# Patient Record
Sex: Male | Born: 1940
Health system: Southern US, Community
[De-identification: ages and names within clinical notes are randomized; demographics above are authoritative.]

## PROBLEM LIST (undated history)

## (undated) DIAGNOSIS — T7840XA Allergy, unspecified, initial encounter: Secondary | ICD-10-CM

## (undated) DIAGNOSIS — M1990 Unspecified osteoarthritis, unspecified site: Secondary | ICD-10-CM

## (undated) DIAGNOSIS — N400 Enlarged prostate without lower urinary tract symptoms: Secondary | ICD-10-CM

## (undated) DIAGNOSIS — K635 Polyp of colon: Secondary | ICD-10-CM

## (undated) DIAGNOSIS — K219 Gastro-esophageal reflux disease without esophagitis: Secondary | ICD-10-CM

## (undated) DIAGNOSIS — I10 Essential (primary) hypertension: Secondary | ICD-10-CM

## (undated) HISTORY — DX: Polyp of colon: K63.5

## (undated) HISTORY — DX: Unspecified osteoarthritis, unspecified site: M19.90

## (undated) HISTORY — PX: CATARACT EXTRACTION: SUR2

## (undated) HISTORY — DX: Gastro-esophageal reflux disease without esophagitis: K21.9

## (undated) HISTORY — PX: OTHER SURGICAL HISTORY: SHX169

## (undated) HISTORY — DX: Benign prostatic hyperplasia without lower urinary tract symptoms: N40.0

## (undated) HISTORY — DX: Allergy, unspecified, initial encounter: T78.40XA

---

## 2004-04-19 ENCOUNTER — Ambulatory Visit: Payer: Self-pay | Admitting: Internal Medicine

## 2004-05-01 ENCOUNTER — Ambulatory Visit: Payer: Self-pay | Admitting: Internal Medicine

## 2004-05-10 ENCOUNTER — Ambulatory Visit: Payer: Self-pay | Admitting: Internal Medicine

## 2004-06-16 ENCOUNTER — Ambulatory Visit: Payer: Self-pay | Admitting: Internal Medicine

## 2005-04-24 ENCOUNTER — Ambulatory Visit: Payer: Self-pay | Admitting: Internal Medicine

## 2006-04-09 ENCOUNTER — Ambulatory Visit: Payer: Self-pay | Admitting: Internal Medicine

## 2006-04-11 ENCOUNTER — Ambulatory Visit: Payer: Self-pay | Admitting: Internal Medicine

## 2007-04-17 ENCOUNTER — Ambulatory Visit: Payer: Self-pay | Admitting: Internal Medicine

## 2007-04-17 DIAGNOSIS — Z860100 Personal history of colon polyps, unspecified: Secondary | ICD-10-CM | POA: Insufficient documentation

## 2007-04-17 DIAGNOSIS — J301 Allergic rhinitis due to pollen: Secondary | ICD-10-CM

## 2008-04-20 ENCOUNTER — Ambulatory Visit: Payer: Self-pay | Admitting: Internal Medicine

## 2008-04-20 DIAGNOSIS — M1990 Unspecified osteoarthritis, unspecified site: Secondary | ICD-10-CM | POA: Insufficient documentation

## 2008-04-20 DIAGNOSIS — M48061 Spinal stenosis, lumbar region without neurogenic claudication: Secondary | ICD-10-CM

## 2009-03-24 ENCOUNTER — Encounter (INDEPENDENT_AMBULATORY_CARE_PROVIDER_SITE_OTHER): Payer: Self-pay | Admitting: *Deleted

## 2009-04-29 ENCOUNTER — Ambulatory Visit: Payer: Self-pay | Admitting: Internal Medicine

## 2009-05-28 HISTORY — PX: COLONOSCOPY: SHX174

## 2009-06-20 ENCOUNTER — Encounter (INDEPENDENT_AMBULATORY_CARE_PROVIDER_SITE_OTHER): Payer: Self-pay | Admitting: *Deleted

## 2009-07-06 ENCOUNTER — Encounter (INDEPENDENT_AMBULATORY_CARE_PROVIDER_SITE_OTHER): Payer: Self-pay | Admitting: *Deleted

## 2009-07-07 ENCOUNTER — Ambulatory Visit: Payer: Self-pay | Admitting: Internal Medicine

## 2009-07-18 ENCOUNTER — Telehealth: Payer: Self-pay | Admitting: Internal Medicine

## 2009-07-21 ENCOUNTER — Ambulatory Visit: Payer: Self-pay | Admitting: Internal Medicine

## 2009-07-21 LAB — HM COLONOSCOPY

## 2009-07-24 ENCOUNTER — Encounter: Payer: Self-pay | Admitting: Internal Medicine

## 2009-07-27 ENCOUNTER — Ambulatory Visit: Payer: Self-pay | Admitting: Ophthalmology

## 2010-03-16 ENCOUNTER — Encounter: Payer: Self-pay | Admitting: Internal Medicine

## 2010-05-05 ENCOUNTER — Ambulatory Visit: Payer: Self-pay | Admitting: Internal Medicine

## 2010-05-05 ENCOUNTER — Encounter: Payer: Self-pay | Admitting: Internal Medicine

## 2010-05-05 DIAGNOSIS — R079 Chest pain, unspecified: Secondary | ICD-10-CM

## 2010-05-06 LAB — CONVERTED CEMR LAB
ALT: 17 units/L (ref 0–53)
Albumin: 4 g/dL (ref 3.5–5.2)
Basophils Relative: 0.5 % (ref 0.0–3.0)
CO2: 31 meq/L (ref 19–32)
Calcium: 8.8 mg/dL (ref 8.4–10.5)
Creatinine, Ser: 0.9 mg/dL (ref 0.4–1.5)
Eosinophils Absolute: 0.1 10*3/uL (ref 0.0–0.7)
Eosinophils Relative: 1.5 % (ref 0.0–5.0)
Glucose, Bld: 95 mg/dL (ref 70–99)
HCT: 39.8 % (ref 39.0–52.0)
Lymphs Abs: 1.5 10*3/uL (ref 0.7–4.0)
MCHC: 34.3 g/dL (ref 30.0–36.0)
MCV: 100 fL (ref 78.0–100.0)
Monocytes Absolute: 0.8 10*3/uL (ref 0.1–1.0)
Neutrophils Relative %: 56.8 % (ref 43.0–77.0)
Platelets: 218 10*3/uL (ref 150.0–400.0)
Sodium: 138 meq/L (ref 135–145)
Total Protein: 7 g/dL (ref 6.0–8.3)
WBC: 5.6 10*3/uL (ref 4.5–10.5)

## 2010-06-25 LAB — CONVERTED CEMR LAB
AST: 27 units/L (ref 0–37)
Albumin: 4 g/dL (ref 3.5–5.2)
Alkaline Phosphatase: 83 units/L (ref 39–117)
BUN: 12 mg/dL (ref 6–23)
Basophils Relative: 0.7 % (ref 0.0–1.0)
Bilirubin, Direct: 0.1 mg/dL (ref 0.0–0.3)
CO2: 31 meq/L (ref 19–32)
Calcium: 9.2 mg/dL (ref 8.4–10.5)
Eosinophils Relative: 1.5 % (ref 0.0–5.0)
GFR calc Af Amer: 108 mL/min
GFR calc non Af Amer: 89 mL/min
HCT: 41.1 % (ref 39.0–52.0)
HDL: 38.5 mg/dL — ABNORMAL LOW (ref >39.0)
Hemoglobin: 14.3 g/dL (ref 13.0–17.0)
LDL Cholesterol: 127 mg/dL — ABNORMAL HIGH (ref 0–99)
Lymphocytes Relative: 29 % (ref 12.0–46.0)
Monocytes Absolute: 0.8 10*3/uL — ABNORMAL HIGH (ref 0.2–0.7)
Monocytes Relative: 15.9 % — ABNORMAL HIGH (ref 3.0–11.0)
Neutro Abs: 2.8 10*3/uL (ref 1.4–7.7)
Neutrophils Relative %: 52.9 % (ref 43.0–77.0)
PSA: 0.35 ng/mL (ref 0.10–4.00)
Phosphorus: 2.9 mg/dL (ref 2.3–4.6)
Potassium: 4.1 meq/L (ref 3.5–5.1)
RDW: 12 % (ref 11.5–14.6)
TSH: 1.13 microintl units/mL (ref 0.35–5.50)
Total Bilirubin: 0.8 mg/dL (ref 0.3–1.2)
Total Protein: 7.4 g/dL (ref 6.0–8.3)
VLDL: 20 mg/dL (ref 0–40)

## 2010-06-28 NOTE — Miscellaneous (Signed)
Summary: FLU VACCINE  Clinical Lists Changes  Observations: Added new observation of FLU VAX: Historical (02/23/2010 14:49)      Influenza Immunization History:    Influenza # 1:  Historical (02/23/2010)

## 2010-06-28 NOTE — Miscellaneous (Signed)
Summary: [facility] Previsit/prep  Clinical Lists Changes  Medications: Added new medication of MOVIPREP 100 GM  SOLR (PEG-KCL-NACL-NASULF-NA ASC-C) As per prep instructions. - Signed Rx of MOVIPREP 100 GM  SOLR (PEG-KCL-NACL-NASULF-NA ASC-C) As per prep instructions.;  #1 x 0;  Signed;  Entered by: Wyona Almas RN;  Authorized by: Iva Boop MD, [facility];  Method used: Electronically to [id number] [address], [place], [place]  [address], Ph: [phone], Fax: [phone] Observations: Added new observation of ALLERGY REV: Done (07/07/2009 13:22)    Prescriptions: MOVIPREP 100 GM  SOLR (PEG-KCL-NACL-NASULF-NA ASC-C) As per prep instructions.  #1 x 0   Entered by:   Wyona Almas RN   Authorized by:   Iva Boop MD, [facility]   Signed by:   Wyona Almas RN on 07/07/2009   Method used:   Electronically to        [id number] ([facility]       [address]       [place], [place]  [address]       Ph: [phone]       Fax: [phone]   RxID:   [id number]

## 2010-06-28 NOTE — Assessment & Plan Note (Signed)
Summary: F/U/CLE   Vital Signs:  Patient profile:   70 year old male Weight:      209 pounds BMI:     31.89 Temp:     98.7 degrees F oral Pulse rate:   72 / minute Pulse rhythm:   regular BP sitting:   150 / 80  (left arm) Cuff size:   large  Vitals Entered By: Mervin Hack CMA Duncan Dull) (May 05, 2010 9:22 AM) CC: follow-up visit   History of Present Illness: Doing fairly well  Will have intermittent back pain He relates this to standing on concrete for 8 or more hours while at work he does walk a lot while there No pain when he is walking picking vegetables Now wears good support shoes from sports store Doesn't take any meds for this--not that severe  Occ pain in arch of feet as well Occ knee pain also but no weakness  Stress dealing with 63 year old dad still lives alone He has to spend time helping him Has mild dementia  No recent allergy problems uses the loratadine in season when needed  has occ chest pain Mild and at rest has happened several times in the past year No change in chronic SOB when he gets the pain Lasts a few seconds No diaphoresis or nausea  Allergies: 1)  ! Sulfa  Past History:  Past medical, surgical, family and social histories (including risk factors) reviewed for relevance to current acute and chronic problems.  Past Medical History: Reviewed history from 04/20/2008 and no changes required. Allergic rhinitis--hay fever Colonic polyps--adenomatous Osteoarthritis  Family History: Reviewed history from 04/20/2008 and no changes required. Dad healthy in 70's Mom died @72  brain aneurysm--died 3 years after resection Mat uncle died of leukemia, another uncle had some other cancer No CAD, DM Mom had HTN No colon or prostate cancer  Social History: Reviewed history from 04/17/2007 and no changes required. 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  The patient denies syncope and abdominal pain.         weight down a little sleeps okay occ mild dizziness  Physical Exam  General:  alert and normal appearance.   Neck:  supple, no masses, no thyromegaly, no carotid bruits, and no cervical lymphadenopathy.   Lungs:  normal respiratory effort, no intercostal retractions, no accessory muscle use, and normal breath sounds.   Heart:  normal rate, regular rhythm, no murmur, and no gallop.   Abdomen:  soft, non-tender, and no masses.   Msk:  no joint tenderness and no joint swelling.   Extremities:  no edema Psych:  normally interactive, good eye contact, not anxious appearing, and not depressed appearing.     Impression & Recommendations:  Problem # 1:  CHEST PAIN (ICD-786.50) Assessment New  has had atypical symptoms Not worrisome discussed worrisome symptoms  Orders: TLB-Renal Function Panel (80069-RENAL) TLB-CBC Platelet - w/Differential (85025-CBCD) TLB-Hepatic/Liver Function Pnl (80076-HEPATIC) TLB-TSH (Thyroid Stimulating Hormone) (84443-TSH) Venipuncture (65784) EKG w/ Interpretation (93000)  Problem # 2:  OSTEOARTHRITIS (ICD-715.90) Assessment: Unchanged episodic back and joint pains he doesn't take meds in general  Problem # 3:  ALLERGIC RHINITIS (ICD-477.9) Assessment: Unchanged stable uses meds as needed   His updated medication list for this problem includes:    Loratadine 10 Mg Tabs (Loratadine) .Marland Kitchen... Take one by  mouth daily as needed  Complete Medication List: 1)  Adult Aspirin Low Strength 81 Mg Tbdp (Aspirin) .... Take 1 tablet by mouth once a day 2)  Loratadine 10 Mg Tabs (Loratadine) .... Take one by mouth daily as needed 3)  Vitamin C 500 Mg Tabs (Ascorbic acid) .... Once daily 4)  Vitamin B-12 1000 Mcg Tabs (Cyanocobalamin) .... Once daily 5)  Vitamin D 1000 Unit Tabs (Cholecalciferol) .... Once  daily  Other Orders: TD Toxoids IM 7 YR + (04540) Admin 1st Vaccine (98119)  Patient Instructions: 1)  Please schedule initial Medicare wellness exam in October   Orders Added: 1)  TD Toxoids IM 7 YR + [90714] 2)  Admin 1st Vaccine [90471] 3)  TLB-Renal Function Panel [80069-RENAL] 4)  TLB-CBC Platelet - w/Differential [85025-CBCD] 5)  TLB-Hepatic/Liver Function Pnl [80076-HEPATIC] 6)  TLB-TSH (Thyroid Stimulating Hormone) [84443-TSH] 7)  Venipuncture [14782] 8)  EKG w/ Interpretation [93000] 9)  Est. Patient Level IV [95621]   Immunizations Administered:  Tetanus Vaccine:    Vaccine Type: Td    Site: right deltoid    Mfr: Sanofi Pasteur    Dose: 0.5 ml    Route: IM    Given by: Mervin Hack CMA (AAMA)    Exp. Date: 06/29/2011    Lot #: [id number]    VIS given: 04/14/08 version given May 05, 2010.   Immunizations Administered:  Tetanus Vaccine:    Vaccine Type: Td    Site: right deltoid    Mfr: Sanofi Pasteur    Dose: 0.5 ml    Route: IM    Given by: Mervin Hack CMA (AAMA)    Exp. Date: 06/29/2011    Lot #: [id number]    VIS given: 04/14/08 version given May 05, 2010.  Current Allergies (reviewed today): ! SULFA    Appended Document: F/U/CLE discussed PSA and will not do anymore

## 2010-06-28 NOTE — Letter (Signed)
Summary: Previsit letter  [facility]  [address] [place], [place] [address]   Phone: [phone]  Fax: [phone]       06/20/2009 MRN: [id number]  [facility] [address] [place], [place]  [address]  Dear Robert Vega,  Welcome to the Gastroenterology Division at [facility].    You are scheduled to see a nurse for your pre-procedure visit on 07-07-09 at 1:30p.m. on the 3rd floor at [facility], [address]  We ask that you try to arrive at our office 15 minutes prior to your appointment time to allow for check-in.  Your nurse visit will consist of discussing your medical and surgical history, your immediate family medical history, and your medications.    Please bring a complete list of all your medications or, if you prefer, bring the medication bottles and we will list them.  We will need to be aware of both prescribed and over the counter drugs.  We will need to know exact dosage information as well.  If you are on blood thinners (Coumadin, Plavix, Aggrenox, Ticlid, etc.) please call our office today/prior to your appointment, as we need to consult with your physician about holding your medication.   Please be prepared to read and sign documents such as consent forms, a financial agreement, and acknowledgement forms.  If necessary, and with your consent, a friend or relative is welcome to sit-in on the nurse visit with you.  Please bring your insurance card so that we may make a copy of it.  If your insurance requires a referral to see a specialist, please bring your referral form from your primary care physician.  No co-pay is required for this nurse visit.     If you cannot keep your appointment, please call [phone] to cancel or reschedule prior to your appointment date.  This allows [place] the opportunity to schedule an appointment for another patient in need of care.    Thank you for choosing [facility] for your medical  needs.  We appreciate the opportunity to care for you.  Please visit [place] at our website  to learn more about our practice.                     Sincerely.                                                                                                                   The Gastroenterology Division

## 2010-06-28 NOTE — Progress Notes (Signed)
Summary: Prep ?'s  Phone Note Call from Patient Call back at Home Phone [phone]   Caller: Patient Call For: Dr. Leone Payor Reason for Call: Talk to Nurse Summary of Call: prep ?'s Initial call taken by: Vallarie Mare,  July 18, 2009 9:55 AM  Follow-up for Phone Call        pt"s questions were answered Follow-up by: Ezra Sites RN,  July 18, 2009 10:01 AM

## 2010-06-28 NOTE — Letter (Signed)
Summary: [facility] Instructions  [facility]  [address] [place], [place] [address]   Phone: [phone]  Fax: [phone]       KRISTOFER SCHAFFERT    [birth year]    MRN: [id number]        Procedure Day /Date:  Thursday  07/21/09     Arrival Time:  10:30am      Procedure Time:  11:30am     Location of Procedure:                    _X _  [facility] (4th Floor)                        PREPARATION FOR COLONOSCOPY WITH MOVIPREP   Starting 5 days prior to your procedure   Saturday 02/19  do not eat nuts, seeds, popcorn, corn, beans, peas,  salads, or any raw vegetables.  Do not take any fiber supplements (e.g. Metamucil, Citrucel, and Benefiber).  THE DAY BEFORE YOUR PROCEDURE         DATE:  02/23  DAY:  Wednesday  1.  Drink clear liquids the entire day-NO SOLID FOOD  2.  Do not drink anything colored red or purple.  Avoid juices with pulp.  No orange juice.  3.  Drink at least 64 oz. (8 glasses) of fluid/clear liquids during the day to prevent dehydration and help the prep work efficiently.  CLEAR LIQUIDS INCLUDE: Water Jello Ice Popsicles Tea (sugar ok, no milk/cream) Powdered fruit flavored drinks Coffee (sugar ok, no milk/cream) Gatorade Juice: apple, white grape, white cranberry  Lemonade Clear bullion, consomm, broth Carbonated beverages (any kind) Strained chicken noodle soup Hard Candy                             4.  In the morning, mix first dose of MoviPrep solution:    Empty 1 Pouch A and 1 Pouch B into the disposable container    Add lukewarm drinking water to the top line of the container. Mix to dissolve    Refrigerate (mixed solution should be used within 24 hrs)  5.  Begin drinking the prep at 5:00 p.m. The MoviPrep container is divided by 4 marks.   Every 15 minutes drink the solution down to the next mark (approximately 8 oz) until the full liter is complete.   6.  Follow completed prep with 16 oz of clear liquid of  your choice (Nothing red or purple).  Continue to drink clear liquids until bedtime.  7.  Before going to bed, mix second dose of MoviPrep solution:    Empty 1 Pouch A and 1 Pouch B into the disposable container    Add lukewarm drinking water to the top line of the container. Mix to dissolve    Refrigerate  THE DAY OF YOUR PROCEDURE      DATE:  02/24  DAY: Thursday  Beginning at  6:30 a.m. (5 hours before procedure):         1. Every 15 minutes, drink the solution down to the next mark (approx 8 oz) until the full liter is complete.  2. Follow completed prep with 16 oz. of clear liquid of your choice.    3. You may drink clear liquids until  9:30am (2 HOURS BEFORE PROCEDURE).   MEDICATION INSTRUCTIONS  Unless otherwise instructed, you should take regular prescription medications with a small  sip of water   as early as possible the morning of your procedure.         OTHER INSTRUCTIONS  You will need a responsible adult at least 72 years of age to accompany you and drive you home.   This person must remain in the waiting room during your procedure.  Wear loose fitting clothing that is easily removed.  Leave jewelry and other valuables at home.  However, you may wish to bring a book to read or  an iPod/MP3 player to listen to music as you wait for your procedure to start.  Remove all body piercing jewelry and leave at home.  Total time from sign-in until discharge is approximately 2-3 hours.  You should go home directly after your procedure and rest.  You can resume normal activities the  day after your procedure.  The day of your procedure you should not:   Drive   Make legal decisions   Operate machinery   Drink alcohol   Return to work  You will receive specific instructions about eating, activities and medications before you leave.    The above instructions have been reviewed and explained to me by   Wyona Almas RN  July 07, 2009 2:13  PM     I fully understand and can verbalize these instructions _____________________________ Date _________

## 2010-06-28 NOTE — Procedures (Signed)
Summary: Colonoscopy  Patient: [name] Note: All result statuses are Final unless otherwise noted.  Tests: (1) Colonoscopy (COL)   COL Colonoscopy           DONE     [facility]     [address]     [place], [place]  [address]           COLONOSCOPY PROCEDURE REPORT           PATIENT:  Robert Vega, Robert Vega  MR#:  [id number]     BIRTHDATE:  [birth year], 68 yrs. old  GENDER:  male           ENDOSCOPIST:  Iva Boop, MD, [facility]           PROCEDURE DATE:  07/21/2009     PROCEDURE:  Colonoscopy with snare polypectomy     ASA CLASS:  Class I     INDICATIONS:  history of pre-cancerous (adenomatous) colon polyps     diminutive adenoma removed 12/05           MEDICATIONS:   Fentanyl 50 mcg IV, Versed 7 mg IV           DESCRIPTION OF PROCEDURE:   After the risks benefits and     alternatives of the procedure were thoroughly explained, informed     consent was obtained.  Digital rectal exam was performed and     revealed no abnormalities and normal prostate.   The LB [id number]     [id number] endoscope was introduced through the anus and advanced to     the cecum, which was identified by both the appendix and ileocecal     valve, without limitations.  The quality of the prep was     excellent, using MoviPrep.  The instrument was then slowly     withdrawn as the colon was fully examined.     Insertion: 3:26 minutes Withdrawal: 11:22 minutes     <<PROCEDUREIMAGES>>           FINDINGS:  A diminutive polyp was found in the sigmoid colon. It     was 4 mm in size. Polyp was snared without cautery. Retrieval was     successful. snare polyp  This was otherwise a normal examination     of the colon.   Retroflexed views in the rectum revealed internal     hemorrhoids.    The scope was then withdrawn from the patient and     the procedure completed.           COMPLICATIONS:  None           ENDOSCOPIC IMPRESSION:     1) 4 mm diminutive polyp in the sigmoid colon - removed     2)  Internal hemorrhoids     3) Otherwise normal examination - excellent prep           4) prior diminutive adenoma removed 12/05           REPEAT EXAM:  In for Colonoscopy, pending biopsy results. if this     is hyperplastic could be 10 yr interval as last adenomatous polyp     (only 1) was diminutive           Iva Boop, MD, [facility]           CC:  The Patient           n.     eSIGNED:   Iva Boop at 07/21/2009  12:30 PM           Caz, Weaver, [id number]  Note: An exclamation mark (!) indicates a result that was not dispersed into the flowsheet. Document Creation Date: 07/21/2009 12:31 PM _______________________________________________________________________  (1) Order result status: Final Collection or observation date-time: 07/21/2009 12:20 Requested date-time:  Receipt date-time:  Reported date-time:  Referring Physician:   Ordering Physician: Stan Head [id number]) Specimen Source:  Source: Launa Grill Order Number: [id number] Lab site:   Appended Document: Colonoscopy CORRECTION: COLONOSCOPY RECALL 7 YEARS  Appended Document: Colonoscopy     Procedures Next Due Date:    Colonoscopy: 07/2014  Appended Document: Colonoscopy     Procedures Next Due Date:    Colonoscopy: 06/2016

## 2010-06-28 NOTE — Letter (Signed)
Summary: Patient Notice-Hyperplastic Polyps + hx dimin adenoma  [facility]  [address] [place], [place] [address]   Phone: [phone]  Fax: [phone]        July 24, 2009 MRN: [id number]    Robert Vega [address] [place], [place]  [address]    Dear Robert Vega,  I am pleased to inform you that the colon polyp removed during your recent colonoscopy was  found to be hyperplastic.  These types of polyps are NOT pre-cancerous.  You did have a tiny adenomatous (pre-cancerous) polyp at your prior colonoscopy about 5 years ago. Current guidelines indicate that your next routine colonoscopy should be in the renge of 5-10 years, like we discussed prior to your recent colonoscopy.  It is therefore my recommendation that you have a routine repeat colonoscopy examination in approximately 7 years for colorectal cancer screening. I am splitting the difference some, here.  In addition to repeating colonoscopy, changing health habits may reduce your risk of having more colon polyps and possibly, colon cancer. You may lower your risk of future polyps and colon cancer by adopting healthy habits such as not smoking or using tobacco (if you do), being physically active, losing weight (if overweight), and eating a diet which includes fruits and vegetables and limits red meat.   Please call us if you are having persistent problems or have questions about your condition that have not been fully answered at this time.  Sincerely,  Iva Boop MD, [facility] This letter has been electronically signed by your physician.

## 2011-01-18 ENCOUNTER — Ambulatory Visit (INDEPENDENT_AMBULATORY_CARE_PROVIDER_SITE_OTHER): Payer: Medicare Other | Admitting: Internal Medicine

## 2011-01-18 ENCOUNTER — Encounter: Payer: Self-pay | Admitting: Internal Medicine

## 2011-01-18 DIAGNOSIS — R509 Fever, unspecified: Secondary | ICD-10-CM

## 2011-01-18 NOTE — Progress Notes (Signed)
  Subjective:    Patient ID: Robert Vega, male    DOB: [birth year], 70 y.o.   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    Allergies  Allergen Reactions  . Sulfonamide Derivatives     Past Medical History  Diagnosis Date  . Allergy   . Arthritis   . Colonic polyp     No past surgical history on file.  Family History  Problem Relation Age of Onset  . Hypertension Mother   . Healthy Father   . Heart disease Neg Hx   . Diabetes Neg Hx   . Cancer Neg Hx     History   Social History  . Marital Status: Single    Spouse Name: N/A    Number of Children: 2  . Years of Education: N/A   Occupational History  . retired--Sales Quarry manager    Social History Main Topics  . Smoking status: Current Everyday Smoker    Types: Pipe, Cigars  . Smokeless tobacco: Never Used  . Alcohol Use: Yes  . Drug Use: No  . Sexually Active: Not on file   Other Topics Concern  . Not on file   Social History Narrative  . No narrative on file    Review of Systems No stomach pain No diarrhea No vomiting No urinary problems like dysuria. Occ frequency though (not new)    Objective:   Physical Exam  Constitutional: He appears well-developed and well-nourished. No distress.  HENT:  Head: Normocephalic and atraumatic.    Right Ear: External ear normal.  Left Ear: External ear normal.  Mouth/Throat: Oropharynx is clear and moist. No oropharyngeal exudate.       Cerumen blocking left TM Right is normal  Neck: Normal range of motion. Neck supple.  Cardiovascular: Normal rate, regular rhythm and normal heart sounds.   No murmur heard. Pulmonary/Chest: Effort normal and breath sounds normal. No respiratory distress. He has no wheezes. He has no rales.  Abdominal: Soft. Bowel sounds are normal. He exhibits no distension and no mass. There is no tenderness.  Musculoskeletal: Normal range of motion. He exhibits no edema and no tenderness.  Lymphadenopathy:    He has no cervical adenopathy.    He has no axillary adenopathy.       Right: No inguinal adenopathy present.       Left: No inguinal adenopathy present.  Skin: No rash noted.  Psychiatric: He has a normal mood and affect. His behavior is normal. Judgment and thought content normal.          Assessment & Plan:

## 2011-01-18 NOTE — Assessment & Plan Note (Signed)
Sounds like viral infection Discussed possibility of West Nile virus---assured that it is mild if that is what this is No evidence of bacterial infection that requires antibiotics  Will continue supportive care Recheck if worsens

## 2011-02-27 ENCOUNTER — Other Ambulatory Visit: Payer: Self-pay

## 2011-03-06 ENCOUNTER — Encounter: Payer: Self-pay | Admitting: Internal Medicine

## 2011-03-06 ENCOUNTER — Ambulatory Visit (INDEPENDENT_AMBULATORY_CARE_PROVIDER_SITE_OTHER): Payer: Medicare Other | Admitting: Internal Medicine

## 2011-03-06 VITALS — BP 141/70 | HR 70 | Temp 97.9°F | Ht 68.0 in | Wt 205.0 lb

## 2011-03-06 DIAGNOSIS — Z0000 Encounter for general adult medical examination without abnormal findings: Secondary | ICD-10-CM

## 2011-03-06 DIAGNOSIS — Z4901 Encounter for fitting and adjustment of extracorporeal dialysis catheter: Secondary | ICD-10-CM | POA: Insufficient documentation

## 2011-03-06 NOTE — Assessment & Plan Note (Signed)
I have personally reviewed the Medicare Annual Wellness questionnaire and have noted 1. The patient's medical and social history 2. Their use of alcohol, tobacco or illicit drugs 3. Their current medications and supplements 4. The patient's functional ability including ADL's, fall risks, home safety risks and hearing or visual             impairment. 5. Diet and physical activities 6. Evidence for depression or mood disorders  The patients weight, height, BMI and visual acuity have been recorded in the chart I have made referrals, counseling and provided education to the patient based review of the above and I have provided the pt with a written personalized care plan for preventive services.  I have provided you with a copy of your personalized plan for preventive services. Please take the time to review along with your updated medication list.  Overall healthy Minor concerns with stress, left arm numbness No testing indicated counselling done

## 2011-03-06 NOTE — Progress Notes (Signed)
Subjective:    Patient ID: Robert Vega, male    DOB: [birth year], 70 y.o.   MRN: [id number]  HPI Here for wellness visit Still smokes pipe twice a day, and cigar while on tractor  Has retired  Had to leave to care for dad more Discussed advanced directives  UTD on colon No PSA appropriate  Does have some hearing problems--mostly if distracting sounds Mild tinnitus as well Vision is fine No falls and low risk  Occ depressed mood---lives alone for years. Cares for dad, etc Believes it is more stress Not anhedonic  No sig memory or thinking problems Does take notes and make lists to keep chores straight, etc  Current Outpatient Prescriptions on File Prior to Visit  Medication Sig Dispense Refill  . aspirin 81 MG tablet Take 81 mg by mouth daily.        . cholecalciferol (VITAMIN D) 1000 UNITS tablet Take 1,000 Units by mouth daily.        Marland Kitchen loratadine (CLARITIN) 10 MG tablet Take 10 mg by mouth daily.        . vitamin B-12 (CYANOCOBALAMIN) 1000 MCG tablet Take 1,000 mcg by mouth daily.        . vitamin C (ASCORBIC ACID) 500 MG tablet Take 500 mg by mouth daily.          Allergies  Allergen Reactions  . Sulfonamide Derivatives     Past Medical History  Diagnosis Date  . Allergy   . Arthritis   . Colonic polyp     No past surgical history on file.  Family History  Problem Relation Age of Onset  . Hypertension Mother   . Healthy Father   . Heart disease Neg Hx   . Diabetes Neg Hx   . Cancer Neg Hx     History   Social History  . Marital Status: Single    Spouse Name: N/A    Number of Children: 2  . Years of Education: N/A   Occupational History  . retired--Sales Quarry manager    Social History Main Topics  . Smoking status: Current Everyday Smoker    Types: Pipe, Cigars  . Smokeless tobacco: Never Used  . Alcohol Use: Yes  . Drug Use: No  . Sexually Active: Not on file   Other Topics Concern  . Not on file   Social History Narrative   Cares for invalid father nowNo living willHas no health care POA--would favor one of his sonsWould accept resuscitation and tube feedings if needed   Review of Systems Sleeps well  Appetite is good Weight stable Only once in a while nocturia--more in the past month Occ brief left arm numbness---not really exertional Stool occ looks more black then brown. No visible blood    Objective:   Physical Exam  Constitutional: He is oriented to person, place, and time. He appears well-developed and well-nourished.  Neck: Normal range of motion. Neck supple. No thyromegaly present.  Cardiovascular: Normal rate, regular rhythm, normal heart sounds and intact distal pulses.  Exam reveals no gallop.   No murmur heard. Pulmonary/Chest: Effort normal and breath sounds normal. No respiratory distress. He has no wheezes. He has no rales.  Abdominal: Soft. There is no tenderness.  Musculoskeletal: He exhibits no edema and no tenderness.  Lymphadenopathy:    He has no cervical adenopathy.  Neurological: He is alert and oriented to person, place, and time. He exhibits normal muscle tone.       President--"Robert Vega,  Robert Vega, Robert Vega [facility] Robert Vega" [phone] D-l-r-o-w Recall 2/3          Assessment & Plan:

## 2011-12-11 DIAGNOSIS — H6120 Impacted cerumen, unspecified ear: Secondary | ICD-10-CM | POA: Diagnosis not present

## 2011-12-11 DIAGNOSIS — K1370 Unspecified lesions of oral mucosa: Secondary | ICD-10-CM | POA: Diagnosis not present

## 2011-12-11 DIAGNOSIS — K219 Gastro-esophageal reflux disease without esophagitis: Secondary | ICD-10-CM | POA: Diagnosis not present

## 2011-12-11 DIAGNOSIS — J342 Deviated nasal septum: Secondary | ICD-10-CM | POA: Diagnosis not present

## 2011-12-31 DIAGNOSIS — K1370 Unspecified lesions of oral mucosa: Secondary | ICD-10-CM | POA: Diagnosis not present

## 2012-02-12 ENCOUNTER — Encounter: Payer: Self-pay | Admitting: Internal Medicine

## 2012-02-12 ENCOUNTER — Ambulatory Visit (INDEPENDENT_AMBULATORY_CARE_PROVIDER_SITE_OTHER): Payer: Medicare Other | Admitting: Internal Medicine

## 2012-02-12 VITALS — BP 140/80 | HR 60 | Temp 98.4°F | Ht 68.0 in | Wt 211.0 lb

## 2012-02-12 DIAGNOSIS — IMO0001 Reserved for inherently not codable concepts without codable children: Secondary | ICD-10-CM | POA: Diagnosis not present

## 2012-02-12 DIAGNOSIS — M791 Myalgia, unspecified site: Secondary | ICD-10-CM | POA: Insufficient documentation

## 2012-02-12 LAB — HEPATIC FUNCTION PANEL
AST: 30 U/L (ref 0–37)
Alkaline Phosphatase: 66 U/L (ref 39–117)
Total Bilirubin: 0.7 mg/dL (ref 0.3–1.2)

## 2012-02-12 LAB — CBC WITH DIFFERENTIAL/PLATELET
Eosinophils Relative: 0.3 % (ref 0.0–5.0)
HCT: 39.2 % (ref 39.0–52.0)
Lymphs Abs: 1.3 10*3/uL (ref 0.7–4.0)
Monocytes Relative: 12.4 % — ABNORMAL HIGH (ref 3.0–12.0)
Neutrophils Relative %: 67.1 % (ref 43.0–77.0)
Platelets: 245 10*3/uL (ref 150.0–400.0)
WBC: 6.7 10*3/uL (ref 4.5–10.5)

## 2012-02-12 LAB — SEDIMENTATION RATE: Sed Rate: 21 mm/hr (ref 0–22)

## 2012-02-12 LAB — CK: Total CK: 122 U/L (ref 7–232)

## 2012-02-12 LAB — BASIC METABOLIC PANEL
BUN: 12 mg/dL (ref 6–23)
GFR: 78.77 mL/min (ref >60.00)
Potassium: 5.2 mEq/L — ABNORMAL HIGH (ref 3.5–5.1)

## 2012-02-12 LAB — TSH: TSH: 0.68 u[IU]/mL (ref 0.35–5.50)

## 2012-02-12 NOTE — Assessment & Plan Note (Signed)
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

## 2012-02-12 NOTE — Progress Notes (Signed)
  Subjective:    Patient ID: Robert Vega, male    DOB: [birth year], 71 y.o.   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 aspirin 81 MG tablet Take 81 mg by mouth daily.        . cholecalciferol (VITAMIN D) 1000 UNITS tablet Take 1,000 Units by mouth daily.        Marland Kitchen omeprazole (PRILOSEC) 40 MG capsule Take 40 mg by mouth daily.      . vitamin B-12 (CYANOCOBALAMIN) 1000 MCG tablet Take 1,000 mcg by mouth daily.        . vitamin C (ASCORBIC ACID) 500 MG tablet Take 500 mg by mouth daily.          Allergies  Allergen Reactions  . Sulfonamide Derivatives     Past Medical History  Diagnosis Date  . Allergy   . Arthritis   . Colonic polyp     No past surgical history on file.  Family History  Problem Relation Age of Onset  . Hypertension Mother   . Healthy Father   . Heart disease Neg Hx   . Diabetes Neg Hx   . Cancer Neg Hx     History   Social History  . Marital Status: Single    Spouse Name: N/A    Number of Children: 2  . Years of Education: N/A   Occupational History  . retired--Sales Quarry manager    Social History Main Topics  . Smoking status: Current Every Day Smoker    Types: Pipe, Cigars  . Smokeless tobacco: Never Used  . Alcohol Use: Yes  . Drug Use: No  . Sexually Active: Not on file   Other Topics Concern  . Not on file   Social History Narrative   Cares for invalid father nowNo living willHas no health care POA--would favor one of his sonsWould accept resuscitation and tube feedings if needed     Review of Systems Slight headaches with this Some blurry vision in left eye---knows there is cataract. No diplopia or unilateral vision loss No distinct weakness     Objective:   Physical Exam  Constitutional: He appears well-developed and well-nourished. No distress.  Neck: Normal range of motion. Neck supple. No thyromegaly present.  Cardiovascular: Normal rate, regular rhythm and normal heart sounds.  Exam reveals no gallop.   No murmur heard. Pulmonary/Chest: Effort normal and breath sounds normal. No respiratory distress. He has no wheezes. He has no rales.  Abdominal: Soft. There is no tenderness.  Musculoskeletal: Normal range of motion. He exhibits no edema and no tenderness.       No muscle tenderness  Lymphadenopathy:    He has no cervical adenopathy.  Neurological:       No weakness Normal gait  Psychiatric: He has a normal mood and affect. His behavior is normal. Thought content normal.          Assessment & Plan:

## 2012-03-11 ENCOUNTER — Encounter: Payer: Medicare Other | Admitting: Internal Medicine

## 2012-03-17 DIAGNOSIS — H2510 Age-related nuclear cataract, unspecified eye: Secondary | ICD-10-CM | POA: Diagnosis not present

## 2012-03-18 ENCOUNTER — Ambulatory Visit (INDEPENDENT_AMBULATORY_CARE_PROVIDER_SITE_OTHER): Payer: Medicare Other | Admitting: Internal Medicine

## 2012-03-18 ENCOUNTER — Encounter: Payer: Self-pay | Admitting: Internal Medicine

## 2012-03-18 VITALS — BP 128/78 | HR 62 | Temp 98.3°F | Ht 70.0 in | Wt 207.0 lb

## 2012-03-18 DIAGNOSIS — Z0000 Encounter for general adult medical examination without abnormal findings: Secondary | ICD-10-CM

## 2012-03-18 DIAGNOSIS — Z23 Encounter for immunization: Secondary | ICD-10-CM | POA: Diagnosis not present

## 2012-03-18 DIAGNOSIS — G479 Sleep disorder, unspecified: Secondary | ICD-10-CM | POA: Insufficient documentation

## 2012-03-18 DIAGNOSIS — K219 Gastro-esophageal reflux disease without esophagitis: Secondary | ICD-10-CM

## 2012-03-18 DIAGNOSIS — N400 Enlarged prostate without lower urinary tract symptoms: Secondary | ICD-10-CM

## 2012-03-18 NOTE — Assessment & Plan Note (Signed)
I have personally reviewed the Medicare Annual Wellness questionnaire and have noted 1. The patient's medical and social history 2. Their use of alcohol, tobacco or illicit drugs 3. Their current medications and supplements 4. The patient's functional ability including ADL's, fall risks, home safety risks and hearing or visual             impairment. 5. Diet and physical activities 6. Evidence for depression or mood disorders  The patients weight, height, BMI and visual acuity have been recorded in the chart I have made referrals, counseling and provided education to the patient based review of the above and I have provided the pt with a written personalized care plan for preventive services.  I have provided you with a copy of your personalized plan for preventive services. Please take the time to review along with your updated medication list.  Got flu shot No zostavax since he didn't have chicken pox Counseled on his pipe/cigar No other action needed

## 2012-03-18 NOTE — Assessment & Plan Note (Signed)
Mild symptoms Not at point of needing meds

## 2012-03-18 NOTE — Progress Notes (Signed)
Subjective:    Patient ID: Robert Vega, male    DOB: [birth year], 71 y.o.   MRN: [id number]  HPI Here for wellness exam and follow up Has cut back on his pipe smoking No alcohol Continues to care for dad who lives 100 yards away. Goes over there 6 times a day. Some mood issues---self pity occ/regret--no sig depression or anhedonia Vision fine Mild hearing problems-- test is stable and within normal range No falls and is stable Stays active with farming Doesn't remember having chicken pox--will hold off on zostavax  Saw Dr Chestine Spore, ENT, for biopsy in mouth (was benign). Diagnosed with LPR and started on omeprazole.  Doesn't note much change and is not sure he will continue.  No sig heartburn or reflux No swallowing problems  No arthritis problems of note Still some muscle aches but better than last visit  Mild pollen sensitivity Hasn't needed any meds  Current Outpatient Prescriptions on File Prior to Visit  Medication Sig Dispense Refill  . aspirin 81 MG tablet Take 81 mg by mouth daily.        Marland Kitchen omeprazole (PRILOSEC) 40 MG capsule Take 40 mg by mouth daily.      . vitamin C (ASCORBIC ACID) 500 MG tablet Take 500 mg by mouth daily.          Allergies  Allergen Reactions  . Sulfonamide Derivatives     Past Medical History  Diagnosis Date  . Allergy   . Arthritis   . Colonic polyp   . GERD (gastroesophageal reflux disease)     Has LPR    Past Surgical History  Procedure Date  . Cataract extraction     2011, other in 2013    Family History  Problem Relation Age of Onset  . Hypertension Mother   . Healthy Father   . Heart disease Neg Hx   . Diabetes Neg Hx   . Cancer Neg Hx     History   Social History  . Marital Status: Single    Spouse Name: N/A    Number of Children: 2  . Years of Education: N/A   Occupational History  . retired--Sales Quarry manager    Social History Main Topics  . Smoking status: Current Every Day Smoker    Types: Pipe,  Cigars  . Smokeless tobacco: Never Used   Comment: pipe and occasional cigar  . Alcohol Use: Yes  . Drug Use: No  . Sexually Active: Not on file   Other Topics Concern  . Not on file   Social History Narrative   Cares for invalid father nowNo living willHas no health care POA--would favor both of his sons equallyWould accept resuscitation and tube feedings if needed   Review of Systems Sleep is not good---worse lately. Initiates fine. Will awaken in 4 hours to void, then can't get back to sleep Discussed trying melatonin Weight is down 4# Bowels are fine Occ daytime urgency and frequency of urine--feels like he is not completely empty Notes that his foreskin will crack at times---still able to retract     Objective:   Physical Exam  Constitutional: He is oriented to person, place, and time. He appears well-developed and well-nourished. No distress.  Neck: Normal range of motion. Neck supple. No thyromegaly present.  Cardiovascular: Normal rate, regular rhythm, normal heart sounds and intact distal pulses.  Exam reveals no gallop.   No murmur heard. Pulmonary/Chest: Effort normal and breath sounds normal. No respiratory distress. He has no  wheezes. He has no rales.  Abdominal: Soft. There is no tenderness.  Musculoskeletal: He exhibits no edema and no tenderness.  Lymphadenopathy:    He has no cervical adenopathy.  Neurological: He is alert and oriented to person, place, and time.       President-- "Rudi Heap, [place]" [phone] D-l-r-o-w Recall 3/3  Skin: No rash noted. No erythema.  Psychiatric: He has a normal mood and affect. His behavior is normal. Thought content normal.          Assessment & Plan:

## 2012-03-18 NOTE — Assessment & Plan Note (Signed)
Short lived Discussed trying melatonin

## 2012-03-18 NOTE — Patient Instructions (Signed)
Please try melatonin 3-5 mg at bedtime if you continue to have sleep problems.

## 2012-03-18 NOTE — Assessment & Plan Note (Signed)
Diagnosed by ENT with LPR---unclear situation Probably will stop the PPI soon since no clear response (and questionable problem)

## 2012-04-07 ENCOUNTER — Ambulatory Visit: Payer: Self-pay | Admitting: Ophthalmology

## 2012-04-07 DIAGNOSIS — Z01810 Encounter for preprocedural cardiovascular examination: Secondary | ICD-10-CM | POA: Diagnosis not present

## 2012-04-07 DIAGNOSIS — H2510 Age-related nuclear cataract, unspecified eye: Secondary | ICD-10-CM | POA: Diagnosis not present

## 2012-04-07 DIAGNOSIS — I499 Cardiac arrhythmia, unspecified: Secondary | ICD-10-CM | POA: Diagnosis not present

## 2012-04-15 ENCOUNTER — Ambulatory Visit: Payer: Self-pay | Admitting: Ophthalmology

## 2012-04-15 DIAGNOSIS — H269 Unspecified cataract: Secondary | ICD-10-CM | POA: Diagnosis not present

## 2012-04-15 DIAGNOSIS — H2510 Age-related nuclear cataract, unspecified eye: Secondary | ICD-10-CM | POA: Diagnosis not present

## 2012-04-15 DIAGNOSIS — N400 Enlarged prostate without lower urinary tract symptoms: Secondary | ICD-10-CM | POA: Diagnosis not present

## 2012-04-15 DIAGNOSIS — K219 Gastro-esophageal reflux disease without esophagitis: Secondary | ICD-10-CM | POA: Diagnosis not present

## 2012-04-15 DIAGNOSIS — Z882 Allergy status to sulfonamides status: Secondary | ICD-10-CM | POA: Diagnosis not present

## 2012-04-15 DIAGNOSIS — F17200 Nicotine dependence, unspecified, uncomplicated: Secondary | ICD-10-CM | POA: Diagnosis not present

## 2012-04-15 DIAGNOSIS — Z79899 Other long term (current) drug therapy: Secondary | ICD-10-CM | POA: Diagnosis not present

## 2012-04-15 DIAGNOSIS — R0609 Other forms of dyspnea: Secondary | ICD-10-CM | POA: Diagnosis not present

## 2012-04-15 DIAGNOSIS — N419 Inflammatory disease of prostate, unspecified: Secondary | ICD-10-CM | POA: Diagnosis not present

## 2012-04-15 DIAGNOSIS — Z7982 Long term (current) use of aspirin: Secondary | ICD-10-CM | POA: Diagnosis not present

## 2012-04-16 DIAGNOSIS — H2510 Age-related nuclear cataract, unspecified eye: Secondary | ICD-10-CM | POA: Diagnosis not present

## 2012-09-03 DIAGNOSIS — C4360 Malignant melanoma of unspecified upper limb, including shoulder: Secondary | ICD-10-CM | POA: Diagnosis not present

## 2012-09-03 DIAGNOSIS — L570 Actinic keratosis: Secondary | ICD-10-CM | POA: Diagnosis not present

## 2012-09-03 DIAGNOSIS — C4359 Malignant melanoma of other part of trunk: Secondary | ICD-10-CM | POA: Diagnosis not present

## 2012-09-03 DIAGNOSIS — L821 Other seborrheic keratosis: Secondary | ICD-10-CM | POA: Diagnosis not present

## 2012-09-09 DIAGNOSIS — L570 Actinic keratosis: Secondary | ICD-10-CM | POA: Diagnosis not present

## 2012-09-09 DIAGNOSIS — C4359 Malignant melanoma of other part of trunk: Secondary | ICD-10-CM | POA: Diagnosis not present

## 2012-09-09 DIAGNOSIS — L821 Other seborrheic keratosis: Secondary | ICD-10-CM | POA: Diagnosis not present

## 2012-09-17 ENCOUNTER — Ambulatory Visit (INDEPENDENT_AMBULATORY_CARE_PROVIDER_SITE_OTHER): Payer: Medicare Other | Admitting: General Surgery

## 2012-09-17 ENCOUNTER — Encounter: Payer: Self-pay | Admitting: General Surgery

## 2012-09-17 VITALS — BP 140/78 | Ht 70.0 in | Wt 219.0 lb

## 2012-09-17 DIAGNOSIS — C439 Malignant melanoma of skin, unspecified: Secondary | ICD-10-CM | POA: Diagnosis not present

## 2012-09-17 DIAGNOSIS — D039 Melanoma in situ, unspecified: Secondary | ICD-10-CM

## 2012-09-17 NOTE — Progress Notes (Signed)
Patient ID: Robert Vega, male   DOB: 01-17-1941, 72 y.o.   MRN: [id number]  Chief Complaint  Patient presents with  . Other    melanoma on left shoulder    HPI Robert Vega is a 72 y.o. male here today for melanoma on left shoulder has been their for about four months.Patient had a biopsy on 09/03/12.  HPI  Past Medical History  Diagnosis Date  . Allergy   . Arthritis   . Colonic polyp   . GERD (gastroesophageal reflux disease)     Has LPR  . BPH (benign prostatic hypertrophy)     Past Surgical History  Procedure Laterality Date  . Cataract extraction      2011, other in 2013  . Colonoscopy  2011    Family History  Problem Relation Age of Onset  . Hypertension Mother   . Healthy Father   . Heart disease Neg Hx   . Diabetes Neg Hx   . Cancer Neg Hx     Social History History  Substance Use Topics  . Smoking status: Current Every Day Smoker -- 2.00 packs/day for 50 years    Types: Pipe  . Smokeless tobacco: Never Used     Comment: pipe and occasional cigar  . Alcohol Use: No    Allergies  Allergen Reactions  . Sulfonamide Derivatives     Current Outpatient Prescriptions  Medication Sig Dispense Refill  . aspirin 81 MG tablet Take 81 mg by mouth daily.        . cholecalciferol (VITAMIN D) 400 UNITS TABS Take 400 Units by mouth daily.      . Multiple Vitamins-Minerals (MULTIVITAMIN WITH MINERALS) tablet Take 1 tablet by mouth daily.      Marland Kitchen omeprazole (PRILOSEC) 40 MG capsule Take 40 mg by mouth daily.      . cyanocobalamin 500 MCG tablet Take 500 mcg by mouth daily.      . vitamin C (ASCORBIC ACID) 500 MG tablet Take 500 mg by mouth daily.         No current facility-administered medications for this visit.    Review of Systems Review of Systems  Constitutional: Negative.   Respiratory: Positive for shortness of breath. Negative for apnea, cough, choking, chest tightness, wheezing and stridor.   Cardiovascular: Negative.     Blood pressure  140/78, height 5\' 10"  (1.778 m), weight 219 lb (99.338 kg).  Physical Exam Physical Exam  Constitutional: He appears well-developed and well-nourished.  Neck: Normal range of motion. Neck supple.  Cardiovascular: Normal rate, regular rhythm and normal heart sounds.   Pulmonary/Chest: Effort normal and breath sounds normal.  Abdominal: Soft. Bowel sounds are normal.  Skin:  Left superior shoulder 15 mm shave biopsy site from the resected melanoma.      Data Reviewed The report dated 09/03/2012 showed melanoma in situ with fibrosis consistent with progressive changes, margins involved. The size of the specimen received by the pathology Department was 2 x 8 x 11 mm.  Assessment    In situ melanoma of the left shoulder.      Plan    Local excision in the near future.         Earline Mayotte 09/18/2012, 7:49 PM  CC: Tillman Abide, M.D.; Lonia Skinner, M.D.

## 2012-09-17 NOTE — Patient Instructions (Addendum)
Return in one month for excision.

## 2012-09-18 ENCOUNTER — Encounter: Payer: Self-pay | Admitting: General Surgery

## 2012-09-18 DIAGNOSIS — D039 Melanoma in situ, unspecified: Secondary | ICD-10-CM | POA: Insufficient documentation

## 2012-10-23 ENCOUNTER — Ambulatory Visit: Payer: Medicare Other | Admitting: General Surgery

## 2012-11-06 ENCOUNTER — Encounter: Payer: Self-pay | Admitting: General Surgery

## 2012-11-06 ENCOUNTER — Ambulatory Visit (INDEPENDENT_AMBULATORY_CARE_PROVIDER_SITE_OTHER): Payer: Medicare Other | Admitting: General Surgery

## 2012-11-06 VITALS — BP 136/88 | HR 64 | Resp 16 | Ht 70.0 in | Wt 213.0 lb

## 2012-11-06 DIAGNOSIS — L905 Scar conditions and fibrosis of skin: Secondary | ICD-10-CM | POA: Diagnosis not present

## 2012-11-06 DIAGNOSIS — C439 Malignant melanoma of skin, unspecified: Secondary | ICD-10-CM | POA: Diagnosis not present

## 2012-11-06 NOTE — Progress Notes (Deleted)
Patient ID: Robert Vega, male   DOB: 1941/04/16, 72 y.o.   MRN: [id number]  Chief Complaint  Patient presents with  . Procedure    excision melanoma    HPI Robert Vega is a 72 y.o. male here today for excision melaonma HPI  Past Medical History  Diagnosis Date  . Allergy   . Arthritis   . Colonic polyp   . GERD (gastroesophageal reflux disease)     Has LPR  . BPH (benign prostatic hypertrophy)     Past Surgical History  Procedure Laterality Date  . Cataract extraction      2011, other in 2013  . Colonoscopy  2011    Family History  Problem Relation Age of Onset  . Hypertension Mother   . Healthy Father   . Heart disease Neg Hx   . Diabetes Neg Hx   . Cancer Neg Hx     Social History History  Substance Use Topics  . Smoking status: Current Every Day Smoker -- 2.00 packs/day for 50 years    Types: Pipe  . Smokeless tobacco: Never Used     Comment: pipe and occasional cigar  . Alcohol Use: No    Allergies  Allergen Reactions  . Sulfonamide Derivatives     Current Outpatient Prescriptions  Medication Sig Dispense Refill  . aspirin 81 MG tablet Take 81 mg by mouth daily.        . cholecalciferol (VITAMIN D) 400 UNITS TABS Take 400 Units by mouth daily.      . cyanocobalamin 500 MCG tablet Take 500 mcg by mouth daily.      . Multiple Vitamins-Minerals (MULTIVITAMIN WITH MINERALS) tablet Take 1 tablet by mouth daily.      Marland Kitchen omeprazole (PRILOSEC) 40 MG capsule Take 40 mg by mouth daily.      . vitamin C (ASCORBIC ACID) 500 MG tablet Take 500 mg by mouth daily.         No current facility-administered medications for this visit.    Review of Systems Review of Systems  Constitutional: Negative.   Respiratory: Negative.   Cardiovascular: Negative.     There were no vitals taken for this visit.  Physical Exam Physical Exam  Data Reviewed ***  Assessment    ***    Plan    ***       Ples Specter 11/06/2012, 10:42 AM

## 2012-11-06 NOTE — Patient Instructions (Addendum)
The patient is aware to call back for any questions or concerns.  Return in 10-11 days for suture removal May shower, Sealed dressing in place If dressing comes off use band aid for comfort

## 2012-11-06 NOTE — Progress Notes (Signed)
Patient ID: Robert Vega, male   DOB: [birth year], 72 y.o.   MRN: [id number]  Chief Complaint  Patient presents with  . Procedure    excision melanoma    HPI Robert Vega is a 72 y.o. male.  Patient here today for formal excision of a previously identified  melanoma in situ on the outer surface of the left shoulder. HPI  Past Medical History  Diagnosis Date  . Allergy   . Arthritis   . Colonic polyp   . GERD (gastroesophageal reflux disease)     Has LPR  . BPH (benign prostatic hypertrophy)     Past Surgical History  Procedure Laterality Date  . Cataract extraction      2011, other in 2013  . Colonoscopy  2011    Family History  Problem Relation Age of Onset  . Hypertension Mother   . Healthy Father   . Heart disease Neg Hx   . Diabetes Neg Hx   . Cancer Neg Hx     Social History History  Substance Use Topics  . Smoking status: Current Every Day Smoker -- 2.00 packs/day for 50 years    Types: Pipe  . Smokeless tobacco: Never Used     Comment: pipe and occasional cigar  . Alcohol Use: No    Allergies  Allergen Reactions  . Sulfonamide Derivatives     Current Outpatient Prescriptions  Medication Sig Dispense Refill  . aspirin 81 MG tablet Take 81 mg by mouth daily.        . fish oil-omega-3 fatty acids 1000 MG capsule Take 1 g by mouth daily.      . Multiple Vitamins-Minerals (MULTIVITAMIN WITH MINERALS) tablet Take 1 tablet by mouth daily.      Marland Kitchen omeprazole (PRILOSEC) 40 MG capsule Take 40 mg by mouth daily.       No current facility-administered medications for this visit.    Review of Systems Review of Systems  Constitutional: Negative.   Respiratory: Negative.   Cardiovascular: Negative.     Blood pressure 136/88, pulse 64, resp. rate 16, height 5\' 10"  (1.778 m), weight 213 lb (96.616 kg).  Physical Exam Physical Exam A 1 cm healing area from his previous shave biopsy is evident on the external surface of the left shoulder.   Data  Reviewed Previous biopsy showed evidence of melanoma in situ.  Assessment    Local excision for melanoma in situ.    Plan    The area was cleansed with alcohol and a total of 18 cc of 0.5% Xylocaine with 0.25% Marcaine with 1-200,000 of epinephrine was utilized for local anesthesia well tolerated. ChloraPrep was applied to the skin. Through a radially based elliptical incision the area of previous shave biopsy was excised down into the subcutaneous tissue. A suture was placed at the 12:00 area for orientation. The deep tissue was approximated with interrupted 3-0 Vicryl sutures. The skin was closed with running 4-0 nylon suture. Telfa and Tegaderm dressing applied.  Wound care was reviewed with the patient. He'll return 11 days for suture removal with the nursing continue careful followup with Dr. Orson Aloe.       Earline Mayotte 11/06/2012, 12:19 PM

## 2012-11-10 LAB — PATHOLOGY

## 2012-11-11 ENCOUNTER — Telehealth: Payer: Self-pay | Admitting: *Deleted

## 2012-11-11 NOTE — Telephone Encounter (Signed)
Notified patient as instructed, patient pleased. Discussed follow-up appointments, for suture removal 11-18-12, patient agrees

## 2012-11-11 NOTE — Telephone Encounter (Signed)
Message copied by Currie Paris on Tue Nov 11, 2012 10:49 AM ------      Message from: Robert Vega      Created: Tue Nov 11, 2012  9:26 AM       Please notify the patient no residual melanoma identified. F/U as scheduled for suture removal. Thanks. ------

## 2012-11-18 ENCOUNTER — Ambulatory Visit (INDEPENDENT_AMBULATORY_CARE_PROVIDER_SITE_OTHER): Payer: Medicare Other | Admitting: *Deleted

## 2012-11-18 DIAGNOSIS — C439 Malignant melanoma of skin, unspecified: Secondary | ICD-10-CM

## 2012-11-18 NOTE — Patient Instructions (Addendum)
Aware of pathology. Patient aware to call back for questions or concerns.

## 2012-11-18 NOTE — Progress Notes (Signed)
The sutures were removed and steri strips applied. No signs of infection noted.

## 2012-12-16 DIAGNOSIS — H353190 Nonexudative age-related macular degeneration, unspecified eye, stage unspecified: Secondary | ICD-10-CM | POA: Diagnosis not present

## 2013-03-04 DIAGNOSIS — Z23 Encounter for immunization: Secondary | ICD-10-CM | POA: Diagnosis not present

## 2013-03-17 DIAGNOSIS — L819 Disorder of pigmentation, unspecified: Secondary | ICD-10-CM | POA: Diagnosis not present

## 2013-03-17 DIAGNOSIS — L821 Other seborrheic keratosis: Secondary | ICD-10-CM | POA: Diagnosis not present

## 2013-03-17 DIAGNOSIS — Z0189 Encounter for other specified special examinations: Secondary | ICD-10-CM | POA: Diagnosis not present

## 2013-03-17 DIAGNOSIS — Z85820 Personal history of malignant melanoma of skin: Secondary | ICD-10-CM | POA: Diagnosis not present

## 2013-03-24 ENCOUNTER — Ambulatory Visit (INDEPENDENT_AMBULATORY_CARE_PROVIDER_SITE_OTHER): Payer: Medicare Other | Admitting: Internal Medicine

## 2013-03-24 ENCOUNTER — Encounter: Payer: Self-pay | Admitting: Internal Medicine

## 2013-03-24 VITALS — BP 110/60 | HR 65 | Temp 97.8°F | Ht 70.0 in | Wt 215.0 lb

## 2013-03-24 DIAGNOSIS — G479 Sleep disorder, unspecified: Secondary | ICD-10-CM

## 2013-03-24 DIAGNOSIS — Z0000 Encounter for general adult medical examination without abnormal findings: Secondary | ICD-10-CM | POA: Diagnosis not present

## 2013-03-24 DIAGNOSIS — M1990 Unspecified osteoarthritis, unspecified site: Secondary | ICD-10-CM | POA: Diagnosis not present

## 2013-03-24 DIAGNOSIS — K219 Gastro-esophageal reflux disease without esophagitis: Secondary | ICD-10-CM

## 2013-03-24 DIAGNOSIS — N400 Enlarged prostate without lower urinary tract symptoms: Secondary | ICD-10-CM | POA: Diagnosis not present

## 2013-03-24 LAB — BASIC METABOLIC PANEL
BUN: 16 mg/dL (ref 6–23)
CO2: 29 mEq/L (ref 19–32)
Calcium: 9.2 mg/dL (ref 8.4–10.5)
Creatinine, Ser: 0.9 mg/dL (ref 0.4–1.5)
GFR: 84.4 mL/min (ref >60.00)
Glucose, Bld: 76 mg/dL (ref 70–99)
Sodium: 137 mEq/L (ref 135–145)

## 2013-03-24 LAB — CBC WITH DIFFERENTIAL/PLATELET
Basophils Absolute: 0 10*3/uL (ref 0.0–0.1)
Eosinophils Absolute: 0 10*3/uL (ref 0.0–0.7)
Lymphocytes Relative: 23.2 % (ref 12.0–46.0)
MCHC: 34 g/dL (ref 30.0–36.0)
Monocytes Relative: 13.1 % — ABNORMAL HIGH (ref 3.0–12.0)
Neutro Abs: 4.7 10*3/uL (ref 1.4–7.7)
Neutrophils Relative %: 62.8 % (ref 43.0–77.0)
Platelets: 263 10*3/uL (ref 150.0–400.0)
RDW: 13.6 % (ref 11.5–14.6)

## 2013-03-24 LAB — HEPATIC FUNCTION PANEL
AST: 27 U/L (ref 0–37)
Albumin: 4.1 g/dL (ref 3.5–5.2)
Alkaline Phosphatase: 83 U/L (ref 39–117)
Total Bilirubin: 0.6 mg/dL (ref 0.3–1.2)

## 2013-03-24 NOTE — Assessment & Plan Note (Signed)
Trouble reintiating after awakening Doesn't favor trying meds

## 2013-03-24 NOTE — Assessment & Plan Note (Signed)
Mild symptoms  No meds needed 

## 2013-03-24 NOTE — Patient Instructions (Addendum)
If your sleep gets worse, you can try over the counter melatonin, 3-6mg  at bedtime.  DASH Diet The DASH diet stands for "Dietary Approaches to Stop Hypertension." It is a healthy eating plan that has been shown to reduce high blood pressure (hypertension) in as little as 14 days, while also possibly providing other significant health benefits. These other health benefits include reducing the risk of breast cancer after menopause and reducing the risk of type 2 diabetes, heart disease, colon cancer, and stroke. Health benefits also include weight loss and slowing kidney failure in patients with chronic kidney disease.  DIET GUIDELINES  Limit salt (sodium). Your diet should contain less than 1500 mg of sodium daily.  Limit refined or processed carbohydrates. Your diet should include mostly whole grains. Desserts and added sugars should be used sparingly.  Include small amounts of heart-healthy fats. These types of fats include nuts, oils, and tub margarine. Limit saturated and trans fats. These fats have been shown to be harmful in the body. CHOOSING FOODS  The following food groups are based on a 2000 calorie diet. See your Registered Dietitian for individual calorie needs. Grains and Grain Products (6 to 8 servings daily)  Eat More Often: Whole-wheat bread, brown rice, whole-grain or wheat pasta, quinoa, popcorn without added fat or salt (air popped).  Eat Less Often: White bread, white pasta, white rice, cornbread. Vegetables (4 to 5 servings daily)  Eat More Often: Fresh, frozen, and canned vegetables. Vegetables may be raw, steamed, roasted, or grilled with a minimal amount of fat.  Eat Less Often/Avoid: Creamed or fried vegetables. Vegetables in a cheese sauce. Fruit (4 to 5 servings daily)  Eat More Often: All fresh, canned (in natural juice), or frozen fruits. Dried fruits without added sugar. One hundred percent fruit juice ( cup [237 mL] daily).  Eat Less Often: Dried fruits with  added sugar. Canned fruit in light or heavy syrup. [employer], Fish, and Poultry (2 servings or less daily. One serving is 3 to 4 oz [85-114 g]).  Eat More Often: Ninety percent or leaner ground beef, tenderloin, sirloin. Round cuts of beef, chicken breast, [place] breast. All fish. Grill, bake, or broil your meat. Nothing should be fried.  Eat Less Often/Avoid: Fatty cuts of meat, [place], or chicken leg, thigh, or wing. Fried cuts of meat or fish. Dairy (2 to 3 servings)  Eat More Often: Low-fat or fat-free milk, low-fat plain or light yogurt, reduced-fat or part-skim cheese.  Eat Less Often/Avoid: Milk (whole, 2%).Whole milk yogurt. Full-fat cheeses. Nuts, Seeds, and Legumes (4 to 5 servings per week)  Eat More Often: All without added salt.  Eat Less Often/Avoid: Salted nuts and seeds, canned beans with added salt. Fats and Sweets (limited)  Eat More Often: Vegetable oils, tub margarines without trans fats, sugar-free gelatin. Mayonnaise and salad dressings.  Eat Less Often/Avoid: Coconut oils, palm oils, butter, stick margarine, cream, half and half, cookies, candy, pie. FOR MORE INFORMATION The Dash Diet Eating Plan: www.dashdiet.org Document Released: 05/03/2011 Document Revised: 08/06/2011 Document Reviewed: 05/03/2011 [facility] Patient Information 2014 [place], [place].

## 2013-03-24 NOTE — Assessment & Plan Note (Signed)
Okay with the PPI Needs to continue given the LPR

## 2013-03-24 NOTE — Assessment & Plan Note (Signed)
Discussed tylenol if shoulders/back are bad enough to need meds

## 2013-03-24 NOTE — Progress Notes (Signed)
Subjective:    Patient ID: Robert Vega, male    DOB: 22-Apr-1941, 72 y.o.   MRN: [id number]  HPI Here for Medicare wellness and follow up Only medical issue was the melanoma in situ Ongoing stress with dad's caregiving--- will affect mood but no anhedonia  Vision okay---mild hearing problems Regular physical activity No alcohol Still occasional pipe and cigar---counseled Occasional mild memory issues. No apraxia Independent with all instrumental ADLs  Did have left arm go through [place] a couple of weeks ago Some scraping up No other falls per se Has ongoing back and shoulder pain Can be a problem mostly in bed---doesn't notice when working Hasn't used any meds  Still has sleep problems Worries about dad Troubles initiating mostly---up after voiding or dad calling and can't go back to sleep Doesn't nap  No stomach problems Uses omeprazole regularly--but forgets it at times  Voids okay Nocturia x 1-2  No sig daytime problems  Current Outpatient Prescriptions on File Prior to Visit  Medication Sig Dispense Refill  . aspirin 81 MG tablet Take 81 mg by mouth daily.        . fish oil-omega-3 fatty acids 1000 MG capsule Take 1 g by mouth daily.      . Multiple Vitamins-Minerals (MULTIVITAMIN WITH MINERALS) tablet Take 1 tablet by mouth daily.      Marland Kitchen omeprazole (PRILOSEC) 40 MG capsule Take 40 mg by mouth daily.       No current facility-administered medications on file prior to visit.    Allergies  Allergen Reactions  . Sulfonamide Derivatives     Past Medical History  Diagnosis Date  . Allergy   . Arthritis   . Colonic polyp   . GERD (gastroesophageal reflux disease)     Has LPR  . BPH (benign prostatic hypertrophy)     Past Surgical History  Procedure Laterality Date  . Cataract extraction      2011, other in 2013  . Colonoscopy  2011    Family History  Problem Relation Age of Onset  . Hypertension Mother   . Healthy Father   . Heart disease  Neg Hx   . Diabetes Neg Hx   . Cancer Neg Hx     History   Social History  . Marital Status: Single    Spouse Name: N/A    Number of Children: 2  . Years of Education: N/A   Occupational History  . retired--Sales Quarry manager    Social History Main Topics  . Smoking status: Current Every Day Smoker -- 2.00 packs/day for 50 years    Types: Pipe  . Smokeless tobacco: Never Used     Comment: pipe and occasional cigar  . Alcohol Use: No  . Drug Use: No  . Sexual Activity: Not on file   Other Topics Concern  . Not on file   Social History Narrative   Cares for invalid father now   No living will   Has no health care POA--would favor both of his sons equally   Would accept resuscitation and tube feedings if needed   Review of Systems Appetite is okay Weight is stable Bowels are fine    Objective:   Physical Exam  Constitutional: He is oriented to person, place, and time. He appears well-developed and well-nourished. No distress.  HENT:  Mouth/Throat: Oropharynx is clear and moist. No oropharyngeal exudate.  Neck: Normal range of motion. Neck supple. No thyromegaly present.  Cardiovascular: Normal rate, regular rhythm, normal  heart sounds and intact distal pulses.  Exam reveals no gallop.   No murmur heard. Pulmonary/Chest: Effort normal and breath sounds normal. No respiratory distress. He has no wheezes. He has no rales.  Abdominal: Soft. There is no tenderness.  Musculoskeletal: He exhibits no edema and no tenderness.  Lymphadenopathy:    He has no cervical adenopathy.  Neurological: He is alert and oriented to person, place, and time.  President "Silvio Clayman [facility]" [phone] D-l-r-o-w Recall 2/3  Skin: No rash noted.  Psychiatric: He has a normal mood and affect. His behavior is normal.          Assessment & Plan:

## 2013-03-24 NOTE — Assessment & Plan Note (Signed)
I have personally reviewed the Medicare Annual Wellness questionnaire and have noted 1. The patient's medical and social history 2. Their use of alcohol, tobacco or illicit drugs 3. Their current medications and supplements 4. The patient's functional ability including ADL's, fall risks, home safety risks and hearing or visual             impairment. 5. Diet and physical activities 6. Evidence for depression or mood disorders  The patients weight, height, BMI and visual acuity have been recorded in the chart I have made referrals, counseling and provided education to the patient based review of the above and I have provided the pt with a written personalized care plan for preventive services.  I have provided you with a copy of your personalized plan for preventive services. Please take the time to review along with your updated medication list.  No cancer screening Info on proper diet

## 2013-05-12 ENCOUNTER — Encounter: Payer: Self-pay | Admitting: Internal Medicine

## 2013-08-31 DIAGNOSIS — Z85820 Personal history of malignant melanoma of skin: Secondary | ICD-10-CM | POA: Diagnosis not present

## 2013-08-31 DIAGNOSIS — L821 Other seborrheic keratosis: Secondary | ICD-10-CM | POA: Diagnosis not present

## 2013-08-31 DIAGNOSIS — L723 Sebaceous cyst: Secondary | ICD-10-CM | POA: Diagnosis not present

## 2013-08-31 DIAGNOSIS — L570 Actinic keratosis: Secondary | ICD-10-CM | POA: Diagnosis not present

## 2013-08-31 DIAGNOSIS — L578 Other skin changes due to chronic exposure to nonionizing radiation: Secondary | ICD-10-CM | POA: Diagnosis not present

## 2014-03-01 DIAGNOSIS — H3531 Nonexudative age-related macular degeneration: Secondary | ICD-10-CM | POA: Diagnosis not present

## 2014-03-09 DIAGNOSIS — Z23 Encounter for immunization: Secondary | ICD-10-CM | POA: Diagnosis not present

## 2014-03-10 DIAGNOSIS — K136 Irritative hyperplasia of oral mucosa: Secondary | ICD-10-CM | POA: Diagnosis not present

## 2014-03-10 DIAGNOSIS — K1321 Leukoplakia of oral mucosa, including tongue: Secondary | ICD-10-CM | POA: Diagnosis not present

## 2014-03-18 DIAGNOSIS — K136 Irritative hyperplasia of oral mucosa: Secondary | ICD-10-CM | POA: Diagnosis not present

## 2014-03-18 DIAGNOSIS — K1321 Leukoplakia of oral mucosa, including tongue: Secondary | ICD-10-CM | POA: Diagnosis not present

## 2014-03-26 ENCOUNTER — Encounter: Payer: Medicare Other | Admitting: Internal Medicine

## 2014-04-05 ENCOUNTER — Encounter: Payer: Self-pay | Admitting: Internal Medicine

## 2014-04-05 ENCOUNTER — Ambulatory Visit (INDEPENDENT_AMBULATORY_CARE_PROVIDER_SITE_OTHER): Payer: Medicare Other | Admitting: Internal Medicine

## 2014-04-05 VITALS — BP 128/80 | HR 60 | Temp 98.2°F | Ht 70.0 in | Wt 211.0 lb

## 2014-04-05 DIAGNOSIS — Z7189 Other specified counseling: Secondary | ICD-10-CM | POA: Insufficient documentation

## 2014-04-05 DIAGNOSIS — Z0000 Encounter for general adult medical examination without abnormal findings: Secondary | ICD-10-CM | POA: Diagnosis not present

## 2014-04-05 DIAGNOSIS — K219 Gastro-esophageal reflux disease without esophagitis: Secondary | ICD-10-CM

## 2014-04-05 DIAGNOSIS — N400 Enlarged prostate without lower urinary tract symptoms: Secondary | ICD-10-CM | POA: Diagnosis not present

## 2014-04-05 DIAGNOSIS — D039 Melanoma in situ, unspecified: Secondary | ICD-10-CM | POA: Diagnosis not present

## 2014-04-05 DIAGNOSIS — M47819 Spondylosis without myelopathy or radiculopathy, site unspecified: Secondary | ICD-10-CM | POA: Diagnosis not present

## 2014-04-05 LAB — CBC WITH DIFFERENTIAL/PLATELET
BASOS ABS: 0 10*3/uL (ref 0.0–0.1)
Basophils Relative: 0.4 % (ref 0.0–3.0)
Eosinophils Absolute: 0.1 10*3/uL (ref 0.0–0.7)
Eosinophils Relative: 0.8 % (ref 0.0–5.0)
HEMATOCRIT: 40.5 % (ref 39.0–52.0)
Hemoglobin: 13.6 g/dL (ref 13.0–17.0)
LYMPHS ABS: 1.9 10*3/uL (ref 0.7–4.0)
Lymphocytes Relative: 26 % (ref 12.0–46.0)
MCHC: 33.6 g/dL (ref 30.0–36.0)
MCV: 97 fl (ref 78.0–100.0)
MONO ABS: 0.8 10*3/uL (ref 0.1–1.0)
Monocytes Relative: 11.1 % (ref 3.0–12.0)
Neutro Abs: 4.4 10*3/uL (ref 1.4–7.7)
Neutrophils Relative %: 61.7 % (ref 43.0–77.0)
PLATELETS: 242 10*3/uL (ref 150.0–400.0)
RBC: 4.18 Mil/uL — ABNORMAL LOW (ref 4.22–5.81)
RDW: 13.3 % (ref 11.5–15.5)
WBC: 7.2 10*3/uL (ref 4.0–10.5)

## 2014-04-05 LAB — COMPREHENSIVE METABOLIC PANEL WITH GFR
ALT: 22 U/L (ref 0–53)
AST: 28 U/L (ref 0–37)
Albumin: 3.7 g/dL (ref 3.5–5.2)
Alkaline Phosphatase: 80 U/L (ref 39–117)
BUN: 14 mg/dL (ref 6–23)
CO2: 31 meq/L (ref 19–32)
Calcium: 9.1 mg/dL (ref 8.4–10.5)
Chloride: 101 meq/L (ref 96–112)
Creatinine, Ser: 1 mg/dL (ref 0.4–1.5)
GFR: 77.41 mL/min
Glucose, Bld: 96 mg/dL (ref 70–99)
Potassium: 4.2 meq/L (ref 3.5–5.1)
Sodium: 136 meq/L (ref 135–145)
Total Bilirubin: 0.6 mg/dL (ref 0.2–1.2)
Total Protein: 7.7 g/dL (ref 6.0–8.3)

## 2014-04-05 NOTE — Assessment & Plan Note (Signed)
LPR in past but doing better now without Rx

## 2014-04-05 NOTE — Assessment & Plan Note (Signed)
Will continue care with Dr Henderson's successor

## 2014-04-05 NOTE — Assessment & Plan Note (Signed)
Mild symptoms Rx not needed as yet

## 2014-04-05 NOTE — Assessment & Plan Note (Signed)
I have personally reviewed the Medicare Annual Wellness questionnaire and have noted 1. The patient's medical and social history 2. Their use of alcohol, tobacco or illicit drugs 3. Their current medications and supplements 4. The patient's functional ability including ADL's, fall risks, home safety risks and hearing or visual             impairment. 5. Diet and physical activities 6. Evidence for depression or mood disorders  The patients weight, height, BMI and visual acuity have been recorded in the chart I have made referrals, counseling and provided education to the patient based review of the above and I have provided the pt with a written personalized care plan for preventive services.  I have provided you with a copy of your personalized plan for preventive services. Please take the time to review along with your updated medication list.  Done with colonoscopies No PSA due to age Will need prevnar but recently got flu --so will have to wait Doesn't think he had chicken pox--so will hold off on zostavax

## 2014-04-05 NOTE — Assessment & Plan Note (Signed)
Stiff in am then eases up Discussed options for Rx He doesn't want anything now

## 2014-04-05 NOTE — Progress Notes (Signed)
Pre visit review using our clinic review tool, if applicable. No additional management support is needed unless otherwise documented below in the visit note. 

## 2014-04-05 NOTE — Assessment & Plan Note (Signed)
See social history 

## 2014-04-05 NOTE — Progress Notes (Signed)
Subjective:    Patient ID: Robert Vega, male    DOB: Aug 05, 1940, 73 y.o.   MRN: [id number]  HPI Here for Medicare wellness and follow up Reviewed form and advanced directives No alcohol. Still smokes pipe--has given up the cigars Still with lots of stress caring for elderly dad Reviewed other doctors Hearing loss persists and tinnitus. No sig change. 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 Walks a lot with going to and from [employer] Independent in instrumental ADLs No depression or anhedonia. Has "weight" with care of his dad No falls  Having problems with bad back pain Bilateral low back pain into backs of thighs and tailbone at times Mostly he feels it in AM---will ease up as he moves around Hasn't had to stop any tasks Doesn't usually take any meds Discussed heat balms, etc---he hasn't used this as yet No leg weakness  Had spoke on inside of jaw Biopsied by dentist--- turned out to be benign  Dermatologist retired Geophysicist/field seismologist) Will continue with ongoing care due to past melanoma  No reflux problems No heartburn No dysphagia  Still with some urinary urgency Will have increased frequency at times Usually no nocturia  Initiates sleep fine Gets awakening after 5-5.5 hours Mind is racing with things to do then and can't get back to sleep Does get tired during the day--but no naps  Current Outpatient Prescriptions on File Prior to Visit  Medication Sig Dispense Refill  . aspirin 81 MG tablet Take 81 mg by mouth daily.      . fish oil-omega-3 fatty acids 1000 MG capsule Take 1 g by mouth daily.    . Multiple Vitamins-Minerals (MULTIVITAMIN WITH MINERALS) tablet Take 1 tablet by mouth daily.     No current facility-administered medications on file prior to visit.    Allergies  Allergen Reactions  . Sulfonamide Derivatives     Past Medical History  Diagnosis Date  . Allergy   . Arthritis   . Colonic polyp     . GERD (gastroesophageal reflux disease)     Has LPR  . BPH (benign prostatic hypertrophy)     Past Surgical History  Procedure Laterality Date  . Cataract extraction      2011, other in 2013  . Colonoscopy  2011    Family History  Problem Relation Age of Onset  . Hypertension Mother   . Healthy Father   . Heart disease Neg Hx   . Diabetes Neg Hx   . Cancer Neg Hx     History   Social History  . Marital Status: Single    Spouse Name: N/A    Number of Children: 2  . Years of Education: N/A   Occupational History  . retired--Sales Teacher, early years/pre    Social History Main Topics  . Smoking status: Current Every Day Smoker -- 2.00 packs/day for 50 years    Types: Pipe  . Smokeless tobacco: Never Used     Comment: pipe and occasional cigar  . Alcohol Use: No  . Drug Use: No  . Sexual Activity: Not on file   Other Topics Concern  . Not on file   Social History Narrative   Cares for invalid father now   No living will   Has no health care POA--would favor both of his sons equally   Would accept resuscitation and tube feedings if appropriate   Review of Systems Appetite is good Weight up  3# Bowels are okay    Objective:   Physical Exam  Constitutional: He is oriented to person, place, and time. He appears well-developed and well-nourished. No distress.  HENT:  Mouth/Throat: Oropharynx is clear and moist. No oropharyngeal exudate.  Neck: Normal range of motion. Neck supple. No thyromegaly present.  Cardiovascular: Normal rate, regular rhythm, normal heart sounds and intact distal pulses.  Exam reveals no gallop.   No murmur heard. Pulmonary/Chest: Effort normal and breath sounds normal. No respiratory distress. He has no wheezes. He has no rales.  Abdominal: Soft. There is no tenderness.  Musculoskeletal: He exhibits no edema or tenderness.  Lymphadenopathy:    He has no cervical adenopathy.  Neurological: He is alert and oriented to person, place, and time.   President-- "[address] [place], [place]" [phone] D-l-r-o-w Recall 2/3  Skin: No rash noted.  Psychiatric: He has a normal mood and affect. His behavior is normal.          Assessment & Plan:

## 2014-04-06 ENCOUNTER — Telehealth: Payer: Self-pay | Admitting: Internal Medicine

## 2014-04-06 NOTE — Telephone Encounter (Signed)
emmi emailed °

## 2014-09-14 NOTE — Op Note (Signed)
PATIENT NAME:  Robert Vega, Robert Vega MR#:  [id number] DATE OF BIRTH:  [birth year]  DATE OF PROCEDURE:  04/15/2012  PREOPERATIVE DIAGNOSIS:  Senile cataract left eye.  POSTOPERATIVE DIAGNOSIS:  Senile cataract left eye.  PROCEDURE:  Phacoemulsification with posterior chamber intraocular lens implantation of the left eye.  LENS:  SN60WF 19.0 diopter posterior chamber intraocular lens.  ULTRASOUND TIME:  11% of 1 minute, 10 seconds for CDE 7.5.  SURGEON:  Mali Liya Strollo, MD  ANESTHESIA:  Topical with tetracaine drops and 2% Xylocaine jelly.  COMPLICATIONS:  None.  DESCRIPTION OF PROCEDURE:  The patient was identified in the holding room and transported to the operating room and placed in the supine position under the operating microscope.  The left eye was identified as the operative eye and it was prepped and draped in the usual sterile ophthalmic fashion.  A 1 millimeter clear-corneal paracentesis was made at the 1:30 position.  The anterior chamber was filled with Viscoat viscoelastic.  A 2.4 millimeter keratome was used to make a near-clear corneal incision at the 10:30 position.  A curvilinear capsulorrhexis was made with a cystotome and capsulorrhexis forceps.  Balanced salt solution was used to hydrodissect and hydrodelineate the nucleus.  Phacoemulsification was then used in horizontal chopping fashion to remove the lens nucleus and epinucleus.  The remaining cortex was then removed using the irrigation and aspiration handpiece. Provisc was then placed into the capsular bag to distend it for lens placement.  A SN60WF 19.0 diopter lens was then injected into the capsular bag.  The remaining viscoelastic was aspirated.  Wounds were hydrated with balanced salt solution.  The anterior chamber was inflated to a physiologic pressure with balanced salt solution.  Miostat was placed into the anterior chamber to constrict the pupil. No wound leaks were noted.  Topical Vigamox drops and Maxitrol  ointment were applied to the eye. The patient was taken to the recovery room in stable condition without complications of anesthesia or surgery. ____________________________ Wyonia Hough, MD crb:slb D: 04/15/2012 12:16:54 ET T: 04/15/2012 12:23:52 ET JOB#: [id number]  cc: Wyonia Hough, MD, <Dictator> Leandrew Koyanagi MD ELECTRONICALLY SIGNED 04/16/2012 11:59

## 2014-10-18 DIAGNOSIS — L570 Actinic keratosis: Secondary | ICD-10-CM | POA: Diagnosis not present

## 2014-10-18 DIAGNOSIS — Z87898 Personal history of other specified conditions: Secondary | ICD-10-CM | POA: Diagnosis not present

## 2014-10-18 DIAGNOSIS — Z86006 Personal history of melanoma in-situ: Secondary | ICD-10-CM | POA: Insufficient documentation

## 2014-11-22 ENCOUNTER — Other Ambulatory Visit: Payer: Self-pay

## 2015-02-24 DIAGNOSIS — Z23 Encounter for immunization: Secondary | ICD-10-CM | POA: Diagnosis not present

## 2015-03-14 DIAGNOSIS — D499 Neoplasm of unspecified behavior of unspecified site: Secondary | ICD-10-CM | POA: Diagnosis not present

## 2015-03-14 DIAGNOSIS — L570 Actinic keratosis: Secondary | ICD-10-CM | POA: Diagnosis not present

## 2015-03-14 DIAGNOSIS — L821 Other seborrheic keratosis: Secondary | ICD-10-CM | POA: Diagnosis not present

## 2015-03-14 DIAGNOSIS — Z87898 Personal history of other specified conditions: Secondary | ICD-10-CM | POA: Diagnosis not present

## 2015-03-15 DIAGNOSIS — H353131 Nonexudative age-related macular degeneration, bilateral, early dry stage: Secondary | ICD-10-CM | POA: Diagnosis not present

## 2015-04-08 ENCOUNTER — Encounter: Payer: Self-pay | Admitting: Internal Medicine

## 2015-04-08 ENCOUNTER — Ambulatory Visit (INDEPENDENT_AMBULATORY_CARE_PROVIDER_SITE_OTHER): Payer: Medicare Other | Admitting: Internal Medicine

## 2015-04-08 VITALS — BP 130/80 | HR 64 | Temp 97.8°F | Ht 70.0 in | Wt 221.0 lb

## 2015-04-08 DIAGNOSIS — M4806 Spinal stenosis, lumbar region: Secondary | ICD-10-CM | POA: Diagnosis not present

## 2015-04-08 DIAGNOSIS — Z7189 Other specified counseling: Secondary | ICD-10-CM

## 2015-04-08 DIAGNOSIS — Z23 Encounter for immunization: Secondary | ICD-10-CM | POA: Diagnosis not present

## 2015-04-08 DIAGNOSIS — Z0000 Encounter for general adult medical examination without abnormal findings: Secondary | ICD-10-CM | POA: Diagnosis not present

## 2015-04-08 DIAGNOSIS — M48061 Spinal stenosis, lumbar region without neurogenic claudication: Secondary | ICD-10-CM

## 2015-04-08 DIAGNOSIS — N400 Enlarged prostate without lower urinary tract symptoms: Secondary | ICD-10-CM

## 2015-04-08 NOTE — Assessment & Plan Note (Signed)
Not bad enough for meds

## 2015-04-08 NOTE — Patient Instructions (Addendum)
Consider trying lactaid pills before meals with cheese--this may reduce your gas.  DASH Eating Plan DASH stands for "Dietary Approaches to Stop Hypertension." The DASH eating plan is a healthy eating plan that has been shown to reduce high blood pressure (hypertension). Additional health benefits may include reducing the risk of type 2 diabetes mellitus, heart disease, and stroke. The DASH eating plan may also help with weight loss. WHAT DO I NEED TO KNOW ABOUT THE DASH EATING PLAN? For the DASH eating plan, you will follow these general guidelines:  Choose foods with a percent daily value for sodium of less than 5% (as listed on the food label).  Use salt-free seasonings or herbs instead of table salt or sea salt.  Check with your health care provider or pharmacist before using salt substitutes.  Eat lower-sodium products, often labeled as "lower sodium" or "no salt added."  Eat fresh foods.  Eat more vegetables, fruits, and low-fat dairy products.  Choose whole grains. Look for the word "whole" as the first word in the ingredient list.  Choose fish and skinless chicken or [place] more often than red meat. Limit fish, poultry, and meat to 6 oz (170 g) each day.  Limit sweets, desserts, sugars, and sugary drinks.  Choose heart-healthy fats.  Limit cheese to 1 oz (28 g) per day.  Eat more home-cooked food and less restaurant, buffet, and fast food.  Limit fried foods.  Cook foods using methods other than frying.  Limit canned vegetables. If you do use them, rinse them well to decrease the sodium.  When eating at a restaurant, ask that your food be prepared with less salt, or no salt if possible. WHAT FOODS CAN I EAT? Seek help from a dietitian for individual calorie needs. Grains Whole grain or whole wheat bread. Brown rice. Whole grain or whole wheat pasta. Quinoa, bulgur, and whole grain cereals. Low-sodium cereals. Corn or whole wheat flour tortillas. Whole grain cornbread.  Whole grain crackers. Low-sodium crackers. Vegetables Fresh or frozen vegetables (raw, steamed, roasted, or grilled). Low-sodium or reduced-sodium tomato and vegetable juices. Low-sodium or reduced-sodium tomato sauce and paste. Low-sodium or reduced-sodium canned vegetables.  Fruits All fresh, canned (in natural juice), or frozen fruits. Meat and Other Protein Products Ground beef (85% or leaner), grass-fed beef, or beef trimmed of fat. Skinless chicken or [place]. Ground chicken or [place]. Pork trimmed of fat. All fish and seafood. Eggs. Dried beans, peas, or lentils. Unsalted nuts and seeds. Unsalted canned beans. Dairy Low-fat dairy products, such as skim or 1% milk, 2% or reduced-fat cheeses, low-fat ricotta or cottage cheese, or plain low-fat yogurt. Low-sodium or reduced-sodium cheeses. Fats and Oils Tub margarines without trans fats. Light or reduced-fat mayonnaise and salad dressings (reduced sodium). Avocado. Safflower, olive, or canola oils. Natural peanut or almond butter. Other Unsalted popcorn and pretzels. The items listed above may not be a complete list of recommended foods or beverages. Contact your dietitian for more options. WHAT FOODS ARE NOT RECOMMENDED? Grains White bread. White pasta. White rice. Refined cornbread. Bagels and croissants. Crackers that contain trans fat. Vegetables Creamed or fried vegetables. Vegetables in a cheese sauce. Regular canned vegetables. Regular canned tomato sauce and paste. Regular tomato and vegetable juices. Fruits Dried fruits. Canned fruit in light or heavy syrup. Fruit juice. Meat and Other Protein Products Fatty cuts of meat. Ribs, chicken wings, bacon, sausage, bologna, salami, chitterlings, fatback, hot dogs, bratwurst, and packaged luncheon meats. Salted nuts and seeds. Canned beans with salt. Dairy Whole  or 2% milk, cream, half-and-half, and cream cheese. Whole-fat or sweetened yogurt. Full-fat cheeses or blue cheese. Nondairy  creamers and whipped toppings. Processed cheese, cheese spreads, or cheese curds. Condiments Onion and garlic salt, seasoned salt, table salt, and sea salt. Canned and packaged gravies. Worcestershire sauce. Tartar sauce. Barbecue sauce. Teriyaki sauce. Soy sauce, including reduced sodium. Steak sauce. Fish sauce. Oyster sauce. Cocktail sauce. Horseradish. Ketchup and mustard. Meat flavorings and tenderizers. Bouillon cubes. Hot sauce. Tabasco sauce. Marinades. Taco seasonings. Relishes. Fats and Oils Butter, stick margarine, lard, shortening, ghee, and bacon fat. Coconut, palm kernel, or palm oils. Regular salad dressings. Other Pickles and olives. Salted popcorn and pretzels. The items listed above may not be a complete list of foods and beverages to avoid. Contact your dietitian for more information. WHERE CAN I FIND MORE INFORMATION? National Heart, Lung, and Blood Institute: travelstabloid.com   This information is not intended to replace advice given to you by your health care provider. Make sure you discuss any questions you have with your health care provider.   Document Released: 05/03/2011 Document Revised: 06/04/2014 Document Reviewed: 03/18/2013 Elsevier Interactive Patient Education [employer].

## 2015-04-08 NOTE — Progress Notes (Signed)
Pre visit review using our clinic review tool, if applicable. No additional management support is needed unless otherwise documented below in the visit note. 

## 2015-04-08 NOTE — Assessment & Plan Note (Signed)
I have personally reviewed the Medicare Annual Wellness questionnaire and have noted 1. The patient's medical and social history 2. Their use of alcohol, tobacco or illicit drugs 3. Their current medications and supplements 4. The patient's functional ability including ADL's, fall risks, home safety risks and hearing or visual             impairment. 5. Diet and physical activities 6. Evidence for depression or mood disorders  The patients weight, height, BMI and visual acuity have been recorded in the chart I have made referrals, counseling and provided education to the patient based review of the above and I have provided the pt with a written personalized care plan for preventive services.  I have provided you with a copy of your personalized plan for preventive services. Please take the time to review along with your updated medication list.  prevnar today No zostavax--not sure he had chicken pox Discussed healthier eating No cancer screening due to age

## 2015-04-08 NOTE — Assessment & Plan Note (Signed)
Plans to do formal health care POA

## 2015-04-08 NOTE — Progress Notes (Signed)
Subjective:    Patient ID: Robert Vega, male    DOB: 1940/08/25, 74 y.o.   MRN: [id number]  HPI Here for Medicare wellness and follow up of chronic medical conditions Reviewed form and advanced directives Reviewed other doctors Had 1 fall a couple of weeks ago--while working on porch (through spot where plank missing) Mild injury Will have some depressed days---nothing persistent. Not anhedonic Vision is okay-- regular eye exams. Mild hearing loss-- has some buzzing Quit pipe--no longer smokes! No alcohol No significant cognitive problems---trouble trying to juggle 3-4 things at the same time though Independent with instrumental ADLs  Having ongoing problems with back Starts day very stiff--takes 15 or more minutes to loosen up Low back and down both legs (posteriorly) No limitations--- fully active in garden, etc Has tried tylenol occasionally at night---does help some  Also now with pain in left 4th MTP Popping and noticeable in AM  No heartburn problems--but gets postprandial gas Eats a lot of cheese--- discussed lactaid No swallowing problems  Current Outpatient Prescriptions on File Prior to Visit  Medication Sig Dispense Refill  . aspirin 81 MG tablet Take 81 mg by mouth daily.      . fish oil-omega-3 fatty acids 1000 MG capsule Take 1 g by mouth daily.    . Multiple Vitamins-Minerals (MULTIVITAMIN WITH MINERALS) tablet Take 1 tablet by mouth daily.     No current facility-administered medications on file prior to visit.    Allergies  Allergen Reactions  . Sulfonamide Derivatives     Past Medical History  Diagnosis Date  . Allergy   . Arthritis   . Colonic polyp   . GERD (gastroesophageal reflux disease)     Has LPR  . BPH (benign prostatic hypertrophy)     Past Surgical History  Procedure Laterality Date  . Cataract extraction      2011, other in 2013  . Colonoscopy  2011    Family History  Problem Relation Age of Onset  . Hypertension  Mother   . Heart disease Neg Hx   . Diabetes Neg Hx   . Cancer Neg Hx     Social History   Social History  . Marital Status: Divorced    Spouse Name: N/A  . Number of Children: 2  . Years of Education: N/A   Occupational History  . retired--car dealer/sales    Social History Main Topics  . Smoking status: Former Smoker -- 2.00 packs/day for 50 years    Types: Pipe    Quit date: 10/20/2014  . Smokeless tobacco: Never Used     Comment: pipe   . Alcohol Use: No  . Drug Use: No  . Sexual Activity: Not on file   Other Topics Concern  . Not on file   Social History Narrative   No living will   Has no health care POA--would favor both of his sons equally   Would accept resuscitation and tube feedings if appropriate   Review of Systems Lost dad in the past year---doing okay with this but adjusting to the lack of structure. Appetite is good Weight is up a few pounds---didn't have the summer loss as usual. Being more careful with eating and doing more walking Still only sleeps 5 hours a night---started with caring for dad. Can't improve even though he is gone Stays involved in his church Regular dermatology appts Teeth are not good--resisting a lot of work No chest pain Chronic dyspnea--relates to abnormal septum. Doing okay with exercise  regimen    Objective:   Physical Exam  Constitutional: He is oriented to person, place, and time. He appears well-developed and well-nourished. No distress.  HENT:  Mouth/Throat: Oropharynx is clear and moist. No oropharyngeal exudate.  Neck: Normal range of motion. Neck supple. No thyromegaly present.  Cardiovascular: Normal rate, regular rhythm, normal heart sounds and intact distal pulses.  Exam reveals no gallop.   No murmur heard. Pulmonary/Chest: Effort normal and breath sounds normal. No respiratory distress. He has no wheezes. He has no rales.  Abdominal: Soft. There is no tenderness.  Musculoskeletal: He exhibits no edema or  tenderness.  Lymphadenopathy:    He has no cervical adenopathy.  Neurological: He is alert and oriented to person, place, and time.  President-- "Elyn Peers, [place], [place]" [phone] D-l-r-o-w Recall 2/3  Skin: No rash noted. No erythema.  Psychiatric: He has a normal mood and affect. His behavior is normal.          Assessment & Plan:

## 2015-04-08 NOTE — Addendum Note (Signed)
Addended by: Despina Hidden on: 04/08/2015 10:49 AM   Modules accepted: Orders

## 2015-04-08 NOTE — Assessment & Plan Note (Signed)
Mild Discussed nightly acetaminophen

## 2015-04-12 ENCOUNTER — Encounter: Payer: Self-pay | Admitting: Internal Medicine

## 2015-09-05 DIAGNOSIS — C44612 Basal cell carcinoma of skin of right upper limb, including shoulder: Secondary | ICD-10-CM | POA: Diagnosis not present

## 2015-09-05 DIAGNOSIS — L570 Actinic keratosis: Secondary | ICD-10-CM | POA: Diagnosis not present

## 2015-09-05 DIAGNOSIS — Z86008 Personal history of in-situ neoplasm of other site: Secondary | ICD-10-CM | POA: Diagnosis not present

## 2015-09-05 DIAGNOSIS — D485 Neoplasm of uncertain behavior of skin: Secondary | ICD-10-CM | POA: Diagnosis not present

## 2015-09-07 DIAGNOSIS — C44612 Basal cell carcinoma of skin of right upper limb, including shoulder: Secondary | ICD-10-CM | POA: Diagnosis not present

## 2015-10-04 DIAGNOSIS — C4491 Basal cell carcinoma of skin, unspecified: Secondary | ICD-10-CM | POA: Diagnosis not present

## 2016-02-17 DIAGNOSIS — D2272 Melanocytic nevi of left lower limb, including hip: Secondary | ICD-10-CM | POA: Diagnosis not present

## 2016-02-17 DIAGNOSIS — D2261 Melanocytic nevi of right upper limb, including shoulder: Secondary | ICD-10-CM | POA: Diagnosis not present

## 2016-02-17 DIAGNOSIS — L570 Actinic keratosis: Secondary | ICD-10-CM | POA: Diagnosis not present

## 2016-02-17 DIAGNOSIS — X32XXXA Exposure to sunlight, initial encounter: Secondary | ICD-10-CM | POA: Diagnosis not present

## 2016-02-17 DIAGNOSIS — Z85828 Personal history of other malignant neoplasm of skin: Secondary | ICD-10-CM | POA: Diagnosis not present

## 2016-02-17 DIAGNOSIS — Z85820 Personal history of malignant melanoma of skin: Secondary | ICD-10-CM | POA: Diagnosis not present

## 2016-02-28 DIAGNOSIS — Z23 Encounter for immunization: Secondary | ICD-10-CM | POA: Diagnosis not present

## 2016-03-16 DIAGNOSIS — H353131 Nonexudative age-related macular degeneration, bilateral, early dry stage: Secondary | ICD-10-CM | POA: Diagnosis not present

## 2016-04-10 ENCOUNTER — Encounter: Payer: Self-pay | Admitting: Internal Medicine

## 2016-04-10 ENCOUNTER — Ambulatory Visit (INDEPENDENT_AMBULATORY_CARE_PROVIDER_SITE_OTHER): Payer: Medicare Other | Admitting: Internal Medicine

## 2016-04-10 VITALS — BP 132/88 | HR 68 | Temp 98.6°F | Ht 69.25 in | Wt 225.0 lb

## 2016-04-10 DIAGNOSIS — M48061 Spinal stenosis, lumbar region without neurogenic claudication: Secondary | ICD-10-CM | POA: Diagnosis not present

## 2016-04-10 DIAGNOSIS — K219 Gastro-esophageal reflux disease without esophagitis: Secondary | ICD-10-CM

## 2016-04-10 DIAGNOSIS — Z7189 Other specified counseling: Secondary | ICD-10-CM | POA: Diagnosis not present

## 2016-04-10 DIAGNOSIS — F39 Unspecified mood [affective] disorder: Secondary | ICD-10-CM | POA: Insufficient documentation

## 2016-04-10 DIAGNOSIS — Z0000 Encounter for general adult medical examination without abnormal findings: Secondary | ICD-10-CM

## 2016-04-10 LAB — CBC WITH DIFFERENTIAL/PLATELET
Basophils Absolute: 0 10*3/uL (ref 0.0–0.1)
Basophils Relative: 0.6 % (ref 0.0–3.0)
EOS ABS: 0 10*3/uL (ref 0.0–0.7)
EOS PCT: 0.7 % (ref 0.0–5.0)
HEMATOCRIT: 38.7 % — AB (ref 39.0–52.0)
Hemoglobin: 13.2 g/dL (ref 13.0–17.0)
LYMPHS PCT: 31.7 % (ref 12.0–46.0)
Lymphs Abs: 1.8 10*3/uL (ref 0.7–4.0)
MCHC: 34.2 g/dL (ref 30.0–36.0)
MCV: 95.4 fl (ref 78.0–100.0)
Monocytes Absolute: 0.8 10*3/uL (ref 0.1–1.0)
Monocytes Relative: 14 % — ABNORMAL HIGH (ref 3.0–12.0)
Neutro Abs: 3.1 10*3/uL (ref 1.4–7.7)
Neutrophils Relative %: 53 % (ref 43.0–77.0)
Platelets: 281 10*3/uL (ref 150.0–400.0)
RBC: 4.06 Mil/uL — ABNORMAL LOW (ref 4.22–5.81)
RDW: 13.6 % (ref 11.5–15.5)
WBC: 5.8 10*3/uL (ref 4.0–10.5)

## 2016-04-10 LAB — COMPREHENSIVE METABOLIC PANEL
ALBUMIN: 4.4 g/dL (ref 3.5–5.2)
ALT: 17 U/L (ref 0–53)
AST: 25 U/L (ref 0–37)
Alkaline Phosphatase: 73 U/L (ref 39–117)
BUN: 14 mg/dL (ref 6–23)
CALCIUM: 9.3 mg/dL (ref 8.4–10.5)
CO2: 31 mEq/L (ref 19–32)
Chloride: 100 mEq/L (ref 96–112)
Creatinine, Ser: 1.01 mg/dL (ref 0.40–1.50)
GFR: 76.11 mL/min (ref >60.00)
Glucose, Bld: 89 mg/dL (ref 70–99)
POTASSIUM: 4.5 meq/L (ref 3.5–5.1)
Sodium: 136 mEq/L (ref 135–145)
Total Bilirubin: 0.4 mg/dL (ref 0.2–1.2)
Total Protein: 7.9 g/dL (ref 6.0–8.3)

## 2016-04-10 NOTE — Assessment & Plan Note (Signed)
I have personally reviewed the Medicare Annual Wellness questionnaire and have noted 1. The patient's medical and social history 2. Their use of alcohol, tobacco or illicit drugs 3. Their current medications and supplements 4. The patient's functional ability including ADL's, fall risks, home safety risks and hearing or visual             impairment. 5. Diet and physical activities 6. Evidence for depression or mood disorders  The patients weight, height, BMI and visual acuity have been recorded in the chart I have made referrals, counseling and provided education to the patient based review of the above and I have provided the pt with a written personalized care plan for preventive services.  I have provided you with a copy of your personalized plan for preventive services. Please take the time to review along with your updated medication list.  No zostavax due to no varicella as child Has had flu vaccine No cancer screening due to age Discussed fitness and healthier eating (has big cookie and Klondike bar every day)

## 2016-04-10 NOTE — Progress Notes (Signed)
Pre visit review using our clinic review tool, if applicable. No additional management support is needed unless otherwise documented below in the visit note. 

## 2016-04-10 NOTE — Patient Instructions (Addendum)
You might want to consider trying gingko biloba to see if it helps your memory.  DASH Eating Plan DASH stands for "Dietary Approaches to Stop Hypertension." The DASH eating plan is a healthy eating plan that has been shown to reduce high blood pressure (hypertension). Additional health benefits may include reducing the risk of type 2 diabetes mellitus, heart disease, and stroke. The DASH eating plan may also help with weight loss. What do I need to know about the DASH eating plan? For the DASH eating plan, you will follow these general guidelines:  Choose foods with less than 150 milligrams of sodium per serving (as listed on the food label).  Use salt-free seasonings or herbs instead of table salt or sea salt.  Check with your health care provider or pharmacist before using salt substitutes.  Eat lower-sodium products. These are often labeled as "low-sodium" or "no salt added."  Eat fresh foods. Avoid eating a lot of canned foods.  Eat more vegetables, fruits, and low-fat dairy products.  Choose whole grains. Look for the word "whole" as the first word in the ingredient list.  Choose fish and skinless chicken or [place] more often than red meat. Limit fish, poultry, and meat to 6 oz (170 g) each day.  Limit sweets, desserts, sugars, and sugary drinks.  Choose heart-healthy fats.  Eat more home-cooked food and less restaurant, buffet, and fast food.  Limit fried foods.  Do not fry foods. Cook foods using methods such as baking, boiling, grilling, and broiling instead.  When eating at a restaurant, ask that your food be prepared with less salt, or no salt if possible. What foods can I eat? Seek help from a dietitian for individual calorie needs. Grains  Whole grain or whole wheat bread. Brown rice. Whole grain or whole wheat pasta. Quinoa, bulgur, and whole grain cereals. Low-sodium cereals. Corn or whole wheat flour tortillas. Whole grain cornbread. Whole grain crackers.  Low-sodium crackers. Vegetables  Fresh or frozen vegetables (raw, steamed, roasted, or grilled). Low-sodium or reduced-sodium tomato and vegetable juices. Low-sodium or reduced-sodium tomato sauce and paste. Low-sodium or reduced-sodium canned vegetables. Fruits  All fresh, canned (in natural juice), or frozen fruits. Meat and Other Protein Products  Ground beef (85% or leaner), grass-fed beef, or beef trimmed of fat. Skinless chicken or [place]. Ground chicken or [place]. Pork trimmed of fat. All fish and seafood. Eggs. Dried beans, peas, or lentils. Unsalted nuts and seeds. Unsalted canned beans. Dairy  Low-fat dairy products, such as skim or 1% milk, 2% or reduced-fat cheeses, low-fat ricotta or cottage cheese, or plain low-fat yogurt. Low-sodium or reduced-sodium cheeses. Fats and Oils  Tub margarines without trans fats. Light or reduced-fat mayonnaise and salad dressings (reduced sodium). Avocado. Safflower, olive, or canola oils. Natural peanut or almond butter. Other  Unsalted popcorn and pretzels. The items listed above may not be a complete list of recommended foods or beverages. Contact your dietitian for more options.  What foods are not recommended? Grains  White bread. White pasta. White rice. Refined cornbread. Bagels and croissants. Crackers that contain trans fat. Vegetables  Creamed or fried vegetables. Vegetables in a cheese sauce. Regular canned vegetables. Regular canned tomato sauce and paste. Regular tomato and vegetable juices. Fruits  Canned fruit in light or heavy syrup. Fruit juice. Meat and Other Protein Products  Fatty cuts of meat. Ribs, chicken wings, bacon, sausage, bologna, salami, chitterlings, fatback, hot dogs, bratwurst, and packaged luncheon meats. Salted nuts and seeds. Canned beans with salt. Dairy  Whole or 2% milk, cream, half-and-half, and cream cheese. Whole-fat or sweetened yogurt. Full-fat cheeses or blue cheese. Nondairy creamers and whipped  toppings. Processed cheese, cheese spreads, or cheese curds. Condiments  Onion and garlic salt, seasoned salt, table salt, and sea salt. Canned and packaged gravies. Worcestershire sauce. Tartar sauce. Barbecue sauce. Teriyaki sauce. Soy sauce, including reduced sodium. Steak sauce. Fish sauce. Oyster sauce. Cocktail sauce. Horseradish. Ketchup and mustard. Meat flavorings and tenderizers. Bouillon cubes. Hot sauce. Tabasco sauce. Marinades. Taco seasonings. Relishes. Fats and Oils  Butter, stick margarine, lard, shortening, ghee, and bacon fat. Coconut, palm kernel, or palm oils. Regular salad dressings. Other  Pickles and olives. Salted popcorn and pretzels. The items listed above may not be a complete list of foods and beverages to avoid. Contact your dietitian for more information.  Where can I find more information? National Heart, Lung, and Blood Institute: travelstabloid.com This information is not intended to replace advice given to you by your health care provider. Make sure you discuss any questions you have with your health care provider. Document Released: 05/03/2011 Document Revised: 10/20/2015 Document Reviewed: 03/18/2013 Elsevier Interactive Patient Education  2017 [employer].

## 2016-04-10 NOTE — Progress Notes (Signed)
Subjective:    Patient ID: Robert Vega, male    DOB: 10/09/1940, 75 y.o.   MRN: [id number]  HPI Here for Medicare wellness and follow up on chronic health conditions Reviewed form and advanced directives Reviewed other doctors No tobacco now--quit. Rare alcohol 1 fall this year---tripped on porch steps. 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) Still uses tractor--bought a new one that is safer  Still concerned about his memory -- "constantly concerned" Lots of dementia in his family Can't "multitask" like he could in the past  Has some mood problems Was considering leaving the country due to political issues Gets regular stress and depressed mood at times Not anhedonic--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 acetaminophen (TYLENOL) 650 MG CR tablet Take 650 mg by mouth at bedtime.    Marland Kitchen aspirin 81 MG tablet Take 81 mg by mouth daily.      . fish oil-omega-3 fatty acids 1000 MG capsule Take 1 g by mouth daily.    . Multiple Vitamins-Minerals (MULTIVITAMIN WITH MINERALS) tablet Take 1 tablet by mouth daily.     No current facility-administered medications on file  prior to visit.     Allergies  Allergen Reactions  . Sulfonamide Derivatives     Past Medical History:  Diagnosis Date  . Allergy   . Arthritis   . BPH (benign prostatic hypertrophy)   . Colonic polyp   . GERD (gastroesophageal reflux disease)    Has LPR    Past Surgical History:  Procedure Laterality Date  . CATARACT EXTRACTION     2011, other in 2013  . COLONOSCOPY  2011    Family History  Problem Relation Age of Onset  . Hypertension Mother   . Heart disease Neg Hx   . Diabetes Neg Hx   . Cancer Neg Hx     Social History   Social History  . Marital status: Divorced    Spouse name: N/A  . Number of children: 2  . Years of education: N/A   Occupational History  . retired--car dealer/sales    Social History Main Topics  . Smoking status: Former Smoker    Packs/day: 2.00    Years: 50.00    Types: Pipe    Quit date: 10/20/2014  . Smokeless tobacco: Never Used     Comment: pipe   . Alcohol use No  . Drug use: No  . Sexual activity: Not on file   Other Topics Concern  . Not on file   Social History Narrative   No living will   Has no health care POA--would favor both of his sons equally   Would accept resuscitation and tube feedings if appropriate  Review of Systems Appetite is okay Weight is up slightly Sleeps okay--but not long. Some daytime tiredness. Rests after lunch but no napping No dysphagia-- does have rare acid reflux (caused chest pain) Does get a lot of gas--bought gas-x but didn't try it Chronic SOB--relates to his nose Occasional dizziness--no syncope No edema    Objective:   Physical Exam  Constitutional: He is oriented to person, place, and time. He appears well-developed and well-nourished. No distress.  HENT:  Mouth/Throat: Oropharynx is clear and moist. No oropharyngeal exudate.  Neck: Normal range of motion. Neck supple. No thyromegaly present.  Cardiovascular: Normal rate, regular rhythm, normal heart sounds and intact  distal pulses.  Exam reveals no gallop.   No murmur heard. Pulmonary/Chest: Effort normal and breath sounds normal. No respiratory distress. He has no wheezes. He has no rales.  Abdominal: Soft. There is no tenderness.  Musculoskeletal: He exhibits no edema or tenderness.  Lymphadenopathy:    He has no cervical adenopathy.  Neurological: He is alert and oriented to person, place, and time.  President-- "Dwaine Deter, Republican from [place] whose dad was also---George Remer Macho, Bush" [id number] D-l-r-o-w Recall 2/3          Assessment & Plan:

## 2016-04-10 NOTE — Assessment & Plan Note (Signed)
Mild dysthymia without anhedonia No meds needed Worries about his memory but no functional changes

## 2016-04-10 NOTE — Assessment & Plan Note (Signed)
Forms given Urged him to do formal paperwork

## 2016-04-10 NOTE — Assessment & Plan Note (Signed)
Rare symptoms Urged him to try antacid if any chest symptoms

## 2016-04-10 NOTE — Assessment & Plan Note (Signed)
Ongoing mild back pain Not really restricted

## 2016-05-04 ENCOUNTER — Other Ambulatory Visit: Payer: Self-pay

## 2016-07-12 ENCOUNTER — Encounter: Payer: Self-pay | Admitting: Internal Medicine

## 2016-08-16 DIAGNOSIS — Z85820 Personal history of malignant melanoma of skin: Secondary | ICD-10-CM | POA: Diagnosis not present

## 2016-08-16 DIAGNOSIS — D225 Melanocytic nevi of trunk: Secondary | ICD-10-CM | POA: Diagnosis not present

## 2016-08-16 DIAGNOSIS — Z85828 Personal history of other malignant neoplasm of skin: Secondary | ICD-10-CM | POA: Diagnosis not present

## 2016-08-16 DIAGNOSIS — L570 Actinic keratosis: Secondary | ICD-10-CM | POA: Diagnosis not present

## 2016-08-16 DIAGNOSIS — D2272 Melanocytic nevi of left lower limb, including hip: Secondary | ICD-10-CM | POA: Diagnosis not present

## 2016-08-16 DIAGNOSIS — X32XXXA Exposure to sunlight, initial encounter: Secondary | ICD-10-CM | POA: Diagnosis not present

## 2016-10-13 DIAGNOSIS — Z85820 Personal history of malignant melanoma of skin: Secondary | ICD-10-CM | POA: Diagnosis not present

## 2016-10-13 DIAGNOSIS — Z87891 Personal history of nicotine dependence: Secondary | ICD-10-CM | POA: Diagnosis not present

## 2016-10-13 DIAGNOSIS — E669 Obesity, unspecified: Secondary | ICD-10-CM | POA: Diagnosis not present

## 2016-10-13 DIAGNOSIS — M479 Spondylosis, unspecified: Secondary | ICD-10-CM | POA: Diagnosis not present

## 2016-10-13 DIAGNOSIS — Z6833 Body mass index (BMI) 33.0-33.9, adult: Secondary | ICD-10-CM | POA: Diagnosis not present

## 2016-10-15 ENCOUNTER — Telehealth: Payer: Self-pay | Admitting: Internal Medicine

## 2016-10-15 NOTE — Telephone Encounter (Signed)
Ensure no pain/constriction of penis. Could try OTC lotrimin antifungal for possible fungal infection.  If no better, needs OV.

## 2016-10-15 NOTE — Telephone Encounter (Signed)
Spoke to pt. Advised him to let us know by Thursday if it was not any better so he could make an OV before the holiday weekend.

## 2016-10-15 NOTE — Telephone Encounter (Signed)
Patient Name: Robert Vega  DOB: 05/09/1941    Initial Comment Please let the phone ring several times -- Caller says, has problem with penis foreskin shrinking. He wants to know if there is anything topical he can use to help with this ?    Nurse Assessment  Nurse: Raphael Gibney, RN, Vanita Ingles Date/Time (Eastern Time): 10/15/2016 8:30:37 AM  Confirm and document reason for call. If symptomatic, describe symptoms. ---Caller states the foreskin on his penis is shrinking. Wants to know if there is anything topical he can use. Foreskin is tight on his penis. Urine goes everywhere when he urinates.  Does the patient have any new or worsening symptoms? ---Yes  Will a triage be completed? ---Yes  Related visit to physician within the last 2 weeks? ---No  Does the PT have any chronic conditions? (i.e. diabetes, asthma, etc.) ---No  Is this a behavioral health or substance abuse call? ---No     Guidelines    Guideline Title Affirmed Question Affirmed Notes  Penis and Scrotum Symptoms All other penis - scrotum symptoms (Exception: painless rash < 24 hours duration)    Final Disposition User   See PCP When Office is Open (within 3 days) Raphael Gibney, RN, Vanita Ingles    Comments  pt only wants to see Dr. Silvio Pate. Does not want to go to urgent care or see another doctor. He would like recommendation of something topical he can use until he can see Dr. Silvio Pate. Please let phone ring several times.   Disagree/Comply: Disagree  Disagree/Comply Reason: Disagree with instructions

## 2016-10-23 ENCOUNTER — Ambulatory Visit (INDEPENDENT_AMBULATORY_CARE_PROVIDER_SITE_OTHER): Payer: Medicare PPO | Admitting: Family Medicine

## 2016-10-23 ENCOUNTER — Encounter: Payer: Self-pay | Admitting: Family Medicine

## 2016-10-23 VITALS — BP 150/100 | HR 62 | Temp 98.2°F | Wt 229.5 lb

## 2016-10-23 DIAGNOSIS — M25511 Pain in right shoulder: Secondary | ICD-10-CM | POA: Diagnosis not present

## 2016-10-23 DIAGNOSIS — N471 Phimosis: Secondary | ICD-10-CM | POA: Insufficient documentation

## 2016-10-23 DIAGNOSIS — R030 Elevated blood-pressure reading, without diagnosis of hypertension: Secondary | ICD-10-CM

## 2016-10-23 DIAGNOSIS — N476 Balanoposthitis: Secondary | ICD-10-CM

## 2016-10-23 MED ORDER — FLUCONAZOLE 150 MG PO TABS: 150.0000 mg | ORAL_TABLET | Freq: Once | ORAL | 0 refills | Status: AC

## 2016-10-23 MED ORDER — MUPIROCIN 2 % EX OINT: 1.0000 "application " | TOPICAL_OINTMENT | Freq: Two times a day (BID) | CUTANEOUS | 0 refills | Status: DC

## 2016-10-23 NOTE — Assessment & Plan Note (Signed)
Infectious - anticipate candidal. Will treat with diflucan 150mg  x1, then continue lotrimin cream BID. Add mupirocin to cover possible bacterial component.  Discussed sitz bath and other supportive care.  RTC 2 wk f/u visit.

## 2016-10-23 NOTE — Patient Instructions (Addendum)
You do have foreskin infection - treat with diflucan oral pill x1, continue clotrimazole and add mupirocin, both twice daily. Use warm sitz baths once daily as well. Follow up in 2 weeks.  For shoulder - possible rotator cuff, possible shoulder arthritis. Ok to continue glucosamine for joint health.  Start checking blood pressures at home and let us know if consistently >150/90.

## 2016-10-23 NOTE — Assessment & Plan Note (Signed)
Not consistent with RTC. ?shoulder arthritis vs labral irritation. Declines exercises today. Will try glucosamine and update if not improved.

## 2016-10-23 NOTE — Progress Notes (Signed)
BP (!) 150/100 (BP Location: Right Arm, Cuff Size: Large)   Pulse 62   Temp 98.2 F (36.8 C) (Oral)   Wt 229 lb 8 oz (104.1 kg)   SpO2 97%   BMI 33.65 kg/m    CC: penile issue Subjective:    Patient ID: Robert Vega, male    DOB: [birth year], 76 y.o.   MRN: [id number]  HPI: Robert Vega is a 76 y.o. male presenting on 10/23/2016 for Follow-up and See phone note   Intermittent issue with foreskin tightening over the last few years, acutely worse over the last 1.5 wks. More noticeable when voiding - trouble controlling urination.  Denies rash, drainage, fevers. Some burning when urine contacts skin.  Treated empirically with OTC lotrimin antifungal cream without improvement. Has also been using OTC aquaphor lotion.  Doesn't want to be circumcised.   Denies new sexual contacts - divorced x2, lives alone.   BP elevated today - no h/o HTN.  BP Readings from Last 3 Encounters:  10/23/16 (!) 150/100  04/10/16 132/88  04/08/15 130/80     Also notes R shoulder ache/popping over last few months. Saw [facility] nurse x2, told f/u with ortho, but pt not interested in surgery "not painful enough". Started using OTC glucosamine. Denies inciting trauma/injury to shoulder.   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   Relevant past medical, surgical, family and social history reviewed and updated as indicated. Interim medical history since our last visit reviewed. Allergies and medications reviewed and updated. Outpatient Medications Prior to Visit  Medication Sig Dispense Refill  . acetaminophen (TYLENOL) 650 MG CR tablet Take 650 mg by mouth at bedtime.    Marland Kitchen aspirin 81 MG tablet Take 81 mg by mouth daily.      . fish oil-omega-3 fatty acids 1000 MG capsule Take 1 g by mouth daily.    . Multiple Vitamins-Minerals (MULTIVITAMIN WITH MINERALS) tablet Take 1 tablet by mouth daily.     No facility-administered medications prior to visit.      Per HPI unless specifically  indicated in ROS section below Review of Systems     Objective:    BP (!) 150/100 (BP Location: Right Arm, Cuff Size: Large)   Pulse 62   Temp 98.2 F (36.8 C) (Oral)   Wt 229 lb 8 oz (104.1 kg)   SpO2 97%   BMI 33.65 kg/m   Wt Readings from Last 3 Encounters:  10/23/16 229 lb 8 oz (104.1 kg)  04/10/16 225 lb (102.1 kg)  04/08/15 221 lb (100.2 kg)    Physical Exam  Constitutional: He appears well-developed and well-nourished. No distress.  Abdominal: Hernia confirmed negative in the right inguinal area and confirmed negative in the left inguinal area.  Genitourinary: Testes normal. Uncircumcised. Phimosis and penile tenderness present.  Genitourinary Comments: Swelling of foreskin with fissures and ulceration of skin around foreskin. Unable to evaluate glans.   Musculoskeletal: He exhibits no edema.  L shoulder WNL R shoulder exam: No deformity of shoulders on inspection. No pain with palpation of shoulder landmarks. FROM in abduction and forward flexion. No pain or weakness with testing SITS in ext/int rotation. No pain with empty can sign. Neg Speed test. No impingement. No pain with crossover test. Mild discomfort with rotation of humeral head in [place] joint.   Lymphadenopathy:       Right: No inguinal adenopathy present.       Left: No inguinal adenopathy present.  Nursing note and vitals  reviewed.     Assessment & Plan:   Problem List Items Addressed This Visit    Balanoposthitis - Primary    Infectious - anticipate candidal. Will treat with diflucan 150mg  x1, then continue lotrimin cream BID. Add mupirocin to cover possible bacterial component.  Discussed sitz bath and other supportive care.  RTC 2 wk f/u visit.       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 He will start monitoring blood pressures at home and notify us if persistently elevated.       Right shoulder pain    Not  consistent with RTC. ?shoulder arthritis vs labral irritation. Declines exercises today. Will try glucosamine and update if not improved.           Follow up plan: Return in about 2 weeks (around 11/06/2016) for follow up visit.  Ria Bush, MD

## 2016-10-23 NOTE — Assessment & Plan Note (Signed)
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 He will start monitoring blood pressures at home and notify us if persistently elevated.

## 2016-11-08 ENCOUNTER — Telehealth: Payer: Self-pay

## 2016-11-08 NOTE — Telephone Encounter (Signed)
Pt left v/m; pt was seen 10/23/16; pt has followed Dr Synthia Innocent instructions and he feels he has improved tremendously; BP over last 10 days are considerably lower than when seen 10/23/16. The highest BP since seen was 143/67  on 10/29/16 and the lowest reading since seen was 115/59 on 11/02/16. Pt has not take BP in last few days but said systolic average has been 132.

## 2016-11-08 NOTE — Telephone Encounter (Signed)
Pt notified of Dr. Synthia Innocent comments/ instructions and verbalized understanding

## 2016-11-08 NOTE — Telephone Encounter (Signed)
Wonderful to hear. Thanks for the update.  Would just rec f/u if bp trending up or recurrent symptoms.

## 2017-02-21 DIAGNOSIS — Z23 Encounter for immunization: Secondary | ICD-10-CM | POA: Diagnosis not present

## 2017-03-22 DIAGNOSIS — H353131 Nonexudative age-related macular degeneration, bilateral, early dry stage: Secondary | ICD-10-CM | POA: Diagnosis not present

## 2017-04-11 ENCOUNTER — Ambulatory Visit (INDEPENDENT_AMBULATORY_CARE_PROVIDER_SITE_OTHER): Payer: Medicare PPO | Admitting: Internal Medicine

## 2017-04-11 ENCOUNTER — Encounter: Payer: Self-pay | Admitting: Internal Medicine

## 2017-04-11 VITALS — BP 138/80 | HR 68 | Temp 98.2°F | Ht 69.0 in | Wt 226.0 lb

## 2017-04-11 DIAGNOSIS — J301 Allergic rhinitis due to pollen: Secondary | ICD-10-CM

## 2017-04-11 DIAGNOSIS — N400 Enlarged prostate without lower urinary tract symptoms: Secondary | ICD-10-CM

## 2017-04-11 DIAGNOSIS — M48061 Spinal stenosis, lumbar region without neurogenic claudication: Secondary | ICD-10-CM | POA: Diagnosis not present

## 2017-04-11 DIAGNOSIS — Z0000 Encounter for general adult medical examination without abnormal findings: Secondary | ICD-10-CM | POA: Diagnosis not present

## 2017-04-11 DIAGNOSIS — Z7189 Other specified counseling: Secondary | ICD-10-CM

## 2017-04-11 DIAGNOSIS — F39 Unspecified mood [affective] disorder: Secondary | ICD-10-CM

## 2017-04-11 DIAGNOSIS — Z23 Encounter for immunization: Secondary | ICD-10-CM

## 2017-04-11 NOTE — Assessment & Plan Note (Signed)
See social history 

## 2017-04-11 NOTE — Assessment & Plan Note (Signed)
Chronic low level anxiety and dysthymia meds not indicated

## 2017-04-11 NOTE — Progress Notes (Signed)
Subjective:    Patient ID: Robert Vega, male    DOB: 1940/10/11, 76 y.o.   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 No injury Independent with instrumental ADLs  Some ongoing problems with his foreskin Gets especially bad in winter--all his skin dries out Unable to retract at times and irregular stream Doesn't want circumcision Does get more pliable with soaking in tub--but he hates that   Having right shoulder pain Some popping ---has tried glucosamine/chondroitin at low dose May be helping some Some back pain but not bad  Ongoing mild mood issues Trouble dealing with aging and the ageism in people Cons[place] in [place]--but didn't Not anhedonic Highly anxious  Current Outpatient Medications on File Prior to Visit  Medication Sig Dispense Refill  . acetaminophen (TYLENOL) 650 MG CR tablet Take 650 mg by mouth at bedtime.    Marland Kitchen aspirin 81 MG tablet Take 81 mg by mouth daily.      . fish oil-omega-3 fatty acids 1000 MG capsule Take 1 g by mouth daily.    Marland Kitchen GLUCOSAMINE-CHONDROITIN-MSM PO Take by mouth.    . Multiple Vitamins-Minerals (MULTIVITAMIN WITH MINERALS) tablet Take 1 tablet by mouth daily.    . mupirocin ointment (BACTROBAN) 2 % Apply 1 application topically 2 (two) times daily. 30 g 0   No current facility-administered medications on file prior to visit.     Allergies  Allergen Reactions  . Sulfonamide Derivatives     Past Medical History:  Diagnosis Date  . Allergy   . Arthritis   . BPH (benign prostatic hypertrophy)   . Colonic polyp   . GERD (gastroesophageal reflux disease)    Has LPR     Past Surgical History:  Procedure Laterality Date  . CATARACT EXTRACTION     2011, other in 2013  . COLONOSCOPY  2011    Family History  Problem Relation Age of Onset  . Hypertension Mother   . Heart disease Neg Hx   . Diabetes Neg Hx   . Cancer Neg Hx     Social History   Socioeconomic History  . Marital status: Divorced    Spouse name: Not on file  . Number of children: 2  . Years of education: Not on file  . Highest education level: Not on file  Social Needs  . Financial resource strain: Not on file  . Food insecurity - worry: Not on file  . Food insecurity - inability: Not on file  . Transportation needs - medical: Not on file  . Transportation needs - non-medical: Not on file  Occupational History  . Occupation: retired--car dealer/sales  Tobacco Use  . Smoking status: Former Smoker    Packs/day: 2.00    Years: 50.00    Pack years: 100.00    Types: Pipe    Last attempt to quit: 10/20/2014    Years since quitting: 2.4  . Smokeless tobacco: Never Used  . Tobacco comment: pipe   Substance and Sexual Activity  . Alcohol use: No    Alcohol/week: 0.0 oz  . Drug use: No  . Sexual activity: Not on file  Other Topics Concern  . Not  on file  Social History Narrative   Has living will   Has no health care POA--would favor both of his sons equally (Primary is Nicole Kindred)   Would accept resuscitation and tube feedings if appropriate   Review of Systems Did have some dizziness--checked BP and it was fine Some chest pain-- slight substernal awareness. Rare and not exertional Chronic breathing issues from nose--but no SOB with activity Sleeps okay but not long. Some daytime tiredness but doesn't nap Appetite is good---did cut out the ice cream but not the cookie Weight is stable Some trouble with teeth--goes every 4 months to dentist Wears seat belt Keeps up with dermatologist Some trouble with bowels-- "I don't get a complete push with my daily movement". No  blood Urine flow okay Some ongoing allergy symptoms--runny nose especially in the m[place]ing    Objective:   Physical Exam  Constitutional: He is oriented to person, place, and time. No distress.  HENT:  Mouth/Throat: Oropharynx is clear and moist. No oropharyngeal exudate.  Neck: No thyromegaly present.  Cardiovascular: Normal rate, regular rhythm, normal heart sounds and intact distal pulses. Exam reveals no gallop.  No murmur heard. Pulmonary/Chest: Effort normal and breath sounds normal. No respiratory distress. He has no wheezes. He has no rales.  Abdominal: Soft. There is no tenderness.  Genitourinary:  Genitourinary Comments: Still with fissuring of foreskin--not inflamed  Musculoskeletal: He exhibits no edema or tenderness.  Fair ROM in right shoulder. No crepitus  Lymphadenopathy:    He has no cervical adenopathy.  Neurological: He is alert and oriented to person, place, and time.  President--- "Daisy Floro, Obama, Bush" [phone] D-l-r-o-w Recall 2/3  Skin: No rash noted. No erythema.  Psychiatric: He has a normal mood and affect. His behavior is normal.          Assessment & Plan:

## 2017-04-11 NOTE — Progress Notes (Signed)
Hearing Screening   Method: Audiometry   125Hz  250Hz  500Hz  1000Hz  2000Hz  3000Hz  4000Hz  6000Hz  8000Hz   Right ear:   20 20 20  25     Left ear:   20 20 20  20     Vision Screening Comments: October 2018

## 2017-04-11 NOTE — Assessment & Plan Note (Signed)
Discussed using OTC meds as needed

## 2017-04-11 NOTE — Assessment & Plan Note (Signed)
Not having bad problems now Not restricted

## 2017-04-11 NOTE — Assessment & Plan Note (Signed)
Voids okay but some split stream due to foreskin

## 2017-04-11 NOTE — Assessment & Plan Note (Signed)
I have personally reviewed the Medicare Annual Wellness questionnaire and have noted 1. The patient's medical and social history 2. Their use of alcohol, tobacco or illicit drugs 3. Their current medications and supplements 4. The patient's functional ability including ADL's, fall risks, home safety risks and hearing or visual             impairment. 5. Diet and physical activities 6. Evidence for depression or mood disorders  The patients weight, height, BMI and visual acuity have been recorded in the chart I have made referrals, counseling and provided education to the patient based review of the above and I have provided the pt with a written personalized care plan for preventive services.  I have provided you with a copy of your personalized plan for preventive services. Please take the time to review along with your updated medication list.  Fairly healthy Recommended shingrix--he isn't excited Will need pneumovax booster Had flu vaccine Benign colon in 2011 No PSA due to age

## 2017-04-11 NOTE — Addendum Note (Signed)
Addended by: Pilar Grammes on: 04/11/2017 04:04 PM   Modules accepted: Orders

## 2017-05-09 DIAGNOSIS — L570 Actinic keratosis: Secondary | ICD-10-CM | POA: Diagnosis not present

## 2017-05-09 DIAGNOSIS — Z85828 Personal history of other malignant neoplasm of skin: Secondary | ICD-10-CM | POA: Diagnosis not present

## 2017-05-09 DIAGNOSIS — D2261 Melanocytic nevi of right upper limb, including shoulder: Secondary | ICD-10-CM | POA: Diagnosis not present

## 2017-05-09 DIAGNOSIS — D2272 Melanocytic nevi of left lower limb, including hip: Secondary | ICD-10-CM | POA: Diagnosis not present

## 2017-05-09 DIAGNOSIS — X32XXXA Exposure to sunlight, initial encounter: Secondary | ICD-10-CM | POA: Diagnosis not present

## 2017-05-09 DIAGNOSIS — Z85820 Personal history of malignant melanoma of skin: Secondary | ICD-10-CM | POA: Diagnosis not present

## 2017-05-09 DIAGNOSIS — D485 Neoplasm of uncertain behavior of skin: Secondary | ICD-10-CM | POA: Diagnosis not present

## 2017-07-01 DIAGNOSIS — L570 Actinic keratosis: Secondary | ICD-10-CM | POA: Diagnosis not present

## 2017-07-01 DIAGNOSIS — X32XXXA Exposure to sunlight, initial encounter: Secondary | ICD-10-CM | POA: Diagnosis not present

## 2017-11-29 DIAGNOSIS — Z6834 Body mass index (BMI) 34.0-34.9, adult: Secondary | ICD-10-CM | POA: Diagnosis not present

## 2017-11-29 DIAGNOSIS — H547 Unspecified visual loss: Secondary | ICD-10-CM | POA: Diagnosis not present

## 2017-11-29 DIAGNOSIS — M545 Low back pain: Secondary | ICD-10-CM | POA: Diagnosis not present

## 2017-11-29 DIAGNOSIS — Z87891 Personal history of nicotine dependence: Secondary | ICD-10-CM | POA: Diagnosis not present

## 2017-11-29 DIAGNOSIS — M1710 Unilateral primary osteoarthritis, unspecified knee: Secondary | ICD-10-CM | POA: Diagnosis not present

## 2017-11-29 DIAGNOSIS — E669 Obesity, unspecified: Secondary | ICD-10-CM | POA: Diagnosis not present

## 2018-02-27 DIAGNOSIS — Z23 Encounter for immunization: Secondary | ICD-10-CM | POA: Diagnosis not present

## 2018-04-14 ENCOUNTER — Encounter: Payer: Self-pay | Admitting: Internal Medicine

## 2018-04-14 ENCOUNTER — Ambulatory Visit (INDEPENDENT_AMBULATORY_CARE_PROVIDER_SITE_OTHER): Payer: Medicare PPO | Admitting: Internal Medicine

## 2018-04-14 VITALS — BP 138/90 | HR 59 | Temp 98.3°F | Ht 69.5 in | Wt 228.0 lb

## 2018-04-14 DIAGNOSIS — Z0000 Encounter for general adult medical examination without abnormal findings: Secondary | ICD-10-CM | POA: Diagnosis not present

## 2018-04-14 DIAGNOSIS — F39 Unspecified mood [affective] disorder: Secondary | ICD-10-CM

## 2018-04-14 DIAGNOSIS — N400 Enlarged prostate without lower urinary tract symptoms: Secondary | ICD-10-CM

## 2018-04-14 DIAGNOSIS — J301 Allergic rhinitis due to pollen: Secondary | ICD-10-CM | POA: Diagnosis not present

## 2018-04-14 DIAGNOSIS — Z7189 Other specified counseling: Secondary | ICD-10-CM | POA: Diagnosis not present

## 2018-04-14 DIAGNOSIS — E669 Obesity, unspecified: Secondary | ICD-10-CM | POA: Diagnosis not present

## 2018-04-14 DIAGNOSIS — K219 Gastro-esophageal reflux disease without esophagitis: Secondary | ICD-10-CM | POA: Diagnosis not present

## 2018-04-14 DIAGNOSIS — E66811 Obesity, class 1: Secondary | ICD-10-CM

## 2018-04-14 LAB — CBC
HEMATOCRIT: 40 % (ref 39.0–52.0)
HEMOGLOBIN: 13.7 g/dL (ref 13.0–17.0)
MCHC: 34.3 g/dL (ref 30.0–36.0)
MCV: 97.6 fl (ref 78.0–100.0)
PLATELETS: 272 10*3/uL (ref 150.0–400.0)
RBC: 4.1 Mil/uL — ABNORMAL LOW (ref 4.22–5.81)
RDW: 12.9 % (ref 11.5–15.5)
WBC: 7.5 10*3/uL (ref 4.0–10.5)

## 2018-04-14 LAB — COMPREHENSIVE METABOLIC PANEL
ALBUMIN: 4.6 g/dL (ref 3.5–5.2)
ALK PHOS: 71 U/L (ref 39–117)
ALT: 17 U/L (ref 0–53)
AST: 24 U/L (ref 0–37)
BUN: 15 mg/dL (ref 6–23)
CALCIUM: 9.3 mg/dL (ref 8.4–10.5)
CO2: 30 mEq/L (ref 19–32)
Chloride: 99 mEq/L (ref 96–112)
Creatinine, Ser: 0.99 mg/dL (ref 0.40–1.50)
GFR: 77.48 mL/min (ref >60.00)
GLUCOSE: 96 mg/dL (ref 70–99)
Potassium: 4.3 mEq/L (ref 3.5–5.1)
Sodium: 136 mEq/L (ref 135–145)
TOTAL PROTEIN: 7.9 g/dL (ref 6.0–8.3)
Total Bilirubin: 0.5 mg/dL (ref 0.2–1.2)

## 2018-04-14 LAB — LIPID PANEL
CHOL/HDL RATIO: 4
Cholesterol: 192 mg/dL (ref 0–200)
HDL: 46.3 mg/dL (ref >39.00)
LDL Cholesterol: 117 mg/dL — ABNORMAL HIGH (ref 0–99)
NONHDL: 146.14
Triglycerides: 147 mg/dL (ref 0.0–149.0)
VLDL: 29.4 mg/dL (ref 0.0–40.0)

## 2018-04-14 NOTE — Assessment & Plan Note (Signed)
Discussed OTC antihistamines

## 2018-04-14 NOTE — Progress Notes (Signed)
Subjective:    Patient ID: Robert Vega, male    DOB: 1941-05-25, 77 y.o.   MRN: [id number]  HPI Here for Medicare wellness visit and follow up of chronic health conditions Reviewed form and advanced directives Reviewed other doctors No alcohol Plants 1.5 acres garden---this keeps him active. Has iWatch----keeping him busier by reminders No longer smoking cigar,etc---- quit about 5 years ago Vision and hearing are okay No falls Independent with instrumental ADLs  Ongoing problems with his foreskin Worried about cancer there---since his ex--BIL did get cancer Foreskin "will shrink"---then cracks when retracting Does try lotion and soaks  Is worried about his memory--but no major problems that he has noticed Can't multitask anymore  Has "self made" depression  Ongoing anxiety Still does okay without medications Stress with 79 year old church---threatening to shut down (this causes him stress)  Continually blows his nose Has tried benedryl with decongestant ----didn't take due to warnings Discussed options  Wonders about his cholesterol levels and blood work  Some heartburn Gives him some gas and pain into his chest--better after he burps No meds--but has tums to use No dysphagia  Current Outpatient Medications on File Prior to Visit  Medication Sig Dispense Refill  . acetaminophen (TYLENOL) 650 MG CR tablet Take 650 mg by mouth at bedtime.    Marland Kitchen aspirin 81 MG tablet Take 81 mg by mouth daily.      . fish oil-omega-3 fatty acids 1000 MG capsule Take 1 g by mouth daily.    Marland Kitchen GLUCOSAMINE-CHONDROITIN-MSM PO Take by mouth.    . Multiple Vitamins-Minerals (MULTIVITAMIN WITH MINERALS) tablet Take 1 tablet by mouth daily.     No current facility-administered medications on file prior to visit.     Allergies  Allergen Reactions  . Sulfonamide Derivatives     Past Medical History:  Diagnosis Date  . Allergy   . Arthritis   . BPH (benign prostatic hypertrophy)   .  Colonic polyp   . GERD (gastroesophageal reflux disease)    Has LPR    Past Surgical History:  Procedure Laterality Date  . CATARACT EXTRACTION     2011, other in 2013  . COLONOSCOPY  2011    Family History  Problem Relation Age of Onset  . Hypertension Mother   . Heart disease Neg Hx   . Diabetes Neg Hx   . Cancer Neg Hx     Social History   Socioeconomic History  . Marital status: Divorced    Spouse name: Not on file  . Number of children: 2  . Years of education: Not on file  . Highest education level: Not on file  Occupational History  . Occupation: retired--car dealer/sales  Social Needs  . Financial resource strain: Not on file  . Food insecurity:    Worry: Not on file    Inability: Not on file  . Transportation needs:    Medical: Not on file    Non-medical: Not on file  Tobacco Use  . Smoking status: Former Smoker    Packs/day: 2.00    Years: 50.00    Pack years: 100.00    Types: Pipe    Last attempt to quit: 10/20/2014    Years since quitting: 3.4  . Smokeless tobacco: Never Used  . Tobacco comment: pipe   Substance and Sexual Activity  . Alcohol use: No    Alcohol/week: 0.0 standard drinks  . Drug use: No  . Sexual activity: Not on file  Lifestyle  .  Physical activity:    Days per week: Not on file    Minutes per session: Not on file  . Stress: Not on file  Relationships  . Social connections:    Talks on phone: Not on file    Gets together: Not on file    Attends religious service: Not on file    Active member of club or organization: Not on file    Attends meetings of clubs or organizations: Not on file    Relationship status: Not on file  . Intimate partner violence:    Fear of current or ex partner: Not on file    Emotionally abused: Not on file    Physically abused: Not on file    Forced sexual activity: Not on file  Other Topics Concern  . Not on file  Social History Narrative   Has living will   Has no health care POA--would  favor both of his sons equally (Primary is Nicole Kindred)   Would accept resuscitation and tube feedings if appropriate   Review of Systems Keeps up with dermatologist--no recurrence of melanoma No breathing problems----chronic DOE without change in years (known deviated septum) Appetite is good Weight stable Teeth okay ---keeps up with dentist Sleeps okay but "not long". Awakens before his alarm. Will take occasional nap Wears seat belt Bowels are fine--no blood Voids okay. Flow is fine---but feels like it doesn't empty well    Objective:   Physical Exam  Constitutional: He is oriented to person, place, and time. He appears well-developed. No distress.  HENT:  Mouth/Throat: Oropharynx is clear and moist. No oropharyngeal exudate.  Neck: No thyromegaly present.  Cardiovascular: Normal rate, regular rhythm, normal heart sounds and intact distal pulses. Exam reveals no gallop.  No murmur heard. Respiratory: Effort normal and breath sounds normal. No respiratory distress. He has no wheezes. He has no rales.  GI: Soft. There is no tenderness.  Genitourinary:  Genitourinary Comments: Phimosis No foreskin or penile lesions noted  Musculoskeletal: He exhibits no edema or tenderness.  Lymphadenopathy:    He has no cervical adenopathy.  Neurological: He is alert and oriented to person, place, and time.  President--- "Dwaine Deter, guy from [place]--dad also president" [id number] D-l-r-o-w Recall 3/3  Skin: No rash noted. No erythema.  Psychiatric: He has a normal mood and affect. His behavior is normal.           Assessment & Plan:

## 2018-04-14 NOTE — Progress Notes (Signed)
Hearing Screening   Method: Audiometry   125Hz  250Hz  500Hz  1000Hz  2000Hz  3000Hz  4000Hz  6000Hz  8000Hz   Right ear:   20 40 20  40    Left ear:   20 25 20  20     Vision Screening Comments: Appt 04/29/18

## 2018-04-14 NOTE — Assessment & Plan Note (Signed)
Mild symptoms Not ready for Rx 

## 2018-04-14 NOTE — Assessment & Plan Note (Signed)
I have personally reviewed the Medicare Annual Wellness questionnaire and have noted 1. The patient's medical and social history 2. Their use of alcohol, tobacco or illicit drugs 3. Their current medications and supplements 4. The patient's functional ability including ADL's, fall risks, home safety risks and hearing or visual             impairment. 5. Diet and physical activities 6. Evidence for depression or mood disorders  The patients weight, height, BMI and visual acuity have been recorded in the chart I have made referrals, counseling and provided education to the patient based review of the above and I have provided the pt with a written personalized care plan for preventive services.  I have provided you with a copy of your personalized plan for preventive services. Please take the time to review along with your updated medication list.  Yearly flu vaccine Not excited about shingrix Discussed exercise---he is resistant to doing anything other than his large garden No cancer screening now--- colon fine in 2011

## 2018-04-14 NOTE — Assessment & Plan Note (Signed)
Chronic mild depression (but not MDD) and anxiety  Has been okay without medication

## 2018-04-14 NOTE — Assessment & Plan Note (Signed)
Discussed healthy eating

## 2018-04-14 NOTE — Patient Instructions (Addendum)
I would recommend loratadine 10mg  1-2 daily or cetirizine 10mg  daily for the runny nose (both over the counter)   DASH Eating Plan DASH stands for "Dietary Approaches to Stop Hypertension." The DASH eating plan is a healthy eating plan that has been shown to reduce high blood pressure (hypertension). It may also reduce your risk for type 2 diabetes, heart disease, and stroke. The DASH eating plan may also help with weight loss. What are tips for following this plan? General guidelines  Avoid eating more than 2,300 mg (milligrams) of salt (sodium) a day. If you have hypertension, you may need to reduce your sodium intake to 1,500 mg a day.  Limit alcohol intake to no more than 1 drink a day for nonpregnant women and 2 drinks a day for men. One drink equals 12 oz of beer, 5 oz of wine, or 1 oz of hard liquor.  Work with your health care provider to maintain a healthy body weight or to lose weight. Ask what an ideal weight is for you.  Get at least 30 minutes of exercise that causes your heart to beat faster (aerobic exercise) most days of the week. Activities may include walking, swimming, or biking.  Work with your health care provider or diet and nutrition specialist (dietitian) to adjust your eating plan to your individual calorie needs. Reading food labels  Check food labels for the amount of sodium per serving. Choose foods with less than 5 percent of the Daily Value of sodium. Generally, foods with less than 300 mg of sodium per serving fit into this eating plan.  To find whole grains, look for the word "whole" as the first word in the ingredient list. Shopping  Buy products labeled as "low-sodium" or "no salt added."  Buy fresh foods. Avoid canned foods and premade or frozen meals. Cooking  Avoid adding salt when cooking. Use salt-free seasonings or herbs instead of table salt or sea salt. Check with your health care provider or pharmacist before using salt substitutes.  Do not  fry foods. Cook foods using healthy methods such as baking, boiling, grilling, and broiling instead.  Cook with heart-healthy oils, such as olive, canola, soybean, or sunflower oil. Meal planning   Eat a balanced diet that includes: ? 5 or more servings of fruits and vegetables each day. At each meal, try to fill half of your plate with fruits and vegetables. ? Up to 6-8 servings of whole grains each day. ? Less than 6 oz of lean meat, poultry, or fish each day. A 3-oz serving of meat is about the same size as a deck of cards. One egg equals 1 oz. ? 2 servings of low-fat dairy each day. ? A serving of nuts, seeds, or beans 5 times each week. ? Heart-healthy fats. Healthy fats called Omega-3 fatty acids are found in foods such as flaxseeds and coldwater fish, like sardines, salmon, and mackerel.  Limit how much you eat of the following: ? Canned or prepackaged foods. ? Food that is high in trans fat, such as fried foods. ? Food that is high in saturated fat, such as fatty meat. ? Sweets, desserts, sugary drinks, and other foods with added sugar. ? Full-fat dairy products.  Do not salt foods before eating.  Try to eat at least 2 vegetarian meals each week.  Eat more home-cooked food and less restaurant, buffet, and fast food.  When eating at a restaurant, ask that your food be prepared with less salt or no salt,  if possible. What foods are recommended? The items listed may not be a complete list. Talk with your dietitian about what dietary choices are best for you. Grains Whole-grain or whole-wheat bread. Whole-grain or whole-wheat pasta. Brown rice. [place]row. Bulgur. Whole-grain and low-sodium cereals. Pita bread. Low-fat, low-sodium crackers. Whole-wheat flour tortillas. Vegetables Fresh or frozen vegetables (raw, steamed, roasted, or grilled). Low-sodium or reduced-sodium tomato and vegetable juice. Low-sodium or reduced-sodium tomato sauce and tomato paste. Low-sodium or  reduced-sodium canned vegetables. Fruits All fresh, dried, or frozen fruit. Canned fruit in natural juice (without added sugar). Meat and other protein foods Skinless chicken or [place]. Ground chicken or [place]. Pork with fat trimmed off. Fish and seafood. Egg whites. Dried beans, peas, or lentils. Unsalted nuts, nut butters, and seeds. Unsalted canned beans. Lean cuts of beef with fat trimmed off. Low-sodium, lean deli meat. Dairy Low-fat (1%) or fat-free (skim) milk. Fat-free, low-fat, or reduced-fat cheeses. Nonfat, low-sodium ricotta or cottage cheese. Low-fat or nonfat yogurt. Low-fat, low-sodium cheese. Fats and oils Soft margarine without trans fats. Vegetable oil. Low-fat, reduced-fat, or light mayonnaise and salad dressings (reduced-sodium). Canola, safflower, olive, soybean, and sunflower oils. Avocado. Seasoning and other foods Herbs. Spices. Seasoning mixes without salt. Unsalted popcorn and pretzels. Fat-free sweets. What foods are not recommended? The items listed may not be a complete list. Talk with your dietitian about what dietary choices are best for you. Grains Baked goods made with fat, such as croissants, muffins, or some breads. Dry pasta or rice meal packs. Vegetables Creamed or fried vegetables. Vegetables in a cheese sauce. Regular canned vegetables (not low-sodium or reduced-sodium). Regular canned tomato sauce and paste (not low-sodium or reduced-sodium). Regular tomato and vegetable juice (not low-sodium or reduced-sodium). Angie Fava. Olives. Fruits Canned fruit in a light or heavy syrup. Fried fruit. Fruit in cream or butter sauce. Meat and other protein foods Fatty cuts of meat. Ribs. Fried meat. Berniece Salines. Sausage. Bologna and other processed lunch meats. Salami. Fatback. Hotdogs. Bratwurst. Salted nuts and seeds. Canned beans with added salt. Canned or smoked fish. Whole eggs or egg yolks. Chicken or [place] with skin. Dairy Whole or 2% milk, cream, and half-and-half.  Whole or full-fat cream cheese. Whole-fat or sweetened yogurt. Full-fat cheese. Nondairy creamers. Whipped toppings. Processed cheese and cheese spreads. Fats and oils Butter. Stick margarine. Lard. Shortening. Ghee. Bacon fat. Tropical oils, such as coconut, palm kernel, or palm oil. Seasoning and other foods Salted popcorn and pretzels. Onion salt, garlic salt, seasoned salt, table salt, and sea salt. Worcestershire sauce. Tartar sauce. Barbecue sauce. Teriyaki sauce. Soy sauce, including reduced-sodium. Steak sauce. Canned and packaged gravies. Fish sauce. Oyster sauce. Cocktail sauce. Horseradish that you find on the shelf. Ketchup. Mustard. Meat flavorings and tenderizers. Bouillon cubes. Hot sauce and Tabasco sauce. Premade or packaged marinades. Premade or packaged taco seasonings. Relishes. Regular salad dressings. Where to find more information:  National Heart, Lung, and [place]: https://wilson-eaton.com/  American Heart Association: www.heart.org Summary  The DASH eating plan is a healthy eating plan that has been shown to reduce high blood pressure (hypertension). It may also reduce your risk for type 2 diabetes, heart disease, and stroke.  With the DASH eating plan, you should limit salt (sodium) intake to 2,300 mg a day. If you have hypertension, you may need to reduce your sodium intake to 1,500 mg a day.  When on the DASH eating plan, aim to eat more fresh fruits and vegetables, whole grains, lean proteins, low-fat dairy, and heart-healthy fats.  Work with your health care provider or diet and nutrition specialist (dietitian) to adjust your eating plan to your individual calorie needs. This information is not intended to replace advice given to you by your health care provider. Make sure you discuss any questions you have with your health care provider. Document Released: 05/03/2011 Document Revised: 05/07/2016 Document Reviewed: 05/07/2016 Elsevier Interactive Patient Education   [employer].

## 2018-04-14 NOTE — Assessment & Plan Note (Signed)
Does okay with prn med Will check labs

## 2018-04-14 NOTE — Assessment & Plan Note (Signed)
See social history 

## 2018-04-29 DIAGNOSIS — H353131 Nonexudative age-related macular degeneration, bilateral, early dry stage: Secondary | ICD-10-CM | POA: Diagnosis not present

## 2018-05-01 DIAGNOSIS — L821 Other seborrheic keratosis: Secondary | ICD-10-CM | POA: Diagnosis not present

## 2018-05-01 DIAGNOSIS — Z85820 Personal history of malignant melanoma of skin: Secondary | ICD-10-CM | POA: Diagnosis not present

## 2018-05-01 DIAGNOSIS — L853 Xerosis cutis: Secondary | ICD-10-CM | POA: Diagnosis not present

## 2018-05-01 DIAGNOSIS — D2271 Melanocytic nevi of right lower limb, including hip: Secondary | ICD-10-CM | POA: Diagnosis not present

## 2018-05-01 DIAGNOSIS — Z85828 Personal history of other malignant neoplasm of skin: Secondary | ICD-10-CM | POA: Diagnosis not present

## 2018-05-01 DIAGNOSIS — Z08 Encounter for follow-up examination after completed treatment for malignant neoplasm: Secondary | ICD-10-CM | POA: Diagnosis not present

## 2018-05-01 DIAGNOSIS — D2272 Melanocytic nevi of left lower limb, including hip: Secondary | ICD-10-CM | POA: Diagnosis not present

## 2018-05-01 DIAGNOSIS — D2261 Melanocytic nevi of right upper limb, including shoulder: Secondary | ICD-10-CM | POA: Diagnosis not present

## 2018-05-01 DIAGNOSIS — D2262 Melanocytic nevi of left upper limb, including shoulder: Secondary | ICD-10-CM | POA: Diagnosis not present

## 2019-03-23 DIAGNOSIS — H353131 Nonexudative age-related macular degeneration, bilateral, early dry stage: Secondary | ICD-10-CM | POA: Diagnosis not present

## 2019-04-17 ENCOUNTER — Encounter: Payer: Self-pay | Admitting: Internal Medicine

## 2019-04-17 ENCOUNTER — Other Ambulatory Visit: Payer: Self-pay

## 2019-04-17 ENCOUNTER — Ambulatory Visit (INDEPENDENT_AMBULATORY_CARE_PROVIDER_SITE_OTHER): Payer: Medicare Other | Admitting: Internal Medicine

## 2019-04-17 VITALS — BP 122/88 | HR 66 | Temp 98.6°F | Ht 69.0 in | Wt 237.0 lb

## 2019-04-17 DIAGNOSIS — F39 Unspecified mood [affective] disorder: Secondary | ICD-10-CM | POA: Diagnosis not present

## 2019-04-17 DIAGNOSIS — N471 Phimosis: Secondary | ICD-10-CM | POA: Diagnosis not present

## 2019-04-17 DIAGNOSIS — M48061 Spinal stenosis, lumbar region without neurogenic claudication: Secondary | ICD-10-CM

## 2019-04-17 DIAGNOSIS — K219 Gastro-esophageal reflux disease without esophagitis: Secondary | ICD-10-CM

## 2019-04-17 DIAGNOSIS — E669 Obesity, unspecified: Secondary | ICD-10-CM | POA: Diagnosis not present

## 2019-04-17 DIAGNOSIS — Z0000 Encounter for general adult medical examination without abnormal findings: Secondary | ICD-10-CM | POA: Diagnosis not present

## 2019-04-17 DIAGNOSIS — Z7189 Other specified counseling: Secondary | ICD-10-CM

## 2019-04-17 LAB — CBC
HCT: 38.9 % — ABNORMAL LOW (ref 39.0–52.0)
Hemoglobin: 13.2 g/dL (ref 13.0–17.0)
MCHC: 34 g/dL (ref 30.0–36.0)
MCV: 97.5 fl (ref 78.0–100.0)
Platelets: 246 10*3/uL (ref 150.0–400.0)
RBC: 3.99 Mil/uL — ABNORMAL LOW (ref 4.22–5.81)
RDW: 12.7 % (ref 11.5–15.5)
WBC: 6.3 10*3/uL (ref 4.0–10.5)

## 2019-04-17 LAB — COMPREHENSIVE METABOLIC PANEL
ALT: 15 U/L (ref 0–53)
AST: 24 U/L (ref 0–37)
Albumin: 4.4 g/dL (ref 3.5–5.2)
Alkaline Phosphatase: 68 U/L (ref 39–117)
BUN: 20 mg/dL (ref 6–23)
CO2: 30 mEq/L (ref 19–32)
Calcium: 9.3 mg/dL (ref 8.4–10.5)
Chloride: 100 mEq/L (ref 96–112)
Creatinine, Ser: 1 mg/dL (ref 0.40–1.50)
GFR: 71.87 mL/min (ref >60.00)
Glucose, Bld: 93 mg/dL (ref 70–99)
Potassium: 4.1 mEq/L (ref 3.5–5.1)
Sodium: 136 mEq/L (ref 135–145)
Total Bilirubin: 0.4 mg/dL (ref 0.2–1.2)
Total Protein: 7.7 g/dL (ref 6.0–8.3)

## 2019-04-17 NOTE — Assessment & Plan Note (Signed)
See social history 

## 2019-04-17 NOTE — Assessment & Plan Note (Signed)
No irritation No action needed

## 2019-04-17 NOTE — Progress Notes (Signed)
Hearing Screening   Method: Audiometry   125Hz 250Hz 500Hz 1000Hz 2000Hz 3000Hz 4000Hz 6000Hz 8000Hz  Right ear:   20 20 20  20    Left ear:   20 20 20  20    Vision Screening Comments: October 2020   

## 2019-04-17 NOTE — Assessment & Plan Note (Signed)
Occasional symptoms No dysphagia though

## 2019-04-17 NOTE — Progress Notes (Signed)
Subjective:    Patient ID: Robert Vega, male    DOB: 27-Sep-1940, 78 y.o.   MRN: [id number]  HPI Here for Medicare wellness visit and follow up of chronic health conditions This visit occurred during the SARS-CoV-2 public health emergency.  Safety protocols were in place, including screening questions prior to the visit, additional usage of staff PPE, and extensive cleaning of exam room while observing appropriate contact time as indicated for disinfecting solutions.  Reviewed form and advanced directives Reviewed other doctors No alcohol or tobacco Still stays active with his large garden--no other set exercise Vision and hearing are okay No sig falls--now has watch with emergency call feature (iWatch) Mild mood issues Independent with instrumental ADLs No memory problems that are concerning  Not really having a tough time with COVID Basically stays to himself anyway He does shopping once a week----early in day and careful. Out once a week to eat (also early)  His church was shut down and bought These are "happy people" Finally got the cemetary incorporated (his responsibility) Still with stress ---needs to make improvements Trying to sell his own property also--delays with surveys, etc No major depressive symptoms--just frustrating Sleeps well--but not that long  Nocturia x 1 mostly--but occasionally more Flow is reasonable Is concerned about emptying---will get some post void urgency  Some heartburn--some substernal discomfort Will belch and it gets better Occasionally will take gas pills No dysphagia  Back seems better with tylenol Takes MSM for shoulder--really helps this  Still has restriction in foreskin retraction No irritation now   Current Outpatient Medications on File Prior to Visit  Medication Sig Dispense Refill  . acetaminophen (TYLENOL) 650 MG CR tablet Take 650 mg by mouth at bedtime.    Marland Kitchen aspirin 81 MG tablet Take 81 mg by mouth daily.      .  fish oil-omega-3 fatty acids 1000 MG capsule Take 1 g by mouth daily.    Marland Kitchen GLUCOSAMINE-CHONDROITIN-MSM PO Take by mouth.    . Multiple Vitamins-Minerals (MULTIVITAMIN WITH MINERALS) tablet Take 1 tablet by mouth daily.     No current facility-administered medications on file prior to visit.     Allergies  Allergen Reactions  . Sulfonamide Derivatives     Past Medical History:  Diagnosis Date  . Allergy   . Arthritis   . BPH (benign prostatic hypertrophy)   . Colonic polyp   . GERD (gastroesophageal reflux disease)    Has LPR    Past Surgical History:  Procedure Laterality Date  . CATARACT EXTRACTION     2011, other in 2013  . COLONOSCOPY  2011    Family History  Problem Relation Age of Onset  . Hypertension Mother   . Heart disease Neg Hx   . Diabetes Neg Hx   . Cancer Neg Hx     Social History   Socioeconomic History  . Marital status: Divorced    Spouse name: Not on file  . Number of children: 2  . Years of education: Not on file  . Highest education level: Not on file  Occupational History  . Occupation: retired--car dealer/sales  Social Needs  . Financial resource strain: Not on file  . Food insecurity    Worry: Not on file    Inability: Not on file  . Transportation needs    Medical: Not on file    Non-medical: Not on file  Tobacco Use  . Smoking status: Former Smoker    Packs/day: 2.00  Years: 50.00    Pack years: 100.00    Types: Pipe    Quit date: 10/20/2014    Years since quitting: 4.4  . Smokeless tobacco: Never Used  . Tobacco comment: pipe   Substance and Sexual Activity  . Alcohol use: No    Alcohol/week: 0.0 standard drinks  . Drug use: No  . Sexual activity: Not on file  Lifestyle  . Physical activity    Days per week: Not on file    Minutes per session: Not on file  . Stress: Not on file  Relationships  . Social Herbalist on phone: Not on file    Gets together: Not on file    Attends religious service: Not on  file    Active member of club or organization: Not on file    Attends meetings of clubs or organizations: Not on file    Relationship status: Not on file  . Intimate partner violence    Fear of current or ex partner: Not on file    Emotionally abused: Not on file    Physically abused: Not on file    Forced sexual activity: Not on file  Other Topics Concern  . Not on file  Social History Narrative   Has living will   Has no health care POA--would favor both of his sons equally (Primary is Nicole Kindred)   Would accept resuscitation and tube feedings if appropriate   Review of Systems Appetite is okay Has gained weight---discussed this Wears seat belt Some issues with teeth--does see dentist (but wants new one due to insurance) Working with dermatologist---mostly dry No chest pain No SOB--other than due to "the messed up bone in my nose" No dizziness or syncope No edema     Objective:   Physical Exam  Constitutional: He is oriented to person, place, and time. He appears well-developed. No distress.  HENT:  Mouth/Throat: Oropharynx is clear and moist. No oropharyngeal exudate.  Neck: No thyromegaly present.  Cardiovascular: Normal rate, regular rhythm, normal heart sounds and intact distal pulses. Exam reveals no gallop.  No murmur heard. Respiratory: Effort normal and breath sounds normal. No respiratory distress. He has no wheezes. He has no rales.  GI: Soft. There is no abdominal tenderness.  Musculoskeletal:        General: No tenderness or edema.  Lymphadenopathy:    He has no cervical adenopathy.  Neurological: He is alert and oriented to person, place, and time.  President--- "Daisy Floro, Obama, guy from [place]" [phone] D-l-r-o-w Recall 3/3  Skin: No rash noted. No erythema.  Psychiatric: He has a normal mood and affect. His behavior is normal.           Assessment & Plan:

## 2019-04-17 NOTE — Assessment & Plan Note (Signed)
And shoulder, etc Tylenol does help

## 2019-04-17 NOTE — Assessment & Plan Note (Signed)
Has let himself go some Discussed reining it back in

## 2019-04-17 NOTE — Assessment & Plan Note (Signed)
Mild dysthymia Mostly situational No meds indicated

## 2019-04-17 NOTE — Assessment & Plan Note (Signed)
I have personally reviewed the Medicare Annual Wellness questionnaire and have noted 1. The patient's medical and social history 2. Their use of alcohol, tobacco or illicit drugs 3. Their current medications and supplements 4. The patient's functional ability including ADL's, fall risks, home safety risks and hearing or visual             impairment. 5. Diet and physical activities 6. Evidence for depression or mood disorders  The patients weight, height, BMI and visual acuity have been recorded in the chart I have made referrals, counseling and provided education to the patient based review of the above and I have provided the pt with a written personalized care plan for preventive services.  I have provided you with a copy of your personalized plan for preventive services. Please take the time to review along with your updated medication list.  Discussed fitness Had flu vaccine No cancer screening Not interested in shingrix

## 2019-12-16 ENCOUNTER — Telehealth: Payer: Self-pay | Admitting: *Deleted

## 2019-12-16 NOTE — Telephone Encounter (Signed)
Noted. PCP will discuss at upcoming appt.

## 2019-12-16 NOTE — Telephone Encounter (Signed)
Patient left a voicemail requesting that Dr. Silvio Pate call him back. Called patient back and advised him that Dr. Silvio Pate is out of the office this week. Patient stated that he has several concerns that he would like to talk with Dr. Silvio Pate about. Patient stated that he has felt for about a month that when he eats his food is not going down like it should. Patient stated that when he drinks something that seems to clear the problem up. Patient stated that he does eat food late at night before going to bed. Patient stated that he wakes up gasping for air and that has happened twice in the past month.  Patient stated after coughing a few times he is better. Patient stated that he has problems sleeping and wants to know if there is a pill to help him sleep. Patient stated that now he is sexually active and feels that he may have ED and wants to know if there is a pill for that. Advised patient with all of his concerns he should schedule an appointment to come in to discuss his concerns with Dr. Silvio Pate. Patient scheduled for an appointment with Dr. Silvio Pate 12/23/19 at 10:45 am. Patient was advised if he feels that he needs to see someone else sooner to call the office back and let us know. Patient stated that none of this is an emergency and feels that he is okay to wait to see his doctor. Patient was advised not to eat anything several hours before going to bed and he verbalized understanding.

## 2019-12-19 NOTE — Telephone Encounter (Signed)
Please ask him to start omeprazole 20mg  daily on an empty stomach (he could even try it twice a day at first)----that is likely to help the swallowing issues within a couple of weeks. We can review this and all the other concerns at his visit

## 2019-12-21 NOTE — Telephone Encounter (Signed)
Spoke to pt. He will be here on Wednesday for his appt. Will not be near a store until then to start the omeprazole.

## 2019-12-23 ENCOUNTER — Ambulatory Visit (INDEPENDENT_AMBULATORY_CARE_PROVIDER_SITE_OTHER): Payer: Medicare Other | Admitting: Internal Medicine

## 2019-12-23 ENCOUNTER — Other Ambulatory Visit: Payer: Self-pay

## 2019-12-23 ENCOUNTER — Encounter: Payer: Self-pay | Admitting: Internal Medicine

## 2019-12-23 DIAGNOSIS — F39 Unspecified mood [affective] disorder: Secondary | ICD-10-CM

## 2019-12-23 DIAGNOSIS — G479 Sleep disorder, unspecified: Secondary | ICD-10-CM

## 2019-12-23 DIAGNOSIS — K2100 Gastro-esophageal reflux disease with esophagitis, without bleeding: Secondary | ICD-10-CM

## 2019-12-23 MED ORDER — OMEPRAZOLE 20 MG PO CPDR: 20.0000 mg | DELAYED_RELEASE_CAPSULE | Freq: Every day | ORAL | 3 refills | Status: DC

## 2019-12-23 MED ORDER — TADALAFIL 20 MG PO TABS: 10.0000 mg | ORAL_TABLET | ORAL | 11 refills | Status: DC | PRN

## 2019-12-23 NOTE — Assessment & Plan Note (Signed)
Ongoing issues with this Some of this may be worsening with nocturnal reflux Will treat this Consider trying melatonin

## 2019-12-23 NOTE — Progress Notes (Signed)
Subjective:    Patient ID: Robert Vega, male    DOB: 04-17-1941, 79 y.o.   MRN: [id number]  HPI Here due to swallowing problems and issues with sleep This visit occurred during the SARS-CoV-2 public health emergency.  Safety protocols were in place, including screening questions prior to the visit, additional usage of staff PPE, and extensive cleaning of exam room while observing appropriate contact time as indicated for disinfecting solutions.   Not feeling right for a bit Wonders if he is outside in the heat too much Still dealing with the sale of the church--and also sold part of his property Dealt with incorporating cemetary, etc Keeping up his large garden "Not sure if it all came together and blew me away"  Initiates sleep well--but then trouble getting back to sleep after up to voi Throat problems--will feel heartburn up high and has to make him burp Has dysphagia--food seems to get stuck and some regurgitation  2 days ago--got "too hot in the field" Hard to even get up out the chair after that  Current Outpatient Medications on File Prior to Visit  Medication Sig Dispense Refill  . acetaminophen (TYLENOL) 650 MG CR tablet Take 650 mg by mouth at bedtime.    Marland Kitchen aspirin 81 MG tablet Take 81 mg by mouth daily.      . fish oil-omega-3 fatty acids 1000 MG capsule Take 1 g by mouth daily.    Marland Kitchen GLUCOSAMINE-CHONDROITIN-MSM PO Take by mouth.    . Multiple Vitamins-Minerals (MULTIVITAMIN WITH MINERALS) tablet Take 1 tablet by mouth daily.     No current facility-administered medications on file prior to visit.    Allergies  Allergen Reactions  . Sulfonamide Derivatives     Past Medical History:  Diagnosis Date  . Allergy   . Arthritis   . BPH (benign prostatic hypertrophy)   . Colonic polyp   . GERD (gastroesophageal reflux disease)    Has LPR    Past Surgical History:  Procedure Laterality Date  . CATARACT EXTRACTION     2011, other in 2013  . COLONOSCOPY   2011    Family History  Problem Relation Age of Onset  . Hypertension Mother   . Heart disease Neg Hx   . Diabetes Neg Hx   . Cancer Neg Hx     Social History   Socioeconomic History  . Marital status: Divorced    Spouse name: Not on file  . Number of children: 2  . Years of education: Not on file  . Highest education level: Not on file  Occupational History  . Occupation: retired--car dealer/sales  Tobacco Use  . Smoking status: Former Smoker    Packs/day: 2.00    Years: 50.00    Pack years: 100.00    Types: Pipe    Quit date: 10/20/2014    Years since quitting: 5.1  . Smokeless tobacco: Never Used  . Tobacco comment: pipe   Substance and Sexual Activity  . Alcohol use: No    Alcohol/week: 0.0 standard drinks  . Drug use: No  . Sexual activity: Not on file  Other Topics Concern  . Not on file  Social History Narrative   Has living will   Has no health care POA--would favor both of his sons equally (Primary is Robert Vega)   Would accept resuscitation and tube feedings if appropriate   Social Determinants of Health   Financial Resource Strain:   . Difficulty of Paying Living Expenses:   Food  Insecurity:   . Worried About Charity fundraiser in the Last Year:   . Arboriculturist in the Last Year:   Transportation Needs:   . Film/video editor (Medical):   Marland Kitchen Lack of Transportation (Non-Medical):   Physical Activity:   . Days of Exercise per Week:   . Minutes of Exercise per Session:   Stress:   . Feeling of Stress :   Social Connections:   . Frequency of Communication with Friends and Family:   . Frequency of Social Gatherings with Friends and Family:   . Attends Religious Services:   . Active Member of Clubs or Organizations:   . Attends Archivist Meetings:   Marland Kitchen Marital Status:   Intimate Partner Violence:   . Fear of Current or Ex-Partner:   . Emotionally Abused:   Marland Kitchen Physically Abused:   . Sexually Abused:    Review of Systems  Now having  problems with ED--now seeing someone (who he knew back from 4th grade) Appetite is fine--eats late at times (snack) No sig voice changes    Objective:   Physical Exam Constitutional:      Appearance: Normal appearance.  Cardiovascular:     Rate and Rhythm: Normal rate and regular rhythm.     Heart sounds: No murmur heard.  No gallop.   Pulmonary:     Effort: Pulmonary effort is normal.     Breath sounds: Normal breath sounds. No wheezing or rales.  Abdominal:     Palpations: Abdomen is soft. There is no mass.     Tenderness: There is no abdominal tenderness.  Musculoskeletal:     Cervical back: Neck supple.     Right lower leg: No edema.     Left lower leg: No edema.  Lymphadenopathy:     Cervical: No cervical adenopathy.  Neurological:     Mental Status: He is alert.  Psychiatric:        Mood and Affect: Mood normal.        Behavior: Behavior normal.            Assessment & Plan:

## 2019-12-23 NOTE — Patient Instructions (Signed)
Please take the omeprazole every day on an empty stomach. Continue until we meet again (you may need it on a regular basis forever). You can try melatonin-- 3-5mg  close to bedtime to see if that might help my sleep.

## 2019-12-23 NOTE — Assessment & Plan Note (Addendum)
Clearly increased symptoms now LPR and night symptoms are prominent Now with some dysphagia Discussed needing PPI regularly Discussed not eating close to bedtime and consider elevating HOB

## 2019-12-23 NOTE — Assessment & Plan Note (Signed)
Mood is better In new relationship--will Rx cialis to try for sex

## 2020-03-22 DIAGNOSIS — H353131 Nonexudative age-related macular degeneration, bilateral, early dry stage: Secondary | ICD-10-CM | POA: Diagnosis not present

## 2020-04-26 ENCOUNTER — Ambulatory Visit (INDEPENDENT_AMBULATORY_CARE_PROVIDER_SITE_OTHER): Payer: Medicare Other | Admitting: Internal Medicine

## 2020-04-26 ENCOUNTER — Other Ambulatory Visit: Payer: Self-pay

## 2020-04-26 ENCOUNTER — Encounter: Payer: Self-pay | Admitting: Internal Medicine

## 2020-04-26 VITALS — BP 138/84 | HR 65 | Temp 97.9°F | Ht 69.75 in | Wt 238.0 lb

## 2020-04-26 DIAGNOSIS — Z0000 Encounter for general adult medical examination without abnormal findings: Secondary | ICD-10-CM | POA: Diagnosis not present

## 2020-04-26 DIAGNOSIS — Z7189 Other specified counseling: Secondary | ICD-10-CM

## 2020-04-26 DIAGNOSIS — N401 Enlarged prostate with lower urinary tract symptoms: Secondary | ICD-10-CM | POA: Diagnosis not present

## 2020-04-26 DIAGNOSIS — K2100 Gastro-esophageal reflux disease with esophagitis, without bleeding: Secondary | ICD-10-CM | POA: Diagnosis not present

## 2020-04-26 DIAGNOSIS — R200 Anesthesia of skin: Secondary | ICD-10-CM | POA: Diagnosis not present

## 2020-04-26 DIAGNOSIS — F39 Unspecified mood [affective] disorder: Secondary | ICD-10-CM | POA: Diagnosis not present

## 2020-04-26 LAB — COMPREHENSIVE METABOLIC PANEL
ALT: 13 U/L (ref 0–53)
AST: 22 U/L (ref 0–37)
Albumin: 4.3 g/dL (ref 3.5–5.2)
Alkaline Phosphatase: 63 U/L (ref 39–117)
BUN: 19 mg/dL (ref 6–23)
CO2: 32 mEq/L (ref 19–32)
Calcium: 9.1 mg/dL (ref 8.4–10.5)
Chloride: 99 mEq/L (ref 96–112)
Creatinine, Ser: 0.96 mg/dL (ref 0.40–1.50)
GFR: 74.32 mL/min (ref >60.00)
Glucose, Bld: 94 mg/dL (ref 70–99)
Potassium: 4.4 mEq/L (ref 3.5–5.1)
Sodium: 136 mEq/L (ref 135–145)
Total Bilirubin: 0.4 mg/dL (ref 0.2–1.2)
Total Protein: 7.7 g/dL (ref 6.0–8.3)

## 2020-04-26 LAB — CBC
HCT: 38 % — ABNORMAL LOW (ref 39.0–52.0)
Hemoglobin: 13.1 g/dL (ref 13.0–17.0)
MCHC: 34.4 g/dL (ref 30.0–36.0)
MCV: 93 fl (ref 78.0–100.0)
Platelets: 235 10*3/uL (ref 150.0–400.0)
RBC: 4.08 Mil/uL — ABNORMAL LOW (ref 4.22–5.81)
RDW: 14.9 % (ref 11.5–15.5)
WBC: 7.2 10*3/uL (ref 4.0–10.5)

## 2020-04-26 LAB — T4, FREE: Free T4: 0.71 ng/dL (ref 0.60–1.60)

## 2020-04-26 LAB — VITAMIN B12: Vitamin B-12: 346 pg/mL (ref 211–911)

## 2020-04-26 MED ORDER — OMEPRAZOLE 20 MG PO CPDR: 20.0000 mg | DELAYED_RELEASE_CAPSULE | Freq: Two times a day (BID) | ORAL | 3 refills | Status: DC

## 2020-04-26 NOTE — Assessment & Plan Note (Signed)
Better now with his ongoing positive relationship

## 2020-04-26 NOTE — Assessment & Plan Note (Signed)
If ongoing symptoms worsen, will try tamsulosin

## 2020-04-26 NOTE — Assessment & Plan Note (Signed)
See social history 

## 2020-04-26 NOTE — Progress Notes (Signed)
Hearing Screening   Method: Audiometry   125Hz 250Hz 500Hz 1000Hz 2000Hz 3000Hz 4000Hz 6000Hz 8000Hz  Right ear:   20 20 20  20    Left ear:   20 20 20  20    Vision Screening Comments: October 2021   

## 2020-04-26 NOTE — Progress Notes (Signed)
Subjective:    Patient ID: Robert Vega, male    DOB: Jun 07, 1940, 79 y.o.   MRN: [id number]  HPI Here for Medicare wellness visit and follow up of chronic health conditions This visit occurred during the SARS-CoV-2 public health emergency.  Safety protocols were in place, including screening questions prior to the visit, additional usage of staff PPE, and extensive cleaning of exam room while observing appropriate contact time as indicated for disinfecting solutions.   Reviewed form and advanced directives Reviewed other doctors Stays active with large garden (farms 1.25 acres of tomatoes, cucumbers. Okra, etc). Does other yard work No tobacco now--quit 2015 No alcohol or rare wine Vision is okay with glasses Hearing seems fine Independent with instrumental ADLs No change in mild memory issues---long standing  Still has some trouble with the reflux Still will have sense of dysphagia while eating--though better Gets sense he has to make himself burp (and this helps symptoms) Only 50% better on the PPI  Awakens with sense of trouble breathing--another time (like an hour after going to sleep) Couldn't use fluticasone---does try saline spray at night. This has helped  No chest pain No SOB in day No edema No palpitations Some dizziness with cialis----but it did help some Some dizziness if he gets up too quick also. No syncope  Has noticed occasional tingling/sensory change in plantar feet More sensitive now  Voids okay Only sleeps about 5-6 hours at night---will awaken to void and trouble getting back to sleep Not having serious daytime tiredness Flow is okay during the day  Mood is better No depression or anhedonia Happy in new relationship  Not living together---that will not happen  Current Outpatient Medications on File Prior to Visit  Medication Sig Dispense Refill  . acetaminophen (TYLENOL) 650 MG CR tablet Take 650 mg by mouth at bedtime.    Marland Kitchen aspirin 81 MG  tablet Take 81 mg by mouth daily.      . fish oil-omega-3 fatty acids 1000 MG capsule Take 1 g by mouth daily.    Marland Kitchen GLUCOSAMINE-CHONDROITIN-MSM PO Take by mouth.    . Multiple Vitamins-Minerals (MULTIVITAMIN WITH MINERALS) tablet Take 1 tablet by mouth daily.    Marland Kitchen omeprazole (PRILOSEC) 20 MG capsule Take 1 capsule (20 mg total) by mouth daily. 90 capsule 3  . tadalafil (CIALIS) 20 MG tablet Take 0.5-1 tablets (10-20 mg total) by mouth every other day as needed for erectile dysfunction. 5 tablet 11   No current facility-administered medications on file prior to visit.    Allergies  Allergen Reactions  . Sulfonamide Derivatives     Past Medical History:  Diagnosis Date  . Allergy   . Arthritis   . BPH (benign prostatic hypertrophy)   . Colonic polyp   . GERD (gastroesophageal reflux disease)    Has LPR    Past Surgical History:  Procedure Laterality Date  . CATARACT EXTRACTION     2011, other in 2013  . COLONOSCOPY  2011    Family History  Problem Relation Age of Onset  . Hypertension Mother   . Heart disease Neg Hx   . Diabetes Neg Hx   . Cancer Neg Hx     Social History   Socioeconomic History  . Marital status: Divorced    Spouse name: Not on file  . Number of children: 2  . Years of education: Not on file  . Highest education level: Not on file  Occupational History  . Occupation: retired--car dealer/sales  Tobacco Use  . Smoking status: Former Smoker    Packs/day: 2.00    Years: 50.00    Pack years: 100.00    Types: Pipe    Quit date: 10/20/2014    Years since quitting: 5.5  . Smokeless tobacco: Never Used  . Tobacco comment: pipe   Substance and Sexual Activity  . Alcohol use: No    Alcohol/week: 0.0 standard drinks  . Drug use: No  . Sexual activity: Not on file  Other Topics Concern  . Not on file  Social History Narrative   Has living will   Two sons and granddaughter are health care POAs (GD is first)   Would accept resuscitation and tube  feedings if appropriate   Social Determinants of Health   Financial Resource Strain:   . Difficulty of Paying Living Expenses: Not on file  Food Insecurity:   . Worried About Charity fundraiser in the Last Year: Not on file  . Ran Out of Food in the Last Year: Not on file  Transportation Needs:   . Lack of Transportation (Medical): Not on file  . Lack of Transportation (Non-Medical): Not on file  Physical Activity:   . Days of Exercise per Week: Not on file  . Minutes of Exercise per Session: Not on file  Stress:   . Feeling of Stress : Not on file  Social Connections:   . Frequency of Communication with Friends and Family: Not on file  . Frequency of Social Gatherings with Friends and Family: Not on file  . Attends Religious Services: Not on file  . Active Member of Clubs or Organizations: Not on file  . Attends Archivist Meetings: Not on file  . Marital Status: Not on file  Intimate Partner Violence:   . Fear of Current or Ex-Partner: Not on file  . Emotionally Abused: Not on file  . Physically Abused: Not on file  . Sexually Abused: Not on file   Review of Systems Appetite is fine Weight is stable--did get it down some in the summer Some sleep issues Wears seat belt Some teeth problems---"serviceable" ---works with the dentist No suspicious skin lesions Bowels are fine--no blood No sig back or joint pains---does feel his back more with a lot of bending (but still only uses the tylenol at night)    Objective:   Physical Exam Constitutional:      Appearance: Normal appearance.  HENT:     Mouth/Throat:     Comments: No lesions  Eyes:     Conjunctiva/sclera: Conjunctivae normal.     Pupils: Pupils are equal, round, and reactive to light.  Cardiovascular:     Rate and Rhythm: Normal rate and regular rhythm.     Pulses: Normal pulses.     Heart sounds: No murmur heard.  No gallop.   Pulmonary:     Effort: Pulmonary effort is normal.     Breath sounds:  Normal breath sounds. No wheezing or rales.  Abdominal:     Palpations: Abdomen is soft.     Tenderness: There is no abdominal tenderness.  Musculoskeletal:     Cervical back: Neck supple.     Right lower leg: No edema.     Left lower leg: No edema.  Lymphadenopathy:     Cervical: No cervical adenopathy.  Skin:    General: Skin is warm.     Findings: No rash.  Neurological:     Mental Status: He is alert and oriented to  person, place, and time.     Comments: President-- "Biden, Trump, the African American, Bush" [id number] Recall 2/3  Fairly normal sensation in plantar feet  Psychiatric:        Mood and Affect: Mood normal.        Behavior: Behavior normal.            Assessment & Plan:

## 2020-04-26 NOTE — Assessment & Plan Note (Signed)
I have personally reviewed the Medicare Annual Wellness questionnaire and have noted 1. The patient's medical and social history 2. Their use of alcohol, tobacco or illicit drugs 3. Their current medications and supplements 4. The patient's functional ability including ADL's, fall risks, home safety risks and hearing or visual             impairment. 5. Diet and physical activities 6. Evidence for depression or mood disorders  The patients weight, height, BMI and visual acuity have been recorded in the chart I have made referrals, counseling and provided education to the patient based review of the above and I have provided the pt with a written personalized care plan for preventive services.  I have provided you with a copy of your personalized plan for preventive services. Please take the time to review along with your updated medication list.  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

## 2020-04-26 NOTE — Assessment & Plan Note (Signed)
Mild °Will check labs °

## 2020-04-26 NOTE — Assessment & Plan Note (Signed)
Better on the PPI but still symptomatic Will double the omeprazole and set up with GI

## 2020-04-28 LAB — PROTEIN ELECTROPHORESIS, SERUM, WITH REFLEX
Albumin ELP: 4.2 g/dL (ref 3.8–4.8)
Alpha 1: 0.3 g/dL (ref 0.2–0.3)
Alpha 2: 0.9 g/dL (ref 0.5–0.9)
Beta 2: 0.4 g/dL (ref 0.2–0.5)
Beta Globulin: 0.5 g/dL (ref 0.4–0.6)
Gamma Globulin: 1.4 g/dL (ref 0.8–1.7)
Total Protein: 7.6 g/dL (ref 6.1–8.1)

## 2020-05-23 ENCOUNTER — Telehealth: Payer: Self-pay

## 2020-05-23 NOTE — Telephone Encounter (Signed)
Pt called the office to report elevated BP readings...   Fri morning he had some dizziness 177/95 after standing lasting for about 30 minutes... he had to sit and recheck 2 hrs later 150s/?... Using a wrist BP cuff  05/21/2020 171/85 05/22/2020 165/75 05/23/2020  152/67--- rechecked 147/53

## 2020-05-23 NOTE — Telephone Encounter (Signed)
Spoke to pt. He is not feeling dizzy. He says he will continue to monitor his BP and if it stays up he will make an OV. He is watching his diet better.

## 2020-05-23 NOTE — Telephone Encounter (Signed)
I generally don't trust wrist cuffs. No action if he feels okay----his BP always fine here Needs to be seen if the dizziness persists

## 2020-06-22 ENCOUNTER — Other Ambulatory Visit: Payer: Self-pay

## 2020-06-22 ENCOUNTER — Encounter: Payer: Self-pay | Admitting: Gastroenterology

## 2020-06-22 ENCOUNTER — Ambulatory Visit: Payer: Medicare Other | Admitting: Gastroenterology

## 2020-06-22 VITALS — BP 157/90 | HR 80 | Temp 98.7°F

## 2020-06-22 DIAGNOSIS — K219 Gastro-esophageal reflux disease without esophagitis: Secondary | ICD-10-CM | POA: Diagnosis not present

## 2020-06-22 DIAGNOSIS — R1310 Dysphagia, unspecified: Secondary | ICD-10-CM | POA: Diagnosis not present

## 2020-06-22 NOTE — Progress Notes (Signed)
Jonathon Bellows MD, MRCP(U.K) [address]  [place]  [place], [place] [address]  Main: [phone]  Fax: [phone]   Gastroenterology Consultation  Referring Provider:     [name], MD Primary Care Physician:  [name], MD Primary Gastroenterologist:  Dr. Jonathon Bellows  Reason for Consultation:     GERD        HPI:   Robert Vega is a 80 y.o. y/o male referred for consultation & management  by Dr. [name], MD.     Seen by Dr. Silvio Pate in November 2021 with reflux.  Some history of concerning for dysphagia.  Hence referred to see me.  He states that for the past few months has been having difficulty swallowing solids and liquids.  Gets stuck in the center of his chest.  Associated regurgitation.  Ex-smoker.  Quit in 2016.  Denies any weight loss.  A lot of belching and burping.  Consumes 3 sachets of Splenda daily no other artificial sugars.  History of heartburn.  He has been started on a PPI which has helped to a decent degree.   Past Medical History:  Diagnosis Date  . Allergy   . Arthritis   . BPH (benign prostatic hypertrophy)   . Colonic polyp   . GERD (gastroesophageal reflux disease)    Has LPR    Past Surgical History:  Procedure Laterality Date  . CATARACT EXTRACTION     2011, other in 2013  . COLONOSCOPY  2011    Prior to Admission medications   Medication Sig Start Date End Date Taking? Authorizing Provider  acetaminophen (TYLENOL) 650 MG CR tablet Take 650 mg by mouth at bedtime.    [provider]  aspirin 81 MG tablet Take 81 mg by mouth daily.      [provider]  fish oil-omega-3 fatty acids 1000 MG capsule Take 1 g by mouth daily.    [provider]  GLUCOSAMINE-CHONDROITIN-MSM PO Take by mouth.    [provider]  Multiple Vitamins-Minerals (MULTIVITAMIN WITH MINERALS) tablet Take 1 tablet by mouth daily.    [provider]  omeprazole (PRILOSEC) 20 MG capsule Take 1  capsule (20 mg total) by mouth 2 (two) times daily before a meal. 04/26/20   [name], MD  tadalafil (CIALIS) 20 MG tablet Take 0.5-1 tablets (10-20 mg total) by mouth every other day as needed for erectile dysfunction. 12/23/19   [name], MD  triamcinolone ointment (KENALOG) 0.1 % SMARTSIG:1 Application Topical 2-3 Times Daily 05/04/20   [provider]    Family History  Problem Relation Age of Onset  . Hypertension Mother   . Heart disease Neg Hx   . Diabetes Neg Hx   . Cancer Neg Hx      Social History   Tobacco Use  . Smoking status: Former Smoker    Packs/day: 2.00    Years: 50.00    Pack years: 100.00    Types: Pipe    Quit date: 10/20/2014    Years since quitting: 5.6  . Smokeless tobacco: Never Used  . Tobacco comment: pipe   Substance Use Topics  . Alcohol use: No    Alcohol/week: 0.0 standard drinks  . Drug use: No    Allergies as of 06/22/2020 - Review Complete 06/22/2020  Allergen Reaction Noted  . Sulfonamide derivatives  04/17/2007    Review of Systems:    All systems reviewed and negative except where noted  in HPI.   Physical Exam:  BP (!) 157/90 (BP Location: Left Arm, Patient Position: Sitting)   Pulse 80   Temp 98.7 F (37.1 C) (Oral)  No LMP for male patient. Psych:  Alert and cooperative. Normal mood and affect. General:   Alert,  Well-developed, well-nourished, pleasant and cooperative in NAD Head:  Normocephalic and atraumatic. Eyes:  Sclera clear, no icterus.   Conjunctiva pink. Ears:  Normal auditory acuity. Lungs:  Respirations even and unlabored.  Clear throughout to auscultation.   No wheezes, crackles, or rhonchi. No acute distress. Heart:  Regular rate and rhythm; no murmurs, clicks, rubs, or gallops. Abdomen:  Normal bowel sounds.  No bruits.  Soft, non-tender and non-distended without masses, hepatosplenomegaly or hernias noted.  No guarding or rebound tenderness.    Neurologic:  Alert and oriented x3;   grossly normal neurologically. Psych:  Alert and cooperative. Normal mood and affect.  Imaging Studies: No results found.  Assessment and Plan:   Robert Vega is a 80 y.o. y/o male has been referred for GERD.  Recent history of dysphagia.  Burping and belching likely secondary to use of artificial sugars.  Plan 1.  Continue PPI 2.  Schedule EGD to rule out any obstructing lesion 3.  Avoid all artificial sugars to see if it helps with the burping and belching  I have discussed alternative options, risks & benefits,  which include, but are not limited to, bleeding, infection, perforation,respiratory complication & drug reaction.  The patient agrees with this plan & written consent will be obtained.      Follow up in 6 weeks  Dr Jonathon Bellows MD,MRCP(U.K)

## 2020-06-29 ENCOUNTER — Telehealth: Payer: Self-pay

## 2020-06-29 NOTE — Telephone Encounter (Signed)
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  He said he will see you in March for his follow up appt.  Thanks,  [place], [place]

## 2020-08-24 ENCOUNTER — Other Ambulatory Visit: Payer: Self-pay

## 2020-08-24 ENCOUNTER — Ambulatory Visit: Payer: Medicare Other | Admitting: Gastroenterology

## 2020-08-24 ENCOUNTER — Encounter: Payer: Self-pay | Admitting: Gastroenterology

## 2020-08-24 VITALS — BP 163/95 | HR 73 | Ht 69.75 in | Wt 248.0 lb

## 2020-08-24 DIAGNOSIS — K219 Gastro-esophageal reflux disease without esophagitis: Secondary | ICD-10-CM | POA: Diagnosis not present

## 2020-08-24 DIAGNOSIS — R1310 Dysphagia, unspecified: Secondary | ICD-10-CM | POA: Diagnosis not present

## 2020-08-24 DIAGNOSIS — R142 Eructation: Secondary | ICD-10-CM | POA: Diagnosis not present

## 2020-08-24 NOTE — Progress Notes (Signed)
Jonathon Bellows MD, MRCP(U.K) [address]  [place], [place] [address]  Main: [phone]  Fax: [phone]   Primary Care Physician: [name], MD  Primary Gastroenterologist:  Dr. Jonathon Bellows    GERD follow-up   HPI: Robert Vega is a 80 y.o. male    Summary of history :  Initially seen and referred in January 2022 for acid reflux.  Some history of dysphagia.  He stated that for the past few months he has been having difficulty swallowing solids and liquids.  It is getting stuck in the center of his chest.  Ex-smoker.  Quit in 2016.  Denies any weight loss.  A lot of belching and burping.  Consumes 3 sachets of Splenda daily no other artificial sugars.  History of heartburn.  He has been started on a PPI which has helped to a decent degree.  Interval history 06/22/2020-08/24/2020   Since the last visit he states the belching and burping has been significantly improved after he stopped the Splenda.  Still has some occasional dysphagia but much better after starting the PPI.  He does complain some drainage back of his throat and he states he has had issues with sinus  Current Outpatient Medications  Medication Sig Dispense Refill  . acetaminophen (TYLENOL) 650 MG CR tablet Take 650 mg by mouth at bedtime.    Marland Kitchen aspirin 81 MG tablet Take 81 mg by mouth daily.    . fish oil-omega-3 fatty acids 1000 MG capsule Take 1 g by mouth daily.    Marland Kitchen GLUCOSAMINE-CHONDROITIN-MSM PO Take by mouth.    . Multiple Vitamins-Minerals (MULTIVITAMIN WITH MINERALS) tablet Take 1 tablet by mouth daily.    Marland Kitchen omeprazole (PRILOSEC) 20 MG capsule Take 1 capsule (20 mg total) by mouth 2 (two) times daily before a meal. 180 capsule 3  . tadalafil (CIALIS) 20 MG tablet Take 0.5-1 tablets (10-20 mg total) by mouth every other day as needed for erectile dysfunction. 5 tablet 11  . triamcinolone ointment (KENALOG) 0.1 % SMARTSIG:1 Application Topical 2-3 Times Daily (Patient not taking: No  sig reported)     No current facility-administered medications for this visit.    Allergies as of 08/24/2020 - Review Complete 08/24/2020  Allergen Reaction Noted  . Sulfonamide derivatives  04/17/2007    ROS:  General: Negative for anorexia, weight loss, fever, chills, fatigue, weakness. ENT: Negative for hoarseness, difficulty swallowing , nasal congestion. CV: Negative for chest pain, angina, palpitations, dyspnea on exertion, peripheral edema.  Respiratory: Negative for dyspnea at rest, dyspnea on exertion, cough, sputum, wheezing.  GI: See history of present illness. GU:  Negative for dysuria, hematuria, urinary incontinence, urinary frequency, nocturnal urination.  Endo: Negative for unusual weight change.    Physical Examination:   BP (!) 163/95 (BP Location: Left Arm, Patient Position: Sitting, Cuff Size: Large)   Pulse 73   Ht 5' 9.75" (1.772 m)   Wt 248 lb (112.5 kg)   BMI 35.84 kg/m   General: Well-nourished, well-developed in no acute distress.  Eyes: No icterus. Conjunctivae pink. Mouth: Oropharyngeal mucosa moist and pink , no lesions erythema or exudate. Lungs: Clear to auscultation bilaterally. Non-labored. Heart: Regular rate and rhythm, no murmurs rubs or gallops.  Abdomen: Bowel sounds are normal, nontender, nondistended, no hepatosplenomegaly or masses, no abdominal bruits or hernia , no rebound or guarding.   Extremities: No lower extremity edema. No clubbing or deformities. Neuro: Alert and oriented x 3.  Grossly intact. Skin:  Warm and dry, no jaundice.   Psych: Alert and cooperative, normal mood and affect.   Imaging Studies: No results found.  Assessment and Plan:   Robert Vega is a 80 y.o. y/o male here to follow-up for GERD.  Recent history of dysphagia.  Burping and belching likely secondary to use of artificial sugars.  Once he stopped the artificial sugars all his belching and burping significantly improved.  Dysphagia much improved with  the PPI but still persists he does complain of drainage to the back of his throat from his sinuses.  Plan 1.  Continue PPI 2.  Schedule EGD to rule out any obstructing lesion such as a stricture or Schatzki's ring 3.  I offered him referral to ENT for drainage of the back of his throat which could be from chronic sinusitis.  He said he will let me know when he is ready for that. 4.  Continue staying off artificial sugars  I have discussed alternative options, risks & benefits,  which include, but are not limited to, bleeding, infection, perforation,respiratory complication & drug reaction.  The patient agrees with this plan & written consent will be obtained.      Dr Jonathon Bellows  MD,MRCP [facility]) Follow up in 3 months

## 2020-08-25 ENCOUNTER — Telehealth: Payer: Self-pay

## 2020-08-25 NOTE — Telephone Encounter (Signed)
Patient called with questions regarding the procedure that is scheduled. Answered all questions and pt verbalized understanding.

## 2020-08-26 ENCOUNTER — Other Ambulatory Visit
Admission: RE | Admit: 2020-08-26 | Discharge: 2020-08-26 | Disposition: A | Discharge status: Home / Self Care | Payer: Medicare Other | Source: Ambulatory Visit | Attending: Gastroenterology | Admitting: Gastroenterology

## 2020-08-26 ENCOUNTER — Other Ambulatory Visit: Payer: Self-pay

## 2020-08-26 DIAGNOSIS — Z01812 Encounter for preprocedural laboratory examination: Secondary | ICD-10-CM | POA: Diagnosis not present

## 2020-08-26 DIAGNOSIS — Z20822 Contact with and (suspected) exposure to covid-19: Secondary | ICD-10-CM | POA: Insufficient documentation

## 2020-08-26 LAB — SARS CORONAVIRUS 2 (TAT 6-24 HRS): SARS Coronavirus 2: NEGATIVE

## 2020-08-29 ENCOUNTER — Encounter: Payer: Self-pay | Admitting: Gastroenterology

## 2020-08-30 ENCOUNTER — Encounter
Admission: RE | Disposition: A | Discharge status: Home / Self Care | Payer: Self-pay | Source: Ambulatory Visit | Attending: Gastroenterology

## 2020-08-30 ENCOUNTER — Other Ambulatory Visit: Payer: Self-pay

## 2020-08-30 ENCOUNTER — Ambulatory Visit: Payer: Medicare Other | Admitting: Certified Registered Nurse Anesthetist

## 2020-08-30 ENCOUNTER — Encounter: Payer: Self-pay | Admitting: Gastroenterology

## 2020-08-30 ENCOUNTER — Ambulatory Visit
Admission: RE | Admit: 2020-08-30 | Discharge: 2020-08-30 | Disposition: A | Discharge status: Home / Self Care | Payer: Medicare Other | Source: Ambulatory Visit | Attending: Gastroenterology | Admitting: Gastroenterology

## 2020-08-30 DIAGNOSIS — Z87891 Personal history of nicotine dependence: Secondary | ICD-10-CM | POA: Insufficient documentation

## 2020-08-30 DIAGNOSIS — R1310 Dysphagia, unspecified: Secondary | ICD-10-CM | POA: Diagnosis not present

## 2020-08-30 DIAGNOSIS — Z882 Allergy status to sulfonamides status: Secondary | ICD-10-CM | POA: Diagnosis not present

## 2020-08-30 DIAGNOSIS — Z7982 Long term (current) use of aspirin: Secondary | ICD-10-CM | POA: Insufficient documentation

## 2020-08-30 DIAGNOSIS — K2100 Gastro-esophageal reflux disease with esophagitis, without bleeding: Secondary | ICD-10-CM | POA: Diagnosis not present

## 2020-08-30 DIAGNOSIS — Z79899 Other long term (current) drug therapy: Secondary | ICD-10-CM | POA: Diagnosis not present

## 2020-08-30 DIAGNOSIS — K224 Dyskinesia of esophagus: Secondary | ICD-10-CM | POA: Diagnosis not present

## 2020-08-30 DIAGNOSIS — Z86010 Personal history of colonic polyps: Secondary | ICD-10-CM | POA: Insufficient documentation

## 2020-08-30 DIAGNOSIS — N400 Enlarged prostate without lower urinary tract symptoms: Secondary | ICD-10-CM | POA: Insufficient documentation

## 2020-08-30 DIAGNOSIS — Z8249 Family history of ischemic heart disease and other diseases of the circulatory system: Secondary | ICD-10-CM | POA: Insufficient documentation

## 2020-08-30 HISTORY — PX: ESOPHAGOGASTRODUODENOSCOPY (EGD) WITH PROPOFOL: SHX5813

## 2020-08-30 SURGERY — ESOPHAGOGASTRODUODENOSCOPY (EGD) WITH PROPOFOL
Anesthesia: General

## 2020-08-30 MED ORDER — PROPOFOL 10 MG/ML IV BOLUS
INTRAVENOUS | Status: DC | PRN
  Administered 2020-08-30 (×2): 50 mg via INTRAVENOUS

## 2020-08-30 MED ORDER — SODIUM CHLORIDE 0.9 % IV SOLN: INTRAVENOUS | Status: DC

## 2020-08-30 MED ORDER — LIDOCAINE HCL (CARDIAC) PF 100 MG/5ML IV SOSY
PREFILLED_SYRINGE | INTRAVENOUS | Status: DC | PRN
  Administered 2020-08-30: 100 mg via INTRAVENOUS

## 2020-08-30 MED ORDER — PROPOFOL 10 MG/ML IV BOLUS
INTRAVENOUS | Status: AC
  Filled 2020-08-30: qty 80

## 2020-08-30 NOTE — Op Note (Signed)
[facility] Gastroenterology Patient Name: Robert Vega Procedure Date: 08/30/2020 8:34 AM MRN: [id number] Account #: [id number] Date of Birth: Jan 16, 1941 Admit Type: Outpatient Age: 80 Room: [facility] ENDO ROOM 2 Gender: Male Note Status: Finalized Procedure:             Upper GI endoscopy Indications:           Dysphagia Providers:             Jonathon Bellows MD, MD Referring MD:          [name] (Referring MD) Medicines:             Monitored Anesthesia Care Complications:         No immediate complications. Procedure:             Pre-Anesthesia Assessment:                        - Prior to the procedure, a History and Physical was                         performed, and patient medications, allergies and                         sensitivities were reviewed. The patient's tolerance                         of previous anesthesia was reviewed.                        - The risks and benefits of the procedure and the                         sedation options and risks were discussed with the                         patient. All questions were answered and informed                         consent was obtained.                        - ASA Grade Assessment: II - A patient with mild                         systemic disease.                        After obtaining informed consent, the endoscope was                         passed under direct vision. Throughout the procedure,                         the patient's blood pressure, pulse, and oxygen                         saturations were monitored continuously. The Endoscope                         was introduced through the mouth, and advanced to  the                         third part of duodenum. The upper GI endoscopy was                         accomplished with ease. The patient tolerated the                         procedure well. Findings:      Abnormal motility was noted in the mid esophagus. The cricopharyngeus        was abnormal. There is a decrease in motility of the esophageal body.       The distal esophagus/lower esophageal sphincter is spastic, but gives up       passage to the endoscope. Biopsies were taken with a cold forceps for       histology. A TTS dilator was passed through the scope. Dilation with a       15-16.5-18 mm balloon dilator was performed to 18 mm. The dilation site       was examined and showed no change.      The stomach was normal.      The examined duodenum was normal.      The cardia and gastric fundus were normal on retroflexion. Impression:            - Abnormal esophageal motility. Biopsied. Dilated.                        - Normal stomach.                        - Normal examined duodenum. Recommendation:        - Discharge patient to home (with escort).                        - Resume previous diet.                        - Continue present medications.                        - Await pathology results.                        - Perform ambulatory esophageal manometry in 6 weeks. Procedure Code(s):     --- Professional ---                        [phone], Esophagogastroduodenoscopy, flexible,                         transoral; with transendoscopic balloon dilation of                         esophagus (less than 30 mm diameter)                        43239, 59, Esophagogastroduodenoscopy, flexible,                         transoral; with biopsy, single or multiple Diagnosis Code(s):     --- Professional ---  K22.4, Dyskinesia of esophagus                        R13.10, Dysphagia, unspecified CPT copyright 2019 American Medical Association. All rights reserved. The codes documented in this report are preliminary and upon coder review may  be revised to meet current compliance requirements. Jonathon Bellows, MD Jonathon Bellows MD, MD 08/30/2020 8:47:02 AM This report has been signed electronically. Number of Addenda: 0 Note Initiated On: 08/30/2020 8:34  AM Estimated Blood Loss:  Estimated blood loss: none.      [facility]

## 2020-08-30 NOTE — Anesthesia Preprocedure Evaluation (Signed)
Anesthesia Evaluation  Patient identified by MRN, date of birth, ID band Patient awake    Reviewed: Allergy & Precautions, NPO status , Patient's Chart, lab work & pertinent test results  History of Anesthesia Complications Negative for: history of anesthetic complications  Airway Mallampati: II  TM Distance: >3 FB Neck ROM: Full    Dental  (+) Poor Dentition   Pulmonary neg sleep apnea, neg COPD, former smoker,    breath sounds clear to auscultation- rhonchi (-) wheezing      Cardiovascular (-) hypertension(-) CAD, (-) Past MI, (-) Cardiac Stents and (-) CABG  Rhythm:Regular Rate:Normal - Systolic murmurs and - Diastolic murmurs    Neuro/Psych neg Seizures PSYCHIATRIC DISORDERS negative neurological ROS     GI/Hepatic Neg liver ROS, GERD  ,  Endo/Other  negative endocrine ROS  Renal/GU negative Renal ROS     Musculoskeletal  (+) Arthritis ,   Abdominal (+) + obese,   Peds  Hematology negative hematology ROS (+)   Anesthesia Other Findings Past Medical History: No date: Allergy No date: Arthritis No date: BPH (benign prostatic hypertrophy) No date: Colonic polyp No date: GERD (gastroesophageal reflux disease)     Comment:  Has LPR   Reproductive/Obstetrics                             Anesthesia Physical Anesthesia Plan  ASA: II  Anesthesia Plan: General   Post-op Pain Management:    Induction: Intravenous  PONV Risk Score and Plan: 1 and Propofol infusion  Airway Management Planned: Natural Airway  Additional Equipment:   Intra-op Plan:   Post-operative Plan:   Informed Consent: I have reviewed the patients History and Physical, chart, labs and discussed the procedure including the risks, benefits and alternatives for the proposed anesthesia with the patient or authorized representative who has indicated his/her understanding and acceptance.     Dental advisory  given  Plan Discussed with: CRNA and Anesthesiologist  Anesthesia Plan Comments:         Anesthesia Quick Evaluation

## 2020-08-30 NOTE — Transfer of Care (Signed)
Immediate Anesthesia Transfer of Care Note  Patient: Robert Vega  Procedure(s) Performed: ESOPHAGOGASTRODUODENOSCOPY (EGD) WITH PROPOFOL (N/A )  Patient Location: PACU and Endoscopy Unit  Anesthesia Type:General  Level of Consciousness: awake, drowsy and patient cooperative  Airway & Oxygen Therapy: Patient Spontanous Breathing  Post-op Assessment: Report given to RN and Post -op Vital signs reviewed and stable  Post vital signs: Reviewed and stable  Last Vitals:  Vitals Value Taken Time  BP 111/72 08/30/20 0850  Temp    Pulse 68 08/30/20 0851  Resp 15 08/30/20 0851  SpO2 95 % 08/30/20 0851  Vitals shown include unvalidated device data.  Last Pain:  Vitals:   08/30/20 0850  TempSrc:   PainSc: Asleep         Complications: No complications documented.

## 2020-08-30 NOTE — H&P (Signed)
Jonathon Bellows, MD [address], [place], [place], [place], [address] [address], [place], [place], [place], [address] Phone: [phone]  Fax: [phone]  Primary Care Physician:  [name], MD   Pre-Procedure History & Physical: HPI:  PRINSTON KYNARD is a 80 y.o. male is here for an endoscopy    Past Medical History:  Diagnosis Date  . Allergy   . Arthritis   . BPH (benign prostatic hypertrophy)   . Colonic polyp   . GERD (gastroesophageal reflux disease)    Has LPR    Past Surgical History:  Procedure Laterality Date  . CATARACT EXTRACTION     2011, other in 2013  . COLONOSCOPY  2011  . skin cancer removal      Prior to Admission medications   Medication Sig Start Date End Date Taking? Authorizing Provider  acetaminophen (TYLENOL) 650 MG CR tablet Take 650 mg by mouth at bedtime.    [provider]  aspirin 81 MG tablet Take 81 mg by mouth daily.    [provider]  fish oil-omega-3 fatty acids 1000 MG capsule Take 1 g by mouth daily.    [provider]  GLUCOSAMINE-CHONDROITIN-MSM PO Take by mouth.    [provider]  Multiple Vitamins-Minerals (MULTIVITAMIN WITH MINERALS) tablet Take 1 tablet by mouth daily.    [provider]  omeprazole (PRILOSEC) 20 MG capsule Take 1 capsule (20 mg total) by mouth 2 (two) times daily before a meal. 04/26/20   [name], MD  tadalafil (CIALIS) 20 MG tablet Take 0.5-1 tablets (10-20 mg total) by mouth every other day as needed for erectile dysfunction. 12/23/19   [name], MD  triamcinolone ointment (KENALOG) 0.1 % SMARTSIG:1 Application Topical 2-3 Times Daily Patient not taking: No sig reported 05/04/20   [provider]    Allergies as of 08/24/2020 - Review Complete 08/24/2020  Allergen Reaction Noted  . Sulfonamide derivatives  04/17/2007    Family History  Problem Relation Age of Onset  . Hypertension Mother   . Heart disease Neg Hx    . Diabetes Neg Hx   . Cancer Neg Hx     Social History   Socioeconomic History  . Marital status: Divorced    Spouse name: Not on file  . Number of children: 2  . Years of education: Not on file  . Highest education level: Not on file  Occupational History  . Occupation: retired--car dealer/sales  Tobacco Use  . Smoking status: Former Smoker    Packs/day: 2.00    Years: 50.00    Pack years: 100.00    Types: Pipe    Quit date: 10/20/2014    Years since quitting: 5.8  . Smokeless tobacco: Never Used  . Tobacco comment: pipe   Substance and Sexual Activity  . Alcohol use: No    Alcohol/week: 0.0 standard drinks  . Drug use: No  . Sexual activity: Not on file  Other Topics Concern  . Not on file  Social History Narrative   Has living will   Two sons and granddaughter are health care POAs (GD is first)   Would accept resuscitation and tube feedings if appropriate   Social Determinants of Health   Financial Resource Strain: Not on file  Food Insecurity: Not on file  Transportation Needs: Not on file  Physical Activity: Not on file  Stress: Not on file  Social Connections: Not on file  Intimate Partner Violence:  Not on file    Review of Systems: See HPI, otherwise negative ROS  Physical Exam: BP (!) 171/85   Pulse 68   Temp (!) 96.6 F (35.9 C) (Temporal)   Resp 17   Ht 5' 9.75" (1.772 m)   Wt 108.9 kg   SpO2 100%   BMI 34.68 kg/m  General:   Alert,  pleasant and cooperative in NAD Head:  Normocephalic and atraumatic. Neck:  Supple; no masses or thyromegaly. Lungs:  Clear throughout to auscultation, normal respiratory effort.    Heart:  +S1, +S2, Regular rate and rhythm, No edema. Abdomen:  Soft, nontender and nondistended. Normal bowel sounds, without guarding, and without rebound.   Neurologic:  Alert and  oriented x4;  grossly normal neurologically.  Impression/Plan: AKILI CORSETTI is here for an endoscopy  to be performed for  evaluation of  dysphagia    Risks, benefits, limitations, and alternatives regarding endoscopy have been reviewed with the patient.  Questions have been answered.  All parties agreeable.   Jonathon Bellows, MD  08/30/2020, 8:31 AM

## 2020-08-30 NOTE — Anesthesia Postprocedure Evaluation (Signed)
Anesthesia Post Note  Patient: Robert Vega  Procedure(s) Performed: ESOPHAGOGASTRODUODENOSCOPY (EGD) WITH PROPOFOL (N/A )  Patient location during evaluation: Endoscopy Anesthesia Type: General Level of consciousness: awake and alert and oriented Pain management: pain level controlled Vital Signs Assessment: post-procedure vital signs reviewed and stable Respiratory status: spontaneous breathing, nonlabored ventilation and respiratory function stable Cardiovascular status: blood pressure returned to baseline and stable Postop Assessment: no signs of nausea or vomiting Anesthetic complications: no   No complications documented.   Last Vitals:  Vitals:   08/30/20 0900 08/30/20 0910  BP: 134/81 (!) 150/89  Pulse: 64 63  Resp: 12 12  Temp:    SpO2: 97% 96%    Last Pain:  Vitals:   08/30/20 0910  TempSrc:   PainSc: 0-No pain                 Clairessa Boulet

## 2020-08-31 LAB — SURGICAL PATHOLOGY

## 2020-09-01 ENCOUNTER — Encounter: Payer: Self-pay | Admitting: Gastroenterology

## 2020-09-23 ENCOUNTER — Other Ambulatory Visit: Payer: Self-pay

## 2020-09-23 DIAGNOSIS — R1310 Dysphagia, unspecified: Secondary | ICD-10-CM

## 2020-09-23 NOTE — Progress Notes (Signed)
Per Dr. Vicente Males he would like for patient to have a esophageal manometry done. Documentation is located on GI report.

## 2020-09-29 ENCOUNTER — Other Ambulatory Visit: Payer: Self-pay

## 2020-09-29 DIAGNOSIS — R1310 Dysphagia, unspecified: Secondary | ICD-10-CM

## 2020-11-30 ENCOUNTER — Ambulatory Visit: Payer: Medicare Other | Admitting: Gastroenterology

## 2020-11-30 ENCOUNTER — Other Ambulatory Visit: Payer: Self-pay

## 2020-11-30 ENCOUNTER — Encounter: Payer: Self-pay | Admitting: Gastroenterology

## 2020-11-30 VITALS — BP 154/84 | HR 65 | Temp 98.5°F | Ht 69.75 in | Wt 242.1 lb

## 2020-11-30 DIAGNOSIS — K219 Gastro-esophageal reflux disease without esophagitis: Secondary | ICD-10-CM

## 2020-11-30 DIAGNOSIS — R1310 Dysphagia, unspecified: Secondary | ICD-10-CM

## 2020-11-30 NOTE — Progress Notes (Signed)
Jonathon Bellows MD, MRCP(U.K) [address]  [place]  [place], [place] [address]  Main: [phone]  Fax: [phone]   Primary Care Physician: [name], MD  Primary Gastroenterologist:  Dr. Jonathon Bellows   Chief Complaint  Patient presents with   Dysphagia    HPI: Robert Vega is a 80 y.o. male is here today to follow-up for GERD and dysphagia.  At his last visit he had some complaints of dysphagia and I performed an EGD subsequently and empirically dilated esophagus to 18 mm.  Biopsies esophagus showed reflux esophagitis.  There was decreased motility of the esophageal body noted.  He had been on PPI with good resolution of his symptoms but he states that he stopped it because the insert said that he could have black stools and he was concerned about the same.  He had been consuming a lot of artificial sugars and sweeteners leading to lot of bloating and belching which we stopped previously with good resolution of symptoms.  Since his last visit he states that the dysphagia has not significantly improved.  He does have a history suggestive of postnasal discharge.  Current Outpatient Medications  Medication Sig Dispense Refill   acetaminophen (TYLENOL) 650 MG CR tablet Take 650 mg by mouth at bedtime.     aspirin 81 MG tablet Take 81 mg by mouth daily.     fish oil-omega-3 fatty acids 1000 MG capsule Take 1 g by mouth daily.     GLUCOSAMINE-CHONDROITIN-MSM PO Take by mouth.     Multiple Vitamins-Minerals (MULTIVITAMIN WITH MINERALS) tablet Take 1 tablet by mouth daily.     tadalafil (CIALIS) 20 MG tablet Take 0.5-1 tablets (10-20 mg total) by mouth every other day as needed for erectile dysfunction. 5 tablet 11   omeprazole (PRILOSEC) 20 MG capsule Take 1 capsule (20 mg total) by mouth 2 (two) times daily before a meal. (Patient not taking: Reported on 11/30/2020) 180 capsule 3   triamcinolone ointment (KENALOG) 0.1 % SMARTSIG:1 Application Topical 2-3 Times Daily (Patient  not taking: No sig reported)     No current facility-administered medications for this visit.    Allergies as of 11/30/2020 - Review Complete 11/30/2020  Allergen Reaction Noted   Sulfonamide derivatives  04/17/2007    ROS:  General: Negative for anorexia, weight loss, fever, chills, fatigue, weakness. ENT: Negative for hoarseness, difficulty swallowing , nasal congestion. CV: Negative for chest pain, angina, palpitations, dyspnea on exertion, peripheral edema.  Respiratory: Negative for dyspnea at rest, dyspnea on exertion, cough, sputum, wheezing.  GI: See history of present illness. GU:  Negative for dysuria, hematuria, urinary incontinence, urinary frequency, nocturnal urination.  Endo: Negative for unusual weight change.    Physical Examination:   BP (!) 154/84 (BP Location: Left Arm, Patient Position: Sitting, Cuff Size: Normal)   Pulse 65   Temp 98.5 F (36.9 C) (Oral)   Ht 5' 9.75" (1.772 m)   Wt 242 lb 2 oz (109.8 kg)   BMI 34.99 kg/m   General: Well-nourished, well-developed in no acute distress.  Eyes: No icterus. Conjunctivae pink. Neuro: Alert and oriented x 3.  Grossly intact. Skin: Warm and dry, no jaundice.   Psych: Alert and cooperative, normal mood and affect.   Imaging Studies: No results found.  Assessment and Plan:   Robert Vega is a 80 y.o. y/o male here to follow-up for dysphagia.  Status post EGD and empiric dilation of the esophagus to 18 mm and significant  esophageal dysmotility noted.  I explained to him that we could consider esophageal manometry to determine the etiology for his esophageal dysmotility.  He is not keen at this point of time he states that the dysphagia is very occasional.  He does have a history of postnasal discharge which I suggested may be related to his sinuses and explained I could refer him to ENT for the same he was not keen on it presently.  He had been on a PPI previously but has stopped it he definitely has acid  reflux as he was found to have reflux esophagitis on his biopsies.  He was concerned about the concerns of a GI bleed from the PPI which I explained to him is very unlikely as we use PPIs to treat GI bleeds.  He still seems reluctant to resume use of an omeprazole but was open to trying Pepcid twice a day.  I explained to him that if the symptoms got worse or he would desire further evaluation to call my office to set up a follow-up appointment.    Dr Jonathon Bellows  MD,MRCP [facility]) Follow up in as needed

## 2021-03-10 DIAGNOSIS — Z23 Encounter for immunization: Secondary | ICD-10-CM | POA: Diagnosis not present

## 2021-03-23 DIAGNOSIS — H353131 Nonexudative age-related macular degeneration, bilateral, early dry stage: Secondary | ICD-10-CM | POA: Diagnosis not present

## 2021-05-02 ENCOUNTER — Encounter: Payer: Self-pay | Admitting: Internal Medicine

## 2021-05-02 ENCOUNTER — Other Ambulatory Visit: Payer: Self-pay

## 2021-05-02 ENCOUNTER — Ambulatory Visit (INDEPENDENT_AMBULATORY_CARE_PROVIDER_SITE_OTHER): Payer: Medicare Other | Admitting: Internal Medicine

## 2021-05-02 VITALS — BP 140/88 | HR 70 | Temp 98.3°F | Ht 69.5 in | Wt 247.0 lb

## 2021-05-02 DIAGNOSIS — Z0000 Encounter for general adult medical examination without abnormal findings: Secondary | ICD-10-CM | POA: Diagnosis not present

## 2021-05-02 DIAGNOSIS — F39 Unspecified mood [affective] disorder: Secondary | ICD-10-CM

## 2021-05-02 DIAGNOSIS — M159 Polyosteoarthritis, unspecified: Secondary | ICD-10-CM

## 2021-05-02 DIAGNOSIS — K2100 Gastro-esophageal reflux disease with esophagitis, without bleeding: Secondary | ICD-10-CM

## 2021-05-02 DIAGNOSIS — N401 Enlarged prostate with lower urinary tract symptoms: Secondary | ICD-10-CM

## 2021-05-02 LAB — COMPREHENSIVE METABOLIC PANEL
ALT: 16 U/L (ref 0–53)
AST: 23 U/L (ref 0–37)
Albumin: 4.2 g/dL (ref 3.5–5.2)
Alkaline Phosphatase: 70 U/L (ref 39–117)
BUN: 18 mg/dL (ref 6–23)
CO2: 29 mEq/L (ref 19–32)
Calcium: 8.8 mg/dL (ref 8.4–10.5)
Chloride: 101 mEq/L (ref 96–112)
Creatinine, Ser: 1 mg/dL (ref 0.40–1.50)
GFR: 70.26 mL/min (ref >60.00)
Glucose, Bld: 99 mg/dL (ref 70–99)
Potassium: 4 mEq/L (ref 3.5–5.1)
Sodium: 136 mEq/L (ref 135–145)
Total Bilirubin: 0.4 mg/dL (ref 0.2–1.2)
Total Protein: 7.3 g/dL (ref 6.0–8.3)

## 2021-05-02 LAB — CBC
HCT: 38.3 % — ABNORMAL LOW (ref 39.0–52.0)
Hemoglobin: 12.6 g/dL — ABNORMAL LOW (ref 13.0–17.0)
MCHC: 33 g/dL (ref 30.0–36.0)
MCV: 98.5 fl (ref 78.0–100.0)
Platelets: 247 10*3/uL (ref 150.0–400.0)
RBC: 3.89 Mil/uL — ABNORMAL LOW (ref 4.22–5.81)
RDW: 13.3 % (ref 11.5–15.5)
WBC: 6.7 10*3/uL (ref 4.0–10.5)

## 2021-05-02 NOTE — Assessment & Plan Note (Signed)
Mild symptoms Would consider tamsulosin if worsens 

## 2021-05-02 NOTE — Assessment & Plan Note (Signed)
BMI now almost 36---with arthritis, GERD and flexibility issues Will give DASH info Needs more exercise

## 2021-05-02 NOTE — Progress Notes (Signed)
Hearing Screening - Comments:: Passed whisper test Vision Screening - Comments:: October 2022  

## 2021-05-02 NOTE — Patient Instructions (Signed)

## 2021-05-02 NOTE — Assessment & Plan Note (Signed)
I have personally reviewed the Medicare Annual Wellness questionnaire and have noted 1. The patient's medical and social history 2. Their use of alcohol, tobacco or illicit drugs 3. Their current medications and supplements 4. The patient's functional ability including ADL's, fall risks, home safety risks and hearing or visual             impairment. 5. Diet and physical activities 6. Evidence for depression or mood disorders  The patients weight, height, BMI and visual acuity have been recorded in the chart I have made referrals, counseling and provided education to the patient based review of the above and I have provided the pt with a written personalized care plan for preventive services.  I have provided you with a copy of your personalized plan for preventive services. Please take the time to review along with your updated medication list.  Done with cancer screening Really has to work on fitness and muscle strengthening Td and shingrix when covered Had bivalent COVID and flu vaccines

## 2021-05-02 NOTE — Assessment & Plan Note (Signed)
Doing better since in relationship--no Rx indicated (some dysthymia in past)

## 2021-05-02 NOTE — Assessment & Plan Note (Signed)
EGD benign Continues with pepcid and gaviscon

## 2021-05-02 NOTE — Assessment & Plan Note (Signed)
Seems to be worse Discussed muscle strength Uses tylenol and MSM

## 2021-05-02 NOTE — Progress Notes (Signed)
Subjective:    Patient ID: Robert Vega, male    DOB: 04-29-1941, 80 y.o.   MRN: [id number]  HPI Here for Medicare wellness visit and follow up of chronic health conditions This visit occurred during the SARS-CoV-2 public health emergency.  Safety protocols were in place, including screening questions prior to the visit, additional usage of staff PPE, and extensive cleaning of exam room while observing appropriate contact time as indicated for disinfecting solutions.   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 Also on tylenol  Didn't tolerate the omeprazole Now on pepcid per Dr Vicente Males Had EGD---did dilate distal esophagus Still has some dysphagia and heartburn (uses gaviscon prn) Trying not to eat late, etc  Still in relationship with "lady friend" They maintain separate residences ongoing Depression is still better with this Ongoing recall issues---no functional problems  Ongoing voiding issues Has to sit Nocturia x 1 usually  Current Outpatient Medications on File Prior to Visit  Medication Sig Dispense Refill   acetaminophen (TYLENOL) 650 MG CR tablet Take 650 mg by mouth at bedtime.     aspirin 81 MG tablet Take 81 mg by mouth daily.      Dextromethorphan-guaiFENesin 10-100 MG/5ML liquid Take 5 mLs by mouth every 12 (twelve) hours.     famotidine (PEPCID) 20 MG tablet Take 20 mg by mouth 2 (two) times daily.     fish oil-omega-3 fatty acids 1000 MG capsule Take 1 g by mouth daily.     GLUCOSAMINE-CHONDROITIN-MSM PO Take by mouth.     Multiple Vitamins-Minerals (MULTIVITAMIN WITH MINERALS) tablet Take 1 tablet by mouth daily.     tadalafil (CIALIS) 20 MG tablet Take 0.5-1 tablets (10-20 mg total) by mouth every other day as needed for erectile dysfunction. 5 tablet 11   triamcinolone ointment (KENALOG) 0.1 % SMARTSIG:1 Application Topical 2-3 Times Daily     No current facility-administered medications on file prior to visit.    Allergies  Allergen Reactions   Sulfonamide Derivatives     Past Medical History:  Diagnosis Date   Allergy    Arthritis    BPH (benign prostatic hypertrophy)    Colonic polyp    GERD (gastroesophageal reflux disease)    Has LPR    Past Surgical History:  Procedure Laterality Date   CATARACT EXTRACTION     2011, other in 2013   COLONOSCOPY  2011   ESOPHAGOGASTRODUODENOSCOPY (EGD) WITH PROPOFOL N/A 08/30/2020   Procedure: ESOPHAGOGASTRODUODENOSCOPY (EGD) WITH PROPOFOL;  Surgeon: Jonathon Bellows, MD;  Location: [facility] ENDOSCOPY;  Service: Gastroenterology;  Laterality: N/A;   skin cancer removal      Family History  Problem Relation Age of Onset   Hypertension Mother    Heart disease Neg Hx    Diabetes Neg Hx    Cancer Neg Hx     Social History   Socioeconomic History  Marital status: Divorced    Spouse name: Not on file   Number of children: 2   Years of education: Not on file   Highest education level: Not on file  Occupational History   Occupation: retired--car dealer/sales  Tobacco Use   Smoking status: Former    Packs/day: 2.00    Years: 50.00    Pack years: 100.00    Types: Pipe, Cigarettes    Quit date: 10/20/2014    Years since quitting: 6.5   Smokeless tobacco: Never    Tobacco comments:    pipe   Substance and Sexual Activity   Alcohol use: No    Alcohol/week: 0.0 standard drinks   Drug use: No   Sexual activity: Not on file  Other Topics Concern   Not on file  Social History Narrative   Has living will   Two sons and granddaughter are health care POAs (GD is first)   Would accept resuscitation and tube feedings if appropriate   Social Determinants of Health   Financial Resource Strain: Not on file  Food Insecurity: Not on file  Transportation Needs: Not on file  Physical Activity: Not on file  Stress: Not on file  Social Connections: Not on file  Intimate Partner Violence: Not on file   Review of Systems Sleeps okay--some cough at night though (uses cough med)--discussed raising HOB Appetite is good Weight is stable over 1 year--but up 20# over several years Wears seat belt Has 1 bad tooth---even after root canal Has derm appt in a few days Bowels move fine--no blood (but occasionally black) No chest pain  Chronic "hard breathing" ---relates to deviated septum No dizziness or syncope    Objective:   Physical Exam Constitutional:      Appearance: Normal appearance.  HENT:     Mouth/Throat:     Comments: No lesions Eyes:     Conjunctiva/sclera: Conjunctivae normal.     Pupils: Pupils are equal, round, and reactive to light.  Cardiovascular:     Rate and Rhythm: Normal rate and regular rhythm.     Pulses: Normal pulses.     Heart sounds: No murmur heard.   No gallop.  Pulmonary:     Effort: Pulmonary effort is normal.     Breath sounds: Normal breath sounds. No wheezing or rales.  Abdominal:     Palpations: Abdomen is soft.     Tenderness: There is no abdominal tenderness.  Musculoskeletal:     Right lower leg: No edema.     Left lower leg: No edema.  Skin:    Findings: No lesion or rash.  Neurological:     Mental Status: He is alert and oriented to person, place, and time.     Comments: President---"Joe ?, Daisy Floro, black guy" [id number] Recall 3/3  Psychiatric:        Mood and Affect: Mood normal.        Behavior: Behavior normal.           Assessment & Plan:

## 2021-06-19 ENCOUNTER — Encounter: Payer: Self-pay | Admitting: Nurse Practitioner

## 2021-06-19 ENCOUNTER — Telehealth (INDEPENDENT_AMBULATORY_CARE_PROVIDER_SITE_OTHER): Payer: Medicare Other | Admitting: Nurse Practitioner

## 2021-06-19 ENCOUNTER — Other Ambulatory Visit: Payer: Self-pay

## 2021-06-19 DIAGNOSIS — Z20822 Contact with and (suspected) exposure to covid-19: Secondary | ICD-10-CM | POA: Diagnosis not present

## 2021-06-19 LAB — POC COVID19 BINAXNOW: SARS Coronavirus 2 Ag: NEGATIVE

## 2021-06-19 NOTE — Progress Notes (Signed)
Patient ID: Robert Vega, male    DOB: 1940-07-29, 81 y.o.   MRN: [id number]  Virtual visit completed through [place], a video enabled telemedicine application. Due to national recommendations of social distancing due to COVID-19, a virtual visit is felt to be most appropriate for this patient at this time. Reviewed limitations, risks, security and privacy concerns of performing a virtual visit and the availability of in person appointments. I also reviewed that there may be a patient responsible charge related to this service. The patient agreed to proceed.   Patient location: home Provider location: [place] [facility], office Persons participating in this virtual visit: patient, provider   If any vitals were documented, they were collected by patient at home unless specified below.    There were no vitals taken for this visit.   CC: Covid 19 exposure Subjective:   HPI: Robert Vega is a 81 y.o. male presenting on 06/19/2021 for Covid Exposure (On 06/17/21, found out yesterday that person has Covid-was very very close in distance to that person. No symptoms. )   Was exposed on Saturday (06/17/2021) this weekend The person who he was exposed to tested positive yesterday (06/18/2021) Pfizer x2 and one booster No symptoms No at home Covid test. States that with his age he is not the best with doing the different steps   Relevant past medical, surgical, family and social history reviewed and updated as indicated. Interim medical history since our last visit reviewed. Allergies and medications reviewed and updated. Outpatient Medications Prior to Visit  Medication Sig Dispense Refill   acetaminophen (TYLENOL) 650 MG CR tablet Take 650 mg by mouth at bedtime.     aspirin 81 MG tablet Take 81 mg by mouth daily.     famotidine (PEPCID) 20 MG tablet Take 20 mg by mouth 2 (two) times daily.     GLUCOSAMINE-CHONDROITIN-MSM PO Take by mouth.     Multiple Vitamins-Minerals  (MULTIVITAMIN WITH MINERALS) tablet Take 1 tablet by mouth daily.     tadalafil (CIALIS) 20 MG tablet Take 0.5-1 tablets (10-20 mg total) by mouth every other day as needed for erectile dysfunction. 5 tablet 11   triamcinolone ointment (KENALOG) 0.1 % SMARTSIG:1 Application Topical 2-3 Times Daily     Dextromethorphan-guaiFENesin 10-100 MG/5ML liquid Take 5 mLs by mouth every 12 (twelve) hours.     fish oil-omega-3 fatty acids 1000 MG capsule Take 1 g by mouth daily.     No facility-administered medications prior to visit.     Per HPI unless specifically indicated in ROS section below Review of Systems  Constitutional:  Positive for fatigue. Negative for chills and fever.  HENT:  Negative for congestion, ear pain, sinus pressure and sore throat.   Respiratory:  Positive for shortness of breath (baseline). Negative for cough.   Cardiovascular:  Negative for chest pain.  Gastrointestinal:  Negative for abdominal pain, diarrhea, nausea and vomiting.  Neurological:  Negative for headaches.  Objective:  There were no vitals taken for this visit.  Wt Readings from Last 3 Encounters:  05/02/21 247 lb (112 kg)  11/30/20 242 lb 2 oz (109.8 kg)  08/30/20 240 lb (108.9 kg)       Physical exam: Gen: alert, NAD, not ill appearing Pulm: speaks in complete sentences without increased work of breathing Psych: normal mood, normal thought content      Results for orders placed or performed in visit on 05/02/21  Comprehensive metabolic panel  Result Value Ref Range  Sodium 136 135 - 145 mEq/L   Potassium 4.0 3.5 - 5.1 mEq/L   Chloride 101 96 - 112 mEq/L   CO2 29 19 - 32 mEq/L   Glucose, Bld 99 70 - 99 mg/dL   BUN 18 6 - 23 mg/dL   Creatinine, Ser 1.00 0.40 - 1.50 mg/dL   Total Bilirubin 0.4 0.2 - 1.2 mg/dL   Alkaline Phosphatase 70 39 - 117 U/L   AST 23 0 - 37 U/L   ALT 16 0 - 53 U/L   Total Protein 7.3 6.0 - 8.3 g/dL   Albumin 4.2 3.5 - 5.2 g/dL   GFR 70.26 >60.00 mL/min   Calcium 8.8  8.4 - 10.5 mg/dL  CBC  Result Value Ref Range   WBC 6.7 4.0 - 10.5 K/uL   RBC 3.89 (L) 4.22 - 5.81 Mil/uL   Platelets 247.0 150.0 - 400.0 K/uL   Hemoglobin 12.6 (L) 13.0 - 17.0 g/dL   HCT 38.3 (L) 39.0 - 52.0 %   MCV 98.5 78.0 - 100.0 fl   MCHC 33.0 30.0 - 36.0 g/dL   RDW 13.3 11.5 - 15.5 %   Assessment & Plan:   Problem List Items Addressed This Visit       Other   Exposure to COVID-19 virus - Primary    Patient is concerned because he was exposed to COVID-19.  Patient would like to be tested and will oblige.  Pending rapid COVID test in office.      Relevant Orders   POC COVID-19 (Completed)     No orders of the defined types were placed in this encounter.  No orders of the defined types were placed in this encounter.   I discussed the assessment and treatment plan with the patient. The patient was provided an opportunity to ask questions and all were answered. The patient agreed with the plan and demonstrated an understanding of the instructions. The patient was advised to call back or seek an in-person evaluation if the symptoms worsen or if the condition fails to improve as anticipated.  Follow up plan: No follow-ups on file.  Robert Garret, NP

## 2021-06-19 NOTE — Assessment & Plan Note (Signed)
Patient is concerned because he was exposed to COVID-19.  Patient would like to be tested and will oblige.  Pending rapid COVID test in office.

## 2021-08-16 ENCOUNTER — Other Ambulatory Visit: Payer: Self-pay

## 2021-08-16 ENCOUNTER — Emergency Department: Payer: Medicare Other

## 2021-08-16 ENCOUNTER — Encounter: Payer: Self-pay | Admitting: Emergency Medicine

## 2021-08-16 ENCOUNTER — Telehealth: Payer: Self-pay

## 2021-08-16 ENCOUNTER — Emergency Department
Admission: EM | Admit: 2021-08-16 | Discharge: 2021-08-16 | Disposition: A | Discharge status: Home / Self Care | Payer: Medicare Other | Attending: Student in an Organized Health Care Education/Training Program | Admitting: Student in an Organized Health Care Education/Training Program

## 2021-08-16 DIAGNOSIS — R42 Dizziness and giddiness: Secondary | ICD-10-CM | POA: Insufficient documentation

## 2021-08-16 DIAGNOSIS — I10 Essential (primary) hypertension: Secondary | ICD-10-CM | POA: Diagnosis not present

## 2021-08-16 DIAGNOSIS — I619 Nontraumatic intracerebral hemorrhage, unspecified: Secondary | ICD-10-CM | POA: Diagnosis not present

## 2021-08-16 DIAGNOSIS — I517 Cardiomegaly: Secondary | ICD-10-CM | POA: Diagnosis not present

## 2021-08-16 DIAGNOSIS — R079 Chest pain, unspecified: Secondary | ICD-10-CM | POA: Diagnosis not present

## 2021-08-16 DIAGNOSIS — Z8673 Personal history of transient ischemic attack (TIA), and cerebral infarction without residual deficits: Secondary | ICD-10-CM | POA: Diagnosis not present

## 2021-08-16 DIAGNOSIS — R030 Elevated blood-pressure reading, without diagnosis of hypertension: Secondary | ICD-10-CM | POA: Diagnosis present

## 2021-08-16 LAB — BASIC METABOLIC PANEL
Anion gap: 10 (ref 5–15)
BUN: 24 mg/dL — ABNORMAL HIGH (ref 8–23)
CO2: 24 mmol/L (ref 22–32)
Calcium: 9 mg/dL (ref 8.9–10.3)
Chloride: 100 mmol/L (ref 98–111)
Creatinine, Ser: 1.05 mg/dL (ref 0.61–1.24)
GFR, Estimated: >60 mL/min (ref >60)
Glucose, Bld: 104 mg/dL — ABNORMAL HIGH (ref 70–99)
Potassium: 3.8 mmol/L (ref 3.5–5.1)
Sodium: 134 mmol/L — ABNORMAL LOW (ref 135–145)

## 2021-08-16 LAB — CBC
HCT: 39 % (ref 39.0–52.0)
Hemoglobin: 13.2 g/dL (ref 13.0–17.0)
MCH: 32.4 pg (ref 26.0–34.0)
MCHC: 33.8 g/dL (ref 30.0–36.0)
MCV: 95.6 fL (ref 80.0–100.0)
Platelets: 276 10*3/uL (ref 150–400)
RBC: 4.08 MIL/uL — ABNORMAL LOW (ref 4.22–5.81)
RDW: 12.8 % (ref 11.5–15.5)
WBC: 6.8 10*3/uL (ref 4.0–10.5)
nRBC: 0 % (ref 0.0–0.2)

## 2021-08-16 LAB — TROPONIN I (HIGH SENSITIVITY): Troponin I (High Sensitivity): 9 ng/L (ref <18)

## 2021-08-16 MED ORDER — AMLODIPINE BESYLATE 2.5 MG PO TABS: 2.5000 mg | ORAL_TABLET | Freq: Every day | ORAL | 1 refills | Status: DC

## 2021-08-16 MED ORDER — AMLODIPINE BESYLATE 5 MG PO TABS
2.5000 mg | ORAL_TABLET | Freq: Once | ORAL | Status: AC
  Administered 2021-08-16: 2.5 mg via ORAL
  Filled 2021-08-16: qty 1

## 2021-08-16 NOTE — ED Provider Notes (Signed)
? ?[facility] ?Provider Note ? ? ? Event Date/Time  ? First MD Initiated Contact with Patient 08/16/21 1812   ?  (approximate) ? ? ?History  ? ?Hypertension ? ? ?HPI ? ?Robert Vega is a 81 y.o. male no history of high blood pressure antihypertensive medications presents to the ER for evaluation of concern for elevated blood pressure as well as episode of dizziness that occurred today.  Patient felt the room was spinning sitting down.  Happened abruptly.  It was not worsened with movement.  Last for quite some time.  Did not get better with rest.  Also felt like his face was flushed.  So he checked his blood pressure and it was elevated with systolics in the 295A.  Denies any chest pain or shortness of breath.  Does not feel dizzy right now.  Denies any numbness or tingling no nausea or vomiting. ?  ? ? ?Physical Exam  ? ?Triage Vital Signs: ?ED Triage Vitals  ?Enc Vitals Group  ?   BP 08/16/21 1713 (!) 142/105  ?   Pulse Rate 08/16/21 1713 83  ?   Resp 08/16/21 1713 20  ?   Temp 08/16/21 1713 98.7 ?F (37.1 ?C)  ?   Temp Source 08/16/21 1713 Oral  ?   SpO2 08/16/21 1713 96 %  ?   Weight 08/16/21 1714 240 lb (108.9 kg)  ?   Height 08/16/21 1714 '5\' 9"'$  (1.753 m)  ?   Head Circumference --   ?   Peak Flow --   ?   Pain Score 08/16/21 1713 0  ?   Pain Loc --   ?   Pain Edu? --   ?   Excl. in [place]? --   ? ? ?Most recent vital signs: ?Vitals:  ? 08/16/21 1713  ?BP: (!) 142/105  ?Pulse: 83  ?Resp: 20  ?Temp: 98.7 ?F (37.1 ?C)  ?SpO2: 96%  ? ? ? ?Constitutional: Alert  ?Eyes: Conjunctivae are normal.  ?Head: Atraumatic. ?Nose: No congestion/rhinnorhea. ?Mouth/Throat: Mucous membranes are moist.   ?Neck: Painless ROM.  ?Cardiovascular:   Good peripheral circulation. ?Respiratory: Normal respiratory effort.  No retractions.  ?Gastrointestinal: Soft and nontender.  ?Musculoskeletal:  no deformity ?Neurologic: CN- intact.  No facial droop, Normal FNF.  Normal heel to shin.  Sensation intact bilaterally.  Normal speech and language. No gross focal neurologic deficits are appreciated. No gait instability. ? ?Skin:  Skin is warm, dry and intact. No rash noted. ?Psychiatric: Mood and affect are normal. Speech and behavior are normal. ? ? ? ?ED Results / Procedures / Treatments  ? ?Labs ?(all labs ordered are listed, but only abnormal results are displayed) ?Labs Reviewed  ?BASIC METABOLIC PANEL - Abnormal; Notable for the following components:  ?    Result Value  ? Sodium 134 (*)   ? Glucose, Bld 104 (*)   ? BUN 24 (*)   ? All other components within normal limits  ?CBC - Abnormal; Notable for the following components:  ? RBC 4.08 (*)   ? All other components within normal limits  ?TROPONIN I (HIGH SENSITIVITY)  ?TROPONIN I (HIGH SENSITIVITY)  ? ? ? ?EKG ? ?ED ECG REPORT ?I, Merlyn Lot, the attending physician, personally viewed and interpreted this ECG. ? ? Date: 08/16/2021 ? EKG Time: 17:20 ? Rate: 80 ? Rhythm: sinus ? Axis: normal ? Intervals: normal ? ST&T Change: no stemi, no depression ? ? ? ?RADIOLOGY ?Please see ED Course for my  review and interpretation. ? ?I personally reviewed all radiographic images ordered to evaluate for the above acute complaints and reviewed radiology reports and findings.  These findings were personally discussed with the patient.  Please see medical record for radiology report. ? ? ? ?PROCEDURES: ? ?Critical Care performed:  ? ?Procedures ? ? ?MEDICATIONS ORDERED IN ED: ?Medications  ?amLODipine (NORVASC) tablet 2.5 mg (has no administration in time range)  ? ? ? ?IMPRESSION / MDM / ASSESSMENT AND PLAN / ED COURSE  ?I reviewed the triage vital signs and the nursing notes. ?             ?               ? ?Differential diagnosis includes, but is not limited to, essential hypertension, hypertensive urgency, CVA, vertigo, dysrhythmia, ACS, CHF ? ?Patient presented to ER with concern for elevated blood pressure systolic in the 591M at home associated with dizziness.  No focal  neurodeficits at this time.  Possible TIA no headache.  Doubt bleed.  EKG nonischemic troponin negative blood pressure still mildly elevated no findings to suggest acute CHF.  Based on age and risk factors have recommended MRI to further evaluate. ? ? ?Clinical Course as of 08/16/21 1954  ?Wed Aug 16, 2021  ?1927 MRI my review and interpretation does not show evidence of mass or hemorrhage will await formal radiology report. [PR]  ?1951 MRI without evidence of acute abnormality.  Patient has follow-up with PCP.  Will be put on low-dose Norvasc further antihypertensive medication regimen can be determined by discussion with his primary physician.  Patient feels well denies any discomfort does appear stable and appropriate for outpatient follow-up. [PR]  ?  ?Clinical Course User Index ?[PR] Merlyn Lot, MD  ? ? ? ?FINAL CLINICAL IMPRESSION(S) / ED DIAGNOSES  ? ?Final diagnoses:  ?Hypertension, unspecified type  ? ? ? ?Rx / DC Orders  ? ?ED Discharge Orders   ? ?      Ordered  ?  amLODipine (NORVASC) 2.5 MG tablet  Daily,   Status:  Discontinued       ? 08/16/21 1953  ?  amLODipine (NORVASC) 2.5 MG tablet  Daily       ? 08/16/21 1954  ? ?  ?  ? ?  ? ? ? ?Note:  This document was prepared using Dragon voice recognition software and may include unintentional dictation errors. ? ?  ?Merlyn Lot, MD ?08/16/21 1954 ? ?

## 2021-08-16 NOTE — ED Notes (Signed)
Pt to MRI

## 2021-08-16 NOTE — Telephone Encounter (Signed)
I spoke with pt; pt said he takes no BP med and no prescription medications; pt said once before his BP went up very high about 1 1/2 months ago pt got upset and BP greater than 540 for systolic. Pt said usually his BP 145/70. Pt has a wrist cuff at home; pt could not find the paper with BP readings on it. Pt took BP at 3:30 213/123 P 130;  advised pt to relax and sit quietly for few mins; pt did and BP reck 199/101 P 122. Pt said no CP,H/A, or SOB but pt is dizzy, has had blurred vision earlier today and pts face feels and looks flushed. Robert Garret NP said pt needs to be seen for eval today.Adv ised pt he needs to be seen for eval. Pt said he cannot drive and has no one to take him. I offered EMS but pt said no. Pt said I could call his girlfriend Robert Vega at [phone]. I spoke with Mrs Robert Vega and she said she agrees pt needs to go to hospital and asked if I could get pt on phone so she could talk to pt; I conferenced call and they talked and pt finally agreed to go to ED for eval. Pt said he will go to [facility] ED and Mrs Robert Vega is going to pick pt up now to go to ED. Pt still wants appt left with Robert Garret NP for 08/17/21 at 10 AM incase ED wants pt seen that quickly and [facility] said OK and if pt does not need appt that soon as pt can call early AM and he will not charge the pt for cancelled appt. Pt appreciative and going to send note to Dr Silvio Pate, Robert Garret NP and [facility] CMA. I offered appt with Dr Silvio Pate tomorrow but pt had another appt at that time he could not cancel.  ?

## 2021-08-16 NOTE — Telephone Encounter (Signed)
Robert Vega - Client ?TELEPHONE ADVICE RECORD ?Access[facility]urse? ?Patient ?Name: ?Robert GAR ?Vega ?Gender: Male ?DOB: 12-Apr-1941 ?Age: 81 Y 5 M 15 D ?Return ?Phone ?Number: ?[phone] ?(Primary) ?Address: ?City/ ?State/ ?Zip: ?[place] ? [address] ?Client [place] [facility] - Client ?Client Site [facility] ?Provider Viviana Simpler- MD ?Contact Type Call ?Who Is Calling Patient / Member / Family / Caregiver ?Call Type Triage / Clinical ?Relationship To Patient Self ?Return Phone Number [phone] (Primary) ?Chief Complaint Dizziness ?Reason for Call Symptomatic / Request for Health Information ?Initial Comment Caller states his blood pressure high and he is ?feeling dizzy. ?Translation No ?Nurse Assessment ?Nurse: Zorita Pang, RN, Deborah Date/Time (Eastern Time): 08/16/2021 2:35:42 PM ?Confirm and document reason for call. If ?symptomatic, describe symptoms. ?---The caller states that his BP has been up - highest ?was 187/99 at 10:30 am. He is not taking BP ?medications. Other BP 130/96, 161/93. He states that ?when he got up out of his chair he was dizzy. Did get ?up suddenly. No pain. ?Does the patient have any new or worsening ?symptoms? ---Yes ?Will a triage be completed? ---Yes ?Related visit to physician within the last 2 weeks? ---No ?Does the PT have any chronic conditions? (i.e. ?diabetes, asthma, this includes High risk factors for ?pregnancy, etc.) ?---Yes ?List chronic conditions. ?---ASA 81 mg, men's supplement, glycosamine, ?Tylenol 650 mg for arthritis in his back. Pepcid for ?esophageal ?Is this a behavioral health or substance abuse call? ---No ?Guidelines ?Guideline Title Affirmed Question Affirmed Notes Nurse Date/Time (Eastern ?Time) ?Blood Pressure - ?High ?Systolic BP >= 031 ?OR Diastolic >= 594 ?Womble, RN, ?Neoma Laming ?08/16/2021 2:40:23 ?PM ?Disp. Time (Eastern ?Time) Disposition Final User ?08/16/2021 2:50:32 PM See PCP within  24 Hours Yes Womble, RN, Neoma Laming ?PLEASE NOTE: All timestamps contained within this report are represented as [place] Standard Time. ?CONFIDENTIALTY NOTICE: This fax transmission is intended only for the addressee. It contains information that is legally privileged, confidential or ?otherwise protected from use or disclosure. If you are not the intended recipient, you are strictly prohibited from reviewing, disclosing, copying using ?or disseminating any of this information or taking any action in reliance on or regarding this information. If you have received this fax in error, please ?notify us immediately by telephone so that we can arrange for its return to [place]. Phone: [phone], Toll-Free: [phone], Fax: [phone] ?Page: 2 of 2 ?Call Id: [id number] ?Caller Disagree/Comply Comply ?Caller Understands Yes ?PreDisposition Call Doctor ?Care Advice Given Per Guideline ?* The goal of blood pressure treatment for most people with hypertension is to keep the blood pressure under 140/90. For people that ?are 15 years or older, your doctor may instead want to keep the blood pressure under 150/90. * You become worse ?Comments ?User: Marquis Buggy, RN Date/Time Eilene Ghazi Time): 08/16/2021 2:54:06 PM ?This nurse discussed dizziness with the caller and he states that he mainly gets dizzy if he gets up too fast or if he ?is lying back in this recliner he can see the ceiling turning. This nurse did explain that he should not be making ?any quick movements as aging our body doesn't acclimate to position changes like it did when we were younger. ?He states that he forgets about that. This nurse called the backline and got the caller an appointment tomorrow ?morning at 10 with Robert Vega and was instructed to call back if he worsens and he verbalized understanding. ?Referrals ?REFERRED TO PCP OFFIC ?

## 2021-08-16 NOTE — Telephone Encounter (Signed)
Agree with disposition. 

## 2021-08-16 NOTE — ED Notes (Signed)
Patient declined discharge vital signs. 

## 2021-08-16 NOTE — ED Triage Notes (Signed)
Pt to ED via POV with c/o Hypertension. He got dizzy about 1030 and took his B/P . He noticed that his B/P is elevated when his cheeks turn red. Pt does not take any B/P medication.  ?

## 2021-08-17 ENCOUNTER — Ambulatory Visit: Payer: Medicare Other | Admitting: Nurse Practitioner

## 2021-08-17 NOTE — Telephone Encounter (Signed)
Called and lvm for patient to call us back. 

## 2021-08-17 NOTE — Telephone Encounter (Signed)
Pt called to cancel appt today. Said ER gave him a amlodipine for his BP. Wanted him to f/u with Dr Silvio Pate. I made him an appt with Dr Silvio Pate. ?

## 2021-08-17 NOTE — Telephone Encounter (Signed)
Pt states that he was told at discharge that he needed an appointment 30 days after ? ?Picked up his medicine and states his BP was 128/65 today and his MRI was fine. ? ?Patient understands he is scheduled for 4.19.23 ?

## 2021-08-18 NOTE — Telephone Encounter (Signed)
Spoke to pt to advise him if his BP stays over 140/90, to call the office and move up that appt.  ?

## 2021-09-13 ENCOUNTER — Ambulatory Visit (INDEPENDENT_AMBULATORY_CARE_PROVIDER_SITE_OTHER): Payer: Medicare Other | Admitting: Internal Medicine

## 2021-09-13 ENCOUNTER — Encounter: Payer: Self-pay | Admitting: Internal Medicine

## 2021-09-13 DIAGNOSIS — K2100 Gastro-esophageal reflux disease with esophagitis, without bleeding: Secondary | ICD-10-CM

## 2021-09-13 DIAGNOSIS — I10 Essential (primary) hypertension: Secondary | ICD-10-CM | POA: Diagnosis not present

## 2021-09-13 MED ORDER — TADALAFIL 20 MG PO TABS: 10.0000 mg | ORAL_TABLET | ORAL | 11 refills | Status: DC | PRN

## 2021-09-13 MED ORDER — AMLODIPINE BESYLATE 2.5 MG PO TABS: 2.5000 mg | ORAL_TABLET | Freq: Every day | ORAL | 3 refills | Status: DC

## 2021-09-13 NOTE — Assessment & Plan Note (Addendum)
Ongoing dysphagia ?Knows he has to stop eating late, eating snacks,etc ?Discussed fitness ?Continues on pepcid ?

## 2021-09-13 NOTE — Progress Notes (Signed)
? ?Subjective:  ? ? Patient ID: Robert Vega, male    DOB: Aug 02, 1940, 81 y.o.   MRN: [id number] ? ?HPI ?Here for ER follow up due to HTN ? ?He could tell his BP was high---redness in face ?Wrist cuff as high as 195 systolic ?No headaches or focal neurologic symptoms ?Went to ER---evaluation reassuring and brain MRI fine ? ?Started on amlodipine 2.'5mg'$  ?Has been checking at home---not higher than 140/70's ?Feels fine ? ?Current Outpatient Medications on File Prior to Visit  ?Medication Sig Dispense Refill  ? acetaminophen (TYLENOL) 650 MG CR tablet Take 650 mg by mouth at bedtime.    ? amLODipine (NORVASC) 2.5 MG tablet Take 1 tablet (2.5 mg total) by mouth daily. 30 tablet 1  ? aspirin 81 MG tablet Take 81 mg by mouth daily.    ? famotidine (PEPCID) 20 MG tablet Take 20 mg by mouth 2 (two) times daily.    ? GLUCOSAMINE-CHONDROITIN-MSM PO Take by mouth.    ? Multiple Vitamins-Minerals (MULTIVITAMIN WITH MINERALS) tablet Take 1 tablet by mouth daily.    ? tadalafil (CIALIS) 20 MG tablet Take 0.5-1 tablets (10-20 mg total) by mouth every other day as needed for erectile dysfunction. 5 tablet 11  ? ?No current facility-administered medications on file prior to visit.  ? ? ?Allergies  ?Allergen Reactions  ? Sulfonamide Derivatives   ? ? ?Past Medical History:  ?Diagnosis Date  ? Allergy   ? Arthritis   ? BPH (benign prostatic hypertrophy)   ? Colonic polyp   ? GERD (gastroesophageal reflux disease)   ? Has LPR  ? ? ?Past Surgical History:  ?Procedure Laterality Date  ? CATARACT EXTRACTION    ? 2011, other in 2013  ? COLONOSCOPY  2011  ? ESOPHAGOGASTRODUODENOSCOPY (EGD) WITH PROPOFOL N/A 08/30/2020  ? Procedure: ESOPHAGOGASTRODUODENOSCOPY (EGD) WITH PROPOFOL;  Surgeon: Jonathon Bellows, MD;  Location: [facility] ENDOSCOPY;  Service: Gastroenterology;  Laterality: N/A;  ? skin cancer removal    ? ? ?Family History  ?Problem Relation Age of Onset  ? Hypertension Mother   ? Heart disease Neg Hx   ? Diabetes Neg Hx   ? Cancer Neg Hx    ? ? ?Social History  ? ?Socioeconomic History  ? Marital status: Divorced  ?  Spouse name: Not on file  ? Number of children: 2  ? Years of education: Not on file  ? Highest education level: Not on file  ?Occupational History  ? Occupation: retired--car dealer/sales  ?Tobacco Use  ? Smoking status: Former  ?  Packs/day: 2.00  ?  Years: 50.00  ?  Pack years: 100.00  ?  Types: Pipe, Cigarettes  ?  Quit date: 10/20/2014  ?  Years since quitting: 6.9  ? Smokeless tobacco: Never  ? Tobacco comments:  ?  pipe   ?Substance and Sexual Activity  ? Alcohol use: No  ?  Alcohol/week: 0.0 standard drinks  ? Drug use: No  ? Sexual activity: Not on file  ?Other Topics Concern  ? Not on file  ?Social History Narrative  ? Has living will  ? Two sons and granddaughter are health care POAs (GD is first)  ? Would accept resuscitation and tube feedings if appropriate  ? ?Social Determinants of Health  ? ?Financial Resource Strain: Not on file  ?Food Insecurity: Not on file  ?Transportation Needs: Not on file  ?Physical Activity: Not on file  ?Stress: Not on file  ?Social Connections: Not on file  ?Intimate Partner Violence:  Not on file  ? ?Review of Systems ?No chest pain---other than from esophageal issues ?Breathing is okay ?No edema recently ?   ?Objective:  ? Physical Exam ?Constitutional:   ?   Appearance: Normal appearance.  ?Cardiovascular:  ?   Rate and Rhythm: Normal rate and regular rhythm.  ?   Heart sounds: No murmur heard. ?  No gallop.  ?Pulmonary:  ?   Effort: Pulmonary effort is normal.  ?   Breath sounds: Normal breath sounds. No wheezing or rales.  ?Musculoskeletal:  ?   Cervical back: Neck supple.  ?   Right lower leg: No edema.  ?   Left lower leg: No edema.  ?Lymphadenopathy:  ?   Cervical: No cervical adenopathy.  ?Neurological:  ?   Mental Status: He is alert.  ?  ? ? ? ? ?   ?Assessment & Plan:  ? ?

## 2021-09-13 NOTE — Assessment & Plan Note (Signed)
BP Readings from Last 3 Encounters:  ?09/13/21 140/82  ?08/16/21 (!) 142/105  ?05/02/21 140/88  ? ?Much better now with the amlodipine 2.'5mg'$  daily ?Will continue ?

## 2022-01-08 ENCOUNTER — Encounter: Payer: Self-pay | Admitting: Internal Medicine

## 2022-01-08 ENCOUNTER — Ambulatory Visit (INDEPENDENT_AMBULATORY_CARE_PROVIDER_SITE_OTHER): Payer: Medicare Other | Admitting: Internal Medicine

## 2022-01-08 DIAGNOSIS — T7840XA Allergy, unspecified, initial encounter: Secondary | ICD-10-CM | POA: Diagnosis not present

## 2022-01-08 DIAGNOSIS — K2100 Gastro-esophageal reflux disease with esophagitis, without bleeding: Secondary | ICD-10-CM | POA: Diagnosis not present

## 2022-01-08 MED ORDER — CEPHALEXIN 500 MG PO CAPS: 500.0000 mg | ORAL_CAPSULE | Freq: Three times a day (TID) | ORAL | 1 refills | Status: DC

## 2022-01-08 MED ORDER — PREDNISONE 20 MG PO TABS: 40.0000 mg | ORAL_TABLET | Freq: Every day | ORAL | 0 refills | Status: DC

## 2022-01-08 NOTE — Progress Notes (Signed)
Subjective:    Patient ID: Robert Vega, male    DOB: 01/24/1941, 81 y.o.   MRN: [id number]  HPI Here due to leg problems With wife  Having some swelling --left leg Did happen last year for a bit---thought it was related to working on the tractor then (but not doing that now) Some rash --using antibiotic cream  Noted about a week ago No salt No med changes No chest pain No SOB--chronic DOE but no change  May have had bite---?tick---about 3 weeks  Current Outpatient Medications on File Prior to Visit  Medication Sig Dispense Refill   acetaminophen (TYLENOL) 650 MG CR tablet Take 650 mg by mouth at bedtime.     amLODipine (NORVASC) 2.5 MG tablet Take 1 tablet (2.5 mg total) by mouth daily. 90 tablet 3   aspirin 81 MG tablet Take 81 mg by mouth daily.     famotidine (PEPCID) 20 MG tablet Take 20 mg by mouth 2 (two) times daily.     GLUCOSAMINE-CHONDROITIN-MSM PO Take by mouth.     Multiple Vitamins-Minerals (MULTIVITAMIN WITH MINERALS) tablet Take 1 tablet by mouth daily.     tadalafil (CIALIS) 20 MG tablet Take 0.5-1 tablets (10-20 mg total) by mouth every other day as needed for erectile dysfunction. 5 tablet 11   No current facility-administered medications on file prior to visit.    Allergies  Allergen Reactions   Sulfonamide Derivatives     Past Medical History:  Diagnosis Date   Allergy    Arthritis    BPH (benign prostatic hypertrophy)    Colonic polyp    GERD (gastroesophageal reflux disease)    Has LPR    Past Surgical History:  Procedure Laterality Date   CATARACT EXTRACTION     2011, other in 2013   COLONOSCOPY  2011   ESOPHAGOGASTRODUODENOSCOPY (EGD) WITH PROPOFOL N/A 08/30/2020   Procedure: ESOPHAGOGASTRODUODENOSCOPY (EGD) WITH PROPOFOL;  Surgeon: Jonathon Bellows, MD;  Location: [facility] ENDOSCOPY;  Service: Gastroenterology;  Laterality: N/A;   skin cancer removal      Family History  Problem Relation Age of Onset   Hypertension Mother    Heart  disease Neg Hx    Diabetes Neg Hx    Cancer Neg Hx     Social History   Socioeconomic History   Marital status: Divorced    Spouse name: Not on file   Number of children: 2   Years of education: Not on file   Highest education level: Not on file  Occupational History   Occupation: retired--car dealer/sales  Tobacco Use   Smoking status: Former    Packs/day: 2.00    Years: 50.00    Total pack years: 100.00    Types: Pipe, Cigarettes    Quit date: 10/20/2014    Years since quitting: 7.2   Smokeless tobacco: Never   Tobacco comments:    pipe   Substance and Sexual Activity   Alcohol use: No    Alcohol/week: 0.0 standard drinks of alcohol   Drug use: No   Sexual activity: Not on file  Other Topics Concern   Not on file  Social History Narrative   Has living will   Two sons and granddaughter are health care POAs (GD is first)   Would accept resuscitation and tube feedings if appropriate   Social Determinants of Health   Financial Resource Strain: Not on file  Food Insecurity: Not on file  Transportation Needs: Not on file  Physical Activity: Not on file  Stress: Not on file  Social Connections: Not on file  Intimate Partner Violence: Not on file   Review of Systems No leg injury Slight itching but no pain    Objective:   Physical Exam Constitutional:      Appearance: Normal appearance.  Musculoskeletal:     Comments: Mild edema in left foot/ankle/calf  Skin:    Comments: Extensive mostly macular red rash (with pinpoint red area and some confluence)---in left popliteal fossa and extending up and down about 8 cm Mild heat/tenderness Small area of red macules on medial lower left calf as well  Neurological:     Mental Status: He is alert.            Assessment & Plan:

## 2022-01-08 NOTE — Assessment & Plan Note (Signed)
Rash seems to be prominent where the bug bite occurred Has some warmth and tenderness as well--though not classic for cellulitis Will try prednisone burst Cephalexin just in case

## 2022-01-08 NOTE — Assessment & Plan Note (Signed)
Still having night cough Didn't like omeprazole Discussed increasing pepcid to 20/40

## 2022-01-12 ENCOUNTER — Telehealth: Payer: Self-pay | Admitting: Internal Medicine

## 2022-01-12 NOTE — Telephone Encounter (Signed)
Patient called in and stated that the rash is almost gone and the swelling has gone down. He stated if anything else is needed to give him a call. Thank you!

## 2022-01-12 NOTE — Telephone Encounter (Signed)
I am glad to hear he is better. I suspect more of an allergic reaction, than infection

## 2022-01-17 DIAGNOSIS — H524 Presbyopia: Secondary | ICD-10-CM | POA: Diagnosis not present

## 2022-03-26 DIAGNOSIS — H353131 Nonexudative age-related macular degeneration, bilateral, early dry stage: Secondary | ICD-10-CM | POA: Diagnosis not present

## 2022-03-29 ENCOUNTER — Ambulatory Visit (INDEPENDENT_AMBULATORY_CARE_PROVIDER_SITE_OTHER): Payer: Medicare Other | Admitting: Internal Medicine

## 2022-03-29 ENCOUNTER — Encounter: Payer: Self-pay | Admitting: Internal Medicine

## 2022-03-29 VITALS — BP 122/82 | HR 80 | Temp 97.9°F | Ht 69.0 in | Wt 240.0 lb

## 2022-03-29 DIAGNOSIS — R1319 Other dysphagia: Secondary | ICD-10-CM | POA: Diagnosis not present

## 2022-03-29 MED ORDER — LANSOPRAZOLE 30 MG PO CPDR: 30.0000 mg | DELAYED_RELEASE_CAPSULE | Freq: Every day | ORAL | 11 refills | Status: DC

## 2022-03-29 NOTE — Patient Instructions (Signed)
Stop the pepcid (famotidine) and start lansoprazole at bedtime. Let me know next week if Dr Vicente Males has not contacted you.

## 2022-03-29 NOTE — Assessment & Plan Note (Signed)
Worse than before Has known dysmotility and dilation done 4/22 Intolerant of omeprazole--but hasn't tried other PPI's Will try prevacid 30 instead of the pepcid Needs GI reevaluation and likely dilation again

## 2022-03-29 NOTE — Progress Notes (Signed)
Subjective:    Patient ID: Robert Vega, male    DOB: [birth year], 81 y.o.   MRN: [id number]  HPI Here due to esophagus problems again--with lady friend  Has been throwing up---really getting bad Even having trouble drinking anything cold Progressively worsening---has thrown up as much as 25 times Will get into coughing spells---feels like there is something caught down there  Still taking pepcid twice a day Avoids eating close to bedtime  Uses gaviscon regularly--helps a little  Current Outpatient Medications on File Prior to Visit  Medication Sig Dispense Refill   acetaminophen (TYLENOL) 650 MG CR tablet Take 650 mg by mouth at bedtime.     amLODipine (NORVASC) 2.5 MG tablet Take 1 tablet (2.5 mg total) by mouth daily. 90 tablet 3   aspirin 81 MG tablet Take 81 mg by mouth daily.     famotidine (PEPCID) 20 MG tablet Take 20 mg by mouth 2 (two) times daily.     GLUCOSAMINE-CHONDROITIN-MSM PO Take by mouth.     Multiple Vitamins-Minerals (MULTIVITAMIN WITH MINERALS) tablet Take 1 tablet by mouth daily.     tadalafil (CIALIS) 20 MG tablet Take 0.5-1 tablets (10-20 mg total) by mouth every other day as needed for erectile dysfunction. 5 tablet 11   No current facility-administered medications on file prior to visit.    Allergies  Allergen Reactions   Sulfonamide Derivatives     Past Medical History:  Diagnosis Date   Allergy    Arthritis    BPH (benign prostatic hypertrophy)    Colonic polyp    GERD (gastroesophageal reflux disease)    Has LPR    Past Surgical History:  Procedure Laterality Date   CATARACT EXTRACTION     2011, other in 2013   COLONOSCOPY  2011   ESOPHAGOGASTRODUODENOSCOPY (EGD) WITH PROPOFOL N/A 08/30/2020   Procedure: ESOPHAGOGASTRODUODENOSCOPY (EGD) WITH PROPOFOL;  Surgeon: Jonathon Bellows, MD;  Location: [facility] ENDOSCOPY;  Service: Gastroenterology;  Laterality: N/A;   skin cancer removal      Family History  Problem Relation Age of Onset    Hypertension Mother    Heart disease Neg Hx    Diabetes Neg Hx    Cancer Neg Hx     Social History   Socioeconomic History   Marital status: Divorced    Spouse name: Not on file   Number of children: 2   Years of education: Not on file   Highest education level: Not on file  Occupational History   Occupation: retired--car dealer/sales  Tobacco Use   Smoking status: Former    Packs/day: 2.00    Years: 50.00    Total pack years: 100.00    Types: Pipe, Cigarettes    Quit date: 10/20/2014    Years since quitting: 7.4    Passive exposure: Past   Smokeless tobacco: Never   Tobacco comments:    pipe   Substance and Sexual Activity   Alcohol use: No    Alcohol/week: 0.0 standard drinks of alcohol   Drug use: No   Sexual activity: Not on file  Other Topics Concern   Not on file  Social History Narrative   Has living will   Two sons and granddaughter are health care POAs (GD is first)   Would accept resuscitation and tube feedings if appropriate   Social Determinants of Health   Financial Resource Strain: Not on file  Food Insecurity: Not on file  Transportation Needs: Not on file  Physical Activity: Not on file  Stress: Not on file  Social Connections: Not on file  Intimate Partner Violence: Not on file   Review of Systems Weight has been fairly stable Sleeps on 2 pillows      Objective:   Physical Exam Constitutional:      Appearance: Normal appearance.  Abdominal:     Palpations: Abdomen is soft.     Tenderness: There is no abdominal tenderness.  Neurological:     Mental Status: He is alert.            Assessment & Plan:

## 2022-03-30 ENCOUNTER — Other Ambulatory Visit: Payer: Self-pay

## 2022-03-30 ENCOUNTER — Telehealth: Payer: Self-pay

## 2022-03-30 DIAGNOSIS — R1319 Other dysphagia: Secondary | ICD-10-CM

## 2022-03-30 NOTE — Telephone Encounter (Signed)
Called patient to ask how he was feeling and he stated that he continued to have esophageal dysphagia. Therefore, I asked him if he was interested in getting his esophagus stretched and he stated that we was. Therefore, I scheduled him to have an EGD on 04/02/2022.Patient was informed to not eat or drink after midnight and that today he would be getting a call from the endoscopy unit letting him know the time of his arrival. Patient understood and had no further questions.

## 2022-03-30 NOTE — Telephone Encounter (Signed)
-----   Message from Jonathon Bellows, MD sent at 03/30/2022  7:47 AM EDT ----- Herb Grays  Can you check with him if having dysphagia we can try stretcching him again schedule for EGD   ----- Message ----- From: [name], MD Sent: 03/29/2022   4:25 PM EDT To: Jonathon Bellows, MD  Kiran, He has bad dysphagia again and probably needs dilation again. I am trying him on prevacid. Can you get him in soon? Rich

## 2022-04-02 ENCOUNTER — Encounter: Payer: Self-pay | Admitting: Gastroenterology

## 2022-04-02 ENCOUNTER — Ambulatory Visit: Payer: Medicare Other | Admitting: Certified Registered Nurse Anesthetist

## 2022-04-02 ENCOUNTER — Observation Stay: Payer: Medicare Other

## 2022-04-02 ENCOUNTER — Encounter
Admission: AD | Disposition: A | Discharge status: Home / Self Care | Payer: Self-pay | Source: Home / Self Care | Attending: Internal Medicine

## 2022-04-02 ENCOUNTER — Inpatient Hospital Stay
Admission: AD | Admit: 2022-04-02 | Discharge: 2022-04-03 | DRG: 393 | Disposition: A | Discharge status: Home / Self Care | Payer: Medicare Other | Attending: Internal Medicine | Admitting: Internal Medicine

## 2022-04-02 ENCOUNTER — Other Ambulatory Visit: Payer: Self-pay

## 2022-04-02 DIAGNOSIS — N400 Enlarged prostate without lower urinary tract symptoms: Secondary | ICD-10-CM | POA: Diagnosis present

## 2022-04-02 DIAGNOSIS — I10 Essential (primary) hypertension: Secondary | ICD-10-CM | POA: Diagnosis present

## 2022-04-02 DIAGNOSIS — Z6834 Body mass index (BMI) 34.0-34.9, adult: Secondary | ICD-10-CM

## 2022-04-02 DIAGNOSIS — Z87891 Personal history of nicotine dependence: Secondary | ICD-10-CM | POA: Diagnosis not present

## 2022-04-02 DIAGNOSIS — R1319 Other dysphagia: Secondary | ICD-10-CM | POA: Diagnosis not present

## 2022-04-02 DIAGNOSIS — I4581 Long QT syndrome: Secondary | ICD-10-CM | POA: Diagnosis not present

## 2022-04-02 DIAGNOSIS — Z79899 Other long term (current) drug therapy: Secondary | ICD-10-CM

## 2022-04-02 DIAGNOSIS — Z7982 Long term (current) use of aspirin: Secondary | ICD-10-CM | POA: Diagnosis not present

## 2022-04-02 DIAGNOSIS — T18128A Food in esophagus causing other injury, initial encounter: Secondary | ICD-10-CM | POA: Diagnosis not present

## 2022-04-02 DIAGNOSIS — I5A Non-ischemic myocardial injury (non-traumatic): Secondary | ICD-10-CM | POA: Diagnosis not present

## 2022-04-02 DIAGNOSIS — T68XXXA Hypothermia, initial encounter: Secondary | ICD-10-CM | POA: Diagnosis present

## 2022-04-02 DIAGNOSIS — T17908A Unspecified foreign body in respiratory tract, part unspecified causing other injury, initial encounter: Secondary | ICD-10-CM | POA: Diagnosis not present

## 2022-04-02 DIAGNOSIS — Z882 Allergy status to sulfonamides status: Secondary | ICD-10-CM | POA: Diagnosis not present

## 2022-04-02 DIAGNOSIS — Z8249 Family history of ischemic heart disease and other diseases of the circulatory system: Secondary | ICD-10-CM | POA: Diagnosis not present

## 2022-04-02 DIAGNOSIS — I469 Cardiac arrest, cause unspecified: Secondary | ICD-10-CM | POA: Diagnosis present

## 2022-04-02 DIAGNOSIS — K219 Gastro-esophageal reflux disease without esophagitis: Secondary | ICD-10-CM | POA: Diagnosis present

## 2022-04-02 DIAGNOSIS — T17908D Unspecified foreign body in respiratory tract, part unspecified causing other injury, subsequent encounter: Secondary | ICD-10-CM | POA: Diagnosis not present

## 2022-04-02 DIAGNOSIS — J690 Pneumonitis due to inhalation of food and vomit: Secondary | ICD-10-CM | POA: Diagnosis not present

## 2022-04-02 DIAGNOSIS — R1310 Dysphagia, unspecified: Principal | ICD-10-CM

## 2022-04-02 DIAGNOSIS — Y929 Unspecified place or not applicable: Secondary | ICD-10-CM

## 2022-04-02 DIAGNOSIS — W44F3XA Food entering into or through a natural orifice, initial encounter: Secondary | ICD-10-CM | POA: Diagnosis present

## 2022-04-02 HISTORY — DX: Essential (primary) hypertension: I10

## 2022-04-02 HISTORY — PX: ESOPHAGOGASTRODUODENOSCOPY (EGD) WITH PROPOFOL: SHX5813

## 2022-04-02 LAB — CBC
HCT: 34.4 % — ABNORMAL LOW (ref 39.0–52.0)
Hemoglobin: 11.7 g/dL — ABNORMAL LOW (ref 13.0–17.0)
MCH: 32.4 pg (ref 26.0–34.0)
MCHC: 34 g/dL (ref 30.0–36.0)
MCV: 95.3 fL (ref 80.0–100.0)
Platelets: 260 10*3/uL (ref 150–400)
RBC: 3.61 MIL/uL — ABNORMAL LOW (ref 4.22–5.81)
RDW: 13 % (ref 11.5–15.5)
WBC: 12.7 10*3/uL — ABNORMAL HIGH (ref 4.0–10.5)
nRBC: 0 % (ref 0.0–0.2)

## 2022-04-02 LAB — BASIC METABOLIC PANEL
Anion gap: 6 (ref 5–15)
BUN: 17 mg/dL (ref 8–23)
CO2: 27 mmol/L (ref 22–32)
Calcium: 8.2 mg/dL — ABNORMAL LOW (ref 8.9–10.3)
Chloride: 106 mmol/L (ref 98–111)
Creatinine, Ser: 1 mg/dL (ref 0.61–1.24)
GFR, Estimated: >60 mL/min (ref >60)
Glucose, Bld: 108 mg/dL — ABNORMAL HIGH (ref 70–99)
Potassium: 3.8 mmol/L (ref 3.5–5.1)
Sodium: 139 mmol/L (ref 135–145)

## 2022-04-02 LAB — TROPONIN I (HIGH SENSITIVITY)
Troponin I (High Sensitivity): 18 ng/L — ABNORMAL HIGH (ref <18)
Troponin I (High Sensitivity): 15 ng/L (ref <18)

## 2022-04-02 LAB — GLUCOSE, CAPILLARY: Glucose-Capillary: 93 mg/dL (ref 70–99)

## 2022-04-02 LAB — MRSA NEXT GEN BY PCR, NASAL: MRSA by PCR Next Gen: NOT DETECTED

## 2022-04-02 SURGERY — ESOPHAGOGASTRODUODENOSCOPY (EGD) WITH PROPOFOL
Anesthesia: General

## 2022-04-02 MED ORDER — ASPIRIN 81 MG PO TBEC
81.0000 mg | DELAYED_RELEASE_TABLET | Freq: Every day | ORAL | Status: DC
  Administered 2022-04-02 – 2022-04-03 (×2): 81 mg via ORAL
  Filled 2022-04-02 (×2): qty 1

## 2022-04-02 MED ORDER — SUCCINYLCHOLINE CHLORIDE 200 MG/10ML IV SOSY
PREFILLED_SYRINGE | INTRAVENOUS | Status: DC | PRN
  Administered 2022-04-02: 200 mg via INTRAVENOUS

## 2022-04-02 MED ORDER — SODIUM CHLORIDE 0.9 % IV SOLN: INTRAVENOUS | Status: DC

## 2022-04-02 MED ORDER — ACETAMINOPHEN 650 MG RE SUPP: 650.0000 mg | Freq: Four times a day (QID) | RECTAL | Status: DC | PRN

## 2022-04-02 MED ORDER — AMOXICILLIN-POT CLAVULANATE 875-125 MG PO TABS
1.0000 | ORAL_TABLET | Freq: Two times a day (BID) | ORAL | Status: DC
  Administered 2022-04-02 – 2022-04-03 (×3): 1 via ORAL
  Filled 2022-04-02 (×4): qty 1

## 2022-04-02 MED ORDER — PANTOPRAZOLE SODIUM 20 MG PO TBEC
20.0000 mg | DELAYED_RELEASE_TABLET | Freq: Every day | ORAL | Status: DC
  Administered 2022-04-02 – 2022-04-03 (×2): 20 mg via ORAL
  Filled 2022-04-02 (×2): qty 1

## 2022-04-02 MED ORDER — PROPOFOL 10 MG/ML IV BOLUS
INTRAVENOUS | Status: DC | PRN
  Administered 2022-04-02: 50 mg via INTRAVENOUS

## 2022-04-02 MED ORDER — ACETAMINOPHEN 325 MG PO TABS: 650.0000 mg | ORAL_TABLET | Freq: Four times a day (QID) | ORAL | Status: DC | PRN

## 2022-04-02 MED ORDER — PROPOFOL 500 MG/50ML IV EMUL
INTRAVENOUS | Status: DC | PRN
Start: 2022-04-02 — End: 2022-04-02
  Administered 2022-04-02: 160 ug/kg/min via INTRAVENOUS

## 2022-04-02 MED ORDER — ALBUTEROL SULFATE (2.5 MG/3ML) 0.083% IN NEBU: 3.0000 mL | INHALATION_SOLUTION | RESPIRATORY_TRACT | Status: DC | PRN

## 2022-04-02 MED ORDER — HYDRALAZINE HCL 20 MG/ML IJ SOLN: 5.0000 mg | INTRAMUSCULAR | Status: DC | PRN

## 2022-04-02 MED ORDER — DM-GUAIFENESIN ER 30-600 MG PO TB12: 1.0000 | ORAL_TABLET | Freq: Two times a day (BID) | ORAL | Status: DC | PRN

## 2022-04-02 MED ORDER — ROCURONIUM BROMIDE 100 MG/10ML IV SOLN
INTRAVENOUS | Status: DC | PRN
  Administered 2022-04-02: 20 mg via INTRAVENOUS

## 2022-04-02 MED ORDER — AMLODIPINE BESYLATE 5 MG PO TABS
2.5000 mg | ORAL_TABLET | Freq: Every day | ORAL | Status: DC
  Administered 2022-04-03: 2.5 mg via ORAL
  Filled 2022-04-02: qty 1

## 2022-04-02 MED ORDER — ONDANSETRON HCL 4 MG/2ML IJ SOLN: 4.0000 mg | Freq: Three times a day (TID) | INTRAMUSCULAR | Status: DC | PRN

## 2022-04-02 MED ORDER — LIDOCAINE HCL (CARDIAC) PF 100 MG/5ML IV SOSY
PREFILLED_SYRINGE | INTRAVENOUS | Status: DC | PRN
Start: 2022-04-02 — End: 2022-04-02
  Administered 2022-04-02: 100 mg via INTRAVENOUS

## 2022-04-02 MED ORDER — CHLORHEXIDINE GLUCONATE CLOTH 2 % EX PADS
6.0000 | MEDICATED_PAD | Freq: Every day | CUTANEOUS | Status: DC
  Administered 2022-04-03: 6 via TOPICAL

## 2022-04-02 MED ORDER — PHENYLEPHRINE HCL (PRESSORS) 10 MG/ML IV SOLN
INTRAVENOUS | Status: DC | PRN
  Administered 2022-04-02 (×2): 80 ug via INTRAVENOUS

## 2022-04-02 MED ORDER — SUGAMMADEX SODIUM 200 MG/2ML IV SOLN
INTRAVENOUS | Status: DC | PRN
  Administered 2022-04-02: 213.2 mg via INTRAVENOUS

## 2022-04-02 MED ORDER — ADULT MULTIVITAMIN W/MINERALS CH
1.0000 | ORAL_TABLET | Freq: Every day | ORAL | Status: DC
  Administered 2022-04-02 – 2022-04-03 (×2): 1 via ORAL
  Filled 2022-04-02 (×2): qty 1

## 2022-04-02 MED ORDER — ENOXAPARIN SODIUM 60 MG/0.6ML IJ SOSY
0.5000 mg/kg | PREFILLED_SYRINGE | INTRAMUSCULAR | Status: DC
  Administered 2022-04-02: 52.5 mg via SUBCUTANEOUS
  Filled 2022-04-02 (×2): qty 0.6

## 2022-04-02 NOTE — H&P (Signed)
Jonathon Bellows, MD [address] [place], [place], [place], [address] [address], [place], [place], [place], [address] Phone: [phone]  Fax: [phone]  Primary Care Physician:  [name], MD   Pre-Procedure History & Physical: HPI:  Robert Vega is a 81 y.o. male is here for an endoscopy    Past Medical History:  Diagnosis Date   Allergy    Arthritis    BPH (benign prostatic hypertrophy)    Colonic polyp    GERD (gastroesophageal reflux disease)    Has LPR   Hypertension     Past Surgical History:  Procedure Laterality Date   CATARACT EXTRACTION     2011, other in 2013   COLONOSCOPY  2011   ESOPHAGOGASTRODUODENOSCOPY (EGD) WITH PROPOFOL N/A 08/30/2020   Procedure: ESOPHAGOGASTRODUODENOSCOPY (EGD) WITH PROPOFOL;  Surgeon: Jonathon Bellows, MD;  Location: [facility] ENDOSCOPY;  Service: Gastroenterology;  Laterality: N/A;   skin cancer removal      Prior to Admission medications   Medication Sig Start Date End Date Taking? Authorizing Provider  amLODipine (NORVASC) 2.5 MG tablet Take 1 tablet (2.5 mg total) by mouth daily. 09/13/21 09/13/22 Yes [name], MD  acetaminophen (TYLENOL) 650 MG CR tablet Take 650 mg by mouth at bedtime.    [provider]  aspirin 81 MG tablet Take 81 mg by mouth daily.    [provider]  GLUCOSAMINE-CHONDROITIN-MSM PO Take by mouth.    [provider]  lansoprazole (PREVACID) 30 MG capsule Take 1 capsule (30 mg total) by mouth daily. At bedtime 03/29/22   [name], MD  Multiple Vitamins-Minerals (MULTIVITAMIN WITH MINERALS) tablet Take 1 tablet by mouth daily.    [provider]  tadalafil (CIALIS) 20 MG tablet Take 0.5-1 tablets (10-20 mg total) by mouth every other day as needed for erectile dysfunction. 09/13/21   [name], MD    Allergies as of 03/30/2022 - Review Complete 03/29/2022  Allergen Reaction Noted   Sulfonamide derivatives  04/17/2007    Family History   Problem Relation Age of Onset   Hypertension Mother    Heart disease Neg Hx    Diabetes Neg Hx    Cancer Neg Hx     Social History   Socioeconomic History   Marital status: Divorced    Spouse name: Not on file   Number of children: 2   Years of education: Not on file   Highest education level: Not on file  Occupational History   Occupation: retired--car dealer/sales  Tobacco Use   Smoking status: Former    Packs/day: 2.00    Years: 50.00    Total pack years: 100.00    Types: Pipe, Cigarettes    Quit date: 10/20/2014    Years since quitting: 7.4    Passive exposure: Past   Smokeless tobacco: Never   Tobacco comments:    pipe   Vaping Use   Vaping Use: Never used  Substance and Sexual Activity   Alcohol use: No    Alcohol/week: 0.0 standard drinks of alcohol   Drug use: No   Sexual activity: Not on file  Other Topics Concern   Not on file  Social History Narrative   Has living will   Two sons and granddaughter are health care POAs (GD is first)   Would accept resuscitation and tube feedings if appropriate   Social Determinants of Health   Financial Resource Strain: Not on file  Food Insecurity: Not on  file  Transportation Needs: Not on file  Physical Activity: Not on file  Stress: Not on file  Social Connections: Not on file  Intimate Partner Violence: Not on file    Review of Systems: See HPI, otherwise negative ROS  Physical Exam: BP (!) 145/95   Pulse 67   Temp (!) 97.3 F (36.3 C) (Temporal)   Resp 18   Ht '5\' 9"'$  (1.753 m)   Wt 106.6 kg   SpO2 97%   BMI 34.70 kg/m  General:   Alert,  pleasant and cooperative in NAD Head:  Normocephalic and atraumatic. Neck:  Supple; no masses or thyromegaly. Lungs:  Clear throughout to auscultation, normal respiratory effort.    Heart:  +S1, +S2, Regular rate and rhythm, No edema. Abdomen:  Soft, nontender and nondistended. Normal bowel sounds, without guarding, and without rebound.   Neurologic:  Alert and   oriented x4;  grossly normal neurologically.  Impression/Plan: Robert Vega is here for an endoscopy  to be performed for  evaluation of dysphagia    Risks, benefits, limitations, and alternatives regarding endoscopy have been reviewed with the patient.  Questions have been answered.  All parties agreeable.   Jonathon Bellows, MD  04/02/2022, 8:34 AM

## 2022-04-02 NOTE — Anesthesia Procedure Notes (Signed)
Procedure Name: Intubation Date/Time: 04/02/2022 9:01 AM  Performed by: Demetrius Charity, CRNAPre-anesthesia Checklist: Patient identified, Patient being monitored, Timeout performed, Emergency Drugs available and Suction available Patient Re-evaluated:Patient Re-evaluated prior to induction Oxygen Delivery Method: Circle system utilized Preoxygenation: Pre-oxygenation with 100% oxygen Induction Type: IV induction Laryngoscope Size: McGraph and 4 Grade View: Grade II Tube type: Oral Tube size: 7.0 mm Number of attempts: 1 Airway Equipment and Method: Stylet and Video-laryngoscopy Placement Confirmation: ETT inserted through vocal cords under direct vision, positive ETCO2 and breath sounds checked- equal and bilateral Secured at: 24 cm Tube secured with: Tape Dental Injury: Teeth and Oropharynx as per pre-operative assessment  Comments: Pt with large chunks of food in oropharynx making it difficult to obtain view of cords.  Unsuccessful attempt x1 byb Crna.  Dr. Amie Critchley with placement of ETT.

## 2022-04-02 NOTE — Op Note (Signed)
[facility] Gastroenterology Patient Name: Robert Vega Procedure Date: 04/02/2022 8:35 AM MRN: [id number] Account #: [id number] Date of Birth: 07/14/1940 Admit Type: Outpatient Age: 81 Room: [facility] ENDO ROOM 1 Gender: Male Note Status: Finalized Instrument Name: Upper Endoscope [id number] Procedure:             Upper GI endoscopy Indications:           Dysphagia Providers:             Jonathon Bellows MD, MD Referring MD:          Cletis Athens, MD (Referring MD) Medicines:             Monitored Anesthesia Care, General Anesthesia Complications:         Cardiopulmonary arrest Procedure:             Pre-Anesthesia Assessment:                        - Prior to the procedure, a History and Physical was                         performed, and patient medications, allergies and                         sensitivities were reviewed. The patient's tolerance                         of previous anesthesia was reviewed.                        - The risks and benefits of the procedure and the                         sedation options and risks were discussed with the                         patient. All questions were answered and informed                         consent was obtained.                        - ASA Grade Assessment: II - A patient with mild                         systemic disease.                        After obtaining informed consent, the endoscope was                         passed under direct vision. Throughout the procedure,                         the patient's blood pressure, pulse, and oxygen                         saturations were monitored continuously. The Endoscope                         was introduced  through the mouth, and advanced to the                         third part of duodenum. The upper GI endoscopy was                         extremely difficult due to presence of food, the                         patient's cardiovascular instability and the  patient's                         oxygen desaturation.As soon as i noted food in the                         esophagus, the endoscope was pulled out and                         subsequently the food started pouring out of his mouth                         and additional staff came rushing into the room to                         assist, there was a flat line on the cardiac monitor                         and CPR was started right away , at end of 1st cycle                         pulse retuned on checking , was intubated prior to                         that . EGd completed to remove food debris in                         esophagus and mouth Successful completion of the                         procedure was aided by {skip}. The patient tolerated                         the procedure poorly due to the patient's                         cardiovascular instability. Findings:      Food was found in the upper third of the esophagus. Initially large qty       of food was seen in the upper esophagus following which the endoscope       was withdrawn , after intubation scope was re introduced and moderate       qty of food seen throughout the esophagus , mushy in nature with chunks       of meat and fruit like material seen , large qty taken out with roth net       , no peristalisis seen in the esophagus. no obstruction seen Removal  was       accomplished with a Jabier Mutton net.      The stomach was normal.      The cardia and gastric fundus were normal on retroflexion.      The examined duodenum was normal. Impression:            - Food in the upper third of the esophagus. Removal                         was successful.                        - Normal stomach.                        - Normal examined duodenum. Recommendation:        - Admit the patient to ICU until patient is stable.                        - NPO. Procedure Code(s):     --- Professional ---                        [phone],  Esophagogastroduodenoscopy, flexible,                         transoral; with removal of foreign body(s) Diagnosis Code(s):     --- Professional ---                        S49.675F, Food in esophagus causing other injury,                         initial encounter                        R13.10, Dysphagia, unspecified CPT copyright 2022 American Medical Association. All rights reserved. The codes documented in this report are preliminary and upon coder review may  be revised to meet current compliance requirements. Jonathon Bellows, MD Jonathon Bellows MD, MD 04/02/2022 9:37:37 AM This report has been signed electronically. Number of Addenda: 0 Note Initiated On: 04/02/2022 8:35 AM Estimated Blood Loss:  Estimated blood loss: none.      [facility]

## 2022-04-02 NOTE — Progress Notes (Signed)
Upper Endoscpy completed by Dr Vicente Males.  Once patient stabilized, the patient transferred to ICU 9 by myself and CRNA.  Bedside report provided by Dr Amie Critchley and myself.  Patient's family friend updated on procedural event and she was escorted by an ENDO staff member to the ICU waiting room.

## 2022-04-02 NOTE — Transfer of Care (Signed)
Immediate Anesthesia Transfer of Care Note  Patient: Robert Vega  Procedure(s) Performed: ESOPHAGOGASTRODUODENOSCOPY (EGD) WITH PROPOFOL  Patient Location: ICU  Anesthesia Type:General  Level of Consciousness: awake, alert , and oriented  Airway & Oxygen Therapy: Patient Spontanous Breathing and Patient connected to face mask oxygen  Post-op Assessment: Report given to RN and Post -op Vital signs reviewed and stable  Post vital signs: Reviewed and stable  Last Vitals:  Vitals Value Taken Time  BP    Temp    Pulse    Resp    SpO2      Last Pain:  Vitals:   04/02/22 0805  TempSrc: Temporal  PainSc: 0-No pain         Complications: No notable events documented.

## 2022-04-02 NOTE — H&P (Signed)
History and Physical    DONTREAL MIERA MPN:[id number] DOB: [birth year] DOA: 04/02/2022  Referring MD/NP/PA:   PCP: [name], MD   Patient coming from:  The patient is coming from home.  At baseline, pt is independent for most of ADL.        Chief Complaint: Dysphagia  HPI: Robert Vega is a 81 y.o. male with medical history significant of hypertension, GERD, BPH, obesity obesity BMI 34.70, dysphagia, who is presents with dysphagia.  Patient has dysphagia for several months.  Patient is scheduled for EGD today by Dr. Vicente Males of GI today. Per Dr. Vicente Males, the upper GI endoscopy was extremely difficult due to presence of food. Food in the upper third of the esophagus was removed successful, but pt developed cardiovascular instability and oxygen desaturation, requiring 1 cycle of CPR.  Per Dr. Vicente Males, patient aspirated.  Pt was intubated, and was already extubated when I saw pt. Currently patient complains of frontal chest wall pain, which is moderate, sharp, nonradiating.  Patient has mild dry cough, denies shortness breath.  No fever or chills.  Patient does not have nausea vomiting, diarrhea or abdominal pain.  No symptoms of UTI.  Data reviewed independently and ED Course: pt was found to have trop 18, WBC 12.7, GFR> 60, temperature 97.3, blood pressure 145/95, heart rate 67, RR 18.  Patient is admitted to stepdown as inpatient  EKG: I have personally reviewed.  Sinus rhythm, QTc 454, early R wave progression, occasional PAC.   Review of Systems:   General: no fevers, chills, no body weight gain, has fatigue HEENT: no blurry vision, hearing changes or sore throat Respiratory: no dyspnea, has coughing, no wheezing CV: no chest pain, no palpitations GI: no nausea, vomiting, abdominal pain, diarrhea, constipation GU: no dysuria, burning on urination, increased urinary frequency, hematuria  Ext: has trace leg edema Neuro: no unilateral weakness, numbness, or tingling, no vision change  or hearing loss Skin: no rash, no skin tear. MSK: No muscle spasm, no deformity, no limitation of range of movement in spin. Has front chest wall pain Heme: No easy bruising.  Travel history: No recent long distant travel.   Allergy:  Allergies  Allergen Reactions   Sulfonamide Derivatives     Past Medical History:  Diagnosis Date   Allergy    Arthritis    BPH (benign prostatic hypertrophy)    Colonic polyp    GERD (gastroesophageal reflux disease)    Has LPR   Hypertension     Past Surgical History:  Procedure Laterality Date   CATARACT EXTRACTION     2011, other in 2013   COLONOSCOPY  2011   ESOPHAGOGASTRODUODENOSCOPY (EGD) WITH PROPOFOL N/A 08/30/2020   Procedure: ESOPHAGOGASTRODUODENOSCOPY (EGD) WITH PROPOFOL;  Surgeon: Jonathon Bellows, MD;  Location: [facility] ENDOSCOPY;  Service: Gastroenterology;  Laterality: N/A;   skin cancer removal      Social History:  reports that he quit smoking about 7 years ago. His smoking use included pipe and cigarettes. He has a 100.00 pack-year smoking history. He has been exposed to tobacco smoke. He has never used smokeless tobacco. He reports that he does not drink alcohol and does not use drugs.  Family History:  Family History  Problem Relation Age of Onset   Hypertension Mother    Heart disease Neg Hx    Diabetes Neg Hx    Cancer Neg Hx      Prior to Admission medications   Medication Sig Start Date End Date Taking?  Authorizing Provider  amLODipine (NORVASC) 2.5 MG tablet Take 1 tablet (2.5 mg total) by mouth daily. 09/13/21 09/13/22 Yes [name], MD  acetaminophen (TYLENOL) 650 MG CR tablet Take 650 mg by mouth at bedtime.    [provider]  aspirin 81 MG tablet Take 81 mg by mouth daily.    [provider]  GLUCOSAMINE-CHONDROITIN-MSM PO Take by mouth.    [provider]  lansoprazole (PREVACID) 30 MG capsule Take 1 capsule (30 mg total) by mouth daily. At bedtime 03/29/22   [name], MD   Multiple Vitamins-Minerals (MULTIVITAMIN WITH MINERALS) tablet Take 1 tablet by mouth daily.    [provider]  tadalafil (CIALIS) 20 MG tablet Take 0.5-1 tablets (10-20 mg total) by mouth every other day as needed for erectile dysfunction. 09/13/21   [name], MD    Physical Exam: Vitals:   04/02/22 1645 04/02/22 1700 04/02/22 1707 04/02/22 1800  BP: 139/64  (!) 126/54 (!) 105/49  Pulse: 82  81 74  Resp: 20  (!) 23 (!) 22  Temp:  99.1 F (37.3 C)    TempSrc:  Oral    SpO2: 94%  95% 95%  Weight:      Height:       General: Not in acute distress HEENT:       Eyes: PERRL, EOMI, no scleral icterus.       ENT: No discharge from the ears and nose, no pharynx injection, no tonsillar enlargement.        Neck: No JVD, no bruit, no mass felt. Heme: No neck lymph node enlargement. Cardiac: S1/S2, RRR, No murmurs, No gallops or rubs. Respiratory: No rales, wheezing, rhonchi or rubs. GI: Soft, nondistended, nontender, no rebound pain, no organomegaly, BS present. GU: No hematuria Ext: has trace leg edema bilaterally. 1+DP/PT pulse bilaterally. Musculoskeletal: Has front chest wall tenderness Skin: No rashes.  Neuro: Alert, oriented X3, cranial nerves II-XII grossly intact, moves all extremities normally.  Psych: Patient is not psychotic, no suicidal or hemocidal ideation.  Labs on Admission: I have personally reviewed following labs and imaging studies  CBC: Recent Labs  Lab 04/02/22 1205  WBC 12.7*  HGB 11.7*  HCT 34.4*  MCV 95.3  PLT 474   Basic Metabolic Panel: Recent Labs  Lab 04/02/22 1205  NA 139  K 3.8  CL 106  CO2 27  GLUCOSE 108*  BUN 17  CREATININE 1.00  CALCIUM 8.2*   GFR: Estimated Creatinine Clearance: 67.4 mL/min (by C-G formula based on SCr of 1 mg/dL). Liver Function Tests: No results for input(s): "AST", "ALT", "ALKPHOS", "BILITOT", "PROT", "ALBUMIN" in the last 168 hours. No results for input(s): "LIPASE", "AMYLASE" in the last  168 hours. No results for input(s): "AMMONIA" in the last 168 hours. Coagulation Profile: No results for input(s): "INR", "PROTIME" in the last 168 hours. Cardiac Enzymes: No results for input(s): "CKTOTAL", "CKMB", "CKMBINDEX", "TROPONINI" in the last 168 hours. BNP (last 3 results) No results for input(s): "PROBNP" in the last 8760 hours. HbA1C: No results for input(s): "HGBA1C" in the last 72 hours. CBG: Recent Labs  Lab 04/02/22 0957  GLUCAP 93   Lipid Profile: No results for input(s): "CHOL", "HDL", "LDLCALC", "TRIG", "CHOLHDL", "LDLDIRECT" in the last 72 hours. Thyroid Function Tests: No results for input(s): "TSH", "T4TOTAL", "FREET4", "T3FREE", "THYROIDAB" in the last 72 hours. Anemia Panel: No results for input(s): "VITAMINB12", "FOLATE", "FERRITIN", "TIBC", "IRON", "RETICCTPCT" in the last 72 hours. Urine analysis: No results found  for: "COLORURINE", "APPEARANCEUR", "LABSPEC", "PHURINE", "GLUCOSEU", "HGBUR", "BILIRUBINUR", "KETONESUR", "PROTEINUR", "UROBILINOGEN", "NITRITE", "LEUKOCYTESUR" Sepsis Labs: '@LABRCNTIP'$ (procalcitonin:4,lacticidven:4) ) Recent Results (from the past 240 hour(s))  MRSA Next Gen by PCR, Nasal     Status: None   Collection Time: 04/02/22 10:59 AM   Specimen: Nasal Mucosa; Nasal Swab  Result Value Ref Range Status   MRSA by PCR Next Gen NOT DETECTED NOT DETECTED Final    Comment: (NOTE) The GeneXpert MRSA Assay (FDA approved for NASAL specimens only), is one component of a comprehensive MRSA colonization surveillance program. It is not intended to diagnose MRSA infection nor to guide or monitor treatment for MRSA infections. Test performance is not FDA approved in patients less than 87 years old. Performed at [facility], [address], [place], [place] [address]      Radiological Exams on Admission: No results found.    Assessment/Plan Active Problems:   Dysphagia   Aspiration into airway   Cardiac arrest (HCC)    Myocardial injury   GERD (gastroesophageal reflux disease)   Essential hypertension, benign   Hypothermia   Morbid obesity ([place])   Assessment and Plan:  Dysphagia: Patient underwent EGD by Dr. Vicente Males of GI.  Food in the upper third of the esophagus was removed successfuly.  -place in SUD for obs -Liquid food is okay per Dr. Vicente Males -f/u Dr. Georgeann Oppenheim further recommendation  Aspiration into airway: Patient developed leukocytosis with WBC 12.7. -started Augmentin -Follow-up sputum culture -Follow-up chest x-ray  Cardiac arrest and myocardial injury: Troponin level 18, 15. -Telemetry monitoring -Aspirin  GERD (gastroesophageal reflux disease) -Protonix  Essential hypertension, benign -IV hydralazine as needed -Amlodipine  Hypothermia: resolved.  Body temperature 97.3 --> 99.1  Morbid obesity ([place]): BMI= 34.7   and BW= 106.6 -Diet and exercise.   -Encourage to lose weight.       DVT ppx: SQ Lovenox  Code Status: Full code  Family Communication: I offered to call his family, but patient states that I do not need to call his family.  He will call them by himself.  Disposition Plan:  Anticipate discharge back to previous environment  Consults called:  Dr. Vicente Males of GI  Admission status and Level of care: Stepdown:   for obs     Dispo: The patient is from: Home              Anticipated d/c is to: Home              Anticipated d/c date is: 1 day              Patient currently is not medically stable to d/c.    Severity of Illness:  The appropriate patient status for this patient is OBSERVATION. Observation status is judged to be reasonable and necessary in order to provide the required intensity of service to ensure the patient's safety. The patient's presenting symptoms, physical exam findings, and initial radiographic and laboratory data in the context of their medical condition is felt to place them at decreased risk for further clinical deterioration. Furthermore, it  is anticipated that the patient will be medically stable for discharge from the hospital within 2 midnights of admission.        Date of Service 04/02/2022    [facility]   If 7PM-7AM, please contact night-coverage [email or web address] 04/02/2022, 6:28 PM

## 2022-04-02 NOTE — Progress Notes (Signed)
Food removed by Dr Vicente Males during Upper Endoscopy

## 2022-04-02 NOTE — Anesthesia Procedure Notes (Signed)
Date/Time: 04/02/2022 8:57 AM  Performed by: Demetrius Charity, CRNAPre-anesthesia Checklist: Patient identified, Emergency Drugs available, Suction available, Patient being monitored and Timeout performed Patient Re-evaluated:Patient Re-evaluated prior to induction Oxygen Delivery Method: Nasal cannula Induction Type: IV induction Airway Equipment and Method: Bite block Placement Confirmation: CO2 detector and positive ETCO2

## 2022-04-02 NOTE — Anesthesia Postprocedure Evaluation (Signed)
Anesthesia Post Note  Patient: Robert Vega  Procedure(s) Performed: ESOPHAGOGASTRODUODENOSCOPY (EGD) WITH PROPOFOL  Patient location during evaluation: SICU Anesthesia Type: General Level of consciousness: awake Pain management: pain level controlled Vital Signs Assessment: post-procedure vital signs reviewed and stable Respiratory status: spontaneous breathing and patient connected to nasal cannula oxygen Cardiovascular status: stable Postop Assessment: no apparent nausea or vomiting Comments: Concern for aspiration during his endoscopy.   No notable events documented.   Last Vitals:  Vitals:   04/02/22 1031 04/02/22 1045  BP: (!) 115/56 121/66  Pulse: 67 70  Resp: 19 (!) 23  Temp:    SpO2: 91% 93%    Last Pain:  Vitals:   04/02/22 1000  TempSrc: Oral  PainSc:                  Precious Haws Vera Furniss

## 2022-04-02 NOTE — Anesthesia Preprocedure Evaluation (Addendum)
Anesthesia Evaluation  Patient identified by MRN, date of birth, ID band Patient awake    Reviewed: Allergy & Precautions, NPO status , Patient's Chart, lab work & pertinent test results  History of Anesthesia Complications Negative for: history of anesthetic complications  Airway Mallampati: III  TM Distance: <3 FB Neck ROM: full    Dental  (+) Chipped, Poor Dentition, Missing   Pulmonary neg shortness of breath, former smoker   Pulmonary exam normal        Cardiovascular Exercise Tolerance: Good hypertension, (-) angina Normal cardiovascular exam     Neuro/Psych  PSYCHIATRIC DISORDERS      negative neurological ROS     GI/Hepatic Neg liver ROS,GERD  Controlled,,  Endo/Other  negative endocrine ROS    Renal/GU negative Renal ROS  negative genitourinary   Musculoskeletal   Abdominal   Peds  Hematology negative hematology ROS (+)   Anesthesia Other Findings Patient reports that they do not think that any food or pills are stuck in their throat at this time.  Past Medical History: No date: Allergy No date: Arthritis No date: BPH (benign prostatic hypertrophy) No date: Colonic polyp No date: GERD (gastroesophageal reflux disease)     Comment:  Has LPR No date: Hypertension  Past Surgical History: No date: CATARACT EXTRACTION     Comment:  2011, other in 2013 2011: COLONOSCOPY 08/30/2020: ESOPHAGOGASTRODUODENOSCOPY (EGD) WITH PROPOFOL; N/A     Comment:  Procedure: ESOPHAGOGASTRODUODENOSCOPY (EGD) WITH               PROPOFOL;  Surgeon: Jonathon Bellows, MD;  Location: [facility]               ENDOSCOPY;  Service: Gastroenterology;  Laterality: N/A; No date: skin cancer removal     Reproductive/Obstetrics negative OB ROS                             Anesthesia Physical Anesthesia Plan  ASA: 3  Anesthesia Plan: General   Post-op Pain Management:    Induction: Intravenous  PONV Risk  Score and Plan: Propofol infusion and TIVA  Airway Management Planned: Natural Airway and Nasal Cannula  Additional Equipment:   Intra-op Plan:   Post-operative Plan:   Informed Consent: I have reviewed the patients History and Physical, chart, labs and discussed the procedure including the risks, benefits and alternatives for the proposed anesthesia with the patient or authorized representative who has indicated his/her understanding and acceptance.     Dental Advisory Given  Plan Discussed with: Anesthesiologist, CRNA and Surgeon  Anesthesia Plan Comments: (Patient consented for risks of anesthesia including but not limited to:  - adverse reactions to medications - risk of airway placement if required - damage to eyes, teeth, lips or other oral mucosa - nerve damage due to positioning  - sore throat or hoarseness - Damage to heart, brain, nerves, lungs, other parts of body or loss of life  Patient voiced understanding.)       Anesthesia Quick Evaluation

## 2022-04-02 NOTE — Progress Notes (Signed)
   04/02/22 0900  Clinical Encounter Type  Visited With Patient;Health care provider  Visit Type Initial  Referral From Nurse  Consult/Referral To Chaplain   Chaplain responded to code blue. Medical team at bedside. No family located in waiting area. Chaplain services are available for follow up as needed.

## 2022-04-03 ENCOUNTER — Other Ambulatory Visit: Payer: Self-pay

## 2022-04-03 ENCOUNTER — Encounter: Payer: Self-pay | Admitting: Gastroenterology

## 2022-04-03 ENCOUNTER — Telehealth: Payer: Self-pay

## 2022-04-03 ENCOUNTER — Telehealth: Payer: Self-pay | Admitting: Gastroenterology

## 2022-04-03 DIAGNOSIS — N400 Enlarged prostate without lower urinary tract symptoms: Secondary | ICD-10-CM | POA: Diagnosis present

## 2022-04-03 DIAGNOSIS — R1319 Other dysphagia: Secondary | ICD-10-CM

## 2022-04-03 DIAGNOSIS — Z7982 Long term (current) use of aspirin: Secondary | ICD-10-CM | POA: Diagnosis not present

## 2022-04-03 DIAGNOSIS — Z8249 Family history of ischemic heart disease and other diseases of the circulatory system: Secondary | ICD-10-CM | POA: Diagnosis not present

## 2022-04-03 DIAGNOSIS — Z87891 Personal history of nicotine dependence: Secondary | ICD-10-CM | POA: Diagnosis not present

## 2022-04-03 DIAGNOSIS — T18128A Food in esophagus causing other injury, initial encounter: Secondary | ICD-10-CM | POA: Diagnosis present

## 2022-04-03 DIAGNOSIS — Z79899 Other long term (current) drug therapy: Secondary | ICD-10-CM | POA: Diagnosis not present

## 2022-04-03 DIAGNOSIS — R1310 Dysphagia, unspecified: Secondary | ICD-10-CM | POA: Diagnosis not present

## 2022-04-03 DIAGNOSIS — Z6834 Body mass index (BMI) 34.0-34.9, adult: Secondary | ICD-10-CM | POA: Diagnosis not present

## 2022-04-03 DIAGNOSIS — K219 Gastro-esophageal reflux disease without esophagitis: Secondary | ICD-10-CM | POA: Diagnosis present

## 2022-04-03 DIAGNOSIS — T17908D Unspecified foreign body in respiratory tract, part unspecified causing other injury, subsequent encounter: Secondary | ICD-10-CM | POA: Diagnosis not present

## 2022-04-03 DIAGNOSIS — I10 Essential (primary) hypertension: Secondary | ICD-10-CM | POA: Diagnosis present

## 2022-04-03 DIAGNOSIS — I5A Non-ischemic myocardial injury (non-traumatic): Secondary | ICD-10-CM | POA: Diagnosis not present

## 2022-04-03 DIAGNOSIS — Y929 Unspecified place or not applicable: Secondary | ICD-10-CM | POA: Diagnosis not present

## 2022-04-03 DIAGNOSIS — W44F3XA Food entering into or through a natural orifice, initial encounter: Secondary | ICD-10-CM | POA: Diagnosis present

## 2022-04-03 DIAGNOSIS — Z882 Allergy status to sulfonamides status: Secondary | ICD-10-CM | POA: Diagnosis not present

## 2022-04-03 DIAGNOSIS — I469 Cardiac arrest, cause unspecified: Secondary | ICD-10-CM | POA: Diagnosis not present

## 2022-04-03 LAB — CBC
HCT: 31.8 % — ABNORMAL LOW (ref 39.0–52.0)
Hemoglobin: 10.7 g/dL — ABNORMAL LOW (ref 13.0–17.0)
MCH: 32.1 pg (ref 26.0–34.0)
MCHC: 33.6 g/dL (ref 30.0–36.0)
MCV: 95.5 fL (ref 80.0–100.0)
Platelets: 222 10*3/uL (ref 150–400)
RBC: 3.33 MIL/uL — ABNORMAL LOW (ref 4.22–5.81)
RDW: 13.2 % (ref 11.5–15.5)
WBC: 14.8 10*3/uL — ABNORMAL HIGH (ref 4.0–10.5)
nRBC: 0 % (ref 0.0–0.2)

## 2022-04-03 LAB — BASIC METABOLIC PANEL
Anion gap: 4 — ABNORMAL LOW (ref 5–15)
BUN: 16 mg/dL (ref 8–23)
CO2: 27 mmol/L (ref 22–32)
Calcium: 8.1 mg/dL — ABNORMAL LOW (ref 8.9–10.3)
Chloride: 106 mmol/L (ref 98–111)
Creatinine, Ser: 0.99 mg/dL (ref 0.61–1.24)
GFR, Estimated: >60 mL/min (ref >60)
Glucose, Bld: 115 mg/dL — ABNORMAL HIGH (ref 70–99)
Potassium: 4 mmol/L (ref 3.5–5.1)
Sodium: 137 mmol/L (ref 135–145)

## 2022-04-03 LAB — GLUCOSE, CAPILLARY: Glucose-Capillary: 101 mg/dL — ABNORMAL HIGH (ref 70–99)

## 2022-04-03 MED ORDER — OXYCODONE HCL 5 MG PO TABS: 5.0000 mg | ORAL_TABLET | Freq: Three times a day (TID) | ORAL | 0 refills | Status: DC | PRN

## 2022-04-03 MED ORDER — AMOXICILLIN-POT CLAVULANATE 875-125 MG PO TABS: 1.0000 | ORAL_TABLET | Freq: Two times a day (BID) | ORAL | 0 refills | Status: AC

## 2022-04-03 NOTE — Progress Notes (Signed)
Patient A+Ox4, VSS reviewed, DC instructions w/pt, + pt verbalizes understanding. IVS removed,volunteers arrived for patient to be wheeled to medical mall. No devices sent with patient.

## 2022-04-03 NOTE — Progress Notes (Addendum)
Patient walked around Unit, during walk patient desat to 83% and SOB, Dr.Sreenath made aware. Patient returned to his room in the chair, recovered to 93%.

## 2022-04-03 NOTE — Discharge Summary (Signed)
Physician Discharge Summary  Robert Vega WNU:[id number] DOB: May 28, 1941 DOA: 04/02/2022  PCP: Robert Carbon, MD  Admit date: 04/02/2022 Discharge date: 04/03/2022  Admitted From: Home Disposition:  Home  Recommendations for Outpatient Follow-up:  Follow up with PCP in 1-2 weeks Follow up with GI 1 week  Home Health:No  Equipment/Devices:None   Discharge Condition:Stable  CODE STATUS:FULL  Diet recommendation: Mechanical soft  Brief/Interim Summary: 81 y.o. male with medical history significant of hypertension, GERD, BPH, obesity obesity BMI 34.70, dysphagia, who is presents with dysphagia.   Patient has dysphagia for several months.  Patient is scheduled for EGD today by Dr. Vicente Males of GI today. Per Dr. Vicente Males, the upper GI endoscopy was extremely difficult due to presence of food. Food in the upper third of the esophagus was removed successful, but pt developed cardiovascular instability and oxygen desaturation, requiring 1 cycle of CPR.  Per Dr. Vicente Males, patient aspirated.  Pt was intubated, and was already extubated when I saw pt. Currently patient complains of frontal chest wall pain, which is moderate, sharp, nonradiating.  Patient has mild dry cough, denies shortness breath.  No fever or chills.  Patient does not have nausea vomiting, diarrhea or abdominal pain.  No symptoms of UTI.  Patient monitored in ICU overnight.  Remained stable.  Leukocytosis present the following morning.  CXR reviewed independently shows possible left side infiltrate.  Suspect aspiration.  Will treat with PO augmentin x 7 days. Ambulated around nursing unit, mild desaturation noted with quick recovery. Stable for dc. Will follow up with GI in 1 week.  Recommend mechanical soft diet until seen by GI.    Discharge Diagnoses:  Principal Problem:   Dysphagia Active Problems:   Aspiration into airway   Cardiac arrest (HCC)   Myocardial injury   GERD (gastroesophageal reflux disease)   Essential  hypertension, benign   Hypothermia   Morbid obesity ([place])  Dysphagia: Patient underwent EGD by Dr. Vicente Males of GI.  Food in the upper third of the esophagus was removed successfuly.  -DC home.  Mechanical soft diet. FU OP 1 week with GI   Aspiration into airway: Patient developed leukocytosis with WBC 12.7. -started Augmentin -CXR read pending at time of dc -Will treat empirically with 7 day course of augmentin  Discharge Instructions  Discharge Instructions     Diet - low sodium heart healthy   Complete by: As directed    Increase activity slowly   Complete by: As directed       Allergies as of 04/03/2022       Reactions   Sulfonamide Derivatives         Medication List     TAKE these medications    acetaminophen 650 MG CR tablet Commonly known as: TYLENOL Take 650 mg by mouth every morning.   amLODipine 2.5 MG tablet Commonly known as: NORVASC Take 1 tablet (2.5 mg total) by mouth daily.   amoxicillin-clavulanate 875-125 MG tablet Commonly known as: AUGMENTIN Take 1 tablet by mouth every 12 (twelve) hours for 7 days.   aspirin 81 MG tablet Take 81 mg by mouth daily.   GLUCOSAMINE-CHONDROITIN-MSM PO Take by mouth.   lansoprazole 30 MG capsule Commonly known as: PREVACID Take 1 capsule (30 mg total) by mouth daily. At bedtime   multivitamin with minerals tablet Take 1 tablet by mouth daily.   oxyCODONE 5 MG immediate release tablet Commonly known as: Roxicodone Take 1 tablet (5 mg total) by mouth every 8 (eight) hours as needed.  tadalafil 20 MG tablet Commonly known as: CIALIS Take 0.5-1 tablets (10-20 mg total) by mouth every other day as needed for erectile dysfunction.        Follow-up Information     Robert Bellows, MD. Schedule an appointment as soon as possible for a visit in 1 week(s).   Specialty: Gastroenterology Why: LEFT MESSAGE TO CALL us BACK . Contact information: [address] [place]  [address] [phone]         Robert Carbon, MD. Schedule an appointment as soon as possible for a visit in 1 week(s).   Specialties: Internal Medicine, Pediatrics Why: appt DR LETVAK ON MONDAY NOV.13 AT 1045AM Contact information: [place] [address] [phone]                Allergies  Allergen Reactions   Sulfonamide Derivatives     Consultations: GI   Procedures/Studies: No results found.    Subjective:   Discharge Exam: Vitals:   04/03/22 1000 04/03/22 1100  BP: (!) 120/92   Pulse: 79 91  Resp: 20 (!) 22  Temp:    SpO2: 95% 90%   Vitals:   04/03/22 0800 04/03/22 0900 04/03/22 1000 04/03/22 1100  BP: (!) 150/66 (!) 148/94 (!) 120/92   Pulse: 76 70 79 91  Resp: 16 (!) 22 20 (!) 22  Temp:      TempSrc:   Oral   SpO2: 95% 94% 95% 90%  Weight:      Height:        General: Pt is alert, awake, not in acute distress Cardiovascular: RRR, S1/S2 +, no rubs, no gallops Respiratory: CTA bilaterally, no wheezing, no rhonchi Abdominal: Soft, NT, ND, bowel sounds + Extremities: no edema, no cyanosis    The results of significant diagnostics from this hospitalization (including imaging, microbiology, ancillary and laboratory) are listed below for reference.     Microbiology: Recent Results (from the past 240 hour(s))  MRSA Next Gen by PCR, Nasal     Status: None   Collection Time: 04/02/22 10:59 AM   Specimen: Nasal Mucosa; Nasal Swab  Result Value Ref Range Status   MRSA by PCR Next Gen NOT DETECTED NOT DETECTED Final    Comment: (NOTE) The GeneXpert MRSA Assay (FDA approved for NASAL specimens only), is one component of a comprehensive MRSA colonization surveillance program. It is not intended to diagnose MRSA infection nor to guide or monitor treatment for MRSA infections. Test performance is not FDA approved in patients less than 28 years old. Performed at [facility], [address]  [place], [place] [address]      Labs: BNP (last 3 results) No results for input(s): "BNP" in the last 8760 hours. Basic Metabolic Panel: Recent Labs  Lab 04/02/22 1205 04/03/22 0313  NA 139 137  K 3.8 4.0  CL 106 106  CO2 27 27  GLUCOSE 108* 115*  BUN 17 16  CREATININE 1.00 0.99  CALCIUM 8.2* 8.1*   Liver Function Tests: No results for input(s): "AST", "ALT", "ALKPHOS", "BILITOT", "PROT", "ALBUMIN" in the last 168 hours. No results for input(s): "LIPASE", "AMYLASE" in the last 168 hours. No results for input(s): "AMMONIA" in the last 168 hours. CBC: Recent Labs  Lab 04/02/22 1205 04/03/22 0313  WBC 12.7* 14.8*  HGB 11.7* 10.7*  HCT 34.4* 31.8*  MCV 95.3 95.5  PLT 260 222   Cardiac Enzymes: No results for input(s): "CKTOTAL", "CKMB", "CKMBINDEX", "TROPONINI" in the  last 168 hours. BNP: Invalid input(s): "POCBNP" CBG: Recent Labs  Lab 04/02/22 0957 04/03/22 0738  GLUCAP 93 101*   D-Dimer No results for input(s): "DDIMER" in the last 72 hours. Hgb A1c No results for input(s): "HGBA1C" in the last 72 hours. Lipid Profile No results for input(s): "CHOL", "HDL", "LDLCALC", "TRIG", "CHOLHDL", "LDLDIRECT" in the last 72 hours. Thyroid function studies No results for input(s): "TSH", "T4TOTAL", "T3FREE", "THYROIDAB" in the last 72 hours.  Invalid input(s): "FREET3" Anemia work up No results for input(s): "VITAMINB12", "FOLATE", "FERRITIN", "TIBC", "IRON", "RETICCTPCT" in the last 72 hours. Urinalysis No results found for: "COLORURINE", "APPEARANCEUR", "LABSPEC", "PHURINE", "GLUCOSEU", "HGBUR", "BILIRUBINUR", "KETONESUR", "PROTEINUR", "UROBILINOGEN", "NITRITE", "LEUKOCYTESUR" Sepsis Labs Recent Labs  Lab 04/02/22 1205 04/03/22 0313  WBC 12.7* 14.8*   Microbiology Recent Results (from the past 240 hour(s))  MRSA Next Gen by PCR, Nasal     Status: None   Collection Time: 04/02/22 10:59 AM   Specimen: Nasal Mucosa; Nasal Swab  Result Value Ref Range Status    MRSA by PCR Next Gen NOT DETECTED NOT DETECTED Final    Comment: (NOTE) The GeneXpert MRSA Assay (FDA approved for NASAL specimens only), is one component of a comprehensive MRSA colonization surveillance program. It is not intended to diagnose MRSA infection nor to guide or monitor treatment for MRSA infections. Test performance is not FDA approved in patients less than 21 years old. Performed at [facility], [address] [place], [place] [address]      Time coordinating discharge: Over 30 minutes  SIGNED:   Sidney Ace, MD  [facility] 04/03/2022, 12:01 PM Pager   If 7PM-7AM, please contact night-coverage

## 2022-04-03 NOTE — Telephone Encounter (Signed)
Referral was faxed to [facility] for esophageal dysphagia and for patient to have an esophageal manometry done.

## 2022-04-03 NOTE — Telephone Encounter (Signed)
ICU nurse from [facility] is calling to schedule this patient a hospital follow up for 1 week with DR Vicente Males and I need to know where I can schedule him for or if you could call the ICU nurse back. Thanks

## 2022-04-04 ENCOUNTER — Telehealth: Payer: Self-pay

## 2022-04-04 NOTE — Telephone Encounter (Signed)
Transition Care Management Follow-up Telephone Call Date of discharge and from where:TCM DC [facility] 04-03-22 Dx: Dysphagia  How have you been since you were released from the hospital? Doing ok - eating solid foods  Any questions or concerns? No  Items Reviewed: Did the pt receive and understand the discharge instructions provided? Yes  Medications obtained and verified? Yes  Other? No  Any new allergies since your discharge? No  Dietary orders reviewed? Yes Do you have support at home? Yes   Home Care and Equipment/Supplies: Were home health services ordered? no If so, what is the name of the agency? na  Has the agency set up a time to come to the patient's home? not applicable Were any new equipment or medical supplies ordered?  No What is the name of the medical supply agency? na Were you able to get the supplies/equipment? not applicable Do you have any questions related to the use of the equipment or supplies? No  Functional Questionnaire: (I = Independent and D = Dependent) ADLs: I  Bathing/Dressing- I  Meal Prep- I  Eating- I  Maintaining continence- I  Transferring/Ambulation- I- cane   Managing Meds- I  Follow up appointments reviewed:  PCP Hospital f/u appt confirmed? Yes  Scheduled to [facility] Dr [facility] Yes  Scheduled to see Dr Vicente Males on 05-01-22 @ 330pm. Are transportation arrangements needed? No  If their condition worsens, is the pt aware to call PCP or go to the Emergency Dept.? Yes Was the patient provided with contact information for the PCP's office or ED? Yes Was to pt encouraged to call back with questions or concerns? Yes   Juanda Crumble LPN [facility] Direct Dial [phone]

## 2022-04-09 ENCOUNTER — Ambulatory Visit (INDEPENDENT_AMBULATORY_CARE_PROVIDER_SITE_OTHER): Payer: Medicare Other | Admitting: Internal Medicine

## 2022-04-09 ENCOUNTER — Encounter: Payer: Self-pay | Admitting: Internal Medicine

## 2022-04-09 VITALS — BP 110/78 | HR 66 | Temp 97.7°F | Ht 69.0 in | Wt 237.0 lb

## 2022-04-09 DIAGNOSIS — I5A Non-ischemic myocardial injury (non-traumatic): Secondary | ICD-10-CM | POA: Diagnosis not present

## 2022-04-09 DIAGNOSIS — K2100 Gastro-esophageal reflux disease with esophagitis, without bleeding: Secondary | ICD-10-CM | POA: Diagnosis not present

## 2022-04-09 DIAGNOSIS — J690 Pneumonitis due to inhalation of food and vomit: Secondary | ICD-10-CM

## 2022-04-09 NOTE — Assessment & Plan Note (Signed)
Clinically improved Will finish out the augmentin

## 2022-04-09 NOTE — Assessment & Plan Note (Signed)
Ongoing symptoms Had retained food in esophagus--which was removed, but then he aspirated and had cardiac arrest Dr Vicente Males has made referral to [facility] to consider manometry No evidence of achalasia in the past--but need to consider that now Will continue the prevacid 30 daily

## 2022-04-09 NOTE — Assessment & Plan Note (Signed)
Discussed that I don't think he had any worrisome cardiac damage

## 2022-04-09 NOTE — Progress Notes (Signed)
Subjective:    Patient ID: Robert Vega, male    DOB: 09-06-1940, 81 y.o.   MRN: [id number]  HPI Here for hospital follow up With significant other  Went in to have EGD---food in upper esophagus Then aspirated--needed brief CPR and then watched overnight in hospital Chest soreness from CPR is finally improved Finishing up the augmentin Cough persists but improved No SOB---but breathing a little harder  Doesn't feel his swallowing is normal---but not normal Still being careful with eating ----small food and quantities  Current Outpatient Medications on File Prior to Visit  Medication Sig Dispense Refill   acetaminophen (TYLENOL) 650 MG CR tablet Take 650 mg by mouth every morning.     amLODipine (NORVASC) 2.5 MG tablet Take 1 tablet (2.5 mg total) by mouth daily. 90 tablet 3   amoxicillin-clavulanate (AUGMENTIN) 875-125 MG tablet Take 1 tablet by mouth every 12 (twelve) hours for 7 days. 14 tablet 0   aspirin 81 MG tablet Take 81 mg by mouth daily.     GLUCOSAMINE-CHONDROITIN-MSM PO Take by mouth.     lansoprazole (PREVACID) 30 MG capsule Take 1 capsule (30 mg total) by mouth daily. At bedtime 30 capsule 11   Multiple Vitamins-Minerals (MULTIVITAMIN WITH MINERALS) tablet Take 1 tablet by mouth daily.     oxyCODONE (ROXICODONE) 5 MG immediate release tablet Take 1 tablet (5 mg total) by mouth every 8 (eight) hours as needed. 20 tablet 0   tadalafil (CIALIS) 20 MG tablet Take 0.5-1 tablets (10-20 mg total) by mouth every other day as needed for erectile dysfunction. 5 tablet 11   No current facility-administered medications on file prior to visit.    Allergies  Allergen Reactions   Sulfonamide Derivatives     Past Medical History:  Diagnosis Date   Allergy    Arthritis    BPH (benign prostatic hypertrophy)    Colonic polyp    GERD (gastroesophageal reflux disease)    Has LPR   Hypertension     Past Surgical History:  Procedure Laterality Date   CATARACT  EXTRACTION     2011, other in 2013   COLONOSCOPY  2011   ESOPHAGOGASTRODUODENOSCOPY (EGD) WITH PROPOFOL N/A 08/30/2020   Procedure: ESOPHAGOGASTRODUODENOSCOPY (EGD) WITH PROPOFOL;  Surgeon: Jonathon Bellows, MD;  Location: [facility] ENDOSCOPY;  Service: Gastroenterology;  Laterality: N/A;   ESOPHAGOGASTRODUODENOSCOPY (EGD) WITH PROPOFOL N/A 04/02/2022   Procedure: ESOPHAGOGASTRODUODENOSCOPY (EGD) WITH PROPOFOL;  Surgeon: Jonathon Bellows, MD;  Location: [facility] ENDOSCOPY;  Service: Gastroenterology;  Laterality: N/A;   skin cancer removal      Family History  Problem Relation Age of Onset   Hypertension Mother    Heart disease Neg Hx    Diabetes Neg Hx    Cancer Neg Hx     Social History   Socioeconomic History   Marital status: Divorced    Spouse name: Not on file   Number of children: 2   Years of education: Not on file   Highest education level: Not on file  Occupational History   Occupation: retired--car dealer/sales  Tobacco Use   Smoking status: Former    Packs/day: 2.00    Years: 50.00    Total pack years: 100.00    Types: Pipe, Cigarettes    Quit date: 10/20/2014    Years since quitting: 7.4    Passive exposure: Past   Smokeless tobacco: Never   Tobacco comments:    pipe   Vaping Use   Vaping Use: Never used  Substance and Sexual  Activity   Alcohol use: No    Alcohol/week: 0.0 standard drinks of alcohol   Drug use: No   Sexual activity: Not on file  Other Topics Concern   Not on file  Social History Narrative   Has living will   Two sons and granddaughter are health care POAs (GD is first)   Would accept resuscitation and tube feedings if appropriate   Social Determinants of Health   Financial Resource Strain: Not on file  Food Insecurity: No Food Insecurity (04/02/2022)   Hunger Vital Sign    Worried About Running Out of Food in the Last Year: Never true    [place] in the Last Year: Never true  Transportation Needs: No Transportation Needs (04/02/2022)   PRAPARE  - Hydrologist (Medical): No    Lack of Transportation (Non-Medical): No  Physical Activity: Not on file  Stress: Not on file  Social Connections: Not on file  Intimate Partner Violence: Not At Risk (04/02/2022)   Humiliation, Afraid, Rape, and Kick questionnaire    Fear of Current or Ex-Partner: No    Emotionally Abused: No    Physically Abused: No    Sexually Abused: No   Review of Systems No sig weight loss Sleeping okay---mostly in chair (especially if he feels stuff in his throat)     Objective:   Physical Exam Constitutional:      Appearance: Normal appearance.  Cardiovascular:     Rate and Rhythm: Normal rate and regular rhythm.     Heart sounds: No murmur heard.    No gallop.  Pulmonary:     Effort: Pulmonary effort is normal.     Breath sounds: No wheezing or rales.     Comments: Slightly reduced breath sounds on left Musculoskeletal:     Cervical back: Neck supple.  Lymphadenopathy:     Cervical: No cervical adenopathy.  Neurological:     Mental Status: He is alert.            Assessment & Plan:

## 2022-04-11 ENCOUNTER — Telehealth: Payer: Self-pay | Admitting: Internal Medicine

## 2022-04-11 DIAGNOSIS — R1319 Other dysphagia: Secondary | ICD-10-CM

## 2022-04-11 NOTE — Telephone Encounter (Signed)
Patient called and stated that he wants a referral to [facility] for his throat. Call back number [phone].

## 2022-04-12 ENCOUNTER — Telehealth: Payer: Self-pay | Admitting: Internal Medicine

## 2022-04-12 NOTE — Telephone Encounter (Signed)
Paper placed in dr Alla German inbox on his desk.

## 2022-04-12 NOTE — Telephone Encounter (Signed)
   Hilliard, Kelly M4 minutes ago (2:14 PM)   [facility] Patient came in and left a paper with information as to which Doctor he would like a referral to be sent out to for him regarding his heart condition. Paper was left up front in Dr Everardo Beals folder. He stated that Dr Silvio Pate would know what its regarding.

## 2022-04-12 NOTE — Telephone Encounter (Signed)
This information added to message already open about the referrals.

## 2022-04-12 NOTE — Telephone Encounter (Signed)
Patient came in and left a paper with information as to which Doctor he would like a referral to be sent out to for him regarding his heart condition. Paper was left up front in Dr Everardo Beals folder. He stated that Dr Silvio Pate would know what its regarding.

## 2022-04-12 NOTE — Telephone Encounter (Signed)
Spoke to pt

## 2022-04-16 ENCOUNTER — Telehealth: Payer: Self-pay

## 2022-04-16 NOTE — Progress Notes (Addendum)
Chronic Care Management Pharmacy Assistant   Name: Robert Vega  MRN: [id number] DOB: 02/22/1941  Reason for Encounter: Non-CCM [facility] Follow Up)  Medications: Outpatient Encounter Medications as of 04/16/2022  Medication Sig Note   acetaminophen (TYLENOL) 650 MG CR tablet Take 650 mg by mouth every morning.    amLODipine (NORVASC) 2.5 MG tablet Take 1 tablet (2.5 mg total) by mouth daily.    aspirin 81 MG tablet Take 81 mg by mouth daily.    GLUCOSAMINE-CHONDROITIN-MSM PO Take by mouth.    lansoprazole (PREVACID) 30 MG capsule Take 1 capsule (30 mg total) by mouth daily. At bedtime 04/02/2022: New prescription - not yet started   Multiple Vitamins-Minerals (MULTIVITAMIN WITH MINERALS) tablet Take 1 tablet by mouth daily.    oxyCODONE (ROXICODONE) 5 MG immediate release tablet Take 1 tablet (5 mg total) by mouth every 8 (eight) hours as needed.    tadalafil (CIALIS) 20 MG tablet Take 0.5-1 tablets (10-20 mg total) by mouth every other day as needed for erectile dysfunction.    No facility-administered encounter medications on file as of 04/16/2022.    Reviewed hospital notes for details of recent visit. Has patient been contacted by Transitions of Care team? Yes Has patient seen PCP/specialist for hospital follow up (summarize OV if yes): Yes  "Had retained food in esophagus--which was removed, but then he aspirated and had cardiac arrest. Dr Vicente Males has made referral to [facility] to consider manometry. No evidence of achalasia in the past--but need to consider that now. Will continue the prevacid 30 daily"  Admitted to the hospital on 04/02/2022. Discharge date was 04/03/2022.  Discharged from [facility].   Discharge diagnosis (Principal Problem): Dysphagia Patient was discharged to Home  Brief summary of hospital course: 81 y.o. male with medical history significant of hypertension, GERD, BPH, obesity obesity BMI 34.70, dysphagia, who is presents with dysphagia.    Patient has dysphagia for several months.  Patient is scheduled for EGD today by Dr. Vicente Males of GI today. Per Dr. Vicente Males, the upper GI endoscopy was extremely difficult due to presence of food. Food in the upper third of the esophagus was removed successful, but pt developed cardiovascular instability and oxygen desaturation, requiring 1 cycle of CPR.  Per Dr. Vicente Males, patient aspirated.  Pt was intubated, and was already extubated when I saw pt. Currently patient complains of frontal chest wall pain, which is moderate, sharp, nonradiating.  Patient has mild dry cough, denies shortness breath.  No fever or chills.  Patient does not have nausea vomiting, diarrhea or abdominal pain.  No symptoms of UTI.   Patient monitored in ICU overnight.  Remained stable.  Leukocytosis present the following morning.  CXR reviewed independently shows possible left side infiltrate.  Suspect aspiration.  Will treat with PO augmentin x 7 days. Ambulated around nursing unit, mild desaturation noted with quick recovery. Stable for dc. Will follow up with GI in 1 week.  Recommend mechanical soft diet until seen by GI.  New?Medications Started at [facility] Discharge:?? -Started amoxicillin-clavulanate (AUGMENTIN) 875-125 MG tablet -Started oxyCODONE (ROXICODONE) 5 MG immediate release tablet   Medications that remain the same after Hospital Discharge:??  -All other medications will remain the same.    Next CCM appt: Non-CCM  Other upcoming appts: Gastro appointment on 05/01/2022 PCP appointment on 05/04/2022 for Physical  [name], PharmD notified and will determine if action is needed.  [name], CPP notified  [name], [place] Clinical Pharmacy Assistant [phone]  Pharmacist addendum: Patient has seen PCP and has appt scheduled for GI. No further action needed.  [name], PharmD, BCACP 04/24/22 2:02 PM

## 2022-05-01 ENCOUNTER — Telehealth (INDEPENDENT_AMBULATORY_CARE_PROVIDER_SITE_OTHER): Payer: Medicare Other | Admitting: Gastroenterology

## 2022-05-01 DIAGNOSIS — K224 Dyskinesia of esophagus: Secondary | ICD-10-CM | POA: Diagnosis not present

## 2022-05-01 DIAGNOSIS — R1310 Dysphagia, unspecified: Secondary | ICD-10-CM

## 2022-05-01 NOTE — Progress Notes (Signed)
Robert Vega , MD [address]  [place]  [place], [place] [address]  Main: [phone]  Fax: [phone]   Primary Care Physician: Robert Carbon, MD  Virtual Visit via Video Note  I connected with patient on 05/01/22 at  3:30 PM EST by video and verified that I am speaking with the correct person using two identifiers.   I discussed the limitations, risks, security and privacy concerns of performing an evaluation and management service by video  and the availability of in person appointments. I also discussed with the patient that there may be a patient responsible charge related to this service. The patient expressed understanding and agreed to proceed.  Location of Patient: Home Location of Provider: Home Persons involved: Patient and provider only   History of Present Illness: No chief complaint on file.   HPI: Robert Vega is a 81 y.o. male   Summary of history :  Initially seen in July 22 for dysphagia, has been dilated in the past 18 mm, treated with high-dose PPI and significant esophageal dysmotility was noted.  He did not want to proceed with continuing to use PPIs due to the side effects which she was concerned was of bleeding which I have reassured him in the past that it would not cause, he was placed on Pepcid.  He called and #[id number] for dysphagia and we proceeded with an upper endoscopy on 04/02/2022 and food was found in the upper third of the esophagus we withdrew the scope and following which he threw up a lot of food from the esophagus aspirated had a cardiac arrest had to have CPR performed and subsequently when I performed an upper endoscopy to note some food but not showed no obstruction but there was lack of peristalsis in the esophagus  Interval history    Taking his PPI twice a day keeping the head end of the bed elevated still having issues with swallowing has scheduled an appointment with an esophageal specialist at [facility] and has an  appointment in April 2024.     Current Outpatient Medications  Medication Sig Dispense Refill   acetaminophen (TYLENOL) 650 MG CR tablet Take 650 mg by mouth every morning.     amLODipine (NORVASC) 2.5 MG tablet Take 1 tablet (2.5 mg total) by mouth daily. 90 tablet 3   aspirin 81 MG tablet Take 81 mg by mouth daily.     GLUCOSAMINE-CHONDROITIN-MSM PO Take by mouth.     lansoprazole (PREVACID) 30 MG capsule Take 1 capsule (30 mg total) by mouth daily. At bedtime 30 capsule 11   Multiple Vitamins-Minerals (MULTIVITAMIN WITH MINERALS) tablet Take 1 tablet by mouth daily.     oxyCODONE (ROXICODONE) 5 MG immediate release tablet Take 1 tablet (5 mg total) by mouth every 8 (eight) hours as needed. 20 tablet 0   tadalafil (CIALIS) 20 MG tablet Take 0.5-1 tablets (10-20 mg total) by mouth every other day as needed for erectile dysfunction. 5 tablet 11   No current facility-administered medications for this visit.    Allergies as of 05/01/2022 - Review Complete 04/09/2022  Allergen Reaction Noted   Sulfonamide derivatives  04/17/2007    Review of Systems:    All systems reviewed and negative except where noted in HPI.  General Appearance:    Alert, cooperative, no distress, appears stated age  Head:    Normocephalic, without obvious abnormality, atraumatic  Eyes:    PERRL, conjunctiva/corneas clear,  Ears:    Grossly normal  hearing    Neurologic:  Grossly normal    Observations/Objective:  Labs: CMP     Component Value Date/Time   NA 137 04/03/2022 0313   K 4.0 04/03/2022 0313   CL 106 04/03/2022 0313   CO2 27 04/03/2022 0313   GLUCOSE 115 (H) 04/03/2022 0313   BUN 16 04/03/2022 0313   CREATININE 0.99 04/03/2022 0313   CALCIUM 8.1 (L) 04/03/2022 0313   PROT 7.3 05/02/2021 1029   ALBUMIN 4.2 05/02/2021 1029   AST 23 05/02/2021 1029   ALT 16 05/02/2021 1029   ALKPHOS 70 05/02/2021 1029   BILITOT 0.4 05/02/2021 1029   GFRNONAA >60 04/03/2022 0313   GFRAA 108 04/17/2007 0000    Lab Results  Component Value Date   WBC 14.8 (H) 04/03/2022   HGB 10.7 (L) 04/03/2022   HCT 31.8 (L) 04/03/2022   MCV 95.5 04/03/2022   PLT 222 04/03/2022    Imaging Studies: DG Chest Port 1 View  Result Date: 04/04/2022 CLINICAL DATA:  Aspiration pneumonia EXAM: PORTABLE CHEST 1 VIEW COMPARISON:  08/16/2021 FINDINGS: Mild cardiomegaly. Streaky left perihilar and left basilar airspace opacity. Slightly increased interstitial markings bilaterally. No pleural effusion or pneumothorax. IMPRESSION: Streaky left perihilar and left basilar airspace opacity, which may reflect atelectasis versus pneumonia, including aspiration pneumonia. Electronically Signed   By: Robert Vega D.O.   On: 04/04/2022 15:47    Assessment and Plan:   Robert Vega is a 81 y.o. y/o male with a history of dysphagia longstanding no clear obstruction noted lack of peristalsis seen in the esophagus recent upper endoscopy showed a lot of food debris in the esophagus but none seen in the stomach, he had a respiratory arrest due to aspiration had to have CPR performed and was brought back.  When I reintroduced the scope no blockage was seen.  Differential diagnosis include achalasia versus esophageal dysmotility.   Plan 1.  Keep head end of the bed elevated at all times to 45 degrees have small meals more often rather than large meals at 1 time.  Particularly avoid large meals before bedtime.  2   Would recommend twice daily omeprazole 40 mg to treat any ongoing reflux, already has appointment with [facility] in 08/2022 to see an esophageal specialist   3.  Would recommend esophageal manometry to rule out achalasia.  Any future upper endoscopies or colonoscopies would require intubation.   F/u as needed    I discussed the assessment and treatment plan with the patient. The patient was provided an opportunity to ask questions and all were answered. The patient agreed with the plan and demonstrated an understanding of  the instructions.   The patient was advised to call back or seek an in-person evaluation if the symptoms worsen or if the condition fails to improve as anticipated.  I provided 20 minutes of face-to-face time during this encounter.  Dr Robert Bellows MD,MRCP [facility]) Gastroenterology/Hepatology Pager: [phone]   Speech recognition software was used to dictate this note.

## 2022-05-04 ENCOUNTER — Encounter: Payer: Self-pay | Admitting: Internal Medicine

## 2022-05-04 ENCOUNTER — Ambulatory Visit (INDEPENDENT_AMBULATORY_CARE_PROVIDER_SITE_OTHER): Payer: Medicare Other | Admitting: Internal Medicine

## 2022-05-04 VITALS — BP 136/84 | HR 63 | Temp 97.8°F | Ht 68.5 in | Wt 232.0 lb

## 2022-05-04 DIAGNOSIS — Z0000 Encounter for general adult medical examination without abnormal findings: Secondary | ICD-10-CM | POA: Diagnosis not present

## 2022-05-04 DIAGNOSIS — R1319 Other dysphagia: Secondary | ICD-10-CM | POA: Diagnosis not present

## 2022-05-04 DIAGNOSIS — I10 Essential (primary) hypertension: Secondary | ICD-10-CM

## 2022-05-04 DIAGNOSIS — M159 Polyosteoarthritis, unspecified: Secondary | ICD-10-CM | POA: Diagnosis not present

## 2022-05-04 DIAGNOSIS — F39 Unspecified mood [affective] disorder: Secondary | ICD-10-CM | POA: Diagnosis not present

## 2022-05-04 NOTE — Progress Notes (Signed)
Subjective:    Patient ID: Robert Vega, male    DOB: 01-02-1941, 81 y.o.   MRN: [id number]  HPI Here for Medicare wellness visit and follow up of chronic health conditions Reviewed advanced directives Reviewed other doctors---Dr Anna--GI, Dr Myrlene Broker, Dr Archie Balboa, Dr Lenis Noon, Dr Stevenson Clinch Was briefly hospitalized after aspirating with EGD and needed resuscitation (last month) Still has garden --but not much exercise otherwise Vision okay Hearing is fine No alcohol (or rare wine). No tobacco Fell up steps at church if not careful. No injuries Chronic mild mood issues Independent with instrumental ADLs Mild memory issues-mostly recall  Still having trouble with swallowing Got appointment with [facility] GI doctor whose specializes in esophageal problems Taking the prevacid daily Eating all food--but being very careful Still coughing a lot---especially if sitting bent over Vomits after eating regularly  No chest pain or SOB Non dizziness or syncope No palpitations now  Some frustration and sadness with current issues Some degree of anhedonia--not as interested in golf Still in positive relationship  Various joint aches and pains Uses tylenol every morning---then as needed  Current Outpatient Medications on File Prior to Visit  Medication Sig Dispense Refill   acetaminophen (TYLENOL) 650 MG CR tablet Take 650 mg by mouth every morning.     amLODipine (NORVASC) 2.5 MG tablet Take 1 tablet (2.5 mg total) by mouth daily. 90 tablet 3   aspirin 81 MG tablet Take 81 mg by mouth daily.     GLUCOSAMINE-CHONDROITIN-MSM PO Take by mouth.     lansoprazole (PREVACID) 30 MG capsule Take 1 capsule (30 mg total) by mouth daily. At bedtime 30 capsule 11   Multiple Vitamins-Minerals (MULTIVITAMIN WITH MINERALS) tablet Take 1 tablet by mouth daily.     tadalafil (CIALIS) 20 MG tablet Take 0.5-1 tablets (10-20 mg total) by mouth every other day as needed for erectile  dysfunction. 5 tablet 11   No current facility-administered medications on file prior to visit.    Allergies  Allergen Reactions   Sulfonamide Derivatives     Past Medical History:  Diagnosis Date   Allergy    Arthritis    BPH (benign prostatic hypertrophy)    Colonic polyp    GERD (gastroesophageal reflux disease)    Has LPR   Hypertension     Past Surgical History:  Procedure Laterality Date   CATARACT EXTRACTION     2011, other in 2013   COLONOSCOPY  2011   ESOPHAGOGASTRODUODENOSCOPY (EGD) WITH PROPOFOL N/A 08/30/2020   Procedure: ESOPHAGOGASTRODUODENOSCOPY (EGD) WITH PROPOFOL;  Surgeon: Jonathon Bellows, MD;  Location: [facility] ENDOSCOPY;  Service: Gastroenterology;  Laterality: N/A;   ESOPHAGOGASTRODUODENOSCOPY (EGD) WITH PROPOFOL N/A 04/02/2022   Procedure: ESOPHAGOGASTRODUODENOSCOPY (EGD) WITH PROPOFOL;  Surgeon: Jonathon Bellows, MD;  Location: [facility] ENDOSCOPY;  Service: Gastroenterology;  Laterality: N/A;   skin cancer removal      Family History  Problem Relation Age of Onset   Hypertension Mother    Heart disease Neg Hx    Diabetes Neg Hx    Cancer Neg Hx     Social History   Socioeconomic History   Marital status: Divorced    Spouse name: Not on file   Number of children: 2   Years of education: Not on file   Highest education level: Not on file  Occupational History   Occupation: retired--car dealer/sales  Tobacco Use   Smoking status: Former    Packs/day: 2.00    Years: 50.00    Total pack years: 100.00  Types: Pipe, Cigarettes    Quit date: 10/20/2014    Years since quitting: 7.5    Passive exposure: Past   Smokeless tobacco: Never   Tobacco comments:    pipe   Vaping Use   Vaping Use: Never used  Substance and Sexual Activity   Alcohol use: No    Alcohol/week: 0.0 standard drinks of alcohol   Drug use: No   Sexual activity: Not on file  Other Topics Concern   Not on file  Social History Narrative   Has living will   Two sons and granddaughter  are health care POAs (GD is first ---is DPT)   Would accept resuscitation and tube feedings if appropriate   Social Determinants of Health   Financial Resource Strain: Not on file  Food Insecurity: No Food Insecurity (04/02/2022)   Hunger Vital Sign    Worried About Running Out of Food in the Last Year: Never true    Ran Out of Food in the Last Year: Never true  Transportation Needs: No Transportation Needs (04/02/2022)   PRAPARE - Hydrologist (Medical): No    Lack of Transportation (Non-Medical): No  Physical Activity: Not on file  Stress: Not on file  Social Connections: Not on file  Intimate Partner Violence: Not At Risk (04/02/2022)   Humiliation, Afraid, Rape, and Kick questionnaire    Fear of Current or Ex-Partner: No    Emotionally Abused: No    Physically Abused: No    Sexually Abused: No   Review of Systems Eating okay Has lost a few pounds this year Sleeps okay--affected by cough and will often use recliner (and cough drops) Wears seat belt Only a few teeth yet--sees the dentist Going to derm next week----nothing suspicious Bowels are okay---some blood with large stools Voids okay---has to sit. Does empty okay eventually. Nocturia is not every night    Objective:   Physical Exam Constitutional:      Appearance: Normal appearance.  Eyes:     Conjunctiva/sclera: Conjunctivae normal.     Pupils: Pupils are equal, round, and reactive to light.  Cardiovascular:     Rate and Rhythm: Normal rate and regular rhythm.     Pulses: Normal pulses.     Heart sounds: No murmur heard.    No gallop.  Pulmonary:     Effort: Pulmonary effort is normal.     Breath sounds: Normal breath sounds. No wheezing or rales.  Abdominal:     Palpations: Abdomen is soft.     Tenderness: There is no abdominal tenderness.  Musculoskeletal:     Cervical back: Neck supple.     Right lower leg: No edema.     Left lower leg: No edema.  Lymphadenopathy:      Cervical: No cervical adenopathy.  Skin:    Findings: No lesion or rash.  Neurological:     General: No focal deficit present.     Mental Status: He is alert and oriented to person, place, and time.     Comments: Mini-cog okay----clock good, recall 2/3  Psychiatric:        Mood and Affect: Mood normal.        Behavior: Behavior normal.            Assessment & Plan:

## 2022-05-04 NOTE — Assessment & Plan Note (Signed)
Does okay with tylenol 650 once or twice

## 2022-05-04 NOTE — Progress Notes (Signed)
Hearing Screening - Comments:: Passed whisper test Vision Screening - Comments:: October 2023

## 2022-05-04 NOTE — Assessment & Plan Note (Signed)
I have personally reviewed the Medicare Annual Wellness questionnaire and have noted 1. The patient's medical and social history 2. Their use of alcohol, tobacco or illicit drugs 3. Their current medications and supplements 4. The patient's functional ability including ADL's, fall risks, home safety risks and hearing or visual             impairment. 5. Diet and physical activities 6. Evidence for depression or mood disorders  The patients weight, height, BMI and visual acuity have been recorded in the chart I have made referrals, counseling and provided education to the patient based review of the above and I have provided the pt with a written personalized care plan for preventive services.  I have provided you with a copy of your personalized plan for preventive services. Please take the time to review along with your updated medication list.  Done with cancer screening Discussed maintaining physical activity year round Had flu vaccine Recommended updated COVID Td and shingrix at pharmacy

## 2022-05-04 NOTE — Assessment & Plan Note (Signed)
Mild dysthymia and situational stress with medical issues Medications not appropriate

## 2022-05-04 NOTE — Assessment & Plan Note (Signed)
Ongoing esophageal problems Waiting to see specialist at [facility] Continues on prevacid '30mg'$  daily

## 2022-05-04 NOTE — Assessment & Plan Note (Signed)
BP Readings from Last 3 Encounters:  05/04/22 136/84  04/09/22 110/78  04/03/22 (!) 120/92   Does fine with the amlodipine 2.'5mg'$  daily

## 2022-05-07 DIAGNOSIS — C44719 Basal cell carcinoma of skin of left lower limb, including hip: Secondary | ICD-10-CM | POA: Diagnosis not present

## 2022-05-07 DIAGNOSIS — Z08 Encounter for follow-up examination after completed treatment for malignant neoplasm: Secondary | ICD-10-CM | POA: Diagnosis not present

## 2022-05-07 DIAGNOSIS — L570 Actinic keratosis: Secondary | ICD-10-CM | POA: Diagnosis not present

## 2022-05-07 DIAGNOSIS — X32XXXA Exposure to sunlight, initial encounter: Secondary | ICD-10-CM | POA: Diagnosis not present

## 2022-05-07 DIAGNOSIS — L233 Allergic contact dermatitis due to drugs in contact with skin: Secondary | ICD-10-CM | POA: Diagnosis not present

## 2022-05-07 DIAGNOSIS — Z85820 Personal history of malignant melanoma of skin: Secondary | ICD-10-CM | POA: Diagnosis not present

## 2022-05-07 DIAGNOSIS — D485 Neoplasm of uncertain behavior of skin: Secondary | ICD-10-CM | POA: Diagnosis not present

## 2022-05-07 DIAGNOSIS — Z85828 Personal history of other malignant neoplasm of skin: Secondary | ICD-10-CM | POA: Diagnosis not present

## 2022-05-07 DIAGNOSIS — L820 Inflamed seborrheic keratosis: Secondary | ICD-10-CM | POA: Diagnosis not present

## 2022-06-04 ENCOUNTER — Telehealth: Payer: Self-pay | Admitting: Internal Medicine

## 2022-06-04 NOTE — Telephone Encounter (Signed)
Patient is requesting a call form [place],regarding an appointment that he was scheduled or at [facility].

## 2022-06-04 NOTE — Telephone Encounter (Signed)
Pt said he received a text for a visit at [facility]. He does not know for what. I am not showing an appt at [facility].

## 2022-07-03 DIAGNOSIS — C44719 Basal cell carcinoma of skin of left lower limb, including hip: Secondary | ICD-10-CM | POA: Diagnosis not present

## 2022-07-25 ENCOUNTER — Telehealth: Payer: Self-pay | Admitting: Internal Medicine

## 2022-07-25 NOTE — Telephone Encounter (Signed)
Spoke to pt. They put him on the cancellation list.

## 2022-07-25 NOTE — Telephone Encounter (Signed)
Pt called stating he's having some throat issues & the provider he's suppose to see at [facility], cannot see the pt until Apr 2024. Pt is asking is there anything can do to get him seen somewhere sooner? Pt is requesting a call back from [place] to discuss the issue. Call back # [phone]

## 2022-09-05 ENCOUNTER — Other Ambulatory Visit: Payer: Self-pay | Admitting: Internal Medicine

## 2022-09-17 DIAGNOSIS — R1319 Other dysphagia: Secondary | ICD-10-CM | POA: Diagnosis not present

## 2022-09-18 ENCOUNTER — Other Ambulatory Visit: Payer: Self-pay | Admitting: Student

## 2022-09-18 DIAGNOSIS — R1310 Dysphagia, unspecified: Secondary | ICD-10-CM

## 2022-09-20 ENCOUNTER — Other Ambulatory Visit: Payer: Self-pay | Admitting: Internal Medicine

## 2022-09-26 ENCOUNTER — Ambulatory Visit
Admission: RE | Admit: 2022-09-26 | Discharge: 2022-09-26 | Disposition: A | Discharge status: Home / Self Care | Payer: Medicare PPO | Source: Ambulatory Visit | Attending: Student | Admitting: Student

## 2022-09-26 ENCOUNTER — Other Ambulatory Visit: Payer: Self-pay | Admitting: Student

## 2022-09-26 DIAGNOSIS — R1310 Dysphagia, unspecified: Secondary | ICD-10-CM | POA: Diagnosis not present

## 2022-09-26 DIAGNOSIS — K220 Achalasia of cardia: Secondary | ICD-10-CM | POA: Diagnosis not present

## 2022-10-19 DIAGNOSIS — R079 Chest pain, unspecified: Secondary | ICD-10-CM | POA: Diagnosis not present

## 2022-10-19 DIAGNOSIS — R1310 Dysphagia, unspecified: Secondary | ICD-10-CM | POA: Diagnosis not present

## 2022-10-19 DIAGNOSIS — K222 Esophageal obstruction: Secondary | ICD-10-CM | POA: Diagnosis not present

## 2022-10-19 DIAGNOSIS — K2289 Other specified disease of esophagus: Secondary | ICD-10-CM | POA: Diagnosis not present

## 2023-01-14 DIAGNOSIS — R1319 Other dysphagia: Secondary | ICD-10-CM | POA: Diagnosis not present

## 2023-02-25 ENCOUNTER — Other Ambulatory Visit: Payer: Self-pay | Admitting: Internal Medicine

## 2023-03-19 DIAGNOSIS — D2262 Melanocytic nevi of left upper limb, including shoulder: Secondary | ICD-10-CM | POA: Diagnosis not present

## 2023-03-19 DIAGNOSIS — L821 Other seborrheic keratosis: Secondary | ICD-10-CM | POA: Diagnosis not present

## 2023-03-19 DIAGNOSIS — D2271 Melanocytic nevi of right lower limb, including hip: Secondary | ICD-10-CM | POA: Diagnosis not present

## 2023-03-19 DIAGNOSIS — Z08 Encounter for follow-up examination after completed treatment for malignant neoplasm: Secondary | ICD-10-CM | POA: Diagnosis not present

## 2023-03-19 DIAGNOSIS — L300 Nummular dermatitis: Secondary | ICD-10-CM | POA: Diagnosis not present

## 2023-03-19 DIAGNOSIS — Z85820 Personal history of malignant melanoma of skin: Secondary | ICD-10-CM | POA: Diagnosis not present

## 2023-03-19 DIAGNOSIS — D2261 Melanocytic nevi of right upper limb, including shoulder: Secondary | ICD-10-CM | POA: Diagnosis not present

## 2023-03-19 DIAGNOSIS — D225 Melanocytic nevi of trunk: Secondary | ICD-10-CM | POA: Diagnosis not present

## 2023-03-19 DIAGNOSIS — D2272 Melanocytic nevi of left lower limb, including hip: Secondary | ICD-10-CM | POA: Diagnosis not present

## 2023-03-28 DIAGNOSIS — H353131 Nonexudative age-related macular degeneration, bilateral, early dry stage: Secondary | ICD-10-CM | POA: Diagnosis not present

## 2023-03-28 DIAGNOSIS — Z0100 Encounter for examination of eyes and vision without abnormal findings: Secondary | ICD-10-CM | POA: Diagnosis not present

## 2023-03-28 DIAGNOSIS — H43813 Vitreous degeneration, bilateral: Secondary | ICD-10-CM | POA: Diagnosis not present

## 2023-03-28 DIAGNOSIS — H35372 Puckering of macula, left eye: Secondary | ICD-10-CM | POA: Diagnosis not present

## 2023-03-28 DIAGNOSIS — Z961 Presence of intraocular lens: Secondary | ICD-10-CM | POA: Diagnosis not present

## 2023-05-07 ENCOUNTER — Encounter: Payer: Self-pay | Admitting: Internal Medicine

## 2023-05-07 ENCOUNTER — Ambulatory Visit: Payer: Medicare PPO | Admitting: Internal Medicine

## 2023-05-07 VITALS — BP 122/82 | HR 67 | Temp 98.4°F | Ht 69.0 in | Wt 239.0 lb

## 2023-05-07 DIAGNOSIS — Z0000 Encounter for general adult medical examination without abnormal findings: Secondary | ICD-10-CM

## 2023-05-07 DIAGNOSIS — F39 Unspecified mood [affective] disorder: Secondary | ICD-10-CM | POA: Diagnosis not present

## 2023-05-07 DIAGNOSIS — G479 Sleep disorder, unspecified: Secondary | ICD-10-CM

## 2023-05-07 DIAGNOSIS — I10 Essential (primary) hypertension: Secondary | ICD-10-CM | POA: Diagnosis not present

## 2023-05-07 DIAGNOSIS — R1319 Other dysphagia: Secondary | ICD-10-CM | POA: Diagnosis not present

## 2023-05-07 LAB — COMPREHENSIVE METABOLIC PANEL
ALT: 13 U/L (ref 0–53)
AST: 20 U/L (ref 0–37)
Albumin: 4.1 g/dL (ref 3.5–5.2)
Alkaline Phosphatase: 90 U/L (ref 39–117)
BUN: 20 mg/dL (ref 6–23)
CO2: 33 meq/L — ABNORMAL HIGH (ref 19–32)
Calcium: 8.7 mg/dL (ref 8.4–10.5)
Chloride: 99 meq/L (ref 96–112)
Creatinine, Ser: 0.92 mg/dL (ref 0.40–1.50)
GFR: 76.57 mL/min (ref >60.00)
Glucose, Bld: 91 mg/dL (ref 70–99)
Potassium: 4.6 meq/L (ref 3.5–5.1)
Sodium: 137 meq/L (ref 135–145)
Total Bilirubin: 0.3 mg/dL (ref 0.2–1.2)
Total Protein: 7.4 g/dL (ref 6.0–8.3)

## 2023-05-07 LAB — CBC
HCT: 37.1 % — ABNORMAL LOW (ref 39.0–52.0)
Hemoglobin: 12.4 g/dL — ABNORMAL LOW (ref 13.0–17.0)
MCHC: 33.4 g/dL (ref 30.0–36.0)
MCV: 98.3 fL (ref 78.0–100.0)
Platelets: 295 10*3/uL (ref 150.0–400.0)
RBC: 3.77 Mil/uL — ABNORMAL LOW (ref 4.22–5.81)
RDW: 13.2 % (ref 11.5–15.5)
WBC: 6.8 10*3/uL (ref 4.0–10.5)

## 2023-05-07 NOTE — Assessment & Plan Note (Signed)
I have personally reviewed the Medicare Annual Wellness questionnaire and have noted 1. The patient's medical and social history 2. Their use of alcohol, tobacco or illicit drugs 3. Their current medications and supplements 4. The patient's functional ability including ADL's, fall risks, home safety risks and hearing or visual             impairment. 5. Diet and physical activities 6. Evidence for depression or mood disorders  The patients weight, height, BMI and visual acuity have been recorded in the chart I have made referrals, counseling and provided education to the patient based review of the above and I have provided the pt with a written personalized care plan for preventive services.  I have provided you with a copy of your personalized plan for preventive services. Please take the time to review along with your updated medication list.  Done with cancer screening Discussed exercise Had flu and COVID vaccines Needs RSV and tetanus update Consider shingrix

## 2023-05-07 NOTE — Progress Notes (Signed)
Subjective:    Patient ID: Robert Vega, male    DOB: [birth year], 82 y.o.   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 it up) Vision is okay Hearing is fine No falls Independent with instrumental ADLs Notes some memory issues--mostly just name recall  Having a lot of trouble sleeping Started with esophageal problems No set wake up time--discussed sleep hygiene Doesn't feel depressed Enjoys things--still in positive relationship  Did go to [facility] about the dysphagia Continues with them--but no new procedures Gets cough and occasional vomiting from this Has to be very careful with chewing, etc  Current Outpatient Medications on File Prior to Visit  Medication Sig Dispense Refill   acetaminophen (TYLENOL) 650 MG CR tablet Take 650 mg by mouth every morning.     aluminum hydroxide-magnesium carbonate (GAVISCON) 95-358 MG/15ML SUSP Take by mouth.     amLODipine (NORVASC) 2.5 MG tablet TAKE ONE TABLET BY MOUTH DAILY 90 tablet 3   aspirin 81 MG tablet Take 81 mg by mouth daily.     GLUCOSAMINE-CHONDROITIN-MSM PO Take by mouth.     lansoprazole (PREVACID) 30 MG capsule TAKE ONE CAPSULE BY MOUTH EVERY NIGHT AT BEDTIME 90 capsule 2   Multiple Vitamins-Minerals (MULTIVITAMIN WITH MINERALS) tablet Take 1 tablet by mouth daily.     tadalafil (CIALIS) 20 MG tablet TAKE 1/2 TO 1 TABLET BY MOUTH EVERY OTHER DAY AS NEEDED FOR ERECTILE DYSFUNCTION 5 tablet 11   No current facility-administered medications on file prior to visit.    Allergies  Allergen Reactions   Sulfonamide Derivatives     Past Medical History:  Diagnosis Date    Allergy    Arthritis    BPH (benign prostatic hypertrophy)    Colonic polyp    GERD (gastroesophageal reflux disease)    Has LPR   Hypertension     Past Surgical History:  Procedure Laterality Date   CATARACT EXTRACTION     2011, other in 2013   COLONOSCOPY  2011   ESOPHAGOGASTRODUODENOSCOPY (EGD) WITH PROPOFOL N/A 08/30/2020   Procedure: ESOPHAGOGASTRODUODENOSCOPY (EGD) WITH PROPOFOL;  Surgeon: Wyline Mood, MD;  Location: [facility] ENDOSCOPY;  Service: Gastroenterology;  Laterality: N/A;   ESOPHAGOGASTRODUODENOSCOPY (EGD) WITH PROPOFOL N/A 04/02/2022   Procedure: ESOPHAGOGASTRODUODENOSCOPY (EGD) WITH PROPOFOL;  Surgeon: Wyline Mood, MD;  Location: [facility] ENDOSCOPY;  Service: Gastroenterology;  Laterality: N/A;   skin cancer removal      Family History  Problem Relation Age of Onset   Hypertension Mother    Heart disease Neg Hx    Diabetes Neg Hx    Cancer Neg Hx     Social History   Socioeconomic History   Marital status: Divorced    Spouse name: Not on file   Number of children: 2   Years of education: Not on file   Highest education level: Not on file  Occupational History   Occupation: retired--car dealer/sales  Tobacco Use   Smoking status: Former    Current packs/day: 0.00    Average packs/day: 2.0 packs/day for 50.0 years (100.0 ttl pk-yrs)    Types: Pipe, Cigarettes    Start date: 10/19/1964    Quit date: 10/20/2014    Years since quitting: 8.5  Passive exposure: Past   Smokeless tobacco: Never   Tobacco comments:    pipe   Vaping Use   Vaping status: Never Used  Substance and Sexual Activity   Alcohol use: No    Alcohol/week: 0.0 standard drinks of alcohol   Drug use: No   Sexual activity: Not on file  Other Topics Concern   Not on file  Social History Narrative   Has living will   Two sons and granddaughter are health care POAs (GD is first ---is DPT)   Would accept resuscitation and tube feedings if appropriate   Social Determinants of Health    Financial Resource Strain: Not on file  Food Insecurity: No Food Insecurity (04/02/2022)   Hunger Vital Sign    Worried About Running Out of Food in the Last Year: Never true    Ran Out of Food in the Last Year: Never true  Transportation Needs: No Transportation Needs (04/02/2022)   PRAPARE - Administrator, Civil Service (Medical): No    Lack of Transportation (Non-Medical): No  Physical Activity: Not on file  Stress: Not on file  Social Connections: Not on file  Intimate Partner Violence: Not At Risk (04/02/2022)   Humiliation, Afraid, Rape, and Kick questionnaire    Fear of Current or Ex-Partner: No    Emotionally Abused: No    Physically Abused: No    Sexually Abused: No   Review of Systems Appetite is okay Weight is stable Wears seat belt Teeth are pretty good--only a few left Bowels move slow---using OTC med to help--no blood (except rare on toilet paper) Voids okay. Empties fairly well in general  No chest pain or SOB No dizziness or syncope Some ankle edema---gone in AM No sig back or joint pains No suspicious skin lesions--but does have itching    Objective:   Physical Exam Constitutional:      Appearance: Normal appearance.  HENT:     Mouth/Throat:     Pharynx: No oropharyngeal exudate or posterior oropharyngeal erythema.  Eyes:     Conjunctiva/sclera: Conjunctivae normal.     Pupils: Pupils are equal, round, and reactive to light.  Cardiovascular:     Rate and Rhythm: Normal rate and regular rhythm.     Pulses: Normal pulses.     Heart sounds: No murmur heard.    No gallop.  Pulmonary:     Effort: Pulmonary effort is normal.     Breath sounds: Normal breath sounds. No wheezing or rales.  Abdominal:     Palpations: Abdomen is soft.     Tenderness: There is no abdominal tenderness.  Musculoskeletal:     Cervical back: Neck supple.     Right lower leg: No edema.     Left lower leg: No edema.  Lymphadenopathy:     Cervical: No cervical  adenopathy.  Skin:    Findings: No lesion or rash.  Neurological:     General: No focal deficit present.     Mental Status: He is alert and oriented to person, place, and time.     Comments: Word naming 6/1 minute Recall --- 1/3  Psychiatric:        Mood and Affect: Mood normal.        Behavior: Behavior normal.            Assessment & Plan:

## 2023-05-07 NOTE — Assessment & Plan Note (Signed)
Ongoing symptoms Now going to [facility] on the prevacid 30mg  dialy

## 2023-05-07 NOTE — Progress Notes (Signed)
Hearing Screening - Comments:: Passed whisper test Vision Screening - Comments:: October 2024

## 2023-05-07 NOTE — Assessment & Plan Note (Signed)
BP Readings from Last 3 Encounters:  05/07/23 122/82  05/04/22 136/84  04/09/22 110/78   Controlled with amlodipine 2.5mg  daily Will check labs

## 2023-05-07 NOTE — Assessment & Plan Note (Signed)
Has had some improvement in anhedonia Still frustrated by chronic swallowing issues No MDD--no meds indicated

## 2023-05-07 NOTE — Patient Instructions (Addendum)
Please start a regular wake up time--and go to bed 6-8 hours before that. You can try melatonin--start with 3mg  but you can go as high as 10mg  (1-2 hours before your bedtime) Please get a tetanus booster and RSV vaccine at pharmacy

## 2023-05-07 NOTE — Assessment & Plan Note (Signed)
Discussed sleep hygiene Try melatonin Consider trazodone if persists

## 2023-05-13 DIAGNOSIS — R059 Cough, unspecified: Secondary | ICD-10-CM | POA: Diagnosis not present

## 2023-05-13 DIAGNOSIS — Z79899 Other long term (current) drug therapy: Secondary | ICD-10-CM | POA: Diagnosis not present

## 2023-05-13 DIAGNOSIS — I10 Essential (primary) hypertension: Secondary | ICD-10-CM | POA: Diagnosis not present

## 2023-05-13 DIAGNOSIS — R1310 Dysphagia, unspecified: Secondary | ICD-10-CM | POA: Diagnosis not present

## 2023-05-13 DIAGNOSIS — R053 Chronic cough: Secondary | ICD-10-CM | POA: Diagnosis not present

## 2023-05-13 DIAGNOSIS — K219 Gastro-esophageal reflux disease without esophagitis: Secondary | ICD-10-CM | POA: Diagnosis not present

## 2023-05-13 DIAGNOSIS — R1319 Other dysphagia: Secondary | ICD-10-CM | POA: Diagnosis not present

## 2023-05-14 ENCOUNTER — Other Ambulatory Visit: Payer: Self-pay | Admitting: Student

## 2023-05-14 DIAGNOSIS — R1319 Other dysphagia: Secondary | ICD-10-CM

## 2023-06-05 ENCOUNTER — Ambulatory Visit
Admission: RE | Admit: 2023-06-05 | Discharge: 2023-06-05 | Disposition: A | Discharge status: Home / Self Care | Payer: Medicare PPO | Source: Ambulatory Visit | Attending: Student | Admitting: Student

## 2023-06-05 DIAGNOSIS — R1319 Other dysphagia: Secondary | ICD-10-CM | POA: Diagnosis not present

## 2023-06-05 DIAGNOSIS — K219 Gastro-esophageal reflux disease without esophagitis: Secondary | ICD-10-CM | POA: Diagnosis not present

## 2023-06-05 DIAGNOSIS — R1310 Dysphagia, unspecified: Secondary | ICD-10-CM | POA: Diagnosis not present

## 2023-06-05 DIAGNOSIS — R059 Cough, unspecified: Secondary | ICD-10-CM | POA: Diagnosis not present

## 2023-06-05 NOTE — Progress Notes (Addendum)
 Modified Barium Swallow Study  Patient Details  Name: Robert Vega MRN: [id number] Date of Birth: [birth year]  Today's Date: 06/05/2023  Modified Barium Swallow completed.  Full report located under Chart Review in the Imaging Section.  History of Present Illness Pt is an 83 y.o. year old male with PMH significant for Extensive Esophageal phase Dysmotility per GI chart notes and f/u (12/2022-04/2023); also for GERD, BPH, Obesity, h/o melanoma, htn.  Per further chart notes: Last seen in 4/24. EGD 03/2022 with food in upper esophagus complicated by aspiration event, cardiac arrest with round of CPR, and post resuscitation EGD with concern for lack of peristalsis. Prior EGD 08/2020 with note of abnormal cricopharyngeus, decrease in motility in esophagus, and spastic LES. Dilated to 18mm without change. Concern for ineffective esophageal motility. Plan at that time was MBBS ( locally), barium swallow, mano, dental eval. Mano notable for IEM with EGJOO and Barium showed slowed barium transit.  EGD 03/2022 with food in upper esophagus complicated by aspiration event, cardiac arrest with round of CPR, and post resuscitation EGD with concern for lack of peristalsis. Prior EGD 08/2020 with note of abnormal cricopharyngeus, decrease in motility in esophagus, and spastic LES.SABRA  Also reported: patient notes that when he lays down, he will in most cases start coughing and this can progress to emesis. Coughing can happen during the day as well but most pronounced when he lays down. He notes no issues with coughing when he sleeps in a recliner. We discussed transitioning to sleeping in his recliner as that is likely allowing gravity to clear any esophageal contents..    Pt has NOT had any dx'd pneumonia per his report/chart in recent 6-12 months. No Weight loss; BMI 35.96, 240 lbs.   Pt stated he eats a Regular diet including meats, breads which he has been cautioned to avoid if trigger foods for him, by GI and  Dietician who he follows through [facility] (much education and f/u has been provided by both services in recent 1-2 years per chart notes).    Clinical Impression Patient presents with functional oropharyngeal swallowing for age. No laryngeal penetration nor aspiration noted during this study. Patient had No c/o Esophageal phase Dysmotility during this study w/ trials given.   Oral stage is characterized by adequate lip closure, bolus preparation, mastication, and containment, and anterior to posterior transit. Oral clearing is complete. Swallow initiation occurs at the level of the valleculae which is adequate for age.  Pharyngeal stage is noted for adequate tongue base retraction, adequate hyolaryngeal excursion, and adequate pharyngeal constriction. Epiglottic deflection is complete; there was No penetration nor aspiration noted. No pharyngeal residue remained post swallow. Pharyngeal stripping wave is complete.  Amplitude/duration of cricopharyngeus opening is WFL. There was adequate/complete clearance of all trial consistencies through the UES/cervical esophagus viewable to the shoulders. The barium tablet was not given d/t pt's Denial of any issue swallowing pills. An esophageal sweep was not completed as pt is being actively followed by GI for Esophageal phase Dysmotility management/tx.   Consistencies tested were thin liquids x2 tsps, 1 cup sip, 3 sequential sips, nectar x1 tsp, 1 cup sip, 2 sequential cup sips, honey x1 tsp, pudding x1 tsp, regular solid (1/2 graham cracker with pudding).  Recommend patient continue w/ his diet/consistency as recommended by GI/Dieticain avoiding problematic foods and/or modifying problematic foods such as tough meats, dense breads. Educated pt verbally on the above including moistening/chopping meats, alternating solids and liquids, Small bites/sips Slowly allowing time to clear(esophagus). No  further ST services indicated. Factors that may increase risk of adverse  event in presence of aspiration Noe & Lianne 2021): GI disease (Esophageal phase Dysmotility)  Swallow Evaluation Recommendations Recommendations: PO diet PO Diet Recommendation: Regular (chopped/moistened meats, foods - avoid problematic foods as recommended by his GI) Liquid Administration via: Cup - less straw use d/t air swallowed Medication Administration: Whole meds with liquid (vs whole in puree if needed - pt declined need) Supervision: Patient able to self-feed Swallowing strategies  : Minimize environmental distractions;Slow rate;Small bites/sips;Follow solids with liquids Postural changes: Position pt fully upright for meals;Stay upright 30-60 min after meals;Out of bed for meals (GERD precautions) Oral care recommendations: Oral care BID (2x/day);Pt independent with oral care Recommended consults: Consider GI consultation;Consider esophageal assessment;Consider dietitian consultation ((ongoing))        Comer Portugal, MS, CCC-SLP Speech Language Pathologist Rehab Services; [facility] [phone] (ascom) Aeden Matranga 06/05/2023,3:30 PM

## 2023-07-29 DIAGNOSIS — I2510 Atherosclerotic heart disease of native coronary artery without angina pectoris: Secondary | ICD-10-CM | POA: Diagnosis not present

## 2023-07-29 DIAGNOSIS — I10 Essential (primary) hypertension: Secondary | ICD-10-CM | POA: Diagnosis not present

## 2023-07-29 DIAGNOSIS — R600 Localized edema: Secondary | ICD-10-CM | POA: Diagnosis not present

## 2023-07-29 DIAGNOSIS — R079 Chest pain, unspecified: Secondary | ICD-10-CM | POA: Diagnosis not present

## 2023-07-29 DIAGNOSIS — K219 Gastro-esophageal reflux disease without esophagitis: Secondary | ICD-10-CM | POA: Diagnosis not present

## 2023-07-29 DIAGNOSIS — Z6835 Body mass index (BMI) 35.0-35.9, adult: Secondary | ICD-10-CM | POA: Diagnosis not present

## 2023-07-29 DIAGNOSIS — R0602 Shortness of breath: Secondary | ICD-10-CM | POA: Diagnosis not present

## 2023-07-29 DIAGNOSIS — E66812 Obesity, class 2: Secondary | ICD-10-CM | POA: Diagnosis not present

## 2023-07-29 DIAGNOSIS — G4730 Sleep apnea, unspecified: Secondary | ICD-10-CM | POA: Diagnosis not present

## 2023-07-31 ENCOUNTER — Other Ambulatory Visit: Payer: Self-pay | Admitting: Internal Medicine

## 2023-07-31 DIAGNOSIS — I10 Essential (primary) hypertension: Secondary | ICD-10-CM

## 2023-07-31 DIAGNOSIS — R079 Chest pain, unspecified: Secondary | ICD-10-CM

## 2023-08-06 ENCOUNTER — Telehealth (HOSPITAL_COMMUNITY): Payer: Self-pay | Admitting: *Deleted

## 2023-08-06 NOTE — Telephone Encounter (Signed)
 Reaching out to patient to offer assistance regarding upcoming cardiac imaging study; pt verbalizes understanding of appt date/time, parking situation and where to check in, pre-test NPO status and medications ordered, and verified current allergies; name and call back number provided for further questions should they arise  Larey Brick RN Navigator Cardiac Imaging [facility] [phone] office [phone] cell  Patient to take his daily cardizem and is aware to avoid his cialis.

## 2023-08-08 ENCOUNTER — Ambulatory Visit
Admission: RE | Admit: 2023-08-08 | Discharge: 2023-08-08 | Disposition: A | Discharge status: Home / Self Care | Source: Ambulatory Visit | Attending: Internal Medicine | Admitting: Internal Medicine

## 2023-08-08 ENCOUNTER — Other Ambulatory Visit: Payer: Self-pay | Admitting: Internal Medicine

## 2023-08-08 DIAGNOSIS — R931 Abnormal findings on diagnostic imaging of heart and coronary circulation: Secondary | ICD-10-CM | POA: Insufficient documentation

## 2023-08-08 DIAGNOSIS — I10 Essential (primary) hypertension: Secondary | ICD-10-CM | POA: Diagnosis not present

## 2023-08-08 DIAGNOSIS — R079 Chest pain, unspecified: Secondary | ICD-10-CM | POA: Diagnosis not present

## 2023-08-08 DIAGNOSIS — I2510 Atherosclerotic heart disease of native coronary artery without angina pectoris: Secondary | ICD-10-CM | POA: Insufficient documentation

## 2023-08-08 MED ORDER — DILTIAZEM HCL 25 MG/5ML IV SOLN: 10.0000 mg | INTRAVENOUS | Status: DC | PRN

## 2023-08-08 MED ORDER — SODIUM CHLORIDE 0.9 % IV BOLUS
150.0000 mL | Freq: Once | INTRAVENOUS | Status: AC
  Administered 2023-08-08: 150 mL via INTRAVENOUS

## 2023-08-08 MED ORDER — NITROGLYCERIN 0.4 MG SL SUBL
0.8000 mg | SUBLINGUAL_TABLET | Freq: Once | SUBLINGUAL | Status: AC
  Administered 2023-08-08: 0.8 mg via SUBLINGUAL

## 2023-08-08 MED ORDER — METOPROLOL TARTRATE 5 MG/5ML IV SOLN
10.0000 mg | INTRAVENOUS | Status: DC | PRN
  Administered 2023-08-08: 10 mg via INTRAVENOUS

## 2023-08-08 MED ORDER — IOHEXOL 350 MG/ML SOLN
100.0000 mL | Freq: Once | INTRAVENOUS | Status: AC | PRN
  Administered 2023-08-08: 100 mL via INTRAVENOUS

## 2023-08-08 NOTE — Progress Notes (Signed)
 Patient tolerated CT well. Drank water after. Vital signs stable encourage to drink water throughout day.Reasons explained and verbalized understanding. Ambulated steady gait.

## 2023-08-12 ENCOUNTER — Ambulatory Visit: Admission: RE | Admit: 2023-08-12 | Source: Ambulatory Visit

## 2023-08-20 ENCOUNTER — Ambulatory Visit: Payer: Self-pay | Admitting: *Deleted

## 2023-08-20 NOTE — Telephone Encounter (Signed)
 Copied from CRM [id number]. Topic: Clinical - Red Word Triage >> Aug 20, 2023  9:42 AM Denese Killings wrote: Red Word that prompted transfer to Nurse Triage: Patient fell yesterday coming up stairs and he hit his right leg. Reason for Disposition  [1] High-risk adult (e.g., age > 60 years, osteoporosis, chronic steroid use) AND [2] limping  Answer Assessment - Initial Assessment Questions 1. MECHANISM: "How did the injury happen?" (e.g., twisting injury, direct blow)      I fell while going up some steps yesterday.   I hit it in the middle of my leg in my thigh area.  I was going up steps.  No bruises or cuts.    My right and left foot building up fluid in both legs from the knee down.    2. ONSET: "When did the injury happen?" (Minutes or hours ago)      Yesterday 3. LOCATION: "Where is the injury located?"      I hit my thigh area when I fell going up the steps. 4. APPEARANCE of INJURY: "What does the injury look like?"  (e.g., deformity of leg)     There is not a bruise or cuts there. 5. SEVERITY: "Can you put weight on that leg?" "Can you walk?"      I can walk 6. SIZE: For cuts, bruises, or swelling, ask: "How large is it?" (e.g., inches or centimeters)      No cuts or bruises 7. PAIN: "Is there pain?" If Yes, ask: "How bad is the pain?"   "What does it keep you from doing?" (e.g., Scale 1-10; or mild, moderate, severe)   -  NONE: (0): no pain   -  MILD (1-3): doesn't interfere with normal activities    -  MODERATE (4-7): interferes with normal activities (e.g., work or school) or awakens from sleep, limping    -  SEVERE (8-10): excruciating pain, unable to do any normal activities, unable to walk     Yes 8. TETANUS: For any breaks in the skin, ask: "When was the last tetanus booster?"     Not asked 9. OTHER SYMPTOMS: "Do you have any other symptoms?"      Both legs are swollen with fluid from knee down. 10. PREGNANCY: "Is there any chance you are pregnant?" "When was your last menstrual  period?"       N/A  Protocols used: Leg Injury-A-AH  Chief Complaint: Bilateral leg swelling from knees down.   Also fell going up some steps Monday and hit his thigh area. Symptoms: No bruises or cuts when he fell.   Frequency: Happened yesterday Pertinent Negatives: Patient denies shortness of breath or other injuries from the fall.   Disposition: [] ED /[] Urgent Care (no appt availability in office) / [x] Appointment(In office/virtual)/ []  [facility]/ [] Home Care/ [] Refused Recommended Disposition /[]  Mobile Bus/ []  Follow-up with PCP Additional Notes: Appt made for 08/21/2023 with Dr. Alphonsus Sias.

## 2023-08-20 NOTE — Telephone Encounter (Signed)
Okay--I will check him at the visit tomorrow

## 2023-08-21 ENCOUNTER — Ambulatory Visit: Admitting: Internal Medicine

## 2023-08-21 ENCOUNTER — Encounter: Payer: Self-pay | Admitting: Internal Medicine

## 2023-08-21 VITALS — BP 126/78 | HR 71 | Temp 97.8°F | Ht 69.0 in | Wt 238.0 lb

## 2023-08-21 DIAGNOSIS — R29898 Other symptoms and signs involving the musculoskeletal system: Secondary | ICD-10-CM | POA: Diagnosis not present

## 2023-08-21 NOTE — Assessment & Plan Note (Signed)
 Nothing worrisome Some hip arthritis but not a limiting factor Discussed support hose for the mild edema Could consider PT referral--but discussed leg strengthening

## 2023-08-21 NOTE — Progress Notes (Signed)
 Subjective:    Patient ID: Robert Vega, male    DOB: 26-Jul-1940, 83 y.o.   MRN: [id number]  HPI Here with significant other after fall  Going up 3 steps up to house--and right leg gave out--this was yesterday Reached out to grab railing and twisted around Later in the day-he stumbled going to bridge on property to get the mailbox. Went to the ground. Had to roll over to reach Gator to pull himself up  Some pain in mid right thigh Notices generalized weakness  Some foot/leg swelling  Current Outpatient Medications on File Prior to Visit  Medication Sig Dispense Refill   acetaminophen (TYLENOL) 650 MG CR tablet Take 650 mg by mouth every morning.     aluminum hydroxide-magnesium carbonate (GAVISCON) 95-358 MG/15ML SUSP Take by mouth.     aspirin 81 MG tablet Take 81 mg by mouth daily.     diltiazem (CARDIZEM CD) 120 MG 24 hr capsule Take 120 mg by mouth daily.     GLUCOSAMINE-CHONDROITIN-MSM PO Take by mouth.     lansoprazole (PREVACID) 30 MG capsule TAKE ONE CAPSULE BY MOUTH EVERY NIGHT AT BEDTIME 90 capsule 2   Multiple Vitamins-Minerals (MULTIVITAMIN WITH MINERALS) tablet Take 1 tablet by mouth daily.     tadalafil (CIALIS) 20 MG tablet TAKE 1/2 TO 1 TABLET BY MOUTH EVERY OTHER DAY AS NEEDED FOR ERECTILE DYSFUNCTION 5 tablet 11   No current facility-administered medications on file prior to visit.    Allergies  Allergen Reactions   Sulfonamide Derivatives     Past Medical History:  Diagnosis Date   Allergy    Arthritis    BPH (benign prostatic hypertrophy)    Colonic polyp    GERD (gastroesophageal reflux disease)    Has LPR   Hypertension     Past Surgical History:  Procedure Laterality Date   CATARACT EXTRACTION     2011, other in 2013   COLONOSCOPY  2011   ESOPHAGOGASTRODUODENOSCOPY (EGD) WITH PROPOFOL N/A 08/30/2020   Procedure: ESOPHAGOGASTRODUODENOSCOPY (EGD) WITH PROPOFOL;  Surgeon: Wyline Mood, MD;  Location: [facility] ENDOSCOPY;  Service: Gastroenterology;   Laterality: N/A;   ESOPHAGOGASTRODUODENOSCOPY (EGD) WITH PROPOFOL N/A 04/02/2022   Procedure: ESOPHAGOGASTRODUODENOSCOPY (EGD) WITH PROPOFOL;  Surgeon: Wyline Mood, MD;  Location: [facility] ENDOSCOPY;  Service: Gastroenterology;  Laterality: N/A;   skin cancer removal      Family History  Problem Relation Age of Onset   Hypertension Mother    Heart disease Neg Hx    Diabetes Neg Hx    Cancer Neg Hx     Social History   Socioeconomic History   Marital status: Divorced    Spouse name: Not on file   Number of children: 2   Years of education: Not on file   Highest education level: Not on file  Occupational History   Occupation: retired--car dealer/sales  Tobacco Use   Smoking status: Former    Current packs/day: 0.00    Average packs/day: 2.0 packs/day for 50.0 years (100.0 ttl pk-yrs)    Types: Pipe, Cigarettes    Start date: 10/19/1964    Quit date: 10/20/2014    Years since quitting: 8.8    Passive exposure: Past   Smokeless tobacco: Never   Tobacco comments:    pipe   Vaping Use   Vaping status: Never Used  Substance and Sexual Activity   Alcohol use: No    Alcohol/week: 0.0 standard drinks of alcohol   Drug use: No   Sexual activity: Not  on file  Other Topics Concern   Not on file  Social History Narrative   Has living will   Two sons and granddaughter are health care POAs (GD is first ---is DPT)   Would accept resuscitation and tube feedings if appropriate   Social Drivers of Health   Financial Resource Strain: Not on file  Food Insecurity: No Food Insecurity (04/02/2022)   Hunger Vital Sign    Worried About Running Out of Food in the Last Year: Never true    Ran Out of Food in the Last Year: Never true  Transportation Needs: No Transportation Needs (04/02/2022)   PRAPARE - Administrator, Civil Service (Medical): No    Lack of Transportation (Non-Medical): No  Physical Activity: Not on file  Stress: Not on file  Social Connections: Not on file   Intimate Partner Violence: Not At Risk (04/02/2022)   Humiliation, Afraid, Rape, and Kick questionnaire    Fear of Current or Ex-Partner: No    Emotionally Abused: No    Physically Abused: No    Sexually Abused: No   Review of Systems     Objective:   Physical Exam Musculoskeletal:     Comments: Trace right ankle edema Hip ROM ---okay external rotation but limited internal (not painful)  Neurological:     Comments: Fairly normal gait Slight weakness in right hip flexors --pain related            Assessment & Plan:

## 2023-08-29 DIAGNOSIS — R0602 Shortness of breath: Secondary | ICD-10-CM | POA: Diagnosis not present

## 2023-09-11 DIAGNOSIS — R0602 Shortness of breath: Secondary | ICD-10-CM | POA: Diagnosis not present

## 2023-09-11 DIAGNOSIS — R058 Other specified cough: Secondary | ICD-10-CM | POA: Diagnosis not present

## 2023-09-11 DIAGNOSIS — J301 Allergic rhinitis due to pollen: Secondary | ICD-10-CM | POA: Diagnosis not present

## 2023-09-11 DIAGNOSIS — G4730 Sleep apnea, unspecified: Secondary | ICD-10-CM | POA: Diagnosis not present

## 2023-09-16 DIAGNOSIS — R1319 Other dysphagia: Secondary | ICD-10-CM | POA: Diagnosis not present

## 2023-09-16 DIAGNOSIS — R053 Chronic cough: Secondary | ICD-10-CM | POA: Diagnosis not present

## 2023-09-28 ENCOUNTER — Other Ambulatory Visit: Payer: Self-pay | Admitting: Internal Medicine

## 2023-10-05 ENCOUNTER — Inpatient Hospital Stay
Admission: EM | Admit: 2023-10-05 | Discharge: 2023-12-16 | DRG: 853 | Discharge status: Against Medical Advice | Attending: Internal Medicine | Admitting: Internal Medicine

## 2023-10-05 ENCOUNTER — Inpatient Hospital Stay

## 2023-10-05 ENCOUNTER — Emergency Department

## 2023-10-05 DIAGNOSIS — R6521 Severe sepsis with septic shock: Secondary | ICD-10-CM | POA: Diagnosis present

## 2023-10-05 DIAGNOSIS — I959 Hypotension, unspecified: Secondary | ICD-10-CM | POA: Diagnosis not present

## 2023-10-05 DIAGNOSIS — J0130 Acute sphenoidal sinusitis, unspecified: Secondary | ICD-10-CM | POA: Diagnosis not present

## 2023-10-05 DIAGNOSIS — N1832 Chronic kidney disease, stage 3b: Secondary | ICD-10-CM | POA: Diagnosis not present

## 2023-10-05 DIAGNOSIS — I120 Hypertensive chronic kidney disease with stage 5 chronic kidney disease or end stage renal disease: Secondary | ICD-10-CM | POA: Diagnosis not present

## 2023-10-05 DIAGNOSIS — Z882 Allergy status to sulfonamides status: Secondary | ICD-10-CM

## 2023-10-05 DIAGNOSIS — E669 Obesity, unspecified: Secondary | ICD-10-CM | POA: Diagnosis not present

## 2023-10-05 DIAGNOSIS — I5033 Acute on chronic diastolic (congestive) heart failure: Secondary | ICD-10-CM | POA: Diagnosis present

## 2023-10-05 DIAGNOSIS — R400 Somnolence: Secondary | ICD-10-CM | POA: Diagnosis not present

## 2023-10-05 DIAGNOSIS — K220 Achalasia of cardia: Secondary | ICD-10-CM | POA: Diagnosis not present

## 2023-10-05 DIAGNOSIS — E7220 Disorder of urea cycle metabolism, unspecified: Secondary | ICD-10-CM | POA: Diagnosis not present

## 2023-10-05 DIAGNOSIS — G9341 Metabolic encephalopathy: Secondary | ICD-10-CM | POA: Diagnosis not present

## 2023-10-05 DIAGNOSIS — Z515 Encounter for palliative care: Secondary | ICD-10-CM | POA: Diagnosis not present

## 2023-10-05 DIAGNOSIS — E871 Hypo-osmolality and hyponatremia: Secondary | ICD-10-CM | POA: Diagnosis not present

## 2023-10-05 DIAGNOSIS — N3289 Other specified disorders of bladder: Secondary | ICD-10-CM | POA: Diagnosis not present

## 2023-10-05 DIAGNOSIS — K2289 Other specified disease of esophagus: Secondary | ICD-10-CM | POA: Diagnosis not present

## 2023-10-05 DIAGNOSIS — Z87891 Personal history of nicotine dependence: Secondary | ICD-10-CM

## 2023-10-05 DIAGNOSIS — K449 Diaphragmatic hernia without obstruction or gangrene: Secondary | ICD-10-CM | POA: Diagnosis present

## 2023-10-05 DIAGNOSIS — Z9889 Other specified postprocedural states: Secondary | ICD-10-CM | POA: Diagnosis not present

## 2023-10-05 DIAGNOSIS — K651 Peritoneal abscess: Secondary | ICD-10-CM | POA: Diagnosis present

## 2023-10-05 DIAGNOSIS — Z85828 Personal history of other malignant neoplasm of skin: Secondary | ICD-10-CM

## 2023-10-05 DIAGNOSIS — N2581 Secondary hyperparathyroidism of renal origin: Secondary | ICD-10-CM | POA: Diagnosis not present

## 2023-10-05 DIAGNOSIS — E870 Hyperosmolality and hypernatremia: Secondary | ICD-10-CM | POA: Diagnosis not present

## 2023-10-05 DIAGNOSIS — K921 Melena: Secondary | ICD-10-CM

## 2023-10-05 DIAGNOSIS — K9423 Gastrostomy malfunction: Secondary | ICD-10-CM | POA: Diagnosis not present

## 2023-10-05 DIAGNOSIS — J189 Pneumonia, unspecified organism: Secondary | ICD-10-CM | POA: Diagnosis present

## 2023-10-05 DIAGNOSIS — Z9181 History of falling: Secondary | ICD-10-CM

## 2023-10-05 DIAGNOSIS — A419 Sepsis, unspecified organism: Secondary | ICD-10-CM | POA: Diagnosis not present

## 2023-10-05 DIAGNOSIS — J9602 Acute respiratory failure with hypercapnia: Secondary | ICD-10-CM | POA: Diagnosis present

## 2023-10-05 DIAGNOSIS — R109 Unspecified abdominal pain: Secondary | ICD-10-CM | POA: Diagnosis not present

## 2023-10-05 DIAGNOSIS — W19XXXA Unspecified fall, initial encounter: Secondary | ICD-10-CM | POA: Diagnosis not present

## 2023-10-05 DIAGNOSIS — E440 Moderate protein-calorie malnutrition: Secondary | ICD-10-CM | POA: Diagnosis present

## 2023-10-05 DIAGNOSIS — R0682 Tachypnea, not elsewhere classified: Secondary | ICD-10-CM | POA: Diagnosis not present

## 2023-10-05 DIAGNOSIS — D509 Iron deficiency anemia, unspecified: Secondary | ICD-10-CM | POA: Diagnosis present

## 2023-10-05 DIAGNOSIS — D631 Anemia in chronic kidney disease: Secondary | ICD-10-CM | POA: Diagnosis present

## 2023-10-05 DIAGNOSIS — I10 Essential (primary) hypertension: Secondary | ICD-10-CM | POA: Diagnosis not present

## 2023-10-05 DIAGNOSIS — S37019A Minor contusion of unspecified kidney, initial encounter: Secondary | ICD-10-CM | POA: Diagnosis not present

## 2023-10-05 DIAGNOSIS — R5381 Other malaise: Secondary | ICD-10-CM | POA: Diagnosis not present

## 2023-10-05 DIAGNOSIS — R392 Extrarenal uremia: Secondary | ICD-10-CM | POA: Diagnosis not present

## 2023-10-05 DIAGNOSIS — N1330 Unspecified hydronephrosis: Secondary | ICD-10-CM | POA: Diagnosis not present

## 2023-10-05 DIAGNOSIS — I517 Cardiomegaly: Secondary | ICD-10-CM | POA: Diagnosis not present

## 2023-10-05 DIAGNOSIS — G9340 Encephalopathy, unspecified: Secondary | ICD-10-CM

## 2023-10-05 DIAGNOSIS — N139 Obstructive and reflux uropathy, unspecified: Secondary | ICD-10-CM | POA: Diagnosis not present

## 2023-10-05 DIAGNOSIS — R1310 Dysphagia, unspecified: Secondary | ICD-10-CM | POA: Diagnosis not present

## 2023-10-05 DIAGNOSIS — E875 Hyperkalemia: Secondary | ICD-10-CM | POA: Diagnosis not present

## 2023-10-05 DIAGNOSIS — N170 Acute kidney failure with tubular necrosis: Secondary | ICD-10-CM | POA: Diagnosis not present

## 2023-10-05 DIAGNOSIS — R195 Other fecal abnormalities: Secondary | ICD-10-CM | POA: Diagnosis present

## 2023-10-05 DIAGNOSIS — Z794 Long term (current) use of insulin: Secondary | ICD-10-CM

## 2023-10-05 DIAGNOSIS — N179 Acute kidney failure, unspecified: Secondary | ICD-10-CM

## 2023-10-05 DIAGNOSIS — I771 Stricture of artery: Secondary | ICD-10-CM | POA: Diagnosis not present

## 2023-10-05 DIAGNOSIS — N186 End stage renal disease: Secondary | ICD-10-CM | POA: Diagnosis not present

## 2023-10-05 DIAGNOSIS — E876 Hypokalemia: Secondary | ICD-10-CM | POA: Diagnosis not present

## 2023-10-05 DIAGNOSIS — F05 Delirium due to known physiological condition: Secondary | ICD-10-CM | POA: Diagnosis present

## 2023-10-05 DIAGNOSIS — T17908A Unspecified foreign body in respiratory tract, part unspecified causing other injury, initial encounter: Secondary | ICD-10-CM | POA: Diagnosis not present

## 2023-10-05 DIAGNOSIS — K2100 Gastro-esophageal reflux disease with esophagitis, without bleeding: Secondary | ICD-10-CM | POA: Diagnosis not present

## 2023-10-05 DIAGNOSIS — R918 Other nonspecific abnormal finding of lung field: Secondary | ICD-10-CM | POA: Diagnosis not present

## 2023-10-05 DIAGNOSIS — Z992 Dependence on renal dialysis: Secondary | ICD-10-CM | POA: Diagnosis not present

## 2023-10-05 DIAGNOSIS — Z6835 Body mass index (BMI) 35.0-35.9, adult: Secondary | ICD-10-CM

## 2023-10-05 DIAGNOSIS — N329 Bladder disorder, unspecified: Secondary | ICD-10-CM | POA: Diagnosis not present

## 2023-10-05 DIAGNOSIS — R140 Abdominal distension (gaseous): Secondary | ICD-10-CM | POA: Diagnosis not present

## 2023-10-05 DIAGNOSIS — J690 Pneumonitis due to inhalation of food and vomit: Secondary | ICD-10-CM | POA: Diagnosis present

## 2023-10-05 DIAGNOSIS — R6520 Severe sepsis without septic shock: Principal | ICD-10-CM | POA: Insufficient documentation

## 2023-10-05 DIAGNOSIS — R001 Bradycardia, unspecified: Secondary | ICD-10-CM | POA: Diagnosis not present

## 2023-10-05 DIAGNOSIS — R34 Anuria and oliguria: Secondary | ICD-10-CM | POA: Diagnosis present

## 2023-10-05 DIAGNOSIS — K9429 Other complications of gastrostomy: Secondary | ICD-10-CM | POA: Diagnosis not present

## 2023-10-05 DIAGNOSIS — K659 Peritonitis, unspecified: Secondary | ICD-10-CM | POA: Diagnosis not present

## 2023-10-05 DIAGNOSIS — R069 Unspecified abnormalities of breathing: Secondary | ICD-10-CM | POA: Diagnosis not present

## 2023-10-05 DIAGNOSIS — R0989 Other specified symptoms and signs involving the circulatory and respiratory systems: Secondary | ICD-10-CM | POA: Diagnosis not present

## 2023-10-05 DIAGNOSIS — I878 Other specified disorders of veins: Secondary | ICD-10-CM | POA: Diagnosis not present

## 2023-10-05 DIAGNOSIS — K567 Ileus, unspecified: Secondary | ICD-10-CM | POA: Diagnosis present

## 2023-10-05 DIAGNOSIS — I2510 Atherosclerotic heart disease of native coronary artery without angina pectoris: Secondary | ICD-10-CM | POA: Diagnosis present

## 2023-10-05 DIAGNOSIS — R4182 Altered mental status, unspecified: Secondary | ICD-10-CM | POA: Diagnosis not present

## 2023-10-05 DIAGNOSIS — E1122 Type 2 diabetes mellitus with diabetic chronic kidney disease: Secondary | ICD-10-CM | POA: Diagnosis present

## 2023-10-05 DIAGNOSIS — Z95828 Presence of other vascular implants and grafts: Secondary | ICD-10-CM | POA: Diagnosis not present

## 2023-10-05 DIAGNOSIS — K2101 Gastro-esophageal reflux disease with esophagitis, with bleeding: Secondary | ICD-10-CM | POA: Diagnosis present

## 2023-10-05 DIAGNOSIS — R1110 Vomiting, unspecified: Secondary | ICD-10-CM | POA: Diagnosis not present

## 2023-10-05 DIAGNOSIS — Z8249 Family history of ischemic heart disease and other diseases of the circulatory system: Secondary | ICD-10-CM

## 2023-10-05 DIAGNOSIS — N320 Bladder-neck obstruction: Secondary | ICD-10-CM | POA: Diagnosis not present

## 2023-10-05 DIAGNOSIS — Z4901 Encounter for fitting and adjustment of extracorporeal dialysis catheter: Secondary | ICD-10-CM

## 2023-10-05 DIAGNOSIS — R627 Adult failure to thrive: Secondary | ICD-10-CM | POA: Diagnosis not present

## 2023-10-05 DIAGNOSIS — Z860100 Personal history of colon polyps, unspecified: Secondary | ICD-10-CM

## 2023-10-05 DIAGNOSIS — J9601 Acute respiratory failure with hypoxia: Secondary | ICD-10-CM

## 2023-10-05 DIAGNOSIS — R0600 Dyspnea, unspecified: Secondary | ICD-10-CM | POA: Diagnosis not present

## 2023-10-05 DIAGNOSIS — J811 Chronic pulmonary edema: Secondary | ICD-10-CM | POA: Diagnosis not present

## 2023-10-05 DIAGNOSIS — K9420 Gastrostomy complication, unspecified: Secondary | ICD-10-CM | POA: Diagnosis not present

## 2023-10-05 DIAGNOSIS — R0602 Shortness of breath: Secondary | ICD-10-CM | POA: Diagnosis not present

## 2023-10-05 DIAGNOSIS — E872 Acidosis, unspecified: Secondary | ICD-10-CM | POA: Diagnosis not present

## 2023-10-05 DIAGNOSIS — R1319 Other dysphagia: Secondary | ICD-10-CM | POA: Diagnosis not present

## 2023-10-05 DIAGNOSIS — K219 Gastro-esophageal reflux disease without esophagitis: Secondary | ICD-10-CM | POA: Diagnosis not present

## 2023-10-05 DIAGNOSIS — R0902 Hypoxemia: Secondary | ICD-10-CM | POA: Diagnosis not present

## 2023-10-05 DIAGNOSIS — T17918A Gastric contents in respiratory tract, part unspecified causing other injury, initial encounter: Secondary | ICD-10-CM | POA: Diagnosis present

## 2023-10-05 DIAGNOSIS — N400 Enlarged prostate without lower urinary tract symptoms: Secondary | ICD-10-CM | POA: Diagnosis present

## 2023-10-05 DIAGNOSIS — J90 Pleural effusion, not elsewhere classified: Secondary | ICD-10-CM | POA: Diagnosis not present

## 2023-10-05 DIAGNOSIS — E66812 Obesity, class 2: Secondary | ICD-10-CM | POA: Diagnosis present

## 2023-10-05 DIAGNOSIS — N471 Phimosis: Secondary | ICD-10-CM | POA: Diagnosis not present

## 2023-10-05 DIAGNOSIS — Z1152 Encounter for screening for COVID-19: Secondary | ICD-10-CM | POA: Diagnosis not present

## 2023-10-05 DIAGNOSIS — R410 Disorientation, unspecified: Secondary | ICD-10-CM | POA: Diagnosis not present

## 2023-10-05 DIAGNOSIS — K5900 Constipation, unspecified: Secondary | ICD-10-CM | POA: Diagnosis not present

## 2023-10-05 DIAGNOSIS — D72829 Elevated white blood cell count, unspecified: Secondary | ICD-10-CM

## 2023-10-05 DIAGNOSIS — Z7982 Long term (current) use of aspirin: Secondary | ICD-10-CM

## 2023-10-05 DIAGNOSIS — G928 Other toxic encephalopathy: Secondary | ICD-10-CM | POA: Diagnosis not present

## 2023-10-05 DIAGNOSIS — K8020 Calculus of gallbladder without cholecystitis without obstruction: Secondary | ICD-10-CM | POA: Diagnosis not present

## 2023-10-05 DIAGNOSIS — I129 Hypertensive chronic kidney disease with stage 1 through stage 4 chronic kidney disease, or unspecified chronic kidney disease: Secondary | ICD-10-CM | POA: Diagnosis not present

## 2023-10-05 DIAGNOSIS — I9589 Other hypotension: Secondary | ICD-10-CM | POA: Diagnosis present

## 2023-10-05 DIAGNOSIS — K209 Esophagitis, unspecified without bleeding: Secondary | ICD-10-CM | POA: Diagnosis not present

## 2023-10-05 DIAGNOSIS — Z5329 Procedure and treatment not carried out because of patient's decision for other reasons: Secondary | ICD-10-CM | POA: Diagnosis present

## 2023-10-05 DIAGNOSIS — Z59811 Housing instability, housed, with risk of homelessness: Secondary | ICD-10-CM | POA: Diagnosis not present

## 2023-10-05 DIAGNOSIS — K316 Fistula of stomach and duodenum: Secondary | ICD-10-CM | POA: Diagnosis not present

## 2023-10-05 DIAGNOSIS — I6782 Cerebral ischemia: Secondary | ICD-10-CM | POA: Diagnosis not present

## 2023-10-05 DIAGNOSIS — G4733 Obstructive sleep apnea (adult) (pediatric): Secondary | ICD-10-CM | POA: Diagnosis present

## 2023-10-05 DIAGNOSIS — R10814 Left lower quadrant abdominal tenderness: Secondary | ICD-10-CM | POA: Diagnosis not present

## 2023-10-05 DIAGNOSIS — R944 Abnormal results of kidney function studies: Secondary | ICD-10-CM | POA: Diagnosis not present

## 2023-10-05 DIAGNOSIS — Z931 Gastrostomy status: Secondary | ICD-10-CM | POA: Diagnosis not present

## 2023-10-05 DIAGNOSIS — K224 Dyskinesia of esophagus: Secondary | ICD-10-CM | POA: Diagnosis not present

## 2023-10-05 DIAGNOSIS — S37002A Unspecified injury of left kidney, initial encounter: Secondary | ICD-10-CM | POA: Diagnosis not present

## 2023-10-05 DIAGNOSIS — S37012A Minor contusion of left kidney, initial encounter: Secondary | ICD-10-CM | POA: Diagnosis present

## 2023-10-05 DIAGNOSIS — J810 Acute pulmonary edema: Secondary | ICD-10-CM | POA: Diagnosis not present

## 2023-10-05 DIAGNOSIS — Z87898 Personal history of other specified conditions: Secondary | ICD-10-CM | POA: Diagnosis not present

## 2023-10-05 DIAGNOSIS — R0689 Other abnormalities of breathing: Secondary | ICD-10-CM | POA: Diagnosis not present

## 2023-10-05 DIAGNOSIS — K317 Polyp of stomach and duodenum: Secondary | ICD-10-CM | POA: Diagnosis present

## 2023-10-05 DIAGNOSIS — R531 Weakness: Secondary | ICD-10-CM

## 2023-10-05 DIAGNOSIS — N134 Hydroureter: Secondary | ICD-10-CM | POA: Diagnosis not present

## 2023-10-05 DIAGNOSIS — Z4682 Encounter for fitting and adjustment of non-vascular catheter: Secondary | ICD-10-CM | POA: Diagnosis not present

## 2023-10-05 DIAGNOSIS — R4701 Aphasia: Secondary | ICD-10-CM | POA: Diagnosis present

## 2023-10-05 DIAGNOSIS — N368 Other specified disorders of urethra: Secondary | ICD-10-CM | POA: Diagnosis not present

## 2023-10-05 DIAGNOSIS — Z79899 Other long term (current) drug therapy: Secondary | ICD-10-CM

## 2023-10-05 DIAGNOSIS — R10813 Right lower quadrant abdominal tenderness: Secondary | ICD-10-CM | POA: Diagnosis not present

## 2023-10-05 DIAGNOSIS — Z452 Encounter for adjustment and management of vascular access device: Secondary | ICD-10-CM | POA: Diagnosis not present

## 2023-10-05 DIAGNOSIS — R1084 Generalized abdominal pain: Secondary | ICD-10-CM

## 2023-10-05 DIAGNOSIS — J9811 Atelectasis: Secondary | ICD-10-CM | POA: Diagnosis not present

## 2023-10-05 DIAGNOSIS — K222 Esophageal obstruction: Secondary | ICD-10-CM | POA: Diagnosis present

## 2023-10-05 DIAGNOSIS — R933 Abnormal findings on diagnostic imaging of other parts of digestive tract: Secondary | ICD-10-CM | POA: Diagnosis not present

## 2023-10-05 DIAGNOSIS — D649 Anemia, unspecified: Secondary | ICD-10-CM | POA: Diagnosis not present

## 2023-10-05 DIAGNOSIS — I132 Hypertensive heart and chronic kidney disease with heart failure and with stage 5 chronic kidney disease, or end stage renal disease: Secondary | ICD-10-CM | POA: Diagnosis present

## 2023-10-05 DIAGNOSIS — Z431 Encounter for attention to gastrostomy: Secondary | ICD-10-CM | POA: Diagnosis not present

## 2023-10-05 DIAGNOSIS — R509 Fever, unspecified: Secondary | ICD-10-CM | POA: Diagnosis not present

## 2023-10-05 DIAGNOSIS — F0390 Unspecified dementia without behavioral disturbance: Secondary | ICD-10-CM | POA: Diagnosis not present

## 2023-10-05 DIAGNOSIS — N19 Unspecified kidney failure: Secondary | ICD-10-CM | POA: Diagnosis not present

## 2023-10-05 DIAGNOSIS — R569 Unspecified convulsions: Secondary | ICD-10-CM | POA: Diagnosis not present

## 2023-10-05 DIAGNOSIS — I509 Heart failure, unspecified: Secondary | ICD-10-CM | POA: Diagnosis not present

## 2023-10-05 DIAGNOSIS — R54 Age-related physical debility: Secondary | ICD-10-CM | POA: Diagnosis present

## 2023-10-05 DIAGNOSIS — T17900A Unspecified foreign body in respiratory tract, part unspecified causing asphyxiation, initial encounter: Secondary | ICD-10-CM | POA: Diagnosis not present

## 2023-10-05 DIAGNOSIS — G319 Degenerative disease of nervous system, unspecified: Secondary | ICD-10-CM | POA: Diagnosis not present

## 2023-10-05 LAB — BLOOD GAS, VENOUS
Acid-Base Excess: 3.4 mmol/L — ABNORMAL HIGH (ref 0.0–2.0)
Bicarbonate: 29.6 mmol/L — ABNORMAL HIGH (ref 20.0–28.0)
O2 Saturation: 52.3 %
Patient temperature: 37
pCO2, Ven: 50 mmHg (ref 44–60)
pH, Ven: 7.38 (ref 7.25–7.43)
pO2, Ven: 33 mmHg (ref 32–45)

## 2023-10-05 LAB — CBC WITH DIFFERENTIAL/PLATELET
Abs Immature Granulocytes: 0.01 10*3/uL (ref 0.00–0.07)
Basophils Absolute: 0 10*3/uL (ref 0.0–0.1)
Basophils Relative: 0 %
Eosinophils Absolute: 0 10*3/uL (ref 0.0–0.5)
Eosinophils Relative: 0 %
HCT: 35.5 % — ABNORMAL LOW (ref 39.0–52.0)
Hemoglobin: 12 g/dL — ABNORMAL LOW (ref 13.0–17.0)
Immature Granulocytes: 0 %
Lymphocytes Relative: 4 %
Lymphs Abs: 0.3 10*3/uL — ABNORMAL LOW (ref 0.7–4.0)
MCH: 33.1 pg (ref 26.0–34.0)
MCHC: 33.8 g/dL (ref 30.0–36.0)
MCV: 98.1 fL (ref 80.0–100.0)
Monocytes Absolute: 0.3 10*3/uL (ref 0.1–1.0)
Monocytes Relative: 4 %
Neutro Abs: 7.1 10*3/uL (ref 1.7–7.7)
Neutrophils Relative %: 92 %
Platelets: 216 10*3/uL (ref 150–400)
RBC: 3.62 MIL/uL — ABNORMAL LOW (ref 4.22–5.81)
RDW: 13.2 % (ref 11.5–15.5)
WBC: 7.7 10*3/uL (ref 4.0–10.5)
nRBC: 0 % (ref 0.0–0.2)

## 2023-10-05 LAB — LIPASE, BLOOD: Lipase: 23 U/L (ref 11–51)

## 2023-10-05 LAB — RESPIRATORY PANEL BY PCR

## 2023-10-05 LAB — BRAIN NATRIURETIC PEPTIDE: B Natriuretic Peptide: 70.6 pg/mL (ref 0.0–100.0)

## 2023-10-05 LAB — EXPECTORATED SPUTUM ASSESSMENT W GRAM STAIN, RFLX TO RESP C

## 2023-10-05 LAB — COMPREHENSIVE METABOLIC PANEL WITH GFR
ALT: 16 U/L (ref 0–44)
AST: 28 U/L (ref 15–41)
Albumin: 3.8 g/dL (ref 3.5–5.0)
Alkaline Phosphatase: 64 U/L (ref 38–126)
Anion gap: 12 (ref 5–15)
BUN: 21 mg/dL (ref 8–23)
CO2: 26 mmol/L (ref 22–32)
Calcium: 8.5 mg/dL — ABNORMAL LOW (ref 8.9–10.3)
Chloride: 99 mmol/L (ref 98–111)
Creatinine, Ser: 1 mg/dL (ref 0.61–1.24)
GFR, Estimated: >60 mL/min (ref >60)
Glucose, Bld: 152 mg/dL — ABNORMAL HIGH (ref 70–99)
Potassium: 3.5 mmol/L (ref 3.5–5.1)
Sodium: 137 mmol/L (ref 135–145)
Total Bilirubin: 1.3 mg/dL — ABNORMAL HIGH (ref 0.0–1.2)
Total Protein: 7.4 g/dL (ref 6.5–8.1)

## 2023-10-05 LAB — URINALYSIS, W/ REFLEX TO CULTURE (INFECTION SUSPECTED)
Bacteria, UA: NONE SEEN
Bilirubin Urine: NEGATIVE
Glucose, UA: NEGATIVE mg/dL
Ketones, ur: 20 mg/dL — AB
Leukocytes,Ua: NEGATIVE
Nitrite: NEGATIVE
Protein, ur: 30 mg/dL — AB
Specific Gravity, Urine: 1.028 (ref 1.005–1.030)
pH: 5 (ref 5.0–8.0)

## 2023-10-05 LAB — BLOOD GAS, ARTERIAL
Acid-Base Excess: 2.5 mmol/L — ABNORMAL HIGH (ref 0.0–2.0)
Bicarbonate: 28.4 mmol/L — ABNORMAL HIGH (ref 20.0–28.0)
O2 Content: 6 L/min
O2 Saturation: 99.4 %
Patient temperature: 37
pCO2 arterial: 48 mmHg (ref 32–48)
pH, Arterial: 7.38 (ref 7.35–7.45)
pO2, Arterial: 97 mmHg (ref 83–108)

## 2023-10-05 LAB — RESP PANEL BY RT-PCR (RSV, FLU A&B, COVID)  RVPGX2
Influenza A by PCR: NEGATIVE
Influenza B by PCR: NEGATIVE
Resp Syncytial Virus by PCR: NEGATIVE
SARS Coronavirus 2 by RT PCR: NEGATIVE

## 2023-10-05 LAB — PROTIME-INR
INR: 1.1 (ref 0.8–1.2)
Prothrombin Time: 14.7 s (ref 11.4–15.2)

## 2023-10-05 LAB — LACTIC ACID, PLASMA
Lactic Acid, Venous: 2.2 mmol/L (ref 0.5–1.9)
Lactic Acid, Venous: 1.7 mmol/L (ref 0.5–1.9)

## 2023-10-05 LAB — TROPONIN I (HIGH SENSITIVITY): Troponin I (High Sensitivity): 14 ng/L (ref <18)

## 2023-10-05 LAB — STREP PNEUMONIAE URINARY ANTIGEN: Strep Pneumo Urinary Antigen: NEGATIVE

## 2023-10-05 LAB — GLUCOSE, CAPILLARY: Glucose-Capillary: 133 mg/dL — ABNORMAL HIGH (ref 70–99)

## 2023-10-05 MED ORDER — DIAZEPAM 5 MG/ML IJ SOLN: 10.0000 mg | INTRAMUSCULAR | Status: DC

## 2023-10-05 MED ORDER — SODIUM CHLORIDE 0.9 % IV SOLN
500.0000 mg | Freq: Once | INTRAVENOUS | Status: AC
  Administered 2023-10-05: 500 mg via INTRAVENOUS
  Filled 2023-10-05: qty 5

## 2023-10-05 MED ORDER — ONDANSETRON HCL 4 MG/2ML IJ SOLN
4.0000 mg | Freq: Four times a day (QID) | INTRAMUSCULAR | Status: DC | PRN
Start: 2023-10-05 — End: 2023-12-16
  Administered 2023-10-05 (×2): 4 mg via INTRAVENOUS
  Filled 2023-10-05 (×3): qty 2

## 2023-10-05 MED ORDER — LACTATED RINGERS IV BOLUS (SEPSIS)
1000.0000 mL | Freq: Once | INTRAVENOUS | Status: AC
  Administered 2023-10-05: 1000 mL via INTRAVENOUS

## 2023-10-05 MED ORDER — FUROSEMIDE 10 MG/ML IJ SOLN
20.0000 mg | Freq: Once | INTRAMUSCULAR | Status: AC
  Administered 2023-10-05: 20 mg via INTRAVENOUS
  Filled 2023-10-05: qty 4

## 2023-10-05 MED ORDER — SODIUM CHLORIDE 0.9 % IV SOLN: INTRAVENOUS | Status: AC

## 2023-10-05 MED ORDER — HALOPERIDOL LACTATE 5 MG/ML IJ SOLN
2.5000 mg | Freq: Once | INTRAMUSCULAR | Status: AC
  Administered 2023-10-05: 2.5 mg via INTRAVENOUS
  Filled 2023-10-05: qty 1

## 2023-10-05 MED ORDER — SODIUM CHLORIDE 0.9 % IV SOLN
500.0000 mg | INTRAVENOUS | Status: DC
  Administered 2023-10-06: 500 mg via INTRAVENOUS
  Filled 2023-10-05 (×2): qty 5

## 2023-10-05 MED ORDER — SODIUM CHLORIDE 0.9 % IV BOLUS
1500.0000 mL | Freq: Once | INTRAVENOUS | Status: AC
  Administered 2023-10-05: 1500 mL via INTRAVENOUS

## 2023-10-05 MED ORDER — SODIUM CHLORIDE 0.9 % IV SOLN
2.0000 g | Freq: Once | INTRAVENOUS | Status: AC
  Administered 2023-10-05: 2 g via INTRAVENOUS
  Filled 2023-10-05: qty 20

## 2023-10-05 MED ORDER — IPRATROPIUM-ALBUTEROL 0.5-2.5 (3) MG/3ML IN SOLN
3.0000 mL | RESPIRATORY_TRACT | Status: DC | PRN
Start: 2023-10-05 — End: 2023-10-12
  Administered 2023-10-05: 3 mL via RESPIRATORY_TRACT
  Filled 2023-10-05: qty 3

## 2023-10-05 MED ORDER — PROPOFOL 1000 MG/100ML IV EMUL
0.0000 ug/kg/min | INTRAVENOUS | Status: DC
  Administered 2023-10-05: 5 ug/kg/min via INTRAVENOUS
  Administered 2023-10-06 (×4): 25 ug/kg/min via INTRAVENOUS
  Administered 2023-10-07: 20 ug/kg/min via INTRAVENOUS
  Administered 2023-10-07: 30 ug/kg/min via INTRAVENOUS
  Administered 2023-10-07: 25 ug/kg/min via INTRAVENOUS
  Administered 2023-10-08: 20 ug/kg/min via INTRAVENOUS
  Administered 2023-10-08: 10 ug/kg/min via INTRAVENOUS
  Administered 2023-10-08: 15 ug/kg/min via INTRAVENOUS
  Administered 2023-10-09: 20 ug/kg/min via INTRAVENOUS
  Administered 2023-10-09: 15 ug/kg/min via INTRAVENOUS
  Filled 2023-10-05 (×14): qty 100

## 2023-10-05 MED ORDER — FENTANYL CITRATE PF 50 MCG/ML IJ SOSY
PREFILLED_SYRINGE | INTRAMUSCULAR | Status: AC
Start: 2023-10-05 — End: 2023-10-05
  Administered 2023-10-05: 100 ug via INTRAVENOUS
  Filled 2023-10-05: qty 1

## 2023-10-05 MED ORDER — FENTANYL CITRATE PF 50 MCG/ML IJ SOSY: 25.0000 ug | PREFILLED_SYRINGE | Freq: Once | INTRAMUSCULAR | Status: DC

## 2023-10-05 MED ORDER — FENTANYL BOLUS VIA INFUSION
25.0000 ug | INTRAVENOUS | Status: DC | PRN
  Administered 2023-10-05: 100 ug via INTRAVENOUS
  Administered 2023-10-07: 50 ug via INTRAVENOUS
  Administered 2023-10-07: 100 ug via INTRAVENOUS
  Administered 2023-10-07: 50 ug via INTRAVENOUS
  Administered 2023-10-07 (×2): 100 ug via INTRAVENOUS
  Administered 2023-10-07: 50 ug via INTRAVENOUS
  Administered 2023-10-08: 100 ug via INTRAVENOUS
  Administered 2023-10-08: 50 ug via INTRAVENOUS
  Administered 2023-10-08 (×2): 100 ug via INTRAVENOUS
  Administered 2023-10-09 (×2): 50 ug via INTRAVENOUS

## 2023-10-05 MED ORDER — ETOMIDATE 2 MG/ML IV SOLN
10.0000 mg | Freq: Once | INTRAVENOUS | Status: AC
  Administered 2023-10-05: 10 mg via INTRAVENOUS
  Filled 2023-10-05: qty 10

## 2023-10-05 MED ORDER — METRONIDAZOLE 500 MG/100ML IV SOLN
500.0000 mg | Freq: Once | INTRAVENOUS | Status: AC
  Administered 2023-10-05: 500 mg via INTRAVENOUS
  Filled 2023-10-05: qty 100

## 2023-10-05 MED ORDER — POLYETHYLENE GLYCOL 3350 17 G PO PACK
17.0000 g | PACK | Freq: Every day | ORAL | Status: DC
  Administered 2023-10-09: 17 g
  Filled 2023-10-05: qty 1

## 2023-10-05 MED ORDER — FENTANYL 2500MCG IN NS 250ML (10MCG/ML) PREMIX INFUSION
25.0000 ug/h | INTRAVENOUS | Status: DC
Start: 2023-10-05 — End: 2023-10-09
  Administered 2023-10-05: 25 ug/h via INTRAVENOUS
  Administered 2023-10-07: 125 ug/h via INTRAVENOUS
  Administered 2023-10-08: 150 ug/h via INTRAVENOUS
  Administered 2023-10-09: 175 ug/h via INTRAVENOUS
  Filled 2023-10-05 (×5): qty 250

## 2023-10-05 MED ORDER — ONDANSETRON HCL 4 MG PO TABS: 4.0000 mg | ORAL_TABLET | Freq: Four times a day (QID) | ORAL | Status: DC | PRN

## 2023-10-05 MED ORDER — FENTANYL CITRATE PF 50 MCG/ML IJ SOSY
100.0000 ug | PREFILLED_SYRINGE | Freq: Once | INTRAMUSCULAR | Status: AC
  Filled 2023-10-05: qty 2

## 2023-10-05 MED ORDER — ENOXAPARIN SODIUM 80 MG/0.8ML IJ SOSY
0.5000 mg/kg | PREFILLED_SYRINGE | INTRAMUSCULAR | Status: DC
  Administered 2023-10-05 – 2023-10-11 (×6): 70 mg via SUBCUTANEOUS
  Filled 2023-10-05 (×2): qty 0.8
  Filled 2023-10-05: qty 0.7
  Filled 2023-10-05 (×5): qty 0.8

## 2023-10-05 MED ORDER — ACETAMINOPHEN 500 MG PO TABS
1000.0000 mg | ORAL_TABLET | Freq: Once | ORAL | Status: AC
  Administered 2023-10-05: 1000 mg via ORAL
  Filled 2023-10-05: qty 2

## 2023-10-05 MED ORDER — ACETAMINOPHEN 325 MG PO TABS: 650.0000 mg | ORAL_TABLET | Freq: Four times a day (QID) | ORAL | Status: DC | PRN

## 2023-10-05 MED ORDER — FAMOTIDINE IN NACL 20-0.9 MG/50ML-% IV SOLN
20.0000 mg | INTRAVENOUS | Status: DC
  Administered 2023-10-05 – 2023-10-09 (×5): 20 mg via INTRAVENOUS
  Filled 2023-10-05 (×5): qty 50

## 2023-10-05 MED ORDER — SODIUM CHLORIDE 0.9 % IV SOLN
2.0000 g | INTRAVENOUS | Status: DC
  Filled 2023-10-05: qty 20

## 2023-10-05 MED ORDER — ROCURONIUM BROMIDE 10 MG/ML (PF) SYRINGE
100.0000 mg | PREFILLED_SYRINGE | Freq: Once | INTRAVENOUS | Status: AC
  Administered 2023-10-05: 100 mg via INTRAVENOUS
  Filled 2023-10-05: qty 10

## 2023-10-05 MED ORDER — DOCUSATE SODIUM 50 MG/5ML PO LIQD
100.0000 mg | Freq: Two times a day (BID) | ORAL | Status: DC
  Administered 2023-10-05 – 2023-10-09 (×2): 100 mg
  Filled 2023-10-05 (×4): qty 10

## 2023-10-05 MED ORDER — HALOPERIDOL LACTATE 5 MG/ML IJ SOLN
INTRAMUSCULAR | Status: AC
  Administered 2023-10-05: 2.5 mg
  Filled 2023-10-05: qty 1

## 2023-10-05 MED ORDER — SODIUM CHLORIDE 0.9 % IV SOLN: 12.5000 mg | Freq: Four times a day (QID) | INTRAVENOUS | Status: DC | PRN

## 2023-10-05 NOTE — Progress Notes (Signed)
 CODE SEPSIS - PHARMACY COMMUNICATION  **Broad Spectrum Antibiotics should be administered within 1 hour of Sepsis diagnosis**  Time Code Sepsis Called/Page Received: 5/10 @ 0534   Antibiotics Ordered: Ceftriaxone, Azithromycin  Time of 1st antibiotic administration: Ceftriaxone 2 gm IV X 1 on 5/10 @ 0605  Additional action taken by pharmacy:   If necessary, Name of Provider/Nurse Contacted:     Tabytha Gradillas D ,PharmD Clinical Pharmacist  10/05/2023  6:25 AM

## 2023-10-05 NOTE — ED Notes (Signed)
 Placed patient back on non re-breather to maintain oxygenation in the 90s. Currently on 10L Non re breather.

## 2023-10-05 NOTE — H&P (Addendum)
 History and Physical    Patient: Robert Vega ZOX:[id number] DOB: 09/04/1940 DOA: 10/05/2023 DOS: the patient was seen and examined on 10/05/2023 PCP: Robert Llanos, MD  Patient coming from: Home  Chief Complaint:  Chief Complaint  Patient presents with   Weakness   HPI: Robert Vega is a 83 y.o. male with medical history significant of hypertension, sleep apnea, obesity, GERD presenting with acute febrile hypoxia, sepsis  and pneumonia.  Patient noted to be overall poor historian.  Per report, patient with increased work of breathing generalized weakness over the past 12 to 24 hours.  Was initially evaluated urgent care however EMS had to be called.  No severe weakness.  Mild cough per report.  No chest pain or abdominal pain.  No reported nausea or vomiting.  Seen by EMS with noted fever 100.7 and route.  Hypoxic to the mid 80s on room air. Presented to the ER Tmax 22.1, heart rate 90s, respirations mid 20s, BP 80s to 130s.  Requiring 4 L nasal cannula to keep O2 sats greater than 92%.  White count 7.7, hemoglobin 12, platelets 216, lactate 2.2.  COVID flu RSV negative.  Creatinine 1.  Glucose 152.  Chest x-ray with bilateral lower lobe pneumonia.  Positive pulmonary vascular congestion. Review of Systems: As mentioned in the history of present illness. All other systems reviewed and are negative. Past Medical History:  Diagnosis Date   Allergy    Arthritis    BPH (benign prostatic hypertrophy)    Colonic polyp    GERD (gastroesophageal reflux disease)    Has LPR   Hypertension    Past Surgical History:  Procedure Laterality Date   CATARACT EXTRACTION     2011, other in 2013   COLONOSCOPY  2011   ESOPHAGOGASTRODUODENOSCOPY (EGD) WITH PROPOFOL  N/A 08/30/2020   Procedure: ESOPHAGOGASTRODUODENOSCOPY (EGD) WITH PROPOFOL ;  Surgeon: Robert Salaam, MD;  Location: [facility] ENDOSCOPY;  Service: Gastroenterology;  Laterality: N/A;   ESOPHAGOGASTRODUODENOSCOPY (EGD) WITH PROPOFOL  N/A  04/02/2022   Procedure: ESOPHAGOGASTRODUODENOSCOPY (EGD) WITH PROPOFOL ;  Surgeon: Robert Salaam, MD;  Location: [facility] ENDOSCOPY;  Service: Gastroenterology;  Laterality: N/A;   skin cancer removal     Social History:  reports that he quit smoking about 8 years ago. His smoking use included pipe and cigarettes. He started smoking about 59 years ago. He has a 100 pack-year smoking history. He has been exposed to tobacco smoke. He has never used smokeless tobacco. He reports that he does not drink alcohol and does not use drugs.  Allergies  Allergen Reactions   Sulfonamide Derivatives     Family History  Problem Relation Age of Onset   Hypertension Mother    Heart disease Neg Hx    Diabetes Neg Hx    Cancer Neg Hx     Prior to Admission medications   Medication Sig Start Date End Date Taking? Authorizing Provider  acetaminophen  (TYLENOL ) 650 MG CR tablet Take 650 mg by mouth every morning.    [provider]  aluminum hydroxide-magnesium carbonate (GAVISCON) 95-358 MG/15ML SUSP Take by mouth.    [provider]  aspirin  81 MG tablet Take 81 mg by mouth daily.    [provider]  diltiazem  (CARDIZEM  CD) 120 MG 24 hr capsule Take 120 mg by mouth daily. 07/29/23 07/28/24  [provider]  GLUCOSAMINE-CHONDROITIN-MSM PO Take by mouth.    [provider]  lansoprazole  (PREVACID ) 30 MG capsule TAKE ONE CAPSULE BY MOUTH EVERY NIGHT AT BEDTIME 02/25/23  Vega, Robert I, MD  Multiple Vitamins-Minerals (MULTIVITAMIN WITH MINERALS) tablet Take 1 tablet by mouth daily.    [provider]  tadalafil  (CIALIS ) 20 MG tablet TAKE 1/2 TO 1 TABLET BY MOUTH EVERY OTHER DAY AS NEEDED FOR ERECTILE DYSFUNCTION 09/20/22   Robert Llanos, MD    Physical Exam: Vitals:   10/05/23 0530 10/05/23 0600 10/05/23 0630 10/05/23 0703  BP: (!) 85/63 126/63 135/67   Pulse: 86 85 78   Resp: (!) 25 (!) 33 (!) 28   Temp:    100 F (37.8 C)  TempSrc:    Oral  SpO2: 95%  94% 95%   Weight:      Height:       Physical Exam Constitutional:      Appearance: He is obese.  HENT:     Head: Normocephalic and atraumatic.     Nose: Nose normal.     Mouth/Throat:     Mouth: Mucous membranes are moist.  Eyes:     Pupils: Pupils are equal, round, and reactive to light.  Cardiovascular:     Rate and Rhythm: Normal rate and regular rhythm.  Pulmonary:     Effort: Pulmonary effort is normal.  Abdominal:     General: Bowel sounds are normal.  Musculoskeletal:        General: Normal range of motion.  Skin:    General: Skin is warm.  Neurological:     General: No focal deficit present.  Psychiatric:        Mood and Affect: Mood normal.     Data Reviewed:  There are no new results to review at this time.  DG Chest Port 1 View CLINICAL DATA:  Questionable sepsis.  EXAM: PORTABLE CHEST 1 VIEW  COMPARISON:  08/08/2023  FINDINGS: The heart size and mediastinal contours are unremarkable. There is bilateral lower lobe airspace opacities, right greater than left. Pulmonary vascular congestion. No pleural effusions. Visualized osseous structures are unremarkable.  IMPRESSION: 1. Bilateral lower lobe airspace opacities, right greater than left. Findings are concerning for pneumonia. 2. Pulmonary vascular congestion.  Electronically Signed   By: Robert Vega M.D.   On: 10/05/2023 07:29  Lab Results  Component Value Date   WBC 7.7 10/05/2023   HGB 12.0 (L) 10/05/2023   HCT 35.5 (L) 10/05/2023   MCV 98.1 10/05/2023   PLT 216 10/05/2023   Last metabolic panel Lab Results  Component Value Date   GLUCOSE 152 (H) 10/05/2023   NA 137 10/05/2023   K 3.5 10/05/2023   CL 99 10/05/2023   CO2 26 10/05/2023   BUN 21 10/05/2023   CREATININE 1.00 10/05/2023   GFRNONAA >60 10/05/2023   CALCIUM 8.5 (L) 10/05/2023   PHOS 2.8 05/05/2010   PROT 7.4 10/05/2023   ALBUMIN 3.8 10/05/2023   BILITOT 1.3 (H) 10/05/2023   ALKPHOS 64 10/05/2023   AST 28  10/05/2023   ALT 16 10/05/2023   ANIONGAP 12 10/05/2023    Assessment and Plan: Acute respiratory failure with hypoxia (HCC) Pneumonia Decompensated respiratory status now requiring 2 L with noted pneumonia on imaging Placed on IV Rocephin azithromycin for infectious coverage Some question of aspiration component-Flagyl added in the ER Blood and respiratory cultures Some question of vascular congestion  BNP WNL  Check 2D ECHO to coorelate  Prn IV lasix  Continue supplemental oxygen Monitor  Dysphagia ?  Aspiration event associated with pneumonia today Noted longstanding issue w/ evaluation by GI including Dr. Antony Baumgartner 04/2022 (required resuscitation with  upper endoscopy- rec for intubation for any further endoscopic procedures)  Will add on SLP evaluation in the interim Bedside swallow eval Advance diet as tolerated Consult GI as appropriate    GERD (gastroesophageal reflux disease) PPI   Essential hypertension, benign BP stable Titrate home regimen  Sepsis (HCC) Meeting sepsis criteria with temperature 102, heart rate 90s Noted multifocal pneumonia on chest x-ray Lactate 2.2 Panculture IV Rocephin and azithromycin  for respiratory coverage Minimal IV fluids with concern for vascular congestion on chest x-ray Otherwise monitor      Advance Care Planning:   Code Status: Full Code   Consults: None   Family Communication: No family at the bedside   Severity of Illness: The appropriate patient status for this patient is OBSERVATION. Observation status is judged to be reasonable and necessary in order to provide the required intensity of service to ensure the patient's safety. The patient's presenting symptoms, physical exam findings, and initial radiographic and laboratory data in the context of their medical condition is felt to place them at decreased risk for further clinical deterioration. Furthermore, it is anticipated that the patient will be medically stable for  discharge from the hospital within 2 midnights of admission.   Author: Corrinne Din, MD 10/05/2023 7:56 AM  For on call review [email or web address].

## 2023-10-05 NOTE — Progress Notes (Signed)
 Pt escorted to CT via bed with RN, AC, RT, Engineer, materials and NP.  Haldol 2.5mg  IV given in CT scan for a total of 5mg .  Pt vomited while en route back to room.  NG placed in right nare, positive auscultation and stomach contents noted.  Xray verification pending.  Pt remains on NRB and sats >95%.  Verbally responsive to name call.  Bedside report given to ICU RN.   Sheela Denmark BSN RN CMSRN 10/05/2023, 9:45 PM

## 2023-10-05 NOTE — Progress Notes (Signed)
 eLink Physician-Brief Progress Note Patient Name: RIEL KORSON DOB: 1940-06-28 MRN: [id number]   Date of Service  10/05/2023  HPI/Events of Note  83 year old male with a history of morbid obesity, essential hypertension, sleep apnea, obesity, and gastroesophageal reflux disease presenting with acute febrile hypoxia, sepsis, and pneumonia.  He was transferred to the ICU after persistent respiratory failure despite support with high flow nasal cannula and BiPAP and intubated.  Vital signs are normal with 96% saturation on 70% FiO2.  Currently on fentanyl and propofol  infusions.  Laboratory studies show adequate ventilation and oxygenation, mild electrolyte disturbances, normocytic anemia.  Negative pathogen panel, marked bilateral infiltrates on CT chest.  Airway collapse concerning for Edac.  eICU Interventions  Maintain CAP coverage  Mechanical ventilation, daily spontaneous awakening and breathing trials  DVT prophylaxis with enoxaparin -dose adjusted for weight GI prophylaxis with famotidine     Intervention Category Evaluation Type: New Patient Evaluation  Shundra Wirsing 10/05/2023, 11:27 PM

## 2023-10-05 NOTE — ED Notes (Signed)
 Patient was able to eat 25% of his meal. With intake. Patient is now having belching episodes. Patient has NOT vomited. Patient states this has been increasing the last 3 days since eating.

## 2023-10-05 NOTE — Assessment & Plan Note (Addendum)
 Likely secondary to achalasia.  Patient has a PEG tube placement.  Patient had Botox injection of the lower esophageal sphincter due to lower esophageal sphincter hypertension causing the patient not being able to swallow and empty the esophagus of his ingested food.  Food was in the esophagus on the previous EGD.  With barium swallow study results, I do not want to give him any food.  Patient wants to continue liquids and is still at a higher risk of aspiration

## 2023-10-05 NOTE — Consult Note (Signed)
 PHARMACIST - PHYSICIAN COMMUNICATION  CONCERNING:  Enoxaparin  (Lovenox ) for DVT Prophylaxis   RECOMMENDATION: Patient was prescribed enoxaparin  40mg  q24 hours for VTE prophylaxis.   Filed Weights   10/05/23 0457  Weight: (!) 138.6 kg (305 lb 8.9 oz)   Body mass index is 45.12 kg/m.  Based on [facility] policy patient is candidate for enoxaparin  0.5mg /kg TBW SQ every 24 hours based on BMI being >30.  DESCRIPTION: Pharmacy has adjusted enoxaparin  dose per [facility] policy.  Patient is now receiving enoxaparin  70 mg every 24 hours   Pansy Bogus, PharmD Pharmacy Resident  10/05/2023 7:51 AM

## 2023-10-05 NOTE — ED Notes (Signed)
 Made MD and ST aware of patient's episodes with eating lunch.

## 2023-10-05 NOTE — ED Triage Notes (Signed)
 Patient brought by [facility] from an Urgent Care parking lot. Patients wife was driving patient to the ED when she had to pull over and call EMS due to the patient becoming severely weak. EMS states that patient had a fever of 100.7 in route. Patient noted to be have an O2 of mid-80's on RA, SOB, and then was placed on 6 liters in route. Patient has had s/s since yesterday per EMS.

## 2023-10-05 NOTE — ED Notes (Signed)
RT in room.

## 2023-10-05 NOTE — ED Notes (Signed)
 Patient had 2 episodes of small vomiting - Zofran  given for nausea and vomiting.

## 2023-10-05 NOTE — Significant Event (Signed)
       CROSS COVER NOTE  NAME: Robert Vega MRN: [id number] DOB : 02-Jun-1940 ATTENDING PHYSICIAN: Corrinne Din, MD    Date of Service   10/05/2023   HPI/Events of Note   Notified via chat patient ambulating in C hallway outside of 2C unit agitated confused and refusing to get on bed to go to CT. Security had been called ON 2 C patient seitting on bench in hall, acutely confused to place and situation, paranoid.  According to eent patient was ofund on first floor ambulating by the RT and he ambulated with RT back to 2C on room air (refused oxygen) Upon getting to 2 C vomitus and likely repeat aspiration event Haldol 2.5 mg given, patint willing to get onto bed and go to /ct Accompanied patient to CT , additional 2.5 mg haldol needed tog keep patient on CT table On way back to room patient vomited again Food stuff suctioned from laryngea area and patient with course cough Repeated aspiration evident even with NGT placed upon arrival back to unit on 2C  Repeated attempts to find working number for emergency contact or family but was unsuccessful   Interventions   Assessment/Plan:  Latest Reference Range & Units 10/05/23 21:11  Delivery systems  HI FLOW NASAL CANNULA  O2 Content L/min 6.0  pH, Arterial 7.35 - 7.45  7.38  pCO2 arterial 32 - 48 mmHg 48  pO2, Arterial 83 - 108 mmHg 97  Acid-Base Excess 0.0 - 2.0 mmol/L 2.5 (H)  Bicarbonate 20.0 - 28.0 mmol/L 28.4 (H)  O2 Saturation % 99.4  Patient temperature  37.0  Collection site  LEFT RADIAL  Allens test (pass/fail) PASS  PASS  (H): Data is abnormally high   Transfer to step down - discussed with E Ouma NP with PCCM great possibility need for intubation due to aspiration Patient transferred - care assumed by PCCM with intubation      Kip Peon NP [facility] Cross Cover 7pm-7am - check amion for availability Pager [phone]

## 2023-10-05 NOTE — Assessment & Plan Note (Signed)
 PPI ?

## 2023-10-05 NOTE — Progress Notes (Signed)
 Elink monitoring for the code sepsis protocol.

## 2023-10-05 NOTE — Consult Note (Incomplete)
 NAME:  Robert Vega, MRN:  [id number], DOB:  1940-07-10, LOS: 0 ADMISSION DATE:  10/05/2023, CONSULTATION DATE:  5/10 REFERRING MD:  Elisabeth Guild NP  REASON FOR CONSULT:  Acute respiratory distress   HPI  83 y.o male with significant PMH of  OSA, GERD, Obesity, HTN, who presented to the ED with chief complaints of    ED Course: Initial vital signs showed HR of beats/minute, BP mm Hg, the RR 30 breaths/minute, and the oxygen saturation % on and a temperature of 98.68F (36.9C). Patient was lethargic, moaned intermittently, and responded with one-word answers; symmetric movement in the arms and legs was observed. Pertinent Labs/Diagnostics Findings: Na+/ K+:  Glucose: BUN/Cr.:Calcium:  AST/ALT: WBC: K/L without bands or neutrophil predominance   Hgb/Hct: Plts:  PCT: negative <0.10  Lactic acid:  COVID PCR: Negative,  troponin:   BNP:   ABG: pO2 ***; pCO2 ***; pH ***;  HCO3 ***, %O2 Sat ***.  CXR> CTH> CTA Chest> CT Abd/pelvis> Medication administered in the ED: Disposition:  Past Medical History  ***  Significant Hospital Events   ***  Consults:  ***  Procedures:  ***  Significant Diagnostic Tests:  : Chest Xray>  : Noncontrast CT head>  : CTA Chest, abdomen and pelvis>  Interim History / Subjective:      Micro Data:  : SARS-CoV-2 PCR> negative : Influenza PCR> negative : Blood culture x2> : Urine Culture> : MRSA PCR>>  : Strep pneumo urinary antigen> : Legionella urinary antigen>  Antimicrobials:  Vancomycin Cefepime Azithromycin Ceftriaxone Metronidazole  OBJECTIVE   Blood pressure (!) 107/59, pulse 98, temperature 98.5 F (36.9 C), resp. rate (!) 28, height 5\' 9"  (1.753 m), weight (!) 138.6 kg, SpO2 99%.    Vent Mode: PRVC FiO2 (%):  [50 %-100 %] 70 % Set Rate:  [18 bmp] 18 bmp Vt Set:  [500 mL] 500 mL PEEP:  [5 cmH20-8 cmH20] 8 cmH20   Intake/Output Summary (Last 24 hours) at 10/05/2023 2319 Last data filed at 10/05/2023 1832 Gross  per 24 hour  Intake 1066.13 ml  Output 410 ml  Net 656.13 ml   Filed Weights   10/05/23 0457  Weight: (!) 138.6 kg     Physical Examination  GENERAL:  year-old critically ill patient lying in the bed  EYES: PEERLA. No scleral icterus. Extraocular muscles intact.  HEENT: Head atraumatic, normocephalic. Oropharynx and nasopharynx clear.  NECK:  No JVD, supple  LUNGS: Normal breath sounds bilaterally.  No use of accessory muscles of respiration.  CARDIOVASCULAR: S1, S2 normal. No murmurs, rubs, or gallops.  ABDOMEN: Soft, NTND EXTREMITIES: No swelling or erythema.  Capillary refill < 3 seconds in all extremities. Pulses palpable distally. NEUROLOGIC: The patient is . No focal neurological deficit appreciated. Cranial nerves are intact.  SKIN: No obvious rash, lesion, or ulcer. Warm to touch Labs/imaging that I {ACTIONS; HAVE/HAVE NOT:19434}personally reviewed  (right click and "Reselect all SmartList Selections" daily)     Labs   CBC: Recent Labs  Lab 10/05/23 0506  WBC 7.7  NEUTROABS 7.1  HGB 12.0*  HCT 35.5*  MCV 98.1  PLT 216    Basic Metabolic Panel: Recent Labs  Lab 10/05/23 0506  NA 137  K 3.5  CL 99  CO2 26  GLUCOSE 152*  BUN 21  CREATININE 1.00  CALCIUM 8.5*   GFR: Estimated Creatinine Clearance: 76.1 mL/min (by C-G formula based on SCr of 1 mg/dL). Recent Labs  Lab 10/05/23 [phone] 10/05/23 0654 10/05/23 1191  WBC 7.7  --   --   LATICACIDVEN  --  2.2* 1.7    Liver Function Tests: Recent Labs  Lab 10/05/23 0506  AST 28  ALT 16  ALKPHOS 64  BILITOT 1.3*  PROT 7.4  ALBUMIN 3.8   Recent Labs  Lab 10/05/23 0506  LIPASE 23   No results for input(s): "AMMONIA" in the last 168 hours.  ABG    Component Value Date/Time   PHART 7.38 10/05/2023 2111   PCO2ART 48 10/05/2023 2111   PO2ART 97 10/05/2023 2111   HCO3 28.4 (H) 10/05/2023 2111   ACIDBASEDEF 0.3 10/05/2023 1830   O2SAT 99.4 10/05/2023 2111     Coagulation Profile: Recent  Labs  Lab 10/05/23 0506  INR 1.1    Cardiac Enzymes: No results for input(s): "CKTOTAL", "CKMB", "CKMBINDEX", "TROPONINI" in the last 168 hours.  HbA1C: No results found for: "HGBA1C"  CBG: Recent Labs  Lab 10/05/23 2126  GLUCAP 133*    Review of Systems:   ***  Past Medical History  He,  has a past medical history of Allergy, Arthritis, BPH (benign prostatic hypertrophy), Colonic polyp, GERD (gastroesophageal reflux disease), and Hypertension.   Surgical History    Past Surgical History:  Procedure Laterality Date   CATARACT EXTRACTION     2011, other in 2013   COLONOSCOPY  2011   ESOPHAGOGASTRODUODENOSCOPY (EGD) WITH PROPOFOL  N/A 08/30/2020   Procedure: ESOPHAGOGASTRODUODENOSCOPY (EGD) WITH PROPOFOL ;  Surgeon: Luke Salaam, MD;  Location: [facility] ENDOSCOPY;  Service: Gastroenterology;  Laterality: N/A;   ESOPHAGOGASTRODUODENOSCOPY (EGD) WITH PROPOFOL  N/A 04/02/2022   Procedure: ESOPHAGOGASTRODUODENOSCOPY (EGD) WITH PROPOFOL ;  Surgeon: Luke Salaam, MD;  Location: [facility] ENDOSCOPY;  Service: Gastroenterology;  Laterality: N/A;   skin cancer removal       Social History   reports that he quit smoking about 8 years ago. His smoking use included pipe and cigarettes. He started smoking about 59 years ago. He has a 100 pack-year smoking history. He has been exposed to tobacco smoke. He has never used smokeless tobacco. He reports that he does not drink alcohol and does not use drugs.   Family History   His family history includes Hypertension in his mother. There is no history of Heart disease, Diabetes, or Cancer.   Allergies Allergies  Allergen Reactions   Sulfonamide Derivatives      Home Medications  Prior to Admission medications   Medication Sig Start Date End Date Taking? Authorizing Provider  fluticasone (FLONASE) 50 MCG/ACT nasal spray Place 2 sprays into both nostrils daily. 09/11/23  Yes [provider]  acetaminophen  (TYLENOL ) 650 MG CR tablet Take 650 mg  by mouth every morning.    [provider]  aluminum hydroxide-magnesium carbonate (GAVISCON) 95-358 MG/15ML SUSP Take by mouth.    [provider]  amLODipine  (NORVASC ) 2.5 MG tablet Take 1 tablet by mouth daily. 09/05/22   [provider]  aspirin  81 MG tablet Take 81 mg by mouth daily.    [provider]  diltiazem  (CARDIZEM  CD) 120 MG 24 hr capsule Take 120 mg by mouth daily. 07/29/23 07/28/24  [provider]  docusate sodium (COLACE) 50 MG capsule Take 50 mg by mouth daily as needed for moderate constipation or mild constipation.    [provider]  GLUCOSAMINE-CHONDROITIN-MSM PO Take by mouth.    [provider]  lansoprazole  (PREVACID ) 30 MG capsule TAKE ONE CAPSULE BY MOUTH EVERY NIGHT AT BEDTIME 02/25/23   Helaine Llanos, MD  Multiple Vitamins-Minerals (MULTIVITAMIN  WITH MINERALS) tablet Take 1 tablet by mouth daily.    [provider]  tadalafil  (CIALIS ) 20 MG tablet TAKE 1/2 TO 1 TABLET BY MOUTH EVERY OTHER DAY AS NEEDED FOR ERECTILE DYSFUNCTION 09/20/22   Helaine Llanos, MD      Active Hospital Problem list   See systems below  Assessment & Plan:      Best practice:  Diet:  {FAOZ:30865} Pain/Anxiety/Delirium protocol (if indicated): {Pain/Anxiety/Delirium:26941} VAP protocol (if indicated): {VAP:29640} DVT prophylaxis: {DVT Prophylaxis:26933} GI prophylaxis: {HQ:46962} Glucose control:  {Glucose Control:26935} Central venous access:  {Central Venous Access:26936} Arterial line:  {Central Venous Access:26936} Foley:  {Central Venous Access:26936} Mobility:  {Mobility:26937}  PT consulted: {PT Consult:26938} Last date of multidisciplinary goals of care discussion [***] Code Status:  {Code Status:26939} Disposition: ***   = Goals of Care = Code Status Order: @CODE @   Primary Emergency Contact: chrismon,ernistine Wishes to pursue full aggressive treatment and intervention options, including CPR and  intubation, but goals of care will be addressed on going with family if that should become necessary. Patient wishes to pursue ongoing treatment with relatively conservative measures (e.g., IV fluids, antibiotics), but would not wish to escalate to ICU level of care or other invasive measures. Patient wishes to pursue ongoing treatment, but concurred that if deteriorated to pulselessness, patient would prefer a natural death as opposed to invasive measures such as CPR and intubation.  Family should be immediately contacted in such situations.  Critical care time: 45 minutes        Alonza Arthurs DNP, CCRN, FNP-C, AGACNP-BC Acute Care & Family Nurse Practitioner [facility] PCCM on call pager [phone]

## 2023-10-05 NOTE — Assessment & Plan Note (Addendum)
 Pneumonia Patient now on room air

## 2023-10-05 NOTE — Progress Notes (Signed)
 Patient update: I found patient in hallway shortly after assessing him on floor. He was walking around with security with him and CT transporter. Patient apparently became disoriented and got out of bed. Patient noted to be off oxygen and would not allow me to put him back on cannula. Staff was able to redirect him to go back to his room. He refused to get back in the bed. Security and Database administrator assisted patient throughout his walk to second floor. Patient was able to get to hallway bench. O2 sat at that time was 72%. He initially would not let me reapply his o2 but after redirection of events I was able to place him back on 6 liters and o2 sat increased to 92. Patient was then transported down to CT where apparently he became distressed. When I arrived with nonrebreather his saturation had increased to 92 on 6L. He was taken back to his room, placed on nonrebreather due to aspiration and abg was drawn.

## 2023-10-05 NOTE — ED Notes (Signed)
 Patient denies having to pee at this time. Urinal at bedside.

## 2023-10-05 NOTE — Evaluation (Signed)
 Clinical/Bedside Swallow Evaluation Patient Details  Name: Robert Vega MRN: [id number] Date of Birth: 09/30/1940  Today's Date: 10/05/2023 Time: SLP Start Time (ACUTE ONLY): 1100 SLP Stop Time (ACUTE ONLY): 1130 SLP Time Calculation (min) (ACUTE ONLY): 30 min  Past Medical History:  Past Medical History:  Diagnosis Date   Allergy    Arthritis    BPH (benign prostatic hypertrophy)    Colonic polyp    GERD (gastroesophageal reflux disease)    Has LPR   Hypertension    Past Surgical History:  Past Surgical History:  Procedure Laterality Date   CATARACT EXTRACTION     2011, other in 2013   COLONOSCOPY  2011   ESOPHAGOGASTRODUODENOSCOPY (EGD) WITH PROPOFOL  N/A 08/30/2020   Procedure: ESOPHAGOGASTRODUODENOSCOPY (EGD) WITH PROPOFOL ;  Surgeon: Luke Salaam, MD;  Location: [facility] ENDOSCOPY;  Service: Gastroenterology;  Laterality: N/A;   ESOPHAGOGASTRODUODENOSCOPY (EGD) WITH PROPOFOL  N/A 04/02/2022   Procedure: ESOPHAGOGASTRODUODENOSCOPY (EGD) WITH PROPOFOL ;  Surgeon: Luke Salaam, MD;  Location[facility] ENDOSCOPY;  Service: Gastroenterology;  Laterality: N/A;   skin cancer removal     HPI:  Robert Vega is a 83 y.o. male with medical history significant of hypertension, sleep apnea, obesity, GERD presenting with acute febrile hypoxia, sepsis  and pneumonia.  Patient noted to be overall poor historian.  Per report, patient with increased work of breathing generalized weakness over the past 12 to 24 hours.  Was initially evaluated urgent care however EMS had to be called.  No severe weakness.  Mild cough per report.  No chest pain or abdominal pain.  No reported nausea or vomiting.  Seen by EMS with noted fever 100.7 and route.  Hypoxic to the mid 80s on room air. CXR 10/05/23: Bilateral lower lobe airspace opacities, right greater than left. Findings are concerning for pneumonia. Pulmonary vascular congestion.    Assessment / Plan / Recommendation  Clinical Impression  Pt seen for bedside  swallow assessment in the setting of concern for aspiration PNA. Pt with recent OP MBSS on 06/05/23, revealing, "functional oropharyngeal swallowing for age." Pt with known GI/esophageal dysphagia, c/b EGD 03/2022 with food in upper esophagus complicated by aspiration event, cardiac arrest with round of CPR, and post resuscitation EGD with concern for lack of peristalsis. Prior EGD 08/2020 with note of abnormal cricopharyngeus, decrease in motility in esophagus, and spastic LES." Pt reporting increased coughing when laying down during office visit on 05/13/23, with GI reporting concern for pt's ability to clear esophageal contents.       Pt seen today with trials of thin liquids (via straw), puree, and regular solids. No overt or subtle s/sx pharyngeal dysphagia noted. No change to vocal quality across trials. Vitals stable for duration of trials (on 6L nasal canula with O2 saturations maintained at 94-97). Oral phase grossly intact- with complete manipulation and clearance of regular solid from oral cavity. Pt with persistent belching during assessment. RN reporting similar belching with additional emesis following meal.       Based on age, known GI concerns,  and current debility, pt is at increased risk of aspiration, specifically postprandrial aspiration, therefore recommend aspiration/esophageal precautions: slow rate of intake, smaller more frequent meals, follow bites of food with liquids, remain upright for 30-60 minutes after eating, do not eat/drink 2-3 hours before bedtime except water, elevate head of bed at least 30 degrees Continue with current unrestricted diet- with pt's reported plan to avoid triggering/challenging food (tough meats/bread). SLP will monitor for continued safety with current diet. Recommend esophageal  assessment/GI consult. MD and RN aware of recommendations. SLP Visit Diagnosis: Dysphagia, unspecified (R13.10) (suspect primarily esophageal)    Aspiration Risk  Moderate  aspiration risk    Diet Recommendation   Thin;Age appropriate regular (cut meats and extra sauces)  Medication Administration: Whole meds with liquid (vs with puree to aid clearace)    Other  Recommendations Recommended Consults: Consider GI evaluation;Consider esophageal assessment Oral Care Recommendations: Oral care BID    Recommendations for follow up therapy are one component of a multi-disciplinary discharge planning process, led by the attending physician.  Recommendations may be updated based on patient status, additional functional criteria and insurance authorization.  Follow up Recommendations Follow physician's recommendations for discharge plan and follow up therapies      Assistance Recommended at Discharge  Supervision with PO intake  Functional Status Assessment Patient has had a recent decline in their functional status and demonstrates the ability to make significant improvements in function in a reasonable and predictable amount of time.  Frequency and Duration min 1 x/week  1 week       Prognosis Prognosis for improved oropharyngeal function: Good      Swallow Study   General Date of Onset: 10/05/23 HPI: Robert Vega is a 83 y.o. male with medical history significant of hypertension, sleep apnea, obesity, GERD presenting with acute febrile hypoxia, sepsis  and pneumonia.  Patient noted to be overall poor historian.  Per report, patient with increased work of breathing generalized weakness over the past 12 to 24 hours.  Was initially evaluated urgent care however EMS had to be called.  No severe weakness.  Mild cough per report.  No chest pain or abdominal pain.  No reported nausea or vomiting.  Seen by EMS with noted fever 100.7 and route.  Hypoxic to the mid 80s on room air. CXR 10/05/23: Bilateral lower lobe airspace opacities, right greater than left. Findings are concerning for pneumonia. Pulmonary vascular congestion. Type of Study: Bedside Swallow  Evaluation Previous Swallow Assessment: MBSS 06/05/23 Diet Prior to this Study: Regular;Thin liquids (Level 0) Temperature Spikes Noted: Yes (WBC 7.7) Respiratory Status: Nasal cannula (6L) History of Recent Intubation: No Behavior/Cognition: Alert;Cooperative;Confused Oral Cavity Assessment: Within Functional Limits Oral Care Completed by SLP: Yes Oral Cavity - Dentition: Poor condition;Missing dentition Vision: Functional for self-feeding Self-Feeding Abilities: Able to feed self Patient Positioning: Upright in bed Baseline Vocal Quality: Normal Volitional Cough: Strong Volitional Swallow: Unable to elicit    Oral/Motor/Sensory Function Overall Oral Motor/Sensory Function: Within functional limits   Ice Chips Ice chips: Not tested   Thin Liquid Thin Liquid: Within functional limits Presentation: Straw;Self Fed    Nectar Thick Nectar Thick Liquid: Not tested   Honey Thick Honey Thick Liquid: Not tested   Puree Puree: Within functional limits Presentation: Spoon;Self Fed   Solid     Solid: Within functional limits Presentation: Self Fed     [place] Abrahm Mancia Clapp, MS, CCC-SLP Speech Language Pathologist Rehab Services; [facility] [place] [facility] [phone] (ascom)   [place] J Clapp 10/05/2023,2:17 PM

## 2023-10-05 NOTE — ED Notes (Signed)
 Attempted to call family and no response. Called patietns phone number that is with the family member and no response.

## 2023-10-05 NOTE — Progress Notes (Addendum)
 Patient with noted worsening Rales on exam after eating Some concern for possible aspiration event Will make n.p.o. Start DuoNebs Currently on a nonrebreather Will obtain CT of the chest to better assess GI also consulted to further evaluate VBG x 1 IV Lasix x 1 given vascular congestion on prior imaging RT to assess  Low threshold for BiPAP Attempted to contact patient's friend Jeanenne Mile at [phone] over the phone-however, phone call was unable to be put through despite multiple attempts Will otherwise continue to monitor closely

## 2023-10-05 NOTE — Assessment & Plan Note (Deleted)
 Meeting sepsis criteria with temperature 102, heart rate 90s Noted multifocal pneumonia on chest x-ray Lactate 2.2 Panculture IV Rocephin and azithromycin  for respiratory coverage Minimal IV fluids with concern for vascular congestion on chest x-ray Otherwise monitor

## 2023-10-05 NOTE — ED Provider Notes (Signed)
 [facility] Provider Note    Event Date/Time   First MD Initiated Contact with Patient 10/05/23 [phone]     (approximate)   History   Weakness   HPI Robert Vega is a 83 y.o. male whose medical history is notable for cardiac arrest during EGD, history of dysphagia, but no known other heart or lung issues.  He presents by EMS from an urgent care parking lot.  Reportedly the patient's wife was driving him to the emergency department but she had to pull over because the patient was extremely weak all of the sudden.  History is limited right now, but EMS reports that the patient has been ill for a couple of days.  He was found to be febrile by EMS and his oxygen saturation was reportedly in the mid 80s so he was started on 6 L of oxygen by nasal cannula.  The patient is awake and alert but somewhat somnolent.  He says "I have been better" when asked how he is doing.  He said he feels "a little bit" short of breath.  He denies any pain.  He said that it has been stinging when he urinates recently.  He denies knowing that he had a fever and he denies having chills.  He denies chest pain and abdominal pain.     Physical Exam   Triage Vital Signs: ED Triage Vitals  Encounter Vitals Group     BP 10/05/23 0503 110/82     Systolic BP Percentile --      Diastolic BP Percentile --      Pulse Rate 10/05/23 0453 92     Resp 10/05/23 0453 16     Temp 10/05/23 0453 (!) 102.1 F (38.9 C)     Temp Source 10/05/23 0453 Rectal     SpO2 10/05/23 0452 (!) 84 %     Weight 10/05/23 0457 (!) 138.6 kg (305 lb 8.9 oz)     Height 10/05/23 0457 1.753 m (5\' 9" )     Head Circumference --      Peak Flow --      Pain Score 10/05/23 0457 0     Pain Loc --      Pain Education --      Exclude from Growth Chart --     Most recent vital signs: Vitals:   10/05/23 0630 10/05/23 0703  BP: 135/67   Pulse: 78   Resp: (!) 28   Temp:  100 F (37.8 C)  SpO2: 95%      General: Ill-appearing but nontoxic.  Awake and alert and answering questions. CV:  Good peripheral perfusion.  Heart rate greater than 90, regular rhythm. Resp:  Normal effort. Speaking easily and comfortably, no accessory muscle usage nor intercostal retractions.  Has some tachypnea, lung sounds are clear, no wheezing.  Room air oxygen saturation was 84%, up to low 90s on 4 L. Abd:  No distention.  No tenderness to palpation of the abdomen.   ED Results / Procedures / Treatments   Labs (all labs ordered are listed, but only abnormal results are displayed) Labs Reviewed  LACTIC ACID, PLASMA - Abnormal; Notable for the following components:      Result Value   Lactic Acid, Venous 2.2 (*)    All other components within normal limits  COMPREHENSIVE METABOLIC PANEL WITH GFR - Abnormal; Notable for the following components:   Glucose, Bld 152 (*)    Calcium 8.5 (*)    Total  Bilirubin 1.3 (*)    All other components within normal limits  CBC WITH DIFFERENTIAL/PLATELET - Abnormal; Notable for the following components:   RBC 3.62 (*)    Hemoglobin 12.0 (*)    HCT 35.5 (*)    Lymphs Abs 0.3 (*)    All other components within normal limits  BLOOD GAS, VENOUS - Abnormal; Notable for the following components:   Bicarbonate 29.6 (*)    Acid-Base Excess 3.4 (*)    All other components within normal limits  URINALYSIS, W/ REFLEX TO CULTURE (INFECTION SUSPECTED) - Abnormal; Notable for the following components:   Color, Urine YELLOW (*)    APPearance HAZY (*)    Hgb urine dipstick SMALL (*)    Ketones, ur 20 (*)    Protein, ur 30 (*)    All other components within normal limits  RESP PANEL BY RT-PCR (RSV, FLU A&B, COVID)  RVPGX2  CULTURE, BLOOD (ROUTINE X 2)  CULTURE, BLOOD (ROUTINE X 2)  PROTIME-INR  BRAIN NATRIURETIC PEPTIDE  LIPASE, BLOOD  LACTIC ACID, PLASMA  TROPONIN I (HIGH SENSITIVITY)     EKG  ED ECG REPORT I, Lynnda Sas, the attending physician, personally viewed  and interpreted this ECG.  Date: 10/05/2023 EKG Time: 4:54 AM Rate: 88 Rhythm: normal sinus rhythm QRS Axis: normal Intervals: normal ST/T Wave abnormalities: Non-specific ST segment / T-wave changes, but no clear evidence of acute ischemia. Narrative Interpretation: no definitive evidence of acute ischemia; does not meet STEMI criteria.    RADIOLOGY See ED course for details regarding imaging   PROCEDURES:  Critical Care performed: Yes, see critical care procedure note(s)  .Critical Care  Performed by: Lynnda Sas, MD Authorized by: Lynnda Sas, MD   Critical care provider statement:    Critical care time (minutes):  45   Critical care time was exclusive of:  Separately billable procedures and treating other patients   Critical care was necessary to treat or prevent imminent or life-threatening deterioration of the following conditions:  Sepsis   Critical care was time spent personally by me on the following activities:  Development of treatment plan with patient or surrogate, evaluation of patient's response to treatment, examination of patient, obtaining history from patient or surrogate, ordering and performing treatments and interventions, ordering and review of laboratory studies, ordering and review of radiographic studies, pulse oximetry, re-evaluation of patient's condition and review of old charts .1-3 Lead EKG Interpretation  Performed by: Lynnda Sas, MD Authorized by: Lynnda Sas, MD     Interpretation: normal     ECG rate:  92   ECG rate assessment: normal     Rhythm: sinus rhythm     Ectopy: none     Conduction: normal       IMPRESSION / MDM / ASSESSMENT AND PLAN / ED COURSE  I reviewed the triage vital signs and the nursing notes.                              Differential diagnosis includes, but is not limited to, sepsis, pneumonia, urinary tract infection, bacteremia, acute intra-abdominal infection, PE, ACS.  Patient's presentation is most  consistent with acute presentation with potential threat to life or bodily function.  Labs/studies ordered: I ordered standard sepsis labs/studies including the following: respiratory viral panel PCR swab, blood cultures x2, pro time-INR, CMP, urinalysis, urine culture, lactic acid, CBC with differential, high-sensitivity troponin, lipase, 1-view chest x-ray, EKG. also ordered VBG  Interventions/Medications given:  Medications  azithromycin (ZITHROMAX) 500 mg in sodium chloride  0.9 % 250 mL IVPB (500 mg Intravenous New Bag/Given 10/05/23 0655)  metroNIDAZOLE (FLAGYL) IVPB 500 mg (has no administration in time range)  sodium chloride  0.9 % bolus 1,500 mL (has no administration in time range)  acetaminophen  (TYLENOL ) tablet 1,000 mg (1,000 mg Oral Given 10/05/23 0558)  lactated ringers bolus 1,000 mL (1,000 mLs Intravenous New Bag/Given 10/05/23 0557)  cefTRIAXone (ROCEPHIN) 2 g in sodium chloride  0.9 % 100 mL IVPB (0 g Intravenous Stopped 10/05/23 0650)    (Note:  hospital course my include additional interventions and/or labs/studies not listed above.)   Patient is likely febrile, heart rate greater than 90.  Will order "probable sepsis" labs.  Patient is hypoxic but protecting his airway and doing better with supplemental oxygen.  Anticipate admission but will reassess as workup continues.  No chest pain and no abdominal pain at this time which is reassuring.  The patient is on the cardiac monitor to evaluate for evidence of arrhythmia and/or significant heart rate changes.   Clinical Course as of 10/05/23 0714  Sat Oct 05, 2023  0530 Blood gas, venous(!) Reassuring VBG [CF]  0530 Temp(!): 102.1 F (38.9 C) Ordered 1 g Tylenol  by mouth [CF]  0530 DG Chest [facility] I viewed and interpreted the patient's chest x-ray and he has extensive opacities in the lower and middle lobes, mostly lower lobes.  Given the presence of his hypoxia and fever, I am making him a code sepsis and initiating  empiric treatment for community acquired pneumonia.  However, he has a history of dysphagia as well and I will consider covering for possible aspiration. [CF]  0533 Treating with ceftriaxone 2 g IV and azithromycin 500 mg IV.   [CF]  0543 CBC with Differential(!) No leukocytosis [CF]  0608 Comprehensive metabolic panel(!) Normal CMP [CF]  0609 His wife is at bedside.  She provided the additional details that he has had a worsening cough over the last 3 days.  She confirmed his history of dysphagia and said that he throws up a lot and that aspiration could be fairly likely.  Though in general treatment for community-acquired pneumonia should be sufficient, I am going to add a dose of metronidazole 500 mg IV to treat for enteric organisms given his strong history of dysphagia, vomiting, and probable aspiration. [CF]  0610 Of note, I reviewed his medical record and saw a note from Dr. Antony Baumgartner from 2023 when the patient had an endoscopy and had so much impacted food and probable aspiration that he had a cardiopulmonary arrest while getting the endoscopy. [CF]  [phone] Urinalysis, w/ Reflex to Culture (Infection Suspected) -Urine, Catheterized(!) Possible UTI but unlikely based on the results, only a bit of hemoglobinuria or hematuria but no obvious infection. [CF]  0614 B Natriuretic Peptide: 70.6 No suggestion of CHF [CF]  0623 Lactic Acid, Venous(!!): 2.2 Given that the patient's lactic acid is greater than 2, will continue with fluid resuscitation with a target of 30 mL/kg of IV fluids based on ideal body weight.  Based on that criterion, he should receive a total of 2.2 L.  He has received 1 L already, so I ordered an additional 1.5 L of normal saline, which will exceed the ideal body weight goal. [CF]  9604 Sepsis reassessment completed, patient is more awake and alert and oriented.  I updated him and his wife about the plan for admission, the results, the pneumonia, etc.  They understand and agree with the  plan. [CF]  1610 Consulted Dr. Vallarie Gauze with the hospitalist service.  She will place admission orders and pass along the admission to her daytime colleague. [CF]  [phone] Of note, official radiology report of chest x-ray was not available by the time I signed my note.  Patient has been admitted. [CF]    Clinical Course User Index [CF] Lynnda Sas, MD     FINAL CLINICAL IMPRESSION(S) / ED DIAGNOSES   Final diagnoses:  Severe sepsis (HCC)  Multifocal pneumonia  Aspiration into respiratory tract, initial encounter  Fever, unspecified fever cause  History of dysphagia     Rx / DC Orders   ED Discharge Orders     None        Note:  This document was prepared using Dragon voice recognition software and may include unintentional dictation errors.   Lynnda Sas, MD 10/05/23 [phone]

## 2023-10-05 NOTE — Assessment & Plan Note (Addendum)
 Continue Norvasc

## 2023-10-05 NOTE — ED Notes (Signed)
 RN placed patient on Non-re breather while sleeping. Patients oxygen saturation dropped into 80s when he was sleeping. RN placed patient on non-re breather and while patient is sleeping his oxygen saturation is maintaining in 90s.

## 2023-10-05 NOTE — Progress Notes (Signed)
 Patient on nrb mask from ER. O2 sat noted at 100% HR 82 RR 22. Patient awake and able to verbalize. Placed patient on 6L

## 2023-10-05 NOTE — Procedures (Cosign Needed Addendum)
 INTUBATION PROCEDURE NOTE  Robert Vega  [id number]  24-Apr-1941  Date:10/05/23  Time:11:32 PM   Provider Performing:Floria Brandau A Jacqualynn Parco   Procedure: Intubation (31500)  Indication(s) Acute Respiratory Failure due to Aspiration Event  Consent Unable to obtain consent due to emergent nature of procedure.  Anesthesia Etomidate, Fentanyl, and Rocuronium   Time Out Verified patient identification, verified procedure, site/side was marked, verified correct patient position, special equipment/implants available, medications/allergies/relevant history reviewed, required imaging and test results available.  Sterile Technique Usual hand hygeine, masks, and gloves were used  Procedure Description Patient positioned in bed supine.  Sedation given as noted above.  Patient was intubated with endotracheal tube using Glidescope.  View was Grade 1 full glottis .  Number of attempts was 1.  ETT size 7.5 taped 25c m @ the lip Colorimetric CO2 detector was consistent with tracheal placement.  Complications/Tolerance Difficulty airway anticipated due to history of failed airway in 2023 during EGD complicated by PEA cardiac arrest. Discussed with on call Anesthesia Dr. Paz Bott who was available for assistance in the event of a difficult airway None; patient tolerated the procedure well. Chest X-ray is ordered to verify placement.  EBL Minimal  Specimen(s) None   Alonza Arthurs, DNP, CCRN, FNP-C, AGACNP-BC Acute Care & Family Nurse Practitioner  [facility]  See Amion for personal pager PCCM on call pager [phone] until 7 am

## 2023-10-05 NOTE — Significant Event (Signed)
       CROSS COVER NOTE  NAME: Robert Vega MRN: [id number] DOB : 1941-02-09 ATTENDING PHYSICIAN: Corrinne Din, MD    Date of Service   10/05/2023   HPI/Events of Note   Message received from day MD patient with desat event post vomiting episode  Interventions   Assessment/Plan: Sats 100% with NRB in place.  Respirations non labored. Patietn endorses feeling anxious secondary to prio experience in hospital and able to relate events to me He is following commands and oriented to self and place,  HHF oxygen requested from respiratory        Kip Peon NP [facility] Cross Cover 7pm-7am - check amion for availability Pager [phone]

## 2023-10-05 NOTE — ED Notes (Signed)
 Patient has had another episode of vomiting after attempting to eat dinner.

## 2023-10-06 ENCOUNTER — Inpatient Hospital Stay
Admit: 2023-10-06 | Discharge: 2023-10-06 | Disposition: A | Discharge status: Home / Self Care | Attending: Family Medicine | Admitting: Family Medicine

## 2023-10-06 DIAGNOSIS — Z515 Encounter for palliative care: Secondary | ICD-10-CM | POA: Diagnosis not present

## 2023-10-06 DIAGNOSIS — I509 Heart failure, unspecified: Secondary | ICD-10-CM | POA: Diagnosis not present

## 2023-10-06 DIAGNOSIS — G9341 Metabolic encephalopathy: Secondary | ICD-10-CM | POA: Diagnosis not present

## 2023-10-06 DIAGNOSIS — R6520 Severe sepsis without septic shock: Secondary | ICD-10-CM

## 2023-10-06 DIAGNOSIS — J9601 Acute respiratory failure with hypoxia: Secondary | ICD-10-CM | POA: Diagnosis not present

## 2023-10-06 DIAGNOSIS — N471 Phimosis: Secondary | ICD-10-CM

## 2023-10-06 DIAGNOSIS — J690 Pneumonitis due to inhalation of food and vomit: Secondary | ICD-10-CM | POA: Diagnosis not present

## 2023-10-06 DIAGNOSIS — K219 Gastro-esophageal reflux disease without esophagitis: Secondary | ICD-10-CM | POA: Diagnosis not present

## 2023-10-06 DIAGNOSIS — A419 Sepsis, unspecified organism: Secondary | ICD-10-CM | POA: Diagnosis not present

## 2023-10-06 DIAGNOSIS — Z87898 Personal history of other specified conditions: Secondary | ICD-10-CM

## 2023-10-06 DIAGNOSIS — J9602 Acute respiratory failure with hypercapnia: Secondary | ICD-10-CM | POA: Diagnosis not present

## 2023-10-06 DIAGNOSIS — I10 Essential (primary) hypertension: Secondary | ICD-10-CM | POA: Diagnosis not present

## 2023-10-06 DIAGNOSIS — R933 Abnormal findings on diagnostic imaging of other parts of digestive tract: Secondary | ICD-10-CM | POA: Diagnosis not present

## 2023-10-06 DIAGNOSIS — R6521 Severe sepsis with septic shock: Secondary | ICD-10-CM | POA: Diagnosis not present

## 2023-10-06 DIAGNOSIS — T17908A Unspecified foreign body in respiratory tract, part unspecified causing other injury, initial encounter: Secondary | ICD-10-CM

## 2023-10-06 DIAGNOSIS — K220 Achalasia of cardia: Secondary | ICD-10-CM | POA: Diagnosis not present

## 2023-10-06 DIAGNOSIS — J189 Pneumonia, unspecified organism: Secondary | ICD-10-CM | POA: Diagnosis not present

## 2023-10-06 LAB — COMPREHENSIVE METABOLIC PANEL WITH GFR
ALT: 17 U/L (ref 0–44)
ALT: 16 U/L (ref 0–44)
AST: 33 U/L (ref 15–41)
AST: 30 U/L (ref 15–41)
Albumin: 3.1 g/dL — ABNORMAL LOW (ref 3.5–5.0)
Albumin: 3.1 g/dL — ABNORMAL LOW (ref 3.5–5.0)
Alkaline Phosphatase: 50 U/L (ref 38–126)
Alkaline Phosphatase: 53 U/L (ref 38–126)
Anion gap: 10 (ref 5–15)
Anion gap: 10 (ref 5–15)
BUN: 18 mg/dL (ref 8–23)
BUN: 18 mg/dL (ref 8–23)
CO2: 26 mmol/L (ref 22–32)
CO2: 25 mmol/L (ref 22–32)
Calcium: 7.8 mg/dL — ABNORMAL LOW (ref 8.9–10.3)
Calcium: 7.7 mg/dL — ABNORMAL LOW (ref 8.9–10.3)
Chloride: 101 mmol/L (ref 98–111)
Chloride: 101 mmol/L (ref 98–111)
Creatinine, Ser: 0.95 mg/dL (ref 0.61–1.24)
Creatinine, Ser: 0.84 mg/dL (ref 0.61–1.24)
GFR, Estimated: >60 mL/min (ref >60)
GFR, Estimated: >60 mL/min (ref >60)
Glucose, Bld: 126 mg/dL — ABNORMAL HIGH (ref 70–99)
Glucose, Bld: 119 mg/dL — ABNORMAL HIGH (ref 70–99)
Potassium: 3.9 mmol/L (ref 3.5–5.1)
Potassium: 3.5 mmol/L (ref 3.5–5.1)
Sodium: 137 mmol/L (ref 135–145)
Sodium: 136 mmol/L (ref 135–145)
Total Bilirubin: 1.1 mg/dL (ref 0.0–1.2)
Total Bilirubin: 0.8 mg/dL (ref 0.0–1.2)
Total Protein: 6.6 g/dL (ref 6.5–8.1)
Total Protein: 6.7 g/dL (ref 6.5–8.1)

## 2023-10-06 LAB — GLUCOSE, CAPILLARY
Glucose-Capillary: 112 mg/dL — ABNORMAL HIGH (ref 70–99)
Glucose-Capillary: 111 mg/dL — ABNORMAL HIGH (ref 70–99)
Glucose-Capillary: 119 mg/dL — ABNORMAL HIGH (ref 70–99)
Glucose-Capillary: 128 mg/dL — ABNORMAL HIGH (ref 70–99)
Glucose-Capillary: 124 mg/dL — ABNORMAL HIGH (ref 70–99)
Glucose-Capillary: 120 mg/dL — ABNORMAL HIGH (ref 70–99)

## 2023-10-06 LAB — ECHOCARDIOGRAM COMPLETE
Area-P 1/2: 2.35 cm2
Height: 69 in
S' Lateral: 3.2 cm
Weight: 3798.97 [oz_av]

## 2023-10-06 LAB — CBC
HCT: 32 % — ABNORMAL LOW (ref 39.0–52.0)
HCT: 32.6 % — ABNORMAL LOW (ref 39.0–52.0)
Hemoglobin: 10.7 g/dL — ABNORMAL LOW (ref 13.0–17.0)
Hemoglobin: 10.7 g/dL — ABNORMAL LOW (ref 13.0–17.0)
MCH: 32.4 pg (ref 26.0–34.0)
MCH: 32.9 pg (ref 26.0–34.0)
MCHC: 32.8 g/dL (ref 30.0–36.0)
MCHC: 33.4 g/dL (ref 30.0–36.0)
MCV: 98.5 fL (ref 80.0–100.0)
MCV: 98.8 fL (ref 80.0–100.0)
Platelets: 201 10*3/uL (ref 150–400)
Platelets: 202 10*3/uL (ref 150–400)
RBC: 3.25 MIL/uL — ABNORMAL LOW (ref 4.22–5.81)
RBC: 3.3 MIL/uL — ABNORMAL LOW (ref 4.22–5.81)
RDW: 13.2 % (ref 11.5–15.5)
RDW: 13.4 % (ref 11.5–15.5)
WBC: 12.6 10*3/uL — ABNORMAL HIGH (ref 4.0–10.5)
WBC: 14 10*3/uL — ABNORMAL HIGH (ref 4.0–10.5)
nRBC: 0 % (ref 0.0–0.2)
nRBC: 0 % (ref 0.0–0.2)

## 2023-10-06 LAB — BLOOD GAS, ARTERIAL
Acid-Base Excess: 2.5 mmol/L — ABNORMAL HIGH (ref 0.0–2.0)
Bicarbonate: 29.3 mmol/L — ABNORMAL HIGH (ref 20.0–28.0)
FIO2: 70 %
MECHVT: 500 mL
O2 Saturation: 99.5 %
PEEP: 8 cmH2O
Patient temperature: 37
RATE: 18 {breaths}/min
pCO2 arterial: 53 mmHg — ABNORMAL HIGH (ref 32–48)
pH, Arterial: 7.35 (ref 7.35–7.45)
pO2, Arterial: 94 mmHg (ref 83–108)

## 2023-10-06 LAB — TROPONIN I (HIGH SENSITIVITY)
Troponin I (High Sensitivity): 28 ng/L — ABNORMAL HIGH (ref <18)
Troponin I (High Sensitivity): 21 ng/L — ABNORMAL HIGH (ref <18)

## 2023-10-06 LAB — MRSA NEXT GEN BY PCR, NASAL: MRSA by PCR Next Gen: NOT DETECTED

## 2023-10-06 LAB — TRIGLYCERIDES: Triglycerides: 50 mg/dL (ref <150)

## 2023-10-06 LAB — LACTIC ACID, PLASMA
Lactic Acid, Venous: 1.7 mmol/L (ref 0.5–1.9)
Lactic Acid, Venous: 1.5 mmol/L (ref 0.5–1.9)

## 2023-10-06 LAB — PROCALCITONIN: Procalcitonin: 3.15 ng/mL

## 2023-10-06 MED ORDER — METHYLPREDNISOLONE SODIUM SUCC 40 MG IJ SOLR: 40.0000 mg | INTRAMUSCULAR | Status: DC

## 2023-10-06 MED ORDER — CHLORHEXIDINE GLUCONATE CLOTH 2 % EX PADS
6.0000 | MEDICATED_PAD | Freq: Every day | CUTANEOUS | Status: DC
  Administered 2023-10-06 – 2023-10-20 (×12): 6 via TOPICAL

## 2023-10-06 MED ORDER — METHYLPREDNISOLONE SODIUM SUCC 40 MG IJ SOLR
40.0000 mg | INTRAMUSCULAR | Status: DC
  Administered 2023-10-07 – 2023-10-10 (×4): 40 mg via INTRAVENOUS
  Filled 2023-10-06 (×4): qty 1

## 2023-10-06 MED ORDER — PANTOPRAZOLE SODIUM 40 MG IV SOLR
40.0000 mg | Freq: Two times a day (BID) | INTRAVENOUS | Status: DC
  Administered 2023-10-06: 40 mg via INTRAVENOUS
  Filled 2023-10-06: qty 10

## 2023-10-06 MED ORDER — PANTOPRAZOLE SODIUM 40 MG IV SOLR
40.0000 mg | INTRAVENOUS | Status: DC
  Administered 2023-10-07 – 2023-11-04 (×29): 40 mg via INTRAVENOUS
  Filled 2023-10-06 (×30): qty 10

## 2023-10-06 MED ORDER — PERFLUTREN LIPID MICROSPHERE
1.0000 mL | INTRAVENOUS | Status: AC | PRN
  Administered 2023-10-06: 4 mL via INTRAVENOUS

## 2023-10-06 MED ORDER — PIPERACILLIN-TAZOBACTAM 3.375 G IVPB
3.3750 g | Freq: Three times a day (TID) | INTRAVENOUS | Status: DC
  Administered 2023-10-06: 3.375 g via INTRAVENOUS
  Filled 2023-10-06: qty 50

## 2023-10-06 MED ORDER — LIDOCAINE HCL 1 % IJ SOLN
INTRAMUSCULAR | Status: AC
  Filled 2023-10-06: qty 10

## 2023-10-06 MED ORDER — INSULIN ASPART 100 UNIT/ML IJ SOLN
0.0000 [IU] | INTRAMUSCULAR | Status: DC
  Administered 2023-10-06 – 2023-10-09 (×7): 1 [IU] via SUBCUTANEOUS
  Administered 2023-10-10: 2 [IU] via SUBCUTANEOUS
  Administered 2023-10-10 – 2023-10-15 (×16): 1 [IU] via SUBCUTANEOUS
  Administered 2023-10-16: 2 [IU] via SUBCUTANEOUS
  Administered 2023-10-16 – 2023-10-21 (×10): 1 [IU] via SUBCUTANEOUS
  Administered 2023-10-22: 2 [IU] via SUBCUTANEOUS
  Administered 2023-10-22: 1 [IU] via SUBCUTANEOUS
  Administered 2023-10-22: 2 [IU] via SUBCUTANEOUS
  Administered 2023-10-23 – 2023-10-30 (×21): 1 [IU] via SUBCUTANEOUS
  Administered 2023-10-30: 2 [IU] via SUBCUTANEOUS
  Administered 2023-10-30 – 2023-11-06 (×20): 1 [IU] via SUBCUTANEOUS
  Administered 2023-11-07: 2 [IU] via SUBCUTANEOUS
  Administered 2023-11-07 – 2023-11-10 (×14): 1 [IU] via SUBCUTANEOUS
  Filled 2023-10-06 (×85): qty 1

## 2023-10-06 MED ORDER — SODIUM CHLORIDE 0.9 % IV SOLN
2.0000 g | INTRAVENOUS | Status: AC
  Administered 2023-10-06 – 2023-10-09 (×4): 2 g via INTRAVENOUS
  Filled 2023-10-06 (×4): qty 20

## 2023-10-06 MED ORDER — LIDOCAINE HCL 1 % IJ SOLN
10.0000 mL | Freq: Once | INTRAMUSCULAR | Status: AC
  Administered 2023-10-06: 10 mL

## 2023-10-06 MED ORDER — METHYLPREDNISOLONE SODIUM SUCC 40 MG IJ SOLR
40.0000 mg | Freq: Two times a day (BID) | INTRAMUSCULAR | Status: DC
  Administered 2023-10-06: 40 mg via INTRAVENOUS
  Filled 2023-10-06: qty 1

## 2023-10-06 MED ORDER — NOREPINEPHRINE 4 MG/250ML-% IV SOLN
0.0000 ug/min | INTRAVENOUS | Status: DC
  Administered 2023-10-06: 2 ug/min via INTRAVENOUS
  Filled 2023-10-06: qty 250

## 2023-10-06 NOTE — Consult Note (Signed)
 Consultation Note Date: 10/06/2023   Patient Name: Robert Vega  DOB: [birth year]  MRN: [id number]  Age / Sex: 83 y.o., male  PCP: Helaine Llanos, MD Referring Physician: Annitta Kindler, MD  Reason for Consultation: Establishing goals of care   HPI/Brief Hospital Course: 83 y.o. male  with past medical history of dysphagia (s/p EGD 03/2022 with food impaction in upper esophagus-suffered aspiration and cardiac arrest--concern for lack or peristalsis, OSA and HTN admitted from home on 10/05/2023 with hypoxia, fever and generalized weakness.  Admitted initially to [facility] service, unfortunately overnight suffered likely aspiration event of vomitus--transferred to ICU and required mechanical ventilation  Bronch 5/11  Palliative medicine was consulted for assisting with goals of care conversations.  Subjective:  Extensive chart review has been completed prior to meeting patient including labs, vital signs, imaging, progress notes, orders, and available advanced directive documents from current and previous encounters.  Visited with Robert Vega at his bedside. He remains intubated and sedated. Nursing staff at bedside-unclear on family background. Visitor at [employer]."  Introduced myself as a Publishing rights manager as a member of the palliative care team. Explained palliative medicine is specialized medical care for people living with serious illness. It focuses on providing relief from the symptoms and stress of a serious illness. The goal is to improve quality of life for both the patient and the family.   Robert Vega shares she and Robert Vega have been close friends since 4th grade but went their separate ways as adults. Rekindled their friendship about 5 years ago. Robert Vega shares Robert Vega has two sons-Robert Vega and unable to recall second sons name that live in [place] . Assisted Robert Vega in locating Robert Vega's contact information from Robert Vega emergency  contacts within his personal phone.  Provided Robert Vega with medical updates, he was unaware his father was in the hospital. We discussed Mr. Huard remains intubated and sedated likely due to an aspiration event. Robert Vega shares he will be traveling to [place] from [place]  and will likely arrive this evening. Shared with Robert Vega at this time, Robert Vega remains critically ill but stable.  Robert Vega shares he will contact his brother. He does not believe Robert Vega has completed advanced directives in the past.  Robert Vega shares she has been concerned for Mr. Seman for a while. She is aware he has had difficulty swallowing in the past-has a modified diet of recommendations he has been following. She shares his "chocking" episodes seem to be happening more frequently and that he has been vomiting almost after every meal.  All questions/concerns addressed. Emotional support provided to patient/family/support persons. PMT will continue to follow and support patient as needed.  Objective: Primary Diagnoses: Present on Admission:  Multifocal pneumonia  Essential hypertension, benign  GERD (gastroesophageal reflux disease)   Physical Exam Constitutional:      Appearance: He is ill-appearing.  Cardiovascular:     Rate and Rhythm: Normal rate.  Pulmonary:     Effort: Pulmonary effort is normal. No respiratory distress.  Skin:    General: Skin is warm and dry.     Vital Signs: BP (!) 102/57 (BP Location: Left Arm)   Pulse (!) 47   Temp 98.1 F (36.7 C) (Axillary)   Resp 18   Ht 5\' 9"  (1.753 m)   Wt 107.7 kg   SpO2 97%   BMI 35.06 kg/m  Pain Scale: CPOT   Pain Score: 0-No pain  IO: Intake/output summary:  Intake/Output Summary (Last 24 hours) at 10/06/2023 1742 Last data filed at 10/06/2023  1706 Gross per 24 hour  Intake 901.45 ml  Output 920 ml  Net -18.55 ml    LBM: Last BM Date : 10/04/23 Baseline Weight: Weight: (!) 138.6 kg Most recent weight: Weight: 107.7 kg      Assessment and  Plan  SUMMARY OF RECOMMENDATIONS   GOC with Robert Vega once he arrives from [place]  PMT to continue to follow for ongoing needs and support  Palliative Prophylaxis:   Bowel Regimen, Delirium Protocol and Frequent Pain Assessment  Discussed With: CCM team and nursing staff   Thank you for this consult and allowing Palliative Medicine to participate in the care of Lyanne Sample. Palliative medicine will continue to follow and assist as needed.   Time Total: 75 minutes  Time spent includes: Detailed review of medical records (labs, imaging, vital signs), medically appropriate exam (mental status, respiratory, cardiac, skin), discussed with treatment team, counseling and educating patient, family and staff, documenting clinical information, medication management and coordination of care.   Signed by: Isadore Marble, DNP, AGNP-C Palliative Medicine    Please contact Palliative Medicine Team phone at [phone] for questions and concerns.  For individual provider: See Tilford Foley

## 2023-10-06 NOTE — Progress Notes (Signed)
 Per Pts son Harlon Light to let himself, as well as Brian Campanile and Mr/ Mrs. Julaine Nutting up to visit pt at this time.

## 2023-10-06 NOTE — Progress Notes (Signed)
 SLP Cancellation Note  Patient Details Name: Robert Vega MRN: [id number] DOB: Nov 21, 1940   Cancelled treatment:       Reason Eval/Treat Not Completed: Medical issues which prohibited therapy;Patient not medically ready (chart reviewed)  Per chart review, pt is now in CCU 16 s/p vomiting event w/ suspected aspiration event of vomitus > respiratory decline and intubation. Per Imaging this admit: Large Hiatal Hernia with a markedly dilated fluid-filled Esophagus. Pt has a known Esophageal phase Dysphagia: "EGD 03/2022 with food in upper esophagus complicated by aspiration event, cardiac arrest with round of CPR, and post resuscitation EGD with concern for lack of peristalsis. Prior EGD 08/2020 with note of abnormal cricopharyngeus, decrease in motility in esophagus, and spastic LES.".   Pt was recommended to have GI f/u. Per bedside swallow eval and MBSS 06/05/2023, pt exhibits a functional oropharyngeal phase swallow for age. ST services will sign off at this time.     Darla Edward, MS, CCC-SLP Speech Language Pathologist Rehab Services; [facility] [phone] (ascom) Winton Offord 10/06/2023, 8:26 AM

## 2023-10-06 NOTE — Progress Notes (Addendum)
 NAME:  Robert Vega, MRN:  [id number], DOB:  1941/02/21, LOS: 1 ADMISSION DATE:  10/05/2023  History of Present Illness:  83 y.o male with significant PMH of  OSA, GERD, Obesity, HTN, Dysphagia: EGD 03/2022 with food in upper esophagus complicated by aspiration event, cardiac arrest with round of CPR, and post resuscitation EGD with concern for lack of peristalsis. Prior EGD 08/2020 with note of abnormal cricopharyngeus, decrease in motility in esophagus, and spastic LES who presented to the ED from with hypoxia, fever and generalized weakness.   Per ED report, patient's significant other was driving him to urgent care when she had to pull over to call EMS due to worsening symptoms. On EMS arrival, patient was found in the urgent care parking lot lethargic and hypoxic with initial sats in the 80's on RA and fever of 100.7. He was placed on 6L en route to the ED with improvement.   ED Course: Initial vital signs showed HR of beats/minute, BP 110/22mm Hg, the RR 16-mid 20's breaths/minute, and the oxygen saturation 84% on and a temperature of 102.39F (38.9C).  Pertinent Labs/Diagnostics Findings: Na+/ K+: 137/3.5 Glucose:152 BUN/Cr.:21/1.00  WBC: 7.7 K/L without bands or neutrophil predominance  COVID PCR: Negative,  Lactic: 2.2 VBG: pO2 33; pCO2 50; pH 7.38;  HCO3 29.6, %O2 Sat 52.3.  CXR> CTA Chest> see results below Medication administered in the WU:[id number] given 30 cc/kg of fluids and started on broad-spectrum antibiotics Ceftriaxone, Azithromycin and Flagyl for sepsis  secondary to suspected Aspiration Pneumonia.  Disposition:Admitted to medsurg under [facility] service  Pertinent  Medical History  OSA, GERD, Obesity, HTN, Dysphagia: EGD 03/2022 with food in upper esophagus complicated by aspiration event, cardiac arrest with round of CPR, and post resuscitation EGD with concern for lack of peristalsis. Prior EGD 08/2020 with note of abnormal cricopharyngeus, decrease in motility in esophagus,  and spastic LES   Significant Hospital Events: Including procedures, antibiotic start and stop dates in addition to other pertinent events   5/10: Admit to [facility] service with sepsis due to Aspiration Pneumonia.  Course complicated by Acute Respiratory Failure due to Aspiration of vomitus requiring  transfer to ICU and intubation.   Interim History / Subjective:  Intubated and sedated.   Objective    Blood pressure (!) 105/58, pulse (!) 58, temperature 98.9 F (37.2 C), temperature source Axillary, resp. rate 18, height 5\' 9"  (1.753 m), weight 107.7 kg, SpO2 98%.    Vent Mode: PRVC FiO2 (%):  [50 %-100 %] 50 % Set Rate:  [18 bmp] 18 bmp Vt Set:  [500 mL] 500 mL PEEP:  [5 cmH20-8 cmH20] 8 cmH20 Plateau Pressure:  [21 cmH20] 21 cmH20   Intake/Output Summary (Last 24 hours) at 10/06/2023 0952 Last data filed at 10/06/2023 0741 Gross per 24 hour  Intake 1423.62 ml  Output 780 ml  Net 643.62 ml   Filed Weights   10/05/23 0457 10/06/23 0000  Weight: (!) 138.6 kg 107.7 kg    Examination: General:  Chronically ill, frail.  HENT: Supple neck, reactive pupils  Lungs: coarse bilateral air entry  Cardiovascular: Normal S1, Normal S2, RRR  Abdomen: Soft, non tender. Non distended, +BS  Extremities: Warm and well perfused   Labs and imaging were reviewed.   Resolved Hospital Problem list    Assessment & Plan:  83 y.o male with significant PMH of  OSA, GERD, Obesity, HTN, Dysphagia: EGD 03/2022 with food in upper esophagus complicated by aspiration event, cardiac arrest with round of CPR,  and post resuscitation EGD with concern for lack of peristalsis. Prior EGD 08/2020 with note of abnormal cricopharyngeus, decrease in motility in esophagus, and spastic LES who presented to the ED from with hypoxia, fever and generalized weakness.  #Acute hypoxic respiratory failure requiring intubation and mechanincal ventilation 05/10 secondary to... #Septic shock secondary to.... #Aspiration  pneumonia  #Achalasia (CT chest with large hiatal hernia with a markedly dilated esophagus) #HTN  #GERD  #Penile Phimosis appears non emergent. To consult urology in the am.   Neuro: Propofol  and fentanyl for analgosedation RASS 0 to -1 and CPOT < 2.  CVS:  NE for MAP > 65. Hold home antihypertensives.  Lungs:  On minimal vent setting. LPV (6cc/kg). Titrate to SpO2 > 90% and PH > 7.2. Ceftriaxone for Abx coverage. Bronchoscopy this am/  GI: NG tube to suction. PPI for prophylaxis.  Renal: At baseline stable. Monitor UOP.  Heme: DVT px per protocol.  Endo: POC 140-180.   SBT/SAT in the AM.   Best Practice (right click and "Reselect all SmartList Selections" daily)   Diet/type: NPO DVT prophylaxis LMWH Pressure ulcer(s): N/A GI prophylaxis: PPI Lines: N/A Foley:  N/A Code Status:  full code Last date of multidisciplinary goals of care discussion [10/06/2023]   Critical care time: 60 minutes     Annitta Kindler, MD [facility] 10/06/2023 2:23 PM

## 2023-10-06 NOTE — Procedures (Signed)
 Bronchoscopy Procedure Note  Robert Vega  [id number]  Dec 10, 1940  Date:10/06/23  Time:11:32 AM   Provider Performing:Jean-Pierre Sebastian Lurz   Procedure(s):  Flexible Bronchoscopy [id number])  Indication(s) Airway clearance  Consent Risks of the procedure as well as the alternatives and risks of each were explained to the patient and/or caregiver.  Consent for the procedure was obtained and is signed in the bedside chart  Anesthesia GA  Time Out Verified patient identification, verified procedure, site/side was marked, verified correct patient position, special equipment/implants available, medications/allergies/relevant history reviewed, required imaging and test results available.  Sterile Technique Usual hand hygiene, masks, gowns, and gloves were used  Procedure Description Bronchoscope advanced through endotracheal tube and into airway.  Airways were examined down to subsegmental level with findings noted below.   Following diagnostic evaluation, BAL(s) performed in RLL with normal saline and return of 15cc fluid  Findings: Carina was sharp without any lesions noted. Thick secretions noted in the RM, BI and RLL that were suctioned and did not recur. Left main was normal, LLL was cleared.   Complications/Tolerance None; patient tolerated the procedure well. Chest X-ray is not needed post procedure.  EBL Minimal  Specimen(s) RLL wash was  done and 15 cc were sent for culture.   Robert Kindler, MD [facility] 10/06/2023 11:37 AM

## 2023-10-06 NOTE — Consult Note (Signed)
 Consultation  Referring Provider:  Hospitalist    Admit date: 10/05/2023 Consult date      10/06/2023   Reason for Consultation: Abnormal imaging             HPI:   Robert Vega is a 83 y.o. gentleman with history of hypertension, OSA, obesity, and long history of dysphagia who presented with concerns for aspiration pneumonia and imaging findings of dilated esophagus. Patient required intubation so no history able to be obtained from patient. It appears he had EGD in 2023 with Dr. Antony Baumgartner for food in the esophagus that led to a cardiopulmonary arrest. 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 Unclear why he didn't meet criteria for achalasia type III but regardless he functionally has achalasia/dysmotility disorder.  Past Medical History:  Diagnosis Date   Allergy    Arthritis    BPH (benign prostatic hypertrophy)    Colonic polyp    GERD (gastroesophageal reflux disease)    Has LPR   Hypertension     Past Surgical History:  Procedure Laterality Date   CATARACT EXTRACTION     2011, other in 2013   COLONOSCOPY  2011   ESOPHAGOGASTRODUODENOSCOPY (EGD) WITH PROPOFOL  N/A 08/30/2020   Procedure: ESOPHAGOGASTRODUODENOSCOPY (EGD) WITH PROPOFOL ;  Surgeon: Luke Salaam, MD;  Location: [facility] ENDOSCOPY;  Service: Gastroenterology;  Laterality: N/A;   ESOPHAGOGASTRODUODENOSCOPY (EGD) WITH PROPOFOL  N/A 04/02/2022   Procedure: ESOPHAGOGASTRODUODENOSCOPY (EGD) WITH PROPOFOL ;  Surgeon: Luke Salaam, MD;  Location: [facility] ENDOSCOPY;  Service: Gastroenterology;  Laterality: N/A;   skin cancer removal      Family History  Problem Relation Age of Onset   Hypertension Mother    Heart disease Neg Hx    Diabetes Neg Hx    Cancer Neg Hx     Social History   Tobacco Use   Smoking status: Former    Current packs/day: 0.00    Average packs/day: 2.0 packs/day  for 50.0 years (100.0 ttl pk-yrs)    Types: Pipe, Cigarettes    Start date: 10/19/1964    Quit date: 10/20/2014    Years since quitting: 8.9    Passive exposure: Past   Smokeless tobacco: Never   Tobacco comments:    pipe   Vaping Use   Vaping status: Never Used  Substance Use Topics   Alcohol use: No    Alcohol/week: 0.0 standard drinks of alcohol   Drug use: No    Prior to Admission medications   Medication Sig Start Date End Date Taking? Authorizing Provider  fluticasone (FLONASE) 50 MCG/ACT nasal spray Place 2 sprays into both nostrils daily. 09/11/23  Yes [provider]  acetaminophen  (TYLENOL ) 650 MG CR tablet Take 650 mg by mouth every morning.    [provider]  aluminum hydroxide-magnesium carbonate (GAVISCON) 95-358 MG/15ML SUSP Take by mouth.    [provider]  amLODipine  (NORVASC ) 2.5 MG tablet Take 1 tablet by mouth daily. 09/05/22   [provider]  aspirin  81 MG tablet Take 81 mg by mouth daily.    [provider]  diltiazem  (CARDIZEM  CD) 120 MG 24 hr capsule Take 120 mg by mouth daily. 07/29/23 07/28/24  [provider]  docusate sodium (COLACE) 50 MG capsule Take 50 mg by mouth daily as needed for moderate constipation or mild constipation.    [provider]  GLUCOSAMINE-CHONDROITIN-MSM PO  Take by mouth.    [provider]  lansoprazole  (PREVACID ) 30 MG capsule TAKE ONE CAPSULE BY MOUTH EVERY NIGHT AT BEDTIME 02/25/23   Helaine Llanos, MD  Multiple Vitamins-Minerals (MULTIVITAMIN WITH MINERALS) tablet Take 1 tablet by mouth daily.    [provider]  tadalafil  (CIALIS ) 20 MG tablet TAKE 1/2 TO 1 TABLET BY MOUTH EVERY OTHER DAY AS NEEDED FOR ERECTILE DYSFUNCTION 09/20/22   Helaine Llanos, MD    Current Facility-Administered Medications  Medication Dose Route Frequency Provider Last Rate Last Admin   acetaminophen  (TYLENOL ) tablet 650 mg  650 mg Oral Q6H PRN Newton, Steven J, MD        azithromycin (ZITHROMAX) 500 mg in sodium chloride  0.9 % 250 mL IVPB  500 mg Intravenous Q24H Corrinne Din, MD   Stopped at 10/06/23 0650   Chlorhexidine  Gluconate Cloth 2 % PADS 6 each  6 each Topical Daily Gideon Kussmaul, NP       docusate (COLACE) 50 MG/5ML liquid 100 mg  100 mg Per Tube BID Ouma, Elizabeth Achieng, NP   100 mg at 10/05/23 2335   enoxaparin  (LOVENOX ) injection 70 mg  0.5 mg/kg Subcutaneous Q24H Corrinne Din, MD   70 mg at 10/05/23 2334   famotidine (PEPCID) IVPB 20 mg premix  20 mg Intravenous Q24H Paliwal, Zenda Highman, MD   Stopped at 10/06/23 0013   fentaNYL (SUBLIMAZE) bolus via infusion 25-100 mcg  25-100 mcg Intravenous Q15 min PRN Gideon Kussmaul, NP   100 mcg at 10/05/23 2354   fentaNYL (SUBLIMAZE) injection 25 mcg  25 mcg Intravenous Once Ouma, Elizabeth Achieng, NP       fentaNYL 2500mcg in NS (60mcg/ml) infusion-PREMIX  25-200 mcg/hr Intravenous Continuous Gideon Kussmaul, NP 5 mL/hr at 10/06/23 0741 50 mcg/hr at 10/06/23 0741   insulin aspart (novoLOG) injection 0-9 Units  0-9 Units Subcutaneous Q4H Gideon Kussmaul, NP       ipratropium-albuterol  (DUONEB) 0.5-2.5 (3) MG/3ML nebulizer solution 3 mL  3 mL Nebulization Q4H PRN Corrinne Din, MD   3 mL at 10/05/23 1818   [START ON 10/07/2023] methylPREDNISolone sodium succinate (SOLU-MEDROL) 40 mg/mL injection 40 mg  40 mg Intravenous Q24H Meegan, Eryn, RPH       norepinephrine (LEVOPHED) 4mg  in (0.016 mg/mL) premix infusion  0-10 mcg/min Intravenous Titrated Ouma, Elizabeth Achieng, NP   Stopped at 10/06/23 0739   ondansetron  (ZOFRAN ) tablet 4 mg  4 mg Oral Q6H PRN Corrinne Din, MD       Or   ondansetron  (ZOFRAN ) injection 4 mg  4 mg Intravenous Q6H PRN Newton, Steven J, MD   4 mg at 10/05/23 1713   pantoprazole  (PROTONIX ) injection 40 mg  40 mg Intravenous Q12H Gideon Kussmaul, NP       piperacillin-tazobactam (ZOSYN) IVPB 3.375 g  3.375 g Intravenous Q8H  Belue, Nathan S, RPH       polyethylene glycol (MIRALAX / GLYCOLAX) packet 17 g  17 g Per Tube Daily Ouma, Phoebe Breed, NP       promethazine (PHENERGAN) 12.5 mg in sodium chloride  0.9 % 50 mL IVPB  12.5 mg Intravenous Q6H PRN Corrinne Din, MD       propofol  (DIPRIVAN ) 1000 MG/100ML infusion  0-50 mcg/kg/min Intravenous Continuous Gideon Kussmaul, NP 20.8 mL/hr at 10/06/23 0832 25 mcg/kg/min at 10/06/23 4098    Allergies as of 10/05/2023 - Review Complete 10/05/2023  Allergen Reaction Noted   Sulfonamide  derivatives  04/17/2007     Review of Systems:    All systems reviewed and negative except where noted in HPI.  Unable to obtain due to patient intubated/sedated    Physical Exam:  Vital signs in last 24 hours: Temp:  [98.5 F (36.9 C)-99.2 F (37.3 C)] 98.9 F (37.2 C) (05/11 0800) Pulse Rate:  [58-98] 58 (05/11 0900) Resp:  [8-40] 18 (05/11 0900) BP: (70-145)/(33-67) 105/58 (05/11 0900) SpO2:  [86 %-100 %] 98 % (05/11 0900) FiO2 (%):  [50 %-100 %] 50 % (05/11 0736) Weight:  [107.7 kg] 107.7 kg (05/11 0000) Last BM Date : 10/04/23 General:   Intubated Head:  Normocephalic and atraumatic. Eyes:   No icterus.   Conjunctiva pink. Mouth: Mucosa pink moist, no lesions. Neck:  Supple; no masses felt Lungs:  ventilated Heart:  RRR Abdomen:   Soft, non-distended Msk:  No clubbing or cyanosis Neurologic:  Sedated Skin:  Warm, dry, pink without significant lesions or rashes.  LAB RESULTS: Recent Labs    10/05/23 0506 10/06/23 0042 10/06/23 0253  WBC 7.7 12.6* 14.0*  HGB 12.0* 10.7* 10.7*  HCT 35.5* 32.6* 32.0*  PLT 216 202 201   BMET Recent Labs    10/05/23 0506 10/06/23 0042 10/06/23 0253  NA 137 137 136  K 3.5 3.9 3.5  CL 99 101 101  CO2 26 26 25   GLUCOSE 152* 126* 119*  BUN 21 18 18   CREATININE 1.00 0.95 0.84  CALCIUM 8.5* 7.8* 7.7*   LFT Recent Labs    10/06/23 0253  PROT 6.7  ALBUMIN 3.1*  AST 30  ALT 16  ALKPHOS 53  BILITOT  0.8   PT/INR Recent Labs    10/05/23 0506  LABPROT 14.7  INR 1.1    STUDIES: DG Chest Port 1 View Result Date: 10/05/2023 CLINICAL DATA:  Intubation. EXAM: PORTABLE CHEST 1 VIEW COMPARISON:  Radiographs and CT earlier today FINDINGS: The endotracheal tube tip is 4.8 cm from the carina. Enteric tube tip is in the distal esophagus. Worsening right lung base opacity. Diffuse opacities were otherwise better characterized on CT, and grossly stable. Stable heart size and mediastinal contours allowing for rotation. No pneumothorax or large pleural effusion. IMPRESSION: 1. Endotracheal tube tip 4.8 cm from the carina. 2. Enteric tube tip in the distal esophagus. Recommend advancement, 9 cm to place the side-port below the diaphragm. 3. Worsening right lung base opacity. Diffuse opacities were otherwise better characterized on CT, and grossly stable. Electronically Signed   By: Chadwick Colonel M.D.   On: 10/05/2023 23:40   CT CHEST WO CONTRAST Result Date: 10/05/2023 CLINICAL DATA:  Respiratory illness. EXAM: CT CHEST WITHOUT CONTRAST TECHNIQUE: Multidetector CT imaging of the chest was performed following the standard protocol without IV contrast. RADIATION DOSE REDUCTION: This exam was performed according to the departmental dose-optimization program which includes automated exposure control, adjustment of the mA and/or kV according to patient size and/or use of iterative reconstruction technique. COMPARISON:  August 08, 2023 FINDINGS: Cardiovascular: No significant vascular findings. Normal heart size with marked severity coronary artery calcification. No pericardial effusion. Mediastinum/Nodes: No enlarged mediastinal or axillary lymph nodes. The thyroid  gland and trachea demonstrate no significant findings. A markedly dilated fluid-filled esophagus is seen. Lungs/Pleura: Marked severity bilateral multifocal infiltrates are seen. This is most prominent within the right middle lobe and bilateral lower  lobes. There are small bilateral pleural effusions. No pneumothorax is identified. Upper Abdomen: There is a large hiatal hernia. Musculoskeletal: Multilevel degenerative changes seen  throughout the thoracic spine. IMPRESSION: 1. Marked severity bilateral multifocal infiltrates, most prominent within the right middle lobe and bilateral lower lobes. 2. Small bilateral pleural effusions. 3. Large hiatal hernia with a markedly dilated fluid-filled esophagus. 4. Marked severity coronary artery calcification. Electronically Signed   By: Virgle Grime M.D.   On: 10/05/2023 22:06   DG Chest Port 1 View Result Date: 10/05/2023 CLINICAL DATA:  Questionable sepsis. EXAM: PORTABLE CHEST 1 VIEW COMPARISON:  08/08/2023 FINDINGS: The heart size and mediastinal contours are unremarkable. There is bilateral lower lobe airspace opacities, right greater than left. Pulmonary vascular congestion. No pleural effusions. Visualized osseous structures are unremarkable. IMPRESSION: 1. Bilateral lower lobe airspace opacities, right greater than left. Findings are concerning for pneumonia. 2. Pulmonary vascular congestion. Electronically Signed   By: Kimberley Penman M.D.   On: 10/05/2023 07:29       Impression / Plan:   83 y/o gentleman with esophageal motility disorder who presented with aspiration requiring intubation. No further work-up for dilated esophagus needed. In the note last month, patient did not want any further EGD's. He will need to follow back up with the specialist at [facility] and discuss potentially a POEM to help decrease the risk of a recurrent event.  - please re-consult if any further questions or issues  Olivia Bevel MD, MPH [facility]

## 2023-10-07 ENCOUNTER — Inpatient Hospital Stay

## 2023-10-07 DIAGNOSIS — J690 Pneumonitis due to inhalation of food and vomit: Secondary | ICD-10-CM | POA: Diagnosis not present

## 2023-10-07 DIAGNOSIS — I10 Essential (primary) hypertension: Secondary | ICD-10-CM | POA: Diagnosis not present

## 2023-10-07 DIAGNOSIS — J9602 Acute respiratory failure with hypercapnia: Secondary | ICD-10-CM | POA: Diagnosis not present

## 2023-10-07 DIAGNOSIS — R918 Other nonspecific abnormal finding of lung field: Secondary | ICD-10-CM | POA: Diagnosis not present

## 2023-10-07 DIAGNOSIS — K220 Achalasia of cardia: Secondary | ICD-10-CM | POA: Diagnosis not present

## 2023-10-07 DIAGNOSIS — G9341 Metabolic encephalopathy: Secondary | ICD-10-CM

## 2023-10-07 DIAGNOSIS — J9601 Acute respiratory failure with hypoxia: Secondary | ICD-10-CM | POA: Diagnosis not present

## 2023-10-07 DIAGNOSIS — Z4682 Encounter for fitting and adjustment of non-vascular catheter: Secondary | ICD-10-CM | POA: Diagnosis not present

## 2023-10-07 DIAGNOSIS — J189 Pneumonia, unspecified organism: Secondary | ICD-10-CM | POA: Diagnosis not present

## 2023-10-07 DIAGNOSIS — T17908A Unspecified foreign body in respiratory tract, part unspecified causing other injury, initial encounter: Secondary | ICD-10-CM | POA: Diagnosis not present

## 2023-10-07 DIAGNOSIS — I517 Cardiomegaly: Secondary | ICD-10-CM | POA: Diagnosis not present

## 2023-10-07 DIAGNOSIS — R140 Abdominal distension (gaseous): Secondary | ICD-10-CM | POA: Diagnosis not present

## 2023-10-07 DIAGNOSIS — Z87898 Personal history of other specified conditions: Secondary | ICD-10-CM | POA: Diagnosis not present

## 2023-10-07 DIAGNOSIS — K9423 Gastrostomy malfunction: Secondary | ICD-10-CM | POA: Diagnosis not present

## 2023-10-07 DIAGNOSIS — A419 Sepsis, unspecified organism: Secondary | ICD-10-CM | POA: Diagnosis not present

## 2023-10-07 DIAGNOSIS — K219 Gastro-esophageal reflux disease without esophagitis: Secondary | ICD-10-CM | POA: Diagnosis not present

## 2023-10-07 DIAGNOSIS — R6521 Severe sepsis with septic shock: Secondary | ICD-10-CM | POA: Diagnosis not present

## 2023-10-07 LAB — BASIC METABOLIC PANEL WITH GFR
Anion gap: 9 (ref 5–15)
BUN: 21 mg/dL (ref 8–23)
CO2: 25 mmol/L (ref 22–32)
Calcium: 7.9 mg/dL — ABNORMAL LOW (ref 8.9–10.3)
Chloride: 106 mmol/L (ref 98–111)
Creatinine, Ser: 0.84 mg/dL (ref 0.61–1.24)
GFR, Estimated: >60 mL/min (ref >60)
Glucose, Bld: 137 mg/dL — ABNORMAL HIGH (ref 70–99)
Potassium: 3.6 mmol/L (ref 3.5–5.1)
Sodium: 140 mmol/L (ref 135–145)

## 2023-10-07 LAB — BLOOD GAS, VENOUS
Bicarbonate: 27.4 mmol/L (ref 20.0–28.0)
O2 Saturation: 50.6 mmol/L (ref 0.0–2.0)
Patient temperature: 37
Patient temperature: 50.6 %
pCO2, Ven: 57 mmHg (ref 44–60)
pH, Ven: 7.29 (ref 7.25–7.43)
pO2, Ven: 27.4 mmol/L (ref 32–45)

## 2023-10-07 LAB — CBC
HCT: 30.8 % — ABNORMAL LOW (ref 39.0–52.0)
Hemoglobin: 10.4 g/dL — ABNORMAL LOW (ref 13.0–17.0)
MCH: 32.6 pg (ref 26.0–34.0)
MCHC: 33.8 g/dL (ref 30.0–36.0)
MCV: 96.6 fL (ref 80.0–100.0)
Platelets: 243 10*3/uL (ref 150–400)
RBC: 3.19 MIL/uL — ABNORMAL LOW (ref 4.22–5.81)
RDW: 13.2 % (ref 11.5–15.5)
WBC: 10.8 10*3/uL — ABNORMAL HIGH (ref 4.0–10.5)
nRBC: 0 % (ref 0.0–0.2)

## 2023-10-07 LAB — LEGIONELLA PNEUMOPHILA SEROGP 1 UR AG: L. pneumophila Serogp 1 Ur Ag: NEGATIVE

## 2023-10-07 LAB — GLUCOSE, CAPILLARY
Glucose-Capillary: 105 mg/dL — ABNORMAL HIGH (ref 70–99)
Glucose-Capillary: 115 mg/dL — ABNORMAL HIGH (ref 70–99)
Glucose-Capillary: 123 mg/dL — ABNORMAL HIGH (ref 70–99)
Glucose-Capillary: 128 mg/dL — ABNORMAL HIGH (ref 70–99)
Glucose-Capillary: 129 mg/dL — ABNORMAL HIGH (ref 70–99)
Glucose-Capillary: 131 mg/dL — ABNORMAL HIGH (ref 70–99)
Glucose-Capillary: 136 mg/dL — ABNORMAL HIGH (ref 70–99)

## 2023-10-07 LAB — PHOSPHORUS: Phosphorus: 2.4 mg/dL — ABNORMAL LOW (ref 2.5–4.6)

## 2023-10-07 LAB — CULTURE, RESPIRATORY W GRAM STAIN

## 2023-10-07 LAB — MAGNESIUM: Magnesium: 2.2 mg/dL (ref 1.7–2.4)

## 2023-10-07 MED ORDER — ORAL CARE MOUTH RINSE: 15.0000 mL | OROMUCOSAL | Status: DC | PRN

## 2023-10-07 MED ORDER — HYDRALAZINE HCL 20 MG/ML IJ SOLN
10.0000 mg | Freq: Four times a day (QID) | INTRAMUSCULAR | Status: DC | PRN
  Administered 2023-10-07 – 2023-10-11 (×6): 10 mg via INTRAVENOUS
  Filled 2023-10-07 (×6): qty 1

## 2023-10-07 MED ORDER — MIDAZOLAM HCL 2 MG/2ML IJ SOLN
INTRAMUSCULAR | Status: AC
  Filled 2023-10-07: qty 2

## 2023-10-07 MED ORDER — MIDAZOLAM HCL 2 MG/2ML IJ SOLN
2.0000 mg | INTRAMUSCULAR | Status: DC | PRN
  Administered 2023-10-07 – 2023-10-08 (×4): 2 mg via INTRAVENOUS
  Filled 2023-10-07 (×3): qty 2

## 2023-10-07 MED ORDER — MIDAZOLAM HCL 2 MG/2ML IJ SOLN: 2.0000 mg | Freq: Once | INTRAMUSCULAR | Status: AC

## 2023-10-07 MED ORDER — ORAL CARE MOUTH RINSE
15.0000 mL | OROMUCOSAL | Status: DC
  Administered 2023-10-07 – 2023-10-10 (×34): 15 mL via OROMUCOSAL

## 2023-10-07 MED ORDER — ATROPINE SULFATE 1 MG/10ML IJ SOSY
1.0000 mg | PREFILLED_SYRINGE | INTRAMUSCULAR | Status: AC | PRN
  Administered 2023-10-07 – 2023-10-11 (×3): 1 mg via INTRAVENOUS
  Filled 2023-10-07 (×3): qty 10

## 2023-10-07 NOTE — Progress Notes (Signed)
 I spoke to the patient's healthcare power of attorney about a PEG tube including the risk benefits and alternatives.  She was informed that infection bleeding hitting other organs and death are risk factors for the procedure.  She has also been told that this does not eliminate the risk of aspiration.  She has been given ample time to ask questions and agrees to having the PEG tube placed.  The patient will have the PEG tube placed tomorrow.

## 2023-10-07 NOTE — Progress Notes (Signed)
 Palliative Care Progress Note, Assessment & Plan   Patient Name: Robert Vega       Date: 10/07/2023 DOB: [birth year]  Age: 83 y.o. MRN#: [id number] Attending Physician: Cleve Dale, MD Primary Care Physician: Helaine Llanos, MD Admit Date: 10/05/2023  Subjective: Patient is lying in bed, in no apparent distress, on mechanical ventilatory support.  He is not awake or able to engage in conversation.  His son [name] is at bedside during my visit.  HPI: 83 y.o. male  with past medical history of dysphagia (s/p EGD 03/2022 with food impaction in upper esophagus-suffered aspiration and cardiac arrest--concern for lack or peristalsis, OSA and HTN admitted from home on 10/05/2023 with hypoxia, fever and generalized weakness.   Admitted initially to [facility] service, unfortunately overnight suffered likely aspiration event of vomitus--transferred to ICU and required mechanical ventilation   Bronch 5/11   Palliative medicine was consulted for assisting with goals of care conversations.  Summary of counseling/coordination of care: Extensive chart review completed prior to meeting patient including labs, vital signs, imaging, progress notes, orders, and available advanced directive documents from current and previous encounters.   After reviewing the patient's chart and assessing the patient at bedside, I spoke with patient in regards to symptom management and goals of care.   I attempted to gauge [name]'s understanding of patient's current medical situation.  He shares he is hopeful to hear from GI to be able to "fix" the patient's esophageal problem.  Education provided on "fixing" versus avoiding future aspirations and minimizing risks.  I attempted to elicit values and goals important to the patient as [name] is  aware of them.  [name] shares the patient has not made his wishes known to him but has had several discussions with the patient's granddaughter [name].  [name] shares that [name] is [employer] and is familiar with the patient's wishes.  After visiting with patient and [name], I spoke with [name] over the phone.  Brief medical update given to [name].  Space and opportunity provided for her to ask questions and voiced concerns regarding patient's current medical situation.  [name] shares that patient has made his wishes known to her and several conversations over the years.  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  She shares patient is in agreement with full code.  Discussed concern for nutrition and continued risk for aspiration if patient is successfully extubated.  She shares she is in agreement for placement of PEG tube.  Risk and benefits of PEG tube briefly discussed.  She shares she would like to have the PEG tube placed and then hopefully have further discussions "down the road" in regards to what patient would like to do.  Full code and full scope remain.  Patient's HCPOA granddaughter [name] in agreement with PEG tube placement.  Medical team made aware of her wishes.  PMT will continue to follow and support.  Physical Exam Vitals reviewed.  Constitutional:      General: He is not in acute distress.    Appearance: He is not ill-appearing.  HENT:     Head: Normocephalic.  Nose: Nose normal.  Cardiovascular:     Pulses: Normal pulses.  Pulmonary:     Comments: Ventilatory support Abdominal:     Palpations: Abdomen is soft.  Skin:    General: Skin is warm and dry.             Total Time 50 minutes   Time spent includes: Detailed review of medical records (labs, imaging, vital signs), medically appropriate exam (mental status, respiratory, cardiac, skin),  discussed with treatment team, counseling and educating patient, family and staff, documenting clinical information, medication management and coordination of care.  Judeen Nose L. Rebbeca Campi, DNP, FNP-BC Palliative Medicine Team

## 2023-10-07 NOTE — Progress Notes (Signed)
 NAME:  DINESH GASSETT, MRN:  [id number], DOB:  May 14, 1941, LOS: 2 ADMISSION DATE:  10/05/2023  History of Present Illness:  83 y.o male with significant PMH of  OSA, GERD, Obesity, HTN, Dysphagia: EGD 03/2022 with food in upper esophagus complicated by aspiration event, cardiac arrest with round of CPR, and post resuscitation EGD with concern for lack of peristalsis. Prior EGD 08/2020 with note of abnormal cricopharyngeus, decrease in motility in esophagus, and spastic LES who presented to the ED from with hypoxia, fever and generalized weakness.   Per ED report, patient's significant other was driving him to urgent care when she had to pull over to call EMS due to worsening symptoms. On EMS arrival, patient was found in the urgent care parking lot lethargic and hypoxic with initial sats in the 80's on RA and fever of 100.7. He was placed on 6L en route to the ED with improvement.   ED Course: Initial vital signs showed HR of beats/minute, BP 110/66mm Hg, the RR 16-mid 20's breaths/minute, and the oxygen saturation 84% on and a temperature of 102.58F (38.9C).  Pertinent Labs/Diagnostics Findings: Na+/ K+: 137/3.5 Glucose:152 BUN/Cr.:21/1.00  WBC: 7.7 K/L without bands or neutrophil predominance  COVID PCR: Negative,  Lactic: 2.2 VBG: pO2 33; pCO2 50; pH 7.38;  HCO3 29.6, %O2 Sat 52.3.  CXR> CTA Chest> see results below Medication administered in the WU:[id number] given 30 cc/kg of fluids and started on broad-spectrum antibiotics Ceftriaxone, Azithromycin and Flagyl for sepsis  secondary to suspected Aspiration Pneumonia.  Disposition:Admitted to medsurg under [facility] service  Pertinent  Medical History  OSA, GERD, Obesity, HTN, Dysphagia: EGD 03/2022 with food in upper esophagus complicated by aspiration event, cardiac arrest with round of CPR, and post resuscitation EGD with concern for lack of peristalsis. Prior EGD 08/2020 with note of abnormal cricopharyngeus, decrease in motility in esophagus,  and spastic LES   Significant Hospital Events: Including procedures, antibiotic start and stop dates in addition to other pertinent events   5/10: Admit to [facility] service with sepsis due to Aspiration Pneumonia.  Course complicated by Acute Respiratory Failure due to Aspiration of vomitus requiring s/p cardiac arrest  transfer to ICU and intubation.  5/12 remains intubated  Interim History / Subjective:  Remains critically ill Remains intubated Requires VENT support for survival Vent Mode: PRVC FiO2 (%):  [40 %-50 %] 40 % Set Rate:  [18 bmp] 18 bmp Vt Set:  [500 mL] 500 mL PEEP:  [8 cmH20] 8 cmH20 Plateau Pressure:  [21 cmH20-22 cmH20] 21 cmH20   Objective    Blood pressure 130/62, pulse (!) 47, temperature 98 F (36.7 C), temperature source Oral, resp. rate 18, height 5' 9.02" (1.753 m), weight 107.7 kg, SpO2 96%.    Vent Mode: PRVC FiO2 (%):  [40 %-50 %] 40 % Set Rate:  [18 bmp] 18 bmp Vt Set:  [500 mL] 500 mL PEEP:  [8 cmH20] 8 cmH20 Plateau Pressure:  [21 cmH20-22 cmH20] 21 cmH20   Intake/Output Summary (Last 24 hours) at 10/07/2023 0823 Last data filed at 10/07/2023 0745 Gross per 24 hour  Intake 676.74 ml  Output 850 ml  Net -173.26 ml   Filed Weights   10/05/23 0457 10/06/23 0000  Weight: (!) 138.6 kg 107.7 kg    Examination: General:  Chronically ill, frail.  HENT: Supple neck, reactive pupils  Lungs: coarse bilateral air entry  Cardiovascular: Normal S1, Normal S2, RRR  Abdomen: Soft, non tender. Non distended, +BS  Extremities: Warm and well  perfused   Labs and imaging were reviewed.   Resolved Hospital Problem list    Assessment & Plan:  83 y.o male with significant PMH of  OSA, GERD, Obesity, HTN, Dysphagia: EGD 03/2022 with food in upper esophagus complicated by aspiration event, cardiac arrest with round of CPR, and post resuscitation EGD with concern for lack of peristalsis. Prior EGD 08/2020 with note of abnormal cricopharyngeus, decrease in motility  in esophagus, and spastic LES who presented to the ED from with hypoxia, fever and generalized weakness.   Severe ACUTE Hypoxic and Hypercapnic Respiratory Failure -continue Mechanical Ventilator support -Wean Fio2 and PEEP as tolerated -VAP/VENT bundle implementation - Wean PEEP & FiO2 as tolerated, maintain SpO2 > 88% - Head of bed elevated 30 degrees, VAP protocol in place - Plateau pressures less than 30 cm H20  - Intermittent chest x-ray & ABG PRN - Ensure adequate pulmonary hygiene   INFECTIOUS DISEASE-ASPIRATION PNEUMONIA -continue antibiotics as prescribed -follow up cultures  SEPTIC shock SOURCE-pneumonia -use vasopressors to keep MAP>65 as needed -follow ABG and LA as needed -follow up cultures -emperic ABX   GI Achalasia (CT chest with large hiatal hernia with a markedly dilated esophagus) I recommend PEG tube Follow up GI recs #HTN    NEUROLOGY ACUTE METABOLIC ENCEPHALOPATHY -need for sedation -Goal RASS -2 to -3  RENAL -continue Foley Catheter-assess need -Avoid nephrotoxic agents -Follow urine output, BMP -Ensure adequate renal perfusion, optimize oxygenation -Renal dose medications   Intake/Output Summary (Last 24 hours) at 10/07/2023 0826 Last data filed at 10/07/2023 0745 Gross per 24 hour  Intake 676.74 ml  Output 850 ml  Net -173.26 ml      Latest Ref Rng & Units 10/07/2023    3:11 AM 10/06/2023    2:53 AM 10/06/2023   12:42 AM  BMP  Glucose 70 - 99 mg/dL 161  096  045   BUN 8 - 23 mg/dL 21  18  18    Creatinine 0.61 - 1.24 mg/dL 4.09  8.11  9.14   Sodium 135 - 145 mmol/L 140  136  137   Potassium 3.5 - 5.1 mmol/L 3.6  3.5  3.9   Chloride 98 - 111 mmol/L 106  101  101   CO2 22 - 32 mmol/L 25  25  26    Calcium 8.9 - 10.3 mg/dL 7.9  7.7  7.8       ENDO - ICU hypoglycemic\Hyperglycemia protocol -check FSBS per protocol   GI GI PROPHYLAXIS as indicated NUTRITIONAL STATUS DIET-->NPO Constipation protocol as  indicated   ELECTROLYTES -follow labs as needed -replace as needed -pharmacy consultation and following  RESTRICTIVE TRANSFUSION PROTOCOL TRANSFUSION  IF HGB<7  or ACTIVE BLEEDING OR DX of ACUTE CORONARY SYNDROMES     Best Practice (right click and "Reselect all SmartList Selections" daily)   Diet/type: NPO DVT prophylaxis LMWH Pressure ulcer(s): N/A GI prophylaxis: PPI Lines: N/A Foley:  N/A Code Status:  full code Last date of multidisciplinary goals of care discussion [10/06/2023]     DVT/GI PRX  assessed I Assessed the need for Labs I Assessed the need for Foley I Assessed the need for Central Venous Line Family Discussion when available I Assessed the need for Mobilization I made an Assessment of medications to be adjusted accordingly Safety Risk assessment completed  CASE DISCUSSED IN MULTIDISCIPLINARY ROUNDS WITH ICU TEAM     Critical Care Time devoted to patient care services described in this note is 62 minutes.  Critical care was necessary to  treat /prevent imminent and life-threatening deterioration. Overall, patient is critically ill, prognosis is guarded.  Patient with Multiorgan failure and at high risk for cardiac arrest and death.    Lady Pier, M.D.  [facility]  Medical Director [facility] Medical Director [facility] Cardio-Pulmonary Department

## 2023-10-07 NOTE — Progress Notes (Signed)
 Initial Nutrition Assessment  DOCUMENTATION CODES:   Obesity unspecified  INTERVENTION:   Once G-tube in place and ready for use:  Vital HP @60ml /hr- Initiate at 74ml/hr and increase by 10ml/hr q 8 hours until goal rate is reached.   ProSource TF 20- Give 60ml daily via tube, each supplement provides 80kcal and 20g of protein.   Free water flushes 30ml q4 hours to maintain tube patency   Regimen provides 1520kcal/day, 146g/day protein and 1344ml/day of free water.   Pt at high refeed risk; recommend monitor potassium, magnesium and phosphorus labs daily until stable  Daily weights   NUTRITION DIAGNOSIS:   Inadequate oral intake related to inability to eat (pt sedated and ventilated) as evidenced by NPO status.  GOAL:   Provide needs based on ASPEN/SCCM guidelines  MONITOR:   Vent status, Labs, Weight trends, TF tolerance, I & O's, Skin  REASON FOR ASSESSMENT:   Ventilator    ASSESSMENT:   83 y/o male with h/o GERD, esophageal dysphagia, HTN, BPH, mood disorder and hiatal hernia who is admitted with aspiration event, PNA, sepsis, cardiac arrest and dysphagia.  -Pt s/p EGD 08/30/2020; noted to have abnormal cricopharyngeus, decrease in motility in esophagus and spastic LES s/p dilation.  -Pt s/p EGD 04/02/2022; noted to have food impaction  Pt sedated and ventilated. OGT and dobhoff tube placement attempted by RN but unable to advance past the GE junction. Per chart, it appears pt has been struggling with food getting stuck in his mid-chest for the past 3 years. Son at bedside reports that patient is able to tolerate liquids but solids have been coming back up, especially at night, when pt lies down. Per son, pt had a previous admission where he aspirated and cardiac arrested a couple of years ago. Spoke with son regarding the recommendation for G tube placement if plan is for full aggressive care. RD discussed with son, what to expect as far as tube placement and nutrition  support. Family has decided to pursue PEG tube placement with GI; this is tentatively scheduled for tomorrow. Will plan to initiate tube feeds once G-tube ok to use. Pt is at high refeed risk. Per chart, pt appears weight stable at baseline. Palliative care is following.   Medications reviewed and include: colace, lovenox , insulin, solu-medrol, protonix , miralax, ceftriaxone, pepcid, propofol    Labs reviewed: K 3.6 wnl, P 2.4(L), Mg 2.2 wnl Wbc- 10.8(H), Hgb 10.4(L), Hct 30.8(L) Cbgs- 136, 115, 123, 131 x 24 hrs   Patient is currently intubated on ventilator support MV: 9.0 L/min Temp (24hrs), Avg:97.9 F (36.6 C), Min:97.1 F (36.2 C), Max:98.2 F (36.8 C)  Propofol : 16.63 ml/hr- provides 439kcal/day   MAP >80mmHg   UOP-   NUTRITION - FOCUSED PHYSICAL EXAM:  Flowsheet Row Most Recent Value  Orbital Region No depletion  Upper Arm Region No depletion  Thoracic and Lumbar Region No depletion  Buccal Region No depletion  Temple Region No depletion  Clavicle Bone Region Moderate depletion  Clavicle and Acromion Bone Region Moderate depletion  Scapular Bone Region No depletion  Dorsal Hand No depletion  Patellar Region Moderate depletion  Anterior Thigh Region Moderate depletion  Posterior Calf Region Moderate depletion  Edema (RD Assessment) Mild  Hair Reviewed  Eyes Reviewed  Mouth Reviewed  Skin Reviewed  Nails Reviewed   Diet Order:   Diet Order             Diet NPO time specified  Diet effective now  EDUCATION NEEDS:   No education needs have been identified at this time  Skin:  Skin Assessment: Reviewed RN Assessment (skin tear arm)  Last BM:  5/9  Height:   Ht Readings from Last 1 Encounters:  10/07/23 5' 9.02" (1.753 m)    Weight:   Wt Readings from Last 1 Encounters:  10/06/23 107.7 kg    Ideal Body Weight:  72.7 kg  BMI:  Body mass index is 35.05 kg/m.  Estimated Nutritional Needs:   Kcal:   1185-1508kcal/day  Protein:  >145g/day  Fluid:  1.9-2.2L/day  Torrance Freestone MS, RD, LDN If unable to be reached, please send secure chat to "RD inpatient" available from 8:00a-4:00p daily

## 2023-10-07 NOTE — Plan of Care (Signed)
  Problem: Clinical Measurements: Goal: Ability to maintain clinical measurements within normal limits will improve Outcome: Progressing Goal: Will remain free from infection Outcome: Progressing Goal: Respiratory complications will improve Outcome: Progressing   Problem: Coping: Goal: Level of anxiety will decrease Outcome: Progressing   Problem: Elimination: Goal: Will not experience complications related to urinary retention Outcome: Progressing   Problem: Pain Managment: Goal: General experience of comfort will improve and/or be controlled Outcome: Progressing   Problem: Safety: Goal: Ability to remain free from injury will improve Outcome: Progressing   Problem: Skin Integrity: Goal: Risk for impaired skin integrity will decrease Outcome: Progressing   Problem: Clinical Measurements: Goal: Ability to maintain a body temperature in the normal range will improve Outcome: Progressing   Problem: Fluid Volume: Goal: Ability to maintain a balanced intake and output will improve Outcome: Progressing   Problem: Metabolic: Goal: Ability to maintain appropriate glucose levels will improve Outcome: Progressing

## 2023-10-07 NOTE — Progress Notes (Signed)
   10/07/23 1130  Spiritual Encounters  Type of Visit Initial  Care provided to: Pt and family (Son and Girlfriend at bedside)  Referral source Other (comment) (Girlfriend requested the visit)  Reason for visit Routine spiritual support  OnCall Visit No  Spiritual Framework  Presenting Themes Meaning/purpose/sources of inspiration;Goals in life/care;Values and beliefs  Family Stress Factors Exhausted;Other (Comment) (Son travelled from [place] )  Interventions  Spiritual Care Interventions Made Established relationship of care and support;Compassionate presence;Reflective listening  Intervention Outcomes  Outcomes Connection to spiritual care;Awareness of support;Other (comment) (for Family)

## 2023-10-07 NOTE — Plan of Care (Signed)
  Problem: Elimination: Goal: Will not experience complications related to urinary retention Outcome: Progressing   Problem: Skin Integrity: Goal: Risk for impaired skin integrity will decrease Outcome: Progressing   Problem: Clinical Measurements: Goal: Ability to maintain a body temperature in the normal range will improve Outcome: Progressing   Problem: Fluid Volume: Goal: Ability to maintain a balanced intake and output will improve Outcome: Progressing   Problem: Tissue Perfusion: Goal: Adequacy of tissue perfusion will improve Outcome: Progressing   Problem: Education: Goal: Knowledge of General Education information will improve Description: Including pain rating scale, medication(s)/side effects and non-pharmacologic comfort measures Outcome: Not Progressing   Problem: Health Behavior/Discharge Planning: Goal: Ability to manage health-related needs will improve Outcome: Not Progressing   Problem: Clinical Measurements: Goal: Respiratory complications will improve Outcome: Not Progressing   Problem: Activity: Goal: Risk for activity intolerance will decrease Outcome: Not Progressing   Problem: Nutrition: Goal: Adequate nutrition will be maintained Outcome: Not Progressing   Problem: Coping: Goal: Level of anxiety will decrease Outcome: Not Progressing   Problem: Respiratory: Goal: Ability to maintain adequate ventilation will improve Outcome: Not Progressing Goal: Ability to maintain a clear airway will improve Outcome: Not Progressing   Problem: Coping: Goal: Ability to adjust to condition or change in health will improve Outcome: Not Progressing   Problem: Nutritional: Goal: Maintenance of adequate nutrition will improve Outcome: Not Progressing   Problem: Skin Integrity: Goal: Risk for impaired skin integrity will decrease Outcome: Not Progressing

## 2023-10-08 ENCOUNTER — Ambulatory Visit: Admitting: Family Medicine

## 2023-10-08 ENCOUNTER — Encounter
Admission: EM | Discharge status: Against Medical Advice | Payer: Self-pay | Source: Home / Self Care | Attending: Internal Medicine

## 2023-10-08 ENCOUNTER — Encounter: Payer: Self-pay | Admitting: Gastroenterology

## 2023-10-08 DIAGNOSIS — J9602 Acute respiratory failure with hypercapnia: Secondary | ICD-10-CM | POA: Diagnosis not present

## 2023-10-08 DIAGNOSIS — K220 Achalasia of cardia: Secondary | ICD-10-CM | POA: Diagnosis not present

## 2023-10-08 DIAGNOSIS — T17908A Unspecified foreign body in respiratory tract, part unspecified causing other injury, initial encounter: Secondary | ICD-10-CM | POA: Diagnosis not present

## 2023-10-08 DIAGNOSIS — G9341 Metabolic encephalopathy: Secondary | ICD-10-CM | POA: Diagnosis not present

## 2023-10-08 DIAGNOSIS — Z431 Encounter for attention to gastrostomy: Secondary | ICD-10-CM | POA: Diagnosis not present

## 2023-10-08 DIAGNOSIS — A419 Sepsis, unspecified organism: Secondary | ICD-10-CM | POA: Diagnosis not present

## 2023-10-08 DIAGNOSIS — K219 Gastro-esophageal reflux disease without esophagitis: Secondary | ICD-10-CM | POA: Diagnosis not present

## 2023-10-08 DIAGNOSIS — R509 Fever, unspecified: Secondary | ICD-10-CM

## 2023-10-08 DIAGNOSIS — J189 Pneumonia, unspecified organism: Secondary | ICD-10-CM | POA: Diagnosis not present

## 2023-10-08 DIAGNOSIS — J690 Pneumonitis due to inhalation of food and vomit: Secondary | ICD-10-CM | POA: Diagnosis not present

## 2023-10-08 DIAGNOSIS — Z87898 Personal history of other specified conditions: Secondary | ICD-10-CM | POA: Diagnosis not present

## 2023-10-08 DIAGNOSIS — R6521 Severe sepsis with septic shock: Secondary | ICD-10-CM | POA: Diagnosis not present

## 2023-10-08 DIAGNOSIS — K317 Polyp of stomach and duodenum: Secondary | ICD-10-CM | POA: Diagnosis not present

## 2023-10-08 DIAGNOSIS — I10 Essential (primary) hypertension: Secondary | ICD-10-CM | POA: Diagnosis not present

## 2023-10-08 DIAGNOSIS — J9601 Acute respiratory failure with hypoxia: Secondary | ICD-10-CM | POA: Diagnosis not present

## 2023-10-08 HISTORY — PX: PEG PLACEMENT: SHX5437

## 2023-10-08 LAB — CBC
HCT: 34.1 % — ABNORMAL LOW (ref 39.0–52.0)
Hemoglobin: 11.5 g/dL — ABNORMAL LOW (ref 13.0–17.0)
MCH: 32.7 pg (ref 26.0–34.0)
MCHC: 33.7 g/dL (ref 30.0–36.0)
MCV: 96.9 fL (ref 80.0–100.0)
Platelets: 286 10*3/uL (ref 150–400)
RBC: 3.52 MIL/uL — ABNORMAL LOW (ref 4.22–5.81)
RDW: 13.2 % (ref 11.5–15.5)
WBC: 13.2 10*3/uL — ABNORMAL HIGH (ref 4.0–10.5)
nRBC: 0 % (ref 0.0–0.2)

## 2023-10-08 LAB — BASIC METABOLIC PANEL WITH GFR
Anion gap: 10 (ref 5–15)
BUN: 23 mg/dL (ref 8–23)
CO2: 27 mmol/L (ref 22–32)
Calcium: 8.3 mg/dL — ABNORMAL LOW (ref 8.9–10.3)
Chloride: 105 mmol/L (ref 98–111)
Creatinine, Ser: 0.83 mg/dL (ref 0.61–1.24)
GFR, Estimated: >60 mL/min (ref >60)
Glucose, Bld: 107 mg/dL — ABNORMAL HIGH (ref 70–99)
Potassium: 4 mmol/L (ref 3.5–5.1)
Sodium: 142 mmol/L (ref 135–145)

## 2023-10-08 LAB — GLUCOSE, CAPILLARY
Glucose-Capillary: 102 mg/dL — ABNORMAL HIGH (ref 70–99)
Glucose-Capillary: 108 mg/dL — ABNORMAL HIGH (ref 70–99)
Glucose-Capillary: 112 mg/dL — ABNORMAL HIGH (ref 70–99)
Glucose-Capillary: 118 mg/dL — ABNORMAL HIGH (ref 70–99)
Glucose-Capillary: 94 mg/dL (ref 70–99)
Glucose-Capillary: 99 mg/dL (ref 70–99)

## 2023-10-08 LAB — PHOSPHORUS: Phosphorus: 2.8 mg/dL (ref 2.5–4.6)

## 2023-10-08 LAB — MAGNESIUM: Magnesium: 2.4 mg/dL (ref 1.7–2.4)

## 2023-10-08 MED ORDER — VITAL HIGH PROTEIN PO LIQD
1000.0000 mL | ORAL | Status: DC
  Administered 2023-10-08: 1000 mL

## 2023-10-08 MED ORDER — LACTATED RINGERS IV BOLUS
500.0000 mL | Freq: Once | INTRAVENOUS | Status: AC
Start: 2023-10-08 — End: 2023-10-08
  Administered 2023-10-08: 500 mL via INTRAVENOUS

## 2023-10-08 MED ORDER — LACTATED RINGERS IV BOLUS
500.0000 mL | Freq: Once | INTRAVENOUS | Status: AC
  Administered 2023-10-08: 500 mL via INTRAVENOUS

## 2023-10-08 MED ORDER — PROSOURCE TF20 ENFIT COMPATIBL EN LIQD
60.0000 mL | Freq: Every day | ENTERAL | Status: DC
  Administered 2023-10-09 – 2023-10-12 (×3): 60 mL

## 2023-10-08 MED ORDER — FREE WATER
30.0000 mL | Status: DC
  Administered 2023-10-08 – 2023-10-21 (×55): 30 mL

## 2023-10-08 NOTE — Op Note (Signed)
 [facility]astroenterology Patient Name: Robert Vega Procedure Date: 10/08/2023 12:01 PM MRN: [id number] Account #: [id number] Date of Birth: [birth year] Admit Type: Inpatient Age: 83 Room: [facility] ENDO ROOM 4 Gender: Male Note Status: Finalized Instrument Name: Upper Endoscope [id number] Procedure:             Upper GI endoscopy Indications:           Dysphagia Providers:             Marnee Sink MD, MD Medicines:             Monitored Anesthesia Care Complications:         No immediate complications. Procedure:             Pre-Anesthesia Assessment:                        - Prior to the procedure, a History and Physical was                         performed, and patient medications and allergies were                         reviewed. The patient's tolerance of previous                         anesthesia was also reviewed. The risks and benefits                         of the procedure and the sedation options and risks                         were discussed with the patient. All questions were                         answered, and informed consent was obtained. Prior                         Anticoagulants: The patient has taken no anticoagulant                         or antiplatelet agents. ASA Grade Assessment: III - A                         patient with severe systemic disease. After reviewing                         the risks and benefits, the patient was deemed in                         satisfactory condition to undergo the procedure.                        After obtaining informed consent, the endoscope was                         passed under direct vision. Throughout the procedure,                         the patient's blood pressure,  pulse, and oxygen                         saturations were monitored continuously. The Endoscope                         was introduced through the mouth, and advanced to the                         second part of duodenum.  The upper GI endoscopy was                         accomplished with ease. The patient tolerated the                         procedure well. Findings:      Food was found in the entire esophagus.      Multiple small sessile polyps with no stigmata of recent bleeding were       found in the entire examined stomach.      Placement of an externally removable PEG with no T-fasteners was       successfully completed. The external bumper was at the 3.0 cm marking on       the tube.      The examined duodenum was normal. Impression:            - Food in the esophagus.                        - Multiple gastric polyps.                        - Normal examined duodenum.                        - An externally removable PEG placement was                         successfully completed.                        - No specimens collected. Recommendation:        - Please follow the post-PEG recommendations                         including: Nutrition consult for formula and volume,                         change dressing once per day and NPO x4 hrs then water                         today. Procedure Code(s):     --- Professional ---                        [phone], Esophagogastroduodenoscopy, flexible,                         transoral; with directed placement of percutaneous                         gastrostomy tube Diagnosis  Code(s):     --- Professional ---                        R13.10, Dysphagia, unspecified CPT copyright 2022 American Medical Association. All rights reserved. The codes documented in this report are preliminary and upon coder review may  be revised to meet current compliance requirements. Marnee Sink MD, MD 10/08/2023 12:39:59 PM This report has been signed electronically. Number of Addenda: 0 Note Initiated On: 10/08/2023 12:01 PM Estimated Blood Loss:  Estimated blood loss: none.      [facility]

## 2023-10-08 NOTE — Progress Notes (Signed)
 Palliative Care Progress Note, Assessment & Plan   Patient Name: Robert Vega       Date: 10/08/2023 DOB: 23-Jul-1940  Age: 83 y.o. MRN#: [id number] Attending Physician: Cleve Dale, MD Primary Care Physician: Helaine Llanos, MD Admit Date: 10/05/2023  Subjective: Patient is lying in bed, ventilated and sedated.  His son Robert Vega and significant other are at bedside during my visit.  HPI: 83 y.o. male  with past medical history of dysphagia (s/p EGD 03/2022 with food impaction in upper esophagus-suffered aspiration and cardiac arrest--concern for lack or peristalsis, OSA and HTN admitted from home on 10/05/2023 with hypoxia, fever and generalized weakness.   Admitted initially to [facility] service, unfortunately overnight suffered likely aspiration event of vomitus--transferred to ICU and required mechanical ventilation   5/11: bronch 5/13: PEG placed, patient failed wean trial  Palliative medicine was consulted for assisting with goals of care conversations.  Summary of counseling/coordination of care: Extensive chart review completed prior to meeting patient including labs, vital signs, imaging, progress notes, orders, and available advanced directive documents from current and previous encounters.   After reviewing the patient's chart and assessing the patient at bedside, I spoke with patient's son Robert Vega at bedside in regards to symptom management and goals of care.   Robert Vega is hopeful that patient can be removed from the ventilator today.  Discussed that PEG tube will be placed first and then weaning trial will begin.  Space and opportunity provided for turning to share his thoughts and emotions regarding his father's current medical situation.  Robert Vega shares that he and his family are all on the same page and  communication constantly together.  He is just hopeful that his father can come off of the ventilator soon.  After visiting with the patient and Robert Vega, I spoke with patient's HCPOA/granddaughter Robert Vega over the phone.  Medical update given.  Robert Vega is eager for patient to be extubated.  Discussed that PEG tube placement will be given first and then trial of weaning will begin.  She asked if there was going to be further evaluation from GI.  I conveyed that GIs recommendation is for patient to continue follow-up outpatient with [facility].  No new tests or diagnoses have been made during this hospitalization to determine if change in patient's outpatient GI follow-up.  She was appreciative of this information.  She also requested that home health be able to follow her grandfather at discharge.  Discussed that there are several boundaries between where patient is right now-currently intubated and sedated in ICU-and when patient will be able to be evaluated for PT/OT and have TOC evaluate for discharge planning.  We discussed taking it one day at a time.  Robert Vega was appreciative of the updates.  PMT will continue to follow and support patient and family throughout his hospitalization.  Full code and full scope remain.  Physical Exam Vitals reviewed.  Constitutional:      General: He is not in acute distress.    Appearance: He is not ill-appearing.  HENT:     Mouth/Throat:     Mouth: Mucous membranes are moist.  Cardiovascular:     Rate and Rhythm: Bradycardia present.  Pulmonary:  Comments: Ventilatory support Abdominal:     Palpations: Abdomen is soft.  Skin:    General: Skin is warm and dry.             Total Time 50 minutes   Time spent includes: Detailed review of medical records (labs, imaging, vital signs), medically appropriate exam (mental status, respiratory, cardiac, skin), discussed with treatment team, counseling and educating patient, family and staff, documenting clinical  information, medication management and coordination of care.  Judeen Nose L. Rebbeca Campi, DNP, FNP-BC Palliative Medicine Team

## 2023-10-08 NOTE — Progress Notes (Signed)
 Patient with multiple episodes of food emesis this shift consisting of chunks of undigested food.  Food suctioned from mouth and in line suction multiple times.  NP Lina Render notified.

## 2023-10-08 NOTE — Plan of Care (Signed)
  Problem: Education: Goal: Knowledge of General Education information will improve Description: Including pain rating scale, medication(s)/side effects and non-pharmacologic comfort measures Outcome: Not Progressing   Problem: Health Behavior/Discharge Planning: Goal: Ability to manage health-related needs will improve Outcome: Not Progressing   Problem: Clinical Measurements: Goal: Ability to maintain clinical measurements within normal limits will improve Outcome: Not Progressing Goal: Will remain free from infection Outcome: Not Progressing Goal: Diagnostic test results will improve Outcome: Not Progressing Goal: Respiratory complications will improve Outcome: Not Progressing Goal: Cardiovascular complication will be avoided Outcome: Not Progressing   Problem: Activity: Goal: Risk for activity intolerance will decrease Outcome: Not Progressing   Problem: Nutrition: Goal: Adequate nutrition will be maintained Outcome: Not Progressing   Problem: Coping: Goal: Level of anxiety will decrease Outcome: Not Progressing   Problem: Elimination: Goal: Will not experience complications related to bowel motility Outcome: Not Progressing Goal: Will not experience complications related to urinary retention Outcome: Not Progressing   Problem: Pain Managment: Goal: General experience of comfort will improve and/or be controlled Outcome: Not Progressing   Problem: Safety: Goal: Ability to remain free from injury will improve Outcome: Not Progressing   Problem: Skin Integrity: Goal: Risk for impaired skin integrity will decrease Outcome: Not Progressing   Problem: Activity: Goal: Ability to tolerate increased activity will improve Outcome: Not Progressing   Problem: Clinical Measurements: Goal: Ability to maintain a body temperature in the normal range will improve Outcome: Not Progressing   Problem: Respiratory: Goal: Ability to maintain adequate ventilation will  improve Outcome: Not Progressing Goal: Ability to maintain a clear airway will improve Outcome: Not Progressing   Problem: Education: Goal: Ability to describe self-care measures that may prevent or decrease complications (Diabetes Survival Skills Education) will improve Outcome: Not Progressing Goal: Individualized Educational Video(s) Outcome: Not Progressing   Problem: Coping: Goal: Ability to adjust to condition or change in health will improve Outcome: Not Progressing   Problem: Fluid Volume: Goal: Ability to maintain a balanced intake and output will improve Outcome: Not Progressing   Problem: Health Behavior/Discharge Planning: Goal: Ability to identify and utilize available resources and services will improve Outcome: Not Progressing Goal: Ability to manage health-related needs will improve Outcome: Not Progressing   Problem: Metabolic: Goal: Ability to maintain appropriate glucose levels will improve Outcome: Not Progressing   Problem: Nutritional: Goal: Maintenance of adequate nutrition will improve Outcome: Not Progressing Goal: Progress toward achieving an optimal weight will improve Outcome: Not Progressing   Problem: Skin Integrity: Goal: Risk for impaired skin integrity will decrease Outcome: Not Progressing   Problem: Tissue Perfusion: Goal: Adequacy of tissue perfusion will improve Outcome: Not Progressing   Problem: Activity: Goal: Ability to tolerate increased activity will improve Outcome: Not Progressing   Problem: Respiratory: Goal: Ability to maintain a clear airway and adequate ventilation will improve Outcome: Not Progressing   Problem: Role Relationship: Goal: Method of communication will improve Outcome: Not Progressing

## 2023-10-08 NOTE — Progress Notes (Signed)
 NAME:  Robert Vega, MRN:  [id number], DOB:  November 15, 1940, LOS: 3 ADMISSION DATE:  10/05/2023  History of Present Illness:  83 y.o male with significant PMH of  OSA, GERD, Obesity, HTN, Dysphagia: EGD 03/2022 with food in upper esophagus complicated by aspiration event, cardiac arrest with round of CPR, and post resuscitation EGD with concern for lack of peristalsis. Prior EGD 08/2020 with note of abnormal cricopharyngeus, decrease in motility in esophagus, and spastic LES who presented to the ED from with hypoxia, fever and generalized weakness.   Per ED report, patient's significant other was driving him to urgent care when she had to pull over to call EMS due to worsening symptoms. On EMS arrival, patient was found in the urgent care parking lot lethargic and hypoxic with initial sats in the 80's on RA and fever of 100.7. He was placed on 6L en route to the ED with improvement.   ED Course: Initial vital signs showed HR of beats/minute, BP 110/23mm Hg, the RR 16-mid 20's breaths/minute, and the oxygen saturation 84% on and a temperature of 102.74F (38.9C).  Pertinent Labs/Diagnostics Findings: Na+/ K+: 137/3.5 Glucose:152 BUN/Cr.:21/1.00  WBC: 7.7 K/L without bands or neutrophil predominance  COVID PCR: Negative,  Lactic: 2.2 VBG: pO2 33; pCO2 50; pH 7.38;  HCO3 29.6, %O2 Sat 52.3.  CXR> CTA Chest> see results below Medication administered in the WU:[id number] given 30 cc/kg of fluids and started on broad-spectrum antibiotics Ceftriaxone, Azithromycin and Flagyl for sepsis  secondary to suspected Aspiration Pneumonia.  Disposition:Admitted to medsurg under [facility] service  Pertinent  Medical History  OSA, GERD, Obesity, HTN, Dysphagia: EGD 03/2022 with food in upper esophagus complicated by aspiration event, cardiac arrest with round of CPR, and post resuscitation EGD with concern for lack of peristalsis. Prior EGD 08/2020 with note of abnormal cricopharyngeus, decrease in motility in esophagus,  and spastic LES   Significant Hospital Events: Including procedures, antibiotic start and stop dates in addition to other pertinent events   5/10: Admit to [facility] service with sepsis due to Aspiration Pneumonia.  Course complicated by Acute Respiratory Failure due to Aspiration of vomitus requiring s/p cardiac arrest  transfer to ICU and intubation.  5/12 remains intubated 5/13 remains on vent  Interim History / Subjective:  Remains critically ill Remains intubated Requires VENT support for survival Vent Mode: PRVC FiO2 (%):  [40 %] 40 % Set Rate:  [18 bmp] 18 bmp Vt Set:  [500 mL] 500 mL PEEP:  [8 cmH20] 8 cmH20 Plateau Pressure:  [22 cmH20] 22 cmH20   Objective    Blood pressure (!) 113/58, pulse (!) 55, temperature 97.7 F (36.5 C), temperature source Axillary, resp. rate 19, height 5' 9.02" (1.753 m), weight 107.2 kg, SpO2 95%.    Vent Mode: PRVC FiO2 (%):  [40 %] 40 % Set Rate:  [18 bmp] 18 bmp Vt Set:  [500 mL] 500 mL PEEP:  [8 cmH20] 8 cmH20 Plateau Pressure:  [22 cmH20] 22 cmH20   Intake/Output Summary (Last 24 hours) at 10/08/2023 0721 Last data filed at 10/08/2023 0430 Gross per 24 hour  Intake 952.91 ml  Output 860 ml  Net 92.91 ml   Filed Weights   10/05/23 0457 10/06/23 0000 10/08/23 0500  Weight: (!) 138.6 kg 107.7 kg 107.2 kg    Examination: General:  Chronically ill, frail.  HENT: Supple neck, reactive pupils  Lungs: coarse bilateral air entry  Cardiovascular: Normal S1, Normal S2, RRR  Abdomen: Soft, non tender. Non distended, +BS  Extremities: Warm and well perfused   Labs and imaging were reviewed.   Resolved Hospital Problem list    Assessment & Plan:  83 y.o male with significant PMH of  OSA, GERD, Obesity, HTN, Dysphagia: EGD 03/2022 with food in upper esophagus complicated by aspiration event, cardiac arrest with round of CPR, and post resuscitation EGD with concern for lack of peristalsis. Prior EGD 08/2020 with note of abnormal  cricopharyngeus, decrease in motility in esophagus, and spastic LES who presented to the ED from with hypoxia, fever and generalized weakness.  Severe ACUTE Hypoxic and Hypercapnic Respiratory Failure -continue Mechanical Ventilator support -Wean Fio2 and PEEP as tolerated -VAP/VENT bundle implementation - Wean PEEP & FiO2 as tolerated, maintain SpO2 > 88% - Head of bed elevated 30 degrees, VAP protocol in place - Plateau pressures less than 30 cm H20  - Intermittent chest x-ray & ABG PRN - Ensure adequate pulmonary hygiene  -will perform SAT/SBT after PEG TUBE TODAY  INFECTIOUS DISEASE-ASPIRATION PNEUMONIA -continue antibiotics as prescribed -follow up cultures  SEPTIC shock SOURCE-pneumonia -use vasopressors to keep MAP>65 as needed -follow ABG and LA as needed -follow up cultures -emperic ABX -consider stress dose steroids -aggressive IV fluid Resuscitation   GI Achalasia (CT chest with large hiatal hernia with a markedly dilated esophagus) I recommend PEG tube Follow up GI recs-plan for PEG tube today    NEUROLOGY ACUTE METABOLIC ENCEPHALOPATHY -need for sedation -Goal RASS -2 to -3  RENAL -continue Foley Catheter-assess need -Avoid nephrotoxic agents -Follow urine output, BMP -Ensure adequate renal perfusion, optimize oxygenation -Renal dose medications   Intake/Output Summary (Last 24 hours) at 10/08/2023 0723 Last data filed at 10/08/2023 0430 Gross per 24 hour  Intake 952.91 ml  Output 860 ml  Net 92.91 ml      Latest Ref Rng & Units 10/08/2023    5:58 AM 10/07/2023    3:11 AM 10/06/2023    2:53 AM  BMP  Glucose 70 - 99 mg/dL 161  096  045   BUN 8 - 23 mg/dL 23  21  18    Creatinine 0.61 - 1.24 mg/dL 4.09  8.11  9.14   Sodium 135 - 145 mmol/L 142  140  136   Potassium 3.5 - 5.1 mmol/L 4.0  3.6  3.5   Chloride 98 - 111 mmol/L 105  106  101   CO2 22 - 32 mmol/L 27  25  25    Calcium 8.9 - 10.3 mg/dL 8.3  7.9  7.7     ENDO - ICU  hypoglycemic\Hyperglycemia protocol -check FSBS per protocol   GI GI PROPHYLAXIS as indicated NUTRITIONAL STATUS DIET-->NPO Constipation protocol as indicated   ELECTROLYTES -follow labs as needed -replace as needed -pharmacy consultation and following  RESTRICTIVE TRANSFUSION PROTOCOL TRANSFUSION  IF HGB<7  or ACTIVE BLEEDING OR DX of ACUTE CORONARY SYNDROMES   Best Practice (right click and "Reselect all SmartList Selections" daily)   Diet/type: NPO DVT prophylaxis LMWH Pressure ulcer(s): N/A GI prophylaxis: PPI Lines: N/A Foley:  N/A Code Status:  full code Last date of multidisciplinary goals of care discussion [10/06/2023]     DVT/GI PRX  assessed I Assessed the need for Labs I Assessed the need for Foley I Assessed the need for Central Venous Line Family Discussion when available I Assessed the need for Mobilization I made an Assessment of medications to be adjusted accordingly Safety Risk assessment completed  CASE DISCUSSED IN MULTIDISCIPLINARY ROUNDS WITH ICU TEAM     Critical Care Time  devoted to patient care services described in this note is 55 minutes.  Critical care was necessary to treat /prevent imminent and life-threatening deterioration. Overall, patient is critically ill, prognosis is guarded.  Patient with Multiorgan failure and at high risk for cardiac arrest and death.    Lady Pier, M.D.  [employer] [facility]  Medical Director [facility] [place] [facility] Medical Director [facility] Cardio-Pulmonary Department

## 2023-10-08 NOTE — IPAL (Signed)
  Interdisciplinary Goals of Care Family Meeting   Date carried out: 10/08/2023  Location of the meeting: Bedside  Member's involved: Physician, Bedside Registered Nurse, and Family Member or next of kin    GOALS OF CARE DISCUSSION  The Clinical status was relayed to family in detail- Son and Friend at Bedside  Updated and notified of patients medical condition- Patient remains unresponsive and will not open eyes to command.   Patient with increased WOB and using accessory muscles to breathe Explained to family course of therapy and the modalities  Patient with Progressive multiorgan failure with a very high probablity of a very minimal chance of meaningful recovery despite all aggressive and optimal medical therapy.    PATIENT REMAINS FULL CODE Failed weaning trials Will re-attempt in next 24 hrs   Family understands the situation.   Family are satisfied with Plan of action and management. All questions answered  Additional CC time 35 mins   Jamariya Davidoff Nestora Baptise, M.D.  [employer] [facility]  Medical Director [facility] Medical Director [facility] Cardio-Pulmonary Department

## 2023-10-09 ENCOUNTER — Inpatient Hospital Stay

## 2023-10-09 DIAGNOSIS — I517 Cardiomegaly: Secondary | ICD-10-CM | POA: Diagnosis not present

## 2023-10-09 DIAGNOSIS — T17908A Unspecified foreign body in respiratory tract, part unspecified causing other injury, initial encounter: Secondary | ICD-10-CM | POA: Diagnosis not present

## 2023-10-09 DIAGNOSIS — K219 Gastro-esophageal reflux disease without esophagitis: Secondary | ICD-10-CM | POA: Diagnosis not present

## 2023-10-09 DIAGNOSIS — J9602 Acute respiratory failure with hypercapnia: Secondary | ICD-10-CM | POA: Diagnosis not present

## 2023-10-09 DIAGNOSIS — A419 Sepsis, unspecified organism: Secondary | ICD-10-CM | POA: Diagnosis not present

## 2023-10-09 DIAGNOSIS — K220 Achalasia of cardia: Secondary | ICD-10-CM | POA: Diagnosis not present

## 2023-10-09 DIAGNOSIS — K224 Dyskinesia of esophagus: Secondary | ICD-10-CM

## 2023-10-09 DIAGNOSIS — J189 Pneumonia, unspecified organism: Secondary | ICD-10-CM | POA: Diagnosis not present

## 2023-10-09 DIAGNOSIS — G9341 Metabolic encephalopathy: Secondary | ICD-10-CM | POA: Diagnosis not present

## 2023-10-09 DIAGNOSIS — J690 Pneumonitis due to inhalation of food and vomit: Secondary | ICD-10-CM | POA: Diagnosis not present

## 2023-10-09 DIAGNOSIS — J9601 Acute respiratory failure with hypoxia: Secondary | ICD-10-CM | POA: Diagnosis not present

## 2023-10-09 DIAGNOSIS — Z87898 Personal history of other specified conditions: Secondary | ICD-10-CM

## 2023-10-09 DIAGNOSIS — I10 Essential (primary) hypertension: Secondary | ICD-10-CM | POA: Diagnosis not present

## 2023-10-09 DIAGNOSIS — R918 Other nonspecific abnormal finding of lung field: Secondary | ICD-10-CM | POA: Diagnosis not present

## 2023-10-09 DIAGNOSIS — Z931 Gastrostomy status: Secondary | ICD-10-CM | POA: Diagnosis not present

## 2023-10-09 DIAGNOSIS — R1110 Vomiting, unspecified: Secondary | ICD-10-CM | POA: Diagnosis not present

## 2023-10-09 DIAGNOSIS — R6521 Severe sepsis with septic shock: Secondary | ICD-10-CM | POA: Diagnosis not present

## 2023-10-09 DIAGNOSIS — Z4682 Encounter for fitting and adjustment of non-vascular catheter: Secondary | ICD-10-CM | POA: Diagnosis not present

## 2023-10-09 LAB — CBC
HCT: 34.7 % — ABNORMAL LOW (ref 39.0–52.0)
Hemoglobin: 11.8 g/dL — ABNORMAL LOW (ref 13.0–17.0)
MCH: 32.8 pg (ref 26.0–34.0)
MCHC: 34 g/dL (ref 30.0–36.0)
MCV: 96.4 fL (ref 80.0–100.0)
Platelets: 278 10*3/uL (ref 150–400)
RBC: 3.6 MIL/uL — ABNORMAL LOW (ref 4.22–5.81)
RDW: 13.2 % (ref 11.5–15.5)
WBC: 11.6 10*3/uL — ABNORMAL HIGH (ref 4.0–10.5)
nRBC: 0 % (ref 0.0–0.2)

## 2023-10-09 LAB — CULTURE, RESPIRATORY W GRAM STAIN

## 2023-10-09 LAB — GLUCOSE, CAPILLARY
Glucose-Capillary: 103 mg/dL — ABNORMAL HIGH (ref 70–99)
Glucose-Capillary: 110 mg/dL — ABNORMAL HIGH (ref 70–99)
Glucose-Capillary: 118 mg/dL — ABNORMAL HIGH (ref 70–99)
Glucose-Capillary: 131 mg/dL — ABNORMAL HIGH (ref 70–99)
Glucose-Capillary: 96 mg/dL (ref 70–99)
Glucose-Capillary: 97 mg/dL (ref 70–99)

## 2023-10-09 LAB — BASIC METABOLIC PANEL WITH GFR
Anion gap: 9 (ref 5–15)
BUN: 27 mg/dL — ABNORMAL HIGH (ref 8–23)
CO2: 25 mmol/L (ref 22–32)
Calcium: 8 mg/dL — ABNORMAL LOW (ref 8.9–10.3)
Chloride: 106 mmol/L (ref 98–111)
Creatinine, Ser: 0.84 mg/dL (ref 0.61–1.24)
GFR, Estimated: >60 mL/min (ref >60)
Glucose, Bld: 102 mg/dL — ABNORMAL HIGH (ref 70–99)
Potassium: 3.7 mmol/L (ref 3.5–5.1)
Sodium: 140 mmol/L (ref 135–145)

## 2023-10-09 LAB — THYROID PANEL WITH TSH
Free Thyroxine Index: 1.7 (ref 1.2–4.9)
T3 Uptake Ratio: 30 % (ref 24–39)
T4, Total: 5.5 ug/dL (ref 4.5–12.0)
TSH: 0.863 u[IU]/mL (ref 0.450–4.500)

## 2023-10-09 LAB — MAGNESIUM: Magnesium: 2.4 mg/dL (ref 1.7–2.4)

## 2023-10-09 LAB — TRIGLYCERIDES: Triglycerides: 180 mg/dL — ABNORMAL HIGH (ref <150)

## 2023-10-09 LAB — PHOSPHORUS: Phosphorus: 3.2 mg/dL (ref 2.5–4.6)

## 2023-10-09 MED ORDER — HYDROCODONE-ACETAMINOPHEN 7.5-325 MG PO TABS
1.0000 | ORAL_TABLET | Freq: Four times a day (QID) | ORAL | Status: DC | PRN
  Administered 2023-10-09 – 2023-10-11 (×2): 1
  Filled 2023-10-09 (×2): qty 1

## 2023-10-09 MED ORDER — TRAZODONE HCL 50 MG PO TABS
50.0000 mg | ORAL_TABLET | Freq: Every day | ORAL | Status: DC
  Administered 2023-10-09: 50 mg
  Filled 2023-10-09: qty 1

## 2023-10-09 MED ORDER — ACETAMINOPHEN 325 MG PO TABS
650.0000 mg | ORAL_TABLET | Freq: Four times a day (QID) | ORAL | Status: DC | PRN
  Administered 2023-10-13: 650 mg
  Filled 2023-10-09: qty 2

## 2023-10-09 MED ORDER — DOCUSATE SODIUM 50 MG/5ML PO LIQD
100.0000 mg | Freq: Two times a day (BID) | ORAL | Status: DC
  Administered 2023-10-09 – 2023-10-21 (×21): 100 mg
  Filled 2023-10-09 (×24): qty 10

## 2023-10-09 MED ORDER — FENTANYL CITRATE PF 50 MCG/ML IJ SOSY
12.5000 ug | PREFILLED_SYRINGE | Freq: Once | INTRAMUSCULAR | Status: AC
  Administered 2023-10-09: 12.5 ug via INTRAVENOUS
  Filled 2023-10-09: qty 1

## 2023-10-09 MED ORDER — TRAZODONE HCL 50 MG PO TABS: 50.0000 mg | ORAL_TABLET | Freq: Every day | ORAL | Status: DC

## 2023-10-09 NOTE — Plan of Care (Signed)
 Problem: Education: Goal: Knowledge of General Education information will improve Description: Including pain rating scale, medication(s)/side effects and non-pharmacologic comfort measures 10/09/2023 1725 by Cyndia Drape, RN Outcome: Not Progressing 10/09/2023 1725 by Cyndia Drape, RN Outcome: Not Progressing   Problem: Health Behavior/Discharge Planning: Goal: Ability to manage health-related needs will improve 10/09/2023 1725 by Cyndia Drape, RN Outcome: Not Progressing 10/09/2023 1725 by Cyndia Drape, RN Outcome: Not Progressing   Problem: Clinical Measurements: Goal: Ability to maintain clinical measurements within normal limits will improve 10/09/2023 1725 by Cyndia Drape, RN Outcome: Not Progressing 10/09/2023 1725 by Cyndia Drape, RN Outcome: Not Progressing Goal: Will remain free from infection 10/09/2023 1725 by Cyndia Drape, RN Outcome: Not Progressing 10/09/2023 1725 by Cyndia Drape, RN Outcome: Not Progressing Goal: Diagnostic test results will improve 10/09/2023 1725 by Cyndia Drape, RN Outcome: Not Progressing 10/09/2023 1725 by Cyndia Drape, RN Outcome: Not Progressing Goal: Respiratory complications will improve 10/09/2023 1725 by Cyndia Drape, RN Outcome: Not Progressing 10/09/2023 1725 by Cyndia Drape, RN Outcome: Not Progressing Goal: Cardiovascular complication will be avoided 10/09/2023 1725 by Cyndia Drape, RN Outcome: Not Progressing 10/09/2023 1725 by Cyndia Drape, RN Outcome: Not Progressing   Problem: Activity: Goal: Risk for activity intolerance will decrease 10/09/2023 1725 by Cyndia Drape, RN Outcome: Not Progressing 10/09/2023 1725 by Cyndia Drape, RN Outcome: Not Progressing   Problem: Nutrition: Goal: Adequate nutrition will be maintained 10/09/2023 1725 by Cyndia Drape, RN Outcome: Not Progressing 10/09/2023 1725 by Cyndia Drape, RN Outcome: Not Progressing   Problem: Coping: Goal: Level of anxiety will  decrease 10/09/2023 1725 by Cyndia Drape, RN Outcome: Not Progressing 10/09/2023 1725 by Cyndia Drape, RN Outcome: Not Progressing   Problem: Elimination: Goal: Will not experience complications related to bowel motility 10/09/2023 1725 by Cyndia Drape, RN Outcome: Not Progressing 10/09/2023 1725 by Cyndia Drape, RN Outcome: Not Progressing Goal: Will not experience complications related to urinary retention 10/09/2023 1725 by Cyndia Drape, RN Outcome: Not Progressing 10/09/2023 1725 by Cyndia Drape, RN Outcome: Not Progressing   Problem: Pain Managment: Goal: General experience of comfort will improve and/or be controlled 10/09/2023 1725 by Cyndia Drape, RN Outcome: Not Progressing 10/09/2023 1725 by Cyndia Drape, RN Outcome: Not Progressing   Problem: Safety: Goal: Ability to remain free from injury will improve 10/09/2023 1725 by Cyndia Drape, RN Outcome: Not Progressing 10/09/2023 1725 by Cyndia Drape, RN Outcome: Not Progressing   Problem: Skin Integrity: Goal: Risk for impaired skin integrity will decrease Outcome: Not Progressing   Problem: Activity: Goal: Ability to tolerate increased activity will improve Outcome: Not Progressing   Problem: Clinical Measurements: Goal: Ability to maintain a body temperature in the normal range will improve Outcome: Not Progressing   Problem: Respiratory: Goal: Ability to maintain adequate ventilation will improve Outcome: Not Progressing Goal: Ability to maintain a clear airway will improve Outcome: Not Progressing   Problem: Education: Goal: Ability to describe self-care measures that may prevent or decrease complications (Diabetes Survival Skills Education) will improve Outcome: Not Progressing Goal: Individualized Educational Video(s) Outcome: Not Progressing   Problem: Coping: Goal: Ability to adjust to condition or change in health will improve Outcome: Not Progressing   Problem: Fluid Volume: Goal:  Ability to maintain a balanced intake and output will improve Outcome: Not Progressing   Problem: Health Behavior/Discharge Planning: Goal: Ability to identify and utilize available resources and services will improve Outcome: Not Progressing Goal: Ability to manage health-related needs will improve Outcome: Not Progressing   Problem: Metabolic: Goal: Ability to maintain appropriate glucose  levels will improve Outcome: Not Progressing   Problem: Nutritional: Goal: Maintenance of adequate nutrition will improve Outcome: Not Progressing Goal: Progress toward achieving an optimal weight will improve Outcome: Not Progressing   Problem: Skin Integrity: Goal: Risk for impaired skin integrity will decrease Outcome: Not Progressing   Problem: Tissue Perfusion: Goal: Adequacy of tissue perfusion will improve Outcome: Not Progressing   Problem: Activity: Goal: Ability to tolerate increased activity will improve Outcome: Not Progressing   Problem: Respiratory: Goal: Ability to maintain a clear airway and adequate ventilation will improve Outcome: Not Progressing   Problem: Role Relationship: Goal: Method of communication will improve Outcome: Not Progressing

## 2023-10-09 NOTE — Progress Notes (Signed)
 NAME:  Robert Vega, MRN:  [id number], DOB:  [birth year], LOS: 4 ADMISSION DATE:  10/05/2023  History of Present Illness:  83 y.o male with significant PMH of  OSA, GERD, Obesity, HTN, Dysphagia: EGD 03/2022 with food in upper esophagus complicated by aspiration event, cardiac arrest with round of CPR, and post resuscitation EGD with concern for lack of peristalsis. Prior EGD 08/2020 with note of abnormal cricopharyngeus, decrease in motility in esophagus, and spastic LES who presented to the ED from with hypoxia, fever and generalized weakness.   Per ED report, patient's significant other was driving him to urgent care when she had to pull over to call EMS due to worsening symptoms. On EMS arrival, patient was found in the urgent care parking lot lethargic and hypoxic with initial sats in the 80's on RA and fever of 100.7. He was placed on 6L en route to the ED with improvement.   ED Course: Initial vital signs showed HR of beats/minute, BP 110/34mm Hg, the RR 16-mid 20's breaths/minute, and the oxygen saturation 84% on and a temperature of 102.3F (38.9C).  Pertinent Labs/Diagnostics Findings: Na+/ K+: 137/3.5 Glucose:152 BUN/Cr.:21/1.00  WBC: 7.7 K/L without bands or neutrophil predominance  COVID PCR: Negative,  Lactic: 2.2 VBG: pO2 33; pCO2 50; pH 7.38;  HCO3 29.6, %O2 Sat 52.3.  CXR> CTA Chest> see results below Medication administered in the QQ:VZDGLOV given 30 cc/kg of fluids and started on broad-spectrum antibiotics Ceftriaxone, Azithromycin and Flagyl for sepsis  secondary to suspected Aspiration Pneumonia.  Disposition:Admitted to medsurg under [facility] service  Pertinent  Medical History  OSA, GERD, Obesity, HTN, Dysphagia: EGD 03/2022 with food in upper esophagus complicated by aspiration event, cardiac arrest with round of CPR, and post resuscitation EGD with concern for lack of peristalsis. Prior EGD 08/2020 with note of abnormal cricopharyngeus, decrease in motility in esophagus,  and spastic LES   Significant Hospital Events: Including procedures, antibiotic start and stop dates in addition to other pertinent events   5/10: Admit to [facility] service with sepsis due to Aspiration Pneumonia.  Course complicated by Acute Respiratory Failure due to Aspiration of vomitus requiring s/p cardiac arrest  transfer to ICU and intubation.  5/12 remains intubated 5/13 remains on vent, s/p PEG tube, +vomiting last night, failed SAT/SBT  Interim History / Subjective:  Remains critically ill Remains intubated Requires VENT support for survival Vent Mode: PRVC FiO2 (%):  [35 %-40 %] 35 % Set Rate:  [18 bmp] 18 bmp Vt Set:  [500 mL] 500 mL PEEP:  [8 cmH20] 8 cmH20 Pressure Support:  [5 cmH20] 5 cmH20 Plateau Pressure:  [22 cmH20-24 cmH20] 24 cmH20   Objective    Blood pressure (!) 155/67, pulse (!) 41, temperature (!) 97.3 F (36.3 C), temperature source Axillary, resp. rate 18, height 5' 9.02" (1.753 m), weight 102 kg, SpO2 95%.    Vent Mode: PRVC FiO2 (%):  [35 %-40 %] 35 % Set Rate:  [18 bmp] 18 bmp Vt Set:  [500 mL] 500 mL PEEP:  [8 cmH20] 8 cmH20 Pressure Support:  [5 cmH20] 5 cmH20 Plateau Pressure:  [22 cmH20-24 cmH20] 24 cmH20   Intake/Output Summary (Last 24 hours) at 10/09/2023 0729 Last data filed at 10/09/2023 0645 Gross per 24 hour  Intake 1305.32 ml  Output 1100 ml  Net 205.32 ml   Filed Weights   10/06/23 0000 10/08/23 0500 10/09/23 5643  Weight: 107.7 kg 107.2 kg 102 kg     REVIEW OF SYSTEMS  PATIENT IS  UNABLE TO PROVIDE COMPLETE REVIEW OF SYSTEMS DUE TO SEVERE CRITICAL ILLNESS   PHYSICAL EXAMINATION:  GENERAL:critically ill appearing, +resp distress EYES: Pupils equal, round, reactive to light.  No scleral icterus.  MOUTH: Moist mucosal membrane. INTUBATED NECK: Supple.  PULMONARY: Lungs clear to auscultation, +rhonchi, +wheezing CARDIOVASCULAR: S1 and S2.  Regular rate and rhythm GASTROINTESTINAL: Soft, nontender, -distended. Positive bowel  sounds.  MUSCULOSKELETAL: No swelling, clubbing, or edema.  NEUROLOGIC: obtunded,sedated SKIN:normal, warm to touch, Capillary refill delayed  Pulses present bilaterally    Assessment & Plan:  83 y.o male with significant PMH of  OSA, GERD, Obesity, HTN, Dysphagia: EGD 03/2022 with food in upper esophagus complicated by aspiration event, cardiac arrest with round of CPR, and post resuscitation EGD with concern for lack of peristalsis. Prior EGD 08/2020 with note of abnormal cricopharyngeus, decrease in motility in esophagus, and spastic LES who presented to the ED from with hypoxia, fever and generalized weakness. Leading to acute hypoxic resp failure and failure to wean from ventilator  Severe ACUTE Hypoxic and Hypercapnic Respiratory Failure -continue Mechanical Ventilator support -Wean Fio2 and PEEP as tolerated -VAP/VENT bundle implementation - Wean PEEP & FiO2 as tolerated, maintain SpO2 > 88% - Head of bed elevated 30 degrees, VAP protocol in place - Plateau pressures less than 30 cm H20  - Intermittent chest x-ray & ABG PRN - Ensure adequate pulmonary hygiene  -will perform SAT/SBT when respiratory parameters are met, failed SAT 5/13 will try again today   INFECTIOUS DISEASE-ASPIRATION PNEUMONIA -continue antibiotics as prescribed -follow up cultures  SEPTIC shock SOURCE-pneumonia -use vasopressors to keep MAP>65 as needed -follow ABG and LA as needed -follow up cultures -emperic ABX   GI Achalasia (CT chest with large hiatal hernia with a markedly dilated esophagus) S/p PEG tube Follow up GI recs-   NEUROLOGY ACUTE METABOLIC ENCEPHALOPATHY -need for sedation -Goal RASS -2 to -3  RENAL -continue Foley Catheter-assess need -Avoid nephrotoxic agents -Follow urine output, BMP -Ensure adequate renal perfusion, optimize oxygenation -Renal dose medications   Intake/Output Summary (Last 24 hours) at 10/09/2023 0732 Last data filed at 10/09/2023 0645 Gross per 24  hour  Intake 1305.32 ml  Output 1100 ml  Net 205.32 ml      Latest Ref Rng & Units 10/09/2023    4:42 AM 10/08/2023    5:58 AM 10/07/2023    3:11 AM  BMP  Glucose 70 - 99 mg/dL 578  469  629   BUN 8 - 23 mg/dL 27  23  21    Creatinine 0.61 - 1.24 mg/dL 5.28  4.13  2.44   Sodium 135 - 145 mmol/L 140  142  140   Potassium 3.5 - 5.1 mmol/L 3.7  4.0  3.6   Chloride 98 - 111 mmol/L 106  105  106   CO2 22 - 32 mmol/L 25  27  25    Calcium 8.9 - 10.3 mg/dL 8.0  8.3  7.9     ENDO - ICU hypoglycemic\Hyperglycemia protocol -check FSBS per protocol   GI GI PROPHYLAXIS as indicated NUTRITIONAL STATUS DIET-->TF's as tolerated Constipation protocol as indicated   ELECTROLYTES -follow labs as needed -replace as needed -pharmacy consultation and following  RESTRICTIVE TRANSFUSION PROTOCOL TRANSFUSION  IF HGB<7  or ACTIVE BLEEDING OR DX of ACUTE CORONARY SYNDROMES   Best Practice (right click and "Reselect all SmartList Selections" daily)   Diet/type: NPO DVT prophylaxis LMWH Pressure ulcer(s): N/A GI prophylaxis: PPI Lines: N/A Foley:  N/A Code Status:  full code  Last date of multidisciplinary goals of care discussion [10/06/2023]     DVT/GI PRX  assessed I Assessed the need for Labs I Assessed the need for Foley I Assessed the need for Central Venous Line Family Discussion when available I Assessed the need for Mobilization I made an Assessment of medications to be adjusted accordingly Safety Risk assessment completed  CASE DISCUSSED IN MULTIDISCIPLINARY ROUNDS WITH ICU TEAM     Critical Care Time devoted to patient care services described in this note is 55 minutes.  Critical care was necessary to treat /prevent imminent and life-threatening deterioration. Overall, patient is critically ill, prognosis is guarded.  Patient with Multiorgan failure and at high risk for cardiac arrest and death.    Lady Pier, M.D.  [facility]  Medicine  Medical Director [facility] Medical Director [facility] Cardio-Pulmonary Department

## 2023-10-09 NOTE — Progress Notes (Signed)
 Palliative Care Progress Note, Assessment & Plan   Patient Name: Robert Vega       Date: 10/09/2023 DOB: 1941/02/05  Age: 83 y.o. MRN#: [id number] Attending Physician: Cleve Dale, MD Primary Care Physician: Helaine Llanos, MD Admit Date: 10/05/2023  Subjective: Patient is lying in bed intubated with sedation off.  His son Robert Vega is at bedside during my visit.  HPI: 83 y.o. male  with past medical history of dysphagia (s/p EGD 03/2022 with food impaction in upper esophagus-suffered aspiration and cardiac arrest--concern for lack or peristalsis, OSA and HTN admitted from home on 10/05/2023 with hypoxia, fever and generalized weakness.   Admitted initially to [facility] service, unfortunately overnight suffered likely aspiration event of vomitus--transferred to ICU and required mechanical ventilation   5/11: bronch 5/13: PEG placed, patient failed wean trial 5/14: vomiting X 2 overnight,    Palliative medicine was consulted for assisting with goals of care conversations.  Summary of counseling/coordination of care: Extensive chart review completed prior to meeting patient including labs, vital signs, imaging, progress notes, orders, and available advanced directive documents from current and previous encounters.   After reviewing the patient's chart and assessing the patient at bedside, I met with patient's son Robert Vega and Dr. Auston Left to discuss next steps and plan of care.  Medical update given, including 2 vomiting episodes.  Discussed potential for neurological issues, such as stroke or anoxic injury.  CCM plans to get an MRI.   Patiently, discussed patient has failed weaning trial.  Wake up assessment will be performed again today.  However, discussed great potential that patient will necessitate a  tracheostomy.  Discussed risk and benefits of tracheostomy.  Reviewed it would be protecting patient's airway but could not completely rule out aspiration.  Discussed patient would be n.p.o. and have feeding tube provide nutrition.  However, tube feeds can still be aspirated into the lungs.  Long-term plan reviewed with Robert Vega.  Robert Vega shares patient is stubborn and independent.  He believes patient would not want to [place].  However, Robert Vega recognizes that patient's level of care needed would require either rehab for short-term or long-term.  Discussed there are several barriers to needing to plan discharge at this point.  Patient needs to be successfully extubated or have a trach placed.  Neurological function also needs to be evaluated. Space and opportunity provided for Robert Vega to ask questions and voiced his concerns.  Robert Vega shares concern that he did not know how serious patient's esophageal issues were.  Reviewed [facility]'s visits with Robert Vega.  Discussed that patient's next of kin and Robert Vega is his granddaughter Robert Vega.  Robert Vega's plans to speak with Robert Vega today.  CCM and PMT will continue to follow and support patient and family.  Physical Exam Vitals reviewed.  Constitutional:      Appearance: He is obese. He is not toxic-appearing.  HENT:     Head: Normocephalic.     Mouth/Throat:     Mouth: Mucous membranes are moist.  Pulmonary:     Comments: Ventilatory support Abdominal:     Palpations: Abdomen is soft.  Musculoskeletal:     Comments: Generalized weakness  Skin:    General:  Skin is warm and dry.             Total Time 50 minutes   Time spent includes: Detailed review of medical records (labs, imaging, vital signs), medically appropriate exam (mental status, respiratory, cardiac, skin), discussed with treatment team, counseling and educating patient, family and staff, documenting clinical information, medication management and coordination of care.  Robert Nose  L. Rebbeca Campi, DNP, FNP-BC Palliative Medicine Team

## 2023-10-09 NOTE — Progress Notes (Signed)
 Nutrition Follow Up Note   DOCUMENTATION CODES:   Obesity unspecified  INTERVENTION:   Once tube feeds resumed:  Vital HP @60ml /hr- Initiate at 100ml/hr and increase by 10ml/hr q 8 hours until goal rate is reached.   ProSource TF 20- Give 60ml daily via tube, each supplement provides 80kcal and 20g of protein.   Free water flushes 30ml q4 hours to maintain tube patency   Regimen provides 1520kcal/day, 146g/day protein and 138ml/day of free water.   Pt at high refeed risk; recommend monitor potassium, magnesium and phosphorus labs daily until stable  Daily weights   NUTRITION DIAGNOSIS:   Inadequate oral intake related to inability to eat (pt sedated and ventilated) as evidenced by NPO status.  GOAL:   Provide needs based on ASPEN/SCCM guidelines -not met   MONITOR:   Vent status, Labs, Weight trends, TF tolerance, I & O's, Skin  ASSESSMENT:   83 y/o male with h/o GERD, esophageal dysphagia, HTN, BPH, mood disorder and hiatal hernia who is admitted with aspiration event, PNA, sepsis, cardiac arrest and dysphagia.  -Pt s/p EGD 5/13; found to have food in the entire esophagus and gastric polyps. PEG tube placed   Pt initiated on trickle tube feeds yesterday. Pt tolerating tube feeds well overnight but was noted to have undigested food chunks coming up from his esophagus. No tube feeding formula noted to be coming back up. Hold tube feeds today per MD. Pt is now without adequate nutrition for > 5 days. Pt is at high refeed risk. Will plan to resume tube feeds once ok per MD. No BM noted since 5/9; RN and NP aware. Per chart, pt is down ~12lbs since admission. Pt +1.2L on his I & Os. Palliative care is following.   Medications reviewed and include: colace, lovenox , insulin, solu-medrol, protonix , miralax, ceftriaxone, pepcid  Labs reviewed: K 3.7 wnl, BUN 27(H), P 3.2 wnl, Mg 2.4 wnl Wbc- 11.6(H), Hgb 11.8(L), Hct 34.7(L) Cbgs- 97, 96, 110 x 24 hrs   Patient is currently  intubated on ventilator support MV: 7.8 L/min Temp (24hrs), Avg:98.3 F (36.8 C), Min:97.3 F (36.3 C), Max:99.6 F (37.6 C)  Propofol : stopped   MAP >33mmHg   UOP-   Diet Order:   Diet Order             Diet NPO time specified  Diet effective now                  EDUCATION NEEDS:   No education needs have been identified at this time  Skin:  Skin Assessment: Reviewed RN Assessment (skin tear arm)  Last BM:  5/9  Height:   Ht Readings from Last 1 Encounters:  10/08/23 5' 9.02" (1.753 m)    Weight:   Wt Readings from Last 1 Encounters:  10/09/23 102 kg    Ideal Body Weight:  72.7 kg  BMI:  Body mass index is 33.19 kg/m.  Estimated Nutritional Needs:   Kcal:  1185-1508kcal/day  Protein:  >145g/day  Fluid:  1.9-2.2L/day  Torrance Freestone MS, RD, LDN If unable to be reached, please send secure chat to "RD inpatient" available from 8:00a-4:00p daily

## 2023-10-09 NOTE — Progress Notes (Signed)
 Marnee Sink, MD [facility]   [address], [address] [place], [place] [address] Phone: [phone] Fax : [phone]   Subjective: The patient had a PEG tube placed yesterday with the EGD showing the esophagus completely filled from top to bottom with food.  The stomach was devoid of any food.  The PEG tube was placed without difficulty.  The patient subsequently had a large amount of solid food emesis with chunks of undigested food suctioned from the mouth.   Objective: Vital signs in last 24 hours: Vitals:   10/09/23 0637 10/09/23 0700 10/09/23 0753 10/09/23 0800  BP:  (!) 167/70  (!) 171/76  Pulse:  (!) 42  (!) 49  Resp:  18  18  Temp:    98.2 F (36.8 C)  TempSrc:    Axillary  SpO2:  95% 94% 94%  Weight: 102 kg     Height:       Weight change: -5.2 kg  Intake/Output Summary (Last 24 hours) at 10/09/2023 1308 Last data filed at 10/09/2023 0801 Gross per 24 hour  Intake 1245.43 ml  Output 1075 ml  Net 170.43 ml     Exam: Heart:: Regular rate and rhythm or without murmur or extra heart sounds Lungs: normal and clear to auscultation and percussion Abdomen: Soft nontender with the PEG site looking good.   Lab Results: @LABTEST2 @ Micro Results: Recent Results (from the past 240 hours)  Blood Culture (routine x 2)     Status: None (Preliminary result)   Collection Time: 10/05/23  5:06 AM   Specimen: BLOOD  Result Value Ref Range Status   Specimen Description BLOOD LA  Final   Special Requests   Final    BOTTLES DRAWN AEROBIC AND ANAEROBIC Blood Culture results may not be optimal due to an inadequate volume of blood received in culture bottles   Culture   Final    NO GROWTH 4 DAYS Performed at [facility], [address], [place], [place] [address]    Report Status PENDING  Incomplete  Blood Culture (routine x 2)     Status: None (Preliminary result)   Collection Time: 10/05/23  5:07 AM   Specimen: BLOOD  Result Value Ref Range Status   Specimen  Description BLOOD RA  Final   Special Requests   Final    BOTTLES DRAWN AEROBIC AND ANAEROBIC Blood Culture results may not be optimal due to an inadequate volume of blood received in culture bottles   Culture   Final    NO GROWTH 4 DAYS Performed at [facility], [address], [place], [place] [address]    Report Status PENDING  Incomplete  Resp panel by RT-PCR (RSV, Flu A&B, Covid) Anterior Nasal Swab     Status: None   Collection Time: 10/05/23  5:56 AM   Specimen: Anterior Nasal Swab  Result Value Ref Range Status   SARS Coronavirus 2 by RT PCR NEGATIVE NEGATIVE Final    Comment: (NOTE) SARS-CoV-2 target nucleic acids are NOT DETECTED.  The SARS-CoV-2 RNA is generally detectable in upper respiratory specimens during the acute phase of infection. The lowest concentration of SARS-CoV-2 viral copies this assay can detect is 138 copies/mL. A negative result does not preclude SARS-Cov-2 infection and should not be used as the sole basis for treatment or other patient management decisions. A negative result may occur with  improper specimen collection/handling, submission of specimen other than nasopharyngeal swab, presence of viral mutation(s) within the areas targeted by this assay, and  inadequate number of viral copies(<138 copies/mL). A negative result must be combined with clinical observations, patient history, and epidemiological information. The expected result is Negative.  Fact Sheet for Patients:  BloggerCourse.com  Fact Sheet for Healthcare Providers:  SeriousBroker.it  This test is no t yet approved or cleared by the [place]  FDA and  has been authorized for detection and/or diagnosis of SARS-CoV-2 by FDA under an Emergency Use Authorization (EUA). This EUA will remain  in effect (meaning this test can be used) for the duration of the COVID-19 declaration under Section 564(b)(1) of the Act,  21 U.S.C.section 360bbb-3(b)(1), unless the authorization is terminated  or revoked sooner.       Influenza A by PCR NEGATIVE NEGATIVE Final   Influenza B by PCR NEGATIVE NEGATIVE Final    Comment: (NOTE) The Xpert Xpress SARS-CoV-2/FLU/RSV plus assay is intended as an aid in the diagnosis of influenza from Nasopharyngeal swab specimens and should not be used as a sole basis for treatment. Nasal washings and aspirates are unacceptable for Xpert Xpress SARS-CoV-2/FLU/RSV testing.  Fact Sheet for Patients: BloggerCourse.com  Fact Sheet for Healthcare Providers: SeriousBroker.it  This test is not yet approved or cleared by the [place]  FDA and has been authorized for detection and/or diagnosis of SARS-CoV-2 by FDA under an Emergency Use Authorization (EUA). This EUA will remain in effect (meaning this test can be used) for the duration of the COVID-19 declaration under Section 564(b)(1) of the Act, 21 U.S.C. section 360bbb-3(b)(1), unless the authorization is terminated or revoked.     Resp Syncytial Virus by PCR NEGATIVE NEGATIVE Final    Comment: (NOTE) Fact Sheet for Patients: BloggerCourse.com  Fact Sheet for Healthcare Providers: SeriousBroker.it  This test is not yet approved or cleared by the [place]  FDA and has been authorized for detection and/or diagnosis of SARS-CoV-2 by FDA under an Emergency Use Authorization (EUA). This EUA will remain in effect (meaning this test can be used) for the duration of the COVID-19 declaration under Section 564(b)(1) of the Act, 21 U.S.C. section 360bbb-3(b)(1), unless the authorization is terminated or revoked.  Performed at [facility], [address] [place], [place] [address]   Respiratory (~20 pathogens) panel by PCR     Status: None   Collection Time: 10/05/23  8:11 AM   Specimen: Nasopharyngeal Swab;  Respiratory  Result Value Ref Range Status   Adenovirus NOT DETECTED NOT DETECTED Final   Coronavirus 229E NOT DETECTED NOT DETECTED Final    Comment: (NOTE) The Coronavirus on the Respiratory Panel, DOES NOT test for the novel  Coronavirus (2019 nCoV)    Coronavirus HKU1 NOT DETECTED NOT DETECTED Final   Coronavirus NL63 NOT DETECTED NOT DETECTED Final   Coronavirus OC43 NOT DETECTED NOT DETECTED Final   Metapneumovirus NOT DETECTED NOT DETECTED Final   Rhinovirus / Enterovirus NOT DETECTED NOT DETECTED Final   Influenza A NOT DETECTED NOT DETECTED Final   Influenza B NOT DETECTED NOT DETECTED Final   Parainfluenza Virus 1 NOT DETECTED NOT DETECTED Final   Parainfluenza Virus 2 NOT DETECTED NOT DETECTED Final   Parainfluenza Virus 3 NOT DETECTED NOT DETECTED Final   Parainfluenza Virus 4 NOT DETECTED NOT DETECTED Final   Respiratory Syncytial Virus NOT DETECTED NOT DETECTED Final   Bordetella pertussis NOT DETECTED NOT DETECTED Final   Bordetella Parapertussis NOT DETECTED NOT DETECTED Final   Chlamydophila pneumoniae NOT DETECTED NOT DETECTED Final   Mycoplasma pneumoniae NOT DETECTED NOT DETECTED Final    Comment:  Performed at [facility], [address], [place], [place] [address]  Expectorated Sputum Assessment w Gram Stain, Rflx to Resp Cult     Status: None   Collection Time: 10/05/23  9:07 AM   Specimen: Sputum  Result Value Ref Range Status   Specimen Description SPUTUM  Final   Special Requests NONE  Final   Sputum evaluation   Final    Sputum specimen not acceptable for testing.  Please recollect.   C/KERRY NELSON AT 1005 10/05/23.PMF Performed at [facility], [address] [place], [place] [address]    Report Status 10/05/2023 FINAL  Final  Expectorated Sputum Assessment w Gram Stain, Rflx to Resp Cult     Status: None   Collection Time: 10/05/23 10:50 AM  Result Value Ref Range Status   Specimen Description EXPECTORATED SPUTUM  Final   Special  Requests NONE  Final   Sputum evaluation   Final    THIS SPECIMEN IS ACCEPTABLE FOR SPUTUM CULTURE Performed at [facility], [address] [place], [place] [address]    Report Status 10/05/2023 FINAL  Final  Culture, Respiratory w Gram Stain     Status: None   Collection Time: 10/05/23 10:50 AM  Result Value Ref Range Status   Specimen Description   Final    EXPECTORATED SPUTUM Performed at [facility], [address], [place], [place] [address]    Special Requests   Final    NONE Reflexed from [id number] Performed at [facility], [address] [place], [place] [address]    Gram Stain   Final    RARE WBC SEEN RARE Hillis Lu POSITIVE RODS RARE Hillis Lu POSITIVE COCCI RARE GRAM NEGATIVE RODS    Culture   Final    FEW Normal respiratory flora-no Staph aureus or Pseudomonas seen Performed at [facility], [address] [place], [place] [address]    Report Status 10/07/2023 FINAL  Final  MRSA Next Gen by PCR, Nasal     Status: None   Collection Time: 10/06/23 12:57 AM   Specimen: Nasal Mucosa; Nasal Swab  Result Value Ref Range Status   MRSA by PCR Next Gen NOT DETECTED NOT DETECTED Final    Comment: (NOTE) The GeneXpert MRSA Assay (FDA approved for NASAL specimens only), is one component of a comprehensive MRSA colonization surveillance program. It is not intended to diagnose MRSA infection nor to guide or monitor treatment for MRSA infections. Test performance is not FDA approved in patients less than 62 years old. Performed at [facility], [address] [place], [place] [address]   Culture, Respiratory w Gram Stain     Status: None (Preliminary result)   Collection Time: 10/06/23 11:37 AM   Specimen: INDUCED SPUTUM  Result Value Ref Range Status   Specimen Description   Final    INDUCED SPUTUM Performed at [facility], [address], [place], [place] [address]    Special Requests   Final    NONE Performed at  [facility], [address] [place], [place] [address]    Gram Stain   Final    FEW WBC PRESENT,BOTH PMN AND MONONUCLEAR FEW GRAM POSITIVE RODS    Culture   Final    CULTURE REINCUBATED FOR BETTER GROWTH Performed at [facility], [address], [place], [place] [address]    Report Status PENDING  Incomplete   Studies/Results: DG Chest Port 1 View Result Date: 10/09/2023 CLINICAL DATA:  Vomiting. EXAM: PORTABLE CHEST 1 VIEW COMPARISON:  Oct 05, 2023 FINDINGS: Stable cardiomegaly. Endotracheal tube is in grossly good position. Increased bilateral lung opacities are noted concerning for worsening edema or pneumonia. Small right pleural effusion is noted. Bony thorax is unremarkable. IMPRESSION: Endotracheal tube in grossly good position. Increased bilateral lung opacities as noted above. Electronically Signed   By: Rosalene Colon M.D.   On: 10/09/2023 08:28   DG Abd 1 View Result Date: 10/09/2023 CLINICAL DATA:  Vomiting. EXAM: ABDOMEN - 1 VIEW COMPARISON:  Oct 07, 2023. FINDINGS: The bowel gas pattern is normal. Gastrostomy tube is noted in epigastric region. IMPRESSION: No abnormal bowel dilatation. Electronically Signed   By: Rosalene Colon M.D.   On: 10/09/2023 08:27   DG Abd 1 View Result Date: 10/07/2023 CLINICAL DATA:  Nasogastric placement EXAM: ABDOMEN - 1 VIEW COMPARISON:  Earlier today FINDINGS: Tube extending the level of the upper thorax back up into neck and down to the level of the mid to upper chest in the midline. Based on the course and position of the tube, this could be in the left mainstem bronchus or esophagus. The endotracheal tube tip is 4.6 cm above the carina. Enlarged heart. Ill-defined opacity at both lung bases suggesting a combination of pleural fluid and atelectasis or pneumonia. Stable diffuse prominence of the interstitial markings. Thoracic spine degenerative changes. Mild-to-moderate right glenohumeral joint degenerative changes. The included  bowel-gas pattern is normal. IMPRESSION: 1. Malpositioned feeding tube looped in the upper chest with its tip in the left mainstem bronchus or esophagus. Recommend removal and replacement. 2. Stable cardiomegaly, bilateral pleural effusions, bibasilar atelectasis or pneumonia and diffuse interstitial lung disease/edema. These results will be called to the ordering clinician or representative by the Radiologist Assistant, and communication documented in the PACS or [employer]. Electronically Signed   By: Catherin Closs M.D.   On: 10/07/2023 17:10   DG Abd 1 View Result Date: 10/07/2023 CLINICAL DATA:  Orogastric tube placement EXAM: ABDOMEN - 1 VIEW COMPARISON:  None Available. FINDINGS: Tip of the weighted enteric tube is in the mid lower chest, likely in the esophagus. Repositioning/advancement is recommended. Generalized paucity of upper abdominal bowel gas. IMPRESSION: Tip of the weighted enteric tube is in the mid lower chest, likely in the esophagus. Repositioning/advancement is recommended. Electronically Signed   By: Chadwick Colonel M.D.   On: 10/07/2023 14:50   Medications: I have reviewed the patient's current medications. Scheduled Meds:  Chlorhexidine  Gluconate Cloth  6 each Topical Daily   docusate  100 mg Per Tube BID   enoxaparin  (LOVENOX ) injection  0.5 mg/kg Subcutaneous Q24H   feeding supplement (PROSource TF20)  60 mL Per Tube Daily   fentaNYL (SUBLIMAZE) injection  25 mcg Intravenous Once   free water  30 mL Per Tube Q4H   insulin aspart  0-9 Units Subcutaneous Q4H   methylPREDNISolone (SOLU-MEDROL) injection  40 mg Intravenous Q24H   mouth rinse  15 mL Mouth Rinse Q2H   pantoprazole  (PROTONIX ) IV  40 mg Intravenous Q24H   polyethylene glycol  17 g Per Tube Daily   Continuous Infusions:  cefTRIAXone (ROCEPHIN)  IV Stopped (10/08/23 1816)   famotidine (PEPCID) IV Stopped (10/09/23 0052)   feeding supplement (VITAL HIGH PROTEIN) Stopped (10/08/23 1953)   fentaNYL infusion  INTRAVENOUS 175 mcg/hr (10/09/23 0858)   norepinephrine (LEVOPHED) Adult infusion Stopped (10/06/23 0739)   propofol  (DIPRIVAN ) infusion 15 mcg/kg/min (10/09/23 0858)   PRN Meds:.acetaminophen , atropine, fentaNYL, hydrALAZINE , ipratropium-albuterol , midazolam,  ondansetron  **OR** ondansetron  (ZOFRAN ) IV, mouth rinse   Assessment: Principal Problem:   Multifocal pneumonia Active Problems:   GERD (gastroesophageal reflux disease)   Dysphagia   Essential hypertension, benign   Acute respiratory failure with hypoxia (HCC)   Sepsis (HCC)    Plan: The patient is status post PEG tube placement yesterday with a large amount of food in his esophagus which appears to be what came up into his mouth today.  There was no food in the stomach.  This should not impede his PEG tube feedings since he has not bringing up the PEG tube feedings but the solid food in his esophagus.  The patient will need some definitive treatment for his esophageal motility disorder that is being followed at [facility]. Nothing further to do from a GI point of view at this time.  I will sign off.  Please call if any further GI concerns or questions.  We would like to thank you for the opportunity to participate in the care of RUSTON RAPOZO.    LOS: 4 days   Marnee Sink, MD.FACG 10/09/2023, 9:22 AM Pager [phone] 7am-5pm  Check AMION for 5pm -7am coverage and on weekends

## 2023-10-09 NOTE — Progress Notes (Signed)
 Pt extubated to HFNC @ 6L, no complications with extubation.Sats on HFNC 94%

## 2023-10-09 NOTE — Progress Notes (Signed)
   10/09/23 1000  Spiritual Encounters  Type of Visit Follow up  Care provided to: Pt and family (Son and girlfriend at bedside)  Referral source Family  Reason for visit Routine spiritual support  OnCall Visit Yes  Spiritual Framework  Presenting Themes Impactful experiences and emotions;Courage hope and growth;Other (comment) (for Family)  Interventions  Spiritual Care Interventions Made Compassionate presence;Reflective listening;Narrative/life review;Meaning making;Encouragement;Other (comment) (For Family: Pt's family is encouraged my Pt's progress today)  Intervention Outcomes  Outcomes Connection to spiritual care;Awareness of support;Other (comment) (for Family)

## 2023-10-10 DIAGNOSIS — A419 Sepsis, unspecified organism: Secondary | ICD-10-CM | POA: Diagnosis not present

## 2023-10-10 DIAGNOSIS — J9602 Acute respiratory failure with hypercapnia: Secondary | ICD-10-CM | POA: Diagnosis not present

## 2023-10-10 DIAGNOSIS — R6521 Severe sepsis with septic shock: Secondary | ICD-10-CM | POA: Diagnosis not present

## 2023-10-10 DIAGNOSIS — Z515 Encounter for palliative care: Secondary | ICD-10-CM | POA: Diagnosis not present

## 2023-10-10 DIAGNOSIS — I10 Essential (primary) hypertension: Secondary | ICD-10-CM | POA: Diagnosis not present

## 2023-10-10 DIAGNOSIS — Z87898 Personal history of other specified conditions: Secondary | ICD-10-CM | POA: Diagnosis not present

## 2023-10-10 DIAGNOSIS — K219 Gastro-esophageal reflux disease without esophagitis: Secondary | ICD-10-CM | POA: Diagnosis not present

## 2023-10-10 DIAGNOSIS — G9341 Metabolic encephalopathy: Secondary | ICD-10-CM | POA: Diagnosis not present

## 2023-10-10 DIAGNOSIS — J690 Pneumonitis due to inhalation of food and vomit: Secondary | ICD-10-CM | POA: Diagnosis not present

## 2023-10-10 DIAGNOSIS — J9601 Acute respiratory failure with hypoxia: Secondary | ICD-10-CM | POA: Diagnosis not present

## 2023-10-10 DIAGNOSIS — K220 Achalasia of cardia: Secondary | ICD-10-CM | POA: Diagnosis not present

## 2023-10-10 DIAGNOSIS — T17908A Unspecified foreign body in respiratory tract, part unspecified causing other injury, initial encounter: Secondary | ICD-10-CM | POA: Diagnosis not present

## 2023-10-10 DIAGNOSIS — J189 Pneumonia, unspecified organism: Secondary | ICD-10-CM | POA: Diagnosis not present

## 2023-10-10 LAB — CBC WITH DIFFERENTIAL/PLATELET
Abs Immature Granulocytes: 2.28 10*3/uL — ABNORMAL HIGH (ref 0.00–0.07)
Basophils Absolute: 0 10*3/uL (ref 0.0–0.1)
Basophils Relative: 0 %
Eosinophils Absolute: 0.1 10*3/uL (ref 0.0–0.5)
Eosinophils Relative: 1 %
HCT: 40.4 % (ref 39.0–52.0)
Hemoglobin: 13.6 g/dL (ref 13.0–17.0)
Immature Granulocytes: 11 %
Lymphocytes Relative: 7 %
Lymphs Abs: 1.4 10*3/uL (ref 0.7–4.0)
MCH: 32.3 pg (ref 26.0–34.0)
MCHC: 33.7 g/dL (ref 30.0–36.0)
MCV: 96 fL (ref 80.0–100.0)
Monocytes Absolute: 2.3 10*3/uL — ABNORMAL HIGH (ref 0.1–1.0)
Monocytes Relative: 12 %
Neutro Abs: 13.7 10*3/uL — ABNORMAL HIGH (ref 1.7–7.7)
Neutrophils Relative %: 69 %
Platelets: 327 10*3/uL (ref 150–400)
RBC: 4.21 MIL/uL — ABNORMAL LOW (ref 4.22–5.81)
RDW: 13.2 % (ref 11.5–15.5)
Smear Review: NORMAL
WBC: 19.9 10*3/uL — ABNORMAL HIGH (ref 4.0–10.5)
nRBC: 0.1 % (ref 0.0–0.2)

## 2023-10-10 LAB — GLUCOSE, CAPILLARY
Glucose-Capillary: 103 mg/dL — ABNORMAL HIGH (ref 70–99)
Glucose-Capillary: 111 mg/dL — ABNORMAL HIGH (ref 70–99)
Glucose-Capillary: 137 mg/dL — ABNORMAL HIGH (ref 70–99)
Glucose-Capillary: 143 mg/dL — ABNORMAL HIGH (ref 70–99)
Glucose-Capillary: 166 mg/dL — ABNORMAL HIGH (ref 70–99)
Glucose-Capillary: 92 mg/dL (ref 70–99)

## 2023-10-10 LAB — RENAL FUNCTION PANEL
Albumin: 3.1 g/dL — ABNORMAL LOW (ref 3.5–5.0)
Anion gap: 13 (ref 5–15)
BUN: 25 mg/dL — ABNORMAL HIGH (ref 8–23)
CO2: 25 mmol/L (ref 22–32)
Calcium: 8.4 mg/dL — ABNORMAL LOW (ref 8.9–10.3)
Chloride: 105 mmol/L (ref 98–111)
Creatinine, Ser: 0.86 mg/dL (ref 0.61–1.24)
GFR, Estimated: >60 mL/min (ref >60)
Glucose, Bld: 105 mg/dL — ABNORMAL HIGH (ref 70–99)
Phosphorus: 3.2 mg/dL (ref 2.5–4.6)
Potassium: 3.5 mmol/L (ref 3.5–5.1)
Sodium: 143 mmol/L (ref 135–145)

## 2023-10-10 LAB — HEMOGLOBIN A1C
Hgb A1c MFr Bld: 5.5 % (ref 4.8–5.6)
Mean Plasma Glucose: 111.15 mg/dL

## 2023-10-10 LAB — CULTURE, BLOOD (ROUTINE X 2)
Culture: NO GROWTH
Culture: NO GROWTH

## 2023-10-10 LAB — MAGNESIUM: Magnesium: 2.2 mg/dL (ref 1.7–2.4)

## 2023-10-10 MED ORDER — QUETIAPINE FUMARATE 25 MG PO TABS
50.0000 mg | ORAL_TABLET | Freq: Every day | ORAL | Status: DC
  Administered 2023-10-10 – 2023-10-19 (×10): 50 mg
  Filled 2023-10-10 (×10): qty 2

## 2023-10-10 MED ORDER — DEXMEDETOMIDINE HCL IN NACL 400 MCG/100ML IV SOLN
0.0000 ug/kg/h | INTRAVENOUS | Status: DC
  Administered 2023-10-10: 1.2 ug/kg/h via INTRAVENOUS
  Administered 2023-10-10: 0.4 ug/kg/h via INTRAVENOUS
  Filled 2023-10-10 (×3): qty 100

## 2023-10-10 MED ORDER — DIAZEPAM 5 MG/ML IJ SOLN
2.5000 mg | INTRAMUSCULAR | Status: DC
  Administered 2023-10-10: 2.5 mg via INTRAVENOUS
  Filled 2023-10-10: qty 2

## 2023-10-10 MED ORDER — AMLODIPINE BESYLATE 5 MG PO TABS
2.5000 mg | ORAL_TABLET | Freq: Every day | ORAL | Status: DC
  Administered 2023-10-12 – 2023-10-21 (×9): 2.5 mg
  Filled 2023-10-10 (×9): qty 1

## 2023-10-10 MED ORDER — HALOPERIDOL LACTATE 5 MG/ML IJ SOLN
2.5000 mg | Freq: Once | INTRAMUSCULAR | Status: AC
  Administered 2023-10-10: 2.5 mg via INTRAVENOUS

## 2023-10-10 MED ORDER — DIAZEPAM 5 MG/ML IJ SOLN
2.5000 mg | INTRAMUSCULAR | Status: DC | PRN
  Administered 2023-10-10 – 2023-10-12 (×4): 2.5 mg via INTRAVENOUS
  Filled 2023-10-10 (×5): qty 2

## 2023-10-10 MED ORDER — ORAL CARE MOUTH RINSE
15.0000 mL | OROMUCOSAL | Status: DC | PRN
  Administered 2023-10-11 – 2023-10-12 (×7): 15 mL via OROMUCOSAL

## 2023-10-10 MED ORDER — HALOPERIDOL LACTATE 5 MG/ML IJ SOLN
INTRAMUSCULAR | Status: AC
  Filled 2023-10-10: qty 1

## 2023-10-10 MED ORDER — OSMOLITE 1.5 CAL PO LIQD
1000.0000 mL | ORAL | Status: DC
  Administered 2023-10-10 – 2023-10-12 (×5): 1000 mL

## 2023-10-10 NOTE — Progress Notes (Addendum)
 Palliative Care Progress Note, Assessment & Plan   Patient Name: Robert Vega       Date: 10/10/2023 DOB: 1941-03-10  Age: 83 y.o. MRN#: [id number] Attending Physician: Cleve Dale, MD Primary Care Physician: Helaine Llanos, MD Admit Date: 10/05/2023  Subjective: Patient is out of bed and sitting in recliner.  He is awake, alert, and oriented to self.  He acknowledges my presence and is able to make his wishes known.  His son Robert Vega is at bedside during my visit.  HPI: 83 y.o. male  with past medical history of dysphagia (s/p EGD 03/2022 with food impaction in upper esophagus-suffered aspiration and cardiac arrest--concern for lack or peristalsis, OSA and HTN admitted from home on 10/05/2023 with hypoxia, fever and generalized weakness.   Admitted initially to [facility] service, unfortunately overnight suffered likely aspiration event of vomitus--transferred to ICU and required mechanical ventilation   5/11: bronch 5/13: PEG placed, patient failed wean trial 5/14: vomiting X 2 overnight, successfully extubated to RA 5:15: AM agitation, remains confused but pleasant   Palliative medicine was consulted for assisting with goals of care conversations.  Summary of counseling/coordination of care: Extensive chart review completed prior to meeting patient including labs, vital signs, imaging, progress notes, orders, and available advanced directive documents from current and previous encounters.   After reviewing the patient's chart and assessing the patient at bedside, I spoke with patient in regards to symptom management and goals of care.   During the course of my conversation, patient introduced me to his son twice.  He has short-term memory issues.  He continues to refer back to the same story of him  drinking Pepsi as a child.  Patient is unable to participate in symptom assessment or goals of care/medical decision making independently at this time.  I attempted to outline the patient that he is n.p.o. given his high risk for aspiration.  Plan is to utilize his feeding tube.  However, patient continues to refer to the story where he is just can have a little sip of Pepsi and it will cure his problems.  After visiting with the patient, I stepped outside patient's room and spoke with his son Robert Vega.  Robert Vega shares understanding that patient will likely need a higher level of care at discharge.  Encouraged Robert Vega to discuss finances send follow-up with TOC for discharge planning.  Plan remains to start trickle feeds via feeding tube to avoid significant aspiration event.  Patient should remain strictly n.p.o. at this time.  After visiting with patient and Robert Vega, I spoke with patient's granddaughter Robert Vega/HCPOA over the phone.  Robert Vega shares understanding that patient needs n.p.o. status and use of feeding tube for nutritional support.  She shares she has received several updates from the medical team.  Full code and full scope remain.  PMT will continue to follow and support patient and family throughout his hospitalization.  PMT will step back from daily visits, visit intermittently and reengage when appropriate.  Robert Vega has PMT contact info and was encouraged to reach out to PMT for any acute palliative needs.    Please reach out to PMT and utilize Amion for available PMT providers should palliative needs arise during this  hospitalization.    Physical Exam Vitals reviewed.  Constitutional:      General: He is not in acute distress. HENT:     Head: Normocephalic.     Mouth/Throat:     Mouth: Mucous membranes are moist.  Eyes:     Pupils: Pupils are equal, round, and reactive to light.  Pulmonary:     Effort: Pulmonary effort is normal.  Abdominal:     Palpations: Abdomen is soft.   Skin:    General: Skin is warm and dry.  Neurological:     Mental Status: He is alert.     Comments: Oriented to self             Total Time 50 minutes   Time spent includes: Detailed review of medical records (labs, imaging, vital signs), medically appropriate exam (mental status, respiratory, cardiac, skin), discussed with treatment team, counseling and educating patient, family and staff, documenting clinical information, medication management and coordination of care.  Judeen Nose L. Rebbeca Campi, DNP, FNP-BC Palliative Medicine Team

## 2023-10-10 NOTE — Progress Notes (Addendum)
 Sitter called this RN into room for patient vomiting. Patient had vomited a medium amount of thick, tan emesis onto gown. Suctioned patient's mouth and cleaned patient up. Oxygen saturation remains stable at 96% on 2 liters nasal cannula.Dr. Auston Left made aware and verbal order given to stop tube feeds.

## 2023-10-10 NOTE — Progress Notes (Signed)
 NAME:  Robert Vega, MRN:  [id number], DOB:  1941-01-22, LOS: 5 ADMISSION DATE:  10/05/2023  History of Present Illness:  83 y.o male with significant PMH of  OSA, GERD, Obesity, HTN, Dysphagia: EGD 03/2022 with food in upper esophagus complicated by aspiration event, cardiac arrest with round of CPR, and post resuscitation EGD with concern for lack of peristalsis. Prior EGD 08/2020 with note of abnormal cricopharyngeus, decrease in motility in esophagus, and spastic LES who presented to the ED from with hypoxia, fever and generalized weakness.   Per ED report, patient's significant other was driving him to urgent care when she had to pull over to call EMS due to worsening symptoms. On EMS arrival, patient was found in the urgent care parking lot lethargic and hypoxic with initial sats in the 80's on RA and fever of 100.7. He was placed on 6L en route to the ED with improvement.   ED Course: Initial vital signs showed HR of beats/minute, BP 110/35mm Hg, the RR 16-mid 20's breaths/minute, and the oxygen saturation 84% on and a temperature of 102.82F (38.9C).  Pertinent Labs/Diagnostics Findings: Na+/ K+: 137/3.5 Glucose:152 BUN/Cr.:21/1.00  WBC: 7.7 K/L without bands or neutrophil predominance  COVID PCR: Negative,  Lactic: 2.2 VBG: pO2 33; pCO2 50; pH 7.38;  HCO3 29.6, %O2 Sat 52.3.  CXR> CTA Chest> see results below Medication administered in the WU:[id number] given 30 cc/kg of fluids and started on broad-spectrum antibiotics Ceftriaxone, Azithromycin and Flagyl for sepsis  secondary to suspected Aspiration Pneumonia.  Disposition:Admitted to medsurg under [facility] service  Pertinent  Medical History  OSA, GERD, Obesity, HTN, Dysphagia: EGD 03/2022 with food in upper esophagus complicated by aspiration event, cardiac arrest with round of CPR, and post resuscitation EGD with concern for lack of peristalsis. Prior EGD 08/2020 with note of abnormal cricopharyngeus, decrease in motility in esophagus,  and spastic LES   Significant Hospital Events: Including procedures, antibiotic start and stop dates in addition to other pertinent events   5/10: Admit to [facility] service with sepsis due to Aspiration Pneumonia.  Course complicated by Acute Respiratory Failure due to Aspiration of vomitus requiring s/p cardiac arrest  transfer to ICU and intubation.  5/12 remains intubated 5/13 remains on vent, s/p PEG tube, +vomiting last night, failed SAT/SBT 5/14 extubated successfully but with severe delirium  Interim History / Subjective:  Remains critically ill Severe delirium Plan for valium therapy   Objective    Blood pressure (!) 152/97, pulse 92, temperature 97.8 F (36.6 C), temperature source Axillary, resp. rate 20, height 5' 9.02" (1.753 m), weight 106.6 kg, SpO2 97%.    Vent Mode: PRVC FiO2 (%):  [35 %] 35 % Set Rate:  [18 bmp-19 bmp] 18 bmp Vt Set:  [500 mL] 500 mL PEEP:  [8 cmH20] 8 cmH20 Plateau Pressure:  [19 cmH20] 19 cmH20   Intake/Output Summary (Last 24 hours) at 10/10/2023 0714 Last data filed at 10/10/2023 0345 Gross per 24 hour  Intake 372.62 ml  Output 1225 ml  Net -852.38 ml   Filed Weights   10/08/23 0500 10/09/23 7829 10/10/23 0600  Weight: 107.2 kg 102 kg 106.6 kg     REVIEW OF SYSTEMS  PATIENT IS UNABLE TO PROVIDE COMPLETE REVIEW OF SYSTEMS DUE TO SEVERE CRITICAL ILLNESS   PHYSICAL EXAMINATION:  GENERAL:critically ill appearing EYES: Pupils equal, round, reactive to light.  No scleral icterus.  MOUTH: Moist mucosal membrane NECK: Supple.  PULMONARY: Lungs clear to auscultation, +rhonchi, +wheezing CARDIOVASCULAR: S1 and S2.  Regular rate and rhythm GASTROINTESTINAL: Soft, nontender, -distended. Positive bowel sounds.  MUSCULOSKELETAL: No swelling, clubbing, or edema.  NEUROLOGIC: confused, disoriented SKIN:normal, warm to touch, Capillary refill delayed  Pulses present bilaterally    Assessment & Plan:  83 y.o male with significant PMH of  OSA,  GERD, Obesity, HTN, Dysphagia: EGD 03/2022 with food in upper esophagus complicated by aspiration event, cardiac arrest with round of CPR, and post resuscitation EGD with concern for lack of peristalsis. Prior EGD 08/2020 with note of abnormal cricopharyngeus, decrease in motility in esophagus, and spastic LES who presented to the ED from with hypoxia, fever and generalized weakness. Leading to acute hypoxic resp failure and failure to wean from ventilator  Severe ACUTE Hypoxic and Hypercapnic Respiratory Failure High risk for aspiration and high risk for re-intubation Oxygen as needed  INFECTIOUS DISEASE-ASPIRATION PNEUMONIA -continue antibiotics as prescribed -follow up cultures  SEPTIC shock-resolved SOURCE-pneumonia -use vasopressors to keep MAP>65 as needed  GI Achalasia (CT chest with large hiatal hernia with a markedly dilated esophagus) S/p PEG tube NPO indefinitely   NEUROLOGY ACUTE METABOLIC ENCEPHALOPATHY Severe delirium Try valium   RENAL -continue Foley Catheter-assess need -Avoid nephrotoxic agents -Follow urine output, BMP -Ensure adequate renal perfusion, optimize oxygenation -Renal dose medications   Intake/Output Summary (Last 24 hours) at 10/10/2023 0717 Last data filed at 10/10/2023 0345 Gross per 24 hour  Intake 372.62 ml  Output 1225 ml  Net -852.38 ml      ENDO - ICU hypoglycemic\Hyperglycemia protocol -check FSBS per protocol   GI GI PROPHYLAXIS as indicated NUTRITIONAL STATUS DIET-->NPO, TF's as tolerated Constipation protocol as indicated  ELECTROLYTES -follow labs as needed -replace as needed -pharmacy consultation and following  ACUTE ANEMIA- TRANSFUSE AS NEEDED CONSIDER TRANSFUSION  IF HGB<7     Best Practice (right click and "Reselect all SmartList Selections" daily)   Diet/type: NPO DVT prophylaxis LMWH Pressure ulcer(s): N/A GI prophylaxis: PPI Lines: N/A Foley:  N/A Code Status:  full code Last date of  multidisciplinary goals of care discussion [10/06/2023]     DVT/GI PRX  assessed I Assessed the need for Labs I Assessed the need for Foley I Assessed the need for Central Venous Line Family Discussion when available I Assessed the need for Mobilization I made an Assessment of medications to be adjusted accordingly Safety Risk assessment completed  CASE DISCUSSED IN MULTIDISCIPLINARY ROUNDS WITH ICU TEAM   PROGNOSIS IS POOR QUALITY OF LIFE IS COMPROMISED  Critical Care Time devoted to patient care services described in this note is 55 minutes.    Lady Pier, M.D.  [facility]  Medical Director [facility] Medical Director [facility] Cardio-Pulmonary Department

## 2023-10-10 NOTE — Progress Notes (Signed)
 Nutrition Follow-up  DOCUMENTATION CODES:   Obesity unspecified  INTERVENTION:   -TF via PEG:   Initiate Osmolite 1.5 @ 20 ml/hr (trickle rate only today per MD)   Once able, increase by 10 ml every 4 hours to goal rate of 50 ml/hr.   60 ml Prosource TF20 daily  30 ml free water flush every 4 hours  Tube feeding regimen provides 1880 kcal (100% of needs), 95 grams of protein, and 914 ml of H2O. Total free water: 1094 ml daily    NUTRITION DIAGNOSIS:   Inadequate oral intake related to inability to eat (pt sedated and ventilated) as evidenced by NPO status.  Ongoing  GOAL:   Patient will meet greater than or equal to 90% of their needs  Progressing   MONITOR:   Diet advancement, TF tolerance  REASON FOR ASSESSMENT:   Ventilator    ASSESSMENT:   83 y/o male with h/o GERD, esophageal dysphagia, HTN, BPH, mood disorder and hiatal hernia who is admitted with aspiration event, PNA, sepsis, cardiac arrest and dysphagia.  5/13- s/p PEG 5/14- extubated  Reviewed I/O's: -845 ml x 24 hours and +323 ml since admission  UOP: 1.2 L x 24 hours  Noted pt were held yesterday due to concern of undigested food chunks in esophagus.   Spoke with pt and son at bedside. Pt sitting up in recliner chair at time of visit. Pt conversant and eager to engage with RD. Pt happy to be off of the ventilator. 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   Case discussed with RN and MD. Plan for trickle feeds today.  Palliative care following for goals of care discussions.   Wt has been stable since last visit.   Medications reviewed and include colace, lovenox  and protonix .   Labs reviewed: CBGS: 92-131 (inpatient orders for glycemic control are 0-9 units insulin aspart every 4 hours).    Diet Order:   Diet Order             Diet NPO time specified  Diet effective now                   EDUCATION NEEDS:    No education needs have been identified at this time  Skin:  Skin Assessment: Skin Integrity Issues: Skin Integrity Issues:: Other (Comment) Other: skin tear to lt lower arm  Last BM:  10/04/23  Height:   Ht Readings from Last 1 Encounters:  10/08/23 5' 9.02" (1.753 m)    Weight:   Wt Readings from Last 1 Encounters:  10/10/23 106.6 kg    Ideal Body Weight:  72.7 kg  BMI:  Body mass index is 34.69 kg/m.  Estimated Nutritional Needs:   Kcal:  1800-2000  Protein:  90-105 grams  Fluid:  1.8-2.0 L    Herschel Lords, RD, LDN, CDCES Registered Dietitian III Certified Diabetes Care and Education Specialist If unable to reach this RD, please use "RD Inpatient" group chat on secure chat between hours of 8am-4 pm daily

## 2023-10-10 NOTE — Progress Notes (Signed)
 Patient still confused but calm and cooperative while family at bedside. When family leaves patient is harder to redirect and has increasing aggression. Sitter order placed.

## 2023-10-10 NOTE — Plan of Care (Signed)
 Shortly after Valium administration patient is calmer and holding conversation with this RN. He is alert and oriented X 3. Safety mittens removed as these seem to be causing more agitation. Discussed with patient safety precautions. Patient requesting to sit on side of bed to be able to urinate. Patient repositioned, bath and linen change provided, and patient transferred from bed to recliner with 2 assist. Patient calm and cooperative, intermittently confused but redirectable at this time.   Plan for transfer of care to [facility] in next 24 hrs Patient now SD status

## 2023-10-10 NOTE — Progress Notes (Signed)
 Into patient room for bedside report. Patient is confused,agitated and combative, swinging at staff and attempting to get out of bed and pull safety mittens off. Dr. Auston Left at bedside, orders received for Valium. Valium administered and patient repositioned safely into bed.   Shortly after Valium administration patient is calmer and holding conversation with this RN. He is alert and oriented X 3. Safety mittens removed as these seem to be causing more agitation. Discussed with patient safety precautions. Patient requesting to sit on side of bed to be able to urinate. Patient repositioned, bath and linen change provided, and patient transferred from bed to recliner with 2 assist. Patient calm and cooperative, intermittently confused but redirectable at this time.

## 2023-10-10 NOTE — Progress Notes (Signed)
 PHARMACY CONSULT NOTE - FOLLOW UP  Pharmacy Consult for Electrolyte Monitoring and Replacement   Recent Labs: Potassium (mmol/L)  Date Value  10/10/2023 3.5   Magnesium (mg/dL)  Date Value  40/98/1191 2.2   Calcium (mg/dL)  Date Value  47/82/9562 8.4 (L)   Albumin (g/dL)  Date Value  13/12/6576 3.1 (L)   Phosphorus (mg/dL)  Date Value  46/96/2952 3.2   Sodium (mmol/L)  Date Value  10/10/2023 143     Assessment: 83 y/o male with h/o GERD, esophageal dysphagia, HTN, BPH, mood disorder and hiatal hernia who is admitted with aspiration event, PNA, sepsis, cardiac arrest and dysphagia. Pharmacy is asked to follow and replace electrolytes while in CCU  Goal of Therapy:  Electrolytes WNL  Plan:  ---no electrolyte replacement warranted for today ---recheck electrolytes in am  Robert Vega ,PharmD Clinical Pharmacist 10/10/2023 7:42 AM

## 2023-10-10 NOTE — Plan of Care (Signed)
  Problem: Clinical Measurements: Goal: Respiratory complications will improve Outcome: Progressing   Problem: Activity: Goal: Risk for activity intolerance will decrease Outcome: Progressing   Problem: Nutrition: Goal: Adequate nutrition will be maintained Outcome: Progressing   Problem: Elimination: Goal: Will not experience complications related to urinary retention Outcome: Progressing   Problem: Respiratory: Goal: Ability to maintain adequate ventilation will improve Outcome: Progressing Goal: Ability to maintain a clear airway will improve Outcome: Progressing   Problem: Education: Goal: Knowledge of General Education information will improve Description: Including pain rating scale, medication(s)/side effects and non-pharmacologic comfort measures Outcome: Not Progressing   Problem: Coping: Goal: Level of anxiety will decrease Outcome: Not Progressing   Problem: Pain Managment: Goal: General experience of comfort will improve and/or be controlled Outcome: Not Progressing

## 2023-10-10 NOTE — Progress Notes (Signed)
 Patient with increasing agitation and aggression. PRN valium given. After valium administration patient's agitation increasing and is verbally and physically abusive towards staff, kicking and swinging arm and attempting to pull out IV, PEG tube, and external catheter Attempted to redirect and calm patient without success. Order received for one time dose of IV Haldol. Administered with no change in symptoms. Order received for precedex infusion.

## 2023-10-11 ENCOUNTER — Inpatient Hospital Stay

## 2023-10-11 DIAGNOSIS — J9601 Acute respiratory failure with hypoxia: Secondary | ICD-10-CM | POA: Diagnosis not present

## 2023-10-11 DIAGNOSIS — J9602 Acute respiratory failure with hypercapnia: Secondary | ICD-10-CM | POA: Diagnosis not present

## 2023-10-11 DIAGNOSIS — K220 Achalasia of cardia: Secondary | ICD-10-CM

## 2023-10-11 DIAGNOSIS — I6782 Cerebral ischemia: Secondary | ICD-10-CM | POA: Diagnosis not present

## 2023-10-11 DIAGNOSIS — J690 Pneumonitis due to inhalation of food and vomit: Secondary | ICD-10-CM | POA: Insufficient documentation

## 2023-10-11 DIAGNOSIS — Z87898 Personal history of other specified conditions: Secondary | ICD-10-CM | POA: Diagnosis not present

## 2023-10-11 DIAGNOSIS — R410 Disorientation, unspecified: Secondary | ICD-10-CM | POA: Diagnosis not present

## 2023-10-11 DIAGNOSIS — J189 Pneumonia, unspecified organism: Secondary | ICD-10-CM | POA: Diagnosis not present

## 2023-10-11 LAB — CBC WITH DIFFERENTIAL/PLATELET
Abs Immature Granulocytes: 0.98 10*3/uL — ABNORMAL HIGH (ref 0.00–0.07)
Basophils Absolute: 0 10*3/uL (ref 0.0–0.1)
Basophils Relative: 0 %
Eosinophils Absolute: 0 10*3/uL (ref 0.0–0.5)
Eosinophils Relative: 0 %
HCT: 36.1 % — ABNORMAL LOW (ref 39.0–52.0)
Hemoglobin: 12.4 g/dL — ABNORMAL LOW (ref 13.0–17.0)
Immature Granulocytes: 8 %
Lymphocytes Relative: 11 %
Lymphs Abs: 1.4 10*3/uL (ref 0.7–4.0)
MCH: 32.5 pg (ref 26.0–34.0)
MCHC: 34.3 g/dL (ref 30.0–36.0)
MCV: 94.5 fL (ref 80.0–100.0)
Monocytes Absolute: 1.2 10*3/uL — ABNORMAL HIGH (ref 0.1–1.0)
Monocytes Relative: 9 %
Neutro Abs: 9.2 10*3/uL — ABNORMAL HIGH (ref 1.7–7.7)
Neutrophils Relative %: 72 %
Platelets: 244 10*3/uL (ref 150–400)
RBC: 3.82 MIL/uL — ABNORMAL LOW (ref 4.22–5.81)
RDW: 12.9 % (ref 11.5–15.5)
Smear Review: NORMAL
WBC: 12.7 10*3/uL — ABNORMAL HIGH (ref 4.0–10.5)
nRBC: 0 % (ref 0.0–0.2)

## 2023-10-11 LAB — GLUCOSE, CAPILLARY
Glucose-Capillary: 108 mg/dL — ABNORMAL HIGH (ref 70–99)
Glucose-Capillary: 119 mg/dL — ABNORMAL HIGH (ref 70–99)
Glucose-Capillary: 121 mg/dL — ABNORMAL HIGH (ref 70–99)
Glucose-Capillary: 94 mg/dL (ref 70–99)
Glucose-Capillary: 97 mg/dL (ref 70–99)
Glucose-Capillary: 98 mg/dL (ref 70–99)

## 2023-10-11 LAB — RENAL FUNCTION PANEL
Albumin: 3.2 g/dL — ABNORMAL LOW (ref 3.5–5.0)
Anion gap: 9 (ref 5–15)
BUN: 24 mg/dL — ABNORMAL HIGH (ref 8–23)
CO2: 28 mmol/L (ref 22–32)
Calcium: 8.3 mg/dL — ABNORMAL LOW (ref 8.9–10.3)
Chloride: 103 mmol/L (ref 98–111)
Creatinine, Ser: 0.64 mg/dL (ref 0.61–1.24)
GFR, Estimated: >60 mL/min (ref >60)
Glucose, Bld: 101 mg/dL — ABNORMAL HIGH (ref 70–99)
Phosphorus: 2.5 mg/dL (ref 2.5–4.6)
Potassium: 3.6 mmol/L (ref 3.5–5.1)
Sodium: 140 mmol/L (ref 135–145)

## 2023-10-11 LAB — MAGNESIUM: Magnesium: 1.9 mg/dL (ref 1.7–2.4)

## 2023-10-11 MED ORDER — LACTULOSE 10 GM/15ML PO SOLN
20.0000 g | Freq: Two times a day (BID) | ORAL | Status: AC
  Administered 2023-10-11 (×2): 20 g
  Filled 2023-10-11 (×2): qty 30

## 2023-10-11 MED ORDER — METOCLOPRAMIDE HCL 5 MG/ML IJ SOLN
10.0000 mg | Freq: Four times a day (QID) | INTRAMUSCULAR | Status: DC
  Administered 2023-10-11 – 2023-10-20 (×32): 10 mg via INTRAVENOUS
  Filled 2023-10-11 (×33): qty 2

## 2023-10-11 MED ORDER — THIAMINE HCL 100 MG PO TABS
100.0000 mg | ORAL_TABLET | Freq: Every day | ORAL | Status: AC
  Administered 2023-10-12 – 2023-10-18 (×7): 100 mg
  Filled 2023-10-11 (×14): qty 1

## 2023-10-11 MED ORDER — HALOPERIDOL LACTATE 5 MG/ML IJ SOLN
2.0000 mg | INTRAMUSCULAR | Status: DC | PRN
Start: 2023-10-11 — End: 2023-10-21
  Administered 2023-10-11 – 2023-10-20 (×9): 2 mg via INTRAVENOUS
  Filled 2023-10-11 (×10): qty 1

## 2023-10-11 NOTE — Evaluation (Signed)
 Physical Therapy Evaluation Patient Details Name: Robert Vega MRN: [id number] DOB: [birth year] Today's Date: 10/11/2023  History of Present Illness  Robert Vega is a 83 y.o. male with medical history significant of hypertension, sleep apnea, obesity, GERD presenting with acute febrile hypoxia, sepsis  and pneumonia.  Patient noted to be overall poor historian.  Per report, patient with increased work of breathing generalized weakness over the past 12 to 24 hours.  Was initially evaluated urgent care however EMS had to be called.   Clinical Impression  Patient received in bed, resting quietly. Requires cues to open eyes, which he does briefly. Patient answers most questions asked, however unsure of accuracy of information as patient states he is in his garage. He demonstrates general weakness, especially in LEs. Patient was assisted in attempting to roll to side, however became agitated with this. At this time, patient will require total +2 assist for safety. He will benefit from trial of PT if he is able to cooperate.         If plan is discharge home, recommend the following: Two people to help with walking and/or transfers;Two people to help with bathing/dressing/bathroom   Can travel by private vehicle   No    Equipment Recommendations Other (comment) (TBD)  Recommendations for Other Services       Functional Status Assessment Patient has had a recent decline in their functional status and/or demonstrates limited ability to make significant improvements in function in a reasonable and predictable amount of time     Precautions / Restrictions Precautions Precautions: Fall Recall of Precautions/Restrictions: Impaired Restrictions Weight Bearing Restrictions Per Provider Order: No      Mobility  Bed Mobility Overal bed mobility: Needs Assistance Bed Mobility: Rolling Rolling: +2 for physical assistance, Total assist         General bed mobility comments: patient  agitated with attempts at mobility.    Transfers                   General transfer comment: did not attempt    Ambulation/Gait               General Gait Details: unable  Stairs            Wheelchair Mobility     Tilt Bed    Modified Rankin (Stroke Patients Only)       Balance                                             Pertinent Vitals/Pain Pain Assessment Pain Assessment: No/denies pain    Home Living Family/patient expects to be discharged to:: Private residence Living Arrangements: Alone   Type of Home: Mobile home Home Access: Stairs to enter       Home Layout: One level Home Equipment: Agricultural consultant (2 wheels) Additional Comments: patient is poor historian, no family present to verify information.    Prior Function Prior Level of Function : Independent/Modified Independent;Driving                     Extremity/Trunk Assessment   Upper Extremity Assessment Upper Extremity Assessment: Defer to OT evaluation    Lower Extremity Assessment Lower Extremity Assessment: Generalized weakness;Difficult to assess due to impaired cognition       Communication   Communication Communication: Impaired Factors Affecting Communication: Difficulty expressing self;Reduced  clarity of speech    Cognition Arousal: Lethargic Behavior During Therapy: Agitated   PT - Cognitive impairments: No family/caregiver present to determine baseline                         Following commands: Impaired Following commands impaired: Follows one step commands inconsistently     Cueing Cueing Techniques: Verbal cues, Tactile cues     General Comments      Exercises     Assessment/Plan    PT Assessment Patient needs continued PT services  PT Problem List Decreased activity tolerance;Decreased mobility;Decreased cognition       PT Treatment Interventions Functional mobility training;Therapeutic  activities;Therapeutic exercise;Balance training;Cognitive remediation;Patient/family education    PT Goals (Current goals can be found in the Care Plan section)  Acute Rehab PT Goals PT Goal Formulation: Patient unable to participate in goal setting Time For Goal Achievement: 10/25/23    Frequency Min 1X/week     Co-evaluation PT/OT/SLP Co-Evaluation/Treatment: Yes Reason for Co-Treatment: To address functional/ADL transfers;Necessary to address cognition/behavior during functional activity;For patient/therapist safety PT goals addressed during session: Mobility/safety with mobility         AM-PAC PT "6 Clicks" Mobility  Outcome Measure Help needed turning from your back to your side while in a flat bed without using bedrails?: Total Help needed moving from lying on your back to sitting on the side of a flat bed without using bedrails?: Total Help needed moving to and from a bed to a chair (including a wheelchair)?: Total Help needed standing up from a chair using your arms (e.g., wheelchair or bedside chair)?: Total Help needed to walk in hospital room?: Total Help needed climbing 3-5 steps with a railing? : Total 6 Click Score: 6    End of Session Equipment Utilized During Treatment: Oxygen Activity Tolerance: Patient limited by lethargy;Treatment limited secondary to agitation Patient left: in bed;with nursing/sitter in room;with bed alarm set Nurse Communication: Mobility status PT Visit Diagnosis: Other abnormalities of gait and mobility (R26.89)    Time: 1119-1130 PT Time Calculation (min) (ACUTE ONLY): 11 min   Charges:   PT Evaluation $PT Eval Low Complexity: 1 Low   PT General Charges $$ ACUTE PT VISIT: 1 Visit         Triniti Gruetzmacher, PT, GCS 10/11/23,11:44 AM

## 2023-10-11 NOTE — Hospital Course (Addendum)
 83 yo with h/o OSA, GERD, HTN, class 1 obesity, and dysphagia who presented on 5/10 with fever and hypoxia.  He was diagnosed with sepsis due to PNA with possible aspiration and effectively treated with antibiotics.  He had an aspiration event that led to respiratory arrest on 5/10 (no CPR provided during this hospitalization on extensive chart review) and was transferred to the ICU intubated.  (He also had prior EGD in 03/2022 with food in upper esophagus complicated by aspiration event, cardiac arrest with round of CPR, and post resuscitation EGD with concern for lack of peristalsis. Hx prior EGD 08/2020 with note of abnormal cricopharyngeus, decrease in motility in esophagus, and spastic LES.)  He was extubated on 5/14 but had severe delirium.  He underwent botox  injection into the LES on 5/20.  5/21 esophagram with diffusely distended esophagus with distal obstruction and severe dysmotility suggestive of achalasia. He underwent PEG tube placement on 5/13, pulled out the tube on 5/25 and eventually required ex lap placement.  After that situation, he developed progressive AKI (baseline creatinine 1.6), culminating in initiation of HD.  He has had waxing and waning mental status during this prolonged hospitalization; neurology has consulted and attributes this to metabolic encephalopathy related to his multitude of medical problems.  ID consulted; he has completed antibiotics and is not recommended for ongoing therapy unless new issue arises.  Urology consulted for consideration of B nephrostomy tubes but this was deferred.  He has a rectal tube with diarrhea that is thought to be related to tube feeds; dietician is still consulting.  He is now medically stable and awaiting placement in LTC SNF, as he does not appear to be physically capable of progressing appropriately in STR.

## 2023-10-11 NOTE — Progress Notes (Signed)
 This RN heard patient Coughing, upon getting to bedside patient was vomiting, thick, tan/orange ,chunky emesis.Patient was suctioned  O2 sats at 94% on 2 liters nasal cannula. Nothing running via PEG tube. Tube feeds been off since yesterday around 6:15 pm. Zofran  was given at this time. Upon cleaning patient there were food crumbs in bed. Jenni Mody Lee,NP aware.This RN will update POA granddaughter [name].   Kenyon Pears 10/11/2023

## 2023-10-11 NOTE — Plan of Care (Signed)
 Rounded at bedside throughout shift. Sitter present at bedside. Mr. Ridenhour remains significantly confused. Per chart review, not tolerating tube feeds, rate being adjusted and addition of Reglan. No family at bedside during times of rounding. PMT provider to follow through weekend and touch base with family as able.  No Charge.  Isadore Marble, DNP, AGNP-C Palliative Medicine  Please call Palliative Medicine team phone with any questions [phone]. For individual providers please see AMION.

## 2023-10-11 NOTE — Evaluation (Signed)
 Occupational Therapy Evaluation Patient Details Name: Robert Vega MRN: [id number] DOB: 1941/02/08 Today's Date: 10/11/2023   History of Present Illness   Robert Vega is a 83 y.o. male with medical history significant of hypertension, sleep apnea, obesity, GERD presenting with acute febrile hypoxia, sepsis  and pneumonia.  Patient noted to be overall poor historian.  Per report, patient with increased work of breathing generalized weakness over the past 12 to 24 hours.  Was initially evaluated urgent care however EMS had to be called.     Clinical Impressions Patient presenting with decreased Ind in self care,balance, functional mobility/transfers, endurance, and safety awareness. Patient is oriented to self only. Pt verbalized living at home alone and independently. No family present to confirm. Pt also reports we are in his garage and it [birth year] Pt with reduced clarity of speech and refusing to open eyes during session. During bed mobility, pt becomes agitated and throws hands towards therapist. Total A of 2 for mobility but no further attempts made for EOB or transfer secondary to agitation.  Sitter present at end of session.  Patient will benefit from acute OT to increase overall independence in the areas of ADLs, functional mobility, and safety awareness in order to safely discharge.     If plan is discharge home, recommend the following:   Two people to help with walking and/or transfers;Two people to help with bathing/dressing/bathroom;Assistance with cooking/housework;Assist for transportation;Help with stairs or ramp for entrance;Direct supervision/assist for financial management;Direct supervision/assist for medications management;Supervision due to cognitive status     Functional Status Assessment   Patient has had a recent decline in their functional status and demonstrates the ability to make significant improvements in function in a reasonable and  predictable amount of time.     Equipment Recommendations   Other (comment) (defer to next venue of care)      Precautions/Restrictions   Precautions Precautions: Fall Recall of Precautions/Restrictions: Impaired     Mobility Bed Mobility Overal bed mobility: Needs Assistance Bed Mobility: Rolling Rolling: +2 for physical assistance, Total assist         General bed mobility comments: patient agitated with attempts at mobility.    Transfers                   General transfer comment: did not attempt          ADL either performed or assessed with clinical judgement   ADL Overall ADL's : Needs assistance/impaired                                       General ADL Comments: total A     Vision Baseline Vision/History: 1 Wears glasses Patient Visual Report: No change from baseline              Pertinent Vitals/Pain Pain Assessment Pain Assessment: No/denies pain     Extremity/Trunk Assessment Upper Extremity Assessment Upper Extremity Assessment: Generalized weakness;Difficult to assess due to impaired cognition   Lower Extremity Assessment Lower Extremity Assessment: Generalized weakness;Difficult to assess due to impaired cognition       Communication Communication Communication: Impaired Factors Affecting Communication: Difficulty expressing self;Reduced clarity of speech   Cognition Arousal: Lethargic Behavior During Therapy: Agitated Cognition: Cognition impaired   Orientation impairments: Place, Time, Situation Awareness: Intellectual awareness impaired, Online awareness impaired   Attention impairment (select first level of impairment): Focused  attention Executive functioning impairment (select all impairments): Initiation, Organization, Sequencing, Reasoning, Problem solving OT - Cognition Comments: Pt believes we are in his garage and that it is his birthday                 Following commands:  Impaired Following commands impaired: Follows one step commands inconsistently     Cueing  General Comments   Cueing Techniques: Verbal cues;Tactile cues              Home Living Family/patient expects to be discharged to:: Private residence Living Arrangements: Alone   Type of Home: Mobile home Home Access: Stairs to enter     Home Layout: One level     Bathroom Shower/Tub: Tub/shower unit;Walk-in shower         Home Equipment: Agricultural consultant (2 wheels)   Additional Comments: patient is poor historian, no family present to verify information.      Prior Functioning/Environment Prior Level of Function : Independent/Modified Independent;Driving                    OT Problem List: Decreased strength;Decreased activity tolerance;Decreased safety awareness;Impaired balance (sitting and/or standing);Decreased knowledge of use of DME or AE   OT Treatment/Interventions: Self-care/ADL training;Therapeutic exercise;Therapeutic activities;Energy conservation;DME and/or AE instruction;Patient/family education;Balance training      OT Goals(Current goals can be found in the care plan section)   Acute Rehab OT Goals Patient Stated Goal: unable to verbalize OT Goal Formulation: Patient unable to participate in goal setting Time For Goal Achievement: 10/25/23 Potential to Achieve Goals: Fair ADL Goals Pt Will Perform Grooming: with supervision;sitting Pt Will Perform Lower Body Dressing: with min assist;sit to/from stand Pt Will Transfer to Toilet: with min assist;ambulating Pt Will Perform Toileting - Clothing Manipulation and hygiene: with min assist;sit to/from stand   OT Frequency:  Min 2X/week    Co-evaluation PT/OT/SLP Co-Evaluation/Treatment: Yes Reason for Co-Treatment: To address functional/ADL transfers;Necessary to address cognition/behavior during functional activity;For patient/therapist safety PT goals addressed during session: Mobility/safety  with mobility OT goals addressed during session: ADL's and self-care      AM-PAC OT "6 Clicks" Daily Activity     Outcome Measure Help from another person eating meals?: Total Help from another person taking care of personal grooming?: Total Help from another person toileting, which includes using toliet, bedpan, or urinal?: Total Help from another person bathing (including washing, rinsing, drying)?: Total Help from another person to put on and taking off regular upper body clothing?: Total Help from another person to put on and taking off regular lower body clothing?: Total 6 Click Score: 6   End of Session Nurse Communication: Mobility status  Activity Tolerance: Treatment limited secondary to agitation Patient left: in bed;with call bell/phone within reach;with bed alarm set;with nursing/sitter in room  OT Visit Diagnosis: Unsteadiness on feet (R26.81);Repeated falls (R29.6);Muscle weakness (generalized) (M62.81)                Time: 6045-4098 OT Time Calculation (min): 12 min Charges:  OT General Charges $OT Visit: 1 Visit OT Evaluation $OT Eval Moderate Complexity: 1 [address] [place], OTR/L , CBIS ascom [phone]  10/11/23, 12:40 PM

## 2023-10-11 NOTE — Progress Notes (Signed)
 PHARMACY CONSULT NOTE - FOLLOW UP  Pharmacy Consult for Electrolyte Monitoring and Replacement   Recent Labs: Potassium (mmol/L)  Date Value  10/11/2023 3.6   Magnesium (mg/dL)  Date Value  16/02/9603 1.9   Calcium (mg/dL)  Date Value  54/01/8118 8.3 (L)   Albumin (g/dL)  Date Value  14/78/2956 3.2 (L)   Phosphorus (mg/dL)  Date Value  21/30/8657 2.5   Sodium (mmol/L)  Date Value  10/11/2023 140     Assessment: 83 y/o male with h/o GERD, esophageal dysphagia, HTN, BPH, mood disorder and hiatal hernia who is admitted with aspiration event, PNA, sepsis, cardiac arrest and dysphagia. Pharmacy is asked to follow and replace electrolytes while in CCU  Goal of Therapy:  Electrolytes WNL  Plan:  ---no electrolyte replacement warranted for today ---recheck electrolytes in am  Robert Vega ,PharmD Clinical Pharmacist 10/11/2023 7:13 AM

## 2023-10-11 NOTE — Progress Notes (Signed)
 Nutrition Follow Up Note   DOCUMENTATION CODES:   Obesity unspecified  INTERVENTION:   Recommend TPN initiation if patient is unable to tolerate tube feeds  Osmolite 1.5 @60ml /hr- Initiate at 71ml/hr, once tolerating, increase by 10ml/hr q 8 hours until goal rate is reached.   ProSource TF 20- Give 60ml daily via tube, each supplement provides 80kcal and 20g of protein.   Free water flushes 100ml q4 hours   Regimen provides 2240kcal/day, 110g/day protein and 1697ml/day of free water.   Pt at high refeed risk; recommend monitor potassium, magnesium and phosphorus labs daily until stable  Thiamine 100mg  daily via tube x 7 days   Daily weights   NUTRITION DIAGNOSIS:   Inadequate oral intake related to inability to eat (pt sedated and ventilated) as evidenced by NPO status. -ongoing   GOAL:   Patient will meet greater than or equal to 90% of their needs -not met   MONITOR:   Diet advancement, Labs, Weight trends, TF tolerance, I & O's, Skin  ASSESSMENT:   83 y/o male with h/o GERD, esophageal dysphagia, HTN, BPH, mood disorder and hiatal hernia who is admitted with aspiration event, PNA, sepsis, cardiac arrest and dysphagia.  -Pt s/p EGD 5/13; found to have food in the entire esophagus and gastric polyps. PEG tube placed   Visited pt's room today; pt having confusion and agitation. Pt initiated on trickle tube feeds again yesterday but tube feeds were stopped yesterday evening as pt continued to regurgitate up food particles from his esophagus. Pt has now been without adequate nutrition for > 7 days. No BM since 5/9. Plan today is for tickle feeds today along with reglan, tapwater enema and lactulose. Pt remains at high refeed risk. Recommend TPN initiation if tube feeds are stopped again; this was discussed with MD. Per chart, pt appears to be at his UBW.   Medications reviewed and include: colace, lovenox , insulin, lactulose, reglan, protonix   Labs reviewed: K 3.6 wnl,  BUN 24(H), P 2.5 wnl, Mg 1.9 wnl Wbc- 12.7(H) Cbgs- 97, 98, 119 x 24 hrs   UOP-   Diet Order:   Diet Order             Diet NPO time specified  Diet effective now                  EDUCATION NEEDS:   No education needs have been identified at this time  Skin:  Skin Assessment: Skin Integrity Issues: Skin Integrity Issues:: Other (Comment) Other: skin tear to lt lower arm  Last BM:  10/04/23  Height:   Ht Readings from Last 1 Encounters:  10/08/23 5' 9.02" (1.753 m)    Weight:   Wt Readings from Last 1 Encounters:  10/10/23 106.6 kg    Ideal Body Weight:  72.7 kg  BMI:  Body mass index is 34.69 kg/m.  Estimated Nutritional Needs:   Kcal:  2000-2300kcal/day  Protein:  100-115g/day  Fluid:  1.8-2.1L/day  Torrance Freestone MS, RD, LDN If unable to be reached, please send secure chat to "RD inpatient" available from 8:00a-4:00p daily

## 2023-10-11 NOTE — Progress Notes (Signed)
 Progress Note   Patient: Robert Vega NWG:[id number] DOB: 1941-02-22 DOA: 10/05/2023     6 DOS: the patient was seen and examined on 10/11/2023   Brief hospital course: 83 y.o male with significant PMH of OSA, GERD, Obesity, HTN, Dysphagia: EGD 03/2022 with food in upper esophagus complicated by aspiration event, cardiac arrest with round of CPR, and post resuscitation EGD with concern for lack of peristalsis. Prior EGD 08/2020 with note of abnormal cricopharyngeus, decrease in motility in esophagus, and spastic LES who presented to the ED from with hypoxia, fever and generalized weakness.  Patient developed acute respiratory failure requiring intubation due to aspiration pneumonia, he was treated with broad-spectrum antibiotics and managed in ICU. Condition had improved, extubated 5/15, but has significant agitation required brief course of Precedex. 5/10: Admit to [facility] service with sepsis due to Aspiration Pneumonia.  Course complicated by Acute Respiratory Failure due to Aspiration of vomitus requiring s/p cardiac arrest  transfer to ICU and intubation.  5/12 remains intubated 5/13 remains on vent, s/p PEG tube, +vomiting last night, failed SAT/SBT 5/14 extubated successfully but with severe delirium   Principal Problem:   Multifocal pneumonia Active Problems:   Dysphagia   Acute respiratory failure with hypoxia (HCC)   GERD (gastroesophageal reflux disease)   Essential hypertension, benign   Sepsis (HCC)   History of dysphagia   Assessment and Plan: Acute respiratory failure with hypoxemia and hypercapnia secondary to aspiration pneumonia. Bilateral lower lobe aspiration pneumonia. Septic shock secondary to aspiration pneumonia. Initially required intubation, per documentation, patient also required a pressor. Condition seem to be improved, due to severe dysphagia, patient is unable to take any p.o., a G-tube was placed by GI, but so far has not been able to tolerate the tube  feeding. Antibiotics are completed.  Severe dysphagia with severe esophageal dysmotility and achalasia. Abdominal distention, unable to tolerate the tube feeding. Per record, patient has not been able to tolerate tube feeding for quite a few days, abdominal seem to be distended, but no significant tenderness.  KUB performed on 5/14 did not show any acute changes, bowel sounds are decreased.  Per record, last bowel movement was 5/9. Patient tube feeding has been on hold for the last few days, discussed with nutrition, will start tube feeding at 20 mL/h, start Reglan IV every 6 hours.  Give tapwater enema, also give lactulose 20 g twice a day x 2.  Delirium. Acute metabolic cephalopathy. Discussed with granddaughter, patient does not have baseline dementia.  He was requiring Precedex last night, but seemed to be better after slept last night.  Was given Seroquel. I will obtain a CT scan of the head to make sure is no acute changes.  Class I obesity with BMI 34.69. Obstruct sleep apnea. Continue to monitor.      Subjective:  Patient is very confused, without agitation today.  He has not been able to tolerate tube feeding.  He had intermittent nausea and vomiting.  Physical Exam: Vitals:   10/11/23 0745 10/11/23 0800 10/11/23 0900 10/11/23 1000  BP:  120/68 109/61 97/85  Pulse: (!) 47 (!) 49 (!) 49 (!) 47  Resp: 14 14 14 13   Temp:    98.7 F (37.1 C)  TempSrc:    Axillary  SpO2: 95% 95% 95% 96%  Weight:      Height:       General exam: Appears calm and comfortable  Respiratory system: Clear to auscultation. Respiratory effort normal. Cardiovascular system: S1 & S2 heard,  RRR. No JVD, murmurs, rubs, gallops or clicks. No pedal edema. Gastrointestinal system: Abdomen is distended, soft and nontender. No organomegaly or masses felt.  Bowel sounds are decreased. Central nervous system: Drowsy and disorientated.  Neurologic examination nonfocal. Extremities: Symmetric 5 x 5  power. Skin: No rashes, lesions or ulcers Psychiatry: Flat affect.   Data Reviewed:  Reviewed x-rays, lab results.  Family Communication: Granddaughter updated over the phone.  Disposition: Status is: Inpatient Remains inpatient appropriate because: Severity of disease, altered mental status.     Time spent: 60 minutes  Author: Donaciano Frizzle, MD 10/11/2023 10:18 AM  For on call review [email or web address].

## 2023-10-12 ENCOUNTER — Inpatient Hospital Stay

## 2023-10-12 DIAGNOSIS — J9602 Acute respiratory failure with hypercapnia: Secondary | ICD-10-CM

## 2023-10-12 DIAGNOSIS — Z87898 Personal history of other specified conditions: Secondary | ICD-10-CM | POA: Diagnosis not present

## 2023-10-12 DIAGNOSIS — K220 Achalasia of cardia: Secondary | ICD-10-CM

## 2023-10-12 DIAGNOSIS — J690 Pneumonitis due to inhalation of food and vomit: Secondary | ICD-10-CM | POA: Diagnosis not present

## 2023-10-12 DIAGNOSIS — J189 Pneumonia, unspecified organism: Secondary | ICD-10-CM | POA: Diagnosis not present

## 2023-10-12 DIAGNOSIS — I517 Cardiomegaly: Secondary | ICD-10-CM | POA: Diagnosis not present

## 2023-10-12 DIAGNOSIS — J90 Pleural effusion, not elsewhere classified: Secondary | ICD-10-CM | POA: Diagnosis not present

## 2023-10-12 DIAGNOSIS — J9601 Acute respiratory failure with hypoxia: Secondary | ICD-10-CM | POA: Diagnosis not present

## 2023-10-12 DIAGNOSIS — J811 Chronic pulmonary edema: Secondary | ICD-10-CM | POA: Diagnosis not present

## 2023-10-12 LAB — RENAL FUNCTION PANEL
Albumin: 2.9 g/dL — ABNORMAL LOW (ref 3.5–5.0)
Anion gap: 10 (ref 5–15)
BUN: 30 mg/dL — ABNORMAL HIGH (ref 8–23)
CO2: 29 mmol/L (ref 22–32)
Calcium: 8.1 mg/dL — ABNORMAL LOW (ref 8.9–10.3)
Chloride: 103 mmol/L (ref 98–111)
Creatinine, Ser: 0.94 mg/dL (ref 0.61–1.24)
GFR, Estimated: >60 mL/min (ref >60)
Glucose, Bld: 124 mg/dL — ABNORMAL HIGH (ref 70–99)
Phosphorus: 3.2 mg/dL (ref 2.5–4.6)
Potassium: 3.1 mmol/L — ABNORMAL LOW (ref 3.5–5.1)
Sodium: 142 mmol/L (ref 135–145)

## 2023-10-12 LAB — GLUCOSE, CAPILLARY
Glucose-Capillary: 106 mg/dL — ABNORMAL HIGH (ref 70–99)
Glucose-Capillary: 117 mg/dL — ABNORMAL HIGH (ref 70–99)
Glucose-Capillary: 123 mg/dL — ABNORMAL HIGH (ref 70–99)
Glucose-Capillary: 133 mg/dL — ABNORMAL HIGH (ref 70–99)
Glucose-Capillary: 144 mg/dL — ABNORMAL HIGH (ref 70–99)
Glucose-Capillary: 146 mg/dL — ABNORMAL HIGH (ref 70–99)

## 2023-10-12 LAB — BRAIN NATRIURETIC PEPTIDE: B Natriuretic Peptide: 103.5 pg/mL — ABNORMAL HIGH (ref 0.0–100.0)

## 2023-10-12 LAB — MAGNESIUM: Magnesium: 2 mg/dL (ref 1.7–2.4)

## 2023-10-12 MED ORDER — OSMOLITE 1.5 CAL PO LIQD
1000.0000 mL | ORAL | Status: DC
  Administered 2023-10-12 – 2023-10-13 (×2): 1000 mL
  Administered 2023-10-13: 50 mL/h
  Administered 2023-10-14 – 2023-10-21 (×7): 1000 mL

## 2023-10-12 MED ORDER — BISACODYL 10 MG RE SUPP
10.0000 mg | Freq: Every day | RECTAL | Status: DC | PRN
  Administered 2023-10-12 – 2023-11-26 (×4): 10 mg via RECTAL
  Filled 2023-10-12 (×6): qty 1

## 2023-10-12 MED ORDER — POTASSIUM CHLORIDE 10 MEQ/100ML IV SOLN
10.0000 meq | INTRAVENOUS | Status: AC
Start: 2023-10-12 — End: 2023-10-12
  Administered 2023-10-12 (×2): 10 meq via INTRAVENOUS
  Filled 2023-10-12 (×2): qty 100

## 2023-10-12 MED ORDER — POLYETHYLENE GLYCOL 3350 17 G PO PACK: 17.0000 g | PACK | Freq: Every day | ORAL | Status: DC | PRN

## 2023-10-12 MED ORDER — SODIUM CHLORIDE 0.9 % IV SOLN: INTRAVENOUS | Status: AC | PRN

## 2023-10-12 MED ORDER — OSMOLITE 1.5 CAL PO LIQD: 1000.0000 mL | ORAL | Status: DC

## 2023-10-12 MED ORDER — PROSOURCE TF20 ENFIT COMPATIBL EN LIQD
30.0000 mL | Freq: Every day | ENTERAL | Status: DC
  Administered 2023-10-13 – 2023-10-21 (×7): 30 mL
  Filled 2023-10-12 (×9): qty 60

## 2023-10-12 MED ORDER — OXYCODONE-ACETAMINOPHEN 5-325 MG PO TABS
1.0000 | ORAL_TABLET | Freq: Four times a day (QID) | ORAL | Status: DC | PRN
  Administered 2023-10-25 – 2023-10-27 (×3): 1
  Filled 2023-10-12 (×3): qty 1

## 2023-10-12 MED ORDER — MELATONIN 5 MG PO TABS
5.0000 mg | ORAL_TABLET | Freq: Every day | ORAL | Status: DC
  Administered 2023-10-12 – 2023-10-19 (×8): 5 mg
  Filled 2023-10-12 (×8): qty 1

## 2023-10-12 MED ORDER — HALOPERIDOL LACTATE 5 MG/ML IJ SOLN: 2.5000 mg | Freq: Once | INTRAMUSCULAR | Status: DC

## 2023-10-12 MED ORDER — IPRATROPIUM-ALBUTEROL 0.5-2.5 (3) MG/3ML IN SOLN
3.0000 mL | RESPIRATORY_TRACT | Status: DC
  Administered 2023-10-12 (×2): 3 mL via RESPIRATORY_TRACT
  Filled 2023-10-12: qty 3

## 2023-10-12 MED ORDER — LACTULOSE 10 GM/15ML PO SOLN
20.0000 g | Freq: Two times a day (BID) | ORAL | Status: AC
  Administered 2023-10-12 (×2): 20 g
  Filled 2023-10-12 (×2): qty 30

## 2023-10-12 MED ORDER — IPRATROPIUM-ALBUTEROL 0.5-2.5 (3) MG/3ML IN SOLN
3.0000 mL | Freq: Four times a day (QID) | RESPIRATORY_TRACT | Status: DC
  Administered 2023-10-13 – 2023-10-15 (×6): 3 mL via RESPIRATORY_TRACT
  Filled 2023-10-12 (×7): qty 3

## 2023-10-12 MED ORDER — POTASSIUM CHLORIDE 20 MEQ PO PACK
40.0000 meq | PACK | Freq: Once | ORAL | Status: AC
  Administered 2023-10-12: 40 meq
  Filled 2023-10-12: qty 2

## 2023-10-12 MED ORDER — ENOXAPARIN SODIUM 60 MG/0.6ML IJ SOSY
50.0000 mg | PREFILLED_SYRINGE | INTRAMUSCULAR | Status: DC
  Administered 2023-10-12 – 2023-10-20 (×9): 50 mg via SUBCUTANEOUS
  Filled 2023-10-12 (×10): qty 0.6

## 2023-10-12 NOTE — Progress Notes (Addendum)
 Progress Note   Patient: Robert Vega ZOX:[id number] DOB: [birth year] DOA: 10/05/2023     7 DOS: the patient was seen and examined on 10/12/2023   Brief hospital course: 83 y.o male with significant PMH of OSA, GERD, Obesity, HTN, Dysphagia: EGD 03/2022 with food in upper esophagus complicated by aspiration event, cardiac arrest with round of CPR, and post resuscitation EGD with concern for lack of peristalsis. Prior EGD 08/2020 with note of abnormal cricopharyngeus, decrease in motility in esophagus, and spastic LES who presented to the ED from with hypoxia, fever and generalized weakness.  Patient developed acute respiratory failure requiring intubation due to aspiration pneumonia, he was treated with broad-spectrum antibiotics and managed in ICU. Condition had improved, extubated 5/15, but has significant agitation required brief course of Precedex . 5/10: Admit to [facility] service with sepsis due to Aspiration Pneumonia.  Course complicated by Acute Respiratory Failure due to Aspiration of vomitus requiring s/p cardiac arrest  transfer to ICU and intubation.  5/12 remains intubated 5/13 remains on vent, s/p PEG tube, +vomiting last night, failed SAT/SBT 5/14 extubated successfully but with severe delirium   Principal Problem:   Multifocal pneumonia Active Problems:   Dysphagia   Acute respiratory failure with hypoxia and hypercapnia (HCC)   GERD (gastroesophageal reflux disease)   Essential hypertension, benign   Sepsis (HCC)   History of dysphagia   Aspiration pneumonia (HCC)   Achalasia of esophagus   Assessment and Plan: Acute respiratory failure with hypoxemia and hypercapnia secondary to aspiration pneumonia. Bilateral lower lobe aspiration pneumonia. Septic shock secondary to aspiration pneumonia. Initially required intubation, per documentation, patient also required a pressor. Condition seem to be improved, due to severe dysphagia, patient would not be able to take p.o.,  working on tube feeding. Antibiotics are completed. Patient has increased oxygen requirement with worsening hypoxemia today, recheck chest x-ray, likely due to aspiration.  But patient no longer has any nausea vomiting.  Will continue to monitor in the ICU stepdown unit.   Severe dysphagia with severe esophageal dysmotility and achalasia. Abdominal distention, unable to tolerate the tube feeding. Per record, patient has not been able to tolerate tube feeding for quite a few days, abdominal seem to be distended, but no significant tenderness.  KUB performed on 5/14 did not show any acute changes, bowel sounds are decreased.  Per record, last bowel movement was 5/9. Patient was given Reglan  IV, restarted tube feeding on 5/16 at a lower dose, tolerating well now, will increase the rate.  Patient no longer has any abdominal distention. Patient was also giving lactulose , but refused tapwater enema.  Still has no bowel movement. Discussed with the patient, he will allow enema, repeat lactulose , also give MiraLAX , Dulcolax suppository.  Hypokalemia. Give 20 mEq of KCl IV for potassium 3.1.  Recheck level tomorrow.   Delirium. Acute metabolic encephalopathy. Discussed with granddaughter, patient does not have baseline dementia.  He was requiring Precedex  temporarily, but seemed to be better after given Seroquel . CT head did not show any acute changes. Currently, confusion seems to be better, still has intermittent agitation.  Continue Seroquel  at night, continue as needed Haldol , avoid benzodiazepine.   Class I obesity with BMI 34.69. Obstruct sleep apnea. Continue to monitor.      Subjective:  Patient still confused, with occasional agitation requiring a sitter.  Tolerating tube feeding without nausea or vomiting.  Still has no bowel movement.  Physical Exam: Vitals:   10/12/23 0715 10/12/23 0800 10/12/23 0859 10/12/23 0900  BP:  123/67 132/65   Pulse: 64 66 (!) 57 64  Resp: 15 16 15 15    Temp:   98.9 F (37.2 C)   TempSrc:   Axillary   SpO2: 91% 94% 94% 95%  Weight:      Height:       General exam: Appears calm and comfortable  Respiratory system: Clear to auscultation. Respiratory effort normal. Cardiovascular system: S1 & S2 heard, RRR. No JVD, murmurs, rubs, gallops or clicks. No pedal edema. Gastrointestinal system: Abdomen is nondistended, soft and nontender. No organomegaly or masses felt. Normal bowel sounds heard. Central nervous system: Alert and oriented x2. No focal neurological deficits. Extremities: Symmetric 5 x 5 power. Skin: No rashes, lesions or ulcers Psychiatry: Flat affect   Data Reviewed:  CT scan lab results reviewed.  Family Communication: None  Disposition: Status is: Inpatient Remains inpatient appropriate because: Severity of disease, altered mental status.     Time spent: 50 minutes  Author: Donaciano Frizzle, MD 10/12/2023 10:57 AM  For on call review [email or web address].

## 2023-10-12 NOTE — Plan of Care (Signed)
  Problem: Education: Goal: Knowledge of General Education information will improve Description: Including pain rating scale, medication(s)/side effects and non-pharmacologic comfort measures Outcome: Not Progressing   Problem: Clinical Measurements: Goal: Ability to maintain clinical measurements within normal limits will improve Outcome: Progressing Goal: Will remain free from infection Outcome: Progressing Goal: Diagnostic test results will improve Outcome: Progressing Goal: Respiratory complications will improve Outcome: Progressing Goal: Cardiovascular complication will be avoided Outcome: Progressing   Problem: Nutrition: Goal: Adequate nutrition will be maintained Outcome: Progressing   Problem: Elimination: Goal: Will not experience complications related to bowel motility Outcome: Not Progressing Goal: Will not experience complications related to urinary retention Outcome: Progressing

## 2023-10-12 NOTE — Progress Notes (Signed)
 Pt oriented to person and situation most of today, but intermittently confused and attempts to get OOB; states is going home. Did strike this RN this am upon awakening but no violent actions since that time. However, this evening pt has become increasingly confused and combative, attempting to get OOB, cursing, and attempting to strike staff. Haldol  was given as ordered. Safety sitter at bedside. Report given to oncoming shift. Safety sitter order renewed

## 2023-10-13 DIAGNOSIS — K220 Achalasia of cardia: Secondary | ICD-10-CM | POA: Diagnosis not present

## 2023-10-13 DIAGNOSIS — J690 Pneumonitis due to inhalation of food and vomit: Secondary | ICD-10-CM

## 2023-10-13 DIAGNOSIS — J9601 Acute respiratory failure with hypoxia: Secondary | ICD-10-CM | POA: Diagnosis not present

## 2023-10-13 DIAGNOSIS — J189 Pneumonia, unspecified organism: Secondary | ICD-10-CM | POA: Diagnosis not present

## 2023-10-13 DIAGNOSIS — J9602 Acute respiratory failure with hypercapnia: Secondary | ICD-10-CM | POA: Diagnosis not present

## 2023-10-13 DIAGNOSIS — Z87898 Personal history of other specified conditions: Secondary | ICD-10-CM | POA: Diagnosis not present

## 2023-10-13 LAB — RENAL FUNCTION PANEL
Albumin: 2.7 g/dL — ABNORMAL LOW (ref 3.5–5.0)
Anion gap: 7 (ref 5–15)
BUN: 22 mg/dL (ref 8–23)
CO2: 32 mmol/L (ref 22–32)
Calcium: 7.9 mg/dL — ABNORMAL LOW (ref 8.9–10.3)
Chloride: 104 mmol/L (ref 98–111)
Creatinine, Ser: 0.84 mg/dL (ref 0.61–1.24)
GFR, Estimated: >60 mL/min (ref >60)
Glucose, Bld: 139 mg/dL — ABNORMAL HIGH (ref 70–99)
Phosphorus: 3.6 mg/dL (ref 2.5–4.6)
Potassium: 3.4 mmol/L — ABNORMAL LOW (ref 3.5–5.1)
Sodium: 143 mmol/L (ref 135–145)

## 2023-10-13 LAB — GLUCOSE, CAPILLARY
Glucose-Capillary: 111 mg/dL — ABNORMAL HIGH (ref 70–99)
Glucose-Capillary: 122 mg/dL — ABNORMAL HIGH (ref 70–99)
Glucose-Capillary: 124 mg/dL — ABNORMAL HIGH (ref 70–99)
Glucose-Capillary: 125 mg/dL — ABNORMAL HIGH (ref 70–99)
Glucose-Capillary: 136 mg/dL — ABNORMAL HIGH (ref 70–99)
Glucose-Capillary: 140 mg/dL — ABNORMAL HIGH (ref 70–99)

## 2023-10-13 LAB — MAGNESIUM: Magnesium: 2.1 mg/dL (ref 1.7–2.4)

## 2023-10-13 MED ORDER — POTASSIUM CHLORIDE 20 MEQ PO PACK
40.0000 meq | PACK | Freq: Once | ORAL | Status: AC
  Administered 2023-10-13: 40 meq
  Filled 2023-10-13: qty 2

## 2023-10-13 NOTE — Plan of Care (Signed)

## 2023-10-13 NOTE — Progress Notes (Signed)
 The family wish to speak to me about his GI issues.  I spoke to the granddaughter in the ICU.  She works as a PT at the hospital.  She was concerned that despite the PEG tube the patient may inadvertently eat orally and may return back with the same issue and was wondering if there were any opportunities to treat his underlying condition which may be related to achalasia.   I explained to her that the main issue in the past has been that he had a cardiac arrest during the procedure i.e. endoscopy due to aspiration and hence further evaluation at tertiary centers were canceled.  I did however explain that we may have a few options   Options to consider 1.  Have his case reviewed by anesthesia if they could intubate him he could perform an upper endoscopy and inject Botox at the GE junction which could help if not completely resolve his swallowing issues.  This may reduce the risk of accumulation of food in the esophagus, we could also at a later date perform a swallowing study to determine where things are and if doing reasonably okay then refer him to a tertiary center for a dilation for achalasia versus myotomy following evaluation with esophageal manometry.  2.  If anesthesia feels that he is not yet ready to undergo intubation and an endoscopy then we could reconsider it in a few weeks as an outpatient.  I did say that the patient will be seen by Dr. Ole Berkeley tomorrow and he can have anesthesia help determine which option would be best.   If outpatient endoscopy with intubation and Botox is recommended I would be happy to see him at [facility] clinic gastroenterology after June 1   Dr Luke Salaam MD,MRCP [facility]) Gastroenterology/Hepatology Pager: [phone]

## 2023-10-13 NOTE — Progress Notes (Signed)
 Progress Note   Patient: Robert Vega WUJ:[id number] DOB: 04-Sep-1940 DOA: 10/05/2023     8 DOS: the patient was seen and examined on 10/13/2023   Brief hospital course: 83 y.o male with significant PMH of OSA, GERD, Obesity, HTN, Dysphagia: EGD 03/2022 with food in upper esophagus complicated by aspiration event, cardiac arrest with round of CPR, and post resuscitation EGD with concern for lack of peristalsis. Prior EGD 08/2020 with note of abnormal cricopharyngeus, decrease in motility in esophagus, and spastic LES who presented to the ED from with hypoxia, fever and generalized weakness.  Patient developed acute respiratory failure requiring intubation due to aspiration pneumonia, he was treated with broad-spectrum antibiotics and managed in ICU. Condition had improved, extubated 5/15, but has significant agitation required brief course of Precedex . 5/10: Admit to [facility] service with sepsis due to Aspiration Pneumonia.  Course complicated by Acute Respiratory Failure due to Aspiration of vomitus requiring s/p cardiac arrest  transfer to ICU and intubation.  5/12 remains intubated 5/13 remains on vent, s/p PEG tube, +vomiting last night, failed SAT/SBT 5/14 extubated successfully but with severe delirium   Principal Problem:   Multifocal pneumonia Active Problems:   Dysphagia   Acute respiratory failure with hypoxia and hypercapnia (HCC)   GERD (gastroesophageal reflux disease)   Essential hypertension, benign   Sepsis (HCC)   History of dysphagia   Aspiration pneumonia (HCC)   Achalasia of esophagus   Assessment and Plan:  Acute respiratory failure with hypoxemia and hypercapnia secondary to aspiration pneumonia. Bilateral lower lobe aspiration pneumonia. Septic shock secondary to aspiration pneumonia. Initially required intubation, per documentation, patient also required a pressor. Condition seem to be improved, due to severe dysphagia, patient would not be able to take p.o.,  working on tube feeding. Antibiotics are completed. Had increased oxygen requirements yesterday, repeat chest x-ray did not have worsening congestion or pneumonia.  BNP not elevated. Patient was lying down in bed when I was there, he was aspirating.  Has order for elevate head of bed at 45 degree, I have discussed with RN, need to enforce the elevation of bed. Oxygen seem to be better today.  No additional antibiotics needed. Transfer patient to medical floor.   Severe dysphagia with severe esophageal dysmotility and achalasia. Abdominal distention, unable to tolerate the tube feeding. Per record, patient has not been able to tolerate tube feeding for quite a few days, abdominal seem to be distended, but no significant tenderness.  KUB performed on 5/14 did not show any acute changes, bowel sounds are decreased.  Per record, last bowel movement was 5/9. Patient was given Reglan  IV, restarted tube feeding on 5/16 at a lower dose, tolerating well now, will increase the rate.  Patient no longer has any abdominal distention. Patient had a large bowel movement after several doses of stool softeners.  Abdominal distention better.  Rated tube feeding has increased to 50 mL with a goal of 60.  Hypokalemia. Continue replete potassium.  Magnesium normal.   Delirium. Acute metabolic encephalopathy. Discussed with granddaughter, patient does not have baseline dementia.  He was requiring Precedex  temporarily, but seemed to be better after given Seroquel . CT head did not show any acute changes. Condition seems to be improving today.   Class I obesity with BMI 34.69. Obstruct sleep apnea. Continue to monitor.       Subjective:  Patient slept well last night, no additional agitation.  Physical Exam: Vitals:   10/13/23 0600 10/13/23 0800 10/13/23 0900 10/13/23 1000  BP:  119/68 (!) 111/59 120/63 134/71  Pulse: 65 68 72 85  Resp: 17 19 19 16   Temp:  98.7 F (37.1 C)    TempSrc:  Oral    SpO2:  95% 94% 94% 98%  Weight:      Height:       General exam: Appears calm and comfortable  Respiratory system: Clear to auscultation. Respiratory effort normal. Cardiovascular system: S1 & S2 heard, RRR. No JVD, murmurs, rubs, gallops or clicks. No pedal edema. Gastrointestinal system: Abdomen is nondistended, soft and nontender. No organomegaly or masses felt. Normal bowel sounds heard. Central nervous system: Alert and oriented x2. No focal neurological deficits. Extremities: Symmetric 5 x 5 power. Skin: No rashes, lesions or ulcers Psychiatry: Mood & affect appropriate.    Data Reviewed:  Lab results reviewed.  Family Communication: son updated over the phone  Disposition: Status is: Inpatient Remains inpatient appropriate because: Severity of disease,     Time spent: 50 minutes  Author: Donaciano Frizzle, MD 10/13/2023 10:42 AM  For on call review [email or web address].

## 2023-10-13 NOTE — Plan of Care (Signed)
  Problem: Clinical Measurements: Goal: Ability to maintain clinical measurements within normal limits will improve Outcome: Progressing Goal: Will remain free from infection Outcome: Progressing Goal: Diagnostic test results will improve Outcome: Progressing Goal: Respiratory complications will improve Outcome: Progressing Goal: Cardiovascular complication will be avoided Outcome: Progressing   Problem: Activity: Goal: Risk for activity intolerance will decrease Outcome: Progressing   Problem: Nutrition: Goal: Adequate nutrition will be maintained Outcome: Progressing   Problem: Coping: Goal: Level of anxiety will decrease Outcome: Progressing   Problem: Elimination: Goal: Will not experience complications related to bowel motility Outcome: Progressing Goal: Will not experience complications related to urinary retention Outcome: Progressing   Problem: Pain Managment: Goal: General experience of comfort will improve and/or be controlled Outcome: Progressing   Problem: Safety: Goal: Ability to remain free from injury will improve Outcome: Progressing   Problem: Skin Integrity: Goal: Risk for impaired skin integrity will decrease Outcome: Progressing   Problem: Activity: Goal: Ability to tolerate increased activity will improve Outcome: Progressing   Problem: Clinical Measurements: Goal: Ability to maintain a body temperature in the normal range will improve Outcome: Progressing   Problem: Respiratory: Goal: Ability to maintain adequate ventilation will improve Outcome: Progressing   Problem: Education: Goal: Knowledge of General Education information will improve Description: Including pain rating scale, medication(s)/side effects and non-pharmacologic comfort measures Outcome: Not Progressing   Problem: Health Behavior/Discharge Planning: Goal: Ability to manage health-related needs will improve Outcome: Not Progressing

## 2023-10-13 NOTE — TOC Progression Note (Signed)
 Transition of Care [facility]) - Progression Note    Patient Details  Name: Robert Vega MRN: [id number] Date of Birth: 1940/11/12  [facility]) CM/SW Contact  Elidia Bonenfant A Endora Teresi, RN Phone Number: 10/13/2023, 3:12 PM  Clinical Narrative:    Chart reviewed.  I have spoken to patient's POA [name].  I have spoken to her about discharge planning.  I have informed her that PT has recommended SNF on discharge.  [name] informs me that she would like for patient to be evaluated for inpatient rehab.  I have made PT/OT team aware.  I have also spoken to [name] about  SNF.  [name] would like to try CIR first and then consider rehab if CIR is declined.  [name] will let me know what facilities she would like for me to fax out too.    TOC will continue to follow for discharge planning.           Expected Discharge Plan and Services                                               Social Determinants of Health (SDOH) Interventions SDOH Screenings   Food Insecurity: Patient Unable To Answer (10/06/2023)  Recent Concern: Food Insecurity - Food Insecurity Present (09/11/2023)   Received from [facility]  Housing: Patient Unable To Answer (10/06/2023)  Recent Concern: Housing - High Risk (09/11/2023)   Received from [facility]  Transportation Needs: Patient Unable To Answer (10/06/2023)  Utilities: Patient Unable To Answer (10/06/2023)  Depression (PHQ2-9): Low Risk  (05/07/2023)  Financial Resource Strain: Medium Risk (09/11/2023)   Received from [facility]  Social Connections: Unknown (10/06/2023)  Tobacco Use: Medium Risk (10/05/2023)    Readmission Risk Interventions     No data to display

## 2023-10-14 DIAGNOSIS — R509 Fever, unspecified: Secondary | ICD-10-CM

## 2023-10-14 DIAGNOSIS — J9601 Acute respiratory failure with hypoxia: Secondary | ICD-10-CM | POA: Diagnosis not present

## 2023-10-14 DIAGNOSIS — Z87898 Personal history of other specified conditions: Secondary | ICD-10-CM | POA: Diagnosis not present

## 2023-10-14 DIAGNOSIS — J189 Pneumonia, unspecified organism: Secondary | ICD-10-CM | POA: Diagnosis not present

## 2023-10-14 DIAGNOSIS — J690 Pneumonitis due to inhalation of food and vomit: Secondary | ICD-10-CM | POA: Diagnosis not present

## 2023-10-14 DIAGNOSIS — T17908A Unspecified foreign body in respiratory tract, part unspecified causing other injury, initial encounter: Secondary | ICD-10-CM | POA: Diagnosis not present

## 2023-10-14 DIAGNOSIS — J9602 Acute respiratory failure with hypercapnia: Secondary | ICD-10-CM | POA: Diagnosis not present

## 2023-10-14 DIAGNOSIS — K220 Achalasia of cardia: Secondary | ICD-10-CM | POA: Diagnosis not present

## 2023-10-14 LAB — CBC
HCT: 35 % — ABNORMAL LOW (ref 39.0–52.0)
Hemoglobin: 11.4 g/dL — ABNORMAL LOW (ref 13.0–17.0)
MCH: 32.5 pg (ref 26.0–34.0)
MCHC: 32.6 g/dL (ref 30.0–36.0)
MCV: 99.7 fL (ref 80.0–100.0)
Platelets: 210 10*3/uL (ref 150–400)
RBC: 3.51 MIL/uL — ABNORMAL LOW (ref 4.22–5.81)
RDW: 13.3 % (ref 11.5–15.5)
WBC: 10.7 10*3/uL — ABNORMAL HIGH (ref 4.0–10.5)
nRBC: 0 % (ref 0.0–0.2)

## 2023-10-14 LAB — RENAL FUNCTION PANEL
Albumin: 2.9 g/dL — ABNORMAL LOW (ref 3.5–5.0)
Anion gap: 5 (ref 5–15)
BUN: 23 mg/dL (ref 8–23)
CO2: 31 mmol/L (ref 22–32)
Calcium: 8.1 mg/dL — ABNORMAL LOW (ref 8.9–10.3)
Chloride: 105 mmol/L (ref 98–111)
Creatinine, Ser: 0.81 mg/dL (ref 0.61–1.24)
GFR, Estimated: >60 mL/min (ref >60)
Glucose, Bld: 121 mg/dL — ABNORMAL HIGH (ref 70–99)
Phosphorus: 2.5 mg/dL (ref 2.5–4.6)
Potassium: 4 mmol/L (ref 3.5–5.1)
Sodium: 141 mmol/L (ref 135–145)

## 2023-10-14 LAB — GLUCOSE, CAPILLARY
Glucose-Capillary: 100 mg/dL — ABNORMAL HIGH (ref 70–99)
Glucose-Capillary: 100 mg/dL — ABNORMAL HIGH (ref 70–99)
Glucose-Capillary: 105 mg/dL — ABNORMAL HIGH (ref 70–99)
Glucose-Capillary: 122 mg/dL — ABNORMAL HIGH (ref 70–99)
Glucose-Capillary: 126 mg/dL — ABNORMAL HIGH (ref 70–99)
Glucose-Capillary: 147 mg/dL — ABNORMAL HIGH (ref 70–99)

## 2023-10-14 LAB — MAGNESIUM: Magnesium: 2.2 mg/dL (ref 1.7–2.4)

## 2023-10-14 MED ORDER — LORAZEPAM 0.5 MG PO TABS: 0.5000 mg | ORAL_TABLET | ORAL | Status: DC | PRN

## 2023-10-14 MED ORDER — LORAZEPAM 2 MG/ML IJ SOLN
1.0000 mg | INTRAMUSCULAR | Status: DC | PRN
  Administered 2023-10-14 (×3): 1 mg via INTRAVENOUS
  Filled 2023-10-14 (×3): qty 1

## 2023-10-14 MED ORDER — QUETIAPINE FUMARATE 25 MG PO TABS
25.0000 mg | ORAL_TABLET | Freq: Every morning | ORAL | Status: DC
  Administered 2023-10-15 – 2023-10-18 (×4): 25 mg
  Filled 2023-10-14 (×4): qty 1

## 2023-10-14 NOTE — Care Management Important Message (Signed)
 Important Message  Patient Details  Name: Robert Vega MRN: [id number] Date of Birth: Jun 29, 1940   Important Message Given:  Yes - Medicare IM     Evangelia Whitaker W, CMA 10/14/2023, 11:59 AM

## 2023-10-14 NOTE — Progress Notes (Signed)
 Palliative Care Progress Note, Assessment & Plan   Patient Name: Robert Vega       Date: 10/14/2023 DOB: [birth year]  Age: 83 y.o. MRN#: [id number] Attending Physician: Donaciano Frizzle, MD Primary Care Physician: Helaine Llanos, MD Admit Date: 10/05/2023  Subjective: Patient is out of bed and sitting in recliner.  His granddaughter [name] and NTR at bedside during my visit.  Patient acknowledges my presence and is able to make his wishes known.  He is sleepy throughout our visit.  HPI: 82 y.o. male  with past medical history of dysphagia (s/p EGD 03/2022 with food impaction in upper esophagus-suffered aspiration and cardiac arrest--concern for lack or peristalsis, OSA and HTN admitted from home on 10/05/2023 with hypoxia, fever and generalized weakness.   Admitted initially to [facility] service, unfortunately overnight suffered likely aspiration event of vomitus--transferred to ICU and required mechanical ventilation   5/11: bronch 5/13: PEG placed, patient failed wean trial 5/14: vomiting X 2 overnight, successfully extubated to RA 5:15: AM agitation, remains confused but pleasant   Palliative medicine was consulted for assisting with goals of care conversations.  Summary of counseling/coordination of care: Extensive chart review completed prior to meeting patient including labs, vital signs, imaging, progress notes, orders, and available advanced directive documents from current and previous encounters.   After reviewing the patient's chart and assessing the patient at bedside, I spoke with patient in regards to symptom management and goals of care.   Patient denies pain or discomfort at this time.  He can recall that his granddaughter has been bedside for a few days.  However, when pressed to further  discuss his current medical situation, he is unable to engage independently in these discussions.  Discussed with granddaughter and NT at bedside that patient has had episodes of intermittent confusion and agitation.  Discussed that Haldol  and Ativan  have been used but that this is counter effective in allowing patient to be awake, alert, and able to safely participate in therapies in the morning.  After extensive review of patient's medications, I recommended to [name] and attending that patient have Seroquel  3 times a day with an increased dose in the evening to assist with sundowning.  Education on Seroquel  provided to [name].  Attending in agreement.  Discussed next steps and plan of care with his granddaughter/HCPOA [name].  Reviewed GI recommendations for Botox into the LES as well as option of POEMS.  Discussed that Botox procedure can be performed here but that patient would need transfer to a tertiary care center for POEMS.  [name] endorses she wants to take the steps since patient continues to asked to want to eat.  Quality versus quantity of life discussed.  Safely keeping patient n.p.o. while attempting to improve his esophageal dysmotility is the goal.  She shares concern that it patient immediately returns home after hospitalization that he would not abide by n.p.o. status.  Therefore, she is hopeful GI procedures can improve his esophageal dysmotility issues.  [name] is in agreement for Botox injection.  Awaiting scheduling by GI for this procedure.  Full code and full scope remain.  PMT remains available to patient and family throughout his hospitalization.  PMT will monitor patient peripherally  and engage where appropriate.  Physical Exam Vitals reviewed.  Constitutional:      General: He is not in acute distress. HENT:     Head: Normocephalic.     Nose: Nose normal.     Mouth/Throat:     Mouth: Mucous membranes are moist.  Eyes:     Pupils: Pupils are equal, round,  and reactive to light.  Pulmonary:     Effort: Pulmonary effort is normal.  Abdominal:     Palpations: Abdomen is soft.  Neurological:     Mental Status: He is alert.     Comments: Oriented to person, place  Psychiatric:        Mood and Affect: Mood normal.        Behavior: Behavior normal.        Thought Content: Thought content normal.        Judgment: Judgment normal.             Total Time 50 minutes   Time spent includes: Detailed review of medical records (labs, imaging, vital signs), medically appropriate exam (mental status, respiratory, cardiac, skin), discussed with treatment team, counseling and educating patient, family and staff, documenting clinical information, medication management and coordination of care.  Judeen Nose L. Rebbeca Campi, DNP, FNP-BC Palliative Medicine Team

## 2023-10-14 NOTE — Progress Notes (Signed)
 Pt given Haldol  2mg  IV at 2352 for agitation.  At 0045, Pt became combative and hitting the sitter.  Trying to get out of bed.  Required 4 staff members to get him back in the bed.  Dr. Achilles Holes made aware and will give Ativan  IV.  Sitter remains at bedside at this time. Sheela Denmark BSN RN CMSRN 10/14/2023, 1:14 AM

## 2023-10-14 NOTE — Progress Notes (Signed)
 Marnee Sink, MD [facility]   [address], [address] [place], [place] [address] Phone: [phone] Fax : [phone]   Subjective: The patient's granddaughter had a discussion with Dr. Antony Baumgartner this weekend about his esophagus full of food and she was sent in with the options of a POEM's procedure at a tertiary care center versus injection of Botox in the LES to help facilitate esophageal emptying the option of pneumatic dilatation of the esophagus which would also be done at a tertiary care center.   Objective: Vital signs in last 24 hours: Vitals:   10/13/23 2007 10/14/23 0505 10/14/23 0724 10/14/23 0754  BP: (!) 161/80 (!) 149/63  (!) 170/77  Pulse: 72 67  71  Resp: 18 20  20   Temp: 98.4 F (36.9 C) 97.9 F (36.6 C)  98.4 F (36.9 C)  TempSrc: Oral Oral  Oral  SpO2: 90% 93%  95%  Weight:   100.1 kg   Height:       Weight change:   Intake/Output Summary (Last 24 hours) at 10/14/2023 1219 Last data filed at 10/14/2023 0500 Gross per 24 hour  Intake 1367.33 ml  Output 400 ml  Net 967.33 ml     Exam: General: The patient is sitting in bed in no apparent distress   Lab Results: @LABTEST2 @ Micro Results: Recent Results (from the past 240 hours)  Blood Culture (routine x 2)     Status: None   Collection Time: 10/05/23  5:06 AM   Specimen: BLOOD  Result Value Ref Range Status   Specimen Description BLOOD LA  Final   Special Requests   Final    BOTTLES DRAWN AEROBIC AND ANAEROBIC Blood Culture results may not be optimal due to an inadequate volume of blood received in culture bottles   Culture   Final    NO GROWTH 5 DAYS Performed at [facility], [address] [place], [place] [address]    Report Status 10/10/2023 FINAL  Final  Blood Culture (routine x 2)     Status: None   Collection Time: 10/05/23  5:07 AM   Specimen: BLOOD  Result Value Ref Range Status   Specimen Description BLOOD RA  Final   Special Requests   Final    BOTTLES DRAWN AEROBIC AND  ANAEROBIC Blood Culture results may not be optimal due to an inadequate volume of blood received in culture bottles   Culture   Final    NO GROWTH 5 DAYS Performed at [facility], [address] [place], [place] [address]    Report Status 10/10/2023 FINAL  Final  Resp panel by RT-PCR (RSV, Flu A&B, Covid) Anterior Nasal Swab     Status: None   Collection Time: 10/05/23  5:56 AM   Specimen: Anterior Nasal Swab  Result Value Ref Range Status   SARS Coronavirus 2 by RT PCR NEGATIVE NEGATIVE Final    Comment: (NOTE) SARS-CoV-2 target nucleic acids are NOT DETECTED.  The SARS-CoV-2 RNA is generally detectable in upper respiratory specimens during the acute phase of infection. The lowest concentration of SARS-CoV-2 viral copies this assay can detect is 138 copies/mL. A negative result does not preclude SARS-Cov-2 infection and should not be used as the sole basis for treatment or other patient management decisions. A negative result may occur with  improper specimen collection/handling, submission of specimen other than nasopharyngeal swab, presence of viral mutation(s) within the areas targeted by this assay, and inadequate number of viral copies(<138 copies/mL). A negative result  must be combined with clinical observations, patient history, and epidemiological information. The expected result is Negative.  Fact Sheet for Patients:  BloggerCourse.com  Fact Sheet for Healthcare Providers:  SeriousBroker.it  This test is no t yet approved or cleared by the [place]  FDA and  has been authorized for detection and/or diagnosis of SARS-CoV-2 by FDA under an Emergency Use Authorization (EUA). This EUA will remain  in effect (meaning this test can be used) for the duration of the COVID-19 declaration under Section 564(b)(1) of the Act, 21 U.S.C.section 360bbb-3(b)(1), unless the authorization is terminated  or revoked sooner.        Influenza A by PCR NEGATIVE NEGATIVE Final   Influenza B by PCR NEGATIVE NEGATIVE Final    Comment: (NOTE) The Xpert Xpress SARS-CoV-2/FLU/RSV plus assay is intended as an aid in the diagnosis of influenza from Nasopharyngeal swab specimens and should not be used as a sole basis for treatment. Nasal washings and aspirates are unacceptable for Xpert Xpress SARS-CoV-2/FLU/RSV testing.  Fact Sheet for Patients: BloggerCourse.com  Fact Sheet for Healthcare Providers: SeriousBroker.it  This test is not yet approved or cleared by the [place]  FDA and has been authorized for detection and/or diagnosis of SARS-CoV-2 by FDA under an Emergency Use Authorization (EUA). This EUA will remain in effect (meaning this test can be used) for the duration of the COVID-19 declaration under Section 564(b)(1) of the Act, 21 U.S.C. section 360bbb-3(b)(1), unless the authorization is terminated or revoked.     Resp Syncytial Virus by PCR NEGATIVE NEGATIVE Final    Comment: (NOTE) Fact Sheet for Patients: BloggerCourse.com  Fact Sheet for Healthcare Providers: SeriousBroker.it  This test is not yet approved or cleared by the [place]  FDA and has been authorized for detection and/or diagnosis of SARS-CoV-2 by FDA under an Emergency Use Authorization (EUA). This EUA will remain in effect (meaning this test can be used) for the duration of the COVID-19 declaration under Section 564(b)(1) of the Act, 21 U.S.C. section 360bbb-3(b)(1), unless the authorization is terminated or revoked.  Performed at [facility], [address] [place], [place] [address]   Respiratory (~20 pathogens) panel by PCR     Status: None   Collection Time: 10/05/23  8:11 AM   Specimen: Nasopharyngeal Swab; Respiratory  Result Value Ref Range Status   Adenovirus NOT DETECTED NOT DETECTED Final    Coronavirus 229E NOT DETECTED NOT DETECTED Final    Comment: (NOTE) The Coronavirus on the Respiratory Panel, DOES NOT test for the novel  Coronavirus (2019 nCoV)    Coronavirus HKU1 NOT DETECTED NOT DETECTED Final   Coronavirus NL63 NOT DETECTED NOT DETECTED Final   Coronavirus OC43 NOT DETECTED NOT DETECTED Final   Metapneumovirus NOT DETECTED NOT DETECTED Final   Rhinovirus / Enterovirus NOT DETECTED NOT DETECTED Final   Influenza A NOT DETECTED NOT DETECTED Final   Influenza B NOT DETECTED NOT DETECTED Final   Parainfluenza Virus 1 NOT DETECTED NOT DETECTED Final   Parainfluenza Virus 2 NOT DETECTED NOT DETECTED Final   Parainfluenza Virus 3 NOT DETECTED NOT DETECTED Final   Parainfluenza Virus 4 NOT DETECTED NOT DETECTED Final   Respiratory Syncytial Virus NOT DETECTED NOT DETECTED Final   Bordetella pertussis NOT DETECTED NOT DETECTED Final   Bordetella Parapertussis NOT DETECTED NOT DETECTED Final   Chlamydophila pneumoniae NOT DETECTED NOT DETECTED Final   Mycoplasma pneumoniae NOT DETECTED NOT DETECTED Final    Comment: Performed at [facility], [address] [place], [place] [address]  Expectorated Sputum Assessment w Gram Stain, Rflx to Resp Cult     Status: None   Collection Time: 10/05/23  9:07 AM   Specimen: Sputum  Result Value Ref Range Status   Specimen Description SPUTUM  Final   Special Requests NONE  Final   Sputum evaluation   Final    Sputum specimen not acceptable for testing.  Please recollect.   C/KERRY NELSON AT 1005 10/05/23.PMF Performed at [facility], [address] [place], [place] [address]    Report Status 10/05/2023 FINAL  Final  Expectorated Sputum Assessment w Gram Stain, Rflx to Resp Cult     Status: None   Collection Time: 10/05/23 10:50 AM  Result Value Ref Range Status   Specimen Description EXPECTORATED SPUTUM  Final   Special Requests NONE  Final   Sputum evaluation   Final    THIS SPECIMEN IS ACCEPTABLE FOR SPUTUM  CULTURE Performed at [facility], [address], [place], [place] [address]    Report Status 10/05/2023 FINAL  Final  Culture, Respiratory w Gram Stain     Status: None   Collection Time: 10/05/23 10:50 AM  Result Value Ref Range Status   Specimen Description   Final    EXPECTORATED SPUTUM Performed at [facility], [address], [place], [place] [address]    Special Requests   Final    NONE Reflexed from [id number] Performed at [facility], [address] [place], [place] [address]    Gram Stain   Final    RARE WBC SEEN RARE Hillis Lu POSITIVE RODS RARE Hillis Lu POSITIVE COCCI RARE GRAM NEGATIVE RODS    Culture   Final    FEW Normal respiratory flora-no Staph aureus or Pseudomonas seen Performed at [facility], [address], [place], [place] [address]    Report Status 10/07/2023 FINAL  Final  MRSA Next Gen by PCR, Nasal     Status: None   Collection Time: 10/06/23 12:57 AM   Specimen: Nasal Mucosa; Nasal Swab  Result Value Ref Range Status   MRSA by PCR Next Gen NOT DETECTED NOT DETECTED Final    Comment: (NOTE) The GeneXpert MRSA Assay (FDA approved for NASAL specimens only), is one component of a comprehensive MRSA colonization surveillance program. It is not intended to diagnose MRSA infection nor to guide or monitor treatment for MRSA infections. Test performance is not FDA approved in patients less than 2 years old. Performed at [facility], [address] [place], [place] [address]   Culture, Respiratory w Gram Stain     Status: None   Collection Time: 10/06/23 11:37 AM   Specimen: INDUCED SPUTUM  Result Value Ref Range Status   Specimen Description   Final    INDUCED SPUTUM Performed at [facility], [address] [place], [place] [address]    Special Requests   Final    NONE Performed at [facility], [address] [place], [place] [address]    Gram Stain   Final    FEW WBC PRESENT,BOTH  PMN AND MONONUCLEAR FEW GRAM POSITIVE RODS    Culture   Final    MODERATE LACTOBACILLUS FERMENTUM Standardized susceptibility testing for this organism is not available. Performed at [facility], [address] [place], [place] [address]    Report Status 10/09/2023 FINAL  Final   Studies/Results: No results found. Medications: I have reviewed the patient's current medications. Scheduled Meds:  amLODipine   2.5 mg Per Tube Daily   Chlorhexidine  Gluconate Cloth  6 each Topical Daily   docusate  100 mg Per Tube BID   enoxaparin  (LOVENOX ) injection  50 mg Subcutaneous Q24H   feeding supplement (OSMOLITE 1.5 CAL)  1,000 mL Per Tube Q24H   feeding supplement (PROSource TF20)  30 mL Per Tube Daily   free water   30 mL Per Tube Q4H   insulin  aspart  0-9 Units Subcutaneous Q4H   ipratropium-albuterol   3 mL Nebulization Q6H   melatonin  5 mg Per Tube QHS   metoCLOPramide  (REGLAN ) injection  10 mg Intravenous Q6H   pantoprazole  (PROTONIX ) IV  40 mg Intravenous Q24H   [START ON 10/15/2023] QUEtiapine   25 mg Per Tube q AM   QUEtiapine   50 mg Per Tube QHS   thiamine   100 mg Per Tube Daily   Continuous Infusions: PRN Meds:.acetaminophen , bisacodyl , haloperidol  lactate, hydrALAZINE , LORazepam , LORazepam , ondansetron  **OR** ondansetron  (ZOFRAN ) IV, mouth rinse, oxyCODONE -acetaminophen , polyethylene glycol   Assessment: Principal Problem:   Multifocal pneumonia Active Problems:   GERD (gastroesophageal reflux disease)   Dysphagia   Essential hypertension, benign   Acute respiratory failure with hypoxia and hypercapnia (HCC)   Sepsis (HCC)   History of dysphagia   Aspiration pneumonia (HCC)   Achalasia of esophagus    Plan: The patient's POA/granddaughter has suggested that we do the Botox injection of the LES with postprocedure testing to see if there is any improvement in the patient's ability to take p.o.'s.  If it does work then it was thought to be temporizing while getting  the patient to a tertiary care center for possible POEM's procedure. The patient will be set up for an EGD for tomorrow with possible injection of Botox.  It is recommended that the patient be intubated for the procedure due to his history of having aspiration and had a previous arrest during a endoscopy in the past.   LOS: 9 days   Marnee Sink, MD.FACG 10/14/2023, 12:19 PM Pager [phone] 7am-5pm  Check AMION for 5pm -7am coverage and on weekends

## 2023-10-14 NOTE — TOC Progression Note (Signed)
 Transition of Care [facility]) - Progression Note    Patient Details  Name: Robert Vega MRN: [id number] Date of Birth: Jan 24, 1941  [facility] CM/SW Contact  Alexandra Ice, RN Phone Number: 10/14/2023, 12:15 PM  Clinical Narrative:      Met with patient and [name], grand-daughter (POA), at bedside. She stated she wanted to make sure TOC was informed that patient is not able to discharge home at this time, and that plan is for him CIR and if not a candidate, he will go to skilled nursing facility. Patient will not be ready until he has completed treatment for his esophagus.   TOC verbalized understanding, she asked for SNF list, TOC list stated she can go to Medicare.gov for SNF list and review the star rating as well.     Expected Discharge Plan and Services                                               Social Determinants of Health (SDOH) Interventions SDOH Screenings   Food Insecurity: Patient Unable To Answer (10/06/2023)  Recent Concern: Food Insecurity - Food Insecurity Present (09/11/2023)   Received from [facility]  Housing: Patient Unable To Answer (10/06/2023)  Recent Concern: Housing - High Risk (09/11/2023)   Received from [facility]  Transportation Needs: Patient Unable To Answer (10/06/2023)  Utilities: Patient Unable To Answer (10/06/2023)  Depression (PHQ2-9): Low Risk  (05/07/2023)  Financial Resource Strain: Medium Risk (09/11/2023)   Received from [facility]  Social Connections: Unknown (10/06/2023)  Tobacco Use: Medium Risk (10/05/2023)    Readmission Risk Interventions     No data to display

## 2023-10-14 NOTE — Progress Notes (Signed)
 Occupational Therapy Treatment Patient Details Name: Robert Vega MRN: [id number] DOB: [birth year] Today's Date: 10/14/2023   History of present illness 83 y.o male with significant PMH of OSA, GERD, Obesity, HTN, Dysphagia: EGD 03/2022 with food in upper esophagus complicated by aspiration event, cardiac arrest with round of CPR, and post resuscitation EGD with concern for lack of peristalsis. Pt presented to ED on 10/05/2023 with hypoxia, fever and generalized weakness; developed acute respiratory failure requiring intubation 5/10 due to aspiration pneumonia, extubated 5/15, but had significant agitation required brief course of Precedex .   OT comments  Robert Vega was seen for OT/PT co-treatment on this date. Upon arrival to room pt in bed with granddaughter at bedside, pt pleasant and agreeable to tx. A&O x3 - self, year, and location (with cues). Significantly improved command following and participation from prior session, however continues to require cues to sequence tasks.   Pt requires MOD A x2 sup>sit, fair sitting balance with BUE support. MIN A don gown, MOD A don B socks in sitting. MOD A x2 + RW sit<>stand and ADL t/f ~25 ft with assist to manage RW and step by step cues to sequence. Improves to MIN A x2 with HHA for ADL t/f ~50 ft. Pt making excellent progress toward goals, will continue to follow POC. Discharge recommendation updated to reflect progress and improved participation/tolerance with activity as pt would benefit from follow up OT >3 hours/day.       If plan is discharge home, recommend the following:  Assistance with cooking/housework;Help with stairs or ramp for entrance;Supervision due to cognitive status;A lot of help with walking and/or transfers;A lot of help with bathing/dressing/bathroom   Equipment Recommendations  Other (comment) (defer to next venue of care)    Recommendations for Other Services      Precautions / Restrictions Precautions Precautions:  Fall Recall of Precautions/Restrictions: Impaired Restrictions Weight Bearing Restrictions Per Provider Order: No       Mobility Bed Mobility Overal bed mobility: Needs Assistance Bed Mobility: Supine to Sit Rolling: Mod assist, +2 for physical assistance              Transfers Overall transfer level: Needs assistance Equipment used: Rolling walker (2 wheels) Transfers: Sit to/from Stand Sit to Stand: Mod assist, +2 physical assistance                 Balance Overall balance assessment: Needs assistance Sitting-balance support: Bilateral upper extremity supported, Feet supported Sitting balance-Leahy Scale: Fair     Standing balance support: Bilateral upper extremity supported Standing balance-Leahy Scale: Poor                             ADL either performed or assessed with clinical judgement   ADL Overall ADL's : Needs assistance/impaired                                       General ADL Comments: MIN A don gown, MOD A don B socks in sitting. MOD A x2 + RW for ADL t/f step by step cues to sequence RW, imporves to MIN A x2 with HHA, decreased cueing required.    Extremity/Trunk Assessment Upper Extremity Assessment Upper Extremity Assessment: Generalized weakness   Lower Extremity Assessment Lower Extremity Assessment: Generalized weakness        Vision       Perception  Praxis     Communication Communication Communication: Impaired Factors Affecting Communication: Difficulty expressing self;Reduced clarity of speech (baseline stutter)   Cognition Arousal: Alert Behavior During Therapy: WFL for tasks assessed/performed Cognition: Cognition impaired   Orientation impairments: Situation     Attention impairment (select first level of impairment): Sustained attention Executive functioning impairment (select all impairments): Sequencing OT - Cognition Comments: states year as 2025, location as hospital with  cues. Improved command following and participation from prior session however continues to require cues to sequence tasks                 Following commands: Impaired Following commands impaired: Follows multi-step commands with increased time      Cueing   Cueing Techniques: Verbal cues, Tactile cues, Visual cues  Exercises      Shoulder Instructions       General Comments max HR 90 bpm with activity    Pertinent Vitals/ Pain       Pain Assessment Pain Assessment: PAINAD Breathing: normal Negative Vocalization: none Facial Expression: smiling or inexpressive Body Language: relaxed Consolability: no need to console PAINAD Score: 0  Home Living Family/patient expects to be discharged to:: Private residence Living Arrangements: Alone Available Help at Discharge: Available 24 hours/day;Family Type of Home: House Home Access: Stairs to enter [employer] of Steps: 2   Home Layout: One level     Bathroom Shower/Tub: Tub/shower unit         Home Equipment: None   Additional Comments: home setup per granddaughter in room. reports pt has girlfriend who could stay 24/7 or pt could go to her house.      Prior Functioning/Environment              Frequency  Min 3X/week        Progress Toward Goals  OT Goals(current goals can now be found in the care plan section)  Progress towards OT goals: Progressing toward goals  Acute Rehab OT Goals Patient Stated Goal: to go home OT Goal Formulation: With patient/family Time For Goal Achievement: 10/25/23 Potential to Achieve Goals: Good ADL Goals Pt Will Perform Grooming: with supervision;sitting Pt Will Perform Lower Body Dressing: with min assist;sit to/from stand Pt Will Transfer to Toilet: with min assist;ambulating Pt Will Perform Toileting - Clothing Manipulation and hygiene: with min assist;sit to/from stand  Plan      Co-evaluation    PT/OT/SLP Co-Evaluation/Treatment: Yes Reason  for Co-Treatment: To address functional/ADL transfers;Necessary to address cognition/behavior during functional activity;For patient/therapist safety PT goals addressed during session: Mobility/safety with mobility OT goals addressed during session: ADL's and self-care      AM-PAC OT "6 Clicks" Daily Activity     Outcome Measure   Help from another person eating meals?: Total (NPO) Help from another person taking care of personal grooming?: A Little Help from another person toileting, which includes using toliet, bedpan, or urinal?: A Lot Help from another person bathing (including washing, rinsing, drying)?: A Lot Help from another person to put on and taking off regular upper body clothing?: A Little Help from another person to put on and taking off regular lower body clothing?: A Lot 6 Click Score: 13    End of Session Equipment Utilized During Treatment: Gait belt;Rolling walker (2 wheels)  OT Visit Diagnosis: Unsteadiness on feet (R26.81);Repeated falls (R29.6);Muscle weakness (generalized) (M62.81)   Activity Tolerance Patient tolerated treatment well   Patient Left in chair;with call bell/phone within reach;with nursing/sitter in room;with family/visitor present  Nurse Communication Mobility status        Time: 8469-6295 OT Time Calculation (min): 37 min  Charges: OT General Charges $OT Visit: 1 Visit OT Treatments $Self Care/Home Management : 8-22 mins  Gordan Latina, M.S. OTR/L  10/14/23, 11:17 AM  ascom [phone]

## 2023-10-14 NOTE — Progress Notes (Signed)
 Progress Note   Patient: Robert Vega HYQ:[id number] DOB: [birth year] DOA: 10/05/2023     9 DOS: the patient was seen and examined on 10/14/2023   Brief hospital course: 83 y.o male with significant PMH of OSA, GERD, Obesity, HTN, Dysphagia: EGD 03/2022 with food in upper esophagus complicated by aspiration event, cardiac arrest with round of CPR, and post resuscitation EGD with concern for lack of peristalsis. Prior EGD 08/2020 with note of abnormal cricopharyngeus, decrease in motility in esophagus, and spastic LES who presented to the ED from with hypoxia, fever and generalized weakness.  Patient developed acute respiratory failure requiring intubation due to aspiration pneumonia, he was treated with broad-spectrum antibiotics and managed in ICU. Condition had improved, extubated 5/15, but has significant agitation required brief course of Precedex . 5/10: Admit to [facility] service with sepsis due to Aspiration Pneumonia.  Course complicated by Acute Respiratory Failure due to Aspiration of vomitus requiring s/p cardiac arrest  transfer to ICU and intubation.  5/12 remains intubated 5/13 remains on vent, s/p PEG tube, +vomiting last night, failed SAT/SBT 5/14 extubated successfully but with severe delirium   Principal Problem:   Multifocal pneumonia Active Problems:   Dysphagia   Acute respiratory failure with hypoxia and hypercapnia (HCC)   GERD (gastroesophageal reflux disease)   Essential hypertension, benign   Sepsis (HCC)   History of dysphagia   Aspiration pneumonia (HCC)   Achalasia of esophagus   Assessment and Plan:  Acute respiratory failure with hypoxemia and hypercapnia secondary to aspiration pneumonia. Bilateral lower lobe aspiration pneumonia. Septic shock secondary to aspiration pneumonia. Initially required intubation, per documentation, patient also required a pressor. Condition seem to be improved, due to severe dysphagia, patient would not be able to take p.o.,  working on tube feeding. Antibiotics are completed. Had increased oxygen requirements yesterday, repeat chest x-ray did not have worsening congestion or pneumonia.  BNP not elevated. Patient was lying down in bed when I was there, he was aspirating.  Has order for elevate head of bed at 45 degree, I have discussed with RN, need to enforce the elevation of bed. Oxygen seem to be better today.  No additional antibiotics needed. Transfer patient to medical floor.   Severe dysphagia with severe esophageal dysmotility and achalasia. Abdominal distention, unable to tolerate the tube feeding. Per record, patient has not been able to tolerate tube feeding for quite a few days, abdominal seem to be distended, but no significant tenderness.  KUB performed on 5/14 did not show any acute changes, bowel sounds are decreased.  Per record, last bowel movement was 5/9. Patient was given Reglan  IV, restarted tube feeding on 5/16 at a lower dose, tolerating well now, will increase the rate.  Patient no longer has any abdominal distention. He had a bowel movements, tube feeding at goal. Patient is seen by GI, considering possible Botox injection into the esophagus tomorrow.   Hypokalemia. Potassium normalized.   Delirium. Acute metabolic encephalopathy. Discussed with granddaughter, patient does not have baseline dementia.  He was requiring Precedex  temporarily, but seemed to be better after given Seroquel . CT head did not show any acute changes. Patient mental status much improved, however, patient still has some daily agitation, sundowning.  Continue Seroquel  of evening, also added Seroquel  25 mg daytime.  Continue as needed Haldol .   Class I obesity with BMI 34.69. Obstruct sleep apnea. Continue to monitor.       Subjective:  Mental status better, off oxygen.  No short of breath.  Physical  Exam: Vitals:   10/13/23 2007 10/14/23 0505 10/14/23 0724 10/14/23 0754  BP: (!) 161/80 (!) 149/63  (!)  170/77  Pulse: 72 67  71  Resp: 18 20  20   Temp: 98.4 F (36.9 C) 97.9 F (36.6 C)  98.4 F (36.9 C)  TempSrc: Oral Oral  Oral  SpO2: 90% 93%  95%  Weight:   100.1 kg   Height:       General exam: Appears calm and comfortable  Respiratory system: Clear to auscultation. Respiratory effort normal. Cardiovascular system: S1 & S2 heard, RRR. No JVD, murmurs, rubs, gallops or clicks. No pedal edema. Gastrointestinal system: Abdomen is nondistended, soft and nontender. No organomegaly or masses felt. Normal bowel sounds heard. Central nervous system: Alert and oriented x2. No focal neurological deficits. Extremities: Symmetric 5 x 5 power. Skin: No rashes, lesions or ulcers Psychiatry:  Mood & affect appropriate.    Data Reviewed:  Results reviewed.  Family Communication: Granddaughter updated at bedside.  Disposition: Status is: Inpatient Remains inpatient appropriate because: Severity of disease, inpatient procedure.     Time spent: 35 minutes  Author: Donaciano Frizzle, MD 10/14/2023 1:21 PM  For on call review [email or web address].

## 2023-10-14 NOTE — Progress Notes (Signed)

## 2023-10-14 NOTE — Progress Notes (Signed)
 Physical Therapy Treatment Patient Details Name: Robert Vega MRN: [id number] DOB: 08-29-1940 Today's Date: 10/14/2023   History of Present Illness 84 y.o male with significant PMH of OSA, GERD, Obesity, HTN, Dysphagia: EGD 03/2022 with food in upper esophagus complicated by aspiration event, cardiac arrest with round of CPR, and post resuscitation EGD with concern for lack of peristalsis. Pt presented to ED on 10/05/2023 with hypoxia, fever and generalized weakness; developed acute respiratory failure requiring intubation 5/10 due to aspiration pneumonia, extubated 5/15, but had significant agitation required brief course of Precedex .    PT Comments  PT/OT co-treatment performed with grand daughter at bedside- very involved and able to assist with subjective history. Pt awake and alert and agreeable to session. Pt reports he has had multiple falls although is very independent at home. Supportive family available at discharge. Re-assessment performed this date with pt able to perform mobility with +2 assist including short distance ambulation and sitting in recliner. Still presents with cognitive impairment/balance/strength/mobility deficits. Pt very eager to get OOB and responds well to short commands. Highly motivated to return to PLOF. Updated dispo to care team. Will continue to progress.   If plan is discharge home, recommend the following: Two people to help with walking and/or transfers;Two people to help with bathing/dressing/bathroom   Can travel by private vehicle     Yes  Equipment Recommendations   (TBD)    Recommendations for Other Services       Precautions / Restrictions Precautions Precautions: Fall Recall of Precautions/Restrictions: Impaired Restrictions Weight Bearing Restrictions Per Provider Order: No     Mobility  Bed Mobility Overal bed mobility: Needs Assistance Bed Mobility: Supine to Sit Rolling: Mod assist, +2 for physical assistance   Supine to sit:  Mod assist, +2 for physical assistance     General bed mobility comments: needs short simple commands to follow directions. Slight dizziness noted upon sitting at EOB    Transfers Overall transfer level: Needs assistance Equipment used: Rolling walker (2 wheels) Transfers: Sit to/from Stand Sit to Stand: Mod assist, +2 physical assistance           General transfer comment: needs several attempts to stand including assistance with placement of B UE. Once standing, post leaning noted needing heavy assist to correct in addition to pre gait activities    Ambulation/Gait Ambulation/Gait assistance: Min assist, +2 physical assistance Gait Distance (Feet): 75 Feet Assistive device: Rolling walker (2 wheels), 2 person hand held assist Gait Pattern/deviations: Step-to pattern       General Gait Details: chair follow required. Initial ambulation with RW needing max assist for sequencing of device and attention to task. RW removed and pt improved with +2 HHA. Attempted with single HHA, however pt reaching for IV pole. Decreased step length noted on R LE and needs reminders for upright posture. HR at 92bpm with exertion   Stairs             Wheelchair Mobility     Tilt Bed    Modified Rankin (Stroke Patients Only)       Balance Overall balance assessment: Needs assistance Sitting-balance support: Bilateral upper extremity supported, Feet supported Sitting balance-Leahy Scale: Fair     Standing balance support: Bilateral upper extremity supported Standing balance-Leahy Scale: Poor                              Communication Communication Communication: Impaired Factors Affecting Communication: Difficulty expressing self;Reduced  clarity of speech (baseline stutter)  Cognition Arousal: Alert Behavior During Therapy: WFL for tasks assessed/performed   PT - Cognitive impairments: Orientation, Awareness, Memory                       PT -  Cognition Comments: alert to self/place/year. Intermittent confusion to situation/history Following commands: Impaired Following commands impaired: Follows multi-step commands with increased time    Cueing    Exercises Other Exercises Other Exercises: seated/standing acitivites including reaching with B UE in forward direction, LAQ, standing weight shifts and heel raises. min assist required and appears weaker on R LE.    General Comments General comments (skin integrity, edema, etc.): max HR 90 bpm with activity      Pertinent Vitals/Pain Pain Assessment Pain Assessment: No/denies pain    Home Living Family/patient expects to be discharged to:: Private residence Living Arrangements: Alone Available Help at Discharge: Available 24 hours/day;Family Type of Home: House Home Access: Stairs to enter   [employer] of Steps: 2   Home Layout: One level Home Equipment: None Additional Comments: home setup per granddaughter in room. reports pt has girlfriend who could stay 24/7 or pt could go to her house.    Prior Function            PT Goals (current goals can now be found in the care plan section) Acute Rehab PT Goals Patient Stated Goal: to get out of the hospital PT Goal Formulation: With patient Time For Goal Achievement: 10/25/23 Progress towards PT goals: Progressing toward goals    Frequency    Min 3X/week      PT Plan      Co-evaluation PT/OT/SLP Co-Evaluation/Treatment: Yes Reason for Co-Treatment: To address functional/ADL transfers;Necessary to address cognition/behavior during functional activity;For patient/therapist safety PT goals addressed during session: Mobility/safety with mobility OT goals addressed during session: ADL's and self-care      AM-PAC PT "6 Clicks" Mobility   Outcome Measure  Help needed turning from your back to your side while in a flat bed without using bedrails?: A Lot Help needed moving from lying on your back to  sitting on the side of a flat bed without using bedrails?: A Lot Help needed moving to and from a bed to a chair (including a wheelchair)?: A Lot Help needed standing up from a chair using your arms (e.g., wheelchair or bedside chair)?: A Lot Help needed to walk in hospital room?: A Lot Help needed climbing 3-5 steps with a railing? : Total 6 Click Score: 11    End of Session Equipment Utilized During Treatment: Gait belt Activity Tolerance: Patient tolerated treatment well Patient left: in chair;with chair alarm set;with nursing/sitter in room Nurse Communication: Mobility status PT Visit Diagnosis: Unsteadiness on feet (R26.81);Repeated falls (R29.6);Muscle weakness (generalized) (M62.81);History of falling (Z91.81);Difficulty in walking, not elsewhere classified (R26.2)     Time: 7425-9563 PT Time Calculation (min) (ACUTE ONLY): 37 min  Charges:    $Gait Training: 8-22 mins PT General Charges $$ ACUTE PT VISIT: 1 Visit                     Amparo Balk, PT, DPT, GCS [phone]    Keionte Swicegood 10/14/2023, 11:50 AM

## 2023-10-15 ENCOUNTER — Encounter
Admission: EM | Discharge status: Against Medical Advice | Payer: Self-pay | Source: Home / Self Care | Attending: Internal Medicine

## 2023-10-15 ENCOUNTER — Inpatient Hospital Stay

## 2023-10-15 ENCOUNTER — Encounter: Payer: Self-pay | Admitting: Internal Medicine

## 2023-10-15 DIAGNOSIS — K2289 Other specified disease of esophagus: Secondary | ICD-10-CM

## 2023-10-15 DIAGNOSIS — J189 Pneumonia, unspecified organism: Secondary | ICD-10-CM | POA: Diagnosis not present

## 2023-10-15 DIAGNOSIS — K220 Achalasia of cardia: Secondary | ICD-10-CM | POA: Diagnosis not present

## 2023-10-15 DIAGNOSIS — Z931 Gastrostomy status: Secondary | ICD-10-CM

## 2023-10-15 DIAGNOSIS — I10 Essential (primary) hypertension: Secondary | ICD-10-CM | POA: Diagnosis not present

## 2023-10-15 DIAGNOSIS — Z87891 Personal history of nicotine dependence: Secondary | ICD-10-CM | POA: Diagnosis not present

## 2023-10-15 DIAGNOSIS — J9601 Acute respiratory failure with hypoxia: Secondary | ICD-10-CM | POA: Diagnosis not present

## 2023-10-15 DIAGNOSIS — R509 Fever, unspecified: Secondary | ICD-10-CM | POA: Diagnosis not present

## 2023-10-15 DIAGNOSIS — T17908A Unspecified foreign body in respiratory tract, part unspecified causing other injury, initial encounter: Secondary | ICD-10-CM | POA: Diagnosis not present

## 2023-10-15 DIAGNOSIS — J9602 Acute respiratory failure with hypercapnia: Secondary | ICD-10-CM | POA: Diagnosis not present

## 2023-10-15 DIAGNOSIS — A419 Sepsis, unspecified organism: Secondary | ICD-10-CM | POA: Diagnosis not present

## 2023-10-15 HISTORY — PX: ESOPHAGOGASTRODUODENOSCOPY: SHX5428

## 2023-10-15 LAB — GLUCOSE, CAPILLARY
Glucose-Capillary: 100 mg/dL — ABNORMAL HIGH (ref 70–99)
Glucose-Capillary: 104 mg/dL — ABNORMAL HIGH (ref 70–99)
Glucose-Capillary: 107 mg/dL — ABNORMAL HIGH (ref 70–99)
Glucose-Capillary: 112 mg/dL — ABNORMAL HIGH (ref 70–99)
Glucose-Capillary: 133 mg/dL — ABNORMAL HIGH (ref 70–99)
Glucose-Capillary: 83 mg/dL (ref 70–99)

## 2023-10-15 MED ORDER — LIDOCAINE HCL (CARDIAC) PF 100 MG/5ML IV SOSY
PREFILLED_SYRINGE | INTRAVENOUS | Status: DC | PRN
  Administered 2023-10-15: 100 mg via INTRAVENOUS

## 2023-10-15 MED ORDER — IPRATROPIUM-ALBUTEROL 0.5-2.5 (3) MG/3ML IN SOLN: 3.0000 mL | RESPIRATORY_TRACT | Status: DC | PRN

## 2023-10-15 MED ORDER — SODIUM CHLORIDE 0.9 % IV SOLN: INTRAVENOUS | Status: DC

## 2023-10-15 MED ORDER — PROPOFOL 10 MG/ML IV BOLUS
INTRAVENOUS | Status: DC | PRN
  Administered 2023-10-15: 125 ug/kg/min via INTRAVENOUS

## 2023-10-15 MED ORDER — SODIUM CHLORIDE (PF) 0.9 % IJ SOLN
100.0000 [IU] | Freq: Once | INTRAMUSCULAR | Status: AC
  Administered 2023-10-15: 4 mL via SUBMUCOSAL
  Filled 2023-10-15: qty 100

## 2023-10-15 MED ORDER — EPHEDRINE SULFATE-NACL 50-0.9 MG/10ML-% IV SOSY
PREFILLED_SYRINGE | INTRAVENOUS | Status: DC | PRN
  Administered 2023-10-15 (×2): 10 mg via INTRAVENOUS

## 2023-10-15 MED ORDER — SUCCINYLCHOLINE CHLORIDE 200 MG/10ML IV SOSY
PREFILLED_SYRINGE | INTRAVENOUS | Status: DC | PRN
  Administered 2023-10-15: 80 mg via INTRAVENOUS

## 2023-10-15 MED ORDER — FENTANYL CITRATE (PF) 100 MCG/2ML IJ SOLN
INTRAMUSCULAR | Status: DC | PRN
  Administered 2023-10-15: 50 ug via INTRAVENOUS

## 2023-10-15 MED ORDER — PHENYLEPHRINE 80 MCG/ML (10ML) SYRINGE FOR IV PUSH (FOR BLOOD PRESSURE SUPPORT)
PREFILLED_SYRINGE | INTRAVENOUS | Status: AC
  Filled 2023-10-15: qty 10

## 2023-10-15 MED ORDER — FENTANYL CITRATE (PF) 100 MCG/2ML IJ SOLN
INTRAMUSCULAR | Status: AC
  Filled 2023-10-15: qty 2

## 2023-10-15 MED ORDER — EPHEDRINE 5 MG/ML INJ
INTRAVENOUS | Status: AC
  Filled 2023-10-15: qty 5

## 2023-10-15 MED ORDER — PHENYLEPHRINE 80 MCG/ML (10ML) SYRINGE FOR IV PUSH (FOR BLOOD PRESSURE SUPPORT)
PREFILLED_SYRINGE | INTRAVENOUS | Status: DC | PRN
  Administered 2023-10-15 (×2): 160 ug via INTRAVENOUS

## 2023-10-15 NOTE — Plan of Care (Signed)
  Problem: Clinical Measurements: Goal: Diagnostic test results will improve Outcome: Progressing   Problem: Nutrition: Goal: Adequate nutrition will be maintained Outcome: Progressing   Problem: Safety: Goal: Ability to remain free from injury will improve Outcome: Progressing   Problem: Respiratory: Goal: Ability to maintain adequate ventilation will improve Outcome: Progressing   Problem: Respiratory: Goal: Ability to maintain a clear airway and adequate ventilation will improve Outcome: Progressing

## 2023-10-15 NOTE — Progress Notes (Signed)
 OT Cancellation Note  Patient Details Name: Robert Vega MRN: [id number] DOB: [birth year]   Cancelled Treatment:    Reason Eval/Treat Not Completed: Patient at procedure or test/ unavailable. Chart reviewed, pt off floor for EGD. Will hold this date, continue to follow POC and reattempt as pt available.  Jeselle Hiser 10/15/2023, 1:39 PM

## 2023-10-15 NOTE — Progress Notes (Signed)
 Progress Note   Patient: Robert Vega:[id number] DOB: [birth year] DOA: 10/05/2023     10 DOS: the patient was seen and examined on 10/15/2023   Brief hospital course: 83 y.o male with significant PMH of OSA, GERD, Obesity, HTN, Dysphagia: EGD 03/2022 with food in upper esophagus complicated by aspiration event, cardiac arrest with round of CPR, and post resuscitation EGD with concern for lack of peristalsis. Prior EGD 08/2020 with note of abnormal cricopharyngeus, decrease in motility in esophagus, and spastic LES who presented to the ED from with hypoxia, fever and generalized weakness.  Patient developed acute respiratory failure requiring intubation due to aspiration pneumonia, he was treated with broad-spectrum antibiotics and managed in ICU. Condition had improved, extubated 5/15, but has significant agitation required brief course of Precedex . 5/10: Admit to [facility] service with sepsis due to Aspiration Pneumonia.  Course complicated by Acute Respiratory Failure due to Aspiration of vomitus requiring s/p cardiac arrest  transfer to ICU and intubation.  5/12 remains intubated 5/13 remains on vent, s/p PEG tube, +vomiting last night, failed SAT/SBT 5/14 extubated successfully but with severe delirium   Principal Problem:   Multifocal pneumonia Active Problems:   Dysphagia   Acute respiratory failure with hypoxia and hypercapnia (HCC)   GERD (gastroesophageal reflux disease)   Essential hypertension, benign   Sepsis (HCC)   History of dysphagia   Aspiration pneumonia (HCC)   Achalasia of esophagus   Assessment and Plan: Acute respiratory failure with hypoxemia and hypercapnia secondary to aspiration pneumonia. Bilateral lower lobe aspiration pneumonia. Septic shock secondary to aspiration pneumonia. Initially required intubation, per documentation, patient also required a pressor. Condition seem to be improved, due to severe dysphagia, patient would not be able to take p.o.,  working on tube feeding. Antibiotics are completed. Had increased oxygen requirements yesterday, repeat chest x-ray did not have worsening congestion or pneumonia.  BNP not elevated. Patient was lying down in bed when I was there, he was aspirating.  Has order for elevate head of bed at 45 degree, I have discussed with RN, need to enforce the elevation of bed. Oxygen seem to be better today.  No additional antibiotics needed. Transfer patient to medical floor 5/19.    Severe dysphagia with severe esophageal dysmotility and achalasia. Abdominal distention, unable to tolerate the tube feeding. Per record, patient has not been able to tolerate tube feeding for quite a few days, abdominal seem to be distended, but no significant tenderness.  KUB performed on 5/14 did not show any acute changes, bowel sounds are decreased.  Per record, last bowel movement was 5/9. Patient was given Reglan  IV, restarted tube feeding on 5/16 at a lower dose, tolerating well now, will increase the rate.  Patient no longer has any abdominal distention. He had a bowel movements, tube feeding at goal. Patient is seen by GI, EGD was performed today with injection of Botox.  Repeat barium swallow per recommendation from GI.   Hypokalemia. Potassium normalized.   Delirium. Acute metabolic encephalopathy. Discussed with granddaughter, patient does not have baseline dementia.  He was requiring Precedex  temporarily, but seemed to be better after given Seroquel . CT head did not show any acute changes. Patient mental status much improved, however, patient still has some daily agitation, sundowning.  Continue Seroquel  of evening, also added Seroquel  25 mg daytime.  Continue as needed Haldol . Continue to monitor.   Class I obesity with BMI 34.69. Obstruct sleep apnea. Continue to monitor.        Subjective:  Patient still has some confusion, occasional agitation.  Physical Exam: Vitals:   10/15/23 1450 10/15/23  1500 10/15/23 1504 10/15/23 1514  BP: 126/76 122/74 110/69 107/69  Pulse: 81 85 84   Resp: 18 14 (!) 27   Temp:      TempSrc:      SpO2: 99% 97% 96% 97%  Weight:      Height:       General exam: Appears calm and comfortable  Respiratory system: Clear to auscultation. Respiratory effort normal. Cardiovascular system: S1 & S2 heard, RRR. No JVD, murmurs, rubs, gallops or clicks. No pedal edema. Gastrointestinal system: Abdomen is nondistended, soft and nontender. No organomegaly or masses felt. Normal bowel sounds heard. Central nervous system: Alert and oriented x2. No focal neurological deficits. Extremities: Symmetric 5 x 5 power. Skin: No rashes, lesions or ulcers Psychiatry: Flat affect    Data Reviewed:  Lab results reviewed.  Family Communication: None  Disposition: Status is: Inpatient Remains inpatient appropriate because: Severity of disease, inpatient procedure.     Time spent: 35 minutes  Author: Donaciano Frizzle, MD 10/15/2023 3:47 PM  For on call review [email or web address].

## 2023-10-15 NOTE — Op Note (Signed)
 [facility] Gastroenterology Patient Name: Robert Vega Procedure Date: 10/15/2023 2:03 PM MRN: [id number] Account #: [id number] Date of Birth: 28-Oct-1940 Admit Type: Outpatient Age: 83 Room: [facility] ENDO ROOM 4 Gender: Male Note Status: Finalized Instrument Name: Upper Endoscope [id number] Procedure:             Upper GI endoscopy Indications:           Achalasia Providers:             Marnee Sink MD, MD Medicines:             General Anesthesia Complications:         No immediate complications. Procedure:             Pre-Anesthesia Assessment:                        - Prior to the procedure, a History and Physical was                         performed, and patient medications and allergies were                         reviewed. The patient's tolerance of previous                         anesthesia was also reviewed. The risks and benefits                         of the procedure and the sedation options and risks                         were discussed with the patient. All questions were                         answered, and informed consent was obtained. Prior                         Anticoagulants: The patient has taken no anticoagulant                         or antiplatelet agents. ASA Grade Assessment: III - A                         patient with severe systemic disease. After reviewing                         the risks and benefits, the patient was deemed in                         satisfactory condition to undergo the procedure.                        After obtaining informed consent, the endoscope was                         passed under direct vision. Throughout the procedure,                         the patient's blood pressure, pulse,  and oxygen                         saturations were monitored continuously. The                         Endosonoscope was introduced through the mouth, and                         advanced to the second part of duodenum. The  upper GI                         endoscopy was accomplished without difficulty. The                         patient tolerated the procedure well. Findings:      The lumen of the esophagus was moderately dilated.      There was evidence of a gastrostomy present in the gastric body.      The examined duodenum was normal.      An area at the gastroesophageal junction was successfully injected with       100 units botulinum toxin. Impression:            - Dilation in the entire esophagus.                        - Gastrostomy present.                        - Normal examined duodenum.                        - An area in the gastroesophageal junction                         successfully injected with botulinum toxin.                        - No specimens collected. Recommendation:        - Return patient to hospital ward for ongoing care.                        - NPO.                        - Continue present medications. Procedure Code(s):     --- Professional ---                        [id number], Esophagogastroduodenoscopy, flexible,                         transoral; with directed submucosal injection(s), any                         substance Diagnosis Code(s):     --- Professional ---                        K22.0, Achalasia of cardia CPT copyright 2022 American [employer] Association. All rights reserved. The codes documented in this report are preliminary and upon coder review may  be revised to meet  current compliance requirements. Marnee Sink MD, MD 10/15/2023 2:34:32 PM This report has been signed electronically. Number of Addenda: 0 Note Initiated On: 10/15/2023 2:03 PM Estimated Blood Loss:  Estimated blood loss: none.      [facility]

## 2023-10-15 NOTE — Anesthesia Procedure Notes (Signed)
 Procedure Name: Intubation Date/Time: 10/15/2023 2:17 PM  Performed by: Simona Dublin, CRNAPre-anesthesia Checklist: Patient identified, Emergency Drugs available, Suction available and Patient being monitored Patient Re-evaluated:Patient Re-evaluated prior to induction Oxygen Delivery Method: Circle system utilized Preoxygenation: Pre-oxygenation with 100% oxygen Induction Type: IV induction Ventilation: Mask ventilation without difficulty Laryngoscope Size: McGrath and 4 Grade View: Grade II Tube type: Oral Tube size: 7.0 mm Number of attempts: 1 Airway Equipment and Method: Stylet and Oral airway Placement Confirmation: ETT inserted through vocal cords under direct vision, positive ETCO2 and breath sounds checked- equal and bilateral Secured at: 22 cm Tube secured with: Tape Dental Injury: Teeth and Oropharynx as per pre-operative assessment

## 2023-10-15 NOTE — Transfer of Care (Signed)
 Immediate Anesthesia Transfer of Care Note  Patient: Robert Vega  Procedure(s) Performed: EGD (ESOPHAGOGASTRODUODENOSCOPY)  Patient Location: Endoscopy Unit  Anesthesia Type:General  Level of Consciousness: drowsy  Airway & Oxygen Therapy: Patient Spontanous Breathing and Patient connected to face mask oxygen  Post-op Assessment: Report given to RN and Post -op Vital signs reviewed and stable  Post vital signs: Reviewed and stable  Last Vitals:  Vitals Value Taken Time  BP 110/69 10/15/23 1504  Temp 35.7 C 10/15/23 1444  Pulse 82 10/15/23 1509  Resp 25 10/15/23 1509  SpO2 93 % 10/15/23 1509  Vitals shown include unfiled device data.  Last Pain:  Vitals:   10/15/23 1504  TempSrc:   PainSc: 0-No pain         Complications: There were no known notable events for this encounter.

## 2023-10-15 NOTE — Anesthesia Preprocedure Evaluation (Signed)
 Anesthesia Evaluation  Patient identified by MRN, date of birth, ID band Patient confused    Reviewed: Allergy & Precautions, NPO status , Patient's Chart, lab work & pertinent test results  History of Anesthesia Complications (+) history of anesthetic complications  Airway Mallampati: III  TM Distance: <3 FB Neck ROM: full    Dental  (+) Chipped, Poor Dentition, Missing   Pulmonary neg shortness of breath, former smoker   Pulmonary exam normal        Cardiovascular Exercise Tolerance: Good hypertension, On Medications (-) angina Normal cardiovascular exam     Neuro/Psych  PSYCHIATRIC DISORDERS      negative neurological ROS     GI/Hepatic Neg liver ROS,GERD  Controlled,,  Endo/Other  negative endocrine ROS    Renal/GU Renal disease     Musculoskeletal   Abdominal   Peds  Hematology negative hematology ROS (+)   Anesthesia Other Findings Patient is confused and is A&Ox0. Consent was obtained through patient's medical POA, his granddaughter [name]. Explained that we would be using a ETT for this case and he was high risk for damage to his brain, heart or lungs. She stated she understood and agreed to proceed.    Past Medical History: No date: Allergy No date: Arthritis No date: BPH (benign prostatic hypertrophy) No date: Colonic polyp No date: GERD (gastroesophageal reflux disease)     Comment:  Has LPR No date: Hypertension  Past Surgical History: No date: CATARACT EXTRACTION     Comment:  2011, other in 2013 2011: COLONOSCOPY 08/30/2020: ESOPHAGOGASTRODUODENOSCOPY (EGD) WITH PROPOFOL ; N/A     Comment:  Procedure: ESOPHAGOGASTRODUODENOSCOPY (EGD) WITH               PROPOFOL ;  Surgeon: Luke Salaam, MD;  Location: [facility]               ENDOSCOPY;  Service: Gastroenterology;  Laterality: N/A; No date: skin cancer removal     Reproductive/Obstetrics negative OB ROS                              Anesthesia Physical Anesthesia Plan  ASA: 3  Anesthesia Plan: General ETT   Post-op Pain Management: Minimal or no pain anticipated   Induction: Intravenous and Rapid sequence  PONV Risk Score and Plan: 2 and Ondansetron  and Dexamethasone  Airway Management Planned: Oral ETT  Additional Equipment:   Intra-op Plan:   Post-operative Plan: Extubation in OR  Informed Consent: I have reviewed the patients History and Physical, chart, labs and discussed the procedure including the risks, benefits and alternatives for the proposed anesthesia with the patient or authorized representative who has indicated his/her understanding and acceptance.     Dental Advisory Given and Consent reviewed with POA  Plan Discussed with: Anesthesiologist, CRNA and Surgeon  Anesthesia Plan Comments: (Patient's granddaughter [name], the HCPOA, consented for risks of anesthesia including but not limited to:  - adverse reactions to medications - damage to eyes, teeth, lips or other oral mucosa - nerve damage due to positioning  - sore throat or hoarseness - Damage to heart, brain, nerves, lungs, other parts of body or loss of life  Patient's POA voiced understanding and assent.)       Anesthesia Quick Evaluation

## 2023-10-15 NOTE — Progress Notes (Signed)
 Physical Therapy Treatment Patient Details Name: Robert Vega MRN: [id number] DOB: 1941-05-04 Today's Date: 10/15/2023   History of Present Illness 83 y.o male with significant PMH of OSA, GERD, Obesity, HTN, Dysphagia: EGD 03/2022 with food in upper esophagus complicated by aspiration event, cardiac arrest with round of CPR, and post resuscitation EGD with concern for lack of peristalsis. Pt presented to ED on 10/05/2023 with hypoxia, fever and generalized weakness; developed acute respiratory failure requiring intubation 5/10 due to aspiration pneumonia, extubated 5/15, but had significant agitation required brief course of Precedex .    PT Comments  Pt is making limited progress towards goals. Very lethargic, able to participate once seated at EOB, however remains very confused. Noted to be incontinent and needed full bed/linen change. Returned back to bed with upcoming procedure. Will continue to progress.   If plan is discharge home, recommend the following: Two people to help with walking and/or transfers;Two people to help with bathing/dressing/bathroom   Can travel by private vehicle     Yes  Equipment Recommendations   (TBD)    Recommendations for Other Services       Precautions / Restrictions Precautions Precautions: Fall Recall of Precautions/Restrictions: Impaired Restrictions Weight Bearing Restrictions Per Provider Order: No     Mobility  Bed Mobility Overal bed mobility: Needs Assistance Bed Mobility: Supine to Sit Rolling: Mod assist   Supine to sit: Max assist     General bed mobility comments: needs heavy assist for sequencing with poor initiation. Once seated, constant min/mod assist fading to CGA with time and balance activities    Transfers Overall transfer level: Needs assistance Equipment used: 2 person hand held assist Transfers: Sit to/from Stand Sit to Stand: Mod assist, +2 physical assistance           General transfer comment: needs  cues for sequencing. once standing, able to stand for extended time with 1 assist while 2nd person assisted with hygiene.    Ambulation/Gait Ambulation/Gait assistance: Mod assist, +2 physical assistance Gait Distance (Feet): 2 Feet Assistive device: 2 person hand held assist Gait Pattern/deviations: Step-to pattern       General Gait Details: step to gait over to recliner and then back to bed due to upcoming procedure. Pt tries to sit prior to safe seated surface with cues for safety. Pt has difficulty with sequencing and following commands this session. not safe to progress further ambulation without additional support   Stairs             Wheelchair Mobility     Tilt Bed    Modified Rankin (Stroke Patients Only)       Balance Overall balance assessment: Needs assistance Sitting-balance support: Bilateral upper extremity supported, Feet supported Sitting balance-Leahy Scale: Fair     Standing balance support: Bilateral upper extremity supported Standing balance-Leahy Scale: Poor                              Communication Communication Communication: Impaired Factors Affecting Communication: Difficulty expressing self;Reduced clarity of speech  Cognition Arousal: Lethargic Behavior During Therapy: WFL for tasks assessed/performed   PT - Cognitive impairments: Orientation, Awareness, Memory                       PT - Cognition Comments: alert to self only. More confused than previous date and lethargic. Does awaken when position changed and pt able to particiapte Following commands: Impaired Following commands  impaired: Follows one step commands inconsistently    Cueing Cueing Techniques: Verbal cues, Tactile cues, Visual cues  Exercises Other Exercises Other Exercises: seated balance activities including reaching with B UE and weight shifts. Other Exercises: pt soiled in bed. Assisted with standing/hygiene/bed change with sitter  present. Other Exercises: once seated at EOB, pt able to attempt donning B socks. Able to reach figure 4 position and don sock once therapist initiated    General Comments        Pertinent Vitals/Pain Pain Assessment Pain Assessment: No/denies pain    Home Living                          Prior Function            PT Goals (current goals can now be found in the care plan section) Acute Rehab PT Goals Patient Stated Goal: to get out of the hospital PT Goal Formulation: With patient Time For Goal Achievement: 10/25/23 Progress towards PT goals: Progressing toward goals    Frequency    Min 3X/week      PT Plan      Co-evaluation              AM-PAC PT "6 Clicks" Mobility   Outcome Measure  Help needed turning from your back to your side while in a flat bed without using bedrails?: A Lot Help needed moving from lying on your back to sitting on the side of a flat bed without using bedrails?: A Lot Help needed moving to and from a bed to a chair (including a wheelchair)?: A Lot Help needed standing up from a chair using your arms (e.g., wheelchair or bedside chair)?: A Lot Help needed to walk in hospital room?: A Lot Help needed climbing 3-5 steps with a railing? : Total 6 Click Score: 11    End of Session Equipment Utilized During Treatment: Gait belt Activity Tolerance: Patient limited by lethargy Patient left: in bed (with sitter present) Nurse Communication: Mobility status PT Visit Diagnosis: Unsteadiness on feet (R26.81);Repeated falls (R29.6);Muscle weakness (generalized) (M62.81);History of falling (Z91.81);Difficulty in walking, not elsewhere classified (R26.2)     Time: 1610-9604 PT Time Calculation (min) (ACUTE ONLY): 20 min  Charges:    $Therapeutic Activity: 8-22 mins PT General Charges $$ ACUTE PT VISIT: 1 Visit                     Amparo Balk, PT, DPT, GCS [phone]    Alcus Bradly 10/15/2023, 1:37 PM

## 2023-10-15 NOTE — Progress Notes (Addendum)
 Inpatient Rehab Admissions Coordinator:   Consult received and chart reviewed.  Plan for GI intervention today and requiring vent for that procedure.  Will follow for extubation and participation with therapies.  Also will need plan for 24/7 support at discharge if to be considered for CIR.    Loye Rumble, PT, DPT Admissions Coordinator [phone] 10/15/23  8:43 AM

## 2023-10-15 NOTE — Progress Notes (Signed)
                                                     Palliative Care Progress Note, Assessment & Plan   Patient Name: Robert Vega       Date: 10/15/2023 DOB: [birth year]  Age: 83 y.o. MRN#: [id number] Attending Physician: Donaciano Frizzle, MD Primary Care Physician: Helaine Llanos, MD Admit Date: 10/05/2023  Subjective: Patient is lying in bed with the lights turned off.  He is asleep in no apparent distress.  He does not awaken to my presence.  Nurse tech is at bedside during my visit.  No family or friends present during my visit.  HPI: 83 y.o. male  with past medical history of dysphagia (s/p EGD 03/2022 with food impaction in upper esophagus-suffered aspiration and cardiac arrest--concern for lack or peristalsis, OSA and HTN admitted from home on 10/05/2023 with hypoxia, fever and generalized weakness.   Admitted initially to TRH service, unfortunately overnight suffered likely aspiration event of vomitus--transferred to ICU and required mechanical ventilation   5/11: bronch 5/13: PEG placed, patient failed wean trial 5/14: vomiting X 2 overnight, successfully extubated to RA 5/15: AM agitation, remains confused but pleasant 5/19: Gdtr at bedside, in agreement with GI recs for botox injection and possible POEM at tertiary center   Palliative medicine was consulted for assisting with goals of care conversations.   Summary of counseling/coordination of care: Extensive chart review completed prior to meeting patient including labs, vital signs, imaging, progress notes, orders, and available advanced directive documents from current and previous encounters.   After reviewing the patient's chart and assessing the patient at bedside, I spoke with patient in regards to symptom management and goals of care.   Patient is asleep and unable to participate in  medical decision-making or symptom assessment at this time.  Nurse tech endorses that patient has slept through the morning without complication.  No agitation noted.  No adjustment to [facility] needed at this time.  Feeding tube is turned off and disconnected and patient is NPO.  Awaiting procedure by GI for today.  No acute palliative needs at this time.  PMT will continue to follow and support patient throughout his hospitalization.  Patient's granddaughter and son have PMT contact info and have been advised to reach out to PMT with any palliative concerns.  Physical Exam Constitutional:      General: He is not in acute distress.    Appearance: Normal appearance. He is obese.  Cardiovascular:     Pulses: Normal pulses.  Pulmonary:     Effort: Pulmonary effort is normal.  Abdominal:     Palpations: Abdomen is soft.  Musculoskeletal:     Comments: Generalized weakness  Skin:    General: Skin is warm and dry.  Psychiatric:        Behavior: Behavior normal.             Total Time 25 minutes   Time spent includes: Detailed review of medical records (labs, imaging, vital signs), medically appropriate exam (mental status, respiratory, cardiac, skin), discussed with treatment team, counseling and educating patient, family and staff, documenting clinical information, medication management and coordination of care.  Judeen Nose L. Rebbeca Campi, DNP, FNP-BC Palliative Medicine Team

## 2023-10-16 ENCOUNTER — Encounter: Payer: Self-pay | Admitting: Gastroenterology

## 2023-10-16 ENCOUNTER — Inpatient Hospital Stay

## 2023-10-16 DIAGNOSIS — R1310 Dysphagia, unspecified: Secondary | ICD-10-CM | POA: Diagnosis not present

## 2023-10-16 DIAGNOSIS — R1319 Other dysphagia: Secondary | ICD-10-CM | POA: Diagnosis not present

## 2023-10-16 DIAGNOSIS — K220 Achalasia of cardia: Secondary | ICD-10-CM | POA: Diagnosis not present

## 2023-10-16 DIAGNOSIS — A419 Sepsis, unspecified organism: Secondary | ICD-10-CM | POA: Diagnosis not present

## 2023-10-16 DIAGNOSIS — I10 Essential (primary) hypertension: Secondary | ICD-10-CM | POA: Diagnosis not present

## 2023-10-16 DIAGNOSIS — E669 Obesity, unspecified: Secondary | ICD-10-CM | POA: Insufficient documentation

## 2023-10-16 DIAGNOSIS — T17908A Unspecified foreign body in respiratory tract, part unspecified causing other injury, initial encounter: Secondary | ICD-10-CM | POA: Diagnosis not present

## 2023-10-16 DIAGNOSIS — R410 Disorientation, unspecified: Secondary | ICD-10-CM

## 2023-10-16 DIAGNOSIS — R5381 Other malaise: Secondary | ICD-10-CM

## 2023-10-16 DIAGNOSIS — K219 Gastro-esophageal reflux disease without esophagitis: Secondary | ICD-10-CM | POA: Diagnosis not present

## 2023-10-16 DIAGNOSIS — J189 Pneumonia, unspecified organism: Secondary | ICD-10-CM | POA: Diagnosis not present

## 2023-10-16 DIAGNOSIS — R6520 Severe sepsis without septic shock: Principal | ICD-10-CM | POA: Insufficient documentation

## 2023-10-16 DIAGNOSIS — R509 Fever, unspecified: Secondary | ICD-10-CM | POA: Diagnosis not present

## 2023-10-16 DIAGNOSIS — R140 Abdominal distension (gaseous): Secondary | ICD-10-CM | POA: Diagnosis not present

## 2023-10-16 DIAGNOSIS — J9601 Acute respiratory failure with hypoxia: Secondary | ICD-10-CM | POA: Diagnosis not present

## 2023-10-16 DIAGNOSIS — R6521 Severe sepsis with septic shock: Secondary | ICD-10-CM

## 2023-10-16 LAB — GLUCOSE, CAPILLARY
Glucose-Capillary: 153 mg/dL — ABNORMAL HIGH (ref 70–99)
Glucose-Capillary: 115 mg/dL — ABNORMAL HIGH (ref 70–99)
Glucose-Capillary: 114 mg/dL — ABNORMAL HIGH (ref 70–99)
Glucose-Capillary: 96 mg/dL (ref 70–99)
Glucose-Capillary: 121 mg/dL — ABNORMAL HIGH (ref 70–99)

## 2023-10-16 NOTE — Progress Notes (Signed)
 Nutrition Follow-up  DOCUMENTATION CODES:   Obesity unspecified  INTERVENTION:   -TF via PEG:   Osmolite 1.5 @ 60 ml/hr   60 ml Prosource TF20 daily  100 mg free water  flush every 4 hours    Tube feeding regimen provides 2240 kcal (100% of needs), 110 grams of protein, and 1097 ml of H2O. Total free water : 1697 ml daily  NUTRITION DIAGNOSIS:   Inadequate oral intake related to inability to eat (pt sedated and ventilated) as evidenced by NPO status.  Ongoing  GOAL:   Patient will meet greater than or equal to 90% of their needs  Met with TF  MONITOR:   Diet advancement, Labs, Weight trends, TF tolerance, I & O's, Skin  REASON FOR ASSESSMENT:   Ventilator    ASSESSMENT:   83 y/o male with h/o GERD, esophageal dysphagia, HTN, BPH, mood disorder and hiatal hernia who is admitted with aspiration event, PNA, sepsis, cardiac arrest and dysphagia.  5/13- s/p EGD- found to have food in the entire esophagus and gastric polyps. PEG tube placed  5/14- extubated 5/15- TF re-started 5/20- s/p EGD- esophageal dilation with botox injection  Reviewed I/O's: -175 ml x 24 hours and -175 ml since admission  UOP: 575 ml x 24 hours  Pt sitting up in bed at time of visit, sleeping. No family at bedside. Per sitter, pt was been sleepy today.   Noted TF held yesterday for GI procedure. TF have resumed and pt receiving TF via PEG- Osmolite 1.5 at goal rate of 60 ml/hr. Pt tolerating well.   Wt has been stable over the past week.   Medications reviewed and include colace, lovenox , melatonin, reglan , protonix , and thiamine .  CIR following for possible admission.   Labs reviewed: CBGS: 83-153 (inpatient orders for glycemic control are 0-9 units insulin  aspart every 4 hours).    Diet Order:   Diet Order             Diet NPO time specified  Diet effective midnight                   EDUCATION NEEDS:   No education needs have been identified at this time  Skin:  Skin  Assessment: Skin Integrity Issues: Skin Integrity Issues:: Other (Comment) Other: skin tear to lt lower arm  Last BM:  10/15/23 (type 5)  Height:   Ht Readings from Last 1 Encounters:  10/08/23 5' 9.02" (1.753 m)    Weight:   Wt Readings from Last 1 Encounters:  10/16/23 103.4 kg    Ideal Body Weight:  72.7 kg  BMI:  Body mass index is 33.65 kg/m.  Estimated Nutritional Needs:   Kcal:  2000-2300kcal/day  Protein:  100-115g/day  Fluid:  1.8-2.1L/day    Robert Vega, RD, LDN, CDCES Registered Dietitian III Certified Diabetes Care and Education Specialist If unable to reach this RD, please use "RD Inpatient" group chat on secure chat between hours of 8am-4 pm daily

## 2023-10-16 NOTE — Progress Notes (Addendum)
 Occupational Therapy Treatment Patient Details Name: Robert Vega MRN: [id number] DOB: May 25, 1941 Today's Date: 10/16/2023   History of present illness 83 y.o male with significant PMH of OSA, GERD, Obesity, HTN, Dysphagia: EGD 03/2022 with food in upper esophagus complicated by aspiration event, cardiac arrest with round of CPR, and post resuscitation EGD with concern for lack of peristalsis. Pt presented to ED on 10/05/2023 with hypoxia, fever and generalized weakness; developed acute respiratory failure requiring intubation 5/10 due to aspiration pneumonia, extubated 5/15, but had significant agitation required brief course of Precedex .   OT comments  Robert Vega was seen for OT treatment on this date. Upon arrival to room pt in bed asleep, awakes and pleasantly agreeable to tx session focused on ADLs and balance exercises. Pt oriented to self and location as hospital, and month/year correctly. Unable to state city, date/DOW, or situation.Appears to have more confusion than AM session per PT and sitter report.  Pt requires MIN A + HHA sit<>stand x12 trials from regular bed height, assist for lift off and cues for eccentric control. MIN A x2 + RW for ADL t/f ~20 ft, +2 required for lines mgmt and redirecting pt as pt easily distracted by lines. CGA face washing in standing. Tolerated standing marching in place with 5 sec hold, BUE supported on counter. Tolerated functional reaching task with single UE supported - pt alternated L+ R reaching above head x10 reps and across midline x10 reps. Pt making good progress toward goals, will continue to follow POC. Discharge recommendation remains appropriate.       If plan is discharge home, recommend the following:  Assistance with cooking/housework;Help with stairs or ramp for entrance;Supervision due to cognitive status;A lot of help with walking and/or transfers;A lot of help with bathing/dressing/bathroom   Equipment Recommendations  Other (comment)  (defer)    Recommendations for Other Services      Precautions / Restrictions Precautions Precautions: Fall Recall of Precautions/Restrictions: Impaired Restrictions Weight Bearing Restrictions Per Provider Order: No       Mobility Bed Mobility Overal bed mobility: Needs Assistance Bed Mobility: Supine to Sit, Sit to Supine Rolling: Mod assist   Supine to sit: Min assist          Transfers Overall transfer level: Needs assistance Equipment used: Rolling walker (2 wheels) Transfers: Sit to/from Stand Sit to Stand: Min assist                 Balance Overall balance assessment: Needs assistance Sitting-balance support: No upper extremity supported, Feet supported Sitting balance-Leahy Scale: Fair     Standing balance support: Single extremity supported, During functional activity Standing balance-Leahy Scale: Fair                             ADL either performed or assessed with clinical judgement   ADL Overall ADL's : Needs assistance/impaired                                       General ADL Comments: MIN A + HHA sit<>stand x12 trials, cues for eccentric control. MIN A x2 + RW for ADL t/f ~20 ft, +2 required for lines mgmt and redirecting pt as pt easily distracted by lines. CGA face washing in standing.      Communication Communication Communication: Impaired Factors Affecting Communication: Reduced clarity of speech   Cognition  Arousal: Alert Behavior During Therapy: Restless Cognition: Cognition impaired             OT - Cognition Comments: oriented to self and location as hospital, and month/year correctly. Unable to state city, date/DOW, or situation                 Following commands: Impaired Following commands impaired: Follows one step commands with increased time, Follows multi-step commands inconsistently      Cueing   Cueing Techniques: Verbal cues, Tactile cues, Visual cues  Exercises Other  Exercises Other Exercises: standing functional reaching task and standing marching    Shoulder Instructions       General Comments      Pertinent Vitals/ Pain       Pain Assessment Pain Assessment: No/denies pain  Home Living                                          Prior Functioning/Environment              Frequency  Min 3X/week        Progress Toward Goals  OT Goals(current goals can now be found in the care plan section)  Progress towards OT goals: Progressing toward goals  Acute Rehab OT Goals Patient Stated Goal: to hug his girlfriend OT Goal Formulation: With patient/family Time For Goal Achievement: 10/25/23 Potential to Achieve Goals: Good ADL Goals Pt Will Perform Grooming: with supervision;sitting Pt Will Perform Lower Body Dressing: with min assist;sit to/from stand Pt Will Transfer to Toilet: with min assist;ambulating Pt Will Perform Toileting - Clothing Manipulation and hygiene: with min assist;sit to/from stand  Plan      Co-evaluation                 AM-PAC OT "6 Clicks" Daily Activity     Outcome Measure   Help from another person eating meals?: Total (NPO) Help from another person taking care of personal grooming?: A Little Help from another person toileting, which includes using toliet, bedpan, or urinal?: A Lot Help from another person bathing (including washing, rinsing, drying)?: A Lot Help from another person to put on and taking off regular upper body clothing?: A Little Help from another person to put on and taking off regular lower body clothing?: A Lot 6 Click Score: 13    End of Session Equipment Utilized During Treatment: Gait belt;Rolling walker (2 wheels)  OT Visit Diagnosis: Unsteadiness on feet (R26.81);Repeated falls (R29.6);Muscle weakness (generalized) (M62.81)   Activity Tolerance Patient tolerated treatment well   Patient Left in bed;with call bell/phone within reach;with bed alarm  set;with nursing/sitter in room   Nurse Communication Mobility status        Time: 1411-1445 OT Time Calculation (min): 34 min  Charges: OT General Charges $OT Visit: 1 Visit OT Treatments $Self Care/Home Management : 8-22 mins $Therapeutic Exercise: 8-22 mins  Robert Vega, M.S. OTR/L  10/16/23, 3:50 PM  ascom [phone]

## 2023-10-16 NOTE — Assessment & Plan Note (Addendum)
 Holding Seroquel  until we can use the tube.  Haldol  as needed.  Likely sundown's in the late afternoon early evening.  Discontinued Reglan .

## 2023-10-16 NOTE — Assessment & Plan Note (Addendum)
 Status post botulinum toxin injection on 5/20.  Esophagram showing dilated esophagus distal obstruction.

## 2023-10-16 NOTE — Anesthesia Postprocedure Evaluation (Signed)
 Anesthesia Post Note  Patient: Robert Vega  Procedure(s) Performed: EGD (ESOPHAGOGASTRODUODENOSCOPY)  Patient location during evaluation: Endoscopy Anesthesia Type: General Level of consciousness: awake and alert Pain management: pain level controlled Vital Signs Assessment: post-procedure vital signs reviewed and stable Respiratory status: spontaneous breathing, nonlabored ventilation, respiratory function stable and patient connected to nasal cannula oxygen Cardiovascular status: blood pressure returned to baseline and stable Postop Assessment: no apparent nausea or vomiting Anesthetic complications: no  There were no known notable events for this encounter.   Last Vitals:  Vitals:   10/15/23 1910 10/16/23 0456  BP: 107/72 (!) 157/96  Pulse: 77 77  Resp: 18 20  Temp: 36.8 C 36.4 C  SpO2: 94% 94%    Last Pain:  Vitals:   10/15/23 2143  TempSrc:   PainSc: Asleep                 Enrique Harvest

## 2023-10-16 NOTE — Progress Notes (Signed)
 Physical Therapy Treatment Patient Details Name: Robert Vega MRN: [id number] DOB: 05/23/1941 Today's Date: 10/16/2023   History of Present Illness 83 y.o male with significant PMH of OSA, GERD, Obesity, HTN, Dysphagia: EGD 03/2022 with food in upper esophagus complicated by aspiration event, cardiac arrest with round of CPR, and post resuscitation EGD with concern for lack of peristalsis. Pt presented to ED on 10/05/2023 with hypoxia, fever and generalized weakness; developed acute respiratory failure requiring intubation 5/10 due to aspiration pneumonia, extubated 5/15, but had significant agitation required brief course of Precedex .    PT Comments  Pt is s/p upper endo and botox injection yesterday, new orders to resume therapy services received. Quick assessment performed with improved cognition noted and ability to participate in session. Able to ambulate short distance and follow commands for RW use. Still requires short commands and is unable to dual task. Eager to participate and supportive family at bedside. Session limited due to transport arriving for barium swallow. Will continue to progress. Plan for OT session later today.    If plan is discharge home, recommend the following: A lot of help with walking and/or transfers;A lot of help with bathing/dressing/bathroom;Assist for transportation;Supervision due to cognitive status   Can travel by private vehicle     Yes  Equipment Recommendations   (TBD)    Recommendations for Other Services       Precautions / Restrictions Precautions Precautions: Fall Recall of Precautions/Restrictions: Impaired Restrictions Weight Bearing Restrictions Per Provider Order: No     Mobility  Bed Mobility Overal bed mobility: Needs Assistance Bed Mobility: Supine to Sit     Supine to sit: Mod assist     General bed mobility comments: needs assist for B LE sequencing and scooting out towards EOB. Upright posture noted, however with  pertabations, easily loses balance with mod assist required to regain balance    Transfers Overall transfer level: Needs assistance Equipment used: Rolling walker (2 wheels) Transfers: Sit to/from Stand Sit to Stand: Mod assist           General transfer comment: needs cues for sequencing including hand placement. Takes several attempts for successful standing.    Ambulation/Gait Ambulation/Gait assistance: Min assist Gait Distance (Feet): 40 Feet Assistive device: Rolling walker (2 wheels) Gait Pattern/deviations: Step-to pattern       General Gait Details: ambulated in room. Has difficulty with turns and staying too far away from AD. Needs cues for direction. Unable to dual task during gait and impulsive with stepping. Further distance limited due to transport arriving to take to procedure   Stairs             Wheelchair Mobility     Tilt Bed    Modified Rankin (Stroke Patients Only)       Balance Overall balance assessment: Needs assistance Sitting-balance support: Bilateral upper extremity supported, Feet supported Sitting balance-Leahy Scale: Fair     Standing balance support: Bilateral upper extremity supported Standing balance-Leahy Scale: Fair                              Musician Communication: Impaired Factors Affecting Communication: Difficulty expressing self;Reduced clarity of speech  Cognition Arousal: Alert Behavior During Therapy: WFL for tasks assessed/performed                           PT - Cognition Comments: alert to self and place. Is  able to recognize family member at bedside. Still remains confused to situation Following commands: Impaired Following commands impaired: Follows multi-step commands inconsistently    Cueing Cueing Techniques: Verbal cues, Tactile cues, Visual cues  Exercises Other Exercises Other Exercises: seated balance including pertabations and weight shifts. Easily  loses balance. Improved static sitting balance    General Comments        Pertinent Vitals/Pain Pain Assessment Pain Assessment: No/denies pain    Home Living                          Prior Function            PT Goals (current goals can now be found in the care plan section) Acute Rehab PT Goals Patient Stated Goal: to get out of the hospital PT Goal Formulation: With patient Time For Goal Achievement: 10/25/23 Progress towards PT goals: Progressing toward goals    Frequency    Min 3X/week      PT Plan      Co-evaluation              AM-PAC PT "6 Clicks" Mobility   Outcome Measure  Help needed turning from your back to your side while in a flat bed without using bedrails?: A Lot Help needed moving from lying on your back to sitting on the side of a flat bed without using bedrails?: A Lot Help needed moving to and from a bed to a chair (including a wheelchair)?: A Lot Help needed standing up from a chair using your arms (e.g., wheelchair or bedside chair)?: A Lot Help needed to walk in hospital room?: A Lot Help needed climbing 3-5 steps with a railing? : Total 6 Click Score: 11    End of Session Equipment Utilized During Treatment: Gait belt Activity Tolerance: Patient tolerated treatment well Patient left: in bed (with sitter present and transport to take to swallow study) Nurse Communication: Mobility status PT Visit Diagnosis: Unsteadiness on feet (R26.81);Repeated falls (R29.6);Muscle weakness (generalized) (M62.81);History of falling (Z91.81);Difficulty in walking, not elsewhere classified (R26.2)     Time: 6962-9528 PT Time Calculation (min) (ACUTE ONLY): 15 min  Charges:    $Gait Training: 8-22 mins PT General Charges $$ ACUTE PT VISIT: 1 Visit                     Amparo Balk, PT, DPT, GCS [phone]    Robert Vega 10/16/2023, 1:27 PM

## 2023-10-16 NOTE — Assessment & Plan Note (Addendum)
 BMI 33.16

## 2023-10-16 NOTE — Assessment & Plan Note (Signed)
 Recurrence with different problem.  Intra-abdominal infection secondary to PEG tube not being in the right place.  Elevated white count and fever and acute kidney injury.  Patient brought emergently to the operating room on 5/27 by Dr. Cornel Diesel.  Currently NPO.  Empirically on Zosyn .  (Initial septic shock was present on admission with acute hypoxic respiratory failure, pneumonia bilateral lower lobes.  Patient completed antibiotics).

## 2023-10-16 NOTE — Progress Notes (Signed)
 Palliative Care Progress Note, Assessment & Plan   Patient Name: Robert Vega       Date: 10/16/2023 DOB: 1941-03-11  Age: 83 y.o. MRN#: [id number] Attending Physician: Verla Glaze, MD Primary Care Physician: Helaine Llanos, MD Admit Date: 10/05/2023  Subjective: Patient is lying in bed in no apparent distress.  He acknowledges my presence and is able to make his wishes known.  No family or friends present during my visit.  Nurse tech is at bedside providing pericare during my visit.  HPI: 83 y.o. male  with past medical history of dysphagia (s/p EGD 03/2022 with food impaction in upper esophagus-suffered aspiration and cardiac arrest--concern for lack or peristalsis, OSA and HTN admitted from home on 10/05/2023 with hypoxia, fever and generalized weakness.   Admitted initially to TRH service, unfortunately overnight suffered likely aspiration event of vomitus--transferred to ICU and required mechanical ventilation   5/11: bronch 5/13: PEG placed, patient failed wean trial 5/14: vomiting X 2 overnight, successfully extubated to RA 5/15: AM agitation, remains confused but pleasant 5/19: Gdtr at bedside, in agreement with GI recs for botox injection and possible POEM at tertiary center   Palliative medicine was consulted for assisting with goals of care conversations.  Summary of counseling/coordination of care: Extensive chart review completed prior to meeting patient including labs, vital signs, imaging, progress notes, orders, and available advanced directive documents from current and previous encounters.   After reviewing the patient's chart and assessing the patient at bedside, I spoke with patient in regards to symptom management and goals of care.  Denies pain or discomfort, headache,  nausea/vomiting, or constipation at this time.  He shares he overall feels "fine".  No adjustment to [facility] needed at this time.  Discussed that patient had Botox injections yesterday.  Ongoing evaluation of patient's swallowing will continue with a barium swallow.  Patient shares he did not think that he had had his procedure yet.  He was under the impression the procedure was happening today.  He is forgetful.  He is unable to participate in goals of care or medical decision making independently at this time.  However, he is pleasant and seems unbothered by his mild confusion.  Counseled with nurse tech.  Highlight importance that patient have nothing by mouth.  No adjustment to plan of care at this time.  PMT will continue to follow and support patient throughout his hospitalization and will intervene when any acute palliative needs arise.  Physical Exam Vitals reviewed.  Constitutional:      General: He is not in acute distress.    Appearance: He is obese.  HENT:     Head: Normocephalic.     Nose: Nose normal.     Mouth/Throat:     Mouth: Mucous membranes are moist.  Eyes:     Pupils: Pupils are equal, round, and reactive to light.  Pulmonary:     Effort: Pulmonary effort is normal.  Abdominal:     Palpations: Abdomen is soft.  Skin:    General: Skin is warm and dry.  Neurological:     Mental Status: He is alert.     Comments: Oriented to self and place  Psychiatric:  Mood and Affect: Mood normal.        Behavior: Behavior normal.             Total Time 25 minutes   Time spent includes: Detailed review of medical records (labs, imaging, vital signs), medically appropriate exam (mental status, respiratory, cardiac, skin), discussed with treatment team, counseling and educating patient, family and staff, documenting clinical information, medication management and coordination of care.  Judeen Nose L. Rebbeca Campi, DNP, FNP-BC Palliative Medicine Team

## 2023-10-16 NOTE — Plan of Care (Signed)
  Problem: Education: Goal: Knowledge of General Education information will improve Description: Including pain rating scale, medication(s)/side effects and non-pharmacologic comfort measures Outcome: Progressing   Problem: Health Behavior/Discharge Planning: Goal: Ability to manage health-related needs will improve Outcome: Progressing   Problem: Clinical Measurements: Goal: Ability to maintain clinical measurements within normal limits will improve Outcome: Progressing Goal: Cardiovascular complication will be avoided Outcome: Progressing   Problem: Activity: Goal: Risk for activity intolerance will decrease Outcome: Progressing   Problem: Nutrition: Goal: Adequate nutrition will be maintained Outcome: Progressing   Problem: Coping: Goal: Level of anxiety will decrease Outcome: Progressing   Problem: Elimination: Goal: Will not experience complications related to bowel motility Outcome: Progressing Goal: Will not experience complications related to urinary retention Outcome: Progressing   Problem: Pain Managment: Goal: General experience of comfort will improve and/or be controlled Outcome: Progressing   Problem: Safety: Goal: Ability to remain free from injury will improve Outcome: Progressing   Problem: Skin Integrity: Goal: Risk for impaired skin integrity will decrease Outcome: Progressing

## 2023-10-16 NOTE — Progress Notes (Signed)
 The patient underwent an EGD with Botox injection of the lower esophageal sphincter due to lower esophageal sphincter hypertension causing the patient not being able to empty the esophagus of his ingested food.  The patient had food in the esophagus on yesterday's EGD that had been eaten prior to his admission 11 days ago.  The patient is being evaluated at present for any improvement with a barium swallow.  There is nothing further to offer from a GI point of view at this time.  The patient should follow-up with [facility] where he had his workup done for his esophageal motility disorders in the past.  I will sign off.  Please call if any further GI concerns or questions.  We would like to thank you for the opportunity to participate in the care of Robert Vega.

## 2023-10-16 NOTE — TOC Progression Note (Signed)
 Transition of Care [facility]) - Progression Note    Patient Details  Name: Robert Vega MRN: [id number] Date of Birth: [birth year]  Transition of Care [facility] CM/SW Contact  Alexandra Ice, RN Phone Number: 10/16/2023, 2:31 PM  Clinical Narrative:     Patient planned for esophagram today.  CIR is monitoring patient. TOC will continue to follow patient for potential discharge needs.       Expected Discharge Plan and Services                                               Social Determinants of Health (SDOH) Interventions SDOH Screenings   Food Insecurity: Patient Unable To Answer (10/06/2023)  Recent Concern: Food Insecurity - Food Insecurity Present (09/11/2023)   Received from [facility]  Housing: Patient Unable To Answer (10/06/2023)  Recent Concern: Housing - High Risk (09/11/2023)   Received from [facility]  Transportation Needs: Patient Unable To Answer (10/06/2023)  Utilities: Patient Unable To Answer (10/06/2023)  Depression (PHQ2-9): Low Risk  (05/07/2023)  Financial Resource Strain: Medium Risk (09/11/2023)   Received from [facility]  Social Connections: Unknown (10/06/2023)  Tobacco Use: Medium Risk (10/15/2023)    Readmission Risk Interventions     No data to display

## 2023-10-16 NOTE — Consult Note (Signed)
 Physical Medicine and Rehabilitation Consult Reason for Consult: Multifocal pneumonia Referring Physician: Verla Glaze, MD   HPI: Robert Vega is a 83 y.o. male with PMH of OSA, GERD, class 1 obesity, HTN, dysphagia, EGD on 11/23 with food in upp esophagus complicated by aspiration event, cardiac arrest with round of CPR, and post resuscitation EGD with concern for lack of peristalsis. He presented to the ED on 09/1023 with hypoxia, fever, and generalized weakness and developed acute respiratory failure requiring intubation on 5/10 due to aspiration pneumonia. He was extubated 5/15 but CCB significant agitation requiring a brief course of Precedex . Physical Medicine & Rehabilitation was consulted to assess candidacy for CIR.      Home: Home Living Family/patient expects to be discharged to:: Private residence Living Arrangements: Alone Available Help at Discharge: Available 24 hours/day, Family Type of Home: House Home Access: Stairs to enter Secretary/administrator of Steps: 2 Home Layout: One level Bathroom Shower/Tub: Tub/shower unit Home Equipment: None Additional Comments: home setup per granddaughter in room. reports pt has girlfriend who could stay 24/7 or pt could go to her house.  Functional History: Prior Function Prior Level of Function : Independent/Modified Independent, History of Falls (last six months) Mobility Comments: endorses falls hx, has fallen in garden and on front steps Functional Status:  Mobility: Bed Mobility Overal bed mobility: Needs Assistance Bed Mobility: Supine to Sit Rolling: Mod assist Supine to sit: Max assist General bed mobility comments: needs heavy assist for sequencing with poor initiation. Once seated, constant min/mod assist fading to CGA with time and balance activities Transfers Overall transfer level: Needs assistance Equipment used: 2 person hand held assist Transfers: Sit to/from Stand Sit to Stand: Mod assist, +2  physical assistance General transfer comment: needs cues for sequencing. once standing, able to stand for extended time with 1 assist while 2nd person assisted with hygiene. Ambulation/Gait Ambulation/Gait assistance: Mod assist, +2 physical assistance Gait Distance (Feet): 2 Feet Assistive device: 2 person hand held assist Gait Pattern/deviations: Step-to pattern General Gait Details: step to gait over to recliner and then back to bed due to upcoming procedure. Pt tries to sit prior to safe seated surface with cues for safety. Pt has difficulty with sequencing and following commands this session. not safe to progress further ambulation without additional support    ADL: ADL Overall ADL's : Needs assistance/impaired General ADL Comments: MIN A don gwon, MOD A don B socks in sitting. MOD A x2 + RW for ADL t/f step by step cues to sequence RW, imporves to MIN A x2 with HHA, decreased cueing required.  Cognition: Cognition Orientation Level: Oriented to person, Disoriented to time, Disoriented to situation, Oriented to place Cognition Arousal: Lethargic Behavior During Therapy: [facility] for tasks assessed/performed   ROS +incontinence as per therapy Past Medical History:  Diagnosis Date   Allergy    Arthritis    BPH (benign prostatic hypertrophy)    Colonic polyp    GERD (gastroesophageal reflux disease)    Has LPR   Hypertension    Past Surgical History:  Procedure Laterality Date   CATARACT EXTRACTION     2011, other in 2013   COLONOSCOPY  2011   ESOPHAGOGASTRODUODENOSCOPY N/A 10/15/2023   Procedure: EGD (ESOPHAGOGASTRODUODENOSCOPY);  Surgeon: Marnee Sink, MD;  Location: [facility] ENDOSCOPY;  Service: Endoscopy;  Laterality: N/A;  WILL NEED BOTOX   ESOPHAGOGASTRODUODENOSCOPY (EGD) WITH PROPOFOL  N/A 08/30/2020   Procedure: ESOPHAGOGASTRODUODENOSCOPY (EGD) WITH PROPOFOL ;  Surgeon: Luke Salaam, MD;  Location: [facility]  ENDOSCOPY;  Service: Gastroenterology;  Laterality: N/A;    ESOPHAGOGASTRODUODENOSCOPY (EGD) WITH PROPOFOL  N/A 04/02/2022   Procedure: ESOPHAGOGASTRODUODENOSCOPY (EGD) WITH PROPOFOL ;  Surgeon: Luke Salaam, MD;  Location: [facility] ENDOSCOPY;  Service: Gastroenterology;  Laterality: N/A;   PEG PLACEMENT N/A 10/08/2023   Procedure: INSERTION, PEG TUBE;  Surgeon: Marnee Sink, MD;  Location: [facility] ENDOSCOPY;  Service: Endoscopy;  Laterality: N/A;   skin cancer removal     Family History  Problem Relation Age of Onset   Hypertension Mother    Heart disease Neg Hx    Diabetes Neg Hx    Cancer Neg Hx    Social History:  reports that he quit smoking about 8 years ago. His smoking use included pipe and cigarettes. He started smoking about 59 years ago. He has a 100 pack-year smoking history. He has been exposed to tobacco smoke. He has never used smokeless tobacco. He reports that he does not drink alcohol and does not use drugs. Allergies:  Allergies  Allergen Reactions   Sulfonamide Derivatives    Medications Prior to Admission  Medication Sig Dispense Refill   amLODipine  (NORVASC ) 2.5 MG tablet Take 1 tablet by mouth daily.     fluticasone (FLONASE) 50 MCG/ACT nasal spray Place 2 sprays into both nostrils daily.     acetaminophen  (TYLENOL ) 650 MG CR tablet Take 650 mg by mouth every morning.     aluminum hydroxide-magnesium carbonate (GAVISCON) 95-358 MG/15ML SUSP Take by mouth.     aspirin  81 MG tablet Take 81 mg by mouth daily.     diltiazem  (CARDIZEM  CD) 120 MG 24 hr capsule Take 120 mg by mouth daily.     docusate sodium  (COLACE) 50 MG capsule Take 50 mg by mouth daily as needed for moderate constipation or mild constipation.     GLUCOSAMINE-CHONDROITIN-MSM PO Take by mouth.     lansoprazole  (PREVACID ) 30 MG capsule TAKE ONE CAPSULE BY MOUTH EVERY NIGHT AT BEDTIME 90 capsule 2   Multiple Vitamins-Minerals (MULTIVITAMIN WITH MINERALS) tablet Take 1 tablet by mouth daily.     tadalafil  (CIALIS ) 20 MG tablet TAKE 1/2 TO 1 TABLET BY MOUTH EVERY OTHER DAY AS  NEEDED FOR ERECTILE DYSFUNCTION 5 tablet 11     Blood pressure 130/70, pulse 75, temperature 98.4 F (36.9 C), resp. rate 20, height 5' 9.02" (1.753 m), weight 103.4 kg, SpO2 95%. Physical Exam Gen: no distress, normal appearing, BMI 33.65 HEENT: oral mucosa pink and moist, NCAT Cardio: Reg rate Chest: normal effort, normal rate of breathing Abd: soft, non-distended Ext: no edema Psych: pleasant, normal affect Skin: intact Neuro: lethargic, but easily aroused, moving all extremities antigravity, about to work with therapy  Results for orders placed or performed during the hospital encounter of 10/05/23 (from the past 24 hours)  Glucose, capillary     Status: Abnormal   Collection Time: 10/15/23  5:36 PM  Result Value Ref Range   Glucose-Capillary 107 (H) 70 - 99 mg/dL   Comment 1 Notify RN   Glucose, capillary     Status: Abnormal   Collection Time: 10/15/23  8:16 PM  Result Value Ref Range   Glucose-Capillary 133 (H) 70 - 99 mg/dL  Glucose, capillary     Status: Abnormal   Collection Time: 10/15/23 11:37 PM  Result Value Ref Range   Glucose-Capillary 112 (H) 70 - 99 mg/dL  Glucose, capillary     Status: Abnormal   Collection Time: 10/16/23  4:50 AM  Result Value Ref Range   Glucose-Capillary 153 (  H) 70 - 99 mg/dL  Glucose, capillary     Status: Abnormal   Collection Time: 10/16/23  7:27 AM  Result Value Ref Range   Glucose-Capillary 115 (H) 70 - 99 mg/dL  Glucose, capillary     Status: Abnormal   Collection Time: 10/16/23 11:59 AM  Result Value Ref Range   Glucose-Capillary 114 (H) 70 - 99 mg/dL   No results found.  Assessment/Plan: Diagnosis: Debility 2/2 multifocal pneumonia Does the need for close, 24 hr/day medical supervision in concert with the patient's rehab needs make it unreasonable for this patient to be served in a less intensive setting? Yes Co-Morbidities requiring supervision/potential complications:  1) class 1 obesity: provide dietary education 2)  GERD: continue IV protonix  3) Dysphagia: continue TF 4) HTN: continue amlodipine  5) Constipation: continue colace Due to bladder management, bowel management, safety, skin/wound care, disease management, medication administration, pain management, and patient education, does the patient require 24 hr/day rehab nursing? Yes Does the patient require coordinated care of a physician, rehab nurse, therapy disciplines of PT, OT, SLP to address physical and functional deficits in the context of the above medical diagnosis(es)? Yes Addressing deficits in the following areas: balance, endurance, locomotion, strength, transferring, bowel/bladder control, bathing, dressing, feeding, grooming, toileting, cognition, and psychosocial support Can the patient actively participate in an intensive therapy program of at least 3 hrs of therapy per day at least 5 days per week? Yes The potential for patient to make measurable gains while on inpatient rehab is excellent Anticipated functional outcomes upon discharge from inpatient rehab are supervision  with PT, supervision with OT, supervision with SLP. Estimated rehab length of stay to reach the above functional goals is: 10-14 days  Anticipated discharge destination: Home Overall Rehab/Functional Prognosis: good  POST ACUTE RECOMMENDATIONS: This patient's condition is appropriate for continued rehabilitative care in the following setting: CIR Patient has agreed to participate in recommended program. Yes Note that insurance prior authorization may be required for reimbursement for recommended care.    I have personally performed a face to face diagnostic evaluation of this patient. Additionally, I have examined the patient's medical record including any pertinent labs and radiographic images.    Thanks,  Liam Redhead, MD 10/16/2023

## 2023-10-16 NOTE — Progress Notes (Signed)
 Progress Note   Patient: Robert Vega ZOX:[id number] DOB: 13-Feb-1941 DOA: 10/05/2023     11 DOS: the patient was seen and examined on 10/16/2023   Brief hospital course: 83 y.o male with significant PMH of OSA, GERD, Obesity, HTN, Dysphagia: EGD 03/2022 with food in upper esophagus complicated by aspiration event, cardiac arrest with round of CPR, and post resuscitation EGD with concern for lack of peristalsis. Prior EGD 08/2020 with note of abnormal cricopharyngeus, decrease in motility in esophagus, and spastic LES who presented to the ED from with hypoxia, fever and generalized weakness.  Patient developed acute respiratory failure requiring intubation due to aspiration pneumonia, he was treated with broad-spectrum antibiotics and managed in ICU. Condition had improved, extubated 5/15, but has significant agitation required brief course of Precedex . 5/10: Admit to [facility] service with sepsis due to Aspiration Pneumonia.  Course complicated by Acute Respiratory Failure due to Aspiration of vomitus requiring s/p cardiac arrest  transfer to ICU and intubation.  5/11 flexible bronchoscopy done by critical care team 5/12 remains intubated 5/13 remains on vent, s/p PEG tube, +vomiting last night, failed SAT/SBT 5/14 extubated successfully but with severe delirium 5/20.  Botulinum toxin injection into the lower esophageal sphincter by Dr. Ole Berkeley  5/21.  Esophagram today.  Assessment and Plan: * Dysphagia Likely secondary to achalasia.  Patient has a PEG tube placement.  Patient had Botox injection of the lower esophageal sphincter due to lower esophageal sphincter hypertension causing the patient not being able to swallow and empty the esophagus of his ingested food.  Food was in the esophagus on the EGD yesterday.  Esophagram today.   Achalasia of esophagus Status post botulinum toxin injection on 5/20.  Esophagram today.  Acute respiratory failure with hypoxia and hypercapnia  (HCC) Pneumonia Patient now on room air  Septic shock (HCC) Present on admission with acute hypoxic respiratory failure, pneumonia bilateral lower lobes.  Patient completed antibiotics.  GERD (gastroesophageal reflux disease) PPI   Essential hypertension, benign Continue Norvasc   Obesity (BMI 30-39.9) BMI 33.65  Acute delirium Patient on Seroquel  twice a day.  Get rid of IV Ativan .        Subjective: Patient feels okay.  Offers no complaints.  Wanting to go back on a diet.  Patient had botulinum toxin injection yesterday.  Barium swallow ordered for today.  Physical Exam: Vitals:   10/15/23 1910 10/16/23 0456 10/16/23 0756 10/16/23 1106  BP: 107/72 (!) 157/96 131/76 130/70  Pulse: 77 77 75 75  Resp: 18 20 18 20   Temp: 98.2 F (36.8 C) 97.6 F (36.4 C) 97.9 F (36.6 C) 98.4 F (36.9 C)  TempSrc: Oral     SpO2: 94% 94% 95% 95%  Weight:  103.4 kg    Height:       Physical Exam HENT:     Head: Normocephalic.     Mouth/Throat:     Pharynx: No oropharyngeal exudate.  Eyes:     General: Lids are normal.     Conjunctiva/sclera: Conjunctivae normal.  Cardiovascular:     Rate and Rhythm: Normal rate and regular rhythm.     Heart sounds: Normal heart sounds, S1 normal and S2 normal.  Pulmonary:     Breath sounds: Examination of the right-lower field reveals decreased breath sounds. Examination of the left-lower field reveals decreased breath sounds. Decreased breath sounds present. No wheezing, rhonchi or rales.  Abdominal:     Palpations: Abdomen is soft.     Tenderness: There is no abdominal tenderness.  Musculoskeletal:     Right lower leg: No swelling.     Left lower leg: No swelling.  Skin:    General: Skin is warm.     Findings: No rash.  Neurological:     Mental Status: He is alert and oriented to person, place, and time.     Data Reviewed:  creatinine 0.81, white blood count 10.7, hemoglobin 11.4, platelet count 210  Family Communication:  spoke  with granddaughter on the phone  Disposition: Status is: Inpatient Remains inpatient appropriate because:  barium swallow study today  Planned Discharge Destination: Acute inpatient rehab    Time spent: 28 minutes  Author: Verla Glaze, MD 10/16/2023 2:14 PM  For on call review [email or web address].

## 2023-10-17 DIAGNOSIS — R410 Disorientation, unspecified: Secondary | ICD-10-CM | POA: Diagnosis not present

## 2023-10-17 DIAGNOSIS — R509 Fever, unspecified: Secondary | ICD-10-CM | POA: Diagnosis not present

## 2023-10-17 DIAGNOSIS — K220 Achalasia of cardia: Secondary | ICD-10-CM | POA: Diagnosis not present

## 2023-10-17 DIAGNOSIS — K2100 Gastro-esophageal reflux disease with esophagitis, without bleeding: Secondary | ICD-10-CM

## 2023-10-17 DIAGNOSIS — R1319 Other dysphagia: Secondary | ICD-10-CM | POA: Diagnosis not present

## 2023-10-17 DIAGNOSIS — T17908A Unspecified foreign body in respiratory tract, part unspecified causing other injury, initial encounter: Secondary | ICD-10-CM | POA: Diagnosis not present

## 2023-10-17 DIAGNOSIS — I10 Essential (primary) hypertension: Secondary | ICD-10-CM | POA: Diagnosis not present

## 2023-10-17 DIAGNOSIS — J9601 Acute respiratory failure with hypoxia: Secondary | ICD-10-CM | POA: Diagnosis not present

## 2023-10-17 DIAGNOSIS — Z87898 Personal history of other specified conditions: Secondary | ICD-10-CM | POA: Diagnosis not present

## 2023-10-17 DIAGNOSIS — J189 Pneumonia, unspecified organism: Secondary | ICD-10-CM | POA: Diagnosis not present

## 2023-10-17 DIAGNOSIS — E669 Obesity, unspecified: Secondary | ICD-10-CM | POA: Diagnosis not present

## 2023-10-17 DIAGNOSIS — A419 Sepsis, unspecified organism: Secondary | ICD-10-CM | POA: Diagnosis not present

## 2023-10-17 LAB — GLUCOSE, CAPILLARY
Glucose-Capillary: 109 mg/dL — ABNORMAL HIGH (ref 70–99)
Glucose-Capillary: 119 mg/dL — ABNORMAL HIGH (ref 70–99)
Glucose-Capillary: 121 mg/dL — ABNORMAL HIGH (ref 70–99)
Glucose-Capillary: 122 mg/dL — ABNORMAL HIGH (ref 70–99)
Glucose-Capillary: 129 mg/dL — ABNORMAL HIGH (ref 70–99)
Glucose-Capillary: 136 mg/dL — ABNORMAL HIGH (ref 70–99)
Glucose-Capillary: 93 mg/dL (ref 70–99)

## 2023-10-17 NOTE — Progress Notes (Signed)
 Occupational Therapy Treatment Patient Details Name: Robert Vega MRN: [id number] DOB: March 11, 1941 Today's Date: 10/17/2023   History of present illness 83 y.o male with significant PMH of OSA, GERD, Obesity, HTN, Dysphagia: EGD 03/2022 with food in upper esophagus complicated by aspiration event, cardiac arrest with round of CPR, and post resuscitation EGD with concern for lack of peristalsis. Pt presented to ED on 10/05/2023 with hypoxia, fever and generalized weakness; developed acute respiratory failure requiring intubation 5/10 due to aspiration pneumonia, extubated 5/15, but had significant agitation required brief course of Precedex .   OT comments  Robert Vega was seen for OT treatment on this date. Upon arrival to room pt seated in chair with family at bed side, agreeable to tx. Pt completed the Orientation Log (O-Log) - responses are scored based on spontaneous recall, logical cueing, multiple choice, and incorrect responses. Pt scored 11/30, not oriented to situation/location questions. Poor insight into deficits, pt remains high fall risk.   Pt requires MIN A + RW for chair>toilet t/f ~20 ft, assist for RW mgmt and cues for safety. MIN A + HHA toilet > chair t/f with 1 large LOB requiring MOD A to correct. Completed standing functional balance task opening blinds, requires multiple attempts but no LOBs reaching above head height with CGA. Functional mobility ~30 ft navigating obstacles (gloves on floor) requires MIN A + RW with assist to navigate obstacles. MIN A + grab bar for shower t/f. Pt making good progress toward goals, will continue to follow POC. Discharge recommendation remains appropriate.        If plan is discharge home, recommend the following:  Assistance with cooking/housework;Help with stairs or ramp for entrance;Supervision due to cognitive status;A lot of help with walking and/or transfers;A lot of help with bathing/dressing/bathroom   Equipment Recommendations   Other (comment) (defer to next venue of care)    Recommendations for Other Services      Precautions / Restrictions Precautions Precautions: Fall Recall of Precautions/Restrictions: Impaired Restrictions Weight Bearing Restrictions Per Provider Order: No       Mobility Bed Mobility               General bed mobility comments: not tested    Transfers Overall transfer level: Needs assistance Equipment used: Rolling walker (2 wheels) Transfers: Sit to/from Stand Sit to Stand: Min assist                 Balance Overall balance assessment: Needs assistance Sitting-balance support: No upper extremity supported, Feet supported Sitting balance-Leahy Scale: Good     Standing balance support: Single extremity supported, During functional activity Standing balance-Leahy Scale: Fair                             ADL either performed or assessed with clinical judgement   ADL Overall ADL's : Needs assistance/impaired                                       General ADL Comments: MIN A + RW for toielt t/f, assist for RW mgmt and cues for safety. MIN A + grab bar for shower t/f.    Extremity/Trunk Assessment              Diplomatic Services operational officer Communication Communication: Impaired Factors  Affecting Communication: Reduced clarity of speech (tangential)   Cognition Arousal: Alert Behavior During Therapy: Restless Cognition: Cognition impaired   Orientation impairments: Place, Situation         OT - Cognition Comments: Orientation Log 11/30. Named 9 animals in 1 minute.                 Following commands: Impaired Following commands impaired: Follows one step commands inconsistently      Cueing   Cueing Techniques: Verbal cues, Tactile cues, Visual cues  Exercises      Shoulder Instructions       General Comments      Pertinent Vitals/ Pain       Pain Assessment Pain  Assessment: No/denies pain  Home Living                                          Prior Functioning/Environment              Frequency  Min 3X/week        Progress Toward Goals  OT Goals(current goals can now be found in the care plan section)  Progress towards OT goals: Progressing toward goals  Acute Rehab OT Goals OT Goal Formulation: With patient/family Time For Goal Achievement: 10/25/23 Potential to Achieve Goals: Good ADL Goals Pt Will Perform Grooming: with supervision;sitting Pt Will Perform Lower Body Dressing: with min assist;sit to/from stand Pt Will Transfer to Toilet: with min assist;ambulating Pt Will Perform Toileting - Clothing Manipulation and hygiene: with min assist;sit to/from stand  Plan      Co-evaluation                 AM-PAC OT "6 Clicks" Daily Activity     Outcome Measure   Help from another person eating meals?: None Help from another person taking care of personal grooming?: A Little Help from another person toileting, which includes using toliet, bedpan, or urinal?: A Lot Help from another person bathing (including washing, rinsing, drying)?: A Lot Help from another person to put on and taking off regular upper body clothing?: A Little Help from another person to put on and taking off regular lower body clothing?: A Lot 6 Click Score: 16    End of Session Equipment Utilized During Treatment: Gait belt;Rolling walker (2 wheels)  OT Visit Diagnosis: Unsteadiness on feet (R26.81);Repeated falls (R29.6);Muscle weakness (generalized) (M62.81)   Activity Tolerance Patient tolerated treatment well   Patient Left in chair;with call bell/phone within reach;with family/visitor present;with nursing/sitter in room   Nurse Communication Mobility status        Time: 1413-1440 OT Time Calculation (min): 27 min  Charges: OT General Charges $OT Visit: 1 Visit OT Treatments $Self Care/Home Management : 8-22  mins $Therapeutic Activity: 8-22 mins  Gordan Latina, M.S. OTR/L  10/17/23, 2:59 PM  ascom [phone]

## 2023-10-17 NOTE — Progress Notes (Signed)
 SLP Cancellation Note  Patient Details Name: Robert Vega MRN: [id number] DOB: 09/10/1940   Cancelled treatment:       Reason Eval/Treat Not Completed:  (chart reviewed; consulted MD and NSG via secure chat)   MD reached out to ST services re: pt's swallowing and initiation of diet today post GI tx in recent 2 days. Per chart notes, pt was seen for Bedside eval on 10/05/2023 w/ oropharyngeal ohase swallowing WFL: "Pt seen today with trials of thin liquids (via straw), puree, and regular solids. No overt or subtle s/sx pharyngeal dysphagia noted. No change to vocal quality across trials. Vitals stable for duration of trials (on 6L nasal canula with O2 saturations maintained at 94-97). Oral phase grossly intact.". Also noted, pt's MBSS on 05/2023 revealed similar -- NO laryngeal penetration nor aspiration on the MBSS.  Per chart/GI notes: "EGD 03/2022 with food in upper esophagus complicated by aspiration event, cardiac arrest with round of CPR, and post resuscitation EGD with concern for lack of peristalsis. Prior EGD 08/2020 with note of abnormal cricopharyngeus, decrease in motility in esophagus, and spastic LES. Dilated to 18mm without change. Concern for ineffective esophageal motility.". Pt has reported: "patient notes that when he lays down, he will in most cases start coughing and this can progress to emesis.".   During this admit, pt's course was complicated by ARF d/t aspiration of Vomitus s/p cardiac arrest and transfer to CCU. Pt was successfully extubated ~3 days later; s/p PEG placement.  On 5/202/2025: "Botulinum toxin injection into the lower esophageal sphincter by Dr. Ole Berkeley, GI".  Per MD notes:  "10/16/2023 Esophagram showing diffusely distended esophagus with distal obstruction and severe dysmotility suggestive of Achalasia. 10/17/2023  Advised I would Not give him any food continue PEG feeding he can try liquids to see if that goes down but still high risk for aspiration.".  Per MD  notes:  "Patient wants to try liquids and granddaughter okay with this. Still high risk for aspiration.".    Post consult w/ MD re: pt's oropharyngeal phase swallowing, MD ordered a clear liquid diet. ST posted in chart, room precautions for general aspiration precautions. GI is following for Esophageal phase Dysmotility and precautions w/ oral intake. No skilled ST services indicated at this time. NSG updated.       Darla Edward, MS, CCC-SLP Speech Language Pathologist Rehab Services; [facility] [phone] (ascom) Adelaida Reindel 10/17/2023, 2:37 PM

## 2023-10-17 NOTE — Progress Notes (Signed)
 Inpatient Rehab Coordinator Note:  I spoke with patient's granddaughter, Danelle Dunning [facility]), over the phone to discuss CIR recommendations and goals/expectations of CIR stay.  We reviewed 3 hrs/day of therapy, physician follow up, and average length of stay 2 weeks (dependent upon progress) with goals of supervision 24/7.  She states pt typically lives alone but they have plans for his s/o Ernistine to stay with him 24/7 at discharge for supervision.  Outpatient followup for GI/POEM procedure.  We reviewed need for prior auth with [facility] and I will submit that request today.  We will continue to follow.    Loye Rumble, PT, DPT Admissions Coordinator [phone] 10/17/23  10:30 AM

## 2023-10-17 NOTE — Progress Notes (Signed)
   10/17/23 1545  Spiritual Encounters  Type of Visit Follow up  Care provided to: Patient  Conversation partners present during encounter Nurse  Referral source Family;Other (comment) (Pt's Significant Other)  Reason for visit Routine spiritual support  OnCall Visit Yes  Spiritual Framework  Presenting Themes Goals in life/care;Values and beliefs;Impactful experiences and emotions  Interventions  Spiritual Care Interventions Made Established relationship of care and support;Compassionate presence;Reflective listening;Narrative/life review;Explored values/beliefs/practices/strengths;Prayer;Other (comment) (Pt requested prayer for his Significant other)  Intervention Outcomes  Outcomes Connection to spiritual care;Awareness around self/spiritual resourses;Connection to values and goals of care;Awareness of support

## 2023-10-17 NOTE — Progress Notes (Signed)
 Physical Therapy Treatment Patient Details Name: Robert Vega MRN: [id number] DOB: [birth year] Today's Date: 10/17/2023   History of Present Illness 83 y.o male with significant PMH of OSA, GERD, Obesity, HTN, Dysphagia: EGD 03/2022 with food in upper esophagus complicated by aspiration event, cardiac arrest with round of CPR, and post resuscitation EGD with concern for lack of peristalsis. Pt presented to ED on 10/05/2023 with hypoxia, fever and generalized weakness; developed acute respiratory failure requiring intubation 5/10 due to aspiration pneumonia, extubated 5/15, but had significant agitation required brief course of Precedex .    PT Comments  Pt is making good progress and able to tolerate longer session this date. Pt able to ambulate in hallway, chair follow for safety. Pt follows commands well, and appears motivated to participate. Post session, left in recliner with sitter present. All questions answered by significant other.   If plan is discharge home, recommend the following: A lot of help with walking and/or transfers;A lot of help with bathing/dressing/bathroom;Assist for transportation;Supervision due to cognitive status   Can travel by private vehicle     Yes  Equipment Recommendations   (TBD)    Recommendations for Other Services       Precautions / Restrictions Precautions Precautions: Fall Recall of Precautions/Restrictions: Impaired Restrictions Weight Bearing Restrictions Per Provider Order: No     Mobility  Bed Mobility Overal bed mobility: Needs Assistance Bed Mobility: Supine to Sit, Sit to Supine     Supine to sit: Mod assist     General bed mobility comments: isn't able to initiate mobility, however does follow commands once therapist initiated. Once seated, able to sit with supervision. Improved technique for donning socks while at bedside, only requires min assist    Transfers Overall transfer level: Needs assistance Equipment used: Rolling  walker (2 wheels) Transfers: Sit to/from Stand Sit to Stand: Min assist           General transfer comment: improved technique with RW    Ambulation/Gait Ambulation/Gait assistance: Min assist Gait Distance (Feet): 80 Feet Assistive device: Rolling walker (2 wheels) Gait Pattern/deviations: Step-through pattern       General Gait Details: cues for safety including proximity of RW. Pt easily distracted in hallway. Follows commands well with instructions for sudden stops, turns, and ability to  follow multi step commands with tasking   Stairs             Wheelchair Mobility     Tilt Bed    Modified Rankin (Stroke Patients Only)       Balance Overall balance assessment: Needs assistance Sitting-balance support: No upper extremity supported, Feet supported Sitting balance-Leahy Scale: Good     Standing balance support: Single extremity supported, During functional activity Standing balance-Leahy Scale: Fair                              Hotel manager: Impaired Factors Affecting Communication: Reduced clarity of speech  Cognition Arousal: Alert Behavior During Therapy: Restless   PT - Cognitive impairments: Orientation, Awareness, Memory                       PT - Cognition Comments: remains confused to time and situation. Pleasant. Does not recall previous therapy visits Following commands: Impaired Following commands impaired: Follows one step commands inconsistently    Cueing Cueing Techniques: Verbal cues, Tactile cues, Visual cues  Exercises Other Exercises Other Exercises: standing B LE ther-ex  performed including mini squats, heel raises, and trunk rotation in standing to look over shoulders. All ther-ex performed x 10 reps with min assist. Other Exercises: Pt ambulated in hallway with cognitive task to remember current time. Pt unable to recall current time after approx 3 minutes. Other Exercises:  Pt soiled in bed, assisted sitter to change bedding    General Comments        Pertinent Vitals/Pain Pain Assessment Pain Assessment: No/denies pain    Home Living                          Prior Function            PT Goals (current goals can now be found in the care plan section) Acute Rehab PT Goals Patient Stated Goal: to get out of the hospital PT Goal Formulation: With patient Time For Goal Achievement: 10/25/23 Progress towards PT goals: Progressing toward goals    Frequency    Min 3X/week      PT Plan      Co-evaluation              AM-PAC PT "6 Clicks" Mobility   Outcome Measure  Help needed turning from your back to your side while in a flat bed without using bedrails?: A Little Help needed moving from lying on your back to sitting on the side of a flat bed without using bedrails?: A Little Help needed moving to and from a bed to a chair (including a wheelchair)?: A Little Help needed standing up from a chair using your arms (e.g., wheelchair or bedside chair)?: A Little Help needed to walk in hospital room?: A Little Help needed climbing 3-5 steps with a railing? : Total 6 Click Score: 16    End of Session Equipment Utilized During Treatment: Gait belt Activity Tolerance: Patient tolerated treatment well Patient left: in chair (with sitter present) Nurse Communication: Mobility status PT Visit Diagnosis: Unsteadiness on feet (R26.81);Repeated falls (R29.6);Muscle weakness (generalized) (M62.81);History of falling (Z91.81);Difficulty in walking, not elsewhere classified (R26.2)     Time: 1610-9604 PT Time Calculation (min) (ACUTE ONLY): 30 min  Charges:    $Gait Training: 8-22 mins $Neuromuscular Re-education: 8-22 mins PT General Charges $$ ACUTE PT VISIT: 1 Visit                     Amparo Balk, PT, DPT, GCS [phone]    [place] Sarin[place] 10/17/2023, 4:09 PM

## 2023-10-17 NOTE — PMR Pre-admission (Shared)
 PMR Admission Coordinator Pre-Admission Assessment  Patient: Robert Vega is an 83 y.o., male MRN: [id number] DOB: 05-15-1941 Height: 5' 9.02" (175.3 cm) Weight: 100.5 kg              Insurance Information HMO:     PPO: yes     PCP:      IPA:      80/20:      OTHER:  PRIMARY: [employer]      Policy#: [id number]       Subscriber: pt CM Name: ***      Phone#: [phone] ***     Fax#: [phone] Pre-Cert#: ***      Employer:  Benefits:  Phone #: [phone]     Name:  Venson Ginger. Date: ***     Deduct: ***      Out of Pocket Max: ***      Life Max:   CIR: ***      SNF: *** Outpatient: ***     Co-Pay: *** Home Health: ***      Co-Pay: *** DME: ***     Co-Pay: *** Providers:  SECONDARY:       Policy#:       Phone#:   Financial Counselor:       Phone#:   The "Data Collection Information Summary" for patients in Inpatient Rehabilitation Facilities with attached "Privacy Act Statement-Health Care Records" was provided and verbally reviewed with: Family  Emergency Contact Information Contact Information     Name Relation Home Work Mobile   Galantine,[name]-HCPOA Granddaughter   [phone]   Aahil, Fredin   [phone]   Elon, Eoff         Other Contacts     Name Relation Home Work Mobile   chrismon,ernistine Friend   [phone]      Current Medical History  Patient Admitting Diagnosis: debility 2/2 multifocal PNA, and aspiration even requiring ventilation, NPO  History of Present Illness: Pt is an 83 y/o male with PMH of dysphagia, achalasia/dysmotility disorder, cardiac arrest, OSA, HTN, who was admitted to [facility] on 10/05/23 with hypoxia, fever, and generalized weakness.  In ED febrile 102.1 rectally, O2 mid 80s on RA, mildly tachycardic, lactic acid 2.2.  CXR showed extensive opacities in the lower and middle lobes and he was admitted for CAP vs aspiration PNA.  He was started of ceftriaxone  and azithromycin .  He had vomiting episodes in the ED following  eating and, given history of dysphagia with complications, SLP was consulted.  Recommendations for regular diet with thin liquids and he vomited again with dinner.  Following this episode, pt with worsening rales on exam and increasing O2 requirements ultimately requiring transfer to ICU and intubation.  He did briefly require pressor support.  CTA C/A/P showed severe bilateral multifocal infiltrates, small bilateral pleural effusions, and a large hiatal hernia with markedly dilated fluid-filled esophagus.  GI was consulted and recommended PEG tube given worsening dysphagia and increasing risk for further aspiration events.  On 5/13 underwent PEG and EGD which showed esophagus completely filled with food, no food in the stomach.  He subsequently had another vomiting episode of large chunks of undigested food.  He was extubated on 5/14.  On 5/20 he underwent an EGD with botox injection and was noted to continue to have solid food remaining in esophagus.  Recommendations to continue further management as an outpatient following recovery from hospital course.  Hospital course complicated by multiple vomiting episodes affecting respiratory status, significant agitation and delirium requiring precedex  in  the ICU, and intolerance of tube feeds.  Therapy has been ongoing and pt has been recommended for CIR.    Glasgow Coma Scale Score: 14  Patient's medical record from [facility] has been reviewed by the rehabilitation admission coordinator and physician.  Past Medical History  Past Medical History:  Diagnosis Date   Allergy    Arthritis    BPH (benign prostatic hypertrophy)    Colonic polyp    GERD (gastroesophageal reflux disease)    Has LPR   Hypertension     Has the patient had major surgery during 100 days prior to admission? Yes  Family History  family history includes Hypertension in his mother.   Current Medications   Current Facility-Administered Medications:    acetaminophen  (TYLENOL ) tablet  650 mg, 650 mg, Per Tube, Q6H PRN, Rust-Chester, Jenni Mody L, NP, 650 mg at 10/13/23 2215   amLODipine  (NORVASC ) tablet 2.5 mg, 2.5 mg, Per Tube, Daily, Nelson, Dana G, NP, 2.5 mg at 10/17/23 1610   bisacodyl  (DULCOLAX) suppository 10 mg, 10 mg, Rectal, Daily PRN, Zhang, Dekui, MD, 10 mg at 10/12/23 0919   Chlorhexidine  Gluconate Cloth 2 % PADS 6 each, 6 each, Topical, Daily, Marnee Sink, MD, 6 each at 10/16/23 1000   docusate (COLACE) 50 MG/5ML liquid 100 mg, 100 mg, Per Tube, BID, Nelson, Dana G, NP, 100 mg at 10/17/23 0955   enoxaparin  (LOVENOX ) injection 50 mg, 50 mg, Subcutaneous, Q24H, Chappell, Alex B, RPH, 50 mg at 10/16/23 2052   feeding supplement (OSMOLITE 1.5 CAL) liquid 1,000 mL, 1,000 mL, Per Tube, Q24H, Elisabeth Guild, NP, 1,000 mL at 10/15/23 1745   feeding supplement (PROSource TF20) liquid 30 mL, 30 mL, Per Tube, Daily, Donaciano Frizzle, MD, 30 mL at 10/17/23 1001   free water  30 mL, 30 mL, Per Tube, Q4H, Kasa, Kurian, MD, 30 mL at 10/17/23 9604   haloperidol  lactate (HALDOL ) injection 2 mg, 2 mg, Intravenous, Q4H PRN, Zhang, Dekui, MD, 2 mg at 10/13/23 2352   hydrALAZINE  (APRESOLINE ) injection 10 mg, 10 mg, Intravenous, Q6H PRN, Marnee Sink, MD, 10 mg at 10/11/23 5409   insulin  aspart (novoLOG ) injection 0-9 Units, 0-9 Units, Subcutaneous, Q4H, Marnee Sink, MD, 1 Units at 10/17/23 8119   ipratropium-albuterol  (DUONEB) 0.5-2.5 (3) MG/3ML nebulizer solution 3 mL, 3 mL, Nebulization, Q4H PRN, Zhang, Dekui, MD   LORazepam  (ATIVAN ) tablet 0.5 mg, 0.5 mg, Per Tube, Q4H PRN, Mansy, Anastasio Kaska, MD   melatonin tablet 5 mg, 5 mg, Per Tube, QHS, Zhang, Dekui, MD, 5 mg at 10/16/23 2051   metoCLOPramide  (REGLAN ) injection 10 mg, 10 mg, Intravenous, Q6H, Zhang, Dekui, MD, 10 mg at 10/17/23 1478   ondansetron  (ZOFRAN ) tablet 4 mg, 4 mg, Oral, Q6H PRN **OR** ondansetron  (ZOFRAN ) injection 4 mg, 4 mg, Intravenous, Q6H PRN, Marnee Sink, MD, 4 mg at 10/05/23 1713   Oral care mouth rinse, 15 mL, Mouth  Rinse, PRN, Kasa, Kurian, MD, 15 mL at 10/12/23 1600   oxyCODONE -acetaminophen  (PERCOCET/ROXICET) 5-325 MG per tablet 1 tablet, 1 tablet, Per Tube, Q6H PRN, Donaciano Frizzle, MD   pantoprazole  (PROTONIX ) injection 40 mg, 40 mg, Intravenous, Q24H, Wohl, Darren, MD, 40 mg at 10/17/23 0955   polyethylene glycol (MIRALAX  / GLYCOLAX ) packet 17 g, 17 g, Per Tube, Daily PRN, Donaciano Frizzle, MD   QUEtiapine  (SEROQUEL ) tablet 25 mg, 25 mg, Per Tube, q AM, Zhang, Dekui, MD, 25 mg at 10/17/23 2956   QUEtiapine  (SEROQUEL ) tablet 50 mg, 50 mg, Per Tube, QHS, Kasa, Kurian, MD, 50 mg  at 10/16/23 2051   thiamine  (VITAMIN B1) tablet 100 mg, 100 mg, Per Tube, Daily, Zhang, Dekui, MD, 100 mg at 10/17/23 1610  Patients Current Diet:  Diet Order             Diet NPO time specified  Diet effective midnight                   Precautions / Restrictions Precautions Precautions: Fall Restrictions Weight Bearing Restrictions Per Provider Order: No   Has the patient had 2 or more falls or a fall with injury in the past year?Yes  Prior Activity Level Community (5-7x/wk): independent prior to admit, no DME, active in his church community, driving  Prior Functional Level Prior Function Prior Level of Function : Independent/Modified Independent, History of Falls (last six months) Mobility Comments: endorses falls hx, has fallen in garden and on front steps  Self Care: Did the patient need help bathing, dressing, using the toilet or eating?  Independent  Indoor Mobility: Did the patient need assistance with walking from room to room (with or without device)? Independent  Stairs: Did the patient need assistance with internal or external stairs (with or without device)? Independent  Functional Cognition: Did the patient need help planning regular tasks such as shopping or remembering to take medications? Independent  Patient Information Are you of Hispanic, Latino/a,or Spanish origin?: A. No, not of Hispanic,  Latino/a, or Spanish origin What is your race?: A. White Do you need or want an interpreter to communicate with a doctor or health care staff?: 0. No  Patient's Response To:  Health Literacy and Transportation Is the patient able to respond to health literacy and transportation needs?: Yes Health Literacy - How often do you need to have someone help you when you read instructions, pamphlets, or other written material from your doctor or pharmacy?: Never In the past 12 months, has lack of transportation kept you from medical appointments or from getting medications?: No In the past 12 months, has lack of transportation kept you from meetings, work, or from getting things needed for daily living?: No  Home Assistive Devices / Equipment Home Equipment: None  Prior Device Use: Indicate devices/aids used by the patient prior to current illness, exacerbation or injury? None of the above  Current Functional Level Cognition  Orientation Level: Oriented to person, Oriented to place, Disoriented to time, Disoriented to situation    Extremity Assessment (includes Sensation/Coordination)  Upper Extremity Assessment: Generalized weakness  Lower Extremity Assessment: Generalized weakness    ADLs  Overall ADL's : Needs assistance/impaired General ADL Comments: MIN A + HHA sit<>stand x12 trials, cues for eccentric control. MIN A x2 + RW for ADL t/f ~20 ft, +2 required for lines mgmt and redirecting pt as pt easily distracted by lines. CGA face washing in standing.    Mobility  Overal bed mobility: Needs Assistance Bed Mobility: Supine to Sit, Sit to Supine Rolling: Mod assist Supine to sit: Min assist General bed mobility comments: needs assist for B LE sequencing and scooting out towards EOB. Upright posture noted, however with pertabations, easily loses balance with mod assist required to regain balance    Transfers  Overall transfer level: Needs assistance Equipment used: Rolling walker (2  wheels) Transfers: Sit to/from Stand Sit to Stand: Min assist General transfer comment: needs cues for sequencing including hand placement. Takes several attempts for successful standing.    Ambulation / Gait / Stairs / Wheelchair Mobility  Ambulation/Gait Ambulation/Gait assistance: Holiday representative  Distance (Feet): 40 Feet Assistive device: Rolling walker (2 wheels) Gait Pattern/deviations: Step-to pattern General Gait Details: ambulated in room. Has difficulty with turns and staying too far away from AD. Needs cues for direction. Unable to dual task during gait and impulsive with stepping. Further distance limited due to transport arriving to take to procedure    Posture / Balance Balance Overall balance assessment: Needs assistance Sitting-balance support: No upper extremity supported, Feet supported Sitting balance-Leahy Scale: Fair Standing balance support: Single extremity supported, During functional activity Standing balance-Leahy Scale: Fair    Special needs/care consideration Skin tube feed, Diabetic management yes, and Behavioral consideration sundowning/delirium     Previous Home Environment (from acute therapy documentation) Living Arrangements: Alone Available Help at Discharge: Available 24 hours/day, Family Type of Home: House Home Layout: One level Home Access: Stairs to enter [employer] of Steps: 2 Bathroom Shower/Tub: Tub/shower unit Additional Comments: home setup per granddaughter in room. reports pt has girlfriend who could stay 24/7 or pt could go to her house.  Discharge Living Setting Plans for Discharge Living Setting: Patient's home, Lives with (comment) (s/o Ernistine) Type of Home at Discharge: Mobile home Discharge Home Layout: One level Discharge Home Access: Stairs to enter Entrance Stairs-Rails: None Entrance Stairs-Number of Steps: 2+1 Discharge Bathroom Shower/Tub: Tub/shower unit Discharge Bathroom Toilet: Standard Discharge  Bathroom Accessibility: Yes How Accessible: Accessible via walker (tight fit) Does the patient have any problems obtaining your medications?: No  Social/Family/Support Systems Anticipated Caregiver: Ernistine will be caregiver, granddaughter Danelle Dunning is Product manager and coordinating care Anticipated Caregiver's Contact Information: Danelle Dunning [phone] Ability/Limitations of Caregiver: Ernistine can provide supervision Caregiver Availability: 24/7 Discharge Plan Discussed with Primary Caregiver: Yes Is Caregiver In Agreement with Plan?: Yes Does Caregiver/Family have Issues with Lodging/Transportation while Pt is in Rehab?: No   Goals Patient/Family Goal for Rehab: PT/OT/SLP supervision Expected length of stay: 10-14 days Additional Information: Discharge plan: home either to pt's home or Ernistine's home.  He will have 24/7 from her at either location Pt/Family Agrees to Admission and willing to participate: Yes Program Orientation Provided & Reviewed with Pt/Caregiver Including Roles  & Responsibilities: Yes   Decrease burden of Care through IP rehab admission: n/a   Possible need for SNF placement upon discharge: not anticipated.  Plan for discharge with pt's s/o Ernistine providing 24/7 supervision.    Patient Condition: I have reviewed medical records from [facility], spoken with CM, and family member. I discussed via phone for inpatient rehabilitation assessment.  Patient will benefit from ongoing PT, OT, and SLP, can actively participate in 3 hours of therapy a day 5 days of the week, and can make measurable gains during the admission.  Patient will also benefit from the coordinated team approach during an Inpatient Acute Rehabilitation admission.  The patient will receive intensive therapy as well as Rehabilitation physician, nursing, social worker, and care management interventions.  Due to bladder management, bowel management, safety, skin/wound care, disease management, medication  administration, pain management, and patient education the patient requires 24 hour a day rehabilitation nursing.  The patient is currently min assist with mobility and basic ADLs.  Discharge setting and therapy post discharge at home with home health is anticipated.  Patient has agreed to participate in the Acute Inpatient Rehabilitation Program and will admit pending insurance approval ***.  Preadmission Screen Completed By:  Mickey Alar, PT, PT, DPT 10/17/2023 10:38 AM ______________________________________________________________________   Discussed status with Dr. Aaron Aason***at *** and received approval for admission today.  Admission Coordinator:  Robley Matassa E Ikeem Cleckler, time***/Date***

## 2023-10-17 NOTE — Progress Notes (Signed)
 Palliative Care Progress Note, Assessment & Plan   Patient Name: Robert Vega       Date: 10/17/2023 DOB: 1940/07/17  Age: 83 y.o. MRN#: [id number] Attending Physician: Verla Glaze, MD Primary Care Physician: Helaine Llanos, MD Admit Date: 10/05/2023  Subjective: Patient is sitting up in bed in no apparent distress.  He acknowledges my presence and is able to make his wishes known.  His cousin and cousin's wife are at bedside with NT during my visit.  HPI: 83 y.o. male  with past medical history of dysphagia (s/p EGD 03/2022 with food impaction in upper esophagus-suffered aspiration and cardiac arrest--concern for lack or peristalsis, OSA and HTN admitted from home on 10/05/2023 with hypoxia, fever and generalized weakness.   Admitted initially to [facility] service, unfortunately overnight suffered likely aspiration event of vomitus--transferred to ICU and required mechanical ventilation   5/11: bronch 5/13: PEG placed, patient failed wean trial 5/14: vomiting X 2 overnight, successfully extubated to RA 5/15: AM agitation, remains confused but pleasant 5/19: Gdtr at bedside, in agreement with GI recs for botox injection and possible POEM at tertiary center   Palliative medicine was consulted for assisting with goals of care conversations.  Summary of counseling/coordination of care: Extensive chart review completed prior to meeting patient including labs, vital signs, imaging, progress notes, orders, and available advanced directive documents from current and previous encounters.   After reviewing the patient's chart and assessing the patient at bedside, I spoke with patient in regards to symptom management and goals of care.   Symptoms assessed.  Patient denies headache, chest pain, abdominal  discomfort, nausea/vomiting, or other acute ailments at this time.  No adjustment to [facility] needed.  Attempted to gauge patient's understanding of his current medical situation.  He began discussing how he would drink a sip of Pepsi to clear his throat when he was younger.  He is requesting that just a sip of Pepsi would help him feel better.  I again highlighted significance of n.p.o. status, esophageal dysmotility, and risk for aspiration.  Patient again requested a sip of Pepsi.  Though he was polite, he remains confused about his current medical situation.  Patient's granddaughter is patient's HCPOA.  She continues to endorse full code and full scope.  Plan is for patient to go to CIR if approved.  Goals are clear.  TOC following closely for discharge planning.  Symptom burden is low.  No acute palliative needs at this time.  PMT will step back from daily visits.  Please reengage with PMT if goals change, at patient/family's request, or if patient's health deteriorates during hospitalization.  Physical Exam Vitals reviewed.  Constitutional:      General: He is not in acute distress.    Appearance: He is obese.  HENT:     Head: Normocephalic.     Nose: Nose normal.     Mouth/Throat:     Mouth: Mucous membranes are moist.  Eyes:     Pupils: Pupils are equal, round, and reactive to light.  Pulmonary:     Effort: Pulmonary effort is normal.  Abdominal:     Palpations: Abdomen is soft.  Skin:    General: Skin is warm  and dry.  Neurological:     Mental Status: He is alert.     Comments: Oriented to self and place  Psychiatric:        Behavior: Behavior normal.             Total Time 35 minutes   Time spent includes: Detailed review of medical records (labs, imaging, vital signs), medically appropriate exam (mental status, respiratory, cardiac, skin), discussed with treatment team, counseling and educating patient, family and staff, documenting clinical information, medication  management and coordination of care.  Judeen Nose L. Rebbeca Campi, DNP, FNP-BC Palliative Medicine Team

## 2023-10-17 NOTE — Progress Notes (Signed)
 Progress Note   Patient: Robert Vega ZOX:[id number] DOB: 05/26/1941 DOA: 10/05/2023     12 DOS: the patient was seen and examined on 10/17/2023   Brief hospital course: 83 y.o male with significant PMH of OSA, GERD, Obesity, HTN, Dysphagia: EGD 03/2022 with food in upper esophagus complicated by aspiration event, cardiac arrest with round of CPR, and post resuscitation EGD with concern for lack of peristalsis. Prior EGD 08/2020 with note of abnormal cricopharyngeus, decrease in motility in esophagus, and spastic LES who presented to the ED from with hypoxia, fever and generalized weakness.  Patient developed acute respiratory failure requiring intubation due to aspiration pneumonia, he was treated with broad-spectrum antibiotics and managed in ICU. Condition had improved, extubated 5/15, but has significant agitation required brief course of Precedex . 5/10: Admit to [facility] service with sepsis due to Aspiration Pneumonia.  Course complicated by Acute Respiratory Failure due to Aspiration of vomitus requiring s/p cardiac arrest  transfer to ICU and intubation.  5/11 flexible bronchoscopy done by critical care team 5/12 remains intubated 5/13 remains on vent, s/p PEG tube, +vomiting last night, failed SAT/SBT 5/14 extubated successfully but with severe delirium 5/20.  Botulinum toxin injection into the lower esophageal sphincter by Dr. Ole Berkeley  5/21 esophagram showing sepsis diffusely distended with distal obstruction and severe dysmotility suggestive of achalasia. 5/22.  Advised I would not give him any food continue PEG feeding he can try liquids to see if that goes down but still high risk for aspiration.  Assessment and Plan: * Dysphagia Likely secondary to achalasia.  Patient has a PEG tube placement.  Patient had Botox injection of the lower esophageal sphincter due to lower esophageal sphincter hypertension causing the patient not being able to swallow and empty the esophagus of his ingested  food.  Food was in the esophagus on the previous EEG.  With barium swallow study I do not want to give him any food.  Patient wants to try liquids and granddaughter okay with this.  Still high risk for aspiration.   Achalasia of esophagus Status post botulinum toxin injection on 5/20.  Esophagram yesterday showing dilated esophagus distal obstruction.  Acute respiratory failure with hypoxia and hypercapnia (HCC) Pneumonia Patient now on room air  Septic shock (HCC) Present on admission with acute hypoxic respiratory failure, pneumonia bilateral lower lobes.  Patient completed antibiotics.  GERD (gastroesophageal reflux disease) PPI   Essential hypertension, benign Continue Norvasc   Obesity (BMI 30-39.9) BMI 32.7  Acute delirium Patient on Seroquel  twice a day.  Get rid of IV Ativan .        Subjective: Patient feels okay wanting to eat.  Patient's daughter okay with trying liquids and seeing how that does.  Physical Exam: Vitals:   10/16/23 1547 10/16/23 2020 10/17/23 0429 10/17/23 0732  BP: 138/76 105/68 (!) 146/79 (!) 140/72  Pulse: 76 86 71 73  Resp: 18 20 20 18   Temp: 98.7 F (37.1 C) (!) 97.3 F (36.3 C) 97.6 F (36.4 C) 98.6 F (37 C)  TempSrc:      SpO2: 94% 97% 94% 92%  Weight:   100.5 kg   Height:       Physical Exam HENT:     Head: Normocephalic.     Mouth/Throat:     Pharynx: No oropharyngeal exudate.  Eyes:     General: Lids are normal.     Conjunctiva/sclera: Conjunctivae normal.  Cardiovascular:     Rate and Rhythm: Normal rate and regular rhythm.  Heart sounds: Normal heart sounds, S1 normal and S2 normal.  Pulmonary:     Breath sounds: Examination of the right-lower field reveals decreased breath sounds. Examination of the left-lower field reveals decreased breath sounds. Decreased breath sounds present. No wheezing, rhonchi or rales.  Abdominal:     Palpations: Abdomen is soft.     Tenderness: There is no abdominal tenderness.   Musculoskeletal:     Right lower leg: No swelling.     Left lower leg: No swelling.  Skin:    General: Skin is warm.     Findings: No rash.  Neurological:     Mental Status: He is alert and oriented to person, place, and time.     Data Reviewed: White blood cell count 10.7, hemoglobin 11.4, platelet count 210  Family Communication: Spoke with granddaughter on the phone  Disposition: Status is: Inpatient Remains inpatient appropriate because: Needs insurance authorization for acute inpatient rehab.  Planned Discharge Destination: Acute inpatient rehab    Time spent: 28 minutes  Author: Verla Glaze, MD 10/17/2023 2:12 PM  For on call review [email or web address].

## 2023-10-17 NOTE — Progress Notes (Addendum)
 Coming into the room to assess my patient, was told by sitter that pt had an assisted fall around 1915. Pt is a 1:1 sitter. Per sitter, pt got up on his own and walked to the bathroom, pt legs gave up and he was assisted to the floor. Pt then got up on his own. Charge nurse yasmin at bedside. Pt denied hurting himself. Vital signs WNL. Supervisor Trevor Fudge, provider Brion Cancel and power of attorney Galantine made aware. Pt and sitter educated and verbalized understanding. Per provider, will continue fall precautions and monitor for changes.

## 2023-10-17 NOTE — Progress Notes (Signed)
 Pt agitated, restless, combative, pulling out IV, not following commands. Haldol  given and effective. Will continue to  monitor.

## 2023-10-18 DIAGNOSIS — I10 Essential (primary) hypertension: Secondary | ICD-10-CM | POA: Diagnosis not present

## 2023-10-18 DIAGNOSIS — K2100 Gastro-esophageal reflux disease with esophagitis, without bleeding: Secondary | ICD-10-CM | POA: Diagnosis not present

## 2023-10-18 DIAGNOSIS — K220 Achalasia of cardia: Secondary | ICD-10-CM | POA: Diagnosis not present

## 2023-10-18 DIAGNOSIS — K219 Gastro-esophageal reflux disease without esophagitis: Secondary | ICD-10-CM | POA: Diagnosis not present

## 2023-10-18 DIAGNOSIS — A419 Sepsis, unspecified organism: Secondary | ICD-10-CM | POA: Diagnosis not present

## 2023-10-18 DIAGNOSIS — R410 Disorientation, unspecified: Secondary | ICD-10-CM | POA: Diagnosis not present

## 2023-10-18 DIAGNOSIS — R1319 Other dysphagia: Secondary | ICD-10-CM | POA: Diagnosis not present

## 2023-10-18 DIAGNOSIS — E669 Obesity, unspecified: Secondary | ICD-10-CM | POA: Diagnosis not present

## 2023-10-18 DIAGNOSIS — J9601 Acute respiratory failure with hypoxia: Secondary | ICD-10-CM | POA: Diagnosis not present

## 2023-10-18 LAB — CBC
HCT: 32.1 % — ABNORMAL LOW (ref 39.0–52.0)
Hemoglobin: 10.8 g/dL — ABNORMAL LOW (ref 13.0–17.0)
MCH: 32.9 pg (ref 26.0–34.0)
MCHC: 33.6 g/dL (ref 30.0–36.0)
MCV: 97.9 fL (ref 80.0–100.0)
Platelets: 212 10*3/uL (ref 150–400)
RBC: 3.28 MIL/uL — ABNORMAL LOW (ref 4.22–5.81)
RDW: 13 % (ref 11.5–15.5)
WBC: 10.5 10*3/uL (ref 4.0–10.5)
nRBC: 0 % (ref 0.0–0.2)

## 2023-10-18 LAB — GLUCOSE, CAPILLARY
Glucose-Capillary: 101 mg/dL — ABNORMAL HIGH (ref 70–99)
Glucose-Capillary: 110 mg/dL — ABNORMAL HIGH (ref 70–99)
Glucose-Capillary: 137 mg/dL — ABNORMAL HIGH (ref 70–99)
Glucose-Capillary: 140 mg/dL — ABNORMAL HIGH (ref 70–99)
Glucose-Capillary: 94 mg/dL (ref 70–99)
Glucose-Capillary: 98 mg/dL (ref 70–99)
Glucose-Capillary: 98 mg/dL (ref 70–99)

## 2023-10-18 LAB — BASIC METABOLIC PANEL WITH GFR
Anion gap: 10 (ref 5–15)
BUN: 32 mg/dL — ABNORMAL HIGH (ref 8–23)
CO2: 24 mmol/L (ref 22–32)
Calcium: 8.2 mg/dL — ABNORMAL LOW (ref 8.9–10.3)
Chloride: 103 mmol/L (ref 98–111)
Creatinine, Ser: 1.21 mg/dL (ref 0.61–1.24)
GFR, Estimated: 59 mL/min — ABNORMAL LOW (ref >60)
Glucose, Bld: 106 mg/dL — ABNORMAL HIGH (ref 70–99)
Potassium: 3.8 mmol/L (ref 3.5–5.1)
Sodium: 137 mmol/L (ref 135–145)

## 2023-10-18 MED ORDER — QUETIAPINE FUMARATE 25 MG PO TABS: 25.0000 mg | ORAL_TABLET | Freq: Once | ORAL | Status: DC

## 2023-10-18 MED ORDER — LORAZEPAM 0.5 MG PO TABS: 0.5000 mg | ORAL_TABLET | Freq: Three times a day (TID) | ORAL | Status: DC | PRN

## 2023-10-18 NOTE — Progress Notes (Signed)
 Physical Therapy Treatment Patient Details Name: Robert Vega MRN: [id number] DOB: [birth year] Today's Date: 10/18/2023   History of Present Illness 83 y.o male with significant PMH of OSA, GERD, Obesity, HTN, Dysphagia: EGD 03/2022 with food in upper esophagus complicated by aspiration event, cardiac arrest with round of CPR, and post resuscitation EGD with concern for lack of peristalsis. Pt presented to ED on 10/05/2023 with hypoxia, fever and generalized weakness; developed acute respiratory failure requiring intubation 5/10 due to aspiration pneumonia, extubated 5/15, but had significant agitation required brief course of Precedex .    PT Comments  Pt was sitting in recliner upon arrival. He is A and O x 2. Agreeable to session and remains pleasant and cooperative throughout. Feeding held during session and returned post session (HOB >30 degrees). Pt was able to stand and tolerate ambulation   ~ 200 ft with HHA +1. Unsteadiness/ staggering noted when author encouraged pt to lift head and erect/ correct posture. Overall he tolerated ambulation well. He did need extensive assistance after ambulation to return to bed and reposition towards HOB. Acute PT will continue to follow and progress per current POC.    If plan is discharge home, recommend the following: A little help with walking and/or transfers;A lot of help with bathing/dressing/bathroom;Assistance with cooking/housework;Direct supervision/assist for medications management;Direct supervision/assist for financial management;Assist for transportation;Help with stairs or ramp for entrance;Supervision due to cognitive status     Equipment Recommendations  Other (comment) (Defer to next level of care)       Precautions / Restrictions Precautions Precautions: Fall Recall of Precautions/Restrictions: Impaired Restrictions Weight Bearing Restrictions Per Provider Order: No     Mobility  Bed Mobility Overal bed mobility: Needs  Assistance Bed Mobility: Sit to Supine  Sit to supine: Min assist, Mod assist   Transfers Overall transfer level: Needs assistance Equipment used: Rolling walker (2 wheels) Transfers: Sit to/from Stand Sit to Stand: Min assist   Ambulation/Gait Ambulation/Gait assistance: Contact guard assist, Min assist Gait Distance (Feet): 160 Feet Assistive device: 1 person hand held assist Gait Pattern/deviations: Step-through pattern Gait velocity: decreased  General Gait Details: Constant vcs for posture correction. pt does correct but unable to maintain. Occasional staggering when lifting his head to neutral however no intervention required   Balance Overall balance assessment: Needs assistance Sitting-balance support: No upper extremity supported, Feet supported Sitting balance-Leahy Scale: Good     Standing balance support: Single extremity supported, During functional activity Standing balance-Leahy Scale: Fair     Hotel manager: Impaired Factors Affecting Communication: Reduced clarity of speech  Cognition Arousal: Alert Behavior During Therapy: WFL for tasks assessed/performed   PT - Cognitive impairments: No family/caregiver present to determine baseline   Orientation impairments: Situation, Time    Following commands: Intact Following commands impaired: Follows one step commands with increased time    Cueing Cueing Techniques: Verbal cues, Tactile cues         Pertinent Vitals/Pain Pain Assessment Pain Assessment: No/denies pain     PT Goals (current goals can now be found in the care plan section) Acute Rehab PT Goals Patient Stated Goal: Go home to my dog Progress towards PT goals: Progressing toward goals    Frequency    Min 3X/week           Co-evaluation     PT goals addressed during session: Mobility/safety with mobility;Balance;Strengthening/ROM;Proper use of DME        AM-PAC PT "6 Clicks" Mobility   Outcome  Measure  Help  needed turning from your back to your side while in a flat bed without using bedrails?: A Little Help needed moving from lying on your back to sitting on the side of a flat bed without using bedrails?: A Little Help needed moving to and from a bed to a chair (including a wheelchair)?: A Little Help needed standing up from a chair using your arms (e.g., wheelchair or bedside chair)?: A Little Help needed to walk in hospital room?: A Little Help needed climbing 3-5 steps with a railing? : A Lot 6 Click Score: 17    End of Session   Activity Tolerance: Patient tolerated treatment well Patient left: in bed;with call bell/phone within reach;with bed alarm set (sitter was discontinued) Nurse Communication: Mobility status PT Visit Diagnosis: Unsteadiness on feet (R26.81);Repeated falls (R29.6);Muscle weakness (generalized) (M62.81);History of falling (Z91.81);Difficulty in walking, not elsewhere classified (R26.2)     Time: 0272-5366 PT Time Calculation (min) (ACUTE ONLY): 17 min  Charges:    $Gait Training: 8-22 mins PT General Charges $$ ACUTE PT VISIT: 1 Visit                    Chester Costa PTA 10/18/23, 4:32 PM

## 2023-10-18 NOTE — TOC Progression Note (Signed)
 Transition of Care [facility]) - Progression Note    Patient Details  Name: Robert Vega MRN: [id number] Date of Birth: [birth year]  Transition of Care [facility]) CM/SW Contact  Alexandra Ice, RN Phone Number: 10/18/2023, 12:53 PM  Clinical Narrative:     Received message insurance denied CIR, family wanting to appeal. TOC to follow.        Expected Discharge Plan and Services                                               Social Determinants of Health (SDOH) Interventions SDOH Screenings   Food Insecurity: Patient Unable To Answer (10/06/2023)  Recent Concern: Food Insecurity - Food Insecurity Present (09/11/2023)   Received from [facility]  Housing: Patient Unable To Answer (10/06/2023)  Recent Concern: Housing - High Risk (09/11/2023)   Received from [facility]  Transportation Needs: Patient Unable To Answer (10/06/2023)  Utilities: Patient Unable To Answer (10/06/2023)  Depression (PHQ2-9): Low Risk  (05/07/2023)  Financial Resource Strain: Medium Risk (09/11/2023)   Received from [facility]  Social Connections: Unknown (10/06/2023)  Tobacco Use: Medium Risk (10/15/2023)    Readmission Risk Interventions     No data to display

## 2023-10-18 NOTE — Progress Notes (Signed)
 Progress Note   Patient: Robert Vega ZOX:[id number] DOB: 1940-11-01 DOA: 10/05/2023     13 DOS: the patient was seen and examined on 10/18/2023   Brief hospital course: 83 y.o male with significant PMH of OSA, GERD, Obesity, HTN, Dysphagia: EGD 03/2022 with food in upper esophagus complicated by aspiration event, cardiac arrest with round of CPR, and post resuscitation EGD with concern for lack of peristalsis. Prior EGD 08/2020 with note of abnormal cricopharyngeus, decrease in motility in esophagus, and spastic LES who presented to the ED from with hypoxia, fever and generalized weakness.  Patient developed acute respiratory failure requiring intubation due to aspiration pneumonia, he was treated with broad-spectrum antibiotics and managed in ICU. Condition had improved, extubated 5/15, but has significant agitation required brief course of Precedex . 5/10: Admit to [facility] service with sepsis due to Aspiration Pneumonia.  Course complicated by Acute Respiratory Failure due to Aspiration of vomitus requiring s/p cardiac arrest  transfer to ICU and intubation.  5/11 flexible bronchoscopy done by critical care team 5/12 remains intubated 5/13 remains on vent, s/p PEG tube, +vomiting last night, failed SAT/SBT 5/14 extubated successfully but with severe delirium 5/20.  Botulinum toxin injection into the lower esophageal sphincter by Dr. Ole Berkeley  5/21 esophagram showing diffusely distended esophagus with distal obstruction and severe dysmotility suggestive of achalasia. 5/22.  Advised I would not give him any food and will continue PEG feeding he can try liquids to see if that goes down but still high risk for aspiration. 5/23.  Patient feels okay.  Awaken from sleep.  Did not offer any complaints.  Awaiting insurance authorization for rehab.  Assessment and Plan: * Dysphagia Likely secondary to achalasia.  Patient has a PEG tube placement.  Patient had Botox injection of the lower esophageal sphincter  due to lower esophageal sphincter hypertension causing the patient not being able to swallow and empty the esophagus of his ingested food.  Food was in the esophagus on the previous EGD.  With barium swallow study results, I do not want to give him any food.  Patient wants to try liquids and is still at a higher risk of aspiration   Achalasia of esophagus Status post botulinum toxin injection on 5/20.  Esophagram showing dilated esophagus distal obstruction.  Acute respiratory failure with hypoxia and hypercapnia (HCC) Pneumonia Patient now on room air  Septic shock (HCC) Present on admission with acute hypoxic respiratory failure, pneumonia bilateral lower lobes.  Patient completed antibiotics.  GERD (gastroesophageal reflux disease) PPI   Essential hypertension, benign Continue Norvasc   Obesity (BMI 30-39.9) BMI 32.7  Acute delirium Patient on Seroquel  twice a day.  Get rid of IV Ativan .        Subjective: Patient awakened from sleep.  Felt okay.  Stated he felt a little sleepy.  Offers no complaints.  Initially admitted with acute respiratory failure and pneumonia.  Patient had an assisted fall last night while walking to the bathroom with the sitter.  Physical Exam: Vitals:   10/18/23 0106 10/18/23 0413 10/18/23 0701 10/18/23 0732  BP: 105/71 136/79  115/62  Pulse: 73 77  72  Resp: 18 19  16   Temp: 97.9 F (36.6 C)   98.5 F (36.9 C)  TempSrc:      SpO2: 96% 96%  93%  Weight:   99.2 kg   Height:       Physical Exam HENT:     Head: Normocephalic.     Mouth/Throat:     Pharynx: No  oropharyngeal exudate.  Eyes:     General: Lids are normal.     Conjunctiva/sclera: Conjunctivae normal.  Cardiovascular:     Rate and Rhythm: Normal rate and regular rhythm.     Heart sounds: Normal heart sounds, S1 normal and S2 normal.  Pulmonary:     Breath sounds: Examination of the right-lower field reveals decreased breath sounds. Examination of the left-lower field  reveals decreased breath sounds. Decreased breath sounds present. No wheezing, rhonchi or rales.  Abdominal:     Palpations: Abdomen is soft.     Tenderness: There is no abdominal tenderness.  Musculoskeletal:     Right lower leg: No swelling.     Left lower leg: No swelling.  Skin:    General: Skin is warm.     Findings: No rash.  Neurological:     Mental Status: He is alert and oriented to person, place, and time.     Data Reviewed: Creatinine 1.21, white blood cell count 10.5, hemoglobin 10.8, platelet count 212  Family Communication: Spoke with granddaughter on the phone  Disposition: Status is: Inpatient Remains inpatient appropriate because: Awaiting insurance authorization for acute inpatient rehab  Planned Discharge Destination: Acute inpatient rehab    Time spent: 28 minutes  Author: Verla Glaze, MD 10/18/2023 11:17 AM  For on call review [email or web address].

## 2023-10-18 NOTE — Progress Notes (Signed)
 Occupational Therapy Treatment Patient Details Name: Robert Vega MRN: [id number] DOB: 02-15-1941 Today's Date: 10/18/2023   History of present illness 83 y.o male with significant PMH of OSA, GERD, Obesity, HTN, Dysphagia: EGD 03/2022 with food in upper esophagus complicated by aspiration event, cardiac arrest with round of CPR, and post resuscitation EGD with concern for lack of peristalsis. Pt presented to ED on 10/05/2023 with hypoxia, fever and generalized weakness; developed acute respiratory failure requiring intubation 5/10 due to aspiration pneumonia, extubated 5/15, but had significant agitation required brief course of Precedex .   OT comments  Robert Vega was seen for OT treatment on this date. Upon arrival to room pt in bed with sitter at bedside, pt pleasant and agreeable to tx. Initially pt oriented to name/year only - unable to state birth date accurately; however upon completion of mobility pt demonstrated improved cognition - accurately stating city, name of hospital, month/year and day of the week. Noted tangential speech t/o session with increased difficulty attending to task compared to prior session - suspect r/t medications.   Pt requires MOD A exit bed, poor sitting balance requiring MAX A don B socks in sitting 2/2 heavy posterior lean with dynamic activity. Improves to CGA with static sitting and time. MIN A + RW for ADL t/f ~50 ft, assist to manage RW. MIN A functional reaching task to open blinds, poor balance without UE support. Pt making good progress toward goals, will continue to follow POC. Discharge recommendation remains appropriate.       If plan is discharge home, recommend the following:  Assistance with cooking/housework;Help with stairs or ramp for entrance;Supervision due to cognitive status;A lot of help with walking and/or transfers;A lot of help with bathing/dressing/bathroom   Equipment Recommendations  Other (comment) (defer)    Recommendations for  Other Services      Precautions / Restrictions Precautions Precautions: Fall Recall of Precautions/Restrictions: Impaired Restrictions Weight Bearing Restrictions Per Provider Order: No       Mobility Bed Mobility Overal bed mobility: Needs Assistance Bed Mobility: Supine to Sit     Supine to sit: Mod assist          Transfers Overall transfer level: Needs assistance Equipment used: Rolling walker (2 wheels) Transfers: Sit to/from Stand Sit to Stand: Min assist                 Balance Overall balance assessment: Needs assistance Sitting-balance support: No upper extremity supported, Feet supported Sitting balance-Leahy Scale: Good     Standing balance support: Single extremity supported, During functional activity Standing balance-Leahy Scale: Fair                             ADL either performed or assessed with clinical judgement   ADL Overall ADL's : Needs assistance/impaired                                       General ADL Comments: MAX A don B socks in sitting, heavy posterior lean with dynamic activity. MIN A + RW for ADL t/f ~50 ft, assist to manage RW. MIN A functional reaching task to open blinds, poor balance without UE support.    Extremity/Trunk Assessment              Vision       Perception     Praxis  Communication Communication Communication: Impaired Factors Affecting Communication: Reduced clarity of speech   Cognition Arousal: Alert Behavior During Therapy: Restless Cognition: Cognition impaired   Orientation impairments: Place, Situation                           Following commands: Impaired Following commands impaired: Follows one step commands inconsistently      Cueing   Cueing Techniques: Verbal cues, Tactile cues, Visual cues  Exercises      Shoulder Instructions       General Comments      Pertinent Vitals/ Pain       Pain Assessment Pain Assessment:  No/denies pain  Home Living                                          Prior Functioning/Environment              Frequency  Min 3X/week        Progress Toward Goals  OT Goals(current goals can now be found in the care plan section)  Progress towards OT goals: Progressing toward goals  Acute Rehab OT Goals OT Goal Formulation: With patient/family Time For Goal Achievement: 10/25/23 Potential to Achieve Goals: Good ADL Goals Pt Will Perform Grooming: with supervision;sitting Pt Will Perform Lower Body Dressing: with min assist;sit to/from stand Pt Will Transfer to Toilet: with min assist;ambulating Pt Will Perform Toileting - Clothing Manipulation and hygiene: with min assist;sit to/from stand  Plan      Co-evaluation                 AM-PAC OT "6 Clicks" Daily Activity     Outcome Measure   Help from another person eating meals?: None Help from another person taking care of personal grooming?: A Little Help from another person toileting, which includes using toliet, bedpan, or urinal?: A Lot Help from another person bathing (including washing, rinsing, drying)?: A Lot Help from another person to put on and taking off regular upper body clothing?: A Little Help from another person to put on and taking off regular lower body clothing?: A Lot 6 Click Score: 16    End of Session Equipment Utilized During Treatment: Gait belt;Rolling walker (2 wheels)  OT Visit Diagnosis: Unsteadiness on feet (R26.81);Repeated falls (R29.6);Muscle weakness (generalized) (M62.81)   Activity Tolerance Patient tolerated treatment well   Patient Left in chair;with call bell/phone within reach;with nursing/sitter in room   Nurse Communication Mobility status        Time: 7829-5621 OT Time Calculation (min): 19 min  Charges: OT General Charges $OT Visit: 1 Visit OT Treatments $Therapeutic Activity: 8-22 mins  Robert Vega, M.S. OTR/L  10/18/23, 2:11 PM   ascom [phone]

## 2023-10-18 NOTE — Progress Notes (Signed)
 [facility] IP rehab admissions - We have received a denial from insurance carrier for acute inpatient rehab admission.  I spoke with Brittny, patient's grand daughter, POA.  She wishes to appeal the decision.  We will file the appeal today with [facility] Medicare to request acute inpatient rehab admission.  [phone]

## 2023-10-19 DIAGNOSIS — K220 Achalasia of cardia: Secondary | ICD-10-CM | POA: Diagnosis not present

## 2023-10-19 DIAGNOSIS — R531 Weakness: Secondary | ICD-10-CM

## 2023-10-19 DIAGNOSIS — I10 Essential (primary) hypertension: Secondary | ICD-10-CM | POA: Diagnosis not present

## 2023-10-19 DIAGNOSIS — A419 Sepsis, unspecified organism: Secondary | ICD-10-CM | POA: Diagnosis not present

## 2023-10-19 DIAGNOSIS — R410 Disorientation, unspecified: Secondary | ICD-10-CM | POA: Diagnosis not present

## 2023-10-19 DIAGNOSIS — K2100 Gastro-esophageal reflux disease with esophagitis, without bleeding: Secondary | ICD-10-CM | POA: Diagnosis not present

## 2023-10-19 DIAGNOSIS — R1319 Other dysphagia: Secondary | ICD-10-CM | POA: Diagnosis not present

## 2023-10-19 DIAGNOSIS — J9601 Acute respiratory failure with hypoxia: Secondary | ICD-10-CM | POA: Diagnosis not present

## 2023-10-19 DIAGNOSIS — K219 Gastro-esophageal reflux disease without esophagitis: Secondary | ICD-10-CM | POA: Diagnosis not present

## 2023-10-19 DIAGNOSIS — E669 Obesity, unspecified: Secondary | ICD-10-CM | POA: Diagnosis not present

## 2023-10-19 LAB — GLUCOSE, CAPILLARY
Glucose-Capillary: 100 mg/dL — ABNORMAL HIGH (ref 70–99)
Glucose-Capillary: 100 mg/dL — ABNORMAL HIGH (ref 70–99)
Glucose-Capillary: 114 mg/dL — ABNORMAL HIGH (ref 70–99)
Glucose-Capillary: 122 mg/dL — ABNORMAL HIGH (ref 70–99)
Glucose-Capillary: 114 mg/dL — ABNORMAL HIGH (ref 70–99)

## 2023-10-19 NOTE — Progress Notes (Signed)
 Progress Note   Patient: Robert Vega DOB: Dec 24, 1940 DOA: 10/05/2023     14 DOS: the patient was seen and examined on 10/19/2023   Brief hospital course: 83 y.o male with significant PMH of OSA, GERD, Obesity, HTN, Dysphagia: EGD 03/2022 with food in upper esophagus complicated by aspiration event, cardiac arrest with round of CPR, and post resuscitation EGD with concern for lack of peristalsis. Prior EGD 08/2020 with note of abnormal cricopharyngeus, decrease in motility in esophagus, and spastic LES who presented to the ED from with hypoxia, fever and generalized weakness.  Patient developed acute respiratory failure requiring intubation due to aspiration pneumonia, he was treated with broad-spectrum antibiotics and managed in ICU. Condition had improved, extubated 5/15, but has significant agitation required brief course of Precedex . 5/10: Admit to [facility] service with sepsis due to Aspiration Pneumonia.  Course complicated by Acute Respiratory Failure due to Aspiration of vomitus requiring s/p cardiac arrest  transfer to ICU and intubation.  5/11 flexible bronchoscopy done by critical care team 5/12 remains intubated 5/13 remains on vent, s/p PEG tube, +vomiting last night, failed SAT/SBT 5/14 extubated successfully but with severe delirium 5/20.  Botulinum toxin injection into the lower esophageal sphincter by Dr. Ole Berkeley  5/21 esophagram showing diffusely distended esophagus with distal obstruction and severe dysmotility suggestive of achalasia. 5/22.  Advised I would not give him any food and will continue PEG feeding he can try liquids to see if that goes down but still high risk for aspiration.  Patient had a fall on the evening of 5/22. 5/23.  Patient feels okay.  Awaken from sleep.  Did not offer any complaints.  Awaiting insurance authorization for rehab. 5/24.  Patient feeling okay.  States he is doing okay with the liquid diet.  Still feels weak.  Assessment and Plan: *  Dysphagia Likely secondary to achalasia.  Patient has a PEG tube placement.  Patient had Botox injection of the lower esophageal sphincter due to lower esophageal sphincter hypertension causing the patient not being able to swallow and empty the esophagus of his ingested food.  Food was in the esophagus on the previous EGD.  With barium swallow study results, I do not want to give him any food.  Patient wants to continue liquids and is still at a higher risk of aspiration   Achalasia of esophagus Status post botulinum toxin injection on 5/20.  Esophagram showing dilated esophagus distal obstruction.  Acute respiratory failure with hypoxia and hypercapnia (HCC) Pneumonia Patient now on room air  Septic shock (HCC) Present on admission with acute hypoxic respiratory failure, pneumonia bilateral lower lobes.  Patient completed antibiotics.  GERD (gastroesophageal reflux disease) PPI   Essential hypertension, benign Continue Norvasc   Generalized weakness Patient had a fall the other night.  Patient still needs hand-held assistance with 1 person in order to ambulate.  Patient is unsteady and staggers with the physical therapy team.  Obesity (BMI 30-39.9) BMI 33.16  Acute delirium Continue Seroquel  at night.  Got rid of morning dose.  Got rid of Ativan .  Likely sundown's in the late afternoon early evening.        Subjective: Patient feeling okay.  Advised he has to be careful when sitting up when he does drink.  Still at high risk of aspiration.  Initially admitted with sepsis.  Found to have achalasia.  Still requiring assistance with walking.  Had a fall with walking with an aide.  Physical Exam: Vitals:   10/19/23 0101 10/19/23 0500  10/19/23 0506 10/19/23 0732  BP:   120/63 (!) 162/79  Pulse: 78  66 72  Resp:   16 16  Temp:   97.8 F (36.6 C) (!) 97.5 F (36.4 C)  TempSrc:   Oral Oral  SpO2: 100%  94% 96%  Weight:  101.9 kg    Height:       Physical Exam HENT:      Head: Normocephalic.     Mouth/Throat:     Pharynx: No oropharyngeal exudate.  Eyes:     General: Lids are normal.     Conjunctiva/sclera: Conjunctivae normal.  Cardiovascular:     Rate and Rhythm: Normal rate and regular rhythm.     Heart sounds: Normal heart sounds, S1 normal and S2 normal.  Pulmonary:     Breath sounds: Examination of the right-lower field reveals decreased breath sounds. Examination of the left-lower field reveals decreased breath sounds. Decreased breath sounds present. No wheezing, rhonchi or rales.  Abdominal:     Palpations: Abdomen is soft.     Tenderness: There is no abdominal tenderness.  Musculoskeletal:     Right lower leg: No swelling.     Left lower leg: No swelling.  Skin:    General: Skin is warm.     Findings: No rash.  Neurological:     Mental Status: He is alert.     Data Reviewed: Last creatinine 1.21, last hemoglobin 10.8  Family Communication: Spoke with granddaughter on the phone  Disposition: Status is: Inpatient Remains inpatient appropriate because: Insurance company rejected acute inpatient rehab.  Family is appealing.  Awaiting appeal process.  Planned Discharge Destination: Possible acute inpatient rehab.  If appeal is rejected we will have to look into subacute rehab.    Time spent: 28 minutes  Author: Verla Glaze, MD 10/19/2023 11:00 AM  For on call review [email or web address].

## 2023-10-19 NOTE — Assessment & Plan Note (Signed)
 Patient had a fall the other night.  Patient still needs hand-held assistance with 1 person in order to ambulate.  Patient is unsteady and staggers with the physical therapy team.

## 2023-10-19 NOTE — Plan of Care (Signed)
  Problem: Education: Goal: Knowledge of General Education information will improve Description: Including pain rating scale, medication(s)/side effects and non-pharmacologic comfort measures Outcome: Progressing   Problem: Nutrition: Goal: Adequate nutrition will be maintained Outcome: Progressing   Problem: Pain Managment: Goal: General experience of comfort will improve and/or be controlled Outcome: Progressing

## 2023-10-19 NOTE — Progress Notes (Signed)
 Physical Therapy Treatment Patient Details Name: Robert Vega MRN: [id number] DOB: [birth year] Today's Date: 10/19/2023   History of Present Illness 83 y.o male with significant PMH of OSA, GERD, Obesity, HTN, Dysphagia: EGD 03/2022 with food in upper esophagus complicated by aspiration event, cardiac arrest with round of CPR, and post resuscitation EGD with concern for lack of peristalsis. Pt presented to ED on 10/05/2023 with hypoxia, fever and generalized weakness; developed acute respiratory failure requiring intubation 5/10 due to aspiration pneumonia, extubated 5/15, but had significant agitation required brief course of Precedex .    PT Comments  Pt received in bed, sitter at bedside. Pt agreed to PT session.Improved ability to transfer to EOB with HOB elevated and heavy use of rail, however he would require significant assist from regular flat bed. Sit>stand from bed with MinA to power up and attain upright standing balance. Pt trialed SPC during gait training with CGA, no LOB, improved recall of "holding head up" during gait stating "This is how the coach wanted me to do it" - recalling previous PT session. Pt stated he uses a walking stick at home. Overall great tolerance for mobility. Very pleasant and cooperative during session, follows commands with increased time and is able to voice needs appropriately.   If plan is discharge home, recommend the following: A little help with walking and/or transfers;A lot of help with bathing/dressing/bathroom;Assistance with cooking/housework;Direct supervision/assist for medications management;Direct supervision/assist for financial management;Assist for transportation;Help with stairs or ramp for entrance;Supervision due to cognitive status   Can travel by private vehicle     Yes  Equipment Recommendations  Other (comment) (Defer to next level of care)    Recommendations for Other Services       Precautions / Restrictions  Precautions Precautions: Fall Recall of Precautions/Restrictions: Impaired Precaution/Restrictions Comments:  (+ Fall 5/22 when legs gave out walking to BR with nursing in the evening) Restrictions Weight Bearing Restrictions Per Provider Order: No     Mobility  Bed Mobility Overal bed mobility: Needs Assistance Bed Mobility: Sit to Supine     Supine to sit: Mod assist, HOB elevated (MinA with heavy reliance on railing and HOB raised)     General bed mobility comments: Improved supine to sit with use of railing and cues    Transfers Overall transfer level: Needs assistance Equipment used: None Transfers: Sit to/from Stand Sit to Stand: Min assist, From elevated surface           General transfer comment: Able to stand on 2nd attempt with cues for technique    Ambulation/Gait Ambulation/Gait assistance: Contact guard assist, Min assist Gait Distance (Feet): 160 Feet Assistive device: Straight cane Gait Pattern/deviations: Step-through pattern, Drifts right/left Gait velocity: decreased     General Gait Details: Improved recall for self correcting head/neck posture from previous session. Occasional staggering when lifting his head to neutral however no intervention required   Stairs             Wheelchair Mobility     Tilt Bed    Modified Rankin (Stroke Patients Only)       Balance Overall balance assessment: Needs assistance Sitting-balance support: No upper extremity supported, Feet supported Sitting balance-Leahy Scale: Good     Standing balance support: Single extremity supported, During functional activity Standing balance-Leahy Scale: Fair                              Musician Communication: Impaired Factors  Affecting Communication: Reduced clarity of speech  Cognition Arousal: Alert Behavior During Therapy: WFL for tasks assessed/performed   PT - Cognitive impairments: No family/caregiver present to  determine baseline   Orientation impairments: Situation, Time                   PT - Cognition Comments: remains confused to time and situation. Pleasant. Recalls previous PT session cuing him to keep his head up when ambulating Following commands: Intact Following commands impaired: Follows one step commands with increased time    Cueing Cueing Techniques: Verbal cues, Tactile cues  Exercises Other Exercises Other Exercises: Pt educated on role of PT and safety to reduce future falls. Other Exercises: pt soiled in bed. Assisted with standing/hygiene/bed change with sitter present.    General Comments General comments (skin integrity, edema, etc.): Pt required assist with urinal placement, tends to have minimal notice when he needs to void.      Pertinent Vitals/Pain Pain Assessment Pain Assessment: No/denies pain    Home Living                          Prior Function            PT Goals (current goals can now be found in the care plan section) Acute Rehab PT Goals Patient Stated Goal: Go home to my dog Progress towards PT goals: Progressing toward goals    Frequency    Min 3X/week      PT Plan      Co-evaluation              AM-PAC PT "6 Clicks" Mobility   Outcome Measure  Help needed turning from your back to your side while in a flat bed without using bedrails?: A Little Help needed moving from lying on your back to sitting on the side of a flat bed without using bedrails?: A Little Help needed moving to and from a bed to a chair (including a wheelchair)?: A Little Help needed standing up from a chair using your arms (e.g., wheelchair or bedside chair)?: A Little Help needed to walk in hospital room?: A Little Help needed climbing 3-5 steps with a railing? : A Lot 6 Click Score: 17    End of Session Equipment Utilized During Treatment: Gait belt Activity Tolerance: Patient tolerated treatment well Patient left: in chair;with call  bell/phone within reach;with chair alarm set;with nursing/sitter in room Nurse Communication: Mobility status PT Visit Diagnosis: Unsteadiness on feet (R26.81);Repeated falls (R29.6);Muscle weakness (generalized) (M62.81);History of falling (Z91.81);Difficulty in walking, not elsewhere classified (R26.2)     Time: 9629-5284 PT Time Calculation (min) (ACUTE ONLY): 25 min  Charges:    $Gait Training: 8-22 mins $Therapeutic Activity: 8-22 mins PT General Charges $$ ACUTE PT VISIT: 1 Visit                    Melvyn Stagers, PTA  Diona Franklin 10/19/2023, 1:19 PM

## 2023-10-20 DIAGNOSIS — I10 Essential (primary) hypertension: Secondary | ICD-10-CM | POA: Diagnosis not present

## 2023-10-20 DIAGNOSIS — K9420 Gastrostomy complication, unspecified: Secondary | ICD-10-CM | POA: Diagnosis not present

## 2023-10-20 DIAGNOSIS — R1319 Other dysphagia: Secondary | ICD-10-CM | POA: Diagnosis not present

## 2023-10-20 DIAGNOSIS — J9601 Acute respiratory failure with hypoxia: Secondary | ICD-10-CM | POA: Diagnosis not present

## 2023-10-20 DIAGNOSIS — K220 Achalasia of cardia: Secondary | ICD-10-CM | POA: Diagnosis not present

## 2023-10-20 DIAGNOSIS — R410 Disorientation, unspecified: Secondary | ICD-10-CM | POA: Diagnosis not present

## 2023-10-20 DIAGNOSIS — N179 Acute kidney failure, unspecified: Secondary | ICD-10-CM

## 2023-10-20 DIAGNOSIS — R531 Weakness: Secondary | ICD-10-CM | POA: Diagnosis not present

## 2023-10-20 DIAGNOSIS — A419 Sepsis, unspecified organism: Secondary | ICD-10-CM | POA: Diagnosis not present

## 2023-10-20 DIAGNOSIS — J189 Pneumonia, unspecified organism: Secondary | ICD-10-CM | POA: Diagnosis not present

## 2023-10-20 LAB — GLUCOSE, CAPILLARY
Glucose-Capillary: 109 mg/dL — ABNORMAL HIGH (ref 70–99)
Glucose-Capillary: 110 mg/dL — ABNORMAL HIGH (ref 70–99)
Glucose-Capillary: 111 mg/dL — ABNORMAL HIGH (ref 70–99)
Glucose-Capillary: 114 mg/dL — ABNORMAL HIGH (ref 70–99)
Glucose-Capillary: 119 mg/dL — ABNORMAL HIGH (ref 70–99)
Glucose-Capillary: 128 mg/dL — ABNORMAL HIGH (ref 70–99)

## 2023-10-20 LAB — BASIC METABOLIC PANEL WITH GFR
Anion gap: 8 (ref 5–15)
BUN: 37 mg/dL — ABNORMAL HIGH (ref 8–23)
CO2: 30 mmol/L (ref 22–32)
Calcium: 8.2 mg/dL — ABNORMAL LOW (ref 8.9–10.3)
Chloride: 101 mmol/L (ref 98–111)
Creatinine, Ser: 1.72 mg/dL — ABNORMAL HIGH (ref 0.61–1.24)
GFR, Estimated: 39 mL/min — ABNORMAL LOW (ref >60)
Glucose, Bld: 112 mg/dL — ABNORMAL HIGH (ref 70–99)
Potassium: 4.5 mmol/L (ref 3.5–5.1)
Sodium: 139 mmol/L (ref 135–145)

## 2023-10-20 LAB — CBC
HCT: 33 % — ABNORMAL LOW (ref 39.0–52.0)
Hemoglobin: 10.9 g/dL — ABNORMAL LOW (ref 13.0–17.0)
MCH: 31.8 pg (ref 26.0–34.0)
MCHC: 33 g/dL (ref 30.0–36.0)
MCV: 96.2 fL (ref 80.0–100.0)
Platelets: 270 10*3/uL (ref 150–400)
RBC: 3.43 MIL/uL — ABNORMAL LOW (ref 4.22–5.81)
RDW: 12.8 % (ref 11.5–15.5)
WBC: 8.4 10*3/uL (ref 4.0–10.5)
nRBC: 0 % (ref 0.0–0.2)

## 2023-10-20 MED ORDER — QUETIAPINE FUMARATE 25 MG PO TABS: 75.0000 mg | ORAL_TABLET | Freq: Every day | ORAL | Status: DC

## 2023-10-20 MED ORDER — LACTULOSE 10 GM/15ML PO SOLN: 30.0000 g | Freq: Once | ORAL | Status: DC

## 2023-10-20 MED ORDER — SODIUM CHLORIDE 0.9 % IV SOLN: INTRAVENOUS | Status: DC

## 2023-10-20 MED ORDER — DEXTROSE-SODIUM CHLORIDE 5-0.9 % IV SOLN: INTRAVENOUS | Status: AC

## 2023-10-20 NOTE — Assessment & Plan Note (Signed)
 Creatinine went up to 1.83.  Continue IV fluids.  Continue free water .  Will change Lovenox  injections over to heparin subcutaneous injection.  Nursing staff to do a bladder scan.

## 2023-10-20 NOTE — Assessment & Plan Note (Signed)
 Patient pulled out PEG tube.  PEG tube replaced by gastroenterology on 5/25.  Will change tube feedings over to bolus feeds.

## 2023-10-20 NOTE — Progress Notes (Addendum)
 Progress Note   Patient: Robert Vega ZOX:[id number] DOB: Nov 14, 1940 DOA: 10/05/2023     15 DOS: the patient was seen and examined on 10/20/2023   Brief hospital course: 83 y.o male with significant PMH of OSA, GERD, Obesity, HTN, Dysphagia: EGD 03/2022 with food in upper esophagus complicated by aspiration event, cardiac arrest with round of CPR, and post resuscitation EGD with concern for lack of peristalsis. Prior EGD 08/2020 with note of abnormal cricopharyngeus, decrease in motility in esophagus, and spastic LES who presented to the ED from with hypoxia, fever and generalized weakness.  Patient developed acute respiratory failure requiring intubation due to aspiration pneumonia, he was treated with broad-spectrum antibiotics and managed in ICU. Condition had improved, extubated 5/15, but has significant agitation required brief course of Precedex . 5/10: Admit to [facility] service with sepsis due to Aspiration Pneumonia.  Course complicated by Acute Respiratory Failure due to Aspiration of vomitus requiring s/p cardiac arrest  transfer to ICU and intubation.  5/11 flexible bronchoscopy done by critical care team 5/12 remains intubated 5/13 remains on vent, s/p PEG tube, +vomiting last night, failed SAT/SBT 5/14 extubated successfully but with severe delirium 5/20.  Botulinum toxin injection into the lower esophageal sphincter by Dr. Ole Berkeley  5/21 esophagram showing diffusely distended esophagus with distal obstruction and severe dysmotility suggestive of achalasia. 5/22.  Advised I would not give him any food and will continue PEG feeding he can try liquids to see if that goes down but still high risk for aspiration.  Patient had a fall on the evening of 5/22. 5/23.  Patient feels okay.  Awaken from sleep.  Did not offer any complaints.  Awaiting insurance authorization for rehab. 5/24.  Patient feeling okay.  States he is doing okay with the liquid diet.  Still feels weak. 5/25.  Patient pulled out  PEG tube this morning.  Foley catheter placed in the stoma.  GI to see if we can get another PEG tube to place.  Assessment and Plan: * Complication of feeding tube [facility]) Patient pulled out PEG tube.  Able to place Foley catheter in opening, balloon inflated and heard a gurgle when pushing air through.  GI to see if we can get a PEG tube for placement today.  Achalasia of esophagus Status post botulinum toxin injection on 5/20.  Esophagram showing dilated esophagus distal obstruction.  Dysphagia Likely secondary to achalasia.  Patient had Botox  injection of the lower esophageal sphincter due to lower esophageal sphincter hypertension causing the patient not being able to swallow and empty the esophagus of his ingested food.  Food was in the esophagus on the previous EGD.  With barium swallow study results, I do not want to give him any food.    Acute respiratory failure with hypoxia and hypercapnia (HCC) Pneumonia Patient now on room air  AKI (acute kidney injury) (HCC) Creatinine went up to 1.72.  IV fluid hydration started.  Septic shock (HCC) Present on admission with acute hypoxic respiratory failure, pneumonia bilateral lower lobes.  Patient completed antibiotics.  GERD (gastroesophageal reflux disease) PPI   Essential hypertension, benign Continue Norvasc   Generalized weakness Patient had a fall the other night.  Patient still needs hand-held assistance with 1 person in order to ambulate.  Patient is unsteady and staggers with the physical therapy team.  Obesity (BMI 30-39.9) BMI 33.16  Acute delirium Continue Seroquel  at night.  Haldol  as needed.  Likely sundown's in the late afternoon early evening.  Discontinue Reglan .  Subjective: Patient pulled out the PEG tube this morning.  Apparently did not sleep much last night.  Patient received Haldol  and went to sleep.  Foley catheter placed into PEG site.  GI to see if we can get a PEG tube for  placement  Physical Exam: Vitals:   10/19/23 1512 10/19/23 1930 10/20/23 0559 10/20/23 1025  BP: (!) 120/59 (!) 155/66 (!) 143/66 123/72  Pulse: 78 93 78 74  Resp: 17 20 20 16   Temp: (!) 97.4 F (36.3 C) 98.5 F (36.9 C) 98.1 F (36.7 C) 97.8 F (36.6 C)  TempSrc:  Oral  Oral  SpO2: 96% 100% 95% 95%  Weight:      Height:       Physical Exam HENT:     Head: Normocephalic.     Mouth/Throat:     Pharynx: No oropharyngeal exudate.  Eyes:     General: Lids are normal.     Conjunctiva/sclera: Conjunctivae normal.  Cardiovascular:     Rate and Rhythm: Normal rate and regular rhythm.     Heart sounds: Normal heart sounds, S1 normal and S2 normal.  Pulmonary:     Breath sounds: Examination of the right-lower field reveals decreased breath sounds. Examination of the left-lower field reveals decreased breath sounds. Decreased breath sounds present. No wheezing, rhonchi or rales.  Abdominal:     Palpations: Abdomen is soft.     Tenderness: There is no abdominal tenderness.  Musculoskeletal:     Right lower leg: No swelling.     Left lower leg: No swelling.  Skin:    General: Skin is warm.     Findings: No rash.  Neurological:     Mental Status: He is lethargic.     Comments: Patient was just given Haldol .     Data Reviewed: Creatinine 1.72, white blood count 8.4, hemoglobin 10.9, platelet count 270  Family Communication: Updated granddaughter on the phone  Disposition: Status is: Inpatient Remains inpatient appropriate because: Patient pulled out PEG tube.  Planned Discharge Destination: Awaiting appeal on acute inpatient rehab    Time spent: 35 minutes Case discussed with nursing staff and gastroenterology  Author: Verla Glaze, MD 10/20/2023 11:27 AM  For on call review [email or web address].

## 2023-10-20 NOTE — Progress Notes (Signed)
 I was notified by Dr. Clelia Current that this patient pulled out his PEG tube last night which was placed by Dr. Ole Berkeley on 10/08/2023 I have advised to place Foley catheter temporarily to maintain the track  Removed the Foley and replaced with 18 French replacement G-tube.  Position of the G-tube confirmed by auscultation/air bubble method.  External bumper at 3 cm marking  Okay to reuse G-tube Continue routine G-tube care  Ellis Guys, MD

## 2023-10-20 NOTE — Plan of Care (Incomplete)
   Problem: Education: Goal: Knowledge of General Education information will improve Description: Including pain rating scale, medication(s)/side effects and non-pharmacologic comfort measures Outcome: Progressing   Problem: Skin Integrity: Goal: Risk for impaired skin integrity will decrease Outcome: Progressing

## 2023-10-20 NOTE — Progress Notes (Signed)
 Using sterile gloves, a foley catheter was placed in the peg site, balloon inflated and we pushed some air through and heard a gurgle.  Dr Baldomero Bone (gastroneterology) will replace Peg today.  Dr Clelia Current

## 2023-10-20 NOTE — Plan of Care (Signed)
  Problem: Education: Goal: Knowledge of General Education information will improve Description: Including pain rating scale, medication(s)/side effects and non-pharmacologic comfort measures 10/20/2023 1612 by Abraham Abo D, LPN Outcome: Progressing 10/20/2023 1611 by Abraham Abo D, LPN Outcome: Progressing   Problem: Safety: Goal: Ability to remain free from injury will improve Outcome: Progressing   Problem: Skin Integrity: Goal: Risk for impaired skin integrity will decrease Outcome: Progressing

## 2023-10-21 ENCOUNTER — Inpatient Hospital Stay

## 2023-10-21 DIAGNOSIS — K9420 Gastrostomy complication, unspecified: Secondary | ICD-10-CM | POA: Diagnosis not present

## 2023-10-21 DIAGNOSIS — I878 Other specified disorders of veins: Secondary | ICD-10-CM | POA: Diagnosis not present

## 2023-10-21 DIAGNOSIS — N134 Hydroureter: Secondary | ICD-10-CM | POA: Diagnosis not present

## 2023-10-21 DIAGNOSIS — K449 Diaphragmatic hernia without obstruction or gangrene: Secondary | ICD-10-CM | POA: Diagnosis not present

## 2023-10-21 DIAGNOSIS — R1084 Generalized abdominal pain: Secondary | ICD-10-CM

## 2023-10-21 DIAGNOSIS — Z4682 Encounter for fitting and adjustment of non-vascular catheter: Secondary | ICD-10-CM | POA: Diagnosis not present

## 2023-10-21 DIAGNOSIS — N179 Acute kidney failure, unspecified: Secondary | ICD-10-CM

## 2023-10-21 DIAGNOSIS — K9423 Gastrostomy malfunction: Secondary | ICD-10-CM | POA: Diagnosis not present

## 2023-10-21 DIAGNOSIS — R109 Unspecified abdominal pain: Secondary | ICD-10-CM | POA: Diagnosis not present

## 2023-10-21 DIAGNOSIS — N1330 Unspecified hydronephrosis: Secondary | ICD-10-CM | POA: Diagnosis not present

## 2023-10-21 LAB — CBC WITH DIFFERENTIAL/PLATELET
Abs Immature Granulocytes: 0.05 10*3/uL (ref 0.00–0.07)
Basophils Absolute: 0 10*3/uL (ref 0.0–0.1)
Basophils Relative: 1 %
Eosinophils Absolute: 0.1 10*3/uL (ref 0.0–0.5)
Eosinophils Relative: 1 %
HCT: 33.6 % — ABNORMAL LOW (ref 39.0–52.0)
Hemoglobin: 11.1 g/dL — ABNORMAL LOW (ref 13.0–17.0)
Immature Granulocytes: 1 %
Lymphocytes Relative: 13 %
Lymphs Abs: 0.9 10*3/uL (ref 0.7–4.0)
MCH: 31.8 pg (ref 26.0–34.0)
MCHC: 33 g/dL (ref 30.0–36.0)
MCV: 96.3 fL (ref 80.0–100.0)
Monocytes Absolute: 1.2 10*3/uL — ABNORMAL HIGH (ref 0.1–1.0)
Monocytes Relative: 16 %
Neutro Abs: 5.1 10*3/uL (ref 1.7–7.7)
Neutrophils Relative %: 68 %
Platelets: 300 10*3/uL (ref 150–400)
RBC: 3.49 MIL/uL — ABNORMAL LOW (ref 4.22–5.81)
RDW: 12.9 % (ref 11.5–15.5)
WBC: 7.4 10*3/uL (ref 4.0–10.5)
nRBC: 0 % (ref 0.0–0.2)

## 2023-10-21 LAB — BASIC METABOLIC PANEL WITH GFR
Anion gap: 8 (ref 5–15)
BUN: 30 mg/dL — ABNORMAL HIGH (ref 8–23)
CO2: 27 mmol/L (ref 22–32)
Calcium: 8.1 mg/dL — ABNORMAL LOW (ref 8.9–10.3)
Chloride: 106 mmol/L (ref 98–111)
Creatinine, Ser: 1.83 mg/dL — ABNORMAL HIGH (ref 0.61–1.24)
GFR, Estimated: 36 mL/min — ABNORMAL LOW (ref >60)
Glucose, Bld: 119 mg/dL — ABNORMAL HIGH (ref 70–99)
Potassium: 4.3 mmol/L (ref 3.5–5.1)
Sodium: 141 mmol/L (ref 135–145)

## 2023-10-21 LAB — GLUCOSE, CAPILLARY
Glucose-Capillary: 109 mg/dL — ABNORMAL HIGH (ref 70–99)
Glucose-Capillary: 113 mg/dL — ABNORMAL HIGH (ref 70–99)
Glucose-Capillary: 117 mg/dL — ABNORMAL HIGH (ref 70–99)
Glucose-Capillary: 120 mg/dL — ABNORMAL HIGH (ref 70–99)
Glucose-Capillary: 125 mg/dL — ABNORMAL HIGH (ref 70–99)
Glucose-Capillary: 134 mg/dL — ABNORMAL HIGH (ref 70–99)

## 2023-10-21 MED ORDER — HEPARIN SODIUM (PORCINE) 5000 UNIT/ML IJ SOLN: 5000.0000 [IU] | Freq: Three times a day (TID) | INTRAMUSCULAR | Status: DC

## 2023-10-21 MED ORDER — OLANZAPINE 10 MG IM SOLR
10.0000 mg | Freq: Every evening | INTRAMUSCULAR | Status: DC | PRN
  Administered 2023-10-23 – 2023-10-25 (×2): 10 mg via INTRAMUSCULAR
  Filled 2023-10-21 (×3): qty 10

## 2023-10-21 MED ORDER — SODIUM CHLORIDE 0.9 % IV SOLN
3.0000 g | Freq: Four times a day (QID) | INTRAVENOUS | Status: DC
  Filled 2023-10-21 (×2): qty 8

## 2023-10-21 MED ORDER — IOHEXOL 300 MG/ML  SOLN
30.0000 mL | Freq: Once | INTRAMUSCULAR | Status: DC | PRN
  Administered 2023-10-21: 30 mL via ORAL

## 2023-10-21 MED ORDER — HALOPERIDOL LACTATE 5 MG/ML IJ SOLN
1.0000 mg | INTRAMUSCULAR | Status: DC | PRN
  Administered 2023-10-21 – 2023-10-28 (×10): 1 mg via INTRAVENOUS
  Filled 2023-10-21 (×10): qty 1

## 2023-10-21 MED ORDER — LACTATED RINGERS IV SOLN
150.0000 mL/h | INTRAVENOUS | Status: AC
  Administered 2023-10-22: 150 mL/h via INTRAVENOUS

## 2023-10-21 MED ORDER — GENTAMICIN SULFATE 0.1 % EX OINT
TOPICAL_OINTMENT | Freq: Three times a day (TID) | CUTANEOUS | Status: DC
  Administered 2023-10-24: 1 via TOPICAL
  Filled 2023-10-21 (×5): qty 15

## 2023-10-21 MED ORDER — PIPERACILLIN-TAZOBACTAM 3.375 G IVPB
3.3750 g | Freq: Three times a day (TID) | INTRAVENOUS | Status: AC
  Administered 2023-10-21 – 2023-10-26 (×16): 3.375 g via INTRAVENOUS
  Filled 2023-10-21 (×16): qty 50

## 2023-10-21 MED ORDER — OSMOLITE 1.5 CAL PO LIQD: 237.0000 mL | Freq: Every day | ORAL | Status: DC

## 2023-10-21 MED ORDER — FREE WATER: 100.0000 mL | Freq: Every day | Status: DC

## 2023-10-21 MED ORDER — SODIUM CHLORIDE 0.9 % IV BOLUS (SEPSIS)
1000.0000 mL | Freq: Once | INTRAVENOUS | Status: AC
  Administered 2023-10-22: 1000 mL via INTRAVENOUS

## 2023-10-21 MED ORDER — QUETIAPINE FUMARATE 25 MG PO TABS: 50.0000 mg | ORAL_TABLET | Freq: Every day | ORAL | Status: DC

## 2023-10-21 MED ORDER — DEXTROSE IN LACTATED RINGERS 5 % IV SOLN: INTRAVENOUS | Status: AC

## 2023-10-21 MED ORDER — SODIUM CHLORIDE 0.9 % IV SOLN: INTRAVENOUS | Status: DC

## 2023-10-21 MED ORDER — DEXTROSE-SODIUM CHLORIDE 5-0.9 % IV SOLN
INTRAVENOUS | Status: DC
Start: 2023-10-21 — End: 2023-10-21

## 2023-10-21 NOTE — Progress Notes (Signed)
 Patient had approximately 150 ml in through G Tube (osmolite, free water , medication) and then approximately 900 ml of the CT contrast

## 2023-10-21 NOTE — Progress Notes (Signed)
 Nutrition Follow-up  DOCUMENTATION CODES:   Obesity unspecified  INTERVENTION:   -TF via PEG (transition to bolus feedings on 10/22/23):  237 ml Osmolite 1.5 6 times daily  50 ml free water  flush before and after each feeding administration  Tube feeding regimen provides 2130 kcal (100% of needs), 89 grams of protein, and 1086 ml of H2O. Total free water : 1686 ml daily   NUTRITION DIAGNOSIS:   Inadequate oral intake related to inability to eat (pt sedated and ventilated) as evidenced by NPO status.  Ongoing  GOAL:   Patient will meet greater than or equal to 90% of their needs  Progressing   MONITOR:   Diet advancement, Labs, Weight trends, TF tolerance, I & O's, Skin  REASON FOR ASSESSMENT:   Consult Assessment of nutrition requirement/status  ASSESSMENT:   83 y/o male with h/o GERD, esophageal dysphagia, HTN, BPH, mood disorder and hiatal hernia who is admitted with aspiration event, PNA, sepsis, cardiac arrest and dysphagia.  5/13- s/p EGD- found to have food in the entire esophagus and gastric polyps. PEG tube placed  5/14- extubated 5/15- TF re-started 5/20- s/p EGD- esophageal dilation with botox injection 5/22- advanced to clear liquid diet 5/24- pt pulled out PEG 5/25- PEG replaced by GI  Reviewed I/O's: -145 ml x 24 hours and -3.1 L since 10/07/23   Per MD, pt pulled out PEG and replaced today. Requesting transition to bolus feeds.   Osmolite 1.5 currently infusing via PEG at 40 ml/hr.   Wt has been stable over the past week.   Medications reviewed and include colace, lovenox , melatonin, protonix , and dextrose  5%-0.9% sodium chloride  infusion @ 75 ml/hr.   Per TOC notes, plan for CIR vs SNF.   Palliative care following for goals of care.   Labs reviewed: CBGS: 109-125 (inpatient orders for glycemic control are 0-9 units insulin  aspart every 4 hours).    Diet Order:   Diet Order             Diet clear liquid Fluid consistency: Thin; Fluid  restriction: 1200 mL Fluid  Diet effective now                   EDUCATION NEEDS:   No education needs have been identified at this time  Skin:  Skin Assessment: Skin Integrity Issues: Skin Integrity Issues:: Other (Comment) Other: skin tear to lt lower arm  Last BM:  10/15/23 (type 5)  Height:   Ht Readings from Last 1 Encounters:  10/08/23 5' 9.02" (1.753 m)    Weight:   Wt Readings from Last 1 Encounters:  10/19/23 101.9 kg    Ideal Body Weight:  72.7 kg  BMI:  Body mass index is 33.16 kg/m.  Estimated Nutritional Needs:   Kcal:  2000-2300kcal/day  Protein:  100-115g/day  Fluid:  1.8-2.1L/day    Herschel Lords, RD, LDN, CDCES Registered Dietitian III Certified Diabetes Care and Education Specialist If unable to reach this RD, please use "RD Inpatient" group chat on secure chat between hours of 8am-4 pm daily

## 2023-10-21 NOTE — Progress Notes (Signed)
       CROSS COVER NOTE  NAME: Robert Vega MRN: [id number] DOB : 1941/05/04    Concern as stated by nurse / staff   Sign out from Dr Baldomero Bone :pulled out the PEG yesterday and now the replacement G-tube which is intraperitoneal and he developed moderate pneumoperitoneum and spillage of tube feeds. He is clinically stable. Ordered Zosyn  1 dose he is working on getting IVs. His abdomen is soft but distended as expected. I consulted Dr. Cornel Diesel, general surgery. He is aware of the case. Could you please check on him sometime tonight      Chart review Hospitalist progress note and GI note reviewed   Bedside assessment Patient assessed, sitter at bedside.  He appears to be resting comfortably, albeit slightly restless, has mittens on    10/21/2023    9:28 PM 10/21/2023    9:25 PM 10/21/2023    2:39 PM  Vitals with BMI  Systolic  102 141  Diastolic  58 82  Pulse 104 106 85  Physical Exam Vitals and nursing note reviewed.  Constitutional:      General: He is sleeping. He is not in acute distress.    Comments: Patient resting with eyes closed, mittens on, slightly restless, moving arms, occasionally mumbling imperceptively  Cardiovascular:     Rate and Rhythm: Normal rate and regular rhythm.     Heart sounds: Normal heart sounds.  Pulmonary:     Comments: Slight coarseness to respiratory sounds but otherwise breathing comfortably Abdominal:     General: There is no distension.     Palpations: Abdomen is soft.     Tenderness: There is no abdominal tenderness.     Comments: Soft, nontender on palpation, nondistended  Neurological:     Mental Status: He is easily aroused.      Patient Assessment   Assessment and  Interventions   Assessment:  Pneumoperitoneum secondary to feeding tube dislodgment, with spillage of tube feeds, clinically stable  Plan: Will continue close monitoring overnight for signs of clinical decompensation and manage as needed, updating surgery or GI if  needed Continue placement of large-bore IVs No further workup indicated at this time based on physical assessment Discussed plan with bedside nurse as well as with GI, Dr. Baldomero Bone      CRITICAL CARE Performed by: Lanetta Pion   Total critical care time: 30 minutes  Critical care time was exclusive of separately billable procedures and treating other patients.  Critical care was necessary to treat or prevent imminent or life-threatening deterioration.  Critical care was time spent personally by me on the following activities: development of treatment plan with patient and/or surrogate as well as nursing, discussions with consultants, evaluation of patient's response to treatment, examination of patient, obtaining history from patient or surrogate, ordering and performing treatments and interventions, ordering and review of laboratory studies, ordering and review of radiographic studies, pulse oximetry and re-evaluation of patient's condition.

## 2023-10-21 NOTE — Progress Notes (Signed)
 I was informed by Dr Tyler Gallant about CT findings, G-tube not in the stomach, has spillage of contrast into the peritoneal cavity and moderate amount of pneumoperitoneum. Tube feeds discontinued, zosyn  was ordered. I consulted general surgery on call, Dr Cornel Diesel, discussed case with him, manage conservatively for now. No emergent need for surgery.  Per his nurse, he is resting comfortably in bed, vitals stable, abdomen was soft but distended as expected. I have informed Dr. Vallarie Gauze to check on him overnight Asked nurse to monitor vitals every 2 hours and serial abdominal exams  Ordered KUB for AM Dr. Ole Berkeley will reassess him tomorrow We will hold off on repeating EGD tomorrow  Ellis Guys, MD

## 2023-10-21 NOTE — Progress Notes (Addendum)
 Patient having abdominal pain and distention after tube feedings and free water .  Tube feeds held.  Patient having less bloating now.  Patient has not had a bowel movement in a few days.  Abdominal x-ray ordered which showed some stool in the rectum and paucity of air in the colon.  Will get a CT scan of the abdomen with Gastrografin down the PEG tube.  Patient had PEG replacement yesterday.  Physical Exam Cardiovascular:     Rate and Rhythm: Normal rate and regular rhythm.     Heart sounds: Normal heart sounds, S1 normal and S2 normal.  Pulmonary:     Breath sounds: Examination of the right-lower field reveals decreased breath sounds. Examination of the left-lower field reveals decreased breath sounds. Decreased breath sounds present. No wheezing, rhonchi or rales.  Abdominal:     General: There is distension.     Palpations: Abdomen is soft.     Tenderness: There is generalized abdominal tenderness.     Plan will get a CT scan of the abdomen with Gastrografin down the PEG tube for further evaluation of abdominal pain and distention  Dr Verla Glaze 20 minutes  Was notified that patient had a large bowel movement.  So this may help out with his discomfort and bloating.

## 2023-10-21 NOTE — Progress Notes (Addendum)
       CROSS COVER NOTE  NAME: DEYVI BONANNO MRN: [id number] DOB : 24-Oct-1940       Patient reassessment following conversation with surgery  I returned to bedside along with bedside nurse with Dr. Cornel Diesel on speaker phone as we reassessed patient     Pertinent findings on chart review:   Patient Assessment    10/21/2023   11:27 PM 10/21/2023   10:55 PM 10/21/2023    9:28 PM  Vitals with BMI  Systolic 96    Diastolic 55    Pulse 102 98 104   O2 sat 92% on room air Heart rate 97-98 during assessment  Physical Exam Constitutional:      Comments: Patient is now more somnolent, will awaken with shaking(got Haldol  at 4 PM), but winces with palpation in left upper quadrant Sitter at bedside states patient was more awake and able prior during first assessment  Cardiovascular:     Rate and Rhythm: Tachycardia present.  Pulmonary:     Effort: No accessory muscle usage or prolonged expiration.     Comments: Mild tachypnea to 20 Abdominal:       Comments: Very mild distention Firmness to area highlighted on diagram, about 10 cm x 3 along sternal border.  Patient winces on deep palpation in this area, not on superficial palpation  G-tube still located in lower epigastric area, as highlighted  Neurological:     Comments: Somnolent, not opening eyes when name is called but on shaking, will mumble but not opening eyes      Assessment and  Interventions   Assessment:  -Pneumoperitoneum secondary to G-tube dislodgment, with progression and abdominal findings -Hypotension..?  Circulatory compromise  Plan: Dr. Cornel Diesel plans to come in Placed on telemetry per Dr. Cornel Diesel Continue close monitoring for signs of circulatory shock Maintain 2 large-bore IV Will get sepsis labs and give a fluid bolus Plan discussed with bedside nurse and Dr. Cornel Diesel on phone Advised bedside sitter to alert if any changes Pending sepsis workup we will transfer to higher level of care Continue Zosyn      CRITICAL CARE Performed by: Lanetta Pion   Total critical care time: 30 minutes  Critical care time was exclusive of separately billable procedures and treating other patients.  Critical care was necessary to treat or prevent imminent or life-threatening deterioration.  Critical care was time spent personally by me on the following activities: development of treatment plan with patient and/or surrogate as well as nursing, discussions with consultants, evaluation of patient's response to treatment, examination of patient, obtaining history from patient or surrogate, ordering and performing treatments and interventions, ordering and review of laboratory studies, ordering and review of radiographic studies, pulse oximetry and re-evaluation of patient's condition.

## 2023-10-21 NOTE — Progress Notes (Signed)
 Progress Note   Patient: Robert Vega [id number] DOB: 04/28/1941 DOA: 10/05/2023     16 DOS: the patient was seen and examined on 10/21/2023   Brief hospital course: 83 y.o male with significant PMH of OSA, GERD, Obesity, HTN, Dysphagia: EGD 03/2022 with food in upper esophagus complicated by aspiration event, cardiac arrest with round of CPR, and post resuscitation EGD with concern for lack of peristalsis. Prior EGD 08/2020 with note of abnormal cricopharyngeus, decrease in motility in esophagus, and spastic LES who presented to the ED from with hypoxia, fever and generalized weakness.  Patient developed acute respiratory failure requiring intubation due to aspiration pneumonia, he was treated with broad-spectrum antibiotics and managed in ICU. Condition had improved, extubated 5/15, but has significant agitation required brief course of Precedex . 5/10: Admit to [facility] service with sepsis due to Aspiration Pneumonia.  Course complicated by Acute Respiratory Failure due to Aspiration of vomitus requiring s/p cardiac arrest  transfer to ICU and intubation.  5/11 flexible bronchoscopy done by critical care team 5/12 remains intubated 5/13 remains on vent, s/p PEG tube, +vomiting last night, failed SAT/SBT 5/14 extubated successfully but with severe delirium 5/20.  Botulinum toxin injection into the lower esophageal sphincter by Dr. Ole Berkeley  5/21 esophagram showing diffusely distended esophagus with distal obstruction and severe dysmotility suggestive of achalasia. 5/22.  Advised I would not give him any food and will continue PEG feeding he can try liquids to see if that goes down but still high risk for aspiration.  Patient had a fall on the evening of 5/22. 5/23.  Patient feels okay.  Awaken from sleep.  Did not offer any complaints.  Awaiting insurance authorization for rehab. 5/24.  Patient feeling okay.  States he is doing okay with the liquid diet.  Still feels weak. 5/25.  Patient pulled out  PEG tube this morning.  Foley catheter placed in the stoma.  GI able to place a another PEG tube and remove the Foley. 5/26.  Creatinine up at 1.83.  Continue IV fluid hydration.  Changed continuous tube feedings over to bolus tube feedings in order to lessen the risk of him pulling on the tubing.  Assessment and Plan: * Complication of feeding tube [facility]) Patient pulled out PEG tube.  PEG tube replaced by gastroenterology on 5/25.  Will change tube feedings over to bolus feeds.  AKI (acute kidney injury) (HCC) Creatinine went up to 1.83.  Continue IV fluids.  Continue free water .  Will change Lovenox  injections over to heparin subcutaneous injection.  Nursing staff to do a bladder scan.  Achalasia of esophagus Status post botulinum toxin injection on 5/20.  Esophagram showing dilated esophagus distal obstruction.  Dysphagia Likely secondary to achalasia.  Patient had Botox injection of the lower esophageal sphincter due to lower esophageal sphincter hypertension causing the patient not being able to swallow and empty the esophagus of his ingested food.  Food was in the esophagus on the previous EGD.  With barium swallow study results, I do not want to give him any food.  Clear liquid and monitor closely for aspiration.   Acute delirium Continue Seroquel  at night.  Haldol  as needed.  Likely sundown's in the late afternoon early evening.  Discontinued Reglan .  Septic shock (HCC) Present on admission with acute hypoxic respiratory failure, pneumonia bilateral lower lobes.  Patient completed antibiotics.  Acute respiratory failure with hypoxia and hypercapnia (HCC) Pneumonia Patient now on room air  Essential hypertension, benign Continue Norvasc   GERD (gastroesophageal reflux disease)  PPI   Generalized weakness Patient had a fall the other night.  Patient still needs hand-held assistance with 1 person in order to ambulate.  Patient is unsteady and staggers with the physical therapy  team.  Obesity (BMI 30-39.9) BMI 33.16        Subjective: Patient seen this morning answered a couple questions but kept his eyes closed.  Restart tube feedings today.  Physical Exam: Vitals:   10/20/23 1234 10/20/23 1554 10/20/23 2100 10/21/23 0753  BP: 120/68 125/63 130/60 (!) 145/76  Pulse: 73 74 80 73  Resp: 15 17 17 17   Temp: 97.6 F (36.4 C) 98 F (36.7 C) 97.6 F (36.4 C) 98.1 F (36.7 C)  TempSrc: Oral Oral Oral Oral  SpO2: 94% 96% 94% 92%  Weight:      Height:       Physical Exam HENT:     Head: Normocephalic.     Mouth/Throat:     Pharynx: No oropharyngeal exudate.  Eyes:     General: Lids are normal.     Conjunctiva/sclera: Conjunctivae normal.  Cardiovascular:     Rate and Rhythm: Normal rate and regular rhythm.     Heart sounds: Normal heart sounds, S1 normal and S2 normal.  Pulmonary:     Breath sounds: Examination of the right-lower field reveals decreased breath sounds. Examination of the left-lower field reveals decreased breath sounds. Decreased breath sounds present. No wheezing, rhonchi or rales.  Abdominal:     Palpations: Abdomen is soft.     Tenderness: There is no abdominal tenderness.  Musculoskeletal:     Right lower leg: No swelling.     Left lower leg: No swelling.  Skin:    General: Skin is warm.     Findings: No rash.  Neurological:     Mental Status: He is lethargic.     Comments: Patient seen earlier this morning and still was sleepy with Seroquel  last night.     Data Reviewed: Creatinine 1.83  Family Communication: Updated granddaughter on the phone  Disposition: Status is: Inpatient Continue IV fluid hydration with elevated creatinine.  Awaiting appeal about acute inpatient rehab  Planned Discharge Destination: Awaiting appeal about acute inpatient rehab    Time spent: 28 minutes Spoke with nursing staff at the bedside  Author: Verla Glaze, MD 10/21/2023 12:24 PM  For on call review [email or web address].

## 2023-10-21 NOTE — Progress Notes (Signed)
 Reviewed ct scan results. PEG tube not in correct place.  Spoke with nursing staff not to use the PEG tube today, will keep npo, continue ivf, will give empiric abx.  Case discussed with GI and they will set up egd for tomorrow.  Dr Clelia Current

## 2023-10-22 ENCOUNTER — Inpatient Hospital Stay

## 2023-10-22 ENCOUNTER — Inpatient Hospital Stay: Admitting: Certified Registered"

## 2023-10-22 ENCOUNTER — Other Ambulatory Visit: Payer: Self-pay

## 2023-10-22 ENCOUNTER — Encounter
Admission: EM | Discharge status: Against Medical Advice | Payer: Self-pay | Source: Home / Self Care | Attending: Internal Medicine

## 2023-10-22 ENCOUNTER — Encounter: Payer: Self-pay | Admitting: Internal Medicine

## 2023-10-22 DIAGNOSIS — K659 Peritonitis, unspecified: Secondary | ICD-10-CM | POA: Diagnosis not present

## 2023-10-22 DIAGNOSIS — A419 Sepsis, unspecified organism: Secondary | ICD-10-CM | POA: Diagnosis not present

## 2023-10-22 DIAGNOSIS — K9423 Gastrostomy malfunction: Secondary | ICD-10-CM | POA: Diagnosis not present

## 2023-10-22 DIAGNOSIS — K9429 Other complications of gastrostomy: Secondary | ICD-10-CM | POA: Diagnosis not present

## 2023-10-22 DIAGNOSIS — E669 Obesity, unspecified: Secondary | ICD-10-CM | POA: Diagnosis not present

## 2023-10-22 DIAGNOSIS — K220 Achalasia of cardia: Secondary | ICD-10-CM | POA: Diagnosis not present

## 2023-10-22 DIAGNOSIS — J9601 Acute respiratory failure with hypoxia: Secondary | ICD-10-CM | POA: Diagnosis not present

## 2023-10-22 DIAGNOSIS — N3289 Other specified disorders of bladder: Secondary | ICD-10-CM | POA: Diagnosis not present

## 2023-10-22 DIAGNOSIS — R531 Weakness: Secondary | ICD-10-CM | POA: Diagnosis not present

## 2023-10-22 DIAGNOSIS — Z4682 Encounter for fitting and adjustment of non-vascular catheter: Secondary | ICD-10-CM | POA: Diagnosis not present

## 2023-10-22 DIAGNOSIS — I10 Essential (primary) hypertension: Secondary | ICD-10-CM | POA: Diagnosis not present

## 2023-10-22 DIAGNOSIS — Z87891 Personal history of nicotine dependence: Secondary | ICD-10-CM | POA: Diagnosis not present

## 2023-10-22 DIAGNOSIS — K9420 Gastrostomy complication, unspecified: Secondary | ICD-10-CM | POA: Diagnosis not present

## 2023-10-22 DIAGNOSIS — R1319 Other dysphagia: Secondary | ICD-10-CM | POA: Diagnosis not present

## 2023-10-22 DIAGNOSIS — R410 Disorientation, unspecified: Secondary | ICD-10-CM | POA: Diagnosis not present

## 2023-10-22 DIAGNOSIS — Z931 Gastrostomy status: Secondary | ICD-10-CM | POA: Diagnosis not present

## 2023-10-22 DIAGNOSIS — N179 Acute kidney failure, unspecified: Secondary | ICD-10-CM | POA: Diagnosis not present

## 2023-10-22 HISTORY — PX: LAPAROTOMY: SHX154

## 2023-10-22 HISTORY — PX: CREATION, GASTROSTOMY, OPEN: SHX7546

## 2023-10-22 LAB — CBC WITH DIFFERENTIAL/PLATELET
Abs Immature Granulocytes: 0.14 10*3/uL — ABNORMAL HIGH (ref 0.00–0.07)
Abs Immature Granulocytes: 0.14 10*3/uL — ABNORMAL HIGH (ref 0.00–0.07)
Basophils Absolute: 0 10*3/uL (ref 0.0–0.1)
Basophils Absolute: 0 10*3/uL (ref 0.0–0.1)
Basophils Relative: 0 %
Basophils Relative: 0 %
Eosinophils Absolute: 0 10*3/uL (ref 0.0–0.5)
Eosinophils Absolute: 0 10*3/uL (ref 0.0–0.5)
Eosinophils Relative: 0 %
Eosinophils Relative: 0 %
HCT: 32.9 % — ABNORMAL LOW (ref 39.0–52.0)
HCT: 32.1 % — ABNORMAL LOW (ref 39.0–52.0)
Hemoglobin: 11.3 g/dL — ABNORMAL LOW (ref 13.0–17.0)
Hemoglobin: 11 g/dL — ABNORMAL LOW (ref 13.0–17.0)
Immature Granulocytes: 1 %
Immature Granulocytes: 1 %
Lymphocytes Relative: 2 %
Lymphocytes Relative: 3 %
Lymphs Abs: 0.4 10*3/uL — ABNORMAL LOW (ref 0.7–4.0)
Lymphs Abs: 0.5 10*3/uL — ABNORMAL LOW (ref 0.7–4.0)
MCH: 32.5 pg (ref 26.0–34.0)
MCH: 32.3 pg (ref 26.0–34.0)
MCHC: 34.3 g/dL (ref 30.0–36.0)
MCHC: 34.3 g/dL (ref 30.0–36.0)
MCV: 94.5 fL (ref 80.0–100.0)
MCV: 94.1 fL (ref 80.0–100.0)
Monocytes Absolute: 1.7 10*3/uL — ABNORMAL HIGH (ref 0.1–1.0)
Monocytes Absolute: 1.8 10*3/uL — ABNORMAL HIGH (ref 0.1–1.0)
Monocytes Relative: 9 %
Monocytes Relative: 9 %
Neutro Abs: 16.4 10*3/uL — ABNORMAL HIGH (ref 1.7–7.7)
Neutro Abs: 17.2 10*3/uL — ABNORMAL HIGH (ref 1.7–7.7)
Neutrophils Relative %: 88 %
Neutrophils Relative %: 87 %
Platelets: 300 10*3/uL (ref 150–400)
Platelets: 286 10*3/uL (ref 150–400)
RBC: 3.48 MIL/uL — ABNORMAL LOW (ref 4.22–5.81)
RBC: 3.41 MIL/uL — ABNORMAL LOW (ref 4.22–5.81)
RDW: 13 % (ref 11.5–15.5)
RDW: 13 % (ref 11.5–15.5)
WBC: 18.7 10*3/uL — ABNORMAL HIGH (ref 4.0–10.5)
WBC: 19.7 10*3/uL — ABNORMAL HIGH (ref 4.0–10.5)
nRBC: 0 % (ref 0.0–0.2)
nRBC: 0 % (ref 0.0–0.2)

## 2023-10-22 LAB — COMPREHENSIVE METABOLIC PANEL WITH GFR
ALT: 21 U/L (ref 0–44)
AST: 21 U/L (ref 15–41)
Albumin: 2.8 g/dL — ABNORMAL LOW (ref 3.5–5.0)
Alkaline Phosphatase: 78 U/L (ref 38–126)
Anion gap: 8 (ref 5–15)
BUN: 26 mg/dL — ABNORMAL HIGH (ref 8–23)
CO2: 23 mmol/L (ref 22–32)
Calcium: 7.9 mg/dL — ABNORMAL LOW (ref 8.9–10.3)
Chloride: 104 mmol/L (ref 98–111)
Creatinine, Ser: 1.55 mg/dL — ABNORMAL HIGH (ref 0.61–1.24)
GFR, Estimated: 44 mL/min — ABNORMAL LOW (ref >60)
Glucose, Bld: 118 mg/dL — ABNORMAL HIGH (ref 70–99)
Potassium: 4.3 mmol/L (ref 3.5–5.1)
Sodium: 135 mmol/L (ref 135–145)
Total Bilirubin: 0.6 mg/dL (ref 0.0–1.2)
Total Protein: 6.6 g/dL (ref 6.5–8.1)

## 2023-10-22 LAB — APTT: aPTT: 32 s (ref 24–36)

## 2023-10-22 LAB — GLUCOSE, CAPILLARY
Glucose-Capillary: 113 mg/dL — ABNORMAL HIGH (ref 70–99)
Glucose-Capillary: 118 mg/dL — ABNORMAL HIGH (ref 70–99)
Glucose-Capillary: 130 mg/dL — ABNORMAL HIGH (ref 70–99)
Glucose-Capillary: 140 mg/dL — ABNORMAL HIGH (ref 70–99)
Glucose-Capillary: 163 mg/dL — ABNORMAL HIGH (ref 70–99)
Glucose-Capillary: 174 mg/dL — ABNORMAL HIGH (ref 70–99)

## 2023-10-22 LAB — BASIC METABOLIC PANEL WITH GFR
Anion gap: 10 (ref 5–15)
BUN: 24 mg/dL — ABNORMAL HIGH (ref 8–23)
CO2: 22 mmol/L (ref 22–32)
Calcium: 7.6 mg/dL — ABNORMAL LOW (ref 8.9–10.3)
Chloride: 104 mmol/L (ref 98–111)
Creatinine, Ser: 1.52 mg/dL — ABNORMAL HIGH (ref 0.61–1.24)
GFR, Estimated: 45 mL/min — ABNORMAL LOW (ref >60)
Glucose, Bld: 112 mg/dL — ABNORMAL HIGH (ref 70–99)
Potassium: 4.1 mmol/L (ref 3.5–5.1)
Sodium: 136 mmol/L (ref 135–145)

## 2023-10-22 LAB — LACTIC ACID, PLASMA
Lactic Acid, Venous: 1.3 mmol/L (ref 0.5–1.9)
Lactic Acid, Venous: 1.1 mmol/L (ref 0.5–1.9)

## 2023-10-22 LAB — PROTIME-INR
INR: 1.2 (ref 0.8–1.2)
Prothrombin Time: 15.3 s — ABNORMAL HIGH (ref 11.4–15.2)

## 2023-10-22 MED ORDER — PHENYLEPHRINE 80 MCG/ML (10ML) SYRINGE FOR IV PUSH (FOR BLOOD PRESSURE SUPPORT)
PREFILLED_SYRINGE | INTRAVENOUS | Status: DC | PRN
  Administered 2023-10-22: 160 ug via INTRAVENOUS
  Administered 2023-10-22 (×3): 80 ug via INTRAVENOUS
  Administered 2023-10-22: 160 ug via INTRAVENOUS
  Administered 2023-10-22: 80 ug via INTRAVENOUS
  Administered 2023-10-22: 160 ug via INTRAVENOUS

## 2023-10-22 MED ORDER — LIDOCAINE HCL (CARDIAC) PF 100 MG/5ML IV SOSY
PREFILLED_SYRINGE | INTRAVENOUS | Status: DC | PRN
  Administered 2023-10-22: 100 mg via INTRAVENOUS

## 2023-10-22 MED ORDER — ACETAMINOPHEN 650 MG RE SUPP
650.0000 mg | RECTAL | Status: DC | PRN
  Administered 2023-10-22 – 2023-11-03 (×2): 650 mg via RECTAL
  Filled 2023-10-22 (×2): qty 1

## 2023-10-22 MED ORDER — DROPERIDOL 2.5 MG/ML IJ SOLN: 0.6250 mg | Freq: Once | INTRAMUSCULAR | Status: DC | PRN

## 2023-10-22 MED ORDER — FENTANYL CITRATE (PF) 100 MCG/2ML IJ SOLN
INTRAMUSCULAR | Status: DC | PRN
  Administered 2023-10-22 (×2): 50 ug via INTRAVENOUS

## 2023-10-22 MED ORDER — CHLORHEXIDINE GLUCONATE CLOTH 2 % EX PADS
6.0000 | MEDICATED_PAD | Freq: Every day | CUTANEOUS | Status: DC
  Administered 2023-10-23 – 2023-11-03 (×11): 6 via TOPICAL

## 2023-10-22 MED ORDER — FENTANYL CITRATE (PF) 100 MCG/2ML IJ SOLN: 25.0000 ug | INTRAMUSCULAR | Status: DC | PRN

## 2023-10-22 MED ORDER — PHENYLEPHRINE HCL-NACL 20-0.9 MG/250ML-% IV SOLN
INTRAVENOUS | Status: AC
  Filled 2023-10-22: qty 250

## 2023-10-22 MED ORDER — ONDANSETRON HCL 4 MG/2ML IJ SOLN
INTRAMUSCULAR | Status: DC | PRN
  Administered 2023-10-22: 4 mg via INTRAVENOUS

## 2023-10-22 MED ORDER — FENTANYL CITRATE (PF) 100 MCG/2ML IJ SOLN
INTRAMUSCULAR | Status: AC
  Filled 2023-10-22: qty 2

## 2023-10-22 MED ORDER — ACETAMINOPHEN 10 MG/ML IV SOLN: 1000.0000 mg | Freq: Once | INTRAVENOUS | Status: DC | PRN

## 2023-10-22 MED ORDER — 0.9 % SODIUM CHLORIDE (POUR BTL) OPTIME
TOPICAL | Status: DC | PRN
Start: 2023-10-22 — End: 2023-10-22
  Administered 2023-10-22: 1000 mL
  Administered 2023-10-22: 500 mL

## 2023-10-22 MED ORDER — METHYLENE BLUE (ANTIDOTE) 1 % IV SOLN
INTRAVENOUS | Status: AC
  Filled 2023-10-22: qty 10

## 2023-10-22 MED ORDER — PHENYLEPHRINE HCL-NACL 20-0.9 MG/250ML-% IV SOLN
INTRAVENOUS | Status: DC | PRN
  Administered 2023-10-22: 50 ug/min via INTRAVENOUS

## 2023-10-22 MED ORDER — PROPOFOL 10 MG/ML IV BOLUS
INTRAVENOUS | Status: AC
  Filled 2023-10-22: qty 20

## 2023-10-22 MED ORDER — ROCURONIUM BROMIDE 100 MG/10ML IV SOLN
INTRAVENOUS | Status: DC | PRN
  Administered 2023-10-22 (×2): 20 mg via INTRAVENOUS
  Administered 2023-10-22: 40 mg via INTRAVENOUS
  Administered 2023-10-22: 30 mg via INTRAVENOUS

## 2023-10-22 MED ORDER — SUCCINYLCHOLINE CHLORIDE 200 MG/10ML IV SOSY
PREFILLED_SYRINGE | INTRAVENOUS | Status: DC | PRN
  Administered 2023-10-22: 100 mg via INTRAVENOUS

## 2023-10-22 MED ORDER — DEXAMETHASONE SODIUM PHOSPHATE 10 MG/ML IJ SOLN
INTRAMUSCULAR | Status: DC | PRN
  Administered 2023-10-22: 5 mg via INTRAVENOUS

## 2023-10-22 MED ORDER — PROPOFOL 10 MG/ML IV BOLUS
INTRAVENOUS | Status: DC | PRN
  Administered 2023-10-22: 110 mg via INTRAVENOUS

## 2023-10-22 MED ORDER — METHYLENE BLUE (ANTIDOTE) 1 % IV SOLN
INTRAVENOUS | Status: DC | PRN
  Administered 2023-10-22: 10 mL

## 2023-10-22 MED ORDER — SUGAMMADEX SODIUM 200 MG/2ML IV SOLN
INTRAVENOUS | Status: DC | PRN
  Administered 2023-10-22: 200 mg via INTRAVENOUS

## 2023-10-22 MED ORDER — EPHEDRINE SULFATE-NACL 50-0.9 MG/10ML-% IV SOSY
PREFILLED_SYRINGE | INTRAVENOUS | Status: DC | PRN
  Administered 2023-10-22: 5 mg via INTRAVENOUS

## 2023-10-22 SURGICAL SUPPLY — 47 items
BINDER ABDOMINAL 12 ML 46-62 (SOFTGOODS) IMPLANT
CHLORAPREP W/TINT 26 (MISCELLANEOUS) IMPLANT
DRAIN CHANNEL 19F RND (DRAIN) IMPLANT
DRAPE LAPAROTOMY 100X77 ABD (DRAPES) ×1 IMPLANT
DRSG OPSITE POSTOP 4X10 (GAUZE/BANDAGES/DRESSINGS) IMPLANT
DRSG OPSITE POSTOP 4X8 (GAUZE/BANDAGES/DRESSINGS) IMPLANT
DRSG TEGADERM 4X4.75 (GAUZE/BANDAGES/DRESSINGS) IMPLANT
ELECT BLADE 6.5 EXT (BLADE) ×1 IMPLANT
ELECT CAUTERY BLADE 6.4 (BLADE) IMPLANT
ELECTRODE REM PT RTRN 9FT ADLT (ELECTROSURGICAL) ×1 IMPLANT
EVACUATOR SILICONE 100CC (DRAIN) IMPLANT
G-TUBE MIC 18FR ENFIT ADLT (TUBING) IMPLANT
GLOVE BIOGEL PI IND STRL 7.5 (GLOVE) ×1 IMPLANT
GLOVE SURG SYN 7.0 (GLOVE) ×1 IMPLANT
GLOVE SURG SYN 7.0 PF PI (GLOVE) ×1 IMPLANT
GOWN STRL REUS W/ TWL LRG LVL3 (GOWN DISPOSABLE) ×2 IMPLANT
KIT TURNOVER KIT A (KITS) ×1 IMPLANT
LABEL OR SOLS (LABEL) ×1 IMPLANT
LIGASURE IMPACT 36 18CM CVD LR (INSTRUMENTS) IMPLANT
MANIFOLD NEPTUNE II (INSTRUMENTS) ×1 IMPLANT
NDL HYPO 22X1.5 SAFETY MO (MISCELLANEOUS) ×2 IMPLANT
NS IRRIG 1000ML POUR BTL (IV SOLUTION) ×1 IMPLANT
NS IRRIG 500ML POUR BTL (IV SOLUTION) IMPLANT
PACK BASIN MAJOR ARMC (MISCELLANEOUS) ×1 IMPLANT
PACK COLON CLEAN CLOSURE (MISCELLANEOUS) ×1 IMPLANT
PENCIL SMOKE EVACUATOR COATED (MISCELLANEOUS) IMPLANT
RELOAD PROXIMATE 75MM BLUE (ENDOMECHANICALS) IMPLANT
RELOAD STAPLE 75 3.8 BLU REG (ENDOMECHANICALS) IMPLANT
RETRACTOR WND ALEXIS-O 25 LRG (MISCELLANEOUS) IMPLANT
RETRACTOR WOUND ALXS 18CM MED (MISCELLANEOUS) IMPLANT
SPONGE DRAIN TRACH 4X4 STRL 2S (GAUZE/BANDAGES/DRESSINGS) IMPLANT
SPONGE T-LAP 18X18 ~~LOC~~+RFID (SPONGE) IMPLANT
STAPLER GUN LINEAR PROX 60 (STAPLE) IMPLANT
STAPLER PROXIMATE 75MM BLUE (STAPLE) IMPLANT
STAPLER SKIN PROX 35W (STAPLE) ×1 IMPLANT
SUT PDS AB 0 CT1 27 (SUTURE) IMPLANT
SUT SILK 2 0 SH (SUTURE) IMPLANT
SUT SILK 2-0 18XBRD TIE 12 (SUTURE) IMPLANT
SUT SILK 3 0 SH CR/8 (SUTURE) IMPLANT
SUT VIC AB 3-0 SH 27X BRD (SUTURE) ×1 IMPLANT
SUT VICRYL 3-0 CR8 SH (SUTURE) IMPLANT
SYR 20ML LL LF (SYRINGE) ×2 IMPLANT
SYR BULB IRRIG 60ML STRL (SYRINGE) IMPLANT
TRAP FLUID SMOKE EVACUATOR (MISCELLANEOUS) ×1 IMPLANT
TRAY FOLEY MTR SLVR 16FR STAT (SET/KITS/TRAYS/PACK) ×1 IMPLANT
TUBE GASTRO 18FR ENFIT (TUBING) ×1 IMPLANT
WATER STERILE IRR 500ML POUR (IV SOLUTION) ×1 IMPLANT

## 2023-10-22 NOTE — Progress Notes (Signed)
 OT Cancellation Note  Patient Details Name: Robert Vega MRN: [id number] DOB: [birth year]   Cancelled Treatment:    Reason Eval/Treat Not Completed: Patient at procedure or test/ unavailable. Pt noted to be off the floor for surgery, will hold and require new orders to continue therapy after procedure as appropriate.   Gordan Latina, M.S. OTR/L  10/22/23, 10:03 AM  ascom [phone]

## 2023-10-22 NOTE — Progress Notes (Signed)
 Inpatient Rehab Admissions Coordinator:   Received a denial for appeal with [employer].  They did not provide reasoning.  I updated pt's grand daughter on the phone and we discussed complication this morning with g-tube requiring urgent surgery.  [employer] does not typically consider multiple authorization requests for the same acute stay, however I have had one instance in the last 5 years where a patient had a significant change in status and I was able to get it approved on the second time.  I can continue to follow to see if Robert Vega course may warrant another attempt at prior auth, but [facility] should initiate SNF workup as well in order to not delay discharge once he is ready.   Robert Vega, PT, DPT Admissions Coordinator [phone] 10/22/23  2:21 PM

## 2023-10-22 NOTE — Progress Notes (Signed)
 Called by the bedside nursing staff because the patient has started to become febrile.  He had an axillary temperature of 100.8 and oral temperature of 101.  I came to see the patient on exam he remains hemodynamically within normal limits although his map is 70.  His abdomen continues to be distended and he has worsening pain.  He has some voluntary guarding now with worsening distention.  A.m. labs reviewed and leukocytosis again increased to 19.7 from 18.7 from previous of 7 yesterday.  Unfortunately I do not think that he can wait any longer before we intervene for source control.  My concern is that he has a hole in his stomach causing intra-abdominal sepsis.  With the peritonitis and fevers I think we should move towards the operating room.  I had a discussion with his granddaughter who is his healthcare power of attorney about the findings over the last night and the need for exploratory laparotomy, closure of gastrotomy and possible replacement of G-tube.  I discussed the risk, benefits alternatives of this including risk of infection, bleeding, damage to the other structures as well as inability to get adequate feeding access.  She understands these risks and wishes to proceed with surgery.

## 2023-10-22 NOTE — Brief Op Note (Signed)
 10/22/2023  3:44 PM  PATIENT:  Lyanne Sample  83 y.o. male  PRE-OPERATIVE DIAGNOSIS:  Peritonitis; Gastrostomy  POST-OPERATIVE DIAGNOSIS:  Peritonitis; Gastrostomy  PROCEDURE:  Procedure(s): LAPAROTOMY, EXPLORATORY (N/A) CREATION, GASTROSTOMY, OPEN; GASTROSTOMY CLOSURE (N/A)  SURGEON:  Surgeons and Role:    * Barrett Lick, MD - Primary  PHYSICIAN ASSISTANT:   ASSISTANTS: Gabriela Johns PA-C   ANESTHESIA:   general  EBL:  50 mL   BLOOD ADMINISTERED:none  DRAINS: Gastrostomy Tube   LOCAL MEDICATIONS USED:  NONE  SPECIMEN:  No Specimen  DISPOSITION OF SPECIMEN:  N/A  COUNTS:  YES  TOURNIQUET:  * No tourniquets in log *  DICTATION: .Dragon Dictation  PLAN OF CARE: return to floor  PATIENT DISPOSITION:  PACU - hemodynamically stable.   Delay start of Pharmacological VTE agent (>24hrs) due to surgical blood loss or risk of bleeding: no

## 2023-10-22 NOTE — Progress Notes (Signed)
 PT Cancellation Note  Patient Details Name: Robert Vega MRN: [id number] DOB: 19-May-1941   Cancelled Treatment:    Reason Eval/Treat Not Completed: Patient at procedure or test/unavailable. Pt currently out of room for Sx. Please re-order PT once medically stable.   [name] 10/22/2023, 8:48 AM Amparo Balk, PT, DPT, GCS [phone]

## 2023-10-22 NOTE — Progress Notes (Signed)
 Progress Note   Patient: Robert Vega [id number]:[id number] DOB: 12-30-1940 DOA: 10/05/2023     17 DOS: the patient was seen and examined on 10/22/2023   Brief hospital course: 83 y.o male with significant PMH of OSA, GERD, Obesity, HTN, Dysphagia: EGD 03/2022 with food in upper esophagus complicated by aspiration event, cardiac arrest with round of CPR, and post resuscitation EGD with concern for lack of peristalsis. Prior EGD 08/2020 with note of abnormal cricopharyngeus, decrease in motility in esophagus, and spastic LES who presented to the ED from with hypoxia, fever and generalized weakness.  Patient developed acute respiratory failure requiring intubation due to aspiration pneumonia, he was treated with broad-spectrum antibiotics and managed in ICU. Condition had improved, extubated 5/15, but has significant agitation required brief course of Precedex . 5/10: Admit to [facility] service with sepsis due to Aspiration Pneumonia.  Course complicated by Acute Respiratory Failure due to Aspiration of vomitus requiring s/p cardiac arrest  transfer to ICU and intubation.  5/11 flexible bronchoscopy done by critical care team 5/12 remains intubated 5/13 remains on vent, s/p PEG tube, +vomiting last night, failed SAT/SBT 5/14 extubated successfully but with severe delirium 5/20.  Botulinum toxin injection into the lower esophageal sphincter by Dr. Ole Berkeley  5/21 esophagram showing diffusely distended esophagus with distal obstruction and severe dysmotility suggestive of achalasia. 5/22.  Advised I would not give him any food and will continue PEG feeding he can try liquids to see if that goes down but still high risk for aspiration.  Patient had a fall on the evening of 5/22. 5/23.  Patient feels okay.  Awaken from sleep.  Did not offer any complaints.  Awaiting insurance authorization for rehab. 5/24.  Patient feeling okay.  States he is doing okay with the liquid diet.  Still feels weak. 5/25.  Patient pulled out  PEG tube this morning.  Foley catheter placed in the stoma.  GI able to place a another PEG tube and remove the Foley. 5/26.  Creatinine up at 1.83.  Continue IV fluid hydration.  Patient started having pain after tube feeding started.  CT scan showed that the PEG tube was not in the correct place.  Notified gastroenterology.  Started empiric antibiotics. 5/27.  Patient was taken urgently to the operating room by Dr. Cornel Diesel because the patient had fever and elevated white count with suspected intra-abdominal sepsis.   Assessment and Plan: * Severe sepsis (HCC) Recurrence with different problem.  Intra-abdominal infection secondary to PEG tube not being in the right place.  Elevated white count and fever and acute kidney injury.  Patient brought emergently to the operating room on 5/27 by Dr. Cornel Diesel.  Currently NPO.  Empirically on Zosyn .  (Initial septic shock was present on admission with acute hypoxic respiratory failure, pneumonia bilateral lower lobes.  Patient completed antibiotics).  Gastrostomy complication (HCC) Patient pulled out PEG tube.  I placed initial Foley catheter in PEG site and it did get drainage for what look like gastric contents on 5/25.  PEG tube replaced by gastroenterology on 5/25.  When starting tube feeds on 5/26 patient had severe pain.  Tube feeds were stopped.  CT scan showing contrast in the intra-abdominal space and showing PEG tube was not in correct place.  Patient was placed on empiric antibiotics.  AKI (acute kidney injury) (HCC) Creatinine went up to 1.83 on 5/26.  Continue IV fluids.  Creatinine down to 1.52.  Achalasia of esophagus Status post botulinum toxin injection on 5/20.  Esophagram showing dilated  esophagus distal obstruction.  Dysphagia Likely secondary to achalasia.  Patient had Botox injection of the lower esophageal sphincter due to lower esophageal sphincter hypertension causing the patient not being able to swallow and empty the esophagus of  his ingested food.  Food was in the esophagus on the previous EGD.  With barium swallow study results, I do not want to give him any food.  Do not advance diet to more than clear liquids.   Acute delirium Holding Seroquel  until we can use the tube.  Haldol  as needed.  Likely sundown's in the late afternoon early evening.  Discontinued Reglan .  Acute respiratory failure with hypoxia and hypercapnia (HCC) Pneumonia Patient now on room air  Essential hypertension, benign Hold Norvasc   GERD (gastroesophageal reflux disease) PPI   Generalized weakness Patient had a fall the other night.  Patient still needs hand-held assistance with 1 person in order to ambulate.  Patient is unsteady and staggers with the physical therapy team.  Will order physical therapy for tomorrow.  Obesity (BMI 30-39.9) BMI 33.16        Subjective: Patient seen in the PACU and was sleeping.  Patient was extubated after procedure.  Brought urgently to the operating room for sepsis secondary to PEG tube not being in the stomach and in the peritoneal cavity.  Physical Exam: Vitals:   10/22/23 1021 10/22/23 1030 10/22/23 1045 10/22/23 1120  BP:  138/75 (!) 141/83 129/61  Pulse: 85 83 84 83  Resp: (!) 27 (!) 26 (!) 25 16  Temp:  (!) 97 F (36.1 C) 98 F (36.7 C) 98.7 F (37.1 C)  TempSrc:      SpO2: 98% 92% 94% 93%  Weight:      Height:       Physical Exam HENT:     Head: Normocephalic.  Eyes:     General: Lids are normal.     Conjunctiva/sclera: Conjunctivae normal.  Cardiovascular:     Rate and Rhythm: Normal rate and regular rhythm.     Heart sounds: Normal heart sounds, S1 normal and S2 normal.  Pulmonary:     Breath sounds: Examination of the right-lower field reveals decreased breath sounds. Examination of the left-lower field reveals decreased breath sounds. Decreased breath sounds present. No wheezing, rhonchi or rales.  Abdominal:     General: Bowel sounds are decreased.     Palpations:  Abdomen is soft.     Tenderness: There is no abdominal tenderness.  Musculoskeletal:     Right lower leg: No swelling.     Left lower leg: No swelling.  Skin:    General: Skin is warm.     Findings: No rash.  Neurological:     Mental Status: He is lethargic.     Data Reviewed: Creatinine 1.52, lactic acid 1.1, white blood count 19.7, hemoglobin 11.0, platelet count 286  Family Communication: Spoke with granddaughter on the phone this morning and after I saw him late morning.  Disposition: Status is: Inpatient Remains inpatient appropriate because: Had a emergent operation this morning will likely need 5 days postoperatively here in the hospital.  Planned Discharge Destination: Awaiting appeal on acute inpatient rehab.    Time spent: 30 minutes  Author: Verla Glaze, MD 10/22/2023 12:17 PM  For on call review [email or web address].

## 2023-10-22 NOTE — Transfer of Care (Signed)
 Immediate Anesthesia Transfer of Care Note  Patient: Robert Vega  Procedure(s) Performed: LAPAROTOMY, EXPLORATORY (Abdomen) CREATION, GASTROSTOMY, OPEN; GASTROSTOMY CLOSURE (Abdomen)  Patient Location: PACU  Anesthesia Type:General  Level of Consciousness: drowsy  Airway & Oxygen Therapy: Patient Spontanous Breathing and Patient connected to face mask oxygen  Post-op Assessment: Report given to RN and Post -op Vital signs reviewed and stable  Post vital signs: Reviewed and stable  Last Vitals:  Vitals Value Taken Time  BP 139/69   Temp    Pulse 82 10/22/23 1010  Resp 28 10/22/23 1010  SpO2 98 % 10/22/23 1010  Vitals shown include unfiled device data.  Last Pain:  Vitals:   10/22/23 0635  TempSrc: Temporal  PainSc: 0-No pain         Complications: No notable events documented.

## 2023-10-22 NOTE — Anesthesia Procedure Notes (Signed)
 Procedure Name: Intubation Date/Time: 10/22/2023 7:00 AM  Performed by: Bobette Burrs, CRNAPre-anesthesia Checklist: Patient identified, Emergency Drugs available, Suction available and Patient being monitored Patient Re-evaluated:Patient Re-evaluated prior to induction Oxygen Delivery Method: Circle system utilized Preoxygenation: Pre-oxygenation with 100% oxygen Induction Type: IV induction, Rapid sequence and Cricoid Pressure applied Laryngoscope Size: McGrath and 3 Grade View: Grade II Tube type: Oral Tube size: 7.5 mm Number of attempts: 1 Airway Equipment and Method: Stylet, Oral airway and Bite block Placement Confirmation: ETT inserted through vocal cords under direct vision, positive ETCO2 and breath sounds checked- equal and bilateral Secured at: 21 cm Tube secured with: Tape Dental Injury: Teeth and Oropharynx as per pre-operative assessment

## 2023-10-22 NOTE — Anesthesia Preprocedure Evaluation (Addendum)
 Anesthesia Evaluation  Patient identified by MRN, date of birth, ID band Patient awake    Reviewed: Allergy & Precautions, H&P , NPO status , Patient's Chart, lab work & pertinent test results  Airway Mallampati: II  TM Distance: >3 FB Neck ROM: full    Dental no notable dental hx.    Pulmonary former smoker   Pulmonary exam normal        Cardiovascular hypertension, Normal cardiovascular exam  ECHO 10/06/2023:  1. Left ventricular ejection fraction, by estimation, is 55 to 60%. The  left ventricle has normal function. The left ventricle has no regional  wall motion abnormalities. Left ventricular diastolic parameters were  normal.   2. Right ventricular systolic function is normal. The right ventricular  size is normal.   3. The mitral valve is normal in structure. Mild mitral valve  regurgitation.   4. The aortic valve is normal in structure. There is mild thickening of  the aortic valve. Aortic valve regurgitation is not visualized.   5. The inferior vena cava is dilated in size with <50% respiratory  variability, suggesting right atrial pressure of 15 mmHg.     Neuro/Psych negative neurological ROS  negative psych ROS   GI/Hepatic negative GI ROS, Neg liver ROS,,,  Endo/Other  negative endocrine ROS    Renal/GU Renal InsufficiencyRenal disease     Musculoskeletal   Abdominal  (+) + obese  Peds  Hematology  (+) Blood dyscrasia, anemia   Anesthesia Other Findings Sepsis with peritonitis s/p gtube placement with complications. Pt is confused and mumbles. He knows his date of birth but does not answer other questions directly. He is tachypneic and tender to palpation in abdomen. He denies any other complaints currently. He has elevated creatinine and leukocytosis.    Septic shock (HCC) Present on admission with acute hypoxic respiratory failure, pneumonia bilateral lower lobes.  Patient completed antibiotics.    Acute respiratory failure with hypoxia and hypercapnia (HCC) Pneumonia Patient now on room air   Past Medical History: No date: Allergy No date: Arthritis No date: BPH (benign prostatic hypertrophy) No date: Colonic polyp No date: GERD (gastroesophageal reflux disease)     Comment:  Has LPR No date: Hypertension  Past Surgical History: No date: CATARACT EXTRACTION     Comment:  2011, other in 2013 2011: COLONOSCOPY 10/15/2023: ESOPHAGOGASTRODUODENOSCOPY; N/A     Comment:  Procedure: EGD (ESOPHAGOGASTRODUODENOSCOPY);  Surgeon:               Marnee Sink, MD;  Location: [facility] ENDOSCOPY;  Service:               Endoscopy;  Laterality: N/A;  WILL NEED BOTOX  08/30/2020: ESOPHAGOGASTRODUODENOSCOPY (EGD) WITH PROPOFOL ; N/A     Comment:  Procedure: ESOPHAGOGASTRODUODENOSCOPY (EGD) WITH               PROPOFOL ;  Surgeon: Luke Salaam, MD;  Location: [facility]               ENDOSCOPY;  Service: Gastroenterology;  Laterality: N/A; 04/02/2022: ESOPHAGOGASTRODUODENOSCOPY (EGD) WITH PROPOFOL ; N/A     Comment:  Procedure: ESOPHAGOGASTRODUODENOSCOPY (EGD) WITH               PROPOFOL ;  Surgeon: Luke Salaam, MD;  Location: [facility]               ENDOSCOPY;  Service: Gastroenterology;  Laterality: N/A; 10/08/2023: PEG PLACEMENT; N/A     Comment:  Procedure: INSERTION, PEG TUBE;  Surgeon: Marnee Sink,  MD;  Location: [facility] ENDOSCOPY;  Service: Endoscopy;                Laterality: N/A; No date: skin cancer removal  BMI    Body Mass Index: 33.16 kg/m      Reproductive/Obstetrics negative OB ROS                              Anesthesia Physical Anesthesia Plan  ASA: 4 and emergent  Anesthesia Plan: General ETT   Post-op Pain Management: Ofirmev  IV (intra-op)* and Regional block*   Induction: Intravenous  PONV Risk Score and Plan: 2 and Ondansetron  and Dexamethasone  Airway Management Planned: Oral ETT  Additional Equipment:   Intra-op Plan:    Post-operative Plan: Extubation in OR  Informed Consent: I have reviewed the patients History and Physical, chart, labs and discussed the procedure including the risks, benefits and alternatives for the proposed anesthesia with the patient or authorized representative who has indicated his/her understanding and acceptance.     Dental Advisory Given and Consent reviewed with POA  Plan Discussed with: CRNA and Surgeon  Anesthesia Plan Comments:          Anesthesia Quick Evaluation

## 2023-10-22 NOTE — Consult Note (Addendum)
 Update: Second CT done and there is still concern that the gastrostomy tube is not within the lumen of the stomach.  I went and rechecked on the patient.  His blood pressure is actually better with a MAP of 70.  His heart rate has come down into the mid 80s.  He is on room air and able to sleep.  He is still distended on exam but no worsening in pain and does not wake up until deep palpation of his abdomen.  He has gotten 1 Vega of fluid.  At this time I would like to continue resuscitation.  If he is hemodynamically within normal limits will discuss with GI in the morning if there is any endoscopic options such as clipping that may be able to be performed over a more invasive operative procedure.  I discussed this with the bedside nurse.  Continue on IV fluids for resuscitation.  If there is worsening in hemodynamic status will need to go to the operating room tonight.  Patient ID: Robert Vega, male   DOB: Sep 27, 1940, 83 y.o.   MRN: [id number] CC: Gastrostomy tube Complication History of Present Illness Robert Vega is a 83 y.o. male with past medical history as below who presents in consultation for gastrostomy tube complication.  The patient has had a prolonged hospital stay secondary to acute respiratory failure that he has now recovered from and then dysphagia.  On May 13 he underwent a percutaneous endoscopic gastrostomy tube placement.  He was also noted to have achalasia and had botulism toxin injected into his esophagus.  He was tolerating feeds and doing well until 2 days ago when it was noted that his gastrostomy tube had come out.  It was immediately replaced with a Foley catheter.  GI was reconsulted and the Foley was replaced with a standard G-tube.  Tube feeds were not started yesterday but were started this morning and they were on for about an hour when the patient started reporting increased abdominal pain.  Tube feeds were stopped.  CT abdomen and pelvis was obtained that showed that the  G-tube was not within the stomach and there was extravasation of contrast that was placed into the G-tube.  I was consulted at that time and the patient was doing okay so we elected to see how he did overnight.  However, he has become intermittently tachycardic with decreasing blood pressure so I came in and evaluated the patient.  He is not alert and so cannot provide any history.  The nurse at the bedside says that that his mental status is decreased from the previous nights.  There has been no drainage from around the tube.  He does look a little bit more distended per the bedside nurse.Robert Vega  Past Medical History Past Medical History:  Diagnosis Date   Allergy    Arthritis    BPH (benign prostatic hypertrophy)    Colonic polyp    GERD (gastroesophageal reflux disease)    Has LPR   Hypertension        Past Surgical History:  Procedure Laterality Date   CATARACT EXTRACTION     2011, other in 2013   COLONOSCOPY  2011   ESOPHAGOGASTRODUODENOSCOPY N/A 10/15/2023   Procedure: EGD (ESOPHAGOGASTRODUODENOSCOPY);  Surgeon: Robert Sink, MD;  Location: [facility] ENDOSCOPY;  Service: Endoscopy;  Laterality: N/A;  WILL NEED BOTOX   ESOPHAGOGASTRODUODENOSCOPY (EGD) WITH PROPOFOL  N/A 08/30/2020   Procedure: ESOPHAGOGASTRODUODENOSCOPY (EGD) WITH PROPOFOL ;  Surgeon: Robert Salaam, MD;  Location: [facility] ENDOSCOPY;  Service: Gastroenterology;  Laterality: N/A;   ESOPHAGOGASTRODUODENOSCOPY (EGD) WITH PROPOFOL  N/A 04/02/2022   Procedure: ESOPHAGOGASTRODUODENOSCOPY (EGD) WITH PROPOFOL ;  Surgeon: Robert Salaam, MD;  Location: [facility] ENDOSCOPY;  Service: Gastroenterology;  Laterality: N/A;   PEG PLACEMENT N/A 10/08/2023   Procedure: INSERTION, PEG TUBE;  Surgeon: Robert Sink, MD;  Location: [facility] ENDOSCOPY;  Service: Endoscopy;  Laterality: N/A;   skin cancer removal      Allergies  Allergen Reactions   Sulfonamide Derivatives     Current Facility-Administered Medications  Medication Dose Route Frequency Provider Last  Rate Last Admin   acetaminophen  (TYLENOL ) tablet 650 mg  650 mg Per Tube Q6H PRN Robert Vega, Robert L, NP   650 mg at 10/13/23 2215   bisacodyl  (DULCOLAX) suppository 10 mg  10 mg Rectal Daily PRN Robert Frizzle, MD   10 mg at 10/12/23 0919   dextrose  5 % in lactated ringers  infusion   Intravenous Continuous Robert Glaze, MD 100 mL/hr at 10/21/23 2316 New Bag at 10/21/23 2316   gentamicin ointment (GARAMYCIN) 0.1 %   Topical TID Robert Glaze, MD       haloperidol  lactate (HALDOL ) injection 1 mg  1 mg Intravenous Q4H PRN Robert Glaze, MD   1 mg at 10/21/23 1529   hydrALAZINE  (APRESOLINE ) injection 10 mg  10 mg Intravenous Q6H PRN Robert Sink, MD   10 mg at 10/11/23 4098   insulin  aspart (novoLOG ) injection 0-9 Units  0-9 Units Subcutaneous Q4H Robert Sink, MD   1 Units at 10/21/23 1648   ipratropium-albuterol  (DUONEB) 0.5-2.5 (3) MG/3ML nebulizer solution 3 mL  3 mL Nebulization Q4H PRN Robert Frizzle, MD       lactated ringers  infusion  150 mL/hr Intravenous Continuous Robert Pion, MD       OLANZapine (ZYPREXA) injection 10 mg  10 mg Intramuscular QHS PRN Robert Glaze, MD       ondansetron  (ZOFRAN ) injection 4 mg  4 mg Intravenous Q6H PRN Robert Sink, MD   4 mg at 10/05/23 1713   Oral care mouth rinse  15 mL Mouth Rinse PRN Robert Vega, Kurian, MD   15 mL at 10/12/23 1600   oxyCODONE -acetaminophen  (PERCOCET/ROXICET) 5-325 MG per tablet 1 tablet  1 tablet Per Tube Q6H PRN Robert Frizzle, MD       pantoprazole  (PROTONIX ) injection 40 mg  40 mg Intravenous Q24H Robert Sink, MD   40 mg at 10/21/23 1122   piperacillin -tazobactam (ZOSYN ) IVPB 3.375 g  3.375 g Intravenous Q8H Robert Vega, Robert Vega 12.5 mL/hr at 10/21/23 2325 3.375 g at 10/21/23 2325   sodium chloride  0.9 % bolus 1,000 mL  1,000 mL Intravenous Once Robert Pion, MD 999 mL/hr at 10/22/23 0040 1,000 mL at 10/22/23 0040    Family History Family History  Problem Relation Age of Onset   Hypertension Mother    Heart  disease Neg Hx    Diabetes Neg Hx    Cancer Neg Hx        Social History Social History   Tobacco Use   Smoking status: Former    Current packs/day: 0.00    Average packs/day: 2.0 packs/day for 50.0 years (100.0 ttl pk-yrs)    Types: Pipe, Cigarettes    Start date: 10/19/1964    Quit date: 10/20/2014    Years since quitting: 9.0    Passive exposure: Past   Smokeless tobacco: Never   Tobacco comments:    pipe   Vaping Use   Vaping status: Never Used  Substance Use  Topics   Alcohol use: No    Alcohol/week: 0.0 standard drinks of alcohol   Drug use: No        ROS Full ROS of systems performed and is otherwise negative there than what is stated in the HPI  Physical Exam Blood pressure (!) 101/57, pulse 84, temperature 99.4 F (37.4 C), temperature source Oral, resp. rate (!) 21, height 5' 9.02" (1.753 m), weight 101.9 kg, SpO2 93%.  Patient is not alert or a oriented, he is tachypneic but remains on room air with sats in the lower 90s to mid 90s.  Most of the exam he was nontachycardic but he did go up into the low 100s intermittently.  His abdomen is distended with some tenderness in the left upper quadrant around the G-tube.  There is tympany.  There is no rigidity, rebound tenderness are involuntary guarding.  He has a G-tube that is in place.  I deflated the balloon of the G-tube and completely removed it.  I then tried to insert it into through the tract.  This was quite easy and I inserted it quite deep into the tract.  The balloon was then insufflated and I pulled back into the bumper was approximately 3 cm at the skin which is where it was when it was placed.  Data Reviewed I independently reviewed his labs that are notable for a leukocytosis of 18,000 without any significant anemia.  I also independently reviewed his abdominal x-ray as well as his recent CT abdomen and pelvis.  The CT abdomen pelvis shows that there is a bumper that is not within the stomach lumen with  contrast extravasation.  I also independently reviewed his abdominal x-rays that I was present for when contrast was injected into the reposition tube.  It does look like contrast extends into the duodenal bulb but there is some concern for extravasation.  After independently reviewing the x-rays I also reviewed this with the radiologist.  She recommended repeating a CT abdomen and pelvis without contrast.  She is thinking that it is likely that the PEG tube is now within the stomach lumen but it is difficult to tell on a flat x-ray and we have no fluoroscopy at night.  I have personally reviewed the patient's imaging and medical records.    Assessment/Plan    83 year old male with achalasia and dysphagia who presents in consultation for gastrostomy tube complication.  It was originally removed and replaced with a Foley after it came out.  It was then placed with a standard G-tube but this was found to not be within the gastric lumen this evening.  I rereplaced the G-tube and shot a KUB with contrast.  This does look like it is in the stomach lumen but the radiologist was not 100% certain so we will plan for a CT abdomen pelvis again to evaluate the location of the G-tube.  If it is not within the stomach lumen then he will need operative intervention with an exploratory laparotomy and closure of the gastrotomy as well as for placement of a G-tube site.  For now keep patient n.p.o. and we will give him a bolus of fluids.  We have also started him on broad-spectrum antibiotics.  I will closely follow-up his CT abdomen and pelvis to assess the location of the balloon  A total of 80 minutes was spent providing patient care through chart review, performing history and physical, overseeing radiologic procedures and interpreting radiologic procedures and devising a treatment plan with  the patient and nursing staff   Barrett Lick 10/22/2023, 1:14 AM

## 2023-10-23 ENCOUNTER — Encounter: Payer: Self-pay | Admitting: General Surgery

## 2023-10-23 DIAGNOSIS — A419 Sepsis, unspecified organism: Secondary | ICD-10-CM | POA: Diagnosis not present

## 2023-10-23 DIAGNOSIS — R6520 Severe sepsis without septic shock: Secondary | ICD-10-CM | POA: Diagnosis not present

## 2023-10-23 LAB — GLUCOSE, CAPILLARY
Glucose-Capillary: 124 mg/dL — ABNORMAL HIGH (ref 70–99)
Glucose-Capillary: 109 mg/dL — ABNORMAL HIGH (ref 70–99)
Glucose-Capillary: 109 mg/dL — ABNORMAL HIGH (ref 70–99)
Glucose-Capillary: 121 mg/dL — ABNORMAL HIGH (ref 70–99)
Glucose-Capillary: 112 mg/dL — ABNORMAL HIGH (ref 70–99)

## 2023-10-23 LAB — CBC
HCT: 29.1 % — ABNORMAL LOW (ref 39.0–52.0)
Hemoglobin: 9.9 g/dL — ABNORMAL LOW (ref 13.0–17.0)
MCH: 32.4 pg (ref 26.0–34.0)
MCHC: 34 g/dL (ref 30.0–36.0)
MCV: 95.1 fL (ref 80.0–100.0)
Platelets: 273 10*3/uL (ref 150–400)
RBC: 3.06 MIL/uL — ABNORMAL LOW (ref 4.22–5.81)
RDW: 12.9 % (ref 11.5–15.5)
WBC: 12.6 10*3/uL — ABNORMAL HIGH (ref 4.0–10.5)
nRBC: 0 % (ref 0.0–0.2)

## 2023-10-23 LAB — COMPREHENSIVE METABOLIC PANEL WITH GFR
ALT: 17 U/L (ref 0–44)
AST: 17 U/L (ref 15–41)
Albumin: 2.4 g/dL — ABNORMAL LOW (ref 3.5–5.0)
Alkaline Phosphatase: 62 U/L (ref 38–126)
Anion gap: 10 (ref 5–15)
BUN: 24 mg/dL — ABNORMAL HIGH (ref 8–23)
CO2: 24 mmol/L (ref 22–32)
Calcium: 7.9 mg/dL — ABNORMAL LOW (ref 8.9–10.3)
Chloride: 103 mmol/L (ref 98–111)
Creatinine, Ser: 1.63 mg/dL — ABNORMAL HIGH (ref 0.61–1.24)
GFR, Estimated: 41 mL/min — ABNORMAL LOW (ref >60)
Glucose, Bld: 125 mg/dL — ABNORMAL HIGH (ref 70–99)
Potassium: 3.9 mmol/L (ref 3.5–5.1)
Sodium: 137 mmol/L (ref 135–145)
Total Bilirubin: 0.8 mg/dL (ref 0.0–1.2)
Total Protein: 6.3 g/dL — ABNORMAL LOW (ref 6.5–8.1)

## 2023-10-23 MED ORDER — OSMOLITE 1.5 CAL PO LIQD
1000.0000 mL | ORAL | Status: DC
  Administered 2023-10-23: 1000 mL

## 2023-10-23 MED ORDER — FREE WATER
30.0000 mL | Status: DC
  Administered 2023-10-23 – 2023-10-26 (×17): 30 mL

## 2023-10-23 NOTE — Op Note (Signed)
 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  On 2 days prior to operation and 10 days post PEG tube placement is PEG tube was pulled out.  This was replaced and he was started on tube feeds the next day.  However he had worsening pain with tube feeds and a CT scan showed that the PEG tube was in the abdomen.  An attempt was made to replace the PEG tube again but there was an intra-abdominal placement again.  He had worsening hypotension with fevers so the decision was made to take him to the operating room for exploratory laparotomy.  After informed consent was obtained the patient was brought to the operating room placed supine on the operating room table.  General intraocular anesthesia was then induced and his abdomen was then prepped and draped in the usual sterile fashion.  The midline was identified and an upper midline incision was made and taken through the subcutaneous tissue with Bovie cautery.  The fascia was identified and opened with cautery.  The peritoneum was grasped and opened with Metzenbaum scissors.  There was some flecks of murky fluid.  In the upper abdomen the stomach was loosely adhered to the abdominal wall where the prior G-tube had been placed.  This was easily dissected off of the anterior abdominal wall with blunt dissection.  There was inflammatory tissue on the anterior surface of the stomach  as well as to the colon that was all able to be dissected off bluntly.  The gastrocolic ligament had been partially obliterated with some inflammatory exudate throughout this.  It appeared that the omentum had tried to start to seal the hole.  The omentum was eviscerated and the transverse colon was identified.  The transverse colon appeared healthy without any evidence of through and through injury.  I did further open the lesser sac and there was some staining on the posterior part of the stomach.  The anterior stomach was evaluated and it was difficult to find where the gastrotomy was.  Due to this I asked our anesthesia colleagues to instill IV methylene blue within the stomach but there was no definitive leak.  After examining the stomach again I was able to find the pinpoint hole where the previous gastrotomy tube had gone through.  There was some partial epithelialization adjacent to this that was excised with Bovie cautery leaving a small pinpoint hole in the stomach.  I elected to repair this primarily.  It was closed with 3-0 Vicryl suture.  I also oversewed this with 3-0 Vicryl suture.  I again examined the anterior and posterior part of the stomach to make sure that there was no other evidence of injury.  I also eviscerated some of the small bowel.  The proximal small bowel did have some fibrinous exudate on it but there was no evidence of ischemia or necrosis of the small bowel.  This was run distally to about the mid jejunum when the small bowel looked completely decompressed and normal.  It was placed back into the abdominal cavity.  The transverse colon was also placed back into the abdominal cavity.  I then elected to perform a gastrostomy tube at a separate site from his previous gastrostomy.  A site was selected on the anterior body of the stomach.  I also selected a site in the left upper quadrant for it to be externalized.  2 silk suture pursestrings were placed at the site of her gastrostomy  tube insertion site.  A gastrotomy was made and an 2 French MIC type was inserted into this gastrotomy.  The balloon was inflated and the silk sutures pursestrings were tied down to secure the tube to the stomach.  The stomach was then sewn to the anterior abdominal wall and a series of silk sutures surrounding the G-tube.  The G-tube was then aspirated and we were able to get back blue liquid consistent with methylene blue infiltration of the stomach.  The G-tube was flushed and there was no evidence of spillage of fluid when it was flushed.  The abdomen was then copiously irrigated with warm saline solution.  The fascia was then closed with 0 PDS running from superior and inferior and it was tied together in the middle.  The skin was closed with staples and dressed with a sterile dressing.  A drain sponge was placed at the site of the G-tube and it was at approximately 4 cm at the bumper.  The patient was then dressed with an abdominal binder and extubated and taken to the PACU in stable condition.  Prior to termination of the procedure all sponge and instrument counts were correct x 2.

## 2023-10-23 NOTE — Evaluation (Signed)
 Physical Therapy Evaluation Patient Details Name: Robert Vega MRN: [id number] DOB: December 01, 1940 Today's Date: 10/23/2023  History of Present Illness  83 y.o male with significant PMH of OSA, GERD, Obesity, HTN, Dysphagia: EGD 03/2022 with food in upper esophagus complicated by aspiration event, cardiac arrest with round of CPR, and post resuscitation EGD with concern for lack of peristalsis. Pt presented to ED on 10/05/2023 with hypoxia, fever and generalized weakness; developed acute respiratory failure requiring intubation 5/10 due to aspiration pneumonia, extubated 5/15, but had significant agitation required brief course of Precedex ; pt now s/p exploratory laparotomy with closure of gastrotomy and insertion of gastrostomy tube on 10/22/23   Clinical Impression  Patient received in bed, sitter in room. Mitt donned on R UE. Patient is confused, HOH. He agrees to PT/OT assessment. Patient requires mod +2 assist with bed mobility, min +2 for sit to stand and min+2 for ambulation with RW 160 feet. Patient requires multimodal cues for safety during mobility due to confusion. He will continue to benefit from skilled PT to improve independence and safety.           If plan is discharge home, recommend the following: A lot of help with walking and/or transfers;A lot of help with bathing/dressing/bathroom;Help with stairs or ramp for entrance;Assist for transportation;Direct supervision/assist for financial management;Assistance with cooking/housework;Direct supervision/assist for medications management   Can travel by private vehicle   Yes    Equipment Recommendations Other (comment) (TBD)  Recommendations for Other Services       Functional Status Assessment Patient has had a recent decline in their functional status and/or demonstrates limited ability to make significant improvements in function in a reasonable and predictable amount of time     Precautions / Restrictions  Precautions Precautions: Fall Recall of Precautions/Restrictions: Impaired Precaution/Restrictions Comments: abdominal binder Restrictions Weight Bearing Restrictions Per Provider Order: No      Mobility  Bed Mobility Overal bed mobility: Needs Assistance Bed Mobility: Rolling, Sidelying to Sit Rolling: Mod assist, Used rails, +2 for physical assistance Sidelying to sit: Mod assist, HOB elevated, Used rails, +2 for physical assistance       General bed mobility comments: attempted log roll but ultimately pt performed supine>sit, step by step multi modal cues for technique    Transfers Overall transfer level: Needs assistance Equipment used: Rolling walker (2 wheels) Transfers: Sit to/from Stand Sit to Stand: Min assist, +2 safety/equipment                Ambulation/Gait Ambulation/Gait assistance: Min assist, +2 physical assistance, +2 safety/equipment Gait Distance (Feet): 160 Feet Assistive device: Rolling walker (2 wheels) Gait Pattern/deviations: Step-through pattern, Decreased step length - right, Decreased step length - left, Decreased stride length, Trunk flexed Gait velocity: decreased     General Gait Details: Cues needed for safe use of AD. Staying within RW, keeping walker close.  Stairs            Wheelchair Mobility     Tilt Bed    Modified Rankin (Stroke Patients Only)       Balance Overall balance assessment: Needs assistance Sitting-balance support: Feet supported Sitting balance-Leahy Scale: Good     Standing balance support: Bilateral upper extremity supported, During functional activity, Reliant on assistive device for balance Standing balance-Leahy Scale: Fair                               Pertinent Vitals/Pain Pain Assessment Pain Assessment:  Faces Faces Pain Scale: Hurts little more Pain Location: abdomen with mobility Pain Intervention(s): Monitored during session, Repositioned    Home Living  Family/patient expects to be discharged to:: Private residence Living Arrangements: Alone Available Help at Discharge: Family;Available 24 hours/day Type of Home: House Home Access: Stairs to enter   [employer] of Steps: 2   Home Layout: One level Home Equipment: None Additional Comments: home setup per granddaughter in room. reports pt has girlfriend who could stay 24/7 or pt could go to her house.    Prior Function Prior Level of Function : Independent/Modified Independent;History of Falls (last six months)             Mobility Comments: endorses falls hx, has fallen in garden and on front steps       Extremity/Trunk Assessment   Upper Extremity Assessment Upper Extremity Assessment: Generalized weakness    Lower Extremity Assessment Lower Extremity Assessment: Generalized weakness    Cervical / Trunk Assessment Cervical / Trunk Assessment: Normal  Communication   Communication Communication: Impaired Factors Affecting Communication: Reduced clarity of speech    Cognition Arousal: Alert Behavior During Therapy: Impulsive   PT - Cognitive impairments: No family/caregiver present to determine baseline   Orientation impairments: Time, Situation                   PT - Cognition Comments: patient confused, requires multimodal cues for safety, attending to task. Following commands: Impaired Following commands impaired: Follows one step commands inconsistently, Follows one step commands with increased time     Cueing Cueing Techniques: Verbal cues, Gestural cues, Tactile cues     General Comments General comments (skin integrity, edema, etc.): vss throughout, abdominal binder in place pre/post session, sitter in room at end of session; R mitt donned    Exercises     Assessment/Plan    PT Assessment Patient needs continued PT services  PT Problem List Decreased activity tolerance;Decreased balance;Decreased mobility;Decreased  coordination;Decreased cognition;Decreased safety awareness;Decreased knowledge of use of DME;Pain       PT Treatment Interventions Functional mobility training;Therapeutic activities;Therapeutic exercise;Balance training;Cognitive remediation;Patient/family education;Gait training;DME instruction    PT Goals (Current goals can be found in the Care Plan section)  Acute Rehab PT Goals Patient Stated Goal: none stated PT Goal Formulation: Patient unable to participate in goal setting Time For Goal Achievement: 11/06/23 Potential to Achieve Goals: Fair    Frequency Min 3X/week     Co-evaluation PT/OT/SLP Co-Evaluation/Treatment: Yes Reason for Co-Treatment: To address functional/ADL transfers;Necessary to address cognition/behavior during functional activity;For patient/therapist safety PT goals addressed during session: Mobility/safety with mobility;Balance;Proper use of DME OT goals addressed during session: ADL's and self-care       AM-PAC PT "6 Clicks" Mobility  Outcome Measure Help needed turning from your back to your side while in a flat bed without using bedrails?: A Lot Help needed moving from lying on your back to sitting on the side of a flat bed without using bedrails?: A Lot Help needed moving to and from a bed to a chair (including a wheelchair)?: A Lot Help needed standing up from a chair using your arms (e.g., wheelchair or bedside chair)?: A Little Help needed to walk in hospital room?: A Lot Help needed climbing 3-5 steps with a railing? : A Lot 6 Click Score: 13    End of Session Equipment Utilized During Treatment: Gait belt Activity Tolerance: Patient tolerated treatment well Patient left: in chair;with call bell/phone within reach;with chair alarm set;with nursing/sitter  in room Nurse Communication: Mobility status PT Visit Diagnosis: Unsteadiness on feet (R26.81);Repeated falls (R29.6);Muscle weakness (generalized) (M62.81);Difficulty in walking, not  elsewhere classified (R26.2);Other abnormalities of gait and mobility (R26.89);Pain Pain - part of body:  (abdomen)    Time: 0930-1000 PT Time Calculation (min) (ACUTE ONLY): 30 min   Charges:   PT Evaluation $PT Re-evaluation: 1 Re-eval   PT General Charges $$ ACUTE PT VISIT: 1 Visit         Christyan Reger, PT, GCS 10/23/23,10:36 AM

## 2023-10-23 NOTE — Progress Notes (Signed)
 Nutrition Follow-up  DOCUMENTATION CODES:   Obesity unspecified  INTERVENTION:   -Resume TF via PEG:    Osmolite 1.5 @ 20 ml/hr  Once able, advance to goal rate of 60 ml/hr    60 ml Prosource TF20 daily   30 ml free water  flush every 4 hours     Tube feeding regimen provides 2240 kcal (100% of needs), 110 grams of protein, and 1097 ml of H2O. Total free water : 1277 ml daily   NUTRITION DIAGNOSIS:   Inadequate oral intake related to inability to eat (pt sedated and ventilated) as evidenced by NPO status.  Ongoing  GOAL:   Patient will meet greater than or equal to 90% of their needs  Progressing   MONITOR:   Diet advancement, Labs, Weight trends, TF tolerance, I & O's, Skin  REASON FOR ASSESSMENT:   Consult Assessment of nutrition requirement/status  ASSESSMENT:   83 y/o male with h/o GERD, esophageal dysphagia, HTN, BPH, mood disorder and hiatal hernia who is admitted with aspiration event, PNA, sepsis, cardiac arrest and dysphagia.  5/13- s/p EGD- found to have food in the entire esophagus and gastric polyps. PEG tube placed  5/14- extubated 5/15- TF re-started 5/20- s/p EGD- esophageal dilation with botox injection 5/22- advanced to clear liquid diet 5/24- pt pulled out PEG 5/25- PEG replaced by GI 5/26- KUB revealed some stool in the rectum and paucity of air in the colon, CT revealed G-tube not in the stomach, has spillage of contrast into the peritoneal cavity and moderate amount of pneumoperitoneum, TF d/c  5/27- s/p Exploratory laparotomy, takedown of gastrocutaneous fistula and repair of gastrotomy, placement of gastrostomy tube   Reviewed I/O's: +1 L x 24 hours and -3.3 L since 10/09/23  UOP: 800 ml x 24 hours   Pt sitting up in bed, with sitter at bedside. Pt interactive and pleasant and in good spirits today. Pt reports feeling well and is motivated to "get better so I can go home and plant my tomato plants".   Per general surgery notes, pt's PEG  to remain to suction and re-evaluate for starting feeds this afternoon.   Case discussed with RN and MD. Plan to start trickle feeds today. Per MD, remains NPO and may increase feeds tomorrow per surgery.   Wt has been stable over the past year.   Medications reviewed and include protonix  and zosyn .   Per TOC notes, plan for SNF placement. CIR appeal also being investigated.   Labs reviewed: CBGS: 109-174 (inpatient orders for glycemic control are 0-9 units insulin  aspart every 4 hours).    Diet Order:   Diet Order             Diet NPO time specified  Diet effective midnight                   EDUCATION NEEDS:   No education needs have been identified at this time  Skin:  Skin Assessment: Skin Integrity Issues: Skin Integrity Issues:: Other (Comment) Other: skin tear to lt lower arm  Last BM:  10/23/23  Height:   Ht Readings from Last 1 Encounters:  10/08/23 5' 9.02" (1.753 m)    Weight:   Wt Readings from Last 1 Encounters:  10/23/23 103.7 kg    Ideal Body Weight:  72.7 kg  BMI:  Body mass index is 33.75 kg/m.  Estimated Nutritional Needs:   Kcal:  2000-2200  Protein:  90-105 grams  Fluid:  1.8-2.1L/day    Herschel Lords,  RD, LDN, CDCES Registered Dietitian III Certified Diabetes Care and Education Specialist If unable to reach this RD, please use "RD Inpatient" group chat on secure chat between hours of 8am-4 pm daily

## 2023-10-23 NOTE — Progress Notes (Signed)
 Mobility Specialist - Progress Note   10/23/23 1603  Mobility  Activity Ambulated with assistance in hallway  Level of Assistance Minimal assist, patient does 75% or more (+ 2 for safety/chair follow/ lines)  Assistive Device Front wheel walker  Distance Ambulated (ft) 160 ft  Activity Response Tolerated well  Mobility visit 1 Mobility  Mobility Specialist Start Time (ACUTE ONLY) 1515  Mobility Specialist Stop Time (ACUTE ONLY) 1529  Mobility Specialist Time Calculation (min) (ACUTE ONLY) 14 min   Pt sitting in the recliner upon entry, utilizing RA. Pt agreeable to amb this date. Pt STS to RW Mod-MinA +2, requiring multimodal cuing for proper hand placement prior to standing. Pt amb one lap around the NS MinA of one with chair follow for safety. During amb Pt required min cuing for upright posture and to remain within the RW. Pt returned to the room, left seated in the recliner with alarm set and needs within reach. Sitter present at bedside.  Versa Gore Mobility Specialist 10/23/23 4:11 PM

## 2023-10-23 NOTE — Progress Notes (Signed)
 Occupational Therapy Re-evaluation  Patient Details Name: Robert Vega MRN: [id number] DOB: 1941/01/27 Today's Date: 10/23/2023   History of present illness 83 y.o male with significant PMH of OSA, GERD, Obesity, HTN, Dysphagia: EGD 03/2022 with food in upper esophagus complicated by aspiration event, cardiac arrest with round of CPR, and post resuscitation EGD with concern for lack of peristalsis. Pt presented to ED on 10/05/2023 with hypoxia, fever and generalized weakness; developed acute respiratory failure requiring intubation 5/10 due to aspiration pneumonia, extubated 5/15, but had significant agitation required brief course of Precedex ; pt now s/p exploratory laparotomy with closure of gastrotomy and insertion of gastrostomy tube on 10/22/23   OT comments  Chart reviewed to date, pt greeted in room, oriented to self, grossly to place and date (but reading the board), not oriented to situation. Pt is impulsive throughout requiring frequent-step by step multi modal cues for technique/safety. Re-evaluation completed s/p surgery yesterday. Pt requires MOD A for bed mobility, STS with MIN A with RW, amb with RW with CGA-MIN A, step by step multi modal cues for technique. MAX A required for LB dressing. Pt continues to perform ADL/functional mobility below PLOF, highly recommend intensive therapy to facilitate return to indep ADL status/PLOF. Pt is left in bedside chair with sitter in room, safety maintained, all needs met. OT will continue to follow.       If plan is discharge home, recommend the following:  Assistance with cooking/housework;Help with stairs or ramp for entrance;Supervision due to cognitive status;A lot of help with walking and/or transfers;A lot of help with bathing/dressing/bathroom   Equipment Recommendations  Other (comment) (defer to next venue of care)    Recommendations for Other Services      Precautions / Restrictions Precautions Precautions: Fall Recall of  Precautions/Restrictions: Impaired Restrictions Weight Bearing Restrictions Per Provider Order: No       Mobility Bed Mobility Overal bed mobility: Needs Assistance Bed Mobility: Rolling, Supine to Sit Rolling: Mod assist   Supine to sit: Mod assist, HOB elevated     General bed mobility comments: attempted log roll but ultimately pt performed supine>sit, step by step multi modal cues for technique    Transfers Overall transfer level: Needs assistance Equipment used: Rolling walker (2 wheels) Transfers: Sit to/from Stand Sit to Stand: Min assist                 Balance Overall balance assessment: Needs assistance Sitting-balance support: No upper extremity supported, Feet supported Sitting balance-Leahy Scale: Good     Standing balance support: Single extremity supported, During functional activity Standing balance-Leahy Scale: Fair                             ADL either performed or assessed with clinical judgement   ADL Overall ADL's : Needs assistance/impaired Eating/Feeding: NPO                   Lower Body Dressing: Maximal assistance;Bed level Lower Body Dressing Details (indicate cue type and reason): donn socks Toilet Transfer: Contact guard assist;Minimal assistance;+2 for safety/equipment;+2 for physical assistance;Rolling walker (2 wheels) Toilet Transfer Details (indicate cue type and reason): simulated Toileting- Clothing Manipulation and Hygiene: Maximal assistance;Sit to/from stand       Functional mobility during ADLs: Contact guard assist;Minimal assistance;Rolling walker (2 wheels);+2 for physical assistance;+2 for safety/equipment (frequent multi modal cues for technique)      Extremity/Trunk Assessment Upper Extremity Assessment Upper Extremity Assessment:  Generalized weakness   Lower Extremity Assessment Lower Extremity Assessment: Generalized weakness        Vision Baseline Vision/History: 1 Wears  glasses Patient Visual Report: No change from baseline Additional Comments: poor attention to environmental cues but carry over noted with multi modal cueing from therapist, will continue to assess   Perception     Praxis     Communication Communication Communication: Impaired   Cognition Arousal: Alert Behavior During Therapy: WFL for tasks assessed/performed, Impulsive Cognition: Cognition impaired   Orientation impairments: Situation Awareness: Intellectual awareness impaired, Online awareness impaired Memory impairment (select all impairments): Short-term memory Attention impairment (select first level of impairment): Sustained attention Executive functioning impairment (select all impairments): Sequencing, Problem solving, Reasoning                   Following commands: Intact Following commands impaired: Follows one step commands with increased time      Cueing   Cueing Techniques: Verbal cues, Tactile cues  Exercises Other Exercises Other Exercises: edu re: role of OT, role of rehab, discharge recommendations    Shoulder Instructions       General Comments vss throughout, abdominal binder in place pre/post session, sitter in room at end of session; R mitt donned    Pertinent Vitals/ Pain       Pain Assessment Pain Assessment: PAINAD Breathing: normal Negative Vocalization: none Facial Expression: smiling or inexpressive Body Language: tense, distressed pacing, fidgeting Consolability: distracted or reassured by voice/touch PAINAD Score: 2 Pain Intervention(s): Monitored during session, Repositioned  Home Living                                          Prior Functioning/Environment              Frequency  Min 3X/week        Progress Toward Goals  OT Goals(current goals can now be found in the care plan section)  Progress towards OT goals: Progressing toward goals  Acute Rehab OT Goals Patient Stated Goal: drive  again OT Goal Formulation: With patient Time For Goal Achievement: 11/06/23 Potential to Achieve Goals: Fair ADL Goals Pt Will Transfer to Toilet: with contact guard assist;ambulating  Plan      Co-evaluation    PT/OT/SLP Co-Evaluation/Treatment: Yes Reason for Co-Treatment: To address functional/ADL transfers;Necessary to address cognition/behavior during functional activity;For patient/therapist safety   OT goals addressed during session: ADL's and self-care      AM-PAC OT "6 Clicks" Daily Activity     Outcome Measure   Help from another person eating meals?: Total Help from another person taking care of personal grooming?: A Little Help from another person toileting, which includes using toliet, bedpan, or urinal?: A Lot Help from another person bathing (including washing, rinsing, drying)?: A Lot Help from another person to put on and taking off regular upper body clothing?: A Little Help from another person to put on and taking off regular lower body clothing?: A Lot 6 Click Score: 13    End of Session Equipment Utilized During Treatment: Gait belt;Rolling walker (2 wheels)  OT Visit Diagnosis: Unsteadiness on feet (R26.81);Repeated falls (R29.6);Muscle weakness (generalized) (M62.81)   Activity Tolerance Patient tolerated treatment well   Patient Left in chair;with call bell/phone within reach;with nursing/sitter in room   Nurse Communication Mobility status        Time: 0940-1000 OT Time Calculation (min):  20 min  Charges: OT General Charges $OT Visit: 1 Visit OT Evaluation $OT Re-eval: 1 Re-eval  Gerre Kraft, OTD OTR/L  10/23/23, 10:28 AM

## 2023-10-23 NOTE — Progress Notes (Signed)
 CC: PEG tube complication Subjective: Patient postop day 1 from exploratory laparotomy with closure of gastrotomy and insertion of gastrostomy tube.  He seems to be much more with it this morning.  He is talkative on exam.  He does report some soreness in his abdomen.  Slight increase in creatinine.  White count has improved significantly, drop in hemoglobin below concern for active bleeding.  Objective: Vital signs in last 24 hours: Temp:  [97 F (36.1 C)-99.1 F (37.3 C)] 98.7 F (37.1 C) (05/28 0329) Pulse Rate:  [80-90] 84 (05/28 0806) Resp:  [16-27] 17 (05/28 0329) BP: (123-147)/(61-85) 137/69 (05/28 0806) SpO2:  [92 %-100 %] 92 % (05/28 0806) Weight:  [103.7 kg] 103.7 kg (05/28 0500) Last BM Date : 10/22/23  Intake/Output from previous day: 05/27 0701 - 05/28 0700 In: 1859 [I.V.:1700; IV Piggyback:159] Out: 850 [Urine:800; Blood:50] Intake/Output this shift: No intake/output data recorded.  Physical exam:  Abdomen is soft, distended, midline wound with honeycomb on it.  There is minimal strikethrough on the dressing.  Left upper quadrant there is a G-tube in place.  This is draining to a gravity bag.  Lab Results: CBC  Recent Labs    10/22/23 0315 10/23/23 0402  WBC 19.7* 12.6*  HGB 11.0* 9.9*  HCT 32.1* 29.1*  PLT 286 273   BMET Recent Labs    10/22/23 0315 10/23/23 0402  NA 136 137  K 4.1 3.9  CL 104 103  CO2 22 24  GLUCOSE 112* 125*  BUN 24* 24*  CREATININE 1.52* 1.63*  CALCIUM 7.6* 7.9*   PT/INR Recent Labs    10/22/23 0100  LABPROT 15.3*  INR 1.2   ABG No results for input(s): "PHART", "HCO3" in the last 72 hours.  Invalid input(s): "PCO2", "PO2"  Studies/Results: DG Abd 1 View Result Date: 10/22/2023 CLINICAL DATA:  Nasogastric tube present. EXAM: ABDOMEN - 1 VIEW COMPARISON:  CT earlier today FINDINGS: Tip and side port of the enteric tube below the diaphragm in the stomach. Midline skin staples and possible upper abdominal drain.  IMPRESSION: Tip and side port of the enteric tube below the diaphragm in the stomach. Electronically Signed   By: Chadwick Colonel M.D.   On: 10/22/2023 15:23   CT ABDOMEN PELVIS WO CONTRAST Result Date: 10/22/2023 CLINICAL DATA:  Peritonitis or perforation suspected, gastrostomy tube complication EXAM: CT ABDOMEN AND PELVIS WITHOUT CONTRAST TECHNIQUE: Multidetector CT imaging of the abdomen and pelvis was performed following the standard protocol without IV contrast. RADIATION DOSE REDUCTION: This exam was performed according to the departmental dose-optimization program which includes automated exposure control, adjustment of the mA and/or kV according to patient size and/or use of iterative reconstruction technique. COMPARISON:  Same day abdominal radiographs and CT abdomen pelvis 10/21/2023 FINDINGS: Lower chest: Bibasilar scarring.  No acute abnormality. Hepatobiliary: No acute abnormality. Pancreas: Unremarkable. Spleen: Unremarkable. Adrenals/Urinary Tract: Normal adrenal glands. Contrast from prior CT within the renal collecting systems, ureters, and bladder. Irregular bladder wall thickening along the posterior bladder. Stomach/Bowel: Percutaneous gastrostomy tube is in a unchanged position within the anterior peritoneum. Extraluminal contrast is redemonstrated along the greater curvature of the stomach and in the anterior peritoneum. Wall thickening about the jejunum and transverse colon. Vascular/Lymphatic: Aortic atherosclerosis. No enlarged abdominal or pelvic lymph nodes. Reproductive: Unremarkable. Other: Similar intraperitoneal gas. No organized fluid collection or abscess. Musculoskeletal: No acute fracture. IMPRESSION: 1. The percutaneous gastrostomy tube is malpositioned within the anterior peritoneum with extraluminal contrast in the anterior peritoneal. Similar intraperitoneal gas. 2.  Infectious/inflammatory wall thickening about the jejunum and transverse colon. 3. Irregular bladder wall  thickening along the posterior bladder may be due underdistention or cystitis. Infiltrative malignancy considered less likely but not excluded. 4. Aortic Atherosclerosis (ICD10-I70.0). Electronically Signed   By: Rozell Cornet M.D.   On: 10/22/2023 01:57   DG ABDOMEN PEG TUBE LOCATION Result Date: 10/22/2023 CLINICAL DATA:  [id number] Gastrostomy complication (HCC) 141737 50 mL of gastrografin administered during exam. EXAM: ABDOMEN - 1 VIEW COMPARISON:  CT abdomen pelvis 10/21/2023, x-ray abdomen 10/21/2023 FINDINGS: Gastrostomy tube with tip noted along the mid abdomen. PO contrast administered via the tube with PO contrast partially opacifying the gastric lumen and the duodenal bulb on the right. Slightly unexpected PO contrast contour on the left. Previously extravasated PO contrast noted diluted within the upper abdomen. The bowel gas pattern is normal. IMPRESSION: Gastrostomy tube is likely in appropriate position with indeterminate slightly unexpected PO contrast contour on the left. Consider repeat CT abdomen without intravenous contrast for further evaluation of the stomach. These results were called by telephone at the time of interpretation on 10/22/2023 at 12:56 am to provider [facility] , who verbally acknowledged these results. Electronically Signed   By: Morgane  Naveau M.D.   On: 10/22/2023 00:59   CT ABDOMEN PELVIS WO CONTRAST Result Date: 10/21/2023 CLINICAL DATA:  Abdominal pain, acute, nonlocalized abdominal pain, peg replaced yesterday EXAM: CT ABDOMEN AND PELVIS WITHOUT CONTRAST TECHNIQUE: Multidetector CT imaging of the abdomen and pelvis was performed following the standard protocol without IV contrast. RADIATION DOSE REDUCTION: This exam was performed according to the departmental dose-optimization program which includes automated exposure control, adjustment of the mA and/or kV according to patient size and/or use of iterative reconstruction technique. COMPARISON:  10/21/2023  FINDINGS: Lower chest: No acute pleural or parenchymal lung disease. Moderate hiatal hernia. Hepatobiliary: Unremarkable unenhanced appearance of the liver and gallbladder. No biliary duct dilation. Pancreas: Unremarkable unenhanced appearance. Spleen: Unremarkable unenhanced appearance. Adrenals/Urinary Tract: Excreted contrast within the kidneys, ureters, and bladder. Please correlate with procedural history. No recent contrasted imaging study. Mild bilateral hydronephrosis and hydroureter. No urinary tract calculi. The adrenals are unremarkable. Stomach/Bowel: There is a percutaneous gastrostomy tube within the anterior peritoneal cavity, out side the bowel lumen. There is free spill of administered oral contrast throughout the peritoneal cavity within the upper abdomen. Moderate pneumoperitoneum consistent with intraperitoneal location of the gastrostomy tube. There is no bowel obstruction or ileus. Normal appendix right lower quadrant. Segmental wall thickening of the proximal jejunum could reflect inflammatory or infectious enteritis. Vascular/Lymphatic: Aortic atherosclerosis. No enlarged abdominal or pelvic lymph nodes. Reproductive: Prostate is unremarkable. Other: Free fluid seen within the lower abdomen and pelvis. Intraperitoneal oral contrast related to gastrostomy tube malpositioning within the peritoneal cavity. Pneumoperitoneum as above, also related to gastrostomy tube malpositioning. No abdominal wall hernia. Musculoskeletal: No acute or destructive bony abnormalities. Reconstructed images demonstrate no additional findings. IMPRESSION: 1. Malpositioning of the percutaneous gastrostomy tube within the peritoneal cavity. Moderate pneumoperitoneum, with free intraperitoneal spill of administered oral contrast throughout the upper abdomen. 2. Segmental mural thickening of the proximal jejunum, compatible with inflammatory or infectious enteritis. No bowel obstruction or ileus. 3. Hiatal hernia. 4.  Excreted contrast within the kidneys, ureters, and bladder. Please correlate with any recent contrasted procedure, as there is no recent contrasted imaging study. Mild bilateral hydronephrosis and hydroureter without clear etiology. 5.  Aortic Atherosclerosis (ICD10-I70.0). The result related to malpositioning of the percutaneous gastrostomy tube were called by telephone at the  time of interpretation on 10/21/2023 at 7:24 pm to the patient's nurse, Abita, who verbally acknowledged these results. Electronically Signed   By: Bobbye Burrow M.D.   On: 10/21/2023 19:26   DG Abd 2 Views Result Date: 10/21/2023 CLINICAL DATA:  Abdominal pain. EXAM: ABDOMEN - 2 VIEW COMPARISON:  10/09/2023 FINDINGS: The bowel gas pattern is normal. There is no evidence of free air. Peg tube seen in the mid abdomen. Bilateral pelvic phleboliths again noted. No radio-opaque calculi or other significant radiographic abnormality is seen. IMPRESSION: No acute findings.  Unremarkable bowel gas pattern. Electronically Signed   By: Marlyce Sine M.D.   On: 10/21/2023 14:30    Anti-infectives: Anti-infectives (From admission, onward)    Start     Dose/Rate Route Frequency Ordered Stop   10/21/23 2200  Ampicillin-Sulbactam (UNASYN) 3 g in sodium chloride  0.9 % 100 mL IVPB  Status:  Discontinued        3 g 200 mL/hr over 30 Minutes Intravenous Every 6 hours 10/21/23 2011 10/21/23 2014   10/21/23 2200  piperacillin -tazobactam (ZOSYN ) IVPB 3.375 g        3.375 g 12.5 mL/hr over 240 Minutes Intravenous Every 8 hours 10/21/23 2019     10/06/23 1600  cefTRIAXone  (ROCEPHIN ) 2 g in sodium chloride  0.9 % 100 mL IVPB        2 g 200 mL/hr over 30 Minutes Intravenous Every 24 hours 10/06/23 1415 10/09/23 1749   10/06/23 0600  piperacillin -tazobactam (ZOSYN ) IVPB 3.375 g  Status:  Discontinued        3.375 g 12.5 mL/hr over 240 Minutes Intravenous Every 8 hours 10/06/23 0356 10/06/23 1415   10/06/23 0500  cefTRIAXone  (ROCEPHIN ) 2 g in sodium  chloride 0.9 % 100 mL IVPB  Status:  Discontinued        2 g 200 mL/hr over 30 Minutes Intravenous Every 24 hours 10/05/23 0750 10/06/23 0341   10/06/23 0500  azithromycin  (ZITHROMAX ) 500 mg in sodium chloride  0.9 % 250 mL IVPB  Status:  Discontinued        500 mg 250 mL/hr over 60 Minutes Intravenous Every 24 hours 10/05/23 0750 10/06/23 1409   10/05/23 0615  metroNIDAZOLE  (FLAGYL ) IVPB 500 mg        500 mg 100 mL/hr over 60 Minutes Intravenous  Once 10/05/23 0611 10/05/23 0910   10/05/23 0545  cefTRIAXone  (ROCEPHIN ) 2 g in sodium chloride  0.9 % 100 mL IVPB        2 g 200 mL/hr over 30 Minutes Intravenous Once 10/05/23 0533 10/05/23 0650   10/05/23 0545  azithromycin  (ZITHROMAX ) 500 mg in sodium chloride  0.9 % 250 mL IVPB        500 mg 250 mL/hr over 60 Minutes Intravenous  Once 10/05/23 0533 10/05/23 0805       Assessment/Plan:  Patient postop day 1 from a exploratory laparotomy with G-tube placement and closure of gastrotomy after his PEG tube was pulled out soon after placement.  He seems to be doing much better this morning.  He actually interacts on exam.  Unfortunately G-tube has not been draining to gravity yesterday.  We will have it drained to gravity today until noon.  If not a lot comes out, less than 300 cc, then can start meds through G-tube.  We can also start tube feeds.  However, I would like to go extremely slow with him and start trophic tube feeds today and not increase it until at least tomorrow.  Half please get  CBC tomorrow to assess anemia and white count.  Needs continued resuscitation for his AKI.  Recommend a total of 4 days of antibiotics for intra-abdominal infection.  Severa Daniels, M.D. [facility]

## 2023-10-23 NOTE — NC FL2 (Signed)
 [place]  MEDICAID FL2 LEVEL OF CARE FORM     IDENTIFICATION  Patient Name: Robert Vega Birthdate: 1940/08/21 Sex: male Admission Date (Current Location): 10/05/2023  [place] [facility] and [place] Number:  Chiropodist and Address:  [facility], [address], [place], [place] [address]      Provider Number: [id number]  Attending Physician Name and Address:  Tiajuana Fluke, MD  Relative Name and Phone Number:       Current Level of Care: Hospital Recommended Level of Care: Skilled Nursing Facility Prior Approval Number:    Date Approved/Denied:   PASRR Number: [id number]  Discharge Plan: SNF    Current Diagnoses: Patient Active Problem List   Diagnosis Date Noted   Generalized abdominal pain 10/21/2023   Gastrostomy complication (HCC) 10/20/2023   AKI (acute kidney injury) (HCC) 10/20/2023   Generalized weakness 10/19/2023   Severe sepsis (HCC) 10/16/2023   Acute delirium 10/16/2023   Obesity (BMI 30-39.9) 10/16/2023   Aspiration pneumonia (HCC) 10/11/2023   Achalasia of esophagus 10/11/2023   History of dysphagia 10/09/2023   Multifocal pneumonia 10/05/2023   Acute respiratory failure with hypoxia and hypercapnia (HCC) 10/05/2023   Leg weakness 08/21/2023   Cardiac arrest (HCC) 04/02/2022   Essential hypertension, benign 09/13/2021   Dysphagia 08/24/2020   Phimosis 10/23/2016   Mood disorder (HCC) 04/10/2016   History of melanoma in situ 10/18/2014   Advance directive discussed with patient 04/05/2014   Sleep disturbance 03/18/2012   GERD (gastroesophageal reflux disease)    BPH (benign prostatic hyperplasia)    Routine general medical examination at a health care facility 03/06/2011   Spinal stenosis of lumbar region 04/20/2008   Osteoarthritis 04/20/2008   Allergic rhinitis due to pollen 04/17/2007    Orientation RESPIRATION BLADDER Height & Weight     Self  Normal Incontinent, Indwelling catheter Weight: 228 lb  9.9 oz (103.7 kg) Height:  5' 9.02" (175.3 cm)  BEHAVIORAL SYMPTOMS/MOOD NEUROLOGICAL BOWEL NUTRITION STATUS  Other (Comment) (Restless)  (None) Incontinent Feeding tube  AMBULATORY STATUS COMMUNICATION OF NEEDS Skin   Limited Assist Verbally Skin abrasions, Bruising, Other (Comment), Surgical wounds (Erythema/redness. Incisions on abdomen (honeycomb) and left abdomen (gauze, drain sponge, 4x4 gauze, tegaderm). Skin tear on left arm (Foam prn).)                       Personal Care Assistance Level of Assistance  Bathing, Feeding, Dressing Bathing Assistance: Maximum assistance Feeding assistance: Limited assistance Dressing Assistance: Maximum assistance     Functional Limitations Info  Sight, Hearing, Speech Sight Info: Adequate Hearing Info: Adequate Speech Info: Adequate    SPECIAL CARE FACTORS FREQUENCY  PT (By licensed PT), OT (By licensed OT)     PT Frequency: 5 x week OT Frequency: 5 x week            Contractures Contractures Info: Not present    Additional Factors Info  Code Status, Allergies Code Status Info: Full code Allergies Info: Sulfonamide Derivatives           Current Medications (10/23/2023):  This is the current hospital active medication list Current Facility-Administered Medications  Medication Dose Route Frequency Provider Last Rate Last Admin   acetaminophen  (TYLENOL ) suppository 650 mg  650 mg Rectal Q4H PRN Duncan, Hazel V, MD   650 mg at 10/22/23 4403   acetaminophen  (TYLENOL ) tablet 650 mg  650 mg Per Tube Q6H PRN Rust-Chester, Gara July, NP   650 mg at  10/13/23 2215   bisacodyl  (DULCOLAX) suppository 10 mg  10 mg Rectal Daily PRN Donaciano Frizzle, MD   10 mg at 10/12/23 0919   Chlorhexidine  Gluconate Cloth 2 % PADS 6 each  6 each Topical Q0600 Barrett Lick, MD   6 each at 10/23/23 1610   gentamicin  ointment (GARAMYCIN ) 0.1 %   Topical TID Verla Glaze, MD   Given at 10/22/23 2107   haloperidol  lactate (HALDOL ) injection 1 mg  1  mg Intravenous Q4H PRN Verla Glaze, MD   1 mg at 10/21/23 1529   hydrALAZINE  (APRESOLINE ) injection 10 mg  10 mg Intravenous Q6H PRN Marnee Sink, MD   10 mg at 10/11/23 9604   insulin  aspart (novoLOG ) injection 0-9 Units  0-9 Units Subcutaneous Q4H Marnee Sink, MD   1 Units at 10/23/23 5409   ipratropium-albuterol  (DUONEB) 0.5-2.5 (3) MG/3ML nebulizer solution 3 mL  3 mL Nebulization Q4H PRN Donaciano Frizzle, MD       OLANZapine  (ZYPREXA ) injection 10 mg  10 mg Intramuscular QHS PRN Verla Glaze, MD       ondansetron  (ZOFRAN ) injection 4 mg  4 mg Intravenous Q6H PRN Marnee Sink, MD   4 mg at 10/05/23 1713   Oral care mouth rinse  15 mL Mouth Rinse PRN Kasa, Kurian, MD   15 mL at 10/12/23 1600   oxyCODONE -acetaminophen  (PERCOCET/ROXICET) 5-325 MG per tablet 1 tablet  1 tablet Per Tube Q6H PRN Donaciano Frizzle, MD       pantoprazole  (PROTONIX ) injection 40 mg  40 mg Intravenous Q24H Marnee Sink, MD   40 mg at 10/22/23 1142   piperacillin -tazobactam (ZOSYN ) IVPB 3.375 g  3.375 g Intravenous Q8H Madelynn Schilder, RPH 12.5 mL/hr at 10/23/23 0552 3.375 g at 10/23/23 8119     Discharge Medications: Please see discharge summary for a list of discharge medications.  Relevant Imaging Results:  Relevant Lab Results:   Additional Information SS#: [id number]  Odilia Bennett, LCSW

## 2023-10-23 NOTE — Progress Notes (Signed)
 PROGRESS NOTE    Robert BLUITT  ZOX:[id number] DOB: [birth year] DOA: 10/05/2023 PCP: Helaine Llanos, MD    Brief Narrative:  83 y.o male with significant PMH of OSA, GERD, Obesity, HTN, Dysphagia: EGD 03/2022 with food in upper esophagus complicated by aspiration event, cardiac arrest with round of CPR, and post resuscitation EGD with concern for lack of peristalsis. Prior EGD 08/2020 with note of abnormal cricopharyngeus, decrease in motility in esophagus, and spastic LES who presented to the ED from with hypoxia, fever and generalized weakness.  Patient developed acute respiratory failure requiring intubation due to aspiration pneumonia, he was treated with broad-spectrum antibiotics and managed in ICU. Condition had improved, extubated 5/15, but has significant agitation required brief course of Precedex . 5/10: Admit to [facility] service with sepsis due to Aspiration Pneumonia.  Course complicated by Acute Respiratory Failure due to Aspiration of vomitus requiring s/p cardiac arrest  transfer to ICU and intubation.  5/11 flexible bronchoscopy done by critical care team 5/12 remains intubated 5/13 remains on vent, s/p PEG tube, +vomiting last night, failed SAT/SBT 5/14 extubated successfully but with severe delirium 5/20.  Botulinum toxin injection into the lower esophageal sphincter by Dr. Ole Berkeley   5/21 esophagram showing diffusely distended esophagus with distal obstruction and severe dysmotility suggestive of achalasia. 5/22.  Advised I would not give him any food and will continue PEG feeding he can try liquids to see if that goes down but still high risk for aspiration.  Patient had a fall on the evening of 5/22. 5/23.  Patient feels okay.  Awaken from sleep.  Did not offer any complaints.  Awaiting insurance authorization for rehab. 5/24.  Patient feeling okay.  States he is doing okay with the liquid diet.  Still feels weak. 5/25.  Patient pulled out PEG tube this morning.  Foley catheter  placed in the stoma.  GI able to place a another PEG tube and remove the Foley. 5/26.  Creatinine up at 1.83.  Continue IV fluid hydration.  Patient started having pain after tube feeding started.  CT scan showed that the PEG tube was not in the correct place.  Notified gastroenterology.  Started empiric antibiotics. 5/27.  Patient was taken urgently to the operating room by Dr. Cornel Diesel because the patient had fever and elevated white count with suspected intra-abdominal sepsis.  Assessment & Plan:   Principal Problem:   Severe sepsis (HCC) Active Problems:   Gastrostomy complication (HCC)   Dysphagia   Achalasia of esophagus   AKI (acute kidney injury) (HCC)   Acute delirium   Acute respiratory failure with hypoxia and hypercapnia (HCC)   Essential hypertension, benign   GERD (gastroesophageal reflux disease)   Multifocal pneumonia   History of dysphagia   Aspiration pneumonia (HCC)   Obesity (BMI 30-39.9)   Generalized weakness   Generalized abdominal pain   Severe sepsis (HCC) Recurrence with different problem.  Intra-abdominal infection secondary to PEG tube not being in the right place.  Elevated white count and fever and acute kidney injury.  Patient brought emergently to the operating room on 5/27 by Dr. Cornel Diesel.   Plan: N.p.o. IV Zosyn  Monitor vitals and fever curve  Gastrostomy complication (HCC) Patient self discontinued PEG tube and malpositioning likely resulted in some intra-abdominal sepsis.  Tube feeds held.  Now restarted at trickle rate   AKI (acute kidney injury) (HCC) Off IV fluids for now.  Free water  flushes via NGT   Achalasia of esophagus Status post botulinum toxin injection on  5/20.   Esophagram showing dilated esophagus distal obstruction.   Dysphagia Likely secondary to achalasia.  Patient had Botox injection of the lower esophageal sphincter due to lower esophageal sphincter hypertension causing the patient not being able to swallow and empty the  esophagus of his ingested food.  Food was in the esophagus on the previous EGD.  Currently n.p.o. as directed by general surgery     Acute delirium Continue as needed Haldol .  Discontinue Reglan  Can start Seroquel  tonight   Acute respiratory failure with hypoxia and hypercapnia (HCC) Pneumonia Patient now on room air   Essential hypertension, benign Hold Norvasc    GERD (gastroesophageal reflux disease) PPI     Generalized weakness Therapy evaluations   Obesity (BMI 30-39.9) BMI 33.16    DVT prophylaxis: SCD Code Status: Full Family Communication: None Disposition Plan: Status is: Inpatient Remains inpatient appropriate because: Multiple acute issues as above   Level of care: Progressive  Consultants:  General surgery  Procedures:  Ex lap  Antimicrobials: Zosyn    Subjective: Seen and examined.  Resting in bed.  No visible distress  Objective: Vitals:   10/23/23 0500 10/23/23 0806 10/23/23 1019 10/23/23 1156  BP:  137/69  (!) 112/90  Pulse:  84  92  Resp:      Temp:      TempSrc:      SpO2:  92% 92% 93%  Weight: 103.7 kg     Height:        Intake/Output Summary (Last 24 hours) at 10/23/2023 1411 Last data filed at 10/23/2023 0300 Gross per 24 hour  Intake 158.98 ml  Output 800 ml  Net -641.02 ml   Filed Weights   10/18/23 0701 10/19/23 0500 10/23/23 0500  Weight: 99.2 kg 101.9 kg 103.7 kg    Examination:  General exam: Appears calm and comfortable  Respiratory system: Clear to auscultation. Respiratory effort normal. Cardiovascular system: S1-S2, RRR, no murmurs, no pedal edema Gastrointestinal system: Soft, NT ND, positive PEG Central nervous system: Alert and oriented. No focal neurological deficits. Extremities: Symmetric 5 x 5 power. Skin: No rashes, lesions or ulcers Psychiatry: Judgement and insight appear normal. Mood & affect appropriate.     Data Reviewed: I have personally reviewed following labs and imaging  studies  CBC: Recent Labs  Lab 10/20/23 0511 10/21/23 0714 10/22/23 0100 10/22/23 0315 10/23/23 0402  WBC 8.4 7.4 18.7* 19.7* 12.6*  NEUTROABS  --  5.1 16.4* 17.2*  --   HGB 10.9* 11.1* 11.3* 11.0* 9.9*  HCT 33.0* 33.6* 32.9* 32.1* 29.1*  MCV 96.2 96.3 94.5 94.1 95.1  PLT 270 300 300 286 273   Basic Metabolic Panel: Recent Labs  Lab 10/20/23 0511 10/21/23 0714 10/22/23 0100 10/22/23 0315 10/23/23 0402  NA 139 141 135 136 137  K 4.5 4.3 4.3 4.1 3.9  CL 101 106 104 104 103  CO2 30 27 23 22 24   GLUCOSE 112* 119* 118* 112* 125*  BUN 37* 30* 26* 24* 24*  CREATININE 1.72* 1.83* 1.55* 1.52* 1.63*  CALCIUM 8.2* 8.1* 7.9* 7.6* 7.9*   GFR: Estimated Creatinine Clearance: 40 mL/min (A) (by C-G formula based on SCr of 1.63 mg/dL (H)). Liver Function Tests: Recent Labs  Lab 10/22/23 0100 10/23/23 0402  AST 21 17  ALT 21 17  ALKPHOS 78 62  BILITOT 0.6 0.8  PROT 6.6 6.3*  ALBUMIN 2.8* 2.4*   No results for input(s): "LIPASE", "AMYLASE" in the last 168 hours. No results for input(s): "AMMONIA" in the last  168 hours. Coagulation Profile: Recent Labs  Lab 10/22/23 0100  INR 1.2   Cardiac Enzymes: No results for input(s): "CKTOTAL", "CKMB", "CKMBINDEX", "TROPONINI" in the last 168 hours. BNP (last 3 results) No results for input(s): "PROBNP" in the last 8760 hours. HbA1C: No results for input(s): "HGBA1C" in the last 72 hours. CBG: Recent Labs  Lab 10/22/23 1934 10/22/23 2337 10/23/23 0327 10/23/23 0803 10/23/23 1159  GLUCAP 163* 140* 124* 109* 109*   Lipid Profile: No results for input(s): "CHOL", "HDL", "LDLCALC", "TRIG", "CHOLHDL", "LDLDIRECT" in the last 72 hours. Thyroid  Function Tests: No results for input(s): "TSH", "T4TOTAL", "FREET4", "T3FREE", "THYROIDAB" in the last 72 hours. Anemia Panel: No results for input(s): "VITAMINB12", "FOLATE", "FERRITIN", "TIBC", "IRON", "RETICCTPCT" in the last 72 hours. Sepsis Labs: Recent Labs  Lab 10/22/23 0100  10/22/23 0315  LATICACIDVEN 1.3 1.1    Recent Results (from the past 240 hours)  Culture, blood (x 2)     Status: None (Preliminary result)   Collection Time: 10/22/23  1:00 AM   Specimen: BLOOD  Result Value Ref Range Status   Specimen Description BLOOD BLOOD RIGHT ARM  Final   Special Requests   Final    BOTTLES DRAWN AEROBIC AND ANAEROBIC Blood Culture adequate volume   Culture   Final    NO GROWTH 1 DAY Performed at [facility], [address] [place], [place] [address]    Report Status PENDING  Incomplete  Culture, blood (x 2)     Status: None (Preliminary result)   Collection Time: 10/22/23  1:00 AM   Specimen: BLOOD  Result Value Ref Range Status   Specimen Description BLOOD BLOOD RIGHT ARM  Final   Special Requests   Final    BOTTLES DRAWN AEROBIC AND ANAEROBIC Blood Culture adequate volume   Culture   Final    NO GROWTH 1 DAY Performed at [facility], [address] [place], [place] [address]    Report Status PENDING  Incomplete         Radiology Studies: DG Abd 1 View Result Date: 10/22/2023 CLINICAL DATA:  Nasogastric tube present. EXAM: ABDOMEN - 1 VIEW COMPARISON:  CT earlier today FINDINGS: Tip and side port of the enteric tube below the diaphragm in the stomach. Midline skin staples and possible upper abdominal drain. IMPRESSION: Tip and side port of the enteric tube below the diaphragm in the stomach. Electronically Signed   By: Chadwick Colonel M.D.   On: 10/22/2023 15:23   CT ABDOMEN PELVIS WO CONTRAST Result Date: 10/22/2023 CLINICAL DATA:  Peritonitis or perforation suspected, gastrostomy tube complication EXAM: CT ABDOMEN AND PELVIS WITHOUT CONTRAST TECHNIQUE: Multidetector CT imaging of the abdomen and pelvis was performed following the standard protocol without IV contrast. RADIATION DOSE REDUCTION: This exam was performed according to the departmental dose-optimization program which includes automated exposure control,  adjustment of the mA and/or kV according to patient size and/or use of iterative reconstruction technique. COMPARISON:  Same day abdominal radiographs and CT abdomen pelvis 10/21/2023 FINDINGS: Lower chest: Bibasilar scarring.  No acute abnormality. Hepatobiliary: No acute abnormality. Pancreas: Unremarkable. Spleen: Unremarkable. Adrenals/Urinary Tract: Normal adrenal glands. Contrast from prior CT within the renal collecting systems, ureters, and bladder. Irregular bladder wall thickening along the posterior bladder. Stomach/Bowel: Percutaneous gastrostomy tube is in a unchanged position within the anterior peritoneum. Extraluminal contrast is redemonstrated along the greater curvature of the stomach and in the anterior peritoneum. Wall thickening about the jejunum and transverse colon. Vascular/Lymphatic: Aortic atherosclerosis. No  enlarged abdominal or pelvic lymph nodes. Reproductive: Unremarkable. Other: Similar intraperitoneal gas. No organized fluid collection or abscess. Musculoskeletal: No acute fracture. IMPRESSION: 1. The percutaneous gastrostomy tube is malpositioned within the anterior peritoneum with extraluminal contrast in the anterior peritoneal. Similar intraperitoneal gas. 2. Infectious/inflammatory wall thickening about the jejunum and transverse colon. 3. Irregular bladder wall thickening along the posterior bladder may be due underdistention or cystitis. Infiltrative malignancy considered less likely but not excluded. 4. Aortic Atherosclerosis (ICD10-I70.0). Electronically Signed   By: Rozell Cornet M.D.   On: 10/22/2023 01:57   DG ABDOMEN PEG TUBE LOCATION Result Date: 10/22/2023 CLINICAL DATA:  [id number] Gastrostomy complication (HCC) 141737 50 mL of gastrografin administered during exam. EXAM: ABDOMEN - 1 VIEW COMPARISON:  CT abdomen pelvis 10/21/2023, x-ray abdomen 10/21/2023 FINDINGS: Gastrostomy tube with tip noted along the mid abdomen. PO contrast administered via the tube with PO  contrast partially opacifying the gastric lumen and the duodenal bulb on the right. Slightly unexpected PO contrast contour on the left. Previously extravasated PO contrast noted diluted within the upper abdomen. The bowel gas pattern is normal. IMPRESSION: Gastrostomy tube is likely in appropriate position with indeterminate slightly unexpected PO contrast contour on the left. Consider repeat CT abdomen without intravenous contrast for further evaluation of the stomach. These results were called by telephone at the time of interpretation on 10/22/2023 at 12:56 am to provider [facility] , who verbally acknowledged these results. Electronically Signed   By: Morgane  Naveau M.D.   On: 10/22/2023 00:59   CT ABDOMEN PELVIS WO CONTRAST Result Date: 10/21/2023 CLINICAL DATA:  Abdominal pain, acute, nonlocalized abdominal pain, peg replaced yesterday EXAM: CT ABDOMEN AND PELVIS WITHOUT CONTRAST TECHNIQUE: Multidetector CT imaging of the abdomen and pelvis was performed following the standard protocol without IV contrast. RADIATION DOSE REDUCTION: This exam was performed according to the departmental dose-optimization program which includes automated exposure control, adjustment of the mA and/or kV according to patient size and/or use of iterative reconstruction technique. COMPARISON:  10/21/2023 FINDINGS: Lower chest: No acute pleural or parenchymal lung disease. Moderate hiatal hernia. Hepatobiliary: Unremarkable unenhanced appearance of the liver and gallbladder. No biliary duct dilation. Pancreas: Unremarkable unenhanced appearance. Spleen: Unremarkable unenhanced appearance. Adrenals/Urinary Tract: Excreted contrast within the kidneys, ureters, and bladder. Please correlate with procedural history. No recent contrasted imaging study. Mild bilateral hydronephrosis and hydroureter. No urinary tract calculi. The adrenals are unremarkable. Stomach/Bowel: There is a percutaneous gastrostomy tube within the anterior  peritoneal cavity, out side the bowel lumen. There is free spill of administered oral contrast throughout the peritoneal cavity within the upper abdomen. Moderate pneumoperitoneum consistent with intraperitoneal location of the gastrostomy tube. There is no bowel obstruction or ileus. Normal appendix right lower quadrant. Segmental wall thickening of the proximal jejunum could reflect inflammatory or infectious enteritis. Vascular/Lymphatic: Aortic atherosclerosis. No enlarged abdominal or pelvic lymph nodes. Reproductive: Prostate is unremarkable. Other: Free fluid seen within the lower abdomen and pelvis. Intraperitoneal oral contrast related to gastrostomy tube malpositioning within the peritoneal cavity. Pneumoperitoneum as above, also related to gastrostomy tube malpositioning. No abdominal wall hernia. Musculoskeletal: No acute or destructive bony abnormalities. Reconstructed images demonstrate no additional findings. IMPRESSION: 1. Malpositioning of the percutaneous gastrostomy tube within the peritoneal cavity. Moderate pneumoperitoneum, with free intraperitoneal spill of administered oral contrast throughout the upper abdomen. 2. Segmental mural thickening of the proximal jejunum, compatible with inflammatory or infectious enteritis. No bowel obstruction or ileus. 3. Hiatal hernia. 4. Excreted contrast within the kidneys, ureters, and bladder.  Please correlate with any recent contrasted procedure, as there is no recent contrasted imaging study. Mild bilateral hydronephrosis and hydroureter without clear etiology. 5.  Aortic Atherosclerosis (ICD10-I70.0). The result related to malpositioning of the percutaneous gastrostomy tube were called by telephone at the time of interpretation on 10/21/2023 at 7:24 pm to the patient's nurse, Abita, who verbally acknowledged these results. Electronically Signed   By: Bobbye Burrow M.D.   On: 10/21/2023 19:26        Scheduled Meds:  Chlorhexidine  Gluconate Cloth   6 each Topical Q0600   free water   30 mL Per Tube Q4H   gentamicin ointment   Topical TID   insulin  aspart  0-9 Units Subcutaneous Q4H   pantoprazole  (PROTONIX ) IV  40 mg Intravenous Q24H   Continuous Infusions:  feeding supplement (OSMOLITE 1.5 CAL)     piperacillin -tazobactam (ZOSYN )  IV 3.375 g (10/23/23 0552)     LOS: 18 days      Tiajuana Fluke, MD [facility]   If 7PM-7AM, please contact night-coverage  10/23/2023, 2:11 PM

## 2023-10-23 NOTE — Plan of Care (Signed)
  Problem: Education: Goal: Knowledge of General Education information will improve Description: Including pain rating scale, medication(s)/side effects and non-pharmacologic comfort measures Outcome: Progressing   Problem: Health Behavior/Discharge Planning: Goal: Ability to manage health-related needs will improve Outcome: Progressing   Problem: Clinical Measurements: Goal: Ability to maintain clinical measurements within normal limits will improve Outcome: Progressing Goal: Will remain free from infection Outcome: Progressing Goal: Diagnostic test results will improve Outcome: Progressing Goal: Respiratory complications will improve Outcome: Progressing Goal: Cardiovascular complication will be avoided Outcome: Progressing   Problem: Activity: Goal: Risk for activity intolerance will decrease Outcome: Progressing   Problem: Nutrition: Goal: Adequate nutrition will be maintained Outcome: Progressing   Problem: Coping: Goal: Level of anxiety will decrease Outcome: Progressing   Problem: Elimination: Goal: Will not experience complications related to bowel motility Outcome: Progressing Goal: Will not experience complications related to urinary retention Outcome: Progressing   Problem: Pain Managment: Goal: General experience of comfort will improve and/or be controlled Outcome: Progressing   Problem: Safety: Goal: Ability to remain free from injury will improve Outcome: Progressing   Problem: Skin Integrity: Goal: Risk for impaired skin integrity will decrease Outcome: Progressing   Problem: Activity: Goal: Ability to tolerate increased activity will improve Outcome: Progressing   Problem: Clinical Measurements: Goal: Ability to maintain a body temperature in the normal range will improve Outcome: Progressing   Problem: Respiratory: Goal: Ability to maintain adequate ventilation will improve Outcome: Progressing Goal: Ability to maintain a clear airway  will improve Outcome: Progressing   Problem: Education: Goal: Ability to describe self-care measures that may prevent or decrease complications (Diabetes Survival Skills Education) will improve Outcome: Progressing Goal: Individualized Educational Video(s) Outcome: Progressing   Problem: Coping: Goal: Ability to adjust to condition or change in health will improve Outcome: Progressing   Problem: Fluid Volume: Goal: Ability to maintain a balanced intake and output will improve Outcome: Progressing   Problem: Health Behavior/Discharge Planning: Goal: Ability to identify and utilize available resources and services will improve Outcome: Progressing Goal: Ability to manage health-related needs will improve Outcome: Progressing   Problem: Metabolic: Goal: Ability to maintain appropriate glucose levels will improve Outcome: Progressing   Problem: Nutritional: Goal: Maintenance of adequate nutrition will improve Outcome: Progressing Goal: Progress toward achieving an optimal weight will improve Outcome: Progressing   Problem: Skin Integrity: Goal: Risk for impaired skin integrity will decrease Outcome: Progressing   Problem: Tissue Perfusion: Goal: Adequacy of tissue perfusion will improve Outcome: Progressing   Problem: Activity: Goal: Ability to tolerate increased activity will improve Outcome: Progressing   Problem: Respiratory: Goal: Ability to maintain a clear airway and adequate ventilation will improve Outcome: Progressing   Problem: Role Relationship: Goal: Method of communication will improve Outcome: Progressing   Problem: Fluid Volume: Goal: Hemodynamic stability will improve Outcome: Progressing   Problem: Clinical Measurements: Goal: Diagnostic test results will improve Outcome: Progressing Goal: Signs and symptoms of infection will decrease Outcome: Progressing   Problem: Respiratory: Goal: Ability to maintain adequate ventilation will  improve Outcome: Progressing

## 2023-10-24 DIAGNOSIS — R6520 Severe sepsis without septic shock: Secondary | ICD-10-CM | POA: Diagnosis not present

## 2023-10-24 DIAGNOSIS — A419 Sepsis, unspecified organism: Secondary | ICD-10-CM | POA: Diagnosis not present

## 2023-10-24 LAB — CBC
HCT: 25.6 % — ABNORMAL LOW (ref 39.0–52.0)
Hemoglobin: 8.8 g/dL — ABNORMAL LOW (ref 13.0–17.0)
MCH: 32.4 pg (ref 26.0–34.0)
MCHC: 34.4 g/dL (ref 30.0–36.0)
MCV: 94.1 fL (ref 80.0–100.0)
Platelets: 267 10*3/uL (ref 150–400)
RBC: 2.72 MIL/uL — ABNORMAL LOW (ref 4.22–5.81)
RDW: 12.9 % (ref 11.5–15.5)
WBC: 8.1 10*3/uL (ref 4.0–10.5)
nRBC: 0 % (ref 0.0–0.2)

## 2023-10-24 LAB — BASIC METABOLIC PANEL WITH GFR
Anion gap: 7 (ref 5–15)
BUN: 23 mg/dL (ref 8–23)
CO2: 25 mmol/L (ref 22–32)
Calcium: 7.7 mg/dL — ABNORMAL LOW (ref 8.9–10.3)
Chloride: 109 mmol/L (ref 98–111)
Creatinine, Ser: 1.4 mg/dL — ABNORMAL HIGH (ref 0.61–1.24)
GFR, Estimated: 50 mL/min — ABNORMAL LOW (ref >60)
Glucose, Bld: 125 mg/dL — ABNORMAL HIGH (ref 70–99)
Potassium: 3.3 mmol/L — ABNORMAL LOW (ref 3.5–5.1)
Sodium: 141 mmol/L (ref 135–145)

## 2023-10-24 LAB — GLUCOSE, CAPILLARY
Glucose-Capillary: 125 mg/dL — ABNORMAL HIGH (ref 70–99)
Glucose-Capillary: 107 mg/dL — ABNORMAL HIGH (ref 70–99)
Glucose-Capillary: 88 mg/dL (ref 70–99)
Glucose-Capillary: 106 mg/dL — ABNORMAL HIGH (ref 70–99)

## 2023-10-24 MED ORDER — POTASSIUM CHLORIDE 20 MEQ/15ML (10%) PO SOLN
40.0000 meq | Freq: Once | ORAL | Status: AC
  Administered 2023-10-24: 40 meq
  Filled 2023-10-24: qty 30

## 2023-10-24 MED ORDER — POTASSIUM CHLORIDE 20 MEQ PO PACK: 40.0000 meq | PACK | Freq: Once | ORAL | Status: DC

## 2023-10-24 MED ORDER — OSMOLITE 1.5 CAL PO LIQD
1000.0000 mL | ORAL | Status: AC
  Administered 2023-10-24 – 2023-11-04 (×12): 1000 mL

## 2023-10-24 MED ORDER — QUETIAPINE FUMARATE 25 MG PO TABS
50.0000 mg | ORAL_TABLET | Freq: Every day | ORAL | Status: DC
  Administered 2023-10-24 – 2023-10-25 (×2): 50 mg
  Filled 2023-10-24 (×2): qty 2

## 2023-10-24 MED ORDER — QUETIAPINE FUMARATE 25 MG PO TABS: 25.0000 mg | ORAL_TABLET | Freq: Every day | ORAL | Status: DC

## 2023-10-24 MED ORDER — PROSOURCE TF20 ENFIT COMPATIBL EN LIQD
60.0000 mL | Freq: Every day | ENTERAL | Status: DC
  Administered 2023-10-25 – 2023-10-30 (×6): 60 mL
  Filled 2023-10-24 (×6): qty 60

## 2023-10-24 NOTE — Progress Notes (Signed)
 This patient underwent surgery with a feeding tube placed and cleaning out of the peritoneum after the patient's PEG tube got dislodged with replacement in the peritoneal cavity.   The patient had an EGD with Botox injection for achalasia with a follow-up esophagram showing continued severe dysmotility with diffusely dilated esophagus.  This is consistent with end-stage esophageal disease.  The patient should only be fed using the PEG tube due to the risk of aspiration.The patient is now being followed by surgery and there is nothing further to do from a GI point of view at this time.  I will sign off.  Please call if any further GI concerns or questions.  We would like to thank you for the opportunity to participate in the care of Robert Vega.

## 2023-10-24 NOTE — Plan of Care (Signed)
  Problem: Education: Goal: Knowledge of General Education information will improve Description: Including pain rating scale, medication(s)/side effects and non-pharmacologic comfort measures Outcome: Progressing   Problem: Health Behavior/Discharge Planning: Goal: Ability to manage health-related needs will improve Outcome: Progressing   Problem: Clinical Measurements: Goal: Ability to maintain clinical measurements within normal limits will improve Outcome: Progressing Goal: Will remain free from infection Outcome: Progressing Goal: Diagnostic test results will improve Outcome: Progressing Goal: Respiratory complications will improve Outcome: Progressing Goal: Cardiovascular complication will be avoided Outcome: Progressing   Problem: Activity: Goal: Risk for activity intolerance will decrease Outcome: Progressing   Problem: Nutrition: Goal: Adequate nutrition will be maintained Outcome: Progressing   Problem: Coping: Goal: Level of anxiety will decrease Outcome: Progressing   Problem: Elimination: Goal: Will not experience complications related to bowel motility Outcome: Progressing Goal: Will not experience complications related to urinary retention Outcome: Progressing   Problem: Pain Managment: Goal: General experience of comfort will improve and/or be controlled Outcome: Progressing   Problem: Safety: Goal: Ability to remain free from injury will improve Outcome: Progressing   Problem: Skin Integrity: Goal: Risk for impaired skin integrity will decrease Outcome: Progressing   Problem: Activity: Goal: Ability to tolerate increased activity will improve Outcome: Progressing   Problem: Clinical Measurements: Goal: Ability to maintain a body temperature in the normal range will improve Outcome: Progressing   Problem: Respiratory: Goal: Ability to maintain adequate ventilation will improve Outcome: Progressing Goal: Ability to maintain a clear airway  will improve Outcome: Progressing   Problem: Education: Goal: Ability to describe self-care measures that may prevent or decrease complications (Diabetes Survival Skills Education) will improve Outcome: Progressing Goal: Individualized Educational Video(s) Outcome: Progressing   Problem: Coping: Goal: Ability to adjust to condition or change in health will improve Outcome: Progressing   Problem: Fluid Volume: Goal: Ability to maintain a balanced intake and output will improve Outcome: Progressing   Problem: Health Behavior/Discharge Planning: Goal: Ability to identify and utilize available resources and services will improve Outcome: Progressing Goal: Ability to manage health-related needs will improve Outcome: Progressing   Problem: Metabolic: Goal: Ability to maintain appropriate glucose levels will improve Outcome: Progressing   Problem: Nutritional: Goal: Maintenance of adequate nutrition will improve Outcome: Progressing Goal: Progress toward achieving an optimal weight will improve Outcome: Progressing   Problem: Skin Integrity: Goal: Risk for impaired skin integrity will decrease Outcome: Progressing   Problem: Tissue Perfusion: Goal: Adequacy of tissue perfusion will improve Outcome: Progressing   Problem: Activity: Goal: Ability to tolerate increased activity will improve Outcome: Progressing   Problem: Respiratory: Goal: Ability to maintain a clear airway and adequate ventilation will improve Outcome: Progressing   Problem: Role Relationship: Goal: Method of communication will improve Outcome: Progressing   Problem: Fluid Volume: Goal: Hemodynamic stability will improve Outcome: Progressing   Problem: Clinical Measurements: Goal: Diagnostic test results will improve Outcome: Progressing Goal: Signs and symptoms of infection will decrease Outcome: Progressing   Problem: Respiratory: Goal: Ability to maintain adequate ventilation will  improve Outcome: Progressing

## 2023-10-24 NOTE — Progress Notes (Addendum)
 [facility] SURGICAL PROGRESS NOTE  Hospital Day(s): 19.   Post op day(s): 2 Days Post-Op.   Interval History:  Patient seen and examined Had a good day yesterday. Last night became more agitate and combative requiring Haldol /Zyprexa Very somnolent this AM secondary to the above Discussed with RN, no issue with TF yesterday, no emesis  Leukocytosis resolved; WBC 8.1K Hgb to 8.8 Renal function stable/improved; sCr - 1.40; UO - unmeasured Mild hypokalemia to 3.3 Started on trickle feeds; 20 ml/hr He is on Zosyn  (day 2/4)  Vital signs in last 24 hours: [min-max] current  Temp:  [98.1 F (36.7 C)-98.6 F (37 C)] 98.6 F (37 C) (05/28 2132) Pulse Rate:  [52-92] 92 (05/28 2132) Resp:  [20] 20 (05/28 2132) BP: (112-137)/(55-90) 129/55 (05/28 2132) SpO2:  [92 %-95 %] 92 % (05/28 2132) Weight:  [102.5 kg] 102.5 kg (05/29 0500)     Height: 5' 9.02" (175.3 cm) Weight: 102.5 kg BMI (Calculated): 33.35   Intake/Output last 2 shifts:  05/28 0701 - 05/29 0700 In: 50 [IV Piggyback:50] Out: -    Physical Exam:  Constitutional: Somnolent, NAD Respiratory: breathing non-labored at rest  Gastrointestinal: soft, and non-distended. G-tube in left abdomen; site CDI. TF running at 20 ml/hr. Abdominal binder in place Integumentary: Laparotomy is CDI with staples, no drainage on honeycomb, no erythema   Labs:     Latest Ref Rng & Units 10/24/2023    5:19 AM 10/23/2023    4:02 AM 10/22/2023    3:15 AM  CBC  WBC 4.0 - 10.5 K/uL 8.1  12.6  19.7   Hemoglobin 13.0 - 17.0 g/dL 8.8  9.9  16.1   Hematocrit 39.0 - 52.0 % 25.6  29.1  32.1   Platelets 150 - 400 K/uL 267  273  286       Latest Ref Rng & Units 10/24/2023    5:19 AM 10/23/2023    4:02 AM 10/22/2023    3:15 AM  CMP  Glucose 70 - 99 mg/dL 096  045  409   BUN 8 - 23 mg/dL 23  24  24    Creatinine 0.61 - 1.24 mg/dL 8.11  9.14  7.82   Sodium 135 - 145 mmol/L 141  137  136   Potassium 3.5 - 5.1 mmol/L 3.3  3.9  4.1    Chloride 98 - 111 mmol/L 109  103  104   CO2 22 - 32 mmol/L 25  24  22    Calcium 8.9 - 10.3 mg/dL 7.7  7.9  7.6   Total Protein 6.5 - 8.1 g/dL  6.3    Total Bilirubin 0.0 - 1.2 mg/dL  0.8    Alkaline Phos 38 - 126 U/L  62    AST 15 - 41 U/L  17    ALT 0 - 44 U/L  17      Imaging studies: No new pertinent imaging studies   Assessment/Plan:  83 y.o. male 2 Days Post-Op s/p exploratory laparotomy, closure of gastrostomy, and replacement of gastrostomy tube, complicated by end stage achalasia    - Okay to begin to advance tube feedings to goal; would recommend doing so slowly over next 24-48 hours  - Okay to utilize gastrostomy tube for medications as well  - Continue IV Abx (Zosyn  - Day 2/4)  - Continue abdominal binder, safety mittens as needed to ensure does no pull tube  - Monitor abdominal examination; on-going bowel function  - Replete electrolytes; monitor   -  Further management per primary service; we will follow for now    All of the above findings and recommendations were discussed with the patient's medical team.   -- Apolonio Bay, PA-C [facility] 10/24/2023, 7:26 AM M-F: 7am - 4pm

## 2023-10-24 NOTE — Progress Notes (Signed)
 Inpatient Rehab Admissions Coordinator:   I spoke to pt's granddaughter on the phone.  At this point, pt is relatively stable following his abdominal surgery.  GI has signed off, as they are unable to provide any further interventions in this hospital (would need transfer to a tertiary center from what I understand).  Post surgically, pt is doing well, WBCs have returned to normal, hgb is stable, and his wound does not have a vac.  Functionally he is mobilizing okay.  I do not think that [facility] will review this for admission to CIR after already having denied.  She voiced understanding.  I will sign off.   Loye Rumble, PT, DPT Admissions Coordinator [phone] 10/24/23  4:57 PM

## 2023-10-24 NOTE — TOC Progression Note (Signed)
 Transition of Care [facility]) - Progression Note    Patient Details  Name: Robert Vega MRN: [id number] Date of Birth: January 19, 1941  Transition of [facility]) CM/SW Contact  Marino Sias, RN Phone Number: 10/24/2023, 10:55 AM  Clinical Narrative: Attempted to call granddaughter, Danelle Dunning to discuss SNF bed offer, no answer, left message to return call.         Expected Discharge Plan and Services                                               Social Determinants of Health (SDOH) Interventions SDOH Screenings   Food Insecurity: Patient Unable To Answer (10/06/2023)  Recent Concern: Food Insecurity - Food Insecurity Present (09/11/2023)   Received from [facility]  Housing: Patient Unable To Answer (10/06/2023)  Recent Concern: Housing - High Risk (09/11/2023)   Received from [facility]  Transportation Needs: Patient Unable To Answer (10/06/2023)  Utilities: Patient Unable To Answer (10/06/2023)  Depression (PHQ2-9): Low Risk  (05/07/2023)  Financial Resource Strain: Medium Risk (09/11/2023)   Received from [facility]  Social Connections: Unknown (10/06/2023)  Tobacco Use: Medium Risk (10/22/2023)    Readmission Risk Interventions     No data to display

## 2023-10-24 NOTE — Progress Notes (Signed)
 Pt with increasing agitation and confusion. Granddaughter [name] and sig other spoke with him on the phone but he continued to pull at equipment and swat at staff attempting to assist him. Zyprexa  administered. Safety sitter at the bedside.

## 2023-10-24 NOTE — Progress Notes (Signed)
 PROGRESS NOTE    Robert Vega  RUE:[id number] DOB: 1940-12-30 DOA: 10/05/2023 PCP: Helaine Llanos, MD    Brief Narrative:  83 y.o male with significant PMH of OSA, GERD, Obesity, HTN, Dysphagia: EGD 03/2022 with food in upper esophagus complicated by aspiration event, cardiac arrest with round of CPR, and post resuscitation EGD with concern for lack of peristalsis. Prior EGD 08/2020 with note of abnormal cricopharyngeus, decrease in motility in esophagus, and spastic LES who presented to the ED from with hypoxia, fever and generalized weakness.  Patient developed acute respiratory failure requiring intubation due to aspiration pneumonia, he was treated with broad-spectrum antibiotics and managed in ICU. Condition had improved, extubated 5/15, but has significant agitation required brief course of Precedex . 5/10: Admit to [facility] service with sepsis due to Aspiration Pneumonia.  Course complicated by Acute Respiratory Failure due to Aspiration of vomitus requiring s/p cardiac arrest  transfer to ICU and intubation.  5/11 flexible bronchoscopy done by critical care team 5/12 remains intubated 5/13 remains on vent, s/p PEG tube, +vomiting last night, failed SAT/SBT 5/14 extubated successfully but with severe delirium 5/20.  Botulinum toxin injection into the lower esophageal sphincter by Dr. Ole Berkeley   5/21 esophagram showing diffusely distended esophagus with distal obstruction and severe dysmotility suggestive of achalasia. 5/22.  Advised I would not give him any food and will continue PEG feeding he can try liquids to see if that goes down but still high risk for aspiration.  Patient had a fall on the evening of 5/22. 5/23.  Patient feels okay.  Awaken from sleep.  Did not offer any complaints.  Awaiting insurance authorization for rehab. 5/24.  Patient feeling okay.  States he is doing okay with the liquid diet.  Still feels weak. 5/25.  Patient pulled out PEG tube this morning.  Foley catheter  placed in the stoma.  GI able to place a another PEG tube and remove the Foley. 5/26.  Creatinine up at 1.83.  Continue IV fluid hydration.  Patient started having pain after tube feeding started.  CT scan showed that the PEG tube was not in the correct place.  Notified gastroenterology.  Started empiric antibiotics. 5/27.  Patient was taken urgently to the operating room by Dr. Cornel Diesel because the patient had fever and elevated white count with suspected intra-abdominal sepsis. 5/29: Agitation/sundowning noted around shift change 5/28.  Suspect hospital-acquired delirium.  Received doses of atypical antipsychotics.  Much more calm, awake, alert in AM.  Assessment & Plan:   Principal Problem:   Severe sepsis (HCC) Active Problems:   Gastrostomy complication (HCC)   Dysphagia   Achalasia of esophagus   AKI (acute kidney injury) (HCC)   Acute delirium   Acute respiratory failure with hypoxia and hypercapnia (HCC)   Essential hypertension, benign   GERD (gastroesophageal reflux disease)   Multifocal pneumonia   History of dysphagia   Aspiration pneumonia (HCC)   Obesity (BMI 30-39.9)   Generalized weakness   Generalized abdominal pain   Severe sepsis (HCC) Recurrence with different problem.  Intra-abdominal infection secondary to PEG tube not being in the right place.  Elevated white count and fever and acute kidney injury.  Patient brought emergently to the operating room on 5/27 by Dr. Cornel Diesel.   Plan: Okay for tube feeds N.p.o. indefinitely Continue IV Zosyn  for now  Gastrostomy complication [facility]) Patient self discontinued PEG tube and malpositioning likely resulted in some intra-abdominal sepsis.  Tube feeds were held.  Okay to restart on 5/28  AKI (acute kidney injury) (HCC) Free water  flushes via NGT   Achalasia of esophagus Status post botulinum toxin injection on 5/20.   Esophagram showing dilated esophagus distal obstruction. Per GI esophagram continues to demonstrate  severe dysmotility with dilated esophagus.  Consistent with end-stage esophageal disease.  Patient is n.p.o. indefinitely.  Should only be fed via PEG tube considering the risk of aspiration.   Dysphagia Likely secondary to achalasia.  Patient had Botox injection of the lower esophageal sphincter due to lower esophageal sphincter hypertension causing the patient not being able to swallow and empty the esophagus of his ingested food.  Food was in the esophagus on the previous EGD.  Currently n.p.o. as directed by general surgery     Acute delirium Agitation/sundowning Continue as needed Haldol .  Discontinue Reglan  Seroquel  25 mg per tube nightly.  Escalate as needed   Acute respiratory failure with hypoxia and hypercapnia (HCC) Pneumonia Patient now on room air   Essential hypertension, benign Continue holding Norvasc    GERD (gastroesophageal reflux disease) PPI     Generalized weakness Therapy evaluations   Obesity (BMI 30-39.9) BMI 33.16    DVT prophylaxis: SCD Code Status: Full Family Communication: Attempted to call granddaughter [name] [phone].  No answer.  Voicemail not left on general voicemail message Disposition Plan: Status is: Inpatient Remains inpatient appropriate because: Multiple acute issues as above   Level of care: Progressive  Consultants:  General surgery  Procedures:  Ex lap  Antimicrobials: Zosyn    Subjective: Seen and examined.  Resting in bed.  More calm this morning.  Objective: Vitals:   10/23/23 1613 10/23/23 2132 10/24/23 0500 10/24/23 0730  BP: 120/76 (!) 129/55  111/62  Pulse: (!) 52 92  76  Resp:  20  19  Temp: 98.1 F (36.7 C) 98.6 F (37 C)  99 F (37.2 C)  TempSrc:    Axillary  SpO2: 95% 92%  92%  Weight:   102.5 kg   Height:        Intake/Output Summary (Last 24 hours) at 10/24/2023 1124 Last data filed at 10/24/2023 1000 Gross per 24 hour  Intake 50 ml  Output 300 ml  Net -250 ml   Filed Weights    10/19/23 0500 10/23/23 0500 10/24/23 0500  Weight: 101.9 kg 103.7 kg 102.5 kg    Examination:  General exam: NAD Respiratory system: Clear to auscultation. Respiratory effort normal. Cardiovascular system: S1-S2, RRR, no murmurs, no pedal edema Gastrointestinal system: Soft, NT ND, + PEG Central nervous system: Alert and oriented x 2. No focal neurological deficits. Extremities: Symmetric 5 x 5 power. Skin: No rashes, lesions or ulcers Psychiatry: Judgement and insight appear normal. Mood & affect appropriate.     Data Reviewed: I have personally reviewed following labs and imaging studies  CBC: Recent Labs  Lab 10/21/23 0714 10/22/23 0100 10/22/23 0315 10/23/23 0402 10/24/23 0519  WBC 7.4 18.7* 19.7* 12.6* 8.1  NEUTROABS 5.1 16.4* 17.2*  --   --   HGB 11.1* 11.3* 11.0* 9.9* 8.8*  HCT 33.6* 32.9* 32.1* 29.1* 25.6*  MCV 96.3 94.5 94.1 95.1 94.1  PLT 300 300 286 273 267   Basic Metabolic Panel: Recent Labs  Lab 10/21/23 0714 10/22/23 0100 10/22/23 0315 10/23/23 0402 10/24/23 0519  NA 141 135 136 137 141  K 4.3 4.3 4.1 3.9 3.3*  CL 106 104 104 103 109  CO2 27 23 22 24 25   GLUCOSE 119* 118* 112* 125* 125*  BUN 30* 26* 24* 24* 23  CREATININE 1.83* 1.55* 1.52* 1.63* 1.40*  CALCIUM 8.1* 7.9* 7.6* 7.9* 7.7*   GFR: Estimated Creatinine Clearance: 46.3 mL/min (A) (by C-G formula based on SCr of 1.4 mg/dL (H)). Liver Function Tests: Recent Labs  Lab 10/22/23 0100 10/23/23 0402  AST 21 17  ALT 21 17  ALKPHOS 78 62  BILITOT 0.6 0.8  PROT 6.6 6.3*  ALBUMIN 2.8* 2.4*   No results for input(s): "LIPASE", "AMYLASE" in the last 168 hours. No results for input(s): "AMMONIA" in the last 168 hours. Coagulation Profile: Recent Labs  Lab 10/22/23 0100  INR 1.2   Cardiac Enzymes: No results for input(s): "CKTOTAL", "CKMB", "CKMBINDEX", "TROPONINI" in the last 168 hours. BNP (last 3 results) No results for input(s): "PROBNP" in the last 8760 hours. HbA1C: No  results for input(s): "HGBA1C" in the last 72 hours. CBG: Recent Labs  Lab 10/23/23 0803 10/23/23 1159 10/23/23 1610 10/23/23 2131 10/24/23 0746  GLUCAP 109* 109* 121* 112* 125*   Lipid Profile: No results for input(s): "CHOL", "HDL", "LDLCALC", "TRIG", "CHOLHDL", "LDLDIRECT" in the last 72 hours. Thyroid  Function Tests: No results for input(s): "TSH", "T4TOTAL", "FREET4", "T3FREE", "THYROIDAB" in the last 72 hours. Anemia Panel: No results for input(s): "VITAMINB12", "FOLATE", "FERRITIN", "TIBC", "IRON", "RETICCTPCT" in the last 72 hours. Sepsis Labs: Recent Labs  Lab 10/22/23 0100 10/22/23 0315  LATICACIDVEN 1.3 1.1    Recent Results (from the past 240 hours)  Culture, blood (x 2)     Status: None (Preliminary result)   Collection Time: 10/22/23  1:00 AM   Specimen: BLOOD  Result Value Ref Range Status   Specimen Description BLOOD BLOOD RIGHT ARM  Final   Special Requests   Final    BOTTLES DRAWN AEROBIC AND ANAEROBIC Blood Culture adequate volume   Culture   Final    NO GROWTH 2 DAYS Performed at [facility], [address] [place], [place] [address]    Report Status PENDING  Incomplete  Culture, blood (x 2)     Status: None (Preliminary result)   Collection Time: 10/22/23  1:00 AM   Specimen: BLOOD  Result Value Ref Range Status   Specimen Description BLOOD BLOOD RIGHT ARM  Final   Special Requests   Final    BOTTLES DRAWN AEROBIC AND ANAEROBIC Blood Culture adequate volume   Culture   Final    NO GROWTH 2 DAYS Performed at [facility], [address] [place], [place] [address]    Report Status PENDING  Incomplete         Radiology Studies: DG Abd 1 View Result Date: 10/22/2023 CLINICAL DATA:  Nasogastric tube present. EXAM: ABDOMEN - 1 VIEW COMPARISON:  CT earlier today FINDINGS: Tip and side port of the enteric tube below the diaphragm in the stomach. Midline skin staples and possible upper abdominal drain. IMPRESSION: Tip and  side port of the enteric tube below the diaphragm in the stomach. Electronically Signed   By: Chadwick Colonel M.D.   On: 10/22/2023 15:23        Scheduled Meds:  Chlorhexidine  Gluconate Cloth  6 each Topical Q0600   free water   30 mL Per Tube Q4H   gentamicin ointment   Topical TID   insulin  aspart  0-9 Units Subcutaneous Q4H   pantoprazole  (PROTONIX ) IV  40 mg Intravenous Q24H   QUEtiapine   25 mg Per Tube QHS   Continuous Infusions:  feeding supplement (OSMOLITE 1.5 CAL) 1,000 mL (10/23/23 1400)   piperacillin -tazobactam (ZOSYN )  IV 3.375  g (10/24/23 0603)     LOS: 19 days      Tiajuana Fluke, MD [facility]   If 7PM-7AM, please contact night-coverage  10/24/2023, 11:24 AM

## 2023-10-25 DIAGNOSIS — A419 Sepsis, unspecified organism: Secondary | ICD-10-CM | POA: Diagnosis not present

## 2023-10-25 DIAGNOSIS — R6520 Severe sepsis without septic shock: Secondary | ICD-10-CM | POA: Diagnosis not present

## 2023-10-25 LAB — BASIC METABOLIC PANEL WITH GFR
Anion gap: 8 (ref 5–15)
BUN: 20 mg/dL (ref 8–23)
CO2: 28 mmol/L (ref 22–32)
Calcium: 8 mg/dL — ABNORMAL LOW (ref 8.9–10.3)
Chloride: 107 mmol/L (ref 98–111)
Creatinine, Ser: 1.62 mg/dL — ABNORMAL HIGH (ref 0.61–1.24)
GFR, Estimated: 42 mL/min — ABNORMAL LOW (ref >60)
Glucose, Bld: 124 mg/dL — ABNORMAL HIGH (ref 70–99)
Potassium: 3.2 mmol/L — ABNORMAL LOW (ref 3.5–5.1)
Sodium: 143 mmol/L (ref 135–145)

## 2023-10-25 LAB — GLUCOSE, CAPILLARY
Glucose-Capillary: 123 mg/dL — ABNORMAL HIGH (ref 70–99)
Glucose-Capillary: 140 mg/dL — ABNORMAL HIGH (ref 70–99)
Glucose-Capillary: 101 mg/dL — ABNORMAL HIGH (ref 70–99)
Glucose-Capillary: 122 mg/dL — ABNORMAL HIGH (ref 70–99)
Glucose-Capillary: 100 mg/dL — ABNORMAL HIGH (ref 70–99)

## 2023-10-25 LAB — MAGNESIUM: Magnesium: 2.2 mg/dL (ref 1.7–2.4)

## 2023-10-25 LAB — PHOSPHORUS: Phosphorus: 3.3 mg/dL (ref 2.5–4.6)

## 2023-10-25 MED ORDER — QUETIAPINE FUMARATE 25 MG PO TABS
25.0000 mg | ORAL_TABLET | Freq: Every day | ORAL | Status: DC
  Administered 2023-10-25 – 2023-10-29 (×5): 25 mg
  Filled 2023-10-25 (×5): qty 1

## 2023-10-25 MED ORDER — POTASSIUM CHLORIDE 20 MEQ/15ML (10%) PO SOLN
40.0000 meq | Freq: Once | ORAL | Status: AC
  Administered 2023-10-25: 40 meq
  Filled 2023-10-25: qty 30

## 2023-10-25 NOTE — Progress Notes (Signed)
 Physical Therapy Treatment Patient Details Name: Robert Vega MRN: [id number] DOB: 1941-03-14 Today's Date: 10/25/2023   History of Present Illness 83 y.o male with significant PMH of OSA, GERD, Obesity, HTN, Dysphagia: EGD 03/2022 with food in upper esophagus complicated by aspiration event, cardiac arrest with round of CPR, and post resuscitation EGD with concern for lack of peristalsis. Pt presented to ED on 10/05/2023 with hypoxia, fever and generalized weakness; developed acute respiratory failure requiring intubation 5/10 due to aspiration pneumonia, extubated 5/15, but had significant agitation required brief course of Precedex ; pt now s/p exploratory laparotomy with closure of gastrotomy and insertion of gastrostomy tube on 10/22/23    PT Comments  Pt was supine in bed with HOB elevated 30 degrees. Feeding paused throughout session. Removed BUE mitts with safety sitter still present. Pt is impulsive and easily distracted. Constant vsc for redirecting and focusing on desired task. Pt was able to tolerate getting OOB nd ambulating ~ 75 with pushing IV pole for support. Pt is still at risk of falls and does require constant vcs for posture correction. Acute PT will continue to follow per current POC. Dc recs remain appropriate.    If plan is discharge home, recommend the following: A lot of help with walking and/or transfers;A lot of help with bathing/dressing/bathroom;Help with stairs or ramp for entrance;Assist for transportation;Direct supervision/assist for financial management;Assistance with cooking/housework;Direct supervision/assist for medications management     Equipment Recommendations  Other (comment) (Defer to next levle of care)       Precautions / Restrictions Precautions Precautions: Fall Recall of Precautions/Restrictions: Impaired Precaution/Restrictions Comments: abdominal binder Restrictions Weight Bearing Restrictions Per Provider Order: No     Mobility  Bed  Mobility Overal bed mobility: Needs Assistance Bed Mobility: Supine to Sit, Sit to Supine  Supine to sit: Mod assist, HOB elevated  General bed mobility comments: Mod assist to progress for supine to EOB sitting. Vcs for safety and sequencing. pt is impulsive    Transfers Overall transfer level: Needs assistance Equipment used: None (Used HHA or IV pole for support) Transfers: Sit to/from Stand Sit to Stand: Min assist  General transfer comment: stood from EOB surface several times throughout session. pt is easily distracted and needs vcs/tcs for safe technique    Ambulation/Gait Ambulation/Gait assistance: Min assist Gait Distance (Feet): 75 Feet Assistive device: 1 person hand held assist, IV Pole Gait Pattern/deviations: Step-through pattern, Decreased step length - right, Decreased step length - left, Decreased stride length, Trunk flexed Gait velocity: decreased  General Gait Details: Pt tends to have poor gait posture. Needs vcs for posture correction throughout ambulation        Communication Communication Communication: Impaired Factors Affecting Communication: Reduced clarity of speech (mumbling speech)  Cognition Arousal: Alert Behavior During Therapy: Impulsive   PT - Cognitive impairments: No apparent impairments   Orientation impairments: Situation, Time    PT - Cognition Comments: patient confused, requires multimodal cues for safety, attending to task. Following commands: Impaired Following commands impaired: Follows one step commands inconsistently, Follows one step commands with increased time    Cueing Cueing Techniques: Verbal cues, Gestural cues, Tactile cues     General Comments General comments (skin integrity, edema, etc.): vss throughout, abdominal binder in place pre/post session, sitter in room throughout, B mitts donned      Pertinent Vitals/Pain Pain Assessment Pain Assessment: No/denies pain     PT Goals (current goals can now be found  in the care plan section) Acute Rehab PT Goals  Patient Stated Goal: none stated Progress towards PT goals: Progressing toward goals    Frequency    Min 3X/week           Co-evaluation     PT goals addressed during session: Mobility/safety with mobility;Balance;Proper use of DME;Strengthening/ROM        AM-PAC PT "6 Clicks" Mobility   Outcome Measure  Help needed turning from your back to your side while in a flat bed without using bedrails?: A Lot Help needed moving from lying on your back to sitting on the side of a flat bed without using bedrails?: A Lot Help needed moving to and from a bed to a chair (including a wheelchair)?: A Lot Help needed standing up from a chair using your arms (e.g., wheelchair or bedside chair)?: A Little Help needed to walk in hospital room?: A Lot Help needed climbing 3-5 steps with a railing? : A Lot 6 Click Score: 13    End of Session   Activity Tolerance: Patient tolerated treatment well Patient left: Other (comment) (with OT at conclusion of PT session) Nurse Communication: Mobility status PT Visit Diagnosis: Unsteadiness on feet (R26.81);Repeated falls (R29.6);Muscle weakness (generalized) (M62.81);Difficulty in walking, not elsewhere classified (R26.2);Other abnormalities of gait and mobility (R26.89);Pain     Time: 1610-9604 PT Time Calculation (min) (ACUTE ONLY): 13 min  Charges:    $Gait Training: 8-22 mins PT General Charges $$ ACUTE PT VISIT: 1 Visit                    Chester Costa PTA 10/25/23, 4:10 PM

## 2023-10-25 NOTE — Progress Notes (Signed)
 Nutrition Follow-up  DOCUMENTATION CODES:   Obesity unspecified  INTERVENTION:   -Continue TF via PEG:    Osmolite 1.5 @ 60 ml/hr   60 ml Prosource TF20 daily   30 ml free water  flush every 4 hours     Tube feeding regimen provides 2240 kcal (100% of needs), 110 grams of protein, and 1097 ml of H2O. Total free water : 1277 ml daily   NUTRITION DIAGNOSIS:   Inadequate oral intake related to inability to eat (pt sedated and ventilated) as evidenced by NPO status.  Ongoing  GOAL:   Patient will meet greater than or equal to 90% of their needs  Met with TF  MONITOR:   Diet advancement, Labs, Weight trends, TF tolerance, I & O's, Skin  REASON FOR ASSESSMENT:   Consult Assessment of nutrition requirement/status  ASSESSMENT:   83 y/o male with h/o GERD, esophageal dysphagia, HTN, BPH, mood disorder and hiatal hernia who is admitted with aspiration event, PNA, sepsis, cardiac arrest and dysphagia.  5/13- s/p EGD- found to have food in the entire esophagus and gastric polyps. PEG tube placed  5/14- extubated 5/15- TF re-started 5/20- s/p EGD- esophageal dilation with botox injection 5/22- advanced to clear liquid diet 5/24- pt pulled out PEG 5/25- PEG replaced by GI 5/26- KUB revealed some stool in the rectum and paucity of air in the colon, CT revealed G-tube not in the stomach, has spillage of contrast into the peritoneal cavity and moderate amount of pneumoperitoneum, TF d/c  5/27- s/p Exploratory laparotomy, takedown of gastrocutaneous fistula and repair of gastrotomy, placement of gastrostomy tube  5/28- TF resumed  Reviewed I/O's: -1.7 L x 24 hours and -3.5 L since 10/11/23  UOP: 1.8 L x 24 hours  Per GI notes, no plans to PO intake due to high aspiration intake and end-stage esophageal disease. Pt remains NPO and receiving TF via PEG for sole source nutrition.   Pt lying in bed at time of visit. Safety sitter at bedside, who reports pt has been sleeping for  most of the shift. Osmolite 1.5 infusing at 60 ml/hr via PEG. Pt tolerating well.   Wt has been stable over the past week.   Medications reviewed and include protonix .   Per TOC notes, plan for SNF placement at discharge. CIR admission has been denied.   Labs reviewed: K: 3.2, CBGS: 88-140 (inpatient orders for glycemic control are 0-9 units insulin  aspart every 4 hours).    Diet Order:   Diet Order             Diet NPO time specified  Diet effective midnight                   EDUCATION NEEDS:   No education needs have been identified at this time  Skin:  Skin Assessment: Skin Integrity Issues: Skin Integrity Issues:: Other (Comment) Other: skin tear to lt lower arm  Last BM:  10/25/23 (type 6)  Height:   Ht Readings from Last 1 Encounters:  10/08/23 5' 9.02" (1.753 m)    Weight:   Wt Readings from Last 1 Encounters:  10/24/23 102.5 kg    Ideal Body Weight:  72.7 kg  BMI:  Body mass index is 33.35 kg/m.  Estimated Nutritional Needs:   Kcal:  2000-2200  Protein:  90-105 grams  Fluid:  1.8-2.1L/day    Herschel Lords, RD, LDN, CDCES Registered Dietitian III Certified Diabetes Care and Education Specialist If unable to reach this RD, please use "RD  Inpatient" group chat on secure chat between hours of 8am-4 pm daily

## 2023-10-25 NOTE — Progress Notes (Signed)
 [facility] SURGICAL PROGRESS NOTE  Hospital Day(s): 20.   Post op day(s): 3 Days Post-Op.   Interval History:  Patient seen and examined Nothing acute overnight  He is more alert this morning; still certainly very confused  Renal function stable; sCr - 1.62; UO - 1800 ccs Mild hypokalemia to 3.2 On TF; 60 ml/hr He is on Zosyn  (day 3/4)  Vital signs in last 24 hours: [min-max] current  Temp:  [98 F (36.7 C)-99 F (37.2 C)] 98 F (36.7 C) (05/30 0449) Pulse Rate:  [72-88] 87 (05/30 0449) Resp:  [17-19] 18 (05/30 0449) BP: (96-167)/(61-89) 96/61 (05/30 0449) SpO2:  [92 %-100 %] 94 % (05/30 0449)     Height: 5' 9.02" (175.3 cm) Weight: 102.5 kg BMI (Calculated): 33.35   Intake/Output last 2 shifts:  05/29 0701 - 05/30 0700 In: 150 [IV Piggyback:150] Out: 1800 [Urine:1800]   Physical Exam:  Constitutional: Alert, confused, NAD Respiratory: breathing non-labored at rest  Gastrointestinal: soft, and non-distended. G-tube in left abdomen; site CDI. TF running at 60 ml/hr. Abdominal binder in place Integumentary: Laparotomy is CDI with staples, no drainage on honeycomb, no erythema   Labs:     Latest Ref Rng & Units 10/24/2023    5:19 AM 10/23/2023    4:02 AM 10/22/2023    3:15 AM  CBC  WBC 4.0 - 10.5 K/uL 8.1  12.6  19.7   Hemoglobin 13.0 - 17.0 g/dL 8.8  9.9  16.1   Hematocrit 39.0 - 52.0 % 25.6  29.1  32.1   Platelets 150 - 400 K/uL 267  273  286       Latest Ref Rng & Units 10/25/2023    6:00 AM 10/24/2023    5:19 AM 10/23/2023    4:02 AM  CMP  Glucose 70 - 99 mg/dL 096  045  409   BUN 8 - 23 mg/dL 20  23  24    Creatinine 0.61 - 1.24 mg/dL 8.11  9.14  7.82   Sodium 135 - 145 mmol/L 143  141  137   Potassium 3.5 - 5.1 mmol/L 3.2  3.3  3.9   Chloride 98 - 111 mmol/L 107  109  103   CO2 22 - 32 mmol/L 28  25  24    Calcium 8.9 - 10.3 mg/dL 8.0  7.7  7.9   Total Protein 6.5 - 8.1 g/dL   6.3   Total Bilirubin 0.0 - 1.2 mg/dL   0.8   Alkaline Phos 38  - 126 U/L   62   AST 15 - 41 U/L   17   ALT 0 - 44 U/L   17     Imaging studies: No new pertinent imaging studies   Assessment/Plan:  83 y.o. male 3 Days Post-Op s/p exploratory laparotomy, closure of gastrostomy, and replacement of gastrostomy tube, complicated by end stage achalasia    - Okay to advance TF to goal  - Okay to utilize gastrostomy tube for medications as well  - Continue IV Abx (Zosyn  - Day 3/4)  - Continue abdominal binder, safety mittens as needed to ensure does no pull tube  - Monitor abdominal examination; on-going bowel function  - Replete electrolytes; monitor   - Further management per primary service  Nothing further from surgical perspective, we will follow peripherally for now. TF can be advanced to goal. Unable to tolerate PO given end stage achalasia. Honeycomb can be removed at any time. He will need staple removal in 10-14  days. Would recommend continuing abdominal binder +/- safety mittens to ensure he does not pull G-tube. Pease call with questions/concerns.   All of the above findings and recommendations were discussed with the patient's medical team.   -- Apolonio Bay, PA-C [facility] 10/25/2023, 7:10 AM M-F: 7am - 4pm

## 2023-10-25 NOTE — Plan of Care (Signed)
  Problem: Education: Goal: Knowledge of General Education information will improve Description: Including pain rating scale, medication(s)/side effects and non-pharmacologic comfort measures Outcome: Progressing   Problem: Health Behavior/Discharge Planning: Goal: Ability to manage health-related needs will improve Outcome: Progressing   Problem: Clinical Measurements: Goal: Ability to maintain clinical measurements within normal limits will improve Outcome: Progressing Goal: Will remain free from infection Outcome: Progressing Goal: Diagnostic test results will improve Outcome: Progressing Goal: Respiratory complications will improve Outcome: Progressing Goal: Cardiovascular complication will be avoided Outcome: Progressing   Problem: Activity: Goal: Risk for activity intolerance will decrease Outcome: Progressing   Problem: Nutrition: Goal: Adequate nutrition will be maintained Outcome: Progressing   Problem: Coping: Goal: Level of anxiety will decrease Outcome: Progressing   Problem: Elimination: Goal: Will not experience complications related to bowel motility Outcome: Progressing Goal: Will not experience complications related to urinary retention Outcome: Progressing   Problem: Pain Managment: Goal: General experience of comfort will improve and/or be controlled Outcome: Progressing   Problem: Safety: Goal: Ability to remain free from injury will improve Outcome: Progressing   Problem: Skin Integrity: Goal: Risk for impaired skin integrity will decrease Outcome: Progressing   Problem: Activity: Goal: Ability to tolerate increased activity will improve Outcome: Progressing   Problem: Clinical Measurements: Goal: Ability to maintain a body temperature in the normal range will improve Outcome: Progressing   Problem: Respiratory: Goal: Ability to maintain adequate ventilation will improve Outcome: Progressing Goal: Ability to maintain a clear airway  will improve Outcome: Progressing   Problem: Education: Goal: Ability to describe self-care measures that may prevent or decrease complications (Diabetes Survival Skills Education) will improve Outcome: Progressing Goal: Individualized Educational Video(s) Outcome: Progressing   Problem: Coping: Goal: Ability to adjust to condition or change in health will improve Outcome: Progressing   Problem: Fluid Volume: Goal: Ability to maintain a balanced intake and output will improve Outcome: Progressing   Problem: Health Behavior/Discharge Planning: Goal: Ability to identify and utilize available resources and services will improve Outcome: Progressing Goal: Ability to manage health-related needs will improve Outcome: Progressing   Problem: Metabolic: Goal: Ability to maintain appropriate glucose levels will improve Outcome: Progressing   Problem: Nutritional: Goal: Maintenance of adequate nutrition will improve Outcome: Progressing Goal: Progress toward achieving an optimal weight will improve Outcome: Progressing   Problem: Skin Integrity: Goal: Risk for impaired skin integrity will decrease Outcome: Progressing   Problem: Tissue Perfusion: Goal: Adequacy of tissue perfusion will improve Outcome: Progressing   Problem: Activity: Goal: Ability to tolerate increased activity will improve Outcome: Progressing   Problem: Respiratory: Goal: Ability to maintain a clear airway and adequate ventilation will improve Outcome: Progressing   Problem: Role Relationship: Goal: Method of communication will improve Outcome: Progressing   Problem: Fluid Volume: Goal: Hemodynamic stability will improve Outcome: Progressing   Problem: Clinical Measurements: Goal: Diagnostic test results will improve Outcome: Progressing Goal: Signs and symptoms of infection will decrease Outcome: Progressing   Problem: Respiratory: Goal: Ability to maintain adequate ventilation will  improve Outcome: Progressing

## 2023-10-25 NOTE — Progress Notes (Signed)
 PROGRESS NOTE    Robert Vega  YNW:[id number] DOB: 15-Aug-1940 DOA: 10/05/2023 PCP: Helaine Llanos, MD    Brief Narrative:  83 y.o male with significant PMH of OSA, GERD, Obesity, HTN, Dysphagia: EGD 03/2022 with food in upper esophagus complicated by aspiration event, cardiac arrest with round of CPR, and post resuscitation EGD with concern for lack of peristalsis. Prior EGD 08/2020 with note of abnormal cricopharyngeus, decrease in motility in esophagus, and spastic LES who presented to the ED from with hypoxia, fever and generalized weakness.  Patient developed acute respiratory failure requiring intubation due to aspiration pneumonia, he was treated with broad-spectrum antibiotics and managed in ICU. Condition had improved, extubated 5/15, but has significant agitation required brief course of Precedex . 5/10: Admit to [facility] service with sepsis due to Aspiration Pneumonia.  Course complicated by Acute Respiratory Failure due to Aspiration of vomitus requiring s/p cardiac arrest  transfer to ICU and intubation.  5/11 flexible bronchoscopy done by critical care team 5/12 remains intubated 5/13 remains on vent, s/p PEG tube, +vomiting last night, failed SAT/SBT 5/14 extubated successfully but with severe delirium 5/20.  Botulinum toxin injection into the lower esophageal sphincter by Dr. Ole Berkeley   5/21 esophagram showing diffusely distended esophagus with distal obstruction and severe dysmotility suggestive of achalasia. 5/22.  Advised I would not give him any food and will continue PEG feeding he can try liquids to see if that goes down but still high risk for aspiration.  Patient had a fall on the evening of 5/22. 5/23.  Patient feels okay.  Awaken from sleep.  Did not offer any complaints.  Awaiting insurance authorization for rehab. 5/24.  Patient feeling okay.  States he is doing okay with the liquid diet.  Still feels weak. 5/25.  Patient pulled out PEG tube this morning.  Foley catheter  placed in the stoma.  GI able to place a another PEG tube and remove the Foley. 5/26.  Creatinine up at 1.83.  Continue IV fluid hydration.  Patient started having pain after tube feeding started.  CT scan showed that the PEG tube was not in the correct place.  Notified gastroenterology.  Started empiric antibiotics. 5/27.  Patient was taken urgently to the operating room by Dr. Cornel Diesel because the patient had fever and elevated white count with suspected intra-abdominal sepsis. 5/29: Agitation/sundowning noted around shift change 5/28.  Suspect hospital-acquired delirium.  Received doses of atypical antipsychotics.  Much more calm, awake, alert in AM.  Assessment & Plan:   Principal Problem:   Severe sepsis (HCC) Active Problems:   Gastrostomy complication (HCC)   Dysphagia   Achalasia of esophagus   AKI (acute kidney injury) (HCC)   Acute delirium   Acute respiratory failure with hypoxia and hypercapnia (HCC)   Essential hypertension, benign   GERD (gastroesophageal reflux disease)   Multifocal pneumonia   History of dysphagia   Aspiration pneumonia (HCC)   Obesity (BMI 30-39.9)   Generalized weakness   Generalized abdominal pain   Severe sepsis (HCC) Recurrence with different problem.  Intra-abdominal infection secondary to PEG tube not being in the right place.  Elevated white count and fever and acute kidney injury.  Patient brought emergently to the operating room on 5/27 by Dr. Cornel Diesel.   Plan: Escalate tube feeds to goal N.p.o. indefinitely Continue IV Zosyn  for now  Gastrostomy complication [facility]) Patient self discontinued PEG tube and malpositioning likely resulted in some intra-abdominal sepsis.  Tube feeds were held.  Restarted 5/28.  Slowly  escalating to goal   AKI (acute kidney injury) (HCC) Continue Free water  flushes via NGT   Achalasia of esophagus Status post botulinum toxin injection on 5/20.   Esophagram showing dilated esophagus distal obstruction. Per GI  esophagram continues to demonstrate severe dysmotility with dilated esophagus.  Consistent with end-stage esophageal disease.  Patient is n.p.o. indefinitely.  Should only be fed via PEG tube considering the risk of aspiration. Will need outpatient referral to tertiary advanced GI for consideration of POEMS procedure.  Recommend this only after a few weeks recovery period   Dysphagia Likely secondary to achalasia.  Patient had Botox  injection of the lower esophageal sphincter due to lower esophageal sphincter hypertension causing the patient not being able to swallow and empty the esophagus of his ingested food.  Food was in the esophagus on the previous EGD. Remains NPO     Acute delirium Agitation/sundowning Continue as needed Haldol .  Discontinue Reglan  Seroquel  50mg  per tube nightly Seroquel  25mg  daily   Acute respiratory failure with hypoxia and hypercapnia (HCC) Pneumonia Resolved   Essential hypertension, benign Continue holding Norvasc    GERD (gastroesophageal reflux disease) Continue PPI     Generalized weakness Therapy evaluations as tolerated   Obesity (BMI 30-39.9) BMI 33.16 Complicating factor    DVT prophylaxis: SCD Code Status: Full Family Communication: granddaughter [name] [phone] on 5/30.   Disposition Plan: Status is: Inpatient Remains inpatient appropriate because: Multiple acute issues as above   Level of care: Progressive  Consultants:  General surgery  Procedures:  Ex lap  Antimicrobials: Zosyn    Subjective: Seen and examined.  Calm this AM  Objective: Vitals:   10/24/23 2343 10/25/23 0449 10/25/23 0815 10/25/23 1209  BP: (!) 167/89 96/61 134/64 (!) 137/97  Pulse: 88 87 79 77  Resp: 18 18    Temp: 98.1 F (36.7 C) 98 F (36.7 C) 97.8 F (36.6 C)   TempSrc:  Oral Oral   SpO2: 100% 94% 94% 94%  Weight:      Height:        Intake/Output Summary (Last 24 hours) at 10/25/2023 1411 Last data filed at 10/25/2023 0445 Gross  per 24 hour  Intake 150 ml  Output 1500 ml  Net -1350 ml   Filed Weights   10/19/23 0500 10/23/23 0500 10/24/23 0500  Weight: 101.9 kg 103.7 kg 102.5 kg    Examination:  General exam: No acute distress Respiratory system: Bibasilar crackles, normal WOB, RA Cardiovascular system: S1-S2, RRR, no murmurs, no pedal edema Gastrointestinal system: Soft, NT ND, + PEG Central nervous system: Alert and oriented x 2. No focal neurological deficits. Extremities: Symmetric 5 x 5 power. Skin: No rashes, lesions or ulcers Psychiatry: Judgement and insight appear normal. Mood & affect appropriate.     Data Reviewed: I have personally reviewed following labs and imaging studies  CBC: Recent Labs  Lab 10/21/23 0714 10/22/23 0100 10/22/23 0315 10/23/23 0402 10/24/23 0519  WBC 7.4 18.7* 19.7* 12.6* 8.1  NEUTROABS 5.1 16.4* 17.2*  --   --   HGB 11.1* 11.3* 11.0* 9.9* 8.8*  HCT 33.6* 32.9* 32.1* 29.1* 25.6*  MCV 96.3 94.5 94.1 95.1 94.1  PLT 300 300 286 273 267   Basic Metabolic Panel: Recent Labs  Lab 10/22/23 0100 10/22/23 0315 10/23/23 0402 10/24/23 0519 10/25/23 0600  NA 135 136 137 141 143  K 4.3 4.1 3.9 3.3* 3.2*  CL 104 104 103 109 107  CO2 23 22 24 25 28   GLUCOSE 118* 112* 125* 125* 124*  BUN 26* 24* 24* 23 20  CREATININE 1.55* 1.52* 1.63* 1.40* 1.62*  CALCIUM 7.9* 7.6* 7.9* 7.7* 8.0*  MG  --   --   --   --  2.2  PHOS  --   --   --   --  3.3   GFR: Estimated Creatinine Clearance: 40 mL/min (A) (by C-G formula based on SCr of 1.62 mg/dL (H)). Liver Function Tests: Recent Labs  Lab 10/22/23 0100 10/23/23 0402  AST 21 17  ALT 21 17  ALKPHOS 78 62  BILITOT 0.6 0.8  PROT 6.6 6.3*  ALBUMIN 2.8* 2.4*   No results for input(s): "LIPASE", "AMYLASE" in the last 168 hours. No results for input(s): "AMMONIA" in the last 168 hours. Coagulation Profile: Recent Labs  Lab 10/22/23 0100  INR 1.2   Cardiac Enzymes: No results for input(s): "CKTOTAL", "CKMB",  "CKMBINDEX", "TROPONINI" in the last 168 hours. BNP (last 3 results) No results for input(s): "PROBNP" in the last 8760 hours. HbA1C: No results for input(s): "HGBA1C" in the last 72 hours. CBG: Recent Labs  Lab 10/24/23 1624 10/24/23 1953 10/25/23 0501 10/25/23 0812 10/25/23 1207  GLUCAP 88 106* 123* 140* 101*   Lipid Profile: No results for input(s): "CHOL", "HDL", "LDLCALC", "TRIG", "CHOLHDL", "LDLDIRECT" in the last 72 hours. Thyroid  Function Tests: No results for input(s): "TSH", "T4TOTAL", "FREET4", "T3FREE", "THYROIDAB" in the last 72 hours. Anemia Panel: No results for input(s): "VITAMINB12", "FOLATE", "FERRITIN", "TIBC", "IRON", "RETICCTPCT" in the last 72 hours. Sepsis Labs: Recent Labs  Lab 10/22/23 0100 10/22/23 0315  LATICACIDVEN 1.3 1.1    Recent Results (from the past 240 hours)  Culture, blood (x 2)     Status: None (Preliminary result)   Collection Time: 10/22/23  1:00 AM   Specimen: BLOOD  Result Value Ref Range Status   Specimen Description BLOOD BLOOD RIGHT ARM  Final   Special Requests   Final    BOTTLES DRAWN AEROBIC AND ANAEROBIC Blood Culture adequate volume   Culture   Final    NO GROWTH 3 DAYS Performed at [facility], [address], [place], [place] [address]    Report Status PENDING  Incomplete  Culture, blood (x 2)     Status: None (Preliminary result)   Collection Time: 10/22/23  1:00 AM   Specimen: BLOOD  Result Value Ref Range Status   Specimen Description BLOOD BLOOD RIGHT ARM  Final   Special Requests   Final    BOTTLES DRAWN AEROBIC AND ANAEROBIC Blood Culture adequate volume   Culture   Final    NO GROWTH 3 DAYS Performed at [facility], [address], [place], [place] [address]    Report Status PENDING  Incomplete         Radiology Studies: No results found.       Scheduled Meds:  Chlorhexidine  Gluconate Cloth  6 each Topical Q0600   feeding supplement (PROSource TF20)  60 mL Per Tube  Daily   free water   30 mL Per Tube Q4H   gentamicin ointment   Topical TID   insulin  aspart  0-9 Units Subcutaneous Q4H   pantoprazole  (PROTONIX ) IV  40 mg Intravenous Q24H   QUEtiapine   25 mg Per Tube Q breakfast   QUEtiapine   50 mg Per Tube QHS   Continuous Infusions:  feeding supplement (OSMOLITE 1.5 CAL) 40 mL/hr at 10/24/23 2300   piperacillin -tazobactam (ZOSYN )  IV 3.375 g (10/25/23 0529)     LOS: 20 days  Tiajuana Fluke, MD [facility]   If 7PM-7AM, please contact night-coverage  10/25/2023, 2:11 PM

## 2023-10-25 NOTE — Progress Notes (Signed)
 Occupational Therapy Treatment Patient Details Name: Robert Vega MRN: [id number] DOB: Apr 09, 1941 Today's Date: 10/25/2023   History of present illness 83 y.o male with significant PMH of OSA, GERD, Obesity, HTN, Dysphagia: EGD 03/2022 with food in upper esophagus complicated by aspiration event, cardiac arrest with round of CPR, and post resuscitation EGD with concern for lack of peristalsis. Pt presented to ED on 10/05/2023 with hypoxia, fever and generalized weakness; developed acute respiratory failure requiring intubation 5/10 due to aspiration pneumonia, extubated 5/15, but had significant agitation required brief course of Precedex ; pt now s/p exploratory laparotomy with closure of gastrotomy and insertion of gastrostomy tube on 10/22/23   OT comments  Chart reviewed to date, pt greeted in room, hand off from PT after amb in hallway. Pt is oriented to self only. He is restless throughout, attempting to pick at IV line/foley but able to be redirected. Tx session targeted improving functional activity tolerance in prep for ADL tasks. STS completed with MIN A via HHA, short amb transfer to bsc for BM then amb approx 10' in room with pt pushing IV pole with MIN A.  MAX A required for peri care following BM, MIN A for grooming, MIN A for UB bathing, MAX A for LB bathing. Step by step multi modal cues required throughout. Posey activity board left in room for patient. Pt is left in modified chair position, all needs met, safety maintained and sitter present. OT will continue to follow.       If plan is discharge home, recommend the following:  Assistance with cooking/housework;Help with stairs or ramp for entrance;Supervision due to cognitive status;A lot of help with walking and/or transfers;A lot of help with bathing/dressing/bathroom   Equipment Recommendations  Other (comment) (defer to next venue of care)    Recommendations for Other Services      Precautions / Restrictions  Precautions Precautions: Fall Recall of Precautions/Restrictions: Impaired Precaution/Restrictions Comments: abdominal binder Restrictions Weight Bearing Restrictions Per Provider Order: No       Mobility Bed Mobility Overal bed mobility: Needs Assistance Bed Mobility: Sit to Supine       Sit to supine: Mod assist, HOB elevated, Max assist   General bed mobility comments: step by step multi modal cues for technique    Transfers Overall transfer level: Needs assistance Equipment used: None Transfers: Sit to/from Stand Sit to Stand: Min assist                 Balance Overall balance assessment: Needs assistance Sitting-balance support: Feet supported Sitting balance-Leahy Scale: Good     Standing balance support: Bilateral upper extremity supported, During functional activity, Reliant on assistive device for balance Standing balance-Leahy Scale: Fair                             ADL either performed or assessed with clinical judgement   ADL Overall ADL's : Needs assistance/impaired     Grooming: Wash/dry face;Sitting;Minimal assistance;Cueing for sequencing;Cueing for compensatory techniques   Upper Body Bathing: Moderate assistance;Sitting   Lower Body Bathing: Maximal assistance;Sit to/from stand   Upper Body Dressing : Minimal assistance;Sitting   Lower Body Dressing: Maximal assistance;Bed level   Toilet Transfer: Minimal assistance;BSC/3in1;Cueing for sequencing;Cueing for safety Toilet Transfer Details (indicate cue type and reason): step pivot transfer to bedside commode and back to bed Toileting- Clothing Manipulation and Hygiene: Maximal assistance;Sit to/from stand       Functional mobility during ADLs: Minimal  assistance;Cueing for safety;Cueing for sequencing (approx 10' in room wit pt pushing IV pole)      Extremity/Trunk Assessment              Vision       Perception     Praxis     Communication  Communication Communication: Impaired Factors Affecting Communication: Reduced clarity of speech (garbled speech)   Cognition Arousal: Alert Behavior During Therapy: Impulsive, restless Cognition: Cognition impaired   Orientation impairments: Situation, Time, Place Awareness: Intellectual awareness impaired, Online awareness impaired Memory impairment (select all impairments): Short-term memory Attention impairment (select first level of impairment): Focused attention Executive functioning impairment (select all impairments): Sequencing, Problem solving, Reasoning                   Following commands: Impaired Following commands impaired: Follows one step commands inconsistently, Follows one step commands with increased time      Cueing   Cueing Techniques: Verbal cues, Gestural cues, Tactile cues  Exercises      Shoulder Instructions       General Comments vss throughout, abdominal binder in place pre/post session, sitter in room throughout, B mitts donned    Pertinent Vitals/ Pain       Pain Assessment Pain Assessment: PAINAD Breathing: normal Negative Vocalization: none Facial Expression: smiling or inexpressive Body Language: tense, distressed pacing, fidgeting Consolability: distracted or reassured by voice/touch PAINAD Score: 2 Pain Location: generalized Pain Intervention(s): Monitored during session  Home Living                                          Prior Functioning/Environment              Frequency  Min 3X/week        Progress Toward Goals  OT Goals(current goals can now be found in the care plan section)  Progress towards OT goals: Progressing toward goals  Acute Rehab OT Goals Time For Goal Achievement: 11/06/23  Plan      Co-evaluation                 AM-PAC OT "6 Clicks" Daily Activity     Outcome Measure   Help from another person eating meals?: Total Help from another person taking care of  personal grooming?: A Little Help from another person toileting, which includes using toliet, bedpan, or urinal?: A Lot Help from another person bathing (including washing, rinsing, drying)?: A Lot Help from another person to put on and taking off regular upper body clothing?: A Little Help from another person to put on and taking off regular lower body clothing?: A Lot 6 Click Score: 13    End of Session    OT Visit Diagnosis: Unsteadiness on feet (R26.81);Repeated falls (R29.6);Muscle weakness (generalized) (M62.81)   Activity Tolerance Patient tolerated treatment well   Patient Left in bed;with call bell/phone within reach;with bed alarm set;with nursing/sitter in room;Other (comment) (B mitts)   Nurse Communication Mobility status        Time: 1424-1450 OT Time Calculation (min): 26 min  Charges: OT General Charges $OT Visit: 1 Visit OT Treatments $Self Care/Home Management : 8-22 mins $Therapeutic Activity: 8-22 mins  Gerre Kraft, OTD OTR/L  10/25/23, 3:51 PM

## 2023-10-26 DIAGNOSIS — R6520 Severe sepsis without septic shock: Secondary | ICD-10-CM | POA: Diagnosis not present

## 2023-10-26 DIAGNOSIS — A419 Sepsis, unspecified organism: Secondary | ICD-10-CM | POA: Diagnosis not present

## 2023-10-26 LAB — BASIC METABOLIC PANEL WITH GFR
Anion gap: 8 (ref 5–15)
BUN: 19 mg/dL (ref 8–23)
CO2: 26 mmol/L (ref 22–32)
Calcium: 7.9 mg/dL — ABNORMAL LOW (ref 8.9–10.3)
Chloride: 112 mmol/L — ABNORMAL HIGH (ref 98–111)
Creatinine, Ser: 1.8 mg/dL — ABNORMAL HIGH (ref 0.61–1.24)
GFR, Estimated: 37 mL/min — ABNORMAL LOW (ref >60)
Glucose, Bld: 140 mg/dL — ABNORMAL HIGH (ref 70–99)
Potassium: 3.7 mmol/L (ref 3.5–5.1)
Sodium: 146 mmol/L — ABNORMAL HIGH (ref 135–145)

## 2023-10-26 LAB — PHOSPHORUS: Phosphorus: 4.1 mg/dL (ref 2.5–4.6)

## 2023-10-26 LAB — MAGNESIUM: Magnesium: 2.2 mg/dL (ref 1.7–2.4)

## 2023-10-26 LAB — GLUCOSE, CAPILLARY
Glucose-Capillary: 107 mg/dL — ABNORMAL HIGH (ref 70–99)
Glucose-Capillary: 116 mg/dL — ABNORMAL HIGH (ref 70–99)
Glucose-Capillary: 126 mg/dL — ABNORMAL HIGH (ref 70–99)
Glucose-Capillary: 135 mg/dL — ABNORMAL HIGH (ref 70–99)
Glucose-Capillary: 138 mg/dL — ABNORMAL HIGH (ref 70–99)

## 2023-10-26 MED ORDER — FREE WATER
60.0000 mL | Status: DC
  Administered 2023-10-26 – 2023-10-28 (×11): 60 mL

## 2023-10-26 MED ORDER — QUETIAPINE FUMARATE 25 MG PO TABS
25.0000 mg | ORAL_TABLET | Freq: Every day | ORAL | Status: DC
  Administered 2023-10-26 – 2023-10-29 (×4): 25 mg
  Filled 2023-10-26 (×4): qty 1

## 2023-10-26 NOTE — Plan of Care (Signed)
  Problem: Education: Goal: Knowledge of General Education information will improve Description: Including pain rating scale, medication(s)/side effects and non-pharmacologic comfort measures Outcome: Progressing   Problem: Health Behavior/Discharge Planning: Goal: Ability to manage health-related needs will improve Outcome: Progressing   Problem: Clinical Measurements: Goal: Ability to maintain clinical measurements within normal limits will improve Outcome: Progressing Goal: Will remain free from infection Outcome: Progressing Goal: Diagnostic test results will improve Outcome: Progressing Goal: Respiratory complications will improve Outcome: Progressing Goal: Cardiovascular complication will be avoided Outcome: Progressing   Problem: Activity: Goal: Risk for activity intolerance will decrease Outcome: Progressing   Problem: Nutrition: Goal: Adequate nutrition will be maintained Outcome: Progressing   Problem: Coping: Goal: Level of anxiety will decrease Outcome: Progressing   Problem: Elimination: Goal: Will not experience complications related to bowel motility Outcome: Progressing Goal: Will not experience complications related to urinary retention Outcome: Progressing   Problem: Pain Managment: Goal: General experience of comfort will improve and/or be controlled Outcome: Progressing   Problem: Safety: Goal: Ability to remain free from injury will improve Outcome: Progressing   Problem: Skin Integrity: Goal: Risk for impaired skin integrity will decrease Outcome: Progressing   Problem: Activity: Goal: Ability to tolerate increased activity will improve Outcome: Progressing   Problem: Clinical Measurements: Goal: Ability to maintain a body temperature in the normal range will improve Outcome: Progressing   Problem: Respiratory: Goal: Ability to maintain adequate ventilation will improve Outcome: Progressing Goal: Ability to maintain a clear airway  will improve Outcome: Progressing   Problem: Education: Goal: Ability to describe self-care measures that may prevent or decrease complications (Diabetes Survival Skills Education) will improve Outcome: Progressing Goal: Individualized Educational Video(s) Outcome: Progressing   Problem: Coping: Goal: Ability to adjust to condition or change in health will improve Outcome: Progressing   Problem: Fluid Volume: Goal: Ability to maintain a balanced intake and output will improve Outcome: Progressing   Problem: Health Behavior/Discharge Planning: Goal: Ability to identify and utilize available resources and services will improve Outcome: Progressing Goal: Ability to manage health-related needs will improve Outcome: Progressing   Problem: Metabolic: Goal: Ability to maintain appropriate glucose levels will improve Outcome: Progressing   Problem: Nutritional: Goal: Maintenance of adequate nutrition will improve Outcome: Progressing Goal: Progress toward achieving an optimal weight will improve Outcome: Progressing   Problem: Skin Integrity: Goal: Risk for impaired skin integrity will decrease Outcome: Progressing   Problem: Tissue Perfusion: Goal: Adequacy of tissue perfusion will improve Outcome: Progressing   Problem: Activity: Goal: Ability to tolerate increased activity will improve Outcome: Progressing   Problem: Respiratory: Goal: Ability to maintain a clear airway and adequate ventilation will improve Outcome: Progressing   Problem: Role Relationship: Goal: Method of communication will improve Outcome: Progressing   Problem: Fluid Volume: Goal: Hemodynamic stability will improve Outcome: Progressing   Problem: Clinical Measurements: Goal: Diagnostic test results will improve Outcome: Progressing Goal: Signs and symptoms of infection will decrease Outcome: Progressing   Problem: Respiratory: Goal: Ability to maintain adequate ventilation will  improve Outcome: Progressing

## 2023-10-26 NOTE — Progress Notes (Signed)
 PROGRESS NOTE    Robert Vega  ZOX:[id number] DOB: 1940-09-19 DOA: 10/05/2023 PCP: Helaine Llanos, MD    Brief Narrative:  83 y.o male with significant PMH of OSA, GERD, Obesity, HTN, Dysphagia: EGD 03/2022 with food in upper esophagus complicated by aspiration event, cardiac arrest with round of CPR, and post resuscitation EGD with concern for lack of peristalsis. Prior EGD 08/2020 with note of abnormal cricopharyngeus, decrease in motility in esophagus, and spastic LES who presented to the ED from with hypoxia, fever and generalized weakness.  Patient developed acute respiratory failure requiring intubation due to aspiration pneumonia, he was treated with broad-spectrum antibiotics and managed in ICU. Condition had improved, extubated 5/15, but has significant agitation required brief course of Precedex . 5/10: Admit to [facility] service with sepsis due to Aspiration Pneumonia.  Course complicated by Acute Respiratory Failure due to Aspiration of vomitus requiring s/p cardiac arrest  transfer to ICU and intubation.  5/11 flexible bronchoscopy done by critical care team 5/12 remains intubated 5/13 remains on vent, s/p PEG tube, +vomiting last night, failed SAT/SBT 5/14 extubated successfully but with severe delirium 5/20.  Botulinum toxin injection into the lower esophageal sphincter by Dr. Ole Berkeley   5/21 esophagram showing diffusely distended esophagus with distal obstruction and severe dysmotility suggestive of achalasia. 5/22.  Advised I would not give him any food and will continue PEG feeding he can try liquids to see if that goes down but still high risk for aspiration.  Patient had a fall on the evening of 5/22. 5/23.  Patient feels okay.  Awaken from sleep.  Did not offer any complaints.  Awaiting insurance authorization for rehab. 5/24.  Patient feeling okay.  States he is doing okay with the liquid diet.  Still feels weak. 5/25.  Patient pulled out PEG tube this morning.  Foley catheter  placed in the stoma.  GI able to place a another PEG tube and remove the Foley. 5/26.  Creatinine up at 1.83.  Continue IV fluid hydration.  Patient started having pain after tube feeding started.  CT scan showed that the PEG tube was not in the correct place.  Notified gastroenterology.  Started empiric antibiotics. 5/27.  Patient was taken urgently to the operating room by Dr. Cornel Diesel because the patient had fever and elevated white count with suspected intra-abdominal sepsis. 5/29: Agitation/sundowning noted around shift change 5/28.  Suspect hospital-acquired delirium.  Received doses of atypical antipsychotics.  Much more calm, awake, alert in AM.  Assessment & Plan:   Principal Problem:   Severe sepsis (HCC) Active Problems:   Gastrostomy complication (HCC)   Dysphagia   Achalasia of esophagus   AKI (acute kidney injury) (HCC)   Acute delirium   Acute respiratory failure with hypoxia and hypercapnia (HCC)   Essential hypertension, benign   GERD (gastroesophageal reflux disease)   Multifocal pneumonia   History of dysphagia   Aspiration pneumonia (HCC)   Obesity (BMI 30-39.9)   Generalized weakness   Generalized abdominal pain   Severe sepsis (HCC) Recurrence with different problem.  Intra-abdominal infection secondary to PEG tube not being in the right place.  Elevated white count and fever and acute kidney injury.  Patient brought emergently to the operating room on 5/27 by Dr. Cornel Diesel.   Plan: Continue tube feeds, escalate to goal N.p.o. for the foreseeable future Continue IV Zosyn  for now, can de-escalate to oral regimen if patient leaves the hospital prior to completion of a total 2-week course of antibiotic  Gastrostomy complication (  HCC) Patient self discontinued PEG tube and malpositioning likely resulted in some intra-abdominal sepsis.  Tube feeds were held.  Restarted 5/28.  Now at goal rate of 60   AKI (acute kidney injury) (HCC) Free water  flushes via NGT.   Escalated to 60 mL every 6 hours   Achalasia of esophagus Status post botulinum toxin injection on 5/20.   Esophagram showing dilated esophagus distal obstruction. Per GI esophagram continues to demonstrate severe dysmotility with dilated esophagus.  Consistent with end-stage esophageal disease.  Patient is n.p.o. indefinitely.  Should only be fed via PEG tube considering the risk of aspiration. Will need outpatient referral to tertiary advanced GI for consideration of POEMS procedure.  Recommend this only after a few weeks recovery period.  Discussed recommendations with patient's granddaughter via phone   Dysphagia Likely secondary to achalasia.  Patient had Botox  injection of the lower esophageal sphincter due to lower esophageal sphincter hypertension causing the patient not being able to swallow and empty the esophagus of his ingested food.  Food was in the esophagus on the previous EGD.  N.p.o.     Acute delirium Agitation/sundowning Continue as needed Haldol .  Discontinue Reglan  Continue Seroquel  50mg  per tube nightly Continue Seroquel  25mg  daily   Acute respiratory failure with hypoxia and hypercapnia (HCC) Pneumonia Now resolved   Essential hypertension, benign Hold Norvasc    GERD (gastroesophageal reflux disease) Continue PPI     Generalized weakness Therapy evaluations as tolerated Will benefit from placement to skilled nursing facility   Obesity (BMI 30-39.9) BMI 33.16 Complicating factor in overall care and prognosis    DVT prophylaxis: SCD Code Status: Full Family Communication: granddaughter [name] [phone] on 5/30.   Disposition Plan: Status is: Inpatient Remains inpatient appropriate because: Multiple acute issues as above   Level of care: Progressive  Consultants:  General surgery  Procedures:  Ex lap  Antimicrobials: Zosyn    Subjective: Seen and examined.  Appears calm this morning.  Less agitated last night.  Objective: Vitals:    10/25/23 2016 10/26/23 0041 10/26/23 0421 10/26/23 0739  BP: (!) 154/102 (!) 141/76  115/65  Pulse: 84 83  67  Resp: 20 18  18   Temp:  98.3 F (36.8 C)  98 F (36.7 C)  TempSrc:    Oral  SpO2: 93% 90%    Weight:   100.7 kg   Height:        Intake/Output Summary (Last 24 hours) at 10/26/2023 1110 Last data filed at 10/26/2023 0745 Gross per 24 hour  Intake 590 ml  Output 2200 ml  Net -1610 ml   Filed Weights   10/23/23 0500 10/24/23 0500 10/26/23 0421  Weight: 103.7 kg 102.5 kg 100.7 kg    Examination:  General exam: NAD.  Appears fatigued Respiratory system: Bibasalar crackles.  Normal work of breathing.  Room air Cardiovascular system: S1-S2, RRR, no murmurs, no pedal edema Gastrointestinal system: Soft, NT ND, + PEG Central nervous system: Alert and oriented x 2. No focal neurological deficits. Extremities: Symmetrically decreased power bilaterally Skin: No rashes, lesions or ulcers Psychiatry: Judgement and insight appear impaired. Mood & affect confused.     Data Reviewed: I have personally reviewed following labs and imaging studies  CBC: Recent Labs  Lab 10/21/23 0714 10/22/23 0100 10/22/23 0315 10/23/23 0402 10/24/23 0519  WBC 7.4 18.7* 19.7* 12.6* 8.1  NEUTROABS 5.1 16.4* 17.2*  --   --   HGB 11.1* 11.3* 11.0* 9.9* 8.8*  HCT 33.6* 32.9* 32.1* 29.1* 25.6*  MCV 96.3  94.5 94.1 95.1 94.1  PLT 300 300 286 273 267   Basic Metabolic Panel: Recent Labs  Lab 10/22/23 0315 10/23/23 0402 10/24/23 0519 10/25/23 0600 10/26/23 0449  NA 136 137 141 143 146*  K 4.1 3.9 3.3* 3.2* 3.7  CL 104 103 109 107 112*  CO2 22 24 25 28 26   GLUCOSE 112* 125* 125* 124* 140*  BUN 24* 24* 23 20 19   CREATININE 1.52* 1.63* 1.40* 1.62* 1.80*  CALCIUM 7.6* 7.9* 7.7* 8.0* 7.9*  MG  --   --   --  2.2 2.2  PHOS  --   --   --  3.3 4.1   GFR: Estimated Creatinine Clearance: 35.7 mL/min (A) (by C-G formula based on SCr of 1.8 mg/dL (H)). Liver Function Tests: Recent Labs   Lab 10/22/23 0100 10/23/23 0402  AST 21 17  ALT 21 17  ALKPHOS 78 62  BILITOT 0.6 0.8  PROT 6.6 6.3*  ALBUMIN 2.8* 2.4*   No results for input(s): "LIPASE", "AMYLASE" in the last 168 hours. No results for input(s): "AMMONIA" in the last 168 hours. Coagulation Profile: Recent Labs  Lab 10/22/23 0100  INR 1.2   Cardiac Enzymes: No results for input(s): "CKTOTAL", "CKMB", "CKMBINDEX", "TROPONINI" in the last 168 hours. BNP (last 3 results) No results for input(s): "PROBNP" in the last 8760 hours. HbA1C: No results for input(s): "HGBA1C" in the last 72 hours. CBG: Recent Labs  Lab 10/25/23 1637 10/25/23 2131 10/26/23 0006 10/26/23 0413 10/26/23 0736  GLUCAP 122* 100* 126* 135* 116*   Lipid Profile: No results for input(s): "CHOL", "HDL", "LDLCALC", "TRIG", "CHOLHDL", "LDLDIRECT" in the last 72 hours. Thyroid  Function Tests: No results for input(s): "TSH", "T4TOTAL", "FREET4", "T3FREE", "THYROIDAB" in the last 72 hours. Anemia Panel: No results for input(s): "VITAMINB12", "FOLATE", "FERRITIN", "TIBC", "IRON", "RETICCTPCT" in the last 72 hours. Sepsis Labs: Recent Labs  Lab 10/22/23 0100 10/22/23 0315  LATICACIDVEN 1.3 1.1    Recent Results (from the past 240 hours)  Culture, blood (x 2)     Status: None (Preliminary result)   Collection Time: 10/22/23  1:00 AM   Specimen: BLOOD  Result Value Ref Range Status   Specimen Description BLOOD BLOOD RIGHT ARM  Final   Special Requests   Final    BOTTLES DRAWN AEROBIC AND ANAEROBIC Blood Culture adequate volume   Culture   Final    NO GROWTH 3 DAYS Performed at [facility], [address] [place], [place] [address]    Report Status PENDING  Incomplete  Culture, blood (x 2)     Status: None (Preliminary result)   Collection Time: 10/22/23  1:00 AM   Specimen: BLOOD  Result Value Ref Range Status   Specimen Description BLOOD BLOOD RIGHT ARM  Final   Special Requests   Final    BOTTLES DRAWN AEROBIC  AND ANAEROBIC Blood Culture adequate volume   Culture   Final    NO GROWTH 3 DAYS Performed at [facility], [address] [place], [place] [address]    Report Status PENDING  Incomplete         Radiology Studies: No results found.       Scheduled Meds:  Chlorhexidine  Gluconate Cloth  6 each Topical Q0600   feeding supplement (PROSource TF20)  60 mL Per Tube Daily   free water   60 mL Per Tube Q4H   gentamicin  ointment   Topical TID   insulin  aspart  0-9 Units Subcutaneous Q4H  pantoprazole  (PROTONIX ) IV  40 mg Intravenous Q24H   QUEtiapine   25 mg Per Tube Q breakfast   QUEtiapine   50 mg Per Tube QHS   Continuous Infusions:  feeding supplement (OSMOLITE 1.5 CAL) 1,000 mL (10/26/23 2536)   piperacillin -tazobactam (ZOSYN )  IV 3.375 g (10/26/23 0557)     LOS: 21 days      Tiajuana Fluke, MD [facility]   If 7PM-7AM, please contact night-coverage  10/26/2023, 11:10 AM

## 2023-10-26 NOTE — Progress Notes (Signed)
 Physical Therapy Treatment Patient Details Name: Robert Vega MRN: [id number] DOB: [birth year] Today's Date: 10/26/2023   History of Present Illness 83 y.o male with significant PMH of OSA, GERD, Obesity, HTN, Dysphagia: EGD 03/2022 with food in upper esophagus complicated by aspiration event, cardiac arrest with round of CPR, and post resuscitation EGD with concern for lack of peristalsis. Pt presented to ED on 10/05/2023 with hypoxia, fever and generalized weakness; developed acute respiratory failure requiring intubation 5/10 due to aspiration pneumonia, extubated 5/15, but had significant agitation required brief course of Precedex ; pt now s/p exploratory laparotomy with closure of gastrotomy and insertion of gastrostomy tube on 10/22/23    PT Comments  Pt was supine in bed with HOB elevated ~ 30 degrees. Safety sitter remains present. Pt overall much more lethargic today. Author contributes lethargy to medications. MD made aware of concerns and medication management adjusted. He was able to follow simple commands somewhat inconsistently. Still truly only oriented x 2. He had incontinence of urine episode upon standing. Author elected not to ambulate with pt away form EOB due to lethargy. Acute PT will continue to follow and progress per current POC.    If plan is discharge home, recommend the following: A lot of help with walking and/or transfers;A lot of help with bathing/dressing/bathroom;Help with stairs or ramp for entrance;Assist for transportation;Direct supervision/assist for financial management;Assistance with cooking/housework;Direct supervision/assist for medications management     Equipment Recommendations  Other (comment) (Defer to next level of care)       Precautions / Restrictions Precautions Precautions: Fall Recall of Precautions/Restrictions: Impaired Precaution/Restrictions Comments: abdominal binder/ BUE Mitts does attempt to pull at line at times. reapplied mitts post  session Restrictions Weight Bearing Restrictions Per Provider Order: No     Mobility  Bed Mobility Overal bed mobility: Needs Assistance Bed Mobility: Supine to Sit, Sit to Supine  Supine to sit: Min assist, Mod assist, +2 for physical assistance, HOB elevated  General bed mobility comments: Pt required more assistance to safely progress form supine (HOB elevated ~ 20degrees) to sitting EOB. pt much more lethargic and struggles to keep eyes open    Transfers Overall transfer level: Needs assistance Equipment used: Rolling walker (2 wheels) Transfers: Sit to/from Stand Sit to Stand: Min assist, Mod assist, +2 safety/equipment, From elevated surface  General transfer comment: pt required more assistance today which author contributes to increased lethargy. pt had incontinence of urine upon standing due to purewicj malfunction. Dues to increased lethargy, elected not to ambulate away from EOB however pt did stand EOB 3 x and perform marching in place and sidesteps along EOB. safety sitter assisted during session for +2 assist for additional safety    Ambulation/Gait  General Gait Details: Pt able to march in place and take steps along EOB however due to lethargy, author elected not to ambulate pt away from EOB today     Balance Overall balance assessment: Needs assistance Sitting-balance support: Feet supported Sitting balance-Leahy Scale: Fair Sitting balance - Comments: even sitting balance less stable today versus previous date   Standing balance support: Bilateral upper extremity supported, During functional activity, Reliant on assistive device for balance Standing balance-Leahy Scale: Poor Standing balance comment: much worse standing balance even with BUE support today    Communication Communication Communication: Impaired Factors Affecting Communication: Reduced clarity of speech  Cognition Arousal: Lethargic, Suspect due to medications Behavior During Therapy: Impulsive    PT - Cognitive impairments: History of cognitive impairments   Orientation impairments: Time,  Situation    PT - Cognition Comments: patient confused, requires multimodal cues for safety, attending to task. Much more lethargic yesterday and today versus 1 week prior. MD/RN made aware of concerns for over medicated concerns. MD adjusted medications appropriately Following commands: Impaired Following commands impaired: Follows one step commands inconsistently    Cueing Cueing Techniques: Verbal cues, Tactile cues         Pertinent Vitals/Pain Pain Assessment Pain Assessment: No/denies pain Faces Pain Scale: No hurt     PT Goals (current goals can now be found in the care plan section) Acute Rehab PT Goals Patient Stated Goal: none stated Progress towards PT goals: Not progressing toward goals - comment (increased lethargy today)    Frequency    Min 3X/week           Co-evaluation     PT goals addressed during session: Mobility/safety with mobility        AM-PAC PT "6 Clicks" Mobility   Outcome Measure  Help needed turning from your back to your side while in a flat bed without using bedrails?: A Lot Help needed moving from lying on your back to sitting on the side of a flat bed without using bedrails?: A Lot Help needed moving to and from a bed to a chair (including a wheelchair)?: A Lot Help needed standing up from a chair using your arms (e.g., wheelchair or bedside chair)?: A Little Help needed to walk in hospital room?: A Lot Help needed climbing 3-5 steps with a railing? : A Lot 6 Click Score: 13    End of Session Equipment Utilized During Treatment: Gait belt Activity Tolerance: Patient limited by lethargy Patient left: in bed;with call bell/phone within reach;with bed alarm set;with nursing/sitter in room Nurse Communication: Mobility status PT Visit Diagnosis: Unsteadiness on feet (R26.81);Repeated falls (R29.6);Muscle weakness (generalized)  (M62.81);Difficulty in walking, not elsewhere classified (R26.2);Other abnormalities of gait and mobility (R26.89);Pain     Time: 1040-1056 PT Time Calculation (min) (ACUTE ONLY): 16 min  Charges:    $Therapeutic Activity: 8-22 mins PT General Charges $$ ACUTE PT VISIT: 1 Visit                     Chester Costa PTA 10/26/23, 1:02 PM

## 2023-10-27 DIAGNOSIS — A419 Sepsis, unspecified organism: Secondary | ICD-10-CM | POA: Diagnosis not present

## 2023-10-27 DIAGNOSIS — R6520 Severe sepsis without septic shock: Secondary | ICD-10-CM | POA: Diagnosis not present

## 2023-10-27 LAB — BASIC METABOLIC PANEL WITH GFR
Anion gap: 8 (ref 5–15)
BUN: 22 mg/dL (ref 8–23)
CO2: 27 mmol/L (ref 22–32)
Calcium: 7.7 mg/dL — ABNORMAL LOW (ref 8.9–10.3)
Chloride: 112 mmol/L — ABNORMAL HIGH (ref 98–111)
Creatinine, Ser: 2.01 mg/dL — ABNORMAL HIGH (ref 0.61–1.24)
GFR, Estimated: 32 mL/min — ABNORMAL LOW (ref >60)
Glucose, Bld: 131 mg/dL — ABNORMAL HIGH (ref 70–99)
Potassium: 3.6 mmol/L (ref 3.5–5.1)
Sodium: 147 mmol/L — ABNORMAL HIGH (ref 135–145)

## 2023-10-27 LAB — CULTURE, BLOOD (ROUTINE X 2)
Culture: NO GROWTH
Culture: NO GROWTH
Special Requests: ADEQUATE
Special Requests: ADEQUATE

## 2023-10-27 LAB — GLUCOSE, CAPILLARY
Glucose-Capillary: 108 mg/dL — ABNORMAL HIGH (ref 70–99)
Glucose-Capillary: 117 mg/dL — ABNORMAL HIGH (ref 70–99)
Glucose-Capillary: 122 mg/dL — ABNORMAL HIGH (ref 70–99)
Glucose-Capillary: 123 mg/dL — ABNORMAL HIGH (ref 70–99)
Glucose-Capillary: 123 mg/dL — ABNORMAL HIGH (ref 70–99)
Glucose-Capillary: 137 mg/dL — ABNORMAL HIGH (ref 70–99)
Glucose-Capillary: 143 mg/dL — ABNORMAL HIGH (ref 70–99)

## 2023-10-27 LAB — MAGNESIUM: Magnesium: 2.3 mg/dL (ref 1.7–2.4)

## 2023-10-27 LAB — PHOSPHORUS: Phosphorus: 4 mg/dL (ref 2.5–4.6)

## 2023-10-27 MED ORDER — DEXTROSE 5 % IV SOLN: INTRAVENOUS | Status: DC

## 2023-10-27 NOTE — Progress Notes (Signed)
 PROGRESS NOTE    Robert Vega  WUJ:[id number] DOB: 05/05/1941 DOA: 10/05/2023 PCP: Helaine Llanos, MD    Brief Narrative:  83 y.o male with significant PMH of OSA, GERD, Obesity, HTN, Dysphagia: EGD 03/2022 with food in upper esophagus complicated by aspiration event, cardiac arrest with round of CPR, and post resuscitation EGD with concern for lack of peristalsis. Prior EGD 08/2020 with note of abnormal cricopharyngeus, decrease in motility in esophagus, and spastic LES who presented to the ED from with hypoxia, fever and generalized weakness.  Patient developed acute respiratory failure requiring intubation due to aspiration pneumonia, he was treated with broad-spectrum antibiotics and managed in ICU. Condition had improved, extubated 5/15, but has significant agitation required brief course of Precedex . 5/10: Admit to [facility] service with sepsis due to Aspiration Pneumonia.  Course complicated by Acute Respiratory Failure due to Aspiration of vomitus requiring s/p cardiac arrest  transfer to ICU and intubation.  5/11 flexible bronchoscopy done by critical care team 5/12 remains intubated 5/13 remains on vent, s/p PEG tube, +vomiting last night, failed SAT/SBT 5/14 extubated successfully but with severe delirium 5/20.  Botulinum toxin injection into the lower esophageal sphincter by Dr. Ole Berkeley   5/21 esophagram showing diffusely distended esophagus with distal obstruction and severe dysmotility suggestive of achalasia. 5/22.  Advised I would not give him any food and will continue PEG feeding he can try liquids to see if that goes down but still high risk for aspiration.  Patient had a fall on the evening of 5/22. 5/23.  Patient feels okay.  Awaken from sleep.  Did not offer any complaints.  Awaiting insurance authorization for rehab. 5/24.  Patient feeling okay.  States he is doing okay with the liquid diet.  Still feels weak. 5/25.  Patient pulled out PEG tube this morning.  Foley catheter  placed in the stoma.  GI able to place a another PEG tube and remove the Foley. 5/26.  Creatinine up at 1.83.  Continue IV fluid hydration.  Patient started having pain after tube feeding started.  CT scan showed that the PEG tube was not in the correct place.  Notified gastroenterology.  Started empiric antibiotics. 5/27.  Patient was taken urgently to the operating room by Dr. Cornel Diesel because the patient had fever and elevated white count with suspected intra-abdominal sepsis. 5/29: Agitation/sundowning noted around shift change 5/28.  Suspect hospital-acquired delirium.  Received doses of atypical antipsychotics.  Much more calm, awake, alert in AM. 6/1: Foley discontinued yesterday.  Has some AKI.  Has been urine.  Mental status improved.  Assessment & Plan:   Principal Problem:   Severe sepsis (HCC) Active Problems:   Gastrostomy complication (HCC)   Dysphagia   Achalasia of esophagus   AKI (acute kidney injury) (HCC)   Acute delirium   Acute respiratory failure with hypoxia and hypercapnia (HCC)   Essential hypertension, benign   GERD (gastroesophageal reflux disease)   Multifocal pneumonia   History of dysphagia   Aspiration pneumonia (HCC)   Obesity (BMI 30-39.9)   Generalized weakness   Generalized abdominal pain   Severe sepsis (HCC) Recurrence with different problem.  Intra-abdominal infection secondary to PEG tube not being in the right place.  Elevated white count and fever and acute kidney injury.  Patient brought emergently to the operating room on 5/27 by Dr. Cornel Diesel.   Plan: Continue tube feeds, escalate to goal N.p.o. for the foreseeable future Continue IV Zosyn  for now, can de-escalate to oral regimen if patient  leaves the hospital prior to completion of a total 2-week course of antibiotic  Gastrostomy complication (HCC) Patient self discontinued PEG tube and malpositioning likely resulted in some intra-abdominal sepsis.  Tube feeds were held.  Restarted 5/28.   Continue at goal rate   AKI (acute kidney injury) (HCC) D5 water  at 75 cc/h times next 24 hours.  Continue 60 mL every 4 hours free water  flushes via NGT.  Repeat renal parameters in AM.   Achalasia of esophagus Status post botulinum toxin injection on 5/20.   Esophagram showing dilated esophagus distal obstruction. Per GI esophagram continues to demonstrate severe dysmotility with dilated esophagus.  Consistent with end-stage esophageal disease.  Patient is n.p.o. indefinitely.  Should only be fed via PEG tube considering the risk of aspiration. Will need outpatient referral to tertiary advanced GI for consideration of POEMS procedure.  Recommend this only after a few weeks recovery period.  Discussed recommendations with patient's granddaughter via phone   Dysphagia Likely secondary to achalasia.  Patient had Botox  injection of the lower esophageal sphincter due to lower esophageal sphincter hypertension causing the patient not being able to swallow and empty the esophagus of his ingested food.  Food was in the esophagus on the previous EGD.  N.p.o.     Acute delirium Agitation/sundowning Continue as needed Haldol .  Discontinue Reglan  Continue Seroquel  25 mg per tube nightly Continue Seroquel  25mg  daily   Acute respiratory failure with hypoxia and hypercapnia (HCC) Pneumonia Now resolved   Essential hypertension, benign Hold Norvasc    GERD (gastroesophageal reflux disease) Continue PPI     Generalized weakness Therapy evaluations as tolerated Will benefit from placement to skilled nursing facility   Obesity (BMI 30-39.9) BMI 33.16 Complicating factor in overall care and prognosis    DVT prophylaxis: SCD Code Status: Full Family Communication: granddaughter [name] [phone] on 5/30.   Disposition Plan: Status is: Inpatient Remains inpatient appropriate because: Multiple acute issues as above   Level of care: Progressive  Consultants:  General  surgery  Procedures:  Ex lap  Antimicrobials: Zosyn    Subjective: Seen and examined.  More calm and alert this morning.  Objective: Vitals:   10/27/23 0102 10/27/23 0437 10/27/23 0500 10/27/23 0840  BP: (!) 118/95 114/85  (!) 135/57  Pulse: 81 73  86  Resp: 18 19  16   Temp:  98 F (36.7 C)  97.9 F (36.6 C)  TempSrc:  Oral    SpO2: 95% 100%  100%  Weight:   98.4 kg   Height:        Intake/Output Summary (Last 24 hours) at 10/27/2023 1219 Last data filed at 10/27/2023 1139 Gross per 24 hour  Intake --  Output 1550 ml  Net -1550 ml   Filed Weights   10/24/23 0500 10/26/23 0421 10/27/23 0500  Weight: 102.5 kg 100.7 kg 98.4 kg    Examination:  General exam: No acute distress.  Appears fatigued Respiratory system: Crackles at the bases.  Normal work of breathing.  Room air Cardiovascular system: S1-S2, RRR, no murmurs, no pedal edema Gastrointestinal system: Soft, NT ND, + PEG Central nervous system: Alert and oriented x 2. No focal neurological deficits. Extremities: Symmetrically decreased power bilaterally Skin: No rashes, lesions or ulcers Psychiatry: Judgement and insight appear impaired. Mood & affect confused.     Data Reviewed: I have personally reviewed following labs and imaging studies  CBC: Recent Labs  Lab 10/21/23 0714 10/22/23 0100 10/22/23 0315 10/23/23 0402 10/24/23 0519  WBC 7.4 18.7* 19.7* 12.6*  8.1  NEUTROABS 5.1 16.4* 17.2*  --   --   HGB 11.1* 11.3* 11.0* 9.9* 8.8*  HCT 33.6* 32.9* 32.1* 29.1* 25.6*  MCV 96.3 94.5 94.1 95.1 94.1  PLT 300 300 286 273 267   Basic Metabolic Panel: Recent Labs  Lab 10/23/23 0402 10/24/23 0519 10/25/23 0600 10/26/23 0449 10/27/23 0452  NA 137 141 143 146* 147*  K 3.9 3.3* 3.2* 3.7 3.6  CL 103 109 107 112* 112*  CO2 24 25 28 26 27   GLUCOSE 125* 125* 124* 140* 131*  BUN 24* 23 20 19 22   CREATININE 1.63* 1.40* 1.62* 1.80* 2.01*  CALCIUM 7.9* 7.7* 8.0* 7.9* 7.7*  MG  --   --  2.2 2.2 2.3  PHOS   --   --  3.3 4.1 4.0   GFR: Estimated Creatinine Clearance: 31.7 mL/min (A) (by C-G formula based on SCr of 2.01 mg/dL (H)). Liver Function Tests: Recent Labs  Lab 10/22/23 0100 10/23/23 0402  AST 21 17  ALT 21 17  ALKPHOS 78 62  BILITOT 0.6 0.8  PROT 6.6 6.3*  ALBUMIN 2.8* 2.4*   No results for input(s): "LIPASE", "AMYLASE" in the last 168 hours. No results for input(s): "AMMONIA" in the last 168 hours. Coagulation Profile: Recent Labs  Lab 10/22/23 0100  INR 1.2   Cardiac Enzymes: No results for input(s): "CKTOTAL", "CKMB", "CKMBINDEX", "TROPONINI" in the last 168 hours. BNP (last 3 results) No results for input(s): "PROBNP" in the last 8760 hours. HbA1C: No results for input(s): "HGBA1C" in the last 72 hours. CBG: Recent Labs  Lab 10/26/23 2010 10/27/23 0033 10/27/23 0513 10/27/23 0808 10/27/23 1131  GLUCAP 138* 137* 122* 143* 123*   Lipid Profile: No results for input(s): "CHOL", "HDL", "LDLCALC", "TRIG", "CHOLHDL", "LDLDIRECT" in the last 72 hours. Thyroid  Function Tests: No results for input(s): "TSH", "T4TOTAL", "FREET4", "T3FREE", "THYROIDAB" in the last 72 hours. Anemia Panel: No results for input(s): "VITAMINB12", "FOLATE", "FERRITIN", "TIBC", "IRON", "RETICCTPCT" in the last 72 hours. Sepsis Labs: Recent Labs  Lab 10/22/23 0100 10/22/23 0315  LATICACIDVEN 1.3 1.1    Recent Results (from the past 240 hours)  Culture, blood (x 2)     Status: None   Collection Time: 10/22/23  1:00 AM   Specimen: BLOOD  Result Value Ref Range Status   Specimen Description BLOOD BLOOD RIGHT ARM  Final   Special Requests   Final    BOTTLES DRAWN AEROBIC AND ANAEROBIC Blood Culture adequate volume   Culture   Final    NO GROWTH 5 DAYS Performed at [facility], [address], [place], [place] [address]    Report Status 10/27/2023 FINAL  Final  Culture, blood (x 2)     Status: None   Collection Time: 10/22/23  1:00 AM   Specimen: BLOOD  Result Value  Ref Range Status   Specimen Description BLOOD BLOOD RIGHT ARM  Final   Special Requests   Final    BOTTLES DRAWN AEROBIC AND ANAEROBIC Blood Culture adequate volume   Culture   Final    NO GROWTH 5 DAYS Performed at [facility], [address], [place], [place] [address]    Report Status 10/27/2023 FINAL  Final         Radiology Studies: No results found.       Scheduled Meds:  Chlorhexidine  Gluconate Cloth  6 each Topical Q0600   feeding supplement (PROSource TF20)  60 mL Per Tube Daily   free water   60  mL Per Tube Q4H   gentamicin  ointment   Topical TID   insulin  aspart  0-9 Units Subcutaneous Q4H   pantoprazole  (PROTONIX ) IV  40 mg Intravenous Q24H   QUEtiapine   25 mg Per Tube Q breakfast   QUEtiapine   25 mg Per Tube QHS   Continuous Infusions:  dextrose  40 mL/hr at 10/27/23 0811   feeding supplement (OSMOLITE 1.5 CAL) 1,000 mL (10/27/23 0639)     LOS: 22 days      Tiajuana Fluke, MD [facility]   If 7PM-7AM, please contact night-coverage  10/27/2023, 12:19 PM

## 2023-10-27 NOTE — Anesthesia Postprocedure Evaluation (Signed)
 Anesthesia Post Note  Patient: Robert Vega  Procedure(s) Performed: LAPAROTOMY, EXPLORATORY (Abdomen) CREATION, GASTROSTOMY, OPEN; GASTROSTOMY CLOSURE (Abdomen)  Patient location during evaluation: PACU Anesthesia Type: General Level of consciousness: awake and alert Pain management: pain level controlled Vital Signs Assessment: post-procedure vital signs reviewed and stable Respiratory status: spontaneous breathing, nonlabored ventilation, respiratory function stable and patient connected to nasal cannula oxygen Cardiovascular status: blood pressure returned to baseline and stable Postop Assessment: no apparent nausea or vomiting Anesthetic complications: no   No notable events documented.   Last Vitals:  Vitals:   10/27/23 0102 10/27/23 0437  BP: (!) 118/95 114/85  Pulse: 81 73  Resp: 18 19  Temp:  36.7 C  SpO2: 95% 100%    Last Pain:  Vitals:   10/27/23 0437  TempSrc: Oral  PainSc:                  Vanice Genre

## 2023-10-27 NOTE — TOC Progression Note (Signed)
 Transition of Care [facility]) - Progression Note    Patient Details  Name: Robert Vega MRN: [id number] Date of Birth: Feb 21, 1941  Transition of Care [facility]) CM/SW Contact  Loman Risk, RN Phone Number: 10/27/2023, 12:21 PM  Clinical Narrative:      Per MD patient not medically ready for dc - call placed to Granddaughter [name] to update  - Provided bed offer of [facility], which is currently the only bed offer - Bed search extended - [name] to also come up with a list of her top 3 choices of SNF for referral to be sent    Expected Discharge Plan and Services                                               Social Determinants of Health (SDOH) Interventions SDOH Screenings   Food Insecurity: Patient Unable To Answer (10/06/2023)  Recent Concern: Food Insecurity - Food Insecurity Present (09/11/2023)   Received from [facility]  Housing: Patient Unable To Answer (10/06/2023)  Recent Concern: Housing - High Risk (09/11/2023)   Received from [facility]  Transportation Needs: Patient Unable To Answer (10/06/2023)  Utilities: Patient Unable To Answer (10/06/2023)  Depression (PHQ2-9): Low Risk  (05/07/2023)  Financial Resource Strain: Medium Risk (09/11/2023)   Received from [facility]  Social Connections: Unknown (10/06/2023)  Tobacco Use: Medium Risk (10/22/2023)    Readmission Risk Interventions     No data to display

## 2023-10-28 ENCOUNTER — Inpatient Hospital Stay

## 2023-10-28 DIAGNOSIS — R918 Other nonspecific abnormal finding of lung field: Secondary | ICD-10-CM | POA: Diagnosis not present

## 2023-10-28 DIAGNOSIS — R6520 Severe sepsis without septic shock: Secondary | ICD-10-CM | POA: Diagnosis not present

## 2023-10-28 DIAGNOSIS — R0602 Shortness of breath: Secondary | ICD-10-CM | POA: Diagnosis not present

## 2023-10-28 DIAGNOSIS — A419 Sepsis, unspecified organism: Secondary | ICD-10-CM | POA: Diagnosis not present

## 2023-10-28 LAB — CBC WITH DIFFERENTIAL/PLATELET
Abs Immature Granulocytes: 0.16 10*3/uL — ABNORMAL HIGH (ref 0.00–0.07)
Basophils Absolute: 0 10*3/uL (ref 0.0–0.1)
Basophils Relative: 1 %
Eosinophils Absolute: 0.3 10*3/uL (ref 0.0–0.5)
Eosinophils Relative: 4 %
HCT: 30 % — ABNORMAL LOW (ref 39.0–52.0)
Hemoglobin: 9.9 g/dL — ABNORMAL LOW (ref 13.0–17.0)
Immature Granulocytes: 2 %
Lymphocytes Relative: 16 %
Lymphs Abs: 1.2 10*3/uL (ref 0.7–4.0)
MCH: 31.6 pg (ref 26.0–34.0)
MCHC: 33 g/dL (ref 30.0–36.0)
MCV: 95.8 fL (ref 80.0–100.0)
Monocytes Absolute: 0.8 10*3/uL (ref 0.1–1.0)
Monocytes Relative: 11 %
Neutro Abs: 5.1 10*3/uL (ref 1.7–7.7)
Neutrophils Relative %: 66 %
Platelets: 314 10*3/uL (ref 150–400)
RBC: 3.13 MIL/uL — ABNORMAL LOW (ref 4.22–5.81)
RDW: 13.2 % (ref 11.5–15.5)
WBC: 7.7 10*3/uL (ref 4.0–10.5)
nRBC: 0 % (ref 0.0–0.2)

## 2023-10-28 LAB — GLUCOSE, CAPILLARY
Glucose-Capillary: 124 mg/dL — ABNORMAL HIGH (ref 70–99)
Glucose-Capillary: 134 mg/dL — ABNORMAL HIGH (ref 70–99)
Glucose-Capillary: 127 mg/dL — ABNORMAL HIGH (ref 70–99)
Glucose-Capillary: 120 mg/dL — ABNORMAL HIGH (ref 70–99)
Glucose-Capillary: 107 mg/dL — ABNORMAL HIGH (ref 70–99)
Glucose-Capillary: 120 mg/dL — ABNORMAL HIGH (ref 70–99)

## 2023-10-28 LAB — BASIC METABOLIC PANEL WITH GFR
Anion gap: 10 (ref 5–15)
BUN: 26 mg/dL — ABNORMAL HIGH (ref 8–23)
CO2: 27 mmol/L (ref 22–32)
Calcium: 7.9 mg/dL — ABNORMAL LOW (ref 8.9–10.3)
Chloride: 109 mmol/L (ref 98–111)
Creatinine, Ser: 1.83 mg/dL — ABNORMAL HIGH (ref 0.61–1.24)
GFR, Estimated: 36 mL/min — ABNORMAL LOW (ref >60)
Glucose, Bld: 116 mg/dL — ABNORMAL HIGH (ref 70–99)
Potassium: 3.8 mmol/L (ref 3.5–5.1)
Sodium: 146 mmol/L — ABNORMAL HIGH (ref 135–145)

## 2023-10-28 MED ORDER — ORAL CARE MOUTH RINSE: 15.0000 mL | OROMUCOSAL | Status: DC | PRN

## 2023-10-28 MED ORDER — MORPHINE SULFATE (PF) 2 MG/ML IV SOLN
2.0000 mg | Freq: Once | INTRAVENOUS | Status: AC
  Administered 2023-10-28: 2 mg via INTRAVENOUS
  Filled 2023-10-28: qty 1

## 2023-10-28 MED ORDER — ORAL CARE MOUTH RINSE
15.0000 mL | OROMUCOSAL | Status: DC
  Administered 2023-10-28 – 2023-11-11 (×53): 15 mL via OROMUCOSAL

## 2023-10-28 MED ORDER — FREE WATER
100.0000 mL | Status: DC
  Administered 2023-10-28 – 2023-10-30 (×14): 100 mL

## 2023-10-28 MED ORDER — FUROSEMIDE 10 MG/ML IJ SOLN
40.0000 mg | Freq: Once | INTRAMUSCULAR | Status: AC
  Administered 2023-10-28: 40 mg via INTRAVENOUS
  Filled 2023-10-28: qty 4

## 2023-10-28 NOTE — Progress Notes (Signed)
 PROGRESS NOTE    Robert Vega  XBJ:[id number] DOB: 1940-09-26 DOA: 10/05/2023 PCP: Helaine Llanos, MD    Brief Narrative:  83 y.o male with significant PMH of OSA, GERD, Obesity, HTN, Dysphagia: EGD 03/2022 with food in upper esophagus complicated by aspiration event, cardiac arrest with round of CPR, and post resuscitation EGD with concern for lack of peristalsis. Prior EGD 08/2020 with note of abnormal cricopharyngeus, decrease in motility in esophagus, and spastic LES who presented to the ED from with hypoxia, fever and generalized weakness.  Patient developed acute respiratory failure requiring intubation due to aspiration pneumonia, he was treated with broad-spectrum antibiotics and managed in ICU. Condition had improved, extubated 5/15, but has significant agitation required brief course of Precedex . 5/10: Admit to [facility] service with sepsis due to Aspiration Pneumonia.  Course complicated by Acute Respiratory Failure due to Aspiration of vomitus requiring s/p cardiac arrest  transfer to ICU and intubation.  5/11 flexible bronchoscopy done by critical care team 5/12 remains intubated 5/13 remains on vent, s/p PEG tube, +vomiting last night, failed SAT/SBT 5/14 extubated successfully but with severe delirium 5/20.  Botulinum toxin injection into the lower esophageal sphincter by Dr. Ole Berkeley   5/21 esophagram showing diffusely distended esophagus with distal obstruction and severe dysmotility suggestive of achalasia. 5/22.  Advised I would not give him any food and will continue PEG feeding he can try liquids to see if that goes down but still high risk for aspiration.  Patient had a fall on the evening of 5/22. 5/23.  Patient feels okay.  Awaken from sleep.  Did not offer any complaints.  Awaiting insurance authorization for rehab. 5/24.  Patient feeling okay.  States he is doing okay with the liquid diet.  Still feels weak. 5/25.  Patient pulled out PEG tube this morning.  Foley catheter  placed in the stoma.  GI able to place a another PEG tube and remove the Foley. 5/26.  Creatinine up at 1.83.  Continue IV fluid hydration.  Patient started having pain after tube feeding started.  CT scan showed that the PEG tube was not in the correct place.  Notified gastroenterology.  Started empiric antibiotics. 5/27.  Patient was taken urgently to the operating room by Dr. Cornel Diesel because the patient had fever and elevated white count with suspected intra-abdominal sepsis. 5/29: Agitation/sundowning noted around shift change 5/28.  Suspect hospital-acquired delirium.  Received doses of atypical antipsychotics.  Much more calm, awake, alert in AM. 6/1: Foley discontinued yesterday.  Has some AKI.  Has been urine.  Mental status improved. 6/2: Kidney function improving, hypernatremia improving  Assessment & Plan:   Principal Problem:   Severe sepsis (HCC) Active Problems:   Gastrostomy complication (HCC)   Dysphagia   Achalasia of esophagus   AKI (acute kidney injury) (HCC)   Acute delirium   Acute respiratory failure with hypoxia and hypercapnia (HCC)   Essential hypertension, benign   GERD (gastroesophageal reflux disease)   Multifocal pneumonia   History of dysphagia   Aspiration pneumonia (HCC)   Obesity (BMI 30-39.9)   Generalized weakness   Generalized abdominal pain   Severe sepsis (HCC) Recurrence with different problem.  Intra-abdominal infection secondary to PEG tube not being in the right place.  Elevated white count and fever and acute kidney injury.  Patient brought emergently to the operating room on 5/27 by Dr. Cornel Diesel.   Plan: Continue tube feeds, currently at goal N.p.o. for the foreseeable future Continue IV Zosyn  for now, can  de-escalate to oral regimen if patient leaves the hospital prior to completion of a total 2-week course of antibiotic  Gastrostomy complication (HCC) Patient self discontinued PEG tube and malpositioning likely resulted in some  intra-abdominal sepsis.  Tube feeds were held.  Restarted 5/28.  Continue at goal rate   AKI (acute kidney injury) (HCC) Improving.  Increased free water  flushes to 100 q4h   Achalasia of esophagus Status post botulinum toxin injection on 5/20.   Esophagram showing dilated esophagus distal obstruction. Per GI esophagram continues to demonstrate severe dysmotility with dilated esophagus.  Consistent with end-stage esophageal disease.  Patient is n.p.o. indefinitely.  Should only be fed via PEG tube considering the risk of aspiration. Will need outpatient referral to tertiary advanced GI for consideration of POEMS procedure.  Recommend this only after a few weeks recovery period.  Discussed recommendations with patient's granddaughter via phone   Dysphagia Likely secondary to achalasia.  Patient had Botox  injection of the lower esophageal sphincter due to lower esophageal sphincter hypertension causing the patient not being able to swallow and empty the esophagus of his ingested food.  Food was in the esophagus on the previous EGD.  NPO for forseeable future     Acute delirium Agitation/sundowning Continue as needed Haldol . Use only for severe agitation Continue Seroquel  25 mg per tube nightly Continue Seroquel  25mg  daily   Acute respiratory failure with hypoxia and hypercapnia (HCC) Pneumonia Now resolved   Essential hypertension, benign Hold Norvasc    GERD (gastroesophageal reflux disease) Continue PPI     Generalized weakness Therapy evaluations as tolerated Will benefit from placement to skilled nursing facility   Obesity (BMI 30-39.9) BMI 33.16 Complicating factor in overall care and prognosis    DVT prophylaxis: SCD Code Status: Full Family Communication: granddaughter [name] [phone] on 5/30, 6/2 Disposition Plan: Status is: Inpatient Remains inpatient appropriate because: Multiple acute issues as above   Level of care: Progressive  Consultants:  General  surgery  Procedures:  Ex lap  Antimicrobials: Zosyn    Subjective: Seen and examined.  No acute events overnight.  Sleepy this AM  Objective: Vitals:   10/28/23 0412 10/28/23 0723 10/28/23 1110 10/28/23 1313  BP: 135/74 127/72  117/64  Pulse: 76 65  77  Resp: 18 17 (!) 35 18  Temp: 97.8 F (36.6 C) 98 F (36.7 C)  98.1 F (36.7 C)  TempSrc: Oral   Oral  SpO2: 94% (!) 88% 100% (!) 88%  Weight:      Height:        Intake/Output Summary (Last 24 hours) at 10/28/2023 1410 Last data filed at 10/28/2023 1328 Gross per 24 hour  Intake 2387.87 ml  Output 2150 ml  Net 237.87 ml   Filed Weights   10/24/23 0500 10/26/23 0421 10/27/23 0500  Weight: 102.5 kg 100.7 kg 98.4 kg    Examination:  General exam: NAD.  Appears fatigued and chronically ill Respiratory system: Coarse breath sounds, normal WOB, RA Cardiovascular system: S1-S2, RRR, no murmurs, no pedal edema Gastrointestinal system: Soft, NT ND, + PEG Central nervous system: Alert and oriented x 2. No focal neurological deficits. Extremities: Symmetrically decreased power bilaterally Skin: No rashes, lesions or ulcers Psychiatry: Judgement and insight appear impaired. Mood & affect confused.     Data Reviewed: I have personally reviewed following labs and imaging studies  CBC: Recent Labs  Lab 10/22/23 0100 10/22/23 0315 10/23/23 0402 10/24/23 0519 10/28/23 0408  WBC 18.7* 19.7* 12.6* 8.1 7.7  NEUTROABS 16.4* 17.2*  --   --  5.1  HGB 11.3* 11.0* 9.9* 8.8* 9.9*  HCT 32.9* 32.1* 29.1* 25.6* 30.0*  MCV 94.5 94.1 95.1 94.1 95.8  PLT 300 286 273 267 314   Basic Metabolic Panel: Recent Labs  Lab 10/24/23 0519 10/25/23 0600 10/26/23 0449 10/27/23 0452 10/28/23 0408  NA 141 143 146* 147* 146*  K 3.3* 3.2* 3.7 3.6 3.8  CL 109 107 112* 112* 109  CO2 25 28 26 27 27   GLUCOSE 125* 124* 140* 131* 116*  BUN 23 20 19 22  26*  CREATININE 1.40* 1.62* 1.80* 2.01* 1.83*  CALCIUM 7.7* 8.0* 7.9* 7.7* 7.9*  MG  --   2.2 2.2 2.3  --   PHOS  --  3.3 4.1 4.0  --    GFR: Estimated Creatinine Clearance: 34.8 mL/min (A) (by C-G formula based on SCr of 1.83 mg/dL (H)). Liver Function Tests: Recent Labs  Lab 10/22/23 0100 10/23/23 0402  AST 21 17  ALT 21 17  ALKPHOS 78 62  BILITOT 0.6 0.8  PROT 6.6 6.3*  ALBUMIN 2.8* 2.4*   No results for input(s): "LIPASE", "AMYLASE" in the last 168 hours. No results for input(s): "AMMONIA" in the last 168 hours. Coagulation Profile: Recent Labs  Lab 10/22/23 0100  INR 1.2   Cardiac Enzymes: No results for input(s): "CKTOTAL", "CKMB", "CKMBINDEX", "TROPONINI" in the last 168 hours. BNP (last 3 results) No results for input(s): "PROBNP" in the last 8760 hours. HbA1C: No results for input(s): "HGBA1C" in the last 72 hours. CBG: Recent Labs  Lab 10/27/23 2037 10/27/23 2332 10/28/23 0402 10/28/23 0719 10/28/23 1313  GLUCAP 108* 123* 124* 134* 127*   Lipid Profile: No results for input(s): "CHOL", "HDL", "LDLCALC", "TRIG", "CHOLHDL", "LDLDIRECT" in the last 72 hours. Thyroid  Function Tests: No results for input(s): "TSH", "T4TOTAL", "FREET4", "T3FREE", "THYROIDAB" in the last 72 hours. Anemia Panel: No results for input(s): "VITAMINB12", "FOLATE", "FERRITIN", "TIBC", "IRON", "RETICCTPCT" in the last 72 hours. Sepsis Labs: Recent Labs  Lab 10/22/23 0100 10/22/23 0315  LATICACIDVEN 1.3 1.1    Recent Results (from the past 240 hours)  Culture, blood (x 2)     Status: None   Collection Time: 10/22/23  1:00 AM   Specimen: BLOOD  Result Value Ref Range Status   Specimen Description BLOOD BLOOD RIGHT ARM  Final   Special Requests   Final    BOTTLES DRAWN AEROBIC AND ANAEROBIC Blood Culture adequate volume   Culture   Final    NO GROWTH 5 DAYS Performed at [facility], [address], [place], [place] [address]    Report Status 10/27/2023 FINAL  Final  Culture, blood (x 2)     Status: None   Collection Time: 10/22/23  1:00 AM    Specimen: BLOOD  Result Value Ref Range Status   Specimen Description BLOOD BLOOD RIGHT ARM  Final   Special Requests   Final    BOTTLES DRAWN AEROBIC AND ANAEROBIC Blood Culture adequate volume   Culture   Final    NO GROWTH 5 DAYS Performed at [facility], [address] [place], [place] [address]    Report Status 10/27/2023 FINAL  Final         Radiology Studies: [facility] Chest Port 1 View Result Date: 10/28/2023 CLINICAL DATA:  Shortness of breath EXAM: PORTABLE CHEST 1 VIEW COMPARISON:  Oct 12, 2023 FINDINGS: Mild prominence of the interstitial markings may correlate with subtle chronic interstitial lung disease, superimposed congestive changes not excluded., comparison with prior examination there  is some interval improvement in the bilateral previously described pulmonary infiltrates. IMPRESSION: Mild prominence of the interstitial markings may correlate with subtle chronic interstitial lung disease, superimposed congestive changes not excluded. Electronically Signed   By: Fredrich Jefferson M.D.   On: 10/28/2023 09:03         Scheduled Meds:  Chlorhexidine  Gluconate Cloth  6 each Topical Q0600   feeding supplement (PROSource TF20)  60 mL Per Tube Daily   free water   100 mL Per Tube Q4H   gentamicin  ointment   Topical TID   insulin  aspart  0-9 Units Subcutaneous Q4H   mouth rinse  15 mL Mouth Rinse 4 times per day   pantoprazole  (PROTONIX ) IV  40 mg Intravenous Q24H   QUEtiapine   25 mg Per Tube Q breakfast   QUEtiapine   25 mg Per Tube QHS   Continuous Infusions:  feeding supplement (OSMOLITE 1.5 CAL) 1,000 mL (10/27/23 2351)     LOS: 23 days      Tiajuana Fluke, MD [facility]   If 7PM-7AM, please contact night-coverage  10/28/2023, 2:10 PM

## 2023-10-28 NOTE — Progress Notes (Addendum)
 Occupational Therapy Treatment Patient Details Name: Robert Vega MRN: [id number] DOB: Dec 12, 1940 Today's Date: 10/28/2023   History of present illness 83 y.o male with significant PMH of OSA, GERD, Obesity, HTN, Dysphagia: EGD 03/2022 with food in upper esophagus complicated by aspiration event, cardiac arrest with round of CPR, and post resuscitation EGD with concern for lack of peristalsis. Pt presented to ED on 10/05/2023 with hypoxia, fever and generalized weakness; developed acute respiratory failure requiring intubation 5/10 due to aspiration pneumonia, extubated 5/15, but had significant agitation required brief course of Precedex ; pt now s/p exploratory laparotomy with closure of gastrotomy and insertion of gastrostomy tube on 10/22/23   OT comments  Pt is supine in bed on arrival. Lethargic, however answered all questions and able to follow one step commands fairly consistently. Agreeable to OT session, sitter present. Pt opens his eye intermittently throughout session. Able to perform figure four at bed level to don sock, unable to don without Max A, however able to doff socks. Did not c/o pain. Pt performed bed mobility to reach EOB with Max A x2, pt able to initiate and hold therapist's hands. Pt required Mod A progressing to SBA for seated balance at EOB. Able to wash his face with min a once placed in his hands and cueing provided. X2 STS trials from elevated bed height with Mod A to RW, pt pulling on RW, unable to understanding how to push up from EOB. Tolerated x1-2 mins standing tolerance for peri-care to be performed with Max A. Took a few lateral steps to [facility] with Mod A, verb cues and RW management. Pt able to lateral scoot with Mod A towards HOB, then needed Max A X2 to return to supine and scoot to [facility]. Mittens replaced d/t agitation/fidgeting.  Pt returned to bed with all needs in place and will cont to require skilled acute OT services to maximize his safety and IND to return to  [facility].       If plan is discharge home, recommend the following:  Assistance with cooking/housework;Help with stairs or ramp for entrance;Supervision due to cognitive status;A lot of help with walking and/or transfers;A lot of help with bathing/dressing/bathroom   Equipment Recommendations  Other (comment) (defer)    Recommendations for Other Services      Precautions / Restrictions Precautions Precautions: Fall Recall of Precautions/Restrictions: Impaired Precaution/Restrictions Comments: abdominal binder donned. Restrictions Weight Bearing Restrictions Per Provider Order: No       Mobility Bed Mobility Overal bed mobility: Needs Assistance Bed Mobility: Supine to Sit, Sit to Supine     Supine to sit: HOB elevated, +2 for physical assistance, Max assist Sit to supine: Max assist, +2 for physical assistance   General bed mobility comments: x2 max A for all bed mobility this date    Transfers Overall transfer level: Needs assistance Equipment used: Rolling walker (2 wheels) Transfers: Sit to/from Stand Sit to Stand: Mod assist, From elevated surface           General transfer comment: x2 STS trials performed from elevated bed height to RW with pt initating STS and Mod A required to reach upright standing and maintain for ~1-2 min each trial; took a few ~2-3 side steps to [facility]     Balance Overall balance assessment: Needs assistance Sitting-balance support: Feet supported, Bilateral upper extremity supported Sitting balance-Leahy Scale: Fair Sitting balance - Comments: initally Mod A progressing to CGA/SBA for safety   Standing balance support: During functional activity, Bilateral upper extremity supported,  Reliant on assistive device for balance Standing balance-Leahy Scale: Poor Standing balance comment: Mod A for STS from EOB to RW and good tolerance with Min A for safety                           ADL either performed or assessed with clinical  judgement   ADL Overall ADL's : Needs assistance/impaired     Grooming: Wash/dry face;Sitting;Cueing for sequencing;Cueing for compensatory techniques;Minimal assistance Grooming Details (indicate cue type and reason): placement onto his hand and pt able to take into both hands and wash his face without assist seated EOB                     Toileting- Clothing Manipulation and Hygiene: Maximal assistance;Sit to/from stand Toileting - Clothing Manipulation Details (indicate cue type and reason): peri-care in standing            Extremity/Trunk Assessment              Vision       Perception     Praxis     Communication Communication Communication: Impaired Factors Affecting Communication: Reduced clarity of speech   Cognition Arousal: Lethargic, Suspect due to medications Behavior During Therapy: Impulsive                                 Following commands: Impaired Following commands impaired: Follows one step commands with increased time      Cueing   Cueing Techniques: Verbal cues, Tactile cues  Exercises      Shoulder Instructions       General Comments lines/leads intact, mittens replaced and sitter at bedside    Pertinent Vitals/ Pain       Pain Assessment Pain Assessment: Faces Faces Pain Scale: No hurt Pain Intervention(s): Monitored during session  Home Living                                          Prior Functioning/Environment              Frequency  Min 3X/week        Progress Toward Goals  OT Goals(current goals can now be found in the care plan section)  Progress towards OT goals: Progressing toward goals  Acute Rehab OT Goals Patient Stated Goal: "go home" OT Goal Formulation: With patient Time For Goal Achievement: 11/06/23 Potential to Achieve Goals: Fair  Plan      Co-evaluation                 AM-PAC OT "6 Clicks" Daily Activity     Outcome Measure   Help  from another person eating meals?: Total (feeding tube) Help from another person taking care of personal grooming?: A Little Help from another person toileting, which includes using toliet, bedpan, or urinal?: A Lot Help from another person bathing (including washing, rinsing, drying)?: A Lot Help from another person to put on and taking off regular upper body clothing?: A Little Help from another person to put on and taking off regular lower body clothing?: A Lot 6 Click Score: 13    End of Session Equipment Utilized During Treatment: Gait belt;Rolling walker (2 wheels)  OT Visit Diagnosis: Unsteadiness on feet (R26.81);Repeated falls (R29.6);Muscle weakness (generalized) (M62.81)  Activity Tolerance Patient tolerated treatment well   Patient Left in bed;with call bell/phone within reach;with bed alarm set;with nursing/sitter in room   Nurse Communication Mobility status        Time: 6213-0865 OT Time Calculation (min): 28 min  Charges: OT General Charges $OT Visit: 1 Visit OT Treatments $Self Care/Home Management : 8-22 mins $Therapeutic Activity: 8-22 mins  Mariapaula Krist, OTR/L  10/28/23, 1:33 PM   Lillyana Majette E Noriah Osgood 10/28/2023, 1:28 PM

## 2023-10-28 NOTE — Progress Notes (Signed)
 Physical Therapy Treatment Patient Details Name: Robert Vega MRN: [id number] DOB: January 22, 1941 Today's Date: 10/28/2023   History of Present Illness 83 y.o male with significant PMH of OSA, GERD, Obesity, HTN, Dysphagia: EGD 03/2022 with food in upper esophagus complicated by aspiration event, cardiac arrest with round of CPR, and post resuscitation EGD with concern for lack of peristalsis. Pt presented to ED on 10/05/2023 with hypoxia, fever and generalized weakness; developed acute respiratory failure requiring intubation 5/10 due to aspiration pneumonia, extubated 5/15, but had significant agitation required brief course of Precedex ; pt now s/p exploratory laparotomy with closure of gastrotomy and insertion of gastrostomy tube on 10/22/23    PT Comments  Pt received supine in bed with sitter present. Pt sleeping with eyes closed with mittens donned. Elevated HOB and turned lights on to stimulate pt but remains with eyes closed. Responds to tactile stimulation and mumbles to author. Pt's mitts doffed and maxA+2 to sit EOB. Initially resistive with heavy posterior leaning needing maxA but as time progresses pt is able to sit unsupported with close SBA. Responding to single step commands with increased time once sitting (I.e. squeezing my hand, follows commands to stand). X2 STS performed at [facility] with bed elevated. Able to take lateral side steps to the L with modA and R lateral lean into PT. Deferring further distance due to lethargy and not opening eyes throughout session. MaxA+1 to return to supine and maxA+2 to scoot up in bed with all needs in reach and mitts donned with sitter at bedside. D/c recs remain appropriate.    If plan is discharge home, recommend the following: A lot of help with walking and/or transfers;A lot of help with bathing/dressing/bathroom;Help with stairs or ramp for entrance;Assist for transportation;Direct supervision/assist for financial management;Assistance with  cooking/housework;Direct supervision/assist for medications management   Can travel by private vehicle     No  Equipment Recommendations  Other (comment) (TBD)    Recommendations for Other Services       Precautions / Restrictions Precautions Precautions: Fall Recall of Precautions/Restrictions: Impaired Precaution/Restrictions Comments: abdominal binder donned. Restrictions Weight Bearing Restrictions Per Provider Order: No     Mobility  Bed Mobility Overal bed mobility: Needs Assistance Bed Mobility: Supine to Sit, Sit to Supine     Supine to sit: Mod assist, HOB elevated, +2 for physical assistance Sit to supine: Max assist     Patient Response: Cooperative  Transfers Overall transfer level: Needs assistance Equipment used: 1 person hand held assist Transfers: Sit to/from Stand Sit to Stand: Mod assist, From elevated surface           General transfer comment: pt initiates standing. Able to take lateral steps to the L towards HOB.    Ambulation/Gait               General Gait Details: deferred due to lethargy   Stairs             Wheelchair Mobility     Tilt Bed Tilt Bed Patient Response: Cooperative  Modified Rankin (Stroke Patients Only)       Balance Overall balance assessment: Needs assistance Sitting-balance support: Feet supported, Bilateral upper extremity supported Sitting balance-Leahy Scale: Fair Sitting balance - Comments: initially poor but with time, able to transition to SBA for static sitting.   Standing balance support: Single extremity supported, During functional activity Standing balance-Leahy Scale: Poor Standing balance comment: limited hip/knee extension in standing  Communication Communication Communication: Impaired Factors Affecting Communication: Reduced clarity of speech  Cognition Arousal: Lethargic, Suspect due to medications Behavior During Therapy:  Impulsive   PT - Cognitive impairments: History of cognitive impairments                       PT - Cognition Comments: difficulty participating keeping eyes closed. Follows single step commands with increased time Following commands: Impaired Following commands impaired: Follows one step commands with increased time    Cueing Cueing Techniques: Verbal cues, Tactile cues  Exercises      General Comments General comments (skin integrity, edema, etc.): all lines/leads in place and mitts donned post session. Sitter at bedside.      Pertinent Vitals/Pain Pain Assessment Pain Assessment: Faces Faces Pain Scale: No hurt    Home Living                          Prior Function            PT Goals (current goals can now be found in the care plan section) Acute Rehab PT Goals Patient Stated Goal: none stated PT Goal Formulation: Patient unable to participate in goal setting Time For Goal Achievement: 11/06/23 Potential to Achieve Goals: Fair Progress towards PT goals: Not progressing toward goals - comment (remains lethargic)    Frequency    Min 3X/week      PT Plan      Co-evaluation              AM-PAC PT "6 Clicks" Mobility   Outcome Measure  Help needed turning from your back to your side while in a flat bed without using bedrails?: A Lot Help needed moving from lying on your back to sitting on the side of a flat bed without using bedrails?: Total Help needed moving to and from a bed to a chair (including a wheelchair)?: Total Help needed standing up from a chair using your arms (e.g., wheelchair or bedside chair)?: A Lot Help needed to walk in hospital room?: A Lot Help needed climbing 3-5 steps with a railing? : Total 6 Click Score: 9    End of Session Equipment Utilized During Treatment: Gait belt Activity Tolerance: Patient limited by lethargy Patient left: in bed;with call bell/phone within reach;with bed alarm set;with  nursing/sitter in room Nurse Communication: Mobility status PT Visit Diagnosis: Unsteadiness on feet (R26.81);Repeated falls (R29.6);Muscle weakness (generalized) (M62.81);Difficulty in walking, not elsewhere classified (R26.2);Other abnormalities of gait and mobility (R26.89);Pain     Time: 4540-9811 PT Time Calculation (min) (ACUTE ONLY): 21 min  Charges:    $Therapeutic Activity: 8-22 mins PT General Charges $$ ACUTE PT VISIT: 1 Visit                     Marc Senior. Fairly IV, PT, DPT Physical Therapist- [place]  [facility] 10/28/2023, 12:20 PM

## 2023-10-28 NOTE — TOC Progression Note (Signed)
 Transition of Care [facility]) - Progression Note    Patient Details  Name: Robert Vega MRN: [id number] Date of Birth: June 23, 1940  Transition of Care [facility]) CM/SW Contact  Odilia Bennett, LCSW Phone Number: 10/28/2023, 3:47 PM  Clinical Narrative:   CSW spoke to granddaughter and emailed bed offer update to her. Included facilities that have offered, were considering, and had not responded yet. CSW also included the link to the Medicare website so she can review ratings.  Expected Discharge Plan and Services                                               Social Determinants of Health (SDOH) Interventions SDOH Screenings   Food Insecurity: Patient Unable To Answer (10/06/2023)  Recent Concern: Food Insecurity - Food Insecurity Present (09/11/2023)   Received from [facility]  Housing: Patient Unable To Answer (10/06/2023)  Recent Concern: Housing - High Risk (09/11/2023)   Received from [facility]  Transportation Needs: Patient Unable To Answer (10/06/2023)  Utilities: Patient Unable To Answer (10/06/2023)  Depression (PHQ2-9): Low Risk  (05/07/2023)  Financial Resource Strain: Medium Risk (09/11/2023)   Received from [facility]  Social Connections: Unknown (10/06/2023)  Tobacco Use: Medium Risk (10/22/2023)    Readmission Risk Interventions     No data to display

## 2023-10-28 NOTE — Progress Notes (Signed)
   10/28/23 1110  Assess: MEWS Score  Resp (!) 35  Level of Consciousness Alert  SpO2 100 %  O2 Device Room Air  Assess: MEWS Score  MEWS Temp 0  MEWS Systolic 0  MEWS Pulse 0  MEWS RR 2  MEWS LOC 0  MEWS Score 2  MEWS Score Color Yellow  Assess: if the MEWS score is Yellow or Red  Were vital signs accurate and taken at a resting state? Yes  Does the patient meet 2 or more of the SIRS criteria? No  MEWS guidelines implemented  Yes, yellow  Treat  MEWS Interventions Considered administering scheduled or prn medications/treatments as ordered  Take Vital Signs  Increase Vital Sign Frequency  Yellow: Q2hr x1, continue Q4hrs until patient remains green for 12hrs  Escalate  MEWS: Escalate Yellow: Discuss with charge nurse and consider notifying provider and/or RRT  Notify: Charge Nurse/RN  Name of Charge Nurse/RN Notified Amy  Provider Notification  Provider Name/Title t  Date Provider Notified 10/28/23  Time Provider Notified 1110  Method of Notification Call  Notification Reason Other (Comment) (yellow MEWS)  Provider response See new orders  Date of Provider Response 10/28/23  Time of Provider Response 1110  Assess: SIRS CRITERIA  SIRS Temperature  0  SIRS Respirations  1  SIRS Pulse 0  SIRS WBC 0  SIRS Score Sum  1

## 2023-10-29 ENCOUNTER — Inpatient Hospital Stay

## 2023-10-29 DIAGNOSIS — R0902 Hypoxemia: Secondary | ICD-10-CM | POA: Diagnosis not present

## 2023-10-29 DIAGNOSIS — A419 Sepsis, unspecified organism: Secondary | ICD-10-CM | POA: Diagnosis not present

## 2023-10-29 DIAGNOSIS — R6520 Severe sepsis without septic shock: Secondary | ICD-10-CM | POA: Diagnosis not present

## 2023-10-29 DIAGNOSIS — I771 Stricture of artery: Secondary | ICD-10-CM | POA: Diagnosis not present

## 2023-10-29 DIAGNOSIS — R0989 Other specified symptoms and signs involving the circulatory and respiratory systems: Secondary | ICD-10-CM | POA: Diagnosis not present

## 2023-10-29 DIAGNOSIS — I517 Cardiomegaly: Secondary | ICD-10-CM | POA: Diagnosis not present

## 2023-10-29 LAB — BASIC METABOLIC PANEL WITH GFR
Anion gap: 10 (ref 5–15)
BUN: 36 mg/dL — ABNORMAL HIGH (ref 8–23)
CO2: 26 mmol/L (ref 22–32)
Calcium: 7.8 mg/dL — ABNORMAL LOW (ref 8.9–10.3)
Chloride: 108 mmol/L (ref 98–111)
Creatinine, Ser: 2.15 mg/dL — ABNORMAL HIGH (ref 0.61–1.24)
GFR, Estimated: 30 mL/min — ABNORMAL LOW (ref >60)
Glucose, Bld: 122 mg/dL — ABNORMAL HIGH (ref 70–99)
Potassium: 4.4 mmol/L (ref 3.5–5.1)
Sodium: 144 mmol/L (ref 135–145)

## 2023-10-29 LAB — CBC WITH DIFFERENTIAL/PLATELET
Abs Immature Granulocytes: 0.23 10*3/uL — ABNORMAL HIGH (ref 0.00–0.07)
Basophils Absolute: 0 10*3/uL (ref 0.0–0.1)
Basophils Relative: 0 %
Eosinophils Absolute: 0.3 10*3/uL (ref 0.0–0.5)
Eosinophils Relative: 2 %
HCT: 32 % — ABNORMAL LOW (ref 39.0–52.0)
Hemoglobin: 10.6 g/dL — ABNORMAL LOW (ref 13.0–17.0)
Immature Granulocytes: 2 %
Lymphocytes Relative: 11 %
Lymphs Abs: 1.1 10*3/uL (ref 0.7–4.0)
MCH: 32.1 pg (ref 26.0–34.0)
MCHC: 33.1 g/dL (ref 30.0–36.0)
MCV: 97 fL (ref 80.0–100.0)
Monocytes Absolute: 1.3 10*3/uL — ABNORMAL HIGH (ref 0.1–1.0)
Monocytes Relative: 12 %
Neutro Abs: 7.6 10*3/uL (ref 1.7–7.7)
Neutrophils Relative %: 73 %
Platelets: 314 10*3/uL (ref 150–400)
RBC: 3.3 MIL/uL — ABNORMAL LOW (ref 4.22–5.81)
RDW: 13.1 % (ref 11.5–15.5)
WBC: 10.6 10*3/uL — ABNORMAL HIGH (ref 4.0–10.5)
nRBC: 0 % (ref 0.0–0.2)

## 2023-10-29 LAB — GLUCOSE, CAPILLARY
Glucose-Capillary: 117 mg/dL — ABNORMAL HIGH (ref 70–99)
Glucose-Capillary: 125 mg/dL — ABNORMAL HIGH (ref 70–99)
Glucose-Capillary: 127 mg/dL — ABNORMAL HIGH (ref 70–99)
Glucose-Capillary: 110 mg/dL — ABNORMAL HIGH (ref 70–99)
Glucose-Capillary: 130 mg/dL — ABNORMAL HIGH (ref 70–99)

## 2023-10-29 MED ORDER — LACTATED RINGERS IV SOLN: INTRAVENOUS | Status: AC

## 2023-10-29 NOTE — Progress Notes (Signed)
 PROGRESS NOTE    Robert Vega  ZOX:[id number] DOB: [birth year] DOA: 10/05/2023 PCP: Helaine Llanos, MD    Brief Narrative:  83 y.o male with significant PMH of OSA, GERD, Obesity, HTN, Dysphagia: EGD 03/2022 with food in upper esophagus complicated by aspiration event, cardiac arrest with round of CPR, and post resuscitation EGD with concern for lack of peristalsis. Prior EGD 08/2020 with note of abnormal cricopharyngeus, decrease in motility in esophagus, and spastic LES who presented to the ED from with hypoxia, fever and generalized weakness.  Patient developed acute respiratory failure requiring intubation due to aspiration pneumonia, he was treated with broad-spectrum antibiotics and managed in ICU. Condition had improved, extubated 5/15, but has significant agitation required brief course of Precedex . 5/10: Admit to [facility] service with sepsis due to Aspiration Pneumonia.  Course complicated by Acute Respiratory Failure due to Aspiration of vomitus requiring s/p cardiac arrest  transfer to ICU and intubation.  5/11 flexible bronchoscopy done by critical care team 5/12 remains intubated 5/13 remains on vent, s/p PEG tube, +vomiting last night, failed SAT/SBT 5/14 extubated successfully but with severe delirium 5/20.  Botulinum toxin injection into the lower esophageal sphincter by Dr. Ole Berkeley   5/21 esophagram showing diffusely distended esophagus with distal obstruction and severe dysmotility suggestive of achalasia. 5/22.  Advised I would not give him any food and will continue PEG feeding he can try liquids to see if that goes down but still high risk for aspiration.  Patient had a fall on the evening of 5/22. 5/23.  Patient feels okay.  Awaken from sleep.  Did not offer any complaints.  Awaiting insurance authorization for rehab. 5/24.  Patient feeling okay.  States he is doing okay with the liquid diet.  Still feels weak. 5/25.  Patient pulled out PEG tube this morning.  Foley catheter  placed in the stoma.  GI able to place a another PEG tube and remove the Foley. 5/26.  Creatinine up at 1.83.  Continue IV fluid hydration.  Patient started having pain after tube feeding started.  CT scan showed that the PEG tube was not in the correct place.  Notified gastroenterology.  Started empiric antibiotics. 5/27.  Patient was taken urgently to the operating room by Dr. Cornel Diesel because the patient had fever and elevated white count with suspected intra-abdominal sepsis. 5/29: Agitation/sundowning noted around shift change 5/28.  Suspect hospital-acquired delirium.  Received doses of atypical antipsychotics.  Much more calm, awake, alert in AM. 6/1: Foley discontinued yesterday.  Has some AKI.  Has been urine.  Mental status improved. 6/2: Kidney function improving, hypernatremia improving 6/3: Lethargy/confusion noted by PT today  Assessment & Plan:   Principal Problem:   Severe sepsis (HCC) Active Problems:   Gastrostomy complication (HCC)   Dysphagia   Achalasia of esophagus   AKI (acute kidney injury) (HCC)   Acute delirium   Acute respiratory failure with hypoxia and hypercapnia (HCC)   Essential hypertension, benign   GERD (gastroesophageal reflux disease)   Multifocal pneumonia   History of dysphagia   Aspiration pneumonia (HCC)   Obesity (BMI 30-39.9)   Generalized weakness   Generalized abdominal pain   Severe sepsis (HCC) Recurrence with different problem.  Intra-abdominal infection secondary to PEG tube not being in the right place.  Elevated white count and fever and acute kidney injury.  Patient brought emergently to the operating room on 5/27 by Dr. Cornel Diesel.   Plan: Continue tube feeds N.p.o. for the foreseeable future Continue IV Zosyn   for now, can de-escalate to oral regimen if patient leaves the hospital prior to completion of a total 2-week course of antibiotic  Gastrostomy complication (HCC) Patient self discontinued PEG tube and malpositioning likely  resulted in some intra-abdominal sepsis.  Tube feeds were held.  Restarted 5/28.  Continue at goal rate   AKI (acute kidney injury) (HCC) Restart LR 100mL q4h free  water  flushes via PEG   Achalasia of esophagus Status post botulinum toxin injection on 5/20.   Esophagram showing dilated esophagus distal obstruction. Per GI esophagram continues to demonstrate severe dysmotility with dilated esophagus.  Consistent with end-stage esophageal disease.  Patient is n.p.o. indefinitely.  Should only be fed via PEG tube considering the risk of aspiration. Will need outpatient referral to tertiary advanced GI for consideration of POEMS procedure.  Recommend this only after a few weeks recovery period.  Discussed recommendations with patient's granddaughter via phone   Dysphagia Likely secondary to achalasia.  Patient had Botox  injection of the lower esophageal sphincter due to lower esophageal sphincter hypertension causing the patient not being able to swallow and empty the esophagus of his ingested food.  Food was in the esophagus on the previous EGD.  NPO for forseeable future     Acute delirium Agitation/sundowning Lethargy preventing effective therapy Plan: Continue as needed Haldol . Use only for severe agitation Continue Seroquel  25 mg per tube nightly Discontinue daytime Seroquel    Acute respiratory failure with hypoxia and hypercapnia (HCC) Pneumonia Now resolved   Essential hypertension, benign Hold Norvasc    GERD (gastroesophageal reflux disease) Continue PPI     Generalized weakness Therapy evaluations as tolerated Will benefit from placement to skilled nursing facility   Obesity (BMI 30-39.9) BMI 33.16 Complicating factor in overall care and prognosis    DVT prophylaxis: SCD Code Status: Full Family Communication: granddaughter [name] [phone] on 5/30, 6/2, 6/3 Disposition Plan: Status is: Inpatient Remains inpatient appropriate because: Multiple acute issues  as above   Level of care: Progressive  Consultants:  General surgery  Procedures:  Ex lap  Antimicrobials: Zosyn    Subjective: Seen and examined. No acute events.  Sleepy/lethargic this AM.  Objective: Vitals:   10/29/23 0500 10/29/23 0746 10/29/23 1124 10/29/23 1733  BP:  (!) 112/58 (!) 106/58 139/76  Pulse:  76 88 97  Resp:  19 19 18   Temp:  98.5 F (36.9 C) 99.2 F (37.3 C)   TempSrc:      SpO2:  96% 96% 95%  Weight: 98.9 kg     Height:        Intake/Output Summary (Last 24 hours) at 10/29/2023 1737 Last data filed at 10/29/2023 0600 Gross per 24 hour  Intake 870 ml  Output 1200 ml  Net -330 ml   Filed Weights   10/26/23 0421 10/27/23 0500 10/29/23 0500  Weight: 100.7 kg 98.4 kg 98.9 kg    Examination:  General exam: Appears fatigued Respiratory system: Coarse breath sounds, normal WOB, RA Cardiovascular system: S1-S2, RRR, no murmurs, no pedal edema Gastrointestinal system: Soft, NT ND, + PEG Central nervous system: Alert and oriented x 2. No focal neurological deficits. Extremities: Decreased power BLE.  Gait not assessed Skin: No rashes, lesions or ulcers Psychiatry: Judgement and insight appear impaired. Mood & affect confused.     Data Reviewed: I have personally reviewed following labs and imaging studies  CBC: Recent Labs  Lab 10/23/23 0402 10/24/23 0519 10/28/23 0408 10/29/23 0644  WBC 12.6* 8.1 7.7 10.6*  NEUTROABS  --   --  5.1 7.6  HGB 9.9* 8.8* 9.9* 10.6*  HCT 29.1* 25.6* 30.0* 32.0*  MCV 95.1 94.1 95.8 97.0  PLT 273 267 314 314   Basic Metabolic Panel: Recent Labs  Lab 10/25/23 0600 10/26/23 0449 10/27/23 0452 10/28/23 0408 10/29/23 0644  NA 143 146* 147* 146* 144  K 3.2* 3.7 3.6 3.8 4.4  CL 107 112* 112* 109 108  CO2 28 26 27 27 26   GLUCOSE 124* 140* 131* 116* 122*  BUN 20 19 22  26* 36*  CREATININE 1.62* 1.80* 2.01* 1.83* 2.15*  CALCIUM 8.0* 7.9* 7.7* 7.9* 7.8*  MG 2.2 2.2 2.3  --   --   PHOS 3.3 4.1 4.0  --   --     GFR: Estimated Creatinine Clearance: 29.7 mL/min (A) (by C-G formula based on SCr of 2.15 mg/dL (H)). Liver Function Tests: Recent Labs  Lab 10/23/23 0402  AST 17  ALT 17  ALKPHOS 62  BILITOT 0.8  PROT 6.3*  ALBUMIN 2.4*   No results for input(s): "LIPASE", "AMYLASE" in the last 168 hours. No results for input(s): "AMMONIA" in the last 168 hours. Coagulation Profile: No results for input(s): "INR", "PROTIME" in the last 168 hours.  Cardiac Enzymes: No results for input(s): "CKTOTAL", "CKMB", "CKMBINDEX", "TROPONINI" in the last 168 hours. BNP (last 3 results) No results for input(s): "PROBNP" in the last 8760 hours. HbA1C: No results for input(s): "HGBA1C" in the last 72 hours. CBG: Recent Labs  Lab 10/28/23 2033 10/28/23 2056 10/29/23 0403 10/29/23 0744 10/29/23 1122  GLUCAP 120* 107* 117* 125* 127*   Lipid Profile: No results for input(s): "CHOL", "HDL", "LDLCALC", "TRIG", "CHOLHDL", "LDLDIRECT" in the last 72 hours. Thyroid  Function Tests: No results for input(s): "TSH", "T4TOTAL", "FREET4", "T3FREE", "THYROIDAB" in the last 72 hours. Anemia Panel: No results for input(s): "VITAMINB12", "FOLATE", "FERRITIN", "TIBC", "IRON", "RETICCTPCT" in the last 72 hours. Sepsis Labs: No results for input(s): "PROCALCITON", "LATICACIDVEN" in the last 168 hours.   Recent Results (from the past 240 hours)  Culture, blood (x 2)     Status: None   Collection Time: 10/22/23  1:00 AM   Specimen: BLOOD  Result Value Ref Range Status   Specimen Description BLOOD BLOOD RIGHT ARM  Final   Special Requests   Final    BOTTLES DRAWN AEROBIC AND ANAEROBIC Blood Culture adequate volume   Culture   Final    NO GROWTH 5 DAYS Performed at [facility], [address], [place], [place] [address]    Report Status 10/27/2023 FINAL  Final  Culture, blood (x 2)     Status: None   Collection Time: 10/22/23  1:00 AM   Specimen: BLOOD  Result Value Ref Range Status   Specimen  Description BLOOD BLOOD RIGHT ARM  Final   Special Requests   Final    BOTTLES DRAWN AEROBIC AND ANAEROBIC Blood Culture adequate volume   Culture   Final    NO GROWTH 5 DAYS Performed at [facility], [address], [place], [place] [address]    Report Status 10/27/2023 FINAL  Final         Radiology Studies: [facility] Chest Port 1 View Result Date: 10/28/2023 CLINICAL DATA:  Shortness of breath EXAM: PORTABLE CHEST 1 VIEW COMPARISON:  Oct 12, 2023 FINDINGS: Mild prominence of the interstitial markings may correlate with subtle chronic interstitial lung disease, superimposed congestive changes not excluded., comparison with prior examination there is some interval improvement in the bilateral previously described pulmonary infiltrates. IMPRESSION:  Mild prominence of the interstitial markings may correlate with subtle chronic interstitial lung disease, superimposed congestive changes not excluded. Electronically Signed   By: Fredrich Jefferson M.D.   On: 10/28/2023 09:03         Scheduled Meds:  Chlorhexidine  Gluconate Cloth  6 each Topical Q0600   feeding supplement (PROSource TF20)  60 mL Per Tube Daily   free water   100 mL Per Tube Q4H   gentamicin  ointment   Topical TID   insulin  aspart  0-9 Units Subcutaneous Q4H   mouth rinse  15 mL Mouth Rinse 4 times per day   pantoprazole  (PROTONIX ) IV  40 mg Intravenous Q24H   QUEtiapine   25 mg Per Tube QHS   Continuous Infusions:  feeding supplement (OSMOLITE 1.5 CAL) 1,000 mL (10/29/23 0836)   lactated ringers  40 mL/hr at 10/29/23 0918     LOS: 24 days      Tiajuana Fluke, MD [facility]   If 7PM-7AM, please contact night-coverage  10/29/2023, 5:37 PM

## 2023-10-29 NOTE — Plan of Care (Signed)
  Problem: Education: Goal: Knowledge of General Education information will improve Description: Including pain rating scale, medication(s)/side effects and non-pharmacologic comfort measures Outcome: Progressing   Problem: Health Behavior/Discharge Planning: Goal: Ability to manage health-related needs will improve Outcome: Progressing   Problem: Clinical Measurements: Goal: Ability to maintain clinical measurements within normal limits will improve Outcome: Progressing   Problem: Clinical Measurements: Goal: Will remain free from infection Outcome: Progressing   Problem: Clinical Measurements: Goal: Diagnostic test results will improve Outcome: Progressing   Problem: Clinical Measurements: Goal: Respiratory complications will improve Outcome: Progressing   Problem: Clinical Measurements: Goal: Cardiovascular complication will be avoided Outcome: Progressing   Problem: Activity: Goal: Risk for activity intolerance will decrease Outcome: Progressing   Problem: Nutrition: Goal: Adequate nutrition will be maintained Outcome: Progressing   Problem: Pain Managment: Goal: General experience of comfort will improve and/or be controlled Outcome: Progressing   Problem: Skin Integrity: Goal: Risk for impaired skin integrity will decrease Outcome: Progressing   Problem: Safety: Goal: Ability to remain free from injury will improve Outcome: Progressing   Problem: Activity: Goal: Ability to tolerate increased activity will improve Outcome: Progressing   Problem: Clinical Measurements: Goal: Ability to maintain a body temperature in the normal range will improve Outcome: Progressing   Problem: Respiratory: Goal: Ability to maintain adequate ventilation will improve Outcome: Progressing   Problem: Respiratory: Goal: Ability to maintain a clear airway will improve Outcome: Progressing   Problem: Metabolic: Goal: Ability to maintain appropriate glucose levels will  improve Outcome: Progressing   Plan of care ongoing, see MAR see flowsheet

## 2023-10-29 NOTE — Progress Notes (Signed)
 Physical Therapy Treatment Patient Details Name: Robert Vega MRN: [id number] DOB: 1940/11/03 Today's Date: 10/29/2023   History of Present Illness 83 y.o male with significant PMH of OSA, GERD, Obesity, HTN, Dysphagia: EGD 03/2022 with food in upper esophagus complicated by aspiration event, cardiac arrest with round of CPR, and post resuscitation EGD with concern for lack of peristalsis. Pt presented to ED on 10/05/2023 with hypoxia, fever and generalized weakness; developed acute respiratory failure requiring intubation 5/10 due to aspiration pneumonia, extubated 5/15, but had significant agitation required brief course of Precedex ; pt now s/p exploratory laparotomy with closure of gastrotomy and insertion of gastrostomy tube on 10/22/23    PT Comments  Pt is making limited progress towards goals. Pt remains confused, unable to recognize family members at bedside. Agreeable to session this date with prompting from PT. Upon removing covers, pt noted to be soiled, needing complete bed change and 2 assist for hygiene. Pt becomes resistant during rolling to B sides and needs constant cues for attention to task. Pt then able to sit at EOB in addition to limited OOB mobility. Sitter at bedside. Will continue to progress.  Updated tier level.   If plan is discharge home, recommend the following: Two people to help with walking and/or transfers;Two people to help with bathing/dressing/bathroom;Supervision due to cognitive status   Can travel by private vehicle     No  Equipment Recommendations   (TBD)    Recommendations for Other Services       Precautions / Restrictions Precautions Precautions: Fall Recall of Precautions/Restrictions: Impaired Precaution/Restrictions Comments: abdominal binder donned. Restrictions Weight Bearing Restrictions Per Provider Order: No     Mobility  Bed Mobility Overal bed mobility: Needs Assistance Bed Mobility: Supine to Sit, Sit to Supine Rolling: Max  assist, +2 for physical assistance   Supine to sit: Total assist Sit to supine: Total assist   General bed mobility comments: +2 assist for all mobility with poor initiation of tasks. Pt noted to be soiled upon arrival. Multiple bouts of rolling performed. Once seated at EOB, needs constant min assist to maintain upright posture in addition to cues for uncrossing feet to improve BOS.    Transfers Overall transfer level: Needs assistance Equipment used: 2 person hand held assist Transfers: Sit to/from Stand Sit to Stand: Max assist, +2 physical assistance, From elevated surface           General transfer comment: able to stand for around 1-2 minutes with +2 assist and slightly elevated surface. Cues for attempting side stepping up towards HOB to assist for repositioning. 1 side step prior to pt with uncontrolled decent back to sitting at EOB.    Ambulation/Gait               General Gait Details: not safe at this time   Stairs             Wheelchair Mobility     Tilt Bed    Modified Rankin (Stroke Patients Only)       Balance Overall balance assessment: Needs assistance Sitting-balance support: Feet supported, Bilateral upper extremity supported Sitting balance-Leahy Scale: Fair     Standing balance support: Bilateral upper extremity supported Standing balance-Leahy Scale: Poor                              Communication Communication Communication: Impaired Factors Affecting Communication: Reduced clarity of speech  Cognition Arousal: Lethargic, Suspect due to medications  Behavior During Therapy: Impulsive   PT - Cognitive impairments: History of cognitive impairments                       PT - Cognition Comments: when not stimulated, has trouble maintaining alertness. IMpulsive and at times restless. Mitts donned Following commands: Impaired Following commands impaired: Follows one step commands with increased time     Cueing Cueing Techniques: Verbal cues, Tactile cues  Exercises Other Exercises Other Exercises: pt noted to be soiled in bed upon arrival. Multiple bouts of rolling to assist with hygiene. Poor ability to follow commands. Whole bed change performed Other Exercises: attempted seated ther-ex including LAQ and hip abd/add. Pt unable to participate and follow commands for active strengthening    General Comments        Pertinent Vitals/Pain Pain Assessment Pain Assessment: No/denies pain    Home Living                          Prior Function            PT Goals (current goals can now be found in the care plan section) Acute Rehab PT Goals Patient Stated Goal: none stated PT Goal Formulation: Patient unable to participate in goal setting Time For Goal Achievement: 11/06/23 Potential to Achieve Goals: Fair Progress towards PT goals: Progressing toward goals    Frequency    Min 2X/week      PT Plan      Co-evaluation              AM-PAC PT "6 Clicks" Mobility   Outcome Measure  Help needed turning from your back to your side while in a flat bed without using bedrails?: A Lot Help needed moving from lying on your back to sitting on the side of a flat bed without using bedrails?: Total Help needed moving to and from a bed to a chair (including a wheelchair)?: Total Help needed standing up from a chair using your arms (e.g., wheelchair or bedside chair)?: A Lot Help needed to walk in hospital room?: A Lot Help needed climbing 3-5 steps with a railing? : Total 6 Click Score: 9    End of Session Equipment Utilized During Treatment: Oxygen Activity Tolerance: Patient limited by lethargy Patient left: in bed;with bed alarm set (sitter at bedside covering A/B bed) Nurse Communication: Mobility status PT Visit Diagnosis: Unsteadiness on feet (R26.81);Repeated falls (R29.6);Muscle weakness (generalized) (M62.81);Difficulty in walking, not elsewhere classified  (R26.2);Other abnormalities of gait and mobility (R26.89);Pain     Time: 1123-1201 PT Time Calculation (min) (ACUTE ONLY): 38 min  Charges:    $Therapeutic Exercise: 8-22 mins $Therapeutic Activity: 23-37 mins PT General Charges $$ ACUTE PT VISIT: 1 Visit                     Amparo Balk, PT, DPT, GCS [phone]    Robert Vega 10/29/2023, 2:42 PM

## 2023-10-29 NOTE — Progress Notes (Signed)
 Occupational Therapy Treatment Patient Details Name: Robert Vega MRN: [id number] DOB: 05/07/1941 Today's Date: 10/29/2023   History of present illness 83 y.o male with significant PMH of OSA, GERD, Obesity, HTN, Dysphagia: EGD 03/2022 with food in upper esophagus complicated by aspiration event, cardiac arrest with round of CPR, and post resuscitation EGD with concern for lack of peristalsis. Pt presented to ED on 10/05/2023 with hypoxia, fever and generalized weakness; developed acute respiratory failure requiring intubation 5/10 due to aspiration pneumonia, extubated 5/15, but had significant agitation required brief course of Precedex ; pt now s/p exploratory laparotomy with closure of gastrotomy and insertion of gastrostomy tube on 10/22/23   OT comments  Pt is supine in bed on arrival. Easily arousable and agreeable to OT session. He does not appear to have pain. Pt performed bed mobility with total/Max A x2 for BLE management using chux pad and full assist to bring trunk upright with pt resisting/pushing back initially needing Max A for seated balance progressing to CGA. NT in to assist as 2nd person for standing and peri-care/linen change. Pt continues to need max multimodal cueing for all tasks. Max A x2 for 2 STS trials during session with 1-2 min standing tolerance for peri-care and linen change to be performed after BM. Able to take 1 lateral step to [facility] on 2nd trial via HHA. Pt with more difficulty following 1 step commands this date, dancing when standing up at EOB and making car noises with return to supine. Max A x2 to scoot to [facility]. Sitter in room and pt left with all needs in place and will cont to require skilled acute OT services to maximize his safety and IND to return to PLOF.       If plan is discharge home, recommend the following:  Assistance with cooking/housework;Help with stairs or ramp for entrance;Supervision due to cognitive status;A lot of help with walking and/or  transfers;A lot of help with bathing/dressing/bathroom   Equipment Recommendations  Other (comment) (defer)    Recommendations for Other Services      Precautions / Restrictions Precautions Precautions: Fall Recall of Precautions/Restrictions: Impaired Precaution/Restrictions Comments: abdominal binder donned. Restrictions Weight Bearing Restrictions Per Provider Order: No       Mobility Bed Mobility Overal bed mobility: Needs Assistance Bed Mobility: Supine to Sit, Sit to Supine     Supine to sit: Total assist, Max assist, Used rails, HOB elevated Sit to supine: Total assist, Max assist   General bed mobility comments: d/t pt lethargy and confusion, needed total/max A to reach EOB and return to supine at end of sesssion; unable to initiate or reach for bed rails appropriately; initially with posterior lean with EOB sitting, but progressed to CGA, cues to uncross his legs/feet    Transfers Overall transfer level: Needs assistance Equipment used: 2 person hand held assist Transfers: Sit to/from Stand Sit to Stand: Max assist, +2 physical assistance, From elevated surface, Mod assist           General transfer comment: increased assist with bed height elevated to stand from EOB this date-NT present to assist d/t pt confusion and need for max multimodal cueing;x2 STS from EOB with 1-2 min tolerance for peri-care then able to take 1 lateral step to [facility] during 2nd standing trial     Balance Overall balance assessment: Needs assistance Sitting-balance support: Feet supported, Bilateral upper extremity supported Sitting balance-Leahy Scale: Fair Sitting balance - Comments: initally Max A d/t posterior lean progressing to CGA for safety  Standing balance support: Bilateral upper extremity supported Standing balance-Leahy Scale: Poor Standing balance comment: Mod/Max A x2 for STS via HHA                           ADL either performed or assessed with clinical  judgement   ADL Overall ADL's : Needs assistance/impaired                             Toileting- Clothing Manipulation and Hygiene: Maximal assistance;Sit to/from stand Toileting - Clothing Manipulation Details (indicate cue type and reason): peri-care in standing at [facility]            Extremity/Trunk Assessment              Vision       Perception     Praxis     Communication Communication Communication: Impaired Factors Affecting Communication: Reduced clarity of speech   Cognition Arousal: Lethargic, Suspect due to medications Behavior During Therapy: Impulsive                                 Following commands: Impaired Following commands impaired: Follows one step commands with increased time      Cueing   Cueing Techniques: Verbal cues, Tactile cues  Exercises      Shoulder Instructions       General Comments      Pertinent Vitals/ Pain       Pain Assessment Pain Assessment: No/denies pain Pain Intervention(s): Monitored during session  Home Living                                          Prior Functioning/Environment              Frequency  Min 2X/week        Progress Toward Goals  OT Goals(current goals can now be found in the care plan section)  Progress towards OT goals: Progressing toward goals  Acute Rehab OT Goals Patient Stated Goal: get better OT Goal Formulation: Patient unable to participate in goal setting Time For Goal Achievement: 11/06/23 Potential to Achieve Goals: Fair  Plan      Co-evaluation                 AM-PAC OT "6 Clicks" Daily Activity     Outcome Measure   Help from another person eating meals?: Total (feeding tube) Help from another person taking care of personal grooming?: A Little Help from another person toileting, which includes using toliet, bedpan, or urinal?: A Lot Help from another person bathing (including washing, rinsing, drying)?: A  Lot Help from another person to put on and taking off regular upper body clothing?: A Little Help from another person to put on and taking off regular lower body clothing?: A Lot 6 Click Score: 13    End of Session Equipment Utilized During Treatment: Gait belt  OT Visit Diagnosis: Unsteadiness on feet (R26.81);Repeated falls (R29.6);Muscle weakness (generalized) (M62.81)   Activity Tolerance Patient tolerated treatment well;Other (comment) (limited by cognition)   Patient Left in bed;with call bell/phone within reach;with bed alarm set;with nursing/sitter in room   Nurse Communication Mobility status        Time: 1427-1450 OT Time Calculation (min):  23 min  Charges: OT General Charges $OT Visit: 1 Visit OT Treatments $Therapeutic Activity: 23-37 mins  Robert Vega, OTR/L  10/29/23, 4:20 PM   Robert Vega 10/29/2023, 4:17 PM

## 2023-10-30 DIAGNOSIS — E440 Moderate protein-calorie malnutrition: Secondary | ICD-10-CM | POA: Insufficient documentation

## 2023-10-30 DIAGNOSIS — A419 Sepsis, unspecified organism: Secondary | ICD-10-CM | POA: Diagnosis not present

## 2023-10-30 DIAGNOSIS — R6520 Severe sepsis without septic shock: Secondary | ICD-10-CM | POA: Diagnosis not present

## 2023-10-30 LAB — BASIC METABOLIC PANEL WITH GFR
Anion gap: 7 (ref 5–15)
BUN: 42 mg/dL — ABNORMAL HIGH (ref 8–23)
CO2: 27 mmol/L (ref 22–32)
Calcium: 7.7 mg/dL — ABNORMAL LOW (ref 8.9–10.3)
Chloride: 111 mmol/L (ref 98–111)
Creatinine, Ser: 1.97 mg/dL — ABNORMAL HIGH (ref 0.61–1.24)
GFR, Estimated: 33 mL/min — ABNORMAL LOW (ref >60)
Glucose, Bld: 125 mg/dL — ABNORMAL HIGH (ref 70–99)
Potassium: 4.1 mmol/L (ref 3.5–5.1)
Sodium: 145 mmol/L (ref 135–145)

## 2023-10-30 LAB — CBC WITH DIFFERENTIAL/PLATELET
Abs Immature Granulocytes: 0.33 10*3/uL — ABNORMAL HIGH (ref 0.00–0.07)
Basophils Absolute: 0.1 10*3/uL (ref 0.0–0.1)
Basophils Relative: 0 %
Eosinophils Absolute: 0.1 10*3/uL (ref 0.0–0.5)
Eosinophils Relative: 1 %
HCT: 30.3 % — ABNORMAL LOW (ref 39.0–52.0)
Hemoglobin: 9.9 g/dL — ABNORMAL LOW (ref 13.0–17.0)
Immature Granulocytes: 2 %
Lymphocytes Relative: 9 %
Lymphs Abs: 1.2 10*3/uL (ref 0.7–4.0)
MCH: 31.9 pg (ref 26.0–34.0)
MCHC: 32.7 g/dL (ref 30.0–36.0)
MCV: 97.7 fL (ref 80.0–100.0)
Monocytes Absolute: 1.7 10*3/uL — ABNORMAL HIGH (ref 0.1–1.0)
Monocytes Relative: 13 %
Neutro Abs: 10.1 10*3/uL — ABNORMAL HIGH (ref 1.7–7.7)
Neutrophils Relative %: 75 %
Platelets: 256 10*3/uL (ref 150–400)
RBC: 3.1 MIL/uL — ABNORMAL LOW (ref 4.22–5.81)
RDW: 13.3 % (ref 11.5–15.5)
WBC: 13.5 10*3/uL — ABNORMAL HIGH (ref 4.0–10.5)
nRBC: 0 % (ref 0.0–0.2)

## 2023-10-30 LAB — GLUCOSE, CAPILLARY
Glucose-Capillary: 145 mg/dL — ABNORMAL HIGH (ref 70–99)
Glucose-Capillary: 160 mg/dL — ABNORMAL HIGH (ref 70–99)
Glucose-Capillary: 136 mg/dL — ABNORMAL HIGH (ref 70–99)
Glucose-Capillary: 128 mg/dL — ABNORMAL HIGH (ref 70–99)
Glucose-Capillary: 124 mg/dL — ABNORMAL HIGH (ref 70–99)
Glucose-Capillary: 117 mg/dL — ABNORMAL HIGH (ref 70–99)

## 2023-10-30 MED ORDER — ENOXAPARIN SODIUM 40 MG/0.4ML IJ SOSY
40.0000 mg | PREFILLED_SYRINGE | INTRAMUSCULAR | Status: DC
  Administered 2023-10-30 – 2023-11-03 (×5): 40 mg via SUBCUTANEOUS
  Filled 2023-10-30 (×5): qty 0.4

## 2023-10-30 MED ORDER — FREE WATER
150.0000 mL | Status: DC
  Administered 2023-10-30 – 2023-10-31 (×5): 150 mL

## 2023-10-30 MED ORDER — PROSOURCE TF20 ENFIT COMPATIBL EN LIQD
60.0000 mL | Freq: Two times a day (BID) | ENTERAL | Status: DC
  Administered 2023-10-30 – 2023-11-08 (×17): 60 mL
  Filled 2023-10-30 (×15): qty 60

## 2023-10-30 NOTE — Progress Notes (Signed)
 Nutrition Follow-up  DOCUMENTATION CODES:   Non-severe (moderate) malnutrition in context of acute illness/injury  INTERVENTION:   -Continue TF via PEG:    Osmolite 1.5 @ 60 ml/hr   60 ml Prosource TF20 BID   100 ml free water  flush every 4 hours     Tube feeding regimen provides 2320 kcal (100% of needs), 130 grams of protein, and 1097 ml of H2O. Total free water : 1697 ml daily  NUTRITION DIAGNOSIS:   Moderate Malnutrition related to acute illness as evidenced by mild fat depletion, mild muscle depletion, moderate muscle depletion, percent weight loss.  Ongoing  GOAL:   Patient will meet greater than or equal to 90% of their needs  Met with TF  MONITOR:   Diet advancement, Labs, Weight trends, TF tolerance, I & O's, Skin  REASON FOR ASSESSMENT:   Consult Assessment of nutrition requirement/status  ASSESSMENT:   83 y/o male with h/o GERD, esophageal dysphagia, HTN, BPH, mood disorder and hiatal hernia who is admitted with aspiration event, PNA, sepsis, cardiac arrest and dysphagia.  5/13- s/p EGD- found to have food in the entire esophagus and gastric polyps. PEG tube placed  5/14- extubated 5/15- TF re-started 5/20- s/p EGD- esophageal dilation with botox  injection 5/22- advanced to clear liquid diet 5/24- pt pulled out PEG 5/25- PEG replaced by GI 5/26- KUB revealed some stool in the rectum and paucity of air in the colon, CT revealed G-tube not in the stomach, has spillage of contrast into the peritoneal cavity and moderate amount of pneumoperitoneum, TF d/c  5/27- s/p Exploratory laparotomy, takedown of gastrocutaneous fistula and repair of gastrotomy, placement of gastrostomy tube  5/28- TF resumed  Reviewed I/O's: +1.5 L x 24 hours and -4.7 L since 10/16/23  UOP: 650 ml x 24 hours  Per MD notes, pt has been experiencing agitation, sundowning, and hospital acquired delirium. Kidney function and hypernatremia improving.   Pt lying in bed at time of  visit. Pt acknowledged RD when spoken to, however, speech is very garbled and difficult to understand. Both RN and sitter also noted this change in pt. During prior RD interactions, pt was confused, however, able to communicate with this RD and had clear speech. Pt acknowledges frustration about being in the hospital for a long time. RD provided emotional support.   Per GI notes, no plans to PO intake due to high aspiration intake and end-stage esophageal disease. Pt remains NPO and receiving TF via PEG for sole source nutrition. Osmolite 1.5 infusing at 60 ml/hr via PEG. Pt tolerating well.   Reviewed wt hx; pt has experienced a 6% wt loss over the past week, which is significant for time frame. Highly suspect some wt loss is related to fluid losses (-4.7 L since admission). Pt looks significantly different from last assessment and RD repeated nutrition-focused physical exam.   Per TOC notes, plan for SNF placement at discharge.   Medications reviewed and include protonix .   Labs reviewed: CBGS: 110-160 (inpatient orders for glycemic control are 0-9 units insulin  aspart every 4 hours).    NUTRITION - FOCUSED PHYSICAL EXAM:  Flowsheet Row Most Recent Value  Orbital Region Mild depletion  Upper Arm Region Moderate depletion  Thoracic and Lumbar Region No depletion  Buccal Region Mild depletion  Temple Region Moderate depletion  Clavicle Bone Region Moderate depletion  Clavicle and Acromion Bone Region Moderate depletion  Scapular Bone Region Mild depletion  Dorsal Hand Mild depletion  Patellar Region No depletion  Anterior Thigh Region  No depletion  Posterior Calf Region No depletion  Edema (RD Assessment) Mild  Hair Reviewed  Eyes Reviewed  Mouth Reviewed  Skin Reviewed  Nails Reviewed       Diet Order:   Diet Order             Diet NPO time specified  Diet effective midnight                   EDUCATION NEEDS:   No education needs have been identified at this  time  Skin:  Skin Assessment: Skin Integrity Issues: Skin Integrity Issues:: Other (Comment) Other: skin tear to lt lower arm  Last BM:  10/29/23 (type 7)  Height:   Ht Readings from Last 1 Encounters:  10/08/23 5' 9.02" (1.753 m)    Weight:   Wt Readings from Last 1 Encounters:  10/30/23 97.5 kg    Ideal Body Weight:  72.7 kg  BMI:  Body mass index is 31.73 kg/m.  Estimated Nutritional Needs:   Kcal:  2150-2350  Protein:  105-130 grams  Fluid:  2-2.2 L    Herschel Lords, RD, LDN, CDCES Registered Dietitian III Certified Diabetes Care and Education Specialist If unable to reach this RD, please use "RD Inpatient" group chat on secure chat between hours of 8am-4 pm daily

## 2023-10-30 NOTE — Plan of Care (Signed)
  Problem: Education: Goal: Knowledge of General Education information will improve Description: Including pain rating scale, medication(s)/side effects and non-pharmacologic comfort measures Outcome: Progressing   Problem: Health Behavior/Discharge Planning: Goal: Ability to manage health-related needs will improve Outcome: Progressing   Problem: Clinical Measurements: Goal: Ability to maintain clinical measurements within normal limits will improve Outcome: Progressing Goal: Will remain free from infection Outcome: Progressing Goal: Diagnostic test results will improve Outcome: Progressing Goal: Respiratory complications will improve Outcome: Progressing Goal: Cardiovascular complication will be avoided Outcome: Progressing   Problem: Activity: Goal: Risk for activity intolerance will decrease Outcome: Progressing   Problem: Nutrition: Goal: Adequate nutrition will be maintained Outcome: Progressing   Problem: Coping: Goal: Level of anxiety will decrease Outcome: Progressing   Problem: Elimination: Goal: Will not experience complications related to bowel motility Outcome: Progressing Goal: Will not experience complications related to urinary retention Outcome: Progressing   Problem: Pain Managment: Goal: General experience of comfort will improve and/or be controlled Outcome: Progressing   Problem: Safety: Goal: Ability to remain free from injury will improve Outcome: Progressing   Problem: Skin Integrity: Goal: Risk for impaired skin integrity will decrease Outcome: Progressing   Problem: Activity: Goal: Ability to tolerate increased activity will improve Outcome: Progressing   Problem: Clinical Measurements: Goal: Ability to maintain a body temperature in the normal range will improve Outcome: Progressing   Problem: Respiratory: Goal: Ability to maintain adequate ventilation will improve Outcome: Progressing Goal: Ability to maintain a clear airway  will improve Outcome: Progressing   Problem: Education: Goal: Ability to describe self-care measures that may prevent or decrease complications (Diabetes Survival Skills Education) will improve Outcome: Progressing Goal: Individualized Educational Video(s) Outcome: Progressing   Problem: Coping: Goal: Ability to adjust to condition or change in health will improve Outcome: Progressing   Problem: Fluid Volume: Goal: Ability to maintain a balanced intake and output will improve Outcome: Progressing   Problem: Health Behavior/Discharge Planning: Goal: Ability to identify and utilize available resources and services will improve Outcome: Progressing Goal: Ability to manage health-related needs will improve Outcome: Progressing   Problem: Metabolic: Goal: Ability to maintain appropriate glucose levels will improve Outcome: Progressing   Problem: Nutritional: Goal: Maintenance of adequate nutrition will improve Outcome: Progressing Goal: Progress toward achieving an optimal weight will improve Outcome: Progressing   Problem: Skin Integrity: Goal: Risk for impaired skin integrity will decrease Outcome: Progressing   Problem: Tissue Perfusion: Goal: Adequacy of tissue perfusion will improve Outcome: Progressing   Problem: Activity: Goal: Ability to tolerate increased activity will improve Outcome: Progressing   Problem: Respiratory: Goal: Ability to maintain a clear airway and adequate ventilation will improve Outcome: Progressing   Problem: Role Relationship: Goal: Method of communication will improve Outcome: Progressing   Problem: Fluid Volume: Goal: Hemodynamic stability will improve Outcome: Progressing   Problem: Clinical Measurements: Goal: Diagnostic test results will improve Outcome: Progressing Goal: Signs and symptoms of infection will decrease Outcome: Progressing   Problem: Respiratory: Goal: Ability to maintain adequate ventilation will  improve Outcome: Progressing

## 2023-10-30 NOTE — Progress Notes (Signed)
 Physical Therapy Treatment Patient Details Name: Robert Vega MRN: [id number] DOB: Oct 04, 1940 Today's Date: 10/30/2023   History of Present Illness 83 y.o male with significant PMH of OSA, GERD, Obesity, HTN, Dysphagia: EGD 03/2022 with food in upper esophagus complicated by aspiration event, cardiac arrest with round of CPR, and post resuscitation EGD with concern for lack of peristalsis. Pt presented to ED on 10/05/2023 with hypoxia, fever and generalized weakness; developed acute respiratory failure requiring intubation 5/10 due to aspiration pneumonia, extubated 5/15, but had significant agitation required brief course of Precedex ; pt now s/p exploratory laparotomy with closure of gastrotomy and insertion of gastrostomy tube on 10/22/23    PT Comments  Pt received upright in bed with sitter present. Pt is awake, alert, and more talkative however 90% of speech is unintelligible. Pt is able to follow simple, single step commands with increased time. Pt does initiate supine to sit with hand over hand cues but ultimately needs maxA to sit EOB. Initially with poor sitting tolerance but with hand over hand placement of limbs he is able to maintain static sitting for ~10-15 min. PT resistive and verbally states multiple time he does not want to stand with and without the RW. Attempts made for lateral scooting but pt once again verbally declining and becoming mildly agitated. Pt ultimately agreeable just to sit and then to return to supine minA and TotalA for scooting up in bed. SPO2 on RA ranging 89-93%. Maintains > 90% with VC's for PLB with RN aware. Cognition continues to be a big limiting factor in pt's prognosis in progressing mobility. Pt left in care of sitter and all needs in reach. D/c recs remain appropriate.    If plan is discharge home, recommend the following: Two people to help with walking and/or transfers;Two people to help with bathing/dressing/bathroom;Supervision due to cognitive status    Can travel by private vehicle     No  Equipment Recommendations       Recommendations for Other Services       Precautions / Restrictions Precautions Precautions: Fall Recall of Precautions/Restrictions: Impaired Precaution/Restrictions Comments: abdominal binder donned. Restrictions Weight Bearing Restrictions Per Provider Order: No     Mobility  Bed Mobility Overal bed mobility: Needs Assistance Bed Mobility: Supine to Sit, Sit to Supine     Supine to sit: Max assist, HOB elevated, Used rails Sit to supine: Total assist     Patient Response: Cooperative  Transfers                   General transfer comment: Pt verbally stating "no" with transfers this date. Becomes agitated when attempting to progress mobility.    Ambulation/Gait                   Stairs             Wheelchair Mobility     Tilt Bed Tilt Bed Patient Response: Cooperative  Modified Rankin (Stroke Patients Only)       Balance Overall balance assessment: Needs assistance Sitting-balance support: Feet supported, Bilateral upper extremity supported Sitting balance-Leahy Scale: Fair Sitting balance - Comments: Once seated with UE and LE placement, fair sitting tolerance.     Standing balance-Leahy Scale: Zero                              Communication Communication Communication: Impaired Factors Affecting Communication: Reduced clarity of speech  Cognition Arousal: Alert Behavior  During Therapy: Impulsive   PT - Cognitive impairments: History of cognitive impairments                         Following commands: Impaired Following commands impaired: Follows one step commands with increased time    Cueing Cueing Techniques: Verbal cues, Tactile cues  Exercises Other Exercises Other Exercises: education on importance of OOB mobility.    General Comments        Pertinent Vitals/Pain Pain Assessment Pain Assessment: Faces Faces  Pain Scale: No hurt    Home Living                          Prior Function            PT Goals (current goals can now be found in the care plan section) Acute Rehab PT Goals Patient Stated Goal: none stated PT Goal Formulation: Patient unable to participate in goal setting Time For Goal Achievement: 11/06/23 Potential to Achieve Goals: Fair Progress towards PT goals: Progressing toward goals    Frequency    Min 2X/week      PT Plan      Co-evaluation              AM-PAC PT "6 Clicks" Mobility   Outcome Measure  Help needed turning from your back to your side while in a flat bed without using bedrails?: A Lot Help needed moving from lying on your back to sitting on the side of a flat bed without using bedrails?: Total Help needed moving to and from a bed to a chair (including a wheelchair)?: Total Help needed standing up from a chair using your arms (e.g., wheelchair or bedside chair)?: Total Help needed to walk in hospital room?: Total Help needed climbing 3-5 steps with a railing? : Total 6 Click Score: 7    End of Session Equipment Utilized During Treatment: Gait belt Activity Tolerance: Treatment limited secondary to agitation Patient left: in bed;with call bell/phone within reach;with bed alarm set;with nursing/sitter in room Nurse Communication: Mobility status PT Visit Diagnosis: Unsteadiness on feet (R26.81);Repeated falls (R29.6);Muscle weakness (generalized) (M62.81);Difficulty in walking, not elsewhere classified (R26.2);Other abnormalities of gait and mobility (R26.89);Pain     Time: 1342-1406 PT Time Calculation (min) (ACUTE ONLY): 24 min  Charges:    $Therapeutic Activity: 23-37 mins PT General Charges $$ ACUTE PT VISIT: 1 Visit                     Marc Senior. Fairly IV, PT, DPT Physical Therapist- [place]  [facility] 10/30/2023, 3:50 PM

## 2023-10-30 NOTE — TOC Progression Note (Signed)
 Transition of Care [facility]) - Progression Note    Patient Details  Name: Robert Vega MRN: [id number] Date of Birth: 11/28/1940  Transition of Care [facility]) CM/SW Contact  Odilia Bennett, LCSW Phone Number: 10/30/2023, 9:47 AM  Clinical Narrative:   CSW called granddaughter. She is working with the family to decide which SNF they want to accept the offer from. She is hoping to have her decision by tomorrow or Friday.  Expected Discharge Plan and Services                                               Social Determinants of Health (SDOH) Interventions SDOH Screenings   Food Insecurity: Patient Unable To Answer (10/06/2023)  Recent Concern: Food Insecurity - Food Insecurity Present (09/11/2023)   Received from [facility]  Housing: Patient Unable To Answer (10/06/2023)  Recent Concern: Housing - High Risk (09/11/2023)   Received from [facility]  Transportation Needs: Patient Unable To Answer (10/06/2023)  Utilities: Patient Unable To Answer (10/06/2023)  Depression (PHQ2-9): Low Risk  (05/07/2023)  Financial Resource Strain: Medium Risk (09/11/2023)   Received from [facility]  Social Connections: Unknown (10/06/2023)  Tobacco Use: Medium Risk (10/22/2023)    Readmission Risk Interventions     No data to display

## 2023-10-30 NOTE — Progress Notes (Addendum)
 PROGRESS NOTE    Robert Vega  XLK:[id number] DOB: 08/13/1940 DOA: 10/05/2023 PCP: Helaine Llanos, MD    Brief Narrative:  83 y.o male with significant PMH of OSA, GERD, Obesity, HTN, Dysphagia: EGD 03/2022 with food in upper esophagus complicated by aspiration event, cardiac arrest with round of CPR, and post resuscitation EGD with concern for lack of peristalsis. Prior EGD 08/2020 with note of abnormal cricopharyngeus, decrease in motility in esophagus, and spastic LES who presented to the ED from with hypoxia, fever and generalized weakness.  Patient developed acute respiratory failure requiring intubation due to aspiration pneumonia, he was treated with broad-spectrum antibiotics and managed in ICU. Condition had improved, extubated 5/15, but has significant agitation required brief course of Precedex . 5/10: Admit to [facility] service with sepsis due to Aspiration Pneumonia.  Course complicated by Acute Respiratory Failure due to Aspiration of vomitus requiring s/p cardiac arrest  transfer to ICU and intubation.  5/11 flexible bronchoscopy done by critical care team 5/12 remains intubated 5/13 remains on vent, s/p PEG tube, +vomiting last night, failed SAT/SBT 5/14 extubated successfully but with severe delirium 5/20.  Botulinum toxin injection into the lower esophageal sphincter by Dr. Ole Berkeley   5/21 esophagram showing diffusely distended esophagus with distal obstruction and severe dysmotility suggestive of achalasia. 5/22.  Advised I would not give him any food and will continue PEG feeding he can try liquids to see if that goes down but still high risk for aspiration.  Patient had a fall on the evening of 5/22. 5/23.  Patient feels okay.  Awaken from sleep.  Did not offer any complaints.  Awaiting insurance authorization for rehab. 5/24.  Patient feeling okay.  States he is doing okay with the liquid diet.  Still feels weak. 5/25.  Patient pulled out PEG tube this morning.  Foley catheter  placed in the stoma.  GI able to place a another PEG tube and remove the Foley. 5/26.  Creatinine up at 1.83.  Continue IV fluid hydration.  Patient started having pain after tube feeding started.  CT scan showed that the PEG tube was not in the correct place.  Notified gastroenterology.  Started empiric antibiotics. 5/27.  Patient was taken urgently to the operating room by Dr. Cornel Diesel because the patient had fever and elevated white count with suspected intra-abdominal sepsis. 5/29: Agitation/sundowning noted around shift change 5/28.  Suspect hospital-acquired delirium.  Received doses of atypical antipsychotics.  Much more calm, awake, alert in AM. 6/1: Foley discontinued yesterday.  Has some AKI.  Has been urine.  Mental status improved. 6/2: Kidney function improving, hypernatremia improving 6/3: Lethargy/confusion noted by PT today  Assessment & Plan:   Principal Problem:   Severe sepsis (HCC) Active Problems:   Gastrostomy complication (HCC)   Dysphagia   Achalasia of esophagus   AKI (acute kidney injury) (HCC)   Acute delirium   Acute respiratory failure with hypoxia and hypercapnia (HCC)   Essential hypertension, benign   GERD (gastroesophageal reflux disease)   Multifocal pneumonia   History of dysphagia   Aspiration pneumonia (HCC)   Obesity (BMI 30-39.9)   Generalized weakness   Generalized abdominal pain   Malnutrition of moderate degree   Severe sepsis (HCC) Recurrence with different problem.  Intra-abdominal infection secondary to PEG tube not being in the right place.  Elevated white count and fever and acute kidney injury.  Patient brought emergently to the operating room on 5/27 by Dr. Cornel Diesel.   Plan: Continue tube feeds N.p.o. for  the foreseeable future Continue IV Zosyn  for now, can de-escalate to oral regimen if patient leaves the hospital prior to completion of a total 2-week course of antibiotic Staple removal 6/6 or later  Gastrostomy complication  [facility]) Patient self discontinued PEG tube and malpositioning likely resulted in some intra-abdominal sepsis.  Tube feeds were held.  Restarted 5/28.  Continue at goal rate   AKI (acute kidney injury) (HCC) 100mL q4h free  water  flushes via PEG will increase to 150   Achalasia of esophagus Status post botulinum toxin injection on 5/20.   Esophagram showing dilated esophagus distal obstruction. Per GI esophagram continues to demonstrate severe dysmotility with dilated esophagus.  Consistent with end-stage esophageal disease.  Patient is n.p.o. indefinitely.  Should only be fed via PEG tube considering the risk of aspiration. Will need outpatient referral to tertiary advanced GI for consideration of POEMS procedure.  Recommend this only after a few weeks recovery period.  Discussed recommendations with patient's granddaughter via phone   Dysphagia Likely secondary to achalasia.  Patient had Botox  injection of the lower esophageal sphincter due to lower esophageal sphincter hypertension causing the patient not being able to swallow and empty the esophagus of his ingested food.  Food was in the esophagus on the previous EGD.  NPO for forseeable future  Acute delirium Agitation/sundowning Plan: Continue as needed Haldol .  Granddaughter asks to remove prn antipsychotics   Acute respiratory failure with hypoxia and hypercapnia (HCC) Pneumonia Now resolved   Essential hypertension, benign Hold Norvasc    GERD (gastroesophageal reflux disease) Continue PPI   Generalized weakness Therapy evaluations as tolerated Will benefit from placement to skilled nursing facility, family is deciding on a facility   Obesity (BMI 30-39.9) BMI 33.16 Complicating factor in overall care and prognosis    DVT prophylaxis: lovenox  Code Status: Full Family Communication:  granddaughter brittany updated telephonically 6/4 Disposition Plan: Status is: Inpatient Remains inpatient appropriate because: Multiple  acute issues as above   Level of care: Progressive  Consultants:  General surgery  Procedures:  Ex lap  Antimicrobials: Zosyn    Subjective: Seen and examined. No acute events. Remains confused  Objective: Vitals:   10/30/23 0500 10/30/23 0802 10/30/23 1114 10/30/23 1642  BP:  119/63 121/64 109/61  Pulse:  86 89 89  Resp:      Temp:  98.1 F (36.7 C) (!) 97.5 F (36.4 C) (!) 97.4 F (36.3 C)  TempSrc:      SpO2:  93% 95% 90%  Weight: 97.5 kg     Height:        Intake/Output Summary (Last 24 hours) at 10/30/2023 1701 Last data filed at 10/30/2023 0516 Gross per 24 hour  Intake 2189.78 ml  Output 650 ml  Net 1539.78 ml   Filed Weights   10/27/23 0500 10/29/23 0500 10/30/23 0500  Weight: 98.4 kg 98.9 kg 97.5 kg    Examination:  General exam: Appears fatigued Respiratory system: Coarse breath sounds, normal WOB, RA Cardiovascular system: S1-S2, RRR  Gastrointestinal system: Soft, NT ND, + PEG, abdominal binder in place Central nervous system: Alert and oriented to self Extremities: warm, no edema  Skin: No visible rashes, lesions or ulcers Psychiatry: Judgement and insight appear impaired. Mood & affect confused.     Data Reviewed: I have personally reviewed following labs and imaging studies  CBC: Recent Labs  Lab 10/24/23 0519 10/28/23 0408 10/29/23 0644 10/30/23 0518  WBC 8.1 7.7 10.6* 13.5*  NEUTROABS  --  5.1 7.6 10.1*  HGB 8.8*  9.9* 10.6* 9.9*  HCT 25.6* 30.0* 32.0* 30.3*  MCV 94.1 95.8 97.0 97.7  PLT 267 314 314 256   Basic Metabolic Panel: Recent Labs  Lab 10/25/23 0600 10/26/23 0449 10/27/23 0452 10/28/23 0408 10/29/23 0644 10/30/23 0518  NA 143 146* 147* 146* 144 145  K 3.2* 3.7 3.6 3.8 4.4 4.1  CL 107 112* 112* 109 108 111  CO2 28 26 27 27 26 27   GLUCOSE 124* 140* 131* 116* 122* 125*  BUN 20 19 22  26* 36* 42*  CREATININE 1.62* 1.80* 2.01* 1.83* 2.15* 1.97*  CALCIUM 8.0* 7.9* 7.7* 7.9* 7.8* 7.7*  MG 2.2 2.2 2.3  --   --   --    PHOS 3.3 4.1 4.0  --   --   --    GFR: Estimated Creatinine Clearance: 32.1 mL/min (A) (by C-G formula based on SCr of 1.97 mg/dL (H)). Liver Function Tests: No results for input(s): "AST", "ALT", "ALKPHOS", "BILITOT", "PROT", "ALBUMIN" in the last 168 hours.  No results for input(s): "LIPASE", "AMYLASE" in the last 168 hours. No results for input(s): "AMMONIA" in the last 168 hours. Coagulation Profile: No results for input(s): "INR", "PROTIME" in the last 168 hours.  Cardiac Enzymes: No results for input(s): "CKTOTAL", "CKMB", "CKMBINDEX", "TROPONINI" in the last 168 hours. BNP (last 3 results) No results for input(s): "PROBNP" in the last 8760 hours. HbA1C: No results for input(s): "HGBA1C" in the last 72 hours. CBG: Recent Labs  Lab 10/30/23 0103 10/30/23 0400 10/30/23 0805 10/30/23 1318 10/30/23 1602  GLUCAP 145* 160* 136* 128* 124*   Lipid Profile: No results for input(s): "CHOL", "HDL", "LDLCALC", "TRIG", "CHOLHDL", "LDLDIRECT" in the last 72 hours. Thyroid  Function Tests: No results for input(s): "TSH", "T4TOTAL", "FREET4", "T3FREE", "THYROIDAB" in the last 72 hours. Anemia Panel: No results for input(s): "VITAMINB12", "FOLATE", "FERRITIN", "TIBC", "IRON", "RETICCTPCT" in the last 72 hours. Sepsis Labs: No results for input(s): "PROCALCITON", "LATICACIDVEN" in the last 168 hours.   Recent Results (from the past 240 hours)  Culture, blood (x 2)     Status: None   Collection Time: 10/22/23  1:00 AM   Specimen: BLOOD  Result Value Ref Range Status   Specimen Description BLOOD BLOOD RIGHT ARM  Final   Special Requests   Final    BOTTLES DRAWN AEROBIC AND ANAEROBIC Blood Culture adequate volume   Culture   Final    NO GROWTH 5 DAYS Performed at [facility], [address] [place], [place] [address]    Report Status 10/27/2023 FINAL  Final  Culture, blood (x 2)     Status: None   Collection Time: 10/22/23  1:00 AM   Specimen: BLOOD  Result Value  Ref Range Status   Specimen Description BLOOD BLOOD RIGHT ARM  Final   Special Requests   Final    BOTTLES DRAWN AEROBIC AND ANAEROBIC Blood Culture adequate volume   Culture   Final    NO GROWTH 5 DAYS Performed at [facility], [address], [place], [place] [address]    Report Status 10/27/2023 FINAL  Final         Radiology Studies: [facility] Chest Port 1 View Result Date: 10/29/2023 CLINICAL DATA:  Hypoxia EXAM: PORTABLE CHEST 1 VIEW COMPARISON:  Chest x-ray 10/28/2023 FINDINGS: Cardiac silhouette is enlarged, unchanged. The aorta is tortuous. There is central pulmonary vascular congestion. There is no focal lung consolidation, pleural effusion or pneumothorax. No acute fractures are seen. IMPRESSION: Cardiomegaly with central pulmonary vascular congestion.  Electronically Signed   By: Tyron Gallon M.D.   On: 10/29/2023 21:05         Scheduled Meds:  Chlorhexidine  Gluconate Cloth  6 each Topical Q0600   feeding supplement (PROSource TF20)  60 mL Per Tube BID   free water   100 mL Per Tube Q4H   gentamicin  ointment   Topical TID   insulin  aspart  0-9 Units Subcutaneous Q4H   mouth rinse  15 mL Mouth Rinse 4 times per day   pantoprazole  (PROTONIX ) IV  40 mg Intravenous Q24H   QUEtiapine   25 mg Per Tube QHS   Continuous Infusions:  feeding supplement (OSMOLITE 1.5 CAL) 60 mL/hr at 10/30/23 0516     LOS: 25 days      Raymonde Calico, MD [facility]   If 7PM-7AM, please contact night-coverage  10/30/2023, 5:01 PM

## 2023-10-30 NOTE — Progress Notes (Signed)
 Pt unable to follow commands throughout the night. He continues to require 1:1 supervision for safety. Tube feed instilling without complication.

## 2023-10-31 DIAGNOSIS — R6520 Severe sepsis without septic shock: Secondary | ICD-10-CM | POA: Diagnosis not present

## 2023-10-31 DIAGNOSIS — A419 Sepsis, unspecified organism: Secondary | ICD-10-CM | POA: Diagnosis not present

## 2023-10-31 LAB — CBC WITH DIFFERENTIAL/PLATELET
Abs Immature Granulocytes: 0.65 10*3/uL — ABNORMAL HIGH (ref 0.00–0.07)
Basophils Absolute: 0.1 10*3/uL (ref 0.0–0.1)
Basophils Relative: 1 %
Eosinophils Absolute: 0.1 10*3/uL (ref 0.0–0.5)
Eosinophils Relative: 1 %
HCT: 30.1 % — ABNORMAL LOW (ref 39.0–52.0)
Hemoglobin: 9.4 g/dL — ABNORMAL LOW (ref 13.0–17.0)
Immature Granulocytes: 4 %
Lymphocytes Relative: 10 %
Lymphs Abs: 1.6 10*3/uL (ref 0.7–4.0)
MCH: 31.2 pg (ref 26.0–34.0)
MCHC: 31.2 g/dL (ref 30.0–36.0)
MCV: 100 fL (ref 80.0–100.0)
Monocytes Absolute: 2.3 10*3/uL — ABNORMAL HIGH (ref 0.1–1.0)
Monocytes Relative: 14 %
Neutro Abs: 11.9 10*3/uL — ABNORMAL HIGH (ref 1.7–7.7)
Neutrophils Relative %: 70 %
Platelets: 292 10*3/uL (ref 150–400)
RBC: 3.01 MIL/uL — ABNORMAL LOW (ref 4.22–5.81)
RDW: 13.3 % (ref 11.5–15.5)
WBC: 16.7 10*3/uL — ABNORMAL HIGH (ref 4.0–10.5)
nRBC: 0 % (ref 0.0–0.2)

## 2023-10-31 LAB — GLUCOSE, CAPILLARY
Glucose-Capillary: 114 mg/dL — ABNORMAL HIGH (ref 70–99)
Glucose-Capillary: 115 mg/dL — ABNORMAL HIGH (ref 70–99)
Glucose-Capillary: 115 mg/dL — ABNORMAL HIGH (ref 70–99)
Glucose-Capillary: 117 mg/dL — ABNORMAL HIGH (ref 70–99)
Glucose-Capillary: 121 mg/dL — ABNORMAL HIGH (ref 70–99)
Glucose-Capillary: 133 mg/dL — ABNORMAL HIGH (ref 70–99)
Glucose-Capillary: 133 mg/dL — ABNORMAL HIGH (ref 70–99)

## 2023-10-31 LAB — BASIC METABOLIC PANEL WITH GFR
Anion gap: 9 (ref 5–15)
BUN: 45 mg/dL — ABNORMAL HIGH (ref 8–23)
CO2: 26 mmol/L (ref 22–32)
Calcium: 7.7 mg/dL — ABNORMAL LOW (ref 8.9–10.3)
Chloride: 114 mmol/L — ABNORMAL HIGH (ref 98–111)
Creatinine, Ser: 1.66 mg/dL — ABNORMAL HIGH (ref 0.61–1.24)
GFR, Estimated: 40 mL/min — ABNORMAL LOW (ref >60)
Glucose, Bld: 132 mg/dL — ABNORMAL HIGH (ref 70–99)
Potassium: 4.2 mmol/L (ref 3.5–5.1)
Sodium: 149 mmol/L — ABNORMAL HIGH (ref 135–145)

## 2023-10-31 MED ORDER — FREE WATER
250.0000 mL | Status: DC
  Administered 2023-10-31 – 2023-11-01 (×7): 250 mL

## 2023-10-31 MED ORDER — FREE WATER: 200.0000 mL | Status: DC

## 2023-10-31 NOTE — Progress Notes (Signed)
 PROGRESS NOTE    Robert Vega  ZOX:[id number] DOB: Nov 21, 1940 DOA: 10/05/2023 PCP: Helaine Llanos, MD    Brief Narrative:  83 y.o male with significant PMH of OSA, GERD, Obesity, HTN, Dysphagia: EGD 03/2022 with food in upper esophagus complicated by aspiration event, cardiac arrest with round of CPR, and post resuscitation EGD with concern for lack of peristalsis. Prior EGD 08/2020 with note of abnormal cricopharyngeus, decrease in motility in esophagus, and spastic LES who presented to the ED from with hypoxia, fever and generalized weakness.  Patient developed acute respiratory failure requiring intubation due to aspiration pneumonia, he was treated with broad-spectrum antibiotics and managed in ICU. Condition had improved, extubated 5/15, but has significant agitation required brief course of Precedex . 5/10: Admit to [facility] service with sepsis due to Aspiration Pneumonia.  Course complicated by Acute Respiratory Failure due to Aspiration of vomitus requiring s/p cardiac arrest  transfer to ICU and intubation.  5/11 flexible bronchoscopy done by critical care team 5/12 remains intubated 5/13 remains on vent, s/p PEG tube, +vomiting last night, failed SAT/SBT 5/14 extubated successfully but with severe delirium 5/20.  Botulinum toxin injection into the lower esophageal sphincter by Dr. Ole Berkeley   5/21 esophagram showing diffusely distended esophagus with distal obstruction and severe dysmotility suggestive of achalasia. 5/22.  Advised I would not give him any food and will continue PEG feeding he can try liquids to see if that goes down but still high risk for aspiration.  Patient had a fall on the evening of 5/22. 5/23.  Patient feels okay.  Awaken from sleep.  Did not offer any complaints.  Awaiting insurance authorization for rehab. 5/24.  Patient feeling okay.  States he is doing okay with the liquid diet.  Still feels weak. 5/25.  Patient pulled out PEG tube this morning.  Foley catheter  placed in the stoma.  GI able to place a another PEG tube and remove the Foley. 5/26.  Creatinine up at 1.83.  Continue IV fluid hydration.  Patient started having pain after tube feeding started.  CT scan showed that the PEG tube was not in the correct place.  Notified gastroenterology.  Started empiric antibiotics. 5/27.  Patient was taken urgently to the operating room by Dr. Cornel Diesel because the patient had fever and elevated white count with suspected intra-abdominal sepsis. 5/29: Agitation/sundowning noted around shift change 5/28.  Suspect hospital-acquired delirium.  Received doses of atypical antipsychotics.  Much more calm, awake, alert in AM. 6/1: Foley discontinued yesterday.  Has some AKI.  Has been urine.  Mental status improved. 6/2: Kidney function improving, hypernatremia improving 6/3: Lethargy/confusion noted by PT today  Assessment & Plan:   Principal Problem:   Severe sepsis (HCC) Active Problems:   Gastrostomy complication (HCC)   Dysphagia   Achalasia of esophagus   AKI (acute kidney injury) (HCC)   Acute delirium   Acute respiratory failure with hypoxia and hypercapnia (HCC)   Essential hypertension, benign   GERD (gastroesophageal reflux disease)   Multifocal pneumonia   History of dysphagia   Aspiration pneumonia (HCC)   Obesity (BMI 30-39.9)   Generalized weakness   Generalized abdominal pain   Malnutrition of moderate degree   Severe sepsis (HCC) Recurrence with different problem.  Intra-abdominal infection secondary to PEG tube not being in the right place.  Elevated white count and fever and acute kidney injury.  Patient brought emergently to the operating room on 5/27 by Dr. Cornel Diesel.   Plan: Continue tube feeds N.p.o. for  the foreseeable future Continue IV Zosyn  for now, can de-escalate to oral regimen if patient leaves the hospital prior to completion of a total 2-week course of antibiotic Staple removal 6/6 or later  Gastrostomy complication  [facility]) Patient self discontinued PEG tube and malpositioning likely resulted in some intra-abdominal sepsis.  Tube feeds were held.  Restarted 5/28.  Continue at goal rate   AKI (acute kidney injury) (HCC) Resolved  Hypernatremia Will increase free water  flushes from 150 to 250   Achalasia of esophagus Status post botulinum toxin injection on 5/20.   Esophagram showing dilated esophagus distal obstruction. Per GI esophagram continues to demonstrate severe dysmotility with dilated esophagus.  Consistent with end-stage esophageal disease.  Patient is n.p.o. indefinitely.  Should only be fed via PEG tube considering the risk of aspiration. Will need outpatient referral to tertiary advanced GI for consideration of POEMS procedure.  Recommend this only after a few weeks recovery period.  Discussed recommendations with patient's granddaughter via phone   Dysphagia Likely secondary to achalasia.  Patient had Botox  injection of the lower esophageal sphincter due to lower esophageal sphincter hypertension causing the patient not being able to swallow and empty the esophagus of his ingested food.  Food was in the esophagus on the previous EGD.  NPO for forseeable future  Acute delirium Agitation/sundowning Plan: Continue as needed Haldol .  Granddaughter asked to remove prn antipsychotics   Acute respiratory failure with hypoxia and hypercapnia (HCC) Pneumonia Now resolved   Essential hypertension, benign Hold Norvasc    GERD (gastroesophageal reflux disease) Continue PPI   Generalized weakness Therapy evaluations as tolerated Will benefit from placement to skilled nursing facility, family is deciding on a facility   Obesity (BMI 30-39.9) BMI 33.16 Complicating factor in overall care and prognosis    DVT prophylaxis: lovenox  Code Status: Full Family Communication:  granddaughter brittany updated telephonically 6/4. No answer when telephoned today. Disposition Plan: Status is:  Inpatient Remains inpatient appropriate because: Multiple acute issues as above   Level of care: Progressive  Consultants:  General surgery  Procedures:  Ex lap  Antimicrobials: Zosyn    Subjective: Seen and examined. No acute events. Remains confused with sitter in place  Objective: Vitals:   10/31/23 0413 10/31/23 0500 10/31/23 0755 10/31/23 1111  BP: 100/85  (!) 108/40 130/68  Pulse: 91  87 85  Resp: 18  19 18   Temp: 98.4 F (36.9 C)  97.9 F (36.6 C) 98 F (36.7 C)  TempSrc:      SpO2: 91%  92% 91%  Weight:  96.5 kg    Height:        Intake/Output Summary (Last 24 hours) at 10/31/2023 1345 Last data filed at 10/31/2023 1257 Gross per 24 hour  Intake 1397 ml  Output 1850 ml  Net -453 ml   Filed Weights   10/29/23 0500 10/30/23 0500 10/31/23 0500  Weight: 98.9 kg 97.5 kg 96.5 kg    Examination:  General exam: Appears fatigued Respiratory system: Coarse breath sounds, normal WOB, RA Cardiovascular system: S1-S2, RRR  Gastrointestinal system: Soft, NT ND, + PEG, abdominal binder in place Central nervous system: Alert and oriented to self Extremities: warm, no edema  Skin: No visible rashes, lesions or ulcers Psychiatry: Judgement and insight appear impaired. Mood & affect confused.     Data Reviewed: I have personally reviewed following labs and imaging studies  CBC: Recent Labs  Lab 10/28/23 0408 10/29/23 0644 10/30/23 0518 10/31/23 0218  WBC 7.7 10.6* 13.5* 16.7*  NEUTROABS 5.1  7.6 10.1* 11.9*  HGB 9.9* 10.6* 9.9* 9.4*  HCT 30.0* 32.0* 30.3* 30.1*  MCV 95.8 97.0 97.7 100.0  PLT 314 314 256 292   Basic Metabolic Panel: Recent Labs  Lab 10/25/23 0600 10/26/23 0449 10/27/23 0452 10/28/23 0408 10/29/23 0644 10/30/23 0518 10/31/23 0218  NA 143 146* 147* 146* 144 145 149*  K 3.2* 3.7 3.6 3.8 4.4 4.1 4.2  CL 107 112* 112* 109 108 111 114*  CO2 28 26 27 27 26 27 26   GLUCOSE 124* 140* 131* 116* 122* 125* 132*  BUN 20 19 22  26* 36* 42* 45*   CREATININE 1.62* 1.80* 2.01* 1.83* 2.15* 1.97* 1.66*  CALCIUM 8.0* 7.9* 7.7* 7.9* 7.8* 7.7* 7.7*  MG 2.2 2.2 2.3  --   --   --   --   PHOS 3.3 4.1 4.0  --   --   --   --    GFR: Estimated Creatinine Clearance: 38 mL/min (A) (by C-G formula based on SCr of 1.66 mg/dL (H)). Liver Function Tests: No results for input(s): "AST", "ALT", "ALKPHOS", "BILITOT", "PROT", "ALBUMIN" in the last 168 hours.  No results for input(s): "LIPASE", "AMYLASE" in the last 168 hours. No results for input(s): "AMMONIA" in the last 168 hours. Coagulation Profile: No results for input(s): "INR", "PROTIME" in the last 168 hours.  Cardiac Enzymes: No results for input(s): "CKTOTAL", "CKMB", "CKMBINDEX", "TROPONINI" in the last 168 hours. BNP (last 3 results) No results for input(s): "PROBNP" in the last 8760 hours. HbA1C: No results for input(s): "HGBA1C" in the last 72 hours. CBG: Recent Labs  Lab 10/30/23 2100 10/31/23 0013 10/31/23 0341 10/31/23 0752 10/31/23 1129  GLUCAP 117* 117* 133* 133* 115*   Lipid Profile: No results for input(s): "CHOL", "HDL", "LDLCALC", "TRIG", "CHOLHDL", "LDLDIRECT" in the last 72 hours. Thyroid  Function Tests: No results for input(s): "TSH", "T4TOTAL", "FREET4", "T3FREE", "THYROIDAB" in the last 72 hours. Anemia Panel: No results for input(s): "VITAMINB12", "FOLATE", "FERRITIN", "TIBC", "IRON", "RETICCTPCT" in the last 72 hours. Sepsis Labs: No results for input(s): "PROCALCITON", "LATICACIDVEN" in the last 168 hours.   Recent Results (from the past 240 hours)  Culture, blood (x 2)     Status: None   Collection Time: 10/22/23  1:00 AM   Specimen: BLOOD  Result Value Ref Range Status   Specimen Description BLOOD BLOOD RIGHT ARM  Final   Special Requests   Final    BOTTLES DRAWN AEROBIC AND ANAEROBIC Blood Culture adequate volume   Culture   Final    NO GROWTH 5 DAYS Performed at [facility], [address] [place], [place] [address]    Report  Status 10/27/2023 FINAL  Final  Culture, blood (x 2)     Status: None   Collection Time: 10/22/23  1:00 AM   Specimen: BLOOD  Result Value Ref Range Status   Specimen Description BLOOD BLOOD RIGHT ARM  Final   Special Requests   Final    BOTTLES DRAWN AEROBIC AND ANAEROBIC Blood Culture adequate volume   Culture   Final    NO GROWTH 5 DAYS Performed at [facility], [address], [place], [place] [address]    Report Status 10/27/2023 FINAL  Final         Radiology Studies: [facility] Chest Port 1 View Result Date: 10/29/2023 CLINICAL DATA:  Hypoxia EXAM: PORTABLE CHEST 1 VIEW COMPARISON:  Chest x-ray 10/28/2023 FINDINGS: Cardiac silhouette is enlarged, unchanged. The aorta is tortuous. There is central pulmonary vascular congestion.  There is no focal lung consolidation, pleural effusion or pneumothorax. No acute fractures are seen. IMPRESSION: Cardiomegaly with central pulmonary vascular congestion. Electronically Signed   By: Tyron Gallon M.D.   On: 10/29/2023 21:05         Scheduled Meds:  Chlorhexidine  Gluconate Cloth  6 each Topical Q0600   enoxaparin  (LOVENOX ) injection  40 mg Subcutaneous Q24H   feeding supplement (PROSource TF20)  60 mL Per Tube BID   free water   250 mL Per Tube Q4H   gentamicin  ointment   Topical TID   insulin  aspart  0-9 Units Subcutaneous Q4H   mouth rinse  15 mL Mouth Rinse 4 times per day   pantoprazole  (PROTONIX ) IV  40 mg Intravenous Q24H   Continuous Infusions:  feeding supplement (OSMOLITE 1.5 CAL) 60 mL/hr at 10/31/23 0433     LOS: 26 days      Raymonde Calico, MD [facility]   If 7PM-7AM, please contact night-coverage  10/31/2023, 1:45 PM

## 2023-10-31 NOTE — Plan of Care (Signed)
  Problem: Education: Goal: Knowledge of General Education information will improve Description: Including pain rating scale, medication(s)/side effects and non-pharmacologic comfort measures Outcome: Progressing   Problem: Health Behavior/Discharge Planning: Goal: Ability to manage health-related needs will improve Outcome: Progressing   Problem: Clinical Measurements: Goal: Ability to maintain clinical measurements within normal limits will improve Outcome: Progressing Goal: Will remain free from infection Outcome: Progressing Goal: Diagnostic test results will improve Outcome: Progressing Goal: Respiratory complications will improve Outcome: Progressing Goal: Cardiovascular complication will be avoided Outcome: Progressing   Problem: Activity: Goal: Risk for activity intolerance will decrease Outcome: Progressing   Problem: Nutrition: Goal: Adequate nutrition will be maintained Outcome: Progressing   Problem: Coping: Goal: Level of anxiety will decrease Outcome: Progressing   Problem: Elimination: Goal: Will not experience complications related to bowel motility Outcome: Progressing Goal: Will not experience complications related to urinary retention Outcome: Progressing   Problem: Pain Managment: Goal: General experience of comfort will improve and/or be controlled Outcome: Progressing   Problem: Safety: Goal: Ability to remain free from injury will improve Outcome: Progressing   Problem: Skin Integrity: Goal: Risk for impaired skin integrity will decrease Outcome: Progressing   Problem: Activity: Goal: Ability to tolerate increased activity will improve Outcome: Progressing   Problem: Clinical Measurements: Goal: Ability to maintain a body temperature in the normal range will improve Outcome: Progressing   Problem: Respiratory: Goal: Ability to maintain adequate ventilation will improve Outcome: Progressing Goal: Ability to maintain a clear airway  will improve Outcome: Progressing   Problem: Education: Goal: Ability to describe self-care measures that may prevent or decrease complications (Diabetes Survival Skills Education) will improve Outcome: Progressing Goal: Individualized Educational Video(s) Outcome: Progressing   Problem: Coping: Goal: Ability to adjust to condition or change in health will improve Outcome: Progressing   Problem: Fluid Volume: Goal: Ability to maintain a balanced intake and output will improve Outcome: Progressing   Problem: Health Behavior/Discharge Planning: Goal: Ability to identify and utilize available resources and services will improve Outcome: Progressing Goal: Ability to manage health-related needs will improve Outcome: Progressing   Problem: Metabolic: Goal: Ability to maintain appropriate glucose levels will improve Outcome: Progressing   Problem: Nutritional: Goal: Maintenance of adequate nutrition will improve Outcome: Progressing Goal: Progress toward achieving an optimal weight will improve Outcome: Progressing   Problem: Skin Integrity: Goal: Risk for impaired skin integrity will decrease Outcome: Progressing   Problem: Tissue Perfusion: Goal: Adequacy of tissue perfusion will improve Outcome: Progressing   Problem: Activity: Goal: Ability to tolerate increased activity will improve Outcome: Progressing   Problem: Respiratory: Goal: Ability to maintain a clear airway and adequate ventilation will improve Outcome: Progressing   Problem: Role Relationship: Goal: Method of communication will improve Outcome: Progressing   Problem: Fluid Volume: Goal: Hemodynamic stability will improve Outcome: Progressing   Problem: Clinical Measurements: Goal: Diagnostic test results will improve Outcome: Progressing Goal: Signs and symptoms of infection will decrease Outcome: Progressing   Problem: Respiratory: Goal: Ability to maintain adequate ventilation will  improve Outcome: Progressing

## 2023-10-31 NOTE — Progress Notes (Signed)
 Physical Therapy Treatment Patient Details Name: Robert Vega MRN: [id number] DOB: 1940/11/10 Today's Date: 10/31/2023   History of Present Illness 83 y.o male with significant PMH of OSA, GERD, Obesity, HTN, Dysphagia: EGD 03/2022 with food in upper esophagus complicated by aspiration event, cardiac arrest with round of CPR, and post resuscitation EGD with concern for lack of peristalsis. Pt presented to ED on 10/05/2023 with hypoxia, fever and generalized weakness; developed acute respiratory failure requiring intubation 5/10 due to aspiration pneumonia, extubated 5/15, but had significant agitation required brief course of Precedex ; pt now s/p exploratory laparotomy with closure of gastrotomy and insertion of gastrostomy tube on 10/22/23    PT Comments  Pt received upright in bed with sitter present. Pt is alert but remains greatly confused and impulsive making progress in functional mobility difficult. Pt continues to need 2 person assist for bed mobility, STS to RW. Pt impulsively letting go of RW, grabs mitt to blow his nose into. Regular minA+2 to CGA+2 with static standing. Pt has incontinent BM during standing with pt stepping into stool with pt not able to be re-directed out of stool or to laterally step ultimately needing modA+2 to force pt to sit EOB. OT and sitter assisting in donning/doffing socks, totalA+2 to return to supine upright in bed in care of NT and OT for pericare. D/c recs remain appropriate.    If plan is discharge home, recommend the following: Two people to help with walking and/or transfers;Two people to help with bathing/dressing/bathroom;Supervision due to cognitive status   Can travel by private vehicle     No  Equipment Recommendations  Other (comment) (TBD)    Recommendations for Other Services       Precautions / Restrictions Precautions Precautions: Fall Recall of Precautions/Restrictions: Impaired Precaution/Restrictions Comments: abdominal binder  donned. Restrictions Weight Bearing Restrictions Per Provider Order: No     Mobility  Bed Mobility Overal bed mobility: Needs Assistance Bed Mobility: Supine to Sit, Sit to Supine     Supine to sit: Max assist, HOB elevated, Used rails, +2 for physical assistance Sit to supine: Total assist, +2 for physical assistance   General bed mobility comments: pt with more confusion, unable to follow commands consistently this date, easily distracted, needed Max A x2 to reach EOB and total x2 to return to supine; max A x2 to scoot to [facility]; Max A x1 to roll to side for peri-care Patient Response: Cooperative  Transfers Overall transfer level: Needs assistance Equipment used: Rolling walker (2 wheels) Transfers: Sit to/from Stand Sit to Stand: +2 physical assistance, From elevated surface, Mod assist           General transfer comment: VC's for hand placement to RW but pt unable to comply.    Ambulation/Gait                   Stairs             Wheelchair Mobility     Tilt Bed Tilt Bed Patient Response: Cooperative  Modified Rankin (Stroke Patients Only)       Balance Overall balance assessment: Needs assistance Sitting-balance support: Feet supported, No upper extremity supported, Bilateral upper extremity supported Sitting balance-Leahy Scale: Fair Sitting balance - Comments: SBA   Standing balance support: Bilateral upper extremity supported, Reliant on assistive device for balance Standing balance-Leahy Scale: Poor Standing balance comment: Mod A X2 from EOB, Min/CGA x2 to maintain standing balance at RW, pt frequently letting go and forgetting to use it  Communication Communication Communication: Impaired Factors Affecting Communication: Reduced clarity of speech  Cognition Arousal: Alert Behavior During Therapy: Impulsive                           PT - Cognition Comments: Pt remains very confused  mistaking a mitt as a tissue blwing his nose. Also unaware of incontinent BM on floor. Very difficult to redirect once standing. Following commands: Impaired Following commands impaired: Follows one step commands with increased time, Follows one step commands inconsistently    Cueing Cueing Techniques: Verbal cues, Tactile cues, Gestural cues  Exercises      General Comments General comments (skin integrity, edema, etc.): sitter present throughout session; lines/leads intact pre and post session      Pertinent Vitals/Pain Pain Assessment Pain Assessment: Faces Faces Pain Scale: No hurt    Home Living                          Prior Function            PT Goals (current goals can now be found in the care plan section) Acute Rehab PT Goals Patient Stated Goal: none stated PT Goal Formulation: Patient unable to participate in goal setting Time For Goal Achievement: 11/06/23 Potential to Achieve Goals: Fair Progress towards PT goals: Progressing toward goals    Frequency    Min 2X/week      PT Plan      Co-evaluation PT/OT/SLP Co-Evaluation/Treatment: Yes Reason for Co-Treatment: To address functional/ADL transfers;Necessary to address cognition/behavior during functional activity;For patient/therapist safety PT goals addressed during session: Mobility/safety with mobility OT goals addressed during session: ADL's and self-care      AM-PAC PT "6 Clicks" Mobility   Outcome Measure  Help needed turning from your back to your side while in a flat bed without using bedrails?: A Lot Help needed moving from lying on your back to sitting on the side of a flat bed without using bedrails?: Total Help needed moving to and from a bed to a chair (including a wheelchair)?: Total Help needed standing up from a chair using your arms (e.g., wheelchair or bedside chair)?: A Lot Help needed to walk in hospital room?: Total Help needed climbing 3-5 steps with a railing? :  Total 6 Click Score: 8    End of Session Equipment Utilized During Treatment: Gait belt Activity Tolerance: Other (comment) (incontinent BM epsisode) Patient left: in bed;with call bell/phone within reach;with bed alarm set;with nursing/sitter in room Nurse Communication: Mobility status PT Visit Diagnosis: Unsteadiness on feet (R26.81);Repeated falls (R29.6);Muscle weakness (generalized) (M62.81);Difficulty in walking, not elsewhere classified (R26.2);Other abnormalities of gait and mobility (R26.89);Pain     Time: 1610-9604 PT Time Calculation (min) (ACUTE ONLY): 17 min  Charges:    $Therapeutic Activity: 8-22 mins PT General Charges $$ ACUTE PT VISIT: 1 Visit                     Marc Senior. Fairly IV, PT, DPT Physical Therapist-   [facility] 10/31/2023, 4:11 PM

## 2023-10-31 NOTE — Progress Notes (Signed)
 Occupational Therapy Treatment Patient Details Name: Robert Vega MRN: [id number] DOB: June 22, 1940 Today's Date: 10/31/2023   History of present illness 83 y.o male with significant PMH of OSA, GERD, Obesity, HTN, Dysphagia: EGD 03/2022 with food in upper esophagus complicated by aspiration event, cardiac arrest with round of CPR, and post resuscitation EGD with concern for lack of peristalsis. Pt presented to ED on 10/05/2023 with hypoxia, fever and generalized weakness; developed acute respiratory failure requiring intubation 5/10 due to aspiration pneumonia, extubated 5/15, but had significant agitation required brief course of Precedex ; pt now s/p exploratory laparotomy with closure of gastrotomy and insertion of gastrostomy tube on 10/22/23   OT comments  Pt is supine in bed on arrival. Alert, awake with eyes open and agreeable to PT/OT co-tx session to maximize pt/therapist safety d/t poor cognition. He does not appear in pain. Pt performed bed mobility with Max to total assist x2 as pt remains very cognitively limited and unable to initiate tasks. Pt reaches for therapists legs/arms etc while trying to get to EOB. Able to sit with SBA at EOB in prep for standing. Mod A x2 for STS to RW, despite cues pt wishes to pull on RW. Able to perform standing marches, then takes a step forward and reaches to get item from countertop to blow his nose (into mitten). Min to CGA x2 for standing balance during reaching, then pt noted to have large runny BM in the floor. PT/OT attempting to have pt ambulate around to the other side of the time to avoid stepping in it, however pt unable to redirect or follow directions. Frequently releases RW, unable to follow 1 step command to return to seated EOB without Max A. Total A x2 to return to supine, Max A x1 for rolling in bed for peri-care to be performed with Total assist. LB dressing to exchange socks with total assist seated at EOB. OT placed call to granddaughter to  update on his progress/status and she would like to attempt getting him OOB to recliner tomorrow with sitter present. Pt was returned to bed with all needs in place and asleep quickly after session. He will cont to require skilled acute OT services to maximize his safety and IND to return to PLOF.       If plan is discharge home, recommend the following:  Assistance with cooking/housework;Help with stairs or ramp for entrance;Supervision due to cognitive status;A lot of help with bathing/dressing/bathroom;Two people to help with walking and/or transfers   Equipment Recommendations  Other (comment) (defer)    Recommendations for Other Services      Precautions / Restrictions Precautions Recall of Precautions/Restrictions: Impaired Precaution/Restrictions Comments: abdominal binder donned. Restrictions Weight Bearing Restrictions Per Provider Order: No       Mobility Bed Mobility Overal bed mobility: Needs Assistance Bed Mobility: Supine to Sit, Sit to Supine Rolling: Max assist   Supine to sit: Max assist, HOB elevated, Used rails, +2 for physical assistance Sit to supine: Total assist, +2 for physical assistance   General bed mobility comments: pt with more confusion, unable to follow commands consistently this date, easily distracted, needed Max A x2 to reach EOB and total x2 to return to supine; max A x2 to scoot to [facility]; Max A x1 to roll to side for peri-care    Transfers Overall transfer level: Needs assistance Equipment used: Rolling walker (2 wheels) Transfers: Sit to/from Stand Sit to Stand: +2 physical assistance, From elevated surface, Mod assist  General transfer comment: utilized RW this date, pt trying to pull on it to stand, unable to redirect to push up from EOB; Mod A x2 from EOB and min/CGA x2 to maintain standing balance during standing marches and taking 1-2 steps forward; despite multi-modal cues and 1 step commands, pt required force to return to  seated EOB for clean up d/t large runny BM in floor while standing at bedside     Balance Overall balance assessment: Needs assistance Sitting-balance support: Feet supported, Bilateral upper extremity supported Sitting balance-Leahy Scale: Fair Sitting balance - Comments: no LOB-CGA/SBA seated EOB once positioned   Standing balance support: Bilateral upper extremity supported, Reliant on assistive device for balance Standing balance-Leahy Scale: Poor Standing balance comment: Mod A X2 from EOB, Min/CGA x2 to maintain standing balance at RW, pt frequently letting go and forgetting to use it                           ADL either performed or assessed with clinical judgement   ADL Overall ADL's : Needs assistance/impaired       Grooming Details (indicate cue type and reason): pt stood to blow his nose, able to reach for mitten off the counter with Min/CGA x2 for safety to maintain balance             Lower Body Dressing: Maximal assistance;Sitting/lateral leans Lower Body Dressing Details (indicate cue type and reason): don socks     Toileting- Clothing Manipulation and Hygiene: Maximal assistance;Total assistance;Bed level Toileting - Clothing Manipulation Details (indicate cue type and reason): peri-care performed at bed level d/t pt hard to redirect to sit back down after standing at bedside with PT/OT            Extremity/Trunk Assessment              Vision       Perception     Praxis     Communication Communication Communication: Impaired Factors Affecting Communication: Reduced clarity of speech   Cognition Arousal: Alert Behavior During Therapy: Impulsive                                 Following commands: Impaired Following commands impaired: Follows one step commands with increased time, Follows one step commands inconsistently      Cueing   Cueing Techniques: Verbal cues, Tactile cues  Exercises Other Exercises Other  Exercises: Placed call to pt's granddaughter to provide update on his status/progress this date. She would like him to get OOB to chair on next session.    Shoulder Instructions       General Comments sitter present throughout session; lines/leads intact pre and post session    Pertinent Vitals/ Pain       Pain Assessment Pain Assessment: Faces Faces Pain Scale: No hurt Pain Intervention(s): Monitored during session  Home Living                                          Prior Functioning/Environment              Frequency  Min 2X/week        Progress Toward Goals  OT Goals(current goals can now be found in the care plan section)  Progress towards OT goals: Progressing toward goals  Acute Rehab OT Goals OT Goal Formulation: Patient unable to participate in goal setting Time For Goal Achievement: 11/06/23 Potential to Achieve Goals: Fair  Plan      Co-evaluation    PT/OT/SLP Co-Evaluation/Treatment: Yes Reason for Co-Treatment: To address functional/ADL transfers;Necessary to address cognition/behavior during functional activity;For patient/therapist safety PT goals addressed during session: Mobility/safety with mobility OT goals addressed during session: ADL's and self-care      AM-PAC OT "6 Clicks" Daily Activity     Outcome Measure   Help from another person eating meals?: Total (feeding tube) Help from another person taking care of personal grooming?: A Lot Help from another person toileting, which includes using toliet, bedpan, or urinal?: A Lot Help from another person bathing (including washing, rinsing, drying)?: A Lot Help from another person to put on and taking off regular upper body clothing?: A Lot Help from another person to put on and taking off regular lower body clothing?: A Lot 6 Click Score: 11    End of Session Equipment Utilized During Treatment: Gait belt;Rolling walker (2 wheels)  OT Visit Diagnosis: Unsteadiness  on feet (R26.81);Repeated falls (R29.6);Muscle weakness (generalized) (M62.81)   Activity Tolerance Patient tolerated treatment well;Other (comment) (limited by cognition)   Patient Left in bed;with call bell/phone within reach;with bed alarm set;with nursing/sitter in room   Nurse Communication Mobility status        Time: 1455-1533 OT Time Calculation (min): 38 min  Charges: OT General Charges $OT Visit: 1 Visit OT Treatments $Self Care/Home Management : 8-22 mins $Therapeutic Activity: 8-22 mins  Jax Abdelrahman, OTR/L  10/31/23, 3:56 PM   Shawntrice Salle E Chanoch Mccleery 10/31/2023, 3:49 PM

## 2023-10-31 NOTE — TOC Progression Note (Addendum)
 Transition of Care [facility]) - Progression Note    Patient Details  Name: Robert Vega MRN: [id number] Date of Birth: Dec 10, 1940  Transition of Care [facility]) CM/SW Contact  Baird Bombard, RN Phone Number: 10/31/2023, 12:00 PM  Clinical Narrative:    Attempt to reach patient's Granddaughter, [name]. No answer. Left a message.   12:19pm Retrieved call from patient's Granddaughter, [name]. She stated she is still reviewing the list of facilities provided. She may want [facility] but wants to consider a few more on the list.She was advised though patient is not medically ready at this moment an approved  authorization would have to be obtained.           Expected Discharge Plan and Services                                               Social Determinants of Health (SDOH) Interventions SDOH Screenings   Food Insecurity: Patient Unable To Answer (10/06/2023)  Recent Concern: Food Insecurity - Food Insecurity Present (09/11/2023)   Received from [facility]  Housing: Patient Unable To Answer (10/06/2023)  Recent Concern: Housing - High Risk (09/11/2023)   Received from [facility]  Transportation Needs: Patient Unable To Answer (10/06/2023)  Utilities: Patient Unable To Answer (10/06/2023)  Depression (PHQ2-9): Low Risk  (05/07/2023)  Financial Resource Strain: Medium Risk (09/11/2023)   Received from [facility]  Social Connections: Unknown (10/06/2023)  Tobacco Use: Medium Risk (10/22/2023)    Readmission Risk Interventions     No data to display

## 2023-11-01 DIAGNOSIS — R6520 Severe sepsis without septic shock: Secondary | ICD-10-CM | POA: Diagnosis not present

## 2023-11-01 DIAGNOSIS — A419 Sepsis, unspecified organism: Secondary | ICD-10-CM | POA: Diagnosis not present

## 2023-11-01 LAB — BASIC METABOLIC PANEL WITH GFR
Anion gap: 9 (ref 5–15)
BUN: 51 mg/dL — ABNORMAL HIGH (ref 8–23)
CO2: 26 mmol/L (ref 22–32)
Calcium: 7.8 mg/dL — ABNORMAL LOW (ref 8.9–10.3)
Chloride: 115 mmol/L — ABNORMAL HIGH (ref 98–111)
Creatinine, Ser: 1.77 mg/dL — ABNORMAL HIGH (ref 0.61–1.24)
GFR, Estimated: 37 mL/min — ABNORMAL LOW (ref >60)
Glucose, Bld: 153 mg/dL — ABNORMAL HIGH (ref 70–99)
Potassium: 4.1 mmol/L (ref 3.5–5.1)
Sodium: 150 mmol/L — ABNORMAL HIGH (ref 135–145)

## 2023-11-01 LAB — GLUCOSE, CAPILLARY
Glucose-Capillary: 125 mg/dL — ABNORMAL HIGH (ref 70–99)
Glucose-Capillary: 124 mg/dL — ABNORMAL HIGH (ref 70–99)
Glucose-Capillary: 114 mg/dL — ABNORMAL HIGH (ref 70–99)
Glucose-Capillary: 148 mg/dL — ABNORMAL HIGH (ref 70–99)
Glucose-Capillary: 139 mg/dL — ABNORMAL HIGH (ref 70–99)
Glucose-Capillary: 106 mg/dL — ABNORMAL HIGH (ref 70–99)

## 2023-11-01 MED ORDER — FREE WATER
200.0000 mL | Status: DC
  Administered 2023-11-01 – 2023-11-02 (×13): 200 mL

## 2023-11-01 MED ORDER — FREE WATER: 250.0000 mL | Status: DC

## 2023-11-01 NOTE — Progress Notes (Signed)
 Occupational Therapy Treatment Patient Details Name: Robert Vega MRN: [id number] DOB: November 15, 1940 Today's Date: 11/01/2023   History of present illness 83 y.o male with significant PMH of OSA, GERD, Obesity, HTN, Dysphagia: EGD 03/2022 with food in upper esophagus complicated by aspiration event, cardiac arrest with round of CPR, and post resuscitation EGD with concern for lack of peristalsis. Pt presented to ED on 10/05/2023 with hypoxia, fever and generalized weakness; developed acute respiratory failure requiring intubation 5/10 due to aspiration pneumonia, extubated 5/15, but had significant agitation required brief course of Precedex ; pt now s/p exploratory laparotomy with closure of gastrotomy and insertion of gastrostomy tube on 10/22/23   OT comments  Pt is supine in bed on arrival. Alert and agreeable to PT/OT co-tx session to maximize pt/therapist safety d/t poor cognition. Pt did not appear to have any pain this date. He is able to answer questions more efficiently and follow one step commands with cueing during session. Improvement in bed mobility with Min A x2 this date with continued cueing. Min A x2 for 6 total STS trials from EOB and recliner, cueing for hand/feet placement and initiation. Mod A x2 for step turn and sit in recliner d/t cueing, initial step by step instruction and increased time needed. He did fatigue easily, although sp02 stable on RA. Pt able to blow his nose when provided tissue, although he attempted to use his gown initially. Pt able to be redirected today and follow cues. Pt left seated in recliner with sitter present and nurse aware of pt being up in the chair. He had all needs in place and will cont to require skilled acute OT services to maximize his safety and IND to return to PLOF.       If plan is discharge home, recommend the following:  Assistance with cooking/housework;Help with stairs or ramp for entrance;Supervision due to cognitive status;A lot of help  with bathing/dressing/bathroom;Two people to help with walking and/or transfers   Equipment Recommendations  Other (comment) (defer)    Recommendations for Other Services      Precautions / Restrictions Precautions Precautions: Fall Recall of Precautions/Restrictions: Impaired Precaution/Restrictions Comments: abdominal binder donned. Restrictions Weight Bearing Restrictions Per Provider Order: No       Mobility Bed Mobility Overal bed mobility: Needs Assistance Bed Mobility: Supine to Sit     Supine to sit: Min assist, +2 for physical assistance     General bed mobility comments: min A x2 for supine to sit at EOB today, cueing for initation of task with multi modal cueing provided, however able to follow short 1 step commands with more consistency this date    Transfers Overall transfer level: Needs assistance Equipment used: 2 person hand held assist Transfers: Sit to/from Stand, Bed to chair/wheelchair/BSC Sit to Stand: Min assist, +2 physical assistance     Step pivot transfers: Mod assist, +2 physical assistance, +2 safety/equipment     General transfer comment: Min A x2 for 6 STS trials this date, once from EOB and 5x from recliner with cueing for hand/feet placement, pt tends to have narrow BOS prior to stand, continues to need multi modal cueing, but less than previous dates; increased time and no LOB noted     Balance Overall balance assessment: Needs assistance Sitting-balance support: Bilateral upper extremity supported, Feet supported Sitting balance-Leahy Scale: Fair Sitting balance - Comments: SBA at EOB   Standing balance support: Bilateral upper extremity supported Standing balance-Leahy Scale: Poor Standing balance comment: Min A x2 from EOB  with cueing for hand/feet placement and initation, Mod A x2 for SPT to recliner                           ADL either performed or assessed with clinical judgement   ADL Overall ADL's : Needs  assistance/impaired                     Lower Body Dressing: Maximal assistance;Sitting/lateral leans;Sit to/from stand;Total assistance Lower Body Dressing Details (indicate cue type and reason): to don brief prior to transfer Toilet Transfer: Minimal assistance;Cueing for sequencing;Cueing for safety;+2 for physical assistance;+2 for safety/equipment Toilet Transfer Details (indicate cue type and reason): simulated to recliner with +2 assist and mod/max multi modal cueing for sequencing           General ADL Comments: able to blow his nose once provided tissue    Extremity/Trunk Assessment              Vision       Perception     Praxis     Communication Communication Communication: Impaired Factors Affecting Communication: Reduced clarity of speech   Cognition Arousal: Alert Behavior During Therapy: [facility] for tasks assessed/performed                                 Following commands: Impaired Following commands impaired: Follows one step commands with increased time, Follows one step commands inconsistently      Cueing   Cueing Techniques: Verbal cues, Tactile cues, Gestural cues  Exercises      Shoulder Instructions       General Comments sitter present    Pertinent Vitals/ Pain       Pain Assessment Pain Assessment: No/denies pain  Home Living                                          Prior Functioning/Environment              Frequency  Min 2X/week        Progress Toward Goals  OT Goals(current goals can now be found in the care plan section)  Progress towards OT goals: Progressing toward goals  Acute Rehab OT Goals Patient Stated Goal: get better OT Goal Formulation: Patient unable to participate in goal setting Time For Goal Achievement: 11/06/23 Potential to Achieve Goals: Fair  Plan      Co-evaluation    PT/OT/SLP Co-Evaluation/Treatment: Yes Reason for Co-Treatment: To address  functional/ADL transfers;Necessary to address cognition/behavior during functional activity PT goals addressed during session: Mobility/safety with mobility OT goals addressed during session: ADL's and self-care      AM-PAC OT "6 Clicks" Daily Activity     Outcome Measure   Help from another person eating meals?: Total (feeding tube) Help from another person taking care of personal grooming?: A Lot Help from another person toileting, which includes using toliet, bedpan, or urinal?: A Lot Help from another person bathing (including washing, rinsing, drying)?: A Lot Help from another person to put on and taking off regular upper body clothing?: A Lot Help from another person to put on and taking off regular lower body clothing?: A Lot 6 Click Score: 11    End of Session Equipment Utilized During Treatment: Gait belt  OT Visit  Diagnosis: Unsteadiness on feet (R26.81);Repeated falls (R29.6);Muscle weakness (generalized) (M62.81)   Activity Tolerance Patient tolerated treatment well   Patient Left with call bell/phone within reach;with nursing/sitter in room;in chair;with chair alarm set   Nurse Communication Mobility status        Time: 8295-6213 OT Time Calculation (min): 39 min  Charges: OT General Charges $OT Visit: 1 Visit OT Treatments $Therapeutic Activity: 23-37 mins  Katrine Radich, OTR/L  11/01/23, 1:19 PM   Kimberly Coye E Anallely Rosell 11/01/2023, 1:16 PM

## 2023-11-01 NOTE — Progress Notes (Signed)
 Physical Therapy Treatment Patient Details Name: Robert Vega MRN: [id number] DOB: Dec 18, 1940 Today's Date: 11/01/2023   History of Present Illness 83 y.o male with significant PMH of OSA, GERD, Obesity, HTN, Dysphagia: EGD 03/2022 with food in upper esophagus complicated by aspiration event, cardiac arrest with round of CPR, and post resuscitation EGD with concern for lack of peristalsis. Pt presented to ED on 10/05/2023 with hypoxia, fever and generalized weakness; developed acute respiratory failure requiring intubation 5/10 due to aspiration pneumonia, extubated 5/15, but had significant agitation required brief course of Precedex ; pt now s/p exploratory laparotomy with closure of gastrotomy and insertion of gastrostomy tube on 10/22/23    PT Comments  PT/OT co-treat performed. Today's tx was a continuation of progressing bed mobility and transfers as able. Pt was able to complete bed mobility exercises with min A x2 with continued verbal and tactile cues on placement of limbs, as pt able to follow one step commands more consistently when compared to previous sessions. From EOB to the recliner ambulation proved safe with x2 hand held assist, having to provide pt with step by step command but no LOB experienced. STS transfer completed x5 with a range of CGA to min A x2, repeatedly using multimodal cues on placement of hands and feet to maximize safety, as well as redirecting pt to task at hand. Vitals remained WFL throughout session despite pt visibly performing labored breathing but given his lack of mobility recently, it is expected. Pt will continue to benefit from skilled PT interventions to progress towards goals.    If plan is discharge home, recommend the following: Two people to help with walking and/or transfers;Two people to help with bathing/dressing/bathroom;Supervision due to cognitive status   Can travel by private vehicle     No  Equipment Recommendations  Other (comment)     Recommendations for Other Services       Precautions / Restrictions Precautions Precautions: Fall Recall of Precautions/Restrictions: Impaired Precaution/Restrictions Comments: abdominal binder donned. Restrictions Weight Bearing Restrictions Per Provider Order: No     Mobility  Bed Mobility Overal bed mobility: Needs Assistance Bed Mobility: Supine to Sit Rolling: Min assist, +2 for physical assistance Sidelying to sit: Min assist, +2 for physical assistance Supine to sit: Min assist, +2 for physical assistance     General bed mobility comments: pt continues to need multiple verbal and tactile cues for hand placement during transition but able to follow one step commands better when compared to previous sesisons    Transfers Overall transfer level: Needs assistance Equipment used: 2 person hand held assist Transfers: Sit to/from Stand Sit to Stand: Min assist, +2 physical assistance           General transfer comment: verbal and tactile cues on limb placement and initiation of transfer, no LOB experienced    Ambulation/Gait Ambulation/Gait assistance: Mod assist, +2 physical assistance Gait Distance (Feet): 5 Feet Assistive device: 2 person hand held assist Gait Pattern/deviations: Shuffle Gait velocity: decreased     General Gait Details: step by step verbal commands on placement of bilat feet, slow and shuffled gait at this time   Stairs             Wheelchair Mobility     Tilt Bed    Modified Rankin (Stroke Patients Only)       Balance Overall balance assessment: Needs assistance Sitting-balance support: Bilateral upper extremity supported, Feet supported Sitting balance-Leahy Scale: Fair Sitting balance - Comments: SBA Postural control: Posterior lean Standing balance  support: Bilateral upper extremity supported Standing balance-Leahy Scale: Poor Standing balance comment: Mod A x2 from EOB, verbal cues given for placement of limbs to  maintain balance but able to decrease cues as pt progressed toward recliner                            Communication Communication Communication: Impaired Factors Affecting Communication: Reduced clarity of speech  Cognition Arousal: Alert Behavior During Therapy: WFL for tasks assessed/performed   PT - Cognitive impairments: History of cognitive impairments                       PT - Cognition Comments: Pt more alert and cognitively with it this date, continues to sometimes go off in tangents but is easily redirected with verbal and tactile cues to task at hand Following commands: Impaired Following commands impaired: Follows one step commands with increased time, Follows one step commands inconsistently    Cueing Cueing Techniques: Verbal cues, Tactile cues, Gestural cues  Exercises Other Exercises Other Exercises: x5 STS in recliner, min A x2--> CGA,  provided multimodal cues on biomechanical alignment of bilat feet and hand placement on arm rests to safely propel into a standing position, pt with labored breathing throughout demonstrating increased energy expenditure, managed vitals throughout: start 93 HR, 97% on RA, end 89 HR and 98%, able to decrease verbal cueing for hand placement as pt progressed through repetitions but did require redirecting toward task at hand    General Comments        Pertinent Vitals/Pain Pain Assessment Pain Assessment: No/denies pain Faces Pain Scale: No hurt Breathing: normal Negative Vocalization: none Facial Expression: smiling or inexpressive Body Language: relaxed Consolability: no need to console PAINAD Score: 0    Home Living                          Prior Function            PT Goals (current goals can now be found in the care plan section) Acute Rehab PT Goals Patient Stated Goal: none stated PT Goal Formulation: Patient unable to participate in goal setting Time For Goal Achievement:  11/06/23 Potential to Achieve Goals: Fair Progress towards PT goals: Progressing toward goals    Frequency    Min 2X/week      PT Plan      Co-evaluation PT/OT/SLP Co-Evaluation/Treatment: Yes Reason for Co-Treatment: To address functional/ADL transfers;Necessary to address cognition/behavior during functional activity PT goals addressed during session: Mobility/safety with mobility        AM-PAC PT "6 Clicks" Mobility   Outcome Measure  Help needed turning from your back to your side while in a flat bed without using bedrails?: A Lot Help needed moving from lying on your back to sitting on the side of a flat bed without using bedrails?: A Lot Help needed moving to and from a bed to a chair (including a wheelchair)?: A Lot Help needed standing up from a chair using your arms (e.g., wheelchair or bedside chair)?: A Lot Help needed to walk in hospital room?: A Lot   6 Click Score: 10    End of Session Equipment Utilized During Treatment: Gait belt Activity Tolerance: Patient tolerated treatment well Patient left: in chair;with chair alarm set;with nursing/sitter in room;with call bell/phone within reach Nurse Communication: Mobility status PT Visit Diagnosis: Unsteadiness on feet (R26.81);Repeated falls (R29.6);Muscle weakness (  generalized) (M62.81);Difficulty in walking, not elsewhere classified (R26.2);Other abnormalities of gait and mobility (R26.89);Pain     Time: 4098-1191 PT Time Calculation (min) (ACUTE ONLY): 39 min  Charges:                               Doloras Tellado Romero-Perozo, SPT  11/01/2023, 11:07 AM

## 2023-11-01 NOTE — TOC Progression Note (Addendum)
 Transition of Care [facility]) - Progression Note    Patient Details  Name: Robert Vega MRN: [id number] Date of Birth: [birth year]  [facility]) CM/SW Contact  Odilia Bennett, LCSW Phone Number: 11/01/2023, 2:12 PM  Clinical Narrative: Granddaughter has not chosen a facility yet but she will call, text, or email CSW with decision. She is waiting on a call from therapy regarding how he did today. CSW sent secure chat to PT to let them know.    3:07 pm: Received email from granddaughter stating that she is accepting the bed offer from [facility]. CSW left message for admissions coordinator to notify and see if they will have a bed on Monday if stable and insurance authorization is approved.  Expected Discharge Plan and Services                                               Social Determinants of Health (SDOH) Interventions SDOH Screenings   Food Insecurity: Patient Unable To Answer (10/06/2023)  Recent Concern: Food Insecurity - Food Insecurity Present (09/11/2023)   Received from [facility]  Housing: Patient Unable To Answer (10/06/2023)  Recent Concern: Housing - High Risk (09/11/2023)   Received from [facility]  Transportation Needs: Patient Unable To Answer (10/06/2023)  Utilities: Patient Unable To Answer (10/06/2023)  Depression (PHQ2-9): Low Risk  (05/07/2023)  Financial Resource Strain: Medium Risk (09/11/2023)   Received from [facility]  Social Connections: Unknown (10/06/2023)  Tobacco Use: Medium Risk (10/22/2023)    Readmission Risk Interventions     No data to display

## 2023-11-01 NOTE — Plan of Care (Signed)
  Problem: Clinical Measurements: Goal: Will remain free from infection Outcome: Progressing   Problem: Clinical Measurements: Goal: Diagnostic test results will improve Outcome: Progressing   Problem: Clinical Measurements: Goal: Respiratory complications will improve Outcome: Progressing   Problem: Clinical Measurements: Goal: Cardiovascular complication will be avoided Outcome: Progressing   Problem: Pain Managment: Goal: General experience of comfort will improve and/or be controlled Outcome: Progressing

## 2023-11-01 NOTE — Progress Notes (Signed)
 PROGRESS NOTE    Robert Vega  WUJ:[id number] DOB: 1940-07-14 DOA: 10/05/2023 PCP: Helaine Llanos, MD    Brief Narrative:  83 y.o male with significant PMH of OSA, GERD, Obesity, HTN, Dysphagia: EGD 03/2022 with food in upper esophagus complicated by aspiration event, cardiac arrest with round of CPR, and post resuscitation EGD with concern for lack of peristalsis. Prior EGD 08/2020 with note of abnormal cricopharyngeus, decrease in motility in esophagus, and spastic LES who presented to the ED from with hypoxia, fever and generalized weakness.  Patient developed acute respiratory failure requiring intubation due to aspiration pneumonia, he was treated with broad-spectrum antibiotics and managed in ICU. Condition had improved, extubated 5/15, but has significant agitation required brief course of Precedex . 5/10: Admit to [facility] service with sepsis due to Aspiration Pneumonia.  Course complicated by Acute Respiratory Failure due to Aspiration of vomitus requiring s/p cardiac arrest  transfer to ICU and intubation.  5/11 flexible bronchoscopy done by critical care team 5/12 remains intubated 5/13 remains on vent, s/p PEG tube, +vomiting last night, failed SAT/SBT 5/14 extubated successfully but with severe delirium 5/20.  Botulinum toxin injection into the lower esophageal sphincter by Dr. Ole Berkeley   5/21 esophagram showing diffusely distended esophagus with distal obstruction and severe dysmotility suggestive of achalasia. 5/22.  Advised I would not give him any food and will continue PEG feeding he can try liquids to see if that goes down but still high risk for aspiration.  Patient had a fall on the evening of 5/22. 5/23.  Patient feels okay.  Awaken from sleep.  Did not offer any complaints.  Awaiting insurance authorization for rehab. 5/24.  Patient feeling okay.  States he is doing okay with the liquid diet.  Still feels weak. 5/25.  Patient pulled out PEG tube this morning.  Foley catheter  placed in the stoma.  GI able to place a another PEG tube and remove the Foley. 5/26.  Creatinine up at 1.83.  Continue IV fluid hydration.  Patient started having pain after tube feeding started.  CT scan showed that the PEG tube was not in the correct place.  Notified gastroenterology.  Started empiric antibiotics. 5/27.  Patient was taken urgently to the operating room by Dr. Cornel Diesel because the patient had fever and elevated white count with suspected intra-abdominal sepsis. 5/29: Agitation/sundowning noted around shift change 5/28.  Suspect hospital-acquired delirium.  Received doses of atypical antipsychotics.  Much more calm, awake, alert in AM. 6/1: Foley discontinued yesterday.  Has some AKI.  Has been urine.  Mental status improved. 6/2: Kidney function improving, hypernatremia improving 6/3: Lethargy/confusion noted by PT today  Assessment & Plan:   Principal Problem:   Severe sepsis (HCC) Active Problems:   Gastrostomy complication (HCC)   Dysphagia   Achalasia of esophagus   AKI (acute kidney injury) (HCC)   Acute delirium   Acute respiratory failure with hypoxia and hypercapnia (HCC)   Essential hypertension, benign   GERD (gastroesophageal reflux disease)   Multifocal pneumonia   History of dysphagia   Aspiration pneumonia (HCC)   Obesity (BMI 30-39.9)   Generalized weakness   Generalized abdominal pain   Malnutrition of moderate degree   Severe sepsis (HCC) Recurrence with different problem.  Intra-abdominal infection secondary to PEG tube not being in the right place.  Elevated white count and fever and acute kidney injury.  Patient brought emergently to the operating room on 5/27 by Dr. Cornel Diesel.   Plan: Continue tube feeds N.p.o. for  the foreseeable future Continue IV Zosyn  for now, can de-escalate to oral regimen if patient leaves the hospital prior to completion of a total 2-week course of antibiotic Staple removal 6/6 or later  Gastrostomy complication  [facility]) Patient self discontinued PEG tube and malpositioning likely resulted in some intra-abdominal sepsis.  Tube feeds were held.  Restarted 5/28.  Continue at goal rate   AKI (acute kidney injury) (HCC) Resolved  Hypernatremia Will increase free water  flushes from 250 q4 to 200 q2   Achalasia of esophagus Status post botulinum toxin injection on 5/20.   Esophagram showing dilated esophagus distal obstruction. Per GI esophagram continues to demonstrate severe dysmotility with dilated esophagus.  Consistent with end-stage esophageal disease.  Patient is n.p.o. indefinitely.  Should only be fed via PEG tube considering the risk of aspiration. Will need outpatient referral to tertiary advanced GI for consideration of POEMS procedure.  Recommend this only after a few weeks recovery period.  Discussed recommendations with patient's granddaughter via phone   Dysphagia Likely secondary to achalasia.  Patient had Botox  injection of the lower esophageal sphincter due to lower esophageal sphincter hypertension causing the patient not being able to swallow and empty the esophagus of his ingested food.  Food was in the esophagus on the previous EGD.  NPO for forseeable future  Acute delirium Agitation/sundowning Plan: Continue as needed Haldol .  Granddaughter asked to remove prn antipsychotics   Acute respiratory failure with hypoxia and hypercapnia (HCC) Pneumonia Now resolved   Essential hypertension, benign Hold Norvasc    GERD (gastroesophageal reflux disease) Continue PPI   Generalized weakness Therapy evaluations as tolerated Will benefit from placement to skilled nursing facility, family is deciding on a facility   Obesity (BMI 30-39.9) BMI 33.16 Complicating factor in overall care and prognosis    DVT prophylaxis: lovenox  Code Status: Full Family Communication:  granddaughter brittany updated telephonically 6/6 Disposition Plan: Status is: Inpatient Remains inpatient  appropriate because: Multiple acute issues as above   Level of care: Progressive  Consultants:  General surgery  Procedures:  Ex lap  Antimicrobials: Zosyn    Subjective: Seen and examined. No acute events. Remains confused with sitter in place. Tolerated some time in chair today  Objective: Vitals:   11/01/23 0511 11/01/23 0813 11/01/23 1138 11/01/23 1607  BP:  122/65 120/65 114/60  Pulse:  81 80 84  Resp:  20  18  Temp:  98 F (36.7 C) 98.2 F (36.8 C) 98 F (36.7 C)  TempSrc:  Oral Oral Oral  SpO2:  95% 94% 97%  Weight: 97 kg     Height:        Intake/Output Summary (Last 24 hours) at 11/01/2023 1618 Last data filed at 11/01/2023 0800 Gross per 24 hour  Intake --  Output 1280 ml  Net -1280 ml   Filed Weights   10/30/23 0500 10/31/23 0500 11/01/23 0511  Weight: 97.5 kg 96.5 kg 97 kg    Examination:  General exam: Appears fatigued Respiratory system: Coarse breath sounds, normal WOB, RA Cardiovascular system: S1-S2, RRR  Gastrointestinal system: Soft, NT ND, + PEG, abdominal binder in place Central nervous system: Alert and oriented to self Extremities: warm, no edema  Skin: No visible rashes, lesions or ulcers Psychiatry: Judgement and insight appear impaired. Mood & affect confused.     Data Reviewed: I have personally reviewed following labs and imaging studies  CBC: Recent Labs  Lab 10/28/23 0408 10/29/23 0644 10/30/23 0518 10/31/23 0218  WBC 7.7 10.6* 13.5* 16.7*  NEUTROABS  5.1 7.6 10.1* 11.9*  HGB 9.9* 10.6* 9.9* 9.4*  HCT 30.0* 32.0* 30.3* 30.1*  MCV 95.8 97.0 97.7 100.0  PLT 314 314 256 292   Basic Metabolic Panel: Recent Labs  Lab 10/26/23 0449 10/27/23 0452 10/28/23 0408 10/29/23 0644 10/30/23 0518 10/31/23 0218 11/01/23 0954  NA 146* 147* 146* 144 145 149* 150*  K 3.7 3.6 3.8 4.4 4.1 4.2 4.1  CL 112* 112* 109 108 111 114* 115*  CO2 26 27 27 26 27 26 26   GLUCOSE 140* 131* 116* 122* 125* 132* 153*  BUN 19 22 26* 36* 42*  45* 51*  CREATININE 1.80* 2.01* 1.83* 2.15* 1.97* 1.66* 1.77*  CALCIUM 7.9* 7.7* 7.9* 7.8* 7.7* 7.7* 7.8*  MG 2.2 2.3  --   --   --   --   --   PHOS 4.1 4.0  --   --   --   --   --    GFR: Estimated Creatinine Clearance: 35.7 mL/min (A) (by C-G formula based on SCr of 1.77 mg/dL (H)). Liver Function Tests: No results for input(s): "AST", "ALT", "ALKPHOS", "BILITOT", "PROT", "ALBUMIN" in the last 168 hours.  No results for input(s): "LIPASE", "AMYLASE" in the last 168 hours. No results for input(s): "AMMONIA" in the last 168 hours. Coagulation Profile: No results for input(s): "INR", "PROTIME" in the last 168 hours.  Cardiac Enzymes: No results for input(s): "CKTOTAL", "CKMB", "CKMBINDEX", "TROPONINI" in the last 168 hours. BNP (last 3 results) No results for input(s): "PROBNP" in the last 8760 hours. HbA1C: No results for input(s): "HGBA1C" in the last 72 hours. CBG: Recent Labs  Lab 10/31/23 2336 11/01/23 0414 11/01/23 0814 11/01/23 1232 11/01/23 1601  GLUCAP 121* 125* 124* 114* 148*   Lipid Profile: No results for input(s): "CHOL", "HDL", "LDLCALC", "TRIG", "CHOLHDL", "LDLDIRECT" in the last 72 hours. Thyroid  Function Tests: No results for input(s): "TSH", "T4TOTAL", "FREET4", "T3FREE", "THYROIDAB" in the last 72 hours. Anemia Panel: No results for input(s): "VITAMINB12", "FOLATE", "FERRITIN", "TIBC", "IRON", "RETICCTPCT" in the last 72 hours. Sepsis Labs: No results for input(s): "PROCALCITON", "LATICACIDVEN" in the last 168 hours.   No results found for this or any previous visit (from the past 240 hours).        Radiology Studies: No results found.        Scheduled Meds:  Chlorhexidine  Gluconate Cloth  6 each Topical Q0600   enoxaparin  (LOVENOX ) injection  40 mg Subcutaneous Q24H   feeding supplement (PROSource TF20)  60 mL Per Tube BID   free water   200 mL Per Tube Q2H   gentamicin  ointment   Topical TID   insulin  aspart  0-9 Units Subcutaneous  Q4H   mouth rinse  15 mL Mouth Rinse 4 times per day   pantoprazole  (PROTONIX ) IV  40 mg Intravenous Q24H   Continuous Infusions:  feeding supplement (OSMOLITE 1.5 CAL) 1,000 mL (11/01/23 1223)     LOS: 27 days      Raymonde Calico, MD [facility]   If 7PM-7AM, please contact night-coverage  11/01/2023, 4:18 PM

## 2023-11-02 ENCOUNTER — Inpatient Hospital Stay

## 2023-11-02 DIAGNOSIS — J90 Pleural effusion, not elsewhere classified: Secondary | ICD-10-CM | POA: Diagnosis not present

## 2023-11-02 DIAGNOSIS — A419 Sepsis, unspecified organism: Secondary | ICD-10-CM | POA: Diagnosis not present

## 2023-11-02 DIAGNOSIS — N1330 Unspecified hydronephrosis: Secondary | ICD-10-CM | POA: Diagnosis not present

## 2023-11-02 DIAGNOSIS — K8020 Calculus of gallbladder without cholecystitis without obstruction: Secondary | ICD-10-CM | POA: Diagnosis not present

## 2023-11-02 DIAGNOSIS — N134 Hydroureter: Secondary | ICD-10-CM | POA: Diagnosis not present

## 2023-11-02 DIAGNOSIS — R6520 Severe sepsis without septic shock: Secondary | ICD-10-CM | POA: Diagnosis not present

## 2023-11-02 LAB — CBC
HCT: 26.9 % — ABNORMAL LOW (ref 39.0–52.0)
Hemoglobin: 8.7 g/dL — ABNORMAL LOW (ref 13.0–17.0)
MCH: 31.8 pg (ref 26.0–34.0)
MCHC: 32.3 g/dL (ref 30.0–36.0)
MCV: 98.2 fL (ref 80.0–100.0)
Platelets: 332 10*3/uL (ref 150–400)
RBC: 2.74 MIL/uL — ABNORMAL LOW (ref 4.22–5.81)
RDW: 13.3 % (ref 11.5–15.5)
WBC: 17.2 10*3/uL — ABNORMAL HIGH (ref 4.0–10.5)
nRBC: 0 % (ref 0.0–0.2)

## 2023-11-02 LAB — BASIC METABOLIC PANEL WITH GFR
Anion gap: 8 (ref 5–15)
BUN: 56 mg/dL — ABNORMAL HIGH (ref 8–23)
CO2: 28 mmol/L (ref 22–32)
Calcium: 7.7 mg/dL — ABNORMAL LOW (ref 8.9–10.3)
Chloride: 110 mmol/L (ref 98–111)
Creatinine, Ser: 2.04 mg/dL — ABNORMAL HIGH (ref 0.61–1.24)
GFR, Estimated: 32 mL/min — ABNORMAL LOW (ref >60)
Glucose, Bld: 129 mg/dL — ABNORMAL HIGH (ref 70–99)
Potassium: 4.1 mmol/L (ref 3.5–5.1)
Sodium: 146 mmol/L — ABNORMAL HIGH (ref 135–145)

## 2023-11-02 LAB — GLUCOSE, CAPILLARY
Glucose-Capillary: 103 mg/dL — ABNORMAL HIGH (ref 70–99)
Glucose-Capillary: 108 mg/dL — ABNORMAL HIGH (ref 70–99)
Glucose-Capillary: 113 mg/dL — ABNORMAL HIGH (ref 70–99)
Glucose-Capillary: 114 mg/dL — ABNORMAL HIGH (ref 70–99)
Glucose-Capillary: 124 mg/dL — ABNORMAL HIGH (ref 70–99)
Glucose-Capillary: 126 mg/dL — ABNORMAL HIGH (ref 70–99)

## 2023-11-02 MED ORDER — DEXTROSE-SODIUM CHLORIDE 5-0.45 % IV SOLN: INTRAVENOUS | Status: DC

## 2023-11-02 NOTE — Plan of Care (Signed)
     Referral previously received for Lyanne Sample for goals of care discussion. Chart reviewed and updates received from RN. See last PMT note dated 10/17/23.  Chart reviewed and no significant clinical changes or ongoing palliative needs noted. At this time goals are clear. We will sign off for now and discontinue consult order. Please contact us  and enter a new consult order for any new palliative care needs.  Thank you for your referral and allowing PMT to assist in Pieter Fooks Sulser's care.   Ina Manas, AGNP Palliative Medicine Team Phone: [phone]  NO CHARGE

## 2023-11-02 NOTE — Plan of Care (Signed)
  Problem: Education: Goal: Knowledge of General Education information will improve Description: Including pain rating scale, medication(s)/side effects and non-pharmacologic comfort measures Outcome: Progressing   Problem: Health Behavior/Discharge Planning: Goal: Ability to manage health-related needs will improve Outcome: Progressing   Problem: Clinical Measurements: Goal: Ability to maintain clinical measurements within normal limits will improve Outcome: Progressing Goal: Will remain free from infection Outcome: Progressing Goal: Diagnostic test results will improve Outcome: Progressing Goal: Respiratory complications will improve Outcome: Progressing Goal: Cardiovascular complication will be avoided Outcome: Progressing   Problem: Activity: Goal: Risk for activity intolerance will decrease Outcome: Progressing   Problem: Nutrition: Goal: Adequate nutrition will be maintained Outcome: Progressing   Problem: Coping: Goal: Level of anxiety will decrease Outcome: Progressing   Problem: Elimination: Goal: Will not experience complications related to bowel motility Outcome: Progressing Goal: Will not experience complications related to urinary retention Outcome: Progressing   Problem: Pain Managment: Goal: General experience of comfort will improve and/or be controlled Outcome: Progressing   Problem: Safety: Goal: Ability to remain free from injury will improve Outcome: Progressing   Problem: Skin Integrity: Goal: Risk for impaired skin integrity will decrease Outcome: Progressing   Problem: Activity: Goal: Ability to tolerate increased activity will improve Outcome: Progressing   Problem: Clinical Measurements: Goal: Ability to maintain a body temperature in the normal range will improve Outcome: Progressing   Problem: Respiratory: Goal: Ability to maintain adequate ventilation will improve Outcome: Progressing Goal: Ability to maintain a clear airway  will improve Outcome: Progressing   Problem: Education: Goal: Ability to describe self-care measures that may prevent or decrease complications (Diabetes Survival Skills Education) will improve Outcome: Progressing Goal: Individualized Educational Video(s) Outcome: Progressing   Problem: Coping: Goal: Ability to adjust to condition or change in health will improve Outcome: Progressing   Problem: Fluid Volume: Goal: Ability to maintain a balanced intake and output will improve Outcome: Progressing   Problem: Health Behavior/Discharge Planning: Goal: Ability to identify and utilize available resources and services will improve Outcome: Progressing Goal: Ability to manage health-related needs will improve Outcome: Progressing   Problem: Metabolic: Goal: Ability to maintain appropriate glucose levels will improve Outcome: Progressing   Problem: Nutritional: Goal: Maintenance of adequate nutrition will improve Outcome: Progressing Goal: Progress toward achieving an optimal weight will improve Outcome: Progressing   Problem: Skin Integrity: Goal: Risk for impaired skin integrity will decrease Outcome: Progressing   Problem: Tissue Perfusion: Goal: Adequacy of tissue perfusion will improve Outcome: Progressing   Problem: Activity: Goal: Ability to tolerate increased activity will improve Outcome: Progressing   Problem: Respiratory: Goal: Ability to maintain a clear airway and adequate ventilation will improve Outcome: Progressing   Problem: Role Relationship: Goal: Method of communication will improve Outcome: Progressing   Problem: Fluid Volume: Goal: Hemodynamic stability will improve Outcome: Progressing   Problem: Clinical Measurements: Goal: Diagnostic test results will improve Outcome: Progressing Goal: Signs and symptoms of infection will decrease Outcome: Progressing   Problem: Respiratory: Goal: Ability to maintain adequate ventilation will  improve Outcome: Progressing

## 2023-11-02 NOTE — Plan of Care (Signed)
  Problem: Health Behavior/Discharge Planning: Goal: Ability to manage health-related needs will improve Outcome: Progressing   Problem: Clinical Measurements: Goal: Ability to maintain clinical measurements within normal limits will improve Outcome: Progressing   Problem: Clinical Measurements: Goal: Will remain free from infection Outcome: Progressing   Problem: Activity: Goal: Risk for activity intolerance will decrease Outcome: Progressing   Problem: Activity: Goal: Ability to tolerate increased activity will improve Outcome: Progressing

## 2023-11-02 NOTE — Progress Notes (Signed)
 PROGRESS NOTE    KRESTON AHRENDT  ZOX:[id number] DOB: 27-Dec-1940 DOA: 10/05/2023 PCP: Helaine Llanos, MD    Brief Narrative:  83 y.o male with significant PMH of OSA, GERD, Obesity, HTN, Dysphagia: EGD 03/2022 with food in upper esophagus complicated by aspiration event, cardiac arrest with round of CPR, and post resuscitation EGD with concern for lack of peristalsis. Prior EGD 08/2020 with note of abnormal cricopharyngeus, decrease in motility in esophagus, and spastic LES who presented to the ED from with hypoxia, fever and generalized weakness.  Patient developed acute respiratory failure requiring intubation due to aspiration pneumonia, he was treated with broad-spectrum antibiotics and managed in ICU. Condition had improved, extubated 5/15, but has significant agitation required brief course of Precedex . 5/10: Admit to [facility] service with sepsis due to Aspiration Pneumonia.  Course complicated by Acute Respiratory Failure due to Aspiration of vomitus requiring s/p cardiac arrest  transfer to ICU and intubation.  5/11 flexible bronchoscopy done by critical care team 5/12 remains intubated 5/13 remains on vent, s/p PEG tube, +vomiting last night, failed SAT/SBT 5/14 extubated successfully but with severe delirium 5/20.  Botulinum toxin injection into the lower esophageal sphincter by Dr. Ole Berkeley   5/21 esophagram showing diffusely distended esophagus with distal obstruction and severe dysmotility suggestive of achalasia. 5/22.  Advised I would not give him any food and will continue PEG feeding he can try liquids to see if that goes down but still high risk for aspiration.  Patient had a fall on the evening of 5/22. 5/23.  Patient feels okay.  Awaken from sleep.  Did not offer any complaints.  Awaiting insurance authorization for rehab. 5/24.  Patient feeling okay.  States he is doing okay with the liquid diet.  Still feels weak. 5/25.  Patient pulled out PEG tube this morning.  Foley catheter  placed in the stoma.  GI able to place a another PEG tube and remove the Foley. 5/26.  Creatinine up at 1.83.  Continue IV fluid hydration.  Patient started having pain after tube feeding started.  CT scan showed that the PEG tube was not in the correct place.  Notified gastroenterology.  Started empiric antibiotics. 5/27.  Patient was taken urgently to the operating room by Dr. Cornel Diesel because the patient had fever and elevated white count with suspected intra-abdominal sepsis. 5/29: Agitation/sundowning noted around shift change 5/28.  Suspect hospital-acquired delirium.  Received doses of atypical antipsychotics.  Much more calm, awake, alert in AM. 6/1: Foley discontinued yesterday.  Has some AKI.  Has been urine.  Mental status improved. 6/2: Kidney function improving, hypernatremia improving 6/3: Lethargy/confusion noted by PT today  Assessment & Plan:   Principal Problem:   Severe sepsis (HCC) Active Problems:   Gastrostomy complication (HCC)   Dysphagia   Achalasia of esophagus   AKI (acute kidney injury) (HCC)   Acute delirium   Acute respiratory failure with hypoxia and hypercapnia (HCC)   Essential hypertension, benign   GERD (gastroesophageal reflux disease)   Multifocal pneumonia   History of dysphagia   Aspiration pneumonia (HCC)   Obesity (BMI 30-39.9)   Generalized weakness   Generalized abdominal pain   Malnutrition of moderate degree   Severe sepsis (HCC) Recurrence with different problem.  Intra-abdominal infection secondary to PEG tube not being in the right place.  Elevated white count and fever and acute kidney injury.  Patient brought emergently to the operating room on 5/27 by Dr. Cornel Diesel.   Plan: Continue tube feeds N.p.o. for  the foreseeable future Continue IV Zosyn  for now, can de-escalate to oral regimen if patient leaves the hospital prior to completion of a total 2-week course of antibiotic Staple removal 6/6 or later Given up-trending wbcs will repeat  CT scan of abdomen/pelvis  Gastrostomy complication (HCC) Patient self discontinued PEG tube and malpositioning likely resulted in some intra-abdominal sepsis.  Tube feeds were held.  Restarted 5/28.  Continue at goal rate. CT today as above   AKI (acute kidney injury) (HCC) Persists, renal u/s shows stable mild hydro, bladder scan today neg, doesn't appear he has had contrast here, uop adequate, not on nephrotoxic agents - will re-start fluids  Hypernatremia Will increase free water  flushes from 250 q4 to 200 q2   Achalasia of esophagus Status post botulinum toxin injection on 5/20.   Esophagram showing dilated esophagus distal obstruction. Per GI esophagram continues to demonstrate severe dysmotility with dilated esophagus.  Consistent with end-stage esophageal disease.  Patient is n.p.o. indefinitely.  Should only be fed via PEG tube considering the risk of aspiration. Will need outpatient referral to tertiary advanced GI for consideration of POEMS procedure.  Recommend this only after a few weeks recovery period.  Discussed recommendations with patient's granddaughter via phone   Dysphagia Likely secondary to achalasia.  Patient had Botox  injection of the lower esophageal sphincter due to lower esophageal sphincter hypertension causing the patient not being able to swallow and empty the esophagus of his ingested food.  Food was in the esophagus on the previous EGD.  NPO for forseeable future  Acute delirium Agitation/sundowning Plan: Continue as needed Haldol  for severe agitation.  Granddaughter has asked to remove prn antipsychotics   Acute respiratory failure with hypoxia and hypercapnia (HCC) Pneumonia Now resolved   Essential hypertension, benign Bp wnl off meds   GERD (gastroesophageal reflux disease) Continue PPI   Generalized weakness Therapy evaluations as tolerated Will benefit from placement to skilled nursing facility, family is deciding on a facility    Obesity (BMI 30-39.9) BMI 33.16 Complicating factor in overall care and prognosis    DVT prophylaxis: lovenox  Code Status: Full Family Communication:  granddaughter brittany updated telephonically 6/7 Disposition Plan: Status is: Inpatient Remains inpatient appropriate because: Multiple acute issues as above   Level of care: Progressive  Consultants:  General surgery  Procedures:  Ex lap  Antimicrobials: Zosyn    Subjective: Seen and examined. Remains confused.   Objective: Vitals:   11/02/23 0320 11/02/23 0325 11/02/23 0804 11/02/23 1157  BP:  109/70 (!) 114/49 113/73  Pulse:  78 68 78  Resp:  (!) 24 18 18   Temp:  97.7 F (36.5 C) 97.9 F (36.6 C) 98.2 F (36.8 C)  TempSrc:  Oral    SpO2:  92% 100% 90%  Weight: 98.1 kg     Height:        Intake/Output Summary (Last 24 hours) at 11/02/2023 1555 Last data filed at 11/02/2023 1501 Gross per 24 hour  Intake 16109 ml  Output 1700 ml  Net 8718 ml   Filed Weights   10/31/23 0500 11/01/23 0511 11/02/23 0320  Weight: 96.5 kg 97 kg 98.1 kg    Examination:  General exam: Appears chronically ill Respiratory system: Coarse breath sounds, normal WOB, RA Cardiovascular system: S1-S2, RRR  Gastrointestinal system: Soft, NT ND, + PEG, abdominal binder in place. Central nervous system: Alert moving all 4 Extremities: warm, no edema  Skin: No visible rashes, lesions or ulcers Psychiatry: Judgement and insight appear impaired. Mood & affect confused.  Data Reviewed: I have personally reviewed following labs and imaging studies  CBC: Recent Labs  Lab 10/28/23 0408 10/29/23 0644 10/30/23 0518 10/31/23 0218 11/02/23 0357  WBC 7.7 10.6* 13.5* 16.7* 17.2*  NEUTROABS 5.1 7.6 10.1* 11.9*  --   HGB 9.9* 10.6* 9.9* 9.4* 8.7*  HCT 30.0* 32.0* 30.3* 30.1* 26.9*  MCV 95.8 97.0 97.7 100.0 98.2  PLT 314 314 256 292 332   Basic Metabolic Panel: Recent Labs  Lab 10/27/23 0452 10/28/23 0408 10/29/23 0644  10/30/23 0518 10/31/23 0218 11/01/23 0954 11/02/23 0357  NA 147*   < > 144 145 149* 150* 146*  K 3.6   < > 4.4 4.1 4.2 4.1 4.1  CL 112*   < > 108 111 114* 115* 110  CO2 27   < > 26 27 26 26 28   GLUCOSE 131*   < > 122* 125* 132* 153* 129*  BUN 22   < > 36* 42* 45* 51* 56*  CREATININE 2.01*   < > 2.15* 1.97* 1.66* 1.77* 2.04*  CALCIUM 7.7*   < > 7.8* 7.7* 7.7* 7.8* 7.7*  MG 2.3  --   --   --   --   --   --   PHOS 4.0  --   --   --   --   --   --    < > = values in this interval not displayed.   GFR: Estimated Creatinine Clearance: 31.1 mL/min (A) (by C-G formula based on SCr of 2.04 mg/dL (H)). Liver Function Tests: No results for input(s): "AST", "ALT", "ALKPHOS", "BILITOT", "PROT", "ALBUMIN" in the last 168 hours.  No results for input(s): "LIPASE", "AMYLASE" in the last 168 hours. No results for input(s): "AMMONIA" in the last 168 hours. Coagulation Profile: No results for input(s): "INR", "PROTIME" in the last 168 hours.  Cardiac Enzymes: No results for input(s): "CKTOTAL", "CKMB", "CKMBINDEX", "TROPONINI" in the last 168 hours. BNP (last 3 results) No results for input(s): "PROBNP" in the last 8760 hours. HbA1C: No results for input(s): "HGBA1C" in the last 72 hours. CBG: Recent Labs  Lab 11/01/23 2041 11/01/23 2312 11/02/23 0322 11/02/23 0827 11/02/23 1200  GLUCAP 139* 106* 113* 126* 108*   Lipid Profile: No results for input(s): "CHOL", "HDL", "LDLCALC", "TRIG", "CHOLHDL", "LDLDIRECT" in the last 72 hours. Thyroid  Function Tests: No results for input(s): "TSH", "T4TOTAL", "FREET4", "T3FREE", "THYROIDAB" in the last 72 hours. Anemia Panel: No results for input(s): "VITAMINB12", "FOLATE", "FERRITIN", "TIBC", "IRON", "RETICCTPCT" in the last 72 hours. Sepsis Labs: No results for input(s): "PROCALCITON", "LATICACIDVEN" in the last 168 hours.   No results found for this or any previous visit (from the past 240 hours).        Radiology Studies: US   RENAL Result Date: 11/02/2023 CLINICAL DATA:  Acute renal insufficiency. EXAM: RENAL / URINARY TRACT ULTRASOUND COMPLETE COMPARISON:  CT abdomen pelvis dated 10/22/2023. FINDINGS: Right Kidney: Renal measurements: 12.8 x 6.0 x 5.3 cm = volume: 213 mL. Normal echogenicity. There is mild right hydronephrosis. No shadowing stone. Left Kidney: Renal measurements: 12.8 x 7.0 x 4.9 cm = volume: 233 mL. Normal echogenicity. Mild left hydronephrosis. No shadowing stone. Bladder: Appears normal for degree of bladder distention. Other: None. IMPRESSION: Mild bilateral hydronephrosis.  No shadowing stone. Electronically Signed   By: Angus Bark M.D.   On: 11/02/2023 10:12          Scheduled Meds:  Chlorhexidine  Gluconate Cloth  6 each Topical Q0600   enoxaparin  (LOVENOX ) injection  40 mg Subcutaneous Q24H   feeding supplement (PROSource TF20)  60 mL Per Tube BID   free water   200 mL Per Tube Q2H   gentamicin  ointment   Topical TID   insulin  aspart  0-9 Units Subcutaneous Q4H   mouth rinse  15 mL Mouth Rinse 4 times per day   pantoprazole  (PROTONIX ) IV  40 mg Intravenous Q24H   Continuous Infusions:  feeding supplement (OSMOLITE 1.5 CAL) 60 mL/hr at 11/02/23 1501     LOS: 28 days      Raymonde Calico, MD [facility]   If 7PM-7AM, please contact night-coverage  11/02/2023, 3:55 PM

## 2023-11-03 DIAGNOSIS — A419 Sepsis, unspecified organism: Secondary | ICD-10-CM | POA: Diagnosis not present

## 2023-11-03 DIAGNOSIS — R6520 Severe sepsis without septic shock: Secondary | ICD-10-CM | POA: Diagnosis not present

## 2023-11-03 LAB — BASIC METABOLIC PANEL WITH GFR
Anion gap: 9 (ref 5–15)
BUN: 59 mg/dL — ABNORMAL HIGH (ref 8–23)
CO2: 25 mmol/L (ref 22–32)
Calcium: 7.6 mg/dL — ABNORMAL LOW (ref 8.9–10.3)
Chloride: 104 mmol/L (ref 98–111)
Creatinine, Ser: 2.05 mg/dL — ABNORMAL HIGH (ref 0.61–1.24)
GFR, Estimated: 31 mL/min — ABNORMAL LOW (ref >60)
Glucose, Bld: 121 mg/dL — ABNORMAL HIGH (ref 70–99)
Potassium: 4.1 mmol/L (ref 3.5–5.1)
Sodium: 138 mmol/L (ref 135–145)

## 2023-11-03 LAB — CBC
HCT: 24.6 % — ABNORMAL LOW (ref 39.0–52.0)
Hemoglobin: 8 g/dL — ABNORMAL LOW (ref 13.0–17.0)
MCH: 31.3 pg (ref 26.0–34.0)
MCHC: 32.5 g/dL (ref 30.0–36.0)
MCV: 96.1 fL (ref 80.0–100.0)
Platelets: 304 10*3/uL (ref 150–400)
RBC: 2.56 MIL/uL — ABNORMAL LOW (ref 4.22–5.81)
RDW: 13.1 % (ref 11.5–15.5)
WBC: 15.4 10*3/uL — ABNORMAL HIGH (ref 4.0–10.5)
nRBC: 0 % (ref 0.0–0.2)

## 2023-11-03 LAB — PROCALCITONIN: Procalcitonin: 0.19 ng/mL

## 2023-11-03 LAB — GLUCOSE, CAPILLARY
Glucose-Capillary: 103 mg/dL — ABNORMAL HIGH (ref 70–99)
Glucose-Capillary: 102 mg/dL — ABNORMAL HIGH (ref 70–99)
Glucose-Capillary: 124 mg/dL — ABNORMAL HIGH (ref 70–99)
Glucose-Capillary: 100 mg/dL — ABNORMAL HIGH (ref 70–99)
Glucose-Capillary: 108 mg/dL — ABNORMAL HIGH (ref 70–99)

## 2023-11-03 MED ORDER — FREE WATER
200.0000 mL | Status: DC
  Administered 2023-11-03 – 2023-11-04 (×9): 200 mL

## 2023-11-03 MED ORDER — DEXTROSE-SODIUM CHLORIDE 5-0.45 % IV SOLN: INTRAVENOUS | Status: AC

## 2023-11-03 NOTE — Progress Notes (Addendum)
 PROGRESS NOTE    Robert Vega  ZOX:[id number] DOB: Mar 20, 1941 DOA: 10/05/2023 PCP: Helaine Llanos, MD    Brief Narrative:  83 y.o male with significant PMH of OSA, GERD, Obesity, HTN, Dysphagia: EGD 03/2022 with food in upper esophagus complicated by aspiration event, cardiac arrest with round of CPR, and post resuscitation EGD with concern for lack of peristalsis. Prior EGD 08/2020 with note of abnormal cricopharyngeus, decrease in motility in esophagus, and spastic LES who presented to the ED from with hypoxia, fever and generalized weakness.  Patient developed acute respiratory failure requiring intubation due to aspiration pneumonia, he was treated with broad-spectrum antibiotics and managed in ICU. Condition had improved, extubated 5/15, but has significant agitation required brief course of Precedex . 5/10: Admit to [facility] service with sepsis due to Aspiration Pneumonia.  Course complicated by Acute Respiratory Failure due to Aspiration of vomitus requiring s/p cardiac arrest  transfer to ICU and intubation.  5/11 flexible bronchoscopy done by critical care team 5/12 remains intubated 5/13 remains on vent, s/p PEG tube, +vomiting last night, failed SAT/SBT 5/14 extubated successfully but with severe delirium 5/20.  Botulinum toxin injection into the lower esophageal sphincter by Dr. Ole Berkeley   5/21 esophagram showing diffusely distended esophagus with distal obstruction and severe dysmotility suggestive of achalasia. 5/22.  Advised I would not give him any food and will continue PEG feeding he can try liquids to see if that goes down but still high risk for aspiration.  Patient had a fall on the evening of 5/22. 5/23.  Patient feels okay.  Awaken from sleep.  Did not offer any complaints.  Awaiting insurance authorization for rehab. 5/24.  Patient feeling okay.  States he is doing okay with the liquid diet.  Still feels weak. 5/25.  Patient pulled out PEG tube this morning.  Foley catheter  placed in the stoma.  GI able to place a another PEG tube and remove the Foley. 5/26.  Creatinine up at 1.83.  Continue IV fluid hydration.  Patient started having pain after tube feeding started.  CT scan showed that the PEG tube was not in the correct place.  Notified gastroenterology.  Started empiric antibiotics. 5/27.  Patient was taken urgently to the operating room by Dr. Cornel Diesel because the patient had fever and elevated white count with suspected intra-abdominal sepsis. 5/29: Agitation/sundowning noted around shift change 5/28.  Suspect hospital-acquired delirium.  Received doses of atypical antipsychotics.  Much more calm, awake, alert in AM. 6/1: Foley discontinued yesterday.  Has some AKI.  Has been urine.  Mental status improved. 6/2: Kidney function improving, hypernatremia improving 6/3: Lethargy/confusion noted by PT today  Assessment & Plan:   Principal Problem:   Severe sepsis (HCC) Active Problems:   Gastrostomy complication (HCC)   Dysphagia   Achalasia of esophagus   AKI (acute kidney injury) (HCC)   Acute delirium   Acute respiratory failure with hypoxia and hypercapnia (HCC)   Essential hypertension, benign   GERD (gastroesophageal reflux disease)   Multifocal pneumonia   History of dysphagia   Aspiration pneumonia (HCC)   Obesity (BMI 30-39.9)   Generalized weakness   Generalized abdominal pain   Malnutrition of moderate degree   Severe sepsis (HCC) Recurrence with different problem.  Intra-abdominal infection secondary to PEG tube not being in the right place.  Elevated white count and fever and acute kidney injury.  Patient brought emergently to the operating room on 5/27 by Dr. Cornel Diesel.   Plan: Continue tube feeds N.p.o. for  the foreseeable future Continue IV Zosyn  for now, can de-escalate to oral regimen if patient leaves the hospital prior to completion of a total 2-week course of antibiotic Staple removal 6/6 or later, will reach out to surgery  tomorrow Repeat CT scan 6/7 (leukocytosis) nothing acute  Gastrostomy complication (HCC) Patient self discontinued PEG tube and malpositioning likely resulted in some intra-abdominal sepsis.  Tube feeds were held.  Restarted 5/28.  Continue at goal rate.   AKI (acute kidney injury) (HCC) Persists, renal u/s shows stable mild hydro, bladder scan neg, doesn't appear he has had contrast here, uop adequate, not on nephrotoxic agents. May have progressed to ckd 3b - fluids re-started 6/7 will continue one more day  Hypernatremia Resolved, will need continued adjustments of FWFs   Achalasia of esophagus Status post botulinum toxin injection on 5/20.   Esophagram showing dilated esophagus distal obstruction. Per GI esophagram continues to demonstrate severe dysmotility with dilated esophagus.  Consistent with end-stage esophageal disease.  Patient is n.p.o. indefinitely.  Should only be fed via PEG tube considering the risk of aspiration. Will need outpatient referral to tertiary advanced GI for consideration of POEMS procedure.  Recommend this only after a few weeks recovery period.    Dysphagia Likely secondary to achalasia.  Patient had Botox  injection of the lower esophageal sphincter due to lower esophageal sphincter hypertension causing the patient not being able to swallow and empty the esophagus of his ingested food.  Food was in the esophagus on the previous EGD and on CT 6/7.  NPO for forseeable future  Delirium Agitation/sundowning Plan: Continue as needed Haldol  for severe agitation.  Granddaughter has asked to discontinue prn antipsychotics   Acute respiratory failure with hypoxia and hypercapnia (HCC) Pneumonia Now resolved   Essential hypertension, benign Bp wnl off meds   GERD (gastroesophageal reflux disease) Continue PPI   Generalized weakness Therapy evaluations as tolerated Will benefit from placement to skilled nursing facility, family is deciding on a facility    Obesity (BMI 30-39.9) BMI 33.16 Complicating factor in overall care and prognosis    DVT prophylaxis: lovenox  Code Status: Full Family Communication:  granddaughter brittany updated telephonically 6/8.Aaron Aas Family updated at bedside today Disposition Plan: Status is: Inpatient Remains inpatient appropriate because: Multiple acute issues as above   Level of care: Med-Surg  Consultants:  General surgery  Procedures:  Ex lap  Antimicrobials: Zosyn    Subjective: Seen and examined. Remains confused.   Objective: Vitals:   11/03/23 0353 11/03/23 0422 11/03/23 0500 11/03/23 1212  BP: (!) 70/47 (!) 106/52  120/67  Pulse: 63   75  Resp:  19  19  Temp:    98.4 F (36.9 C)  TempSrc:      SpO2: (!) 89% 91%  (!) 85%  Weight:   98.8 kg   Height:        Intake/Output Summary (Last 24 hours) at 11/03/2023 1436 Last data filed at 11/03/2023 0600 Gross per 24 hour  Intake 2481.79 ml  Output 1400 ml  Net 1081.79 ml   Filed Weights   11/01/23 0511 11/02/23 0320 11/03/23 0500  Weight: 97 kg 98.1 kg 98.8 kg    Examination:  General exam: Appears chronically ill Respiratory system: Coarse breath sounds, rales at bases Cardiovascular system: S1-S2, RRR  Gastrointestinal system: Soft, NT ND, + PEG, abdominal binder in place. Central nervous system: Alert moving all 4 Extremities: warm, no edema  Skin: No visible rashes, lesions or ulcers Psychiatry: Judgement and insight appear impaired. Mood &  affect confused.     Data Reviewed: I have personally reviewed following labs and imaging studies  CBC: Recent Labs  Lab 10/28/23 0408 10/29/23 0644 10/30/23 0518 10/31/23 0218 11/02/23 0357 11/03/23 0431  WBC 7.7 10.6* 13.5* 16.7* 17.2* 15.4*  NEUTROABS 5.1 7.6 10.1* 11.9*  --   --   HGB 9.9* 10.6* 9.9* 9.4* 8.7* 8.0*  HCT 30.0* 32.0* 30.3* 30.1* 26.9* 24.6*  MCV 95.8 97.0 97.7 100.0 98.2 96.1  PLT 314 314 256 292 332 304   Basic Metabolic Panel: Recent Labs  Lab  10/30/23 0518 10/31/23 0218 11/01/23 0954 11/02/23 0357 11/03/23 0431  NA 145 149* 150* 146* 138  K 4.1 4.2 4.1 4.1 4.1  CL 111 114* 115* 110 104  CO2 27 26 26 28 25   GLUCOSE 125* 132* 153* 129* 121*  BUN 42* 45* 51* 56* 59*  CREATININE 1.97* 1.66* 1.77* 2.04* 2.05*  CALCIUM 7.7* 7.7* 7.8* 7.7* 7.6*   GFR: Estimated Creatinine Clearance: 31.1 mL/min (A) (by C-G formula based on SCr of 2.05 mg/dL (H)). Liver Function Tests: No results for input(s): "AST", "ALT", "ALKPHOS", "BILITOT", "PROT", "ALBUMIN" in the last 168 hours.  No results for input(s): "LIPASE", "AMYLASE" in the last 168 hours. No results for input(s): "AMMONIA" in the last 168 hours. Coagulation Profile: No results for input(s): "INR", "PROTIME" in the last 168 hours.  Cardiac Enzymes: No results for input(s): "CKTOTAL", "CKMB", "CKMBINDEX", "TROPONINI" in the last 168 hours. BNP (last 3 results) No results for input(s): "PROBNP" in the last 8760 hours. HbA1C: No results for input(s): "HGBA1C" in the last 72 hours. CBG: Recent Labs  Lab 11/02/23 1558 11/02/23 1926 11/02/23 2348 11/03/23 0357 11/03/23 1209  GLUCAP 124* 114* 103* 103* 102*   Lipid Profile: No results for input(s): "CHOL", "HDL", "LDLCALC", "TRIG", "CHOLHDL", "LDLDIRECT" in the last 72 hours. Thyroid  Function Tests: No results for input(s): "TSH", "T4TOTAL", "FREET4", "T3FREE", "THYROIDAB" in the last 72 hours. Anemia Panel: No results for input(s): "VITAMINB12", "FOLATE", "FERRITIN", "TIBC", "IRON", "RETICCTPCT" in the last 72 hours. Sepsis Labs: Recent Labs  Lab 11/03/23 0431  PROCALCITON 0.19     No results found for this or any previous visit (from the past 240 hours).        Radiology Studies: CT CHEST ABDOMEN PELVIS WO CONTRAST Result Date: 11/02/2023 CLINICAL DATA:  Leukocytosis, recent dislodged feeding tube, elevated temperature EXAM: CT CHEST, ABDOMEN AND PELVIS WITHOUT CONTRAST TECHNIQUE: Multidetector CT imaging of  the chest, abdomen and pelvis was performed following the standard protocol without IV contrast. RADIATION DOSE REDUCTION: This exam was performed according to the departmental dose-optimization program which includes automated exposure control, adjustment of the mA and/or kV according to patient size and/or use of iterative reconstruction technique. COMPARISON:  10/22/2023 FINDINGS: CT CHEST FINDINGS Cardiovascular: Heart is normal size. Aorta is normal caliber. Three-vessel coronary artery disease. Mediastinum/Nodes: Esophagus is dilated and fluid-filled. This is similar to prior study. No mediastinal, hilar, or axillary adenopathy. Trachea and thyroid  unremarkable. Lungs/Pleura: Trace bilateral pleural effusions. Bronchial wall thickening noted in the lower lobes, right greater than left with airspace opacities in the lower lobes, right greater than left. This could reflect atelectasis or early pneumonia. Musculoskeletal: Chest wall soft tissues are unremarkable. No acute bony abnormality. CT ABDOMEN PELVIS FINDINGS Hepatobiliary: Small gallstone noted in the gallbladder. No biliary ductal dilatation. No focal hepatic abnormality. Pancreas: No focal abnormality or ductal dilatation. Spleen: No focal abnormality.  Normal size. Adrenals/Urinary Tract: Mild bilateral hydronephrosis and hydroureter, similar to  prior study. Urinary bladder unremarkable. No renal or adrenal suspicious lesion. Stomach/Bowel: Gastrostomy in place within the distal stomach, unremarkable. Stomach, large and small bowel grossly unremarkable. Vascular/Lymphatic: Aortic atherosclerosis. No evidence of aneurysm or adenopathy. Reproductive: No visible focal abnormality. Other: No free fluid or free air. Musculoskeletal: No acute bony abnormality. IMPRESSION: Dilated, fluid-filled esophagus may be related to dysmotility or reflux. Airway thickening in the lower lobes with airspace opacities in the lower lobes, right greater than left. This could  reflect atelectasis or pneumonia. Three vessel coronary artery disease, aortic atherosclerosis. Cholelithiasis.  No CT evidence of acute cholecystitis. Gastrostomy tube in the stomach, grossly unremarkable. Mild bilateral hydronephrosis without visible obstructing stones. Small amount of free fluid in the cul-de-sac. Electronically Signed   By: Janeece Mechanic M.D.   On: 11/02/2023 20:39   US  RENAL Result Date: 11/02/2023 CLINICAL DATA:  Acute renal insufficiency. EXAM: RENAL / URINARY TRACT ULTRASOUND COMPLETE COMPARISON:  CT abdomen pelvis dated 10/22/2023. FINDINGS: Right Kidney: Renal measurements: 12.8 x 6.0 x 5.3 cm = volume: 213 mL. Normal echogenicity. There is mild right hydronephrosis. No shadowing stone. Left Kidney: Renal measurements: 12.8 x 7.0 x 4.9 cm = volume: 233 mL. Normal echogenicity. Mild left hydronephrosis. No shadowing stone. Bladder: Appears normal for degree of bladder distention. Other: None. IMPRESSION: Mild bilateral hydronephrosis.  No shadowing stone. Electronically Signed   By: Angus Bark M.D.   On: 11/02/2023 10:12          Scheduled Meds:  Chlorhexidine  Gluconate Cloth  6 each Topical Q0600   enoxaparin  (LOVENOX ) injection  40 mg Subcutaneous Q24H   feeding supplement (PROSource TF20)  60 mL Per Tube BID   free water   200 mL Per Tube Q4H   gentamicin  ointment   Topical TID   insulin  aspart  0-9 Units Subcutaneous Q4H   mouth rinse  15 mL Mouth Rinse 4 times per day   pantoprazole  (PROTONIX ) IV  40 mg Intravenous Q24H   Continuous Infusions:  dextrose  5 % and 0.45 % NaCl 100 mL/hr at 11/03/23 0231   feeding supplement (OSMOLITE 1.5 CAL) Stopped (11/02/23 1715)     LOS: 29 days      Raymonde Calico, MD [facility]   If 7PM-7AM, please contact night-coverage  11/03/2023, 2:36 PM

## 2023-11-03 NOTE — Significant Event (Signed)
       CROSS COVER NOTE  NAME: ARISTIDE WAGGLE MRN: [id number] DOB : [birth year] ATTENDING PHYSICIAN: Janeane Mealy, MD    Date of Service   11/03/2023   HPI/Events of Note   Request for CT follow up  Interventions   Assessment/Plan:    11/02/2023   11:19 PM 11/02/2023    7:22 PM 11/02/2023    4:05 PM  Vitals with BMI  Systolic 123 113 528  Diastolic 65 60 65  Pulse 81 80 71    CT chest abd pelvis WO contrast IMPRESSION: Dilated, fluid-filled esophagus may be related to dysmotility or reflux.   Airway thickening in the lower lobes with airspace opacities in the lower lobes, right greater than left. This could reflect atelectasis or pneumonia.   Three vessel coronary artery disease, aortic atherosclerosis.   Cholelithiasis.  No CT evidence of acute cholecystitis.   Gastrostomy tube in the stomach, grossly unremarkable.   Mild bilateral hydronephrosis without visible obstructing stones.   Small amount of free fluid in the cul-de-sac.   KEEP TF ON HOLD KEEP HOB ELEVATED     Kip Peon NP [facility] Cross Cover 7pm-7am - check amion for availability Pager [phone]

## 2023-11-04 DIAGNOSIS — R6520 Severe sepsis without septic shock: Secondary | ICD-10-CM | POA: Diagnosis not present

## 2023-11-04 DIAGNOSIS — A419 Sepsis, unspecified organism: Secondary | ICD-10-CM | POA: Diagnosis not present

## 2023-11-04 LAB — COMPREHENSIVE METABOLIC PANEL WITH GFR
ALT: 72 U/L — ABNORMAL HIGH (ref 0–44)
AST: 44 U/L — ABNORMAL HIGH (ref 15–41)
Albumin: 2 g/dL — ABNORMAL LOW (ref 3.5–5.0)
Alkaline Phosphatase: 80 U/L (ref 38–126)
Anion gap: 9 (ref 5–15)
BUN: 55 mg/dL — ABNORMAL HIGH (ref 8–23)
CO2: 24 mmol/L (ref 22–32)
Calcium: 7.5 mg/dL — ABNORMAL LOW (ref 8.9–10.3)
Chloride: 106 mmol/L (ref 98–111)
Creatinine, Ser: 1.96 mg/dL — ABNORMAL HIGH (ref 0.61–1.24)
GFR, Estimated: 33 mL/min — ABNORMAL LOW (ref >60)
Glucose, Bld: 140 mg/dL — ABNORMAL HIGH (ref 70–99)
Potassium: 4 mmol/L (ref 3.5–5.1)
Sodium: 139 mmol/L (ref 135–145)
Total Bilirubin: 0.4 mg/dL (ref 0.0–1.2)
Total Protein: 6.5 g/dL (ref 6.5–8.1)

## 2023-11-04 LAB — CBC
HCT: 23.9 % — ABNORMAL LOW (ref 39.0–52.0)
Hemoglobin: 7.8 g/dL — ABNORMAL LOW (ref 13.0–17.0)
MCH: 31.7 pg (ref 26.0–34.0)
MCHC: 32.6 g/dL (ref 30.0–36.0)
MCV: 97.2 fL (ref 80.0–100.0)
Platelets: 309 10*3/uL (ref 150–400)
RBC: 2.46 MIL/uL — ABNORMAL LOW (ref 4.22–5.81)
RDW: 12.9 % (ref 11.5–15.5)
WBC: 13.2 10*3/uL — ABNORMAL HIGH (ref 4.0–10.5)
nRBC: 0 % (ref 0.0–0.2)

## 2023-11-04 LAB — GLUCOSE, CAPILLARY
Glucose-Capillary: 111 mg/dL — ABNORMAL HIGH (ref 70–99)
Glucose-Capillary: 104 mg/dL — ABNORMAL HIGH (ref 70–99)
Glucose-Capillary: 134 mg/dL — ABNORMAL HIGH (ref 70–99)
Glucose-Capillary: 132 mg/dL — ABNORMAL HIGH (ref 70–99)
Glucose-Capillary: 131 mg/dL — ABNORMAL HIGH (ref 70–99)

## 2023-11-04 MED ORDER — PANTOPRAZOLE SODIUM 40 MG IV SOLR
40.0000 mg | Freq: Two times a day (BID) | INTRAVENOUS | Status: DC
  Administered 2023-11-04 – 2023-11-07 (×5): 40 mg via INTRAVENOUS
  Filled 2023-11-04 (×5): qty 10

## 2023-11-04 MED ORDER — FREE WATER
200.0000 mL | Status: AC
  Administered 2023-11-04: 200 mL

## 2023-11-04 MED ORDER — DEXTROSE-SODIUM CHLORIDE 5-0.45 % IV SOLN: INTRAVENOUS | Status: AC

## 2023-11-04 NOTE — TOC Progression Note (Signed)
 Transition of Care [facility]) - Progression Note    Patient Details  Name: Robert Vega MRN: [id number] Date of Birth: 06-21-1940  [facility] CM/SW Contact  Loman Risk, RN Phone Number: 11/04/2023, 12:33 PM  Clinical Narrative:     Per Lindaann Requena with [facility] they have prosource No Carbs 15g protein 60 cal per 30ml packet   MD and dietician confirm this is feeding regimen is appropriate  Per MD ok to start Auth for SNF [place] with TOC has started [place] and it is pending  Granddaughter [name] updated        Expected Discharge Plan and Services                                               Social Determinants of Health (SDOH) Interventions SDOH Screenings   Food Insecurity: Patient Unable To Answer (10/06/2023)  Recent Concern: Food Insecurity - Food Insecurity Present (09/11/2023)   Received from [facility]  Housing: Patient Unable To Answer (10/06/2023)  Recent Concern: Housing - High Risk (09/11/2023)   Received from [facility]  Transportation Needs: Patient Unable To Answer (10/06/2023)  Utilities: Patient Unable To Answer (10/06/2023)  Depression (PHQ2-9): Low Risk  (05/07/2023)  Financial Resource Strain: Medium Risk (09/11/2023)   Received from [facility]  Social Connections: Unknown (10/06/2023)  Tobacco Use: Medium Risk (10/22/2023)    Readmission Risk Interventions     No data to display

## 2023-11-04 NOTE — Progress Notes (Signed)
 PROGRESS NOTE    Robert Vega  ZOX:[id number] DOB: Oct 23, 1940 DOA: 10/05/2023 PCP: Helaine Llanos, MD    Brief Narrative:  83 y.o male with significant PMH of OSA, GERD, Obesity, HTN, Dysphagia: EGD 03/2022 with food in upper esophagus complicated by aspiration event, cardiac arrest with round of CPR, and post resuscitation EGD with concern for lack of peristalsis. Prior EGD 08/2020 with note of abnormal cricopharyngeus, decrease in motility in esophagus, and spastic LES who presented to the ED from with hypoxia, fever and generalized weakness.  Patient developed acute respiratory failure requiring intubation due to aspiration pneumonia, he was treated with broad-spectrum antibiotics and managed in ICU. Condition had improved, extubated 5/15, but has significant agitation required brief course of Precedex . 5/10: Admit to [facility] service with sepsis due to Aspiration Pneumonia.  Course complicated by Acute Respiratory Failure due to Aspiration of vomitus requiring s/p cardiac arrest  transfer to ICU and intubation.  5/11 flexible bronchoscopy done by critical care team 5/12 remains intubated 5/13 remains on vent, s/p PEG tube, +vomiting last night, failed SAT/SBT 5/14 extubated successfully but with severe delirium 5/20.  Botulinum toxin injection into the lower esophageal sphincter by Dr. Ole Berkeley   5/21 esophagram showing diffusely distended esophagus with distal obstruction and severe dysmotility suggestive of achalasia. 5/22.  Advised I would not give him any food and will continue PEG feeding he can try liquids to see if that goes down but still high risk for aspiration.  Patient had a fall on the evening of 5/22. 5/23.  Patient feels okay.  Awaken from sleep.  Did not offer any complaints.  Awaiting insurance authorization for rehab. 5/24.  Patient feeling okay.  States he is doing okay with the liquid diet.  Still feels weak. 5/25.  Patient pulled out PEG tube this morning.  Foley catheter  placed in the stoma.  GI able to place a another PEG tube and remove the Foley. 5/26.  Creatinine up at 1.83.  Continue IV fluid hydration.  Patient started having pain after tube feeding started.  CT scan showed that the PEG tube was not in the correct place.  Notified gastroenterology.  Started empiric antibiotics. 5/27.  Patient was taken urgently to the operating room by Dr. Cornel Diesel because the patient had fever and elevated white count with suspected intra-abdominal sepsis. 5/29: Agitation/sundowning noted around shift change 5/28.  Suspect hospital-acquired delirium.  Received doses of atypical antipsychotics.  Much more calm, awake, alert in AM. 6/1: Foley discontinued yesterday.  Has some AKI.  Has been urine.  Mental status improved. 6/2: Kidney function improving, hypernatremia improving 6/3: Lethargy/confusion noted by PT today  Assessment & Plan:   Principal Problem:   Severe sepsis (HCC) Active Problems:   Gastrostomy complication (HCC)   Dysphagia   Achalasia of esophagus   AKI (acute kidney injury) (HCC)   Acute delirium   Acute respiratory failure with hypoxia and hypercapnia (HCC)   Essential hypertension, benign   GERD (gastroesophageal reflux disease)   Multifocal pneumonia   History of dysphagia   Aspiration pneumonia (HCC)   Obesity (BMI 30-39.9)   Generalized weakness   Generalized abdominal pain   Malnutrition of moderate degree   Melena New today. Hgb has drifted to 7.8 from 9s several days ago. - stop lovenox  ppx - increase ppi IV to bid - npo midnight - gi possible egd tomorrow  Severe sepsis (HCC) Recurrence with different problem.  Intra-abdominal infection secondary to PEG tube not being in the right  place.  Elevated white count and fever and acute kidney injury.  Patient brought emergently to the operating room on 5/27 by Dr. Cornel Diesel.   Plan: Continue tube feeds N.p.o. for the foreseeable future Continue IV Zosyn  for now, can de-escalate to oral  regimen if patient leaves the hospital prior to completion of a total 2-week course of antibiotic Staple removal 6/6 or later, will reach out to surgery tomorrow Repeat CT scan 6/7 (leukocytosis) nothing acute  Gastrostomy complication (HCC) Patient self discontinued PEG tube and malpositioning likely resulted in some intra-abdominal sepsis.  Tube feeds were held.  Restarted 5/28.  Continue at goal rate.   AKI (acute kidney injury) (HCC) with possible progression to ckd 3b Persists, renal u/s shows stable mild hydro, bladder scan neg, doesn't appear he has had contrast here, uop adequate, not on nephrotoxic agents. May have progressed to ckd 3b - fluids re-started 6/7 will continue   Hypernatremia Resolved, will need continued adjustments of FWFs   Achalasia of esophagus Status post botulinum toxin injection on 5/20.   Esophagram showing dilated esophagus distal obstruction. Per GI esophagram continues to demonstrate severe dysmotility with dilated esophagus.  Consistent with end-stage esophageal disease.  Patient is n.p.o. indefinitely.  Should only be fed via PEG tube considering the risk of aspiration. Will need outpatient referral to tertiary advanced GI for consideration of POEMS procedure.  Recommend this only after a few weeks recovery period.    Dysphagia Likely secondary to achalasia.  Patient had Botox  injection of the lower esophageal sphincter due to lower esophageal sphincter hypertension causing the patient not being able to swallow and empty the esophagus of his ingested food.  Food was in the esophagus on the previous EGD and on CT 6/7.  NPO for forseeable future  Delirium Agitation/sundowning Plan: Continue as needed Haldol  for severe agitation.  Granddaughter has asked to discontinue prn antipsychotics   Acute respiratory failure with hypoxia and hypercapnia (HCC) Pneumonia Now resolved   Essential hypertension, benign Bp wnl off meds   GERD (gastroesophageal  reflux disease) Continue PPI   Generalized weakness Therapy evaluations as tolerated Will benefit from placement to skilled nursing facility, family is deciding on a facility   Obesity (BMI 30-39.9) BMI 33.16 Complicating factor in overall care and prognosis    DVT prophylaxis: lovenox  Code Status: Full Family Communication:  granddaughter brittany updated telephonically 6/9 Disposition Plan: Status is: Inpatient Remains inpatient appropriate because: Multiple acute issues as above   Level of care: Med-Surg  Consultants:  General surgery (signed off) GI  Procedures:  Ex lap  Antimicrobials: Zosyn    Subjective: Seen and examined. Remains confused. Black stool today per RN  Objective: Vitals:   11/04/23 0312 11/04/23 0400 11/04/23 0807 11/04/23 1508  BP: (!) 132/59  (!) 125/58 129/67  Pulse: 81  85 74  Resp: 16  18 20   Temp: (!) 97.5 F (36.4 C)  98.1 F (36.7 C) 98.5 F (36.9 C)  TempSrc:   Oral   SpO2: 97%  97% 94%  Weight:  100.4 kg    Height:        Intake/Output Summary (Last 24 hours) at 11/04/2023 1700 Last data filed at 11/04/2023 1227 Gross per 24 hour  Intake 1633.76 ml  Output 1650 ml  Net -16.24 ml   Filed Weights   11/02/23 0320 11/03/23 0500 11/04/23 0400  Weight: 98.1 kg 98.8 kg 100.4 kg    Examination:  General exam: Appears chronically ill Respiratory system: Coarse breath sounds, rales at  bases Cardiovascular system: S1-S2, RRR  Gastrointestinal system: Soft, NT ND, + PEG, abdominal binder in place. Central nervous system: Alert moving all 4 Extremities: warm, no edema  Skin: No visible rashes, lesions or ulcers Psychiatry: Judgement and insight appear impaired. Mood & affect confused.     Data Reviewed: I have personally reviewed following labs and imaging studies  CBC: Recent Labs  Lab 10/29/23 0644 10/30/23 0518 10/31/23 0218 11/02/23 0357 11/03/23 0431 11/04/23 1251  WBC 10.6* 13.5* 16.7* 17.2* 15.4* 13.2*   NEUTROABS 7.6 10.1* 11.9*  --   --   --   HGB 10.6* 9.9* 9.4* 8.7* 8.0* 7.8*  HCT 32.0* 30.3* 30.1* 26.9* 24.6* 23.9*  MCV 97.0 97.7 100.0 98.2 96.1 97.2  PLT 314 256 292 332 304 309   Basic Metabolic Panel: Recent Labs  Lab 10/31/23 0218 11/01/23 0954 11/02/23 0357 11/03/23 0431 11/04/23 0445  NA 149* 150* 146* 138 139  K 4.2 4.1 4.1 4.1 4.0  CL 114* 115* 110 104 106  CO2 26 26 28 25 24   GLUCOSE 132* 153* 129* 121* 140*  BUN 45* 51* 56* 59* 55*  CREATININE 1.66* 1.77* 2.04* 2.05* 1.96*  CALCIUM 7.7* 7.8* 7.7* 7.6* 7.5*   GFR: Estimated Creatinine Clearance: 32.8 mL/min (A) (by C-G formula based on SCr of 1.96 mg/dL (H)). Liver Function Tests: Recent Labs  Lab 11/04/23 0445  AST 44*  ALT 72*  ALKPHOS 80  BILITOT 0.4  PROT 6.5  ALBUMIN 2.0*    No results for input(s): "LIPASE", "AMYLASE" in the last 168 hours. No results for input(s): "AMMONIA" in the last 168 hours. Coagulation Profile: No results for input(s): "INR", "PROTIME" in the last 168 hours.  Cardiac Enzymes: No results for input(s): "CKTOTAL", "CKMB", "CKMBINDEX", "TROPONINI" in the last 168 hours. BNP (last 3 results) No results for input(s): "PROBNP" in the last 8760 hours. HbA1C: No results for input(s): "HGBA1C" in the last 72 hours. CBG: Recent Labs  Lab 11/03/23 2326 11/04/23 0310 11/04/23 0812 11/04/23 1213 11/04/23 1511  GLUCAP 108* 111* 104* 134* 132*   Lipid Profile: No results for input(s): "CHOL", "HDL", "LDLCALC", "TRIG", "CHOLHDL", "LDLDIRECT" in the last 72 hours. Thyroid  Function Tests: No results for input(s): "TSH", "T4TOTAL", "FREET4", "T3FREE", "THYROIDAB" in the last 72 hours. Anemia Panel: No results for input(s): "VITAMINB12", "FOLATE", "FERRITIN", "TIBC", "IRON", "RETICCTPCT" in the last 72 hours. Sepsis Labs: Recent Labs  Lab 11/03/23 0431  PROCALCITON 0.19     No results found for this or any previous visit (from the past 240 hours).        Radiology  Studies: CT CHEST ABDOMEN PELVIS WO CONTRAST Result Date: 11/02/2023 CLINICAL DATA:  Leukocytosis, recent dislodged feeding tube, elevated temperature EXAM: CT CHEST, ABDOMEN AND PELVIS WITHOUT CONTRAST TECHNIQUE: Multidetector CT imaging of the chest, abdomen and pelvis was performed following the standard protocol without IV contrast. RADIATION DOSE REDUCTION: This exam was performed according to the departmental dose-optimization program which includes automated exposure control, adjustment of the mA and/or kV according to patient size and/or use of iterative reconstruction technique. COMPARISON:  10/22/2023 FINDINGS: CT CHEST FINDINGS Cardiovascular: Heart is normal size. Aorta is normal caliber. Three-vessel coronary artery disease. Mediastinum/Nodes: Esophagus is dilated and fluid-filled. This is similar to prior study. No mediastinal, hilar, or axillary adenopathy. Trachea and thyroid  unremarkable. Lungs/Pleura: Trace bilateral pleural effusions. Bronchial wall thickening noted in the lower lobes, right greater than left with airspace opacities in the lower lobes, right greater than left. This could reflect  atelectasis or early pneumonia. Musculoskeletal: Chest wall soft tissues are unremarkable. No acute bony abnormality. CT ABDOMEN PELVIS FINDINGS Hepatobiliary: Small gallstone noted in the gallbladder. No biliary ductal dilatation. No focal hepatic abnormality. Pancreas: No focal abnormality or ductal dilatation. Spleen: No focal abnormality.  Normal size. Adrenals/Urinary Tract: Mild bilateral hydronephrosis and hydroureter, similar to prior study. Urinary bladder unremarkable. No renal or adrenal suspicious lesion. Stomach/Bowel: Gastrostomy in place within the distal stomach, unremarkable. Stomach, large and small bowel grossly unremarkable. Vascular/Lymphatic: Aortic atherosclerosis. No evidence of aneurysm or adenopathy. Reproductive: No visible focal abnormality. Other: No free fluid or free air.  Musculoskeletal: No acute bony abnormality. IMPRESSION: Dilated, fluid-filled esophagus may be related to dysmotility or reflux. Airway thickening in the lower lobes with airspace opacities in the lower lobes, right greater than left. This could reflect atelectasis or pneumonia. Three vessel coronary artery disease, aortic atherosclerosis. Cholelithiasis.  No CT evidence of acute cholecystitis. Gastrostomy tube in the stomach, grossly unremarkable. Mild bilateral hydronephrosis without visible obstructing stones. Small amount of free fluid in the cul-de-sac. Electronically Signed   By: Janeece Mechanic M.D.   On: 11/02/2023 20:39          Scheduled Meds:  feeding supplement (PROSource TF20)  60 mL Per Tube BID   free water   200 mL Per Tube Q4H   gentamicin  ointment   Topical TID   insulin  aspart  0-9 Units Subcutaneous Q4H   mouth rinse  15 mL Mouth Rinse 4 times per day   pantoprazole  (PROTONIX ) IV  40 mg Intravenous Q12H   Continuous Infusions:  dextrose  5 % and 0.45 % NaCl 100 mL/hr at 11/04/23 1641   feeding supplement (OSMOLITE 1.5 CAL) 60 mL/hr at 11/04/23 0546     LOS: 30 days      Raymonde Calico, MD [facility]   If 7PM-7AM, please contact night-coverage  11/04/2023, 5:00 PM

## 2023-11-04 NOTE — Evaluation (Addendum)
 Occupational Therapy Re-Evaluation Patient Details Name: Robert Vega MRN: [id number] DOB: [birth year] Today's Date: 11/04/2023   History of Present Illness   83 y.o male with significant PMH of OSA, GERD, Obesity, HTN, Dysphagia: EGD 03/2022 with food in upper esophagus complicated by aspiration event, cardiac arrest with round of CPR, and post resuscitation EGD with concern for lack of peristalsis. Pt presented to ED on 10/05/2023 with hypoxia, fever and generalized weakness; developed acute respiratory failure requiring intubation 5/10 due to aspiration pneumonia, extubated 5/15, but had significant agitation required brief course of Precedex ; pt now s/p exploratory laparotomy with closure of gastrotomy and insertion of gastrostomy tube on 10/22/23     Clinical Impressions Pt seen for OT re-evaluation, overlapping with PT to optimize safety with ADL mobility. Pt received in bed, sitters present (left for brief break while pt worked with therapy). Pt engages in surface level conversation with OT, denies pain. Pt tolerated session well, requiring MAX A +2 for bed mobility, static sitting balance initially requiring MIN A improving to SBA-CGA. Able to maintain static balance with SBA-CGA while requiring MAX A for washing his back. Initially requiring increased assist for first STS transfer, pt improved to MIN A +2. Multimodal cues during transitional movements. Pt continues to benefit from skilled OT services. Goals reviewed and updated to reflect progress to date.    If plan is discharge home, recommend the following:   Assistance with cooking/housework;Help with stairs or ramp for entrance;Supervision due to cognitive status;A lot of help with bathing/dressing/bathroom;Two people to help with walking and/or transfers     Functional Status Assessment   Patient has had a recent decline in their functional status and demonstrates the ability to make significant improvements in function in a  reasonable and predictable amount of time.     Equipment Recommendations   Other (comment) (defer)     Recommendations for Other Services         Precautions/Restrictions   Precautions Precautions: Fall Recall of Precautions/Restrictions: Impaired Precaution/Restrictions Comments: abdominal binder Restrictions Weight Bearing Restrictions Per Provider Order: No     Mobility Bed Mobility Overal bed mobility: Needs Assistance Bed Mobility: Supine to Sit, Sit to Supine     Supine to sit: Max assist, Total assist, +2 for physical assistance Sit to supine: Max assist, Total assist, +2 for physical assistance   General bed mobility comments: verbal cues for technique, sequencing.    Transfers Overall transfer level: Needs assistance Equipment used: Rolling walker (2 wheels) Transfers: Sit to/from Stand Sit to Stand: Min assist, Max assist, +2 physical assistance           General transfer comment: 2 standing bouts performed with increased independence with second stand. cues for anterior weight shifting and lift off      Balance Overall balance assessment: Needs assistance Sitting-balance support: Feet supported Sitting balance-Leahy Scale: Fair Sitting balance - Comments: initial fair- requiring CGA-MIN A, improving to SBA-CGA for static sitting balance   Standing balance support: Bilateral upper extremity supported, Reliant on assistive device for balance Standing balance-Leahy Scale: Poor Standing balance comment: up to Min A +2 person for safety                           ADL either performed or assessed with clinical judgement   ADL Overall ADL's : Needs assistance/impaired         Upper Body Bathing: Moderate assistance;Sitting;Maximal assistance  Lower Body Dressing: Bed level;Maximal assistance Lower Body Dressing Details (indicate cue type and reason): don socks                     Vision          Perception         Praxis         Pertinent Vitals/Pain Pain Assessment Pain Assessment: No/denies pain     Extremity/Trunk Assessment Upper Extremity Assessment Upper Extremity Assessment: Generalized weakness   Lower Extremity Assessment Lower Extremity Assessment: Generalized weakness       Communication Communication Communication: Impaired Factors Affecting Communication: Reduced clarity of speech   Cognition Arousal: Alert Behavior During Therapy: Impulsive Cognition: Cognition impaired     Awareness: Intellectual awareness impaired, Online awareness impaired Memory impairment (select all impairments): Short-term memory Attention impairment (select first level of impairment): Focused attention Executive functioning impairment (select all impairments): Sequencing, Problem solving, Reasoning                   Following commands: Impaired Following commands impaired: Follows one step commands with increased time, Follows one step commands inconsistently     Cueing  General Comments   Cueing Techniques: Verbal cues;Tactile cues;Gestural cues  activity tolerance limited by fatigue. mild dyspnea with exertion. incontinent of bowel with standing   Exercises     Shoulder Instructions      Home Living Family/patient expects to be discharged to:: Private residence Living Arrangements: Alone Available Help at Discharge: Family;Available 24 hours/day Type of Home: House Home Access: Stairs to enter [employer] of Steps: 2   Home Layout: One level     Bathroom Shower/Tub: Tub/shower unit         Home Equipment: None   Additional Comments: home setup per granddaughter in room. reports pt has girlfriend who could stay 24/7 or pt could go to her house.      Prior Functioning/Environment Prior Level of Function : Independent/Modified Independent;History of Falls (last six months)             Mobility Comments: endorses falls hx,  has fallen in garden and on front steps      OT Problem List: Decreased strength;Decreased activity tolerance;Decreased safety awareness;Impaired balance (sitting and/or standing);Decreased knowledge of use of DME or AE;Decreased cognition   OT Treatment/Interventions: Self-care/ADL training;Therapeutic exercise;Therapeutic activities;Energy conservation;DME and/or AE instruction;Patient/family education;Balance training;Cognitive remediation/compensation      OT Goals(Current goals can be found in the care plan section)   Acute Rehab OT Goals Patient Stated Goal: get better OT Goal Formulation: Patient unable to participate in goal setting Time For Goal Achievement: 11/18/23 Potential to Achieve Goals: Fair ADL Goals Pt Will Perform Lower Body Dressing: with mod assist;with min assist;sit to/from stand Pt Will Transfer to Toilet: with mod assist;bedside commode;stand pivot transfer   OT Frequency:  Min 2X/week    Co-evaluation PT/OT/SLP Co-Evaluation/Treatment: Yes Reason for Co-Treatment: To address functional/ADL transfers;Necessary to address cognition/behavior during functional activity PT goals addressed during session: Mobility/safety with mobility OT goals addressed during session: ADL's and self-care      AM-PAC OT "6 Clicks" Daily Activity     Outcome Measure Help from another person eating meals?: Total Help from another person taking care of personal grooming?: A Lot Help from another person toileting, which includes using toliet, bedpan, or urinal?: A Lot Help from another person bathing (including washing, rinsing, drying)?: A Lot Help from another person to put on and taking off  regular upper body clothing?: A Lot Help from another person to put on and taking off regular lower body clothing?: A Lot 6 Click Score: 11   End of Session Equipment Utilized During Treatment: Rolling walker (2 wheels) Nurse Communication: Mobility status;Other (comment) (black  BM)  Activity Tolerance: Patient tolerated treatment well Patient left: in bed;with call bell/phone within reach;with bed alarm set;with nursing/sitter in room;Other (comment) (mitts in place)  OT Visit Diagnosis: Unsteadiness on feet (R26.81);Repeated falls (R29.6);Muscle weakness (generalized) (M62.81)                Time: 5784-6962 OT Time Calculation (min): 10 min Charges:  OT General Charges $OT Visit: 1 Visit OT Evaluation $OT Re-eval: 1 Re-eval  Berenda Breaker., MPH, MS, OTR/L ascom [phone] 11/04/23, 2:05 PM

## 2023-11-04 NOTE — Progress Notes (Signed)
 Physical Therapy Treatment Patient Details Name: Robert Vega MRN: [id number] DOB: [birth year] Today's Date: 11/04/2023   History of Present Illness 83 y.o male with significant PMH of OSA, GERD, Obesity, HTN, Dysphagia: EGD 03/2022 with food in upper esophagus complicated by aspiration event, cardiac arrest with round of CPR, and post resuscitation EGD with concern for lack of peristalsis. Pt presented to ED on 10/05/2023 with hypoxia, fever and generalized weakness; developed acute respiratory failure requiring intubation 5/10 due to aspiration pneumonia, extubated 5/15, but had significant agitation required brief course of Precedex ; pt now s/p exploratory laparotomy with closure of gastrotomy and insertion of gastrostomy tube on 10/22/23    PT Comments  Patient is agreeable to PT session. He is confused but able to follow commands with multi modal cues. Standing bouts performed with increased independence with each stand. Standing tolerance limited for progression of ambulation. Patient is fatigued with activity. Recommend to continue PT to maximize independence to facilitate return to prior level of function.    If plan is discharge home, recommend the following: Two people to help with walking and/or transfers;Two people to help with bathing/dressing/bathroom;Supervision due to cognitive status   Can travel by private vehicle     No  Equipment Recommendations   (to be determined)    Recommendations for Other Services       Precautions / Restrictions Precautions Precautions: Fall Recall of Precautions/Restrictions: Impaired Precaution/Restrictions Comments: abdominal binder Restrictions Weight Bearing Restrictions Per Provider Order: No     Mobility  Bed Mobility Overal bed mobility: Needs Assistance Bed Mobility: Supine to Sit, Sit to Supine     Supine to sit: Max assist, Total assist, +2 for physical assistance Sit to supine: Max assist, Total assist, +2 for physical  assistance   General bed mobility comments: verbal cues for technique, sequencing.    Transfers Overall transfer level: Needs assistance Equipment used: Rolling walker (2 wheels) Transfers: Sit to/from Stand Sit to Stand: Min assist, Max assist, +2 physical assistance           General transfer comment: 2 standing bouts performed with increased independence with second stand. cues for anterior weight shifting and lift off    Ambulation/Gait             Pre-gait activities: 2 side step to the right with rolling walker with Min A +2 for safety     Stairs             Wheelchair Mobility     Tilt Bed    Modified Rankin (Stroke Patients Only)       Balance Overall balance assessment: Needs assistance Sitting-balance support: Feet supported Sitting balance-Leahy Scale: Fair     Standing balance support: Bilateral upper extremity supported Standing balance-Leahy Scale: Poor Standing balance comment: up to Min A +2 person for safety                            Communication Communication Communication: Impaired Factors Affecting Communication: Reduced clarity of speech  Cognition Arousal: Alert Behavior During Therapy: Impulsive   PT - Cognitive impairments: History of cognitive impairments                       PT - Cognition Comments: confused but cooperative. multi modal cues required for command following Following commands: Impaired      Cueing Cueing Techniques: Verbal cues, Tactile cues, Gestural cues  Exercises  General Comments General comments (skin integrity, edema, etc.): activity tolerance limited by fatigue. mild dyspnea with exertion. incontinent of bowel with standing      Pertinent Vitals/Pain Pain Assessment Pain Assessment: No/denies pain    Home Living                          Prior Function            PT Goals (current goals can now be found in the care plan section) Acute Rehab  PT Goals Patient Stated Goal: none stated PT Goal Formulation: Patient unable to participate in goal setting Time For Goal Achievement: 11/06/23 Potential to Achieve Goals: Fair Progress towards PT goals: Progressing toward goals    Frequency    Min 2X/week      PT Plan      Co-evaluation              AM-PAC PT "6 Clicks" Mobility   Outcome Measure  Help needed turning from your back to your side while in a flat bed without using bedrails?: A Lot Help needed moving from lying on your back to sitting on the side of a flat bed without using bedrails?: A Lot Help needed moving to and from a bed to a chair (including a wheelchair)?: A Lot Help needed standing up from a chair using your arms (e.g., wheelchair or bedside chair)?: A Lot Help needed to walk in hospital room?: A Lot Help needed climbing 3-5 steps with a railing? : Total 6 Click Score: 11    End of Session   Activity Tolerance: Patient limited by fatigue Patient left: in bed;with call bell/phone within reach;with nursing/sitter in room Nurse Communication: Mobility status PT Visit Diagnosis: Unsteadiness on feet (R26.81);Repeated falls (R29.6);Muscle weakness (generalized) (M62.81);Difficulty in walking, not elsewhere classified (R26.2);Other abnormalities of gait and mobility (R26.89);Pain     Time: 1119-1130 PT Time Calculation (min) (ACUTE ONLY): 11 min  Charges:    $Therapeutic Activity: 8-22 mins PT General Charges $$ ACUTE PT VISIT: 1 Visit                     Ozie Bo, PT, MPT    Erlene Hawks 11/04/2023, 1:32 PM

## 2023-11-04 NOTE — Progress Notes (Addendum)
 Nutrition Follow-up  DOCUMENTATION CODES:   Non-severe (moderate) malnutrition in context of acute illness/injury  INTERVENTION:   -Continue TF via PEG:    Osmolite 1.5 @ 60 ml/hr   60 ml Prosource TF20 BID (or formulary substitution)    200 ml free water  flush every 4 hours per MD   Tube feeding regimen provides 2320 kcal (100% of needs), 130 grams of protein, and 1097 ml of H2O. Total free water : 2297 ml daily  NUTRITION DIAGNOSIS:   Moderate Malnutrition related to acute illness as evidenced by mild fat depletion, mild muscle depletion, moderate muscle depletion, percent weight loss.  Ongoing  GOAL:   Patient will meet greater than or equal to 90% of their needs  Met with TF  MONITOR:   Diet advancement, Labs, Weight trends, TF tolerance, I & O's, Skin  REASON FOR ASSESSMENT:   Consult Assessment of nutrition requirement/status  ASSESSMENT:   83 y/o male with h/o GERD, esophageal dysphagia, HTN, BPH, mood disorder and hiatal hernia who is admitted with aspiration event, PNA, sepsis, cardiac arrest and dysphagia.  5/13- s/p EGD- found to have food in the entire esophagus and gastric polyps. PEG tube placed  5/14- extubated 5/15- TF re-started 5/20- s/p EGD- esophageal dilation with botox  injection 5/22- advanced to clear liquid diet 5/24- pt pulled out PEG 5/25- PEG replaced by GI 5/26- KUB revealed some stool in the rectum and paucity of air in the colon, CT revealed G-tube not in the stomach, has spillage of contrast into the peritoneal cavity and moderate amount of pneumoperitoneum, TF d/c  5/27- s/p Exploratory laparotomy, takedown of gastrocutaneous fistula and repair of gastrotomy, placement of gastrostomy tube  5/28- TF resumed  Reviewed I/O's: +484 ml x 24 hours and +5.2 L since 10/21/23   Case discussed with TOC, RN, and MD. Plan for insurance authorization for [facility].   Per GI notes, no plans to PO intake due to high aspiration intake  and end-stage esophageal disease. Pt remains NPO and receiving TF via PEG for sole source nutrition. Osmolite 1.5 infusing at 60 ml/hr via PEG. Pt tolerating well.   Per TOC, SNF does not have Prosource TF available, but does have Prosource No Carb. Informed TOC that this is an acceptable substitution. SNF regimen of Osmolite 1.5 @ 60 ml/hr with 30 ml Prosource NoCarb BID will provide: 2360 kcal (100% of needs), 120 grams of protein, and 1097 ml of H2O. Total free water : 2297 ml daily  Wt has been stable over the past week.   Medications reviewed and include protonix  and dextrose  5% and 0.45% NaCl infusion @ 100 ml/hr.   Labs reviewed: CBGS: 100-111 (inpatient orders for glycemic control are 0-9 units insulin  aspart every 4 hours).    Diet Order:   Diet Order             Diet NPO time specified  Diet effective midnight                   EDUCATION NEEDS:   No education needs have been identified at this time  Skin:  Skin Assessment: Skin Integrity Issues: Skin Integrity Issues:: Incisions Incisions: abdomen Other: skin tear to lt lower arm  Last BM:  11/03/23 (type 6)  Height:   Ht Readings from Last 1 Encounters:  10/08/23 5' 9.02" (1.753 m)    Weight:   Wt Readings from Last 1 Encounters:  11/04/23 100.4 kg    Ideal Body Weight:  72.7 kg  BMI:  Body mass index is 32.67 kg/m.  Estimated Nutritional Needs:   Kcal:  2150-2350  Protein:  105-130 grams  Fluid:  2-2.2 L    Herschel Lords, RD, LDN, CDCES Registered Dietitian III Certified Diabetes Care and Education Specialist If unable to reach this RD, please use "RD Inpatient" group chat on secure chat between hours of 8am-4 pm daily

## 2023-11-05 ENCOUNTER — Inpatient Hospital Stay: Admitting: Anesthesiology

## 2023-11-05 ENCOUNTER — Encounter: Payer: Self-pay | Admitting: Internal Medicine

## 2023-11-05 ENCOUNTER — Encounter
Admission: EM | Discharge status: Against Medical Advice | Payer: Self-pay | Source: Home / Self Care | Attending: Internal Medicine

## 2023-11-05 DIAGNOSIS — R6520 Severe sepsis without septic shock: Secondary | ICD-10-CM | POA: Diagnosis not present

## 2023-11-05 DIAGNOSIS — K2289 Other specified disease of esophagus: Secondary | ICD-10-CM | POA: Diagnosis not present

## 2023-11-05 DIAGNOSIS — K209 Esophagitis, unspecified without bleeding: Secondary | ICD-10-CM

## 2023-11-05 DIAGNOSIS — A419 Sepsis, unspecified organism: Secondary | ICD-10-CM | POA: Diagnosis not present

## 2023-11-05 DIAGNOSIS — K921 Melena: Secondary | ICD-10-CM

## 2023-11-05 HISTORY — PX: ESOPHAGOGASTRODUODENOSCOPY: SHX5428

## 2023-11-05 LAB — CBC
HCT: 23 % — ABNORMAL LOW (ref 39.0–52.0)
Hemoglobin: 7.5 g/dL — ABNORMAL LOW (ref 13.0–17.0)
MCH: 31.4 pg (ref 26.0–34.0)
MCHC: 32.6 g/dL (ref 30.0–36.0)
MCV: 96.2 fL (ref 80.0–100.0)
Platelets: 307 10*3/uL (ref 150–400)
RBC: 2.39 MIL/uL — ABNORMAL LOW (ref 4.22–5.81)
RDW: 13.2 % (ref 11.5–15.5)
WBC: 12.6 10*3/uL — ABNORMAL HIGH (ref 4.0–10.5)
nRBC: 0 % (ref 0.0–0.2)

## 2023-11-05 LAB — GLUCOSE, CAPILLARY
Glucose-Capillary: 112 mg/dL — ABNORMAL HIGH (ref 70–99)
Glucose-Capillary: 119 mg/dL — ABNORMAL HIGH (ref 70–99)
Glucose-Capillary: 120 mg/dL — ABNORMAL HIGH (ref 70–99)
Glucose-Capillary: 89 mg/dL (ref 70–99)
Glucose-Capillary: 93 mg/dL (ref 70–99)

## 2023-11-05 LAB — PREPARE RBC (CROSSMATCH)

## 2023-11-05 LAB — ABO/RH: ABO/RH(D): O POS

## 2023-11-05 MED ORDER — PROPOFOL 10 MG/ML IV BOLUS
INTRAVENOUS | Status: DC | PRN
  Administered 2023-11-05: 100 mg via INTRAVENOUS

## 2023-11-05 MED ORDER — FREE WATER
200.0000 mL | Status: DC
  Administered 2023-11-05 – 2023-11-06 (×5): 200 mL

## 2023-11-05 MED ORDER — GLYCOPYRROLATE 0.2 MG/ML IJ SOLN
INTRAMUSCULAR | Status: DC | PRN
  Administered 2023-11-05: .2 mg via INTRAVENOUS

## 2023-11-05 MED ORDER — OSMOLITE 1.5 CAL PO LIQD
1000.0000 mL | ORAL | Status: DC
  Administered 2023-11-05 – 2023-11-06 (×2): 1000 mL

## 2023-11-05 MED ORDER — SODIUM CHLORIDE 0.9% IV SOLUTION: Freq: Once | INTRAVENOUS | Status: AC

## 2023-11-05 MED ORDER — SUCCINYLCHOLINE CHLORIDE 200 MG/10ML IV SOSY
PREFILLED_SYRINGE | INTRAVENOUS | Status: DC | PRN
  Administered 2023-11-05: 100 mg via INTRAVENOUS

## 2023-11-05 MED ORDER — HALOPERIDOL LACTATE 2 MG/ML PO CONC
2.0000 mg | Freq: Three times a day (TID) | ORAL | Status: DC | PRN
  Administered 2023-11-06: 2 mg
  Filled 2023-11-05: qty 1
  Filled 2023-11-05: qty 5

## 2023-11-05 MED ORDER — EPHEDRINE SULFATE-NACL 50-0.9 MG/10ML-% IV SOSY
PREFILLED_SYRINGE | INTRAVENOUS | Status: DC | PRN
  Administered 2023-11-05: 5 mg via INTRAVENOUS

## 2023-11-05 MED ORDER — PHENYLEPHRINE 80 MCG/ML (10ML) SYRINGE FOR IV PUSH (FOR BLOOD PRESSURE SUPPORT)
PREFILLED_SYRINGE | INTRAVENOUS | Status: DC | PRN
Start: 2023-11-05 — End: 2023-11-05
  Administered 2023-11-05: 160 ug via INTRAVENOUS

## 2023-11-05 MED ORDER — PROPOFOL 10 MG/ML IV BOLUS
INTRAVENOUS | Status: AC
Start: 2023-11-05 — End: ?
  Filled 2023-11-05: qty 20

## 2023-11-05 MED ORDER — SODIUM CHLORIDE 0.9 % IV SOLN
INTRAVENOUS | Status: DC | PRN
Start: 2023-11-05 — End: 2023-11-05

## 2023-11-05 MED ORDER — LIDOCAINE HCL (CARDIAC) PF 100 MG/5ML IV SOSY
PREFILLED_SYRINGE | INTRAVENOUS | Status: DC | PRN
  Administered 2023-11-05: 100 mg via INTRAVENOUS

## 2023-11-05 NOTE — Anesthesia Preprocedure Evaluation (Addendum)
 Anesthesia Evaluation  Patient identified by MRN, date of birth, ID band Patient confused    Reviewed: Allergy & Precautions, NPO status , Patient's Chart, lab work & pertinent test results  History of Anesthesia Complications Negative for: history of anesthetic complications  Airway Mallampati: III  TM Distance: <3 FB Neck ROM: full    Dental  (+) Chipped, Poor Dentition, Missing   Pulmonary shortness of breath, pneumonia, unresolved, former smoker   breath sounds clear to auscultation+ rhonchi        Cardiovascular hypertension, (-) angina Normal cardiovascular exam     Neuro/Psych  PSYCHIATRIC DISORDERS      negative neurological ROS     GI/Hepatic Neg liver ROS,GERD  ,,  Endo/Other  negative endocrine ROS    Renal/GU Renal disease     Musculoskeletal   Abdominal   Peds  Hematology negative hematology ROS (+)   Anesthesia Other Findings Past Medical History: No date: Allergy No date: Arthritis No date: BPH (benign prostatic hypertrophy) No date: Colonic polyp No date: GERD (gastroesophageal reflux disease)     Comment:  Has LPR No date: Hypertension  Past Surgical History: No date: CATARACT EXTRACTION     Comment:  2011, other in 2013 2011: COLONOSCOPY 10/22/2023: CREATION, GASTROSTOMY, OPEN; N/A     Comment:  Procedure: CREATION, GASTROSTOMY, OPEN; GASTROSTOMY               CLOSURE;  Surgeon: Barrett Lick, MD;  Location: [facility]               ORS;  Service: General;  Laterality: N/A; 10/15/2023: ESOPHAGOGASTRODUODENOSCOPY; N/A     Comment:  Procedure: EGD (ESOPHAGOGASTRODUODENOSCOPY);  Surgeon:               Marnee Sink, MD;  Location: [facility] ENDOSCOPY;  Service:               Endoscopy;  Laterality: N/A;  WILL NEED BOTOX  08/30/2020: ESOPHAGOGASTRODUODENOSCOPY (EGD) WITH PROPOFOL ; N/A     Comment:  Procedure: ESOPHAGOGASTRODUODENOSCOPY (EGD) WITH               PROPOFOL ;  Surgeon: Luke Salaam, MD;   Location: [facility]               ENDOSCOPY;  Service: Gastroenterology;  Laterality: N/A; 04/02/2022: ESOPHAGOGASTRODUODENOSCOPY (EGD) WITH PROPOFOL ; N/A     Comment:  Procedure: ESOPHAGOGASTRODUODENOSCOPY (EGD) WITH               PROPOFOL ;  Surgeon: Luke Salaam, MD;  Location: [facility]               ENDOSCOPY;  Service: Gastroenterology;  Laterality: N/A; 10/22/2023: LAPAROTOMY; N/A     Comment:  Procedure: LAPAROTOMY, EXPLORATORY;  Surgeon: Barrett Lick, MD;  Location: [facility] ORS;  Service: General;                Laterality: N/A; 10/08/2023: PEG PLACEMENT; N/A     Comment:  Procedure: INSERTION, PEG TUBE;  Surgeon: Marnee Sink,               MD;  Location: [facility] ENDOSCOPY;  Service: Endoscopy;                Laterality: N/A; No date: skin cancer removal  BMI    Body Mass Index: 33.13 kg/m      Reproductive/Obstetrics negative OB ROS  Anesthesia Physical Anesthesia Plan  ASA: 3  Anesthesia Plan: General ETT, Rapid Sequence and Cricoid Pressure   Post-op Pain Management:    Induction: Intravenous  PONV Risk Score and Plan: Ondansetron , Dexamethasone , Midazolam  and Treatment may vary due to age or medical condition  Airway Management Planned: Oral ETT and Video Laryngoscope Planned  Additional Equipment:   Intra-op Plan:   Post-operative Plan: Extubation in OR and Possible Post-op intubation/ventilation  Informed Consent: I have reviewed the patients History and Physical, chart, labs and discussed the procedure including the risks, benefits and alternatives for the proposed anesthesia with the patient or authorized representative who has indicated his/her understanding and acceptance.     Dental Advisory Given  Plan Discussed with: Anesthesiologist, CRNA and Surgeon  Anesthesia Plan Comments: (History and phone consent from the patients grand daughter Britteny Galantine who is the patients HCPOA by phone at  [phone]  [place] daughter consented for risks of anesthesia including but not limited to:  - adverse reactions to medications - damage to eyes, teeth, lips or other oral mucosa - nerve damage due to positioning  - sore throat or hoarseness - Damage to heart, brain, nerves, lungs, other parts of body or loss of life  She voiced understanding and assent.)        Anesthesia Quick Evaluation

## 2023-11-05 NOTE — TOC Progression Note (Signed)
 Transition of Care [facility]) - Progression Note    Patient Details  Name: Robert Vega MRN: [id number] Date of Birth: 30-Jan-1941  [facility] of [facility]) CM/SW Contact  Loman Risk, RN Phone Number: 11/05/2023, 3:46 PM  Clinical Narrative:    Siegfried Dress obtained for [employer], valid through 11/06/23.  Per MD medically not ready for dc due to GI bleed overnight.  Granddaughter [name] aware         Expected Discharge Plan and Services                                               Social Determinants of Health (SDOH) Interventions SDOH Screenings   Food Insecurity: Patient Unable To Answer (10/06/2023)  Recent Concern: Food Insecurity - Food Insecurity Present (09/11/2023)   Received from [facility]  Housing: Patient Unable To Answer (10/06/2023)  Recent Concern: Housing - High Risk (09/11/2023)   Received [facility]  Transportation Needs: Patient Unable To Answer (10/06/2023)  Utilities: Patient Unable To Answer (10/06/2023)  Depression (PHQ2-9): Low Risk  (05/07/2023)  Financial Resource Strain: Medium Risk (09/11/2023)   Received from [facility]  Social Connections: Unknown (10/06/2023)  Tobacco Use: Medium Risk (11/05/2023)    Readmission Risk Interventions     No data to display

## 2023-11-05 NOTE — Anesthesia Procedure Notes (Signed)
 Procedure Name: Intubation Date/Time: 11/05/2023 12:10 PM  Performed by: Niki Barter, CRNAPre-anesthesia Checklist: Patient identified, Emergency Drugs available, Suction available and Patient being monitored Patient Re-evaluated:Patient Re-evaluated prior to induction Oxygen Delivery Method: Circle system utilized Preoxygenation: Pre-oxygenation with 100% oxygen Induction Type: IV induction, Rapid sequence and Cricoid Pressure applied Laryngoscope Size: McGrath and 3 Tube type: Oral Tube size: 7.0 mm Number of attempts: 2 Airway Equipment and Method: Stylet Placement Confirmation: ETT inserted through vocal cords under direct vision, positive ETCO2 and breath sounds checked- equal and bilateral Secured at: 22 cm Tube secured with: Tape Dental Injury: Teeth and Oropharynx as per pre-operative assessment

## 2023-11-05 NOTE — Anesthesia Postprocedure Evaluation (Signed)
 Anesthesia Post Note  Patient: Robert Vega  Procedure(s) Performed: EGD (ESOPHAGOGASTRODUODENOSCOPY)  Patient location during evaluation: Endoscopy Anesthesia Type: General Level of consciousness: awake and alert Pain management: pain level controlled Vital Signs Assessment: post-procedure vital signs reviewed and stable Respiratory status: spontaneous breathing, nonlabored ventilation, respiratory function stable and patient connected to nasal cannula oxygen Cardiovascular status: blood pressure returned to baseline and stable Postop Assessment: no apparent nausea or vomiting Anesthetic complications: no   No notable events documented.   Last Vitals:  Vitals:   11/05/23 1300 11/05/23 1310  BP: (!) 141/64 132/62  Pulse: 97 92  Resp: 16 (!) 27  Temp:    SpO2: 94% 95%    Last Pain:  Vitals:   11/05/23 1310  TempSrc:   PainSc: 0-No pain                 Portia Brittle Verlena Marlette

## 2023-11-05 NOTE — Progress Notes (Signed)
 Nutrition Follow-up  DOCUMENTATION CODES:   Non-severe (moderate) malnutrition in context of acute illness/injury  INTERVENTION:   -Resume TF via PEG:    Osmolite 1.5 @ 60 ml/hr   60 ml Prosource TF20 BID (or formulary substitution)    200 ml free water  flush every 4 hours per MD   Tube feeding regimen provides 2320 kcal (100% of needs), 130 grams of protein, and 1097 ml of H2O. Total free water : 2297 ml daily  NUTRITION DIAGNOSIS:   Moderate Malnutrition related to acute illness as evidenced by mild fat depletion, mild muscle depletion, moderate muscle depletion, percent weight loss.  Ongoing  GOAL:   Patient will meet greater than or equal to 90% of their needs  Met with TF  MONITOR:   Diet advancement, Labs, Weight trends, TF tolerance, I & O's, Skin  REASON FOR ASSESSMENT:   Consult Assessment of nutrition requirement/status  ASSESSMENT:   83 y/o male with h/o GERD, esophageal dysphagia, HTN, BPH, mood disorder and hiatal hernia who is admitted with aspiration event, PNA, sepsis, cardiac arrest and dysphagia.  5/13- s/p EGD- found to have food in the entire esophagus and gastric polyps. PEG tube placed  5/14- extubated 5/15- TF re-started 5/20- s/p EGD- esophageal dilation with botox  injection 5/22- advanced to clear liquid diet 5/24- pt pulled out PEG 5/25- PEG replaced by GI 5/26- KUB revealed some stool in the rectum and paucity of air in the colon, CT revealed G-tube not in the stomach, has spillage of contrast into the peritoneal cavity and moderate amount of pneumoperitoneum, TF d/c  5/27- s/p Exploratory laparotomy, takedown of gastrocutaneous fistula and repair of gastrotomy, placement of gastrostomy tube  5/28- TF resumed 6/10- s/p EGD- revealed dilation of entire esophagus, mildly severe esophagitis with no bleeding, staples removed per surgey  Reviewed I/O's: +1.6 L x 24 hours and +7.6 L since 10/22/23  UOP: 1.5 L x 24 hours  Case discussed  with RN, TOC, and MD. Pt was supposed to discharge to SNF today, however, pt developed GI bleed and will not discharge to SNF today.   Per GI notes, no plans to PO intake due to high aspiration intake and end-stage esophageal disease. Pt NPO and receives TF via PEG for sole source nutrition. Pt previously tolerating TF well; plan to resume today per MD (TF held yesterday due to concern for GI bleed).    Per TOC, SNF does not have Prosource TF available, but does have Prosource No Carb. Informed TOC that this is an acceptable substitution. SNF regimen of Osmolite 1.5 @ 60 ml/hr with 30 ml Prosource NoCarb BID will provide: 2360 kcal (100% of needs), 120 grams of protein, and 1097 ml of H2O. Total free water : 2297 ml daily.   Wt has been stable over the past week.   Medications reviewed and include protonix .   Labs reviewed: CBGS: 112-132 (inpatient orders for glycemic control are 0-9 units insulin  aspart every 4 hours).    Diet Order:   Diet Order             Diet NPO time specified  Diet effective now                   EDUCATION NEEDS:   No education needs have been identified at this time  Skin:  Skin Assessment: Skin Integrity Issues: Skin Integrity Issues:: Incisions Incisions: abdomen Other: skin tear to lt lower arm  Last BM:  11/03/23 (type 6)  Height:   Ht Readings  from Last 1 Encounters:  10/08/23 5' 9.02" (1.753 m)    Weight:   Wt Readings from Last 1 Encounters:  11/05/23 101.8 kg    Ideal Body Weight:  72.7 kg  BMI:  Body mass index is 33.13 kg/m.  Estimated Nutritional Needs:   Kcal:  2150-2350  Protein:  105-130 grams  Fluid:  2-2.2 L    Herschel Lords, RD, LDN, CDCES Registered Dietitian III Certified Diabetes Care and Education Specialist If unable to reach this RD, please use "RD Inpatient" group chat on secure chat between hours of 8am-4 pm daily

## 2023-11-05 NOTE — Transfer of Care (Signed)
 Immediate Anesthesia Transfer of Care Note  Patient: Robert Vega  Procedure(s) Performed: EGD (ESOPHAGOGASTRODUODENOSCOPY)  Patient Location: Endoscopy Unit  Anesthesia Type:General  Level of Consciousness: drowsy and patient cooperative  Airway & Oxygen Therapy: Patient Spontanous Breathing and Patient connected to face mask oxygen  Post-op Assessment: Report given to RN and Post -op Vital signs reviewed and stable  Post vital signs: Reviewed and stable  Last Vitals:  Vitals Value Taken Time  BP 154/69 11/05/23 1240  Temp 36.4 C 11/05/23 1230  Pulse 99 11/05/23 1243  Resp 21 11/05/23 1243  SpO2 98 % 11/05/23 1243  Vitals shown include unfiled device data.  Last Pain:  Vitals:   11/05/23 1240  TempSrc:   PainSc: 0-No pain         Complications: No notable events documented.

## 2023-11-05 NOTE — Op Note (Signed)
 [facility] Gastroenterology Patient Name: Robert Vega Procedure Date: 11/05/2023 11:56 AM MRN: [id number] Account #: [id number] Date of Birth: [birth year] Admit Type: Inpatient Age: 83 Room: [facility] ENDO ROOM 4 Gender: Male Note Status: Finalized Instrument Name: Upper Endoscope [id number] Procedure:             Upper GI endoscopy Indications:           Melena Providers:             Marnee Sink MD, MD Medicines:             General Anesthesia Complications:         No immediate complications. Procedure:             Pre-Anesthesia Assessment:                        - Prior to the procedure, a History and Physical was                         performed, and patient medications and allergies were                         reviewed. The patient's tolerance of previous                         anesthesia was also reviewed. The risks and benefits                         of the procedure and the sedation options and risks                         were discussed with the patient. All questions were                         answered, and informed consent was obtained. Prior                         Anticoagulants: The patient has taken no anticoagulant                         or antiplatelet agents. ASA Grade Assessment: III - A                         patient with severe systemic disease. After reviewing                         the risks and benefits, the patient was deemed in                         satisfactory condition to undergo the procedure.                        After obtaining informed consent, the endoscope was                         passed under direct vision. Throughout the procedure,                         the patient's blood pressure, pulse,  and oxygen                         saturations were monitored continuously. The Endoscope                         was introduced through the mouth, and advanced to the                         second part of duodenum. The upper  GI endoscopy was                         accomplished without difficulty. The patient tolerated                         the procedure well. Findings:      The lumen of the esophagus was severely dilated.      Mildly severe esophagitis with no bleeding was found in the entire       esophagus.      There was evidence of a gastrostomy present in the gastric body.      The examined duodenum was normal. Impression:            - Dilation in the entire esophagus.                        - Mildly severe esophagitis with no bleeding.                        - Gastrostomy present.                        - Normal examined duodenum.                        - No specimens collected. Recommendation:        - Return patient to hospital ward for ongoing care.                        - Resume previous diet.                        - Continue present medications. Procedure Code(s):     --- Professional ---                        [phone], Esophagogastroduodenoscopy, flexible,                         transoral; diagnostic, including collection of                         specimen(s) by brushing or washing, when performed                         (separate procedure) Diagnosis Code(s):     --- Professional ---                        K92.1, Melena (includes Hematochezia)                        K20.90, Esophagitis,  unspecified without bleeding CPT copyright 2022 American Medical Association. All rights reserved. The codes documented in this report are preliminary and upon coder review may  be revised to meet current compliance requirements. Marnee Sink MD, MD 11/05/2023 12:21:26 PM This report has been signed electronically. Number of Addenda: 0 Note Initiated On: 11/05/2023 11:56 AM Estimated Blood Loss:  Estimated blood loss: none.      [facility]

## 2023-11-05 NOTE — Progress Notes (Signed)
 PROGRESS NOTE    Robert Vega  ZOX:[id number] DOB: 1940/08/25 DOA: 10/05/2023 PCP: Helaine Llanos, MD    Brief Narrative:  83 y.o male with significant PMH of OSA, GERD, Obesity, HTN, Dysphagia: EGD 03/2022 with food in upper esophagus complicated by aspiration event, cardiac arrest with round of CPR, and post resuscitation EGD with concern for lack of peristalsis. Prior EGD 08/2020 with note of abnormal cricopharyngeus, decrease in motility in esophagus, and spastic LES who presented to the ED from with hypoxia, fever and generalized weakness.  Patient developed acute respiratory failure requiring intubation due to aspiration pneumonia, he was treated with broad-spectrum antibiotics and managed in ICU. Condition had improved, extubated 5/15, but has significant agitation required brief course of Precedex . 5/10: Admit to [facility] service with sepsis due to Aspiration Pneumonia.  Course complicated by Acute Respiratory Failure due to Aspiration of vomitus requiring s/p cardiac arrest  transfer to ICU and intubation.  5/11 flexible bronchoscopy done by critical care team 5/12 remains intubated 5/13 remains on vent, s/p PEG tube, +vomiting last night, failed SAT/SBT 5/14 extubated successfully but with severe delirium 5/20.  Botulinum toxin injection into the lower esophageal sphincter by Dr. Ole Berkeley   5/21 esophagram showing diffusely distended esophagus with distal obstruction and severe dysmotility suggestive of achalasia. 5/22.  Advised I would not give him any food and will continue PEG feeding he can try liquids to see if that goes down but still high risk for aspiration.  Patient had a fall on the evening of 5/22. 5/23.  Patient feels okay.  Awaken from sleep.  Did not offer any complaints.  Awaiting insurance authorization for rehab. 5/24.  Patient feeling okay.  States he is doing okay with the liquid diet.  Still feels weak. 5/25.  Patient pulled out PEG tube this morning.  Foley catheter  placed in the stoma.  GI able to place a another PEG tube and remove the Foley. 5/26.  Creatinine up at 1.83.  Continue IV fluid hydration.  Patient started having pain after tube feeding started.  CT scan showed that the PEG tube was not in the correct place.  Notified gastroenterology.  Started empiric antibiotics. 5/27.  Patient was taken urgently to the operating room by Dr. Cornel Diesel because the patient had fever and elevated white count with suspected intra-abdominal sepsis. 5/29: Agitation/sundowning noted around shift change 5/28.  Suspect hospital-acquired delirium.  Received doses of atypical antipsychotics.  Much more calm, awake, alert in AM. 6/1: Foley discontinued yesterday.  Has some AKI.  Has been urine.  Mental status improved. 6/2: Kidney function improving, hypernatremia improving 6/3: Lethargy/confusion noted by PT today  Assessment & Plan:   Principal Problem:   Severe sepsis (HCC) Active Problems:   Gastrostomy complication (HCC)   Dysphagia   Achalasia of esophagus   AKI (acute kidney injury) (HCC)   Acute delirium   Acute respiratory failure with hypoxia and hypercapnia (HCC)   Essential hypertension, benign   GERD (gastroesophageal reflux disease)   Multifocal pneumonia   History of dysphagia   Aspiration pneumonia (HCC)   Obesity (BMI 30-39.9)   Generalized weakness   Generalized abdominal pain   Malnutrition of moderate degree   Melena   Melena Anemia New 6/9. Hgb has drifted to 7.5 from 9s several days ago. Transfused 1 unit per anesthesiology request to have hgb above 8 prior to EGD. EGD today with severe esophagitis but no obvious source of bleeding. Per secure chat with Dr. Ole Berkeley, if continues to  show signs of bleeding, hospice would be appropriate, but if family desires additional w/u could consider tagged RBC scan versus repeat EGD to place capsule for capsule endoscopy.  - continue ppi IV to bid - trend hgb  Severe sepsis (HCC) Recurrence with  different problem.  Intra-abdominal infection secondary to PEG tube not being in the right place.  Elevated white count and fever and acute kidney injury.  Patient brought emergently to the operating room on 5/27 by Dr. Cornel Diesel.   Plan: Continue tube feeds N.p.o. for the foreseeable future Continue IV Zosyn  for now, can de-escalate to oral regimen if patient leaves the hospital prior to completion of a total 2-week course of antibiotic Gen surg to see today for staple removal Repeat CT scan 6/7 (leukocytosis) nothing acute  Gastrostomy complication (HCC) Patient self discontinued PEG tube and malpositioning likely resulted in some intra-abdominal sepsis.  Tube feeds were held.  Restarted 5/28.     AKI (acute kidney injury) (HCC) with possible progression to ckd 3b Persists, renal u/s shows stable mild hydro, bladder scan neg, doesn't appear he has had contrast here, uop adequate, not on nephrotoxic agents. May have progressed to ckd 3b - fluids re-started 6/7 will continue as tube feeds have been held today  Hypernatremia Resolved, will need continued adjustments of FWFs   Achalasia of esophagus Status post botulinum toxin injection on 5/20.   Esophagram showing dilated esophagus distal obstruction. Per GI esophagram continues to demonstrate severe dysmotility with dilated esophagus.  Consistent with end-stage esophageal disease.  Patient is n.p.o. indefinitely.  Should only be fed via PEG tube considering the risk of aspiration. Will need outpatient referral to tertiary advanced GI for consideration of POEMS procedure.  Recommend this only after a few weeks recovery period.    Dysphagia Likely secondary to achalasia.  Patient had Botox  injection of the lower esophageal sphincter due to lower esophageal sphincter hypertension causing the patient not being able to swallow and empty the esophagus of his ingested food.  Food was in the esophagus on the previous EGD and on CT 6/7.  NPO for  forseeable future  Delirium Agitation/sundowning Plan: Continue as needed Haldol  for severe agitation.  Granddaughter has asked to discontinue prn antipsychotics   Acute respiratory failure with hypoxia and hypercapnia (HCC) Pneumonia Now resolved   Essential hypertension, benign Bp wnl off meds   GERD (gastroesophageal reflux disease) Continue PPI   Generalized weakness Therapy evaluations as tolerated Will benefit from placement to skilled nursing facility, family is deciding on a facility   Obesity (BMI 30-39.9) BMI 33.16 Complicating factor in overall care and prognosis    DVT prophylaxis: lovenox  Code Status: Full Family Communication:  granddaughter brittany updated telephonically 6/9 Disposition Plan: Status is: Inpatient Remains inpatient appropriate because: Multiple acute issues as above   Level of care: Med-Surg  Consultants:  General surgery (signed off) GI  Procedures:  Ex lap  Antimicrobials: Zosyn    Subjective: Seen and examined. Remains confused. Black stool today per RN  Objective: Vitals:   11/05/23 1255 11/05/23 1300 11/05/23 1310 11/05/23 1346  BP:  (!) 141/64 132/62 (!) 110/48  Pulse: 97 97 92 90  Resp: (!) 21 16 (!) 27 20  Temp:    98.2 F (36.8 C)  TempSrc:    Oral  SpO2: 96% 94% 95% 91%  Weight:      Height:        Intake/Output Summary (Last 24 hours) at 11/05/2023 1527 Last data filed at 11/05/2023 1346 Gross  per 24 hour  Intake 3574 ml  Output 1650 ml  Net 1924 ml   Filed Weights   11/03/23 0500 11/04/23 0400 11/05/23 0500  Weight: 98.8 kg 100.4 kg 101.8 kg    Examination:  General exam: Appears chronically ill Respiratory system: Coarse breath sounds, rales at bases Cardiovascular system: S1-S2, RRR  Gastrointestinal system: Soft, NT ND, + PEG, abdominal binder in place. Central nervous system: Alert moving all 4 Extremities: warm, no edema  Skin: No visible rashes, lesions or ulcers Psychiatry: Judgement  and insight appear impaired. Mood & affect confused.     Data Reviewed: I have personally reviewed following labs and imaging studies  CBC: Recent Labs  Lab 10/30/23 0518 10/31/23 0218 11/02/23 0357 11/03/23 0431 11/04/23 1251 11/05/23 0407  WBC 13.5* 16.7* 17.2* 15.4* 13.2* 12.6*  NEUTROABS 10.1* 11.9*  --   --   --   --   HGB 9.9* 9.4* 8.7* 8.0* 7.8* 7.5*  HCT 30.3* 30.1* 26.9* 24.6* 23.9* 23.0*  MCV 97.7 100.0 98.2 96.1 97.2 96.2  PLT 256 292 332 304 309 307   Basic Metabolic Panel: Recent Labs  Lab 10/31/23 0218 11/01/23 0954 11/02/23 0357 11/03/23 0431 11/04/23 0445  NA 149* 150* 146* 138 139  K 4.2 4.1 4.1 4.1 4.0  CL 114* 115* 110 104 106  CO2 26 26 28 25 24   GLUCOSE 132* 153* 129* 121* 140*  BUN 45* 51* 56* 59* 55*  CREATININE 1.66* 1.77* 2.04* 2.05* 1.96*  CALCIUM 7.7* 7.8* 7.7* 7.6* 7.5*   GFR: Estimated Creatinine Clearance: 33 mL/min (A) (by C-G formula based on SCr of 1.96 mg/dL (H)). Liver Function Tests: Recent Labs  Lab 11/04/23 0445  AST 44*  ALT 72*  ALKPHOS 80  BILITOT 0.4  PROT 6.5  ALBUMIN 2.0*    No results for input(s): "LIPASE", "AMYLASE" in the last 168 hours. No results for input(s): "AMMONIA" in the last 168 hours. Coagulation Profile: No results for input(s): "INR", "PROTIME" in the last 168 hours.  Cardiac Enzymes: No results for input(s): "CKTOTAL", "CKMB", "CKMBINDEX", "TROPONINI" in the last 168 hours. BNP (last 3 results) No results for input(s): "PROBNP" in the last 8760 hours. HbA1C: No results for input(s): "HGBA1C" in the last 72 hours. CBG: Recent Labs  Lab 11/04/23 1511 11/04/23 2102 11/05/23 0015 11/05/23 0442 11/05/23 0731  GLUCAP 132* 131* 120* 112* 119*   Lipid Profile: No results for input(s): "CHOL", "HDL", "LDLCALC", "TRIG", "CHOLHDL", "LDLDIRECT" in the last 72 hours. Thyroid  Function Tests: No results for input(s): "TSH", "T4TOTAL", "FREET4", "T3FREE", "THYROIDAB" in the last 72 hours. Anemia  Panel: No results for input(s): "VITAMINB12", "FOLATE", "FERRITIN", "TIBC", "IRON", "RETICCTPCT" in the last 72 hours. Sepsis Labs: Recent Labs  Lab 11/03/23 0431  PROCALCITON 0.19     No results found for this or any previous visit (from the past 240 hours).        Radiology Studies: No results found.         Scheduled Meds:  feeding supplement (PROSource TF20)  60 mL Per Tube BID   gentamicin  ointment   Topical TID   insulin  aspart  0-9 Units Subcutaneous Q4H   mouth rinse  15 mL Mouth Rinse 4 times per day   pantoprazole  (PROTONIX ) IV  40 mg Intravenous Q12H   Continuous Infusions:  dextrose  5 % and 0.45 % NaCl 100 mL/hr at 11/04/23 1641     LOS: 31 days      Raymonde Calico, MD [facility]  If 7PM-7AM, please contact night-coverage  11/05/2023, 3:27 PM

## 2023-11-05 NOTE — Progress Notes (Signed)
 Brief Progress Note Robert Vega is s/p laparotomy and placement of gastrostomy on 05/27 No acute issues from surgical perspective Staples removed at bedside without issue Okay to continue abdominal binder to limit risk of removing gastrostomy tube given confusion We will be available if situation or needs change  -- Apolonio Bay, PA-C [facility] 11/05/2023, 3:38 PM M-F: 7am - 4pm

## 2023-11-05 NOTE — Progress Notes (Signed)
 PT Cancellation Note  Patient Details Name: Robert Vega MRN: [id number] DOB: Feb 20, 1941   Cancelled Treatment:    Reason Eval/Treat Not Completed: Patient at procedure or test/unavailable. Patient OTF for Endoscopy. Will re-attempt at later date/time as medically appropriate.    Amaad Byers M Fairly, PT, DPT 11/05/23 12:24 PM

## 2023-11-05 NOTE — Progress Notes (Signed)
 I was notified by the hospital team yesterday the patient was having black stools and a drop in hemoglobin.  I spoke to the patient's granddaughter who is the POA yesterday and we agreed to proceed with a upper endoscopy today to look for the source of GI bleeding and anemia.  The patient has been n.p.o. and will have the EGD today.

## 2023-11-06 DIAGNOSIS — R6520 Severe sepsis without septic shock: Secondary | ICD-10-CM | POA: Diagnosis not present

## 2023-11-06 DIAGNOSIS — A419 Sepsis, unspecified organism: Secondary | ICD-10-CM | POA: Diagnosis not present

## 2023-11-06 LAB — GLUCOSE, CAPILLARY
Glucose-Capillary: 114 mg/dL — ABNORMAL HIGH (ref 70–99)
Glucose-Capillary: 116 mg/dL — ABNORMAL HIGH (ref 70–99)
Glucose-Capillary: 117 mg/dL — ABNORMAL HIGH (ref 70–99)
Glucose-Capillary: 124 mg/dL — ABNORMAL HIGH (ref 70–99)
Glucose-Capillary: 124 mg/dL — ABNORMAL HIGH (ref 70–99)
Glucose-Capillary: 130 mg/dL — ABNORMAL HIGH (ref 70–99)
Glucose-Capillary: 131 mg/dL — ABNORMAL HIGH (ref 70–99)

## 2023-11-06 LAB — BPAM RBC
Blood Product Expiration Date: 202507082359
ISSUE DATE / TIME: 202506101025
Unit Type and Rh: 5100

## 2023-11-06 LAB — BASIC METABOLIC PANEL WITH GFR
Anion gap: 9 (ref 5–15)
BUN: 53 mg/dL — ABNORMAL HIGH (ref 8–23)
CO2: 24 mmol/L (ref 22–32)
Calcium: 7.9 mg/dL — ABNORMAL LOW (ref 8.9–10.3)
Chloride: 110 mmol/L (ref 98–111)
Creatinine, Ser: 2.33 mg/dL — ABNORMAL HIGH (ref 0.61–1.24)
GFR, Estimated: 27 mL/min — ABNORMAL LOW (ref >60)
Glucose, Bld: 106 mg/dL — ABNORMAL HIGH (ref 70–99)
Potassium: 4.3 mmol/L (ref 3.5–5.1)
Sodium: 143 mmol/L (ref 135–145)

## 2023-11-06 LAB — TYPE AND SCREEN
ABO/RH(D): O POS
Antibody Screen: NEGATIVE
Unit division: 0

## 2023-11-06 LAB — CBC
HCT: 26.7 % — ABNORMAL LOW (ref 39.0–52.0)
Hemoglobin: 8.8 g/dL — ABNORMAL LOW (ref 13.0–17.0)
MCH: 30.3 pg (ref 26.0–34.0)
MCHC: 33 g/dL (ref 30.0–36.0)
MCV: 92.1 fL (ref 80.0–100.0)
Platelets: 319 10*3/uL (ref 150–400)
RBC: 2.9 MIL/uL — ABNORMAL LOW (ref 4.22–5.81)
RDW: 15.3 % (ref 11.5–15.5)
WBC: 10.7 10*3/uL — ABNORMAL HIGH (ref 4.0–10.5)
nRBC: 0 % (ref 0.0–0.2)

## 2023-11-06 MED ORDER — FREE WATER
300.0000 mL | Status: DC
  Administered 2023-11-06 – 2023-11-07 (×6): 300 mL

## 2023-11-06 NOTE — Progress Notes (Signed)
 Occupational Therapy Treatment Patient Details Name: Robert Vega MRN: [id number] DOB: 04/05/1941 Today's Date: 11/06/2023   History of present illness 83 y.o male with significant PMH of OSA, GERD, Obesity, HTN, Dysphagia: EGD 03/2022 with food in upper esophagus complicated by aspiration event, cardiac arrest with round of CPR, and post resuscitation EGD with concern for lack of peristalsis. Pt presented to ED on 10/05/2023 with hypoxia, fever and generalized weakness; developed acute respiratory failure requiring intubation 5/10 due to aspiration pneumonia, extubated 5/15, but had significant agitation required brief course of Precedex ; pt now s/p exploratory laparotomy with closure of gastrotomy and insertion of gastrostomy tube on 10/22/23   OT comments  Pt seen for OT treatment on this date. Upon arrival to room pt semi supine in bed with sitter at bedside, agreeable to tx. Pt requires total assist +2 to sit on the EOB, MINA to remain sitting up right, with frequent verbal/tactile cues for hand placement and sequencing/redirection. Pt unable to tolerate sitting up, return to supine for bed level pericare and linen change. NT and RN in room to assist. Throughout session pt it was noted that pt emotions were labile, difficult to console and redirecte. Pt left with nursing staff at bedside. Pt making progress toward goals, will continue to follow POC. Discharge recommendation remains appropriate.        If plan is discharge home, recommend the following:  Assistance with cooking/housework;Help with stairs or ramp for entrance;Supervision due to cognitive status;A lot of help with bathing/dressing/bathroom;Two people to help with walking and/or transfers   Equipment Recommendations  Other (comment)    Recommendations for Other Services      Precautions / Restrictions Precautions Precautions: Fall Recall of Precautions/Restrictions: Impaired Precaution/Restrictions Comments: abdominal  binder Restrictions Weight Bearing Restrictions Per Provider Order: No       Mobility Bed Mobility Overal bed mobility: Needs Assistance Bed Mobility: Rolling, Supine to Sit, Sit to Supine Rolling: +2 for physical assistance, Total assist   Supine to sit: +2 for physical assistance, Total assist, HOB elevated, Used rails Sit to supine: +2 for physical assistance, Total assist, HOB elevated   General bed mobility comments: Attempted sitting on the EOB, unable to follow directions to safely support self with UEs.    Transfers                   General transfer comment: Will continue to attempt     Balance Overall balance assessment: Needs assistance Sitting-balance support: Feet unsupported, Single extremity supported Sitting balance-Leahy Scale: Poor Sitting balance - Comments: Attempted sitting on the EOB, CGA-MINA to remain sitting up                                   ADL either performed or assessed with clinical judgement   ADL Overall ADL's : Needs assistance/impaired Eating/Feeding: NPO                           Toileting- Clothing Manipulation and Hygiene: Maximal assistance;Total assistance;Bed level Toileting - Clothing Manipulation Details (indicate cue type and reason): Pericare completed at bed level with RN and NT in room to assist, frequent redirection required to keep pt calm.       General ADL Comments: Limited ADL participation on this date due to soiled bed linens and pericare needs.    Communication Communication Communication: Impaired Factors Affecting Communication: Reduced  clarity of speech   Cognition Arousal: Alert Behavior During Therapy: Lability Cognition: Cognition impaired   Orientation impairments: Situation, Time, Place Awareness: Intellectual awareness impaired, Online awareness impaired Memory impairment (select all impairments): Short-term memory Attention impairment (select first level of  impairment): Focused attention Executive functioning impairment (select all impairments): Sequencing, Problem solving, Reasoning OT - Cognition Comments: On this date pt tearful, very garbled speach                 Following commands: Impaired Following commands impaired: Follows one step commands inconsistently      Cueing   Cueing Techniques: Verbal cues, Gestural cues, Tactile cues  Exercises Exercises: Other exercises Other Exercises Other Exercises: Edu: Role of OT, redirection as needed    Shoulder Instructions       General Comments Pt arm bleeding from pt cutting self on his finger nail, RN in room to assess.    Pertinent Vitals/ Pain       Pain Assessment Pain Assessment: Faces Faces Pain Scale: Hurts a little bit Pain Location: generalized Pain Descriptors / Indicators: Discomfort Pain Intervention(s): Limited activity within patient's tolerance, Monitored during session, Repositioned  Home Living                                          Prior Functioning/Environment              Frequency  Min 2X/week        Progress Toward Goals  OT Goals(current goals can now be found in the care plan section)  Progress towards OT goals: Progressing toward goals  Acute Rehab OT Goals OT Goal Formulation: Patient unable to participate in goal setting Time For Goal Achievement: 11/18/23 Potential to Achieve Goals: Fair ADL Goals Pt Will Perform Grooming: with supervision;sitting Pt Will Perform Lower Body Dressing: with mod assist;with min assist;sit to/from stand Pt Will Transfer to Toilet: with mod assist;bedside commode;stand pivot transfer Pt Will Perform Toileting - Clothing Manipulation and hygiene: with min assist;sit to/from stand  Plan      Co-evaluation    PT/OT/SLP Co-Evaluation/Treatment: Yes Reason for Co-Treatment: Necessary to address cognition/behavior during functional activity;For patient/therapist safety;To address  functional/ADL transfers PT goals addressed during session: Mobility/safety with mobility;Balance OT goals addressed during session: ADL's and self-care      AM-PAC OT 6 Clicks Daily Activity     Outcome Measure   Help from another person eating meals?: Total Help from another person taking care of personal grooming?: A Lot Help from another person toileting, which includes using toliet, bedpan, or urinal?: A Lot Help from another person bathing (including washing, rinsing, drying)?: A Lot Help from another person to put on and taking off regular upper body clothing?: A Lot Help from another person to put on and taking off regular lower body clothing?: A Lot 6 Click Score: 11    End of Session    OT Visit Diagnosis: Unsteadiness on feet (R26.81);Repeated falls (R29.6);Muscle weakness (generalized) (M62.81)   Activity Tolerance Patient tolerated treatment well   Patient Left in bed;with call bell/phone within reach;with bed alarm set;with nursing/sitter in room   Nurse Communication Other (comment) (RN in room throughout session)        Time: 4098-1191 OT Time Calculation (min): 17 min  Charges: OT General Charges $OT Visit: 1 Visit OT Treatments $Self Care/Home Management : 8-22 mins  Rosaria Common M.S.  OTR/L  11/06/23, 1:09 PM

## 2023-11-06 NOTE — Progress Notes (Addendum)
 PROGRESS NOTE    Robert Vega   WNU:[id number] DOB: 1941/01/16  DOA: 10/05/2023 Date of Service: 11/06/23 which is hospital day 32  PCP: Helaine Llanos, MD    Hospital course / significant events:   HPI: 83 y.o male with significant PMH of OSA, GERD, Obesity, HTN, Dysphagia - presented to the ED 10/05/2023 from with hypoxia, fever and generalized weakness. Dx sepsis/pneumonia, concern for aspiration   Of note, EGD 03/2022 with food in upper esophagus complicated by aspiration event, cardiac arrest with round of CPR, and post resuscitation EGD with concern for lack of peristalsis. Hx rior EGD 08/2020 with note of abnormal cricopharyngeus, decrease in motility in esophagus, and spastic LES who   5/10: Admit to [facility] service with sepsis due to Aspiration Pneumonia.  Course complicated by Acute Respiratory Failure due to Aspiration of vomitus requiring s/p cardiac arrest  transfer to ICU and intubation.  5/11 flexible bronchoscopy done by critical care team 5/12 remains intubated 5/13 remains on vent, s/p PEG tube, +vomiting last night, failed SAT/SBT 5/14 extubated successfully but with severe delirium 5/20.  Botulinum toxin injection into the lower esophageal sphincter by Dr. Ole Berkeley 5/21 esophagram showing diffusely distended esophagus with distal obstruction and severe dysmotility suggestive of achalasia. 5/22.  Advised I would not give him any food and will continue PEG feeding he can try liquids to see if that goes down but still high risk for aspiration.   5/22: fall  5/23.   Awaiting insurance authorization for rehab. 5/24.  Tolerating some clear luquids\ /25.  Patient pulled out PEG tube this morning.  Foley catheter placed in the stoma.  GI able to place a another PEG tube and remove the Foley. 5/26.  Creatinine up at 1.83.  Continue IV fluid hydration.  Patient started having pain after tube feeding started.  CT scan showed that the PEG tube was not in the correct place.   Notified GI.  Started empiric antibiotics. 5/27.  Patient was taken urgently to the operating room by Dr. Cornel Diesel because the patient had fever and elevated white count with suspected intra-abdominal sepsis. 5/28: restart tube feeds  05/30: advancing tube feeds, remains on Zosyn   06/01: on and off AKI 06/04: family working on decision for SNF  06/07: repeat CT abd d/t higher WBC, holding tube feeds but CT nothing acute  06/09: remains stable, pending SNF auth. New melena  06/10: GI repeat EGD, no bleeding  06/11: GI nothing else to add and HH stable GI has s/o, Cr up a bit, increasing free water , granddaughter requests reeval from SLP will ask them to see him tm            ASSESSMENT & PLAN:   Melena Anemia Repeat EGD yesterday no concerns, GI has s/o HH has been stable Follow CBC in AM   Severe sepsis - resolved  Initially w/ aspiration pna Later in hospital stay w/ intra-abdominal infection secondary to PEG tube not being in the right place Continue abx IV Zosyn  for now, can de-escalate to oral regimen if patient leaves the hospital prior to completion of a total 2-week course of antibiotic  Achalasia Dysphagia Aspiration risk is high  Status post botulinum toxin injection on 5/20.   Continue tube feeds N.p.o. for the foreseeable future per GI Granddaughter requests SLP reeval will ask them to see him tm  Advised granddaughter that if she decides for po, she must accept risk for aspiration  Will need outpatient referral to tertiary advanced GI  for consideration of POEMS procedure.  Recommend this only after a few weeks recovery period.    AKI (acute kidney injury) (HCC) with possible progression to ckd 3b uop adequate, not on nephrotoxic agents. May have progressed to ckd 3b Increased free water  admin Monitor BMP   Delirium Agitation/sundowning Granddaughter has asked to discontinue prn antipsychotics  GERD (gastroesophageal reflux disease) Continue PPI   Acute  respiratory failure with hypoxia and hypercapnia (HCC) Pneumonia Now resolved   Essential hypertension, benign Bp wnl off meds    Generalized weakness Therapy as tolerated Will benefit from placement to skilled nursing facility, family is deciding on a facility     Class 1 obesity based on BMI: Body mass index is 32.44 kg/m.Robert Vega Significantly low or high BMI is associated with higher medical risk.  Underweight - under 18  overweight - 25 to 29 obese - 30 or more Class 1 obesity: BMI of 30.0 to 34 Class 2 obesity: BMI of 35.0 to 39 Class 3 obesity: BMI of 40.0 to 49 Super Morbid Obesity: BMI 50-59 Super-super Morbid Obesity: BMI 60+ Healthy nutrition and physical activity advised as adjunct to other disease management and risk reduction treatments    DVT prophylaxis: none w/ anemia IV fluids: no continuous IV fluids  Nutrition: tube feeds, granddaughter requests SLP eval to reconsider for CLD see above Central lines / other devices: PEG  Code Status: FULL CODE ACP documentation reviewed:  none on file in [facility]  TOC needs: placement Medical barriers to dispo: AKI. Expected medical readiness for discharge 1-2 days.               Subjective / Brief ROS:  Patient reports no concerns Denies CP/SOB.  Pain controlled.    Family Communication: d/w granddaughter on the phone 11/06/23 5:32 PM     Objective Findings:  Vitals:   11/06/23 0350 11/06/23 0441 11/06/23 0825 11/06/23 1451  BP: (!) 103/59  (!) 103/91 104/76  Pulse: 80  87 90  Resp: 20  16 16   Temp: 98.4 F (36.9 C)   99 F (37.2 C)  TempSrc:      SpO2: 94%  94% 96%  Weight:  99.7 kg    Height:        Intake/Output Summary (Last 24 hours) at 11/06/2023 1732 Last data filed at 11/06/2023 1231 Gross per 24 hour  Intake --  Output 2450 ml  Net -2450 ml   Filed Weights   11/04/23 0400 11/05/23 0500 11/06/23 0441  Weight: 100.4 kg 101.8 kg 99.7 kg    Examination:  Physical  Exam Constitutional:      General: He is not in acute distress.    Appearance: He is not ill-appearing.  Cardiovascular:     Rate and Rhythm: Normal rate and regular rhythm.  Pulmonary:     Effort: Pulmonary effort is normal.     Breath sounds: Normal breath sounds.  Abdominal:     Palpations: Abdomen is soft. There is no mass.     Tenderness: There is no abdominal tenderness.  Neurological:     Mental Status: He is alert. Mental status is at baseline. He is disoriented.  Psychiatric:        Mood and Affect: Mood normal.          Scheduled Medications:   feeding supplement (PROSource TF20)  60 mL Per Tube BID   free water   300 mL Per Tube Q4H   gentamicin  ointment   Topical TID   insulin  aspart  0-9 Units Subcutaneous Q4H   mouth rinse  15 mL Mouth Rinse 4 times per day   pantoprazole  (PROTONIX ) IV  40 mg Intravenous Q12H    Continuous Infusions:  feeding supplement (OSMOLITE 1.5 CAL) 1,000 mL (11/06/23 1241)    PRN Medications:  acetaminophen , acetaminophen , bisacodyl , haloperidol , hydrALAZINE , ipratropium-albuterol , [DISCONTINUED] ondansetron  **OR** ondansetron  (ZOFRAN ) IV, mouth rinse, mouth rinse  Antimicrobials from admission:  Anti-infectives (From admission, onward)    Start     Dose/Rate Route Frequency Ordered Stop   10/21/23 2200  Ampicillin -Sulbactam (UNASYN ) 3 g in sodium chloride  0.9 % 100 mL IVPB  Status:  Discontinued        3 g 200 mL/hr over 30 Minutes Intravenous Every 6 hours 10/21/23 2011 10/21/23 2014   10/21/23 2200  piperacillin -tazobactam (ZOSYN ) IVPB 3.375 g        3.375 g 12.5 mL/hr over 240 Minutes Intravenous Every 8 hours 10/21/23 2019 10/26/23 2359   10/06/23 1600  cefTRIAXone  (ROCEPHIN ) 2 g in sodium chloride  0.9 % 100 mL IVPB        2 g 200 mL/hr over 30 Minutes Intravenous Every 24 hours 10/06/23 1415 10/09/23 1749   10/06/23 0600  piperacillin -tazobactam (ZOSYN ) IVPB 3.375 g  Status:  Discontinued        3.375 g 12.5 mL/hr over  240 Minutes Intravenous Every 8 hours 10/06/23 0356 10/06/23 1415   10/06/23 0500  cefTRIAXone  (ROCEPHIN ) 2 g in sodium chloride  0.9 % 100 mL IVPB  Status:  Discontinued        2 g 200 mL/hr over 30 Minutes Intravenous Every 24 hours 10/05/23 0750 10/06/23 0341   10/06/23 0500  azithromycin  (ZITHROMAX ) 500 mg in sodium chloride  0.9 % 250 mL IVPB  Status:  Discontinued        500 mg 250 mL/hr over 60 Minutes Intravenous Every 24 hours 10/05/23 0750 10/06/23 1409   10/05/23 0615  metroNIDAZOLE  (FLAGYL ) IVPB 500 mg        500 mg 100 mL/hr over 60 Minutes Intravenous  Once 10/05/23 0611 10/05/23 0910   10/05/23 0545  cefTRIAXone  (ROCEPHIN ) 2 g in sodium chloride  0.9 % 100 mL IVPB        2 g 200 mL/hr over 30 Minutes Intravenous Once 10/05/23 0533 10/05/23 0650   10/05/23 0545  azithromycin  (ZITHROMAX ) 500 mg in sodium chloride  0.9 % 250 mL IVPB        500 mg 250 mL/hr over 60 Minutes Intravenous  Once 10/05/23 0533 10/05/23 0805           Data Reviewed:  I have personally reviewed the following...  CBC: Recent Labs  Lab 10/31/23 0218 11/02/23 0357 11/03/23 0431 11/04/23 1251 11/05/23 0407 11/06/23 0500  WBC 16.7* 17.2* 15.4* 13.2* 12.6* 10.7*  NEUTROABS 11.9*  --   --   --   --   --   HGB 9.4* 8.7* 8.0* 7.8* 7.5* 8.8*  HCT 30.1* 26.9* 24.6* 23.9* 23.0* 26.7*  MCV 100.0 98.2 96.1 97.2 96.2 92.1  PLT 292 332 304 309 307 319   Basic Metabolic Panel: Recent Labs  Lab 11/01/23 0954 11/02/23 0357 11/03/23 0431 11/04/23 0445 11/06/23 0500  NA 150* 146* 138 139 143  K 4.1 4.1 4.1 4.0 4.3  CL 115* 110 104 106 110  CO2 26 28 25 24 24   GLUCOSE 153* 129* 121* 140* 106*  BUN 51* 56* 59* 55* 53*  CREATININE 1.77* 2.04* 2.05* 1.96* 2.33*  CALCIUM 7.8* 7.7* 7.6* 7.5* 7.9*  GFR: Estimated Creatinine Clearance: 27.5 mL/min (A) (by C-G formula based on SCr of 2.33 mg/dL (H)). Liver Function Tests: Recent Labs  Lab 11/04/23 0445  AST 44*  ALT 72*  ALKPHOS 80  BILITOT 0.4   PROT 6.5  ALBUMIN 2.0*   No results for input(s): LIPASE, AMYLASE in the last 168 hours. No results for input(s): AMMONIA in the last 168 hours. Coagulation Profile: No results for input(s): INR, PROTIME in the last 168 hours. Cardiac Enzymes: No results for input(s): CKTOTAL, CKMB, CKMBINDEX, TROPONINI in the last 168 hours. BNP (last 3 results) No results for input(s): PROBNP in the last 8760 hours. HbA1C: No results for input(s): HGBA1C in the last 72 hours. CBG: Recent Labs  Lab 11/06/23 0041 11/06/23 0341 11/06/23 0831 11/06/23 1235 11/06/23 1703  GLUCAP 114* 117* 124* 130* 131*   Lipid Profile: No results for input(s): CHOL, HDL, LDLCALC, TRIG, CHOLHDL, LDLDIRECT in the last 72 hours. Thyroid  Function Tests: No results for input(s): TSH, T4TOTAL, FREET4, T3FREE, THYROIDAB in the last 72 hours. Anemia Panel: No results for input(s): VITAMINB12, FOLATE, FERRITIN, TIBC, IRON, RETICCTPCT in the last 72 hours. Most Recent Urinalysis On File:     Component Value Date/Time   COLORURINE YELLOW (A) 10/05/2023 0506   APPEARANCEUR HAZY (A) 10/05/2023 0506   LABSPEC 1.028 10/05/2023 0506   PHURINE 5.0 10/05/2023 0506   GLUCOSEU NEGATIVE 10/05/2023 0506   HGBUR SMALL (A) 10/05/2023 0506   BILIRUBINUR NEGATIVE 10/05/2023 0506   KETONESUR 20 (A) 10/05/2023 0506   PROTEINUR 30 (A) 10/05/2023 0506   NITRITE NEGATIVE 10/05/2023 0506   LEUKOCYTESUR NEGATIVE 10/05/2023 0506   Sepsis Labs: @LABRCNTIP (procalcitonin:4,lacticidven:4) Microbiology: No results found for this or any previous visit (from the past 240 hours).    Radiology Studies last 3 days: CT CHEST ABDOMEN PELVIS WO CONTRAST Result Date: 11/02/2023 CLINICAL DATA:  Leukocytosis, recent dislodged feeding tube, elevated temperature EXAM: CT CHEST, ABDOMEN AND PELVIS WITHOUT CONTRAST TECHNIQUE: Multidetector CT imaging of the chest, abdomen and pelvis was performed  following the standard protocol without IV contrast. RADIATION DOSE REDUCTION: This exam was performed according to the departmental dose-optimization program which includes automated exposure control, adjustment of the mA and/or kV according to patient size and/or use of iterative reconstruction technique. COMPARISON:  10/22/2023 FINDINGS: CT CHEST FINDINGS Cardiovascular: Heart is normal size. Aorta is normal caliber. Three-vessel coronary artery disease. Mediastinum/Nodes: Esophagus is dilated and fluid-filled. This is similar to prior study. No mediastinal, hilar, or axillary adenopathy. Trachea and thyroid  unremarkable. Lungs/Pleura: Trace bilateral pleural effusions. Bronchial wall thickening noted in the lower lobes, right greater than left with airspace opacities in the lower lobes, right greater than left. This could reflect atelectasis or early pneumonia. Musculoskeletal: Chest wall soft tissues are unremarkable. No acute bony abnormality. CT ABDOMEN PELVIS FINDINGS Hepatobiliary: Small gallstone noted in the gallbladder. No biliary ductal dilatation. No focal hepatic abnormality. Pancreas: No focal abnormality or ductal dilatation. Spleen: No focal abnormality.  Normal size. Adrenals/Urinary Tract: Mild bilateral hydronephrosis and hydroureter, similar to prior study. Urinary bladder unremarkable. No renal or adrenal suspicious lesion. Stomach/Bowel: Gastrostomy in place within the distal stomach, unremarkable. Stomach, large and small bowel grossly unremarkable. Vascular/Lymphatic: Aortic atherosclerosis. No evidence of aneurysm or adenopathy. Reproductive: No visible focal abnormality. Other: No free fluid or free air. Musculoskeletal: No acute bony abnormality. IMPRESSION: Dilated, fluid-filled esophagus may be related to dysmotility or reflux. Airway thickening in the lower lobes with airspace opacities in the lower lobes, right greater than left. This  could reflect atelectasis or pneumonia. Three  vessel coronary artery disease, aortic atherosclerosis. Cholelithiasis.  No CT evidence of acute cholecystitis. Gastrostomy tube in the stomach, grossly unremarkable. Mild bilateral hydronephrosis without visible obstructing stones. Small amount of free fluid in the cul-de-sac. Electronically Signed   By: Janeece Mechanic M.D.   On: 11/02/2023 20:39       Melodi Sprung, DO [facility] 11/06/2023, 5:32 PM    Dictation software may have been used to generate the above note. Typos may occur and escape review in typed/dictated notes. Please contact Dr Authur Leghorn directly for clarity if needed.  Staff may message me via secure chat in Epic  but this may not receive an immediate response,  please page me for urgent matters!  If 7PM-7AM, please contact night coverage [email or web address]

## 2023-11-06 NOTE — TOC Progression Note (Signed)
 Transition of Care [facility]) - Progression Note    Patient Details  Name: Robert Vega MRN: [id number] Date of Birth: Dec 11, 1940  Transition of Care [facility]) CM/SW Contact  Loman Risk, RN Phone Number: 11/06/2023, 10:38 AM  Clinical Narrative:     Oncoming MD notified that auth for SNF valid till 6/11 MD to notify [facility] if patient medically ready for discharge        Expected Discharge Plan and Services                                               Social Determinants of Health (SDOH) Interventions SDOH Screenings   Food Insecurity: Patient Unable To Answer (10/06/2023)  Recent Concern: Food Insecurity - Food Insecurity Present (09/11/2023)   Received from [facility]  Housing: Patient Unable To Answer (10/06/2023)  Recent Concern: Housing - High Risk (09/11/2023)   Received from [facility]  Transportation Needs: Patient Unable To Answer (10/06/2023)  Utilities: Patient Unable To Answer (10/06/2023)  Depression (PHQ2-9): Low Risk  (05/07/2023)  Financial Resource Strain: Medium Risk (09/11/2023)   Received from [facility]  Social Connections: Unknown (10/06/2023)  Tobacco Use: Medium Risk (11/05/2023)    Readmission Risk Interventions     No data to display

## 2023-11-06 NOTE — TOC Progression Note (Signed)
 Transition of Care [facility]) - Progression Note    Patient Details  Name: AKAI DOLLARD MRN: [id number] Date of Birth: Feb 27, 1941  Transition [facility]) CM/SW Contact  Loman Risk, RN Phone Number: 11/06/2023, 4:41 PM  Clinical Narrative:     Isa Manuel with TOC spoke with [facility] rep who states there is a 3 day grace period for auth, and patient has to admit to facility by Saturday at 11:59 pm.  TOC to follow up with [facility] Granddaughter [name] updated       Expected Discharge Plan and Services                                               Social Determinants of Health (SDOH) Interventions SDOH Screenings   Food Insecurity: Patient Unable To Answer (10/06/2023)  Recent Concern: Food Insecurity - Food Insecurity Present (09/11/2023)   Received from [facility]  Housing: Patient Unable To Answer (10/06/2023)  Recent Concern: Housing - High Risk (09/11/2023)   Received from [facility]  Transportation Needs: Patient Unable To Answer (10/06/2023)  Utilities: Patient Unable To Answer (10/06/2023)  Depression (PHQ2-9): Low Risk  (05/07/2023)  Financial Resource Strain: Medium Risk (09/11/2023)   Received from [facility]  Social Connections: Unknown (10/06/2023)  Tobacco Use: Medium Risk (11/05/2023)    Readmission Risk Interventions     No data to display

## 2023-11-06 NOTE — Progress Notes (Signed)
 Patient had upper endoscopy without any sign of any active or recent bleeding yesterday.  The patient's hemoglobin has been stable.  The patient is being monitored because of his increase in his creatinine.  Nothing further to do from a GI point of view and I have discussed the findings of the endoscopy with his POA who is his granddaughter.  Please do not hesitate to call if he should have any further problems.  I will sign off.  Please call if any further GI concerns or questions.  We would like to thank you for the opportunity to participate in the care of Robert Vega.

## 2023-11-06 NOTE — Progress Notes (Signed)
 Physical Therapy Treatment Patient Details Name: Robert Vega MRN: [id number] DOB: 1941/04/25 Today's Date: 11/06/2023   History of Present Illness 83 y.o male with significant PMH of OSA, GERD, Obesity, HTN, Dysphagia: EGD 03/2022 with food in upper esophagus complicated by aspiration event, cardiac arrest with round of CPR, and post resuscitation EGD with concern for lack of peristalsis. Pt presented to ED on 10/05/2023 with hypoxia, fever and generalized weakness; developed acute respiratory failure requiring intubation 5/10 due to aspiration pneumonia, extubated 5/15, but had significant agitation required brief course of Precedex ; pt now s/p exploratory laparotomy with closure of gastrotomy and insertion of gastrostomy tube on 10/22/23    PT Comments  Patient received in bed, he has sitter at bedside. Patient is alert, confused and hyper verbal. Patient requires total assist +2 for bed mobility. Poor ability to follow direction for him to assist with mobility. Patient has poor sitting balance and is unable to progress to attempt to stand. He was also found to be wet and soiled. Bed fully changed. Patient will continue to benefit from skilled PT to improve mobility. Cognition is limiting factor.     If plan is discharge home, recommend the following: Two people to help with walking and/or transfers;Two people to help with bathing/dressing/bathroom;Supervision due to cognitive status;Direct supervision/assist for medications management;Direct supervision/assist for financial management;Assist for transportation   Can travel by private vehicle     No  Equipment Recommendations  Other (comment) (TBD)    Recommendations for Other Services       Precautions / Restrictions Precautions Recall of Precautions/Restrictions: Impaired Precaution/Restrictions Comments: abdominal binder Restrictions Weight Bearing Restrictions Per Provider Order: No     Mobility  Bed Mobility Overal bed  mobility: Needs Assistance Bed Mobility: Rolling, Supine to Sit, Sit to Supine Rolling: +2 for physical assistance, Total assist   Supine to sit: +2 for physical assistance, Total assist, HOB elevated, Used rails Sit to supine: +2 for physical assistance, Total assist, HOB elevated   General bed mobility comments: Requires total assist +2 for rolling in bed and supine to sit. Unable to maintain sitting at edge of bed, unable to use rails effectively despite max cues    Transfers                   General transfer comment: unable to attempt this date    Ambulation/Gait               General Gait Details: unable   Stairs             Wheelchair Mobility     Tilt Bed    Modified Rankin (Stroke Patients Only)       Balance Overall balance assessment: Needs assistance   Sitting balance-Leahy Scale: Zero                                      Communication Communication Communication: Impaired Factors Affecting Communication: Reduced clarity of speech  Cognition Arousal: Alert Behavior During Therapy: Restless   PT - Cognitive impairments: History of cognitive impairments, Awareness, Attention, Initiation, Sequencing, Problem solving, Safety/Judgement   Orientation impairments: Time, Situation                   PT - Cognition Comments: Patient with difficulty following direction this session. Resistant, rigid, requires constant multimodal cues Following commands: Impaired Following commands impaired: Follows one step commands inconsistently  Cueing Cueing Techniques: Verbal cues, Gestural cues, Tactile cues  Exercises Other Exercises Other Exercises: pt noted to be soiled in bed upon arrival. Multiple bouts of rolling to assist with hygiene. Poor ability to follow commands. Whole bed change performed    General Comments        Pertinent Vitals/Pain Pain Assessment Breathing: normal Negative Vocalization:  none Facial Expression: smiling or inexpressive Body Language: relaxed Consolability: no need to console PAINAD Score: 0 Pain Intervention(s): Monitored during session, Repositioned    Home Living                          Prior Function            PT Goals (current goals can now be found in the care plan section) Acute Rehab PT Goals Patient Stated Goal: none stated PT Goal Formulation: Patient unable to participate in goal setting Time For Goal Achievement: 11/20/23 Progress towards PT goals: Not progressing toward goals - comment (confusion continues to limit progress)    Frequency    Min 2X/week      PT Plan      Co-evaluation PT/OT/SLP Co-Evaluation/Treatment: Yes Reason for Co-Treatment: Necessary to address cognition/behavior during functional activity;For patient/therapist safety;To address functional/ADL transfers PT goals addressed during session: Mobility/safety with mobility;Balance        AM-PAC PT 6 Clicks Mobility   Outcome Measure  Help needed turning from your back to your side while in a flat bed without using bedrails?: Total Help needed moving from lying on your back to sitting on the side of a flat bed without using bedrails?: Total Help needed moving to and from a bed to a chair (including a wheelchair)?: Total Help needed standing up from a chair using your arms (e.g., wheelchair or bedside chair)?: Total Help needed to walk in hospital room?: Total Help needed climbing 3-5 steps with a railing? : Total 6 Click Score: 6    End of Session   Activity Tolerance: Other (comment) (cognition) Patient left: in bed;with call bell/phone within reach;with bed alarm set;with nursing/sitter in room Nurse Communication: Mobility status PT Visit Diagnosis: Other abnormalities of gait and mobility (R26.89);Muscle weakness (generalized) (M62.81)     Time: 1010-1028 PT Time Calculation (min) (ACUTE ONLY): 18 min  Charges:      PT  General Charges $$ ACUTE PT VISIT: 1 Visit                     Izic Stfort, PT, GCS 11/06/23,1:04 PM

## 2023-11-07 DIAGNOSIS — R6520 Severe sepsis without septic shock: Secondary | ICD-10-CM | POA: Diagnosis not present

## 2023-11-07 DIAGNOSIS — A419 Sepsis, unspecified organism: Secondary | ICD-10-CM | POA: Diagnosis not present

## 2023-11-07 LAB — CBC
HCT: 26.5 % — ABNORMAL LOW (ref 39.0–52.0)
Hemoglobin: 8.4 g/dL — ABNORMAL LOW (ref 13.0–17.0)
MCH: 30 pg (ref 26.0–34.0)
MCHC: 31.7 g/dL (ref 30.0–36.0)
MCV: 94.6 fL (ref 80.0–100.0)
Platelets: 360 10*3/uL (ref 150–400)
RBC: 2.8 MIL/uL — ABNORMAL LOW (ref 4.22–5.81)
RDW: 14.9 % (ref 11.5–15.5)
WBC: 12.8 10*3/uL — ABNORMAL HIGH (ref 4.0–10.5)
nRBC: 0 % (ref 0.0–0.2)

## 2023-11-07 LAB — GLUCOSE, CAPILLARY
Glucose-Capillary: 155 mg/dL — ABNORMAL HIGH (ref 70–99)
Glucose-Capillary: 138 mg/dL — ABNORMAL HIGH (ref 70–99)
Glucose-Capillary: 129 mg/dL — ABNORMAL HIGH (ref 70–99)
Glucose-Capillary: 130 mg/dL — ABNORMAL HIGH (ref 70–99)
Glucose-Capillary: 113 mg/dL — ABNORMAL HIGH (ref 70–99)

## 2023-11-07 LAB — BASIC METABOLIC PANEL WITH GFR
Anion gap: 9 (ref 5–15)
Anion gap: 9 (ref 5–15)
BUN: 61 mg/dL — ABNORMAL HIGH (ref 8–23)
BUN: 61 mg/dL — ABNORMAL HIGH (ref 8–23)
CO2: 25 mmol/L (ref 22–32)
CO2: 25 mmol/L (ref 22–32)
Calcium: 7.8 mg/dL — ABNORMAL LOW (ref 8.9–10.3)
Calcium: 7.6 mg/dL — ABNORMAL LOW (ref 8.9–10.3)
Chloride: 111 mmol/L (ref 98–111)
Chloride: 110 mmol/L (ref 98–111)
Creatinine, Ser: 2.56 mg/dL — ABNORMAL HIGH (ref 0.61–1.24)
Creatinine, Ser: 2.54 mg/dL — ABNORMAL HIGH (ref 0.61–1.24)
GFR, Estimated: 24 mL/min — ABNORMAL LOW (ref >60)
GFR, Estimated: 24 mL/min — ABNORMAL LOW (ref >60)
Glucose, Bld: 158 mg/dL — ABNORMAL HIGH (ref 70–99)
Glucose, Bld: 135 mg/dL — ABNORMAL HIGH (ref 70–99)
Potassium: 4.5 mmol/L (ref 3.5–5.1)
Potassium: 4.4 mmol/L (ref 3.5–5.1)
Sodium: 145 mmol/L (ref 135–145)
Sodium: 144 mmol/L (ref 135–145)

## 2023-11-07 LAB — IRON AND TIBC
Iron: 21 ug/dL — ABNORMAL LOW (ref 45–182)
Saturation Ratios: 8 % — ABNORMAL LOW (ref 17.9–39.5)
TIBC: 258 ug/dL (ref 250–450)
UIBC: 237 ug/dL

## 2023-11-07 LAB — FERRITIN: Ferritin: 228 ng/mL (ref 24–336)

## 2023-11-07 MED ORDER — PANTOPRAZOLE SODIUM 40 MG PO TBEC: 40.0000 mg | DELAYED_RELEASE_TABLET | Freq: Two times a day (BID) | ORAL | Status: DC

## 2023-11-07 MED ORDER — PANTOPRAZOLE SODIUM 40 MG IV SOLR
40.0000 mg | Freq: Two times a day (BID) | INTRAVENOUS | Status: DC
  Administered 2023-11-07 – 2023-11-10 (×6): 40 mg via INTRAVENOUS
  Filled 2023-11-07 (×6): qty 10

## 2023-11-07 MED ORDER — FREE WATER
400.0000 mL | Status: DC
  Administered 2023-11-07 – 2023-11-08 (×4): 400 mL

## 2023-11-07 NOTE — Progress Notes (Addendum)
 Spoke w/ granddaughter 11/07/23 5:19 PM  She is requesting for clear liquid diet, we discussed risks/benefits. I put in for clears w/ nectar thick and pt should only take po w/ close supervision Spoke w/ Dr Rhesa Celeste nephrology will see pt tomorrow  Increased free water  again to 400 mL q4h, I advised granddaughter will defer IV fluids to nephrology  She inquired about Hgb level at which PT would be able to work w/ him at facility and is ok w/ transfusion if needed

## 2023-11-07 NOTE — TOC Progression Note (Addendum)
 Transition of Care [facility]) - Progression Note    Patient Details  Name: Robert Vega MRN: [id number] Date of Birth: 12-27-1940  Transition of Care [facility]) CM/SW Contact  Loman Risk, RN Phone Number: 11/07/2023, 2:19 PM  Clinical Narrative:     Per Lindaann Requena at [facility] if patient medically ready for discharge tomorrow he can admit under the grace period.  She states they will not be accepting admission over the weekend, so if patient does not admit tomorrow will require new auth.  MD and [facility] director aware    Update:  Per [name] with [facility] if patient is ready for dc tomorrow they can admit, but would have to know on Friday about plan for weekend dc    Expected Discharge Plan and Services                                               Social Determinants of Health (SDOH) Interventions SDOH Screenings   Food Insecurity: Patient Unable To Answer (10/06/2023)  Recent Concern: Food Insecurity - Food Insecurity Present (09/11/2023)   Received from [facility]  Housing: Patient Unable To Answer (10/06/2023)  Recent Concern: Housing - High Risk (09/11/2023)   Received from [facility]  Transportation Needs: Patient Unable To Answer (10/06/2023)  Utilities: Patient Unable To Answer (10/06/2023)  Depression (PHQ2-9): Low Risk  (05/07/2023)  Financial Resource Strain: Medium Risk (09/11/2023)   Received from [facility]  Social Connections: Unknown (10/06/2023)  Tobacco Use: Medium Risk (11/05/2023)    Readmission Risk Interventions     No data to display

## 2023-11-07 NOTE — Progress Notes (Signed)
 PROGRESS NOTE    Robert Vega   ZOX:[id number] DOB: [birth year]  DOA: 10/05/2023 Date of Service: 11/07/23 which is hospital day 33  PCP: Helaine Llanos, MD    Hospital course / significant events:   HPI: 83 y.o male with significant PMH of OSA, GERD, Obesity, HTN, Dysphagia - presented to the ED 10/05/2023 from with hypoxia, fever and generalized weakness. Dx sepsis/pneumonia, concern for aspiration   Of note, EGD 03/2022 with food in upper esophagus complicated by aspiration event, cardiac arrest with round of CPR, and post resuscitation EGD with concern for lack of peristalsis. Hx rior EGD 08/2020 with note of abnormal cricopharyngeus, decrease in motility in esophagus, and spastic LES who   5/10: Admit to [facility] service with sepsis due to Aspiration Pneumonia.  Course complicated by Acute Respiratory Failure due to Aspiration of vomitus requiring s/p cardiac arrest  transfer to ICU and intubation.  5/11 flexible bronchoscopy done by critical care team 5/12 remains intubated 5/13 remains on vent, s/p PEG tube, +vomiting last night, failed SAT/SBT 5/14 extubated successfully but with severe delirium 5/20.  Botulinum toxin injection into the lower esophageal sphincter by Dr. Ole Berkeley 5/21 esophagram showing diffusely distended esophagus with distal obstruction and severe dysmotility suggestive of achalasia. 5/22.  Advised I would not give him any food and will continue PEG feeding he can try liquids to see if that goes down but still high risk for aspiration.   5/22: fall  5/23.   Awaiting insurance authorization for rehab. 5/24.  Tolerating some clear luquids\ /25.  Patient pulled out PEG tube this morning.  Foley catheter placed in the stoma.  GI able to place a another PEG tube and remove the Foley. 5/26.  Creatinine up at 1.83.  Continue IV fluid hydration.  Patient started having pain after tube feeding started.  CT scan showed that the PEG tube was not in the correct place.   Notified GI.  Started empiric antibiotics. 5/27.  Patient was taken urgently to the operating room by Dr. Cornel Diesel because the patient had fever and elevated white count with suspected intra-abdominal sepsis. 5/28: restart tube feeds  05/30: advancing tube feeds, remains on Zosyn   06/01: on and off AKI 06/04: family working on decision for SNF  06/07: repeat CT abd d/t higher WBC, holding tube feeds but CT nothing acute  06/09: remains stable, pending SNF auth. New melena  06/10: GI repeat EGD, no bleeding  06/11: GI nothing else to add and HH stable GI has s/o, Cr up a bit, increasing free water , granddaughter requests reeval from SLP will ask them to see him tm  06/12:             ASSESSMENT & PLAN:   Melena Anemia Repeat EGD 06/10 no concerns, GI has s/o HH has been stable Follow CBC    Severe sepsis - resolved  Initially w/ aspiration pna Later in hospital stay w/ intra-abdominal infection secondary to PEG tube not being in the right place Completed abx  Achalasia Dysphagia Aspiration risk is high  Status post botulinum toxin injection on 5/20.   Continue tube feeds N.p.o. for the foreseeable future per GI - discussed risk/benefit w/ granddaughter. Advised granddaughter that if she decides for po, she must accept risk for aspiration  SLP to follow  Will need outpatient referral to tertiary advanced GI for consideration of POEMS procedure.  Recommend this only after a few weeks recovery period.    AKI (acute kidney injury) (HCC) with possible  progression to ckd 3b uop adequate, not on nephrotoxic agents. May have progressed to ckd 3b Increased free water  admin via PEG Monitor BMP   Delirium Agitation/sundowning Granddaughter has asked to discontinue prn antipsychotics  GERD (gastroesophageal reflux disease) Continue PPI   Acute respiratory failure with hypoxia and hypercapnia (HCC) Pneumonia Now resolved   Essential hypertension, benign Bp wnl off meds     Generalized weakness Therapy as tolerated Will benefit from placement to skilled nursing facility, family is deciding on a facility     Class 1 obesity based on BMI: Body mass index is 32.44 kg/m.Aaron Aas Significantly low or high BMI is associated with higher medical risk.  Underweight - under 18  overweight - 25 to 29 obese - 30 or more Class 1 obesity: BMI of 30.0 to 34 Class 2 obesity: BMI of 35.0 to 39 Class 3 obesity: BMI of 40.0 to 49 Super Morbid Obesity: BMI 50-59 Super-super Morbid Obesity: BMI 60+ Healthy nutrition and physical activity advised as adjunct to other disease management and risk reduction treatments    DVT prophylaxis: none w/ anemia IV fluids: no continuous IV fluids  Nutrition: tube feeds, granddaughter requests SLP eval to reconsider for CLD see above Central lines / other devices: PEG  Code Status: FULL CODE ACP documentation reviewed:  none on file in [facility]  TOC needs: placement Medical barriers to dispo: AKI. Expected medical readiness for discharge 1-2 days.               Subjective / Brief ROS:  Patient reports no concerns Denies CP/SOB.  Pain controlled.    Family Communication: will call granddaughter later today     Objective Findings:  Vitals:   11/06/23 2011 11/07/23 0428 11/07/23 0510 11/07/23 0817  BP: 136/66  126/68 (!) 146/69  Pulse: 89  92 93  Resp: 20  18 17   Temp: 97.6 F (36.4 C)  98.7 F (37.1 C) 98.1 F (36.7 C)  TempSrc:   Oral Oral  SpO2: 96%  93% 92%  Weight:  99.8 kg    Height:        Intake/Output Summary (Last 24 hours) at 11/07/2023 1557 Last data filed at 11/07/2023 1036 Gross per 24 hour  Intake 720 ml  Output 2100 ml  Net -1380 ml   Filed Weights   11/05/23 0500 11/06/23 0441 11/07/23 0428  Weight: 101.8 kg 99.7 kg 99.8 kg    Examination:  Physical Exam Constitutional:      General: He is not in acute distress.    Appearance: He is not ill-appearing.   Cardiovascular:     Rate  and Rhythm: Normal rate and regular rhythm.  Pulmonary:     Effort: Pulmonary effort is normal.     Breath sounds: Normal breath sounds.  Abdominal:     Palpations: Abdomen is soft. There is no mass.     Tenderness: There is no abdominal tenderness.   Neurological:     Mental Status: He is alert. Mental status is at baseline. He is disoriented.   Psychiatric:        Mood and Affect: Mood normal.          Scheduled Medications:   feeding supplement (PROSource TF20)  60 mL Per Tube BID   free water   300 mL Per Tube Q4H   gentamicin  ointment   Topical TID   insulin  aspart  0-9 Units Subcutaneous Q4H   mouth rinse  15 mL Mouth Rinse 4 times per day  pantoprazole  (PROTONIX ) IV  40 mg Intravenous Q12H    Continuous Infusions:  feeding supplement (OSMOLITE 1.5 CAL) 1,000 mL (11/06/23 1241)    PRN Medications:  acetaminophen , acetaminophen , bisacodyl , haloperidol , hydrALAZINE , ipratropium-albuterol , [DISCONTINUED] ondansetron  **OR** ondansetron  (ZOFRAN ) IV, mouth rinse, mouth rinse  Antimicrobials from admission:  Anti-infectives (From admission, onward)    Start     Dose/Rate Route Frequency Ordered Stop   10/21/23 2200  Ampicillin -Sulbactam (UNASYN ) 3 g in sodium chloride  0.9 % 100 mL IVPB  Status:  Discontinued        3 g 200 mL/hr over 30 Minutes Intravenous Every 6 hours 10/21/23 2011 10/21/23 2014   10/21/23 2200  piperacillin -tazobactam (ZOSYN ) IVPB 3.375 g        3.375 g 12.5 mL/hr over 240 Minutes Intravenous Every 8 hours 10/21/23 2019 10/26/23 2359   10/06/23 1600  cefTRIAXone  (ROCEPHIN ) 2 g in sodium chloride  0.9 % 100 mL IVPB        2 g 200 mL/hr over 30 Minutes Intravenous Every 24 hours 10/06/23 1415 10/09/23 1749   10/06/23 0600  piperacillin -tazobactam (ZOSYN ) IVPB 3.375 g  Status:  Discontinued        3.375 g 12.5 mL/hr over 240 Minutes Intravenous Every 8 hours 10/06/23 0356 10/06/23 1415   10/06/23 0500  cefTRIAXone  (ROCEPHIN ) 2 g in sodium chloride   0.9 % 100 mL IVPB  Status:  Discontinued        2 g 200 mL/hr over 30 Minutes Intravenous Every 24 hours 10/05/23 0750 10/06/23 0341   10/06/23 0500  azithromycin  (ZITHROMAX ) 500 mg in sodium chloride  0.9 % 250 mL IVPB  Status:  Discontinued        500 mg 250 mL/hr over 60 Minutes Intravenous Every 24 hours 10/05/23 0750 10/06/23 1409   10/05/23 0615  metroNIDAZOLE  (FLAGYL ) IVPB 500 mg        500 mg 100 mL/hr over 60 Minutes Intravenous  Once 10/05/23 0611 10/05/23 0910   10/05/23 0545  cefTRIAXone  (ROCEPHIN ) 2 g in sodium chloride  0.9 % 100 mL IVPB        2 g 200 mL/hr over 30 Minutes Intravenous Once 10/05/23 0533 10/05/23 0650   10/05/23 0545  azithromycin  (ZITHROMAX ) 500 mg in sodium chloride  0.9 % 250 mL IVPB        500 mg 250 mL/hr over 60 Minutes Intravenous  Once 10/05/23 0533 10/05/23 0805           Data Reviewed:  I have personally reviewed the following...  CBC: Recent Labs  Lab 11/03/23 0431 11/04/23 1251 11/05/23 0407 11/06/23 0500 11/07/23 0446  WBC 15.4* 13.2* 12.6* 10.7* 12.8*  HGB 8.0* 7.8* 7.5* 8.8* 8.4*  HCT 24.6* 23.9* 23.0* 26.7* 26.5*  MCV 96.1 97.2 96.2 92.1 94.6  PLT 304 309 307 319 360   Basic Metabolic Panel: Recent Labs  Lab 11/02/23 0357 11/03/23 0431 11/04/23 0445 11/06/23 0500 11/07/23 0446  NA 146* 138 139 143 145  K 4.1 4.1 4.0 4.3 4.5  CL 110 104 106 110 111  CO2 28 25 24 24 25   GLUCOSE 129* 121* 140* 106* 158*  BUN 56* 59* 55* 53* 61*  CREATININE 2.04* 2.05* 1.96* 2.33* 2.56*  CALCIUM 7.7* 7.6* 7.5* 7.9* 7.8*   GFR: Estimated Creatinine Clearance: 25 mL/min (A) (by C-G formula based on SCr of 2.56 mg/dL (H)). Liver Function Tests: Recent Labs  Lab 11/04/23 0445  AST 44*  ALT 72*  ALKPHOS 80  BILITOT 0.4  PROT 6.5  ALBUMIN  2.0*   No results for input(s): LIPASE, AMYLASE in the last 168 hours. No results for input(s): AMMONIA in the last 168 hours. Coagulation Profile: No results for input(s): INR,  PROTIME in the last 168 hours. Cardiac Enzymes: No results for input(s): CKTOTAL, CKMB, CKMBINDEX, TROPONINI in the last 168 hours. BNP (last 3 results) No results for input(s): PROBNP in the last 8760 hours. HbA1C: No results for input(s): HGBA1C in the last 72 hours. CBG: Recent Labs  Lab 11/06/23 2125 11/06/23 2347 11/07/23 0505 11/07/23 0759 11/07/23 1209  GLUCAP 124* 116* 155* 138* 129*   Lipid Profile: No results for input(s): CHOL, HDL, LDLCALC, TRIG, CHOLHDL, LDLDIRECT in the last 72 hours. Thyroid  Function Tests: No results for input(s): TSH, T4TOTAL, FREET4, T3FREE, THYROIDAB in the last 72 hours. Anemia Panel: Recent Labs    11/07/23 0445  FERRITIN 228  TIBC 258  IRON 21*   Most Recent Urinalysis On File:     Component Value Date/Time   COLORURINE YELLOW (A) 10/05/2023 0506   APPEARANCEUR HAZY (A) 10/05/2023 0506   LABSPEC 1.028 10/05/2023 0506   PHURINE 5.0 10/05/2023 0506   GLUCOSEU NEGATIVE 10/05/2023 0506   HGBUR SMALL (A) 10/05/2023 0506   BILIRUBINUR NEGATIVE 10/05/2023 0506   KETONESUR 20 (A) 10/05/2023 0506   PROTEINUR 30 (A) 10/05/2023 0506   NITRITE NEGATIVE 10/05/2023 0506   LEUKOCYTESUR NEGATIVE 10/05/2023 0506   Sepsis Labs: @LABRCNTIP (procalcitonin:4,lacticidven:4) Microbiology: No results found for this or any previous visit (from the past 240 hours).    Radiology Studies last 3 days: No results found.      Ulys Favia, DO [facility] 11/07/2023, 3:57 PM    Dictation software may have been used to generate the above note. Typos may occur and escape review in typed/dictated notes. Please contact Dr Authur Leghorn directly for clarity if needed.  Staff may message me via secure chat in Epic  but this may not receive an immediate response,  please page me for urgent matters!  If 7PM-7AM, please contact night coverage [email or web address]

## 2023-11-07 NOTE — Evaluation (Signed)
 Clinical/Bedside Swallow Evaluation Patient Details  Name: Robert Vega MRN: [id number] Date of Birth: 11-30-1940  Today's Date: 11/07/2023 Time: SLP Start Time (ACUTE ONLY): 0920 SLP Stop Time (ACUTE ONLY): 1010 SLP Time Calculation (min) (ACUTE ONLY): 50 min  Past Medical History:  Past Medical History:  Diagnosis Date   Allergy    Arthritis    BPH (benign prostatic hypertrophy)    Colonic polyp    GERD (gastroesophageal reflux disease)    Has LPR   Hypertension    Past Surgical History:  Past Surgical History:  Procedure Laterality Date   CATARACT EXTRACTION     2011, other in 2013   COLONOSCOPY  2011   CREATION, GASTROSTOMY, OPEN N/A 10/22/2023   Procedure: CREATION, GASTROSTOMY, OPEN; GASTROSTOMY CLOSURE;  Surgeon: Barrett Lick, MD;  Location: [facility] ORS;  Service: General;  Laterality: N/A;   ESOPHAGOGASTRODUODENOSCOPY N/A 10/15/2023   Procedure: EGD (ESOPHAGOGASTRODUODENOSCOPY);  Surgeon: Marnee Sink, MD;  Location: [facility] ENDOSCOPY;  Service: Endoscopy;  Laterality: N/A;  WILL NEED BOTOX    ESOPHAGOGASTRODUODENOSCOPY N/A 11/05/2023   Procedure: EGD (ESOPHAGOGASTRODUODENOSCOPY);  Surgeon: Marnee Sink, MD;  Location: [facility] ENDOSCOPY;  Service: Endoscopy;  Laterality: N/A;   ESOPHAGOGASTRODUODENOSCOPY (EGD) WITH PROPOFOL  N/A 08/30/2020   Procedure: ESOPHAGOGASTRODUODENOSCOPY (EGD) WITH PROPOFOL ;  Surgeon: Luke Salaam, MD;  Location: [facility] ENDOSCOPY;  Service: Gastroenterology;  Laterality: N/A;   ESOPHAGOGASTRODUODENOSCOPY (EGD) WITH PROPOFOL  N/A 04/02/2022   Procedure: ESOPHAGOGASTRODUODENOSCOPY (EGD) WITH PROPOFOL ;  Surgeon: Luke Salaam, MD;  Location: [facility] ENDOSCOPY;  Service: Gastroenterology;  Laterality: N/A;   LAPAROTOMY N/A 10/22/2023   Procedure: LAPAROTOMY, EXPLORATORY;  Surgeon: Barrett Lick, MD;  Location: [facility] ORS;  Service: General;  Laterality: N/A;   PEG PLACEMENT N/A 10/08/2023   Procedure: INSERTION, PEG TUBE;  Surgeon: Marnee Sink, MD;  Location: [facility]  ENDOSCOPY;  Service: Endoscopy;  Laterality: N/A;   skin cancer removal     HPI:  Pt is a 83 y.o. male with medical history significant of GERD, Esophageal dysphagia, large Hiatal Hernia, HTN, BPH, Mood Disorder and Hiatal Hernia who is admitted with aspiration event-  Regurgitation, PNA, sepsis, cardiac arrest w/ acute febrile hypoxia, sepsis  and pneumonia.   CXR 10/05/23: Bilateral lower lobe airspace opacities, right greater than left. Findings are concerning for pneumonia. Pulmonary vascular congestion.  CT of Chest on 11/02/2023: Dilated, fluid-filled esophagus may be related to dysmotility or  reflux.  Airway thickening in the lower lobes with airspace opacities in the  lower lobes, right greater than left. This could reflect atelectasis  or pneumonia.  Three vessel coronary artery disease, aortic atherosclerosis.     Cholelithiasis.  No CT evidence of acute cholecystitis.     Gastrostomy tube in the stomach, grossly unremarkable.    TIMELINE of events during this admit:  5/13- s/p EGD- found to have food in the entire esophagus and gastric polyps. PEG tube placed    5/14- extubated   5/15- TF re-started   5/20- s/p EGD- esophageal dilation with botox  injection   5/22- advanced to clear liquid diet   5/24- pt pulled out PEG   5/25- PEG replaced by GI   5/26- KUB revealed some stool in the rectum and paucity of air in the colon, CT revealed G-tube not in the stomach, has spillage of contrast into the peritoneal cavity and moderate amount of pneumoperitoneum, TF d/c    5/27- s/p Exploratory laparotomy, takedown of gastrocutaneous fistula and repair of gastrotomy, placement of gastrostomy tube    5/28- TF resumed  6/10- s/p EGD- revealed dilation of entire Esophagus, mildly Severe esophagitis with no bleeding, staples removed per surgey.    Per chart notes, Per GI notes, no plans to PO intake due to high aspiration intake and end-stage esophageal disease. Pt NPO and receives TF via PEG for sole  source nutrition..      Assessment / Plan / Recommendation  Clinical Impression   Pt seen for BSE today per MD order and request of Family wanting pt to have po intake. Pt alert and agreeable to session- to take po trials. Pt has Bilat Mitts on; Sitter present. Pt often repeated something said w/ mumbled speech.  On RA, afebrile.   Per chart notes, pt was seen for Bedside eval on 10/05/2023 w/ oropharyngeal ohase swallowing WFL: Pt seen today with trials of thin liquids (via straw), puree, and regular solids. No overt or subtle s/sx pharyngeal dysphagia noted. No change to vocal quality across trials. Vitals stable for duration of trials (on 6L nasal canula with O2 saturations maintained at 94-97). Oral phase grossly intact.. Also noted, pt's MBSS on 05/2023 revealed similar -- NO laryngeal penetration nor aspiration on the MBSS.  Per chart/GI notes: EGD 03/2022 with food in upper Esophagus complicated by aspiration event, cardiac arrest with round of CPR, and post resuscitation EGD with concern for lack of peristalsis. Prior EGD 08/2020 with note of abnormal cricopharyngeus, decrease in motility in esophagus, and spastic LES. Dilated to 18mm without change. Concern for ineffective esophageal motility..  Pt has reported: patient notes that when he lays down, he will in most cases start coughing and this can progress to emesis..   During this admit, pt's course was complicated by ARF d/t aspiration of Vomitus s/p cardiac arrest and transfer to CCU. Pt was successfully extubated ~3 days later; s/p PEG placement.  On 10/15/2023: Botulinum toxin injection into the lower esophageal sphincter by Dr. Ole Berkeley, GI.  Per MD and Imaging notes:  10/16/2023 Esophagram showing diffusely distended Esophagus with distal obstruction and severe dysmotility suggestive of Achalasia. CT of Chest/Abd on 11/02/2023: Dilated, fluid-filled esophagus may be related to dysmotility or reflux.  Airway thickening in the lower  lobes with airspace opacities in the lower lobes, right greater than left. This could reflect atelectasis or pneumonia..  Pt appears to present w/ grossly functional oropharyngeal phase swallowing w/ No overt oropharyngeal phase dysphagia noted, No neuromuscular deficits noted. Pt consumed po trials w/ No overt, clinical s/s of aspiration during the po trials given.  Pt appears at reduced risk for aspiration following general aspiration precautions. However, pt has Multiple challenging factors that could impact oropharyngeal swallowing to include: KNOWN ESOPHAGEAL PHASE DYSMOTILITY W/ REGURGITATION AND ASPIRATION OF REFLUX MATERIAL, NPO STATUS RECOMMENDED BY GI(per chart notes), deconditioning/weakness, advanced age, required Full feeding support d/t Cognitive decline, and current Cognitive decline/Confusion requiring Mitts and a Sitter in the room. These factors can increase risk for dysphagia as well as decreased oral intake overall. Pt has a PEG w/ TFs for nutrition/hydration support. ANY Dysmotility or Regurgitation of Reflux material can increase risk for aspiration of the Reflux material during Retrograde flow thus impact Voicing and Pulmonary status.  During po trials of thin liquids, ice chips, purees, he consumed all consistencies w/ no overt coughing, decline in vocal quality, or change in respiratory presentation during/post trials. O2 sats 98%. Oral phase appeared grossly [facility] w/ timely bolus management, mastication of ice chips(munchy, lengthy), and control of bolus propulsion for A-P transfer for swallowing. Min impulsive when drinking  requiring monitoring. Oral clearing achieved w/ all trial consistencies. OM Exam was cursory but appeared [facility] w/ no unilateral weakness noted. Speech Clear, mumbled. Pt required full feeding support. No solid foods given.  Pt appears to adequately tolerate thin liquids>puree food consistencies -- carefully monitor straw use for large sips, and pt should help to  Hold Cup when drinking. Recommend general aspiration precautions, Full Supervision and Feeding support w/ all oral intake- 100% Supervision w/ oral intake. Small bites/sips Slowly. Strict REFLUX precautions. Pills Crushed in Puree for safer, easier clearing/swallowing.   At this time, ST services will defer to GI/Surgery for initiation of any oral diet consistency d/t pt's Multiple GI/Esophageal Comorbidities.  Education given on Pills in Puree; diet consistency; general aspiration precautions to MD, NSG. Pt would need to follow STRICT REFLUX PRECAUTIONS as at Baseline. MD to reconsult if any new needs arise. NSG updated, agreed. Recommend Dietician f/u for support.  SLP Visit Diagnosis: Dysphagia, unspecified (R13.10) (Known Esophageal phase Dysmotility; PEG placement; GI/Surgery following; Cognitive decline)    Aspiration Risk   (minimal risk from an oropharyngeal phase standpoint; moderate risk from an Esophageal REFLUX aspiration standpoint)    Diet Recommendation    (could initiate an oral diet per GI ok-- liquids, purees) = thin liquids>puree food consistencies -- carefully monitor straw use for large sips, and pt should help to Hold Cup when drinking. Recommend general aspiration precautions, Full Supervision and Feeding support w/ all oral intake- 100% Supervision w/ oral intake. Small bites/sips Slowly. Strict REFLUX precautions.  Medication Administration: Crushed with puree    Other  Recommendations Recommended Consults: Consider GI evaluation;Consider esophageal assessment (following; Dietician- PEG TFs) Oral Care Recommendations: Oral care BID;Oral care before and after PO;Staff/trained caregiver to provide oral care     Assistance Recommended at Discharge  FULL d/t Cognitive decline, Confusion  Functional Status Assessment Patient has had a recent decline in their functional status and/or demonstrates limited ability to make significant improvements in function in a reasonable and  predictable amount of time  Frequency and Duration  (n/a)   (n/a)       Prognosis Prognosis for improved oropharyngeal function: Fair Barriers to Reach Goals: Cognitive deficits;Time post onset;Severity of deficits;Behavior Barriers/Prognosis Comment: Known Esophageal phase Dysmotility; PEG placement; GI/Surgery following; Cognitive decline- Sitter      Swallow Study   General Date of Onset: 10/05/23 HPI: Pt is a 83 y.o. male with medical history significant of GERD, Esophageal dysphagia, HTN, BPH, Mood Disorder and Hiatal Hernia who is admitted with aspiration event-  Regurgitation, PNA, sepsis, cardiac arrest w/ acute febrile hypoxia, sepsis  and pneumonia.   CXR 10/05/23: Bilateral lower lobe airspace opacities, right greater than left. Findings are concerning for pneumonia. Pulmonary vascular congestion.  CT of Chest on 11/02/2023: Dilated, fluid-filled esophagus may be related to dysmotility or  reflux.  Airway thickening in the lower lobes with airspace opacities in the  lower lobes, right greater than left. This could reflect atelectasis  or pneumonia.  Three vessel coronary artery disease, aortic atherosclerosis.     Cholelithiasis.  No CT evidence of acute cholecystitis.     Gastrostomy tube in the stomach, grossly unremarkable.   TIMELINE of events during this admit: 5/13- s/p EGD- found to have food in the entire esophagus and gastric polyps. PEG tube placed   5/14- extubated  5/15- TF re-started  5/20- s/p EGD- esophageal dilation with botox  injection  5/22- advanced to clear liquid diet  5/24- pt pulled out PEG  5/25-  PEG replaced by GI  5/26- KUB revealed some stool in the rectum and paucity of air in the colon, CT revealed G-tube not in the stomach, has spillage of contrast into the peritoneal cavity and moderate amount of pneumoperitoneum, TF d/c   5/27- s/p Exploratory laparotomy, takedown of gastrocutaneous fistula and repair of gastrotomy, placement of gastrostomy tube   5/28- TF  resumed  6/10- s/p EGD- revealed dilation of entire esophagus, mildly severe esophagitis with no bleeding, staples removed per surgey.   Per chart notes, Per GI notes, no plans to PO intake due to high aspiration intake and end-stage esophageal disease. Pt NPO and receives TF via PEG for sole source nutrition.. Type of Study: Bedside Swallow Evaluation Previous Swallow Assessment: MBSS 06/05/23; BSE 10/05/2023 (NO oropharyngeal phase dysphagia noted at assessments) Diet Prior to this Study: NPO;G-tube (TFs) Temperature Spikes Noted: No (wbc 12.8) Respiratory Status: Room air History of Recent Intubation: No Behavior/Cognition: Alert;Cooperative;Pleasant mood;Confused;Distractible;Requires cueing;Doesn't follow directions Psychiatrist) Oral Cavity Assessment: Dry Oral Care Completed by SLP: Yes Oral Cavity - Dentition: Poor condition;Missing dentition Vision:  (n/a) Self-Feeding Abilities: Total assist (confusion) Patient Positioning: Upright in bed (MAX assist) Baseline Vocal Quality: Normal;Low vocal intensity (mumbled at times) Volitional Cough: Cognitively unable to elicit Volitional Swallow: Unable to elicit    Oral/Motor/Sensory Function Overall Oral Motor/Sensory Function: Within functional limits (no unilateral weakness noted)   Ice Chips Ice chips: Within functional limits (grossly) Presentation: Spoon (fed; 5 trials) Other Comments: masticated pieces lengthy time   Thin Liquid Thin Liquid: Within functional limits Presentation: Straw (~6 ozs total) Other Comments: water , juice    Nectar Thick Nectar Thick Liquid: Not tested   Honey Thick Honey Thick Liquid: Not tested   Puree Puree: Within functional limits Presentation: Spoon (fed; 5 trials)   Solid     Solid: Not tested Other Comments: GI reason       Darla Edward, MS, CCC-SLP Speech Language Pathologist Rehab Services; [facility] [phone] (ascom) Jamyah Folk 11/07/2023,2:58 PM

## 2023-11-07 NOTE — Plan of Care (Signed)
  Problem: Education: Goal: Knowledge of General Education information will improve Description: Including pain rating scale, medication(s)/side effects and non-pharmacologic comfort measures Outcome: Progressing   Problem: Health Behavior/Discharge Planning: Goal: Ability to manage health-related needs will improve Outcome: Progressing   Problem: Clinical Measurements: Goal: Ability to maintain clinical measurements within normal limits will improve Outcome: Progressing Goal: Will remain free from infection Outcome: Progressing Goal: Diagnostic test results will improve Outcome: Progressing Goal: Respiratory complications will improve Outcome: Progressing Goal: Cardiovascular complication will be avoided Outcome: Progressing   Problem: Activity: Goal: Risk for activity intolerance will decrease Outcome: Progressing   Problem: Nutrition: Goal: Adequate nutrition will be maintained Outcome: Progressing   Problem: Coping: Goal: Level of anxiety will decrease Outcome: Progressing   Problem: Elimination: Goal: Will not experience complications related to bowel motility Outcome: Progressing Goal: Will not experience complications related to urinary retention Outcome: Progressing   Problem: Pain Managment: Goal: General experience of comfort will improve and/or be controlled Outcome: Progressing   Problem: Safety: Goal: Ability to remain free from injury will improve Outcome: Progressing   Problem: Skin Integrity: Goal: Risk for impaired skin integrity will decrease Outcome: Progressing   Problem: Activity: Goal: Ability to tolerate increased activity will improve Outcome: Progressing   Problem: Clinical Measurements: Goal: Ability to maintain a body temperature in the normal range will improve Outcome: Progressing   Problem: Respiratory: Goal: Ability to maintain adequate ventilation will improve Outcome: Progressing Goal: Ability to maintain a clear airway  will improve Outcome: Progressing   Problem: Education: Goal: Ability to describe self-care measures that may prevent or decrease complications (Diabetes Survival Skills Education) will improve Outcome: Progressing Goal: Individualized Educational Video(s) Outcome: Progressing   Problem: Coping: Goal: Ability to adjust to condition or change in health will improve Outcome: Progressing   Problem: Fluid Volume: Goal: Ability to maintain a balanced intake and output will improve Outcome: Progressing   Problem: Health Behavior/Discharge Planning: Goal: Ability to identify and utilize available resources and services will improve Outcome: Progressing Goal: Ability to manage health-related needs will improve Outcome: Progressing   Problem: Metabolic: Goal: Ability to maintain appropriate glucose levels will improve Outcome: Progressing   Problem: Nutritional: Goal: Maintenance of adequate nutrition will improve Outcome: Progressing Goal: Progress toward achieving an optimal weight will improve Outcome: Progressing   Problem: Skin Integrity: Goal: Risk for impaired skin integrity will decrease Outcome: Progressing   Problem: Tissue Perfusion: Goal: Adequacy of tissue perfusion will improve Outcome: Progressing   Problem: Activity: Goal: Ability to tolerate increased activity will improve Outcome: Progressing   Problem: Respiratory: Goal: Ability to maintain a clear airway and adequate ventilation will improve Outcome: Progressing   Problem: Role Relationship: Goal: Method of communication will improve Outcome: Progressing   Problem: Fluid Volume: Goal: Hemodynamic stability will improve Outcome: Progressing   Problem: Clinical Measurements: Goal: Diagnostic test results will improve Outcome: Progressing Goal: Signs and symptoms of infection will decrease Outcome: Progressing   Problem: Respiratory: Goal: Ability to maintain adequate ventilation will  improve Outcome: Progressing

## 2023-11-08 ENCOUNTER — Inpatient Hospital Stay

## 2023-11-08 DIAGNOSIS — R0989 Other specified symptoms and signs involving the circulatory and respiratory systems: Secondary | ICD-10-CM | POA: Diagnosis not present

## 2023-11-08 DIAGNOSIS — R6520 Severe sepsis without septic shock: Secondary | ICD-10-CM | POA: Diagnosis not present

## 2023-11-08 DIAGNOSIS — I509 Heart failure, unspecified: Secondary | ICD-10-CM | POA: Diagnosis not present

## 2023-11-08 DIAGNOSIS — R918 Other nonspecific abnormal finding of lung field: Secondary | ICD-10-CM | POA: Diagnosis not present

## 2023-11-08 DIAGNOSIS — A419 Sepsis, unspecified organism: Secondary | ICD-10-CM | POA: Diagnosis not present

## 2023-11-08 DIAGNOSIS — J90 Pleural effusion, not elsewhere classified: Secondary | ICD-10-CM | POA: Diagnosis not present

## 2023-11-08 LAB — GLUCOSE, CAPILLARY
Glucose-Capillary: 111 mg/dL — ABNORMAL HIGH (ref 70–99)
Glucose-Capillary: 117 mg/dL — ABNORMAL HIGH (ref 70–99)
Glucose-Capillary: 122 mg/dL — ABNORMAL HIGH (ref 70–99)
Glucose-Capillary: 128 mg/dL — ABNORMAL HIGH (ref 70–99)
Glucose-Capillary: 131 mg/dL — ABNORMAL HIGH (ref 70–99)
Glucose-Capillary: 136 mg/dL — ABNORMAL HIGH (ref 70–99)

## 2023-11-08 LAB — BASIC METABOLIC PANEL WITH GFR
Anion gap: 9 (ref 5–15)
BUN: 66 mg/dL — ABNORMAL HIGH (ref 8–23)
CO2: 26 mmol/L (ref 22–32)
Calcium: 7.7 mg/dL — ABNORMAL LOW (ref 8.9–10.3)
Chloride: 110 mmol/L (ref 98–111)
Creatinine, Ser: 2.69 mg/dL — ABNORMAL HIGH (ref 0.61–1.24)
GFR, Estimated: 23 mL/min — ABNORMAL LOW (ref >60)
Glucose, Bld: 140 mg/dL — ABNORMAL HIGH (ref 70–99)
Potassium: 4.6 mmol/L (ref 3.5–5.1)
Sodium: 145 mmol/L (ref 135–145)

## 2023-11-08 LAB — CBC
HCT: 26.5 % — ABNORMAL LOW (ref 39.0–52.0)
Hemoglobin: 8.5 g/dL — ABNORMAL LOW (ref 13.0–17.0)
MCH: 30.7 pg (ref 26.0–34.0)
MCHC: 32.1 g/dL (ref 30.0–36.0)
MCV: 95.7 fL (ref 80.0–100.0)
Platelets: 340 10*3/uL (ref 150–400)
RBC: 2.77 MIL/uL — ABNORMAL LOW (ref 4.22–5.81)
RDW: 14.8 % (ref 11.5–15.5)
WBC: 13.4 10*3/uL — ABNORMAL HIGH (ref 4.0–10.5)
nRBC: 0 % (ref 0.0–0.2)

## 2023-11-08 MED ORDER — HALOPERIDOL LACTATE 2 MG/ML PO CONC: 2.0000 mg | Freq: Three times a day (TID) | ORAL | Status: DC | PRN

## 2023-11-08 MED ORDER — FREE WATER
200.0000 mL | Status: DC
  Administered 2023-11-08 – 2023-11-10 (×24): 200 mL

## 2023-11-08 MED ORDER — ACETAMINOPHEN 325 MG PO TABS: 650.0000 mg | ORAL_TABLET | Freq: Four times a day (QID) | ORAL | Status: DC | PRN

## 2023-11-08 MED ORDER — SODIUM CHLORIDE 0.9 % IV SOLN: INTRAVENOUS | Status: AC

## 2023-11-08 MED ORDER — OSMOLITE 1.5 CAL PO LIQD
1000.0000 mL | ORAL | Status: DC
  Administered 2023-11-08 – 2023-11-09 (×4): 1000 mL

## 2023-11-08 NOTE — Progress Notes (Signed)
 OT Cancellation Note  Patient Details Name: Robert Vega MRN: [id number] DOB: 25-Dec-1940   Cancelled Treatment:    Reason Eval/Treat Not Completed: Other (comment). Staff with pt/family in room. Will re-attempt at later date/time as pt is available.   Aime Meloche R., MPH, MS, OTR/L ascom [phone] 11/08/23, 11:28 AM

## 2023-11-08 NOTE — Consult Note (Signed)
 [facility] CONSULT NOTE    Date: 11/08/2023                  Patient Name:  Robert Vega  MRN: [id number]  DOB: Dec 29, 1940  Age / Sex: 83 y.o., male         PCP: Helaine Llanos, MD                 Service Requesting Consult: Hospitalist                 Reason for Consult: Acute kidney injury, chronic kidney disease stage IIIb            History of Present Illness: Patient is a 83 y.o. male with a PMHx of obstructive sleep apnea, GERD, obesity, hypertension, dysphagia, severe esophageal dysmotility with achalasia status post PEG tube placement, recent aspiration pneumonia acute respiratory failure status post extubation 10/09/2023, who was admitted to [facility] on 10/05/2023 for evaluation of evaluation of sepsis and pneumonia secondary to aspiration pneumonia.  He has had a rather protracted hospital course.  He originally was admitted due to sepsis from aspiration pneumonia.  He was originally intubated and required mechanical ventilation.  He was extubated on 10/09/2023.  He subsequently had botulinum toxin injection into the lower esophageal sphincter by Dr. Danise Durie on 10/15/2023.  He did had an esophagram showing diffusely distended esophagus and distal obstruction and severe dysmotility suggestive of achalasia.  Patient did have PEG tube placement on 10/08/2023.  He later pulled this out on 10/20/2023.  Thereafter he began developing acute kidney injury.  His baseline creatinine is 1.6 with an EGFR of 40.  Creatinine currently 2.69.  Case discussed with hospitalist as well.   Medications: Outpatient medications: Medications Prior to Admission  Medication Sig Dispense Refill Last Dose/Taking   amLODipine  (NORVASC ) 2.5 MG tablet Take 1 tablet by mouth daily.   10/10/2023 Morning   fluticasone (FLONASE) 50 MCG/ACT nasal spray Place 2 sprays into both nostrils daily.   Taking   acetaminophen  (TYLENOL ) 650 MG CR tablet Take 650 mg by mouth every morning.      aluminum  hydroxide-magnesium carbonate (GAVISCON) 95-358 MG/15ML SUSP Take by mouth.      aspirin  81 MG tablet Take 81 mg by mouth daily.      diltiazem  (CARDIZEM  CD) 120 MG 24 hr capsule Take 120 mg by mouth daily.      docusate sodium  (COLACE) 50 MG capsule Take 50 mg by mouth daily as needed for moderate constipation or mild constipation.      GLUCOSAMINE-CHONDROITIN-MSM PO Take by mouth.      lansoprazole  (PREVACID ) 30 MG capsule TAKE ONE CAPSULE BY MOUTH EVERY NIGHT AT BEDTIME 90 capsule 2    Multiple Vitamins-Minerals (MULTIVITAMIN WITH MINERALS) tablet Take 1 tablet by mouth daily.      tadalafil  (CIALIS ) 20 MG tablet TAKE 1/2 TO 1 TABLET BY MOUTH EVERY OTHER DAY AS NEEDED FOR ERECTILE DYSFUNCTION 5 tablet 11     Current medications: Current Facility-Administered Medications  Medication Dose Route Frequency Provider Last Rate Last Admin   0.9 %  sodium chloride  infusion   Intravenous Continuous Natalyah Cummiskey, MD 50 mL/hr at 11/08/23 1213 New Bag at 11/08/23 1213   acetaminophen  (TYLENOL ) suppository 650 mg  650 mg Rectal Q4H PRN Duncan, Hazel V, MD   650 mg at 11/03/23 0418   acetaminophen  (TYLENOL ) tablet 650 mg  650 mg Oral Q6H PRN Ramonita Burow, [facility]  bisacodyl  (DULCOLAX) suppository 10 mg  10 mg Rectal Daily PRN Donaciano Frizzle, MD   10 mg at 10/26/23 2157   feeding supplement (OSMOLITE 1.5 CAL) liquid 1,000 mL  1,000 mL Per Tube Continuous Alexander, Natalie, DO 65 mL/hr at 11/08/23 1202 1,000 mL at 11/08/23 1202   free water  200 mL  200 mL Per Tube Q2H Melodi Sprung, DO   200 mL at 11/08/23 1606   gentamicin  ointment (GARAMYCIN ) 0.1 %   Topical TID Verla Glaze, MD   Given at 11/08/23 0981   haloperidol  (HALDOL ) 2 MG/ML solution 2 mg  2 mg Oral Q8H PRN Ramonita Burow, [facility]       hydrALAZINE  (APRESOLINE ) injection 10 mg  10 mg Intravenous Q6H PRN Marnee Sink, MD   10 mg at 10/11/23 1914   insulin  aspart (novoLOG ) injection 0-9 Units  0-9 Units Subcutaneous Q4H Marnee Sink, MD   1 Units at 11/08/23 1206   ipratropium-albuterol  (DUONEB) 0.5-2.5 (3) MG/3ML nebulizer solution 3 mL  3 mL Nebulization Q4H PRN Donaciano Frizzle, MD       ondansetron  (ZOFRAN ) injection 4 mg  4 mg Intravenous Q6H PRN Marnee Sink, MD   4 mg at 10/05/23 1713   Oral care mouth rinse  15 mL Mouth Rinse PRN Cleve Dale, MD   15 mL at 10/12/23 1600   Oral care mouth rinse  15 mL Mouth Rinse 4 times per day Margery Sheets B, MD   15 mL at 11/08/23 1607   Oral care mouth rinse  15 mL Mouth Rinse PRN Margery Sheets B, MD       pantoprazole  (PROTONIX ) injection 40 mg  40 mg Intravenous Q12H Madelynn Schilder, RPH   40 mg at 11/08/23 7829      Allergies: Allergies  Allergen Reactions   Sulfonamide Derivatives       Past Medical History: Past Medical History:  Diagnosis Date   Allergy    Arthritis    BPH (benign prostatic hypertrophy)    Colonic polyp    GERD (gastroesophageal reflux disease)    Has LPR   Hypertension      Past Surgical History: Past Surgical History:  Procedure Laterality Date   CATARACT EXTRACTION     2011, other in 2013   COLONOSCOPY  2011   CREATION, GASTROSTOMY, OPEN N/A 10/22/2023   Procedure: CREATION, GASTROSTOMY, OPEN; GASTROSTOMY CLOSURE;  Surgeon: Barrett Lick, MD;  Location: [facility] ORS;  Service: General;  Laterality: N/A;   ESOPHAGOGASTRODUODENOSCOPY N/A 10/15/2023   Procedure: EGD (ESOPHAGOGASTRODUODENOSCOPY);  Surgeon: Marnee Sink, MD;  Location[facility] ENDOSCOPY;  Service: Endoscopy;  Laterality: N/A;  WILL NEED BOTOX    ESOPHAGOGASTRODUODENOSCOPY N/A 11/05/2023   Procedure: EGD (ESOPHAGOGASTRODUODENOSCOPY);  Surgeon: Marnee Sink, MD;  Location: [facility] ENDOSCOPY;  Service: Endoscopy;  Laterality: N/A;   ESOPHAGOGASTRODUODENOSCOPY (EGD) WITH PROPOFOL  N/A 08/30/2020   Procedure: ESOPHAGOGASTRODUODENOSCOPY (EGD) WITH PROPOFOL ;  Surgeon: Luke Salaam, MD;  Location: [facility] ENDOSCOPY;  Service: Gastroenterology;  Laterality: N/A;    ESOPHAGOGASTRODUODENOSCOPY (EGD) WITH PROPOFOL  N/A 04/02/2022   Procedure: ESOPHAGOGASTRODUODENOSCOPY (EGD) WITH PROPOFOL ;  Surgeon: Luke Salaam, MD;  Location: [facility] ENDOSCOPY;  Service: Gastroenterology;  Laterality: N/A;   LAPAROTOMY N/A 10/22/2023   Procedure: LAPAROTOMY, EXPLORATORY;  Surgeon: Barrett Lick, MD;  Location: [facility] ORS;  Service: General;  Laterality: N/A;   PEG PLACEMENT N/A 10/08/2023   Procedure: INSERTION, PEG TUBE;  Surgeon: Marnee Sink, MD;  Location: [facility] ENDOSCOPY;  Service: Endoscopy;  Laterality: N/A;   skin cancer removal  Family History: Family History  Problem Relation Age of Onset   Hypertension Mother    Heart disease Neg Hx    Diabetes Neg Hx    Cancer Neg Hx      Social History: Social History   Socioeconomic History   Marital status: Divorced    Spouse name: Not on file   Number of children: 2   Years of education: Not on file   Highest education level: Not on file  Occupational History   Occupation: retired--car dealer/sales  Tobacco Use   Smoking status: Former    Current packs/day: 0.00    Average packs/day: 2.0 packs/day for 50.0 years (100.0 ttl pk-yrs)    Types: Pipe, Cigarettes    Start date: 10/19/1964    Quit date: 10/20/2014    Years since quitting: 9.0    Passive exposure: Past   Smokeless tobacco: Never   Tobacco comments:    pipe   Vaping Use   Vaping status: Never Used  Substance and Sexual Activity   Alcohol use: No    Alcohol/week: 0.0 standard drinks of alcohol   Drug use: No   Sexual activity: Not on file  Other Topics Concern   Not on file  Social History Narrative   Has living will   Two sons and granddaughter are health care POAs (GD is first ---is DPT)   Would accept resuscitation and tube feedings if appropriate   Social Drivers of Health   Financial Resource Strain: Medium Risk (09/11/2023)   Received from [facility]   Overall Financial Resource Strain (CARDIA)    Difficulty  of Paying Living Expenses: Somewhat hard  Food Insecurity: Patient Unable To Answer (10/06/2023)   Hunger Vital Sign    Worried About Running Out of Food in the Last Year: Patient unable to answer    Ran Out of Food in the Last Year: Patient unable to answer  Recent Concern: Food Insecurity - Food Insecurity Present (09/11/2023)   Received from [facility]   Hunger Vital Sign    Within the past 12 months, you worried that your food would run out before you got the money to buy more.: Sometimes true    Within the past 12 months, the food you bought just didn't last and you didn't have money to get more.: Never true  Transportation Needs: Patient Unable To Answer (10/06/2023)   PRAPARE - Transportation    Lack of Transportation (Medical): Patient unable to answer    Lack of Transportation (Non-Medical): Patient unable to answer  Physical Activity: Not on file  Stress: Not on file  Social Connections: Unknown (10/06/2023)   Social Connection and Isolation Panel    Frequency of Communication with Friends and Family: Patient unable to answer    Frequency of Social Gatherings with Friends and Family: Patient unable to answer    Attends Religious Services: Patient unable to answer    Active Member of Clubs or Organizations: Patient unable to answer    Attends Banker Meetings: Patient unable to answer    Marital Status: Not on file  Intimate Partner Violence: Patient Unable To Answer (10/06/2023)   Humiliation, Afraid, Rape, and Kick questionnaire    Fear of Current or Ex-Partner: Patient unable to answer    Emotionally Abused: Patient unable to answer    Physically Abused: Patient unable to answer    Sexually Abused: Patient unable to answer     Review of Systems: Review of Systems  Constitutional:  Positive for fever and malaise/fatigue.  Gastrointestinal:  Positive for nausea.  Neurological:  Positive for weakness.     Vital Signs: Blood pressure  129/74, pulse 91, temperature 98.8 F (37.1 C), temperature source Oral, resp. rate (!) 24, height 5' 9.02 (1.753 m), weight 99.8 kg, SpO2 92%.  Weight trends: Filed Weights   11/05/23 0500 11/06/23 0441 11/07/23 0428  Weight: 101.8 kg 99.7 kg 99.8 kg     Physical Exam: General: No acute distress  Head: Normocephalic, atraumatic. Moist oral mucosal membranes  Eyes: Anicteric  Neck: Supple  Lungs:  Clear to auscultation, normal effort  Heart: S1S2 no rubs  Abdomen:  Soft, nontender, bowel sounds present, PEG tube present  Extremities: Trace peripheral edema.  Neurologic: Awake, alert, following commands  Skin: No acute rash  Access: No hemodialysis access    Lab results: Basic Metabolic Panel: Recent Labs  Lab 11/07/23 0446 11/07/23 1614 11/08/23 0529  NA 145 144 145  K 4.5 4.4 4.6  CL 111 110 110  CO2 25 25 26   GLUCOSE 158* 135* 140*  BUN 61* 61* 66*  CREATININE 2.56* 2.54* 2.69*  CALCIUM 7.8* 7.6* 7.7*    Liver Function Tests: Recent Labs  Lab 11/04/23 0445  AST 44*  ALT 72*  ALKPHOS 80  BILITOT 0.4  PROT 6.5  ALBUMIN 2.0*   No results for input(s): LIPASE, AMYLASE in the last 168 hours. No results for input(s): AMMONIA in the last 168 hours.  CBC: Recent Labs  Lab 11/04/23 1251 11/05/23 0407 11/06/23 0500 11/07/23 0446 11/08/23 0529  WBC 13.2* 12.6* 10.7* 12.8* 13.4*  HGB 7.8* 7.5* 8.8* 8.4* 8.5*  HCT 23.9* 23.0* 26.7* 26.5* 26.5*  MCV 97.2 96.2 92.1 94.6 95.7  PLT 309 307 319 360 340    Cardiac Enzymes: No results for input(s): CKTOTAL, CKMB, CKMBINDEX, TROPONINI in the last 168 hours.  BNP: Invalid input(s): POCBNP  CBG: Recent Labs  Lab 11/07/23 2356 11/08/23 0442 11/08/23 0810 11/08/23 1156 11/08/23 1603  GLUCAP 117* 131* 128* 122* 111*    Microbiology: Results for orders placed or performed during the hospital encounter of 10/05/23  Blood Culture (routine x 2)     Status: None   Collection Time: 10/05/23   5:06 AM   Specimen: BLOOD  Result Value Ref Range Status   Specimen Description BLOOD LA  Final   Special Requests   Final    BOTTLES DRAWN AEROBIC AND ANAEROBIC Blood Culture results may not be optimal due to an inadequate volume of blood received in culture bottles   Culture   Final    NO GROWTH 5 DAYS Performed at [facility], [address] [place], [place] [address]    Report Status 10/10/2023 FINAL  Final  Blood Culture (routine x 2)     Status: None   Collection Time: 10/05/23  5:07 AM   Specimen: BLOOD  Result Value Ref Range Status   Specimen Description BLOOD RA  Final   Special Requests   Final    BOTTLES DRAWN AEROBIC AND ANAEROBIC Blood Culture results may not be optimal due to an inadequate volume of blood received in culture bottles   Culture   Final    NO GROWTH 5 DAYS Performed at [facility], [address] [place], [place] [address]    Report Status 10/10/2023 FINAL  Final  Resp panel by RT-PCR (RSV, Flu A&B, Covid) Anterior Nasal Swab     Status: None   Collection Time:  10/05/23  5:56 AM   Specimen: Anterior Nasal Swab  Result Value Ref Range Status   SARS Coronavirus 2 by RT PCR NEGATIVE NEGATIVE Final    Comment: (NOTE) SARS-CoV-2 target nucleic acids are NOT DETECTED.  The SARS-CoV-2 RNA is generally detectable in upper respiratory specimens during the acute phase of infection. The lowest concentration of SARS-CoV-2 viral copies this assay can detect is 138 copies/mL. A negative result does not preclude SARS-Cov-2 infection and should not be used as the sole basis for treatment or other patient management decisions. A negative result may occur with  improper specimen collection/handling, submission of specimen other than nasopharyngeal swab, presence of viral mutation(s) within the areas targeted by this assay, and inadequate number of viral copies(<138 copies/mL). A negative result must be combined with clinical observations,  patient history, and epidemiological information. The expected result is Negative.  Fact Sheet for Patients:  BloggerCourse.com  Fact Sheet for Healthcare Providers:  SeriousBroker.it  This test is no t yet approved or cleared by the [place]  FDA and  has been authorized for detection and/or diagnosis of SARS-CoV-2 by FDA under an Emergency Use Authorization (EUA). This EUA will remain  in effect (meaning this test can be used) for the duration of the COVID-19 declaration under Section 564(b)(1) of the Act, 21 U.S.C.section 360bbb-3(b)(1), unless the authorization is terminated  or revoked sooner.       Influenza A by PCR NEGATIVE NEGATIVE Final   Influenza B by PCR NEGATIVE NEGATIVE Final    Comment: (NOTE) The Xpert Xpress SARS-CoV-2/FLU/RSV plus assay is intended as an aid in the diagnosis of influenza from Nasopharyngeal swab specimens and should not be used as a sole basis for treatment. Nasal washings and aspirates are unacceptable for Xpert Xpress SARS-CoV-2/FLU/RSV testing.  Fact Sheet for Patients: BloggerCourse.com  Fact Sheet for Healthcare Providers: SeriousBroker.it  This test is not yet approved or cleared by the [place]  FDA and has been authorized for detection and/or diagnosis of SARS-CoV-2 by FDA under an Emergency Use Authorization (EUA). This EUA will remain in effect (meaning this test can be used) for the duration of the COVID-19 declaration under Section 564(b)(1) of the Act, 21 U.S.C. section 360bbb-3(b)(1), unless the authorization is terminated or revoked.     Resp Syncytial Virus by PCR NEGATIVE NEGATIVE Final    Comment: (NOTE) Fact Sheet for Patients: BloggerCourse.com  Fact Sheet for Healthcare Providers: SeriousBroker.it  This test is not yet approved or cleared by the [place] FDA and has been authorized for detection and/or diagnosis of SARS-CoV-2 by FDA under an Emergency Use Authorization (EUA). This EUA will remain in effect (meaning this test can be used) for the duration of the COVID-19 declaration under Section 564(b)(1) of the Act, 21 U.S.C. section 360bbb-3(b)(1), unless the authorization is terminated or revoked.  Performed at [facility], [address] [place], [place] [address]   Respiratory (~20 pathogens) panel by PCR     Status: None   Collection Time: 10/05/23  8:11 AM   Specimen: Nasopharyngeal Swab; Respiratory  Result Value Ref Range Status   Adenovirus NOT DETECTED NOT DETECTED Final   Coronavirus 229E NOT DETECTED NOT DETECTED Final    Comment: (NOTE) The Coronavirus on the Respiratory Panel, DOES NOT test for the novel  Coronavirus (2019 nCoV)    Coronavirus HKU1 NOT DETECTED NOT DETECTED Final   Coronavirus NL63 NOT DETECTED NOT DETECTED Final   Coronavirus OC43 NOT DETECTED NOT DETECTED Final   Metapneumovirus  NOT DETECTED NOT DETECTED Final   Rhinovirus / Enterovirus NOT DETECTED NOT DETECTED Final   Influenza A NOT DETECTED NOT DETECTED Final   Influenza B NOT DETECTED NOT DETECTED Final   Parainfluenza Virus 1 NOT DETECTED NOT DETECTED Final   Parainfluenza Virus 2 NOT DETECTED NOT DETECTED Final   Parainfluenza Virus 3 NOT DETECTED NOT DETECTED Final   Parainfluenza Virus 4 NOT DETECTED NOT DETECTED Final   Respiratory Syncytial Virus NOT DETECTED NOT DETECTED Final   Bordetella pertussis NOT DETECTED NOT DETECTED Final   Bordetella Parapertussis NOT DETECTED NOT DETECTED Final   Chlamydophila pneumoniae NOT DETECTED NOT DETECTED Final   Mycoplasma pneumoniae NOT DETECTED NOT DETECTED Final    Comment: Performed at [facility], [address], [place], [place] [address]  Expectorated Sputum Assessment w Gram Stain, Rflx to Resp Cult     Status: None   Collection Time: 10/05/23  9:07 AM    Specimen: Sputum  Result Value Ref Range Status   Specimen Description SPUTUM  Final   Special Requests NONE  Final   Sputum evaluation   Final    Sputum specimen not acceptable for testing.  Please recollect.   C/KERRY NELSON AT 1005 10/05/23.PMF Performed at [facility], [address] [place], [place] [address]    Report Status 10/05/2023 FINAL  Final  Expectorated Sputum Assessment w Gram Stain, Rflx to Resp Cult     Status: None   Collection Time: 10/05/23 10:50 AM  Result Value Ref Range Status   Specimen Description EXPECTORATED SPUTUM  Final   Special Requests NONE  Final   Sputum evaluation   Final    THIS SPECIMEN IS ACCEPTABLE FOR SPUTUM CULTURE Performed at [facility], [address], [place], [place] [address]    Report Status 10/05/2023 FINAL  Final  Culture, Respiratory w Gram Stain     Status: None   Collection Time: 10/05/23 10:50 AM  Result Value Ref Range Status   Specimen Description   Final    EXPECTORATED SPUTUM Performed at [facility], [address] [place], [place] [address]    Special Requests   Final    NONE Reflexed from [id number] Performed at [facility], [address] [place], [place] [address]    Gram Stain   Final    RARE WBC SEEN RARE Hillis Lu POSITIVE RODS RARE Hillis Lu POSITIVE COCCI RARE GRAM NEGATIVE RODS    Culture   Final    FEW Normal respiratory flora-no Staph aureus or Pseudomonas seen Performed at [facility], [address], [place], [place] [address]    Report Status 10/07/2023 FINAL  Final  MRSA Next Gen by PCR, Nasal     Status: None   Collection Time: 10/06/23 12:57 AM   Specimen: Nasal Mucosa; Nasal Swab  Result Value Ref Range Status   MRSA by PCR Next Gen NOT DETECTED NOT DETECTED Final    Comment: (NOTE) The GeneXpert MRSA Assay (FDA approved for NASAL specimens only), is one component of a comprehensive MRSA colonization surveillance program. It is not intended to  diagnose MRSA infection nor to guide or monitor treatment for MRSA infections. Test performance is not FDA approved in patients less than 70 years old. Performed at [facility], [address] [place], [place] [address]   Culture, Respiratory w Gram Stain     Status: None   Collection Time: 10/06/23 11:37 AM   Specimen: INDUCED SPUTUM  Result Value  Ref Range Status   Specimen Description   Final    INDUCED SPUTUM Performed at [facility], [address] [place], [place] [address]    Special Requests   Final    NONE Performed at [facility], [address] [place], [place] [address]    Gram Stain   Final    FEW WBC PRESENT,BOTH PMN AND MONONUCLEAR FEW GRAM POSITIVE RODS    Culture   Final    MODERATE LACTOBACILLUS FERMENTUM Standardized susceptibility testing for this organism is not available. Performed at [facility], [address], [place], [place] [address]    Report Status 10/09/2023 FINAL  Final  Culture, blood (x 2)     Status: None   Collection Time: 10/22/23  1:00 AM   Specimen: BLOOD  Result Value Ref Range Status   Specimen Description BLOOD BLOOD RIGHT ARM  Final   Special Requests   Final    BOTTLES DRAWN AEROBIC AND ANAEROBIC Blood Culture adequate volume   Culture   Final    NO GROWTH 5 DAYS Performed at [facility], [address] [place], [place] [address]    Report Status 10/27/2023 FINAL  Final  Culture, blood (x 2)     Status: None   Collection Time: 10/22/23  1:00 AM   Specimen: BLOOD  Result Value Ref Range Status   Specimen Description BLOOD BLOOD RIGHT ARM  Final   Special Requests   Final    BOTTLES DRAWN AEROBIC AND ANAEROBIC Blood Culture adequate volume   Culture   Final    NO GROWTH 5 DAYS Performed at [facility], [address], [place], [place] [address]    Report Status 10/27/2023 FINAL  Final    Coagulation Studies: No results for input(s): LABPROT, INR in the  last 72 hours.  Urinalysis: No results for input(s): COLORURINE, LABSPEC, PHURINE, GLUCOSEU, HGBUR, BILIRUBINUR, KETONESUR, PROTEINUR, UROBILINOGEN, NITRITE, LEUKOCYTESUR in the last 72 hours.  Invalid input(s): APPERANCEUR    Imaging: DG Chest Port 1 View Result Date: 11/08/2023 CLINICAL DATA:  Congestive heart failure EXAM: PORTABLE CHEST 1 VIEW COMPARISON:  Chest radiograph dated 10/29/2023 FINDINGS: Patient is rotated to the right. Low lung volumes with bronchovascular crowding. Increased diffuse interstitial opacities. Similar trace bilateral pleural effusions. No pneumothorax. Similar enlarged cardiomediastinal silhouette. No acute osseous abnormality. IMPRESSION: 1. Increased diffuse interstitial opacities, likely pulmonary edema. 2. Similar trace bilateral pleural effusions. Electronically Signed   By: Limin  Xu M.D.   On: 11/08/2023 13:18     Assessment & Plan: Pt is a 83 y.o. male with a PMHx of obstructive sleep apnea, GERD, obesity, hypertension, dysphagia, severe esophageal dysmotility with achalasia status post PEG tube placement, recent aspiration pneumonia acute respiratory failure status post extubation 10/09/2023, who was admitted to [facility] on 10/05/2023 for evaluation of evaluation of sepsis and pneumonia secondary to aspiration pneumonia.   1.  Acute kidney injury/chronic kidney disease stage IIIb.  Suspect acute kidney injury is likely related to multiple bouts of volume depletion.  During the course of this hospitalization he has pulled the PEG tube out.  He also appears to have mild bilateral hydronephrosis noted on renal ultrasound however this was not apparent on recent CT scan.  Start the patient on gentle hydration but keep low threshold to stop if he develops worsening shortness of breath.  2.  Anemia of chronic kidney disease.  Hemoglobin currently 8.5.  May need to consider Epogen over the course of the  hospitalization.  3.  Further plan as  patient progresses.

## 2023-11-08 NOTE — Progress Notes (Signed)
   11/08/23 1027  Spiritual Encounters  Type of Visit Follow up  Care provided to: Pt and family (Significant Other and Son at bedside)  Sissy Duff partners present during encounter Nurse  Referral source Family  Reason for visit Routine spiritual support  OnCall Visit No  Spiritual Framework  Presenting Themes Goals in life/care;Other (comment) (Family is excited that the Pt gets to move to a PT rehab; they feel his over all health will improve w/movement)  Family Stress Factors Other (Comment) (Son has to travel)  Interventions  Spiritual Care Interventions Made Established relationship of care and support;Compassionate presence;Reflective listening;Other (comment) (for Family and Pt; family was glad to see me after I was w/them in ICU)  Intervention Outcomes  Outcomes Connection to spiritual care;Awareness around self/spiritual resourses;Connection to values and goals of care;Other (comment) (for Family)

## 2023-11-08 NOTE — TOC Progression Note (Signed)
 Transition of Care [facility]) - Progression Note    Patient Details  Name: Robert Vega MRN: [id number] Date of Birth: 1941/03/24  Transition of [facility] CM/SW Contact  Loman Risk, RN Phone Number: 11/08/2023, 2:39 PM  Clinical Narrative:     Per MD patient not medically ready for dc today IVF restarted Per [name] with [facility] if patient is ready tomorrow he can still admit under the 3 day grace period.  If patient does not admit tomorrow will require new auth.  [facility] would need dc summary by 2pm. Granddaughter [name] updated        Expected Discharge Plan and Services                                               Social Determinants of Health (SDOH) Interventions SDOH Screenings   Food Insecurity: Patient Unable To Answer (10/06/2023)  Recent Concern: Food Insecurity - Food Insecurity Present (09/11/2023)   Received from [facility]  Housing: Patient Unable To Answer (10/06/2023)  Recent Concern: Housing - High Risk (09/11/2023)   Received from [facility]  Transportation Needs: Patient Unable To Answer (10/06/2023)  Utilities: Patient Unable To Answer (10/06/2023)  Depression (PHQ2-9): Low Risk  (05/07/2023)  Financial Resource Strain: Medium Risk (09/11/2023)   Received from [facility]  Social Connections: Unknown (10/06/2023)  Tobacco Use: Medium Risk (11/05/2023)    Readmission Risk Interventions     No data to display

## 2023-11-08 NOTE — Progress Notes (Signed)
 PROGRESS NOTE    Robert Vega   BJY:[id number] DOB: 1940/09/17  DOA: 10/05/2023 Date of Service: 11/08/23 which is hospital day 34  PCP: Helaine Llanos, MD    Hospital course / significant events:   HPI: 83 y.o male with significant PMH of OSA, GERD, Obesity, HTN, Dysphagia - presented to the ED 10/05/2023 from with hypoxia, fever and generalized weakness. Dx sepsis/pneumonia, concern for aspiration   Of note, EGD 03/2022 with food in upper esophagus complicated by aspiration event, cardiac arrest with round of CPR, and post resuscitation EGD with concern for lack of peristalsis. Hx rior EGD 08/2020 with note of abnormal cricopharyngeus, decrease in motility in esophagus, and spastic LES who   5/10: Admit to [facility] service with sepsis due to Aspiration Pneumonia.  Course complicated by Acute Respiratory Failure due to Aspiration of vomitus requiring s/p cardiac arrest  transfer to ICU and intubation.  5/11 flexible bronchoscopy done by critical care team 5/12 remains intubated 5/13 remains on vent, s/p PEG tube, +vomiting last night, failed SAT/SBT 5/14 extubated successfully but with severe delirium 5/20.  Botulinum toxin injection into the lower esophageal sphincter by Dr. Ole Berkeley 5/21 esophagram showing diffusely distended esophagus with distal obstruction and severe dysmotility suggestive of achalasia. 5/22.  Advised I would not give him any food and will continue PEG feeding he can try liquids to see if that goes down but still high risk for aspiration.   5/22: fall  5/23.   Awaiting insurance authorization for rehab. 5/24.  Tolerating some clear luquids\ /25.  Patient pulled out PEG tube this morning.  Foley catheter placed in the stoma.  GI able to place a another PEG tube and remove the Foley. 5/26.  Creatinine up at 1.83.  Continue IV fluid hydration.  Patient started having pain after tube feeding started.  CT scan showed that the PEG tube was not in the correct place.   Notified GI.  Started empiric antibiotics. 5/27.  Patient was taken urgently to the operating room by Dr. Cornel Diesel because the patient had fever and elevated white count with suspected intra-abdominal sepsis. 5/28: restart tube feeds  05/30: advancing tube feeds, remains on Zosyn   06/01: on and off AKI 06/04: family working on decision for SNF  06/07: repeat CT abd d/t higher WBC, holding tube feeds but CT nothing acute  06/09: remains stable, pending SNF auth. New melena  06/10: GI repeat EGD, no bleeding  06/11: GI nothing else to add and HH stable GI has s/o, Cr up a bit, increasing free water , granddaughter requests reeval from SLP will ask them to see him tm  06/12: nephrology consult - restart IV fluids and monitor        ASSESSMENT & PLAN:   AKI (acute kidney injury) (HCC) with possible progression to ckd 3b baseline creatinine is 1.6 with an EGFR of 40  uop adequate, not on nephrotoxic agents. May have progressed to ckd 3b Per nephrology - suspect acute kidney injury is likely related to multiple bouts of volume depletion. D mild bilateral hydronephrosis noted on renal ultrasound however this was not apparent on recent CT scan.  Increased free water  admin via PEG --> no significant improvement in Cr --> nephrology consult  Dietician is adjusting tube feed  Per nephrology, start on gentle hydration but keep low threshold to stop if he develops worsening shortness of breath.  Monitor BMP  Melena Anemia chronic disease  Repeat EGD 06/10 no concerns, GI has s/o HH has been  stable Follow CBC  Per nephrology consider Epogen    Severe sepsis - resolved  Initially w/ aspiration pna Later in hospital stay w/ intra-abdominal infection secondary to PEG tube not being in the right place Completed abx  Achalasia Dysphagia Aspiration risk is high  Status post botulinum toxin injection on 5/20.   Continue tube feeds N.p.o. for the foreseeable future per GI - discussed  risk/benefit w/ granddaughter. Advised granddaughter that if she decides for po, she must accept risk for aspiration  SLP to follow  Will need outpatient referral to tertiary advanced GI for consideration of POEMS procedure.  Recommend this only after a few weeks recovery period.    Delirium Agitation/sundowning Granddaughter has asked to discontinue prn antipsychotics  GERD (gastroesophageal reflux disease) Continue PPI   Acute respiratory failure with hypoxia and hypercapnia (HCC) Pneumonia HFpEF Now resolved Monitoring periodic CXR  Caution w/ IV fluids    Essential hypertension, benign Bp wnl off meds Monitor VS     Generalized weakness Therapy as tolerated SNF placement once nephrology clears / pending other clinical developments      Class 1 obesity based on BMI: Body mass index is 32.44 kg/m.Aaron Aas Significantly low or high BMI is associated with higher medical risk.  Underweight - under 18  overweight - 25 to 29 obese - 30 or more Class 1 obesity: BMI of 30.0 to 34 Class 2 obesity: BMI of 35.0 to 39 Class 3 obesity: BMI of 40.0 to 49 Super Morbid Obesity: BMI 50-59 Super-super Morbid Obesity: BMI 60+ Healthy nutrition and physical activity advised as adjunct to other disease management and risk reduction treatments    DVT prophylaxis: none w/ anemia IV fluids: no continuous IV fluids  Nutrition: tube feeds, granddaughter requests SLP eval to reconsider for CLD see above Central lines / other devices: PEG  Code Status: FULL CODE ACP documentation reviewed:  none on file in [facility]  TOC needs: placement Medical barriers to dispo: AKI. Expected medical readiness for discharge 1-2 days.               Subjective / Brief ROS:  Patient reports no concerns Denies CP/SOB.  Pain controlled.    Family Communication: spoke on phone w/  granddaughter 11/08/23 4:46 PM     Objective Findings:  Vitals:   11/08/23 0300 11/08/23 0813 11/08/23 1304  11/08/23 1551  BP: 124/63 116/67  129/74  Pulse: 94 92  91  Resp: 18 (!) 25  (!) 24  Temp: 99.5 F (37.5 C) 98.7 F (37.1 C) 99 F (37.2 C) 98.8 F (37.1 C)  TempSrc:  Oral Oral Oral  SpO2: 92% 91%  92%  Weight:      Height:        Intake/Output Summary (Last 24 hours) at 11/08/2023 1645 Last data filed at 11/08/2023 1413 Gross per 24 hour  Intake 1920 ml  Output 2700 ml  Net -780 ml   Filed Weights   11/05/23 0500 11/06/23 0441 11/07/23 0428  Weight: 101.8 kg 99.7 kg 99.8 kg    Examination:  Physical Exam Constitutional:      General: He is not in acute distress.    Appearance: He is not ill-appearing.   Cardiovascular:     Rate and Rhythm: Normal rate and regular rhythm.  Pulmonary:     Effort: Pulmonary effort is normal.     Breath sounds: Normal breath sounds.  Abdominal:     Palpations: Abdomen is soft. There is no mass.  Tenderness: There is no abdominal tenderness.   Neurological:     Mental Status: He is alert. Mental status is at baseline. He is disoriented.   Psychiatric:        Mood and Affect: Mood normal.          Scheduled Medications:   free water   200 mL Per Tube Q2H   gentamicin  ointment   Topical TID   insulin  aspart  0-9 Units Subcutaneous Q4H   mouth rinse  15 mL Mouth Rinse 4 times per day   pantoprazole  (PROTONIX ) IV  40 mg Intravenous Q12H    Continuous Infusions:  sodium chloride  50 mL/hr at 11/08/23 1213   feeding supplement (OSMOLITE 1.5 CAL) 1,000 mL (11/08/23 1202)    PRN Medications:  acetaminophen , acetaminophen , bisacodyl , haloperidol , hydrALAZINE , ipratropium-albuterol , [DISCONTINUED] ondansetron  **OR** ondansetron  (ZOFRAN ) IV, mouth rinse, mouth rinse  Antimicrobials from admission:  Anti-infectives (From admission, onward)    Start     Dose/Rate Route Frequency Ordered Stop   10/21/23 2200  Ampicillin -Sulbactam (UNASYN ) 3 g in sodium chloride  0.9 % 100 mL IVPB  Status:  Discontinued        3 g 200 mL/hr  over 30 Minutes Intravenous Every 6 hours 10/21/23 2011 10/21/23 2014   10/21/23 2200  piperacillin -tazobactam (ZOSYN ) IVPB 3.375 g        3.375 g 12.5 mL/hr over 240 Minutes Intravenous Every 8 hours 10/21/23 2019 10/26/23 2359   10/06/23 1600  cefTRIAXone  (ROCEPHIN ) 2 g in sodium chloride  0.9 % 100 mL IVPB        2 g 200 mL/hr over 30 Minutes Intravenous Every 24 hours 10/06/23 1415 10/09/23 1749   10/06/23 0600  piperacillin -tazobactam (ZOSYN ) IVPB 3.375 g  Status:  Discontinued        3.375 g 12.5 mL/hr over 240 Minutes Intravenous Every 8 hours 10/06/23 0356 10/06/23 1415   10/06/23 0500  cefTRIAXone  (ROCEPHIN ) 2 g in sodium chloride  0.9 % 100 mL IVPB  Status:  Discontinued        2 g 200 mL/hr over 30 Minutes Intravenous Every 24 hours 10/05/23 0750 10/06/23 0341   10/06/23 0500  azithromycin  (ZITHROMAX ) 500 mg in sodium chloride  0.9 % 250 mL IVPB  Status:  Discontinued        500 mg 250 mL/hr over 60 Minutes Intravenous Every 24 hours 10/05/23 0750 10/06/23 1409   10/05/23 0615  metroNIDAZOLE  (FLAGYL ) IVPB 500 mg        500 mg 100 mL/hr over 60 Minutes Intravenous  Once 10/05/23 0611 10/05/23 0910   10/05/23 0545  cefTRIAXone  (ROCEPHIN ) 2 g in sodium chloride  0.9 % 100 mL IVPB        2 g 200 mL/hr over 30 Minutes Intravenous Once 10/05/23 0533 10/05/23 0650   10/05/23 0545  azithromycin  (ZITHROMAX ) 500 mg in sodium chloride  0.9 % 250 mL IVPB        500 mg 250 mL/hr over 60 Minutes Intravenous  Once 10/05/23 0533 10/05/23 0805           Data Reviewed:  I have personally reviewed the following...  CBC: Recent Labs  Lab 11/04/23 1251 11/05/23 0407 11/06/23 0500 11/07/23 0446 11/08/23 0529  WBC 13.2* 12.6* 10.7* 12.8* 13.4*  HGB 7.8* 7.5* 8.8* 8.4* 8.5*  HCT 23.9* 23.0* 26.7* 26.5* 26.5*  MCV 97.2 96.2 92.1 94.6 95.7  PLT 309 307 319 360 340   Basic Metabolic Panel: Recent Labs  Lab 11/04/23 0445 11/06/23 0500 11/07/23 0446 11/07/23 1614  11/08/23 0529  NA  139 143 145 144 145  K 4.0 4.3 4.5 4.4 4.6  CL 106 110 111 110 110  CO2 24 24 25 25 26   GLUCOSE 140* 106* 158* 135* 140*  BUN 55* 53* 61* 61* 66*  CREATININE 1.96* 2.33* 2.56* 2.54* 2.69*  CALCIUM 7.5* 7.9* 7.8* 7.6* 7.7*   GFR: Estimated Creatinine Clearance: 23.8 mL/min (A) (by C-G formula based on SCr of 2.69 mg/dL (H)). Liver Function Tests: Recent Labs  Lab 11/04/23 0445  AST 44*  ALT 72*  ALKPHOS 80  BILITOT 0.4  PROT 6.5  ALBUMIN 2.0*   No results for input(s): LIPASE, AMYLASE in the last 168 hours. No results for input(s): AMMONIA in the last 168 hours. Coagulation Profile: No results for input(s): INR, PROTIME in the last 168 hours. Cardiac Enzymes: No results for input(s): CKTOTAL, CKMB, CKMBINDEX, TROPONINI in the last 168 hours. BNP (last 3 results) No results for input(s): PROBNP in the last 8760 hours. HbA1C: No results for input(s): HGBA1C in the last 72 hours. CBG: Recent Labs  Lab 11/07/23 2356 11/08/23 0442 11/08/23 0810 11/08/23 1156 11/08/23 1603  GLUCAP 117* 131* 128* 122* 111*   Lipid Profile: No results for input(s): CHOL, HDL, LDLCALC, TRIG, CHOLHDL, LDLDIRECT in the last 72 hours. Thyroid  Function Tests: No results for input(s): TSH, T4TOTAL, FREET4, T3FREE, THYROIDAB in the last 72 hours. Anemia Panel: Recent Labs    11/07/23 0445  FERRITIN 228  TIBC 258  IRON 21*   Most Recent Urinalysis On File:     Component Value Date/Time   COLORURINE YELLOW (A) 10/05/2023 0506   APPEARANCEUR HAZY (A) 10/05/2023 0506   LABSPEC 1.028 10/05/2023 0506   PHURINE 5.0 10/05/2023 0506   GLUCOSEU NEGATIVE 10/05/2023 0506   HGBUR SMALL (A) 10/05/2023 0506   BILIRUBINUR NEGATIVE 10/05/2023 0506   KETONESUR 20 (A) 10/05/2023 0506   PROTEINUR 30 (A) 10/05/2023 0506   NITRITE NEGATIVE 10/05/2023 0506   LEUKOCYTESUR NEGATIVE 10/05/2023 0506   Sepsis  Labs: @LABRCNTIP (procalcitonin:4,lacticidven:4) Microbiology: No results found for this or any previous visit (from the past 240 hours).    Radiology Studies last 3 days: DG Chest Port 1 View Result Date: 11/08/2023 CLINICAL DATA:  Congestive heart failure EXAM: PORTABLE CHEST 1 VIEW COMPARISON:  Chest radiograph dated 10/29/2023 FINDINGS: Patient is rotated to the right. Low lung volumes with bronchovascular crowding. Increased diffuse interstitial opacities. Similar trace bilateral pleural effusions. No pneumothorax. Similar enlarged cardiomediastinal silhouette. No acute osseous abnormality. IMPRESSION: 1. Increased diffuse interstitial opacities, likely pulmonary edema. 2. Similar trace bilateral pleural effusions. Electronically Signed   By: Limin  Xu M.D.   On: 11/08/2023 13:18        Derik Fults, DO [facility] 11/08/2023, 4:45 PM    Dictation software may have been used to generate the above note. Typos may occur and escape review in typed/dictated notes. Please contact Dr Authur Leghorn directly for clarity if needed.  Staff may message me via secure chat in Epic  but this may not receive an immediate response,  please page me for urgent matters!  If 7PM-7AM, please contact night coverage [email or web address]

## 2023-11-08 NOTE — Progress Notes (Addendum)
 Nutrition Follow Up Note   DOCUMENTATION CODES:   Non-severe (moderate) malnutrition in context of acute illness/injury  INTERVENTION:   Increase Osmolite 1.5 to 50ml/hr continuous   Discontinue ProSource TF 20  Free water  flushes 200ml q2 hours per MD   Regimen provides 2340kcal/day, 98g/day protein and 3574ml/day of free water .   Daily weights   NUTRITION DIAGNOSIS:   Moderate Malnutrition related to acute illness as evidenced by mild fat depletion, mild muscle depletion, moderate muscle depletion, percent weight loss. -ongoing   GOAL:   Patient will meet greater than or equal to 90% of their needs -met   MONITOR:   PO intake, Labs, Weight trends, TF tolerance, I & O's, Skin  ASSESSMENT:   83 y/o male with h/o GERD, esophageal dysphagia, HTN, BPH, mood disorder and hiatal hernia who is admitted with aspiration event, PNA, sepsis, cardiac arrest and dysphagia.  Pt continues to tolerate tube feeds well at goal rate. Refeed labs have not been checked since 6/1; will recheck tomorrow. Pt with worsening renal function; will adjust tube feeds to provide less protein. Nephrology consulted. Pt initiated on a nectar clear liquid diet today for pleasure. Per chart, pt appears fairly weight stable over the past few weeks. Pt is noted to have pulmonary edema on CXR today. Plan is for SNF at discharge.   Medications reviewed and include: insulin , protonix , NaCl @50ml /hr   Labs reviewed: K 4.6 wnl, BUN 66(H), P 2.69 wnl Wbc- 13.4(H), Hgb 8.5(L), Hct 26.5(L) Cbgs- 122, 128, 131 x 24 hrs   UOP-   Diet Order:   Diet Order             Diet clear liquid Room service appropriate? No; Fluid consistency: Nectar Thick  Diet effective now                  EDUCATION NEEDS:   No education needs have been identified at this time  Skin:  Skin Assessment: Skin Integrity Issues: Skin Integrity Issues:: Incisions Incisions: abdomen Other: skin tear to lt lower arm  Last  BM:  6/12- per RN  Height:   Ht Readings from Last 1 Encounters:  10/08/23 5' 9.02 (1.753 m)    Weight:   Wt Readings from Last 1 Encounters:  11/07/23 99.8 kg    Ideal Body Weight:  72.7 kg  BMI:  Body mass index is 32.48 kg/m.  Estimated Nutritional Needs:   Kcal:  2000-2300kcal/day  Protein:  100-115g/day  Fluid:  1.9-2.2L/day  Robert Freestone MS, RD, LDN If unable to be reached, please send secure chat to RD inpatient available from 8:00a-4:00p daily

## 2023-11-08 NOTE — Plan of Care (Signed)
  Problem: Education: Goal: Knowledge of General Education information will improve Description: Including pain rating scale, medication(s)/side effects and non-pharmacologic comfort measures Outcome: Progressing   Problem: Health Behavior/Discharge Planning: Goal: Ability to manage health-related needs will improve Outcome: Progressing   Problem: Clinical Measurements: Goal: Ability to maintain clinical measurements within normal limits will improve Outcome: Progressing Goal: Will remain free from infection Outcome: Progressing Goal: Diagnostic test results will improve Outcome: Progressing Goal: Respiratory complications will improve Outcome: Progressing Goal: Cardiovascular complication will be avoided Outcome: Progressing   Problem: Activity: Goal: Risk for activity intolerance will decrease Outcome: Progressing   Problem: Nutrition: Goal: Adequate nutrition will be maintained Outcome: Progressing   Problem: Coping: Goal: Level of anxiety will decrease Outcome: Progressing   Problem: Elimination: Goal: Will not experience complications related to bowel motility Outcome: Progressing Goal: Will not experience complications related to urinary retention Outcome: Progressing   Problem: Pain Managment: Goal: General experience of comfort will improve and/or be controlled Outcome: Progressing   Problem: Safety: Goal: Ability to remain free from injury will improve Outcome: Progressing   Problem: Skin Integrity: Goal: Risk for impaired skin integrity will decrease Outcome: Progressing   Problem: Activity: Goal: Ability to tolerate increased activity will improve Outcome: Progressing   Problem: Clinical Measurements: Goal: Ability to maintain a body temperature in the normal range will improve Outcome: Progressing   Problem: Respiratory: Goal: Ability to maintain adequate ventilation will improve Outcome: Progressing Goal: Ability to maintain a clear airway  will improve Outcome: Progressing   Problem: Education: Goal: Ability to describe self-care measures that may prevent or decrease complications (Diabetes Survival Skills Education) will improve Outcome: Progressing Goal: Individualized Educational Video(s) Outcome: Progressing   Problem: Coping: Goal: Ability to adjust to condition or change in health will improve Outcome: Progressing   Problem: Fluid Volume: Goal: Ability to maintain a balanced intake and output will improve Outcome: Progressing   Problem: Health Behavior/Discharge Planning: Goal: Ability to identify and utilize available resources and services will improve Outcome: Progressing Goal: Ability to manage health-related needs will improve Outcome: Progressing   Problem: Metabolic: Goal: Ability to maintain appropriate glucose levels will improve Outcome: Progressing   Problem: Nutritional: Goal: Maintenance of adequate nutrition will improve Outcome: Progressing Goal: Progress toward achieving an optimal weight will improve Outcome: Progressing   Problem: Skin Integrity: Goal: Risk for impaired skin integrity will decrease Outcome: Progressing   Problem: Tissue Perfusion: Goal: Adequacy of tissue perfusion will improve Outcome: Progressing   Problem: Activity: Goal: Ability to tolerate increased activity will improve Outcome: Progressing   Problem: Respiratory: Goal: Ability to maintain a clear airway and adequate ventilation will improve Outcome: Progressing   Problem: Role Relationship: Goal: Method of communication will improve Outcome: Progressing   Problem: Fluid Volume: Goal: Hemodynamic stability will improve Outcome: Progressing   Problem: Clinical Measurements: Goal: Diagnostic test results will improve Outcome: Progressing Goal: Signs and symptoms of infection will decrease Outcome: Progressing   Problem: Respiratory: Goal: Ability to maintain adequate ventilation will  improve Outcome: Progressing

## 2023-11-09 ENCOUNTER — Inpatient Hospital Stay

## 2023-11-09 DIAGNOSIS — R6520 Severe sepsis without septic shock: Secondary | ICD-10-CM | POA: Diagnosis not present

## 2023-11-09 DIAGNOSIS — A419 Sepsis, unspecified organism: Secondary | ICD-10-CM | POA: Diagnosis not present

## 2023-11-09 LAB — BASIC METABOLIC PANEL WITH GFR
Anion gap: 10 (ref 5–15)
BUN: 60 mg/dL — ABNORMAL HIGH (ref 8–23)
CO2: 25 mmol/L (ref 22–32)
Calcium: 7.7 mg/dL — ABNORMAL LOW (ref 8.9–10.3)
Chloride: 110 mmol/L (ref 98–111)
Creatinine, Ser: 2.73 mg/dL — ABNORMAL HIGH (ref 0.61–1.24)
GFR, Estimated: 22 mL/min — ABNORMAL LOW (ref >60)
Glucose, Bld: 118 mg/dL — ABNORMAL HIGH (ref 70–99)
Potassium: 4.6 mmol/L (ref 3.5–5.1)
Sodium: 145 mmol/L (ref 135–145)

## 2023-11-09 LAB — MAGNESIUM: Magnesium: 2.5 mg/dL — ABNORMAL HIGH (ref 1.7–2.4)

## 2023-11-09 LAB — CBC
HCT: 25.8 % — ABNORMAL LOW (ref 39.0–52.0)
Hemoglobin: 8.3 g/dL — ABNORMAL LOW (ref 13.0–17.0)
MCH: 30.6 pg (ref 26.0–34.0)
MCHC: 32.2 g/dL (ref 30.0–36.0)
MCV: 95.2 fL (ref 80.0–100.0)
Platelets: 346 10*3/uL (ref 150–400)
RBC: 2.71 MIL/uL — ABNORMAL LOW (ref 4.22–5.81)
RDW: 14.6 % (ref 11.5–15.5)
WBC: 15.4 10*3/uL — ABNORMAL HIGH (ref 4.0–10.5)
nRBC: 0 % (ref 0.0–0.2)

## 2023-11-09 LAB — GLUCOSE, CAPILLARY
Glucose-Capillary: 115 mg/dL — ABNORMAL HIGH (ref 70–99)
Glucose-Capillary: 116 mg/dL — ABNORMAL HIGH (ref 70–99)
Glucose-Capillary: 131 mg/dL — ABNORMAL HIGH (ref 70–99)
Glucose-Capillary: 140 mg/dL — ABNORMAL HIGH (ref 70–99)
Glucose-Capillary: 141 mg/dL — ABNORMAL HIGH (ref 70–99)
Glucose-Capillary: 148 mg/dL — ABNORMAL HIGH (ref 70–99)

## 2023-11-09 LAB — RESP PANEL BY RT-PCR (RSV, FLU A&B, COVID)  RVPGX2
Influenza A by PCR: NEGATIVE
Influenza B by PCR: NEGATIVE
Resp Syncytial Virus by PCR: NEGATIVE
SARS Coronavirus 2 by RT PCR: NEGATIVE

## 2023-11-09 LAB — PHOSPHORUS: Phosphorus: 4.7 mg/dL — ABNORMAL HIGH (ref 2.5–4.6)

## 2023-11-09 NOTE — Progress Notes (Signed)
 PROGRESS NOTE    Robert Vega   ZOX:[id number] DOB: 05-22-1941  DOA: 10/05/2023 Date of Service: 11/09/23 which is hospital day 35  PCP: Helaine Llanos, MD    Hospital course / significant events:   HPI: 83 y.o male with significant PMH of OSA, GERD, Obesity, HTN, Dysphagia - presented to the ED 10/05/2023 from with hypoxia, fever and generalized weakness. Dx sepsis/pneumonia, concern for aspiration   Of note, EGD 03/2022 with food in upper esophagus complicated by aspiration event, cardiac arrest with round of CPR, and post resuscitation EGD with concern for lack of peristalsis. Hx rior EGD 08/2020 with note of abnormal cricopharyngeus, decrease in motility in esophagus, and spastic LES who   5/10: Admit to [facility] service with sepsis due to Aspiration Pneumonia.  Course complicated by Acute Respiratory Failure due to Aspiration of vomitus requiring s/p cardiac arrest  transfer to ICU and intubation.  5/11 flexible bronchoscopy done by critical care team 5/12 remains intubated 5/13 remains on vent, s/p PEG tube, +vomiting last night, failed SAT/SBT 5/14 extubated successfully but with severe delirium 5/20.  Botulinum toxin injection into the lower esophageal sphincter by Dr. Ole Berkeley 5/21 esophagram showing diffusely distended esophagus with distal obstruction and severe dysmotility suggestive of achalasia. 5/22.  Advised I would not give him any food and will continue PEG feeding he can try liquids to see if that goes down but still high risk for aspiration.   5/22: fall  5/23.   Awaiting insurance authorization for rehab. 5/24.  Tolerating some clear luquids\ /25.  Patient pulled out PEG tube this morning.  Foley catheter placed in the stoma.  GI able to place a another PEG tube and remove the Foley. 5/26.  Creatinine up at 1.83.  Continue IV fluid hydration.  Patient started having pain after tube feeding started.  CT scan showed that the PEG tube was not in the correct place.   Notified GI.  Started empiric antibiotics. 5/27.  Patient was taken urgently to the operating room by Dr. Cornel Diesel because the patient had fever and elevated white count with suspected intra-abdominal sepsis. 5/28: restart tube feeds  05/30: advancing tube feeds, remains on Zosyn   06/01: on and off AKI 06/04: family working on decision for SNF  06/07: repeat CT abd d/t higher WBC, holding tube feeds but CT nothing acute  06/09: remains stable, pending SNF auth. New melena  06/10: GI repeat EGD, no bleeding  06/11: GI nothing else to add and HH stable GI has s/o, Cr up a bit, increasing free water , granddaughter requests reeval from SLP will ask them to see him tm  06/12: nephrology consult - restart IV fluids and monitor  06/14: cr continues to worsen despite fluids via peg and iv, WBC increasing, not meeting sirs/sepsis but will workup for infection.        ASSESSMENT & PLAN:   Leukocytosis No SIRS/Sepsis however Wbc increasing today to 15 Resp PCR  Blood cultures UA CXR Consider CT C/A/P if above is negative Hold abx until source / cultures collected No diarrhea but monitor for this   AKI (acute kidney injury) (HCC) with possible progression to ckd 3b baseline creatinine is 1.6 with an EGFR of 40  uop adequate, not on nephrotoxic agents. May have progressed to ckd 3b Per nephrology - suspect acute kidney injury is likely related to multiple bouts of volume depletion. D mild bilateral hydronephrosis noted on renal ultrasound however this was not apparent on recent CT scan.  Increased  free water  admin via PEG --> no significant improvement in Cr --> nephrology consult  Dietician is adjusting tube feed  Per nephrology, start on gentle hydration but keep low threshold to stop if he develops worsening shortness of breath.  Monitor BMP  Goals of care  Discussed today w/ patient's sons on the unit  I relayed my concerns about  renal function may be new baseline or may be  progressing to renal failure and he is not a good candidate for dialysis. If concern for renal failure, would consider hospice and they seemed agreeable to this if needed.  Leukocytosis and concern for new infection/inflammation, I am completing workup today as above Anemia also not recovering, question continued slow blood loss or need epo or possible bone marrow suppression but this seems less likely, suspct will continue to be a problem w/ chronic inflammation/illness  Code status - sons state that pt had previously asked family to do everything. I relayed that given his worsening conditions, I do not have confidence he would survive CPR or be strong enough to come off life support if this were to be needed. I encouraged them to discuss as a family whether patient would want resuscitative measures if there was not a good chance such measures would return him to what he would consider meaningful QOL. They state they will think about this and discuss as a family.   Melena - resolved  Anemia chronic disease  Repeat EGD 06/10 no concerns, GI has s/o HH has been stable Follow CBC  Per nephrology consider Epogen    Severe sepsis - resolved  Initially w/ aspiration pna Later in hospital stay w/ intra-abdominal infection secondary to PEG tube not being in the right place Completed abx  Achalasia Dysphagia Aspiration risk is high  Status post botulinum toxin injection on 5/20.   Continue tube feeds N.p.o. for the foreseeable future per GI - discussed risk/benefit w/ granddaughter. Advised granddaughter that if she decides for po, she must accept risk for aspiration  SLP to follow  Will need outpatient referral to tertiary advanced GI for consideration of POEMS procedure.  Recommend this only after a few weeks recovery period.    Delirium Agitation/sundowning Granddaughter has asked to discontinue prn antipsychotics  GERD (gastroesophageal reflux disease) Continue PPI   Acute respiratory  failure with hypoxia and hypercapnia (HCC) Pneumonia HFpEF Now resolved Monitoring periodic CXR  Caution w/ IV fluids    Essential hypertension, benign Bp wnl off meds Monitor VS     Generalized weakness Therapy as tolerated SNF placement once nephrology clears / pending other clinical developments      Class 1 obesity based on BMI: Body mass index is 32.44 kg/m.Aaron Aas Significantly low or high BMI is associated with higher medical risk.  Underweight - under 18  overweight - 25 to 29 obese - 30 or more Class 1 obesity: BMI of 30.0 to 34 Class 2 obesity: BMI of 35.0 to 39 Class 3 obesity: BMI of 40.0 to 49 Super Morbid Obesity: BMI 50-59 Super-super Morbid Obesity: BMI 60+ Healthy nutrition and physical activity advised as adjunct to other disease management and risk reduction treatments    DVT prophylaxis: none w/ anemia IV fluids: no continuous IV fluids  Nutrition: tube feeds, granddaughter requests SLP eval to reconsider for CLD see above Central lines / other devices: PEG  Code Status: FULL CODE ACP documentation reviewed:  none on file in [facility]  TOC needs: placement Medical barriers to dispo: AKI. Expected medical readiness  for discharge 1-2 days              Subjective / Brief ROS:  Patient reports no concerns but is confused today States no pain.    Family Communication: have been speaking w/ granddaughter, I cannot find forms to prove her designation as HCPOA but son, [name], states she may receive information and make decisions, she is POA. I spoke w/ sons as above and I also called granddaughter as courtesy    Objective Findings:  Vitals:   11/08/23 1915 11/09/23 0407 11/09/23 0409 11/09/23 0751  BP: 104/62 (!) 146/81  (!) 140/81  Pulse: 81 93  91  Resp: 20 20  19   Temp: 99.3 F (37.4 C) 98.6 F (37 C)  98.8 F (37.1 C)  TempSrc: Oral Oral    SpO2: 92% 93%  97%  Weight:   99.3 kg   Height:        Intake/Output Summary (Last 24 hours)  at 11/09/2023 1737 Last data filed at 11/09/2023 0433 Gross per 24 hour  Intake 240 ml  Output 1250 ml  Net -1010 ml   Filed Weights   11/06/23 0441 11/07/23 0428 11/09/23 0409  Weight: 99.7 kg 99.8 kg 99.3 kg    Examination:  Physical Exam Constitutional:      General: He is not in acute distress.    Appearance: He is not ill-appearing.   Cardiovascular:     Rate and Rhythm: Normal rate and regular rhythm.  Pulmonary:     Effort: Pulmonary effort is normal.     Breath sounds: Normal breath sounds.  Abdominal:     Palpations: Abdomen is soft. There is no mass.     Tenderness: There is no abdominal tenderness.   Neurological:     Mental Status: He is alert. Mental status is at baseline. He is disoriented.   Psychiatric:        Mood and Affect: Mood normal.          Scheduled Medications:   free water   200 mL Per Tube Q2H   gentamicin  ointment   Topical TID   insulin  aspart  0-9 Units Subcutaneous Q4H   mouth rinse  15 mL Mouth Rinse 4 times per day   pantoprazole  (PROTONIX ) IV  40 mg Intravenous Q12H    Continuous Infusions:  feeding supplement (OSMOLITE 1.5 CAL) 1,000 mL (11/09/23 0851)    PRN Medications:  acetaminophen , acetaminophen , bisacodyl , haloperidol , hydrALAZINE , ipratropium-albuterol , [DISCONTINUED] ondansetron  **OR** ondansetron  (ZOFRAN ) IV, mouth rinse, mouth rinse  Antimicrobials from admission:  Anti-infectives (From admission, onward)    Start     Dose/Rate Route Frequency Ordered Stop   10/21/23 2200  Ampicillin -Sulbactam (UNASYN ) 3 g in sodium chloride  0.9 % 100 mL IVPB  Status:  Discontinued        3 g 200 mL/hr over 30 Minutes Intravenous Every 6 hours 10/21/23 2011 10/21/23 2014   10/21/23 2200  piperacillin -tazobactam (ZOSYN ) IVPB 3.375 g        3.375 g 12.5 mL/hr over 240 Minutes Intravenous Every 8 hours 10/21/23 2019 10/26/23 2359   10/06/23 1600  cefTRIAXone  (ROCEPHIN ) 2 g in sodium chloride  0.9 % 100 mL IVPB        2 g 200  mL/hr over 30 Minutes Intravenous Every 24 hours 10/06/23 1415 10/09/23 1749   10/06/23 0600  piperacillin -tazobactam (ZOSYN ) IVPB 3.375 g  Status:  Discontinued        3.375 g 12.5 mL/hr over 240 Minutes Intravenous Every 8 hours  10/06/23 0356 10/06/23 1415   10/06/23 0500  cefTRIAXone  (ROCEPHIN ) 2 g in sodium chloride  0.9 % 100 mL IVPB  Status:  Discontinued        2 g 200 mL/hr over 30 Minutes Intravenous Every 24 hours 10/05/23 0750 10/06/23 0341   10/06/23 0500  azithromycin  (ZITHROMAX ) 500 mg in sodium chloride  0.9 % 250 mL IVPB  Status:  Discontinued        500 mg 250 mL/hr over 60 Minutes Intravenous Every 24 hours 10/05/23 0750 10/06/23 1409   10/05/23 0615  metroNIDAZOLE  (FLAGYL ) IVPB 500 mg        500 mg 100 mL/hr over 60 Minutes Intravenous  Once 10/05/23 0611 10/05/23 0910   10/05/23 0545  cefTRIAXone  (ROCEPHIN ) 2 g in sodium chloride  0.9 % 100 mL IVPB        2 g 200 mL/hr over 30 Minutes Intravenous Once 10/05/23 0533 10/05/23 0650   10/05/23 0545  azithromycin  (ZITHROMAX ) 500 mg in sodium chloride  0.9 % 250 mL IVPB        500 mg 250 mL/hr over 60 Minutes Intravenous  Once 10/05/23 0533 10/05/23 0805           Data Reviewed:  I have personally reviewed the following...  CBC: Recent Labs  Lab 11/05/23 0407 11/06/23 0500 11/07/23 0446 11/08/23 0529 11/09/23 0404  WBC 12.6* 10.7* 12.8* 13.4* 15.4*  HGB 7.5* 8.8* 8.4* 8.5* 8.3*  HCT 23.0* 26.7* 26.5* 26.5* 25.8*  MCV 96.2 92.1 94.6 95.7 95.2  PLT 307 319 360 340 346   Basic Metabolic Panel: Recent Labs  Lab 11/06/23 0500 11/07/23 0446 11/07/23 1614 11/08/23 0529 11/09/23 0404  NA 143 145 144 145 145  K 4.3 4.5 4.4 4.6 4.6  CL 110 111 110 110 110  CO2 24 25 25 26 25   GLUCOSE 106* 158* 135* 140* 118*  BUN 53* 61* 61* 66* 60*  CREATININE 2.33* 2.56* 2.54* 2.69* 2.73*  CALCIUM 7.9* 7.8* 7.6* 7.7* 7.7*  MG  --   --   --   --  2.5*  PHOS  --   --   --   --  4.7*   GFR: Estimated Creatinine  Clearance: 23.4 mL/min (A) (by C-G formula based on SCr of 2.73 mg/dL (H)). Liver Function Tests: Recent Labs  Lab 11/04/23 0445  AST 44*  ALT 72*  ALKPHOS 80  BILITOT 0.4  PROT 6.5  ALBUMIN 2.0*   No results for input(s): LIPASE, AMYLASE in the last 168 hours. No results for input(s): AMMONIA in the last 168 hours. Coagulation Profile: No results for input(s): INR, PROTIME in the last 168 hours. Cardiac Enzymes: No results for input(s): CKTOTAL, CKMB, CKMBINDEX, TROPONINI in the last 168 hours. BNP (last 3 results) No results for input(s): PROBNP in the last 8760 hours. HbA1C: No results for input(s): HGBA1C in the last 72 hours. CBG: Recent Labs  Lab 11/09/23 0038 11/09/23 0359 11/09/23 0753 11/09/23 1150 11/09/23 1627  GLUCAP 140* 115* 141* 116* 131*   Lipid Profile: No results for input(s): CHOL, HDL, LDLCALC, TRIG, CHOLHDL, LDLDIRECT in the last 72 hours. Thyroid  Function Tests: No results for input(s): TSH, T4TOTAL, FREET4, T3FREE, THYROIDAB in the last 72 hours. Anemia Panel: Recent Labs    11/07/23 0445  FERRITIN 228  TIBC 258  IRON 21*   Most Recent Urinalysis On File:     Component Value Date/Time   COLORURINE YELLOW (A) 10/05/2023 0506   APPEARANCEUR HAZY (A) 10/05/2023 1610  LABSPEC 1.028 10/05/2023 0506   PHURINE 5.0 10/05/2023 0506   GLUCOSEU NEGATIVE 10/05/2023 0506   HGBUR SMALL (A) 10/05/2023 0506   BILIRUBINUR NEGATIVE 10/05/2023 0506   KETONESUR 20 (A) 10/05/2023 0506   PROTEINUR 30 (A) 10/05/2023 0506   NITRITE NEGATIVE 10/05/2023 0506   LEUKOCYTESUR NEGATIVE 10/05/2023 0506   Sepsis Labs: @LABRCNTIP (procalcitonin:4,lacticidven:4) Microbiology: No results found for this or any previous visit (from the past 240 hours).    Radiology Studies last 3 days: DG Chest Port 1 View Result Date: 11/08/2023 CLINICAL DATA:  Congestive heart failure EXAM: PORTABLE CHEST 1 VIEW COMPARISON:  Chest  radiograph dated 10/29/2023 FINDINGS: Patient is rotated to the right. Low lung volumes with bronchovascular crowding. Increased diffuse interstitial opacities. Similar trace bilateral pleural effusions. No pneumothorax. Similar enlarged cardiomediastinal silhouette. No acute osseous abnormality. IMPRESSION: 1. Increased diffuse interstitial opacities, likely pulmonary edema. 2. Similar trace bilateral pleural effusions. Electronically Signed   By: Limin  Xu M.D.   On: 11/08/2023 13:18        Maurisa Tesmer, DO [facility] 11/09/2023, 5:37 PM    Dictation software may have been used to generate the above note. Typos may occur and escape review in typed/dictated notes. Please contact Dr Authur Leghorn directly for clarity if needed.  Staff may message me via secure chat in Epic  but this may not receive an immediate response,  please page me for urgent matters!  If 7PM-7AM, please contact night coverage [email or web address]

## 2023-11-09 NOTE — TOC Progression Note (Signed)
 Transition of Care [facility]) - Progression Note    Patient Details  Name: Robert Vega MRN: [id number] Date of Birth: 1940-08-06  [facility] CM/SW Contact  Zoe Hinds, RN Phone Number: 11/09/2023, 2:43 PM  Clinical Narrative:    This CM updated by covering MD pt not medically cleared to dc today to [facility] , due to .Worsening mental status and renal function  Per chart review when pt is medically cleared he will require a ne insurance auth for bed placement at facility. TOC will cont to follow and update as applicable.    Expected Discharge Plan and Services TBD                         Social Determinants of Health (SDOH) Interventions SDOH Screenings   Food Insecurity: Patient Unable To Answer (10/06/2023)  Recent Concern: Food Insecurity - Food Insecurity Present (09/11/2023)   Received from [facility]  Housing: Patient Unable To Answer (10/06/2023)  Recent Concern: Housing - High Risk (09/11/2023)   Received from [facility]  Transportation Needs: Patient Unable To Answer (10/06/2023)  Utilities: Patient Unable To Answer (10/06/2023)  Depression (PHQ2-9): Low Risk  (05/07/2023)  Financial Resource Strain: Medium Risk (09/11/2023)   Received from [facility]  Social Connections: Unknown (10/06/2023)  Tobacco Use: Medium Risk (11/05/2023)    Readmission Risk Interventions     No data to display

## 2023-11-09 NOTE — Evaluation (Signed)
 Physical Therapy Re-Evaluation Patient Details Name: Robert Vega MRN: [id number] DOB: Sep 04, 1940 Today's Date: 11/09/2023  History of Present Illness  83 y.o male with significant PMH of OSA, GERD, Obesity, HTN, Dysphagia: EGD 03/2022 with food in upper esophagus complicated by aspiration event, cardiac arrest with round of CPR, and post resuscitation EGD with concern for lack of peristalsis. Pt presented to ED on 10/05/2023 with hypoxia, fever and generalized weakness; developed acute respiratory failure requiring intubation 5/10 due to aspiration pneumonia, extubated 5/15, but had significant agitation required brief course of Precedex ; pt now s/p exploratory laparotomy with closure of gastrotomy and insertion of gastrostomy tube on 10/22/23  Clinical Impression  Pt requiring a reevaluation due to increased hospital stay secondary to medical complications. Upon entry pt is in bed with HOB elevated, BUE mitts donned, with a sitter at bedside. Pt is obtunded at beginning of session, but awakens intermittently as treatment progresses. Currently pt requires 2+ max a for bed mobility, Max A for sitting EOB which improves to SBA/CGA with increased vc's and decreased tactile feedback, 2+ mod A for STS from significantly elevated bed height, and 2+ mod A for 4 lateral steps (tactile cues at hip for weightshifiting to unload stepping/plant foot). During standing bout bed was fully changed. Pt continues to show signs of confusion, decreased cognition and requires increased vc's and tactile cues to attempt to complete a task, limiting progression. Pt will continue to benefit from skilled PT to improve mobility and work towards PLOF.    If plan is discharge home, recommend the following: Two people to help with walking and/or transfers;Two people to help with bathing/dressing/bathroom;Supervision due to cognitive status;Direct supervision/assist for medications management;Direct supervision/assist for financial  management;Assist for transportation;Assistance with cooking/housework;Help with stairs or ramp for entrance   Can travel by private vehicle   No    Equipment Recommendations    Recommendations for Other Services       Functional Status Assessment Patient has had a recent decline in their functional status and/or demonstrates limited ability to make significant improvements in function in a reasonable and predictable amount of time     Precautions / Restrictions Precautions Precautions: Fall Recall of Precautions/Restrictions: Impaired Precaution/Restrictions Comments: abdominal binder Restrictions Weight Bearing Restrictions Per Provider Order: No Other Position/Activity Restrictions: Safety mitts donned      Mobility  Bed Mobility Overal bed mobility: Needs Assistance Bed Mobility: Rolling, Supine to Sit, Sit to Supine Rolling: +2 for physical assistance, Max assist Sidelying to sit: +2 for physical assistance, HOB elevated (Unable to assist/complete task with aformentioned parameters) Supine to sit: +2 for physical assistance, HOB elevated, Max assist (2x unsuccessful trials pt unable to assist/complete task, 1x successful trial with parameters listed) Sit to supine: Max assist, +2 for physical assistance   General bed mobility comments: Attempted sitting on the EOB x2 unsucesffuly due to increased posterior truncal lean and ridgidity, contant vc's for correction. Reattempted with log roll which followed the same result as the previous 2 trials. Reverted to supine to sit, with increased time to processs vc's, was able to complete transfer.    Transfers Overall transfer level: Needs assistance Equipment used: 2 person hand held assist Transfers: Sit to/from Stand Sit to Stand: +2 physical assistance, Mod assist, From elevated surface           General transfer comment: Increased tactile and vc's for sequencing, was able to take 4 lateral steps with mod a x2 for tactile  cues at hip for weightshifiting to  unload stepping/plant foot.    Ambulation/Gait                  Stairs            Wheelchair Mobility     Tilt Bed    Modified Rankin (Stroke Patients Only)       Balance Overall balance assessment: Needs assistance Sitting-balance support: Feet unsupported, Bilateral upper extremity supported Sitting balance-Leahy Scale: Poor Sitting balance - Comments: Attempted sitting on the EOB, mod/max a to prevent posterior lean, with increased vc's and decreased tactile feedback, pt was able to enlicit protective strategy to sit with SBA/CGA at EOB. Postural control: Posterior lean Standing balance support: Bilateral upper extremity supported, Reliant on assistive device for balance Standing balance-Leahy Scale: Poor Standing balance comment: Steady static balance with min A +2 person for safety                             Pertinent Vitals/Pain Pain Assessment Pain Assessment: Faces Faces Pain Scale: No hurt    Home Living Family/patient expects to be discharged to:: Private residence Living Arrangements: Alone Available Help at Discharge: Family;Available 24 hours/day Type of Home: House Home Access: Stairs to enter   [employer] of Steps: 2   Home Layout: One level Home Equipment: None    Prior Function Prior Level of Function : Independent/Modified Independent;History of Falls (last six months)             Mobility Comments: History of falls on uneven ground (garden and front steps)       Extremity/Trunk Assessment   Upper Extremity Assessment Upper Extremity Assessment: Defer to OT evaluation    Lower Extremity Assessment Lower Extremity Assessment: Generalized weakness (BLE grossly a 3/5)    Cervical / Trunk Assessment Cervical / Trunk Assessment: Normal  Communication   Communication Communication: Impaired Factors Affecting Communication: Reduced clarity of speech;Difficulty  expressing self    Cognition Arousal: Obtunded, Lethargic (once seated transitioned from obtunded to lethargic) Behavior During Therapy: Lability   PT - Cognitive impairments: History of cognitive impairments, Awareness, Attention, Initiation, Sequencing, Problem solving, Safety/Judgement   Orientation impairments: Person, Place (Age)                   PT - Cognition Comments: Pt has increased diifficulty following vc's throughout session. Requires continuous direction and repettition to complete a task successfully. Following commands: Impaired Following commands impaired: Follows one step commands inconsistently, Follows one step commands with increased time     Cueing Cueing Techniques: Verbal cues, Gestural cues, Tactile cues     General Comments General comments (skin integrity, edema, etc.): Abdominal binder repositioned over incisions and PEG for medical purpose at the commencement of session, incisions are clean and dry pre and post session, and PEG secure throughout treatment.  HR: 81 bpm, SpO2: 92% on room air following treatment.    Exercises     Assessment/Plan    PT Assessment Patient needs continued PT services  PT Problem List Decreased activity tolerance;Decreased balance;Decreased mobility;Decreased coordination;Decreased cognition;Decreased safety awareness;Decreased knowledge of use of DME;Pain       PT Treatment Interventions Functional mobility training;Therapeutic activities;Therapeutic exercise;Balance training;Cognitive remediation;Patient/family education;Gait training;DME instruction    PT Goals (Current goals can be found in the Care Plan section)  Acute Rehab PT Goals Patient Stated Goal: none stated PT Goal Formulation: Patient unable to participate in goal setting Time For Goal Achievement: 11/23/23 Potential  to Achieve Goals: Fair    Frequency Min 2X/week     Co-evaluation               AM-PAC PT 6 Clicks Mobility  Outcome  Measure Help needed turning from your back to your side while in a flat bed without using bedrails?: Total Help needed moving from lying on your back to sitting on the side of a flat bed without using bedrails?: Total Help needed moving to and from a bed to a chair (including a wheelchair)?: Total Help needed standing up from a chair using your arms (e.g., wheelchair or bedside chair)?: Total Help needed to walk in hospital room?: Total Help needed climbing 3-5 steps with a railing? : Total 6 Click Score: 6    End of Session Equipment Utilized During Treatment:  Activity Tolerance: Patient limited by lethargy;Patient limited by fatigue;Other (comment) (limited to cognition) Patient left: in bed;with call bell/phone within reach;with bed alarm set;with nursing/sitter in room;with family/visitor present Nurse Communication: Mobility status PT Visit Diagnosis: Other abnormalities of gait and mobility (R26.89);Muscle weakness (generalized) (M62.81)    Time: 1610-9604 PT Time Calculation (min) (ACUTE ONLY): 38 min   Charges:               Lorah Kalina, SPT 11/09/23, 4:34 PM

## 2023-11-09 NOTE — Significant Event (Signed)
       CROSS COVER NOTE  NAME: Robert Vega MRN: [id number] DOB : 09-21-1940 ATTENDING PHYSICIAN: Melodi Sprung, DO    Date of Service   11/09/2023   HPI/Events of Note   Message received from nurse patient's last BM on 6/9, had a suppository overnight with no results. abdomen appears distended and bowel sounds faint. he has a PEG tube. NPO. has been here since 5/10. Kidney function declining, WBC increasing. orders for UA but patient has not voided at all on my shift.   HPI  Hospital course from Dr Authur Leghorn note today  5/10: Admit to [facility] service with sepsis due to Aspiration Pneumonia.  Course complicated by Acute Respiratory Failure due to Aspiration of vomitus requiring s/p cardiac arrest  transfer to ICU and intubation.  5/11 flexible bronchoscopy done by critical care team 5/12 remains intubated 5/13 remains on vent, s/p PEG tube, +vomiting last night, failed SAT/SBT 5/14 extubated successfully but with severe delirium 5/20.  Botulinum toxin injection into the lower esophageal sphincter by Dr. Ole Berkeley 5/21 esophagram showing diffusely distended esophagus with distal obstruction and severe dysmotility suggestive of achalasia. 5/22.  Advised I would not give him any food and will continue PEG feeding he can try liquids to see if that goes down but still high risk for aspiration.   5/22: fall  5/23.   Awaiting insurance authorization for rehab. 5/24.  Tolerating some clear luquids\ /25.  Patient pulled out PEG tube this morning.  Foley catheter placed in the stoma.  GI able to place a another PEG tube and remove the Foley. 5/26.  Creatinine up at 1.83.  Continue IV fluid hydration.  Patient started having pain after tube feeding started.  CT scan showed that the PEG tube was not in the correct place.  Notified GI.  Started empiric antibiotics. 5/27.  Patient was taken urgently to the operating room by Dr. Cornel Diesel because the patient had fever and elevated white count with  suspected intra-abdominal sepsis. 5/28: restart tube feeds  05/30: advancing tube feeds, remains on Zosyn   06/01: on and off AKI 06/04: family working on decision for SNF  06/07: repeat CT abd d/t higher WBC, holding tube feeds but CT nothing acute  06/09: remains stable, pending SNF auth. New melena  06/10: GI repeat EGD, no bleeding  06/11: GI nothing else to add and HH stable GI has s/o, Cr up a bit, increasing free water , granddaughter requests reeval from SLP will ask them to see him tm  06/12: nephrology consult - restart IV fluids and monitor  06/14: cr continues to worsen despite fluids via peg and iv, WBC increasing, not meeting sirs/sepsis but will workup for infection.       Net fluid balance this admission +[id number] however lacks measurement accuracy  Interventions   Assessment/Plan:    11/09/2023    8:48 PM 11/09/2023    7:51 AM 11/09/2023    4:09 AM  Vitals with BMI  Weight   218 lbs 15 oz  Systolic 129 140   Diastolic 98 81   Pulse 74 91     Patient asleep some moaning with sleep but denies pain when awakened VSS  KUB IMPRESSION: 1. No acute findings. 2. Suspected gastrostomy overlying the stomach.   Senna liquid via gastric tube and continue TF with q2h water  boluses     Kip Peon NP [facility] Cross Cover 7pm-7am - check amion for availability Pager [phone]

## 2023-11-09 NOTE — Progress Notes (Addendum)
 [facility]  ROUNDING NOTE   Subjective:   Patient seen in bed, no response to commands. Mitts in place, sitter at bedside. No acute distress noted. On room air.   Objective:  Vital signs in last 24 hours:  Temp:  [98.6 F (37 C)-99.3 F (37.4 C)] 98.8 F (37.1 C) (06/14 0751) Pulse Rate:  [81-93] 91 (06/14 0751) Resp:  [19-24] 19 (06/14 0751) BP: (104-146)/(62-81) 140/81 (06/14 0751) SpO2:  [92 %-97 %] 97 % (06/14 0751) Weight:  [99.3 kg] 99.3 kg (06/14 0409)  Weight change:  Filed Weights   11/06/23 0441 11/07/23 0428 11/09/23 0409  Weight: 99.7 kg 99.8 kg 99.3 kg    Intake/Output: I/O last 3 completed shifts: In: 2160 [P.O.:240; NG/GT:1920] Out: 3250 [Urine:3250]   Intake/Output this shift:  No intake/output data recorded.  Physical Exam: General: Dry mucosal menebranes  Head: Normocephalic, atraumatic. Moist oral mucosal membranes  Eyes: Anicteric, PERRL  Neck: Supple, trachea midline  Lungs:  Room air, Clear to auscultation  Heart: Regular rate and rhythm  Abdomen:  Peg tube in place  Extremities:  Trace peripheral edema.  Neurologic: Nonfocal  Skin: No lesions  Access: None    Basic Metabolic Panel: Recent Labs  Lab 11/06/23 0500 11/07/23 0446 11/07/23 1614 11/08/23 0529 11/09/23 0404  NA 143 145 144 145 145  K 4.3 4.5 4.4 4.6 4.6  CL 110 111 110 110 110  CO2 24 25 25 26 25   GLUCOSE 106* 158* 135* 140* 118*  BUN 53* 61* 61* 66* 60*  CREATININE 2.33* 2.56* 2.54* 2.69* 2.73*  CALCIUM 7.9* 7.8* 7.6* 7.7* 7.7*  MG  --   --   --   --  2.5*  PHOS  --   --   --   --  4.7*    Liver Function Tests: Recent Labs  Lab 11/04/23 0445  AST 44*  ALT 72*  ALKPHOS 80  BILITOT 0.4  PROT 6.5  ALBUMIN 2.0*   No results for input(s): LIPASE, AMYLASE in the last 168 hours. No results for input(s): AMMONIA in the last 168 hours.  CBC: Recent Labs  Lab 11/05/23 0407 11/06/23 0500 11/07/23 0446 11/08/23 0529 11/09/23 0404  WBC  12.6* 10.7* 12.8* 13.4* 15.4*  HGB 7.5* 8.8* 8.4* 8.5* 8.3*  HCT 23.0* 26.7* 26.5* 26.5* 25.8*  MCV 96.2 92.1 94.6 95.7 95.2  PLT 307 319 360 340 346    Cardiac Enzymes: No results for input(s): CKTOTAL, CKMB, CKMBINDEX, TROPONINI in the last 168 hours.  BNP: Invalid input(s): POCBNP  CBG: Recent Labs  Lab 11/08/23 1911 11/09/23 0038 11/09/23 0359 11/09/23 0753 11/09/23 1150  GLUCAP 136* 140* 115* 141* 116*    Microbiology: Results for orders placed or performed during the hospital encounter of 10/05/23  Blood Culture (routine x 2)     Status: None   Collection Time: 10/05/23  5:06 AM   Specimen: BLOOD  Result Value Ref Range Status   Specimen Description BLOOD LA  Final   Special Requests   Final    BOTTLES DRAWN AEROBIC AND ANAEROBIC Blood Culture results may not be optimal due to an inadequate volume of blood received in culture bottles   Culture   Final    NO GROWTH 5 DAYS Performed at [facility], [address] [place], [place] [address]    Report Status 10/10/2023 FINAL  Final  Blood Culture (routine x 2)     Status: None   Collection Time: 10/05/23  5:07 AM  Specimen: BLOOD  Result Value Ref Range Status   Specimen Description BLOOD RA  Final   Special Requests   Final    BOTTLES DRAWN AEROBIC AND ANAEROBIC Blood Culture results may not be optimal due to an inadequate volume of blood received in culture bottles   Culture   Final    NO GROWTH 5 DAYS Performed at [facility], [address] [place], [place] [address]    Report Status 10/10/2023 FINAL  Final  Resp panel by RT-PCR (RSV, Flu A&B, Covid) Anterior Nasal Swab     Status: None   Collection Time: 10/05/23  5:56 AM   Specimen: Anterior Nasal Swab  Result Value Ref Range Status   SARS Coronavirus 2 by RT PCR NEGATIVE NEGATIVE Final    Comment: (NOTE) SARS-CoV-2 target nucleic acids are NOT DETECTED.  The SARS-CoV-2 RNA is generally detectable in upper  respiratory specimens during the acute phase of infection. The lowest concentration of SARS-CoV-2 viral copies this assay can detect is 138 copies/mL. A negative result does not preclude SARS-Cov-2 infection and should not be used as the sole basis for treatment or other patient management decisions. A negative result may occur with  improper specimen collection/handling, submission of specimen other than nasopharyngeal swab, presence of viral mutation(s) within the areas targeted by this assay, and inadequate number of viral copies(<138 copies/mL). A negative result must be combined with clinical observations, patient history, and epidemiological information. The expected result is Negative.  Fact Sheet for Patients:  BloggerCourse.com  Fact Sheet for Healthcare Providers:  SeriousBroker.it  This test is no t yet approved or cleared by the [place]  FDA and  has been authorized for detection and/or diagnosis of SARS-CoV-2 by FDA under an Emergency Use Authorization (EUA). This EUA will remain  in effect (meaning this test can be used) for the duration of the COVID-19 declaration under Section 564(b)(1) of the Act, 21 U.S.C.section 360bbb-3(b)(1), unless the authorization is terminated  or revoked sooner.       Influenza A by PCR NEGATIVE NEGATIVE Final   Influenza B by PCR NEGATIVE NEGATIVE Final    Comment: (NOTE) The Xpert Xpress SARS-CoV-2/FLU/RSV plus assay is intended as an aid in the diagnosis of influenza from Nasopharyngeal swab specimens and should not be used as a sole basis for treatment. Nasal washings and aspirates are unacceptable for Xpert Xpress SARS-CoV-2/FLU/RSV testing.  Fact Sheet for Patients: BloggerCourse.com  Fact Sheet for Healthcare Providers: SeriousBroker.it  This test is not yet approved or cleared by the [place]  FDA and has been  authorized for detection and/or diagnosis of SARS-CoV-2 by FDA under an Emergency Use Authorization (EUA). This EUA will remain in effect (meaning this test can be used) for the duration of the COVID-19 declaration under Section 564(b)(1) of the Act, 21 U.S.C. section 360bbb-3(b)(1), unless the authorization is terminated or revoked.     Resp Syncytial Virus by PCR NEGATIVE NEGATIVE Final    Comment: (NOTE) Fact Sheet for Patients: BloggerCourse.com  Fact Sheet for Healthcare Providers: SeriousBroker.it  This test is not yet approved or cleared by the [place]  FDA and has been authorized for detection and/or diagnosis of SARS-CoV-2 by FDA under an Emergency Use Authorization (EUA). This EUA will remain in effect (meaning this test can be used) for the duration of the COVID-19 declaration under Section 564(b)(1) of the Act, 21 U.S.C. section 360bbb-3(b)(1), unless the authorization is terminated or revoked.  Performed at [facility], [address] [place],  [place] [address]   Respiratory (~20 pathogens) panel by PCR     Status: None   Collection Time: 10/05/23  8:11 AM   Specimen: Nasopharyngeal Swab; Respiratory  Result Value Ref Range Status   Adenovirus NOT DETECTED NOT DETECTED Final   Coronavirus 229E NOT DETECTED NOT DETECTED Final    Comment: (NOTE) The Coronavirus on the Respiratory Panel, DOES NOT test for the novel  Coronavirus (2019 nCoV)    Coronavirus HKU1 NOT DETECTED NOT DETECTED Final   Coronavirus NL63 NOT DETECTED NOT DETECTED Final   Coronavirus OC43 NOT DETECTED NOT DETECTED Final   Metapneumovirus NOT DETECTED NOT DETECTED Final   Rhinovirus / Enterovirus NOT DETECTED NOT DETECTED Final   Influenza A NOT DETECTED NOT DETECTED Final   Influenza B NOT DETECTED NOT DETECTED Final   Parainfluenza Virus 1 NOT DETECTED NOT DETECTED Final   Parainfluenza Virus 2 NOT DETECTED NOT DETECTED Final    Parainfluenza Virus 3 NOT DETECTED NOT DETECTED Final   Parainfluenza Virus 4 NOT DETECTED NOT DETECTED Final   Respiratory Syncytial Virus NOT DETECTED NOT DETECTED Final   Bordetella pertussis NOT DETECTED NOT DETECTED Final   Bordetella Parapertussis NOT DETECTED NOT DETECTED Final   Chlamydophila pneumoniae NOT DETECTED NOT DETECTED Final   Mycoplasma pneumoniae NOT DETECTED NOT DETECTED Final    Comment: Performed at [facility], [address] [place], [place] [address]  Expectorated Sputum Assessment w Gram Stain, Rflx to Resp Cult     Status: None   Collection Time: 10/05/23  9:07 AM   Specimen: Sputum  Result Value Ref Range Status   Specimen Description SPUTUM  Final   Special Requests NONE  Final   Sputum evaluation   Final    Sputum specimen not acceptable for testing.  Please recollect.   C/KERRY NELSON AT 1005 10/05/23.PMF Performed at [facility], [address] [place], [place] [address]    Report Status 10/05/2023 FINAL  Final  Expectorated Sputum Assessment w Gram Stain, Rflx to Resp Cult     Status: None   Collection Time: 10/05/23 10:50 AM  Result Value Ref Range Status   Specimen Description EXPECTORATED SPUTUM  Final   Special Requests NONE  Final   Sputum evaluation   Final    THIS SPECIMEN IS ACCEPTABLE FOR SPUTUM CULTURE Performed at [facility], [address] [place], [place] [address]    Report Status 10/05/2023 FINAL  Final  Culture, Respiratory w Gram Stain     Status: None   Collection Time: 10/05/23 10:50 AM  Result Value Ref Range Status   Specimen Description   Final    EXPECTORATED SPUTUM Performed at [facility], [address] [place], [place] [address]    Special Requests   Final    NONE Reflexed from [id number] Performed at [facility], [address] [place], [place] [address]    Gram Stain   Final    RARE WBC SEEN RARE Hillis Lu POSITIVE RODS RARE Hillis Lu POSITIVE COCCI RARE GRAM  NEGATIVE RODS    Culture   Final    FEW Normal respiratory flora-no Staph aureus or Pseudomonas seen Performed at [facility], [address], [place], [place] [address]    Report Status 10/07/2023 FINAL  Final  MRSA Next Gen by PCR, Nasal     Status: None   Collection Time: 10/06/23 12:57 AM   Specimen: Nasal Mucosa; Nasal Swab  Result Value Ref Range Status  MRSA by PCR Next Gen NOT DETECTED NOT DETECTED Final    Comment: (NOTE) The GeneXpert MRSA Assay (FDA approved for NASAL specimens only), is one component of a comprehensive MRSA colonization surveillance program. It is not intended to diagnose MRSA infection nor to guide or monitor treatment for MRSA infections. Test performance is not FDA approved in patients less than 79 years old. Performed at [facility], [address] [place], [place] [address]   Culture, Respiratory w Gram Stain     Status: None   Collection Time: 10/06/23 11:37 AM   Specimen: INDUCED SPUTUM  Result Value Ref Range Status   Specimen Description   Final    INDUCED SPUTUM Performed at [facility], [address] [place], [place] [address]    Special Requests   Final    NONE Performed at [facility], [address] [place], [place] [address]    Gram Stain   Final    FEW WBC PRESENT,BOTH PMN AND MONONUCLEAR FEW GRAM POSITIVE RODS    Culture   Final    MODERATE LACTOBACILLUS FERMENTUM Standardized susceptibility testing for this organism is not available. Performed at [facility], [address] [place], [place] [address]    Report Status 10/09/2023 FINAL  Final  Culture, blood (x 2)     Status: None   Collection Time: 10/22/23  1:00 AM   Specimen: BLOOD  Result Value Ref Range Status   Specimen Description BLOOD BLOOD RIGHT ARM  Final   Special Requests   Final    BOTTLES DRAWN AEROBIC AND ANAEROBIC Blood Culture adequate volume   Culture   Final    NO GROWTH 5 DAYS Performed at [facility], [address] [place], [place] [address]    Report Status 10/27/2023 FINAL  Final  Culture, blood (x 2)     Status: None   Collection Time: 10/22/23  1:00 AM   Specimen: BLOOD  Result Value Ref Range Status   Specimen Description BLOOD BLOOD RIGHT ARM  Final   Special Requests   Final    BOTTLES DRAWN AEROBIC AND ANAEROBIC Blood Culture adequate volume   Culture   Final    NO GROWTH 5 DAYS Performed at [facility], [address] [place], [place] [address]    Report Status 10/27/2023 FINAL  Final    Coagulation Studies: No results for input(s): LABPROT, INR in the last 72 hours.  Urinalysis: No results for input(s): COLORURINE, LABSPEC, PHURINE, GLUCOSEU, HGBUR, BILIRUBINUR, KETONESUR, PROTEINUR, UROBILINOGEN, NITRITE, LEUKOCYTESUR in the last 72 hours.  Invalid input(s): APPERANCEUR    Imaging: DG Chest Port 1 View Result Date: 11/08/2023 CLINICAL DATA:  Congestive heart failure EXAM: PORTABLE CHEST 1 VIEW COMPARISON:  Chest radiograph dated 10/29/2023 FINDINGS: Patient is rotated to the right. Low lung volumes with bronchovascular crowding. Increased diffuse interstitial opacities. Similar trace bilateral pleural effusions. No pneumothorax. Similar enlarged cardiomediastinal silhouette. No acute osseous abnormality. IMPRESSION: 1. Increased diffuse interstitial opacities, likely pulmonary edema. 2. Similar trace bilateral pleural effusions. Electronically Signed   By: Limin  Xu M.D.   On: 11/08/2023 13:18     Medications:    sodium chloride  50 mL/hr at 11/09/23 0858   feeding supplement (OSMOLITE 1.5 CAL) 1,000 mL (11/09/23 0851)    free water   200 mL Per Tube Q2H   gentamicin  ointment   Topical TID   insulin  aspart  0-9 Units Subcutaneous Q4H   mouth rinse  15 mL Mouth  Rinse 4 times per day   pantoprazole  (PROTONIX ) IV  40 mg Intravenous Q12H   acetaminophen , acetaminophen , bisacodyl , haloperidol , hydrALAZINE ,  ipratropium-albuterol , [DISCONTINUED] ondansetron  **OR** ondansetron  (ZOFRAN ) IV, mouth rinse, mouth rinse  Assessment/ Plan:  Mr. Robert Vega is a 83 y.o.  male with a PMHx of obstructive sleep apnea, GERD, obesity, hypertension, dysphagia, severe esophageal dysmotility with achalasia status post PEG tube placement, recent aspiration pneumonia acute respiratory failure status post extubation 10/09/2023, who was admitted to [facility] on 10/05/2023 for evaluation of evaluation of sepsis and pneumonia secondary to aspiration pneumonia.    1.  Acute kidney injury/chronic kidney disease stage IIIb.  Suspect acute kidney injury is likely related to multiple bouts of volume depletion.  During the course of this hospitalization he has pulled the PEG tube out.  He also appears to have mild bilateral hydronephrosis noted on renal ultrasound however this was not apparent on recent CT scan.  Continue gentle hydration but keep low threshold to stop if he develops worsening shortness of breath. Continue to monitor renal indices. Supportive care for now.  Lab Results  Component Value Date   CREATININE 2.73 (H) 11/09/2023   CREATININE 2.69 (H) 11/08/2023   CREATININE 2.54 (H) 11/07/2023   Intake/Output Summary (Last 24 hours) at 11/09/2023 1154 Last data filed at 11/09/2023 0433 Gross per 24 hour  Intake 240 ml  Output 1950 ml  Net -1710 ml      2.  Anemia of chronic kidney disease.  Hemoglobin currently 8.3.  May need to consider Epogen over the course of the hospitalization. Reports of dark stool per sitter. Will notify primary team.   3.  Further plan as patient progresses.     LOS: 35 Huda Petrey P Shawan Tosh 6/14/202511:52 AM

## 2023-11-10 ENCOUNTER — Inpatient Hospital Stay

## 2023-11-10 DIAGNOSIS — A419 Sepsis, unspecified organism: Secondary | ICD-10-CM | POA: Diagnosis not present

## 2023-11-10 DIAGNOSIS — K5900 Constipation, unspecified: Secondary | ICD-10-CM | POA: Diagnosis not present

## 2023-11-10 DIAGNOSIS — R140 Abdominal distension (gaseous): Secondary | ICD-10-CM | POA: Diagnosis not present

## 2023-11-10 DIAGNOSIS — R6520 Severe sepsis without septic shock: Secondary | ICD-10-CM | POA: Diagnosis not present

## 2023-11-10 LAB — CBC
HCT: 25.7 % — ABNORMAL LOW (ref 39.0–52.0)
Hemoglobin: 8.4 g/dL — ABNORMAL LOW (ref 13.0–17.0)
MCH: 30.1 pg (ref 26.0–34.0)
MCHC: 32.7 g/dL (ref 30.0–36.0)
MCV: 92.1 fL (ref 80.0–100.0)
Platelets: 365 10*3/uL (ref 150–400)
RBC: 2.79 MIL/uL — ABNORMAL LOW (ref 4.22–5.81)
RDW: 14.1 % (ref 11.5–15.5)
WBC: 17.4 10*3/uL — ABNORMAL HIGH (ref 4.0–10.5)
nRBC: 0 % (ref 0.0–0.2)

## 2023-11-10 LAB — GLUCOSE, CAPILLARY
Glucose-Capillary: 105 mg/dL — ABNORMAL HIGH (ref 70–99)
Glucose-Capillary: 115 mg/dL — ABNORMAL HIGH (ref 70–99)
Glucose-Capillary: 123 mg/dL — ABNORMAL HIGH (ref 70–99)
Glucose-Capillary: 125 mg/dL — ABNORMAL HIGH (ref 70–99)
Glucose-Capillary: 138 mg/dL — ABNORMAL HIGH (ref 70–99)
Glucose-Capillary: 139 mg/dL — ABNORMAL HIGH (ref 70–99)

## 2023-11-10 LAB — URINALYSIS, W/ REFLEX TO CULTURE (INFECTION SUSPECTED)
Bacteria, UA: NONE SEEN
Bilirubin Urine: NEGATIVE
Glucose, UA: NEGATIVE mg/dL
Hgb urine dipstick: NEGATIVE
Ketones, ur: NEGATIVE mg/dL
Nitrite: NEGATIVE
Protein, ur: 100 mg/dL — AB
Specific Gravity, Urine: 1.01 (ref 1.005–1.030)
Squamous Epithelial / HPF: 0 /HPF (ref 0–5)
pH: 6 (ref 5.0–8.0)

## 2023-11-10 LAB — COMPREHENSIVE METABOLIC PANEL WITH GFR
ALT: 84 U/L — ABNORMAL HIGH (ref 0–44)
AST: 73 U/L — ABNORMAL HIGH (ref 15–41)
Albumin: 1.9 g/dL — ABNORMAL LOW (ref 3.5–5.0)
Alkaline Phosphatase: 92 U/L (ref 38–126)
Anion gap: 10 (ref 5–15)
BUN: 101 mg/dL — ABNORMAL HIGH (ref 8–23)
CO2: 22 mmol/L (ref 22–32)
Calcium: 7.3 mg/dL — ABNORMAL LOW (ref 8.9–10.3)
Chloride: 101 mmol/L (ref 98–111)
Creatinine, Ser: 5.72 mg/dL — ABNORMAL HIGH (ref 0.61–1.24)
GFR, Estimated: 9 mL/min — ABNORMAL LOW (ref >60)
Glucose, Bld: 106 mg/dL — ABNORMAL HIGH (ref 70–99)
Potassium: 5.9 mmol/L — ABNORMAL HIGH (ref 3.5–5.1)
Sodium: 133 mmol/L — ABNORMAL LOW (ref 135–145)
Total Bilirubin: 0.8 mg/dL (ref 0.0–1.2)
Total Protein: 6.4 g/dL — ABNORMAL LOW (ref 6.5–8.1)

## 2023-11-10 LAB — RESPIRATORY PANEL BY PCR

## 2023-11-10 LAB — BASIC METABOLIC PANEL WITH GFR
Anion gap: 10 (ref 5–15)
BUN: 85 mg/dL — ABNORMAL HIGH (ref 8–23)
CO2: 24 mmol/L (ref 22–32)
Calcium: 7.3 mg/dL — ABNORMAL LOW (ref 8.9–10.3)
Chloride: 101 mmol/L (ref 98–111)
Creatinine, Ser: 4.9 mg/dL — ABNORMAL HIGH (ref 0.61–1.24)
GFR, Estimated: 11 mL/min — ABNORMAL LOW (ref >60)
Glucose, Bld: 144 mg/dL — ABNORMAL HIGH (ref 70–99)
Potassium: 5.4 mmol/L — ABNORMAL HIGH (ref 3.5–5.1)
Sodium: 135 mmol/L (ref 135–145)

## 2023-11-10 MED ORDER — IPRATROPIUM-ALBUTEROL 0.5-2.5 (3) MG/3ML IN SOLN
3.0000 mL | RESPIRATORY_TRACT | Status: DC | PRN
  Administered 2023-11-15 – 2023-11-30 (×9): 3 mL via RESPIRATORY_TRACT
  Filled 2023-11-10 (×9): qty 3

## 2023-11-10 MED ORDER — ACETAMINOPHEN 650 MG RE SUPP: 650.0000 mg | Freq: Four times a day (QID) | RECTAL | Status: DC | PRN

## 2023-11-10 MED ORDER — GLYCOPYRROLATE 0.2 MG/ML IJ SOLN
0.2000 mg | INTRAMUSCULAR | Status: DC | PRN
Start: 2023-11-10 — End: 2023-11-10

## 2023-11-10 MED ORDER — LORAZEPAM 2 MG/ML IJ SOLN: 2.0000 mg | INTRAMUSCULAR | Status: DC | PRN

## 2023-11-10 MED ORDER — MORPHINE SULFATE (PF) 2 MG/ML IV SOLN: 2.0000 mg | INTRAVENOUS | Status: DC | PRN

## 2023-11-10 MED ORDER — INSULIN ASPART 100 UNIT/ML IJ SOLN
10.0000 [IU] | Freq: Once | INTRAMUSCULAR | Status: AC
  Administered 2023-11-10: 10 [IU] via SUBCUTANEOUS
  Filled 2023-11-10: qty 1

## 2023-11-10 MED ORDER — LACTATED RINGERS IV SOLN: INTRAVENOUS | Status: DC

## 2023-11-10 MED ORDER — OSMOLITE 1.5 CAL PO LIQD
1000.0000 mL | ORAL | Status: DC
  Administered 2023-11-10: 1000 mL

## 2023-11-10 MED ORDER — DEXTROSE 50 % IV SOLN
1.0000 | Freq: Once | INTRAVENOUS | Status: AC
  Administered 2023-11-10: 50 mL via INTRAVENOUS
  Filled 2023-11-10: qty 50

## 2023-11-10 MED ORDER — SENNOSIDES 8.8 MG/5ML PO SYRP
10.0000 mL | ORAL_SOLUTION | Freq: Once | ORAL | Status: AC
  Administered 2023-11-10: 10 mL via ORAL
  Filled 2023-11-10: qty 10

## 2023-11-10 MED ORDER — CHLORHEXIDINE GLUCONATE CLOTH 2 % EX PADS
6.0000 | MEDICATED_PAD | Freq: Every day | CUTANEOUS | Status: DC
  Administered 2023-11-10 – 2023-11-16 (×6): 6 via TOPICAL

## 2023-11-10 MED ORDER — PANTOPRAZOLE SODIUM 40 MG IV SOLR
40.0000 mg | Freq: Two times a day (BID) | INTRAVENOUS | Status: DC
  Administered 2023-11-10 – 2023-12-16 (×63): 40 mg via INTRAVENOUS
  Filled 2023-11-10 (×65): qty 10

## 2023-11-10 MED ORDER — DIPHENHYDRAMINE HCL 50 MG/ML IJ SOLN: 25.0000 mg | INTRAMUSCULAR | Status: DC | PRN

## 2023-11-10 MED ORDER — PROSOURCE TF20 ENFIT COMPATIBL EN LIQD: 60.0000 mL | Freq: Two times a day (BID) | ENTERAL | Status: DC

## 2023-11-10 MED ORDER — GENTAMICIN SULFATE 0.1 % EX OINT
TOPICAL_OINTMENT | Freq: Three times a day (TID) | CUTANEOUS | Status: DC
  Administered 2023-11-19 – 2023-11-26 (×2): 1 via TOPICAL
  Filled 2023-11-10 (×3): qty 15

## 2023-11-10 MED ORDER — ZIPRASIDONE MESYLATE 20 MG IM SOLR: 10.0000 mg | Freq: Two times a day (BID) | INTRAMUSCULAR | Status: DC | PRN

## 2023-11-10 MED ORDER — SODIUM CHLORIDE 0.9 % IV SOLN: INTRAVENOUS | Status: DC

## 2023-11-10 MED ORDER — ACETAMINOPHEN 325 MG PO TABS
650.0000 mg | ORAL_TABLET | Freq: Four times a day (QID) | ORAL | Status: DC | PRN
  Administered 2023-11-14 – 2023-12-10 (×9): 650 mg via ORAL
  Filled 2023-11-10 (×9): qty 2

## 2023-11-10 MED ORDER — SODIUM ZIRCONIUM CYCLOSILICATE 5 G PO PACK
5.0000 g | PACK | Freq: Every day | ORAL | Status: DC
  Administered 2023-11-10 – 2023-11-14 (×5): 5 g
  Filled 2023-11-10 (×6): qty 1

## 2023-11-10 MED ORDER — SODIUM CHLORIDE 0.9 % IV SOLN
1.0000 g | INTRAVENOUS | Status: DC
  Administered 2023-11-10 – 2023-11-11 (×2): 1 g via INTRAVENOUS
  Filled 2023-11-10 (×2): qty 10

## 2023-11-10 MED ORDER — LACTATED RINGERS IV BOLUS
500.0000 mL | Freq: Once | INTRAVENOUS | Status: AC
  Administered 2023-11-10: 500 mL via INTRAVENOUS

## 2023-11-10 MED ORDER — GLYCOPYRROLATE 0.2 MG/ML IJ SOLN: 0.2000 mg | INTRAMUSCULAR | Status: DC | PRN

## 2023-11-10 MED ORDER — FREE WATER
200.0000 mL | Status: DC
  Administered 2023-11-10 – 2023-11-11 (×6): 200 mL

## 2023-11-10 MED ORDER — POLYVINYL ALCOHOL 1.4 % OP SOLN
1.0000 [drp] | Freq: Four times a day (QID) | OPHTHALMIC | Status: DC | PRN
Start: 2023-11-10 — End: 2023-12-16

## 2023-11-10 MED ORDER — GLYCOPYRROLATE 1 MG PO TABS: 1.0000 mg | ORAL_TABLET | ORAL | Status: DC | PRN

## 2023-11-10 NOTE — Progress Notes (Addendum)
 [facility]  ROUNDING NOTE   Subjective:   Two sons and friend at bedside.  Patient nonverbal and somnulent.   UOP 50  Creatinine 4.9 (2.73)  Patient without tube feeds or IV fluids running when examined.   Objective:  Vital signs in last 24 hours:  Temp:  [97.7 F (36.5 C)-99.6 F (37.6 C)] 99.6 F (37.6 C) (06/15 0748) Pulse Rate:  [74-94] 94 (06/15 0748) Resp:  [18-20] 18 (06/15 0454) BP: (125-133)/(69-98) 133/69 (06/15 0748) SpO2:  [93 %-95 %] 95 % (06/15 0748) Weight:  [102 kg] 102 kg (06/15 0500)  Weight change: 2.7 kg Filed Weights   11/07/23 0428 11/09/23 0409 11/10/23 0500  Weight: 99.8 kg 99.3 kg 102 kg    Intake/Output: I/O last 3 completed shifts: In: 2370.1 [NG/GT:2370.1] Out: 1300 [Urine:1300]   Intake/Output this shift:  Total I/O In: 643.1 [NG/GT:643.1] Out: 0   Physical Exam: General: somnulent  Head: Dry mucosal membranes  Eyes: Anicteric, PERRL  Neck: Supple, trachea midline  Lungs:  Room air, Clear to auscultation  Heart: Regular rate and rhythm  Abdomen:  Peg tube in place, midline incision  Extremities:  Trace peripheral edema.  Neurologic: nonresponsive  Skin: No lesions  Access: None    Basic Metabolic Panel: Recent Labs  Lab 11/07/23 0446 11/07/23 1614 11/08/23 0529 11/09/23 0404 11/10/23 0537  NA 145 144 145 145 135  K 4.5 4.4 4.6 4.6 5.4*  CL 111 110 110 110 101  CO2 25 25 26 25 24   GLUCOSE 158* 135* 140* 118* 144*  BUN 61* 61* 66* 60* 85*  CREATININE 2.56* 2.54* 2.69* 2.73* 4.90*  CALCIUM 7.8* 7.6* 7.7* 7.7* 7.3*  MG  --   --   --  2.5*  --   PHOS  --   --   --  4.7*  --     Liver Function Tests: Recent Labs  Lab 11/04/23 0445  AST 44*  ALT 72*  ALKPHOS 80  BILITOT 0.4  PROT 6.5  ALBUMIN 2.0*   No results for input(s): LIPASE, AMYLASE in the last 168 hours. No results for input(s): AMMONIA in the last 168 hours.  CBC: Recent Labs  Lab 11/05/23 0407 11/06/23 0500 11/07/23 0446  11/08/23 0529 11/09/23 0404  WBC 12.6* 10.7* 12.8* 13.4* 15.4*  HGB 7.5* 8.8* 8.4* 8.5* 8.3*  HCT 23.0* 26.7* 26.5* 26.5* 25.8*  MCV 96.2 92.1 94.6 95.7 95.2  PLT 307 319 360 340 346    Cardiac Enzymes: No results for input(s): CKTOTAL, CKMB, CKMBINDEX, TROPONINI in the last 168 hours.  BNP: Invalid input(s): POCBNP  CBG: Recent Labs  Lab 11/09/23 1627 11/09/23 2044 11/10/23 0030 11/10/23 0504 11/10/23 0830  GLUCAP 131* 148* 123* 125* 138*    Microbiology: Results for orders placed or performed during the hospital encounter of 10/05/23  Blood Culture (routine x 2)     Status: None   Collection Time: 10/05/23  5:06 AM   Specimen: BLOOD  Result Value Ref Range Status   Specimen Description BLOOD LA  Final   Special Requests   Final    BOTTLES DRAWN AEROBIC AND ANAEROBIC Blood Culture results may not be optimal due to an inadequate volume of blood received in culture bottles   Culture   Final    NO GROWTH 5 DAYS Performed at [facility], [address] [place], [place] [address]    Report Status 10/10/2023 FINAL  Final  Blood Culture (routine x 2)  Status: None   Collection Time: 10/05/23  5:07 AM   Specimen: BLOOD  Result Value Ref Range Status   Specimen Description BLOOD RA  Final   Special Requests   Final    BOTTLES DRAWN AEROBIC AND ANAEROBIC Blood Culture results may not be optimal due to an inadequate volume of blood received in culture bottles   Culture   Final    NO GROWTH 5 DAYS Performed at [facility], [address] [place], [place] [address]    Report Status 10/10/2023 FINAL  Final  Resp panel by RT-PCR (RSV, Flu A&B, Covid) Anterior Nasal Swab     Status: None   Collection Time: 10/05/23  5:56 AM   Specimen: Anterior Nasal Swab  Result Value Ref Range Status   SARS Coronavirus 2 by RT PCR NEGATIVE NEGATIVE Final    Comment: (NOTE) SARS-CoV-2 target nucleic acids are NOT DETECTED.  The SARS-CoV-2 RNA is  generally detectable in upper respiratory specimens during the acute phase of infection. The lowest concentration of SARS-CoV-2 viral copies this assay can detect is 138 copies/mL. A negative result does not preclude SARS-Cov-2 infection and should not be used as the sole basis for treatment or other patient management decisions. A negative result may occur with  improper specimen collection/handling, submission of specimen other than nasopharyngeal swab, presence of viral mutation(s) within the areas targeted by this assay, and inadequate number of viral copies(<138 copies/mL). A negative result must be combined with clinical observations, patient history, and epidemiological information. The expected result is Negative.  Fact Sheet for Patients:  BloggerCourse.com  Fact Sheet for Healthcare Providers:  SeriousBroker.it  This test is no t yet approved or cleared by the [place]  FDA and  has been authorized for detection and/or diagnosis of SARS-CoV-2 by FDA under an Emergency Use Authorization (EUA). This EUA will remain  in effect (meaning this test can be used) for the duration of the COVID-19 declaration under Section 564(b)(1) of the Act, 21 U.S.C.section 360bbb-3(b)(1), unless the authorization is terminated  or revoked sooner.       Influenza A by PCR NEGATIVE NEGATIVE Final   Influenza B by PCR NEGATIVE NEGATIVE Final    Comment: (NOTE) The Xpert Xpress SARS-CoV-2/FLU/RSV plus assay is intended as an aid in the diagnosis of influenza from Nasopharyngeal swab specimens and should not be used as a sole basis for treatment. Nasal washings and aspirates are unacceptable for Xpert Xpress SARS-CoV-2/FLU/RSV testing.  Fact Sheet for Patients: BloggerCourse.com  Fact Sheet for Healthcare Providers: SeriousBroker.it  This test is not yet approved or cleared by the [place] FDA and has been authorized for detection and/or diagnosis of SARS-CoV-2 by FDA under an Emergency Use Authorization (EUA). This EUA will remain in effect (meaning this test can be used) for the duration of the COVID-19 declaration under Section 564(b)(1) of the Act, 21 U.S.C. section 360bbb-3(b)(1), unless the authorization is terminated or revoked.     Resp Syncytial Virus by PCR NEGATIVE NEGATIVE Final    Comment: (NOTE) Fact Sheet for Patients: BloggerCourse.com  Fact Sheet for Healthcare Providers: SeriousBroker.it  This test is not yet approved or cleared by the [place]  FDA and has been authorized for detection and/or diagnosis of SARS-CoV-2 by FDA under an Emergency Use Authorization (EUA). This EUA will remain in effect (meaning this test can be used) for the duration of the COVID-19 declaration under Section 564(b)(1) of the Act, 21 U.S.C. section 360bbb-3(b)(1), unless the authorization is terminated or  revoked.  Performed at [facility], [address] [place], [place] [address]   Respiratory (~20 pathogens) panel by PCR     Status: None   Collection Time: 10/05/23  8:11 AM   Specimen: Nasopharyngeal Swab; Respiratory  Result Value Ref Range Status   Adenovirus NOT DETECTED NOT DETECTED Final   Coronavirus 229E NOT DETECTED NOT DETECTED Final    Comment: (NOTE) The Coronavirus on the Respiratory Panel, DOES NOT test for the novel  Coronavirus (2019 nCoV)    Coronavirus HKU1 NOT DETECTED NOT DETECTED Final   Coronavirus NL63 NOT DETECTED NOT DETECTED Final   Coronavirus OC43 NOT DETECTED NOT DETECTED Final   Metapneumovirus NOT DETECTED NOT DETECTED Final   Rhinovirus / Enterovirus NOT DETECTED NOT DETECTED Final   Influenza A NOT DETECTED NOT DETECTED Final   Influenza B NOT DETECTED NOT DETECTED Final   Parainfluenza Virus 1 NOT DETECTED NOT DETECTED Final   Parainfluenza Virus 2 NOT  DETECTED NOT DETECTED Final   Parainfluenza Virus 3 NOT DETECTED NOT DETECTED Final   Parainfluenza Virus 4 NOT DETECTED NOT DETECTED Final   Respiratory Syncytial Virus NOT DETECTED NOT DETECTED Final   Bordetella pertussis NOT DETECTED NOT DETECTED Final   Bordetella Parapertussis NOT DETECTED NOT DETECTED Final   Chlamydophila pneumoniae NOT DETECTED NOT DETECTED Final   Mycoplasma pneumoniae NOT DETECTED NOT DETECTED Final    Comment: Performed at [facility], [address] [place], [place] [address]  Expectorated Sputum Assessment w Gram Stain, Rflx to Resp Cult     Status: None   Collection Time: 10/05/23  9:07 AM   Specimen: Sputum  Result Value Ref Range Status   Specimen Description SPUTUM  Final   Special Requests NONE  Final   Sputum evaluation   Final    Sputum specimen not acceptable for testing.  Please recollect.   C/KERRY NELSON AT 1005 10/05/23.PMF Performed at [facility], [address] [place], [place] [address]    Report Status 10/05/2023 FINAL  Final  Expectorated Sputum Assessment w Gram Stain, Rflx to Resp Cult     Status: None   Collection Time: 10/05/23 10:50 AM  Result Value Ref Range Status   Specimen Description EXPECTORATED SPUTUM  Final   Special Requests NONE  Final   Sputum evaluation   Final    THIS SPECIMEN IS ACCEPTABLE FOR SPUTUM CULTURE Performed at [facility], [address], [place], [place] [address]    Report Status 10/05/2023 FINAL  Final  Culture, Respiratory w Gram Stain     Status: None   Collection Time: 10/05/23 10:50 AM  Result Value Ref Range Status   Specimen Description   Final    EXPECTORATED SPUTUM Performed at [facility], [address], [place], [place] [address]    Special Requests   Final    NONE Reflexed from [phone] Performed at [facility], [address] [place], [place] [address]    Gram Stain   Final    RARE WBC SEEN RARE Hillis Lu POSITIVE RODS RARE Hillis Lu  POSITIVE COCCI RARE GRAM NEGATIVE RODS    Culture   Final    FEW Normal respiratory flora-no Staph aureus or Pseudomonas seen Performed at [facility], [address] [place], [place] [address]    Report Status 10/07/2023 FINAL  Final  MRSA Next Gen by PCR, Nasal     Status: None   Collection Time: 10/06/23 12:57 AM   Specimen:  Nasal Mucosa; Nasal Swab  Result Value Ref Range Status   MRSA by PCR Next Gen NOT DETECTED NOT DETECTED Final    Comment: (NOTE) The GeneXpert MRSA Assay (FDA approved for NASAL specimens only), is one component of a comprehensive MRSA colonization surveillance program. It is not intended to diagnose MRSA infection nor to guide or monitor treatment for MRSA infections. Test performance is not FDA approved in patients less than 70 years old. Performed at [facility], [address] [place], [place] [address]   Culture, Respiratory w Gram Stain     Status: None   Collection Time: 10/06/23 11:37 AM   Specimen: INDUCED SPUTUM  Result Value Ref Range Status   Specimen Description   Final    INDUCED SPUTUM Performed at [facility], [address], [place], [place] [address]    Special Requests   Final    NONE Performed at [facility], [address] [place], [place] [address]    Gram Stain   Final    FEW WBC PRESENT,BOTH PMN AND MONONUCLEAR FEW GRAM POSITIVE RODS    Culture   Final    MODERATE LACTOBACILLUS FERMENTUM Standardized susceptibility testing for this organism is not available. Performed at [facility], [address] [place], [place] [address]    Report Status 10/09/2023 FINAL  Final  Culture, blood (x 2)     Status: None   Collection Time: 10/22/23  1:00 AM   Specimen: BLOOD  Result Value Ref Range Status   Specimen Description BLOOD BLOOD RIGHT ARM  Final   Special Requests   Final    BOTTLES DRAWN AEROBIC AND ANAEROBIC Blood Culture adequate volume   Culture   Final    NO GROWTH 5  DAYS Performed at [facility], [address] [place], [place] [address]    Report Status 10/27/2023 FINAL  Final  Culture, blood (x 2)     Status: None   Collection Time: 10/22/23  1:00 AM   Specimen: BLOOD  Result Value Ref Range Status   Specimen Description BLOOD BLOOD RIGHT ARM  Final   Special Requests   Final    BOTTLES DRAWN AEROBIC AND ANAEROBIC Blood Culture adequate volume   Culture   Final    NO GROWTH 5 DAYS Performed at [facility], [address] [place], [place] [address]    Report Status 10/27/2023 FINAL  Final  Resp panel by RT-PCR (RSV, Flu A&B, Covid) Anterior Nasal Swab     Status: None   Collection Time: 11/09/23  5:31 PM   Specimen: Anterior Nasal Swab  Result Value Ref Range Status   SARS Coronavirus 2 by RT PCR NEGATIVE NEGATIVE Final    Comment: (NOTE) SARS-CoV-2 target nucleic acids are NOT DETECTED.  The SARS-CoV-2 RNA is generally detectable in upper respiratory specimens during the acute phase of infection. The lowest concentration of SARS-CoV-2 viral copies this assay can detect is 138 copies/mL. A negative result does not preclude SARS-Cov-2 infection and should not be used as the sole basis for treatment or other patient management decisions. A negative result may occur with  improper specimen collection/handling, submission of specimen other than nasopharyngeal swab, presence of viral mutation(s) within the areas targeted by this assay, and inadequate number of viral copies(<138 copies/mL). A negative result must be combined with clinical observations, patient history, and epidemiological information. The expected result is Negative.  Fact Sheet for Patients:  BloggerCourse.com  Fact Sheet for Healthcare  Providers:  SeriousBroker.it  This test is no t yet approved or cleared by the [place]  FDA and  has been authorized for detection and/or diagnosis of SARS-CoV-2  by FDA under an Emergency Use Authorization (EUA). This EUA will remain  in effect (meaning this test can be used) for the duration of the COVID-19 declaration under Section 564(b)(1) of the Act, 21 U.S.C.section 360bbb-3(b)(1), unless the authorization is terminated  or revoked sooner.       Influenza A by PCR NEGATIVE NEGATIVE Final   Influenza B by PCR NEGATIVE NEGATIVE Final    Comment: (NOTE) The Xpert Xpress SARS-CoV-2/FLU/RSV plus assay is intended as an aid in the diagnosis of influenza from Nasopharyngeal swab specimens and should not be used as a sole basis for treatment. Nasal washings and aspirates are unacceptable for Xpert Xpress SARS-CoV-2/FLU/RSV testing.  Fact Sheet for Patients: BloggerCourse.com  Fact Sheet for Healthcare Providers: SeriousBroker.it  This test is not yet approved or cleared by the [place]  FDA and has been authorized for detection and/or diagnosis of SARS-CoV-2 by FDA under an Emergency Use Authorization (EUA). This EUA will remain in effect (meaning this test can be used) for the duration of the COVID-19 declaration under Section 564(b)(1) of the Act, 21 U.S.C. section 360bbb-3(b)(1), unless the authorization is terminated or revoked.     Resp Syncytial Virus by PCR NEGATIVE NEGATIVE Final    Comment: (NOTE) Fact Sheet for Patients: BloggerCourse.com  Fact Sheet for Healthcare Providers: SeriousBroker.it  This test is not yet approved or cleared by the [place]  FDA and has been authorized for detection and/or diagnosis of SARS-CoV-2 by FDA under an Emergency Use Authorization (EUA). This EUA will remain in effect (meaning this test can be used) for the duration of the COVID-19 declaration under Section 564(b)(1) of the Act, 21 U.S.C. section 360bbb-3(b)(1), unless the authorization is terminated or revoked.  Performed at  [facility], [address] [place], [place] [address]   Respiratory (~20 pathogens) panel by PCR     Status: None   Collection Time: 11/09/23  5:31 PM   Specimen: Nasopharyngeal Swab; Respiratory  Result Value Ref Range Status   Adenovirus NOT DETECTED NOT DETECTED Final   Coronavirus 229E NOT DETECTED NOT DETECTED Final    Comment: (NOTE) The Coronavirus on the Respiratory Panel, DOES NOT test for the novel  Coronavirus (2019 nCoV)    Coronavirus HKU1 NOT DETECTED NOT DETECTED Final   Coronavirus NL63 NOT DETECTED NOT DETECTED Final   Coronavirus OC43 NOT DETECTED NOT DETECTED Final   Metapneumovirus NOT DETECTED NOT DETECTED Final   Rhinovirus / Enterovirus NOT DETECTED NOT DETECTED Final   Influenza A NOT DETECTED NOT DETECTED Final   Influenza B NOT DETECTED NOT DETECTED Final   Parainfluenza Virus 1 NOT DETECTED NOT DETECTED Final   Parainfluenza Virus 2 NOT DETECTED NOT DETECTED Final   Parainfluenza Virus 3 NOT DETECTED NOT DETECTED Final   Parainfluenza Virus 4 NOT DETECTED NOT DETECTED Final   Respiratory Syncytial Virus NOT DETECTED NOT DETECTED Final   Bordetella pertussis NOT DETECTED NOT DETECTED Final   Bordetella Parapertussis NOT DETECTED NOT DETECTED Final   Chlamydophila pneumoniae NOT DETECTED NOT DETECTED Final   Mycoplasma pneumoniae NOT DETECTED NOT DETECTED Final    Comment: Performed at [facility], [address], [place], [place] [address]  Culture, blood (Routine X 2) w Reflex to ID Panel     Status: None (Preliminary result)   Collection Time: 11/09/23  6:16 PM   Specimen: BLOOD  Result Value Ref Range Status   Specimen Description BLOOD RIGHT ANTECUBITAL  Final   Special Requests   Final    BOTTLES DRAWN AEROBIC ONLY Blood Culture adequate volume   Culture   Final    NO GROWTH < 12 HOURS Performed at [facility], [address] [place], [place] [address]    Report Status PENDING  Incomplete  Culture, blood (Routine  X 2) w Reflex to ID Panel     Status: None (Preliminary result)   Collection Time: 11/09/23  6:23 PM   Specimen: BLOOD  Result Value Ref Range Status   Specimen Description BLOOD BLOOD LEFT FOREARM  Final   Special Requests   Final    BOTTLES DRAWN AEROBIC AND ANAEROBIC Blood Culture adequate volume   Culture   Final    NO GROWTH < 12 HOURS Performed at [facility], [address] [place], [place] [address]    Report Status PENDING  Incomplete    Coagulation Studies: No results for input(s): LABPROT, INR in the last 72 hours.  Urinalysis: Recent Labs    11/10/23 0158  COLORURINE YELLOW*  LABSPEC 1.010  PHURINE 6.0  GLUCOSEU NEGATIVE  HGBUR NEGATIVE  BILIRUBINUR NEGATIVE  KETONESUR NEGATIVE  PROTEINUR 100*  NITRITE NEGATIVE  LEUKOCYTESUR TRACE*      Imaging: CT ABDOMEN PELVIS WO CONTRAST Result Date: 11/10/2023 CLINICAL DATA:  Gastrostomy, abdominal distension. EXAM: CT ABDOMEN AND PELVIS WITHOUT CONTRAST TECHNIQUE: Multidetector CT imaging of the abdomen and pelvis was performed following the standard protocol without IV contrast. RADIATION DOSE REDUCTION: This exam was performed according to the departmental dose-optimization program which includes automated exposure control, adjustment of the mA and/or kV according to patient size and/or use of iterative reconstruction technique. COMPARISON:  11/02/2023. FINDINGS: Lower chest: Right-sided small pleural effusion and right base consolidation or volume loss. Minimal subsegmental atelectasis left base. Atheromatous calcifications of coronary arteries. No pericardial effusion. Decompressed moderate-sized paraesophageal hiatal hernia. Hepatobiliary: No hepatic parenchymal abnormalities. Small amount of milk of calcium or tiny calcified stones in the gallbladder. Gallbladder is contracted. Pancreas: Unremarkable. No pancreatic ductal dilatation or surrounding inflammatory changes. Spleen: Normal in size without focal  abnormality. Adrenals/Urinary Tract: No adrenal lesions. Bilateral hydronephrosis and hydroureter without evidence of stones. Urinary bladder is empty. Stomach/Bowel: Gastrostomy in the stomach. No bowel dilatation to suggest obstruction. Normal appendix. Vascular/Lymphatic: Aortic atherosclerosis. No enlarged abdominal or pelvic lymph nodes. Reproductive: Prostate is unremarkable. Other: No abdominal wall defects. There is fluid in the pararenal space as well as in the paracolic gutters. Edema or inflammatory changes represent an interval change from the prior study extending along the pericolic gutters bilaterally as well as in the lower abdominal retroperitoneum. Musculoskeletal: No acute or significant osseous findings. IMPRESSION: 1. Right-sided pleural effusion and right base consolidation or volume loss. 2. Bilateral hydronephrosis and hydroureter without evidence of stones. 3. Fluid in the pararenal space and paracolic gutters. Inflammatory edematous changes extend into the pelvis in the presacral region. 4. Moderate-sized paraesophageal hiatal hernia. Gastrostomy appropriately positioned in the stomach. 5. Aortic atherosclerosis (ICD10-I70.0). Electronically Signed   By: Sydell Eva M.D.   On: 11/10/2023 10:14   DG Abd 1 View Result Date: 11/10/2023 EXAM: 1 VIEW XRAY OF THE ABDOMEN SUPINE 11/10/2023 12:08:49 AM COMPARISON: CT abdomen/pelvis dated 12/02/2023. CLINICAL HISTORY: Abdominal distension, constipation. FINDINGS: BOWEL: Nonobstructive bowel gas pattern. Residual contrast in the distal colon with suspected sigmoid diverticulosis. PERITONEUM AND SOFT TISSUES: Suspected gastrostomy  overlying the stomach. BONES: Mild degenerative changes of the right hip. No acute osseous abnormality. IMPRESSION: 1. No acute findings. 2. Suspected gastrostomy overlying the stomach. Electronically signed by: Zadie Herter MD 11/10/2023 12:15 AM EDT RP Workstation: [id number]     Medications:    sodium  chloride      mouth rinse  15 mL Mouth Rinse 4 times per day   acetaminophen  **OR** acetaminophen , artificial tears, bisacodyl , diphenhydrAMINE, glycopyrrolate  **OR** glycopyrrolate  **OR** glycopyrrolate , ipratropium-albuterol , LORazepam , morphine  injection, [DISCONTINUED] ondansetron  **OR** ondansetron  (ZOFRAN ) IV, mouth rinse, mouth rinse, ziprasidone  Assessment/ Plan:  Robert Vega is a 83 y.o.  male with obstructive sleep apnea, GERD, obesity, hypertension, dysphagia, severe esophageal dysmotility with achalasia status post PEG tube placement, recent aspiration pneumonia acute respiratory failure status post extubation 10/09/2023, who was admitted to [facility] on 10/05/2023 for Aspiration into respiratory tract, initial encounter [T17.908A] Severe sepsis (HCC) [A41.9, R65.20] History of dysphagia [Z87.898] Fever, unspecified fever cause [R50.9] Multifocal pneumonia [J18.9]   1.  Acute kidney injury on chronic kidney disease stage IIIb.  Acute kidney injury secondary to ATN, obstructive uropathy, and progression of prerenal azotemia. Progressive renal failure with anuric urine output.  Not a candidate for dialysis due to worsening presentation.  Unable to tolerate IV fluids or tube feeds - recommend supportive care. Will not offer dialysis at this time. Very low chance of meaningful overall clinical recovery at this time. - Case discussed with family at bedside.    Lab Results  Component Value Date   CREATININE 4.90 (H) 11/10/2023   CREATININE 2.73 (H) 11/09/2023   CREATININE 2.69 (H) 11/08/2023   Intake/Output Summary (Last 24 hours) at 11/10/2023 1216 Last data filed at 11/10/2023 1000 Gross per 24 hour  Intake 3013.17 ml  Output 50 ml  Net 2963.17 ml      2.  Anemia with chronic kidney disease.  Hemoglobin 8.3. normocytic. With GI bleed.  - not a candidate for ESA   3.  Goals of care: overall prognosis is poor. Discussed with family. Will attempt to reach out to  granddaughter, [name], later today.   Addendum: Spoke to Granddaughter who is very optimistic about recovery. She is asking for IVF, foley catheter placement and ESA. She is amendable to start dialysis if need be. I will let her and the sons make final decisions before making any changes.      LOS: 36 Lamar Meter 6/15/202512:16 PM

## 2023-11-10 NOTE — Plan of Care (Signed)
  Problem: Education: Goal: Knowledge of General Education information will improve Description: Including pain rating scale, medication(s)/side effects and non-pharmacologic comfort measures Outcome: Not Progressing   Problem: Health Behavior/Discharge Planning: Goal: Ability to manage health-related needs will improve Outcome: Not Progressing   Problem: Clinical Measurements: Goal: Ability to maintain clinical measurements within normal limits will improve Outcome: Not Progressing Goal: Will remain free from infection Outcome: Not Progressing Goal: Diagnostic test results will improve Outcome: Not Progressing Goal: Respiratory complications will improve Outcome: Not Progressing Goal: Cardiovascular complication will be avoided Outcome: Not Progressing   Problem: Activity: Goal: Risk for activity intolerance will decrease Outcome: Not Progressing   Problem: Nutrition: Goal: Adequate nutrition will be maintained Outcome: Not Progressing   Problem: Coping: Goal: Level of anxiety will decrease Outcome: Not Progressing   Problem: Elimination: Goal: Will not experience complications related to bowel motility Outcome: Not Progressing Goal: Will not experience complications related to urinary retention Outcome: Not Progressing   Problem: Pain Managment: Goal: General experience of comfort will improve and/or be controlled Outcome: Not Progressing   Problem: Safety: Goal: Ability to remain free from injury will improve Outcome: Not Progressing   Problem: Skin Integrity: Goal: Risk for impaired skin integrity will decrease Outcome: Not Progressing   Problem: Activity: Goal: Ability to tolerate increased activity will improve Outcome: Not Progressing   Problem: Clinical Measurements: Goal: Ability to maintain a body temperature in the normal range will improve Outcome: Not Progressing   Problem: Respiratory: Goal: Ability to maintain adequate ventilation will  improve Outcome: Not Progressing Goal: Ability to maintain a clear airway will improve Outcome: Not Progressing   Problem: Education: Goal: Ability to describe self-care measures that may prevent or decrease complications (Diabetes Survival Skills Education) will improve Outcome: Not Progressing Goal: Individualized Educational Video(s) Outcome: Not Progressing   Problem: Coping: Goal: Ability to adjust to condition or change in health will improve Outcome: Not Progressing   Problem: Fluid Volume: Goal: Ability to maintain a balanced intake and output will improve Outcome: Not Progressing   Problem: Health Behavior/Discharge Planning: Goal: Ability to identify and utilize available resources and services will improve Outcome: Not Progressing Goal: Ability to manage health-related needs will improve Outcome: Not Progressing   Problem: Metabolic: Goal: Ability to maintain appropriate glucose levels will improve Outcome: Not Progressing   Problem: Nutritional: Goal: Maintenance of adequate nutrition will improve Outcome: Not Progressing Goal: Progress toward achieving an optimal weight will improve Outcome: Not Progressing   Problem: Skin Integrity: Goal: Risk for impaired skin integrity will decrease Outcome: Not Progressing   Problem: Tissue Perfusion: Goal: Adequacy of tissue perfusion will improve Outcome: Not Progressing   Problem: Activity: Goal: Ability to tolerate increased activity will improve Outcome: Not Progressing   Problem: Respiratory: Goal: Ability to maintain a clear airway and adequate ventilation will improve Outcome: Not Progressing   Problem: Role Relationship: Goal: Method of communication will improve Outcome: Not Progressing

## 2023-11-10 NOTE — Progress Notes (Addendum)
 PROGRESS NOTE    Robert Vega   WUJ:[id number] DOB: [birth year]  DOA: 10/05/2023 Date of Service: 11/11/23 which is hospital day 37  PCP: Robert Llanos, MD    Hospital course / significant events:   HPI: 83 y.o male with significant PMH of OSA, GERD, Obesity, HTN, Dysphagia - presented to the ED 10/05/2023 from with hypoxia, fever and generalized weakness. Dx sepsis/pneumonia, concern for aspiration   Of note, EGD 03/2022 with food in upper esophagus complicated by aspiration event, cardiac arrest with round of CPR, and post resuscitation EGD with concern for lack of peristalsis. Hx rior EGD 08/2020 with note of abnormal cricopharyngeus, decrease in motility in esophagus, and spastic LES who   5/10: Admit to [facility] service with sepsis due to Aspiration Pneumonia.  Course complicated by Acute Respiratory Failure due to Aspiration of vomitus requiring s/p cardiac arrest  transfer to ICU and intubation.  5/11 flexible bronchoscopy done by critical care team 5/12 remains intubated 5/13 remains on vent, s/p PEG tube, +vomiting last night, failed SAT/SBT 5/14 extubated successfully but with severe delirium 5/20.  Botulinum toxin injection into the lower esophageal sphincter by Dr. Ole Vega 5/21 esophagram showing diffusely distended esophagus with distal obstruction and severe dysmotility suggestive of achalasia. 5/22.  Advised I would not give him any food and will continue PEG feeding he can try liquids to see if that goes down but still high risk for aspiration.   5/22: fall  5/23.   Awaiting insurance authorization for rehab. 5/24.  Tolerating some clear luquids\ /25.  Patient pulled out PEG tube this morning.  Foley catheter placed in the stoma.  GI able to place a another PEG tube and remove the Foley. 5/26.  Creatinine up at 1.83.  Continue IV fluid hydration.  Patient started having pain after tube feeding started.  CT scan showed that the PEG tube was not in the correct place.   Notified GI.  Started empiric antibiotics. 5/27.  Patient was taken urgently to the operating room by Dr. Cornel Vega because the patient had fever and elevated white count with suspected intra-abdominal sepsis. 5/28: restart tube feeds  05/30: advancing tube feeds, remains on Zosyn   06/01: on and off AKI 06/04: family working on decision for SNF  06/07: repeat CT abd d/t higher WBC, holding tube feeds but CT nothing acute  06/09: remains stable, pending SNF auth. New melena  06/10: GI repeat EGD, no bleeding  06/11: GI nothing else to add and HH stable GI has s/o, Cr up a bit, increasing free water , granddaughter requests reeval from SLP will ask them to see him tm  06/12: nephrology consult - restart IV fluids and monitor  06/14: cr continues to worsen despite fluids via peg and iv, WBC increasing, not meeting sirs/sepsis but will workup for infection.     06/15: Complicated course:  hospitalist had conversation w/ sons this morning after nephrology had evaluated pt and determined renal failure / not dialysis candidate, recs for deescalate treatment to comfort focused care, hospitalist in agreement. Sons in agreement for DNR, comfort measures only, and d/w hospice - orders placed by hospitalist.  Later in the day, nephrologist spoke w/ granddaughter as courtesy to answer some of her questions - subsequently there was some confusion apparently between medical team and family (granddaughter and pt's sons) re: dialysis, restarting fluids, rescind DNR, resume aggressive treatment. Son is now wanting all treatment, stating grandaughter was told of plan for dialysis etc. I confirmed w/ son and adjusted  orders.  I then spoke w/ Dr Robert Vega re: family wishes, will restart fluids LR 50 cc/h and stop these if respiratory distress. Will reevaluate in AM re: dialysis if he is a candidate for this. Confirmed that no guarantees were made that dialysis was the plan.  I spoke again w/ family around 18:30.  Granddaughter Education officer, community on speaker phone, son Robert Vega in room, pt's RN also present for conversation. Granddaughter states that she was explicitly told by nephrologist that there was a firm plan for dialysis and restart fluids - I discussed w/ her this was not consistent with my conversations with the nephrologist, and he had not documented as such, nor had he placed any orders to indicate this was the plan (which I confirmed with him myself bas above). At any rate, I confirmed with family that the patient has stated re: EOL wished that he would want ANY AND ALL aggressive measures taken even if little to no chance of success, even if painful, including CPR. I made recommendations that CPR/life support are potentially inappropriate given that I believe these measures to be futile, serving only to prolong dying process, and will be potentially harmful/painful without benefit - but if patient would have wanted this then will maintain FULL CODE.  Encouraged family to provide us  with the patient's advanced directive asap even if needing to send photos and I can view these on [name]'s cell phone.        ASSESSMENT & PLAN:   AKI (acute kidney injury) with possible progression to ckd 3b/4 or to renal failure  Fluid overload  baseline creatinine is 1.6 with an EGFR of 40  Per nephrology - suspect acute kidney injury is likely related to multiple bouts of volume depletion. Increased free water  admin via PEG and IV fluids--> no significant improvement in Cr --> nephrology consult --> monitoring few days and adjusting tube feeds / free water  thru tube / IV fluids and creatinine continues to worsen Not a good dialysis candidate long term, discussed w/ son Robert Vega that IF dialysis is initiated this would be on a LIMITED trial, and if no renal recovery after few sessions then would transition to comfort measures / hospice at that time, he is agreeable to this plan in that it aligns with the goal of try everything possible  and if it's not working that at least we have tried.  LR 50 cc/h per nephrology started this afternoon - STOP if respiratory distress  No EPO for now per nephro Resume tube feed  Monitor renal fxn closely Monitor UOP, Foley for strict monitoring   Goals of care  Discussed today w/ patient's sons on the unit, this morning with Robert Vega and his brother, this afternoon with Robert Vega.  Re family communication: given confusion today as noted above, I have agreed with Robert Vega that he will be the primary and only point of contact and we will not discuss anything with [name] unless she is on speaker phone or conference call w/ him present   Re renal fxn: will defer final decision on duialysis to nephrology based on response to fluids etc. I have reviewed the CT images w/ Robert Vega and educated re: renal/cardiovascular physiology and my concerns about fluids as futile measure Re code status: Robert Vega confirms that the patient would want CPR and life support if needed, even if unlikely to be successful. Reasoning is: if or when arrest occurs, if patient does not survive CPR etc, then at least everything has been tried.   Leukocytosis Abn  UA Ceftriaxone  Await culture  Melena - resolved  Anemia chronic disease  Repeat EGD 06/10 no concerns, GI has s/o HH has been stable Follow CBC    Severe sepsis - resolved  Initially w/ aspiration pna Later in hospital stay w/ intra-abdominal infection secondary to PEG tube not being in the right place Completed abx  Achalasia Dysphagia Aspiration risk is high  Status post botulinum toxin injection on 5/20.   Continue tube feeds N.p.o. for the foreseeable future per GI - discussed risk/benefit w/ granddaughter. Advised granddaughter that if she decides for po, she must accept risk for aspiration  SLP to follow  Will need outpatient referral to tertiary advanced GI for consideration of POEMS procedure.  Recommend this only after a few weeks recovery period.     Delirium Agitation/sundowning Granddaughter has asked to discontinue prn antipsychotics  GERD (gastroesophageal reflux disease) Continue PPI   Acute respiratory failure with hypoxia and hypercapnia (HCC) Pneumonia HFpEF Now resolved Monitoring periodic CXR  Caution w/ IV fluids    Essential hypertension, benign Bp wnl off meds Monitor VS     Generalized weakness Therapy as tolerated SNF placement once nephrology clears / pending other clinical developments      Class 1 obesity based on BMI: Body mass index is 32.44 kg/m.Aaron Aas Significantly low or high BMI is associated with higher medical risk.  Underweight - under 18  overweight - 25 to 29 obese - 30 or more Class 1 obesity: BMI of 30.0 to 34 Class 2 obesity: BMI of 35.0 to 39 Class 3 obesity: BMI of 40.0 to 49 Super Morbid Obesity: BMI 50-59 Super-super Morbid Obesity: BMI 60+ Healthy nutrition and physical activity advised as adjunct to other disease management and risk reduction treatments    DVT prophylaxis: none w/ anemia IV fluids: no continuous IV fluids  Nutrition: tube feeds, granddaughter requests SLP eval to reconsider for CLD see above Central lines / other devices: PEG  Code Status: FULL CODE ACP documentation reviewed:  none on file in [facility]  TOC needs: placement Medical barriers to dispo: renal function.               Subjective / Brief ROS:  Patient reports no concerns but is confused today States no pain.    Family Communication: see above    Objective Findings:  Vitals:   11/10/23 1547 11/10/23 1908 11/11/23 0340 11/11/23 0500  BP: 123/65 116/64 119/71   Pulse: 94 94 96   Resp:  20 20   Temp: 98.6 F (37 C) 100 F (37.8 C) 99.3 F (37.4 C)   TempSrc: Oral Axillary Axillary   SpO2: 93% 90% 92%   Weight:    105 kg  Height:        Intake/Output Summary (Last 24 hours) at 11/11/2023 0755 Last data filed at 11/11/2023 0503 Gross per 24 hour  Intake 2147.6 ml  Output  0 ml  Net 2147.6 ml   Filed Weights   11/09/23 0409 11/10/23 0500 11/11/23 0500  Weight: 99.3 kg 102 kg 105 kg    Examination:  Physical Exam Constitutional:      General: He is not in acute distress.    Appearance: He is not ill-appearing.   Cardiovascular:     Rate and Rhythm: Normal rate and regular rhythm.  Pulmonary:     Effort: Pulmonary effort is normal.     Breath sounds: Normal breath sounds.  Abdominal:     Palpations: Abdomen is soft. There is no mass.  Tenderness: There is no abdominal tenderness.   Neurological:     Mental Status: He is alert. Mental status is at baseline. He is disoriented.   Psychiatric:        Mood and Affect: Mood normal.          Scheduled Medications:   Chlorhexidine  Gluconate Cloth  6 each Topical Daily   free water   200 mL Per Tube Q4H   gentamicin  ointment   Topical TID   mouth rinse  15 mL Mouth Rinse 4 times per day   pantoprazole  (PROTONIX ) IV  40 mg Intravenous Q12H   sodium zirconium cyclosilicate  5 g Per Tube Daily    Continuous Infusions:  cefTRIAXone  (ROCEPHIN )  IV 1 g (11/10/23 1814)   feeding supplement (OSMOLITE 1.5 CAL) 65 mL/hr at 11/10/23 1800    PRN Medications:  acetaminophen  **OR** acetaminophen , artificial tears, bisacodyl , diphenhydrAMINE, ipratropium-albuterol , [DISCONTINUED] ondansetron  **OR** ondansetron  (ZOFRAN ) IV, mouth rinse, mouth rinse, ziprasidone  Antimicrobials from admission:  Anti-infectives (From admission, onward)    Start     Dose/Rate Route Frequency Ordered Stop   11/10/23 1715  cefTRIAXone  (ROCEPHIN ) 1 g in sodium chloride  0.9 % 100 mL IVPB        1 g 200 mL/hr over 30 Minutes Intravenous Every 24 hours 11/10/23 1623     10/21/23 2200  Ampicillin -Sulbactam (UNASYN ) 3 g in sodium chloride  0.9 % 100 mL IVPB  Status:  Discontinued        3 g 200 mL/hr over 30 Minutes Intravenous Every 6 hours 10/21/23 2011 10/21/23 2014   10/21/23 2200  piperacillin -tazobactam (ZOSYN ) IVPB  3.375 g        3.375 g 12.5 mL/hr over 240 Minutes Intravenous Every 8 hours 10/21/23 2019 10/26/23 2359   10/06/23 1600  cefTRIAXone  (ROCEPHIN ) 2 g in sodium chloride  0.9 % 100 mL IVPB        2 g 200 mL/hr over 30 Minutes Intravenous Every 24 hours 10/06/23 1415 10/09/23 1749   10/06/23 0600  piperacillin -tazobactam (ZOSYN ) IVPB 3.375 g  Status:  Discontinued        3.375 g 12.5 mL/hr over 240 Minutes Intravenous Every 8 hours 10/06/23 0356 10/06/23 1415   10/06/23 0500  cefTRIAXone  (ROCEPHIN ) 2 g in sodium chloride  0.9 % 100 mL IVPB  Status:  Discontinued        2 g 200 mL/hr over 30 Minutes Intravenous Every 24 hours 10/05/23 0750 10/06/23 0341   10/06/23 0500  azithromycin  (ZITHROMAX ) 500 mg in sodium chloride  0.9 % 250 mL IVPB  Status:  Discontinued        500 mg 250 mL/hr over 60 Minutes Intravenous Every 24 hours 10/05/23 0750 10/06/23 1409   10/05/23 0615  metroNIDAZOLE  (FLAGYL ) IVPB 500 mg        500 mg 100 mL/hr over 60 Minutes Intravenous  Once 10/05/23 0611 10/05/23 0910   10/05/23 0545  cefTRIAXone  (ROCEPHIN ) 2 g in sodium chloride  0.9 % 100 mL IVPB        2 g 200 mL/hr over 30 Minutes Intravenous Once 10/05/23 0533 10/05/23 0650   10/05/23 0545  azithromycin  (ZITHROMAX ) 500 mg in sodium chloride  0.9 % 250 mL IVPB        500 mg 250 mL/hr over 60 Minutes Intravenous  Once 10/05/23 0533 10/05/23 0805           Data Reviewed:  I have personally reviewed the following...  CBC: Recent Labs  Lab 11/07/23 0446 11/08/23 0529 11/09/23 0404  11/10/23 1544 11/11/23 0403  WBC 12.8* 13.4* 15.4* 17.4* 17.1*  HGB 8.4* 8.5* 8.3* 8.4* 8.0*  HCT 26.5* 26.5* 25.8* 25.7* 24.2*  MCV 94.6 95.7 95.2 92.1 91.3  PLT 360 340 346 365 374   Basic Metabolic Panel: Recent Labs  Lab 11/08/23 0529 11/09/23 0404 11/10/23 0537 11/10/23 1544 11/11/23 0403  NA 145 145 135 133* 136  K 4.6 4.6 5.4* 5.9* 6.3*  CL 110 110 101 101 101  CO2 26 25 24 22  21*  GLUCOSE 140* 118* 144* 106*  130*  BUN 66* 60* 85* 101* 107*  CREATININE 2.69* 2.73* 4.90* 5.72* 6.48*  CALCIUM 7.7* 7.7* 7.3* 7.3* 7.4*  MG  --  2.5*  --   --   --   PHOS  --  4.7*  --   --   --    GFR: Estimated Creatinine Clearance: 10.1 mL/min (A) (by C-G formula based on SCr of 6.48 mg/dL (H)). Liver Function Tests: Recent Labs  Lab 11/10/23 1544  AST 73*  ALT 84*  ALKPHOS 92  BILITOT 0.8  PROT 6.4*  ALBUMIN 1.9*   No results for input(s): LIPASE, AMYLASE in the last 168 hours. No results for input(s): AMMONIA in the last 168 hours. Coagulation Profile: No results for input(s): INR, PROTIME in the last 168 hours. Cardiac Enzymes: No results for input(s): CKTOTAL, CKMB, CKMBINDEX, TROPONINI in the last 168 hours. BNP (last 3 results) No results for input(s): PROBNP in the last 8760 hours. HbA1C: No results for input(s): HGBA1C in the last 72 hours. CBG: Recent Labs  Lab 11/10/23 0830 11/10/23 1637 11/10/23 1945 11/10/23 2333 11/11/23 0336  GLUCAP 138* 105* 115* 139* 137*   Lipid Profile: No results for input(s): CHOL, HDL, LDLCALC, TRIG, CHOLHDL, LDLDIRECT in the last 72 hours. Thyroid  Function Tests: No results for input(s): TSH, T4TOTAL, FREET4, T3FREE, THYROIDAB in the last 72 hours. Anemia Panel: No results for input(s): VITAMINB12, FOLATE, FERRITIN, TIBC, IRON, RETICCTPCT in the last 72 hours.  Most Recent Urinalysis On File:     Component Value Date/Time   COLORURINE YELLOW (A) 11/10/2023 0158   APPEARANCEUR HAZY (A) 11/10/2023 0158   LABSPEC 1.010 11/10/2023 0158   PHURINE 6.0 11/10/2023 0158   GLUCOSEU NEGATIVE 11/10/2023 0158   HGBUR NEGATIVE 11/10/2023 0158   BILIRUBINUR NEGATIVE 11/10/2023 0158   KETONESUR NEGATIVE 11/10/2023 0158   PROTEINUR 100 (A) 11/10/2023 0158   NITRITE NEGATIVE 11/10/2023 0158   LEUKOCYTESUR TRACE (A) 11/10/2023 0158   Sepsis  Labs: @LABRCNTIP (procalcitonin:4,lacticidven:4) Microbiology: Recent Results (from the past 240 hours)  Resp panel by RT-PCR (RSV, Flu A&B, Covid) Anterior Nasal Swab     Status: None   Collection Time: 11/09/23  5:31 PM   Specimen: Anterior Nasal Swab  Result Value Ref Range Status   SARS Coronavirus 2 by RT PCR NEGATIVE NEGATIVE Final    Comment: (NOTE) SARS-CoV-2 target nucleic acids are NOT DETECTED.  The SARS-CoV-2 RNA is generally detectable in upper respiratory specimens during the acute phase of infection. The lowest concentration of SARS-CoV-2 viral copies this assay can detect is 138 copies/mL. A negative result does not preclude SARS-Cov-2 infection and should not be used as the sole basis for treatment or other patient management decisions. A negative result may occur with  improper specimen collection/handling, submission of specimen other than nasopharyngeal swab, presence of viral mutation(s) within the areas targeted by this assay, and inadequate number of viral copies(<138 copies/mL). A negative result must be combined with clinical observations, patient  history, and epidemiological information. The expected result is Negative.  Fact Sheet for Patients:  BloggerCourse.com  Fact Sheet for Healthcare Providers:  SeriousBroker.it  This test is no t yet approved or cleared by the [place]  FDA and  has been authorized for detection and/or diagnosis of SARS-CoV-2 by FDA under an Emergency Use Authorization (EUA). This EUA will remain  in effect (meaning this test can be used) for the duration of the COVID-19 declaration under Section 564(b)(1) of the Act, 21 U.S.C.section 360bbb-3(b)(1), unless the authorization is terminated  or revoked sooner.       Influenza A by PCR NEGATIVE NEGATIVE Final   Influenza B by PCR NEGATIVE NEGATIVE Final    Comment: (NOTE) The Xpert Xpress SARS-CoV-2/FLU/RSV plus assay is  intended as an aid in the diagnosis of influenza from Nasopharyngeal swab specimens and should not be used as a sole basis for treatment. Nasal washings and aspirates are unacceptable for Xpert Xpress SARS-CoV-2/FLU/RSV testing.  Fact Sheet for Patients: BloggerCourse.com  Fact Sheet for Healthcare Providers: SeriousBroker.it  This test is not yet approved or cleared by the [place]  FDA and has been authorized for detection and/or diagnosis of SARS-CoV-2 by FDA under an Emergency Use Authorization (EUA). This EUA will remain in effect (meaning this test can be used) for the duration of the COVID-19 declaration under Section 564(b)(1) of the Act, 21 U.S.C. section 360bbb-3(b)(1), unless the authorization is terminated or revoked.     Resp Syncytial Virus by PCR NEGATIVE NEGATIVE Final    Comment: (NOTE) Fact Sheet for Patients: BloggerCourse.com  Fact Sheet for Healthcare Providers: SeriousBroker.it  This test is not yet approved or cleared by the [place]  FDA and has been authorized for detection and/or diagnosis of SARS-CoV-2 by FDA under an Emergency Use Authorization (EUA). This EUA will remain in effect (meaning this test can be used) for the duration of the COVID-19 declaration under Section 564(b)(1) of the Act, 21 U.S.C. section 360bbb-3(b)(1), unless the authorization is terminated or revoked.  Performed at [facility], [address] [place], [place] [address]   Respiratory (~20 pathogens) panel by PCR     Status: None   Collection Time: 11/09/23  5:31 PM   Specimen: Nasopharyngeal Swab; Respiratory  Result Value Ref Range Status   Adenovirus NOT DETECTED NOT DETECTED Final   Coronavirus 229E NOT DETECTED NOT DETECTED Final    Comment: (NOTE) The Coronavirus on the Respiratory Panel, DOES NOT test for the novel  Coronavirus (2019 nCoV)     Coronavirus HKU1 NOT DETECTED NOT DETECTED Final   Coronavirus NL63 NOT DETECTED NOT DETECTED Final   Coronavirus OC43 NOT DETECTED NOT DETECTED Final   Metapneumovirus NOT DETECTED NOT DETECTED Final   Rhinovirus / Enterovirus NOT DETECTED NOT DETECTED Final   Influenza A NOT DETECTED NOT DETECTED Final   Influenza B NOT DETECTED NOT DETECTED Final   Parainfluenza Virus 1 NOT DETECTED NOT DETECTED Final   Parainfluenza Virus 2 NOT DETECTED NOT DETECTED Final   Parainfluenza Virus 3 NOT DETECTED NOT DETECTED Final   Parainfluenza Virus 4 NOT DETECTED NOT DETECTED Final   Respiratory Syncytial Virus NOT DETECTED NOT DETECTED Final   Bordetella pertussis NOT DETECTED NOT DETECTED Final   Bordetella Parapertussis NOT DETECTED NOT DETECTED Final   Chlamydophila pneumoniae NOT DETECTED NOT DETECTED Final   Mycoplasma pneumoniae NOT DETECTED NOT DETECTED Final    Comment: Performed at [facility], [address] [place], [place] [address]  Culture, blood (  Routine X 2) w Reflex to ID Panel     Status: None (Preliminary result)   Collection Time: 11/09/23  6:16 PM   Specimen: BLOOD  Result Value Ref Range Status   Specimen Description BLOOD RIGHT ANTECUBITAL  Final   Special Requests   Final    BOTTLES DRAWN AEROBIC ONLY Blood Culture adequate volume   Culture   Final    NO GROWTH 2 DAYS Performed at [facility], [address], [place], [place] [address]    Report Status PENDING  Incomplete  Culture, blood (Routine X 2) w Reflex to ID Panel     Status: None (Preliminary result)   Collection Time: 11/09/23  6:23 PM   Specimen: BLOOD  Result Value Ref Range Status   Specimen Description BLOOD BLOOD LEFT FOREARM  Final   Special Requests   Final    BOTTLES DRAWN AEROBIC AND ANAEROBIC Blood Culture adequate volume   Culture   Final    NO GROWTH 2 DAYS Performed at [facility], [address] [place], [place] [address]    Report Status PENDING  Incomplete       Radiology Studies last 3 days: CT ABDOMEN PELVIS WO CONTRAST Result Date: 11/10/2023 CLINICAL DATA:  Gastrostomy, abdominal distension. EXAM: CT ABDOMEN AND PELVIS WITHOUT CONTRAST TECHNIQUE: Multidetector CT imaging of the abdomen and pelvis was performed following the standard protocol without IV contrast. RADIATION DOSE REDUCTION: This exam was performed according to the departmental dose-optimization program which includes automated exposure control, adjustment of the mA and/or kV according to patient size and/or use of iterative reconstruction technique. COMPARISON:  11/02/2023. FINDINGS: Lower chest: Right-sided small pleural effusion and right base consolidation or volume loss. Minimal subsegmental atelectasis left base. Atheromatous calcifications of coronary arteries. No pericardial effusion. Decompressed moderate-sized paraesophageal hiatal hernia. Hepatobiliary: No hepatic parenchymal abnormalities. Small amount of milk of calcium or tiny calcified stones in the gallbladder. Gallbladder is contracted. Pancreas: Unremarkable. No pancreatic ductal dilatation or surrounding inflammatory changes. Spleen: Normal in size without focal abnormality. Adrenals/Urinary Tract: No adrenal lesions. Bilateral hydronephrosis and hydroureter without evidence of stones. Urinary bladder is empty. Stomach/Bowel: Gastrostomy in the stomach. No bowel dilatation to suggest obstruction. Normal appendix. Vascular/Lymphatic: Aortic atherosclerosis. No enlarged abdominal or pelvic lymph nodes. Reproductive: Prostate is unremarkable. Other: No abdominal wall defects. There is fluid in the pararenal space as well as in the paracolic gutters. Edema or inflammatory changes represent an interval change from the prior study extending along the pericolic gutters bilaterally as well as in the lower abdominal retroperitoneum. Musculoskeletal: No acute or significant osseous findings. IMPRESSION: 1. Right-sided pleural effusion and  right base consolidation or volume loss. 2. Bilateral hydronephrosis and hydroureter without evidence of stones. 3. Fluid in the pararenal space and paracolic gutters. Inflammatory edematous changes extend into the pelvis in the presacral region. 4. Moderate-sized paraesophageal hiatal hernia. Gastrostomy appropriately positioned in the stomach. 5. Aortic atherosclerosis (ICD10-I70.0). Electronically Signed   By: Sydell Eva M.D.   On: 11/10/2023 10:14   DG Abd 1 View Result Date: 11/10/2023 EXAM: 1 VIEW XRAY OF THE ABDOMEN SUPINE 11/10/2023 12:08:49 AM COMPARISON: CT abdomen/pelvis dated 12/02/2023. CLINICAL HISTORY: Abdominal distension, constipation. FINDINGS: BOWEL: Nonobstructive bowel gas pattern. Residual contrast in the distal colon with suspected sigmoid diverticulosis. PERITONEUM AND SOFT TISSUES: Suspected gastrostomy overlying the stomach. BONES: Mild degenerative changes of the right hip. No acute osseous abnormality. IMPRESSION: 1. No acute findings. 2. Suspected gastrostomy overlying the stomach. Electronically signed by: Sriyesh Krishnan  MD 11/10/2023 12:15 AM EDT RP Workstation: [id number]   DG Chest Port 1 View Result Date: 11/08/2023 CLINICAL DATA:  Congestive heart failure EXAM: PORTABLE CHEST 1 VIEW COMPARISON:  Chest radiograph dated 10/29/2023 FINDINGS: Patient is rotated to the right. Low lung volumes with bronchovascular crowding. Increased diffuse interstitial opacities. Similar trace bilateral pleural effusions. No pneumothorax. Similar enlarged cardiomediastinal silhouette. No acute osseous abnormality. IMPRESSION: 1. Increased diffuse interstitial opacities, likely pulmonary edema. 2. Similar trace bilateral pleural effusions. Electronically Signed   By: Limin  Xu M.D.   On: 11/08/2023 13:18     Time spent: 90 minutes    Luciann Gossett, DO [facility] 11/11/2023, 7:55 AM    Dictation software may have been used to generate the above note. Typos may occur  and escape review in typed/dictated notes. Please contact Dr Authur Leghorn directly for clarity if needed.  Staff may message me via secure chat in Epic  but this may not receive an immediate response,  please page me for urgent matters!  If 7PM-7AM, please contact night coverage [email or web address]

## 2023-11-11 ENCOUNTER — Inpatient Hospital Stay

## 2023-11-11 ENCOUNTER — Encounter: Payer: Self-pay | Admitting: Vascular Surgery

## 2023-11-11 ENCOUNTER — Encounter
Admission: EM | Discharge status: Against Medical Advice | Payer: Self-pay | Source: Home / Self Care | Attending: Internal Medicine

## 2023-11-11 DIAGNOSIS — N19 Unspecified kidney failure: Secondary | ICD-10-CM | POA: Diagnosis not present

## 2023-11-11 DIAGNOSIS — A419 Sepsis, unspecified organism: Secondary | ICD-10-CM | POA: Diagnosis not present

## 2023-11-11 DIAGNOSIS — R6520 Severe sepsis without septic shock: Secondary | ICD-10-CM | POA: Diagnosis not present

## 2023-11-11 DIAGNOSIS — N179 Acute kidney failure, unspecified: Secondary | ICD-10-CM | POA: Diagnosis not present

## 2023-11-11 DIAGNOSIS — Z4901 Encounter for fitting and adjustment of extracorporeal dialysis catheter: Secondary | ICD-10-CM | POA: Diagnosis not present

## 2023-11-11 HISTORY — PX: TEMPORARY DIALYSIS CATHETER: CATH118312

## 2023-11-11 LAB — BASIC METABOLIC PANEL WITH GFR
Anion gap: 14 (ref 5–15)
Anion gap: 15 (ref 5–15)
BUN: 107 mg/dL — ABNORMAL HIGH (ref 8–23)
BUN: 115 mg/dL — ABNORMAL HIGH (ref 8–23)
CO2: 21 mmol/L — ABNORMAL LOW (ref 22–32)
CO2: 19 mmol/L — ABNORMAL LOW (ref 22–32)
Calcium: 7.4 mg/dL — ABNORMAL LOW (ref 8.9–10.3)
Calcium: 7.7 mg/dL — ABNORMAL LOW (ref 8.9–10.3)
Chloride: 101 mmol/L (ref 98–111)
Chloride: 100 mmol/L (ref 98–111)
Creatinine, Ser: 6.48 mg/dL — ABNORMAL HIGH (ref 0.61–1.24)
Creatinine, Ser: 7.67 mg/dL — ABNORMAL HIGH (ref 0.61–1.24)
GFR, Estimated: 8 mL/min — ABNORMAL LOW (ref >60)
GFR, Estimated: 6 mL/min — ABNORMAL LOW (ref >60)
Glucose, Bld: 130 mg/dL — ABNORMAL HIGH (ref 70–99)
Glucose, Bld: 116 mg/dL — ABNORMAL HIGH (ref 70–99)
Potassium: 6.3 mmol/L (ref 3.5–5.1)
Potassium: 7 mmol/L (ref 3.5–5.1)
Sodium: 136 mmol/L (ref 135–145)
Sodium: 134 mmol/L — ABNORMAL LOW (ref 135–145)

## 2023-11-11 LAB — CBC
HCT: 24.2 % — ABNORMAL LOW (ref 39.0–52.0)
Hemoglobin: 8 g/dL — ABNORMAL LOW (ref 13.0–17.0)
MCH: 30.2 pg (ref 26.0–34.0)
MCHC: 33.1 g/dL (ref 30.0–36.0)
MCV: 91.3 fL (ref 80.0–100.0)
Platelets: 374 10*3/uL (ref 150–400)
RBC: 2.65 MIL/uL — ABNORMAL LOW (ref 4.22–5.81)
RDW: 14.1 % (ref 11.5–15.5)
WBC: 17.1 10*3/uL — ABNORMAL HIGH (ref 4.0–10.5)
nRBC: 0.1 % (ref 0.0–0.2)

## 2023-11-11 LAB — BLOOD GAS, ARTERIAL
Acid-base deficit: 3.3 mmol/L — ABNORMAL HIGH (ref 0.0–2.0)
Bicarbonate: 19.9 mmol/L — ABNORMAL LOW (ref 20.0–28.0)
FIO2: 0.24 %
O2 Saturation: 99.8 %
Patient temperature: 37
pCO2 arterial: 28 mmHg — ABNORMAL LOW (ref 32–48)
pH, Arterial: 7.46 — ABNORMAL HIGH (ref 7.35–7.45)
pO2, Arterial: 114 mmHg — ABNORMAL HIGH (ref 83–108)

## 2023-11-11 LAB — GLUCOSE, CAPILLARY
Glucose-Capillary: 137 mg/dL — ABNORMAL HIGH (ref 70–99)
Glucose-Capillary: 132 mg/dL — ABNORMAL HIGH (ref 70–99)
Glucose-Capillary: 102 mg/dL — ABNORMAL HIGH (ref 70–99)
Glucose-Capillary: 107 mg/dL — ABNORMAL HIGH (ref 70–99)
Glucose-Capillary: 107 mg/dL — ABNORMAL HIGH (ref 70–99)

## 2023-11-11 MED ORDER — INSULIN ASPART 100 UNIT/ML IJ SOLN: 10.0000 [IU] | Freq: Once | INTRAMUSCULAR | Status: DC

## 2023-11-11 MED ORDER — DEXTROSE 50 % IV SOLN
1.0000 | Freq: Once | INTRAVENOUS | Status: AC
  Administered 2023-11-11: 50 mL via INTRAVENOUS
  Filled 2023-11-11: qty 50

## 2023-11-11 MED ORDER — CALCIUM GLUCONATE-NACL 1-0.675 GM/50ML-% IV SOLN
1.0000 g | Freq: Once | INTRAVENOUS | Status: AC
  Administered 2023-11-11: 1000 mg via INTRAVENOUS
  Filled 2023-11-11 (×2): qty 50

## 2023-11-11 MED ORDER — HEPARIN SODIUM (PORCINE) 1000 UNIT/ML IJ SOLN
INTRAMUSCULAR | Status: AC
Start: 2023-11-11 — End: 2023-11-11
  Filled 2023-11-11: qty 10

## 2023-11-11 MED ORDER — SODIUM CHLORIDE 0.9 % IV SOLN: INTRAVENOUS | Status: DC

## 2023-11-11 MED ORDER — CHLORHEXIDINE GLUCONATE CLOTH 2 % EX PADS
6.0000 | MEDICATED_PAD | Freq: Every day | CUTANEOUS | Status: DC
  Administered 2023-11-12 – 2023-11-17 (×6): 6 via TOPICAL

## 2023-11-11 MED ORDER — SODIUM CHLORIDE 0.9 % IV SOLN
3.0000 g | Freq: Two times a day (BID) | INTRAVENOUS | Status: DC
  Administered 2023-11-11 – 2023-11-14 (×7): 3 g via INTRAVENOUS
  Filled 2023-11-11 (×10): qty 8

## 2023-11-11 MED ORDER — LIDOCAINE HCL (PF) 1 % IJ SOLN
INTRAMUSCULAR | Status: DC | PRN
  Administered 2023-11-11: 5 mL via INTRADERMAL

## 2023-11-11 MED ORDER — HEPARIN SODIUM (PORCINE) 1000 UNIT/ML DIALYSIS
1000.0000 [IU] | INTRAMUSCULAR | Status: DC | PRN
  Administered 2023-11-11: 3600 [IU]

## 2023-11-11 SURGICAL SUPPLY — 2 items
COVER PROBE ULTRASOUND 5X96 (MISCELLANEOUS) IMPLANT
KIT DIALYSIS CATH TRI 30X13 (CATHETERS) IMPLANT

## 2023-11-11 NOTE — Plan of Care (Signed)

## 2023-11-11 NOTE — Consult Note (Signed)
 Hospital Consult    Reason for Consult:  Renal failure with need for dialysis access.  Requesting Physician:  Dr. Gari Junior MD  MRN #:  [id number]  History of Present Illness: This is a 83 y.o. male with significant PMH of OSA, GERD, Obesity, HTN, Dysphagia - presented to the ED 10/05/2023 from with hypoxia, fever and generalized weakness. Dx sepsis/pneumonia, concern for aspiration. Of note, EGD 03/2022 with food in upper esophagus complicated by aspiration event, cardiac arrest with round of CPR, and post resuscitation EGD with concern for lack of peristalsis. On 10/05/23 patient was admitted to [facility] for severe sepsis due to aspiration pneumonia.  On 11/09/2023 patient's creatinine continued to worsen despite fluids via PEG and IV.  Today's BUN is 107 and his creatinine is 6.48.  Nephrology consulted vascular surgery for temporary dialysis access placement today to start hemodialysis.  Past Medical History:  Diagnosis Date   Allergy    Arthritis    BPH (benign prostatic hypertrophy)    Colonic polyp    GERD (gastroesophageal reflux disease)    Has LPR   Hypertension     Past Surgical History:  Procedure Laterality Date   CATARACT EXTRACTION     2011, other in 2013   COLONOSCOPY  2011   CREATION, GASTROSTOMY, OPEN N/A 10/22/2023   Procedure: CREATION, GASTROSTOMY, OPEN; GASTROSTOMY CLOSURE;  Surgeon: Barrett Lick, MD;  Location: [facility] ORS;  Service: General;  Laterality: N/A;   ESOPHAGOGASTRODUODENOSCOPY N/A 10/15/2023   Procedure: EGD (ESOPHAGOGASTRODUODENOSCOPY);  Surgeon: Marnee Sink, MD;  Location: [facility] ENDOSCOPY;  Service: Endoscopy;  Laterality: N/A;  WILL NEED BOTOX    ESOPHAGOGASTRODUODENOSCOPY N/A 11/05/2023   Procedure: EGD (ESOPHAGOGASTRODUODENOSCOPY);  Surgeon: Marnee Sink, MD;  Location: [facility] ENDOSCOPY;  Service: Endoscopy;  Laterality: N/A;   ESOPHAGOGASTRODUODENOSCOPY (EGD) WITH PROPOFOL  N/A 08/30/2020   Procedure: ESOPHAGOGASTRODUODENOSCOPY (EGD) WITH PROPOFOL ;   Surgeon: Luke Salaam, MD;  Location: [facility] ENDOSCOPY;  Service: Gastroenterology;  Laterality: N/A;   ESOPHAGOGASTRODUODENOSCOPY (EGD) WITH PROPOFOL  N/A 04/02/2022   Procedure: ESOPHAGOGASTRODUODENOSCOPY (EGD) WITH PROPOFOL ;  Surgeon: Luke Salaam, MD;  Location: [facility] ENDOSCOPY;  Service: Gastroenterology;  Laterality: N/A;   LAPAROTOMY N/A 10/22/2023   Procedure: LAPAROTOMY, EXPLORATORY;  Surgeon: Barrett Lick, MD;  Location: [facility] ORS;  Service: General;  Laterality: N/A;   PEG PLACEMENT N/A 10/08/2023   Procedure: INSERTION, PEG TUBE;  Surgeon: Marnee Sink, MD;  Location: [facility] ENDOSCOPY;  Service: Endoscopy;  Laterality: N/A;   skin cancer removal      Allergies  Allergen Reactions   Sulfonamide Derivatives     Prior to Admission medications   Medication Sig Start Date End Date Taking? Authorizing Provider  amLODipine  (NORVASC ) 2.5 MG tablet Take 1 tablet by mouth daily. 09/05/22  Yes [provider]  fluticasone (FLONASE) 50 MCG/ACT nasal spray Place 2 sprays into both nostrils daily. 09/11/23  Yes [provider]  acetaminophen  (TYLENOL ) 650 MG CR tablet Take 650 mg by mouth every morning.    [provider]  aluminum hydroxide-magnesium carbonate (GAVISCON) 95-358 MG/15ML SUSP Take by mouth.    [provider]  aspirin  81 MG tablet Take 81 mg by mouth daily.    [provider]  diltiazem  (CARDIZEM  CD) 120 MG 24 hr capsule Take 120 mg by mouth daily. 07/29/23 07/28/24  [provider]  docusate sodium  (COLACE) 50 MG capsule Take 50 mg by mouth daily as needed for moderate constipation or mild constipation.    [provider]  GLUCOSAMINE-CHONDROITIN-MSM PO Take by mouth.  [provider]  lansoprazole  (PREVACID ) 30 MG capsule TAKE ONE CAPSULE BY MOUTH EVERY NIGHT AT BEDTIME 02/25/23   Helaine Llanos, MD  Multiple Vitamins-Minerals (MULTIVITAMIN WITH MINERALS) tablet Take 1 tablet by mouth daily.    [provider]  tadalafil  (CIALIS ) 20 MG tablet TAKE 1/2 TO 1 TABLET BY MOUTH EVERY OTHER DAY AS NEEDED FOR ERECTILE DYSFUNCTION 09/20/22   Helaine Llanos, MD    Social History   Socioeconomic History   Marital status: Divorced    Spouse name: Not on file   Number of children: 2   Years of education: Not on file   Highest education level: Not on file  Occupational History   Occupation: retired--car dealer/sales  Tobacco Use   Smoking status: Former    Current packs/day: 0.00    Average packs/day: 2.0 packs/day for 50.0 years (100.0 ttl pk-yrs)    Types: Pipe, Cigarettes    Start date: 10/19/1964    Quit date: 10/20/2014    Years since quitting: 9.0    Passive exposure: Past   Smokeless tobacco: Never   Tobacco comments:    pipe   Vaping Use   Vaping status: Never Used  Substance and Sexual Activity   Alcohol use: No    Alcohol/week: 0.0 standard drinks of alcohol   Drug use: No   Sexual activity: Not on file  Other Topics Concern   Not on file  Social History Narrative   Has living will   Two sons and granddaughter are health care POAs (GD is first ---is DPT)   Would accept resuscitation and tube feedings if appropriate   Social Drivers of Health   Financial Resource Strain: Medium Risk (09/11/2023)   Received from [facility]   Overall Financial Resource Strain (CARDIA)    Difficulty of Paying Living Expenses: Somewhat hard  Food Insecurity: Patient Unable To Answer (10/06/2023)   Hunger Vital Sign    Worried About Running Out of Food in the Last Year: Patient unable to answer    Ran Out of Food in the Last Year: Patient unable to answer  Recent Concern: Food Insecurity - Food Insecurity Present (09/11/2023)   Received from [facility]   Hunger Vital Sign    Within the past 12 months, you worried that your food would run out before you got the money to buy more.: Sometimes true    Within the past 12 months, the food you bought  just didn't last and you didn't have money to get more.: Never true  Transportation Needs: Patient Unable To Answer (10/06/2023)   PRAPARE - Transportation    Lack of Transportation (Medical): Patient unable to answer    Lack of Transportation (Non-Medical): Patient unable to answer  Physical Activity: Not on file  Stress: Not on file  Social Connections: Unknown (10/06/2023)   Social Connection and Isolation Panel    Frequency of Communication with Friends and Family: Patient unable to answer    Frequency of Social Gatherings with Friends and Family: Patient unable to answer    Attends Religious Services: Patient unable to answer    Active Member of Clubs or Organizations: Patient unable to answer    Attends Banker Meetings: Patient unable to answer    Marital Status: Not on file  Intimate Partner Violence: Patient Unable To Answer (10/06/2023)   Humiliation, Afraid, Rape, and Kick questionnaire    Fear of Current or Ex-Partner: Patient unable to answer  Emotionally Abused: Patient unable to answer    Physically Abused: Patient unable to answer    Sexually Abused: Patient unable to answer     Family History  Problem Relation Age of Onset   Hypertension Mother    Heart disease Neg Hx    Diabetes Neg Hx    Cancer Neg Hx     ROS: Otherwise negative unless mentioned in HPI  Physical Examination  Vitals:   11/10/23 1908 11/11/23 0340  BP: 116/64 119/71  Pulse: 94 96  Resp: 20 20  Temp: 100 F (37.8 C) 99.3 F (37.4 C)  SpO2: 90% 92%   Body mass index is 34.17 kg/m.  General:  WDWN in NAD Gait: Not observed HENT: WNL, normocephalic Pulmonary: normal non-labored breathing, without Rales, rhonchi,  wheezing Cardiac: regular, without  Murmurs, rubs or gallops; without carotid bruits Abdomen: Positive bowel sounds on auscultation, soft, NT/Positive distention , no masses Skin: without rashes Vascular Exam/Pulses: Palpable pulses throughout all extremities  are warm to touch.  Extremities: without ischemic changes, without Gangrene , without cellulitis; without open wounds;  Musculoskeletal: no muscle wasting or atrophy  Neurologic: A&O X 3;  No focal weakness or paresthesias are detected; speech is fluent/normal Psychiatric:  The pt has Normal affect. Lymph:  Unremarkable  CBC    Component Value Date/Time   WBC 17.1 (H) 11/11/2023 0403   RBC 2.65 (L) 11/11/2023 0403   HGB 8.0 (L) 11/11/2023 0403   HCT 24.2 (L) 11/11/2023 0403   PLT 374 11/11/2023 0403   MCV 91.3 11/11/2023 0403   MCH 30.2 11/11/2023 0403   MCHC 33.1 11/11/2023 0403   RDW 14.1 11/11/2023 0403   LYMPHSABS 1.6 10/31/2023 0218   MONOABS 2.3 (H) 10/31/2023 0218   EOSABS 0.1 10/31/2023 0218   BASOSABS 0.1 10/31/2023 0218    BMET    Component Value Date/Time   NA 136 11/11/2023 0403   K 6.3 (HH) 11/11/2023 0403   CL 101 11/11/2023 0403   CO2 21 (L) 11/11/2023 0403   GLUCOSE 130 (H) 11/11/2023 0403   BUN 107 (H) 11/11/2023 0403   CREATININE 6.48 (H) 11/11/2023 0403   CALCIUM 7.4 (L) 11/11/2023 0403   GFRNONAA 8 (L) 11/11/2023 0403   GFRAA 108 04/17/2007 0000    COAGS: Lab Results  Component Value Date   INR 1.2 10/22/2023   INR 1.1 10/05/2023     Non-Invasive Vascular Imaging:   None Ordered  Statin:  No. Beta Blocker:  No. Aspirin :  No. ACEI:  No. ARB:  Yes.   CCB use:  Yes Other antiplatelets/anticoagulants:  No.    ASSESSMENT/PLAN: This is a 83 y.o. male who presents to [facility] emergency department with generalized weakness and fever back on 10/05/2023.  Progressively over the last month patient has declined and recently his acute kidney injury has worsened with elevated creatinine and BUN.  Vascular surgery was consulted by nephrology for temporary dialysis catheter start hemodialysis today.  Vascular surgery plans on taking the patient to the vascular lab on 11/11/2023 for temporary dialysis catheter placement.  I discussed in detail at the bedside  with the patient the procedure, benefits, risk, and complications.  Patient verbalized understanding wishes to proceed.  Patient does not need to be n.p.o. for this procedure today.  I had a long detailed discussion with Dr. Lamount Pimple concerning this case of the patient today.  He has already spoken with the patient and the family members regarding the start of hemodialysis and the temporary catheter.   -  I discussed the case with Dr. Mikki Alexander MD and he agrees with plan.   Annamaria Barrette [facility] 11/11/2023 10:42 AM

## 2023-11-11 NOTE — Op Note (Signed)
  OPERATIVE NOTE   PROCEDURE: Ultrasound guidance for vascular access right femoral vein Placement of a 30 cm triple lumen dialysis catheter right femoral vein  PRE-OPERATIVE DIAGNOSIS: 1. Renal failure   POST-OPERATIVE DIAGNOSIS: Same  SURGEON: Mikki Alexander, MD  ASSISTANT(S): None  ANESTHESIA: local  ESTIMATED BLOOD LOSS: Minimal   FINDING(S): 1.  None  SPECIMEN(S):  None  INDICATIONS:    Patient is a 83 y.o.male who presents with renal failure and needs a dialysis catheter for immediate dialysis.  Risks and benefits were discussed, and informed consent was obtained..  DESCRIPTION: After obtaining full informed written consent, the patient was laid flat in the bed.  The right groin was sterilely prepped and draped in a sterile surgical field was created. The right femoral vein was visualized with ultrasound and found to be widely patent. It was then accessed under direct guidance without difficulty with a Seldinger needle and a permanent image was recorded. A J-wire was then placed. After skin nick and dilatation, a 30 cm triple lumen dialysis catheter was placed over the wire and the wire was removed. The lumens withdrew dark red nonpulsatile blood and flushed easily with sterile saline. The catheter was secured to the skin with 3 nylon sutures. Sterile dressing was placed.  COMPLICATIONS: None  CONDITION: Stable  Mikki Alexander 11/11/2023 12:56 PM  This note was created with Dragon Medical transcription system. Any errors in dictation are purely unintentional.

## 2023-11-11 NOTE — Progress Notes (Signed)
 PROGRESS NOTE    MACKAY HANAUER   UJW:[id number] DOB: 05-Sep-1940  DOA: 10/05/2023 Date of Service: 11/11/23 which is hospital day 37  PCP: Helaine Llanos, MD    Hospital course / significant events:   HPI: 83 y.o male with significant PMH of OSA, GERD, Obesity, HTN, Dysphagia - presented to the ED 10/05/2023 from with hypoxia, fever and generalized weakness. Dx sepsis/pneumonia, concern for aspiration   Of note, EGD 03/2022 with food in upper esophagus complicated by aspiration event, cardiac arrest with round of CPR, and post resuscitation EGD with concern for lack of peristalsis. Hx rior EGD 08/2020 with note of abnormal cricopharyngeus, decrease in motility in esophagus, and spastic LES who   5/10: Admit to [facility] service with sepsis due to Aspiration Pneumonia.  Course complicated by Acute Respiratory Failure due to Aspiration of vomitus requiring s/p cardiac arrest  transfer to ICU and intubation.  5/11 flexible bronchoscopy done by critical care team 5/12 remains intubated 5/13 remains on vent, s/p PEG tube, +vomiting last night, failed SAT/SBT 5/14 extubated successfully but with severe delirium 5/20.  Botulinum toxin injection into the lower esophageal sphincter by Dr. Ole Berkeley 5/21 esophagram showing diffusely distended esophagus with distal obstruction and severe dysmotility suggestive of achalasia. 5/22.  Advised I would not give him any food and will continue PEG feeding he can try liquids to see if that goes down but still high risk for aspiration.   5/22: fall  5/23.   Awaiting insurance authorization for rehab. 5/24.  Tolerating some clear luquids\ /25.  Patient pulled out PEG tube this morning.  Foley catheter placed in the stoma.  GI able to place a another PEG tube and remove the Foley. 5/26.  Creatinine up at 1.83.  Continue IV fluid hydration.  Patient started having pain after tube feeding started.  CT scan showed that the PEG tube was not in the correct place.   Notified GI.  Started empiric antibiotics. 5/27.  Patient was taken urgently to the operating room by Dr. Cornel Diesel because the patient had fever and elevated white count with suspected intra-abdominal sepsis. 5/28: restart tube feeds  05/30: advancing tube feeds, remains on Zosyn   06/01: on and off AKI 06/04: family working on decision for SNF  06/07: repeat CT abd d/t higher WBC, holding tube feeds but CT nothing acute  06/09: remains stable, pending SNF auth. New melena  06/10: GI repeat EGD, no bleeding  06/11: GI nothing else to add and HH stable GI has s/o, Cr up a bit, increasing free water , granddaughter requests reeval from SLP will ask them to see him tm  06/12: nephrology consult - restart IV fluids and monitor  06/14: cr continues to worsen despite fluids via peg and iv, WBC increasing, not meeting sirs/sepsis but will workup for infection.  06/15: complicated course today re: communication w/ family and medical team plan/goals - see progress note for details. In short, renal failure, was comfort measures then this was reversed back to aggressive treatment / full code. Watch renal fxn into tomorrow and decision at that time for dialysis or not 06/16: discussed at length w/ nephro and family, plan initiate HD today   Current consultants following:  Nephrology Vascular surgery  Informal d/w urology today re hydronephrosis noted on CT, it appears baseline and no plans for intervention     ASSESSMENT & PLAN:   AKI (acute kidney injury) with possible progression to ckd 3b/4 or to renal failure  Fluid overload  Associated  hyperkalemia, hyponatremia  Initiate dialysis today - continue per nephrology  Monitor renal fxn closely Monitor UOP, Foley for strict monitoring  Hyperkalemia - Insulin /D50, kayexalate though these will not correct underlying pathology, plan dialysis  Hyponatremia - cannot correct underlying pathology, will not give 3%NS, defer to nephrology / plan  dialysis Vascular consult for cath placement   Goals of care  Discussed today w/ patient's sons on the unit, this morning with [name] and his brother, this afternoon with [name].  Son, [name] will be the primary and only point of contact and we will not discuss anything with Danelle Dunning unless she is on speaker phone or conference call w/ him present  Re code status: [name] confirms that the patient would want CPR and life support if needed, even if unlikely to be successful. Reasoning is: if or when arrest occurs, if patient does not survive CPR etc, then at least everything has been tried.  I have asked family to provide us  with HCPOA / advanced directive / living will paperwork asap   Leukocytosis Abn UA No other infecitous source suspected Ceftriaxone  Await culture  Decreased bowel sounds  Abd distention Question ileus  Holding tube feeds No N/V, but low threshold for NG if no BM and if nausea/vomiting develops  Melena - resolved  Anemia chronic disease  Repeat EGD 06/10 no concerns, GI has s/o HH has been stable Follow CBC    Severe sepsis - resolved  Initially w/ aspiration pna Later in hospital stay w/ intra-abdominal infection secondary to PEG tube not being in the right place See hospital course   Achalasia Dysphagia Aspiration risk is high  Status post botulinum toxin injection on 5/20.   tube feeds as able, holding for now as above  N.p.o. for the foreseeable future per GI - discussed risk/benefit w/ granddaughter. Advised granddaughter that if she decides for po, she must accept risk for aspiration  SLP to follow  Will need outpatient referral to tertiary advanced GI for consideration of POEMS procedure.  Recommend this only after a few weeks recovery period.    Delirium Agitation/sundowning Question baseline mild cognitive impairment  Granddaughter has asked to discontinue prn antipsychotics  GERD (gastroesophageal reflux disease) Continue PPI   Acute respiratory  failure with hypoxia and hypercapnia - resolved Pneumonia - resolved HFpEF - stable  Now resolved Monitoring periodic CXR  Caution w/ IV fluids - currently held Dialysis    Essential hypertension, benign Bp wnl off meds Monitor VS     Generalized weakness Therapy as tolerated Family aware he will have to sit up reliable before would be a good candidate for long term dialysis       Class 1 obesity based on BMI: Body mass index is 32.44 kg/m.Aaron Aas Significantly low or high BMI is associated with higher medical risk.  Underweight - under 18  overweight - 25 to 29 obese - 30 or more Class 1 obesity: BMI of 30.0 to 34 Class 2 obesity: BMI of 35.0 to 39 Class 3 obesity: BMI of 40.0 to 49 Super Morbid Obesity: BMI 50-59 Super-super Morbid Obesity: BMI 60+ Healthy nutrition and physical activity advised as adjunct to other disease management and risk reduction treatments    DVT prophylaxis: none w/ anemia IV fluids: holding IV fluids  Nutrition: holding tube feeds, Central lines / other devices: PEG, Foley  Code Status: FULL CODE ACP documentation reviewed:  none on file in [facility] - havse asked family to provide us  w/ docuemntation re HCPOA, LW, AD  TOC needs: placement Medical barriers to dispo: renal function. Starting dialysis               Subjective / Brief ROS:  Patient reports just dont feels good Asked about pain he said yeah and points to abdomen States no nausea States he feels breathing is fine .    Family Communication: see above    Objective Findings:  Vitals:   11/10/23 1547 11/10/23 1908 11/11/23 0340 11/11/23 0500  BP: 123/65 116/64 119/71   Pulse: 94 94 96   Resp:  20 20   Temp: 98.6 F (37 C) 100 F (37.8 C) 99.3 F (37.4 C)   TempSrc: Oral Axillary Axillary   SpO2: 93% 90% 92%   Weight:    105 kg  Height:        Intake/Output Summary (Last 24 hours) at 11/11/2023 0950 Last data filed at 11/11/2023 0503 Gross per 24 hour   Intake 1553.27 ml  Output --  Net 1553.27 ml   Filed Weights   11/09/23 0409 11/10/23 0500 11/11/23 0500  Weight: 99.3 kg 102 kg 105 kg    Examination:  Physical Exam Constitutional:      General: He is not in acute distress.    Appearance: He is not ill-appearing.   Cardiovascular:     Rate and Rhythm: Normal rate and regular rhythm.  Pulmonary:     Effort: Pulmonary effort is normal.     Breath sounds: Normal breath sounds.  Abdominal:     General: There is distension.     Palpations: There is no mass.     Tenderness: There is no abdominal tenderness. There is no guarding or rebound.     Comments: Reduced bowel sounds    Musculoskeletal:     Right lower leg: Edema (trace) present.     Left lower leg: Edema (trace) present.   Neurological:     Mental Status: He is alert. Mental status is at baseline. He is disoriented.   Psychiatric:        Behavior: Behavior normal.          Scheduled Medications:   Chlorhexidine  Gluconate Cloth  6 each Topical Daily   dextrose   1 ampule Intravenous Once   free water   200 mL Per Tube Q4H   gentamicin  ointment   Topical TID   insulin  aspart  10 Units Subcutaneous Once   mouth rinse  15 mL Mouth Rinse 4 times per day   pantoprazole  (PROTONIX ) IV  40 mg Intravenous Q12H   sodium zirconium cyclosilicate  5 g Per Tube Daily    Continuous Infusions:  calcium gluconate     cefTRIAXone  (ROCEPHIN )  IV 1 g (11/10/23 1814)   feeding supplement (OSMOLITE 1.5 CAL) 65 mL/hr at 11/10/23 1800    PRN Medications:  acetaminophen  **OR** acetaminophen , artificial tears, bisacodyl , diphenhydrAMINE, ipratropium-albuterol , [DISCONTINUED] ondansetron  **OR** ondansetron  (ZOFRAN ) IV, mouth rinse, mouth rinse, ziprasidone  Antimicrobials from admission:  Anti-infectives (From admission, onward)    Start     Dose/Rate Route Frequency Ordered Stop   11/10/23 1715  cefTRIAXone  (ROCEPHIN ) 1 g in sodium chloride  0.9 % 100 mL IVPB        1  g 200 mL/hr over 30 Minutes Intravenous Every 24 hours 11/10/23 1623     10/21/23 2200  Ampicillin -Sulbactam (UNASYN ) 3 g in sodium chloride  0.9 % 100 mL IVPB  Status:  Discontinued        3 g 200 mL/hr over 30 Minutes Intravenous Every  6 hours 10/21/23 2011 10/21/23 2014   10/21/23 2200  piperacillin -tazobactam (ZOSYN ) IVPB 3.375 g        3.375 g 12.5 mL/hr over 240 Minutes Intravenous Every 8 hours 10/21/23 2019 10/26/23 2359   10/06/23 1600  cefTRIAXone  (ROCEPHIN ) 2 g in sodium chloride  0.9 % 100 mL IVPB        2 g 200 mL/hr over 30 Minutes Intravenous Every 24 hours 10/06/23 1415 10/09/23 1749   10/06/23 0600  piperacillin -tazobactam (ZOSYN ) IVPB 3.375 g  Status:  Discontinued        3.375 g 12.5 mL/hr over 240 Minutes Intravenous Every 8 hours 10/06/23 0356 10/06/23 1415   10/06/23 0500  cefTRIAXone  (ROCEPHIN ) 2 g in sodium chloride  0.9 % 100 mL IVPB  Status:  Discontinued        2 g 200 mL/hr over 30 Minutes Intravenous Every 24 hours 10/05/23 0750 10/06/23 0341   10/06/23 0500  azithromycin  (ZITHROMAX ) 500 mg in sodium chloride  0.9 % 250 mL IVPB  Status:  Discontinued        500 mg 250 mL/hr over 60 Minutes Intravenous Every 24 hours 10/05/23 0750 10/06/23 1409   10/05/23 0615  metroNIDAZOLE  (FLAGYL ) IVPB 500 mg        500 mg 100 mL/hr over 60 Minutes Intravenous  Once 10/05/23 0611 10/05/23 0910   10/05/23 0545  cefTRIAXone  (ROCEPHIN ) 2 g in sodium chloride  0.9 % 100 mL IVPB        2 g 200 mL/hr over 30 Minutes Intravenous Once 10/05/23 0533 10/05/23 0650   10/05/23 0545  azithromycin  (ZITHROMAX ) 500 mg in sodium chloride  0.9 % 250 mL IVPB        500 mg 250 mL/hr over 60 Minutes Intravenous  Once 10/05/23 0533 10/05/23 0805           Data Reviewed:  I have personally reviewed the following...  CBC: Recent Labs  Lab 11/07/23 0446 11/08/23 0529 11/09/23 0404 11/10/23 1544 11/11/23 0403  WBC 12.8* 13.4* 15.4* 17.4* 17.1*  HGB 8.4* 8.5* 8.3* 8.4* 8.0*  HCT 26.5*  26.5* 25.8* 25.7* 24.2*  MCV 94.6 95.7 95.2 92.1 91.3  PLT 360 340 346 365 374   Basic Metabolic Panel: Recent Labs  Lab 11/08/23 0529 11/09/23 0404 11/10/23 0537 11/10/23 1544 11/11/23 0403  NA 145 145 135 133* 136  K 4.6 4.6 5.4* 5.9* 6.3*  CL 110 110 101 101 101  CO2 26 25 24 22  21*  GLUCOSE 140* 118* 144* 106* 130*  BUN 66* 60* 85* 101* 107*  CREATININE 2.69* 2.73* 4.90* 5.72* 6.48*  CALCIUM 7.7* 7.7* 7.3* 7.3* 7.4*  MG  --  2.5*  --   --   --   PHOS  --  4.7*  --   --   --    GFR: Estimated Creatinine Clearance: 10.1 mL/min (A) (by C-G formula based on SCr of 6.48 mg/dL (H)). Liver Function Tests: Recent Labs  Lab 11/10/23 1544  AST 73*  ALT 84*  ALKPHOS 92  BILITOT 0.8  PROT 6.4*  ALBUMIN 1.9*   No results for input(s): LIPASE, AMYLASE in the last 168 hours. No results for input(s): AMMONIA in the last 168 hours. Coagulation Profile: No results for input(s): INR, PROTIME in the last 168 hours. Cardiac Enzymes: No results for input(s): CKTOTAL, CKMB, CKMBINDEX, TROPONINI in the last 168 hours. BNP (last 3 results) No results for input(s): PROBNP in the last 8760 hours. HbA1C: No results for input(s): HGBA1C in the  last 72 hours. CBG: Recent Labs  Lab 11/10/23 0830 11/10/23 1637 11/10/23 1945 11/10/23 2333 11/11/23 0336  GLUCAP 138* 105* 115* 139* 137*   Lipid Profile: No results for input(s): CHOL, HDL, LDLCALC, TRIG, CHOLHDL, LDLDIRECT in the last 72 hours. Thyroid  Function Tests: No results for input(s): TSH, T4TOTAL, FREET4, T3FREE, THYROIDAB in the last 72 hours. Anemia Panel: No results for input(s): VITAMINB12, FOLATE, FERRITIN, TIBC, IRON, RETICCTPCT in the last 72 hours.  Most Recent Urinalysis On File:     Component Value Date/Time   COLORURINE YELLOW (A) 11/10/2023 0158   APPEARANCEUR HAZY (A) 11/10/2023 0158   LABSPEC 1.010 11/10/2023 0158   PHURINE 6.0 11/10/2023 0158    GLUCOSEU NEGATIVE 11/10/2023 0158   HGBUR NEGATIVE 11/10/2023 0158   BILIRUBINUR NEGATIVE 11/10/2023 0158   KETONESUR NEGATIVE 11/10/2023 0158   PROTEINUR 100 (A) 11/10/2023 0158   NITRITE NEGATIVE 11/10/2023 0158   LEUKOCYTESUR TRACE (A) 11/10/2023 0158   Sepsis Labs: @LABRCNTIP (procalcitonin:4,lacticidven:4) Microbiology: Recent Results (from the past 240 hours)  Resp panel by RT-PCR (RSV, Flu A&B, Covid) Anterior Nasal Swab     Status: None   Collection Time: 11/09/23  5:31 PM   Specimen: Anterior Nasal Swab  Result Value Ref Range Status   SARS Coronavirus 2 by RT PCR NEGATIVE NEGATIVE Final    Comment: (NOTE) SARS-CoV-2 target nucleic acids are NOT DETECTED.  The SARS-CoV-2 RNA is generally detectable in upper respiratory specimens during the acute phase of infection. The lowest concentration of SARS-CoV-2 viral copies this assay can detect is 138 copies/mL. A negative result does not preclude SARS-Cov-2 infection and should not be used as the sole basis for treatment or other patient management decisions. A negative result may occur with  improper specimen collection/handling, submission of specimen other than nasopharyngeal swab, presence of viral mutation(s) within the areas targeted by this assay, and inadequate number of viral copies(<138 copies/mL). A negative result must be combined with clinical observations, patient history, and epidemiological information. The expected result is Negative.  Fact Sheet for Patients:  BloggerCourse.com  Fact Sheet for Healthcare Providers:  SeriousBroker.it  This test is no t yet approved or cleared by the [place]  FDA and  has been authorized for detection and/or diagnosis of SARS-CoV-2 by FDA under an Emergency Use Authorization (EUA). This EUA will remain  in effect (meaning this test can be used) for the duration of the COVID-19 declaration under Section 564(b)(1) of the  Act, 21 U.S.C.section 360bbb-3(b)(1), unless the authorization is terminated  or revoked sooner.       Influenza A by PCR NEGATIVE NEGATIVE Final   Influenza B by PCR NEGATIVE NEGATIVE Final    Comment: (NOTE) The Xpert Xpress SARS-CoV-2/FLU/RSV plus assay is intended as an aid in the diagnosis of influenza from Nasopharyngeal swab specimens and should not be used as a sole basis for treatment. Nasal washings and aspirates are unacceptable for Xpert Xpress SARS-CoV-2/FLU/RSV testing.  Fact Sheet for Patients: BloggerCourse.com  Fact Sheet for Healthcare Providers: SeriousBroker.it  This test is not yet approved or cleared by the [place]  FDA and has been authorized for detection and/or diagnosis of SARS-CoV-2 by FDA under an Emergency Use Authorization (EUA). This EUA will remain in effect (meaning this test can be used) for the duration of the COVID-19 declaration under Section 564(b)(1) of the Act, 21 U.S.C. section 360bbb-3(b)(1), unless the authorization is terminated or revoked.     Resp Syncytial Virus by PCR NEGATIVE NEGATIVE Final    Comment: (NOTE)  Fact Sheet for Patients: BloggerCourse.com  Fact Sheet for Healthcare Providers: SeriousBroker.it  This test is not yet approved or cleared by the [place]  FDA and has been authorized for detection and/or diagnosis of SARS-CoV-2 by FDA under an Emergency Use Authorization (EUA). This EUA will remain in effect (meaning this test can be used) for the duration of the COVID-19 declaration under Section 564(b)(1) of the Act, 21 U.S.C. section 360bbb-3(b)(1), unless the authorization is terminated or revoked.  Performed at [facility], [address] [place], [place] [address]   Respiratory (~20 pathogens) panel by PCR     Status: None   Collection Time: 11/09/23  5:31 PM   Specimen: Nasopharyngeal  Swab; Respiratory  Result Value Ref Range Status   Adenovirus NOT DETECTED NOT DETECTED Final   Coronavirus 229E NOT DETECTED NOT DETECTED Final    Comment: (NOTE) The Coronavirus on the Respiratory Panel, DOES NOT test for the novel  Coronavirus (2019 nCoV)    Coronavirus HKU1 NOT DETECTED NOT DETECTED Final   Coronavirus NL63 NOT DETECTED NOT DETECTED Final   Coronavirus OC43 NOT DETECTED NOT DETECTED Final   Metapneumovirus NOT DETECTED NOT DETECTED Final   Rhinovirus / Enterovirus NOT DETECTED NOT DETECTED Final   Influenza A NOT DETECTED NOT DETECTED Final   Influenza B NOT DETECTED NOT DETECTED Final   Parainfluenza Virus 1 NOT DETECTED NOT DETECTED Final   Parainfluenza Virus 2 NOT DETECTED NOT DETECTED Final   Parainfluenza Virus 3 NOT DETECTED NOT DETECTED Final   Parainfluenza Virus 4 NOT DETECTED NOT DETECTED Final   Respiratory Syncytial Virus NOT DETECTED NOT DETECTED Final   Bordetella pertussis NOT DETECTED NOT DETECTED Final   Bordetella Parapertussis NOT DETECTED NOT DETECTED Final   Chlamydophila pneumoniae NOT DETECTED NOT DETECTED Final   Mycoplasma pneumoniae NOT DETECTED NOT DETECTED Final    Comment: Performed at [facility], [address] [place], [place] [address]  Culture, blood (Routine X 2) w Reflex to ID Panel     Status: None (Preliminary result)   Collection Time: 11/09/23  6:16 PM   Specimen: BLOOD  Result Value Ref Range Status   Specimen Description BLOOD RIGHT ANTECUBITAL  Final   Special Requests   Final    BOTTLES DRAWN AEROBIC ONLY Blood Culture adequate volume   Culture   Final    NO GROWTH 2 DAYS Performed at [facility], [address] [place], [place] [address]    Report Status PENDING  Incomplete  Culture, blood (Routine X 2) w Reflex to ID Panel     Status: None (Preliminary result)   Collection Time: 11/09/23  6:23 PM   Specimen: BLOOD  Result Value Ref Range Status   Specimen Description BLOOD BLOOD LEFT  FOREARM  Final   Special Requests   Final    BOTTLES DRAWN AEROBIC AND ANAEROBIC Blood Culture adequate volume   Culture   Final    NO GROWTH 2 DAYS Performed at [facility], [address] [place], [place] [address]    Report Status PENDING  Incomplete  Urine Culture     Status: Abnormal (Preliminary result)   Collection Time: 11/10/23  1:58 AM   Specimen: Urine, Random  Result Value Ref Range Status   Specimen Description   Final    URINE, RANDOM Performed at [facility], [address] [place], [place] [address]    Special Requests   Final    NONE Reflexed from [id number] Performed  at [facility], [address], [place], [place] [address]    Culture (A)  Final    60,000 COLONIES/mL ENTEROCOCCUS FAECALIS SUSCEPTIBILITIES TO FOLLOW Performed at [facility], [address] [place], [place] [address]    Report Status PENDING  Incomplete      Radiology Studies last 3 days: CT ABDOMEN PELVIS WO CONTRAST Result Date: 11/10/2023 CLINICAL DATA:  Gastrostomy, abdominal distension. EXAM: CT ABDOMEN AND PELVIS WITHOUT CONTRAST TECHNIQUE: Multidetector CT imaging of the abdomen and pelvis was performed following the standard protocol without IV contrast. RADIATION DOSE REDUCTION: This exam was performed according to the departmental dose-optimization program which includes automated exposure control, adjustment of the mA and/or kV according to patient size and/or use of iterative reconstruction technique. COMPARISON:  11/02/2023. FINDINGS: Lower chest: Right-sided small pleural effusion and right base consolidation or volume loss. Minimal subsegmental atelectasis left base. Atheromatous calcifications of coronary arteries. No pericardial effusion. Decompressed moderate-sized paraesophageal hiatal hernia. Hepatobiliary: No hepatic parenchymal abnormalities. Small amount of milk of calcium or tiny calcified stones in the gallbladder. Gallbladder is  contracted. Pancreas: Unremarkable. No pancreatic ductal dilatation or surrounding inflammatory changes. Spleen: Normal in size without focal abnormality. Adrenals/Urinary Tract: No adrenal lesions. Bilateral hydronephrosis and hydroureter without evidence of stones. Urinary bladder is empty. Stomach/Bowel: Gastrostomy in the stomach. No bowel dilatation to suggest obstruction. Normal appendix. Vascular/Lymphatic: Aortic atherosclerosis. No enlarged abdominal or pelvic lymph nodes. Reproductive: Prostate is unremarkable. Other: No abdominal wall defects. There is fluid in the pararenal space as well as in the paracolic gutters. Edema or inflammatory changes represent an interval change from the prior study extending along the pericolic gutters bilaterally as well as in the lower abdominal retroperitoneum. Musculoskeletal: No acute or significant osseous findings. IMPRESSION: 1. Right-sided pleural effusion and right base consolidation or volume loss. 2. Bilateral hydronephrosis and hydroureter without evidence of stones. 3. Fluid in the pararenal space and paracolic gutters. Inflammatory edematous changes extend into the pelvis in the presacral region. 4. Moderate-sized paraesophageal hiatal hernia. Gastrostomy appropriately positioned in the stomach. 5. Aortic atherosclerosis (ICD10-I70.0). Electronically Signed   By: Sydell Eva M.D.   On: 11/10/2023 10:14   DG Abd 1 View Result Date: 11/10/2023 EXAM: 1 VIEW XRAY OF THE ABDOMEN SUPINE 11/10/2023 12:08:49 AM COMPARISON: CT abdomen/pelvis dated 12/02/2023. CLINICAL HISTORY: Abdominal distension, constipation. FINDINGS: BOWEL: Nonobstructive bowel gas pattern. Residual contrast in the distal colon with suspected sigmoid diverticulosis. PERITONEUM AND SOFT TISSUES: Suspected gastrostomy overlying the stomach. BONES: Mild degenerative changes of the right hip. No acute osseous abnormality. IMPRESSION: 1. No acute findings. 2. Suspected gastrostomy overlying the  stomach. Electronically signed by: Zadie Herter MD 11/10/2023 12:15 AM EDT RP Workstation: [id number]   DG Chest Port 1 View Result Date: 11/08/2023 CLINICAL DATA:  Congestive heart failure EXAM: PORTABLE CHEST 1 VIEW COMPARISON:  Chest radiograph dated 10/29/2023 FINDINGS: Patient is rotated to the right. Low lung volumes with bronchovascular crowding. Increased diffuse interstitial opacities. Similar trace bilateral pleural effusions. No pneumothorax. Similar enlarged cardiomediastinal silhouette. No acute osseous abnormality. IMPRESSION: 1. Increased diffuse interstitial opacities, likely pulmonary edema. 2. Similar trace bilateral pleural effusions. Electronically Signed   By: Limin  Xu M.D.   On: 11/08/2023 13:18     Time spent: 90 minutes    Melodi Sprung, DO [facility] 11/11/2023, 9:50 AM    Dictation software may have been used to generate the above note. Typos may occur and escape review in typed/dictated notes. Please contact Dr Authur Leghorn directly for  clarity if needed.  Staff may message me via secure chat in Epic  but this may not receive an immediate response,  [email or web address] page me for urgent matters!  If 7PM-[email or web address] please contact night coverage [email or web address]

## 2023-11-11 NOTE — Progress Notes (Signed)
 [facility]  ROUNDING NOTE   Subjective:   Sitter at bedside Patient is awake today and appears uncomfortable.  His breathing is shallow and fast. Abdomen is distended. Foley catheter in place with minimal urine. Tube feeds going at 65 cc/h.  Objective:  Vital signs in last 24 hours:  Temp:  [98.6 F (37 C)-100 F (37.8 C)] 99.3 F (37.4 C) (06/16 0340) Pulse Rate:  [94-96] 96 (06/16 0340) Resp:  [20] 20 (06/16 0340) BP: (116-123)/(64-71) 119/71 (06/16 0340) SpO2:  [90 %-93 %] 92 % (06/16 0340) Weight:  [105 kg] 105 kg (06/16 0500)  Weight change: 3 kg Filed Weights   11/09/23 0409 11/10/23 0500 11/11/23 0500  Weight: 99.3 kg 102 kg 105 kg    Intake/Output: I/O last 3 completed shifts: In: 4517.7 [I.V.:684.1; NG/GT:3733.6; IV Piggyback:100] Out: 50 [Urine:50]   Intake/Output this shift:  No intake/output data recorded.  Physical Exam: General: Chronically ill-appearing, uncomfortable at present  Head: Dry mucosal membranes  Eyes: Anicteric,  Lungs:  Tachypneic, shallow breathing, coarse breath sounds  Heart: Regular rate and rhythm  Abdomen:  Peg tube in place, midline incision, distended, decreased breath sounds  Extremities:  Trace peripheral edema.  Neurologic: Able to answer few simple questions  Skin: No lesions  Access: None  Foley catheter in place  Basic Metabolic Panel: Recent Labs  Lab 11/08/23 0529 11/09/23 0404 11/10/23 0537 11/10/23 1544 11/11/23 0403  NA 145 145 135 133* 136  K 4.6 4.6 5.4* 5.9* 6.3*  CL 110 110 101 101 101  CO2 26 25 24 22  21*  GLUCOSE 140* 118* 144* 106* 130*  BUN 66* 60* 85* 101* 107*  CREATININE 2.69* 2.73* 4.90* 5.72* 6.48*  CALCIUM 7.7* 7.7* 7.3* 7.3* 7.4*  MG  --  2.5*  --   --   --   PHOS  --  4.7*  --   --   --     Liver Function Tests: Recent Labs  Lab 11/10/23 1544  AST 73*  ALT 84*  ALKPHOS 92  BILITOT 0.8  PROT 6.4*  ALBUMIN 1.9*   No results for input(s): LIPASE, AMYLASE in  the last 168 hours. No results for input(s): AMMONIA in the last 168 hours.  CBC: Recent Labs  Lab 11/07/23 0446 11/08/23 0529 11/09/23 0404 11/10/23 1544 11/11/23 0403  WBC 12.8* 13.4* 15.4* 17.4* 17.1*  HGB 8.4* 8.5* 8.3* 8.4* 8.0*  HCT 26.5* 26.5* 25.8* 25.7* 24.2*  MCV 94.6 95.7 95.2 92.1 91.3  PLT 360 340 346 365 374    Cardiac Enzymes: No results for input(s): CKTOTAL, CKMB, CKMBINDEX, TROPONINI in the last 168 hours.  BNP: Invalid input(s): POCBNP  CBG: Recent Labs  Lab 11/10/23 0830 11/10/23 1637 11/10/23 1945 11/10/23 2333 11/11/23 0336  GLUCAP 138* 105* 115* 139* 137*    Microbiology: Results for orders placed or performed during the hospital encounter of 10/05/23  Blood Culture (routine x 2)     Status: None   Collection Time: 10/05/23  5:06 AM   Specimen: BLOOD  Result Value Ref Range Status   Specimen Description BLOOD LA  Final   Special Requests   Final    BOTTLES DRAWN AEROBIC AND ANAEROBIC Blood Culture results may not be optimal due to an inadequate volume of blood received in culture bottles   Culture   Final    NO GROWTH 5 DAYS Performed at [facility], [address] [place], [place] [address]    Report Status 10/10/2023 FINAL  Final  Blood Culture (routine x 2)     Status: None   Collection Time: 10/05/23  5:07 AM   Specimen: BLOOD  Result Value Ref Range Status   Specimen Description BLOOD RA  Final   Special Requests   Final    BOTTLES DRAWN AEROBIC AND ANAEROBIC Blood Culture results may not be optimal due to an inadequate volume of blood received in culture bottles   Culture   Final    NO GROWTH 5 DAYS Performed at [facility], [address] [place], [place] [address]    Report Status 10/10/2023 FINAL  Final  Resp panel by RT-PCR (RSV, Flu A&B, Covid) Anterior Nasal Swab     Status: None   Collection Time: 10/05/23  5:56 AM   Specimen: Anterior Nasal Swab  Result Value Ref Range Status    SARS Coronavirus 2 by RT PCR NEGATIVE NEGATIVE Final    Comment: (NOTE) SARS-CoV-2 target nucleic acids are NOT DETECTED.  The SARS-CoV-2 RNA is generally detectable in upper respiratory specimens during the acute phase of infection. The lowest concentration of SARS-CoV-2 viral copies this assay can detect is 138 copies/mL. A negative result does not preclude SARS-Cov-2 infection and should not be used as the sole basis for treatment or other patient management decisions. A negative result may occur with  improper specimen collection/handling, submission of specimen other than nasopharyngeal swab, presence of viral mutation(s) within the areas targeted by this assay, and inadequate number of viral copies(<138 copies/mL). A negative result must be combined with clinical observations, patient history, and epidemiological information. The expected result is Negative.  Fact Sheet for Patients:  BloggerCourse.com  Fact Sheet for Healthcare Providers:  SeriousBroker.it  This test is no t yet approved or cleared by the [place]  FDA and  has been authorized for detection and/or diagnosis of SARS-CoV-2 by FDA under an Emergency Use Authorization (EUA). This EUA will remain  in effect (meaning this test can be used) for the duration of the COVID-19 declaration under Section 564(b)(1) of the Act, 21 U.S.C.section 360bbb-3(b)(1), unless the authorization is terminated  or revoked sooner.       Influenza A by PCR NEGATIVE NEGATIVE Final   Influenza B by PCR NEGATIVE NEGATIVE Final    Comment: (NOTE) The Xpert Xpress SARS-CoV-2/FLU/RSV plus assay is intended as an aid in the diagnosis of influenza from Nasopharyngeal swab specimens and should not be used as a sole basis for treatment. Nasal washings and aspirates are unacceptable for Xpert Xpress SARS-CoV-2/FLU/RSV testing.  Fact Sheet for  Patients: BloggerCourse.com  Fact Sheet for Healthcare Providers: SeriousBroker.it  This test is not yet approved or cleared by the [place]  FDA and has been authorized for detection and/or diagnosis of SARS-CoV-2 by FDA under an Emergency Use Authorization (EUA). This EUA will remain in effect (meaning this test can be used) for the duration of the COVID-19 declaration under Section 564(b)(1) of the Act, 21 U.S.C. section 360bbb-3(b)(1), unless the authorization is terminated or revoked.     Resp Syncytial Virus by PCR NEGATIVE NEGATIVE Final    Comment: (NOTE) Fact Sheet for Patients: BloggerCourse.com  Fact Sheet for Healthcare Providers: SeriousBroker.it  This test is not yet approved or cleared by the [place]  FDA and has been authorized for detection and/or diagnosis of SARS-CoV-2 by FDA under an Emergency Use Authorization (EUA). This EUA will remain in effect (meaning this test can be used) for the duration of the COVID-19 declaration under Section 564(b)(1) of the  Act, 21 U.S.C. section 360bbb-3(b)(1), unless the authorization is terminated or revoked.  Performed at [facility], [address] [place], [place] [address]   Respiratory (~20 pathogens) panel by PCR     Status: None   Collection Time: 10/05/23  8:11 AM   Specimen: Nasopharyngeal Swab; Respiratory  Result Value Ref Range Status   Adenovirus NOT DETECTED NOT DETECTED Final   Coronavirus 229E NOT DETECTED NOT DETECTED Final    Comment: (NOTE) The Coronavirus on the Respiratory Panel, DOES NOT test for the novel  Coronavirus (2019 nCoV)    Coronavirus HKU1 NOT DETECTED NOT DETECTED Final   Coronavirus NL63 NOT DETECTED NOT DETECTED Final   Coronavirus OC43 NOT DETECTED NOT DETECTED Final   Metapneumovirus NOT DETECTED NOT DETECTED Final   Rhinovirus / Enterovirus NOT DETECTED NOT  DETECTED Final   Influenza A NOT DETECTED NOT DETECTED Final   Influenza B NOT DETECTED NOT DETECTED Final   Parainfluenza Virus 1 NOT DETECTED NOT DETECTED Final   Parainfluenza Virus 2 NOT DETECTED NOT DETECTED Final   Parainfluenza Virus 3 NOT DETECTED NOT DETECTED Final   Parainfluenza Virus 4 NOT DETECTED NOT DETECTED Final   Respiratory Syncytial Virus NOT DETECTED NOT DETECTED Final   Bordetella pertussis NOT DETECTED NOT DETECTED Final   Bordetella Parapertussis NOT DETECTED NOT DETECTED Final   Chlamydophila pneumoniae NOT DETECTED NOT DETECTED Final   Mycoplasma pneumoniae NOT DETECTED NOT DETECTED Final    Comment: Performed at [facility], [address], [place], [place] [address]  Expectorated Sputum Assessment w Gram Stain, Rflx to Resp Cult     Status: None   Collection Time: 10/05/23  9:07 AM   Specimen: Sputum  Result Value Ref Range Status   Specimen Description SPUTUM  Final   Special Requests NONE  Final   Sputum evaluation   Final    Sputum specimen not acceptable for testing.  Please recollect.   C/KERRY NELSON AT 1005 10/05/23.PMF Performed at [facility], [address] [place], [place] [address]    Report Status 10/05/2023 FINAL  Final  Expectorated Sputum Assessment w Gram Stain, Rflx to Resp Cult     Status: None   Collection Time: 10/05/23 10:50 AM  Result Value Ref Range Status   Specimen Description EXPECTORATED SPUTUM  Final   Special Requests NONE  Final   Sputum evaluation   Final    THIS SPECIMEN IS ACCEPTABLE FOR SPUTUM CULTURE Performed at [facility], [address], [place], [place] [address]    Report Status 10/05/2023 FINAL  Final  Culture, Respiratory w Gram Stain     Status: None   Collection Time: 10/05/23 10:50 AM  Result Value Ref Range Status   Specimen Description   Final    EXPECTORATED SPUTUM Performed at [facility], [address] [place], [place] [address]    Special Requests    Final    NONE Reflexed from [id number] Performed at [facility], [address] [place], [place] [address]    Gram Stain   Final    RARE WBC SEEN RARE Hillis Lu POSITIVE RODS RARE Hillis Lu POSITIVE COCCI RARE GRAM NEGATIVE RODS    Culture   Final    FEW Normal respiratory flora-no Staph aureus or Pseudomonas seen Performed at [facility], [address] [place], [place] [address]    Report Status 10/07/2023 FINAL  Final  MRSA Next Gen by PCR, Nasal     Status:  None   Collection Time: 10/06/23 12:57 AM   Specimen: Nasal Mucosa; Nasal Swab  Result Value Ref Range Status   MRSA by PCR Next Gen NOT DETECTED NOT DETECTED Final    Comment: (NOTE) The GeneXpert MRSA Assay (FDA approved for NASAL specimens only), is one component of a comprehensive MRSA colonization surveillance program. It is not intended to diagnose MRSA infection nor to guide or monitor treatment for MRSA infections. Test performance is not FDA approved in patients less than 61 years old. Performed at [facility], [address] [place], [place] [address]   Culture, Respiratory w Gram Stain     Status: None   Collection Time: 10/06/23 11:37 AM   Specimen: INDUCED SPUTUM  Result Value Ref Range Status   Specimen Description   Final    INDUCED SPUTUM Performed at [facility], [address], [place], [place] [address]    Special Requests   Final    NONE Performed at [facility], [address] [place], [place] [address]    Gram Stain   Final    FEW WBC PRESENT,BOTH PMN AND MONONUCLEAR FEW GRAM POSITIVE RODS    Culture   Final    MODERATE LACTOBACILLUS FERMENTUM Standardized susceptibility testing for this organism is not available. Performed at [facility], [address] [place], [place] [address]    Report Status 10/09/2023 FINAL  Final  Culture, blood (x 2)     Status: None   Collection Time: 10/22/23  1:00 AM   Specimen: BLOOD  Result Value Ref Range  Status   Specimen Description BLOOD BLOOD RIGHT ARM  Final   Special Requests   Final    BOTTLES DRAWN AEROBIC AND ANAEROBIC Blood Culture adequate volume   Culture   Final    NO GROWTH 5 DAYS Performed at [facility], [address], [place], [place] [address]    Report Status 10/27/2023 FINAL  Final  Culture, blood (x 2)     Status: None   Collection Time: 10/22/23  1:00 AM   Specimen: BLOOD  Result Value Ref Range Status   Specimen Description BLOOD BLOOD RIGHT ARM  Final   Special Requests   Final    BOTTLES DRAWN AEROBIC AND ANAEROBIC Blood Culture adequate volume   Culture   Final    NO GROWTH 5 DAYS Performed at [facility], [address] [place], [place] [address]    Report Status 10/27/2023 FINAL  Final  Resp panel by RT-PCR (RSV, Flu A&B, Covid) Anterior Nasal Swab     Status: None   Collection Time: 11/09/23  5:31 PM   Specimen: Anterior Nasal Swab  Result Value Ref Range Status   SARS Coronavirus 2 by RT PCR NEGATIVE NEGATIVE Final    Comment: (NOTE) SARS-CoV-2 target nucleic acids are NOT DETECTED.  The SARS-CoV-2 RNA is generally detectable in upper respiratory specimens during the acute phase of infection. The lowest concentration of SARS-CoV-2 viral copies this assay can detect is 138 copies/mL. A negative result does not preclude SARS-Cov-2 infection and should not be used as the sole basis for treatment or other patient management decisions. A negative result may occur with  improper specimen collection/handling, submission of specimen other than nasopharyngeal swab, presence of viral mutation(s) within the areas targeted by this assay, and inadequate number of viral copies(<138 copies/mL). A negative result must be combined with clinical observations, patient history, and epidemiological information. The expected result is Negative.  Fact Sheet for Patients:  BloggerCourse.com  Fact Sheet for Healthcare  Providers:  SeriousBroker.it  This test is no t yet approved or cleared by the [place]  FDA and  has been authorized for detection and/or diagnosis of SARS-CoV-2 by FDA under an Emergency Use Authorization (EUA). This EUA will remain  in effect (meaning this test can be used) for the duration of the COVID-19 declaration under Section 564(b)(1) of the Act, 21 U.S.C.section 360bbb-3(b)(1), unless the authorization is terminated  or revoked sooner.       Influenza A by PCR NEGATIVE NEGATIVE Final   Influenza B by PCR NEGATIVE NEGATIVE Final    Comment: (NOTE) The Xpert Xpress SARS-CoV-2/FLU/RSV plus assay is intended as an aid in the diagnosis of influenza from Nasopharyngeal swab specimens and should not be used as a sole basis for treatment. Nasal washings and aspirates are unacceptable for Xpert Xpress SARS-CoV-2/FLU/RSV testing.  Fact Sheet for Patients: BloggerCourse.com  Fact Sheet for Healthcare Providers: SeriousBroker.it  This test is not yet approved or cleared by the [place]  FDA and has been authorized for detection and/or diagnosis of SARS-CoV-2 by FDA under an Emergency Use Authorization (EUA). This EUA will remain in effect (meaning this test can be used) for the duration of the COVID-19 declaration under Section 564(b)(1) of the Act, 21 U.S.C. section 360bbb-3(b)(1), unless the authorization is terminated or revoked.     Resp Syncytial Virus by PCR NEGATIVE NEGATIVE Final    Comment: (NOTE) Fact Sheet for Patients: BloggerCourse.com  Fact Sheet for Healthcare Providers: SeriousBroker.it  This test is not yet approved or cleared by the [place]  FDA and has been authorized for detection and/or diagnosis of SARS-CoV-2 by FDA under an Emergency Use Authorization (EUA). This EUA will remain in effect (meaning this test can be  used) for the duration of the COVID-19 declaration under Section 564(b)(1) of the Act, 21 U.S.C. section 360bbb-3(b)(1), unless the authorization is terminated or revoked.  Performed at [facility], [address] [place], [place] [address]   Respiratory (~20 pathogens) panel by PCR     Status: None   Collection Time: 11/09/23  5:31 PM   Specimen: Nasopharyngeal Swab; Respiratory  Result Value Ref Range Status   Adenovirus NOT DETECTED NOT DETECTED Final   Coronavirus 229E NOT DETECTED NOT DETECTED Final    Comment: (NOTE) The Coronavirus on the Respiratory Panel, DOES NOT test for the novel  Coronavirus (2019 nCoV)    Coronavirus HKU1 NOT DETECTED NOT DETECTED Final   Coronavirus NL63 NOT DETECTED NOT DETECTED Final   Coronavirus OC43 NOT DETECTED NOT DETECTED Final   Metapneumovirus NOT DETECTED NOT DETECTED Final   Rhinovirus / Enterovirus NOT DETECTED NOT DETECTED Final   Influenza A NOT DETECTED NOT DETECTED Final   Influenza B NOT DETECTED NOT DETECTED Final   Parainfluenza Virus 1 NOT DETECTED NOT DETECTED Final   Parainfluenza Virus 2 NOT DETECTED NOT DETECTED Final   Parainfluenza Virus 3 NOT DETECTED NOT DETECTED Final   Parainfluenza Virus 4 NOT DETECTED NOT DETECTED Final   Respiratory Syncytial Virus NOT DETECTED NOT DETECTED Final   Bordetella pertussis NOT DETECTED NOT DETECTED Final   Bordetella Parapertussis NOT DETECTED NOT DETECTED Final   Chlamydophila pneumoniae NOT DETECTED NOT DETECTED Final   Mycoplasma pneumoniae NOT DETECTED NOT DETECTED Final    Comment: Performed at [facility], [address] [place], [place] [address]  Culture, blood (Routine X 2) w Reflex to ID Panel  Status: None (Preliminary result)   Collection Time: 11/09/23  6:16 PM   Specimen: BLOOD  Result Value Ref Range Status   Specimen Description BLOOD RIGHT ANTECUBITAL  Final   Special Requests   Final    BOTTLES DRAWN AEROBIC ONLY Blood Culture adequate  volume   Culture   Final    NO GROWTH 2 DAYS Performed at [facility], [address], [place], [place] [address]    Report Status PENDING  Incomplete  Culture, blood (Routine X 2) w Reflex to ID Panel     Status: None (Preliminary result)   Collection Time: 11/09/23  6:23 PM   Specimen: BLOOD  Result Value Ref Range Status   Specimen Description BLOOD BLOOD LEFT FOREARM  Final   Special Requests   Final    BOTTLES DRAWN AEROBIC AND ANAEROBIC Blood Culture adequate volume   Culture   Final    NO GROWTH 2 DAYS Performed at [facility], [address] [place], [place] [address]    Report Status PENDING  Incomplete  Urine Culture     Status: None (Preliminary result)   Collection Time: 11/10/23  1:58 AM   Specimen: Urine, Random  Result Value Ref Range Status   Specimen Description   Final    URINE, RANDOM Performed at [facility], [address], [place], [place] [address]    Special Requests   Final    NONE Reflexed from [phone] Performed at [facility], [address], [place], [place] [address]    Culture   Final    CULTURE REINCUBATED FOR BETTER GROWTH Performed at [facility], [address], [place], [place] [address]    Report Status PENDING  Incomplete    Coagulation Studies: No results for input(s): LABPROT, INR in the last 72 hours.  Urinalysis: Recent Labs    11/10/23 0158  COLORURINE YELLOW*  LABSPEC 1.010  PHURINE 6.0  GLUCOSEU NEGATIVE  HGBUR NEGATIVE  BILIRUBINUR NEGATIVE  KETONESUR NEGATIVE  PROTEINUR 100*  NITRITE NEGATIVE  LEUKOCYTESUR TRACE*      Imaging: CT ABDOMEN PELVIS WO CONTRAST Result Date: 11/10/2023 CLINICAL DATA:  Gastrostomy, abdominal distension. EXAM: CT ABDOMEN AND PELVIS WITHOUT CONTRAST TECHNIQUE: Multidetector CT imaging of the abdomen and pelvis was performed following the standard protocol without IV contrast. RADIATION DOSE REDUCTION: This exam was performed according  to the departmental dose-optimization program which includes automated exposure control, adjustment of the mA and/or kV according to patient size and/or use of iterative reconstruction technique. COMPARISON:  11/02/2023. FINDINGS: Lower chest: Right-sided small pleural effusion and right base consolidation or volume loss. Minimal subsegmental atelectasis left base. Atheromatous calcifications of coronary arteries. No pericardial effusion. Decompressed moderate-sized paraesophageal hiatal hernia. Hepatobiliary: No hepatic parenchymal abnormalities. Small amount of milk of calcium or tiny calcified stones in the gallbladder. Gallbladder is contracted. Pancreas: Unremarkable. No pancreatic ductal dilatation or surrounding inflammatory changes. Spleen: Normal in size without focal abnormality. Adrenals/Urinary Tract: No adrenal lesions. Bilateral hydronephrosis and hydroureter without evidence of stones. Urinary bladder is empty. Stomach/Bowel: Gastrostomy in the stomach. No bowel dilatation to suggest obstruction. Normal appendix. Vascular/Lymphatic: Aortic atherosclerosis. No enlarged abdominal or pelvic lymph nodes. Reproductive: Prostate is unremarkable. Other: No abdominal wall defects. There is fluid in the pararenal space as well as in the paracolic gutters. Edema or inflammatory changes represent an interval change from the prior study extending along the pericolic gutters bilaterally as well as in the lower abdominal retroperitoneum. Musculoskeletal: No acute or  significant osseous findings. IMPRESSION: 1. Right-sided pleural effusion and right base consolidation or volume loss. 2. Bilateral hydronephrosis and hydroureter without evidence of stones. 3. Fluid in the pararenal space and paracolic gutters. Inflammatory edematous changes extend into the pelvis in the presacral region. 4. Moderate-sized paraesophageal hiatal hernia. Gastrostomy appropriately positioned in the stomach. 5. Aortic atherosclerosis  (ICD10-I70.0). Electronically Signed   By: Sydell Eva M.D.   On: 11/10/2023 10:14   DG Abd 1 View Result Date: 11/10/2023 EXAM: 1 VIEW XRAY OF THE ABDOMEN SUPINE 11/10/2023 12:08:49 AM COMPARISON: CT abdomen/pelvis dated 12/02/2023. CLINICAL HISTORY: Abdominal distension, constipation. FINDINGS: BOWEL: Nonobstructive bowel gas pattern. Residual contrast in the distal colon with suspected sigmoid diverticulosis. PERITONEUM AND SOFT TISSUES: Suspected gastrostomy overlying the stomach. BONES: Mild degenerative changes of the right hip. No acute osseous abnormality. IMPRESSION: 1. No acute findings. 2. Suspected gastrostomy overlying the stomach. Electronically signed by: Zadie Herter MD 11/10/2023 12:15 AM EDT RP Workstation: [id number]     Medications:    calcium gluconate     cefTRIAXone  (ROCEPHIN )  IV 1 g (11/10/23 1814)   feeding supplement (OSMOLITE 1.5 CAL) 65 mL/hr at 11/10/23 1800    Chlorhexidine  Gluconate Cloth  6 each Topical Daily   dextrose   1 ampule Intravenous Once   free water   200 mL Per Tube Q4H   gentamicin  ointment   Topical TID   insulin  aspart  10 Units Subcutaneous Once   mouth rinse  15 mL Mouth Rinse 4 times per day   pantoprazole  (PROTONIX ) IV  40 mg Intravenous Q12H   sodium zirconium cyclosilicate  5 g Per Tube Daily   acetaminophen  **OR** acetaminophen , artificial tears, bisacodyl , diphenhydrAMINE, ipratropium-albuterol , [DISCONTINUED] ondansetron  **OR** ondansetron  (ZOFRAN ) IV, mouth rinse, mouth rinse, ziprasidone  Assessment/ Plan:  Mr. Robert Vega is a 83 y.o.  male with obstructive sleep apnea, GERD, obesity, hypertension, dysphagia, severe esophageal dysmotility with achalasia status post PEG tube placement, recent aspiration pneumonia acute respiratory failure status post extubation 10/09/2023, who was admitted to [facility] on 10/05/2023 for Aspiration into respiratory tract, initial encounter [T17.908A] Severe sepsis (HCC) [A41.9, R65.20] History  of dysphagia [Z87.898] Fever, unspecified fever cause [R50.9] Multifocal pneumonia [J18.9]   1.  Acute kidney injury .  Baseline creatinine 0.81 from 10/14/2023.  Acute kidney injury secondary to ATN, obstructive uropathy, and progression of prerenal azotemia.   Patient is critically ill and has developed severe hyperkalemia. BUN and creatinine are also critically elevated and worsening. Case discussed with hospitalist team as well as patient's son, [name] by phone. Due to worsening renal parameters and hyperkalemia, will proceed with urgent hemodialysis.  Vascular team has been notified for temporary dialysis catheter placement. -Short-term dialysis to see if clearing of uremia and restoration of electrolyte balance helps improved clinical condition. -Patient's son reports that patient was independent and quite functional prior to admission.   2.  Severe hyperkalemia-urgent hemodialysis today.   3.  Originally admitted for sepsis due to aspiration pneumonia, found to have distal esophageal obstruction with severe dysmotility.  He is status post bottle on toxin injection to the lower esophageal sphincter.  PEG tube placed this admission.  Tube feeds currently on hold due to concern of Ileus.  4.Anemia in the setting of renal failure - Most recent hemoglobin 8.0 and trending lower - Will monitor closely and consider ESA.  5.  Bilateral hydronephrosis Noted on CT.Urology team has evaluated the patient and it is thought to be secondary to reflux.  No intervention at this time  due to fragile health.     LOS: 37 Chaundra Abreu 6/16/20258:59 AM

## 2023-11-11 NOTE — TOC Progression Note (Signed)
 Transition of Care [facility]) - Progression Note    Patient Details  Name: Robert Vega MRN: [id number] Date of Birth: [birth year]  Transition of [facility]) CM/SW Contact  Loman Risk, RN Phone Number: 11/11/2023, 12:34 PM  Clinical Narrative:     Elevated CR, plan to initiate HD today Will require auth for SNF when appropriate        Expected Discharge Plan and Services                                               Social Determinants of Health (SDOH) Interventions SDOH Screenings   Food Insecurity: Patient Unable To Answer (10/06/2023)  Recent Concern: Food Insecurity - Food Insecurity Present (09/11/2023)   Received from [facility]  Housing: Patient Unable To Answer (10/06/2023)  Recent Concern: Housing - High Risk (09/11/2023)   Received from [facility]  Transportation Needs: Patient Unable To Answer (10/06/2023)  Utilities: Patient Unable To Answer (10/06/2023)  Depression (PHQ2-9): Low Risk  (05/07/2023)  Financial Resource Strain: Medium Risk (09/11/2023)   Received from [facility]  Social Connections: Unknown (10/06/2023)  Tobacco Use: Medium Risk (11/05/2023)    Readmission Risk Interventions     No data to display

## 2023-11-11 NOTE — Progress Notes (Signed)
 HD Note:  Some information was entered later than the data was gathered due to patient care needs. The stated time with the data is accurate.  Received patient in bed at bedside room 214.   Alert with confusion.  Informed consent signed and in chart.   Access used: Right Femoral CVC Access issues: None  Patient tolerated 1st HD treatment well.   TX duration: 2 hours  Alert, without acute distress.  Total UF removed: 0.5L  Hand-off given to patient's nurse.   Patient resting in bed with no sign of distress noted.  Sitter at bedside.    Barron Lien, RN [facility].

## 2023-11-11 NOTE — Progress Notes (Addendum)
 Called to bedside by RN Concern for tachycardia, increased resp rate, leukocytosis - in instructed do not call rapid per protocol I will assess  Pt is s/p temp cath placement to R groin no complications. Planning dialysis when tech gets here at 18:30.  BP 126/70 (BP Location: Left Arm)   Pulse 100   Temp 98.9 F (37.2 C) (Oral)   Resp 19   Ht 5' 9.02 (1.753 m)   Wt 105 kg   SpO2 96%   BMI 34.17 kg/m  Gen: calm/NAD, alert but more confused than earlier today.  CV: HR 90s on auscultation, regular Lungs: rhonchorous/bronchial c/w previous exams, RR 20   Intake/Output Summary (Last 24 hours) at 11/11/2023 1720 Last data filed at 11/11/2023 0503 Gross per 24 hour  Intake 1461.65 ml  Output --  Net 1461.65 ml   EKG: no concerns ABG: resp alkalosis/metab acidosis? Need BMP  CXR: no apparent worsening from prior  BMP: pending      Assessment/Plan:  Suspect fluid overload evolving to pulmonary edema but CXR does not support this and at any rate could not diurese. Also question aspiration - he has not had anything po but if secretions are sufficient this may also be contributing. He is not in distress currently but low threshold for BiPAP. Expect dialysis may help if edema but do not expect much fluid off if any this session. Dialysis probably more helpful in correcting electrolyte and acid/base disturbances, BMP pending at this time but ABG as above  Given no substantial pulmonary edema on CXR, suspect increased respiratory rate is probably compensatory response to metabolic acidosis related to renal failure, at this point probably does not need BiPAP support will see how he does with dialysis   Broadening Abx to cover aspiration  Dialysis: planned for this evening     Critical care time 45 minutes

## 2023-11-12 ENCOUNTER — Telehealth: Payer: Self-pay

## 2023-11-12 DIAGNOSIS — R400 Somnolence: Secondary | ICD-10-CM

## 2023-11-12 DIAGNOSIS — Z9889 Other specified postprocedural states: Secondary | ICD-10-CM

## 2023-11-12 DIAGNOSIS — R6520 Severe sepsis without septic shock: Secondary | ICD-10-CM | POA: Diagnosis not present

## 2023-11-12 DIAGNOSIS — A419 Sepsis, unspecified organism: Secondary | ICD-10-CM | POA: Diagnosis not present

## 2023-11-12 DIAGNOSIS — Z95828 Presence of other vascular implants and grafts: Secondary | ICD-10-CM | POA: Diagnosis not present

## 2023-11-12 LAB — URINE CULTURE: Culture: 60000 — AB

## 2023-11-12 LAB — GLUCOSE, CAPILLARY
Glucose-Capillary: 105 mg/dL — ABNORMAL HIGH (ref 70–99)
Glucose-Capillary: 106 mg/dL — ABNORMAL HIGH (ref 70–99)
Glucose-Capillary: 92 mg/dL (ref 70–99)
Glucose-Capillary: 99 mg/dL (ref 70–99)

## 2023-11-12 LAB — HEPATITIS B SURFACE ANTIGEN: Hepatitis B Surface Ag: NONREACTIVE

## 2023-11-12 LAB — RENAL FUNCTION PANEL
Albumin: 1.7 g/dL — ABNORMAL LOW (ref 3.5–5.0)
Albumin: 1.6 g/dL — ABNORMAL LOW (ref 3.5–5.0)
Anion gap: 12 (ref 5–15)
Anion gap: 14 (ref 5–15)
BUN: 89 mg/dL — ABNORMAL HIGH (ref 8–23)
BUN: 62 mg/dL — ABNORMAL HIGH (ref 8–23)
CO2: 25 mmol/L (ref 22–32)
CO2: 23 mmol/L (ref 22–32)
Calcium: 7.4 mg/dL — ABNORMAL LOW (ref 8.9–10.3)
Calcium: 7.2 mg/dL — ABNORMAL LOW (ref 8.9–10.3)
Chloride: 96 mmol/L — ABNORMAL LOW (ref 98–111)
Chloride: 96 mmol/L — ABNORMAL LOW (ref 98–111)
Creatinine, Ser: 6.21 mg/dL — ABNORMAL HIGH (ref 0.61–1.24)
Creatinine, Ser: 4.94 mg/dL — ABNORMAL HIGH (ref 0.61–1.24)
GFR, Estimated: 8 mL/min — ABNORMAL LOW (ref >60)
GFR, Estimated: 11 mL/min — ABNORMAL LOW (ref >60)
Glucose, Bld: 104 mg/dL — ABNORMAL HIGH (ref 70–99)
Glucose, Bld: 103 mg/dL — ABNORMAL HIGH (ref 70–99)
Phosphorus: 7.1 mg/dL — ABNORMAL HIGH (ref 2.5–4.6)
Phosphorus: 6.6 mg/dL — ABNORMAL HIGH (ref 2.5–4.6)
Potassium: 5.9 mmol/L — ABNORMAL HIGH (ref 3.5–5.1)
Potassium: 4.8 mmol/L (ref 3.5–5.1)
Sodium: 133 mmol/L — ABNORMAL LOW (ref 135–145)
Sodium: 133 mmol/L — ABNORMAL LOW (ref 135–145)

## 2023-11-12 LAB — CBC
HCT: 23.4 % — ABNORMAL LOW (ref 39.0–52.0)
Hemoglobin: 7.6 g/dL — ABNORMAL LOW (ref 13.0–17.0)
MCH: 29.8 pg (ref 26.0–34.0)
MCHC: 32.5 g/dL (ref 30.0–36.0)
MCV: 91.8 fL (ref 80.0–100.0)
Platelets: 333 10*3/uL (ref 150–400)
RBC: 2.55 MIL/uL — ABNORMAL LOW (ref 4.22–5.81)
RDW: 14.4 % (ref 11.5–15.5)
WBC: 16.1 10*3/uL — ABNORMAL HIGH (ref 4.0–10.5)
nRBC: 0 % (ref 0.0–0.2)

## 2023-11-12 LAB — MAGNESIUM: Magnesium: 2.2 mg/dL (ref 1.7–2.4)

## 2023-11-12 MED ORDER — EPOETIN ALFA-EPBX 10000 UNIT/ML IJ SOLN
4000.0000 [IU] | INTRAMUSCULAR | Status: DC
  Administered 2023-11-15: 4000 [IU] via INTRAVENOUS
  Filled 2023-11-12 (×2): qty 2

## 2023-11-12 MED ORDER — FREE WATER
30.0000 mL | Status: DC
  Administered 2023-11-12 – 2023-11-22 (×56): 30 mL

## 2023-11-12 MED ORDER — OSMOLITE 1.5 CAL PO LIQD
1000.0000 mL | ORAL | Status: DC
  Administered 2023-11-12: 1000 mL

## 2023-11-12 MED ORDER — RENA-VITE PO TABS
1.0000 | ORAL_TABLET | Freq: Every day | ORAL | Status: DC
  Administered 2023-11-12 – 2023-12-15 (×34): 1
  Filled 2023-11-12 (×34): qty 1

## 2023-11-12 NOTE — Progress Notes (Signed)
  Progress Note    11/12/2023 10:12 AM 1 Day Post-Op  Subjective:  Robert Vega is an 83 yo male who is now POD #1 from temporary dialysis catheter placement.  Patient is resting comfortably in his bed in hemodialysis unit.  His patient is very somnolent did not answer my questions this morning.  Vitals are stable   Vitals:   11/12/23 0930 11/12/23 1000  BP: 90/69 105/63  Pulse: 83 90  Resp: (!) 25 (!) 23  Temp:    SpO2: (!) 86% 99%   Physical Exam: Cardiac:  RRR, normal S1 and S2, no rubs clicks gallops or murmurs. Lungs: Normal nonlabored breathing, no rales rhonchi or wheezing revealing sounds lungs diminished in the bases. Incisions: Right femoral vein with temporary dialysis catheters.  No hematoma seroma or infection noted. Extremities: All extremities warm to touch with palpable pulses. Abdomen: Positive bowel sounds throughout, soft, nontender nondistended. Neurologic: Patient is nonresponsive this morning extremely somnolent.  Neurologic encephalopathy due to the need for dialysis.  CBC    Component Value Date/Time   WBC 16.1 (H) 11/12/2023 0357   RBC 2.55 (L) 11/12/2023 0357   HGB 7.6 (L) 11/12/2023 0357   HCT 23.4 (L) 11/12/2023 0357   PLT 333 11/12/2023 0357   MCV 91.8 11/12/2023 0357   MCH 29.8 11/12/2023 0357   MCHC 32.5 11/12/2023 0357   RDW 14.4 11/12/2023 0357   LYMPHSABS 1.6 10/31/2023 0218   MONOABS 2.3 (H) 10/31/2023 0218   EOSABS 0.1 10/31/2023 0218   BASOSABS 0.1 10/31/2023 0218    BMET    Component Value Date/Time   NA 133 (L) 11/12/2023 0739   K 5.9 (H) 11/12/2023 0739   CL 96 (L) 11/12/2023 0739   CO2 25 11/12/2023 0739   GLUCOSE 104 (H) 11/12/2023 0739   BUN 89 (H) 11/12/2023 0739   CREATININE 6.21 (H) 11/12/2023 0739   CALCIUM 7.4 (L) 11/12/2023 0739   GFRNONAA 8 (L) 11/12/2023 0739   GFRAA 108 04/17/2007 0000    INR    Component Value Date/Time   INR 1.2 10/22/2023 0100     Intake/Output Summary (Last 24 hours) at  11/12/2023 1012 Last data filed at 11/11/2023 2318 Gross per 24 hour  Intake 189.5 ml  Output 500 ml  Net -310.5 ml     Assessment/Plan:  83 y.o. male is s/p temporary dialysis catheter placement 1 Day Post-Op   PLAN Okay per vascular surgery to use temporary dialysis catheter for hemodialysis.  DVT prophylaxis: Heparin    Annamaria Barrette [facility] 11/12/2023 10:12 AM

## 2023-11-12 NOTE — Procedures (Signed)
 HD Note:  Some information was entered later than the data was gathered due to patient care needs. The stated time with the data is accurate.  Received report from off going dialysis nurse at approximately 0915.  Access used: Upper right groin HD catheter Access issues: None  Patient tolerated treatment well.  Mittens on hands to protect patient lines. He moved right had frequently without any ill effects. Patient mumbling with no understandable words.  TX duration: 2.5 hours per Dr. Selestino Dakin Singh's orders given verbally at bedside.  Alert, without acute distress.  Total UF removed: 1000 ml  Hand-off given to patient's nurse.   Transported back to the room   Chesley Valls L. Alva Jewels, RN [facility] Unit.

## 2023-11-12 NOTE — Progress Notes (Signed)
 PROGRESS NOTE    Robert Vega   WNU:[id number] DOB: May 05, 1941  DOA: 10/05/2023 Date of Service: 11/12/23 which is hospital day 38  PCP: Helaine Llanos, MD    Hospital course / significant events:   HPI: 83 y.o male with significant PMH of OSA, GERD, Obesity, HTN, Dysphagia - presented to the ED 10/05/2023 from with hypoxia, fever and generalized weakness. Dx sepsis/pneumonia, concern for aspiration   Of note, EGD 03/2022 with food in upper esophagus complicated by aspiration event, cardiac arrest with round of CPR, and post resuscitation EGD with concern for lack of peristalsis. Hx prior EGD 08/2020 with note of abnormal cricopharyngeus, decrease in motility in esophagus, and spastic LES   5/10: Admit to [facility] service with sepsis due to Aspiration Pneumonia.  Course complicated by Acute Respiratory Failure due to Aspiration of vomitus w/ cardiac arrest  transfer to ICU and intubation.  5/11: flexible bronchoscopy  5/13: PEG tube, +vomiting last night, failed SAT/SBT 5/14: extubated but with severe delirium 5/20:  Botulinum toxin injection into the lower esophageal sphincter by Dr. Ole Berkeley 5/21: esophagram showing diffusely distended esophagus with distal obstruction and severe dysmotility suggestive of achalasia. 5/22: fall  5/23: Awaiting insurance authorization for rehab. 5/24: Tolerating some clear liquids 5/25: pulled out PEG tube this morning.  Foley catheter placed in the stoma.  GI able to place another PEG tube  5/26: Creatinine up at 1.83. Patient started having pain after tube feeding started.  CT showed that the PEG tube was not in the correct place.  Notified GI.  Started empiric antibiotics. 5/27:  To OR by Dr. Cornel Diesel - fever and elevated white count with suspected intra-abdominal peritonitis, re-placed feeding tube. 5/28: restart tube feeds  05/30: advancing tube feeds, remains on Zosyn   06/01-06/03: on and off AKI 06/04: family working on decision for SNF  06/07:  repeat CT abd d/t higher WBC, holding tube feeds but CT nothing acute  06/09: remains stable, pending SNF auth. New melena  06/10: GI repeat EGD, no bleeding  06/11: GI nothing else to add and HH stable GI has s/o, Cr up a bit, increasing free water  06/12: nephrology consult - restart IV fluids and monitor  06/14: Cr continues to worsen despite fluids via peg and iv, WBC increasing, not meeting sirs/sepsis but will workup for infection.  06/15: Cr substantially worse, complicated course today re: communication w/ family and medical team plan/goals - see progress note for details. In short, renal failure, was comfort measures then this was reversed back to aggressive treatment / full code. Watch renal fxn into tomorrow and decision at that time for dialysis or not 06/16: discussed at length w/ nephro and family, plan initiate HD today. Vasc placed temp cath. Per urology hydronephrosis on CT chronic/stable and no intervention plan (did not formally consult) 06/17: dialysis #2    Current consultants following as of today 11/12/23:  Nephrology Vascular surgery        ASSESSMENT & PLAN:    Current/acute problems:   AKI (acute kidney injury) with possible progression to ckd 3b/4 or to renal failure  Fluid overload  Associated hyperkalemia, hyponatremia, metabolic acidosis w/ compensatory hyperventilation which has improved w/ dislysis   Initiate dialysis 06/16 - continue per nephrology  Monitor renal fxn closely Monitor UOP, Foley for strict monitoring   Goals of care  Son, [name] will be the primary and only point of contact and we will not discuss separately with granddaughter Danelle Dunning unless she is on speaker phone  or conference call w/ [name] present  Re code status: [name] confirms that the patient would want CPR and life support if needed, even if unlikely to be successful. Reasoning is: if or when arrest occurs, if patient does not survive CPR etc, then at least everything has been  tried.  I have asked family to provide us  with HCPOA / advanced directive / living will paperwork asap. Particular attention to living will, if this is included. If pt has indicated wish for natural death, this may affect GOC discussions going forward if he is not a candidate for long-term dialysis or if he decompensates clinically. If he has indicated wish for any/all aggressive treatment, will stay the course.   Leukocytosis Abn UA No other infecitous source suspected Unasyn  to cover aspiration Await cultures --> BCx neg thus far, UCx (+)60K CFU Enterococcus   Decreased bowel sounds  Abd distention Question ileus  Holding tube feeds No N/V, but low threshold for NG if no BM and if nausea/vomiting develops Would slowly restart tomorrow   Hospital problems stable/resolved:   Melena - resolved  Anemia chronic disease  Repeat EGD 06/10 no concerns, GI has s/o HH has been stable Follow CBC    Severe sepsis - resolved  Initially w/ aspiration pna Later in hospital stay w/ intra-abdominal infection secondary to PEG tube not being in the right place See hospital course   Achalasia Dysphagia Aspiration risk is high  See hospital course  Status post botulinum toxin injection on 5/20.   tube feeds as able, holding for now as above  N.p.o. for the foreseeable future per GI - discussed risk/benefit w/ granddaughter. Advised granddaughter that if she decides for po, she must accept risk for aspiration  SLP to follow  Will need outpatient referral to tertiary advanced GI for consideration of POEMS procedure.  Recommend this only after a few weeks recovery period.    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  Underweight - under 18  overweight - 25 to 29 obese - 30 or more Class 1 obesity: BMI of 30.0 to 34 Class 2 obesity: BMI of 35.0 to 39 Class 3 obesity: BMI of 40.0 to 49 Super Morbid Obesity: BMI 50-59 Super-super Morbid Obesity: BMI 60+ Healthy nutrition and physical activity advised as adjunct to other disease management and risk reduction treatments    DVT prophylaxis: holding Rx Ppx w/ anemia/melena IV fluids: holding IV fluids  Nutrition: holding tube feeds for now [employer] / other devices: PEG, Foley, temp dialysis cath   Code Status: FULL CODE ACP documentation reviewed:  none on file in [facility] - have asked family to provide us  w/ docuemntation re HCPOA, LW, AD  TOC needs: placement Medical barriers to dispo: renal function. dialysis  Subjective / Brief ROS:  Patient reports 'I feel fine seen in dialysis suite  Asked about pain he said nope States no nausea States he feels breathing is fine .    Family Communication: will call later today pending clinical progression after AM dialysis     Objective Findings:  Vitals:   11/12/23 1124 11/12/23 1126 11/12/23 1133 11/12/23 1217  BP: 92/62  (!) 107/59 127/63  Pulse: 85  84 91  Resp: 17 (!) 24 (!) 27 (!) 25  Temp: 98.6 F (37 C)   98.4 F (36.9 C)  TempSrc: Oral   Oral  SpO2: 90% 97% 97% 95%  Weight:      Height:        Intake/Output Summary (Last 24 hours) at 11/12/2023 1317 Last data  filed at 11/12/2023 1133 Gross per 24 hour  Intake 189.5 ml  Output 1500 ml  Net -1310.5 ml   Filed Weights   11/11/23 2040 11/12/23 0248 11/12/23 0828  Weight: 105 kg 105.5 kg 103 kg    Examination:  Physical Exam Constitutional:      General: He is not in acute distress.    Appearance: He is not ill-appearing.   Cardiovascular:     Rate and Rhythm: Normal rate and regular rhythm.  Pulmonary:     Effort: Pulmonary effort is normal.     Comments: Bronchial breath sounds / mild rhonchi has been stable, faint expiratory wheeze. Resp rate normal today  Abdominal:     General: There is distension.     Palpations: There is no mass.     Tenderness: There is no abdominal tenderness. There is no guarding or rebound.     Comments: Reduced bowel sounds    Musculoskeletal:     Right lower leg: Edema (trace) present.     Left lower leg: Edema (trace) present.   Neurological:     Mental Status: He is alert. Mental status is at baseline. He is disoriented.   Psychiatric:        Behavior: Behavior normal.          Scheduled Medications:   Chlorhexidine  Gluconate Cloth  6 each Topical Daily   Chlorhexidine  Gluconate Cloth  6 each Topical Q0600   gentamicin  ointment   Topical TID   insulin  aspart  10 Units Subcutaneous Once   pantoprazole  (PROTONIX ) IV  40 mg Intravenous Q12H   sodium zirconium cyclosilicate  5 g Per Tube Daily    Continuous Infusions:  ampicillin -sulbactam (UNASYN ) IV 3 g (11/12/23 0633)    PRN Medications:  acetaminophen  **OR** acetaminophen , artificial tears, bisacodyl , diphenhydrAMINE, ipratropium-albuterol , [DISCONTINUED] ondansetron  **OR** ondansetron  (ZOFRAN ) IV, mouth rinse, ziprasidone  Antimicrobials from admission:  Anti-infectives (From admission, onward)    Start     Dose/Rate Route Frequency Ordered Stop   11/11/23 1815  Ampicillin -Sulbactam (UNASYN ) 3 g in sodium chloride  0.9 % 100 mL IVPB        3 g 200 mL/hr over 30 Minutes  Intravenous Every 12 hours 11/11/23 1726     11/10/23 1715  cefTRIAXone  (ROCEPHIN ) 1 g in sodium chloride  0.9 % 100 mL IVPB  Status:  Discontinued        1 g 200 mL/hr over 30 Minutes Intravenous Every 24 hours 11/10/23 1623 11/11/23 1725   10/21/23 2200  Ampicillin -Sulbactam (UNASYN ) 3 g in sodium chloride  0.9 % 100 mL IVPB  Status:  Discontinued        3 g 200 mL/hr over 30 Minutes Intravenous Every 6 hours  10/21/23 2011 10/21/23 2014   10/21/23 2200  piperacillin -tazobactam (ZOSYN ) IVPB 3.375 g        3.375 g 12.5 mL/hr over 240 Minutes Intravenous Every 8 hours 10/21/23 2019 10/26/23 2359   10/06/23 1600  cefTRIAXone  (ROCEPHIN ) 2 g in sodium chloride  0.9 % 100 mL IVPB        2 g 200 mL/hr over 30 Minutes Intravenous Every 24 hours 10/06/23 1415 10/09/23 1749   10/06/23 0600  piperacillin -tazobactam (ZOSYN ) IVPB 3.375 g  Status:  Discontinued        3.375 g 12.5 mL/hr over 240 Minutes Intravenous Every 8 hours 10/06/23 0356 10/06/23 1415   10/06/23 0500  cefTRIAXone  (ROCEPHIN ) 2 g in sodium chloride  0.9 % 100 mL IVPB  Status:  Discontinued        2 g 200 mL/hr over 30 Minutes Intravenous Every 24 hours 10/05/23 0750 10/06/23 0341   10/06/23 0500  azithromycin  (ZITHROMAX ) 500 mg in sodium chloride  0.9 % 250 mL IVPB  Status:  Discontinued        500 mg 250 mL/hr over 60 Minutes Intravenous Every 24 hours 10/05/23 0750 10/06/23 1409   10/05/23 0615  metroNIDAZOLE  (FLAGYL ) IVPB 500 mg        500 mg 100 mL/hr over 60 Minutes Intravenous  Once 10/05/23 0611 10/05/23 0910   10/05/23 0545  cefTRIAXone  (ROCEPHIN ) 2 g in sodium chloride  0.9 % 100 mL IVPB        2 g 200 mL/hr over 30 Minutes Intravenous Once 10/05/23 0533 10/05/23 0650   10/05/23 0545  azithromycin  (ZITHROMAX ) 500 mg in sodium chloride  0.9 % 250 mL IVPB        500 mg 250 mL/hr over 60 Minutes Intravenous  Once 10/05/23 0533 10/05/23 0805           Data Reviewed:  I have personally reviewed the  following...  CBC: Recent Labs  Lab 11/08/23 0529 11/09/23 0404 11/10/23 1544 11/11/23 0403 11/12/23 0357  WBC 13.4* 15.4* 17.4* 17.1* 16.1*  HGB 8.5* 8.3* 8.4* 8.0* 7.6*  HCT 26.5* 25.8* 25.7* 24.2* 23.4*  MCV 95.7 95.2 92.1 91.3 91.8  PLT 340 346 365 374 333   Basic Metabolic Panel: Recent Labs  Lab 11/09/23 0404 11/10/23 0537 11/10/23 1544 11/11/23 0403 11/11/23 1853 11/12/23 0357 11/12/23 0739  NA 145 135 133* 136 134*  --  133*  K 4.6 5.4* 5.9* 6.3* 7.0*  --  5.9*  CL 110 101 101 101 100  --  96*  CO2 25 24 22  21* 19*  --  25  GLUCOSE 118* 144* 106* 130* 116*  --  104*  BUN 60* 85* 101* 107* 115*  --  89*  CREATININE 2.73* 4.90* 5.72* 6.48* 7.67*  --  6.21*  CALCIUM 7.7* 7.3* 7.3* 7.4* 7.7*  --  7.4*  MG 2.5*  --   --   --   --  2.2  --   PHOS 4.7*  --   --   --   --   --  7.1*   GFR: Estimated Creatinine Clearance: 10.5 mL/min (A) (by C-G formula based on SCr of 6.21 mg/dL (H)). Liver Function Tests: Recent Labs  Lab 11/10/23 1544 11/12/23 0739  AST 73*  --   ALT 84*  --   ALKPHOS 92  --   BILITOT 0.8  --   PROT 6.4*  --   ALBUMIN 1.9* 1.7*   No results for input(s): LIPASE, AMYLASE in the last 168 hours.  No results for input(s): AMMONIA in the last 168 hours. Coagulation Profile: No results for input(s): INR, PROTIME in the last 168 hours. Cardiac Enzymes: No results for input(s): CKTOTAL, CKMB, CKMBINDEX, TROPONINI in the last 168 hours. BNP (last 3 results) No results for input(s): PROBNP in the last 8760 hours. HbA1C: No results for input(s): HGBA1C in the last 72 hours. CBG: Recent Labs  Lab 11/11/23 1645 11/11/23 1653 11/11/23 2236 11/12/23 0756 11/12/23 1208  GLUCAP 102* 107* 107* 105* 106*   Lipid Profile: No results for input(s): CHOL, HDL, LDLCALC, TRIG, CHOLHDL, LDLDIRECT in the last 72 hours. Thyroid  Function Tests: No results for input(s): TSH, T4TOTAL, FREET4, T3FREE, THYROIDAB in  the last 72 hours. Anemia Panel: No results for input(s): VITAMINB12, FOLATE, FERRITIN, TIBC, IRON, RETICCTPCT in the last 72 hours.  Most Recent Urinalysis On File:     Component Value Date/Time   COLORURINE YELLOW (A) 11/10/2023 0158   APPEARANCEUR HAZY (A) 11/10/2023 0158   LABSPEC 1.010 11/10/2023 0158   PHURINE 6.0 11/10/2023 0158   GLUCOSEU NEGATIVE 11/10/2023 0158   HGBUR NEGATIVE 11/10/2023 0158   BILIRUBINUR NEGATIVE 11/10/2023 0158   KETONESUR NEGATIVE 11/10/2023 0158   PROTEINUR 100 (A) 11/10/2023 0158   NITRITE NEGATIVE 11/10/2023 0158   LEUKOCYTESUR TRACE (A) 11/10/2023 0158   Sepsis Labs: @LABRCNTIP (procalcitonin:4,lacticidven:4) Microbiology: Recent Results (from the past 240 hours)  Resp panel by RT-PCR (RSV, Flu A&B, Covid) Anterior Nasal Swab     Status: None   Collection Time: 11/09/23  5:31 PM   Specimen: Anterior Nasal Swab  Result Value Ref Range Status   SARS Coronavirus 2 by RT PCR NEGATIVE NEGATIVE Final    Comment: (NOTE) SARS-CoV-2 target nucleic acids are NOT DETECTED.  The SARS-CoV-2 RNA is generally detectable in upper respiratory specimens during the acute phase of infection. The lowest concentration of SARS-CoV-2 viral copies this assay can detect is 138 copies/mL. A negative result does not preclude SARS-Cov-2 infection and should not be used as the sole basis for treatment or other patient management decisions. A negative result may occur with  improper specimen collection/handling, submission of specimen other than nasopharyngeal swab, presence of viral mutation(s) within the areas targeted by this assay, and inadequate number of viral copies(<138 copies/mL). A negative result must be combined with clinical observations, patient history, and epidemiological information. The expected result is Negative.  Fact Sheet for Patients:  BloggerCourse.com  Fact Sheet for Healthcare Providers:   SeriousBroker.it  This test is no t yet approved or cleared by the [place]  FDA and  has been authorized for detection and/or diagnosis of SARS-CoV-2 by FDA under an Emergency Use Authorization (EUA). This EUA will remain  in effect (meaning this test can be used) for the duration of the COVID-19 declaration under Section 564(b)(1) of the Act, 21 U.S.C.section 360bbb-3(b)(1), unless the authorization is terminated  or revoked sooner.       Influenza A by PCR NEGATIVE NEGATIVE Final   Influenza B by PCR NEGATIVE NEGATIVE Final    Comment: (NOTE) The Xpert Xpress SARS-CoV-2/FLU/RSV plus assay is intended as an aid in the diagnosis of influenza from Nasopharyngeal swab specimens and should not be used as a sole basis for treatment. Nasal washings and aspirates are unacceptable for Xpert Xpress SARS-CoV-2/FLU/RSV testing.  Fact Sheet for Patients: BloggerCourse.com  Fact Sheet for Healthcare Providers: SeriousBroker.it  This test is not yet approved or cleared by the [place]  FDA and has been authorized for detection and/or diagnosis of SARS-CoV-2 by  FDA under an Emergency Use Authorization (EUA). This EUA will remain in effect (meaning this test can be used) for the duration of the COVID-19 declaration under Section 564(b)(1) of the Act, 21 U.S.C. section 360bbb-3(b)(1), unless the authorization is terminated or revoked.     Resp Syncytial Virus by PCR NEGATIVE NEGATIVE Final    Comment: (NOTE) Fact Sheet for Patients: BloggerCourse.com  Fact Sheet for Healthcare Providers: SeriousBroker.it  This test is not yet approved or cleared by the [place]  FDA and has been authorized for detection and/or diagnosis of SARS-CoV-2 by FDA under an Emergency Use Authorization (EUA). This EUA will remain in effect (meaning this test can be used) for  the duration of the COVID-19 declaration under Section 564(b)(1) of the Act, 21 U.S.C. section 360bbb-3(b)(1), unless the authorization is terminated or revoked.  Performed at [facility], [address] [place], [place] [address]   Respiratory (~20 pathogens) panel by PCR     Status: None   Collection Time: 11/09/23  5:31 PM   Specimen: Nasopharyngeal Swab; Respiratory  Result Value Ref Range Status   Adenovirus NOT DETECTED NOT DETECTED Final   Coronavirus 229E NOT DETECTED NOT DETECTED Final    Comment: (NOTE) The Coronavirus on the Respiratory Panel, DOES NOT test for the novel  Coronavirus (2019 nCoV)    Coronavirus HKU1 NOT DETECTED NOT DETECTED Final   Coronavirus NL63 NOT DETECTED NOT DETECTED Final   Coronavirus OC43 NOT DETECTED NOT DETECTED Final   Metapneumovirus NOT DETECTED NOT DETECTED Final   Rhinovirus / Enterovirus NOT DETECTED NOT DETECTED Final   Influenza A NOT DETECTED NOT DETECTED Final   Influenza B NOT DETECTED NOT DETECTED Final   Parainfluenza Virus 1 NOT DETECTED NOT DETECTED Final   Parainfluenza Virus 2 NOT DETECTED NOT DETECTED Final   Parainfluenza Virus 3 NOT DETECTED NOT DETECTED Final   Parainfluenza Virus 4 NOT DETECTED NOT DETECTED Final   Respiratory Syncytial Virus NOT DETECTED NOT DETECTED Final   Bordetella pertussis NOT DETECTED NOT DETECTED Final   Bordetella Parapertussis NOT DETECTED NOT DETECTED Final   Chlamydophila pneumoniae NOT DETECTED NOT DETECTED Final   Mycoplasma pneumoniae NOT DETECTED NOT DETECTED Final    Comment: Performed at [facility], [address] [place], [place] [address]  Culture, blood (Routine X 2) w Reflex to ID Panel     Status: None (Preliminary result)   Collection Time: 11/09/23  6:16 PM   Specimen: BLOOD  Result Value Ref Range Status   Specimen Description BLOOD RIGHT ANTECUBITAL  Final   Special Requests   Final    BOTTLES DRAWN AEROBIC ONLY Blood Culture adequate volume    Culture   Final    NO GROWTH 3 DAYS Performed at [facility], [address] [place], [place] [address]    Report Status PENDING  Incomplete  Culture, blood (Routine X 2) w Reflex to ID Panel     Status: None (Preliminary result)   Collection Time: 11/09/23  6:23 PM   Specimen: BLOOD  Result Value Ref Range Status   Specimen Description BLOOD BLOOD LEFT FOREARM  Final   Special Requests   Final    BOTTLES DRAWN AEROBIC AND ANAEROBIC Blood Culture adequate volume   Culture   Final    NO GROWTH 3 DAYS Performed at [facility], [address] [place], [place] [address]    Report Status PENDING  Incomplete  Urine Culture     Status: Abnormal  Collection Time: 11/10/23  1:58 AM   Specimen: Urine, Random  Result Value Ref Range Status   Specimen Description   Final    URINE, RANDOM Performed at [facility], [address] [place], [place] [address]    Special Requests   Final    NONE Reflexed from [id number] Performed at [facility], [address] [place], [place] [address]    Culture 60,000 COLONIES/mL ENTEROCOCCUS FAECALIS (A)  Final   Report Status 11/12/2023 FINAL  Final   Organism ID, Bacteria ENTEROCOCCUS FAECALIS (A)  Final      Susceptibility   Enterococcus faecalis - MIC*    AMPICILLIN  <=2 SENSITIVE Sensitive     NITROFURANTOIN <=16 SENSITIVE Sensitive     VANCOMYCIN 1 SENSITIVE Sensitive     * 60,000 COLONIES/mL ENTEROCOCCUS FAECALIS      Radiology Studies last 3 days: DG Chest Port 1 View Result Date: 11/11/2023 CLINICAL DATA:  Tachypnea EXAM: PORTABLE CHEST 1 VIEW COMPARISON:  11/08/2023 FINDINGS: Single frontal view of the chest demonstrates a stable cardiac silhouette. Lung volumes are diminished, with crowding of the pulmonary vasculature. No airspace disease, effusion, or pneumothorax. No acute bony abnormalities. IMPRESSION: 1. Low lung volumes, no acute airspace disease. Electronically Signed   By: Bobbye Burrow  M.D.   On: 11/11/2023 17:54   PERIPHERAL VASCULAR CATHETERIZATION Result Date: 11/11/2023 See surgical note for result.  CT ABDOMEN PELVIS WO CONTRAST Result Date: 11/10/2023 CLINICAL DATA:  Gastrostomy, abdominal distension. EXAM: CT ABDOMEN AND PELVIS WITHOUT CONTRAST TECHNIQUE: Multidetector CT imaging of the abdomen and pelvis was performed following the standard protocol without IV contrast. RADIATION DOSE REDUCTION: This exam was performed according to the departmental dose-optimization program which includes automated exposure control, adjustment of the mA and/or kV according to patient size and/or use of iterative reconstruction technique. COMPARISON:  11/02/2023. FINDINGS: Lower chest: Right-sided small pleural effusion and right base consolidation or volume loss. Minimal subsegmental atelectasis left base. Atheromatous calcifications of coronary arteries. No pericardial effusion. Decompressed moderate-sized paraesophageal hiatal hernia. Hepatobiliary: No hepatic parenchymal abnormalities. Small amount of milk of calcium or tiny calcified stones in the gallbladder. Gallbladder is contracted. Pancreas: Unremarkable. No pancreatic ductal dilatation or surrounding inflammatory changes. Spleen: Normal in size without focal abnormality. Adrenals/Urinary Tract: No adrenal lesions. Bilateral hydronephrosis and hydroureter without evidence of stones. Urinary bladder is empty. Stomach/Bowel: Gastrostomy in the stomach. No bowel dilatation to suggest obstruction. Normal appendix. Vascular/Lymphatic: Aortic atherosclerosis. No enlarged abdominal or pelvic lymph nodes. Reproductive: Prostate is unremarkable. Other: No abdominal wall defects. There is fluid in the pararenal space as well as in the paracolic gutters. Edema or inflammatory changes represent an interval change from the prior study extending along the pericolic gutters bilaterally as well as in the lower abdominal retroperitoneum. Musculoskeletal: No  acute or significant osseous findings. IMPRESSION: 1. Right-sided pleural effusion and right base consolidation or volume loss. 2. Bilateral hydronephrosis and hydroureter without evidence of stones. 3. Fluid in the pararenal space and paracolic gutters. Inflammatory edematous changes extend into the pelvis in the presacral region. 4. Moderate-sized paraesophageal hiatal hernia. Gastrostomy appropriately positioned in the stomach. 5. Aortic atherosclerosis (ICD10-I70.0). Electronically Signed   By: Sydell Eva M.D.   On: 11/10/2023 10:14   DG Abd 1 View Result Date: 11/10/2023 EXAM: 1 VIEW XRAY OF THE ABDOMEN SUPINE 11/10/2023 12:08:49 AM COMPARISON: CT abdomen/pelvis dated 12/02/2023. CLINICAL HISTORY: Abdominal distension, constipation. FINDINGS: BOWEL: Nonobstructive bowel gas pattern. Residual contrast in the distal colon with suspected sigmoid diverticulosis.  PERITONEUM AND SOFT TISSUES: Suspected gastrostomy overlying the stomach. BONES: Mild degenerative changes of the right hip. No acute osseous abnormality. IMPRESSION: 1. No acute findings. 2. Suspected gastrostomy overlying the stomach. Electronically signed by: Zadie Herter MD 11/10/2023 12:15 AM EDT RP Workstation: [id number]     Time spent: 90 minutes    Melodi Sprung, DO [facility] 11/12/2023, 1:17 PM    Dictation software may have been used to generate the above note. Typos may occur and escape review in typed/dictated notes. Please contact Dr Authur Leghorn directly for clarity if needed.  Staff may message me via secure chat in Epic  but this may not receive an immediate response,  please page me for urgent matters!  If 7PM-7AM, please contact night coverage [email or web address]

## 2023-11-12 NOTE — Progress Notes (Signed)
 [facility]  ROUNDING NOTE   Subjective:   Patient is awake today.  He has mitten restraints.  Able to answer 1 or 2 simple questions.   Foley catheter in place with minimal urine.   Objective:  Vital signs in last 24 hours:  Temp:  [98.4 F (36.9 C)-99.1 F (37.3 C)] 98.4 F (36.9 C) (06/17 1217) Pulse Rate:  [80-105] 91 (06/17 1217) Resp:  [17-34] 25 (06/17 1217) BP: (90-136)/(59-86) 127/63 (06/17 1217) SpO2:  [86 %-100 %] 95 % (06/17 1217) Weight:  [103 kg-105.5 kg] 103 kg (06/17 0828)  Weight change: 0 kg Filed Weights   11/11/23 2040 11/12/23 0248 11/12/23 0828  Weight: 105 kg 105.5 kg 103 kg    Intake/Output: I/O last 3 completed shifts: In: 1526.2 [I.V.:558; NG/GT:718.3; IV Piggyback:250] Out: 500 [Other:500]   Intake/Output this shift:  Total I/O In: -  Out: 1000 [Other:1000]  Physical Exam: General: Chronically ill-appearing, uncomfortable at present  Head: Dry mucosal membranes  Eyes: Anicteric,  Lungs:   O2,  coarse breath sounds  Heart: Regular rate and rhythm  Abdomen:  Peg tube in place, midline incision, distended,   Extremities:  Trace peripheral edema.  Neurologic: Able to answer few simple questions  Skin: No lesions  Access: Femoral dialysis catheter  Foley catheter in place  Basic Metabolic Panel: Recent Labs  Lab 11/09/23 0404 11/10/23 0537 11/10/23 1544 11/11/23 0403 11/11/23 1853 11/12/23 0357 11/12/23 0739  NA 145 135 133* 136 134*  --  133*  K 4.6 5.4* 5.9* 6.3* 7.0*  --  5.9*  CL 110 101 101 101 100  --  96*  CO2 25 24 22  21* 19*  --  25  GLUCOSE 118* 144* 106* 130* 116*  --  104*  BUN 60* 85* 101* 107* 115*  --  89*  CREATININE 2.73* 4.90* 5.72* 6.48* 7.67*  --  6.21*  CALCIUM 7.7* 7.3* 7.3* 7.4* 7.7*  --  7.4*  MG 2.5*  --   --   --   --  2.2  --   PHOS 4.7*  --   --   --   --   --  7.1*    Liver Function Tests: Recent Labs  Lab 11/10/23 1544 11/12/23 0739  AST 73*  --   ALT 84*  --   ALKPHOS 92   --   BILITOT 0.8  --   PROT 6.4*  --   ALBUMIN 1.9* 1.7*   No results for input(s): LIPASE, AMYLASE in the last 168 hours. No results for input(s): AMMONIA in the last 168 hours.  CBC: Recent Labs  Lab 11/08/23 0529 11/09/23 0404 11/10/23 1544 11/11/23 0403 11/12/23 0357  WBC 13.4* 15.4* 17.4* 17.1* 16.1*  HGB 8.5* 8.3* 8.4* 8.0* 7.6*  HCT 26.5* 25.8* 25.7* 24.2* 23.4*  MCV 95.7 95.2 92.1 91.3 91.8  PLT 340 346 365 374 333    Cardiac Enzymes: No results for input(s): CKTOTAL, CKMB, CKMBINDEX, TROPONINI in the last 168 hours.  BNP: Invalid input(s): POCBNP  CBG: Recent Labs  Lab 11/11/23 1645 11/11/23 1653 11/11/23 2236 11/12/23 0756 11/12/23 1208  GLUCAP 102* 107* 107* 105* 106*    Microbiology: Results for orders placed or performed during the hospital encounter of 10/05/23  Blood Culture (routine x 2)     Status: None   Collection Time: 10/05/23  5:06 AM   Specimen: BLOOD  Result Value Ref Range Status   Specimen Description BLOOD LA  Final   Special  Requests   Final    BOTTLES DRAWN AEROBIC AND ANAEROBIC Blood Culture results may not be optimal due to an inadequate volume of blood received in culture bottles   Culture   Final    NO GROWTH 5 DAYS Performed at [facility], [address] [place], [place] [address]    Report Status 10/10/2023 FINAL  Final  Blood Culture (routine x 2)     Status: None   Collection Time: 10/05/23  5:07 AM   Specimen: BLOOD  Result Value Ref Range Status   Specimen Description BLOOD RA  Final   Special Requests   Final    BOTTLES DRAWN AEROBIC AND ANAEROBIC Blood Culture results may not be optimal due to an inadequate volume of blood received in culture bottles   Culture   Final    NO GROWTH 5 DAYS Performed at [facility], [address] [place], [place] [address]    Report Status 10/10/2023 FINAL  Final  Resp panel by RT-PCR (RSV, Flu A&B, Covid) Anterior Nasal Swab     Status:  None   Collection Time: 10/05/23  5:56 AM   Specimen: Anterior Nasal Swab  Result Value Ref Range Status   SARS Coronavirus 2 by RT PCR NEGATIVE NEGATIVE Final    Comment: (NOTE) SARS-CoV-2 target nucleic acids are NOT DETECTED.  The SARS-CoV-2 RNA is generally detectable in upper respiratory specimens during the acute phase of infection. The lowest concentration of SARS-CoV-2 viral copies this assay can detect is 138 copies/mL. A negative result does not preclude SARS-Cov-2 infection and should not be used as the sole basis for treatment or other patient management decisions. A negative result may occur with  improper specimen collection/handling, submission of specimen other than nasopharyngeal swab, presence of viral mutation(s) within the areas targeted by this assay, and inadequate number of viral copies(<138 copies/mL). A negative result must be combined with clinical observations, patient history, and epidemiological information. The expected result is Negative.  Fact Sheet for Patients:  BloggerCourse.com  Fact Sheet for Healthcare Providers:  SeriousBroker.it  This test is no t yet approved or cleared by the [place]  FDA and  has been authorized for detection and/or diagnosis of SARS-CoV-2 by FDA under an Emergency Use Authorization (EUA). This EUA will remain  in effect (meaning this test can be used) for the duration of the COVID-19 declaration under Section 564(b)(1) of the Act, 21 U.S.C.section 360bbb-3(b)(1), unless the authorization is terminated  or revoked sooner.       Influenza A by PCR NEGATIVE NEGATIVE Final   Influenza B by PCR NEGATIVE NEGATIVE Final    Comment: (NOTE) The Xpert Xpress SARS-CoV-2/FLU/RSV plus assay is intended as an aid in the diagnosis of influenza from Nasopharyngeal swab specimens and should not be used as a sole basis for treatment. Nasal washings and aspirates are unacceptable  for Xpert Xpress SARS-CoV-2/FLU/RSV testing.  Fact Sheet for Patients: BloggerCourse.com  Fact Sheet for Healthcare Providers: SeriousBroker.it  This test is not yet approved or cleared by the [place]  FDA and has been authorized for detection and/or diagnosis of SARS-CoV-2 by FDA under an Emergency Use Authorization (EUA). This EUA will remain in effect (meaning this test can be used) for the duration of the COVID-19 declaration under Section 564(b)(1) of the Act, 21 U.S.C. section 360bbb-3(b)(1), unless the authorization is terminated or revoked.     Resp Syncytial Virus by PCR NEGATIVE NEGATIVE Final    Comment: (NOTE) Fact Sheet for Patients: BloggerCourse.com  Fact Sheet for Healthcare Providers: SeriousBroker.it  This test is not yet approved or cleared by the [place]  FDA and has been authorized for detection and/or diagnosis of SARS-CoV-2 by FDA under an Emergency Use Authorization (EUA). This EUA will remain in effect (meaning this test can be used) for the duration of the COVID-19 declaration under Section 564(b)(1) of the Act, 21 U.S.C. section 360bbb-3(b)(1), unless the authorization is terminated or revoked.  Performed at [facility], [address] [place], [place] [address]   Respiratory (~20 pathogens) panel by PCR     Status: None   Collection Time: 10/05/23  8:11 AM   Specimen: Nasopharyngeal Swab; Respiratory  Result Value Ref Range Status   Adenovirus NOT DETECTED NOT DETECTED Final   Coronavirus 229E NOT DETECTED NOT DETECTED Final    Comment: (NOTE) The Coronavirus on the Respiratory Panel, DOES NOT test for the novel  Coronavirus (2019 nCoV)    Coronavirus HKU1 NOT DETECTED NOT DETECTED Final   Coronavirus NL63 NOT DETECTED NOT DETECTED Final   Coronavirus OC43 NOT DETECTED NOT DETECTED Final   Metapneumovirus NOT DETECTED NOT  DETECTED Final   Rhinovirus / Enterovirus NOT DETECTED NOT DETECTED Final   Influenza A NOT DETECTED NOT DETECTED Final   Influenza B NOT DETECTED NOT DETECTED Final   Parainfluenza Virus 1 NOT DETECTED NOT DETECTED Final   Parainfluenza Virus 2 NOT DETECTED NOT DETECTED Final   Parainfluenza Virus 3 NOT DETECTED NOT DETECTED Final   Parainfluenza Virus 4 NOT DETECTED NOT DETECTED Final   Respiratory Syncytial Virus NOT DETECTED NOT DETECTED Final   Bordetella pertussis NOT DETECTED NOT DETECTED Final   Bordetella Parapertussis NOT DETECTED NOT DETECTED Final   Chlamydophila pneumoniae NOT DETECTED NOT DETECTED Final   Mycoplasma pneumoniae NOT DETECTED NOT DETECTED Final    Comment: Performed at [facility], [address] [place], [place] [address]  Expectorated Sputum Assessment w Gram Stain, Rflx to Resp Cult     Status: None   Collection Time: 10/05/23  9:07 AM   Specimen: Sputum  Result Value Ref Range Status   Specimen Description SPUTUM  Final   Special Requests NONE  Final   Sputum evaluation   Final    Sputum specimen not acceptable for testing.  Please recollect.   C/KERRY NELSON AT 1005 10/05/23.PMF Performed at [facility], [address] [place], [place] [address]    Report Status 10/05/2023 FINAL  Final  Expectorated Sputum Assessment w Gram Stain, Rflx to Resp Cult     Status: None   Collection Time: 10/05/23 10:50 AM  Result Value Ref Range Status   Specimen Description EXPECTORATED SPUTUM  Final   Special Requests NONE  Final   Sputum evaluation   Final    THIS SPECIMEN IS ACCEPTABLE FOR SPUTUM CULTURE Performed at [facility], [address], [place], [place] [address]    Report Status 10/05/2023 FINAL  Final  Culture, Respiratory w Gram Stain     Status: None   Collection Time: 10/05/23 10:50 AM  Result Value Ref Range Status   Specimen Description   Final    EXPECTORATED SPUTUM Performed at [facility], [address] [place], [place] [address]    Special Requests   Final    NONE Reflexed from [id number] Performed at [facility], [address] [place], [place] [address]    Gram Stain   Final    RARE WBC SEEN RARE GRAM POSITIVE  RODS RARE GRAM POSITIVE COCCI RARE GRAM NEGATIVE RODS    Culture   Final    FEW Normal respiratory flora-no Staph aureus or Pseudomonas seen Performed at [facility], [address], [place], [place] [address]    Report Status 10/07/2023 FINAL  Final  MRSA Next Gen by PCR, Nasal     Status: None   Collection Time: 10/06/23 12:57 AM   Specimen: Nasal Mucosa; Nasal Swab  Result Value Ref Range Status   MRSA by PCR Next Gen NOT DETECTED NOT DETECTED Final    Comment: (NOTE) The GeneXpert MRSA Assay (FDA approved for NASAL specimens only), is one component of a comprehensive MRSA colonization surveillance program. It is not intended to diagnose MRSA infection nor to guide or monitor treatment for MRSA infections. Test performance is not FDA approved in patients less than 27 years old. Performed at [facility], [address] [place], [place] [address]   Culture, Respiratory w Gram Stain     Status: None   Collection Time: 10/06/23 11:37 AM   Specimen: INDUCED SPUTUM  Result Value Ref Range Status   Specimen Description   Final    INDUCED SPUTUM Performed at [facility], [address], [place], [place] [address]    Special Requests   Final    NONE Performed at [facility], [address] [place], [place] [address]    Gram Stain   Final    FEW WBC PRESENT,BOTH PMN AND MONONUCLEAR FEW GRAM POSITIVE RODS    Culture   Final    MODERATE LACTOBACILLUS FERMENTUM Standardized susceptibility testing for this organism is not available. Performed at [facility], [address] [place], [place] [address]    Report Status 10/09/2023 FINAL  Final  Culture, blood (x 2)     Status: None   Collection Time:  10/22/23  1:00 AM   Specimen: BLOOD  Result Value Ref Range Status   Specimen Description BLOOD BLOOD RIGHT ARM  Final   Special Requests   Final    BOTTLES DRAWN AEROBIC AND ANAEROBIC Blood Culture adequate volume   Culture   Final    NO GROWTH 5 DAYS Performed at [facility], [address], [place], [place] [address]    Report Status 10/27/2023 FINAL  Final  Culture, blood (x 2)     Status: None   Collection Time: 10/22/23  1:00 AM   Specimen: BLOOD  Result Value Ref Range Status   Specimen Description BLOOD BLOOD RIGHT ARM  Final   Special Requests   Final    BOTTLES DRAWN AEROBIC AND ANAEROBIC Blood Culture adequate volume   Culture   Final    NO GROWTH 5 DAYS Performed at [facility], [address] [place], [place] [address]    Report Status 10/27/2023 FINAL  Final  Resp panel by RT-PCR (RSV, Flu A&B, Covid) Anterior Nasal Swab     Status: None   Collection Time: 11/09/23  5:31 PM   Specimen: Anterior Nasal Swab  Result Value Ref Range Status   SARS Coronavirus 2 by RT PCR NEGATIVE NEGATIVE Final    Comment: (NOTE) SARS-CoV-2 target nucleic acids are NOT DETECTED.  The SARS-CoV-2 RNA is generally detectable in upper respiratory specimens during the acute phase of infection. The lowest concentration of SARS-CoV-2 viral copies this assay can detect is 138 copies/mL. A negative result does not preclude SARS-Cov-2 infection and should not be used as the sole basis  for treatment or other patient management decisions. A negative result may occur with  improper specimen collection/handling, submission of specimen other than nasopharyngeal swab, presence of viral mutation(s) within the areas targeted by this assay, and inadequate number of viral copies(<138 copies/mL). A negative result must be combined with clinical observations, patient history, and epidemiological information. The expected result is Negative.  Fact Sheet for Patients:   BloggerCourse.com  Fact Sheet for Healthcare Providers:  SeriousBroker.it  This test is no t yet approved or cleared by the [place]  FDA and  has been authorized for detection and/or diagnosis of SARS-CoV-2 by FDA under an Emergency Use Authorization (EUA). This EUA will remain  in effect (meaning this test can be used) for the duration of the COVID-19 declaration under Section 564(b)(1) of the Act, 21 U.S.C.section 360bbb-3(b)(1), unless the authorization is terminated  or revoked sooner.       Influenza A by PCR NEGATIVE NEGATIVE Final   Influenza B by PCR NEGATIVE NEGATIVE Final    Comment: (NOTE) The Xpert Xpress SARS-CoV-2/FLU/RSV plus assay is intended as an aid in the diagnosis of influenza from Nasopharyngeal swab specimens and should not be used as a sole basis for treatment. Nasal washings and aspirates are unacceptable for Xpert Xpress SARS-CoV-2/FLU/RSV testing.  Fact Sheet for Patients: BloggerCourse.com  Fact Sheet for Healthcare Providers: SeriousBroker.it  This test is not yet approved or cleared by the [place]  FDA and has been authorized for detection and/or diagnosis of SARS-CoV-2 by FDA under an Emergency Use Authorization (EUA). This EUA will remain in effect (meaning this test can be used) for the duration of the COVID-19 declaration under Section 564(b)(1) of the Act, 21 U.S.C. section 360bbb-3(b)(1), unless the authorization is terminated or revoked.     Resp Syncytial Virus by PCR NEGATIVE NEGATIVE Final    Comment: (NOTE) Fact Sheet for Patients: BloggerCourse.com  Fact Sheet for Healthcare Providers: SeriousBroker.it  This test is not yet approved or cleared by the [place]  FDA and has been authorized for detection and/or diagnosis of SARS-CoV-2 by FDA under an Emergency Use  Authorization (EUA). This EUA will remain in effect (meaning this test can be used) for the duration of the COVID-19 declaration under Section 564(b)(1) of the Act, 21 U.S.C. section 360bbb-3(b)(1), unless the authorization is terminated or revoked.  Performed at [facility], [address] [place], [place] [address]   Respiratory (~20 pathogens) panel by PCR     Status: None   Collection Time: 11/09/23  5:31 PM   Specimen: Nasopharyngeal Swab; Respiratory  Result Value Ref Range Status   Adenovirus NOT DETECTED NOT DETECTED Final   Coronavirus 229E NOT DETECTED NOT DETECTED Final    Comment: (NOTE) The Coronavirus on the Respiratory Panel, DOES NOT test for the novel  Coronavirus (2019 nCoV)    Coronavirus HKU1 NOT DETECTED NOT DETECTED Final   Coronavirus NL63 NOT DETECTED NOT DETECTED Final   Coronavirus OC43 NOT DETECTED NOT DETECTED Final   Metapneumovirus NOT DETECTED NOT DETECTED Final   Rhinovirus / Enterovirus NOT DETECTED NOT DETECTED Final   Influenza A NOT DETECTED NOT DETECTED Final   Influenza B NOT DETECTED NOT DETECTED Final   Parainfluenza Virus 1 NOT DETECTED NOT DETECTED Final   Parainfluenza Virus 2 NOT DETECTED NOT DETECTED Final   Parainfluenza Virus 3 NOT DETECTED NOT DETECTED Final   Parainfluenza Virus 4 NOT DETECTED NOT DETECTED Final   Respiratory Syncytial Virus NOT DETECTED NOT DETECTED Final   Bordetella pertussis  NOT DETECTED NOT DETECTED Final   Bordetella Parapertussis NOT DETECTED NOT DETECTED Final   Chlamydophila pneumoniae NOT DETECTED NOT DETECTED Final   Mycoplasma pneumoniae NOT DETECTED NOT DETECTED Final    Comment: Performed at [facility], [address] [place], [place] [address]  Culture, blood (Routine X 2) w Reflex to ID Panel     Status: None (Preliminary result)   Collection Time: 11/09/23  6:16 PM   Specimen: BLOOD  Result Value Ref Range Status   Specimen Description BLOOD RIGHT ANTECUBITAL  Final    Special Requests   Final    BOTTLES DRAWN AEROBIC ONLY Blood Culture adequate volume   Culture   Final    NO GROWTH 3 DAYS Performed at [facility], [address] [place], [place] [address]    Report Status PENDING  Incomplete  Culture, blood (Routine X 2) w Reflex to ID Panel     Status: None (Preliminary result)   Collection Time: 11/09/23  6:23 PM   Specimen: BLOOD  Result Value Ref Range Status   Specimen Description BLOOD BLOOD LEFT FOREARM  Final   Special Requests   Final    BOTTLES DRAWN AEROBIC AND ANAEROBIC Blood Culture adequate volume   Culture   Final    NO GROWTH 3 DAYS Performed at [facility], [address], [place], [place] [address]    Report Status PENDING  Incomplete  Urine Culture     Status: Abnormal   Collection Time: 11/10/23  1:58 AM   Specimen: Urine, Random  Result Value Ref Range Status   Specimen Description   Final    URINE, RANDOM Performed at [facility], [address], [place], [place] [address]    Special Requests   Final    NONE Reflexed from [id number] Performed at [facility], [address] [place], [place] [address]    Culture 60,000 COLONIES/mL ENTEROCOCCUS FAECALIS (A)  Final   Report Status 11/12/2023 FINAL  Final   Organism ID, Bacteria ENTEROCOCCUS FAECALIS (A)  Final      Susceptibility   Enterococcus faecalis - MIC*    AMPICILLIN  <=2 SENSITIVE Sensitive     NITROFURANTOIN <=16 SENSITIVE Sensitive     VANCOMYCIN 1 SENSITIVE Sensitive     * 60,000 COLONIES/mL ENTEROCOCCUS FAECALIS    Coagulation Studies: No results for input(s): LABPROT, INR in the last 72 hours.  Urinalysis: Recent Labs    11/10/23 0158  COLORURINE YELLOW*  LABSPEC 1.010  PHURINE 6.0  GLUCOSEU NEGATIVE  HGBUR NEGATIVE  BILIRUBINUR NEGATIVE  KETONESUR NEGATIVE  PROTEINUR 100*  NITRITE NEGATIVE  LEUKOCYTESUR TRACE*      Imaging: DG Chest Port 1 View Result Date: 11/11/2023 CLINICAL DATA:   Tachypnea EXAM: PORTABLE CHEST 1 VIEW COMPARISON:  11/08/2023 FINDINGS: Single frontal view of the chest demonstrates a stable cardiac silhouette. Lung volumes are diminished, with crowding of the pulmonary vasculature. No airspace disease, effusion, or pneumothorax. No acute bony abnormalities. IMPRESSION: 1. Low lung volumes, no acute airspace disease. Electronically Signed   By: Bobbye Burrow M.D.   On: 11/11/2023 17:54   PERIPHERAL VASCULAR CATHETERIZATION Result Date: 11/11/2023 See surgical note for result.    Medications:    ampicillin -sulbactam (UNASYN ) IV 3 g (11/12/23 0633)    Chlorhexidine  Gluconate Cloth  6 each Topical Daily   Chlorhexidine  Gluconate Cloth  6 each Topical Q0600   gentamicin  ointment   Topical TID   insulin  aspart  10 Units Subcutaneous Once  pantoprazole  (PROTONIX ) IV  40 mg Intravenous Q12H   sodium zirconium cyclosilicate  5 g Per Tube Daily   acetaminophen  **OR** acetaminophen , artificial tears, bisacodyl , diphenhydrAMINE, ipratropium-albuterol , [DISCONTINUED] ondansetron  **OR** ondansetron  (ZOFRAN ) IV, mouth rinse, ziprasidone  Assessment/ Plan:  Mr. Robert Vega is a 83 y.o.  male with obstructive sleep apnea, GERD, obesity, hypertension, dysphagia, severe esophageal dysmotility with achalasia status post PEG tube placement, recent aspiration pneumonia acute respiratory failure status post extubation 10/09/2023, who was admitted to [facility] on 10/05/2023 for Aspiration into respiratory tract, initial encounter [T17.908A] Severe sepsis (HCC) [A41.9, R65.20] History of dysphagia [Z87.898] Fever, unspecified fever cause [R50.9] Multifocal pneumonia [J18.9]   1.  Acute kidney injury .  Baseline creatinine 0.81 from 10/14/2023.  Acute kidney injury secondary to ATN, obstructive uropathy, and progression of prerenal azotemia.   Patient is critically ill.  He was started on urgent hemodialysis for severe hyperkalemia and uremia. -Plan for short-term dialysis to  see if clearing of uremia and restoration of electrolyte balance helps improved clinical condition. -Patient's son reports that patient was independent and quite functional prior to admission. -Hemodialysis was started on Monday.  Had second treatment Tuesday.  Next treatment planned for Wednesday.   2.  Severe hyperkalemia-improved with hemodialysis to 5.9 today.  Expected to improve further after today's dialysis.   3.  Originally admitted for sepsis due to aspiration pneumonia, found to have distal esophageal obstruction with severe dysmotility.  He is status post bottle on toxin injection to the lower esophageal sphincter.  PEG tube placed this admission.  Tube feeds currently on hold due to concern of Ileus.  4.Anemia in the setting of renal failure - Most recent hemoglobin 7.6 and trending lower - Will monitor closely and consider ESA. - Blood transfusion as per primary team.  5.  Bilateral hydronephrosis Noted on CT.Urology team has evaluated the patient and it is thought to be secondary to reflux.  No intervention at this time due to fragile health.     LOS: 38 Tabytha Gradillas 6/17/20251:49 PM

## 2023-11-12 NOTE — Telephone Encounter (Signed)
 Copied from CRM [id number]. Topic: General - Other >> Nov 12, 2023  2:52 PM Laquanda P wrote: Reason for CRM: Pt friend just calling to update PCP that pt is still in hospital , now is on dialysis , not looking too good. She can be reached [phone]

## 2023-11-12 NOTE — Plan of Care (Signed)

## 2023-11-13 DIAGNOSIS — N179 Acute kidney failure, unspecified: Secondary | ICD-10-CM | POA: Diagnosis not present

## 2023-11-13 DIAGNOSIS — A419 Sepsis, unspecified organism: Secondary | ICD-10-CM | POA: Diagnosis not present

## 2023-11-13 DIAGNOSIS — R6520 Severe sepsis without septic shock: Secondary | ICD-10-CM | POA: Diagnosis not present

## 2023-11-13 LAB — COMPREHENSIVE METABOLIC PANEL WITH GFR
ALT: 76 U/L — ABNORMAL HIGH (ref 0–44)
AST: 65 U/L — ABNORMAL HIGH (ref 15–41)
Albumin: 1.7 g/dL — ABNORMAL LOW (ref 3.5–5.0)
Alkaline Phosphatase: 92 U/L (ref 38–126)
Anion gap: 12 (ref 5–15)
BUN: 69 mg/dL — ABNORMAL HIGH (ref 8–23)
CO2: 25 mmol/L (ref 22–32)
Calcium: 7.4 mg/dL — ABNORMAL LOW (ref 8.9–10.3)
Chloride: 97 mmol/L — ABNORMAL LOW (ref 98–111)
Creatinine, Ser: 5.26 mg/dL — ABNORMAL HIGH (ref 0.61–1.24)
GFR, Estimated: 10 mL/min — ABNORMAL LOW (ref >60)
Glucose, Bld: 122 mg/dL — ABNORMAL HIGH (ref 70–99)
Potassium: 4.8 mmol/L (ref 3.5–5.1)
Sodium: 134 mmol/L — ABNORMAL LOW (ref 135–145)
Total Bilirubin: 0.5 mg/dL (ref 0.0–1.2)
Total Protein: 6.3 g/dL — ABNORMAL LOW (ref 6.5–8.1)

## 2023-11-13 LAB — CBC
HCT: 21 % — ABNORMAL LOW (ref 39.0–52.0)
Hemoglobin: 6.7 g/dL — ABNORMAL LOW (ref 13.0–17.0)
MCH: 29.4 pg (ref 26.0–34.0)
MCHC: 31.9 g/dL (ref 30.0–36.0)
MCV: 92.1 fL (ref 80.0–100.0)
Platelets: 309 10*3/uL (ref 150–400)
RBC: 2.28 MIL/uL — ABNORMAL LOW (ref 4.22–5.81)
RDW: 14.5 % (ref 11.5–15.5)
WBC: 11.4 10*3/uL — ABNORMAL HIGH (ref 4.0–10.5)
nRBC: 0 % (ref 0.0–0.2)

## 2023-11-13 LAB — HEPATITIS B SURFACE ANTIBODY, QUANTITATIVE: Hep B S AB Quant (Post): <3.5 m[IU]/mL — ABNORMAL LOW

## 2023-11-13 LAB — MAGNESIUM: Magnesium: 2.1 mg/dL (ref 1.7–2.4)

## 2023-11-13 LAB — GLUCOSE, CAPILLARY
Glucose-Capillary: 107 mg/dL — ABNORMAL HIGH (ref 70–99)
Glucose-Capillary: 111 mg/dL — ABNORMAL HIGH (ref 70–99)
Glucose-Capillary: 112 mg/dL — ABNORMAL HIGH (ref 70–99)
Glucose-Capillary: 121 mg/dL — ABNORMAL HIGH (ref 70–99)

## 2023-11-13 LAB — PREPARE RBC (CROSSMATCH)

## 2023-11-13 MED ORDER — HEPARIN SODIUM (PORCINE) 5000 UNIT/ML IJ SOLN
5000.0000 [IU] | Freq: Three times a day (TID) | INTRAMUSCULAR | Status: DC
  Administered 2023-11-13 – 2023-12-16 (×97): 5000 [IU] via SUBCUTANEOUS
  Filled 2023-11-13 (×97): qty 1

## 2023-11-13 MED ORDER — SODIUM CHLORIDE 0.9% IV SOLUTION
Freq: Once | INTRAVENOUS | Status: DC
Start: 2023-11-13 — End: 2023-12-16

## 2023-11-13 MED ORDER — OSMOLITE 1.5 CAL PO LIQD
1000.0000 mL | ORAL | Status: AC
  Administered 2023-11-15 – 2023-11-18 (×3): 1000 mL

## 2023-11-13 NOTE — Plan of Care (Signed)
  Problem: Education: Goal: Knowledge of General Education information will improve Description: Including pain rating scale, medication(s)/side effects and non-pharmacologic comfort measures Outcome: Progressing   Problem: Health Behavior/Discharge Planning: Goal: Ability to manage health-related needs will improve Outcome: Progressing   Problem: Clinical Measurements: Goal: Ability to maintain clinical measurements within normal limits will improve Outcome: Progressing Goal: Will remain free from infection Outcome: Progressing Goal: Diagnostic test results will improve Outcome: Progressing Goal: Respiratory complications will improve Outcome: Progressing Goal: Cardiovascular complication will be avoided Outcome: Progressing   Problem: Activity: Goal: Risk for activity intolerance will decrease Outcome: Progressing   Problem: Nutrition: Goal: Adequate nutrition will be maintained Outcome: Progressing   Problem: Elimination: Goal: Will not experience complications related to bowel motility Outcome: Progressing Goal: Will not experience complications related to urinary retention Outcome: Progressing   Problem: Coping: Goal: Level of anxiety will decrease Outcome: Progressing   Problem: Pain Managment: Goal: General experience of comfort will improve and/or be controlled Outcome: Progressing   Problem: Safety: Goal: Ability to remain free from injury will improve Outcome: Progressing

## 2023-11-13 NOTE — Telephone Encounter (Signed)
 Discussed what is going on with her. Certainly the prognosis is not good--but he did want to have reasonable life support. May be appropriate to stop dialysis if his condition doesn't improve in the near future

## 2023-11-13 NOTE — Progress Notes (Signed)
 [facility]  ROUNDING NOTE   Subjective:   Patient was seen and evaluated earlier in the day during hemodialysis treatment. He was found to be tolerating well. Hand mittens in place.   Objective:  Vital signs in last 24 hours:  Temp:  [97.7 F (36.5 C)-99.2 F (37.3 C)] 98.2 F (36.8 C) (06/18 1452) Pulse Rate:  [70-92] 88 (06/18 1452) Resp:  [12-26] 20 (06/18 1452) BP: (101-138)/(56-83) 120/68 (06/18 1452) SpO2:  [95 %-100 %] 96 % (06/18 1452) Weight:  [102.1 kg] 102.1 kg (06/18 0500)  Weight change: -2 kg Filed Weights   11/12/23 0248 11/12/23 0828 11/13/23 0500  Weight: 105.5 kg 103 kg 102.1 kg    Intake/Output: I/O last 3 completed shifts: In: 566.7 [NG/GT:218; IV Piggyback:348.7] Out: 1500 [Other:1500]   Intake/Output this shift:  Total I/O In: 51.4 [IV Piggyback:51.4] Out: 1000 [Other:1000]  Physical Exam: General: Chronically ill-appearing  Head: Dry mucosal membranes  Eyes: Anicteri  Lungs:  [place] O2,  coarse breath sounds  Heart: Regular rate and rhythm  Abdomen:  Peg tube in place, midline incision, distended  Extremities: Trace peripheral edema.  Neurologic: Able to answer few simple questions  Skin: No lesions  Access: Femoral dialysis catheter  Foley catheter in place  Basic Metabolic Panel: Recent Labs  Lab 11/09/23 0404 11/10/23 0537 11/11/23 0403 11/11/23 1853 11/12/23 0357 11/12/23 0739 11/12/23 2305 11/13/23 0423  NA 145   < > 136 134*  --  133* 133* 134*  K 4.6   < > 6.3* 7.0*  --  5.9* 4.8 4.8  CL 110   < > 101 100  --  96* 96* 97*  CO2 25   < > 21* 19*  --  25 23 25   GLUCOSE 118*   < > 130* 116*  --  104* 103* 122*  BUN 60*   < > 107* 115*  --  89* 62* 69*  CREATININE 2.73*   < > 6.48* 7.67*  --  6.21* 4.94* 5.26*  CALCIUM 7.7*   < > 7.4* 7.7*  --  7.4* 7.2* 7.4*  MG 2.5*  --   --   --  2.2  --   --  2.1  PHOS 4.7*  --   --   --   --  7.1* 6.6*  --    < > = values in this interval not displayed.    Liver  Function Tests: Recent Labs  Lab 11/10/23 1544 11/12/23 0739 11/12/23 2305 11/13/23 0423  AST 73*  --   --  65*  ALT 84*  --   --  76*  ALKPHOS 92  --   --  92  BILITOT 0.8  --   --  0.5  PROT 6.4*  --   --  6.3*  ALBUMIN 1.9* 1.7* 1.6* 1.7*   No results for input(s): LIPASE, AMYLASE in the last 168 hours. No results for input(s): AMMONIA in the last 168 hours.  CBC: Recent Labs  Lab 11/09/23 0404 11/10/23 1544 11/11/23 0403 11/12/23 0357 11/13/23 0423  WBC 15.4* 17.4* 17.1* 16.1* 11.4*  HGB 8.3* 8.4* 8.0* 7.6* 6.7*  HCT 25.8* 25.7* 24.2* 23.4* 21.0*  MCV 95.2 92.1 91.3 91.8 92.1  PLT 346 365 374 333 309    Cardiac Enzymes: No results for input(s): CKTOTAL, CKMB, CKMBINDEX, TROPONINI in the last 168 hours.  BNP: Invalid input(s): POCBNP  CBG: Recent Labs  Lab 11/12/23 1845 11/12/23 1920 11/13/23 0019 11/13/23 0403 11/13/23 1246  GLUCAP 99 92 112* 111* 107*    Microbiology: Results for orders placed or performed during the hospital encounter of 10/05/23  Blood Culture (routine x 2)     Status: None   Collection Time: 10/05/23  5:06 AM   Specimen: BLOOD  Result Value Ref Range Status   Specimen Description BLOOD LA  Final   Special Requests   Final    BOTTLES DRAWN AEROBIC AND ANAEROBIC Blood Culture results may not be optimal due to an inadequate volume of blood received in culture bottles   Culture   Final    NO GROWTH 5 DAYS Performed at [facility], [address] [place], [place] [address]    Report Status 10/10/2023 FINAL  Final  Blood Culture (routine x 2)     Status: None   Collection Time: 10/05/23  5:07 AM   Specimen: BLOOD  Result Value Ref Range Status   Specimen Description BLOOD RA  Final   Special Requests   Final    BOTTLES DRAWN AEROBIC AND ANAEROBIC Blood Culture results may not be optimal due to an inadequate volume of blood received in culture bottles   Culture   Final    NO GROWTH 5 DAYS Performed  at [facility], [address] [place], [place] [address]    Report Status 10/10/2023 FINAL  Final  Resp panel by RT-PCR (RSV, Flu A&B, Covid) Anterior Nasal Swab     Status: None   Collection Time: 10/05/23  5:56 AM   Specimen: Anterior Nasal Swab  Result Value Ref Range Status   SARS Coronavirus 2 by RT PCR NEGATIVE NEGATIVE Final    Comment: (NOTE) SARS-CoV-2 target nucleic acids are NOT DETECTED.  The SARS-CoV-2 RNA is generally detectable in upper respiratory specimens during the acute phase of infection. The lowest concentration of SARS-CoV-2 viral copies this assay can detect is 138 copies/mL. A negative result does not preclude SARS-Cov-2 infection and should not be used as the sole basis for treatment or other patient management decisions. A negative result may occur with  improper specimen collection/handling, submission of specimen other than nasopharyngeal swab, presence of viral mutation(s) within the areas targeted by this assay, and inadequate number of viral copies(<138 copies/mL). A negative result must be combined with clinical observations, patient history, and epidemiological information. The expected result is Negative.  Fact Sheet for Patients:  BloggerCourse.com  Fact Sheet for Healthcare Providers:  SeriousBroker.it  This test is no t yet approved or cleared by the [place]  FDA and  has been authorized for detection and/or diagnosis of SARS-CoV-2 by FDA under an Emergency Use Authorization (EUA). This EUA will remain  in effect (meaning this test can be used) for the duration of the COVID-19 declaration under Section 564(b)(1) of the Act, 21 U.S.C.section 360bbb-3(b)(1), unless the authorization is terminated  or revoked sooner.       Influenza A by PCR NEGATIVE NEGATIVE Final   Influenza B by PCR NEGATIVE NEGATIVE Final    Comment: (NOTE) The Xpert Xpress SARS-CoV-2/FLU/RSV plus assay  is intended as an aid in the diagnosis of influenza from Nasopharyngeal swab specimens and should not be used as a sole basis for treatment. Nasal washings and aspirates are unacceptable for Xpert Xpress SARS-CoV-2/FLU/RSV testing.  Fact Sheet for Patients: BloggerCourse.com  Fact Sheet for Healthcare Providers: SeriousBroker.it  This test is not yet approved or cleared by the [place]  FDA and has been authorized for detection and/or diagnosis of SARS-CoV-2 by FDA under  an Emergency Use Authorization (EUA). This EUA will remain in effect (meaning this test can be used) for the duration of the COVID-19 declaration under Section 564(b)(1) of the Act, 21 U.S.C. section 360bbb-3(b)(1), unless the authorization is terminated or revoked.     Resp Syncytial Virus by PCR NEGATIVE NEGATIVE Final    Comment: (NOTE) Fact Sheet for Patients: BloggerCourse.com  Fact Sheet for Healthcare Providers: SeriousBroker.it  This test is not yet approved or cleared by the [place]  FDA and has been authorized for detection and/or diagnosis of SARS-CoV-2 by FDA under an Emergency Use Authorization (EUA). This EUA will remain in effect (meaning this test can be used) for the duration of the COVID-19 declaration under Section 564(b)(1) of the Act, 21 U.S.C. section 360bbb-3(b)(1), unless the authorization is terminated or revoked.  Performed at [facility], [address] [place], [place] [address]   Respiratory (~20 pathogens) panel by PCR     Status: None   Collection Time: 10/05/23  8:11 AM   Specimen: Nasopharyngeal Swab; Respiratory  Result Value Ref Range Status   Adenovirus NOT DETECTED NOT DETECTED Final   Coronavirus 229E NOT DETECTED NOT DETECTED Final    Comment: (NOTE) The Coronavirus on the Respiratory Panel, DOES NOT test for the novel  Coronavirus (2019 nCoV)     Coronavirus HKU1 NOT DETECTED NOT DETECTED Final   Coronavirus NL63 NOT DETECTED NOT DETECTED Final   Coronavirus OC43 NOT DETECTED NOT DETECTED Final   Metapneumovirus NOT DETECTED NOT DETECTED Final   Rhinovirus / Enterovirus NOT DETECTED NOT DETECTED Final   Influenza A NOT DETECTED NOT DETECTED Final   Influenza B NOT DETECTED NOT DETECTED Final   Parainfluenza Virus 1 NOT DETECTED NOT DETECTED Final   Parainfluenza Virus 2 NOT DETECTED NOT DETECTED Final   Parainfluenza Virus 3 NOT DETECTED NOT DETECTED Final   Parainfluenza Virus 4 NOT DETECTED NOT DETECTED Final   Respiratory Syncytial Virus NOT DETECTED NOT DETECTED Final   Bordetella pertussis NOT DETECTED NOT DETECTED Final   Bordetella Parapertussis NOT DETECTED NOT DETECTED Final   Chlamydophila pneumoniae NOT DETECTED NOT DETECTED Final   Mycoplasma pneumoniae NOT DETECTED NOT DETECTED Final    Comment: Performed at [facility], [address], [place], [place] [address]  Expectorated Sputum Assessment w Gram Stain, Rflx to Resp Cult     Status: None   Collection Time: 10/05/23  9:07 AM   Specimen: Sputum  Result Value Ref Range Status   Specimen Description SPUTUM  Final   Special Requests NONE  Final   Sputum evaluation   Final    Sputum specimen not acceptable for testing.  Please recollect.   C/KERRY NELSON AT 1005 10/05/23.PMF Performed at [facility], [address] [place], [place] [address]    Report Status 10/05/2023 FINAL  Final  Expectorated Sputum Assessment w Gram Stain, Rflx to Resp Cult     Status: None   Collection Time: 10/05/23 10:50 AM  Result Value Ref Range Status   Specimen Description EXPECTORATED SPUTUM  Final   Special Requests NONE  Final   Sputum evaluation   Final    THIS SPECIMEN IS ACCEPTABLE FOR SPUTUM CULTURE Performed at [facility], [address], [place], [place] [address]    Report Status 10/05/2023 FINAL  Final  Culture, Respiratory w Gram Stain      Status: None   Collection Time: 10/05/23 10:50 AM  Result Value Ref Range Status   Specimen Description  Final    EXPECTORATED SPUTUM Performed at [facility], [address] [place], [place] [address]    Special Requests   Final    NONE Reflexed from [id number] Performed at [facility], [address] [place], [place] [address]    Gram Stain   Final    RARE WBC SEEN RARE Hillis Lu POSITIVE RODS RARE Hillis Lu POSITIVE COCCI RARE GRAM NEGATIVE RODS    Culture   Final    FEW Normal respiratory flora-no Staph aureus or Pseudomonas seen Performed at [facility], [address] [place], [place] [address]    Report Status 10/07/2023 FINAL  Final  MRSA Next Gen by PCR, Nasal     Status: None   Collection Time: 10/06/23 12:57 AM   Specimen: Nasal Mucosa; Nasal Swab  Result Value Ref Range Status   MRSA by PCR Next Gen NOT DETECTED NOT DETECTED Final    Comment: (NOTE) The GeneXpert MRSA Assay (FDA approved for NASAL specimens only), is one component of a comprehensive MRSA colonization surveillance program. It is not intended to diagnose MRSA infection nor to guide or monitor treatment for MRSA infections. Test performance is not FDA approved in patients less than 26 years old. Performed at [facility], [address] [place], [place] [address]   Culture, Respiratory w Gram Stain     Status: None   Collection Time: 10/06/23 11:37 AM   Specimen: INDUCED SPUTUM  Result Value Ref Range Status   Specimen Description   Final    INDUCED SPUTUM Performed at [facility], [address], [place], [place] [address]    Special Requests   Final    NONE Performed at [facility], [address] [place], [place] [address]    Gram Stain   Final    FEW WBC PRESENT,BOTH PMN AND MONONUCLEAR FEW GRAM POSITIVE RODS    Culture   Final    MODERATE LACTOBACILLUS FERMENTUM Standardized susceptibility testing for this organism is not  available. Performed at [facility], [address], [place], [place] [address]    Report Status 10/09/2023 FINAL  Final  Culture, blood (x 2)     Status: None   Collection Time: 10/22/23  1:00 AM   Specimen: BLOOD  Result Value Ref Range Status   Specimen Description BLOOD BLOOD RIGHT ARM  Final   Special Requests   Final    BOTTLES DRAWN AEROBIC AND ANAEROBIC Blood Culture adequate volume   Culture   Final    NO GROWTH 5 DAYS Performed at [facility], [address], [place], [place] [address]    Report Status 10/27/2023 FINAL  Final  Culture, blood (x 2)     Status: None   Collection Time: 10/22/23  1:00 AM   Specimen: BLOOD  Result Value Ref Range Status   Specimen Description BLOOD BLOOD RIGHT ARM  Final   Special Requests   Final    BOTTLES DRAWN AEROBIC AND ANAEROBIC Blood Culture adequate volume   Culture   Final    NO GROWTH 5 DAYS Performed at [facility], [address] [place], [place] [address]    Report Status 10/27/2023 FINAL  Final  Resp panel by RT-PCR (RSV, Flu A&B, Covid) Anterior Nasal Swab     Status: None   Collection Time: 11/09/23  5:31 PM   Specimen: Anterior Nasal Swab  Result Value Ref Range Status   SARS Coronavirus 2 by RT PCR NEGATIVE  NEGATIVE Final    Comment: (NOTE) SARS-CoV-2 target nucleic acids are NOT DETECTED.  The SARS-CoV-2 RNA is generally detectable in upper respiratory specimens during the acute phase of infection. The lowest concentration of SARS-CoV-2 viral copies this assay can detect is 138 copies/mL. A negative result does not preclude SARS-Cov-2 infection and should not be used as the sole basis for treatment or other patient management decisions. A negative result may occur with  improper specimen collection/handling, submission of specimen other than nasopharyngeal swab, presence of viral mutation(s) within the areas targeted by this assay, and inadequate number of viral copies(<138  copies/mL). A negative result must be combined with clinical observations, patient history, and epidemiological information. The expected result is Negative.  Fact Sheet for Patients:  BloggerCourse.com  Fact Sheet for Healthcare Providers:  SeriousBroker.it  This test is no t yet approved or cleared by the [place]  FDA and  has been authorized for detection and/or diagnosis of SARS-CoV-2 by FDA under an Emergency Use Authorization (EUA). This EUA will remain  in effect (meaning this test can be used) for the duration of the COVID-19 declaration under Section 564(b)(1) of the Act, 21 U.S.C.section 360bbb-3(b)(1), unless the authorization is terminated  or revoked sooner.       Influenza A by PCR NEGATIVE NEGATIVE Final   Influenza B by PCR NEGATIVE NEGATIVE Final    Comment: (NOTE) The Xpert Xpress SARS-CoV-2/FLU/RSV plus assay is intended as an aid in the diagnosis of influenza from Nasopharyngeal swab specimens and should not be used as a sole basis for treatment. Nasal washings and aspirates are unacceptable for Xpert Xpress SARS-CoV-2/FLU/RSV testing.  Fact Sheet for Patients: BloggerCourse.com  Fact Sheet for Healthcare Providers: SeriousBroker.it  This test is not yet approved or cleared by the [place]  FDA and has been authorized for detection and/or diagnosis of SARS-CoV-2 by FDA under an Emergency Use Authorization (EUA). This EUA will remain in effect (meaning this test can be used) for the duration of the COVID-19 declaration under Section 564(b)(1) of the Act, 21 U.S.C. section 360bbb-3(b)(1), unless the authorization is terminated or revoked.     Resp Syncytial Virus by PCR NEGATIVE NEGATIVE Final    Comment: (NOTE) Fact Sheet for Patients: BloggerCourse.com  Fact Sheet for Healthcare  Providers: SeriousBroker.it  This test is not yet approved or cleared by the [place]  FDA and has been authorized for detection and/or diagnosis of SARS-CoV-2 by FDA under an Emergency Use Authorization (EUA). This EUA will remain in effect (meaning this test can be used) for the duration of the COVID-19 declaration under Section 564(b)(1) of the Act, 21 U.S.C. section 360bbb-3(b)(1), unless the authorization is terminated or revoked.  Performed at [facility], [address] [place], [place] [address]   Respiratory (~20 pathogens) panel by PCR     Status: None   Collection Time: 11/09/23  5:31 PM   Specimen: Nasopharyngeal Swab; Respiratory  Result Value Ref Range Status   Adenovirus NOT DETECTED NOT DETECTED Final   Coronavirus 229E NOT DETECTED NOT DETECTED Final    Comment: (NOTE) The Coronavirus on the Respiratory Panel, DOES NOT test for the novel  Coronavirus (2019 nCoV)    Coronavirus HKU1 NOT DETECTED NOT DETECTED Final   Coronavirus NL63 NOT DETECTED NOT DETECTED Final   Coronavirus OC43 NOT DETECTED NOT DETECTED Final   Metapneumovirus NOT DETECTED NOT DETECTED Final   Rhinovirus / Enterovirus NOT DETECTED NOT DETECTED Final   Influenza A NOT DETECTED NOT DETECTED Final  Influenza B NOT DETECTED NOT DETECTED Final   Parainfluenza Virus 1 NOT DETECTED NOT DETECTED Final   Parainfluenza Virus 2 NOT DETECTED NOT DETECTED Final   Parainfluenza Virus 3 NOT DETECTED NOT DETECTED Final   Parainfluenza Virus 4 NOT DETECTED NOT DETECTED Final   Respiratory Syncytial Virus NOT DETECTED NOT DETECTED Final   Bordetella pertussis NOT DETECTED NOT DETECTED Final   Bordetella Parapertussis NOT DETECTED NOT DETECTED Final   Chlamydophila pneumoniae NOT DETECTED NOT DETECTED Final   Mycoplasma pneumoniae NOT DETECTED NOT DETECTED Final    Comment: Performed at [facility], [address] [place], [place] [address]  Culture, blood  (Routine X 2) w Reflex to ID Panel     Status: None (Preliminary result)   Collection Time: 11/09/23  6:16 PM   Specimen: BLOOD  Result Value Ref Range Status   Specimen Description BLOOD RIGHT ANTECUBITAL  Final   Special Requests   Final    BOTTLES DRAWN AEROBIC ONLY Blood Culture adequate volume   Culture   Final    NO GROWTH 4 DAYS Performed at [facility] [address], [place], [place] [address]    Report Status PENDING  Incomplete  Culture, blood (Routine X 2) w Reflex to ID Panel     Status: None (Preliminary result)   Collection Time: 11/09/23  6:23 PM   Specimen: BLOOD  Result Value Ref Range Status   Specimen Description BLOOD BLOOD LEFT FOREARM  Final   Special Requests   Final    BOTTLES DRAWN AEROBIC AND ANAEROBIC Blood Culture adequate volume   Culture   Final    NO GROWTH 4 DAYS Performed at [facility], [address], [place], [place] [address]    Report Status PENDING  Incomplete  Urine Culture     Status: Abnormal   Collection Time: 11/10/23  1:58 AM   Specimen: Urine, Random  Result Value Ref Range Status   Specimen Description   Final    URINE, RANDOM Performed at [facility], [address] [place], [place] [address]    Special Requests   Final    NONE Reflexed from [id number] Performed at [facility], [address] [place], [place] [address]    Culture 60,000 COLONIES/mL ENTEROCOCCUS FAECALIS (A)  Final   Report Status 11/12/2023 FINAL  Final   Organism ID, Bacteria ENTEROCOCCUS FAECALIS (A)  Final      Susceptibility   Enterococcus faecalis - MIC*    AMPICILLIN  <=2 SENSITIVE Sensitive     NITROFURANTOIN <=16 SENSITIVE Sensitive     VANCOMYCIN 1 SENSITIVE Sensitive     * 60,000 COLONIES/mL ENTEROCOCCUS FAECALIS    Coagulation Studies: No results for input(s): LABPROT, INR in the last 72 hours.  Urinalysis: No results for input(s): COLORURINE, LABSPEC, PHURINE, GLUCOSEU, HGBUR,  BILIRUBINUR, KETONESUR, PROTEINUR, UROBILINOGEN, NITRITE, LEUKOCYTESUR in the last 72 hours.  Invalid input(s): APPERANCEUR     Imaging: DG Chest Port 1 View Result Date: 11/11/2023 CLINICAL DATA:  Tachypnea EXAM: PORTABLE CHEST 1 VIEW COMPARISON:  11/08/2023 FINDINGS: Single frontal view of the chest demonstrates a stable cardiac silhouette. Lung volumes are diminished, with crowding of the pulmonary vasculature. No airspace disease, effusion, or pneumothorax. No acute bony abnormalities. IMPRESSION: 1. Low lung volumes, no acute airspace disease. Electronically Signed   By: Bobbye Burrow M.D.   On: 11/11/2023 17:54     Medications:    ampicillin -sulbactam (UNASYN ) IV Stopped (11/13/23 0552)   feeding supplement (  OSMOLITE 1.5 CAL) 65 mL/hr at 11/13/23 1330    sodium chloride    Intravenous Once   Chlorhexidine  Gluconate Cloth  6 each Topical Daily   Chlorhexidine  Gluconate Cloth  6 each Topical Q0600   epoetin alfa-epbx (RETACRIT) injection  4,000 Units Intravenous Q M,W,F-1800   free water   30 mL Per Tube Q4H   gentamicin  ointment   Topical TID   insulin  aspart  10 Units Subcutaneous Once   multivitamin  1 tablet Per Tube QHS   pantoprazole  (PROTONIX ) IV  40 mg Intravenous Q12H   sodium zirconium cyclosilicate  5 g Per Tube Daily   acetaminophen  **OR** acetaminophen , artificial tears, bisacodyl , ipratropium-albuterol , [DISCONTINUED] ondansetron  **OR** ondansetron  (ZOFRAN ) IV, mouth rinse  Assessment/ Plan:  Mr. Robert Vega is a 83 y.o.  male with obstructive sleep apnea, GERD, obesity, hypertension, dysphagia, severe esophageal dysmotility with achalasia status post PEG tube placement, recent aspiration pneumonia acute respiratory failure status post extubation 10/09/2023, who was admitted to [facility] on 10/05/2023 for Aspiration into respiratory tract, initial encounter [T17.908A] Severe sepsis (HCC) [A41.9, R65.20] History of dysphagia [Z87.898] Fever, unspecified  fever cause [R50.9] Multifocal pneumonia [J18.9]   1.  Acute kidney injury.  Baseline creatinine 0.81 from 10/14/2023.  Acute kidney injury secondary to ATN, obstructive uropathy, and progression of prerenal azotemia.   Patient is critically ill.  He was started on urgent hemodialysis for severe hyperkalemia and uremia. -Plan for short-term dialysis to see if clearing of uremia and restoration of electrolyte balance helps improved clinical condition. - Patient underwent third dialysis treatment today.  Reassess mental status again tomorrow but thus far does not appear to have made significant difference in his cognition.   2.  Severe hyperkalemia-potassium down to 4.8 and acceptable.   3.  Originally admitted for sepsis due to aspiration pneumonia, found to have distal esophageal obstruction with severe dysmotility.  He is status post botulinum toxin injection to the lower esophageal sphincter.  PEG tube placed this admission.  Tube feeds currently on hold due to concern of Ileus.  4.Anemia in the setting of renal failure - Continue Epogen 4000's IV with dialysis.  5.  Bilateral hydronephrosis Noted on CT.Urology team has evaluated the patient and it is thought to be secondary to reflux.  No intervention at this time due to fragile health.     LOS: 39 Kolbee Stallman 6/18/20254:45 PM

## 2023-11-13 NOTE — Procedures (Signed)
 HD Note:  Some information was entered later than the data was gathered due to patient care needs. The stated time with the data is accurate.  Received patient in bed to unit.   Alert and disoriented. Patient pleasantly singing and talking to himself.  Most word are not able to be understood by others.  Informed consent signed and in chart.   Access used: Right femoral HD trialysis catheter Access issues: Patient's arterial pressure became out of limits for the machine,  BFR reduced to 250.  Patient tolerated treatment well.   TX duration: 3 hours  Alert, without acute distress.  Total UF removed: 1000 ml  Hand-off given to patient's nurse.   Transported back to the room   Lorra Freeman L. Alva Jewels, RN Kidney Dialysis Unit.

## 2023-11-13 NOTE — Progress Notes (Addendum)
 PROGRESS NOTE Robert Vega    DOB: 1940-09-27, 83 y.o.  HYQ:[id number]    Code Status: Full Code   DOA: 10/05/2023   LOS: 39  Brief hospital course  Robert Vega is a 83 y.o male with significant PMH of OSA, GERD, Obesity, HTN, Dysphagia - presented to the ED 10/05/2023 from with hypoxia, fever and generalized weakness. Dx sepsis/pneumonia, concern for aspiration    Of note, EGD 03/2022 with food in upper esophagus complicated by aspiration event, cardiac arrest with round of CPR, and post resuscitation EGD with concern for lack of peristalsis. Hx prior EGD 08/2020 with note of abnormal cricopharyngeus, decrease in motility in esophagus, and spastic LES    5/10: Admit to [facility] service with sepsis due to Aspiration Pneumonia.  Course complicated by Acute Respiratory Failure due to Aspiration of vomitus w/ cardiac arrest  transfer to ICU and intubation.  5/11: flexible bronchoscopy  5/13: PEG tube, +vomiting last night, failed SAT/SBT 5/14: extubated but with severe delirium 5/20:  Botulinum toxin injection into the lower esophageal sphincter by Dr. Ole Berkeley 5/21: esophagram showing diffusely distended esophagus with distal obstruction and severe dysmotility suggestive of achalasia. 5/22: fall  5/23: Awaiting insurance authorization for rehab. 5/24: Tolerating some clear liquids 5/25: pulled out PEG tube this morning.  Foley catheter placed in the stoma.  GI able to place another PEG tube  5/26: Creatinine up at 1.83. Patient started having pain after tube feeding started.  CT showed that the PEG tube was not in the correct place.  Notified GI.  Started empiric antibiotics. 5/27:  To OR by Dr. Cornel Diesel - fever and elevated white count with suspected intra-abdominal peritonitis, re-placed feeding tube. 5/28: restart tube feeds  05/30: advancing tube feeds, remains on Zosyn   06/01-06/03: on and off AKI 06/04: family working on decision for SNF  06/07: repeat CT abd d/t higher WBC, holding tube  feeds but CT nothing acute  06/09: remains stable, pending SNF auth. New melena  06/10: GI repeat EGD, no bleeding  06/11: GI nothing else to add and HH stable GI has s/o, Cr up a bit, increasing free water  06/12: nephrology consult - restart IV fluids and monitor  06/14: Cr continues to worsen despite fluids via peg and iv, WBC increasing, not meeting sirs/sepsis but will workup for infection.  06/15: Cr substantially worse, complicated course today re: communication w/ family and medical team plan/goals - see progress note for details. In short, renal failure, was comfort measures then this was reversed back to aggressive treatment / full code. Watch renal fxn into tomorrow and decision at that time for dialysis or not 06/16: discussed at length w/ nephro and family, plan initiate HD today. Vasc placed temp cath. Per urology hydronephrosis on CT chronic/stable and no intervention plan (did not formally consult) 06/17: dialysis #2  11/13/23 -third dialysis today and mental status is improving  Assessment & Plan  Principal Problem:   Severe sepsis (HCC) Active Problems:   Gastrostomy complication (HCC)   Dysphagia   Achalasia of esophagus   AKI (acute kidney injury) (HCC)   Acute delirium   Acute respiratory failure with hypoxia and hypercapnia (HCC)   Essential hypertension, benign   GERD (gastroesophageal reflux disease)   Encounter for dialysis catheter care [facility])   Multifocal pneumonia   History of dysphagia   Aspiration pneumonia (HCC)   Obesity (BMI 30-39.9)   Generalized weakness   Generalized abdominal pain   Malnutrition of moderate degree   Melena  Current/acute problems:    AKI- now in HD - continue HD per nephrology  - Monitor UOP, Foley for strict monitoring    Goals of care  Son, Robert Vega will be the primary and only point of contact and we will not discuss separately with granddaughter Robert Vega unless she is on speaker phone or conference call w/ Robert Vega present  Re  code status: Robert Vega confirms that the patient would want CPR and life support if needed, even if unlikely to be successful. Reasoning is: if or when arrest occurs, if patient does not survive CPR etc, then at least everything has been tried.  I have asked family to provide us  with HCPOA / advanced directive / living will paperwork asap. Particular attention to living will, if this is included. If pt has indicated wish for natural death, this may affect GOC discussions going forward if he is not a candidate for long-term dialysis or if he decompensates clinically. If he has indicated wish for any/all aggressive treatment, will stay the course.   Unasyn  has been started for suspected UTI. Will continue to complete treatment  Decreased bowel sounds  Abd distention Question ileus  Holding tube feeds No N/V, but low threshold for NG if no BM and if nausea/vomiting develops Would slowly restart tomorrow  Anemia- hgb decreased today to 6.7. ordered unit pRBCs.  - CBC am     Hospital problems stable/resolved:    Melena - resolved  Anemia chronic disease  Repeat EGD 06/10 no concerns, GI has s/o HH has been stable Follow CBC    Severe sepsis - resolved  Initially w/ aspiration pna Later in hospital stay w/ intra-abdominal infection secondary to PEG tube not being in the right place See hospital course    Achalasia Dysphagia Aspiration risk is high  See hospital course  Status post botulinum toxin injection on 5/20.   tube feeds as able, holding for now as above  N.p.o. for the foreseeable future per GI - discussed risk/benefit w/ granddaughter. Advised granddaughter that if she decides for po, she must accept risk for aspiration  SLP to follow  Will need outpatient referral to tertiary advanced GI for consideration of POEMS procedure.  Recommend this only after a few weeks recovery period.    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  VTE ppx: heparin   Diet:     Diet   Diet NPO time specified Except for: Ice Chips   Consultants: Nephrology  GI  Subjective 11/13/23    Pt reports no complaints. Unable to state his location or condition. He is oriented to self and speaks incoherently throughout encounter.    Objective  Blood pressure 101/70, pulse 86, temperature 97.7 F (36.5 C), temperature source Oral, resp. rate 19, height 5' 9.02 (1.753 m), weight 102.1 kg, SpO2 95%.  Intake/Output Summary (Last 24 hours) at 11/13/2023 0741 Last  data filed at 11/13/2023 0537 Gross per 24 hour  Intake 566.68 ml  Output 1000 ml  Net -433.32 ml   Filed Weights   11/12/23 0248 11/12/23 0828 11/13/23 0500  Weight: 105.5 kg 103 kg 102.1 kg     Physical Exam:  General: awake, alert, NAD HEENT: atraumatic, clear conjunctiva, anicteric sclera, MMM, hearing grossly normal Respiratory: normal respiratory effort. Cardiovascular: quick capillary refill, normal S1/S2, RRR, no JVD, murmurs Gastrointestinal: soft, NT, ND Nervous: A&O xself. Moving all limbs spontaneously Extremities: moves all equally, no edema, normal tone Skin: dry, intact, normal temperature, normal color. No rashes, lesions or ulcers on exposed skin Psychiatry: mildly agitated and not redirectable. Unable to follow commands.   Labs   I  have personally reviewed the following labs and imaging studies CBC    Component Value Date/Time   WBC 11.4 (H) 11/13/2023 0423   RBC 2.28 (L) 11/13/2023 0423   HGB 6.7 (L) 11/13/2023 0423   HCT 21.0 (L) 11/13/2023 0423   PLT 309 11/13/2023 0423   MCV 92.1 11/13/2023 0423   MCH 29.4 11/13/2023 0423   MCHC 31.9 11/13/2023 0423   RDW 14.5 11/13/2023 0423   LYMPHSABS 1.6 10/31/2023 0218   MONOABS 2.3 (H) 10/31/2023 0218   EOSABS 0.1 10/31/2023 0218   BASOSABS 0.1 10/31/2023 0218      Latest Ref Rng & Units 11/13/2023    4:23 AM 11/12/2023   11:05 PM 11/12/2023    7:39 AM  BMP  Glucose 70 - 99 mg/dL 161  096  045   BUN 8 - 23 mg/dL 69  62  89   Creatinine 0.61 - 1.24 mg/dL 4.09  8.11  9.14   Sodium 135 - 145 mmol/L 134  133  133   Potassium 3.5 - 5.1 mmol/L 4.8  4.8  5.9   Chloride 98 - 111 mmol/L 97  96  96   CO2 22 - 32 mmol/L 25  23  25    Calcium 8.9 - 10.3 mg/dL 7.4  7.2  7.4     DG Chest Port 1 View Result Date: 11/11/2023 CLINICAL DATA:  Tachypnea EXAM: PORTABLE CHEST 1 VIEW COMPARISON:  11/08/2023 FINDINGS: Single frontal view of the chest demonstrates a stable cardiac silhouette. Lung volumes are diminished, with crowding of the pulmonary vasculature. No airspace disease, effusion, or pneumothorax. No acute bony abnormalities. IMPRESSION: 1. Low lung volumes, no acute airspace disease. Electronically Signed   By: Bobbye Burrow M.D.   On: 11/11/2023 17:54   PERIPHERAL VASCULAR CATHETERIZATION Result Date: 11/11/2023 See surgical note for result.   Disposition Plan & Communication  Patient status: Inpatient  Admitted From: Home Planned disposition location: TBD Anticipated discharge date: TBD pending renal function   Family Communication: none at bedside    Author: Ree Candy, DO [facility] 11/13/2023, 7:41 AM   Available by Epic secure chat 7AM-7PM. If 7PM-7AM, please contact night-coverage.  [facility] contact information found on [email or web address].

## 2023-11-14 DIAGNOSIS — R6520 Severe sepsis without septic shock: Secondary | ICD-10-CM | POA: Diagnosis not present

## 2023-11-14 DIAGNOSIS — A419 Sepsis, unspecified organism: Secondary | ICD-10-CM | POA: Diagnosis not present

## 2023-11-14 DIAGNOSIS — N179 Acute kidney failure, unspecified: Secondary | ICD-10-CM | POA: Diagnosis not present

## 2023-11-14 LAB — CBC
HCT: 26.1 % — ABNORMAL LOW (ref 39.0–52.0)
Hemoglobin: 8.7 g/dL — ABNORMAL LOW (ref 13.0–17.0)
MCH: 30.7 pg (ref 26.0–34.0)
MCHC: 33.3 g/dL (ref 30.0–36.0)
MCV: 92.2 fL (ref 80.0–100.0)
Platelets: 289 10*3/uL (ref 150–400)
RBC: 2.83 MIL/uL — ABNORMAL LOW (ref 4.22–5.81)
RDW: 14.4 % (ref 11.5–15.5)
WBC: 12 10*3/uL — ABNORMAL HIGH (ref 4.0–10.5)
nRBC: 0 % (ref 0.0–0.2)

## 2023-11-14 LAB — TYPE AND SCREEN
ABO/RH(D): O POS
Antibody Screen: NEGATIVE
Unit division: 0

## 2023-11-14 LAB — BPAM RBC
Blood Product Expiration Date: 202507172359
ISSUE DATE / TIME: 202506181427
Unit Type and Rh: 5100

## 2023-11-14 LAB — GLUCOSE, CAPILLARY
Glucose-Capillary: 115 mg/dL — ABNORMAL HIGH (ref 70–99)
Glucose-Capillary: 132 mg/dL — ABNORMAL HIGH (ref 70–99)
Glucose-Capillary: 132 mg/dL — ABNORMAL HIGH (ref 70–99)
Glucose-Capillary: 136 mg/dL — ABNORMAL HIGH (ref 70–99)
Glucose-Capillary: 138 mg/dL — ABNORMAL HIGH (ref 70–99)
Glucose-Capillary: 138 mg/dL — ABNORMAL HIGH (ref 70–99)
Glucose-Capillary: 139 mg/dL — ABNORMAL HIGH (ref 70–99)

## 2023-11-14 LAB — MAGNESIUM: Magnesium: 2.1 mg/dL (ref 1.7–2.4)

## 2023-11-14 NOTE — Evaluation (Signed)
 Physical Therapy Re-Evaluation Patient Details Name: Robert Vega MRN: [id number] DOB: 11-Jun-1940 Today's Date: 11/14/2023  History of Present Illness  83 y.o male with significant PMH of OSA, GERD, Obesity, HTN, Dysphagia: EGD 03/2022 with food in upper esophagus complicated by aspiration event, cardiac arrest with round of CPR, and post resuscitation EGD with concern for lack of peristalsis. Pt presented to ED on 10/05/2023 with hypoxia, fever and generalized weakness; developed acute respiratory failure requiring intubation 5/10 due to aspiration pneumonia, extubated 5/15, but had significant agitation required brief course of Precedex ; pt now s/p exploratory laparotomy with closure of gastrotomy and insertion of gastrostomy tube on 10/22/23.  PT order discontinued by MD 11/10/23 and new PT consult received 11/14/23.  Pt s/p R temporary femoral vein dialysis catheter placement 11/11/23.  Clinical Impression  PT re-evaluation performed d/t PT order discontinued by MD 11/10/23 and then new PT consult received 11/14/23.  Pt sleeping upon PT arrival but woken with vc's and alert after that (although pt fell back asleep fairly quickly end of session).  Pt with impaired clarity of speech and appearing confused in general (pt often talking about things that were not related to topic therapist was attempting to discuss; pt unable to state his name or DOB).  Pt demonstrating difficulty following 1 step cues.  Limited activity d/t R temporary femoral catheter precautions (confirmed with MD Lateef).  Performed L LE active assistive ex's but pt with difficulty following cues for ex's.  PT POC reviewed and updated.  Will adjust POC/goals/PT frequency once activity status is upgraded (currently limited d/t R temporary fem catheter precautions).        If plan is discharge home, recommend the following: Two people to help with walking and/or transfers;Two people to help with bathing/dressing/bathroom;Supervision due to  cognitive status;Direct supervision/assist for medications management;Direct supervision/assist for financial management;Assist for transportation;Assistance with cooking/housework;Help with stairs or ramp for entrance   Can travel by private vehicle   No    Equipment Recommendations Other (comment) (TBD at next facility)  Recommendations for Other Services       Functional Status Assessment Patient has had a recent decline in their functional status and demonstrates the ability to make significant improvements in function in a reasonable and predictable amount of time.     Precautions / Restrictions Precautions Precautions: Fall Recall of Precautions/Restrictions: Impaired Precaution/Restrictions Comments: abdominal binder; PEG tube; R temporary fem catheter precautions Restrictions Weight Bearing Restrictions Per Provider Order: No Other Position/Activity Restrictions: Safety mitts donned      Mobility  Bed Mobility               General bed mobility comments: Deferred d/t R temporary fem catheter precautions    Transfers                        Ambulation/Gait                  Stairs            Wheelchair Mobility     Tilt Bed    Modified Rankin (Stroke Patients Only)       Balance                                             Pertinent Vitals/Pain Pain Assessment Pain Assessment: Faces Faces Pain Scale: No hurt Pain  Intervention(s): Limited activity within patient's tolerance, Monitored during session    Home Living Family/patient expects to be discharged to:: Private residence Living Arrangements: Alone Available Help at Discharge: Family;Available 24 hours/day Type of Home: House Home Access: Stairs to enter   [employer] of Steps: 2   Home Layout: One level Home Equipment: None Additional Comments: Home setup per granddaughter in room on original PT eval; reports pt has girlfriend who  could stay 24/7 or pt could go to her house.    Prior Function Prior Level of Function : Independent/Modified Independent;History of Falls (last six months)             Mobility Comments: History of falls on uneven ground (garden and front steps)       Extremity/Trunk Assessment   Upper Extremity Assessment Upper Extremity Assessment: Defer to OT evaluation    Lower Extremity Assessment Lower Extremity Assessment: RLE deficits/detail;LLE deficits/detail;Difficult to assess due to impaired cognition RLE Deficits / Details: R ankle DF/PF ROM WFL; appearing at least 3/5 AROM DF/PF; limited assessment d/t R temporary fem catheter precautions and impaired cognition LLE Deficits / Details: L ankle DF/PF ROM WFL; appearing at least 3/5 AROM DF/PF; limited assessment d/t impaired cognition    Cervical / Trunk Assessment Cervical / Trunk Assessment: Normal  Communication   Communication Communication: Impaired Factors Affecting Communication: Reduced clarity of speech;Difficulty expressing self    Cognition Arousal:  (Pt sleeping upon PT arrival; pt woken with vc's and alert after that but fell back asleep fairly quickly end of session) Behavior During Therapy: Impulsive   PT - Cognitive impairments: History of cognitive impairments, No family/caregiver present to determine baseline, Orientation, Awareness, Memory, Attention, Initiation, Sequencing, Problem solving, Safety/Judgement   Orientation impairments: Person, Place, Time, Situation                   PT - Cognition Comments: Pt unable to state name, DOB, place, or general situation. Following commands: Impaired Following commands impaired: Follows one step commands inconsistently, Follows one step commands with increased time     Cueing Cueing Techniques: Verbal cues, Gestural cues, Tactile cues     General Comments General comments (skin integrity, edema, etc.): R temporary fem catheter site and PEG site  appearing intact.  Nursing cleared pt for participation in physical therapy.  Pt appearing agreeable to PT session.    Exercises General Exercises - Lower Extremity Ankle Circles/Pumps: AAROM, Strengthening, Both, 10 reps, Supine Heel Slides: AAROM, Strengthening, Left, 10 reps, Supine Hip ABduction/ADduction: AAROM, Strengthening, Left, 10 reps, Supine   Assessment/Plan    PT Assessment Patient needs continued PT services  PT Problem List Decreased activity tolerance;Decreased balance;Decreased mobility;Decreased coordination;Decreased cognition;Decreased safety awareness;Decreased knowledge of use of DME;Pain;Decreased strength;Decreased knowledge of precautions;Decreased skin integrity       PT Treatment Interventions Functional mobility training;Therapeutic activities;Therapeutic exercise;Balance training;Cognitive remediation;Patient/family education;Gait training;DME instruction    PT Goals (Current goals can be found in the Care Plan section)  Acute Rehab PT Goals Patient Stated Goal: none stated PT Goal Formulation: Patient unable to participate in goal setting Time For Goal Achievement: 11/28/23 Potential to Achieve Goals: Fair    Frequency Min 1X/week     Co-evaluation               AM-PAC PT 6 Clicks Mobility  Outcome Measure Help needed turning from your back to your side while in a flat bed without using bedrails?: Total Help needed moving from lying on your back to sitting  on the side of a flat bed without using bedrails?: Total Help needed moving to and from a bed to a chair (including a wheelchair)?: Total Help needed standing up from a chair using your arms (e.g., wheelchair or bedside chair)?: Total Help needed to walk in hospital room?: Total Help needed climbing 3-5 steps with a railing? : Total 6 Click Score: 6    End of Session   Activity Tolerance: Other (comment) (Limited per pt's impaired ability to follow cues and R temporary fem catheter  precautions) Patient left: in bed;with call bell/phone within reach;with bed alarm set;with nursing/sitter in room;Other (comment) (B heels floating via pillow support; sitter present taking vitals) Nurse Communication: Mobility status;Precautions;Other (comment) (R temporary fem catheter precautions) PT Visit Diagnosis: Other abnormalities of gait and mobility (R26.89);Muscle weakness (generalized) (M62.81)    Time: 3500-9381 PT Time Calculation (min) (ACUTE ONLY): 12 min   Charges:   PT Evaluation $PT Re-evaluation: 1 Re-eval   PT General Charges $$ ACUTE PT VISIT: 1 Visit        Amador Junes, PT 11/14/23, 5:37 PM

## 2023-11-14 NOTE — TOC Progression Note (Addendum)
 Transition of Care [facility]) - Progression Note    Patient Details  Name: Robert Vega MRN: [id number] Date of Birth: 1941-05-11  [facility] CM/SW Contact  Loman Risk, RN Phone Number: 11/14/2023, 1:42 PM  Clinical Narrative:     TOC following for medically readiness for authorization to be resubmitted for [facility]  Per MD patient will require outpatient HD chair prior to dc.  Ryanne and Louanna Rouse notified       Expected Discharge Plan and Services                                               Social Determinants of Health (SDOH) Interventions SDOH Screenings   Food Insecurity: Patient Unable To Answer (10/06/2023)  Recent Concern: Food Insecurity - Food Insecurity Present (09/11/2023)   Received from [facility]  Housing: Patient Unable To Answer (10/06/2023)  Recent Concern: Housing - High Risk (09/11/2023)   [facility]  Transportation Needs: Patient Unable To Answer (10/06/2023)  Utilities: Patient Unable To Answer (10/06/2023)  Depression (PHQ2-9): Low Risk  (05/07/2023)  Financial Resource Strain: Medium Risk (09/11/2023)   Received from [facility]  Social Connections: Unknown (10/06/2023)  Tobacco Use: Medium Risk (11/05/2023)    Readmission Risk Interventions     No data to display

## 2023-11-14 NOTE — Plan of Care (Signed)
  Problem: Education: Goal: Knowledge of General Education information will improve Description: Including pain rating scale, medication(s)/side effects and non-pharmacologic comfort measures Outcome: Progressing   Problem: Health Behavior/Discharge Planning: Goal: Ability to manage health-related needs will improve Outcome: Progressing   Problem: Clinical Measurements: Goal: Ability to maintain clinical measurements within normal limits will improve Outcome: Progressing Goal: Will remain free from infection Outcome: Progressing Goal: Diagnostic test results will improve Outcome: Progressing Goal: Respiratory complications will improve Outcome: Progressing Goal: Cardiovascular complication will be avoided Outcome: Progressing   Problem: Activity: Goal: Risk for activity intolerance will decrease Outcome: Progressing   Problem: Nutrition: Goal: Adequate nutrition will be maintained Outcome: Progressing   Problem: Coping: Goal: Level of anxiety will decrease Outcome: Progressing   Problem: Elimination: Goal: Will not experience complications related to bowel motility Outcome: Progressing Goal: Will not experience complications related to urinary retention Outcome: Progressing   Problem: Pain Managment: Goal: General experience of comfort will improve and/or be controlled Outcome: Progressing   Problem: Safety: Goal: Ability to remain free from injury will improve Outcome: Progressing   Problem: Skin Integrity: Goal: Risk for impaired skin integrity will decrease Outcome: Progressing   Problem: Activity: Goal: Ability to tolerate increased activity will improve Outcome: Progressing   Problem: Clinical Measurements: Goal: Ability to maintain a body temperature in the normal range will improve Outcome: Progressing   Problem: Respiratory: Goal: Ability to maintain adequate ventilation will improve Outcome: Progressing Goal: Ability to maintain a clear airway  will improve Outcome: Progressing   Problem: Education: Goal: Ability to describe self-care measures that may prevent or decrease complications (Diabetes Survival Skills Education) will improve Outcome: Progressing Goal: Individualized Educational Video(s) Outcome: Progressing   Problem: Coping: Goal: Ability to adjust to condition or change in health will improve Outcome: Progressing   Problem: Fluid Volume: Goal: Ability to maintain a balanced intake and output will improve Outcome: Progressing   Problem: Health Behavior/Discharge Planning: Goal: Ability to identify and utilize available resources and services will improve Outcome: Progressing Goal: Ability to manage health-related needs will improve Outcome: Progressing   Problem: Metabolic: Goal: Ability to maintain appropriate glucose levels will improve Outcome: Progressing   Problem: Nutritional: Goal: Maintenance of adequate nutrition will improve Outcome: Progressing Goal: Progress toward achieving an optimal weight will improve Outcome: Progressing   Problem: Skin Integrity: Goal: Risk for impaired skin integrity will decrease Outcome: Progressing   Problem: Tissue Perfusion: Goal: Adequacy of tissue perfusion will improve Outcome: Progressing   Problem: Activity: Goal: Ability to tolerate increased activity will improve Outcome: Progressing   Problem: Respiratory: Goal: Ability to maintain a clear airway and adequate ventilation will improve Outcome: Progressing   Problem: Role Relationship: Goal: Method of communication will improve Outcome: Progressing

## 2023-11-14 NOTE — Progress Notes (Signed)
 Nutrition Follow Up Note   DOCUMENTATION CODES:   Non-severe (moderate) malnutrition in context of acute illness/injury  INTERVENTION:   Osmolite 1.5 @65ml /hr continuous   Free water  flushes 30ml q4 hours to maintain tube patency   Regimen provides 2340kcal/day, 98g/day protein and 1368ml/day of free water .   Rena-vit daily via tube  Daily weights   NUTRITION DIAGNOSIS:   Moderate Malnutrition related to acute illness as evidenced by mild fat depletion, mild muscle depletion, moderate muscle depletion, percent weight loss. -ongoing   GOAL:   Patient will meet greater than or equal to 90% of their needs -progressing   MONITOR:   PO intake, Labs, Weight trends, TF tolerance, I & O's, Skin  ASSESSMENT:   83 y/o male with h/o GERD, esophageal dysphagia, HTN, BPH, mood disorder and hiatal hernia who is admitted with aspiration event, PNA, sepsis, cardiac arrest and dysphagia.  Visited pt's room today. Pt continues to have AMS; pt is able to state his name. Pt remains NPO. Tube feeds held 6/15 for abdominal distension and constipation; KUB with no significant findings. Tube feeds restarted at trickle rate on 6/17; RN began advancing tube feeds yesterday by 68ml/hr. Pt is currently tolerating tube feeds well at 47ml/hr and is advancing towards goal rate. Pt is having bowel function. Abdomen non distended. Refeed labs stable. Pt with worsening renal failure requiring HD initiation 6/16 and has received HD daily. Per chart, pt is down ~12lbs(5%) since admission but remains weight stable for the past month. Plan is for SNF at discharge.   Medications reviewed and include: epoetin, heparin , insulin , rena-vit, protonix , lokelma, unasyn , NaCl @50ml /hr   Labs reviewed: Na 134(L), K 4.8 wnl, BUN 69(H), creat 5.26(H)- 6/18 Mg 2.1 wnl Wbc- 12.0(H), Hgb 8.7(L), Hct 26.1(L) Cbgs- 139, 136, 132, 115 x 24 hrs   UOP- 0ml   Nutrition Focused Physical Exam:  Flowsheet Row Most Recent Value   Orbital Region No depletion  Upper Arm Region Moderate depletion  Thoracic and Lumbar Region No depletion  Buccal Region No depletion  Temple Region Mild depletion  Clavicle Bone Region Moderate depletion  Clavicle and Acromion Bone Region Moderate depletion  Scapular Bone Region No depletion  Dorsal Hand Mild depletion  Patellar Region Moderate depletion  Anterior Thigh Region Moderate depletion  Posterior Calf Region Moderate depletion  Edema (RD Assessment) Mild  Hair Reviewed  Eyes Reviewed  Mouth Reviewed  Skin Reviewed  Nails Reviewed   Diet Order:   Diet Order             Diet NPO time specified Except for: Ice Chips  Diet effective now                  EDUCATION NEEDS:   No education needs have been identified at this time  Skin:  Skin Assessment: Skin Integrity Issues: Skin Integrity Issues:: Incisions Incisions: abdomen Other: skin tear to lt lower arm  Last BM:  6/19- type 7  Height:   Ht Readings from Last 1 Encounters:  10/08/23 5' 9.02 (1.753 m)    Weight:   Wt Readings from Last 1 Encounters:  11/13/23 102.1 kg    Ideal Body Weight:  72.7 kg  BMI:  Body mass index is 33.22 kg/m.  Estimated Nutritional Needs:   Kcal:  2000-2300kcal/day  Protein:  100-115g/day  Fluid:  1.9-2.2L/day  Torrance Freestone MS, RD, LDN If unable to be reached, please send secure chat to RD inpatient available from 8:00a-4:00p daily

## 2023-11-14 NOTE — Progress Notes (Signed)
 [facility]  ROUNDING NOTE   Subjective:   Patient appeared to be a bit more calm than before. Still has hand mittens on. Due for dialysis treatment again tomorrow.   Objective:  Vital signs in last 24 hours:  Temp:  [98.1 F (36.7 C)-98.4 F (36.9 C)] 98.3 F (36.8 C) (06/19 1509) Pulse Rate:  [81-90] 81 (06/19 1509) Resp:  [16-20] 16 (06/19 1509) BP: (111-140)/(61-90) 111/61 (06/19 1509) SpO2:  [96 %-97 %] 96 % (06/19 1509)  Weight change:  Filed Weights   11/12/23 0248 11/12/23 0828 11/13/23 0500  Weight: 105.5 kg 103 kg 102.1 kg    Intake/Output: I/O last 3 completed shifts: In: 1682.9 [Blood:366; NG/GT:1016.8; IV Piggyback:300.1] Out: 1000 [Other:1000]   Intake/Output this shift:  Total I/O In: 351.3 [NG/GT:351.3] Out: -   Physical Exam: General: Chronically ill-appearing  Head: Dry mucosal membranes  Eyes: Anicteri  Lungs:  [place] O2,  coarse breath sounds  Heart: Regular rate and rhythm  Abdomen:  Peg tube in place, midline incision, distended  Extremities: Trace peripheral edema.  Neurologic: Oriented only to self  Skin: No lesions  Access: Femoral dialysis catheter  Foley catheter in place  Basic Metabolic Panel: Recent Labs  Lab 11/09/23 0404 11/10/23 0537 11/11/23 0403 11/11/23 1853 11/12/23 0357 11/12/23 0739 11/12/23 2305 11/13/23 0423 11/14/23 0406  NA 145   < > 136 134*  --  133* 133* 134*  --   K 4.6   < > 6.3* 7.0*  --  5.9* 4.8 4.8  --   CL 110   < > 101 100  --  96* 96* 97*  --   CO2 25   < > 21* 19*  --  25 23 25   --   GLUCOSE 118*   < > 130* 116*  --  104* 103* 122*  --   BUN 60*   < > 107* 115*  --  89* 62* 69*  --   CREATININE 2.73*   < > 6.48* 7.67*  --  6.21* 4.94* 5.26*  --   CALCIUM 7.7*   < > 7.4* 7.7*  --  7.4* 7.2* 7.4*  --   MG 2.5*  --   --   --  2.2  --   --  2.1 2.1  PHOS 4.7*  --   --   --   --  7.1* 6.6*  --   --    < > = values in this interval not displayed.    Liver Function Tests: Recent Labs   Lab 11/10/23 1544 11/12/23 0739 11/12/23 2305 11/13/23 0423  AST 73*  --   --  65*  ALT 84*  --   --  76*  ALKPHOS 92  --   --  92  BILITOT 0.8  --   --  0.5  PROT 6.4*  --   --  6.3*  ALBUMIN 1.9* 1.7* 1.6* 1.7*   No results for input(s): LIPASE, AMYLASE in the last 168 hours. No results for input(s): AMMONIA in the last 168 hours.  CBC: Recent Labs  Lab 11/10/23 1544 11/11/23 0403 11/12/23 0357 11/13/23 0423 11/14/23 0406  WBC 17.4* 17.1* 16.1* 11.4* 12.0*  HGB 8.4* 8.0* 7.6* 6.7* 8.7*  HCT 25.7* 24.2* 23.4* 21.0* 26.1*  MCV 92.1 91.3 91.8 92.1 92.2  PLT 365 374 333 309 289    Cardiac Enzymes: No results for input(s): CKTOTAL, CKMB, CKMBINDEX, TROPONINI in the last 168 hours.  BNP: Invalid input(s): POCBNP  CBG: Recent Labs  Lab 11/14/23 0006 11/14/23 0343 11/14/23 0728 11/14/23 1135 11/14/23 1538  GLUCAP 115* 132* 136* 139* 132*    Microbiology: Results for orders placed or performed during the hospital encounter of 10/05/23  Blood Culture (routine x 2)     Status: None   Collection Time: 10/05/23  5:06 AM   Specimen: BLOOD  Result Value Ref Range Status   Specimen Description BLOOD LA  Final   Special Requests   Final    BOTTLES DRAWN AEROBIC AND ANAEROBIC Blood Culture results may not be optimal due to an inadequate volume of blood received in culture bottles   Culture   Final    NO GROWTH 5 DAYS Performed at [facility], [address] [place], [place] [address]    Report Status 10/10/2023 FINAL  Final  Blood Culture (routine x 2)     Status: None   Collection Time: 10/05/23  5:07 AM   Specimen: BLOOD  Result Value Ref Range Status   Specimen Description BLOOD RA  Final   Special Requests   Final    BOTTLES DRAWN AEROBIC AND ANAEROBIC Blood Culture results may not be optimal due to an inadequate volume of blood received in culture bottles   Culture   Final    NO GROWTH 5 DAYS Performed at [facility],  [address] [place], [place] [address]    Report Status 10/10/2023 FINAL  Final  Resp panel by RT-PCR (RSV, Flu A&B, Covid) Anterior Nasal Swab     Status: None   Collection Time: 10/05/23  5:56 AM   Specimen: Anterior Nasal Swab  Result Value Ref Range Status   SARS Coronavirus 2 by RT PCR NEGATIVE NEGATIVE Final    Comment: (NOTE) SARS-CoV-2 target nucleic acids are NOT DETECTED.  The SARS-CoV-2 RNA is generally detectable in upper respiratory specimens during the acute phase of infection. The lowest concentration of SARS-CoV-2 viral copies this assay can detect is 138 copies/mL. A negative result does not preclude SARS-Cov-2 infection and should not be used as the sole basis for treatment or other patient management decisions. A negative result may occur with  improper specimen collection/handling, submission of specimen other than nasopharyngeal swab, presence of viral mutation(s) within the areas targeted by this assay, and inadequate number of viral copies(<138 copies/mL). A negative result must be combined with clinical observations, patient history, and epidemiological information. The expected result is Negative.  Fact Sheet for Patients:  BloggerCourse.com  Fact Sheet for Healthcare Providers:  SeriousBroker.it  This test is no t yet approved or cleared by the [place]  FDA and  has been authorized for detection and/or diagnosis of SARS-CoV-2 by FDA under an Emergency Use Authorization (EUA). This EUA will remain  in effect (meaning this test can be used) for the duration of the COVID-19 declaration under Section 564(b)(1) of the Act, 21 U.S.C.section 360bbb-3(b)(1), unless the authorization is terminated  or revoked sooner.       Influenza A by PCR NEGATIVE NEGATIVE Final   Influenza B by PCR NEGATIVE NEGATIVE Final    Comment: (NOTE) The Xpert Xpress SARS-CoV-2/FLU/RSV plus assay is intended as an aid in  the diagnosis of influenza from Nasopharyngeal swab specimens and should not be used as a sole basis for treatment. Nasal washings and aspirates are unacceptable for Xpert Xpress SARS-CoV-2/FLU/RSV testing.  Fact Sheet for Patients: BloggerCourse.com  Fact Sheet for Healthcare Providers: SeriousBroker.it  This test is not yet approved or cleared by the  [place]  FDA and has been authorized for detection and/or diagnosis of SARS-CoV-2 by FDA under an Emergency Use Authorization (EUA). This EUA will remain in effect (meaning this test can be used) for the duration of the COVID-19 declaration under Section 564(b)(1) of the Act, 21 U.S.C. section 360bbb-3(b)(1), unless the authorization is terminated or revoked.     Resp Syncytial Virus by PCR NEGATIVE NEGATIVE Final    Comment: (NOTE) Fact Sheet for Patients: BloggerCourse.com  Fact Sheet for Healthcare Providers: SeriousBroker.it  This test is not yet approved or cleared by the [place]  FDA and has been authorized for detection and/or diagnosis of SARS-CoV-2 by FDA under an Emergency Use Authorization (EUA). This EUA will remain in effect (meaning this test can be used) for the duration of the COVID-19 declaration under Section 564(b)(1) of the Act, 21 U.S.C. section 360bbb-3(b)(1), unless the authorization is terminated or revoked.  Performed at [facility], [address] [place], [place] [address]   Respiratory (~20 pathogens) panel by PCR     Status: None   Collection Time: 10/05/23  8:11 AM   Specimen: Nasopharyngeal Swab; Respiratory  Result Value Ref Range Status   Adenovirus NOT DETECTED NOT DETECTED Final   Coronavirus 229E NOT DETECTED NOT DETECTED Final    Comment: (NOTE) The Coronavirus on the Respiratory Panel, DOES NOT test for the novel  Coronavirus (2019 nCoV)    Coronavirus HKU1 NOT  DETECTED NOT DETECTED Final   Coronavirus NL63 NOT DETECTED NOT DETECTED Final   Coronavirus OC43 NOT DETECTED NOT DETECTED Final   Metapneumovirus NOT DETECTED NOT DETECTED Final   Rhinovirus / Enterovirus NOT DETECTED NOT DETECTED Final   Influenza A NOT DETECTED NOT DETECTED Final   Influenza B NOT DETECTED NOT DETECTED Final   Parainfluenza Virus 1 NOT DETECTED NOT DETECTED Final   Parainfluenza Virus 2 NOT DETECTED NOT DETECTED Final   Parainfluenza Virus 3 NOT DETECTED NOT DETECTED Final   Parainfluenza Virus 4 NOT DETECTED NOT DETECTED Final   Respiratory Syncytial Virus NOT DETECTED NOT DETECTED Final   Bordetella pertussis NOT DETECTED NOT DETECTED Final   Bordetella Parapertussis NOT DETECTED NOT DETECTED Final   Chlamydophila pneumoniae NOT DETECTED NOT DETECTED Final   Mycoplasma pneumoniae NOT DETECTED NOT DETECTED Final    Comment: Performed at [facility], [address], [place], [place] [address]  Expectorated Sputum Assessment w Gram Stain, Rflx to Resp Cult     Status: None   Collection Time: 10/05/23  9:07 AM   Specimen: Sputum  Result Value Ref Range Status   Specimen Description SPUTUM  Final   Special Requests NONE  Final   Sputum evaluation   Final    Sputum specimen not acceptable for testing.  Please recollect.   C/KERRY NELSON AT 1005 10/05/23.PMF Performed at [facility], [address] [place], [place] [address]    Report Status 10/05/2023 FINAL  Final  Expectorated Sputum Assessment w Gram Stain, Rflx to Resp Cult     Status: None   Collection Time: 10/05/23 10:50 AM  Result Value Ref Range Status   Specimen Description EXPECTORATED SPUTUM  Final   Special Requests NONE  Final   Sputum evaluation   Final    THIS SPECIMEN IS ACCEPTABLE FOR SPUTUM CULTURE Performed at [facility], [address], [place], [place] [address]    Report Status 10/05/2023 FINAL  Final  Culture, Respiratory w Gram Stain     Status: None  Collection Time: 10/05/23 10:50 AM  Result Value Ref Range Status   Specimen Description   Final    EXPECTORATED SPUTUM Performed at [facility], [address] [place], [place] [address]    Special Requests   Final    NONE Reflexed from [id number] Performed at [facility], [address] [place], [place] [address]    Gram Stain   Final    RARE WBC SEEN RARE Hillis Lu POSITIVE RODS RARE Hillis Lu POSITIVE COCCI RARE GRAM NEGATIVE RODS    Culture   Final    FEW Normal respiratory flora-no Staph aureus or Pseudomonas seen Performed at [facility], [address], [place], [place] [address]    Report Status 10/07/2023 FINAL  Final  MRSA Next Gen by PCR, Nasal     Status: None   Collection Time: 10/06/23 12:57 AM   Specimen: Nasal Mucosa; Nasal Swab  Result Value Ref Range Status   MRSA by PCR Next Gen NOT DETECTED NOT DETECTED Final    Comment: (NOTE) The GeneXpert MRSA Assay (FDA approved for NASAL specimens only), is one component of a comprehensive MRSA colonization surveillance program. It is not intended to diagnose MRSA infection nor to guide or monitor treatment for MRSA infections. Test performance is not FDA approved in patients less than 22 years old. Performed at [facility], [address] [place], [place] [address]   Culture, Respiratory w Gram Stain     Status: None   Collection Time: 10/06/23 11:37 AM   Specimen: INDUCED SPUTUM  Result Value Ref Range Status   Specimen Description   Final    INDUCED SPUTUM Performed at [facility], [address], [place], [place] [address]    Special Requests   Final    NONE Performed at [facility], [address] [place], [place] [address]    Gram Stain   Final    FEW WBC PRESENT,BOTH PMN AND MONONUCLEAR FEW GRAM POSITIVE RODS    Culture   Final    MODERATE LACTOBACILLUS FERMENTUM Standardized susceptibility testing for this organism is not available. Performed at  [facility], [address] [place], [place] [address]    Report Status 10/09/2023 FINAL  Final  Culture, blood (x 2)     Status: None   Collection Time: 10/22/23  1:00 AM   Specimen: BLOOD  Result Value Ref Range Status   Specimen Description BLOOD BLOOD RIGHT ARM  Final   Special Requests   Final    BOTTLES DRAWN AEROBIC AND ANAEROBIC Blood Culture adequate volume   Culture   Final    NO GROWTH 5 DAYS Performed at [facility], [address], [place], [place] [address]    Report Status 10/27/2023 FINAL  Final  Culture, blood (x 2)     Status: None   Collection Time: 10/22/23  1:00 AM   Specimen: BLOOD  Result Value Ref Range Status   Specimen Description BLOOD BLOOD RIGHT ARM  Final   Special Requests   Final    BOTTLES DRAWN AEROBIC AND ANAEROBIC Blood Culture adequate volume   Culture   Final    NO GROWTH 5 DAYS Performed at [facility], [address] [place], [place] [address]    Report Status 10/27/2023 FINAL  Final  Resp panel by RT-PCR (RSV, Flu A&B, Covid) Anterior Nasal Swab     Status: None   Collection Time: 11/09/23  5:31 PM   Specimen: Anterior  Nasal Swab  Result Value Ref Range Status   SARS Coronavirus 2 by RT PCR NEGATIVE NEGATIVE Final    Comment: (NOTE) SARS-CoV-2 target nucleic acids are NOT DETECTED.  The SARS-CoV-2 RNA is generally detectable in upper respiratory specimens during the acute phase of infection. The lowest concentration of SARS-CoV-2 viral copies this assay can detect is 138 copies/mL. A negative result does not preclude SARS-Cov-2 infection and should not be used as the sole basis for treatment or other patient management decisions. A negative result may occur with  improper specimen collection/handling, submission of specimen other than nasopharyngeal swab, presence of viral mutation(s) within the areas targeted by this assay, and inadequate number of viral copies(<138 copies/mL). A negative result must  be combined with clinical observations, patient history, and epidemiological information. The expected result is Negative.  Fact Sheet for Patients:  BloggerCourse.com  Fact Sheet for Healthcare Providers:  SeriousBroker.it  This test is no t yet approved or cleared by the [place]  FDA and  has been authorized for detection and/or diagnosis of SARS-CoV-2 by FDA under an Emergency Use Authorization (EUA). This EUA will remain  in effect (meaning this test can be used) for the duration of the COVID-19 declaration under Section 564(b)(1) of the Act, 21 U.S.C.section 360bbb-3(b)(1), unless the authorization is terminated  or revoked sooner.       Influenza A by PCR NEGATIVE NEGATIVE Final   Influenza B by PCR NEGATIVE NEGATIVE Final    Comment: (NOTE) The Xpert Xpress SARS-CoV-2/FLU/RSV plus assay is intended as an aid in the diagnosis of influenza from Nasopharyngeal swab specimens and should not be used as a sole basis for treatment. Nasal washings and aspirates are unacceptable for Xpert Xpress SARS-CoV-2/FLU/RSV testing.  Fact Sheet for Patients: BloggerCourse.com  Fact Sheet for Healthcare Providers: SeriousBroker.it  This test is not yet approved or cleared by the [place]  FDA and has been authorized for detection and/or diagnosis of SARS-CoV-2 by FDA under an Emergency Use Authorization (EUA). This EUA will remain in effect (meaning this test can be used) for the duration of the COVID-19 declaration under Section 564(b)(1) of the Act, 21 U.S.C. section 360bbb-3(b)(1), unless the authorization is terminated or revoked.     Resp Syncytial Virus by PCR NEGATIVE NEGATIVE Final    Comment: (NOTE) Fact Sheet for Patients: BloggerCourse.com  Fact Sheet for Healthcare Providers: SeriousBroker.it  This test is not  yet approved or cleared by the [place]  FDA and has been authorized for detection and/or diagnosis of SARS-CoV-2 by FDA under an Emergency Use Authorization (EUA). This EUA will remain in effect (meaning this test can be used) for the duration of the COVID-19 declaration under Section 564(b)(1) of the Act, 21 U.S.C. section 360bbb-3(b)(1), unless the authorization is terminated or revoked.  Performed at [facility], [address] [place], [place] [address]   Respiratory (~20 pathogens) panel by PCR     Status: None   Collection Time: 11/09/23  5:31 PM   Specimen: Nasopharyngeal Swab; Respiratory  Result Value Ref Range Status   Adenovirus NOT DETECTED NOT DETECTED Final   Coronavirus 229E NOT DETECTED NOT DETECTED Final    Comment: (NOTE) The Coronavirus on the Respiratory Panel, DOES NOT test for the novel  Coronavirus (2019 nCoV)    Coronavirus HKU1 NOT DETECTED NOT DETECTED Final   Coronavirus NL63 NOT DETECTED NOT DETECTED Final   Coronavirus OC43 NOT DETECTED NOT DETECTED Final   Metapneumovirus NOT DETECTED NOT DETECTED Final   Rhinovirus /  Enterovirus NOT DETECTED NOT DETECTED Final   Influenza A NOT DETECTED NOT DETECTED Final   Influenza B NOT DETECTED NOT DETECTED Final   Parainfluenza Virus 1 NOT DETECTED NOT DETECTED Final   Parainfluenza Virus 2 NOT DETECTED NOT DETECTED Final   Parainfluenza Virus 3 NOT DETECTED NOT DETECTED Final   Parainfluenza Virus 4 NOT DETECTED NOT DETECTED Final   Respiratory Syncytial Virus NOT DETECTED NOT DETECTED Final   Bordetella pertussis NOT DETECTED NOT DETECTED Final   Bordetella Parapertussis NOT DETECTED NOT DETECTED Final   Chlamydophila pneumoniae NOT DETECTED NOT DETECTED Final   Mycoplasma pneumoniae NOT DETECTED NOT DETECTED Final    Comment: Performed at [facility], [address] [place], [place] [address]  Culture, blood (Routine X 2) w Reflex to ID Panel     Status: None (Preliminary result)    Collection Time: 11/09/23  6:16 PM   Specimen: BLOOD  Result Value Ref Range Status   Specimen Description BLOOD RIGHT ANTECUBITAL  Final   Special Requests   Final    BOTTLES DRAWN AEROBIC ONLY Blood Culture adequate volume   Culture   Final    NO GROWTH 4 DAYS Performed at [facility] [address], [place], [place] [address]    Report Status PENDING  Incomplete  Culture, blood (Routine X 2) w Reflex to ID Panel     Status: None (Preliminary result)   Collection Time: 11/09/23  6:23 PM   Specimen: BLOOD  Result Value Ref Range Status   Specimen Description BLOOD BLOOD LEFT FOREARM  Final   Special Requests   Final    BOTTLES DRAWN AEROBIC AND ANAEROBIC Blood Culture adequate volume   Culture   Final    NO GROWTH 4 DAYS Performed at [facility], [address] [place], [place] [address]    Report Status PENDING  Incomplete  Urine Culture     Status: Abnormal   Collection Time: 11/10/23  1:58 AM   Specimen: Urine, Random  Result Value Ref Range Status   Specimen Description   Final    URINE, RANDOM Performed at [facility], [address], [place], [place] [address]    Special Requests   Final    NONE Reflexed from [id number] Performed at [facility], [address] [place], [place] [address]    Culture 60,000 COLONIES/mL ENTEROCOCCUS FAECALIS (A)  Final   Report Status 11/12/2023 FINAL  Final   Organism ID, Bacteria ENTEROCOCCUS FAECALIS (A)  Final      Susceptibility   Enterococcus faecalis - MIC*    AMPICILLIN  <=2 SENSITIVE Sensitive     NITROFURANTOIN <=16 SENSITIVE Sensitive     VANCOMYCIN 1 SENSITIVE Sensitive     * 60,000 COLONIES/mL ENTEROCOCCUS FAECALIS    Coagulation Studies: No results for input(s): LABPROT, INR in the last 72 hours.  Urinalysis: No results for input(s): COLORURINE, LABSPEC, PHURINE, GLUCOSEU, HGBUR, BILIRUBINUR, KETONESUR, PROTEINUR, UROBILINOGEN, NITRITE, LEUKOCYTESUR  in the last 72 hours.  Invalid input(s): APPERANCEUR     Imaging: No results found.    Medications:    ampicillin -sulbactam (UNASYN ) IV 3 g (11/14/23 1016)   feeding supplement (OSMOLITE 1.5 CAL) 40 mL/hr at 11/14/23 1253    sodium chloride    Intravenous Once   Chlorhexidine  Gluconate Cloth  6 each Topical Daily   Chlorhexidine  Gluconate Cloth  6 each Topical Q0600   epoetin alfa-epbx (RETACRIT) injection  4,000 Units Intravenous Q M,W,F-1800   free water   30  mL Per Tube Q4H   gentamicin  ointment   Topical TID   heparin  injection (subcutaneous)  5,000 Units Subcutaneous Q8H   insulin  aspart  10 Units Subcutaneous Once   multivitamin  1 tablet Per Tube QHS   pantoprazole  (PROTONIX ) IV  40 mg Intravenous Q12H   sodium zirconium cyclosilicate  5 g Per Tube Daily   acetaminophen  **OR** acetaminophen , artificial tears, bisacodyl , ipratropium-albuterol , [DISCONTINUED] ondansetron  **OR** ondansetron  (ZOFRAN ) IV, mouth rinse  Assessment/ Plan:  Robert Vega is a 83 y.o.  male with obstructive sleep apnea, GERD, obesity, hypertension, dysphagia, severe esophageal dysmotility with achalasia status post PEG tube placement, recent aspiration pneumonia acute respiratory failure status post extubation 10/09/2023, who was admitted to [facility] on 10/05/2023 for Aspiration into respiratory tract, initial encounter [T17.908A] Severe sepsis (HCC) [A41.9, R65.20] History of dysphagia [Z87.898] Fever, unspecified fever cause [R50.9] Multifocal pneumonia [J18.9]   1.  Acute kidney injury.  Baseline creatinine 0.81 from 10/14/2023.  Acute kidney injury secondary to ATN, obstructive uropathy, and progression of prerenal azotemia.   Patient is critically ill.  He was started on urgent hemodialysis for severe hyperkalemia and uremia. -Plan for short-term dialysis to see if clearing of uremia and restoration of electrolyte balance helps improved clinical condition. - Patient a bit more calm but still  confused.  We will plan for hemodialysis treatment again tomorrow.   2.  Severe hyperkalemia-potassium down to 4.8 at last check.   3.  Originally admitted for sepsis due to aspiration pneumonia, found to have distal esophageal obstruction with severe dysmotility.  He is status post botulinum toxin injection to the lower esophageal sphincter.  PEG tube placed this admission.  Tube feeds currently on hold due to concern of Ileus.  4.Anemia in the setting of renal failure - Continue Epogen 4000's IV with dialysis.  Continue to monitor hemoglobin periodically.  5.  Bilateral hydronephrosis Noted on CT.Urology team has evaluated the patient and it is thought to be secondary to reflux.  No intervention at this time due to fragile health.     LOS: 40 Robert Vega 6/19/20255:30 PM

## 2023-11-14 NOTE — Progress Notes (Signed)
 PROGRESS NOTE JARL SELLITTO    DOB: Mar 31, 1941, 83 y.o.  LKG:[id number]    Code Status: Full Code   DOA: 10/05/2023   LOS: 40  Brief hospital course  Robert Vega is a 83 y.o male with significant PMH of OSA, GERD, Obesity, HTN, Dysphagia - presented to the ED 10/05/2023 from with hypoxia, fever and generalized weakness. Dx sepsis/pneumonia, concern for aspiration    Of note, EGD 03/2022 with food in upper esophagus complicated by aspiration event, cardiac arrest with round of CPR, and post resuscitation EGD with concern for lack of peristalsis. Hx prior EGD 08/2020 with note of abnormal cricopharyngeus, decrease in motility in esophagus, and spastic LES    5/10: Admit to [facility] service with sepsis due to Aspiration Pneumonia.  Course complicated by Acute Respiratory Failure due to Aspiration of vomitus w/ cardiac arrest  transfer to ICU and intubation.  5/11: flexible bronchoscopy  5/13: PEG tube, +vomiting last night, failed SAT/SBT 5/14: extubated but with severe delirium 5/20:  Botulinum toxin injection into the lower esophageal sphincter by Dr. Ole Berkeley 5/21: esophagram showing diffusely distended esophagus with distal obstruction and severe dysmotility suggestive of achalasia. At that point patient was improved to point of pursuing SNF placement.  Complications of PEG tube placement resulted in return to OR 5/27 and tube feeds restarted 5/28 with zosyn  for peritonitis.  Had new melena 6/9 so underwent another EGD without acute bleeding seen.  Renal function declined and nephrology was consulted and without improvement with conservative management, he received dialysis access and started on HD 6/16 as this was in line with family goals of care.  Discharge will now be pending arrangement of facility and outpatient HD if he does not have renal recovery.  He remains in poor prognosis state with intermittently needing to hold tube feeds again for intolerance and needed blood transfusion 6/18 for  anemia likely related more to the renal failure as no source of bleeding has been identified thus far.   Assessment & Plan  Principal Problem:   Severe sepsis (HCC) Active Problems:   Gastrostomy complication (HCC)   Dysphagia   Achalasia of esophagus   AKI (acute kidney injury) (HCC)   Acute delirium   Acute respiratory failure with hypoxia and hypercapnia (HCC)   Essential hypertension, benign   GERD (gastroesophageal reflux disease)   Encounter for dialysis catheter care [facility])   Multifocal pneumonia   History of dysphagia   Aspiration pneumonia (HCC)   Obesity (BMI 30-39.9)   Generalized weakness   Generalized abdominal pain   Malnutrition of moderate degree   Melena  Current/acute problems:    AKI- now on HD - continue HD per nephrology  - Monitor UOP, Foley for strict monitoring  - will need outpatient HD set up if in line to continue with family GOC.    Goals of care- Son, [name] will be the primary and only point of contact and we will not discuss separately with granddaughter Danelle Dunning unless she is on speaker phone or conference call w/ [name] present  - Re code status: [name] confirms that the patient would want CPR and life support if needed, even if unlikely to be successful. Reasoning is: if or when arrest occurs, if patient does not survive CPR etc, then at least everything has been tried.   Unasyn  has been started for suspected UTI. Will continue to complete treatment  Decreased bowel sounds  Question ileus- had bowel movement this morning and overnight. Will restart tube feeds. Monitor  abdominal status frequently and reimage if concern arises.   Anemia- hgb decreased to 6.7 in setting of renal failure without signs of acute bleeding. Received 1 unit pRBCs 6/18 and hgb is stable. Repeat EGD 06/10 no concerns, GI has s/o. HH has been stable - CBC am    Hospital problems stable/resolved:    Melena - resolved    Severe sepsis - resolved  Initially w/ aspiration  pna Later in hospital stay w/ intra-abdominal infection secondary to PEG tube not being in the right place See hospital course    Achalasia  Dysphagia  Aspiration risk is high  See hospital course  Status post botulinum toxin injection on 5/20.   tube feeds as able, holding for now as above  N.p.o. for the foreseeable future per GI - discussed risk/benefit w/ granddaughter. Advised granddaughter that if she decides for po, she must accept risk for aspiration  SLP to follow  Will need outpatient referral to tertiary advanced GI for consideration of POEMS procedure.  Recommend this only after a few weeks recovery period.    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  VTE ppx: heparin  injection 5,000 Units Start: 11/13/23 2200heparin  Diet:     Diet   Diet NPO time specified Except for: Ice Chips   Consultants: Nephrology  GI  Subjective 11/14/23    Pt reports no complaints. Denies pain.    Objective  Blood pressure 101/70, pulse 86, temperature 97.7 F (36.5 C), temperature source Oral, resp. rate 19, height 5' 9.02 (1.753 m), weight 102.1 kg, SpO2 95%.  Intake/Output Summary (Last 24 hours) at 11/14/2023 0737 Last data filed at 11/14/2023 0406 Gross per 24 hour  Intake 1316.17 ml  Output 1000 ml  Net 316.17 ml   Filed Weights   11/12/23 0248 11/12/23 0828 11/13/23 0500  Weight: 105.5 kg 103 kg 102.1 kg      Physical Exam:  General: awake, alert, NAD HEENT: atraumatic, clear conjunctiva, anicteric sclera, MMM, hearing grossly normal Respiratory: normal respiratory effort. Cardiovascular: quick capillary refill, normal S1/S2, RRR, no JVD, murmurs Gastrointestinal: soft, NT, ND Nervous: A&O xself. Moving all limbs spontaneously Extremities: moves all equally, no edema, normal tone Skin: dry, intact, normal temperature, normal color. No rashes, lesions or ulcers on exposed skin Psychiatry: calm and cooperative. Unable to follow commands.   Labs   I have personally reviewed the following labs and imaging studies CBC    Component Value Date/Time   WBC 12.0 (H) 11/14/2023 0406   RBC 2.83 (L) 11/14/2023 0406   HGB 8.7 (L) 11/14/2023 0406   HCT 26.1 (L) 11/14/2023 0406   PLT 289 11/14/2023 0406   MCV 92.2 11/14/2023 0406   MCH 30.7 11/14/2023 0406   MCHC 33.3 11/14/2023 0406   RDW 14.4 11/14/2023 0406   LYMPHSABS 1.6 10/31/2023 0218   MONOABS 2.3 (H) 10/31/2023 0218   EOSABS 0.1 10/31/2023 0218   BASOSABS 0.1 10/31/2023 0218      Latest  Ref Rng & Units 11/13/2023    4:23 AM 11/12/2023   11:05 PM 11/12/2023    7:39 AM  BMP  Glucose 70 - 99 mg/dL 409  811  914   BUN 8 - 23 mg/dL 69  62  89   Creatinine 0.61 - 1.24 mg/dL 7.82  9.56  2.13   Sodium 135 - 145 mmol/L 134  133  133   Potassium 3.5 - 5.1 mmol/L 4.8  4.8  5.9   Chloride 98 - 111 mmol/L 97  96  96   CO2 22 - 32 mmol/L 25  23  25    Calcium 8.9 - 10.3 mg/dL 7.4  7.2  7.4     No results found.   Disposition Plan & Communication  Patient status: Inpatient  Admitted From: Home Planned disposition location: SNF Anticipated discharge date: TBD pending renal function   Family Communication: son on phone   Author: Ree Candy, DO [facility] 11/14/2023, 7:37 AM   Available by Epic secure chat 7AM-7PM. If 7PM-7AM, please contact night-coverage.  [facility] contact information found on [email or web address].

## 2023-11-15 DIAGNOSIS — A419 Sepsis, unspecified organism: Secondary | ICD-10-CM | POA: Diagnosis not present

## 2023-11-15 DIAGNOSIS — R6520 Severe sepsis without septic shock: Secondary | ICD-10-CM | POA: Diagnosis not present

## 2023-11-15 DIAGNOSIS — N179 Acute kidney failure, unspecified: Secondary | ICD-10-CM | POA: Diagnosis not present

## 2023-11-15 LAB — GLUCOSE, CAPILLARY
Glucose-Capillary: 108 mg/dL — ABNORMAL HIGH (ref 70–99)
Glucose-Capillary: 129 mg/dL — ABNORMAL HIGH (ref 70–99)
Glucose-Capillary: 137 mg/dL — ABNORMAL HIGH (ref 70–99)
Glucose-Capillary: 139 mg/dL — ABNORMAL HIGH (ref 70–99)
Glucose-Capillary: 143 mg/dL — ABNORMAL HIGH (ref 70–99)

## 2023-11-15 LAB — RENAL FUNCTION PANEL
Albumin: 1.7 g/dL — ABNORMAL LOW (ref 3.5–5.0)
Anion gap: 11 (ref 5–15)
BUN: 69 mg/dL — ABNORMAL HIGH (ref 8–23)
CO2: 26 mmol/L (ref 22–32)
Calcium: 6.8 mg/dL — ABNORMAL LOW (ref 8.9–10.3)
Chloride: 97 mmol/L — ABNORMAL LOW (ref 98–111)
Creatinine, Ser: 6.92 mg/dL — ABNORMAL HIGH (ref 0.61–1.24)
GFR, Estimated: 7 mL/min — ABNORMAL LOW (ref >60)
Glucose, Bld: 131 mg/dL — ABNORMAL HIGH (ref 70–99)
Phosphorus: 7.6 mg/dL — ABNORMAL HIGH (ref 2.5–4.6)
Potassium: 4.6 mmol/L (ref 3.5–5.1)
Sodium: 134 mmol/L — ABNORMAL LOW (ref 135–145)

## 2023-11-15 LAB — CBC
HCT: 23.9 % — ABNORMAL LOW (ref 39.0–52.0)
Hemoglobin: 7.9 g/dL — ABNORMAL LOW (ref 13.0–17.0)
MCH: 30.4 pg (ref 26.0–34.0)
MCHC: 33.1 g/dL (ref 30.0–36.0)
MCV: 91.9 fL (ref 80.0–100.0)
Platelets: 304 10*3/uL (ref 150–400)
RBC: 2.6 MIL/uL — ABNORMAL LOW (ref 4.22–5.81)
RDW: 14.6 % (ref 11.5–15.5)
WBC: 13.9 10*3/uL — ABNORMAL HIGH (ref 4.0–10.5)
nRBC: 0 % (ref 0.0–0.2)

## 2023-11-15 MED ORDER — EPOETIN ALFA-EPBX 4000 UNIT/ML IJ SOLN
4000.0000 [IU] | INTRAMUSCULAR | Status: DC
  Administered 2023-11-18 – 2023-11-20 (×2): 4000 [IU] via INTRAVENOUS
  Filled 2023-11-15 (×2): qty 1

## 2023-11-15 MED ORDER — EPOETIN ALFA-EPBX 4000 UNIT/ML IJ SOLN
INTRAMUSCULAR | Status: AC
  Filled 2023-11-15: qty 1

## 2023-11-15 NOTE — Progress Notes (Signed)
 PROGRESS NOTE Robert Vega    DOB: April 10, 1941, 83 y.o.  WUJ:[id number]    Code Status: Full Code   DOA: 10/05/2023   LOS: 41  Brief hospital course  Robert Vega is a 83 y.o male with significant PMH of OSA, GERD, Obesity, HTN, Dysphagia - presented to the ED 10/05/2023 from with hypoxia, fever and generalized weakness. Dx sepsis/pneumonia, concern for aspiration    Of note, EGD 03/2022 with food in upper esophagus complicated by aspiration event, cardiac arrest with round of CPR, and post resuscitation EGD with concern for lack of peristalsis. Hx prior EGD 08/2020 with note of abnormal cricopharyngeus, decrease in motility in esophagus, and spastic LES    5/10: Admit to [facility] service with sepsis due to Aspiration Pneumonia.  Course complicated by Acute Respiratory Failure due to Aspiration of vomitus w/ cardiac arrest  transfer to ICU and intubation.  5/11: flexible bronchoscopy  5/13: PEG tube, +vomiting last night, failed SAT/SBT 5/14: extubated but with severe delirium 5/20:  Botulinum toxin injection into the lower esophageal sphincter by Dr. Ole Berkeley 5/21: esophagram showing diffusely distended esophagus with distal obstruction and severe dysmotility suggestive of achalasia. At that point patient was improved to point of pursuing SNF placement.  Complications of PEG tube placement resulted in return to OR 5/27 and tube feeds restarted 5/28 with zosyn  for peritonitis.  Had new melena 6/9 so underwent another EGD without acute bleeding seen.  Renal function declined and nephrology was consulted and without improvement with conservative management, he received dialysis access and started on HD 6/16 as this was in line with family goals of care.  Discharge will now be pending arrangement of facility and outpatient HD if he does not have renal recovery.  He remains in poor prognosis state with intermittently needing to hold tube feeds again for intolerance and needed blood transfusion 6/18 for  anemia likely related more to the renal failure as no source of bleeding has been identified thus far.   Assessment & Plan  Goals of care- Son, Robert Vega will be the primary and only point of contact and we will not discuss separately with granddaughter Robert Vega unless she is on speaker phone or conference call w/ Robert Vega present  - Re code status: Robert Vega confirms that the patient would want CPR and life support if needed, even if unlikely to be successful.  AKI- secondary to ATN. now on HD. Had very little uop and irritation with foley so it was removed 6/19 - continue HD per nephrology  - Monitor UOP - will need outpatient HD set up if in line to continue with family GOC.   Unasyn  was started for suspected UTI. Completing 6/20.  Decreased bowel sounds  Question ileus- resolved. Having regular Bms and seems to be tolerating tube feeds. - Monitor abdominal status frequently and reimage if concern arises.   Anemia 2/2 renal failure and questionable melena- hgb decreased to 6.7 in setting of renal failure without signs of acute bleeding. Received 1 unit pRBCs 6/18 and hgb is stable. Repeat EGD 06/10 no concerns, GI has s/o. HH has been stable. Hgb 7.9 today - CBC am. Maintain hgb goal >7    Achalasia  Dysphagia  Aspiration risk is high- Status post botulinum toxin injection on 5/20.   tube feeds as able  N.p.o. for the foreseeable future per GI SLP to follow  Will need outpatient referral to tertiary advanced GI for consideration of POEMS procedure.  Recommend this only after a few weeks recovery  period.    Severe sepsis - resolved. Initially w/ aspiration pna. 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  VTE ppx: heparin  injection 5,000 Units Start: 11/13/23 2200heparin  Diet:     Diet   Diet NPO time specified Except for: Ice Chips   Consultants: Nephrology  GI  Subjective 11/15/23    Pt reports no complaints. Denies pain.    Objective  Blood pressure 101/70, pulse 86, temperature 97.7 F (36.5 C), temperature source Oral, resp. rate 19, height 5' 9.02 (1.753 m), weight 102.1 kg, SpO2 95%.  Intake/Output Summary (Last 24 hours) at 11/15/2023 0738 Last data filed at 11/14/2023 1819 Gross per 24 hour  Intake 389.56 ml  Output --  Net 389.56 ml   Filed Weights   11/12/23 0828 11/13/23 0500 11/15/23 0333  Weight: 103 kg 102.1 kg 105.3 kg    Physical Exam:  General: awake, alert, NAD HEENT: atraumatic, clear conjunctiva, anicteric sclera, MMM, hearing grossly normal Respiratory: normal respiratory effort. Cardiovascular: quick capillary refill, normal S1/S2, RRR, no JVD, murmurs Gastrointestinal: soft, NT, ND Nervous: A&O xself. Moving all limbs spontaneously Extremities: moves all equally, no edema, normal tone Skin: dry, intact, normal temperature, normal color. No rashes, lesions or ulcers on exposed skin Psychiatry: calm and cooperative. Unable to follow commands.   Labs   I have personally reviewed the following labs and imaging studies CBC    Component Value Date/Time   WBC 12.0 (H) 11/14/2023 0406   RBC 2.83 (L) 11/14/2023 0406   HGB 8.7 (L) 11/14/2023 0406   HCT 26.1 (L) 11/14/2023 0406   PLT 289 11/14/2023 0406   MCV 92.2 11/14/2023 0406   MCH 30.7 11/14/2023 0406   MCHC 33.3 11/14/2023 0406   RDW 14.4 11/14/2023 0406    LYMPHSABS 1.6 10/31/2023 0218   MONOABS 2.3 (H) 10/31/2023 0218   EOSABS 0.1 10/31/2023 0218   BASOSABS 0.1 10/31/2023 0218      Latest Ref Rng & Units 11/13/2023    4:23 AM 11/12/2023   11:05 PM 11/12/2023    7:39 AM  BMP  Glucose 70 - 99 mg/dL 578  469  629   BUN 8 - 23 mg/dL 69  62  89   Creatinine 0.61 - 1.24 mg/dL 5.28  4.13  2.44   Sodium 135 - 145 mmol/L 134  133  133   Potassium 3.5 - 5.1 mmol/L 4.8  4.8  5.9   Chloride 98 - 111 mmol/L 97  96  96   CO2 22 - 32 mmol/L 25  23  25    Calcium 8.9 - 10.3 mg/dL 7.4  7.2  7.4     No results found.   Disposition Plan & Communication  Patient status: Inpatient  Admitted From: Home Planned disposition location: SNF Anticipated discharge date: TBD pending renal function   Family Communication: son on phone   Author: Ree Candy, DO [facility] 11/15/2023,  7:38 AM   Available by Epic secure chat 7AM-7PM. If 7PM-7AM, please contact night-coverage.  [facility] contact information found on [email or web address].

## 2023-11-15 NOTE — Plan of Care (Signed)

## 2023-11-15 NOTE — Evaluation (Addendum)
 Occupational Therapy ReEvaluation Patient Details Name: Robert Vega MRN: [id number] DOB: 01/03/1941 Today's Date: 11/15/2023   History of Present Illness   83 y.o male with significant PMH of OSA, GERD, Obesity, HTN, Dysphagia: EGD 03/2022 with food in upper esophagus complicated by aspiration event, cardiac arrest with round of CPR, and post resuscitation EGD with concern for lack of peristalsis. Pt presented to ED on 10/05/2023 with hypoxia, fever and generalized weakness; developed acute respiratory failure requiring intubation 5/10 due to aspiration pneumonia, extubated 5/15, but had significant agitation required brief course of Precedex ; pt now s/p exploratory laparotomy with closure of gastrotomy and insertion of gastrostomy tube on 10/22/23.  PT order discontinued by MD 11/10/23 and new PT consult received 11/14/23.  Pt s/p R temporary femoral vein dialysis catheter placement 11/11/23.    Clinical Impressions Robert Vega was seen for OT re-evaluation on this date following new orders. Upon arrival to room pt in bed in chair position. Pt responds to name, unable to answer any questions directly. Rambling speech with ~50% intelligible words. Pt requires MOD A face washing with multimmodal cues, assist for thoroughness. MAX A self-feeding ice chips at bed in chair position. Mobility participation limited by agitation and R temporary femoral HD line per MD. Initiated bed level AAROM however pt agitated following ~3 reps. Scrotal edema noted, washcloth roll provided. Goals and discharge recommendation remains appropriate.     If plan is discharge home, recommend the following:   Two people to help with walking and/or transfers;Two people to help with bathing/dressing/bathroom     Functional Status Assessment   Patient has had a recent decline in their functional status and demonstrates the ability to make significant improvements in function in a reasonable and predictable amount of  time.     Equipment Recommendations   Other (comment) (defer)     Recommendations for Other Services         Precautions/Restrictions   Precautions Precautions: Fall Recall of Precautions/Restrictions: Impaired Precaution/Restrictions Comments: PEG tube; R temporary fem catheter precautions Restrictions Weight Bearing Restrictions Per Provider Order: No Other Position/Activity Restrictions: Safety mitts donned     Mobility Bed Mobility Overal bed mobility: Needs Assistance             General bed mobility comments: deferred 2/2 increased agitation with activity    Transfers                   General transfer comment: deferred per nephrology bed rest with temp fem line          ADL either performed or assessed with clinical judgement   ADL Overall ADL's : Needs assistance/impaired                                       General ADL Comments: MOD A face washing with multimmodal cues, assist for thoroughness. MAX A self-feeding ice chips at bed in chair position.      Vision         Perception         Praxis         Pertinent Vitals/Pain Pain Assessment Pain Assessment: PAINAD Breathing: normal Negative Vocalization: occasional moan/groan, low speech, negative/disapproving quality Facial Expression: smiling or inexpressive Body Language: relaxed Consolability: distracted or reassured by voice/touch PAINAD Score: 2 Pain Location: LLE with AROM Pain Descriptors / Indicators: Discomfort Pain Intervention(s): Limited activity within patient's tolerance,  Repositioned     Extremity/Trunk Assessment Upper Extremity Assessment Upper Extremity Assessment: Generalized weakness   Lower Extremity Assessment Lower Extremity Assessment: Generalized weakness       Communication Communication Communication: Impaired Factors Affecting Communication: Reduced clarity of speech;Difficulty expressing self   Cognition Arousal:  Alert Behavior During Therapy: Agitated Cognition: Cognition impaired   Orientation impairments: Place, Time, Situation         OT - Cognition Comments: pt responds to name, unable to answer any questions directly. Rambling speech with ~50% intelligible words                 Following commands: Impaired       Cueing  General Comments   Cueing Techniques: Verbal cues;Gestural cues;Tactile cues      Exercises     Shoulder Instructions      Home Living Family/patient expects to be discharged to:: Private residence Living Arrangements: Alone Available Help at Discharge: Family;Available 24 hours/day Type of Home: House Home Access: Stairs to enter [employer] of Steps: 2   Home Layout: One level     Bathroom Shower/Tub: Tub/shower unit         Home Equipment: None   Additional Comments: Home setup per granddaughter in room on original PT eval; reports pt has girlfriend who could stay 24/7 or pt could go to girlfriends house.      Prior Functioning/Environment Prior Level of Function : Independent/Modified Independent;History of Falls (last six months)             Mobility Comments: History of falls on uneven ground (garden and front steps)      OT Problem List: Decreased strength;Decreased activity tolerance;Decreased safety awareness;Impaired balance (sitting and/or standing);Decreased knowledge of use of DME or AE;Decreased cognition   OT Treatment/Interventions: Self-care/ADL training;Therapeutic exercise;Therapeutic activities;Energy conservation;DME and/or AE instruction;Patient/family education;Balance training;Cognitive remediation/compensation      OT Goals(Current goals can be found in the care plan section)   Acute Rehab OT Goals Patient Stated Goal: to be left alone OT Goal Formulation: Patient unable to participate in goal setting Time For Goal Achievement: 11/29/23 Potential to Achieve Goals: Fair   OT Frequency:   Min 1X/week    Co-evaluation              AM-PAC OT 6 Clicks Daily Activity     Outcome Measure Help from another person eating meals?: A Lot Help from another person taking care of personal grooming?: A Lot Help from another person toileting, which includes using toliet, bedpan, or urinal?: A Lot Help from another person bathing (including washing, rinsing, drying)?: A Lot Help from another person to put on and taking off regular upper body clothing?: A Lot Help from another person to put on and taking off regular lower body clothing?: A Lot 6 Click Score: 12   End of Session    Activity Tolerance: Treatment limited secondary to agitation Patient left: in bed;with call bell/phone within reach;with bed alarm set;with nursing/sitter in room  OT Visit Diagnosis: Unsteadiness on feet (R26.81);Repeated falls (R29.6);Muscle weakness (generalized) (M62.81)                Time: 9811-9147 OT Time Calculation (min): 30 min Charges:  OT General Charges $OT Visit: 1 Visit OT Evaluation $OT Re-eval: 1 Re-eval OT Treatments $Self Care/Home Management : 8-22 mins  Gordan Latina, M.S. OTR/L  11/15/23, 4:06 PM  ascom [phone]

## 2023-11-15 NOTE — Progress Notes (Signed)
 Pt'S grand daughter [name] called and enquired about the Pt's clear liquid diet being reordered. Apparently a renal diet was ordered and then DC'ed but the clear diet that he had before was not reordered. Speech had cleared the PT to eat.  [name] also asked whether the Pt. will be having any more HD, she would also like the MD to call her concerning these issues today please.

## 2023-11-15 NOTE — Progress Notes (Signed)
 Hemodialysis Note:  Received patient in bed to unit. Alert and oriented. Informed consent singed and in chart.  Treatment initiated: 0900 Treatment completed: 1235  Access used: Right Femoral catheter Access issues: None  Patient tolerated well. Transported back to room, alert without acute distress. Report given to patient's RN.  Total UF removed: 1 liter Medications given: Retacrit 4000 units IV  Post HD weight: 101.9 Kg  [facility]

## 2023-11-15 NOTE — Progress Notes (Signed)
 [facility]  ROUNDING NOTE   Subjective:   Patient seen and evaluated during hemodialysis treatment today. UF target 1 kg. Still lethargic but arousable.   Objective:  Vital signs in last 24 hours:  Temp:  [97.9 F (36.6 C)-98.7 F (37.1 C)] 98.7 F (37.1 C) (06/20 0835) Pulse Rate:  [79-88] 87 (06/20 1200) Resp:  [16-34] 23 (06/20 1200) BP: (111-142)/(61-84) 116/66 (06/20 1200) SpO2:  [95 %-100 %] 100 % (06/20 1200) Weight:  [102.9 kg-105.3 kg] 102.9 kg (06/20 0835)  Weight change:  Filed Weights   11/13/23 0500 11/15/23 0333 11/15/23 0835  Weight: 102.1 kg 105.3 kg 102.9 kg    Intake/Output: I/O last 3 completed shifts: In: 1654.4 [Blood:366; NG/GT:1150.1; IV Piggyback:138.2] Out: 0    Intake/Output this shift:  Total I/O In: 991.3 [NG/GT:991.3] Out: -   Physical Exam: General: Chronically ill-appearing  Head: Dry mucosal membranes  Eyes: Anicteri  Lungs:   O2,  coarse breath sounds  Heart: Regular rate and rhythm  Abdomen:  Peg tube in place, midline incision, distended  Extremities: Trace peripheral edema.  Neurologic: Lethargic but arousable  Skin: No lesions  Access: Femoral dialysis catheter  Foley catheter in place  Basic Metabolic Panel: Recent Labs  Lab 11/09/23 0404 11/10/23 0537 11/11/23 1853 11/12/23 0357 11/12/23 0739 11/12/23 2305 11/13/23 0423 11/14/23 0406 11/15/23 0900  NA 145   < > 134*  --  133* 133* 134*  --  134*  K 4.6   < > 7.0*  --  5.9* 4.8 4.8  --  4.6  CL 110   < > 100  --  96* 96* 97*  --  97*  CO2 25   < > 19*  --  25 23 25   --  26  GLUCOSE 118*   < > 116*  --  104* 103* 122*  --  131*  BUN 60*   < > 115*  --  89* 62* 69*  --  69*  CREATININE 2.73*   < > 7.67*  --  6.21* 4.94* 5.26*  --  6.92*  CALCIUM 7.7*   < > 7.7*  --  7.4* 7.2* 7.4*  --  6.8*  MG 2.5*  --   --  2.2  --   --  2.1 2.1  --   PHOS 4.7*  --   --   --  7.1* 6.6*  --   --  7.6*   < > = values in this interval not displayed.     Liver Function Tests: Recent Labs  Lab 11/10/23 1544 11/12/23 0739 11/12/23 2305 11/13/23 0423 11/15/23 0900  AST 73*  --   --  65*  --   ALT 84*  --   --  76*  --   ALKPHOS 92  --   --  92  --   BILITOT 0.8  --   --  0.5  --   PROT 6.4*  --   --  6.3*  --   ALBUMIN 1.9* 1.7* 1.6* 1.7* 1.7*   No results for input(s): LIPASE, AMYLASE in the last 168 hours. No results for input(s): AMMONIA in the last 168 hours.  CBC: Recent Labs  Lab 11/11/23 0403 11/12/23 0357 11/13/23 0423 11/14/23 0406 11/15/23 0900  WBC 17.1* 16.1* 11.4* 12.0* 13.9*  HGB 8.0* 7.6* 6.7* 8.7* 7.9*  HCT 24.2* 23.4* 21.0* 26.1* 23.9*  MCV 91.3 91.8 92.1 92.2 91.9  PLT 374 333 309 289 304    Cardiac  Enzymes: No results for input(s): CKTOTAL, CKMB, CKMBINDEX, TROPONINI in the last 168 hours.  BNP: Invalid input(s): POCBNP  CBG: Recent Labs  Lab 11/14/23 1538 11/14/23 1951 11/14/23 2308 11/15/23 0330 11/15/23 0749  GLUCAP 132* 138* 138* 139* 108*    Microbiology: Results for orders placed or performed during the hospital encounter of 10/05/23  Blood Culture (routine x 2)     Status: None   Collection Time: 10/05/23  5:06 AM   Specimen: BLOOD  Result Value Ref Range Status   Specimen Description BLOOD LA  Final   Special Requests   Final    BOTTLES DRAWN AEROBIC AND ANAEROBIC Blood Culture results may not be optimal due to an inadequate volume of blood received in culture bottles   Culture   Final    NO GROWTH 5 DAYS Performed at [facility], [address] [place], [place] [address]    Report Status 10/10/2023 FINAL  Final  Blood Culture (routine x 2)     Status: None   Collection Time: 10/05/23  5:07 AM   Specimen: BLOOD  Result Value Ref Range Status   Specimen Description BLOOD RA  Final   Special Requests   Final    BOTTLES DRAWN AEROBIC AND ANAEROBIC Blood Culture results may not be optimal due to an inadequate volume of blood received in culture  bottles   Culture   Final    NO GROWTH 5 DAYS Performed at [facility], [address] [place], [place] [address]    Report Status 10/10/2023 FINAL  Final  Resp panel by RT-PCR (RSV, Flu A&B, Covid) Anterior Nasal Swab     Status: None   Collection Time: 10/05/23  5:56 AM   Specimen: Anterior Nasal Swab  Result Value Ref Range Status   SARS Coronavirus 2 by RT PCR NEGATIVE NEGATIVE Final    Comment: (NOTE) SARS-CoV-2 target nucleic acids are NOT DETECTED.  The SARS-CoV-2 RNA is generally detectable in upper respiratory specimens during the acute phase of infection. The lowest concentration of SARS-CoV-2 viral copies this assay can detect is 138 copies/mL. A negative result does not preclude SARS-Cov-2 infection and should not be used as the sole basis for treatment or other patient management decisions. A negative result may occur with  improper specimen collection/handling, submission of specimen other than nasopharyngeal swab, presence of viral mutation(s) within the areas targeted by this assay, and inadequate number of viral copies(<138 copies/mL). A negative result must be combined with clinical observations, patient history, and epidemiological information. The expected result is Negative.  Fact Sheet for Patients:  BloggerCourse.com  Fact Sheet for Healthcare Providers:  SeriousBroker.it  This test is no t yet approved or cleared by the [place]  FDA and  has been authorized for detection and/or diagnosis of SARS-CoV-2 by FDA under an Emergency Use Authorization (EUA). This EUA will remain  in effect (meaning this test can be used) for the duration of the COVID-19 declaration under Section 564(b)(1) of the Act, 21 U.S.C.section 360bbb-3(b)(1), unless the authorization is terminated  or revoked sooner.       Influenza A by PCR NEGATIVE NEGATIVE Final   Influenza B by PCR NEGATIVE NEGATIVE Final     Comment: (NOTE) The Xpert Xpress SARS-CoV-2/FLU/RSV plus assay is intended as an aid in the diagnosis of influenza from Nasopharyngeal swab specimens and should not be used as a sole basis for treatment. Nasal washings and aspirates are unacceptable for Xpert Xpress SARS-CoV-2/FLU/RSV testing.  Fact Sheet for  Patients: BloggerCourse.com  Fact Sheet for Healthcare Providers: SeriousBroker.it  This test is not yet approved or cleared by the [place]  FDA and has been authorized for detection and/or diagnosis of SARS-CoV-2 by FDA under an Emergency Use Authorization (EUA). This EUA will remain in effect (meaning this test can be used) for the duration of the COVID-19 declaration under Section 564(b)(1) of the Act, 21 U.S.C. section 360bbb-3(b)(1), unless the authorization is terminated or revoked.     Resp Syncytial Virus by PCR NEGATIVE NEGATIVE Final    Comment: (NOTE) Fact Sheet for Patients: BloggerCourse.com  Fact Sheet for Healthcare Providers: SeriousBroker.it  This test is not yet approved or cleared by the [place]  FDA and has been authorized for detection and/or diagnosis of SARS-CoV-2 by FDA under an Emergency Use Authorization (EUA). This EUA will remain in effect (meaning this test can be used) for the duration of the COVID-19 declaration under Section 564(b)(1) of the Act, 21 U.S.C. section 360bbb-3(b)(1), unless the authorization is terminated or revoked.  Performed at  [facility], [address] [place], [place] [address]   Respiratory (~20 pathogens) panel by PCR     Status: None   Collection Time: 10/05/23  8:11 AM   Specimen: Nasopharyngeal Swab; Respiratory  Result Value Ref Range Status   Adenovirus NOT DETECTED NOT DETECTED Final   Coronavirus 229E NOT DETECTED NOT DETECTED Final    Comment: (NOTE) The Coronavirus on the Respiratory  Panel, DOES NOT test for the novel  Coronavirus (2019 nCoV)    Coronavirus HKU1 NOT DETECTED NOT DETECTED Final   Coronavirus NL63 NOT DETECTED NOT DETECTED Final   Coronavirus OC43 NOT DETECTED NOT DETECTED Final   Metapneumovirus NOT DETECTED NOT DETECTED Final   Rhinovirus / Enterovirus NOT DETECTED NOT DETECTED Final   Influenza A NOT DETECTED NOT DETECTED Final   Influenza B NOT DETECTED NOT DETECTED Final   Parainfluenza Virus 1 NOT DETECTED NOT DETECTED Final   Parainfluenza Virus 2 NOT DETECTED NOT DETECTED Final   Parainfluenza Virus 3 NOT DETECTED NOT DETECTED Final   Parainfluenza Virus 4 NOT DETECTED NOT DETECTED Final   Respiratory Syncytial Virus NOT DETECTED NOT DETECTED Final   Bordetella pertussis NOT DETECTED NOT DETECTED Final   Bordetella Parapertussis NOT DETECTED NOT DETECTED Final   Chlamydophila pneumoniae NOT DETECTED NOT DETECTED Final   Mycoplasma pneumoniae NOT DETECTED NOT DETECTED Final    Comment: Performed at [facility], [address], [place], [place] [address]  Expectorated Sputum Assessment w Gram Stain, Rflx to Resp Cult     Status: None   Collection Time: 10/05/23  9:07 AM   Specimen: Sputum  Result Value Ref Range Status   Specimen Description SPUTUM  Final   Special Requests NONE  Final   Sputum evaluation   Final    Sputum specimen not acceptable for testing.  Please recollect.   C/KERRY NELSON AT 1005 10/05/23.PMF Performed at [facility], [address] [place], [place] [address]    Report Status 10/05/2023 FINAL  Final  Expectorated Sputum Assessment w Gram Stain, Rflx to Resp Cult     Status: None   Collection Time: 10/05/23 10:50 AM  Result Value Ref Range Status   Specimen Description EXPECTORATED SPUTUM  Final   Special Requests NONE  Final   Sputum evaluation   Final    THIS SPECIMEN IS ACCEPTABLE FOR SPUTUM CULTURE Performed at [facility], [address], [place], [place] [address]  Report  Status 10/05/2023 FINAL  Final  Culture, Respiratory w Gram Stain     Status: None   Collection Time: 10/05/23 10:50 AM  Result Value Ref Range Status   Specimen Description   Final    EXPECTORATED SPUTUM Performed at [facility], [address] [place], [place] [address]    Special Requests   Final    NONE Reflexed from [id number] Performed at [facility], [address] [place], [place] [address]    Gram Stain   Final    RARE WBC SEEN RARE Hillis Lu POSITIVE RODS RARE Hillis Lu POSITIVE COCCI RARE GRAM NEGATIVE RODS    Culture   Final    FEW Normal respiratory flora-no Staph aureus or Pseudomonas seen Performed at [facility], [address] [place], [place] [address]    Report Status 10/07/2023 FINAL  Final  MRSA Next Gen by PCR, Nasal     Status: None   Collection Time: 10/06/23 12:57 AM   Specimen: Nasal Mucosa; Nasal Swab  Result Value Ref Range Status   MRSA by PCR Next Gen NOT DETECTED NOT DETECTED Final    Comment: (NOTE) The GeneXpert MRSA Assay (FDA approved for NASAL specimens only), is one component of a comprehensive MRSA colonization surveillance program. It is not intended to diagnose MRSA infection nor to guide or monitor treatment for MRSA infections. Test performance is not FDA approved in patients less than 41 years old. Performed at [facility], [address] [place], [place] [address]   Culture, Respiratory w Gram Stain     Status: None   Collection Time: 10/06/23 11:37 AM   Specimen: INDUCED SPUTUM  Result Value Ref Range Status   Specimen Description   Final    INDUCED SPUTUM Performed at [facility], [address], [place], [place] [address]    Special Requests   Final    NONE Performed at [facility], [address] [place], [place] [address]    Gram Stain   Final    FEW WBC PRESENT,BOTH PMN AND MONONUCLEAR FEW GRAM POSITIVE RODS    Culture   Final    MODERATE LACTOBACILLUS  FERMENTUM Standardized susceptibility testing for this organism is not available. Performed at [facility], [address], [place], [place] [address]    Report Status 10/09/2023 FINAL  Final  Culture, blood (x 2)     Status: None   Collection Time: 10/22/23  1:00 AM   Specimen: BLOOD  Result Value Ref Range Status   Specimen Description BLOOD BLOOD RIGHT ARM  Final   Special Requests   Final    BOTTLES DRAWN AEROBIC AND ANAEROBIC Blood Culture adequate volume   Culture   Final    NO GROWTH 5 DAYS Performed at [facility], [address] [place], [place] [address]    Report Status 10/27/2023 FINAL  Final  Culture, blood (x 2)     Status: None   Collection Time: 10/22/23  1:00 AM   Specimen: BLOOD  Result Value Ref Range Status   Specimen Description BLOOD BLOOD RIGHT ARM  Final   Special Requests   Final    BOTTLES DRAWN AEROBIC AND ANAEROBIC Blood Culture adequate volume   Culture   Final    NO GROWTH 5 DAYS Performed at [facility], [address], [place], [place] [address]    Report Status 10/27/2023 FINAL  Final  Resp panel by RT-PCR (RSV, Flu A&B, Covid)  Anterior Nasal Swab     Status: None   Collection Time: 11/09/23  5:31 PM   Specimen: Anterior Nasal Swab  Result Value Ref Range Status   SARS Coronavirus 2 by RT PCR NEGATIVE NEGATIVE Final    Comment: (NOTE) SARS-CoV-2 target nucleic acids are NOT DETECTED.  The SARS-CoV-2 RNA is generally detectable in upper respiratory specimens during the acute phase of infection. The lowest concentration of SARS-CoV-2 viral copies this assay can detect is 138 copies/mL. A negative result does not preclude SARS-Cov-2 infection and should not be used as the sole basis for treatment or other patient management decisions. A negative result may occur with  improper specimen collection/handling, submission of specimen other than nasopharyngeal swab, presence of viral mutation(s) within the areas  targeted by this assay, and inadequate number of viral copies(<138 copies/mL). A negative result must be combined with clinical observations, patient history, and epidemiological information. The expected result is Negative.  Fact Sheet for Patients:  BloggerCourse.com  Fact Sheet for Healthcare Providers:  SeriousBroker.it  This test is no t yet approved or cleared by the [place]  FDA and  has been authorized for detection and/or diagnosis of SARS-CoV-2 by FDA under an Emergency Use Authorization (EUA). This EUA will remain  in effect (meaning this test can be used) for the duration of the COVID-19 declaration under Section 564(b)(1) of the Act, 21 U.S.C.section 360bbb-3(b)(1), unless the authorization is terminated  or revoked sooner.       Influenza A by PCR NEGATIVE NEGATIVE Final   Influenza B by PCR NEGATIVE NEGATIVE Final    Comment: (NOTE) The Xpert Xpress SARS-CoV-2/FLU/RSV plus assay is intended as an aid in the diagnosis of influenza from Nasopharyngeal swab specimens and should not be used as a sole basis for treatment. Nasal washings and aspirates are unacceptable for Xpert Xpress SARS-CoV-2/FLU/RSV testing.  Fact Sheet for Patients: BloggerCourse.com  Fact Sheet for Healthcare Providers: SeriousBroker.it  This test is not yet approved or cleared by the [place]  FDA and has been authorized for detection and/or diagnosis of SARS-CoV-2 by FDA under an Emergency Use Authorization (EUA). This EUA will remain in effect (meaning this test can be used) for the duration of the COVID-19 declaration under Section 564(b)(1) of the Act, 21 U.S.C. section 360bbb-3(b)(1), unless the authorization is terminated or revoked.     Resp Syncytial Virus by PCR NEGATIVE NEGATIVE Final    Comment: (NOTE) Fact Sheet for  Patients: BloggerCourse.com  Fact Sheet for Healthcare Providers: SeriousBroker.it  This test is not yet approved or cleared by the [place]  FDA and has been authorized for detection and/or diagnosis of SARS-CoV-2 by FDA under an Emergency Use Authorization (EUA). This EUA will remain in effect (meaning this test can be used) for the duration of the COVID-19 declaration under Section 564(b)(1) of the Act, 21 U.S.C. section 360bbb-3(b)(1), unless the authorization is terminated or revoked.  Performed at [facility], [address] [place], [place] [address]   Respiratory (~20 pathogens) panel by PCR     Status: None   Collection Time: 11/09/23  5:31 PM   Specimen: Nasopharyngeal Swab; Respiratory  Result Value Ref Range Status   Adenovirus NOT DETECTED NOT DETECTED Final   Coronavirus 229E NOT DETECTED NOT DETECTED Final    Comment: (NOTE) The Coronavirus on the Respiratory Panel, DOES NOT test for the novel  Coronavirus (2019 nCoV)    Coronavirus HKU1 NOT DETECTED NOT DETECTED Final   Coronavirus NL63 NOT DETECTED NOT DETECTED  Final   Coronavirus OC43 NOT DETECTED NOT DETECTED Final   Metapneumovirus NOT DETECTED NOT DETECTED Final   Rhinovirus / Enterovirus NOT DETECTED NOT DETECTED Final   Influenza A NOT DETECTED NOT DETECTED Final   Influenza B NOT DETECTED NOT DETECTED Final   Parainfluenza Virus 1 NOT DETECTED NOT DETECTED Final   Parainfluenza Virus 2 NOT DETECTED NOT DETECTED Final   Parainfluenza Virus 3 NOT DETECTED NOT DETECTED Final   Parainfluenza Virus 4 NOT DETECTED NOT DETECTED Final   Respiratory Syncytial Virus NOT DETECTED NOT DETECTED Final   Bordetella pertussis NOT DETECTED NOT DETECTED Final   Bordetella Parapertussis NOT DETECTED NOT DETECTED Final   Chlamydophila pneumoniae NOT DETECTED NOT DETECTED Final   Mycoplasma pneumoniae NOT DETECTED NOT DETECTED Final    Comment: Performed at  [facility], [address], [place], [place] [address]  Culture, blood (Routine X 2) w Reflex to ID Panel     Status: None (Preliminary result)   Collection Time: 11/09/23  6:16 PM   Specimen: BLOOD  Result Value Ref Range Status   Specimen Description BLOOD RIGHT ANTECUBITAL  Final   Special Requests   Final    BOTTLES DRAWN AEROBIC ONLY Blood Culture adequate volume   Culture   Final    NO GROWTH 4 DAYS Performed at [facility], [address] [place], [place] [address]    Report Status PENDING  Incomplete  Culture, blood (Routine X 2) w Reflex to ID Panel     Status: None (Preliminary result)   Collection Time: 11/09/23  6:23 PM   Specimen: BLOOD  Result Value Ref Range Status   Specimen Description BLOOD BLOOD LEFT FOREARM  Final   Special Requests   Final    BOTTLES DRAWN AEROBIC AND ANAEROBIC Blood Culture adequate volume   Culture   Final    NO GROWTH 4 DAYS Performed at [facility], [address] [place], [place] [address]    Report Status PENDING  Incomplete  Urine Culture     Status: Abnormal   Collection Time: 11/10/23  1:58 AM   Specimen: Urine, Random  Result Value Ref Range Status   Specimen Description   Final    URINE, RANDOM Performed at [facility], [address] [place], [place] [address]    Special Requests   Final    NONE Reflexed from [id number] Performed at [facility], [address] [place], [place] [address]    Culture 60,000 COLONIES/mL ENTEROCOCCUS FAECALIS (A)  Final   Report Status 11/12/2023 FINAL  Final   Organism ID, Bacteria ENTEROCOCCUS FAECALIS (A)  Final      Susceptibility   Enterococcus faecalis - MIC*    AMPICILLIN  <=2 SENSITIVE Sensitive     NITROFURANTOIN <=16 SENSITIVE Sensitive     VANCOMYCIN 1 SENSITIVE Sensitive     * 60,000 COLONIES/mL ENTEROCOCCUS FAECALIS    Coagulation Studies: No results for input(s): LABPROT, INR in the last 72 hours.  Urinalysis: No  results for input(s): COLORURINE, LABSPEC, PHURINE, GLUCOSEU, HGBUR, BILIRUBINUR, KETONESUR, PROTEINUR, UROBILINOGEN, NITRITE, LEUKOCYTESUR in the last 72 hours.  Invalid input(s): APPERANCEUR     Imaging: No results found.    Medications:    ampicillin -sulbactam (UNASYN ) IV Stopped (11/15/23 0952)   feeding supplement (OSMOLITE 1.5 CAL) 65 mL/hr at 11/15/23 0830    sodium chloride    Intravenous Once   Chlorhexidine  Gluconate Cloth  6 each Topical Daily   Chlorhexidine  Gluconate Cloth  6  each Topical Q0600   [START ON 11/18/2023] epoetin alfa-epbx (RETACRIT) injection  4,000 Units Intravenous Q M,W,F-HD   free water   30 mL Per Tube Q4H   gentamicin  ointment   Topical TID   heparin  injection (subcutaneous)  5,000 Units Subcutaneous Q8H   insulin  aspart  10 Units Subcutaneous Once   multivitamin  1 tablet Per Tube QHS   pantoprazole  (PROTONIX ) IV  40 mg Intravenous Q12H   sodium zirconium cyclosilicate  5 g Per Tube Daily   acetaminophen  **OR** acetaminophen , artificial tears, bisacodyl , ipratropium-albuterol , [DISCONTINUED] ondansetron  **OR** ondansetron  (ZOFRAN ) IV, mouth rinse  Assessment/ Plan:  Robert Vega is a 83 y.o.  male with obstructive sleep apnea, GERD, obesity, hypertension, dysphagia, severe esophageal dysmotility with achalasia status post PEG tube placement, recent aspiration pneumonia acute respiratory failure status post extubation 10/09/2023, who was admitted to [facility] on 10/05/2023 for Aspiration into respiratory tract, initial encounter [T17.908A] Severe sepsis (HCC) [A41.9, R65.20] History of dysphagia [Z87.898] Fever, unspecified fever cause [R50.9] Multifocal pneumonia [J18.9]   1.  Acute kidney injury.  Baseline creatinine 0.81 from 10/14/2023.  Acute kidney injury secondary to ATN, obstructive uropathy, and progression of prerenal azotemia.   Patient is critically ill.  He was started on urgent hemodialysis for severe  hyperkalemia and uremia. -Plan for short-term dialysis to see if clearing of uremia and restoration of electrolyte balance helps improved clinical condition. - Patient seen during dialysis treatment today.  Tolerating well.  Still lethargic but is arousable.  It does not appear that dialysis has made a significant difference in his underlying mental status.   2.  Severe hyperkalemia-potassium acceptable at 4.6.   3.  Originally admitted for sepsis due to aspiration pneumonia, found to have distal esophageal obstruction with severe dysmotility.  He is status post botulinum toxin injection to the lower esophageal sphincter.  PEG tube placed this admission.    4.Anemia in the setting of renal failure Lab Results  Component Value Date   HGB 7.9 (L) 11/15/2023  Continue Epogen 4000's IV with dialysis.   5.  Bilateral hydronephrosis Noted on CT.Urology team has evaluated the patient and it is thought to be secondary to reflux.  No intervention at this time due to fragile health.     LOS: 41 Jena Tegeler 6/20/202512:18 PM

## 2023-11-16 ENCOUNTER — Inpatient Hospital Stay

## 2023-11-16 DIAGNOSIS — N186 End stage renal disease: Secondary | ICD-10-CM | POA: Diagnosis not present

## 2023-11-16 DIAGNOSIS — F0390 Unspecified dementia without behavioral disturbance: Secondary | ICD-10-CM | POA: Diagnosis not present

## 2023-11-16 DIAGNOSIS — R6520 Severe sepsis without septic shock: Secondary | ICD-10-CM | POA: Diagnosis not present

## 2023-11-16 DIAGNOSIS — A419 Sepsis, unspecified organism: Secondary | ICD-10-CM | POA: Diagnosis not present

## 2023-11-16 LAB — GLUCOSE, CAPILLARY
Glucose-Capillary: 127 mg/dL — ABNORMAL HIGH (ref 70–99)
Glucose-Capillary: 130 mg/dL — ABNORMAL HIGH (ref 70–99)
Glucose-Capillary: 133 mg/dL — ABNORMAL HIGH (ref 70–99)
Glucose-Capillary: 137 mg/dL — ABNORMAL HIGH (ref 70–99)
Glucose-Capillary: 139 mg/dL — ABNORMAL HIGH (ref 70–99)
Glucose-Capillary: 142 mg/dL — ABNORMAL HIGH (ref 70–99)

## 2023-11-16 NOTE — Progress Notes (Signed)
 Pt. noted that he felt tired this morning. I asked him if he slept good and he said that he did not. Pt did have some periods of apnea during sleep and he  does have a HX of OSA. Would a CPAP be something that can be considered for him at HS? Thanks

## 2023-11-16 NOTE — Plan of Care (Signed)

## 2023-11-16 NOTE — Progress Notes (Addendum)
 [facility]  ROUNDING NOTE   Subjective:  No acute events overnight. Patient alert with aphasia.   Objective:  Vital signs in last 24 hours:  Temp:  [97.7 F (36.5 C)-99 F (37.2 C)] 97.8 F (36.6 C) (06/21 0728) Pulse Rate:  [75-91] 86 (06/21 0728) Resp:  [18-28] 18 (06/21 0728) BP: (105-132)/(57-77) 105/59 (06/21 0728) SpO2:  [95 %-100 %] 95 % (06/21 0728) Weight:  [101.9 kg-104.7 kg] 104.7 kg (06/21 0401)  Weight change: -2.4 kg Filed Weights   11/15/23 0835 11/15/23 1239 11/16/23 0401  Weight: 102.9 kg 101.9 kg 104.7 kg    Intake/Output: I/O last 3 completed shifts: In: 1413.8 [NG/GT:1413.8] Out: 1000 [Other:1000]   Intake/Output this shift:  No intake/output data recorded.  Physical Exam: General: NAD,   Head: Normocephalic, atraumatic. Moist oral mucosal membranes  Eyes: Anicteric, PERRL  Neck: Supple, trachea midline  Lungs:  Clear to auscultation  Heart: Regular rate and rhythm  Abdomen:  Soft, nontender,   Extremities:  None peripheral edema.  Neurologic: Nonfocal, moving all four extremities  Skin: No lesions  Access: Rt temp fem cath    Basic Metabolic Panel: Recent Labs  Lab 11/11/23 1853 11/12/23 0357 11/12/23 0739 11/12/23 2305 11/13/23 0423 11/14/23 0406 11/15/23 0900  NA 134*  --  133* 133* 134*  --  134*  K 7.0*  --  5.9* 4.8 4.8  --  4.6  CL 100  --  96* 96* 97*  --  97*  CO2 19*  --  25 23 25   --  26  GLUCOSE 116*  --  104* 103* 122*  --  131*  BUN 115*  --  89* 62* 69*  --  69*  CREATININE 7.67*  --  6.21* 4.94* 5.26*  --  6.92*  CALCIUM  7.7*  --  7.4* 7.2* 7.4*  --  6.8*  MG  --  2.2  --   --  2.1 2.1  --   PHOS  --   --  7.1* 6.6*  --   --  7.6*    Liver Function Tests: Recent Labs  Lab 11/10/23 1544 11/12/23 0739 11/12/23 2305 11/13/23 0423 11/15/23 0900  AST 73*  --   --  65*  --   ALT 84*  --   --  76*  --   ALKPHOS 92  --   --  92  --   BILITOT 0.8  --   --  0.5  --   PROT 6.4*  --   --  6.3*  --    ALBUMIN 1.9* 1.7* 1.6* 1.7* 1.7*   No results for input(s): LIPASE, AMYLASE in the last 168 hours. No results for input(s): AMMONIA in the last 168 hours.  CBC: Recent Labs  Lab 11/11/23 0403 11/12/23 0357 11/13/23 0423 11/14/23 0406 11/15/23 0900  WBC 17.1* 16.1* 11.4* 12.0* 13.9*  HGB 8.0* 7.6* 6.7* 8.7* 7.9*  HCT 24.2* 23.4* 21.0* 26.1* 23.9*  MCV 91.3 91.8 92.1 92.2 91.9  PLT 374 333 309 289 304    Cardiac Enzymes: No results for input(s): CKTOTAL, CKMB, CKMBINDEX, TROPONINI in the last 168 hours.  BNP: Invalid input(s): POCBNP  CBG: Recent Labs  Lab 11/15/23 1307 11/15/23 1957 11/15/23 2340 11/16/23 0321 11/16/23 0756  GLUCAP 143* 137* 129* 142* 130*    Microbiology: Results for orders placed or performed during the hospital encounter of 10/05/23  Blood Culture (routine x 2)     Status: None   Collection Time: 10/05/23  5:06 AM   Specimen: BLOOD  Result Value Ref Range Status   Specimen Description BLOOD LA  Final   Special Requests   Final    BOTTLES DRAWN AEROBIC AND ANAEROBIC Blood Culture results may not be optimal due to an inadequate volume of blood received in culture bottles   Culture   Final    NO GROWTH 5 DAYS Performed at [facility], [address] [place], [place] [address]    Report Status 10/10/2023 FINAL  Final  Blood Culture (routine x 2)     Status: None   Collection Time: 10/05/23  5:07 AM   Specimen: BLOOD  Result Value Ref Range Status   Specimen Description BLOOD RA  Final   Special Requests   Final    BOTTLES DRAWN AEROBIC AND ANAEROBIC Blood Culture results may not be optimal due to an inadequate volume of blood received in culture bottles   Culture   Final    NO GROWTH 5 DAYS Performed at [facility], [address] [place], [place] [address]    Report Status 10/10/2023 FINAL  Final  Resp panel by RT-PCR (RSV, Flu A&B, Covid) Anterior Nasal Swab     Status: None   Collection Time:  10/05/23  5:56 AM   Specimen: Anterior Nasal Swab  Result Value Ref Range Status   SARS Coronavirus 2 by RT PCR NEGATIVE NEGATIVE Final    Comment: (NOTE) SARS-CoV-2 target nucleic acids are NOT DETECTED.  The SARS-CoV-2 RNA is generally detectable in upper respiratory specimens during the acute phase of infection. The lowest concentration of SARS-CoV-2 viral copies this assay can detect is 138 copies/mL. A negative result does not preclude SARS-Cov-2 infection and should not be used as the sole basis for treatment or other patient management decisions. A negative result may occur with  improper specimen collection/handling, submission of specimen other than nasopharyngeal swab, presence of viral mutation(s) within the areas targeted by this assay, and inadequate number of viral copies(<138 copies/mL). A negative result must be combined with clinical observations, patient history, and epidemiological information. The expected result is Negative.  Fact Sheet for Patients:  BloggerCourse.com  Fact Sheet for Healthcare Providers:  SeriousBroker.it  This test is no t yet approved or cleared by the [place]  FDA and  has been authorized for detection and/or diagnosis of SARS-CoV-2 by FDA under an Emergency Use Authorization (EUA). This EUA will remain  in effect (meaning this test can be used) for the duration of the COVID-19 declaration under Section 564(b)(1) of the Act, 21 U.S.C.section 360bbb-3(b)(1), unless the authorization is terminated  or revoked sooner.       Influenza A by PCR NEGATIVE NEGATIVE Final   Influenza B by PCR NEGATIVE NEGATIVE Final    Comment: (NOTE) The Xpert Xpress SARS-CoV-2/FLU/RSV plus assay is intended as an aid in the diagnosis of influenza from Nasopharyngeal swab specimens and should not be used as a sole basis for treatment. Nasal washings and aspirates are unacceptable for Xpert Xpress  SARS-CoV-2/FLU/RSV testing.  Fact Sheet for Patients: BloggerCourse.com  Fact Sheet for Healthcare Providers: SeriousBroker.it  This test is not yet approved or cleared by the [place]  FDA and has been authorized for detection and/or diagnosis of SARS-CoV-2 by FDA under an Emergency Use Authorization (EUA). This EUA will remain in effect (meaning this test can be used) for the duration of the COVID-19 declaration under Section 564(b)(1) of the Act, 21 U.S.C. section 360bbb-3(b)(1), unless the authorization is terminated or  revoked.     Resp Syncytial Virus by PCR NEGATIVE NEGATIVE Final    Comment: (NOTE) Fact Sheet for Patients: BloggerCourse.com  Fact Sheet for Healthcare Providers: SeriousBroker.it  This test is not yet approved or cleared by the [place]  FDA and has been authorized for detection and/or diagnosis of SARS-CoV-2 by FDA under an Emergency Use Authorization (EUA). This EUA will remain in effect (meaning this test can be used) for the duration of the COVID-19 declaration under Section 564(b)(1) of the Act, 21 U.S.C. section 360bbb-3(b)(1), unless the authorization is terminated or revoked.  Performed at [facility], [address] [place], [place] [address]   Respiratory (~20 pathogens) panel by PCR     Status: None   Collection Time: 10/05/23  8:11 AM   Specimen: Nasopharyngeal Swab; Respiratory  Result Value Ref Range Status   Adenovirus NOT DETECTED NOT DETECTED Final   Coronavirus 229E NOT DETECTED NOT DETECTED Final    Comment: (NOTE) The Coronavirus on the Respiratory Panel, DOES NOT test for the novel  Coronavirus (2019 nCoV)    Coronavirus HKU1 NOT DETECTED NOT DETECTED Final   Coronavirus NL63 NOT DETECTED NOT DETECTED Final   Coronavirus OC43 NOT DETECTED NOT DETECTED Final   Metapneumovirus NOT DETECTED NOT DETECTED Final    Rhinovirus / Enterovirus NOT DETECTED NOT DETECTED Final   Influenza A NOT DETECTED NOT DETECTED Final   Influenza B NOT DETECTED NOT DETECTED Final   Parainfluenza Virus 1 NOT DETECTED NOT DETECTED Final   Parainfluenza Virus 2 NOT DETECTED NOT DETECTED Final   Parainfluenza Virus 3 NOT DETECTED NOT DETECTED Final   Parainfluenza Virus 4 NOT DETECTED NOT DETECTED Final   Respiratory Syncytial Virus NOT DETECTED NOT DETECTED Final   Bordetella pertussis NOT DETECTED NOT DETECTED Final   Bordetella Parapertussis NOT DETECTED NOT DETECTED Final   Chlamydophila pneumoniae NOT DETECTED NOT DETECTED Final   Mycoplasma pneumoniae NOT DETECTED NOT DETECTED Final    Comment: Performed at [facility], [address] [place], [place] [address]  Expectorated Sputum Assessment w Gram Stain, Rflx to Resp Cult     Status: None   Collection Time: 10/05/23  9:07 AM   Specimen: Sputum  Result Value Ref Range Status   Specimen Description SPUTUM  Final   Special Requests NONE  Final   Sputum evaluation   Final    Sputum specimen not acceptable for testing.  Please recollect.   C/KERRY NELSON AT 1005 10/05/23.PMF Performed at [facility], [address] [place], [place] [address]    Report Status 10/05/2023 FINAL  Final  Expectorated Sputum Assessment w Gram Stain, Rflx to Resp Cult     Status: None   Collection Time: 10/05/23 10:50 AM  Result Value Ref Range Status   Specimen Description EXPECTORATED SPUTUM  Final   Special Requests NONE  Final   Sputum evaluation   Final    THIS SPECIMEN IS ACCEPTABLE FOR SPUTUM CULTURE Performed at [facility], [address], [place], [place] [address]    Report Status 10/05/2023 FINAL  Final  Culture, Respiratory w Gram Stain     Status: None   Collection Time: 10/05/23 10:50 AM  Result Value Ref Range Status   Specimen Description   Final    EXPECTORATED SPUTUM Performed at [facility], [address]  [place], [place] [address]    Special Requests   Final    NONE Reflexed from [id number] Performed at [facility],  [address] [place], [place] [address]    Gram Stain   Final    RARE WBC SEEN RARE GRAM POSITIVE RODS RARE GRAM POSITIVE COCCI RARE GRAM NEGATIVE RODS    Culture   Final    FEW Normal respiratory flora-no Staph aureus or Pseudomonas seen Performed at [facility], [address], [place], [place] [address]    Report Status 10/07/2023 FINAL  Final  MRSA Next Gen by PCR, Nasal     Status: None   Collection Time: 10/06/23 12:57 AM   Specimen: Nasal Mucosa; Nasal Swab  Result Value Ref Range Status   MRSA by PCR Next Gen NOT DETECTED NOT DETECTED Final    Comment: (NOTE) The GeneXpert MRSA Assay (FDA approved for NASAL specimens only), is one component of a comprehensive MRSA colonization surveillance program. It is not intended to diagnose MRSA infection nor to guide or monitor treatment for MRSA infections. Test performance is not FDA approved in patients less than 75 years old. Performed at [facility], [address] [place], [place] [address]   Culture, Respiratory w Gram Stain     Status: None   Collection Time: 10/06/23 11:37 AM   Specimen: INDUCED SPUTUM  Result Value Ref Range Status   Specimen Description   Final    INDUCED SPUTUM Performed at [facility], [address], [place], [place] [address]    Special Requests   Final    NONE Performed at [facility], [address] [place], [place] [address]    Gram Stain   Final    FEW WBC PRESENT,BOTH PMN AND MONONUCLEAR FEW GRAM POSITIVE RODS    Culture   Final    MODERATE LACTOBACILLUS FERMENTUM Standardized susceptibility testing for this organism is not available. Performed at [facility], [address] [place], [place] [address]    Report Status 10/09/2023 FINAL  Final  Culture, blood (x 2)     Status: None   Collection Time: 10/22/23  1:00  AM   Specimen: BLOOD  Result Value Ref Range Status   Specimen Description BLOOD BLOOD RIGHT ARM  Final   Special Requests   Final    BOTTLES DRAWN AEROBIC AND ANAEROBIC Blood Culture adequate volume   Culture   Final    NO GROWTH 5 DAYS Performed at [facility], [address] [place], [place] [address]    Report Status 10/27/2023 FINAL  Final  Culture, blood (x 2)     Status: None   Collection Time: 10/22/23  1:00 AM   Specimen: BLOOD  Result Value Ref Range Status   Specimen Description BLOOD BLOOD RIGHT ARM  Final   Special Requests   Final    BOTTLES DRAWN AEROBIC AND ANAEROBIC Blood Culture adequate volume   Culture   Final    NO GROWTH 5 DAYS Performed at [facility], [address] [place], [place] [address]    Report Status 10/27/2023 FINAL  Final  Resp panel by RT-PCR (RSV, Flu A&B, Covid) Anterior Nasal Swab     Status: None   Collection Time: 11/09/23  5:31 PM   Specimen: Anterior Nasal Swab  Result Value Ref Range Status   SARS Coronavirus 2 by RT PCR NEGATIVE NEGATIVE Final    Comment: (NOTE) SARS-CoV-2 target nucleic acids are NOT DETECTED.  The SARS-CoV-2 RNA is generally detectable in upper respiratory specimens during the acute phase of infection. The lowest concentration of SARS-CoV-2 viral copies  this assay can detect is 138 copies/mL. A negative result does not preclude SARS-Cov-2 infection and should not be used as the sole basis for treatment or other patient management decisions. A negative result may occur with  improper specimen collection/handling, submission of specimen other than nasopharyngeal swab, presence of viral mutation(s) within the areas targeted by this assay, and inadequate number of viral copies(<138 copies/mL). A negative result must be combined with clinical observations, patient history, and epidemiological information. The expected result is Negative.  Fact Sheet for Patients:   BloggerCourse.com  Fact Sheet for Healthcare Providers:  SeriousBroker.it  This test is no t yet approved or cleared by the [place]  FDA and  has been authorized for detection and/or diagnosis of SARS-CoV-2 by FDA under an Emergency Use Authorization (EUA). This EUA will remain  in effect (meaning this test can be used) for the duration of the COVID-19 declaration under Section 564(b)(1) of the Act, 21 U.S.C.section 360bbb-3(b)(1), unless the authorization is terminated  or revoked sooner.       Influenza A by PCR NEGATIVE NEGATIVE Final   Influenza B by PCR NEGATIVE NEGATIVE Final    Comment: (NOTE) The Xpert Xpress SARS-CoV-2/FLU/RSV plus assay is intended as an aid in the diagnosis of influenza from Nasopharyngeal swab specimens and should not be used as a sole basis for treatment. Nasal washings and aspirates are unacceptable for Xpert Xpress SARS-CoV-2/FLU/RSV testing.  Fact Sheet for Patients: BloggerCourse.com  Fact Sheet for Healthcare Providers: SeriousBroker.it  This test is not yet approved or cleared by the [place]  FDA and has been authorized for detection and/or diagnosis of SARS-CoV-2 by FDA under an Emergency Use Authorization (EUA). This EUA will remain in effect (meaning this test can be used) for the duration of the COVID-19 declaration under Section 564(b)(1) of the Act, 21 U.S.C. section 360bbb-3(b)(1), unless the authorization is terminated or revoked.     Resp Syncytial Virus by PCR NEGATIVE NEGATIVE Final    Comment: (NOTE) Fact Sheet for Patients: BloggerCourse.com  Fact Sheet for Healthcare Providers: SeriousBroker.it  This test is not yet approved or cleared by the [place]  FDA and has been authorized for detection and/or diagnosis of SARS-CoV-2 by FDA under an Emergency Use  Authorization (EUA). This EUA will remain in effect (meaning this test can be used) for the duration of the COVID-19 declaration under Section 564(b)(1) of the Act, 21 U.S.C. section 360bbb-3(b)(1), unless the authorization is terminated or revoked.  Performed at [facility], [address] [place], [place] [address]   Respiratory (~20 pathogens) panel by PCR     Status: None   Collection Time: 11/09/23  5:31 PM   Specimen: Nasopharyngeal Swab; Respiratory  Result Value Ref Range Status   Adenovirus NOT DETECTED NOT DETECTED Final   Coronavirus 229E NOT DETECTED NOT DETECTED Final    Comment: (NOTE) The Coronavirus on the Respiratory Panel, DOES NOT test for the novel  Coronavirus (2019 nCoV)    Coronavirus HKU1 NOT DETECTED NOT DETECTED Final   Coronavirus NL63 NOT DETECTED NOT DETECTED Final   Coronavirus OC43 NOT DETECTED NOT DETECTED Final   Metapneumovirus NOT DETECTED NOT DETECTED Final   Rhinovirus / Enterovirus NOT DETECTED NOT DETECTED Final   Influenza A NOT DETECTED NOT DETECTED Final   Influenza B NOT DETECTED NOT DETECTED Final   Parainfluenza Virus 1 NOT DETECTED NOT DETECTED Final   Parainfluenza Virus 2 NOT DETECTED NOT DETECTED Final   Parainfluenza Virus 3 NOT DETECTED NOT DETECTED Final  Parainfluenza Virus 4 NOT DETECTED NOT DETECTED Final   Respiratory Syncytial Virus NOT DETECTED NOT DETECTED Final   Bordetella pertussis NOT DETECTED NOT DETECTED Final   Bordetella Parapertussis NOT DETECTED NOT DETECTED Final   Chlamydophila pneumoniae NOT DETECTED NOT DETECTED Final   Mycoplasma pneumoniae NOT DETECTED NOT DETECTED Final    Comment: Performed at [facility], [address], [place], [place] [address]  Culture, blood (Routine X 2) w Reflex to ID Panel     Status: None (Preliminary result)   Collection Time: 11/09/23  6:16 PM   Specimen: BLOOD  Result Value Ref Range Status   Specimen Description BLOOD RIGHT ANTECUBITAL  Final    Special Requests   Final    BOTTLES DRAWN AEROBIC ONLY Blood Culture adequate volume   Culture   Final    NO GROWTH 4 DAYS Performed at [facility], [address], [place], [place] [address]    Report Status PENDING  Incomplete  Culture, blood (Routine X 2) w Reflex to ID Panel     Status: None (Preliminary result)   Collection Time: 11/09/23  6:23 PM   Specimen: BLOOD  Result Value Ref Range Status   Specimen Description BLOOD BLOOD LEFT FOREARM  Final   Special Requests   Final    BOTTLES DRAWN AEROBIC AND ANAEROBIC Blood Culture adequate volume   Culture   Final    NO GROWTH 4 DAYS Performed at [facility], [address] [place], [place] [address]    Report Status PENDING  Incomplete  Urine Culture     Status: Abnormal   Collection Time: 11/10/23  1:58 AM   Specimen: Urine, Random  Result Value Ref Range Status   Specimen Description   Final    URINE, RANDOM Performed at [facility], [address], [place], [place] [address]    Special Requests   Final    NONE Reflexed from [id number] Performed at [facility], [address] [place], [place] [address]    Culture 60,000 COLONIES/mL ENTEROCOCCUS FAECALIS (A)  Final   Report Status 11/12/2023 FINAL  Final   Organism ID, Bacteria ENTEROCOCCUS FAECALIS (A)  Final      Susceptibility   Enterococcus faecalis - MIC*    AMPICILLIN  <=2 SENSITIVE Sensitive     NITROFURANTOIN <=16 SENSITIVE Sensitive     VANCOMYCIN 1 SENSITIVE Sensitive     * 60,000 COLONIES/mL ENTEROCOCCUS FAECALIS    Coagulation Studies: No results for input(s): LABPROT, INR in the last 72 hours.  Urinalysis: No results for input(s): COLORURINE, LABSPEC, PHURINE, GLUCOSEU, HGBUR, BILIRUBINUR, KETONESUR, PROTEINUR, UROBILINOGEN, NITRITE, LEUKOCYTESUR in the last 72 hours.  Invalid input(s): APPERANCEUR    Imaging: No results found.   Medications:    feeding supplement (OSMOLITE  1.5 CAL) 1,000 mL (11/15/23 2222)    sodium chloride    Intravenous Once   Chlorhexidine  Gluconate Cloth  6 each Topical Daily   Chlorhexidine  Gluconate Cloth  6 each Topical Q0600   [START ON 11/18/2023] epoetin  alfa-epbx (RETACRIT ) injection  4,000 Units Intravenous Q M,W,F-HD   free water   30 mL Per Tube Q4H   gentamicin  ointment   Topical TID   heparin  injection (subcutaneous)  5,000 Units Subcutaneous Q8H   insulin  aspart  10 Units Subcutaneous Once   multivitamin  1 tablet Per Tube QHS   pantoprazole  (PROTONIX ) IV  40 mg Intravenous Q12H   acetaminophen  **OR** acetaminophen , artificial tears, bisacodyl , ipratropium-albuterol , [DISCONTINUED] ondansetron  **OR** ondansetron  (ZOFRAN ) IV,  mouth rinse  Assessment/ Plan:  Mr. Robert Vega is a 83 y.o.  male with obstructive sleep apnea, GERD, obesity, hypertension, dysphagia, severe esophageal dysmotility with achalasia status post PEG tube placement, recent aspiration pneumonia acute respiratory failure status post extubation 10/09/2023, who was admitted to [facility] on 10/05/2023 for Aspiration into respiratory tract, initial encounter [T17.908A] Severe sepsis (HCC) [A41.9, R65.20] History of dysphagia [Z87.898] Fever, unspecified fever cause [R50.9] Multifocal pneumonia [J18.9]   1.  Acute kidney injury.  Baseline creatinine 0.81 from 10/14/2023.  Acute kidney injury secondary to ATN, obstructive uropathy, and progression of prerenal azotemia.   Patient is critically ill.  He was started on urgent hemodialysis for severe hyperkalemia and uremia. -Plan for short-term dialysis to see if clearing of uremia and restoration of electrolyte balance helps improved clinical condition. - It does not appear that dialysis has made a significant difference in his underlying mental status. Plan for next treatment on Monday. May consider permanent permcath placement next week to continue dialysis.   2.  Severe hyperkalemia-potassium acceptable at 4.6.   3.   Originally admitted for sepsis due to aspiration pneumonia, found to have distal esophageal obstruction with severe dysmotility.  He is status post botulinum toxin injection to the lower esophageal sphincter.  PEG tube placed this admission.     4.Anemia in the setting of renal failure Recent Labs       Lab Results  Component Value Date    HGB 7.9 (L) 11/15/2023    Continue Epogen  4000's IV with dialysis.     5.  Bilateral hydronephrosis Noted on CT.Urology team has evaluated the patient and it is thought to be secondary to reflux.  No intervention at this time due to fragile health.     LOS: 42 [address] Yandow P Laurabelle Gorczyca 6/21/202510:27 AM

## 2023-11-16 NOTE — Progress Notes (Signed)
 PROGRESS NOTE Robert Vega    DOB: 12/14/1940, 83 y.o.  FMW:[id number]    Code Status: Full Code   DOA: 10/05/2023   LOS: 42  Brief hospital course  HURSCHEL PAYNTER is a 83 y.o male with significant PMH of OSA, GERD, Obesity, HTN, Dysphagia - presented to the ED 10/05/2023 from with hypoxia, fever and generalized weakness. Dx sepsis/pneumonia, concern for aspiration    Of note, EGD 03/2022 with food in upper esophagus complicated by aspiration event, cardiac arrest with round of CPR, and post resuscitation EGD with concern for lack of peristalsis. Hx prior EGD 08/2020 with note of abnormal cricopharyngeus, decrease in motility in esophagus, and spastic LES    5/10: Admit to [facility] service with sepsis due to Aspiration Pneumonia.  Course complicated by Acute Respiratory Failure due to Aspiration of vomitus w/ cardiac arrest  transfer to ICU and intubation.  5/11: flexible bronchoscopy  5/13: PEG tube, +vomiting last night, failed SAT/SBT 5/14: extubated but with severe delirium 5/20:  Botulinum toxin injection into the lower esophageal sphincter by Dr. Jinny 5/21: esophagram showing diffusely distended esophagus with distal obstruction and severe dysmotility suggestive of achalasia. At that point patient was stabilized to point of pursuing SNF placement but then with complications of PEG tube placement resulted in return to OR 5/27 and tube feeds restarted 5/28 with zosyn  for peritonitis.  Had new melena 6/9 so underwent another EGD without acute bleeding seen.  Renal function declined and nephrology was consulted and without improvement with conservative management, he received dialysis access and started on HD 6/16 as this was in line with family goals of care.  Discharge will now be pending arrangement of facility and outpatient HD if he does not have renal recovery.  He remains in poor prognosis state with intermittently needing to hold tube feeds again for intolerance and needed blood  transfusion 6/18 for anemia likely related more to the renal failure as no source of bleeding has been identified thus far. Mental status is oriented to self and able to follow simple commands. PT/OT recommends SNF.   Assessment & Plan  Goals of care- Son, [name] will be the primary and only point of contact and we will not discuss separately with granddaughter [name] unless she is on speaker phone or conference call w/ [name] present  - Re code status: [name] confirms that the patient would want CPR and life support if needed, even if unlikely to be successful.  AKI- secondary to ATN. now on HD. Had very little uop and irritation with foley so it was removed 6/19 - bladder scan as needed if suspecting any retention - continue HD per nephrology  - Monitor UOP - will need outpatient HD set up if in line to continue with family GOC.   Unasyn  was started for suspected UTI. Completed 6/20.  Decreased bowel sounds  Question ileus- resolved. Having regular Bms and seems to be tolerating tube feeds. - Monitor abdominal status frequently and reimage if concern arises.   Anemia 2/2 renal failure and questionable melena- hgb decreased to 6.7 in setting of renal failure without signs of acute bleeding. Received 1 unit pRBCs 6/18 and hgb is stable. Repeat EGD 06/10 no concerns, GI has s/o. HH has been stable. Hgb 7.9 today - CBC am. Maintain hgb goal >7    Achalasia  Dysphagia  Aspiration risk is high- Status post botulinum toxin injection on 5/20, several EGD and SLP evals. Not safe for PO intake unless family wants to  accept risk that he will 100% aspirate again and if that is the case, would recommend comfort care and then could liberalize diet for comfort.  - tube feeds as able, per RD - N.p.o. for the foreseeable future per GI - SLP to follow  - Will need outpatient referral to tertiary advanced GI for consideration of POEMS procedure.  Recommend this only after a few weeks recovery period.     Severe sepsis - resolved. Initially w/ aspiration pna. Later in hospital stay w/ intra-abdominal infection secondary to PEG dislodged Acute respiratory failure with hypoxia and hypercapnia  Pneumonia- resolved - remains on 2L [place]. 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  VTE ppx: heparin  injection 5,000 Units Start: 11/13/23 2200heparin  Diet:     Diet   Diet NPO time specified Except for: Ice Chips   Consultants: Nephrology  GI  Subjective 11/16/23    Pt reports no complaints. Denies pain.    Objective  Blood pressure 101/70, pulse 86, temperature 97.7 F (36.5 C), temperature source Oral, resp. rate 19, height 5' 9.02 (1.753 m), weight 102.1 kg, SpO2 95%.  Intake/Output Summary (Last 24 hours) at 11/16/2023 0727 Last data filed at 11/15/2023 1500 Gross per 24 hour  Intake 1413.83 ml  Output 1000 ml  Net 413.83 ml   Filed Weights   11/15/23 0835 11/15/23 1239 11/16/23 0401  Weight: 102.9 kg 101.9 kg 104.7 kg    Physical Exam:  General: awake, alert, NAD HEENT: atraumatic, clear conjunctiva, anicteric sclera, MMM, hearing grossly normal Respiratory: normal respiratory effort. Cardiovascular: quick capillary refill, normal S1/S2, RRR, no JVD, murmurs Gastrointestinal: soft, NT, ND Nervous: A&O xself. Moving all limbs spontaneously Extremities: moves all equally, no edema, normal tone Skin: dry, intact, normal temperature, normal color. No rashes, lesions or ulcers on exposed skin Psychiatry: calm and cooperative. Unable to follow commands.   Labs   I have  personally reviewed the following labs and imaging studies CBC    Component Value Date/Time   WBC 13.9 (H) 11/15/2023 0900   RBC 2.60 (L) 11/15/2023 0900   HGB 7.9 (L) 11/15/2023 0900   HCT 23.9 (L) 11/15/2023 0900   PLT 304 11/15/2023 0900   MCV 91.9 11/15/2023 0900   MCH 30.4 11/15/2023 0900   MCHC 33.1 11/15/2023 0900   RDW 14.6 11/15/2023 0900   LYMPHSABS 1.6 10/31/2023 0218   MONOABS 2.3 (H) 10/31/2023 0218   EOSABS 0.1 10/31/2023 0218   BASOSABS 0.1 10/31/2023 0218      Latest Ref Rng & Units 11/15/2023    9:00 AM 11/13/2023    4:23 AM 11/12/2023   11:05 PM  BMP  Glucose 70 - 99 mg/dL 868  877  896   BUN 8 - 23 mg/dL 69  69  62   Creatinine 0.61 - 1.24 mg/dL 3.07  4.73  5.05   Sodium 135 - 145 mmol/L 134  134  133   Potassium 3.5 - 5.1 mmol/L 4.6  4.8  4.8   Chloride 98 - 111 mmol/L 97  97  96   CO2 22 - 32 mmol/L 26  25  23   Calcium  8.9 - 10.3 mg/dL 6.8  7.4  7.2    No results found.   Disposition Plan & Communication  Patient status: Inpatient  Admitted From: Home Planned disposition location: SNF Anticipated discharge date: TBD pending renal function   Family Communication: son on phone   Author: Marien LITTIE Piety, DO [facility] 11/16/2023, 7:27 AM   Available by Epic secure chat 7AM-7PM. If 7PM-7AM, please contact night-coverage.  [facility] contact information found on [email or web address].

## 2023-11-17 DIAGNOSIS — A419 Sepsis, unspecified organism: Secondary | ICD-10-CM | POA: Diagnosis not present

## 2023-11-17 DIAGNOSIS — R6520 Severe sepsis without septic shock: Secondary | ICD-10-CM | POA: Diagnosis not present

## 2023-11-17 DIAGNOSIS — F0390 Unspecified dementia without behavioral disturbance: Secondary | ICD-10-CM | POA: Diagnosis not present

## 2023-11-17 DIAGNOSIS — N186 End stage renal disease: Secondary | ICD-10-CM | POA: Diagnosis not present

## 2023-11-17 LAB — GLUCOSE, CAPILLARY
Glucose-Capillary: 146 mg/dL — ABNORMAL HIGH (ref 70–99)
Glucose-Capillary: 140 mg/dL — ABNORMAL HIGH (ref 70–99)
Glucose-Capillary: 132 mg/dL — ABNORMAL HIGH (ref 70–99)
Glucose-Capillary: 143 mg/dL — ABNORMAL HIGH (ref 70–99)
Glucose-Capillary: 129 mg/dL — ABNORMAL HIGH (ref 70–99)
Glucose-Capillary: 132 mg/dL — ABNORMAL HIGH (ref 70–99)

## 2023-11-17 LAB — TROPONIN I (HIGH SENSITIVITY): Troponin I (High Sensitivity): 7 ng/L (ref <18)

## 2023-11-17 MED ORDER — CHLORHEXIDINE GLUCONATE CLOTH 2 % EX PADS
6.0000 | MEDICATED_PAD | Freq: Every day | CUTANEOUS | Status: DC
Start: 2023-11-18 — End: 2023-12-08
  Administered 2023-11-18 – 2023-12-08 (×21): 6 via TOPICAL

## 2023-11-17 NOTE — Progress Notes (Signed)
 [facility]  ROUNDING NOTE   Subjective:  Patient seen and evaluated at bedside. Patient alert today. Short responses to questions.No acute distress noted.    Objective:  Vital signs in last 24 hours:  Temp:  [98.8 F (37.1 C)-99 F (37.2 C)] 99 F (37.2 C) (06/22 0735) Pulse Rate:  [72-88] 72 (06/22 0735) Resp:  [18-24] 24 (06/22 0735) BP: (100-137)/(62-75) 100/75 (06/22 0735) SpO2:  [94 %-99 %] 94 % (06/22 0735)  Weight change:  Filed Weights   11/15/23 0835 11/15/23 1239 11/16/23 0401  Weight: 102.9 kg 101.9 kg 104.7 kg    Intake/Output: No intake/output data recorded.   Intake/Output this shift:  No intake/output data recorded.  Physical Exam: General: NAD,   Head: Normocephalic, atraumatic. Moist oral mucosal membranes  Eyes: Anicteric, PERRL  Neck: Supple, trachea midline  Lungs:  2L [place], Diminished with scattered wheezes to auscultation  Heart: Regular rate and rhythm  Abdomen:  Soft, nontender, PEG tube in place  Extremities:  None peripheral edema.  Neurologic: alert  Skin: intact  Access: Rt temp fem vascath    Basic Metabolic Panel: Recent Labs  Lab 11/11/23 1853 11/12/23 0357 11/12/23 0739 11/12/23 2305 11/13/23 0423 11/14/23 0406 11/15/23 0900  NA 134*  --  133* 133* 134*  --  134*  K 7.0*  --  5.9* 4.8 4.8  --  4.6  CL 100  --  96* 96* 97*  --  97*  CO2 19*  --  25 23 25   --  26  GLUCOSE 116*  --  104* 103* 122*  --  131*  BUN 115*  --  89* 62* 69*  --  69*  CREATININE 7.67*  --  6.21* 4.94* 5.26*  --  6.92*  CALCIUM  7.7*  --  7.4* 7.2* 7.4*  --  6.8*  MG  --  2.2  --   --  2.1 2.1  --   PHOS  --   --  7.1* 6.6*  --   --  7.6*    Liver Function Tests: Recent Labs  Lab 11/10/23 1544 11/12/23 0739 11/12/23 2305 11/13/23 0423 11/15/23 0900  AST 73*  --   --  65*  --   ALT 84*  --   --  76*  --   ALKPHOS 92  --   --  92  --   BILITOT 0.8  --   --  0.5  --   PROT 6.4*  --   --  6.3*  --   ALBUMIN 1.9* 1.7* 1.6* 1.7*  1.7*   No results for input(s): LIPASE, AMYLASE in the last 168 hours. No results for input(s): AMMONIA in the last 168 hours.  CBC: Recent Labs  Lab 11/11/23 0403 11/12/23 0357 11/13/23 0423 11/14/23 0406 11/15/23 0900  WBC 17.1* 16.1* 11.4* 12.0* 13.9*  HGB 8.0* 7.6* 6.7* 8.7* 7.9*  HCT 24.2* 23.4* 21.0* 26.1* 23.9*  MCV 91.3 91.8 92.1 92.2 91.9  PLT 374 333 309 289 304    Cardiac Enzymes: No results for input(s): CKTOTAL, CKMB, CKMBINDEX, TROPONINI in the last 168 hours.  BNP: Invalid input(s): POCBNP  CBG: Recent Labs  Lab 11/16/23 1540 11/16/23 2012 11/16/23 2342 11/17/23 0405 11/17/23 0734  GLUCAP 133* 137* 127* 146* 140*    Microbiology: Results for orders placed or performed during the hospital encounter of 10/05/23  Blood Culture (routine x 2)     Status: None   Collection Time: 10/05/23  5:06 AM  Specimen: BLOOD  Result Value Ref Range Status   Specimen Description BLOOD LA  Final   Special Requests   Final    BOTTLES DRAWN AEROBIC AND ANAEROBIC Blood Culture results may not be optimal due to an inadequate volume of blood received in culture bottles   Culture   Final    NO GROWTH 5 DAYS Performed at [facility], [address] [place], [place] [address]    Report Status 10/10/2023 FINAL  Final  Blood Culture (routine x 2)     Status: None   Collection Time: 10/05/23  5:07 AM   Specimen: BLOOD  Result Value Ref Range Status   Specimen Description BLOOD RA  Final   Special Requests   Final    BOTTLES DRAWN AEROBIC AND ANAEROBIC Blood Culture results may not be optimal due to an inadequate volume of blood received in culture bottles   Culture   Final    NO GROWTH 5 DAYS Performed at [facility], [address] [place], [place] [address]    Report Status 10/10/2023 FINAL  Final  Resp panel by RT-PCR (RSV, Flu A&B, Covid) Anterior Nasal Swab     Status: None   Collection Time: 10/05/23  5:56 AM   Specimen:  Anterior Nasal Swab  Result Value Ref Range Status   SARS Coronavirus 2 by RT PCR NEGATIVE NEGATIVE Final    Comment: (NOTE) SARS-CoV-2 target nucleic acids are NOT DETECTED.  The SARS-CoV-2 RNA is generally detectable in upper respiratory specimens during the acute phase of infection. The lowest concentration of SARS-CoV-2 viral copies this assay can detect is 138 copies/mL. A negative result does not preclude SARS-Cov-2 infection and should not be used as the sole basis for treatment or other patient management decisions. A negative result may occur with  improper specimen collection/handling, submission of specimen other than nasopharyngeal swab, presence of viral mutation(s) within the areas targeted by this assay, and inadequate number of viral copies(<138 copies/mL). A negative result must be combined with clinical observations, patient history, and epidemiological information. The expected result is Negative.  Fact Sheet for Patients:  BloggerCourse.com  Fact Sheet for Healthcare Providers:  SeriousBroker.it  This test is no t yet approved or cleared by the [place]  FDA and  has been authorized for detection and/or diagnosis of SARS-CoV-2 by FDA under an Emergency Use Authorization (EUA). This EUA will remain  in effect (meaning this test can be used) for the duration of the COVID-19 declaration under Section 564(b)(1) of the Act, 21 U.S.C.section 360bbb-3(b)(1), unless the authorization is terminated  or revoked sooner.       Influenza A by PCR NEGATIVE NEGATIVE Final   Influenza B by PCR NEGATIVE NEGATIVE Final    Comment: (NOTE) The Xpert Xpress SARS-CoV-2/FLU/RSV plus assay is intended as an aid in the diagnosis of influenza from Nasopharyngeal swab specimens and should not be used as a sole basis for treatment. Nasal washings and aspirates are unacceptable for Xpert Xpress SARS-CoV-2/FLU/RSV testing.  Fact  Sheet for Patients: BloggerCourse.com  Fact Sheet for Healthcare Providers: SeriousBroker.it  This test is not yet approved or cleared by the [place]  FDA and has been authorized for detection and/or diagnosis of SARS-CoV-2 by FDA under an Emergency Use Authorization (EUA). This EUA will remain in effect (meaning this test can be used) for the duration of the COVID-19 declaration under Section 564(b)(1) of the Act, 21 U.S.C. section 360bbb-3(b)(1), unless the authorization is terminated or revoked.  Resp Syncytial Virus by PCR NEGATIVE NEGATIVE Final    Comment: (NOTE) Fact Sheet for Patients: BloggerCourse.com  Fact Sheet for Healthcare Providers: SeriousBroker.it  This test is not yet approved or cleared by the [place]  FDA and has been authorized for detection and/or diagnosis of SARS-CoV-2 by FDA under an Emergency Use Authorization (EUA). This EUA will remain in effect (meaning this test can be used) for the duration of the COVID-19 declaration under Section 564(b)(1) of the Act, 21 U.S.C. section 360bbb-3(b)(1), unless the authorization is terminated or revoked.  Performed at [facility] [address] [place], [place] [address]   Respiratory (~20 pathogens) panel by PCR     Status: None   Collection Time: 10/05/23  8:11 AM   Specimen: Nasopharyngeal Swab; Respiratory  Result Value Ref Range Status   Adenovirus NOT DETECTED NOT DETECTED Final   Coronavirus 229E NOT DETECTED NOT DETECTED Final    Comment: (NOTE) The Coronavirus on the Respiratory Panel, DOES NOT test for the novel  Coronavirus (2019 nCoV)    Coronavirus HKU1 NOT DETECTED NOT DETECTED Final   Coronavirus NL63 NOT DETECTED NOT DETECTED Final   Coronavirus OC43 NOT DETECTED NOT DETECTED Final   Metapneumovirus NOT DETECTED NOT DETECTED Final   Rhinovirus / Enterovirus NOT  DETECTED NOT DETECTED Final   Influenza A NOT DETECTED NOT DETECTED Final   Influenza B NOT DETECTED NOT DETECTED Final   Parainfluenza Virus 1 NOT DETECTED NOT DETECTED Final   Parainfluenza Virus 2 NOT DETECTED NOT DETECTED Final   Parainfluenza Virus 3 NOT DETECTED NOT DETECTED Final   Parainfluenza Virus 4 NOT DETECTED NOT DETECTED Final   Respiratory Syncytial Virus NOT DETECTED NOT DETECTED Final   Bordetella pertussis NOT DETECTED NOT DETECTED Final   Bordetella Parapertussis NOT DETECTED NOT DETECTED Final   Chlamydophila pneumoniae NOT DETECTED NOT DETECTED Final   Mycoplasma pneumoniae NOT DETECTED NOT DETECTED Final    Comment: Performed at [facility], [address] [place], [place] [address]  Expectorated Sputum Assessment w Gram Stain, Rflx to Resp Cult     Status: None   Collection Time: 10/05/23  9:07 AM   Specimen: Sputum  Result Value Ref Range Status   Specimen Description SPUTUM  Final   Special Requests NONE  Final   Sputum evaluation   Final    Sputum specimen not acceptable for testing.  Please recollect.   C/KERRY NELSON AT 1005 10/05/23.PMF Performed at [facility], [address] [place], [place] [address]    Report Status 10/05/2023 FINAL  Final  Expectorated Sputum Assessment w Gram Stain, Rflx to Resp Cult     Status: None   Collection Time: 10/05/23 10:50 AM  Result Value Ref Range Status   Specimen Description EXPECTORATED SPUTUM  Final   Special Requests NONE  Final   Sputum evaluation   Final    THIS SPECIMEN IS ACCEPTABLE FOR SPUTUM CULTURE Performed at [facility], [address] [place], [place] [address]    Report Status 10/05/2023 FINAL  Final  Culture, Respiratory w Gram Stain     Status: None   Collection Time: 10/05/23 10:50 AM  Result Value Ref Range Status   Specimen Description   Final    EXPECTORATED SPUTUM Performed at [facility], [address] [place], [place] [address]    Special  Requests   Final    NONE Reflexed from [id number] Performed at [facility], [address] [place],  [place] [address]    Gram Stain   Final    RARE WBC SEEN RARE GRAM POSITIVE RODS RARE GRAM POSITIVE COCCI RARE GRAM NEGATIVE RODS    Culture   Final    FEW Normal respiratory flora-no Staph aureus or Pseudomonas seen Performed at [facility], [address] [place], [place] [address]    Report Status 10/07/2023 FINAL  Final  MRSA Next Gen by PCR, Nasal     Status: None   Collection Time: 10/06/23 12:57 AM   Specimen: Nasal Mucosa; Nasal Swab  Result Value Ref Range Status   MRSA by PCR Next Gen NOT DETECTED NOT DETECTED Final    Comment: (NOTE) The GeneXpert MRSA Assay (FDA approved for NASAL specimens only), is one component of a comprehensive MRSA colonization surveillance program. It is not intended to diagnose MRSA infection nor to guide or monitor treatment for MRSA infections. Test performance is not FDA approved in patients less than 49 years old. Performed at [facility], [address] [place], [place] [address]   Culture, Respiratory w Gram Stain     Status: None   Collection Time: 10/06/23 11:37 AM   Specimen: INDUCED SPUTUM  Result Value Ref Range Status   Specimen Description   Final    INDUCED SPUTUM Performed at [facility], [address], [place], [place] [address]    Special Requests   Final    NONE Performed at [facility] [address] [place], [place] [address]    Gram Stain   Final    FEW WBC PRESENT,BOTH PMN AND MONONUCLEAR FEW GRAM POSITIVE RODS    Culture   Final    MODERATE LACTOBACILLUS FERMENTUM Standardized susceptibility testing for this organism is not available. Performed at [facility], [address] [place], [place] [address]    Report Status 10/09/2023 FINAL  Final  Culture, blood (x 2)     Status: None   Collection Time: 10/22/23  1:00 AM   Specimen: BLOOD  Result  Value Ref Range Status   Specimen Description BLOOD BLOOD RIGHT ARM  Final   Special Requests   Final    BOTTLES DRAWN AEROBIC AND ANAEROBIC Blood Culture adequate volume   Culture   Final    NO GROWTH 5 DAYS Performed at [facility], [address], [place], [place] [address]    Report Status 10/27/2023 FINAL  Final  Culture, blood (x 2)     Status: None   Collection Time: 10/22/23  1:00 AM   Specimen: BLOOD  Result Value Ref Range Status   Specimen Description BLOOD BLOOD RIGHT ARM  Final   Special Requests   Final    BOTTLES DRAWN AEROBIC AND ANAEROBIC Blood Culture adequate volume   Culture   Final    NO GROWTH 5 DAYS Performed at [facility], [address] [place], [place] [address]    Report Status 10/27/2023 FINAL  Final  Resp panel by RT-PCR (RSV, Flu A&B, Covid) Anterior Nasal Swab     Status: None   Collection Time: 11/09/23  5:31 PM   Specimen: Anterior Nasal Swab  Result Value Ref Range Status   SARS Coronavirus 2 by RT PCR NEGATIVE NEGATIVE Final    Comment: (NOTE) SARS-CoV-2 target nucleic acids are NOT DETECTED.  The SARS-CoV-2 RNA is generally detectable in upper respiratory specimens during the acute phase of infection. The lowest concentration of SARS-CoV-2 viral copies this assay can detect is  138 copies/mL. A negative result does not preclude SARS-Cov-2 infection and should not be used as the sole basis for treatment or other patient management decisions. A negative result may occur with  improper specimen collection/handling, submission of specimen other than nasopharyngeal swab, presence of viral mutation(s) within the areas targeted by this assay, and inadequate number of viral copies(<138 copies/mL). A negative result must be combined with clinical observations, patient history, and epidemiological information. The expected result is Negative.  Fact Sheet for Patients:  BloggerCourse.com  Fact Sheet  for Healthcare Providers:  SeriousBroker.it  This test is no t yet approved or cleared by the [place]  FDA and  has been authorized for detection and/or diagnosis of SARS-CoV-2 by FDA under an Emergency Use Authorization (EUA). This EUA will remain  in effect (meaning this test can be used) for the duration of the COVID-19 declaration under Section 564(b)(1) of the Act, 21 U.S.C.section 360bbb-3(b)(1), unless the authorization is terminated  or revoked sooner.       Influenza A by PCR NEGATIVE NEGATIVE Final   Influenza B by PCR NEGATIVE NEGATIVE Final    Comment: (NOTE) The Xpert Xpress SARS-CoV-2/FLU/RSV plus assay is intended as an aid in the diagnosis of influenza from Nasopharyngeal swab specimens and should not be used as a sole basis for treatment. Nasal washings and aspirates are unacceptable for Xpert Xpress SARS-CoV-2/FLU/RSV testing.  Fact Sheet for Patients: BloggerCourse.com  Fact Sheet for Healthcare Providers: SeriousBroker.it  This test is not yet approved or cleared by the [place]  FDA and has been authorized for detection and/or diagnosis of SARS-CoV-2 by FDA under an Emergency Use Authorization (EUA). This EUA will remain in effect (meaning this test can be used) for the duration of the COVID-19 declaration under Section 564(b)(1) of the Act, 21 U.S.C. section 360bbb-3(b)(1), unless the authorization is terminated or revoked.     Resp Syncytial Virus by PCR NEGATIVE NEGATIVE Final    Comment: (NOTE) Fact Sheet for Patients: BloggerCourse.com  Fact Sheet for Healthcare Providers: SeriousBroker.it  This test is not yet approved or cleared by the [place]  FDA and has been authorized for detection and/or diagnosis of SARS-CoV-2 by FDA under an Emergency Use Authorization (EUA). This EUA will remain in effect (meaning  this test can be used) for the duration of the COVID-19 declaration under Section 564(b)(1) of the Act, 21 U.S.C. section 360bbb-3(b)(1), unless the authorization is terminated or revoked.  Performed at [facility], [address] [place], [place] [address]   Respiratory (~20 pathogens) panel by PCR     Status: None   Collection Time: 11/09/23  5:31 PM   Specimen: Nasopharyngeal Swab; Respiratory  Result Value Ref Range Status   Adenovirus NOT DETECTED NOT DETECTED Final   Coronavirus 229E NOT DETECTED NOT DETECTED Final    Comment: (NOTE) The Coronavirus on the Respiratory Panel, DOES NOT test for the novel  Coronavirus (2019 nCoV)    Coronavirus HKU1 NOT DETECTED NOT DETECTED Final   Coronavirus NL63 NOT DETECTED NOT DETECTED Final   Coronavirus OC43 NOT DETECTED NOT DETECTED Final   Metapneumovirus NOT DETECTED NOT DETECTED Final   Rhinovirus / Enterovirus NOT DETECTED NOT DETECTED Final   Influenza A NOT DETECTED NOT DETECTED Final   Influenza B NOT DETECTED NOT DETECTED Final   Parainfluenza Virus 1 NOT DETECTED NOT DETECTED Final   Parainfluenza Virus 2 NOT DETECTED NOT DETECTED Final   Parainfluenza Virus 3 NOT DETECTED NOT DETECTED Final   Parainfluenza Virus 4  NOT DETECTED NOT DETECTED Final   Respiratory Syncytial Virus NOT DETECTED NOT DETECTED Final   Bordetella pertussis NOT DETECTED NOT DETECTED Final   Bordetella Parapertussis NOT DETECTED NOT DETECTED Final   Chlamydophila pneumoniae NOT DETECTED NOT DETECTED Final   Mycoplasma pneumoniae NOT DETECTED NOT DETECTED Final    Comment: Performed at [facility], [address], [place], [place] [address]  Culture, blood (Routine X 2) w Reflex to ID Panel     Status: None (Preliminary result)   Collection Time: 11/09/23  6:16 PM   Specimen: BLOOD  Result Value Ref Range Status   Specimen Description BLOOD RIGHT ANTECUBITAL  Final   Special Requests   Final    BOTTLES DRAWN AEROBIC ONLY Blood  Culture adequate volume   Culture   Final    NO GROWTH 4 DAYS Performed at [facility], [address], [place], [place] [address]    Report Status PENDING  Incomplete  Culture, blood (Routine X 2) w Reflex to ID Panel     Status: None (Preliminary result)   Collection Time: 11/09/23  6:23 PM   Specimen: BLOOD  Result Value Ref Range Status   Specimen Description BLOOD BLOOD LEFT FOREARM  Final   Special Requests   Final    BOTTLES DRAWN AEROBIC AND ANAEROBIC Blood Culture adequate volume   Culture   Final    NO GROWTH 4 DAYS Performed at [facility], [address] [place], [place] [address]    Report Status PENDING  Incomplete  Urine Culture     Status: Abnormal   Collection Time: 11/10/23  1:58 AM   Specimen: Urine, Random  Result Value Ref Range Status   Specimen Description   Final    URINE, RANDOM Performed at [facility], [address], [place], [place] [address]    Special Requests   Final    NONE Reflexed from [id number] Performed at [facility], [address] [place], [place] [address]    Culture 60,000 COLONIES/mL ENTEROCOCCUS FAECALIS (A)  Final   Report Status 11/12/2023 FINAL  Final   Organism ID, Bacteria ENTEROCOCCUS FAECALIS (A)  Final      Susceptibility   Enterococcus faecalis - MIC*    AMPICILLIN  <=2 SENSITIVE Sensitive     NITROFURANTOIN <=16 SENSITIVE Sensitive     VANCOMYCIN 1 SENSITIVE Sensitive     * 60,000 COLONIES/mL ENTEROCOCCUS FAECALIS    Coagulation Studies: No results for input(s): LABPROT, INR in the last 72 hours.  Urinalysis: No results for input(s): COLORURINE, LABSPEC, PHURINE, GLUCOSEU, HGBUR, BILIRUBINUR, KETONESUR, PROTEINUR, UROBILINOGEN, NITRITE, LEUKOCYTESUR in the last 72 hours.  Invalid input(s): APPERANCEUR    Imaging: DG Abd 1 View Result Date: 11/16/2023 CLINICAL DATA:  Abdominal distension. EXAM: ABDOMEN - 1 VIEW portable supine COMPARISON:   X-ray 11/09/2023. FINDINGS: Right femoral catheter in place. Tip seen to the right of the spine at L5. Films are under penetration and there is motion. In addition the top of the diaphragm is clipped off the edge of the film as is the right lateral hemi abdominal edge. Gas is seen in nondilated loops of colon. Minimal small bowel gas. Overlapping presumed G-tube. Presumed vascular calcifications in the pelvis. Degenerative changes of the spine and pelvis. IMPRESSION: Limited x-rays. Nonspecific bowel gas pattern. Minimal small bowel gas. G-tube.  Right femoral double-lumen large-bore catheter. Electronically Signed   By: Ranell Bring M.D.   On: 11/16/2023 11:15     Medications:  feeding supplement (OSMOLITE 1.5 CAL) 1,000 mL (11/15/23 2222)    sodium chloride    Intravenous Once   [START ON 11/18/2023] Chlorhexidine  Gluconate Cloth  6 each Topical Q0600   [START ON 11/18/2023] epoetin  alfa-epbx (RETACRIT ) injection  4,000 Units Intravenous Q M,W,F-HD   free water   30 mL Per Tube Q4H   gentamicin  ointment   Topical TID   heparin  injection (subcutaneous)  5,000 Units Subcutaneous Q8H   insulin  aspart  10 Units Subcutaneous Once   multivitamin  1 tablet Per Tube QHS   pantoprazole  (PROTONIX ) IV  40 mg Intravenous Q12H   acetaminophen  **OR** acetaminophen , artificial tears, bisacodyl , ipratropium-albuterol , [DISCONTINUED] ondansetron  **OR** ondansetron  (ZOFRAN ) IV, mouth rinse  Assessment/ Plan:  Mr. Robert Vega is a 83 y.o.  male  with obstructive sleep apnea, GERD, obesity, hypertension, dysphagia, severe esophageal dysmotility with achalasia status post PEG tube placement, recent aspiration pneumonia acute respiratory failure status post extubation 10/09/2023, who was admitted to [facility] on 10/05/2023 for Aspiration into respiratory tract, initial encounter [T17.908A] Severe sepsis (HCC) [A41.9, R65.20] History of dysphagia [Z87.898] Fever, unspecified fever cause [R50.9] Multifocal pneumonia  [J18.9]   1.  Acute kidney injury.  Baseline creatinine 0.81 from 10/14/2023.  Acute kidney injury secondary to ATN, obstructive uropathy, and progression of prerenal azotemia.   Patient is critically ill.  He was started on urgent hemodialysis for severe hyperkalemia and uremia. -Plan for short-term dialysis to see if clearing of uremia and restoration of electrolyte balance helps improved clinical condition. - It does not appear that dialysis has made a significant difference in his underlying mental status. Plan for next treatment on Monday. Will reach out to vascular for permanent permcath placement next week to continue dialysis.   2.  Severe hyperkalemia-potassium acceptable at 4.6.   3.  Originally admitted for sepsis due to aspiration pneumonia, found to have distal esophageal obstruction with severe dysmotility.  He is status post botulinum toxin injection to the lower esophageal sphincter.  PEG tube placed this admission.     4.Anemia in the setting of renal failure Recent Labs           Lab Results  Component Value Date    HGB 7.9 (L) 11/15/2023    Continue Epogen  4000's IV with dialysis.     5.  Bilateral hydronephrosis Noted on CT.Urology team has evaluated the patient and it is thought to be secondary to reflux.  No intervention at this time due to fragile health.   LOS: 43 Arieana Somoza P Ronnae Kaser 6/22/20259:53 AM

## 2023-11-17 NOTE — Progress Notes (Signed)
 PROGRESS NOTE Robert Vega    DOB: Oct 15, 1940, 83 y.o.  FMW:[id number]    Code Status: Full Code   DOA: 10/05/2023   LOS: 43  Brief hospital course  Robert Vega is a 83 y.o male with significant PMH of OSA, GERD, Obesity, HTN, Dysphagia - presented to the ED 10/05/2023 from with hypoxia, fever and generalized weakness. Dx sepsis/pneumonia, concern for aspiration    Of note, EGD 03/2022 with food in upper esophagus complicated by aspiration event, cardiac arrest with round of CPR, and post resuscitation EGD with concern for lack of peristalsis. Hx prior EGD 08/2020 with note of abnormal cricopharyngeus, decrease in motility in esophagus, and spastic LES    5/10: Admit to [facility] service with sepsis due to Aspiration Pneumonia.  Course complicated by Acute Respiratory Failure due to Aspiration of vomitus w/ cardiac arrest  transfer to ICU and intubation.  5/11: flexible bronchoscopy  5/13: PEG tube, +vomiting last night, failed SAT/SBT 5/14: extubated but with severe delirium 5/20:  Botulinum toxin injection into the lower esophageal sphincter by Dr. Jinny 5/21: esophagram showing diffusely distended esophagus with distal obstruction and severe dysmotility suggestive of achalasia. At that point patient was stabilized to point of pursuing SNF placement but then with complications of PEG tube placement resulted in return to OR 5/27 and tube feeds restarted 5/28 with zosyn  for peritonitis.  Had new melena 6/9 so underwent another EGD without acute bleeding seen.  Renal function declined and nephrology was consulted and without improvement with conservative management, he received dialysis access and started on HD 6/16 as this was in line with family goals of care.  Discharge will now be pending arrangement of facility and outpatient HD if he does not have renal recovery.  He remains in poor prognosis state with intermittently needing to hold tube feeds again for intolerance and needed blood  transfusion 6/18 for anemia likely related more to the renal failure as no source of bleeding has been identified thus far. Mental status is oriented to self and location and able to follow simple commands but otherwise confused. PT/OT recommends SNF.   Assessment & Plan  Goals of care- Son, Robert Vega will be the primary and only point of contact and we will not discuss separately with granddaughter [name] unless she is on speaker phone or conference call w/ Robert Vega present  - Re code status: Robert Vega confirms that the patient would want CPR and life support if needed, even if unlikely to be successful. - SNF at dc with outpatient HD as it does not appear that he is having renal recovery at this time  AKI- secondary to ATN. now on HD. Had very little uop and irritation with foley so it was removed 6/19 - bladder scan as needed if suspecting any retention - continue HD per nephrology  - Monitor UOP - will need outpatient HD set up if in line to continue with family GOC.   Unasyn  was started for suspected UTI. Completed 6/20.  Decreased bowel sounds  Question ileus- resolved. Having regular Bms and seems to be tolerating tube feeds. - Monitor abdominal status frequently and reimage if concern arises.   Anemia 2/2 renal failure and questionable melena- hgb decreased to 6.7 in setting of renal failure without signs of acute bleeding. Received 1 unit pRBCs 6/18 and hgb is stable. Repeat EGD 06/10 no concerns, GI has s/o. HH has been stable. Hgb 7.9 - CBC am. Maintain hgb goal >7    Achalasia  Dysphagia  Aspiration risk is high- Status post botulinum toxin injection on 5/20, several EGD and SLP evals. Not safe for PO intake unless family wants to accept risk that he will 100% aspirate again and if that is the case, would recommend comfort care and then could liberalize diet for comfort.  - tube feeds as able, per RD - N.p.o. for the foreseeable future per GI - SLP to follow  - Will need outpatient  referral to tertiary advanced GI for consideration of POEMS procedure.  Recommend this only after a few weeks recovery period.    Severe sepsis - resolved. Initially w/ aspiration pna. Later in hospital stay w/ intra-abdominal infection secondary to PEG dislodged Acute respiratory failure with hypoxia and hypercapnia  Pneumonia- resolved - remains on 2L [place]. 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  VTE ppx: heparin  injection 5,000 Units Start: 11/13/23 2200heparin  Diet:     Diet   Diet NPO time specified Except for: Ice Chips   Consultants: Nephrology  GI Vascular General surgery Palliative care CCM  Subjective 11/17/23    Pt reports no complaints. Denies pain.Talks about wanting to go home.    Objective  Blood pressure 101/70, pulse 86, temperature 97.7 F (36.5 C), temperature source Oral, resp. rate 19, height 5' 9.02 (1.753 m), weight 102.1 kg, SpO2 95%.  Intake/Output Summary (Last 24 hours) at 11/17/2023 0745 Last data filed at 11/16/2023 0800 Gross per 24 hour  Intake --  Output 0 ml  Net 0 ml   Filed Weights   11/15/23 0835 11/15/23 1239 11/16/23 0401  Weight: 102.9 kg 101.9 kg 104.7 kg    Physical Exam:  General: awake, alert, NAD HEENT: atraumatic, clear conjunctiva, anicteric sclera, MMM, hearing grossly normal Respiratory: normal respiratory effort. Cardiovascular: quick capillary refill, normal S1/S2, RRR, no JVD, murmurs Gastrointestinal: soft, NT, ND Nervous: A&O xself and that he's in the hospital. Moving all limbs  spontaneously Extremities: moves all equally, no edema, normal tone Skin: dry, intact, normal temperature, normal color. No rashes, lesions or ulcers on exposed skin Psychiatry: calm and cooperative. Unable to follow commands.   Labs   I have personally reviewed the following labs and imaging studies CBC    Component Value Date/Time   WBC 13.9 (H) 11/15/2023 0900   RBC 2.60 (L) 11/15/2023 0900   HGB 7.9 (L) 11/15/2023 0900   HCT 23.9 (L) 11/15/2023 0900   PLT 304 11/15/2023 0900   MCV 91.9 11/15/2023 0900   MCH 30.4 11/15/2023 0900   MCHC 33.1 11/15/2023 0900   RDW 14.6 11/15/2023 0900   LYMPHSABS 1.6 10/31/2023 0218   MONOABS 2.3 (H) 10/31/2023 0218   EOSABS 0.1 10/31/2023 0218   BASOSABS 0.1 10/31/2023 0218      Latest Ref Rng & Units 11/15/2023    9:00 AM 11/13/2023    4:23 AM 11/12/2023   11:05 PM  BMP  Glucose 70 - 99 mg/dL 868  877  896   BUN 8 - 23 mg/dL 69  69  62   Creatinine 0.61 - 1.24 mg/dL 3.07  4.73  5.05   Sodium  135 - 145 mmol/L 134  134  133   Potassium 3.5 - 5.1 mmol/L 4.6  4.8  4.8   Chloride 98 - 111 mmol/L 97  97  96   CO2 22 - 32 mmol/L 26  25  23    Calcium  8.9 - 10.3 mg/dL 6.8  7.4  7.2    DG Abd 1 View Result Date: 11/16/2023 CLINICAL DATA:  Abdominal distension. EXAM: ABDOMEN - 1 VIEW portable supine COMPARISON:  X-ray 11/09/2023. FINDINGS: Right femoral catheter in place. Tip seen to the right of the spine at L5. Films are under penetration and there is motion. In addition the top of the diaphragm is clipped off the edge of the film as is the right lateral hemi abdominal edge. Gas is seen in nondilated loops of colon. Minimal small bowel gas. Overlapping presumed G-tube. Presumed vascular calcifications in the pelvis. Degenerative changes of the spine and pelvis. IMPRESSION: Limited x-rays. Nonspecific bowel gas pattern. Minimal small bowel gas. G-tube.  Right femoral double-lumen large-bore catheter. Electronically Signed   By: Ranell Bring M.D.   On:  11/16/2023 11:15     Disposition Plan & Communication  Patient status: Inpatient  Admitted From: Home Planned disposition location: SNF Anticipated discharge date: TBD pending renal function   Family Communication: son on phone   Author: Marien LITTIE Piety, DO [facility] 11/17/2023, 7:45 AM   Available by Epic secure chat 7AM-7PM. If 7PM-7AM, please contact night-coverage.  [facility] contact information found on [email or web address].

## 2023-11-18 DIAGNOSIS — A419 Sepsis, unspecified organism: Secondary | ICD-10-CM | POA: Diagnosis not present

## 2023-11-18 DIAGNOSIS — R6520 Severe sepsis without septic shock: Secondary | ICD-10-CM | POA: Diagnosis not present

## 2023-11-18 DIAGNOSIS — F0390 Unspecified dementia without behavioral disturbance: Secondary | ICD-10-CM | POA: Diagnosis not present

## 2023-11-18 DIAGNOSIS — N186 End stage renal disease: Secondary | ICD-10-CM | POA: Diagnosis not present

## 2023-11-18 LAB — RENAL FUNCTION PANEL
Albumin: 1.6 g/dL — ABNORMAL LOW (ref 3.5–5.0)
Anion gap: 11 (ref 5–15)
BUN: 77 mg/dL — ABNORMAL HIGH (ref 8–23)
CO2: 26 mmol/L (ref 22–32)
Calcium: 7 mg/dL — ABNORMAL LOW (ref 8.9–10.3)
Chloride: 97 mmol/L — ABNORMAL LOW (ref 98–111)
Creatinine, Ser: 8.39 mg/dL — ABNORMAL HIGH (ref 0.61–1.24)
GFR, Estimated: 6 mL/min — ABNORMAL LOW (ref >60)
Glucose, Bld: 138 mg/dL — ABNORMAL HIGH (ref 70–99)
Phosphorus: 8.4 mg/dL — ABNORMAL HIGH (ref 2.5–4.6)
Potassium: 6.3 mmol/L (ref 3.5–5.1)
Sodium: 134 mmol/L — ABNORMAL LOW (ref 135–145)

## 2023-11-18 LAB — CBC
HCT: 24.9 % — ABNORMAL LOW (ref 39.0–52.0)
Hemoglobin: 8.1 g/dL — ABNORMAL LOW (ref 13.0–17.0)
MCH: 30.3 pg (ref 26.0–34.0)
MCHC: 32.5 g/dL (ref 30.0–36.0)
MCV: 93.3 fL (ref 80.0–100.0)
Platelets: 386 10*3/uL (ref 150–400)
RBC: 2.67 MIL/uL — ABNORMAL LOW (ref 4.22–5.81)
RDW: 14.9 % (ref 11.5–15.5)
WBC: 18.5 10*3/uL — ABNORMAL HIGH (ref 4.0–10.5)
nRBC: 0 % (ref 0.0–0.2)

## 2023-11-18 LAB — GLUCOSE, CAPILLARY
Glucose-Capillary: 145 mg/dL — ABNORMAL HIGH (ref 70–99)
Glucose-Capillary: 147 mg/dL — ABNORMAL HIGH (ref 70–99)
Glucose-Capillary: 138 mg/dL — ABNORMAL HIGH (ref 70–99)
Glucose-Capillary: 134 mg/dL — ABNORMAL HIGH (ref 70–99)

## 2023-11-18 MED ORDER — EPOETIN ALFA-EPBX 4000 UNIT/ML IJ SOLN
INTRAMUSCULAR | Status: AC
  Filled 2023-11-18: qty 1

## 2023-11-18 NOTE — Progress Notes (Signed)
 Dr. Lenon at bedside and is aware of potassium of 6.3 prior to dialysis. MD acknowledged and stated she would order a potassium level for later.

## 2023-11-18 NOTE — TOC Progression Note (Signed)
 Transition of Care [facility]) - Progression Note    Patient Details  Name: Robert Vega MRN: [id number] Date of Birth: Mar 03, 1941  Transition of Care [facility]) CM/SW Contact  Lauraine [name], LCSW Phone Number: 11/18/2023, 4:42 PM  Clinical Narrative:  CSW notified HD coordinators that [employer] only transports to [facility] on [address].   Expected Discharge Plan and Services                                               Social Determinants of Health (SDOH) Interventions SDOH Screenings   Food Insecurity: Patient Unable To Answer (10/06/2023)  Recent Concern: Food Insecurity - Food Insecurity Present (09/11/2023)   Received from [facility]  Housing: Patient Unable To Answer (10/06/2023)  Recent Concern: Housing - High Risk (09/11/2023)   Received from [facility]  Transportation Needs: Patient Unable To Answer (10/06/2023)  Utilities: Patient Unable To Answer (10/06/2023)  Depression (PHQ2-9): Low Risk  (05/07/2023)  Financial Resource Strain: Medium Risk (09/11/2023)   Received from [facility]  Social Connections: Unknown (10/06/2023)  Tobacco Use: Medium Risk (11/05/2023)    Readmission Risk Interventions     No data to display

## 2023-11-18 NOTE — Progress Notes (Signed)
 OT Cancellation Note  Patient Details Name: Robert Vega MRN: [id number] DOB: [birth year]   Cancelled Treatment:    Reason Eval/Treat Not Completed: Patient at procedure or test/ unavailable. Pt out of his room. Per chart, at dialysis. Will re-attempt OT tx at later date/time as pt is available and medically appropriate.   Tyann Niehaus R., MPH, MS, OTR/L ascom [phone] 11/18/23, 8:26 AM

## 2023-11-18 NOTE — Progress Notes (Signed)
 PT Cancellation Note  Patient Details Name: Robert Vega MRN: [id number] DOB: December 02, 1940   Cancelled Treatment:    Reason Eval/Treat Not Completed: Patient at procedure or test/unavailable.  Pt currently off unit at dialysis.  Will re-attempt PT session at a later date/time.  Damien Caulk, PT 11/18/23, 11:23 AM

## 2023-11-18 NOTE — Progress Notes (Addendum)
 Hemodialysis Note:  Received patient in bed to unit. Oriented to person Informed consent singed and in chart.  Treatment initiated: 0816 Treatment completed: 1211  Access used: Right Femoral catheter Access issues: None  Patient tolerated well. Transported back to room, alert without acute distress. Report given to patient's RN.  Total UF removed: 2 liters Medications given: Retacrit  4000 units IV  Post HD weight: 100.1 Kg  [facility]

## 2023-11-18 NOTE — Progress Notes (Signed)
 PROGRESS NOTE GERRELL TABET    DOB: 08-12-1940, 83 y.o.  FMW:[id number]    Code Status: Full Code   DOA: 10/05/2023   LOS: 44  Brief hospital course  RAJIV PARLATO is a 83 y.o male with significant PMH of OSA, GERD, Obesity, HTN, Dysphagia - presented to the ED 10/05/2023 from with hypoxia, fever and generalized weakness. Dx sepsis/pneumonia, concern for aspiration    Of note, EGD 03/2022 with food in upper esophagus complicated by aspiration event, cardiac arrest with round of CPR, and post resuscitation EGD with concern for lack of peristalsis. Hx prior EGD 08/2020 with note of abnormal cricopharyngeus, decrease in motility in esophagus, and spastic LES    5/10: Admit to [facility] service with sepsis due to Aspiration Pneumonia.  Course complicated by Acute Respiratory Failure due to Aspiration of vomitus w/ cardiac arrest  transfer to ICU and intubation.  5/11: flexible bronchoscopy  5/13: PEG tube, +vomiting last night, failed SAT/SBT 5/14: extubated but with severe delirium 5/20:  Botulinum toxin injection into the lower esophageal sphincter by Dr. Jinny 5/21: esophagram showing diffusely distended esophagus with distal obstruction and severe dysmotility suggestive of achalasia. At that point patient was stabilized to point of pursuing SNF placement but then with complications of PEG tube placement resulted in return to OR 5/27 and tube feeds restarted 5/28 with zosyn  for peritonitis.  Had new melena 6/9 so underwent another EGD without acute bleeding seen.  Renal function declined and nephrology was consulted and without improvement with conservative management, he received dialysis access and started on HD 6/16 as this was in line with family goals of care.  Discharge will now be pending arrangement of facility and outpatient HD if he does not have renal recovery.  He remains in poor prognosis state with intermittently needing to hold tube feeds again for intolerance and needed blood  transfusion 6/18 for anemia likely related more to the renal failure as no source of bleeding has been identified thus far. Mental status is oriented to self and location and able to follow simple commands but otherwise confused. PT/OT recommends SNF.   Assessment & Plan  Goals of care- Son, [name] will be the primary and only point of contact and we will not discuss separately with granddaughter [name] unless she is on speaker phone or conference call w/ [name] present  - Re code status: [name] confirms that the patient would want CPR and life support if needed, even if unlikely to be successful. - SNF at dc with outpatient HD as it does not appear that he is having renal recovery at this time  AKI  Acute Renal Failure- secondary to ATN. now on HD. Had very little uop and irritation with foley so it was removed 6/19 Hyperkalemia- persistent. Addressed with HD today.  - bladder scan as needed if suspecting any retention - continue HD per nephrology  - Monitor UOP - will need outpatient HD set up if in line to continue with family GOC.  - patient is planned to be discharged to [facility] and would expect to be medically ready but the time OP HD is arranged.  - BMP am  Unasyn  was started for suspected UTI. Completed 6/20.  Decreased bowel sounds  Question ileus- resolved. Having regular Bms and seems to be tolerating tube feeds. - Monitor abdominal status frequently and reimage if concern arises.   Anemia 2/2 renal failure and questionable melena- hgb decreased to 6.7 in setting of renal failure without signs of  acute bleeding. Received 1 unit pRBCs 6/18 and hgb is stable. Repeat EGD 06/10 no concerns, GI has s/o. HH has been stable. Hgb 7.9>8.1 - CBC am. Maintain hgb goal >7    Achalasia  Dysphagia  Aspiration risk is high- Status post botulinum toxin injection on 5/20, several EGD and SLP evals. Not safe for PO intake unless family wants to accept risk that he will 100% aspirate again  and if that is the case, would recommend comfort care and then could liberalize diet for comfort.  - tube feeds as able, per RD - N.p.o. for the foreseeable future per GI - SLP to follow  - Will need outpatient referral to tertiary advanced GI for consideration of POEMS procedure.  Recommend this only after a few weeks recovery period.    Severe sepsis - resolved. Initially w/ aspiration pna. Later in hospital stay w/ intra-abdominal infection secondary to PEG dislodged Acute respiratory failure with hypoxia and hypercapnia  Pneumonia- resolved - remains on 2L [place]. 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 Reversible causes addressed and had significant improvement with initiating HD but still not back to previous state. I suspect he may have had anoxic brain injury during his critical illness in ICU requiring intubation and pressors. There was no signs of acute injury on head CT 5/16. Did show chronic microvascular ischemic disease.  Granddaughter has asked to discontinue prn antipsychotics   GERD (gastroesophageal reflux disease) Continue PPI    Essential hypertension, benign Bp wnl off meds Monitor VS     Generalized weakness - PT/OT ordered  Body mass index is 34.2 kg/m.  VTE ppx: heparin  injection 5,000 Units Start: 11/13/23 2200heparin  Diet:     Diet   Diet NPO time specified Except for: Ice Chips   Consultants: Nephrology  GI Vascular General surgery Palliative care CCM  Subjective 11/18/23    Pt reports no complaints. Denies pain. Oriented to self and location.    Objective  Blood pressure 101/70, pulse 86, temperature 97.7 F (36.5 C), temperature source Oral, resp. rate 19, height 5' 9.02 (1.753 m), weight 102.1 kg, SpO2 95%.  Intake/Output Summary (Last 24 hours) at 11/18/2023 0734 Last data filed  at 11/18/2023 9390 Gross per 24 hour  Intake 3473 ml  Output --  Net 3473 ml   Filed Weights   11/15/23 1239 11/16/23 0401 11/18/23 0418  Weight: 101.9 kg 104.7 kg 105.1 kg    Physical Exam:  General: awake, alert, NAD HEENT: atraumatic, clear conjunctiva, anicteric sclera, MMM, hearing grossly normal Respiratory: normal respiratory effort. Cardiovascular: quick capillary refill, normal S1/S2, RRR, no JVD, murmurs Gastrointestinal: soft, NT, ND Nervous: A&O xself and that he's in the hospital. Moving all limbs spontaneously Extremities: moves all equally, trace edema, normal tone Skin: dry, intact, normal temperature, normal color. No rashes, lesions or ulcers on exposed skin Psychiatry: calm and cooperative. Unable to follow commands.   Labs   I have personally reviewed the following labs and imaging studies CBC    Component Value Date/Time   WBC 13.9 (H) 11/15/2023 0900   RBC 2.60 (L) 11/15/2023 0900   HGB 7.9 (L) 11/15/2023 0900   HCT 23.9 (L) 11/15/2023 0900   PLT 304 11/15/2023 0900   MCV 91.9 11/15/2023 0900   MCH 30.4 11/15/2023 0900   MCHC 33.1 11/15/2023 0900   RDW 14.6 11/15/2023  0900   LYMPHSABS 1.6 10/31/2023 0218   MONOABS 2.3 (H) 10/31/2023 0218   EOSABS 0.1 10/31/2023 0218   BASOSABS 0.1 10/31/2023 0218      Latest Ref Rng & Units 11/15/2023    9:00 AM 11/13/2023    4:23 AM 11/12/2023   11:05 PM  BMP  Glucose 70 - 99 mg/dL 868  877  896   BUN 8 - 23 mg/dL 69  69  62   Creatinine 0.61 - 1.24 mg/dL 3.07  4.73  5.05   Sodium 135 - 145 mmol/L 134  134  133   Potassium 3.5 - 5.1 mmol/L 4.6  4.8  4.8   Chloride 98 - 111 mmol/L 97  97  96   CO2 22 - 32 mmol/L 26  25  23    Calcium  8.9 - 10.3 mg/dL 6.8  7.4  7.2    DG Abd 1 View Result Date: 11/16/2023 CLINICAL DATA:  Abdominal distension. EXAM: ABDOMEN - 1 VIEW portable supine COMPARISON:  X-ray 11/09/2023. FINDINGS: Right femoral catheter in place. Tip seen to the right of the spine at L5. Films are under  penetration and there is motion. In addition the top of the diaphragm is clipped off the edge of the film as is the right lateral hemi abdominal edge. Gas is seen in nondilated loops of colon. Minimal small bowel gas. Overlapping presumed G-tube. Presumed vascular calcifications in the pelvis. Degenerative changes of the spine and pelvis. IMPRESSION: Limited x-rays. Nonspecific bowel gas pattern. Minimal small bowel gas. G-tube.  Right femoral double-lumen large-bore catheter. Electronically Signed   By: Ranell Bring M.D.   On: 11/16/2023 11:15     Disposition Plan & Communication  Patient status: Inpatient  Admitted From: Home Planned disposition location: SNF Anticipated discharge date: TBD pending renal function   Family Communication: son on phone   Author: Marien LITTIE Piety, DO [facility] 11/18/2023, 7:34 AM   Available by Epic secure chat 7AM-7PM. If 7PM-7AM, please contact night-coverage.  [facility] contact information found on [email or web address].

## 2023-11-18 NOTE — Progress Notes (Signed)
 [facility]  ROUNDING NOTE   Subjective:   Patient seen and evaluated during hemodialysis treatment today. Tolerating treatment well. Currently arousable.   Objective:  Vital signs in last 24 hours:  Temp:  [98.2 F (36.8 C)-99.1 F (37.3 C)] 98.2 F (36.8 C) (06/23 0758) Pulse Rate:  [79-93] 93 (06/23 0930) Resp:  [19-33] 28 (06/23 0930) BP: (106-145)/(54-77) 128/63 (06/23 0930) SpO2:  [93 %-100 %] 95 % (06/23 0930) Weight:  [102.1 kg-105.1 kg] 102.1 kg (06/23 0758)  Weight change:  Filed Weights   11/16/23 0401 11/18/23 0418 11/18/23 0758  Weight: 104.7 kg 105.1 kg 102.1 kg    Intake/Output: I/O last 3 completed shifts: In: 3473 [NG/GT:[id number]] Out: -    Intake/Output this shift:  No intake/output data recorded.  Physical Exam: General: Chronically ill-appearing  Head: Oral mucosa moist  Eyes: Anicteric  Lungs:  Scattered rhonchi  Heart: Regular rate and rhythm  Abdomen:  Peg tube in place   Extremities: Trace peripheral edema.  Neurologic: Lethargic but arousable  Skin: No lesions  Access: Femoral dialysis catheter  Foley catheter in place  Basic Metabolic Panel: Recent Labs  Lab 11/12/23 0357 11/12/23 0739 11/12/23 2305 11/13/23 0423 11/14/23 0406 11/15/23 0900 11/18/23 0800  NA  --  133* 133* 134*  --  134* 134*  K  --  5.9* 4.8 4.8  --  4.6 6.3*  CL  --  96* 96* 97*  --  97* 97*  CO2  --  25 23 25   --  26 26  GLUCOSE  --  104* 103* 122*  --  131* 138*  BUN  --  89* 62* 69*  --  69* 77*  CREATININE  --  6.21* 4.94* 5.26*  --  6.92* 8.39*  CALCIUM   --  7.4* 7.2* 7.4*  --  6.8* 7.0*  MG 2.2  --   --  2.1 2.1  --   --   PHOS  --  7.1* 6.6*  --   --  7.6* 8.4*    Liver Function Tests: Recent Labs  Lab 11/12/23 0739 11/12/23 2305 11/13/23 0423 11/15/23 0900 11/18/23 0800  AST  --   --  65*  --   --   ALT  --   --  76*  --   --   ALKPHOS  --   --  92  --   --   BILITOT  --   --  0.5  --   --   PROT  --   --  6.3*  --   --    ALBUMIN 1.7* 1.6* 1.7* 1.7* 1.6*   No results for input(s): LIPASE, AMYLASE in the last 168 hours. No results for input(s): AMMONIA in the last 168 hours.  CBC: Recent Labs  Lab 11/12/23 0357 11/13/23 0423 11/14/23 0406 11/15/23 0900 11/18/23 0800  WBC 16.1* 11.4* 12.0* 13.9* 18.5*  HGB 7.6* 6.7* 8.7* 7.9* 8.1*  HCT 23.4* 21.0* 26.1* 23.9* 24.9*  MCV 91.8 92.1 92.2 91.9 93.3  PLT 333 309 289 304 386    Cardiac Enzymes: No results for input(s): CKTOTAL, CKMB, CKMBINDEX, TROPONINI in the last 168 hours.  BNP: Invalid input(s): POCBNP  CBG: Recent Labs  Lab 11/17/23 1120 11/17/23 1619 11/17/23 2039 11/17/23 2323 11/18/23 0649  GLUCAP 132* 143* 129* 132* 145*    Microbiology: Results for orders placed or performed during the hospital encounter of 10/05/23  Blood Culture (routine x 2)     Status: None  Collection Time: 10/05/23  5:06 AM   Specimen: BLOOD  Result Value Ref Range Status   Specimen Description BLOOD LA  Final   Special Requests   Final    BOTTLES DRAWN AEROBIC AND ANAEROBIC Blood Culture results may not be optimal due to an inadequate volume of blood received in culture bottles   Culture   Final    NO GROWTH 5 DAYS Performed at [facility], [address] [place], [place] [address]    Report Status 10/10/2023 FINAL  Final  Blood Culture (routine x 2)     Status: None   Collection Time: 10/05/23  5:07 AM   Specimen: BLOOD  Result Value Ref Range Status   Specimen Description BLOOD RA  Final   Special Requests   Final    BOTTLES DRAWN AEROBIC AND ANAEROBIC Blood Culture results may not be optimal due to an inadequate volume of blood received in culture bottles   Culture   Final    NO GROWTH 5 DAYS Performed at [facility], [address] [place], [place] [address]    Report Status 10/10/2023 FINAL  Final  Resp panel by RT-PCR (RSV, Flu A&B, Covid) Anterior Nasal Swab     Status: None   Collection Time:  10/05/23  5:56 AM   Specimen: Anterior Nasal Swab  Result Value Ref Range Status   SARS Coronavirus 2 by RT PCR NEGATIVE NEGATIVE Final    Comment: (NOTE) SARS-CoV-2 target nucleic acids are NOT DETECTED.  The SARS-CoV-2 RNA is generally detectable in upper respiratory specimens during the acute phase of infection. The lowest concentration of SARS-CoV-2 viral copies this assay can detect is 138 copies/mL. A negative result does not preclude SARS-Cov-2 infection and should not be used as the sole basis for treatment or other patient management decisions. A negative result may occur with  improper specimen collection/handling, submission of specimen other than nasopharyngeal swab, presence of viral mutation(s) within the areas targeted by this assay, and inadequate number of viral copies(<138 copies/mL). A negative result must be combined with clinical observations, patient history, and epidemiological information. The expected result is Negative.  Fact Sheet for Patients:  BloggerCourse.com  Fact Sheet for Healthcare Providers:  SeriousBroker.it  This test is no t yet approved or cleared by the [place]  FDA and  has been authorized for detection and/or diagnosis of SARS-CoV-2 by FDA under an Emergency Use Authorization (EUA). This EUA will remain  in effect (meaning this test can be used) for the duration of the COVID-19 declaration under Section 564(b)(1) of the Act, 21 U.S.C.section 360bbb-3(b)(1), unless the authorization is terminated  or revoked sooner.       Influenza A by PCR NEGATIVE NEGATIVE Final   Influenza B by PCR NEGATIVE NEGATIVE Final    Comment: (NOTE) The Xpert Xpress SARS-CoV-2/FLU/RSV plus assay is intended as an aid in the diagnosis of influenza from Nasopharyngeal swab specimens and should not be used as a sole basis for treatment. Nasal washings and aspirates are unacceptable for Xpert Xpress  SARS-CoV-2/FLU/RSV testing.  Fact Sheet for Patients: BloggerCourse.com  Fact Sheet for Healthcare Providers: SeriousBroker.it  This test is not yet approved or cleared by the [place]  FDA and has been authorized for detection and/or diagnosis of SARS-CoV-2 by FDA under an Emergency Use Authorization (EUA). This EUA will remain in effect (meaning this test can be used) for the duration of the COVID-19 declaration under Section 564(b)(1) of the Act, 21 U.S.C. section 360bbb-3(b)(1), unless the  authorization is terminated or revoked.     Resp Syncytial Virus by PCR NEGATIVE NEGATIVE Final    Comment: (NOTE) Fact Sheet for Patients: BloggerCourse.com  Fact Sheet for Healthcare Providers: SeriousBroker.it  This test is not yet approved or cleared by the [place]  FDA and has been authorized for detection and/or diagnosis of SARS-CoV-2 by FDA under an Emergency Use Authorization (EUA). This EUA will remain in effect (meaning this test can be used) for the duration of the COVID-19 declaration under Section 564(b)(1) of the Act, 21 U.S.C. section 360bbb-3(b)(1), unless the authorization is terminated or revoked.  Performed at [facility], [address] [place], [place] [address]   Respiratory (~20 pathogens) panel by PCR     Status: None   Collection Time: 10/05/23  8:11 AM   Specimen: Nasopharyngeal Swab; Respiratory  Result Value Ref Range Status   Adenovirus NOT DETECTED NOT DETECTED Final   Coronavirus 229E NOT DETECTED NOT DETECTED Final    Comment: (NOTE) The Coronavirus on the Respiratory Panel, DOES NOT test for the novel  Coronavirus (2019 nCoV)    Coronavirus HKU1 NOT DETECTED NOT DETECTED Final   Coronavirus NL63 NOT DETECTED NOT DETECTED Final   Coronavirus OC43 NOT DETECTED NOT DETECTED Final   Metapneumovirus NOT DETECTED NOT DETECTED Final    Rhinovirus / Enterovirus NOT DETECTED NOT DETECTED Final   Influenza A NOT DETECTED NOT DETECTED Final   Influenza B NOT DETECTED NOT DETECTED Final   Parainfluenza Virus 1 NOT DETECTED NOT DETECTED Final   Parainfluenza Virus 2 NOT DETECTED NOT DETECTED Final   Parainfluenza Virus 3 NOT DETECTED NOT DETECTED Final   Parainfluenza Virus 4 NOT DETECTED NOT DETECTED Final   Respiratory Syncytial Virus NOT DETECTED NOT DETECTED Final   Bordetella pertussis NOT DETECTED NOT DETECTED Final   Bordetella Parapertussis NOT DETECTED NOT DETECTED Final   Chlamydophila pneumoniae NOT DETECTED NOT DETECTED Final   Mycoplasma pneumoniae NOT DETECTED NOT DETECTED Final    Comment: Performed at [facility], [address], [place], [place] [address]  Expectorated Sputum Assessment w Gram Stain, Rflx to Resp Cult     Status: None   Collection Time: 10/05/23  9:07 AM   Specimen: Sputum  Result Value Ref Range Status   Specimen Description SPUTUM  Final   Special Requests NONE  Final   Sputum evaluation   Final    Sputum specimen not acceptable for testing.  Please recollect.   C/KERRY NELSON AT 1005 10/05/23.PMF Performed at [facility], [address] [place], [place] [address]    Report Status 10/05/2023 FINAL  Final  Expectorated Sputum Assessment w Gram Stain, Rflx to Resp Cult     Status: None   Collection Time: 10/05/23 10:50 AM  Result Value Ref Range Status   Specimen Description EXPECTORATED SPUTUM  Final   Special Requests NONE  Final   Sputum evaluation   Final    THIS SPECIMEN IS ACCEPTABLE FOR SPUTUM CULTURE Performed at [facility], [address] [place], [place] [address]    Report Status 10/05/2023 FINAL  Final  Culture, Respiratory w Gram Stain     Status: None   Collection Time: 10/05/23 10:50 AM  Result Value Ref Range Status   Specimen Description   Final    EXPECTORATED SPUTUM Performed at [facility], [address],  [place], [place] [address]    Special Requests   Final    NONE Reflexed from [id number] Performed  at [facility], [address] [place], [place] [address]    Gram Stain   Final    RARE WBC SEEN RARE VONNE POSITIVE RODS RARE VONNE POSITIVE COCCI RARE GRAM NEGATIVE RODS    Culture   Final    FEW Normal respiratory flora-no Staph aureus or Pseudomonas seen Performed at [facility], [address], [place], [place] [address]    Report Status 10/07/2023 FINAL  Final  MRSA Next Gen by PCR, Nasal     Status: None   Collection Time: 10/06/23 12:57 AM   Specimen: Nasal Mucosa; Nasal Swab  Result Value Ref Range Status   MRSA by PCR Next Gen NOT DETECTED NOT DETECTED Final    Comment: (NOTE) The GeneXpert MRSA Assay (FDA approved for NASAL specimens only), is one component of a comprehensive MRSA colonization surveillance program. It is not intended to diagnose MRSA infection nor to guide or monitor treatment for MRSA infections. Test performance is not FDA approved in patients less than 21 years old. Performed at [facility] [place], [address] [place], [place] [address]   Culture, Respiratory w Gram Stain     Status: None   Collection Time: 10/06/23 11:37 AM   Specimen: INDUCED SPUTUM  Result Value Ref Range Status   Specimen Description   Final    INDUCED SPUTUM Performed at [facility], [address], [place], [place] [address]    Special Requests   Final    NONE Performed at [facility], [address] [place], [place] [address]    Gram Stain   Final    FEW WBC PRESENT,BOTH PMN AND MONONUCLEAR FEW GRAM POSITIVE RODS    Culture   Final    MODERATE LACTOBACILLUS FERMENTUM Standardized susceptibility testing for this organism is not available. Performed at [facility], [address], [place], [place] [address]    Report Status 10/09/2023 FINAL  Final  Culture, blood (x 2)     Status: None   Collection Time: 10/22/23  1:00  AM   Specimen: BLOOD  Result Value Ref Range Status   Specimen Description BLOOD BLOOD RIGHT ARM  Final   Special Requests   Final    BOTTLES DRAWN AEROBIC AND ANAEROBIC Blood Culture adequate volume   Culture   Final    NO GROWTH 5 DAYS Performed at [facility], [address] [place], [place] [address]    Report Status 10/27/2023 FINAL  Final  Culture, blood (x 2)     Status: None   Collection Time: 10/22/23  1:00 AM   Specimen: BLOOD  Result Value Ref Range Status   Specimen Description BLOOD BLOOD RIGHT ARM  Final   Special Requests   Final    BOTTLES DRAWN AEROBIC AND ANAEROBIC Blood Culture adequate volume   Culture   Final    NO GROWTH 5 DAYS Performed at [facility], [address] [place] [address] [place], [place] [address]    Report Status 10/27/2023 FINAL  Final  Resp panel by RT-PCR (RSV, Flu A&B, Covid) Anterior Nasal Swab     Status: None   Collection Time: 11/09/23  5:31 PM   Specimen: Anterior Nasal Swab  Result Value Ref Range Status   SARS Coronavirus 2 by RT PCR NEGATIVE NEGATIVE Final    Comment: (NOTE) SARS-CoV-2 target nucleic acids are NOT DETECTED.  The SARS-CoV-2 RNA is generally detectable in upper respiratory specimens during the acute phase of infection. The lowest concentration  of SARS-CoV-2 viral copies this assay can detect is 138 copies/mL. A negative result does not preclude SARS-Cov-2 infection and should not be used as the sole basis for treatment or other patient management decisions. A negative result may occur with  improper specimen collection/handling, submission of specimen other than nasopharyngeal swab, presence of viral mutation(s) within the areas targeted by this assay, and inadequate number of viral copies(<138 copies/mL). A negative result must be combined with clinical observations, patient history, and epidemiological information. The expected result is Negative.  Fact Sheet for Patients:   BloggerCourse.com  Fact Sheet for Healthcare Providers:  SeriousBroker.it  This test is no t yet approved or cleared by the [place]  FDA and  has been authorized for detection and/or diagnosis of SARS-CoV-2 by FDA under an Emergency Use Authorization (EUA). This EUA will remain  in effect (meaning this test can be used) for the duration of the COVID-19 declaration under Section 564(b)(1) of the Act, 21 U.S.C.section 360bbb-3(b)(1), unless the authorization is terminated  or revoked sooner.       Influenza A by PCR NEGATIVE NEGATIVE Final   Influenza B by PCR NEGATIVE NEGATIVE Final    Comment: (NOTE) The Xpert Xpress SARS-CoV-2/FLU/RSV plus assay is intended as an aid in the diagnosis of influenza from Nasopharyngeal swab specimens and should not be used as a sole basis for treatment. Nasal washings and aspirates are unacceptable for Xpert Xpress SARS-CoV-2/FLU/RSV testing.  Fact Sheet for Patients: BloggerCourse.com  Fact Sheet for Healthcare Providers: SeriousBroker.it  This test is not yet approved or cleared by the [place]  FDA and has been authorized for detection and/or diagnosis of SARS-CoV-2 by FDA under an Emergency Use Authorization (EUA). This EUA will remain in effect (meaning this test can be used) for the duration of the COVID-19 declaration under Section 564(b)(1) of the Act, 21 U.S.C. section 360bbb-3(b)(1), unless the authorization is terminated or revoked.     Resp Syncytial Virus by PCR NEGATIVE NEGATIVE Final    Comment: (NOTE) Fact Sheet for Patients: BloggerCourse.com  Fact Sheet for Healthcare Providers: SeriousBroker.it  This test is not yet approved or cleared by the [place]  FDA and has been authorized for detection and/or diagnosis of SARS-CoV-2 by FDA under an Emergency Use  Authorization (EUA). This EUA will remain in effect (meaning this test can be used) for the duration of the COVID-19 declaration under Section 564(b)(1) of the Act, 21 U.S.C. section 360bbb-3(b)(1), unless the authorization is terminated or revoked.  Performed at [facility], [address] [place], [place] [address]   Respiratory (~20 pathogens) panel by PCR     Status: None   Collection Time: 11/09/23  5:31 PM   Specimen: Nasopharyngeal Swab; Respiratory  Result Value Ref Range Status   Adenovirus NOT DETECTED NOT DETECTED Final   Coronavirus 229E NOT DETECTED NOT DETECTED Final    Comment: (NOTE) The Coronavirus on the Respiratory Panel, DOES NOT test for the novel  Coronavirus (2019 nCoV)    Coronavirus HKU1 NOT DETECTED NOT DETECTED Final   Coronavirus NL63 NOT DETECTED NOT DETECTED Final   Coronavirus OC43 NOT DETECTED NOT DETECTED Final   Metapneumovirus NOT DETECTED NOT DETECTED Final   Rhinovirus / Enterovirus NOT DETECTED NOT DETECTED Final   Influenza A NOT DETECTED NOT DETECTED Final   Influenza B NOT DETECTED NOT DETECTED Final   Parainfluenza Virus 1 NOT DETECTED NOT DETECTED Final   Parainfluenza Virus 2 NOT DETECTED NOT DETECTED Final   Parainfluenza Virus 3 NOT  DETECTED NOT DETECTED Final   Parainfluenza Virus 4 NOT DETECTED NOT DETECTED Final   Respiratory Syncytial Virus NOT DETECTED NOT DETECTED Final   Bordetella pertussis NOT DETECTED NOT DETECTED Final   Bordetella Parapertussis NOT DETECTED NOT DETECTED Final   Chlamydophila pneumoniae NOT DETECTED NOT DETECTED Final   Mycoplasma pneumoniae NOT DETECTED NOT DETECTED Final    Comment: Performed at [facility], [address], [place], [place] [address]  Culture, blood (Routine X 2) w Reflex to ID Panel     Status: None (Preliminary result)   Collection Time: 11/09/23  6:16 PM   Specimen: BLOOD  Result Value Ref Range Status   Specimen Description BLOOD RIGHT ANTECUBITAL  Final    Special Requests   Final    BOTTLES DRAWN AEROBIC ONLY Blood Culture adequate volume   Culture   Final    NO GROWTH 4 DAYS Performed at [facility], [address] [place], [place] [address]    Report Status PENDING  Incomplete  Culture, blood (Routine X 2) w Reflex to ID Panel     Status: None (Preliminary result)   Collection Time: 11/09/23  6:23 PM   Specimen: BLOOD  Result Value Ref Range Status   Specimen Description BLOOD BLOOD LEFT FOREARM  Final   Special Requests   Final    BOTTLES DRAWN AEROBIC AND ANAEROBIC Blood Culture adequate volume   Culture   Final    NO GROWTH 4 DAYS Performed at [facility], [address] [place], [place] [address]    Report Status PENDING  Incomplete  Urine Culture     Status: Abnormal   Collection Time: 11/10/23  1:58 AM   Specimen: Urine, Random  Result Value Ref Range Status   Specimen Description   Final    URINE, RANDOM Performed at [facility], [address], [place], [place] [address]    Special Requests   Final    NONE Reflexed from [id number] Performed at [facility], [address] [place], [place] [address]    Culture 60,000 COLONIES/mL ENTEROCOCCUS FAECALIS (A)  Final   Report Status 11/12/2023 FINAL  Final   Organism ID, Bacteria ENTEROCOCCUS FAECALIS (A)  Final      Susceptibility   Enterococcus faecalis - MIC*    AMPICILLIN  <=2 SENSITIVE Sensitive     NITROFURANTOIN <=16 SENSITIVE Sensitive     VANCOMYCIN 1 SENSITIVE Sensitive     * 60,000 COLONIES/mL ENTEROCOCCUS FAECALIS    Coagulation Studies: No results for input(s): LABPROT, INR in the last 72 hours.  Urinalysis: No results for input(s): COLORURINE, LABSPEC, PHURINE, GLUCOSEU, HGBUR, BILIRUBINUR, KETONESUR, PROTEINUR, UROBILINOGEN, NITRITE, LEUKOCYTESUR in the last 72 hours.  Invalid input(s): APPERANCEUR     Imaging: DG Abd 1 View Result Date: 11/16/2023 CLINICAL DATA:  Abdominal  distension. EXAM: ABDOMEN - 1 VIEW portable supine COMPARISON:  X-ray 11/09/2023. FINDINGS: Right femoral catheter in place. Tip seen to the right of the spine at L5. Films are under penetration and there is motion. In addition the top of the diaphragm is clipped off the edge of the film as is the right lateral hemi abdominal edge. Gas is seen in nondilated loops of colon. Minimal small bowel gas. Overlapping presumed G-tube. Presumed vascular calcifications in the pelvis. Degenerative changes of the spine and pelvis. IMPRESSION: Limited x-rays. Nonspecific bowel gas pattern. Minimal small bowel gas. G-tube.  Right femoral double-lumen large-bore catheter. Electronically Signed   By: Ranell Charlanne HERO.D.  On: 11/16/2023 11:15      Medications:    feeding supplement (OSMOLITE 1.5 CAL) 65 mL/hr at 11/18/23 0609    sodium chloride    Intravenous Once   Chlorhexidine  Gluconate Cloth  6 each Topical Q0600   epoetin  alfa-epbx (RETACRIT ) injection  4,000 Units Intravenous Q M,W,F-HD   free water   30 mL Per Tube Q4H   gentamicin  ointment   Topical TID   heparin  injection (subcutaneous)  5,000 Units Subcutaneous Q8H   insulin  aspart  10 Units Subcutaneous Once   multivitamin  1 tablet Per Tube QHS   pantoprazole  (PROTONIX ) IV  40 mg Intravenous Q12H   acetaminophen  **OR** acetaminophen , artificial tears, bisacodyl , ipratropium-albuterol , [DISCONTINUED] ondansetron  **OR** ondansetron  (ZOFRAN ) IV, mouth rinse  Assessment/ Plan:  Robert Vega is a 83 y.o.  male with obstructive sleep apnea, GERD, obesity, hypertension, dysphagia, severe esophageal dysmotility with achalasia status post PEG tube placement, recent aspiration pneumonia acute respiratory failure status post extubation 10/09/2023, who was admitted to [facility] on 10/05/2023 for Aspiration into respiratory tract, initial encounter [T17.908A] Severe sepsis (HCC) [A41.9, R65.20] History of dysphagia [Z87.898] Fever, unspecified fever cause  [R50.9] Multifocal pneumonia [J18.9]   1.  Acute kidney injury.  Baseline creatinine 0.81 from 10/14/2023.  Acute kidney injury secondary to ATN, obstructive uropathy, and progression of prerenal azotemia.   Patient is critically ill.  He was started on urgent hemodialysis for severe hyperkalemia and uremia. -Plan for short-term dialysis to see if clearing of uremia and restoration of electrolyte balance helps improved clinical condition. - Patient seen and evaluated during hemodialysis treatment today.  Found to be tolerating well.  It appears that dialysis has made some difference in his underlying mental status.  If ongoing dialysis treatments desired he will need placement in outpatient hemodialysis center.   2.  Severe hyperkalemia-potassium elevated today at 6.3.  This should come down with dialysis treatments.   3.  Originally admitted for sepsis due to aspiration pneumonia, found to have distal esophageal obstruction with severe dysmotility.  He is status post botulinum toxin injection to the lower esophageal sphincter.  PEG tube placed this admission.    4.Anemia in the setting of renal failure Lab Results  Component Value Date   HGB 8.1 (L) 11/18/2023  Maintain the patient on Epogen  4000's IV with dialysis treatments and monitor her hemoglobin.   5.  Bilateral hydronephrosis Noted on CT.Urology team has evaluated the patient and it is thought to be secondary to reflux.  No intervention at this time due to fragile health.     LOS: 42 Robert Vega 6/23/202510:01 AM

## 2023-11-18 NOTE — Plan of Care (Signed)
  Problem: Clinical Measurements: Goal: Will remain free from infection Outcome: Progressing Goal: Respiratory complications will improve Outcome: Progressing Goal: Cardiovascular complication will be avoided Outcome: Progressing   Problem: Nutrition: Goal: Adequate nutrition will be maintained Outcome: Progressing   Problem: Elimination: Goal: Will not experience complications related to urinary retention Outcome: Progressing   Problem: Pain Managment: Goal: General experience of comfort will improve and/or be controlled Outcome: Progressing

## 2023-11-18 NOTE — Plan of Care (Signed)
 Problem: Education: Goal: Knowledge of General Education information will improve Description: Including pain rating scale, medication(s)/side effects and non-pharmacologic comfort measures Outcome: Progressing   Problem: Health Behavior/Discharge Planning: Goal: Ability to manage health-related needs will improve Outcome: Progressing   Problem: Clinical Measurements: Goal: Ability to maintain clinical measurements within normal limits will improve Outcome: Progressing Goal: Will remain free from infection Outcome: Progressing Goal: Diagnostic test results will improve Outcome: Progressing Goal: Respiratory complications will improve Outcome: Progressing Goal: Cardiovascular complication will be avoided Outcome: Progressing   Problem: Activity: Goal: Risk for activity intolerance will decrease Outcome: Progressing   Problem: Nutrition: Goal: Adequate nutrition will be maintained Outcome: Progressing   Problem: Coping: Goal: Level of anxiety will decrease Outcome: Progressing   Problem: Elimination: Goal: Will not experience complications related to bowel motility Outcome: Progressing Goal: Will not experience complications related to urinary retention Outcome: Progressing   Problem: Pain Managment: Goal: General experience of comfort will improve and/or be controlled Outcome: Progressing   Problem: Safety: Goal: Ability to remain free from injury will improve Outcome: Progressing   Problem: Skin Integrity: Goal: Risk for impaired skin integrity will decrease Outcome: Progressing   Problem: Activity: Goal: Ability to tolerate increased activity will improve Outcome: Progressing   Problem: Clinical Measurements: Goal: Ability to maintain a body temperature in the normal range will improve Outcome: Progressing   Problem: Respiratory: Goal: Ability to maintain adequate ventilation will improve Outcome: Progressing Goal: Ability to maintain a clear airway  will improve Outcome: Progressing   Problem: Education: Goal: Ability to describe self-care measures that may prevent or decrease complications (Diabetes Survival Skills Education) will improve Outcome: Progressing Goal: Individualized Educational Video(s) Outcome: Progressing   Problem: Coping: Goal: Ability to adjust to condition or change in health will improve Outcome: Progressing   Problem: Fluid Volume: Goal: Ability to maintain a balanced intake and output will improve Outcome: Progressing   Problem: Health Behavior/Discharge Planning: Goal: Ability to identify and utilize available resources and services will improve Outcome: Progressing Goal: Ability to manage health-related needs will improve Outcome: Progressing   Problem: Metabolic: Goal: Ability to maintain appropriate glucose levels will improve Outcome: Progressing   Problem: Nutritional: Goal: Maintenance of adequate nutrition will improve Outcome: Progressing Goal: Progress toward achieving an optimal weight will improve Outcome: Progressing   Problem: Skin Integrity: Goal: Risk for impaired skin integrity will decrease Outcome: Progressing   Problem: Tissue Perfusion: Goal: Adequacy of tissue perfusion will improve Outcome: Progressing   Problem: Activity: Goal: Ability to tolerate increased activity will improve Outcome: Progressing   Problem: Respiratory: Goal: Ability to maintain a clear airway and adequate ventilation will improve Outcome: Progressing   Problem: Role Relationship: Goal: Method of communication will improve Outcome: Progressing   Problem: Fluid Volume: Goal: Hemodynamic stability will improve Outcome: Progressing   Problem: Clinical Measurements: Goal: Diagnostic test results will improve Outcome: Progressing Goal: Signs and symptoms of infection will decrease Outcome: Progressing   Problem: Respiratory: Goal: Ability to maintain adequate ventilation will  improve Outcome: Progressing   Problem: Education: Goal: Knowledge of disease and its progression will improve Outcome: Progressing   Problem: Health Behavior/Discharge Planning: Goal: Ability to manage health-related needs will improve Outcome: Progressing   Problem: Clinical Measurements: Goal: Complications related to the disease process or treatment will be avoided or minimized Outcome: Progressing Goal: Dialysis access will remain free of complications Outcome: Progressing   Problem: Activity: Goal: Activity intolerance will improve Outcome: Progressing   Problem: Fluid Volume: Goal: Fluid volume balance will  be maintained or improved Outcome: Progressing

## 2023-11-19 DIAGNOSIS — A419 Sepsis, unspecified organism: Secondary | ICD-10-CM | POA: Diagnosis not present

## 2023-11-19 DIAGNOSIS — R6520 Severe sepsis without septic shock: Secondary | ICD-10-CM | POA: Diagnosis not present

## 2023-11-19 DIAGNOSIS — N186 End stage renal disease: Secondary | ICD-10-CM | POA: Diagnosis not present

## 2023-11-19 DIAGNOSIS — F0390 Unspecified dementia without behavioral disturbance: Secondary | ICD-10-CM | POA: Diagnosis not present

## 2023-11-19 LAB — BASIC METABOLIC PANEL WITH GFR
Anion gap: 12 (ref 5–15)
BUN: 62 mg/dL — ABNORMAL HIGH (ref 8–23)
CO2: 26 mmol/L (ref 22–32)
Calcium: 7.2 mg/dL — ABNORMAL LOW (ref 8.9–10.3)
Chloride: 95 mmol/L — ABNORMAL LOW (ref 98–111)
Creatinine, Ser: 6.47 mg/dL — ABNORMAL HIGH (ref 0.61–1.24)
GFR, Estimated: 8 mL/min — ABNORMAL LOW (ref >60)
Glucose, Bld: 126 mg/dL — ABNORMAL HIGH (ref 70–99)
Potassium: 6 mmol/L — ABNORMAL HIGH (ref 3.5–5.1)
Sodium: 133 mmol/L — ABNORMAL LOW (ref 135–145)

## 2023-11-19 LAB — GLUCOSE, CAPILLARY
Glucose-Capillary: 122 mg/dL — ABNORMAL HIGH (ref 70–99)
Glucose-Capillary: 123 mg/dL — ABNORMAL HIGH (ref 70–99)
Glucose-Capillary: 133 mg/dL — ABNORMAL HIGH (ref 70–99)
Glucose-Capillary: 133 mg/dL — ABNORMAL HIGH (ref 70–99)
Glucose-Capillary: 137 mg/dL — ABNORMAL HIGH (ref 70–99)

## 2023-11-19 LAB — RENAL FUNCTION PANEL
Albumin: 1.6 g/dL — ABNORMAL LOW (ref 3.5–5.0)
Anion gap: 9 (ref 5–15)
BUN: 54 mg/dL — ABNORMAL HIGH (ref 8–23)
CO2: 28 mmol/L (ref 22–32)
Calcium: 7 mg/dL — ABNORMAL LOW (ref 8.9–10.3)
Chloride: 98 mmol/L (ref 98–111)
Creatinine, Ser: 5.87 mg/dL — ABNORMAL HIGH (ref 0.61–1.24)
GFR, Estimated: 9 mL/min — ABNORMAL LOW (ref >60)
Glucose, Bld: 133 mg/dL — ABNORMAL HIGH (ref 70–99)
Phosphorus: 6.6 mg/dL — ABNORMAL HIGH (ref 2.5–4.6)
Potassium: 6.1 mmol/L — ABNORMAL HIGH (ref 3.5–5.1)
Sodium: 135 mmol/L (ref 135–145)

## 2023-11-19 MED ORDER — SODIUM ZIRCONIUM CYCLOSILICATE 5 G PO PACK
5.0000 g | PACK | Freq: Two times a day (BID) | ORAL | Status: AC
  Administered 2023-11-19 (×2): 5 g
  Filled 2023-11-19 (×2): qty 1

## 2023-11-19 MED ORDER — PROSOURCE TF20 ENFIT COMPATIBL EN LIQD
60.0000 mL | Freq: Every day | ENTERAL | Status: DC
  Administered 2023-11-19 – 2023-12-01 (×9): 60 mL
  Filled 2023-11-19 (×12): qty 60

## 2023-11-19 MED ORDER — NEPRO/CARBSTEADY PO LIQD
1000.0000 mL | ORAL | Status: DC
  Administered 2023-11-19 – 2023-11-26 (×5): 1000 mL via ORAL

## 2023-11-19 NOTE — Progress Notes (Signed)
 POA Paperwork sent to hospital confirming that [name], the granddaughter, is the patient's health care agent. Paperwork placed in chart for social work scanning. Brittny called requesting that she be called by the MD to inquire about why patient's WBC count is elevated but no antibiotics were ordered. Contacted the on call provider to let them know the family's wishes.

## 2023-11-19 NOTE — Progress Notes (Addendum)
 Nutrition Follow-up  DOCUMENTATION CODES:   Non-severe (moderate) malnutrition in context of acute illness/injury  INTERVENTION:   -Continue TF via PEG:   -D/c Osmolite 1.5  Initiate Nepro @ 50 ml/hr  60 ml Prosource TF daily  30 ml free water  flush every 4 hours  Tube feeding regimen provides 2240 kcal (100% of needs), 117 grams of protein, and 872 ml of H2O.  Total free water : 1052 ml daily  -Continue renal MVI daily via tube  NUTRITION DIAGNOSIS:   Moderate Malnutrition related to acute illness as evidenced by mild fat depletion, mild muscle depletion, moderate muscle depletion, percent weight loss.  Ongoing  GOAL:   Patient will meet greater than or equal to 90% of their needs  Met with TF  MONITOR:   PO intake, Labs, Weight trends, TF tolerance, I & O's, Skin  REASON FOR ASSESSMENT:   Consult Assessment of nutrition requirement/status  ASSESSMENT:   83 y/o male with h/o GERD, esophageal dysphagia, HTN, BPH, mood disorder and hiatal hernia who is admitted with aspiration event, PNA, sepsis, cardiac arrest and dysphagia.  5/13- s/p EGD- found to have food in the entire esophagus and gastric polyps. PEG tube placed  5/14- extubated 5/15- TF re-started 5/20- s/p EGD- esophageal dilation with botox  injection 5/22- advanced to clear liquid diet 5/24- pt pulled out PEG 5/25- PEG replaced by GI 5/26- KUB revealed some stool in the rectum and paucity of air in the colon, CT revealed G-tube not in the stomach, has spillage of contrast into the peritoneal cavity and moderate amount of pneumoperitoneum, TF d/c  5/27- s/p Exploratory laparotomy, takedown of gastrocutaneous fistula and repair of gastrotomy, placement of gastrostomy tube  5/28- TF resumed 6/10- s/p EGD- revealed dilation of entire esophagus, mildly severe esophagitis with no bleeding, staples removed per surgey 6/16- HD initiated secondary to hyperkalemia and worsening renal function, s/p Placement of  a 30 cm triple lumen dialysis catheter right femoral vein  Reviewed I/O's: -505 ml x 24 hours and +1.3 L since 11/05/23   Pt receiving personal care at time of visit.   Per nephology notes, pt lethargic today; HD has not made a significant improvement in mental status. Pt family would like to continue HD treatments.   Per GI notes, no plans to PO intake due to high aspiration intake and end-stage esophageal disease. Pt NPO and receives TF via PEG for sole source nutrition. Pt currently receiving Osmolite 1.5 @ 65 ml/hr. Pt continues to tolerate TF well.   MD requesting change of TF formula to Nepro. Due to HD with elevated K and Phos levels, RD agrees that this is an appropriate change. Fiber in Nepro may also assist with bulking up stools (noted type 7 stools).   Wt has been stable over the past week.   Per TOC notes, plan for SNF placement once medically stable.   Medications reviewed and include protonix  and lokelma .   Labs reviewed: K: 6.1, Phos: 6.6, CBGS: 133-145 (inpatient orders for glycemic control are none).    Diet Order:   Diet Order             Diet NPO time specified Except for: Ice Chips  Diet effective now                   EDUCATION NEEDS:   No education needs have been identified at this time  Skin:  Skin Assessment: Skin Integrity Issues: Skin Integrity Issues:: Incisions Incisions: abdomen Other: skin tear to lt  lower arm  Last BM:  11/19/23 (type 7)  Height:   Ht Readings from Last 1 Encounters:  10/08/23 5' 9.02 (1.753 m)    Weight:   Wt Readings from Last 1 Encounters:  11/19/23 102.6 kg    Ideal Body Weight:  72.7 kg  BMI:  Body mass index is 33.39 kg/m.  Estimated Nutritional Needs:   Kcal:  2150-2350  Protein:  105-120 grams  Fluid:  1000 ml + UOP    Margery ORN, RD, LDN, CDCES Registered Dietitian III Certified Diabetes Care and Education Specialist If unable to reach this RD, please use RD Inpatient group chat on  secure chat between hours of 8am-4 pm daily

## 2023-11-19 NOTE — Plan of Care (Signed)

## 2023-11-19 NOTE — Progress Notes (Signed)
 PT Cancellation Note  Patient Details Name: Robert Vega MRN: [id number] DOB: Nov 14, 1940   Cancelled Treatment:    Reason Eval/Treat Not Completed: Patient not medically ready.  Pt's K+ noted to be elevated to 6.1 this morning.  Per PT guidelines for elevated K+, will hold therapy at this time and re-attempt PT session at a later date/time as medically appropriate.  Damien Caulk, PT 11/19/23, 12:41 PM

## 2023-11-19 NOTE — Progress Notes (Signed)
 [facility]  ROUNDING NOTE   Subjective:   Patient seen and evaluated bedside. Not verbalizing very much today. Underwent dialysis treatment yesterday.   Objective:  Vital signs in last 24 hours:  Temp:  [97.9 F (36.6 C)-98.4 F (36.9 C)] 98.1 F (36.7 C) (06/24 0345) Pulse Rate:  [79-97] 79 (06/24 0731) Resp:  [20-33] 22 (06/24 0731) BP: (103-133)/(60-74) 127/67 (06/24 0731) SpO2:  [93 %-100 %] 94 % (06/24 0731) Weight:  [100.1 kg-102.6 kg] 102.6 kg (06/24 0345)  Weight change: -3 kg Filed Weights   11/18/23 0758 11/18/23 1212 11/19/23 0345  Weight: 102.1 kg 100.1 kg 102.6 kg    Intake/Output: I/O last 3 completed shifts: In: 4968 [NG/GT:4968] Out: 2000 [Other:2000]   Intake/Output this shift:  No intake/output data recorded.  Physical Exam: General: Chronically ill-appearing  Head: Oral mucosa moist  Eyes: Anicteric  Lungs:  Scattered rhonchi  Heart: Regular rate and rhythm  Abdomen:  Peg tube in place   Extremities: Trace peripheral edema.  Neurologic: Lethargic but arousable  Skin: No lesions  Access: Femoral dialysis catheter  Foley catheter in place  Basic Metabolic Panel: Recent Labs  Lab 11/12/23 2305 11/13/23 0423 11/14/23 0406 11/15/23 0900 11/18/23 0800 11/19/23 0412  NA 133* 134*  --  134* 134* 135  K 4.8 4.8  --  4.6 6.3* 6.1*  CL 96* 97*  --  97* 97* 98  CO2 23 25  --  26 26 28   GLUCOSE 103* 122*  --  131* 138* 133*  BUN 62* 69*  --  69* 77* 54*  CREATININE 4.94* 5.26*  --  6.92* 8.39* 5.87*  CALCIUM  7.2* 7.4*  --  6.8* 7.0* 7.0*  MG  --  2.1 2.1  --   --   --   PHOS 6.6*  --   --  7.6* 8.4* 6.6*    Liver Function Tests: Recent Labs  Lab 11/12/23 2305 11/13/23 0423 11/15/23 0900 11/18/23 0800 11/19/23 0412  AST  --  65*  --   --   --   ALT  --  76*  --   --   --   ALKPHOS  --  92  --   --   --   BILITOT  --  0.5  --   --   --   PROT  --  6.3*  --   --   --   ALBUMIN 1.6* 1.7* 1.7* 1.6* 1.6*   No results  for input(s): LIPASE, AMYLASE in the last 168 hours. No results for input(s): AMMONIA in the last 168 hours.  CBC: Recent Labs  Lab 11/13/23 0423 11/14/23 0406 11/15/23 0900 11/18/23 0800  WBC 11.4* 12.0* 13.9* 18.5*  HGB 6.7* 8.7* 7.9* 8.1*  HCT 21.0* 26.1* 23.9* 24.9*  MCV 92.1 92.2 91.9 93.3  PLT 309 289 304 386    Cardiac Enzymes: No results for input(s): CKTOTAL, CKMB, CKMBINDEX, TROPONINI in the last 168 hours.  BNP: Invalid input(s): POCBNP  CBG: Recent Labs  Lab 11/18/23 1623 11/18/23 2026 11/19/23 0008 11/19/23 0347 11/19/23 0731  GLUCAP 138* 134* 133* 137* 133*    Microbiology: Results for orders placed or performed during the hospital encounter of 10/05/23  Blood Culture (routine x 2)     Status: None   Collection Time: 10/05/23  5:06 AM   Specimen: BLOOD  Result Value Ref Range Status   Specimen Description BLOOD LA  Final   Special Requests   Final  BOTTLES DRAWN AEROBIC AND ANAEROBIC Blood Culture results may not be optimal due to an inadequate volume of blood received in culture bottles   Culture   Final    NO GROWTH 5 DAYS Performed at [facility], [address] [place], [place] [address]    Report Status 10/10/2023 FINAL  Final  Blood Culture (routine x 2)     Status: None   Collection Time: 10/05/23  5:07 AM   Specimen: BLOOD  Result Value Ref Range Status   Specimen Description BLOOD RA  Final   Special Requests   Final    BOTTLES DRAWN AEROBIC AND ANAEROBIC Blood Culture results may not be optimal due to an inadequate volume of blood received in culture bottles   Culture   Final    NO GROWTH 5 DAYS Performed at [facility], [address] [place], [place] [address]    Report Status 10/10/2023 FINAL  Final  Resp panel by RT-PCR (RSV, Flu A&B, Covid) Anterior Nasal Swab     Status: None   Collection Time: 10/05/23  5:56 AM   Specimen: Anterior Nasal Swab  Result Value Ref Range Status   SARS  Coronavirus 2 by RT PCR NEGATIVE NEGATIVE Final    Comment: (NOTE) SARS-CoV-2 target nucleic acids are NOT DETECTED.  The SARS-CoV-2 RNA is generally detectable in upper respiratory specimens during the acute phase of infection. The lowest concentration of SARS-CoV-2 viral copies this assay can detect is 138 copies/mL. A negative result does not preclude SARS-Cov-2 infection and should not be used as the sole basis for treatment or other patient management decisions. A negative result may occur with  improper specimen collection/handling, submission of specimen other than nasopharyngeal swab, presence of viral mutation(s) within the areas targeted by this assay, and inadequate number of viral copies(<138 copies/mL). A negative result must be combined with clinical observations, patient history, and epidemiological information. The expected result is Negative.  Fact Sheet for Patients:  BloggerCourse.com  Fact Sheet for Healthcare Providers:  SeriousBroker.it  This test is no t yet approved or cleared by the [place]  FDA and  has been authorized for detection and/or diagnosis of SARS-CoV-2 by FDA under an Emergency Use Authorization (EUA). This EUA will remain  in effect (meaning this test can be used) for the duration of the COVID-19 declaration under Section 564(b)(1) of the Act, 21 U.S.C.section 360bbb-3(b)(1), unless the authorization is terminated  or revoked sooner.       Influenza A by PCR NEGATIVE NEGATIVE Final   Influenza B by PCR NEGATIVE NEGATIVE Final    Comment: (NOTE) The Xpert Xpress SARS-CoV-2/FLU/RSV plus assay is intended as an aid in the diagnosis of influenza from Nasopharyngeal swab specimens and should not be used as a sole basis for treatment. Nasal washings and aspirates are unacceptable for Xpert Xpress SARS-CoV-2/FLU/RSV testing.  Fact Sheet for  Patients: BloggerCourse.com  Fact Sheet for Healthcare Providers: SeriousBroker.it  This test is not yet approved or cleared by the [place]  FDA and has been authorized for detection and/or diagnosis of SARS-CoV-2 by FDA under an Emergency Use Authorization (EUA). This EUA will remain in effect (meaning this test can be used) for the duration of the COVID-19 declaration under Section 564(b)(1) of the Act, 21 U.S.C. section 360bbb-3(b)(1), unless the authorization is terminated or revoked.     Resp Syncytial Virus by PCR NEGATIVE NEGATIVE Final    Comment: (NOTE) Fact Sheet for Patients: BloggerCourse.com  Fact Sheet for Healthcare Providers: SeriousBroker.it  This test is not yet approved or cleared by the [place]  FDA and has been authorized for detection and/or diagnosis of SARS-CoV-2 by FDA under an Emergency Use Authorization (EUA). This EUA will remain in effect (meaning this test can be used) for the duration of the COVID-19 declaration under Section 564(b)(1) of the Act, 21 U.S.C. section 360bbb-3(b)(1), unless the authorization is terminated or revoked.  Performed at [facility], [address] [place], [place] [address]   Respiratory (~20 pathogens) panel by PCR     Status: None   Collection Time: 10/05/23  8:11 AM   Specimen: Nasopharyngeal Swab; Respiratory  Result Value Ref Range Status   Adenovirus NOT DETECTED NOT DETECTED Final   Coronavirus 229E NOT DETECTED NOT DETECTED Final    Comment: (NOTE) The Coronavirus on the Respiratory Panel, DOES NOT test for the novel  Coronavirus (2019 nCoV)    Coronavirus HKU1 NOT DETECTED NOT DETECTED Final   Coronavirus NL63 NOT DETECTED NOT DETECTED Final   Coronavirus OC43 NOT DETECTED NOT DETECTED Final   Metapneumovirus NOT DETECTED NOT DETECTED Final   Rhinovirus / Enterovirus NOT DETECTED NOT  DETECTED Final   Influenza A NOT DETECTED NOT DETECTED Final   Influenza B NOT DETECTED NOT DETECTED Final   Parainfluenza Virus 1 NOT DETECTED NOT DETECTED Final   Parainfluenza Virus 2 NOT DETECTED NOT DETECTED Final   Parainfluenza Virus 3 NOT DETECTED NOT DETECTED Final   Parainfluenza Virus 4 NOT DETECTED NOT DETECTED Final   Respiratory Syncytial Virus NOT DETECTED NOT DETECTED Final   Bordetella pertussis NOT DETECTED NOT DETECTED Final   Bordetella Parapertussis NOT DETECTED NOT DETECTED Final   Chlamydophila pneumoniae NOT DETECTED NOT DETECTED Final   Mycoplasma pneumoniae NOT DETECTED NOT DETECTED Final    Comment: Performed at [facility], [address], [place], [place] [address]  Expectorated Sputum Assessment w Gram Stain, Rflx to Resp Cult     Status: None   Collection Time: 10/05/23  9:07 AM   Specimen: Sputum  Result Value Ref Range Status   Specimen Description SPUTUM  Final   Special Requests NONE  Final   Sputum evaluation   Final    Sputum specimen not acceptable for testing.  Please recollect.   C/KERRY NELSON AT 1005 10/05/23.PMF Performed at [facility], [address] [place], [place] [address]    Report Status 10/05/2023 FINAL  Final  Expectorated Sputum Assessment w Gram Stain, Rflx to Resp Cult     Status: None   Collection Time: 10/05/23 10:50 AM  Result Value Ref Range Status   Specimen Description EXPECTORATED SPUTUM  Final   Special Requests NONE  Final   Sputum evaluation   Final    THIS SPECIMEN IS ACCEPTABLE FOR SPUTUM CULTURE Performed at [facility], [address] [place], [place] [address]    Report Status 10/05/2023 FINAL  Final  Culture, Respiratory w Gram Stain     Status: None   Collection Time: 10/05/23 10:50 AM  Result Value Ref Range Status   Specimen Description   Final    EXPECTORATED SPUTUM Performed at [facility], [address], [place], [place] [address]    Special Requests    Final    NONE Reflexed from [id number] Performed at [facility], [address] [place], [place] [address]    Gram Stain   Final    RARE WBC SEEN RARE GRAM POSITIVE RODS RARE GRAM POSITIVE COCCI RARE VONNE  NEGATIVE RODS    Culture   Final    FEW Normal respiratory flora-no Staph aureus or Pseudomonas seen Performed at [facility], [address] [place], [place] [address]    Report Status 10/07/2023 FINAL  Final  MRSA Next Gen by PCR, Nasal     Status: None   Collection Time: 10/06/23 12:57 AM   Specimen: Nasal Mucosa; Nasal Swab  Result Value Ref Range Status   MRSA by PCR Next Gen NOT DETECTED NOT DETECTED Final    Comment: (NOTE) The GeneXpert MRSA Assay (FDA approved for NASAL specimens only), is one component of a comprehensive MRSA colonization surveillance program. It is not intended to diagnose MRSA infection nor to guide or monitor treatment for MRSA infections. Test performance is not FDA approved in patients less than 64 years old. Performed at [facility] [address] [place], [place] [address]   Culture, Respiratory w Gram Stain     Status: None   Collection Time: 10/06/23 11:37 AM   Specimen: INDUCED SPUTUM  Result Value Ref Range Status   Specimen Description   Final    INDUCED SPUTUM Performed at [facility], [address], [place], [place] [address]    Special Requests   Final    NONE Performed at [facility], [address] [place], [place] [address]    Gram Stain   Final    FEW WBC PRESENT,BOTH PMN AND MONONUCLEAR FEW GRAM POSITIVE RODS    Culture   Final    MODERATE LACTOBACILLUS FERMENTUM Standardized susceptibility testing for this organism is not available. Performed at [facility], [address], [place], [place] [address]    Report Status 10/09/2023 FINAL  Final  Culture, blood (x 2)     Status: None   Collection Time: 10/22/23  1:00 AM   Specimen: BLOOD  Result Value Ref Range  Status   Specimen Description BLOOD BLOOD RIGHT ARM  Final   Special Requests   Final    BOTTLES DRAWN AEROBIC AND ANAEROBIC Blood Culture adequate volume   Culture   Final    NO GROWTH 5 DAYS Performed at [facility], [address], [place], [place] [address]    Report Status 10/27/2023 FINAL  Final  Culture, blood (x 2)     Status: None   Collection Time: 10/22/23  1:00 AM   Specimen: BLOOD  Result Value Ref Range Status   Specimen Description BLOOD BLOOD RIGHT ARM  Final   Special Requests   Final    BOTTLES DRAWN AEROBIC AND ANAEROBIC Blood Culture adequate volume   Culture   Final    NO GROWTH 5 DAYS Performed at [facility], [address] [place], [place] [address]    Report Status 10/27/2023 FINAL  Final  Resp panel by RT-PCR (RSV, Flu A&B, Covid) Anterior Nasal Swab     Status: None   Collection Time: 11/09/23  5:31 PM   Specimen: Anterior Nasal Swab  Result Value Ref Range Status   SARS Coronavirus 2 by RT PCR NEGATIVE NEGATIVE Final    Comment: (NOTE) SARS-CoV-2 target nucleic acids are NOT DETECTED.  The SARS-CoV-2 RNA is generally detectable in upper respiratory specimens during the acute phase of infection. The lowest concentration of SARS-CoV-2 viral copies this assay can detect is 138 copies/mL. A negative result does not preclude SARS-Cov-2 infection and should not be used as the sole basis for treatment or other patient management decisions.  A negative result may occur with  improper specimen collection/handling, submission of specimen other than nasopharyngeal swab, presence of viral mutation(s) within the areas targeted by this assay, and inadequate number of viral copies(<138 copies/mL). A negative result must be combined with clinical observations, patient history, and epidemiological information. The expected result is Negative.  Fact Sheet for Patients:  BloggerCourse.com  Fact Sheet for Healthcare  Providers:  SeriousBroker.it  This test is no t yet approved or cleared by the [place]  FDA and  has been authorized for detection and/or diagnosis of SARS-CoV-2 by FDA under an Emergency Use Authorization (EUA). This EUA will remain  in effect (meaning this test can be used) for the duration of the COVID-19 declaration under Section 564(b)(1) of the Act, 21 U.S.C.section 360bbb-3(b)(1), unless the authorization is terminated  or revoked sooner.       Influenza A by PCR NEGATIVE NEGATIVE Final   Influenza B by PCR NEGATIVE NEGATIVE Final    Comment: (NOTE) The Xpert Xpress SARS-CoV-2/FLU/RSV plus assay is intended as an aid in the diagnosis of influenza from Nasopharyngeal swab specimens and should not be used as a sole basis for treatment. Nasal washings and aspirates are unacceptable for Xpert Xpress SARS-CoV-2/FLU/RSV testing.  Fact Sheet for Patients: BloggerCourse.com  Fact Sheet for Healthcare Providers: SeriousBroker.it  This test is not yet approved or cleared by the [place]  FDA and has been authorized for detection and/or diagnosis of SARS-CoV-2 by FDA under an Emergency Use Authorization (EUA). This EUA will remain in effect (meaning this test can be used) for the duration of the COVID-19 declaration under Section 564(b)(1) of the Act, 21 U.S.C. section 360bbb-3(b)(1), unless the authorization is terminated or revoked.     Resp Syncytial Virus by PCR NEGATIVE NEGATIVE Final    Comment: (NOTE) Fact Sheet for Patients: BloggerCourse.com  Fact Sheet for Healthcare Providers: SeriousBroker.it  This test is not yet approved or cleared by the [place]  FDA and has been authorized for detection and/or diagnosis of SARS-CoV-2 by FDA under an Emergency Use Authorization (EUA). This EUA will remain in effect (meaning this test can be  used) for the duration of the COVID-19 declaration under Section 564(b)(1) of the Act, 21 U.S.C. section 360bbb-3(b)(1), unless the authorization is terminated or revoked.  Performed at [facility], [address] [place], [place] [address]   Respiratory (~20 pathogens) panel by PCR     Status: None   Collection Time: 11/09/23  5:31 PM   Specimen: Nasopharyngeal Swab; Respiratory  Result Value Ref Range Status   Adenovirus NOT DETECTED NOT DETECTED Final   Coronavirus 229E NOT DETECTED NOT DETECTED Final    Comment: (NOTE) The Coronavirus on the Respiratory Panel, DOES NOT test for the novel  Coronavirus (2019 nCoV)    Coronavirus HKU1 NOT DETECTED NOT DETECTED Final   Coronavirus NL63 NOT DETECTED NOT DETECTED Final   Coronavirus OC43 NOT DETECTED NOT DETECTED Final   Metapneumovirus NOT DETECTED NOT DETECTED Final   Rhinovirus / Enterovirus NOT DETECTED NOT DETECTED Final   Influenza A NOT DETECTED NOT DETECTED Final   Influenza B NOT DETECTED NOT DETECTED Final   Parainfluenza Virus 1 NOT DETECTED NOT DETECTED Final   Parainfluenza Virus 2 NOT DETECTED NOT DETECTED Final   Parainfluenza Virus 3 NOT DETECTED NOT DETECTED Final   Parainfluenza Virus 4 NOT DETECTED NOT DETECTED Final   Respiratory Syncytial Virus NOT DETECTED NOT DETECTED Final   Bordetella pertussis NOT DETECTED NOT DETECTED Final  Bordetella Parapertussis NOT DETECTED NOT DETECTED Final   Chlamydophila pneumoniae NOT DETECTED NOT DETECTED Final   Mycoplasma pneumoniae NOT DETECTED NOT DETECTED Final    Comment: Performed at [facility], [address] [place], [place] [address]  Culture, blood (Routine X 2) w Reflex to ID Panel     Status: None (Preliminary result)   Collection Time: 11/09/23  6:16 PM   Specimen: BLOOD  Result Value Ref Range Status   Specimen Description BLOOD RIGHT ANTECUBITAL  Final   Special Requests   Final    BOTTLES DRAWN AEROBIC ONLY Blood Culture adequate  volume   Culture   Final    NO GROWTH 4 DAYS Performed at [facility], [address], [place], [place] [address]    Report Status PENDING  Incomplete  Culture, blood (Routine X 2) w Reflex to ID Panel     Status: None (Preliminary result)   Collection Time: 11/09/23  6:23 PM   Specimen: BLOOD  Result Value Ref Range Status   Specimen Description BLOOD BLOOD LEFT FOREARM  Final   Special Requests   Final    BOTTLES DRAWN AEROBIC AND ANAEROBIC Blood Culture adequate volume   Culture   Final    NO GROWTH 4 DAYS Performed at [facility], [address] [place], [place] [address]    Report Status PENDING  Incomplete  Urine Culture     Status: Abnormal   Collection Time: 11/10/23  1:58 AM   Specimen: Urine, Random  Result Value Ref Range Status   Specimen Description   Final    URINE, RANDOM Performed at [facility], [address] [place], [place] [address]    Special Requests   Final    NONE Reflexed from [phone] Performed at [facility], [address] [place], [place] [address]    Culture 60,000 COLONIES/mL ENTEROCOCCUS FAECALIS (A)  Final   Report Status 11/12/2023 FINAL  Final   Organism ID, Bacteria ENTEROCOCCUS FAECALIS (A)  Final      Susceptibility   Enterococcus faecalis - MIC*    AMPICILLIN  <=2 SENSITIVE Sensitive     NITROFURANTOIN <=16 SENSITIVE Sensitive     VANCOMYCIN 1 SENSITIVE Sensitive     * 60,000 COLONIES/mL ENTEROCOCCUS FAECALIS    Coagulation Studies: No results for input(s): LABPROT, INR in the last 72 hours.  Urinalysis: No results for input(s): COLORURINE, LABSPEC, PHURINE, GLUCOSEU, HGBUR, BILIRUBINUR, KETONESUR, PROTEINUR, UROBILINOGEN, NITRITE, LEUKOCYTESUR in the last 72 hours.  Invalid input(s): APPERANCEUR     Imaging: No results found.     Medications:    feeding supplement (OSMOLITE 1.5 CAL) 65 mL/hr at 11/19/23 9490    sodium chloride    Intravenous Once    Chlorhexidine  Gluconate Cloth  6 each Topical Q0600   epoetin  alfa-epbx (RETACRIT ) injection  4,000 Units Intravenous Q M,W,F-HD   free water   30 mL Per Tube Q4H   gentamicin  ointment   Topical TID   heparin  injection (subcutaneous)  5,000 Units Subcutaneous Q8H   insulin  aspart  10 Units Subcutaneous Once   multivitamin  1 tablet Per Tube QHS   pantoprazole  (PROTONIX ) IV  40 mg Intravenous Q12H   sodium zirconium cyclosilicate   5 g Per Tube BID   acetaminophen  **OR** acetaminophen , artificial tears, bisacodyl , ipratropium-albuterol , [DISCONTINUED] ondansetron  **OR** ondansetron  (ZOFRAN ) IV, mouth rinse  Assessment/ Plan:  Mr. Robert Vega is a 83 y.o.  male with obstructive sleep apnea, GERD, obesity, hypertension, dysphagia, severe esophageal dysmotility  with achalasia status post PEG tube placement, recent aspiration pneumonia acute respiratory failure status post extubation 10/09/2023, who was admitted to [facility] on 10/05/2023 for Aspiration into respiratory tract, initial encounter [T17.908A] Severe sepsis (HCC) [A41.9, R65.20] History of dysphagia [Z87.898] Fever, unspecified fever cause [R50.9] Multifocal pneumonia [J18.9]   1.  Acute kidney injury.  Baseline creatinine 0.81 from 10/14/2023.  Acute kidney injury secondary to ATN, obstructive uropathy, and progression of prerenal azotemia.   Patient is critically ill.  He was started on urgent hemodialysis for severe hyperkalemia and uremia. -Plan for short-term dialysis to see if clearing of uremia and restoration of electrolyte balance helps improved clinical condition. - Patient completed dialysis treatment yesterday on 11/18/2023.  UF achieved was 2 kg.  Quite lethargic today.  Therefore it does not appear that dialysis has made a significant difference in his underlying mental status.  It appears family would like to continue dialysis treatments at this time.   2.  Severe hyperkalemia-potassium still a bit high at 6.1.  Consider  changing tube feedings.  Maintain patient on Lokelma  5 g twice daily for now.   3.  Originally admitted for sepsis due to aspiration pneumonia, found to have distal esophageal obstruction with severe dysmotility.  He is status post botulinum toxin injection to the lower esophageal sphincter.  PEG tube placed this admission.    4.Anemia in the setting of renal failure Lab Results  Component Value Date   HGB 8.1 (L) 11/18/2023  Continue Epogen  4000's IV with dialysis on MWF schedule.   5.  Bilateral hydronephrosis Noted on CT.Urology team has evaluated the patient and it is thought to be secondary to reflux.  No intervention at this time due to fragile health.     LOS: 45 Robert Vega 6/24/20258:40 AM

## 2023-11-19 NOTE — Progress Notes (Addendum)
       CROSS COVER NOTE  NAME: Robert Vega MRN: [id number] DOB : Jan 20, 1941    Concern as stated by nurse / staff   Robert Vega is a 83 y.o male with significant PMH of OSA, GERD, Obesity, HTN, Dysphagia - presented to the ED 10/05/2023 with hypoxia, fever and generalized weakness. Dx sepsis/pneumonia, concern for aspiration. There was some confusion about his POA paperwork that began on day shift. We have received POA paperwork confirming that his granddaughter, Robert Vega, is the health care agent. Robert is concerned that her grandfather's WBC count is up but he does not have any antibiotics ordered. She is requesting that a physician call her tonight because she is afraid that his condition will worsen and she wants antibiotics ordered for him. Her number is [phone].      Pertinent findings on chart review: Patient admitted on 10/05/2023  Attending sign out reviewed: severe achalasia leading to aspiration, intubated initially. Treated with EGD and botox  but severe achalasia. NPO. PEG placed and several complications. Also started on dialysis. Needs OP HD set up. Please see progress note 06/15. UPDATES TO SON, Robert Vega     Patient Assessment   Assessment and  Interventions   Assessment:  Request by granddaughter for phone call  Plan: As the crosscover provider, Will abide by primary attending's signout  as well as current plan --> updates to son Robert Vega (as stated in all caps on sign out note)-verified that he is the primary contact X X

## 2023-11-19 NOTE — Progress Notes (Signed)
 PROGRESS NOTE Robert Vega    DOB: 06-25-1940, 83 y.o.  FMW:[id number]    Code Status: Full Code   DOA: 10/05/2023   LOS: 45  Brief hospital course  Robert Vega is a 83 y.o male with significant PMH of OSA, GERD, Obesity, HTN, Dysphagia - presented to the ED 10/05/2023 from with hypoxia, fever and generalized weakness. Dx sepsis/pneumonia, concern for aspiration    Of note, EGD 03/2022 with food in upper esophagus complicated by aspiration event, cardiac arrest with round of CPR, and post resuscitation EGD with concern for lack of peristalsis. Hx prior EGD 08/2020 with note of abnormal cricopharyngeus, decrease in motility in esophagus, and spastic LES    5/10: Admit to [facility] service with sepsis due to Aspiration Pneumonia.  Course complicated by Acute Respiratory Failure due to Aspiration of vomitus w/ cardiac arrest  transfer to ICU and intubation.  5/11: flexible bronchoscopy  5/13: PEG tube, +vomiting last night, failed SAT/SBT 5/14: extubated but with severe delirium 5/20:  Botulinum toxin injection into the lower esophageal sphincter by Dr. Jinny 5/21: esophagram showing diffusely distended esophagus with distal obstruction and severe dysmotility suggestive of achalasia. At that point patient was stabilized to point of pursuing SNF placement but then with complications of PEG tube placement resulted in return to OR 5/27 and tube feeds restarted 5/28 with zosyn  for peritonitis.  Had new melena 6/9 so underwent another EGD without acute bleeding seen.  Renal function declined and nephrology was consulted and without improvement with conservative management, he received dialysis access and started on HD 6/16 as this was in line with family goals of care.  Discharge will now be pending arrangement of facility and outpatient HD if he does not have renal recovery.  He remains in poor prognosis state with intermittently needing to hold tube feeds again for intolerance and needed blood  transfusion 6/18 for anemia likely related more to the renal failure as no source of bleeding has been identified thus far. Mental status is oriented to self and location and able to follow simple commands but otherwise confused. PT/OT recommends SNF.   Assessment & Plan  Goals of care- Son, [name] will be the primary and only point of contact and we will not discuss separately with granddaughter [name] unless she is on speaker phone or conference call w/ [name] present  - Re code status: [name] confirms that the patient would want CPR and life support if needed, even if unlikely to be successful. - SNF at dc with outpatient HD as it does not appear that he is having renal recovery at this time  AKI  Acute Renal Failure- secondary to ATN. now on HD. Had very little uop and irritation with foley so it was removed 6/19 Hyperkalemia- persistent. Addressed with HD yesterday but still 6.1 today. ECG stable. Lokelma  ordered per g-tube and ordered change of formula to nepro to avoid hyperkalemia from feeds  - bladder scan as needed if suspecting any retention - continue HD per nephrology  - Monitor UOP - will need outpatient HD set up if in line to continue with family GOC.  - patient is planned to be discharged to [facility] and would expect to be medically ready but the time OP HD is arranged.  - BMP am  Unasyn  was started for suspected UTI. Completed 6/20.  Decreased bowel sounds  Question ileus- resolved. Having regular Bms and seems to be tolerating tube feeds. - Monitor abdominal status frequently and reimage if concern  arises.   Anemia 2/2 renal failure and questionable melena- hgb decreased to 6.7 in setting of renal failure without signs of acute bleeding. Received 1 unit pRBCs 6/18 and hgb is stable. Repeat EGD 06/10 no concerns, GI has s/o. HH has been stable. Hgb 7.9>8.1 - CBC am. Maintain hgb goal >7    Achalasia  Dysphagia  Aspiration risk is high- Status post botulinum toxin  injection on 5/20, several EGD and SLP evals. Not safe for PO intake unless family wants to accept risk that he will 100% aspirate again and if that is the case, would recommend comfort care and then could liberalize diet for comfort.  - tube feeds as able, per RD - N.p.o. for the foreseeable future per GI - SLP to follow  - Will need outpatient referral to tertiary advanced GI for consideration of POEMS procedure.  Recommend this only after a few weeks recovery period.    Severe sepsis - resolved. Initially w/ aspiration pna. Later in hospital stay w/ intra-abdominal infection secondary to PEG dislodged Acute respiratory failure with hypoxia and hypercapnia  Pneumonia- resolved - remains on 2L [place]. 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 Reversible causes addressed and had significant improvement with initiating HD but still not back to previous state. I suspect he may have had anoxic brain injury during his critical illness in ICU requiring intubation and pressors. There was no signs of acute injury on head CT 5/16. Did show chronic microvascular ischemic disease.  Granddaughter has asked to discontinue prn antipsychotics   GERD (gastroesophageal reflux disease) Continue PPI    Essential hypertension, benign Bp wnl off meds Monitor VS     Generalized weakness - PT/OT ordered  Body mass index is 33.39 kg/m.  VTE ppx: heparin  injection 5,000 Units Start: 11/13/23 2200heparin  Diet:     Diet   Diet NPO time specified Except for: Ice Chips   Consultants: Nephrology  GI Vascular General surgery Palliative care CCM  Subjective 11/19/23    Pt reports no complaints. Denies pain. Had BM. He is more lethargic today but responds to voice.   Objective  Blood pressure 101/70, pulse 86, temperature 97.7 F (36.5 C),  temperature source Oral, resp. rate 19, height 5' 9.02 (1.753 m), weight 102.1 kg, SpO2 95%.  Intake/Output Summary (Last 24 hours) at 11/19/2023 0726 Last data filed at 11/19/2023 0509 Gross per 24 hour  Intake 1495 ml  Output 2000 ml  Net -505 ml   Filed Weights   11/18/23 0758 11/18/23 1212 11/19/23 0345  Weight: 102.1 kg 100.1 kg 102.6 kg    Physical Exam:  General: awake, alert to voice, NAD HEENT: atraumatic, MMM, hearing grossly normal Respiratory: normal respiratory effort. Significant stertor when sleeping Cardiovascular: quick capillary refill Gastrointestinal: soft, NT, ND Nervous: A&O xself and that he's in the hospital. Moving all limbs spontaneously Extremities: moves all equally, trace edema, normal tone Skin: dry, intact, normal temperature, normal color. No rashes, lesions or ulcers on exposed skin Psychiatry: calm and cooperative. Unable to follow commands.   Labs   I have personally reviewed the following labs and imaging studies CBC    Component Value Date/Time   WBC 18.5 (H) 11/18/2023 0800   RBC 2.67 (L) 11/18/2023 0800   HGB 8.1 (L) 11/18/2023 0800   HCT 24.9 (L) 11/18/2023 0800   PLT 386 11/18/2023  0800   MCV 93.3 11/18/2023 0800   MCH 30.3 11/18/2023 0800   MCHC 32.5 11/18/2023 0800   RDW 14.9 11/18/2023 0800   LYMPHSABS 1.6 10/31/2023 0218   MONOABS 2.3 (H) 10/31/2023 0218   EOSABS 0.1 10/31/2023 0218   BASOSABS 0.1 10/31/2023 0218      Latest Ref Rng & Units 11/19/2023    4:12 AM 11/18/2023    8:00 AM 11/15/2023    9:00 AM  BMP  Glucose 70 - 99 mg/dL 866  861  868   BUN 8 - 23 mg/dL 54  77  69   Creatinine 0.61 - 1.24 mg/dL 4.12  1.60  3.07   Sodium 135 - 145 mmol/L 135  134  134   Potassium 3.5 - 5.1 mmol/L 6.1  6.3  4.6   Chloride 98 - 111 mmol/L 98  97  97   CO2 22 - 32 mmol/L 28  26  26    Calcium  8.9 - 10.3 mg/dL 7.0  7.0  6.8    No results found.    Disposition Plan & Communication  Patient status: Inpatient  Admitted From:  Home Planned disposition location: SNF Anticipated discharge date: TBD pending renal function   Family Communication: son on phone   Author: Marien LITTIE Piety, DO [facility] 11/19/2023, 7:26 AM   Available by Epic secure chat 7AM-7PM. If 7PM-7AM, please contact night-coverage.  [facility] contact information found on [email or web address].

## 2023-11-19 NOTE — Progress Notes (Signed)
 OT Cancellation Note  Patient Details Name: Robert Vega MRN: [id number] DOB: 01-17-1941   Cancelled Treatment:    Reason Eval/Treat Not Completed: Medical issues which prohibited therapy. Chart reviewed. Pt noted with continued elevated K+, 6.1 this am, 6.0 this afternoon. Will hold therapy at this time and re-attempt OT session at later date/time as medically appropriate.   Troy Hartzog R., MPH, MS, OTR/L ascom [phone] 11/19/23, 1:36 PM

## 2023-11-20 DIAGNOSIS — R6520 Severe sepsis without septic shock: Secondary | ICD-10-CM | POA: Diagnosis not present

## 2023-11-20 DIAGNOSIS — N179 Acute kidney failure, unspecified: Secondary | ICD-10-CM | POA: Diagnosis not present

## 2023-11-20 DIAGNOSIS — A419 Sepsis, unspecified organism: Secondary | ICD-10-CM | POA: Diagnosis not present

## 2023-11-20 LAB — RENAL FUNCTION PANEL
Albumin: 1.6 g/dL — ABNORMAL LOW (ref 3.5–5.0)
Anion gap: 12 (ref 5–15)
BUN: 77 mg/dL — ABNORMAL HIGH (ref 8–23)
CO2: 25 mmol/L (ref 22–32)
Calcium: 7.3 mg/dL — ABNORMAL LOW (ref 8.9–10.3)
Chloride: 96 mmol/L — ABNORMAL LOW (ref 98–111)
Creatinine, Ser: 7.74 mg/dL — ABNORMAL HIGH (ref 0.61–1.24)
GFR, Estimated: 6 mL/min — ABNORMAL LOW (ref >60)
Glucose, Bld: 119 mg/dL — ABNORMAL HIGH (ref 70–99)
Phosphorus: 7.9 mg/dL — ABNORMAL HIGH (ref 2.5–4.6)
Potassium: 6.3 mmol/L (ref 3.5–5.1)
Sodium: 133 mmol/L — ABNORMAL LOW (ref 135–145)

## 2023-11-20 LAB — CBC
HCT: 24 % — ABNORMAL LOW (ref 39.0–52.0)
Hemoglobin: 7.5 g/dL — ABNORMAL LOW (ref 13.0–17.0)
MCH: 29.2 pg (ref 26.0–34.0)
MCHC: 31.3 g/dL (ref 30.0–36.0)
MCV: 93.4 fL (ref 80.0–100.0)
Platelets: 370 10*3/uL (ref 150–400)
RBC: 2.57 MIL/uL — ABNORMAL LOW (ref 4.22–5.81)
RDW: 14.6 % (ref 11.5–15.5)
WBC: 16.1 10*3/uL — ABNORMAL HIGH (ref 4.0–10.5)
nRBC: 0 % (ref 0.0–0.2)

## 2023-11-20 LAB — CULTURE, BLOOD (ROUTINE X 2)
Culture: NO GROWTH
Culture: NO GROWTH
Special Requests: ADEQUATE
Special Requests: ADEQUATE

## 2023-11-20 LAB — GLUCOSE, CAPILLARY
Glucose-Capillary: 116 mg/dL — ABNORMAL HIGH (ref 70–99)
Glucose-Capillary: 132 mg/dL — ABNORMAL HIGH (ref 70–99)
Glucose-Capillary: 126 mg/dL — ABNORMAL HIGH (ref 70–99)

## 2023-11-20 MED ORDER — ALTEPLASE 2 MG IJ SOLR
2.0000 mg | Freq: Once | INTRAMUSCULAR | Status: AC | PRN
Start: 2023-11-20 — End: 2023-11-20
  Administered 2023-11-20: 2 mg

## 2023-11-20 MED ORDER — EPOETIN ALFA-EPBX 4000 UNIT/ML IJ SOLN
INTRAMUSCULAR | Status: AC
  Filled 2023-11-20: qty 1

## 2023-11-20 MED ORDER — ALTEPLASE 2 MG IJ SOLR
INTRAMUSCULAR | Status: AC
  Filled 2023-11-20: qty 4

## 2023-11-20 MED ORDER — HEPARIN SODIUM (PORCINE) 1000 UNIT/ML DIALYSIS
1000.0000 [IU] | INTRAMUSCULAR | Status: DC | PRN
Start: 2023-11-20 — End: 2023-11-20

## 2023-11-20 MED ORDER — STERILE WATER FOR INJECTION IJ SOLN
INTRAMUSCULAR | Status: AC
  Filled 2023-11-20: qty 10

## 2023-11-20 NOTE — Progress Notes (Signed)
 PT Cancellation Note  Patient Details Name: Robert Vega MRN: [id number] DOB: 11/14/1940   Cancelled Treatment:     PT attempt. Pt off floor for HD. Will return later when pt is available to participate.    Rankin KATHEE Essex 11/20/2023, 10:51 AM

## 2023-11-20 NOTE — Progress Notes (Signed)
 I attempted to reach the patient's son Robert Vega at [phone] to obtain consent today for dialysis permacatheter placement tomorrow.  I was only able to leave a message to ask him to call me back.

## 2023-11-20 NOTE — Progress Notes (Addendum)
 Pt presented in Dialysis laying in large amount of watery stool. Pt was cleaned and linen changed.

## 2023-11-20 NOTE — Progress Notes (Signed)
  Received patient in bed to unit.   Informed consent signed and in chart.    TX duration:     Transported by  Hand-off given to patient's nurse.  Pt was given cath flow bec machine was lines was unable to run.  Received patient in bed to unit.   Informed consent signed and in chart.    TX duration:3:11     Transported by  Hand-off given to patient's nurse.    Access used: Cath Access issues: yes. Had trouble with catheter. Reversed lines and gave cath flow to infuse in a hour. System clotted at the last 19 mins.    Total UF removed: 2.7 kg Medication(s) given: Epo 4,000 Post HD VS: wnl Post HD weight: 100.9 kg     N. Nassir Neidert LPN [facility]

## 2023-11-20 NOTE — Plan of Care (Signed)
  Problem: Education: Goal: Knowledge of General Education information will improve Description: Including pain rating scale, medication(s)/side effects and non-pharmacologic comfort measures Outcome: Progressing   Problem: Health Behavior/Discharge Planning: Goal: Ability to manage health-related needs will improve Outcome: Progressing   Problem: Clinical Measurements: Goal: Ability to maintain clinical measurements within normal limits will improve Outcome: Progressing Goal: Will remain free from infection Outcome: Progressing Goal: Diagnostic test results will improve Outcome: Progressing Goal: Respiratory complications will improve Outcome: Progressing Goal: Cardiovascular complication will be avoided Outcome: Progressing   Problem: Activity: Goal: Risk for activity intolerance will decrease Outcome: Progressing   Problem: Nutrition: Goal: Adequate nutrition will be maintained Outcome: Progressing   Problem: Coping: Goal: Level of anxiety will decrease Outcome: Progressing   Problem: Elimination: Goal: Will not experience complications related to bowel motility Outcome: Progressing Goal: Will not experience complications related to urinary retention Outcome: Progressing   Problem: Pain Managment: Goal: General experience of comfort will improve and/or be controlled Outcome: Progressing   Problem: Safety: Goal: Ability to remain free from injury will improve Outcome: Progressing   Problem: Skin Integrity: Goal: Risk for impaired skin integrity will decrease Outcome: Progressing   Problem: Activity: Goal: Ability to tolerate increased activity will improve Outcome: Progressing   Problem: Clinical Measurements: Goal: Ability to maintain a body temperature in the normal range will improve Outcome: Progressing   Problem: Respiratory: Goal: Ability to maintain adequate ventilation will improve Outcome: Progressing Goal: Ability to maintain a clear airway  will improve Outcome: Progressing   Problem: Education: Goal: Ability to describe self-care measures that may prevent or decrease complications (Diabetes Survival Skills Education) will improve Outcome: Progressing Goal: Individualized Educational Video(s) Outcome: Progressing   Problem: Coping: Goal: Ability to adjust to condition or change in health will improve Outcome: Progressing   Problem: Fluid Volume: Goal: Ability to maintain a balanced intake and output will improve Outcome: Progressing   Problem: Health Behavior/Discharge Planning: Goal: Ability to identify and utilize available resources and services will improve Outcome: Progressing Goal: Ability to manage health-related needs will improve Outcome: Progressing   Problem: Metabolic: Goal: Ability to maintain appropriate glucose levels will improve Outcome: Progressing   Problem: Nutritional: Goal: Maintenance of adequate nutrition will improve Outcome: Progressing Goal: Progress toward achieving an optimal weight will improve Outcome: Progressing   Problem: Skin Integrity: Goal: Risk for impaired skin integrity will decrease Outcome: Progressing   Problem: Tissue Perfusion: Goal: Adequacy of tissue perfusion will improve Outcome: Progressing   Problem: Activity: Goal: Ability to tolerate increased activity will improve Outcome: Progressing   Problem: Respiratory: Goal: Ability to maintain a clear airway and adequate ventilation will improve Outcome: Progressing   Problem: Role Relationship: Goal: Method of communication will improve Outcome: Progressing   Problem: Fluid Volume: Goal: Hemodynamic stability will improve Outcome: Progressing   Problem: Clinical Measurements: Goal: Diagnostic test results will improve Outcome: Progressing Goal: Signs and symptoms of infection will decrease Outcome: Progressing   Problem: Respiratory: Goal: Ability to maintain adequate ventilation will  improve Outcome: Progressing

## 2023-11-20 NOTE — Progress Notes (Addendum)
 SLP Note  Patient Details Name: Robert Vega MRN: [id number] DOB: Nov 04, 1940   Cancelled treatment:       Reason Eval/Treat Not Completed:  (pt's status and chart reviewed w/ MD during consultation)  Pt's Family member reached out to MD re: request for pt to have something po. MD consulted w/ this SLP.  Per discussion w/ MD, pt has been multiple times as w/ BSEs(last one on 6/12) as well as w/ a MBSS, which revealed functional oropharyngeal phase swallowing w/ no pharyngeal aspiration noted. BUT, he has KNOWN ESOPHAGEAL PHASE DYSMOTILITY W/ REGURGITATION AND ASPIRATION OF REFLUX MATERIAL, NPO STATUS RECOMMENDED BY GI(per chart notes).  Per chart/GI notes: EGD 03/2022 with food in upper Esophagus complicated by aspiration event, cardiac arrest with round of CPR, and post resuscitation EGD with concern for lack of peristalsis. Prior EGD 08/2020 with note of abnormal cricopharyngeus, decrease in motility in esophagus, and spastic LES. Dilated to 18mm without change. Concern for ineffective esophageal motility.. Pt has reported: patient notes that when he lays down, he will in most cases start coughing and this can progress to emesis..   During THIS admit, pt's course was complicated by ARF d/t aspiration of VOMITUS, s/p cardiac arrest and transfer to CCU. Pt was successfully extubated ~3 days later; and s/p PEG placement.  On 10/15/2023: Botulinum toxin injection into the lower esophageal sphincter by Dr. Jinny, GI. Per MD and Imaging notes: 10/16/2023 Esophagram showing diffusely distended Esophagus with distal obstruction and severe dysmotility suggestive of Achalasia..   It was shared w/ MD that Speech feels at this point it would be GI who would make recommendations for any oral diet and manage the Esophageal phase Dysmotility and possible Regurgitation w/ such, in setting of the Known. severe Esophageal phase Dysmotility and the risk for the REFLUX aspiration. Noted pt is still a FULL CODE  status.        Comer Portugal, MS, CCC-SLP Speech Language Pathologist Rehab Services; [facility] [phone] (ascom) Kahli Mayon 11/20/2023, 5:49 PM

## 2023-11-20 NOTE — Progress Notes (Signed)
 PT Cancellation Note  Patient Details Name: Robert Vega MRN: [id number] DOB: [birth year]   Cancelled Treatment:      Pt was supine in bed upon arrival with supportive friend at bedside. Pt extremely lethargic and unable to safely participate at this time. Pt's cervical spine facing towards R. Author encouraged pt to turn head and assist however pt unable to tolerate. Yells out in pain with very minimal cervical rotation L. MD had cleared participation even in setting of elevated K+. Author will personal return in the morning to promote increased OOB activity and increased participation.    Rankin KATHEE Essex 11/20/2023, 4:49 PM

## 2023-11-20 NOTE — TOC Progression Note (Signed)
 Transition of Care [facility]) - Progression Note    Patient Details  Name: Robert Vega MRN: [id number] Date of Birth: 1940-07-11  Transition of Care [facility]) CM/SW Contact  Lauraine [name], LCSW Phone Number: 11/20/2023, 8:07 AM  Clinical Narrative:   Granddaughter emailed HCPOA paperwork to CSW. It names granddaughter as primary HCPOA and son, [name], as secondary. Will place in chart to be scanned in whenever discharged.  Expected Discharge Plan and Services                                               Social Determinants of Health (SDOH) Interventions SDOH Screenings   Food Insecurity: Patient Unable To Answer (10/06/2023)  Recent Concern: Food Insecurity - Food Insecurity Present (09/11/2023)   Received from [facility]  Housing: Patient Unable To Answer (10/06/2023)  Recent Concern: Housing - High Risk (09/11/2023)   Received from [facility]  Transportation Needs: Patient Unable To Answer (10/06/2023)  Utilities: Patient Unable To Answer (10/06/2023)  Depression (PHQ2-9): Low Risk  (05/07/2023)  Financial Resource Strain: Medium Risk (09/11/2023)   Received from [facility]  Social Connections: Unknown (10/06/2023)  Tobacco Use: Medium Risk (11/05/2023)    Readmission Risk Interventions     No data to display

## 2023-11-20 NOTE — Progress Notes (Signed)
 PROGRESS NOTE Robert Vega    DOB: March 18, 1941, 83 y.o.  FMW:[id number]    Code Status: Full Code   DOA: 10/05/2023   LOS: 46  Brief hospital course  Robert Vega is a 83 y.o male with significant PMH of OSA, GERD, Obesity, HTN, Dysphagia - presented to the ED 10/05/2023 from with hypoxia, fever and generalized weakness. Dx sepsis/pneumonia, concern for aspiration    Of note, EGD 03/2022 with food in upper esophagus complicated by aspiration event, cardiac arrest with round of CPR, and post resuscitation EGD with concern for lack of peristalsis. Hx prior EGD 08/2020 with note of abnormal cricopharyngeus, decrease in motility in esophagus, and spastic LES    5/10: Admit to [facility] service with sepsis due to Aspiration Pneumonia.  Course complicated by Acute Respiratory Failure due to Aspiration of vomitus w/ cardiac arrest  transfer to ICU and intubation.  5/11: flexible bronchoscopy  5/13: PEG tube, +vomiting last night, failed SAT/SBT 5/14: extubated but with severe delirium 5/20:  Botulinum toxin injection into the lower esophageal sphincter by Dr. Jinny 5/21: esophagram showing diffusely distended esophagus with distal obstruction and severe dysmotility suggestive of achalasia. At that point patient was stabilized to point of pursuing SNF placement but then with complications of PEG tube placement resulted in return to OR 5/27 and tube feeds restarted 5/28 with zosyn  for peritonitis.  Had new melena 6/9 so underwent another EGD without acute bleeding seen.  Renal function declined and nephrology was consulted and without improvement with conservative management, he received dialysis access and started on HD 6/16 as this was in line with family goals of care.  Discharge will now be pending arrangement of facility and outpatient HD if he does not have renal recovery.  He remains in poor prognosis state with intermittently needing to hold tube feeds again for intolerance and needed blood  transfusion 6/18 for anemia likely related more to the renal failure as no source of bleeding has been identified thus far. Mental status is oriented to self and location and able to follow simple commands but otherwise confused. PT/OT recommends SNF.  Assessment & Plan  Goals of care- HCPOA paperwork received 6/25 indicating granddaughter Robert Vega is primary and Robert Vega is secondary. Updates to be given to only primary and they can talk amongst themselves or both be present on update calls but cannot support 50min+ calls for multiple family members daily.  - Re code status: family confirms that he is to remain full code - SNF at dc with outpatient HD as it does not appear that he is having renal recovery at this time - they want full scope of care and appear to have expectations of full recovery despite poor progress and prognosis.   AKI  Acute Renal Failure- secondary to ATN. now on HD. Had very little uop and irritation with foley so it was removed 6/19. Patients granddaughter insistent on getting urinalysis to check for UTI. Just completed course of unasyn  6/20 for possible UTI. He has minimal UOP and incontinent so clean catch is not possible and unlikely to get reliable results from catheterization which was explained to her but she still insists we check.  Hyperkalemia- persistent. Addressed with HD today. Received lokelma  on non-HD days, changed nutrition to low-K+ formula, nepro yesterday. - bladder scan as needed if suspecting any retention - continue HD per nephrology  - Monitor UOP - will need outpatient HD set up if in line to continue with family GOC.  - patient is  planned to be discharged to [facility] and would expect to be medically ready but the time OP HD is arranged.  - BMP am  Decreased bowel sounds  Question ileus- resolved. Having regular Bms and seems to be tolerating tube feeds. - Monitor abdominal status frequently and reimage if concern arises.   Anemia 2/2 renal  failure and questionable melena- hgb decreased to 6.7 in setting of renal failure without signs of acute bleeding. Received 1 unit pRBCs 6/18 and hgb is stable. Repeat EGD 06/10 no concerns, GI has s/o. HH has been stable. Hgb 7.9>8.1>7.5 - CBC am. Maintain hgb goal >7    Achalasia  Dysphagia  Aspiration risk is high- Status post botulinum toxin injection on 5/20, several EGD and SLP evals. Not safe for PO intake unless family wants to accept risk that he will 100% aspirate again and if that is the case, would recommend comfort care and then could liberalize diet for comfort.  - tube feeds as able, per RD - N.p.o. for the foreseeable future per GI - SLP evaluated, last saw on 6/12. Again reached out on 6/25 due to granddaughter insistence and recommend diet is GI driven.  - Consider outpatient referral to tertiary advanced GI for consideration of POEMS procedure.  Recommend this only after a few weeks recovery period.    Severe sepsis - resolved. Initially w/ aspiration pna. Later in hospital stay w/ intra-abdominal infection secondary to PEG dislodged Acute respiratory failure with hypoxia and hypercapnia  Pneumonia- resolved - remains on 2L [place]. 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 Reversible causes addressed and had significant improvement with initiating HD but still not back to previous state. I suspect he may have had anoxic brain injury during his critical illness in ICU requiring intubation and pressors. There was no signs of acute injury on head CT 5/16. Did show chronic microvascular ischemic disease.  Granddaughter has asked to discontinue prn antipsychotics   GERD (gastroesophageal reflux disease) Continue PPI   Essential hypertension, benign Bp wnl off meds Monitor VS     Generalized weakness - PT/OT  ordered  Body mass index is 33.39 kg/m.  VTE ppx: heparin  injection 5,000 Units Start: 11/13/23 2200heparin  Diet:     Diet   Diet NPO time specified Except for: Ice Chips   Consultants: Nephrology  GI Vascular General surgery Palliative care CCM  Subjective 11/20/23    Pt reports no complaints. Denies pain. Had BM. More alert than yesterday.    Objective  Blood pressure 101/70, pulse 86, temperature 97.7 F (36.5 C), temperature source Oral, resp. rate 19, height 5' 9.02 (1.753 m), weight 102.1 kg, SpO2 95%.  Intake/Output Summary (Last 24 hours) at 11/20/2023 0723 Last data filed at 11/19/2023 2120 Gross per 24 hour  Intake 60 ml  Output --  Net 60 ml   Filed Weights   11/18/23 0758 11/18/23 1212 11/19/23 0345  Weight: 102.1 kg 100.1 kg 102.6 kg    Physical Exam:  General: awake, alert to voice, NAD HEENT: atraumatic, MMM, hearing grossly normal Respiratory: normal respiratory effort. Significant stertor when sleeping Cardiovascular: quick capillary refill Gastrointestinal: soft, NT, ND Nervous: A&O xself and that he's in the hospital. Moving all limbs spontaneously Extremities: moves all equally, trace edema, normal tone Skin: dry, intact, normal temperature, normal color. No rashes, lesions or ulcers on exposed skin  Psychiatry: calm and cooperative. able to follow commands.   Labs   I have personally reviewed the following labs and imaging studies CBC    Component Value Date/Time   WBC 16.1 (H) 11/20/2023 0432   RBC 2.57 (L) 11/20/2023 0432   HGB 7.5 (L) 11/20/2023 0432   HCT 24.0 (L) 11/20/2023 0432   PLT 370 11/20/2023 0432   MCV 93.4 11/20/2023 0432   MCH 29.2 11/20/2023 0432   MCHC 31.3 11/20/2023 0432   RDW 14.6 11/20/2023 0432   LYMPHSABS 1.6 10/31/2023 0218   MONOABS 2.3 (H) 10/31/2023 0218   EOSABS 0.1 10/31/2023 0218   BASOSABS 0.1 10/31/2023 0218      Latest Ref Rng & Units 11/19/2023   12:48 PM 11/19/2023    4:12 AM 11/18/2023     8:00 AM  BMP  Glucose 70 - 99 mg/dL 873  866  861   BUN 8 - 23 mg/dL 62  54  77   Creatinine 0.61 - 1.24 mg/dL 3.52  4.12  1.60   Sodium 135 - 145 mmol/L 133  135  134   Potassium 3.5 - 5.1 mmol/L 6.0  6.1  6.3   Chloride 98 - 111 mmol/L 95  98  97   CO2 22 - 32 mmol/L 26  28  26    Calcium  8.9 - 10.3 mg/dL 7.2  7.0  7.0    No results found.    Disposition Plan & Communication  Patient status: Inpatient  Admitted From: Home Planned disposition location: SNF Anticipated discharge date: TBD pending renal function   Family Communication: dranddaughter on phone   Author: Marien LITTIE Piety, DO [facility] 11/20/2023, 7:23 AM   Available by Epic secure chat 7AM-7PM. If 7PM-7AM, please contact night-coverage.  TR[facility] contact information found on [email or web address].

## 2023-11-20 NOTE — Progress Notes (Signed)
 OT Cancellation Note  Patient Details Name: KAMONI GENTLES MRN: [id number] DOB: Sep 25, 1940   Cancelled Treatment:    Reason Eval/Treat Not Completed: Medical issues which prohibited therapy.  Chart reviewed. Pt noted with continued elevated K+, 6.3 this AM. Will hold therapy at this time and re-attempt OT session at later date/time as medically appropriate.     Shawntee Mainwaring L. Arlo Buffone, OTR/L  11/20/23, 8:47 AM

## 2023-11-20 NOTE — Plan of Care (Signed)
 Problem: Education: Goal: Knowledge of General Education information will improve Description: Including pain rating scale, medication(s)/side effects and non-pharmacologic comfort measures Outcome: Progressing   Problem: Health Behavior/Discharge Planning: Goal: Ability to manage health-related needs will improve Outcome: Progressing   Problem: Clinical Measurements: Goal: Ability to maintain clinical measurements within normal limits will improve Outcome: Progressing Goal: Will remain free from infection Outcome: Progressing Goal: Diagnostic test results will improve Outcome: Progressing Goal: Respiratory complications will improve Outcome: Progressing Goal: Cardiovascular complication will be avoided Outcome: Progressing   Problem: Activity: Goal: Risk for activity intolerance will decrease Outcome: Progressing   Problem: Nutrition: Goal: Adequate nutrition will be maintained Outcome: Progressing   Problem: Coping: Goal: Level of anxiety will decrease Outcome: Progressing   Problem: Elimination: Goal: Will not experience complications related to bowel motility Outcome: Progressing Goal: Will not experience complications related to urinary retention Outcome: Progressing   Problem: Pain Managment: Goal: General experience of comfort will improve and/or be controlled Outcome: Progressing   Problem: Safety: Goal: Ability to remain free from injury will improve Outcome: Progressing   Problem: Skin Integrity: Goal: Risk for impaired skin integrity will decrease Outcome: Progressing   Problem: Activity: Goal: Ability to tolerate increased activity will improve Outcome: Progressing   Problem: Clinical Measurements: Goal: Ability to maintain a body temperature in the normal range will improve Outcome: Progressing   Problem: Respiratory: Goal: Ability to maintain adequate ventilation will improve Outcome: Progressing Goal: Ability to maintain a clear airway  will improve Outcome: Progressing   Problem: Education: Goal: Ability to describe self-care measures that may prevent or decrease complications (Diabetes Survival Skills Education) will improve Outcome: Progressing Goal: Individualized Educational Video(s) Outcome: Progressing   Problem: Coping: Goal: Ability to adjust to condition or change in health will improve Outcome: Progressing   Problem: Fluid Volume: Goal: Ability to maintain a balanced intake and output will improve Outcome: Progressing   Problem: Health Behavior/Discharge Planning: Goal: Ability to identify and utilize available resources and services will improve Outcome: Progressing Goal: Ability to manage health-related needs will improve Outcome: Progressing   Problem: Metabolic: Goal: Ability to maintain appropriate glucose levels will improve Outcome: Progressing   Problem: Nutritional: Goal: Maintenance of adequate nutrition will improve Outcome: Progressing Goal: Progress toward achieving an optimal weight will improve Outcome: Progressing   Problem: Skin Integrity: Goal: Risk for impaired skin integrity will decrease Outcome: Progressing   Problem: Tissue Perfusion: Goal: Adequacy of tissue perfusion will improve Outcome: Progressing   Problem: Activity: Goal: Ability to tolerate increased activity will improve Outcome: Progressing   Problem: Respiratory: Goal: Ability to maintain a clear airway and adequate ventilation will improve Outcome: Progressing   Problem: Role Relationship: Goal: Method of communication will improve Outcome: Progressing   Problem: Fluid Volume: Goal: Hemodynamic stability will improve Outcome: Progressing   Problem: Clinical Measurements: Goal: Diagnostic test results will improve Outcome: Progressing Goal: Signs and symptoms of infection will decrease Outcome: Progressing   Problem: Respiratory: Goal: Ability to maintain adequate ventilation will  improve Outcome: Progressing   Problem: Education: Goal: Knowledge of disease and its progression will improve Outcome: Progressing   Problem: Health Behavior/Discharge Planning: Goal: Ability to manage health-related needs will improve Outcome: Progressing   Problem: Clinical Measurements: Goal: Complications related to the disease process or treatment will be avoided or minimized Outcome: Progressing Goal: Dialysis access will remain free of complications Outcome: Progressing   Problem: Activity: Goal: Activity intolerance will improve Outcome: Progressing   Problem: Fluid Volume: Goal: Fluid volume balance will  be maintained or improved Outcome: Progressing

## 2023-11-20 NOTE — Progress Notes (Signed)
 [facility]  ROUNDING NOTE   Subjective:   Patient seen and evaluated during dialysis   HEMODIALYSIS FLOWSHEET:  Blood Flow Rate (mL/min): 239 mL/min Arterial Pressure (mmHg): -81.61 mmHg Venous Pressure (mmHg): 93.13 mmHg TMP (mmHg): 20.4 mmHg Ultrafiltration Rate (mL/min): 949 mL/min Dialysate Flow Rate (mL/min): 0 ml/min  Moaning and mumbling incoherently    Objective:  Vital signs in last 24 hours:  Temp:  [97.8 F (36.6 C)-99.9 F (37.7 C)] 99.9 F (37.7 C) (06/25 0828) Pulse Rate:  [85-95] 85 (06/25 1030) Resp:  [20-34] 32 (06/25 1030) BP: (97-154)/(52-82) 129/52 (06/25 1030) SpO2:  [93 %-100 %] 96 % (06/25 1030) Weight:  [103.6 kg] 103.6 kg (06/25 0821)  Weight change:  Filed Weights   11/18/23 1212 11/19/23 0345 11/20/23 0821  Weight: 100.1 kg 102.6 kg 103.6 kg    Intake/Output: I/O last 3 completed shifts: In: 844.3 [Other:60; NG/GT:784.3] Out: -    Intake/Output this shift:  No intake/output data recorded.  Physical Exam: General: Chronically ill-appearing  Head: Oral mucosa moist  Eyes: Anicteric  Lungs:  Scattered rhonchi  Heart: Regular rate and rhythm  Abdomen:  Peg tube in place   Extremities: Trace peripheral edema.  Neurologic: Moaning only  Skin: No lesions  Access: Femoral dialysis catheter  Foley catheter in place  Basic Metabolic Panel: Recent Labs  Lab 11/14/23 0406 11/15/23 0900 11/15/23 0900 11/18/23 0800 11/19/23 0412 11/19/23 1248 11/20/23 0755  NA  --  134*  --  134* 135 133* 133*  K  --  4.6  --  6.3* 6.1* 6.0* 6.3*  CL  --  97*  --  97* 98 95* 96*  CO2  --  26  --  26 28 26 25   GLUCOSE  --  131*  --  138* 133* 126* 119*  BUN  --  69*  --  77* 54* 62* 77*  CREATININE  --  6.92*  --  8.39* 5.87* 6.47* 7.74*  CALCIUM   --  6.8*   < > 7.0* 7.0* 7.2* 7.3*  MG 2.1  --   --   --   --   --   --   PHOS  --  7.6*  --  8.4* 6.6*  --  7.9*   < > = values in this interval not displayed.    Liver Function  Tests: Recent Labs  Lab 11/15/23 0900 11/18/23 0800 11/19/23 0412 11/20/23 0755  ALBUMIN 1.7* 1.6* 1.6* 1.6*   No results for input(s): LIPASE, AMYLASE in the last 168 hours. No results for input(s): AMMONIA in the last 168 hours.  CBC: Recent Labs  Lab 11/14/23 0406 11/15/23 0900 11/18/23 0800 11/20/23 0432  WBC 12.0* 13.9* 18.5* 16.1*  HGB 8.7* 7.9* 8.1* 7.5*  HCT 26.1* 23.9* 24.9* 24.0*  MCV 92.2 91.9 93.3 93.4  PLT 289 304 386 370    Cardiac Enzymes: No results for input(s): CKTOTAL, CKMB, CKMBINDEX, TROPONINI in the last 168 hours.  BNP: Invalid input(s): POCBNP  CBG: Recent Labs  Lab 11/19/23 0731 11/19/23 1138 11/19/23 1532 11/20/23 0700 11/20/23 0745  GLUCAP 133* 122* 123* 116* 132*    Microbiology: Results for orders placed or performed during the hospital encounter of 10/05/23  Blood Culture (routine x 2)     Status: None   Collection Time: 10/05/23  5:06 AM   Specimen: BLOOD  Result Value Ref Range Status   Specimen Description BLOOD LA  Final   Special Requests   Final  BOTTLES DRAWN AEROBIC AND ANAEROBIC Blood Culture results may not be optimal due to an inadequate volume of blood received in culture bottles   Culture   Final    NO GROWTH 5 DAYS Performed at [facility], [address] [place], [place] [address]    Report Status 10/10/2023 FINAL  Final  Blood Culture (routine x 2)     Status: None   Collection Time: 10/05/23  5:07 AM   Specimen: BLOOD  Result Value Ref Range Status   Specimen Description BLOOD RA  Final   Special Requests   Final    BOTTLES DRAWN AEROBIC AND ANAEROBIC Blood Culture results may not be optimal due to an inadequate volume of blood received in culture bottles   Culture   Final    NO GROWTH 5 DAYS Performed at [facility], [address] [place], [place] [address]    Report Status 10/10/2023 FINAL  Final  Resp panel by RT-PCR (RSV, Flu A&B, Covid) Anterior Nasal  Swab     Status: None   Collection Time: 10/05/23  5:56 AM   Specimen: Anterior Nasal Swab  Result Value Ref Range Status   SARS Coronavirus 2 by RT PCR NEGATIVE NEGATIVE Final    Comment: (NOTE) SARS-CoV-2 target nucleic acids are NOT DETECTED.  The SARS-CoV-2 RNA is generally detectable in upper respiratory specimens during the acute phase of infection. The lowest concentration of SARS-CoV-2 viral copies this assay can detect is 138 copies/mL. A negative result does not preclude SARS-Cov-2 infection and should not be used as the sole basis for treatment or other patient management decisions. A negative result may occur with  improper specimen collection/handling, submission of specimen other than nasopharyngeal swab, presence of viral mutation(s) within the areas targeted by this assay, and inadequate number of viral copies(<138 copies/mL). A negative result must be combined with clinical observations, patient history, and epidemiological information. The expected result is Negative.  Fact Sheet for Patients:  BloggerCourse.com  Fact Sheet for Healthcare Providers:  SeriousBroker.it  This test is no t yet approved or cleared by the [place]  FDA and  has been authorized for detection and/or diagnosis of SARS-CoV-2 by FDA under an Emergency Use Authorization (EUA). This EUA will remain  in effect (meaning this test can be used) for the duration of the COVID-19 declaration under Section 564(b)(1) of the Act, 21 U.S.C.section 360bbb-3(b)(1), unless the authorization is terminated  or revoked sooner.       Influenza A by PCR NEGATIVE NEGATIVE Final   Influenza B by PCR NEGATIVE NEGATIVE Final    Comment: (NOTE) The Xpert Xpress SARS-CoV-2/FLU/RSV plus assay is intended as an aid in the diagnosis of influenza from Nasopharyngeal swab specimens and should not be used as a sole basis for treatment. Nasal washings and aspirates  are unacceptable for Xpert Xpress SARS-CoV-2/FLU/RSV testing.  Fact Sheet for Patients: BloggerCourse.com  Fact Sheet for Healthcare Providers: SeriousBroker.it  This test is not yet approved or cleared by the [place]  FDA and has been authorized for detection and/or diagnosis of SARS-CoV-2 by FDA under an Emergency Use Authorization (EUA). This EUA will remain in effect (meaning this test can be used) for the duration of the COVID-19 declaration under Section 564(b)(1) of the Act, 21 U.S.C. section 360bbb-3(b)(1), unless the authorization is terminated or revoked.     Resp Syncytial Virus by PCR NEGATIVE NEGATIVE Final    Comment: (NOTE) Fact Sheet for Patients: BloggerCourse.com  Fact Sheet for Healthcare Providers: SeriousBroker.it  This test is not yet approved or cleared by the [place]  FDA and has been authorized for detection and/or diagnosis of SARS-CoV-2 by FDA under an Emergency Use Authorization (EUA). This EUA will remain in effect (meaning this test can be used) for the duration of the COVID-19 declaration under Section 564(b)(1) of the Act, 21 U.S.C. section 360bbb-3(b)(1), unless the authorization is terminated or revoked.  Performed at [facility], [address] [place], [place] [address]   Respiratory (~20 pathogens) panel by PCR     Status: None   Collection Time: 10/05/23  8:11 AM   Specimen: Nasopharyngeal Swab; Respiratory  Result Value Ref Range Status   Adenovirus NOT DETECTED NOT DETECTED Final   Coronavirus 229E NOT DETECTED NOT DETECTED Final    Comment: (NOTE) The Coronavirus on the Respiratory Panel, DOES NOT test for the novel  Coronavirus (2019 nCoV)    Coronavirus HKU1 NOT DETECTED NOT DETECTED Final   Coronavirus NL63 NOT DETECTED NOT DETECTED Final   Coronavirus OC43 NOT DETECTED NOT DETECTED Final   Metapneumovirus  NOT DETECTED NOT DETECTED Final   Rhinovirus / Enterovirus NOT DETECTED NOT DETECTED Final   Influenza A NOT DETECTED NOT DETECTED Final   Influenza B NOT DETECTED NOT DETECTED Final   Parainfluenza Virus 1 NOT DETECTED NOT DETECTED Final   Parainfluenza Virus 2 NOT DETECTED NOT DETECTED Final   Parainfluenza Virus 3 NOT DETECTED NOT DETECTED Final   Parainfluenza Virus 4 NOT DETECTED NOT DETECTED Final   Respiratory Syncytial Virus NOT DETECTED NOT DETECTED Final   Bordetella pertussis NOT DETECTED NOT DETECTED Final   Bordetella Parapertussis NOT DETECTED NOT DETECTED Final   Chlamydophila pneumoniae NOT DETECTED NOT DETECTED Final   Mycoplasma pneumoniae NOT DETECTED NOT DETECTED Final    Comment: Performed at [facility], [address] [place], [place] [address]  Expectorated Sputum Assessment w Gram Stain, Rflx to Resp Cult     Status: None   Collection Time: 10/05/23  9:07 AM   Specimen: Sputum  Result Value Ref Range Status   Specimen Description SPUTUM  Final   Special Requests NONE  Final   Sputum evaluation   Final    Sputum specimen not acceptable for testing.  Please recollect.   C/KERRY NELSON AT 1005 10/05/23.PMF Performed at [facility], [address] [place], [place] [address]    Report Status 10/05/2023 FINAL  Final  Expectorated Sputum Assessment w Gram Stain, Rflx to Resp Cult     Status: None   Collection Time: 10/05/23 10:50 AM  Result Value Ref Range Status   Specimen Description EXPECTORATED SPUTUM  Final   Special Requests NONE  Final   Sputum evaluation   Final    THIS SPECIMEN IS ACCEPTABLE FOR SPUTUM CULTURE Performed at [facility], [address], [place], [place] [address]    Report Status 10/05/2023 FINAL  Final  Culture, Respiratory w Gram Stain     Status: None   Collection Time: 10/05/23 10:50 AM  Result Value Ref Range Status   Specimen Description   Final    EXPECTORATED SPUTUM Performed at [facility], [address], [place], [place] [address]    Special Requests   Final    NONE Reflexed from [id number] Performed at [facility], [address] [place], [place] [address]    Gram Stain   Final    RARE WBC SEEN RARE GRAM POSITIVE RODS RARE GRAM POSITIVE COCCI RARE VONNE  NEGATIVE RODS    Culture   Final    FEW Normal respiratory flora-no Staph aureus or Pseudomonas seen Performed at [facility], [address], [place], [place] [address]    Report Status 10/07/2023 FINAL  Final  MRSA Next Gen by PCR, Nasal     Status: None   Collection Time: 10/06/23 12:57 AM   Specimen: Nasal Mucosa; Nasal Swab  Result Value Ref Range Status   MRSA by PCR Next Gen NOT DETECTED NOT DETECTED Final    Comment: (NOTE) The GeneXpert MRSA Assay (FDA approved for NASAL specimens only), is one component of a comprehensive MRSA colonization surveillance program. It is not intended to diagnose MRSA infection nor to guide or monitor treatment for MRSA infections. Test performance is not FDA approved in patients less than 51 years old. Performed at [facility], [address] [place], [place] [address]   Culture, Respiratory w Gram Stain     Status: None   Collection Time: 10/06/23 11:37 AM   Specimen: INDUCED SPUTUM  Result Value Ref Range Status   Specimen Description   Final    INDUCED SPUTUM Performed at [facility], [address], [place], [place] [address]    Special Requests   Final    NONE Performed at [facility], [address] [place], [place] [address]    Gram Stain   Final    FEW WBC PRESENT,BOTH PMN AND MONONUCLEAR FEW GRAM POSITIVE RODS    Culture   Final    MODERATE LACTOBACILLUS FERMENTUM Standardized susceptibility testing for this organism is not available. Performed at [facility], [address], [place], [place] [address]    Report Status 10/09/2023 FINAL  Final  Culture, blood (x 2)     Status: None    Collection Time: 10/22/23  1:00 AM   Specimen: BLOOD  Result Value Ref Range Status   Specimen Description BLOOD BLOOD RIGHT ARM  Final   Special Requests   Final    BOTTLES DRAWN AEROBIC AND ANAEROBIC Blood Culture adequate volume   Culture   Final    NO GROWTH 5 DAYS Performed at [facility], [address] [place], [place] [address]    Report Status 10/27/2023 FINAL  Final  Culture, blood (x 2)     Status: None   Collection Time: 10/22/23  1:00 AM   Specimen: BLOOD  Result Value Ref Range Status   Specimen Description BLOOD BLOOD RIGHT ARM  Final   Special Requests   Final    BOTTLES DRAWN AEROBIC AND ANAEROBIC Blood Culture adequate volume   Culture   Final    NO GROWTH 5 DAYS Performed at [facility], [address] [place], [place] [address]    Report Status 10/27/2023 FINAL  Final  Resp panel by RT-PCR (RSV, Flu A&B, Covid) Anterior Nasal Swab     Status: None   Collection Time: 11/09/23  5:31 PM   Specimen: Anterior Nasal Swab  Result Value Ref Range Status   SARS Coronavirus 2 by RT PCR NEGATIVE NEGATIVE Final    Comment: (NOTE) SARS-CoV-2 target nucleic acids are NOT DETECTED.  The SARS-CoV-2 RNA is generally detectable in upper respiratory specimens during the acute phase of infection. The lowest concentration of SARS-CoV-2 viral copies this assay can detect is 138 copies/mL. A negative result does not preclude SARS-Cov-2 infection and should not be used as the sole basis for treatment or other patient management decisions.  A negative result may occur with  improper specimen collection/handling, submission of specimen other than nasopharyngeal swab, presence of viral mutation(s) within the areas targeted by this assay, and inadequate number of viral copies(<138 copies/mL). A negative result must be combined with clinical observations, patient history, and epidemiological information. The expected result is Negative.  Fact Sheet for  Patients:  BloggerCourse.com  Fact Sheet for Healthcare Providers:  SeriousBroker.it  This test is no t yet approved or cleared by the [place]  FDA and  has been authorized for detection and/or diagnosis of SARS-CoV-2 by FDA under an Emergency Use Authorization (EUA). This EUA will remain  in effect (meaning this test can be used) for the duration of the COVID-19 declaration under Section 564(b)(1) of the Act, 21 U.S.C.section 360bbb-3(b)(1), unless the authorization is terminated  or revoked sooner.       Influenza A by PCR NEGATIVE NEGATIVE Final   Influenza B by PCR NEGATIVE NEGATIVE Final    Comment: (NOTE) The Xpert Xpress SARS-CoV-2/FLU/RSV plus assay is intended as an aid in the diagnosis of influenza from Nasopharyngeal swab specimens and should not be used as a sole basis for treatment. Nasal washings and aspirates are unacceptable for Xpert Xpress SARS-CoV-2/FLU/RSV testing.  Fact Sheet for Patients: BloggerCourse.com  Fact Sheet for Healthcare Providers: SeriousBroker.it  This test is not yet approved or cleared by the [place]  FDA and has been authorized for detection and/or diagnosis of SARS-CoV-2 by FDA under an Emergency Use Authorization (EUA). This EUA will remain in effect (meaning this test can be used) for the duration of the COVID-19 declaration under Section 564(b)(1) of the Act, 21 U.S.C. section 360bbb-3(b)(1), unless the authorization is terminated or revoked.     Resp Syncytial Virus by PCR NEGATIVE NEGATIVE Final    Comment: (NOTE) Fact Sheet for Patients: BloggerCourse.com  Fact Sheet for Healthcare Providers: SeriousBroker.it  This test is not yet approved or cleared by the [place]  FDA and has been authorized for detection and/or diagnosis of SARS-CoV-2 by FDA under an Emergency  Use Authorization (EUA). This EUA will remain in effect (meaning this test can be used) for the duration of the COVID-19 declaration under Section 564(b)(1) of the Act, 21 U.S.C. section 360bbb-3(b)(1), unless the authorization is terminated or revoked.  Performed at [facility], [address] [place], [place] [address]   Respiratory (~20 pathogens) panel by PCR     Status: None   Collection Time: 11/09/23  5:31 PM   Specimen: Nasopharyngeal Swab; Respiratory  Result Value Ref Range Status   Adenovirus NOT DETECTED NOT DETECTED Final   Coronavirus 229E NOT DETECTED NOT DETECTED Final    Comment: (NOTE) The Coronavirus on the Respiratory Panel, DOES NOT test for the novel  Coronavirus (2019 nCoV)    Coronavirus HKU1 NOT DETECTED NOT DETECTED Final   Coronavirus NL63 NOT DETECTED NOT DETECTED Final   Coronavirus OC43 NOT DETECTED NOT DETECTED Final   Metapneumovirus NOT DETECTED NOT DETECTED Final   Rhinovirus / Enterovirus NOT DETECTED NOT DETECTED Final   Influenza A NOT DETECTED NOT DETECTED Final   Influenza B NOT DETECTED NOT DETECTED Final   Parainfluenza Virus 1 NOT DETECTED NOT DETECTED Final   Parainfluenza Virus 2 NOT DETECTED NOT DETECTED Final   Parainfluenza Virus 3 NOT DETECTED NOT DETECTED Final   Parainfluenza Virus 4 NOT DETECTED NOT DETECTED Final   Respiratory Syncytial Virus NOT DETECTED NOT DETECTED Final   Bordetella pertussis NOT DETECTED NOT DETECTED Final  Bordetella Parapertussis NOT DETECTED NOT DETECTED Final   Chlamydophila pneumoniae NOT DETECTED NOT DETECTED Final   Mycoplasma pneumoniae NOT DETECTED NOT DETECTED Final    Comment: Performed at [facility], [address] [place], [place] [address]  Culture, blood (Routine X 2) w Reflex to ID Panel     Status: None (Preliminary result)   Collection Time: 11/09/23  6:16 PM   Specimen: BLOOD  Result Value Ref Range Status   Specimen Description BLOOD RIGHT ANTECUBITAL  Final    Special Requests   Final    BOTTLES DRAWN AEROBIC ONLY Blood Culture adequate volume   Culture   Final    NO GROWTH 4 DAYS Performed at [facility], [address], [place], [place] [address]    Report Status PENDING  Incomplete  Culture, blood (Routine X 2) w Reflex to ID Panel     Status: None (Preliminary result)   Collection Time: 11/09/23  6:23 PM   Specimen: BLOOD  Result Value Ref Range Status   Specimen Description BLOOD BLOOD LEFT FOREARM  Final   Special Requests   Final    BOTTLES DRAWN AEROBIC AND ANAEROBIC Blood Culture adequate volume   Culture   Final    NO GROWTH 4 DAYS Performed at [facility], [address], [place], [place] [address]    Report Status PENDING  Incomplete  Urine Culture     Status: Abnormal   Collection Time: 11/10/23  1:58 AM   Specimen: Urine, Random  Result Value Ref Range Status   Specimen Description   Final    URINE, RANDOM Performed at [facility], [address], [place], [place] [address]    Special Requests   Final    NONE Reflexed from [id number] Performed at [facility], [address] [place], [place] [address]    Culture 60,000 COLONIES/mL ENTEROCOCCUS FAECALIS (A)  Final   Report Status 11/12/2023 FINAL  Final   Organism ID, Bacteria ENTEROCOCCUS FAECALIS (A)  Final      Susceptibility   Enterococcus faecalis - MIC*    AMPICILLIN  <=2 SENSITIVE Sensitive     NITROFURANTOIN <=16 SENSITIVE Sensitive     VANCOMYCIN 1 SENSITIVE Sensitive     * 60,000 COLONIES/mL ENTEROCOCCUS FAECALIS    Coagulation Studies: No results for input(s): LABPROT, INR in the last 72 hours.  Urinalysis: No results for input(s): COLORURINE, LABSPEC, PHURINE, GLUCOSEU, HGBUR, BILIRUBINUR, KETONESUR, PROTEINUR, UROBILINOGEN, NITRITE, LEUKOCYTESUR in the last 72 hours.  Invalid input(s): APPERANCEUR     Imaging: No results found.     Medications:    feeding supplement  (NEPRO CARB STEADY) 1,000 mL (11/20/23 0518)    sodium chloride    Intravenous Once   Chlorhexidine  Gluconate Cloth  6 each Topical Q0600   epoetin  alfa-epbx (RETACRIT ) injection  4,000 Units Intravenous Q M,W,F-HD   feeding supplement (PROSource TF20)  60 mL Per Tube Daily   free water   30 mL Per Tube Q4H   gentamicin  ointment   Topical TID   heparin  injection (subcutaneous)  5,000 Units Subcutaneous Q8H   insulin  aspart  10 Units Subcutaneous Once   multivitamin  1 tablet Per Tube QHS   pantoprazole  (PROTONIX ) IV  40 mg Intravenous Q12H   acetaminophen  **OR** acetaminophen , artificial tears, bisacodyl , heparin , ipratropium-albuterol , [DISCONTINUED] ondansetron  **OR** ondansetron  (ZOFRAN ) IV, mouth rinse  Assessment/ Plan:  Mr. Robert Vega is a 83 y.o.  male with obstructive sleep apnea, GERD, obesity, hypertension, dysphagia, severe esophageal  dysmotility with achalasia status post PEG tube placement, recent aspiration pneumonia acute respiratory failure status post extubation 10/09/2023, who was admitted to [facility] on 10/05/2023 for Aspiration into respiratory tract, initial encounter [T17.908A] Severe sepsis (HCC) [A41.9, R65.20] History of dysphagia [Z87.898] Fever, unspecified fever cause [R50.9] Multifocal pneumonia [J18.9]   1.  Acute kidney injury.  Baseline creatinine 0.81 from 10/14/2023.  Acute kidney injury secondary to ATN, obstructive uropathy, and progression of prerenal azotemia.   Patient is critically ill.  He was started on urgent hemodialysis for severe hyperkalemia and uremia. -Receiving dialysis today, UF 1.5 L as tolerated.  Experience multiple alarms due to malfunctioning catheter.  Patient received 1 hour Cathflo dwell.  Proceeded with treatment, no complications at this time. - Vascular surgery consulted for PermCath placement, likely tomorrow. - TOC dialysis coordinator notified of need for outpatient dialysis clinic.  Search in progress.   2.  Severe  hyperkalemia-potassium remains elevated today, 6.3.  Will dialyze on 2K bath today.  Tube feeds have been changed to Nepro.   3.  Originally admitted for sepsis due to aspiration pneumonia, found to have distal esophageal obstruction with severe dysmotility.  He is status post botulinum toxin injection to the lower esophageal sphincter.  PEG tube placed this admission.    4.Anemia in the setting of renal failure Lab Results  Component Value Date   HGB 7.5 (L) 11/20/2023  Continue Epogen  4000's IV with dialysis on MWF schedule.   5.  Bilateral hydronephrosis Noted on CT.Urology team has evaluated the patient and it is thought to be secondary to reflux.  No intervention at this time due to fragile health.     LOS: 46 Neythan Kozlov 6/25/202510:46 AM

## 2023-11-21 ENCOUNTER — Encounter
Admission: EM | Discharge status: Against Medical Advice | Payer: Self-pay | Source: Home / Self Care | Attending: Internal Medicine

## 2023-11-21 ENCOUNTER — Encounter: Payer: Self-pay | Admitting: Vascular Surgery

## 2023-11-21 DIAGNOSIS — N186 End stage renal disease: Secondary | ICD-10-CM | POA: Diagnosis not present

## 2023-11-21 DIAGNOSIS — Z992 Dependence on renal dialysis: Secondary | ICD-10-CM | POA: Diagnosis not present

## 2023-11-21 DIAGNOSIS — A419 Sepsis, unspecified organism: Secondary | ICD-10-CM | POA: Diagnosis not present

## 2023-11-21 DIAGNOSIS — N179 Acute kidney failure, unspecified: Secondary | ICD-10-CM | POA: Diagnosis not present

## 2023-11-21 DIAGNOSIS — R6520 Severe sepsis without septic shock: Secondary | ICD-10-CM | POA: Diagnosis not present

## 2023-11-21 HISTORY — PX: DIALYSIS/PERMA CATHETER INSERTION: CATH118288

## 2023-11-21 LAB — BASIC METABOLIC PANEL WITH GFR
Anion gap: 10 (ref 5–15)
BUN: 53 mg/dL — ABNORMAL HIGH (ref 8–23)
CO2: 28 mmol/L (ref 22–32)
Calcium: 7.5 mg/dL — ABNORMAL LOW (ref 8.9–10.3)
Chloride: 96 mmol/L — ABNORMAL LOW (ref 98–111)
Creatinine, Ser: 5.48 mg/dL — ABNORMAL HIGH (ref 0.61–1.24)
GFR, Estimated: 10 mL/min — ABNORMAL LOW (ref >60)
Glucose, Bld: 130 mg/dL — ABNORMAL HIGH (ref 70–99)
Potassium: 5.1 mmol/L (ref 3.5–5.1)
Sodium: 134 mmol/L — ABNORMAL LOW (ref 135–145)

## 2023-11-21 LAB — CBC
HCT: 23.5 % — ABNORMAL LOW (ref 39.0–52.0)
Hemoglobin: 7.3 g/dL — ABNORMAL LOW (ref 13.0–17.0)
MCH: 29.7 pg (ref 26.0–34.0)
MCHC: 31.1 g/dL (ref 30.0–36.0)
MCV: 95.5 fL (ref 80.0–100.0)
Platelets: 325 10*3/uL (ref 150–400)
RBC: 2.46 MIL/uL — ABNORMAL LOW (ref 4.22–5.81)
RDW: 14.7 % (ref 11.5–15.5)
WBC: 14.7 10*3/uL — ABNORMAL HIGH (ref 4.0–10.5)
nRBC: 0 % (ref 0.0–0.2)

## 2023-11-21 LAB — HEPATITIS B CORE ANTIBODY, TOTAL: HEP B CORE AB: NEGATIVE

## 2023-11-21 MED ORDER — LIDOCAINE-EPINEPHRINE (PF) 1 %-1:200000 IJ SOLN
INTRAMUSCULAR | Status: DC | PRN
  Administered 2023-11-21: 10 mL via INTRADERMAL

## 2023-11-21 MED ORDER — CEFAZOLIN SODIUM-DEXTROSE 1-4 GM/50ML-% IV SOLN
INTRAVENOUS | Status: AC
  Filled 2023-11-21: qty 50

## 2023-11-21 MED ORDER — HEPARIN (PORCINE) IN NACL 1000-0.9 UT/500ML-% IV SOLN
INTRAVENOUS | Status: DC | PRN
  Administered 2023-11-21: 500 mL

## 2023-11-21 MED ORDER — EPOETIN ALFA-EPBX 10000 UNIT/ML IJ SOLN
10000.0000 [IU] | INTRAMUSCULAR | Status: DC
  Administered 2023-11-22 – 2023-12-09 (×6): 10000 [IU] via INTRAVENOUS
  Filled 2023-11-21 (×9): qty 2

## 2023-11-21 MED ORDER — MIDAZOLAM HCL 2 MG/ML PO SYRP
8.0000 mg | ORAL_SOLUTION | Freq: Once | ORAL | Status: DC | PRN
  Filled 2023-11-21: qty 5

## 2023-11-21 MED ORDER — FAMOTIDINE 20 MG PO TABS
40.0000 mg | ORAL_TABLET | Freq: Once | ORAL | Status: DC | PRN
Start: 2023-11-21 — End: 2023-11-21

## 2023-11-21 MED ORDER — METHYLPREDNISOLONE SODIUM SUCC 125 MG IJ SOLR: 125.0000 mg | Freq: Once | INTRAMUSCULAR | Status: DC | PRN

## 2023-11-21 MED ORDER — DIPHENHYDRAMINE HCL 50 MG/ML IJ SOLN: 50.0000 mg | Freq: Once | INTRAMUSCULAR | Status: DC | PRN

## 2023-11-21 MED ORDER — MIDAZOLAM HCL 2 MG/2ML IJ SOLN
INTRAMUSCULAR | Status: DC | PRN
  Administered 2023-11-21: 2 mg via INTRAVENOUS
  Administered 2023-11-21: .5 mg via INTRAVENOUS

## 2023-11-21 MED ORDER — SODIUM CHLORIDE 0.9 % IV SOLN: INTRAVENOUS | Status: DC

## 2023-11-21 MED ORDER — MIDAZOLAM HCL 2 MG/2ML IJ SOLN
INTRAMUSCULAR | Status: AC
  Filled 2023-11-21: qty 2

## 2023-11-21 MED ORDER — CEFAZOLIN SODIUM-DEXTROSE 1-4 GM/50ML-% IV SOLN
1.0000 g | INTRAVENOUS | Status: DC
Start: 2023-11-21 — End: 2023-11-21

## 2023-11-21 SURGICAL SUPPLY — 7 items
BIOPATCH RED 1 DISK 7.0 (GAUZE/BANDAGES/DRESSINGS) IMPLANT
CATH CANNON HEMO 15FR 19 (HEMODIALYSIS SUPPLIES) IMPLANT
COVER PROBE ULTRASOUND 5X96 (MISCELLANEOUS) IMPLANT
DERMABOND ADVANCED .7 DNX12 (GAUZE/BANDAGES/DRESSINGS) IMPLANT
PACK ANGIOGRAPHY (CUSTOM PROCEDURE TRAY) ×1 IMPLANT
SUT MNCRL AB 4-0 PS2 18 (SUTURE) IMPLANT
SUT PROLENE 0 CT 1 30 (SUTURE) IMPLANT

## 2023-11-21 NOTE — Op Note (Signed)
 OPERATIVE NOTE    PRE-OPERATIVE DIAGNOSIS: 1. ESRD   POST-OPERATIVE DIAGNOSIS: same as above  PROCEDURE: Ultrasound guidance for vascular access to the right internal jugular vein Fluoroscopic guidance for placement of catheter Placement of a 19 cm tip to cuff tunneled hemodialysis catheter via the right internal jugular vein  SURGEON: Selinda Gu, MD  ANESTHESIA:  Local with Moderate conscious sedation for approximately 14 minutes using 2.5 mg of Versed    ESTIMATED BLOOD LOSS: 20 cc  FLUORO TIME: less than one minute  CONTRAST: none  FINDING(S): 1.  Patent right internal jugular vein  SPECIMEN(S):  None  INDICATIONS:   Robert Vega is a 83 y.o.male who presents with renal failure.  The patient needs long term dialysis access for their ESRD, and a Permcath is necessary.  Risks and benefits are discussed and informed consent is obtained.    DESCRIPTION: After obtaining full informed written consent, the patient was brought back to the vascular suited. The patient's right neck and chest were sterilely prepped and draped in a sterile surgical field was created. Moderate conscious sedation was administered during a face to face encounter with the patient throughout the procedure with my supervision of the RN administering medicines and monitoring the patient's vital signs, pulse oximetry, telemetry and mental status throughout from the start of the procedure until the patient was taken to the recovery room.  The right internal jugular vein was visualized with ultrasound and found to be patent. It was then accessed under direct ultrasound guidance and a permanent image was recorded. A wire was placed. After skin nick and dilatation, the peel-away sheath was placed over the wire. I then turned my attention to an area under the clavicle. Approximately 1-2 fingerbreadths below the clavicle a small counterincision was created and tunneled from the subclavicular incision to the access site.  Using fluoroscopic guidance, a 19 centimeter tip to cuff tunneled hemodialysis catheter was selected, and tunneled from the subclavicular incision to the access site. It was then placed through the peel-away sheath and the peel-away sheath was removed. Using fluoroscopic guidance the catheter tips were parked in the right atrium. The appropriate distal connectors were placed. It withdrew blood well and flushed easily with heparinized saline and a concentrated heparin  solution was then placed. It was secured to the chest wall with 2 Prolene sutures. The access incision was closed single 4-0 Monocryl. A 4-0 Monocryl pursestring suture was placed around the exit site. Sterile dressings were placed. The patient tolerated the procedure well and was taken to the recovery room in stable condition.  COMPLICATIONS: None  CONDITION: Stable  Selinda Gu, MD 11/21/2023 11:14 AM   This note was created with Dragon Medical transcription system. Any errors in dictation are purely unintentional.

## 2023-11-21 NOTE — Progress Notes (Signed)
 PROGRESS NOTE Robert Vega    DOB: Sep 08, 1940, 83 y.o.  FMW:[id number]    Code Status: Full Code   DOA: 10/05/2023   LOS: 47  Brief hospital course  Robert Vega is a 83 y.o male with significant PMH of OSA, GERD, Obesity, HTN, Dysphagia - presented to the ED 10/05/2023 from with hypoxia, fever and generalized weakness. Dx sepsis/pneumonia, concern for aspiration    Of note, EGD 03/2022 with food in upper esophagus complicated by aspiration event, cardiac arrest with round of CPR, and post resuscitation EGD with concern for lack of peristalsis. Hx prior EGD 08/2020 with note of abnormal cricopharyngeus, decrease in motility in esophagus, and spastic LES    5/10: Admit to [facility] service with sepsis due to Aspiration Pneumonia.  Course complicated by Acute Respiratory Failure due to Aspiration of vomitus w/ cardiac arrest transfer to ICU and intubation.  5/11: flexible bronchoscopy  5/13: PEG tube, +vomiting last night, failed SAT/SBT 5/14: extubated but with severe delirium 5/20:  Botulinum toxin injection into the lower esophageal sphincter by Dr. Jinny 5/21: esophagram showing diffusely distended esophagus with distal obstruction and severe dysmotility suggestive of achalasia. At that point patient was stabilized to point of pursuing SNF placement but then with complications of PEG tube placement resulted in return to OR 5/27 and tube feeds restarted 5/28 with zosyn  for peritonitis.  Had new melena 6/9 so underwent another EGD without acute bleeding seen.  Renal function declined and nephrology was consulted and without improvement with conservative management, he received dialysis access and started on HD 6/16 as this was in line with family goals of care.  Discharge will now be pending arrangement of facility and outpatient HD if he does not have renal recovery.  He remains in poor prognosis state with intermittently needing to hold tube feeds again for intolerance and needed blood  transfusion 6/18 for anemia likely related more to the renal failure as no source of bleeding has been identified thus far. Mental status is oriented to self and location and able to follow simple commands but otherwise confused. PT/OT recommends SNF. Permcath placement scheduled for this am.  Assessment & Plan  Goals of care- HCPOA paperwork received 6/25 indicating granddaughter [name] is primary and [name] is secondary. Updates to be given to only primary and they can talk amongst themselves or both be present on update calls but cannot support 63min+ calls for multiple family members daily.  - Re code status: family confirms that he is to remain full code - SNF at dc with outpatient HD as it does not appear that he is having renal recovery at this time - they want full scope of care and appear to have expectations of full recovery despite poor progress and prognosis.  - continue PT/OT, optimize for discharge to rehab as he is close to medical optimization and will need significant therapy if going to have meaningful recovery of function  AKI  Acute Renal Failure- secondary to ATN. now on HD. Had very little uop and irritation with foley so it was removed 6/19. Patients granddaughter insistent on getting urinalysis to check for UTI. Just completed course of unasyn  6/20 for possible UTI. He has minimal UOP and incontinent so clean catch is not possible and unlikely to get reliable results from catheterization which was explained to her but she still insists we check. UPDATE: nurse attempted straight cath an no urine output so unable to run urinalysis.  Hyperkalemia- persistent. Addressed with HD and lokelma  on  non-HD days, changed nutrition to low-K+ formula, nepro which has resolved today.  - bladder scan as needed if suspecting any retention - continue HD per nephrology  - Monitor UOP - will need outpatient HD set up if in line to continue with family GOC.  - patient is planned to be discharged  to [facility] and would expect to be medically ready but the time OP HD is arranged.  - BMP am  Decreased bowel sounds  Question ileus- resolved. Having regular Bms and seems to be tolerating tube feeds. - Monitor abdominal status frequently and reimage if concern arises.   Anemia 2/2 renal failure and questionable melena- Received 1 unit pRBCs 6/10, another 6/18 and hgb is stable >7. No signs of active bleed. Repeat EGD 06/10 no concerns, GI has s/o. HH has been stable. Hgb 7.9>8.1>7.5>7.3 - CBC am. Maintain hgb goal >7 - continue EPO treatments with HD.    Achalasia  Dysphagia  Aspiration risk is high- Status post botulinum toxin injection on 5/20, several EGD and SLP evals. Not safe for PO intake unless family wants to accept risk that he will 100% aspirate again and if that is the case, would recommend comfort care and then could liberalize diet for comfort.  - tube feeds as able, per RD - N.p.o. for the foreseeable future per GI - SLP evaluated, last saw on 6/12. Again reached out on 6/25 due to granddaughter insistence and recommend diet is GI driven.  - Consider outpatient referral to tertiary advanced GI for consideration of POEMS procedure. Recommend this only after a few weeks recovery period.    Severe sepsis - resolved. Initially w/ aspiration pna. Later in hospital stay w/ intra-abdominal infection secondary to PEG dislodged Acute respiratory failure with hypoxia and hypercapnia  Pneumonia- resolved - remains on 2L [place]. 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 Reversible causes addressed and had significant improvement with initiating HD but still not back to previous state. I suspect he may have had anoxic brain injury during his critical illness in ICU requiring intubation and pressors. There was no signs of acute  injury on head CT 5/16. Did show chronic microvascular ischemic disease.  Granddaughter has asked to discontinue prn antipsychotics   GERD (gastroesophageal reflux disease) Continue PPI   Essential hypertension, benign Bp wnl off meds Monitor VS     Generalized weakness - PT/OT ordered  Body mass index is 32.83 kg/m.  VTE ppx: heparin  injection 5,000 Units Start: 11/13/23 2200heparin  Diet:     Diet   Diet NPO time specified Except for: Ice Chips   Consultants: Nephrology  GI Vascular General surgery Palliative care CCM  Subjective 11/21/23    Pt reports no complaints. Denies pain. Alert today and oriented to self and location.    Objective  Blood pressure 101/70, pulse 86, temperature 97.7 F (36.5 C), temperature source Oral, resp. rate 19, height 5' 9.02 (1.753 m), weight 102.1 kg, SpO2 95%.  Intake/Output Summary (Last 24 hours) at 11/21/2023 0715 Last data filed at 11/21/2023 0513 Gross per 24 hour  Intake 1873.33 ml  Output 1100 ml  Net 773.33 ml   Filed Weights   11/19/23 0345 11/20/23 0821 11/20/23 1410  Weight: 102.6 kg 103.6 kg 100.9 kg    Physical Exam:  General: awake, alert to voice, NAD HEENT: atraumatic, MMM, hearing grossly normal  Respiratory: normal respiratory effort. Significant stertor when sleeping. O2 was not in nose upon entry to room and denies SOB.  Cardiovascular: quick capillary refill Gastrointestinal: soft, NT, ND Nervous: A&O xself and that he's in the hospital. Moving all limbs spontaneously Extremities: moves all equally, trace edema, normal tone Skin: dry, intact, normal temperature, normal color. No rashes, lesions or ulcers on exposed skin Psychiatry: calm and cooperative. able to follow commands.   Labs   I have personally reviewed the following labs and imaging studies CBC    Component Value Date/Time   WBC 14.7 (H) 11/21/2023 0358   RBC 2.46 (L) 11/21/2023 0358   HGB 7.3 (L) 11/21/2023 0358   HCT 23.5 (L)  11/21/2023 0358   PLT 325 11/21/2023 0358   MCV 95.5 11/21/2023 0358   MCH 29.7 11/21/2023 0358   MCHC 31.1 11/21/2023 0358   RDW 14.7 11/21/2023 0358   LYMPHSABS 1.6 10/31/2023 0218   MONOABS 2.3 (H) 10/31/2023 0218   EOSABS 0.1 10/31/2023 0218   BASOSABS 0.1 10/31/2023 0218      Latest Ref Rng & Units 11/21/2023    3:58 AM 11/20/2023    7:55 AM 11/19/2023   12:48 PM  BMP  Glucose 70 - 99 mg/dL 869  880  873   BUN 8 - 23 mg/dL 53  77  62   Creatinine 0.61 - 1.24 mg/dL 4.51  2.25  3.52   Sodium 135 - 145 mmol/L 134  133  133   Potassium 3.5 - 5.1 mmol/L 5.1  6.3  6.0   Chloride 98 - 111 mmol/L 96  96  95   CO2 22 - 32 mmol/L 28  25  26    Calcium  8.9 - 10.3 mg/dL 7.5  7.3  7.2    No results found.  Disposition Plan & Communication  Patient status: Inpatient  Admitted From: Home Planned disposition location: SNF Anticipated discharge date: TBD pending renal function, HD set up OP  Family Communication: dranddaughter on phone   Author: Marien LITTIE Piety, DO [facility] 11/21/2023, 7:15 AM   Available by Epic secure chat 7AM-7PM. If 7PM-7AM, please contact night-coverage.  [facility] contact information found on [email or web address].

## 2023-11-21 NOTE — TOC Progression Note (Signed)
 Transition of Care [facility]) - Progression Note    Patient Details  Name: Robert Vega MRN: [id number] Date of Birth: Oct 09, 1940  Transition of Care [facility]) CM/SW Contact  Lauraine [name], LCSW Phone Number: 11/21/2023, 3:47 PM  Clinical Narrative:   Updated [facility] liaison.  Expected Discharge Plan and Services                                               Social Determinants of Health (SDOH) Interventions SDOH Screenings   Food Insecurity: Patient Unable To Answer (10/06/2023)  Recent Concern: Food Insecurity - Food Insecurity Present (09/11/2023)   Received from [facility]  Housing: Patient Unable To Answer (10/06/2023)  Recent Concern: Housing - High Risk (09/11/2023)   Received from [facility]  Transportation Needs: Patient Unable To Answer (10/06/2023)  Utilities: Patient Unable To Answer (10/06/2023)  Depression (PHQ2-9): Low Risk  (05/07/2023)  Financial Resource Strain: Medium Risk (09/11/2023)   Received from [facility]  Social Connections: Unknown (10/06/2023)  Tobacco Use: Medium Risk (11/05/2023)    Readmission Risk Interventions     No data to display

## 2023-11-21 NOTE — Progress Notes (Signed)
 [facility]  ROUNDING NOTE   Subjective:   Patient seen resting in bed Currently n.p.o. for PermCath placement later this morning Alert and oriented to self Supplemental oxygen currently at high level. Trace lower extremity edema   Objective:  Vital signs in last 24 hours:  Temp:  [97.4 F (36.3 C)-99.4 F (37.4 C)] 97.4 F (36.3 C) (06/26 1047) Pulse Rate:  [75-92] 80 (06/26 1047) Resp:  [13-29] 20 (06/26 0737) BP: (98-140)/(50-84) 129/84 (06/26 1047) SpO2:  [93 %-98 %] 95 % (06/26 1047) Weight:  [100.9 kg] 100.9 kg (06/25 1410)  Weight change:  Filed Weights   11/19/23 0345 11/20/23 0821 11/20/23 1410  Weight: 102.6 kg 103.6 kg 100.9 kg    Intake/Output: I/O last 3 completed shifts: In: 1933.3 [Other:60; NG/GT:1873.3] Out: 1100 [Other:1100]   Intake/Output this shift:  No intake/output data recorded.  Physical Exam: General: Chronically ill-appearing  Head: Oral mucosa moist  Eyes: Anicteric  Lungs:  Scattered rhonchi  Heart: Regular rate and rhythm  Abdomen:  Peg tube in place   Extremities: Trace peripheral edema.  Neurologic: Alert and oriented to self  Skin: No lesions  Access: Femoral dialysis catheter    Basic Metabolic Panel: Recent Labs  Lab 11/15/23 0900 11/18/23 0800 11/19/23 0412 11/19/23 1248 11/20/23 0755 11/21/23 0358  NA 134* 134* 135 133* 133* 134*  K 4.6 6.3* 6.1* 6.0* 6.3* 5.1  CL 97* 97* 98 95* 96* 96*  CO2 26 26 28 26 25 28   GLUCOSE 131* 138* 133* 126* 119* 130*  BUN 69* 77* 54* 62* 77* 53*  CREATININE 6.92* 8.39* 5.87* 6.47* 7.74* 5.48*  CALCIUM  6.8* 7.0* 7.0* 7.2* 7.3* 7.5*  PHOS 7.6* 8.4* 6.6*  --  7.9*  --     Liver Function Tests: Recent Labs  Lab 11/15/23 0900 11/18/23 0800 11/19/23 0412 11/20/23 0755  ALBUMIN 1.7* 1.6* 1.6* 1.6*   No results for input(s): LIPASE, AMYLASE in the last 168 hours. No results for input(s): AMMONIA in the last 168 hours.  CBC: Recent Labs  Lab  11/15/23 0900 11/18/23 0800 11/20/23 0432 11/21/23 0358  WBC 13.9* 18.5* 16.1* 14.7*  HGB 7.9* 8.1* 7.5* 7.3*  HCT 23.9* 24.9* 24.0* 23.5*  MCV 91.9 93.3 93.4 95.5  PLT 304 386 370 325    Cardiac Enzymes: No results for input(s): CKTOTAL, CKMB, CKMBINDEX, TROPONINI in the last 168 hours.  BNP: Invalid input(s): POCBNP  CBG: Recent Labs  Lab 11/19/23 1138 11/19/23 1532 11/20/23 0700 11/20/23 0745 11/20/23 1641  GLUCAP 122* 123* 116* 132* 126*    Microbiology: Results for orders placed or performed during the hospital encounter of 10/05/23  Blood Culture (routine x 2)     Status: None   Collection Time: 10/05/23  5:06 AM   Specimen: BLOOD  Result Value Ref Range Status   Specimen Description BLOOD LA  Final   Special Requests   Final    BOTTLES DRAWN AEROBIC AND ANAEROBIC Blood Culture results may not be optimal due to an inadequate volume of blood received in culture bottles   Culture   Final    NO GROWTH 5 DAYS Performed at [facility], [address] [place], [place] [address]    Report Status 10/10/2023 FINAL  Final  Blood Culture (routine x 2)     Status: None   Collection Time: 10/05/23  5:07 AM   Specimen: BLOOD  Result Value Ref Range Status   Specimen Description BLOOD RA  Final   Special  Requests   Final    BOTTLES DRAWN AEROBIC AND ANAEROBIC Blood Culture results may not be optimal due to an inadequate volume of blood received in culture bottles   Culture   Final    NO GROWTH 5 DAYS Performed at [facility], [address] [place], [place] [address]    Report Status 10/10/2023 FINAL  Final  Resp panel by RT-PCR (RSV, Flu A&B, Covid) Anterior Nasal Swab     Status: None   Collection Time: 10/05/23  5:56 AM   Specimen: Anterior Nasal Swab  Result Value Ref Range Status   SARS Coronavirus 2 by RT PCR NEGATIVE NEGATIVE Final    Comment: (NOTE) SARS-CoV-2 target nucleic acids are NOT DETECTED.  The SARS-CoV-2 RNA is  generally detectable in upper respiratory specimens during the acute phase of infection. The lowest concentration of SARS-CoV-2 viral copies this assay can detect is 138 copies/mL. A negative result does not preclude SARS-Cov-2 infection and should not be used as the sole basis for treatment or other patient management decisions. A negative result may occur with  improper specimen collection/handling, submission of specimen other than nasopharyngeal swab, presence of viral mutation(s) within the areas targeted by this assay, and inadequate number of viral copies(<138 copies/mL). A negative result must be combined with clinical observations, patient history, and epidemiological information. The expected result is Negative.  Fact Sheet for Patients:  BloggerCourse.com  Fact Sheet for Healthcare Providers:  SeriousBroker.it  This test is no t yet approved or cleared by the [place]  FDA and  has been authorized for detection and/or diagnosis of SARS-CoV-2 by FDA under an Emergency Use Authorization (EUA). This EUA will remain  in effect (meaning this test can be used) for the duration of the COVID-19 declaration under Section 564(b)(1) of the Act, 21 U.S.C.section 360bbb-3(b)(1), unless the authorization is terminated  or revoked sooner.       Influenza A by PCR NEGATIVE NEGATIVE Final   Influenza B by PCR NEGATIVE NEGATIVE Final    Comment: (NOTE) The Xpert Xpress SARS-CoV-2/FLU/RSV plus assay is intended as an aid in the diagnosis of influenza from Nasopharyngeal swab specimens and should not be used as a sole basis for treatment. Nasal washings and aspirates are unacceptable for Xpert Xpress SARS-CoV-2/FLU/RSV testing.  Fact Sheet for Patients: BloggerCourse.com  Fact Sheet for Healthcare Providers: SeriousBroker.it  This test is not yet approved or cleared by the [place] FDA and has been authorized for detection and/or diagnosis of SARS-CoV-2 by FDA under an Emergency Use Authorization (EUA). This EUA will remain in effect (meaning this test can be used) for the duration of the COVID-19 declaration under Section 564(b)(1) of the Act, 21 U.S.C. section 360bbb-3(b)(1), unless the authorization is terminated or revoked.     Resp Syncytial Virus by PCR NEGATIVE NEGATIVE Final    Comment: (NOTE) Fact Sheet for Patients: BloggerCourse.com  Fact Sheet for Healthcare Providers: SeriousBroker.it  This test is not yet approved or cleared by the [place]  FDA and has been authorized for detection and/or diagnosis of SARS-CoV-2 by FDA under an Emergency Use Authorization (EUA). This EUA will remain in effect (meaning this test can be used) for the duration of the COVID-19 declaration under Section 564(b)(1) of the Act, 21 U.S.C. section 360bbb-3(b)(1), unless the authorization is terminated or revoked.  Performed at [facility], [address] [place], [place] [address]   Respiratory (~20 pathogens) panel by PCR     Status: None   Collection  Time: 10/05/23  8:11 AM   Specimen: Nasopharyngeal Swab; Respiratory  Result Value Ref Range Status   Adenovirus NOT DETECTED NOT DETECTED Final   Coronavirus 229E NOT DETECTED NOT DETECTED Final    Comment: (NOTE) The Coronavirus on the Respiratory Panel, DOES NOT test for the novel  Coronavirus (2019 nCoV)    Coronavirus HKU1 NOT DETECTED NOT DETECTED Final   Coronavirus NL63 NOT DETECTED NOT DETECTED Final   Coronavirus OC43 NOT DETECTED NOT DETECTED Final   Metapneumovirus NOT DETECTED NOT DETECTED Final   Rhinovirus / Enterovirus NOT DETECTED NOT DETECTED Final   Influenza A NOT DETECTED NOT DETECTED Final   Influenza B NOT DETECTED NOT DETECTED Final   Parainfluenza Virus 1 NOT DETECTED NOT DETECTED Final   Parainfluenza Virus 2 NOT  DETECTED NOT DETECTED Final   Parainfluenza Virus 3 NOT DETECTED NOT DETECTED Final   Parainfluenza Virus 4 NOT DETECTED NOT DETECTED Final   Respiratory Syncytial Virus NOT DETECTED NOT DETECTED Final   Bordetella pertussis NOT DETECTED NOT DETECTED Final   Bordetella Parapertussis NOT DETECTED NOT DETECTED Final   Chlamydophila pneumoniae NOT DETECTED NOT DETECTED Final   Mycoplasma pneumoniae NOT DETECTED NOT DETECTED Final    Comment: Performed at [facility], [address], [place], [place] [address]  Expectorated Sputum Assessment w Gram Stain, Rflx to Resp Cult     Status: None   Collection Time: 10/05/23  9:07 AM   Specimen: Sputum  Result Value Ref Range Status   Specimen Description SPUTUM  Final   Special Requests NONE  Final   Sputum evaluation   Final    Sputum specimen not acceptable for testing.  Please recollect.   C/KERRY NELSON AT 1005 10/05/23.PMF Performed at [facility], [address] [place], [place] [address]    Report Status 10/05/2023 FINAL  Final  Expectorated Sputum Assessment w Gram Stain, Rflx to Resp Cult     Status: None   Collection Time: 10/05/23 10:50 AM  Result Value Ref Range Status   Specimen Description EXPECTORATED SPUTUM  Final   Special Requests NONE  Final   Sputum evaluation   Final    THIS SPECIMEN IS ACCEPTABLE FOR SPUTUM CULTURE Performed at [facility], [address], [place], [place] [address]    Report Status 10/05/2023 FINAL  Final  Culture, Respiratory w Gram Stain     Status: None   Collection Time: 10/05/23 10:50 AM  Result Value Ref Range Status   Specimen Description   Final    EXPECTORATED SPUTUM Performed at [facility], [address], [place], [place] [address]    Special Requests   Final    NONE Reflexed from [phone] [phone] Performed at [facility], [address] [place], [place] [address]    Gram Stain   Final    RARE WBC SEEN RARE VONNE POSITIVE RODS RARE VONNE  POSITIVE COCCI RARE GRAM NEGATIVE RODS    Culture   Final    FEW Normal respiratory flora-no Staph aureus or Pseudomonas seen Performed at [facility], [address] [place], [place] [address]    Report Status 10/07/2023 FINAL  Final  MRSA Next Gen by PCR, Nasal     Status: None   Collection Time: 10/06/23 12:57 AM   Specimen: Nasal Mucosa; Nasal Swab  Result Value Ref Range Status   MRSA by PCR Next Gen NOT DETECTED NOT DETECTED Final    Comment: (NOTE) The GeneXpert MRSA Assay (  FDA approved for NASAL specimens only), is one component of a comprehensive MRSA colonization surveillance program. It is not intended to diagnose MRSA infection nor to guide or monitor treatment for MRSA infections. Test performance is not FDA approved in patients less than 61 years old. Performed at [facility], [address] [place], [place] [address]   Culture, Respiratory w Gram Stain     Status: None   Collection Time: 10/06/23 11:37 AM   Specimen: INDUCED SPUTUM  Result Value Ref Range Status   Specimen Description   Final    INDUCED SPUTUM Performed at [facility], [address], [place], [place] [address]    Special Requests   Final    NONE Performed at [facility], [address] [place], [place] [address]    Gram Stain   Final    FEW WBC PRESENT,BOTH PMN AND MONONUCLEAR FEW GRAM POSITIVE RODS    Culture   Final    MODERATE LACTOBACILLUS FERMENTUM Standardized susceptibility testing for this organism is not available. Performed at [facility], [address] [place], [place] [address]    Report Status 10/09/2023 FINAL  Final  Culture, blood (x 2)     Status: None   Collection Time: 10/22/23  1:00 AM   Specimen: BLOOD  Result Value Ref Range Status   Specimen Description BLOOD BLOOD RIGHT ARM  Final   Special Requests   Final    BOTTLES DRAWN AEROBIC AND ANAEROBIC Blood Culture adequate volume   Culture   Final    NO GROWTH 5  DAYS Performed at [facility], [address] [place], [place] [address]    Report Status 10/27/2023 FINAL  Final  Culture, blood (x 2)     Status: None   Collection Time: 10/22/23  1:00 AM   Specimen: BLOOD  Result Value Ref Range Status   Specimen Description BLOOD BLOOD RIGHT ARM  Final   Special Requests   Final    BOTTLES DRAWN AEROBIC AND ANAEROBIC Blood Culture adequate volume   Culture   Final    NO GROWTH 5 DAYS Performed at [facility], [address] [place], [place] [address]    Report Status 10/27/2023 FINAL  Final  Resp panel by RT-PCR (RSV, Flu A&B, Covid) Anterior Nasal Swab     Status: None   Collection Time: 11/09/23  5:31 PM   Specimen: Anterior Nasal Swab  Result Value Ref Range Status   SARS Coronavirus 2 by RT PCR NEGATIVE NEGATIVE Final    Comment: (NOTE) SARS-CoV-2 target nucleic acids are NOT DETECTED.  The SARS-CoV-2 RNA is generally detectable in upper respiratory specimens during the acute phase of infection. The lowest concentration of SARS-CoV-2 viral copies this assay can detect is 138 copies/mL. A negative result does not preclude SARS-Cov-2 infection and should not be used as the sole basis for treatment or other patient management decisions. A negative result may occur with  improper specimen collection/handling, submission of specimen other than nasopharyngeal swab, presence of viral mutation(s) within the areas targeted by this assay, and inadequate number of viral copies(<138 copies/mL). A negative result must be combined with clinical observations, patient history, and epidemiological information. The expected result is Negative.  Fact Sheet for Patients:  BloggerCourse.com  Fact Sheet for Healthcare Providers:  SeriousBroker.it  This test is no t yet approved or cleared by the [place]  FDA and  has been authorized for detection and/or diagnosis of SARS-CoV-2  by  FDA under an Emergency Use Authorization (EUA). This EUA will remain  in effect (meaning this test can be used) for the duration of the COVID-19 declaration under Section 564(b)(1) of the Act, 21 U.S.C.section 360bbb-3(b)(1), unless the authorization is terminated  or revoked sooner.       Influenza A by PCR NEGATIVE NEGATIVE Final   Influenza B by PCR NEGATIVE NEGATIVE Final    Comment: (NOTE) The Xpert Xpress SARS-CoV-2/FLU/RSV plus assay is intended as an aid in the diagnosis of influenza from Nasopharyngeal swab specimens and should not be used as a sole basis for treatment. Nasal washings and aspirates are unacceptable for Xpert Xpress SARS-CoV-2/FLU/RSV testing.  Fact Sheet for Patients: BloggerCourse.com  Fact Sheet for Healthcare Providers: SeriousBroker.it  This test is not yet approved or cleared by the [place]  FDA and has been authorized for detection and/or diagnosis of SARS-CoV-2 by FDA under an Emergency Use Authorization (EUA). This EUA will remain in effect (meaning this test can be used) for the duration of the COVID-19 declaration under Section 564(b)(1) of the Act, 21 U.S.C. section 360bbb-3(b)(1), unless the authorization is terminated or revoked.     Resp Syncytial Virus by PCR NEGATIVE NEGATIVE Final    Comment: (NOTE) Fact Sheet for Patients: BloggerCourse.com  Fact Sheet for Healthcare Providers: SeriousBroker.it  This test is not yet approved or cleared by the [place]  FDA and has been authorized for detection and/or diagnosis of SARS-CoV-2 by FDA under an Emergency Use Authorization (EUA). This EUA will remain in effect (meaning this test can be used) for the duration of the COVID-19 declaration under Section 564(b)(1) of the Act, 21 U.S.C. section 360bbb-3(b)(1), unless the authorization is terminated or revoked.  Performed at  [facility], [address] [place], [place] [address]   Respiratory (~20 pathogens) panel by PCR     Status: None   Collection Time: 11/09/23  5:31 PM   Specimen: Nasopharyngeal Swab; Respiratory  Result Value Ref Range Status   Adenovirus NOT DETECTED NOT DETECTED Final   Coronavirus 229E NOT DETECTED NOT DETECTED Final    Comment: (NOTE) The Coronavirus on the Respiratory Panel, DOES NOT test for the novel  Coronavirus (2019 nCoV)    Coronavirus HKU1 NOT DETECTED NOT DETECTED Final   Coronavirus NL63 NOT DETECTED NOT DETECTED Final   Coronavirus OC43 NOT DETECTED NOT DETECTED Final   Metapneumovirus NOT DETECTED NOT DETECTED Final   Rhinovirus / Enterovirus NOT DETECTED NOT DETECTED Final   Influenza A NOT DETECTED NOT DETECTED Final   Influenza B NOT DETECTED NOT DETECTED Final   Parainfluenza Virus 1 NOT DETECTED NOT DETECTED Final   Parainfluenza Virus 2 NOT DETECTED NOT DETECTED Final   Parainfluenza Virus 3 NOT DETECTED NOT DETECTED Final   Parainfluenza Virus 4 NOT DETECTED NOT DETECTED Final   Respiratory Syncytial Virus NOT DETECTED NOT DETECTED Final   Bordetella pertussis NOT DETECTED NOT DETECTED Final   Bordetella Parapertussis NOT DETECTED NOT DETECTED Final   Chlamydophila pneumoniae NOT DETECTED NOT DETECTED Final   Mycoplasma pneumoniae NOT DETECTED NOT DETECTED Final    Comment: Performed at [facility], [address], [place], [place] [address]  Culture, blood (Routine X 2) w Reflex to ID Panel     Status: None   Collection Time: 11/09/23  6:16 PM   Specimen: BLOOD  Result Value Ref Range Status   Specimen Description   Final    BLOOD RIGHT ANTECUBITAL Performed at [facility], [address], [place], [place] [address]    Special Requests   Final    BOTTLES DRAWN AEROBIC ONLY Blood Culture adequate volume Performed at [facility], [address], [place], [place] [address]    Culture   Final    NO GROWTH 11  DAYS Performed at [facility], [address], [place], [place] [address]    Report Status 11/20/2023 FINAL  Final  Culture, blood (Routine X 2) w Reflex to ID Panel     Status: None   Collection Time: 11/09/23  6:23 PM   Specimen: BLOOD  Result Value Ref Range Status   Specimen Description   Final    BLOOD BLOOD LEFT FOREARM Performed at [facility], [address], [place], [place] [address]    Special Requests   Final    BOTTLES DRAWN AEROBIC AND ANAEROBIC Blood Culture adequate volume Performed at [facility], [address] [place], [place] [address]    Culture   Final    NO GROWTH 11 DAYS Performed at [facility], [address], [place], [place] [address]    Report Status 11/20/2023 FINAL  Final  Urine Culture     Status: Abnormal   Collection Time: 11/10/23  1:58 AM   Specimen: Urine, Random  Result Value Ref Range Status   Specimen Description   Final    URINE, RANDOM Performed at [facility], [address] [place], [place] [address]    Special Requests   Final    NONE Reflexed from [id number] Performed at [facility], [address] [place], [place] [address]    Culture 60,000 COLONIES/mL ENTEROCOCCUS FAECALIS (A)  Final   Report Status 11/12/2023 FINAL  Final   Organism ID, Bacteria ENTEROCOCCUS FAECALIS (A)  Final      Susceptibility   Enterococcus faecalis - MIC*    AMPICILLIN  <=2 SENSITIVE Sensitive     NITROFURANTOIN <=16 SENSITIVE Sensitive     VANCOMYCIN 1 SENSITIVE Sensitive     * 60,000 COLONIES/mL ENTEROCOCCUS FAECALIS    Coagulation Studies: No results for input(s): LABPROT, INR in the last 72 hours.  Urinalysis: No results for input(s): COLORURINE, LABSPEC, PHURINE, GLUCOSEU, HGBUR, BILIRUBINUR, KETONESUR, PROTEINUR, UROBILINOGEN, NITRITE, LEUKOCYTESUR in the last 72 hours.  Invalid input(s): APPERANCEUR     Imaging: No results found.     Medications:     sodium chloride  10 mL/hr at 11/21/23 1049    ceFAZolin (ANCEF) IV     feeding supplement (NEPRO CARB STEADY) 1,000 mL (11/20/23 0518)    [MAR Hold] sodium chloride    Intravenous Once   [MAR Hold] Chlorhexidine  Gluconate Cloth  6 each Topical Q0600   [MAR Hold] epoetin  alfa-epbx (RETACRIT ) injection  4,000 Units Intravenous Q M,W,F-HD   [MAR Hold] feeding supplement (PROSource TF20)  60 mL Per Tube Daily   [MAR Hold] free water   30 mL Per Tube Q4H   [MAR Hold] gentamicin  ointment   Topical TID   [MAR Hold] heparin  injection (subcutaneous)  5,000 Units Subcutaneous Q8H   [MAR Hold] insulin  aspart  10 Units Subcutaneous Once   [MAR Hold] multivitamin  1 tablet Per Tube QHS   [MAR Hold] pantoprazole  (PROTONIX ) IV  40 mg Intravenous Q12H   [MAR Hold] acetaminophen  **OR** [MAR Hold] acetaminophen , [MAR Hold] artificial tears, [MAR Hold] bisacodyl , diphenhydrAMINE , famotidine , Heparin  (Porcine) in NaCl, [MAR Hold] ipratropium-albuterol , lidocaine -EPINEPHrine (PF), methylPREDNISolone  (SOLU-MEDROL ) injection, midazolam , midazolam , [DISCONTINUED] ondansetron  **OR** [MAR Hold] ondansetron  (ZOFRAN ) IV, [MAR Hold]  mouth rinse  Assessment/ Plan:  Mr. Robert Vega is a 83 y.o.  male with obstructive sleep apnea, GERD, obesity, hypertension, dysphagia, severe esophageal dysmotility with achalasia status post PEG tube placement, recent aspiration pneumonia acute respiratory failure status post extubation 10/09/2023, who was admitted to [facility] on 10/05/2023 for Aspiration into respiratory tract, initial encounter [T17.908A] Severe sepsis (HCC) [A41.9, R65.20] History of dysphagia [Z87.898] Fever, unspecified fever cause [R50.9] Multifocal pneumonia [J18.9]   1.  Acute kidney injury.  Baseline creatinine 0.81 from 10/14/2023.  Acute kidney injury secondary to ATN, obstructive uropathy, and progression of prerenal azotemia.   Patient is critically ill.  He was started on urgent hemodialysis for severe  hyperkalemia and uremia. - Patient received dialysis yesterday, UF 1.1 L achieved. - Vascular surgery to place PermCath today.  Once placed will remove HD temp cath. -Next dialysis treatment scheduled for Friday, preferably with patient seated in chair. - TOC dialysis coordinator notified of need for outpatient dialysis clinic.  Search in progress.   2.  Severe hyperkalemia-potassium has improved to 5.1 today.  Will determine a.m. and correct with dialysis if needed   3.  Originally admitted for sepsis due to aspiration pneumonia, found to have distal esophageal obstruction with severe dysmotility.  He is status post botulinum toxin injection to the lower esophageal sphincter.  PEG tube placed this admission.    4.Anemia in the setting of renal failure Lab Results  Component Value Date   HGB 7.3 (L) 11/21/2023  Hemoglobin remains decreased, will increase to Epogen  10,000 units IV with dialysis.   5.  Bilateral hydronephrosis Noted on CT.Urology team has evaluated the patient and it is thought to be secondary to reflux.  No intervention at this time due to fragile health.     LOS: 47 Annarae Macnair 6/26/202511:05 AM

## 2023-11-21 NOTE — Progress Notes (Signed)
 Physical Therapy Treatment Patient Details Name: Robert Vega MRN: [id number] DOB: 1940-10-27 Today's Date: 11/21/2023   History of Present Illness 83 y.o male with significant PMH of OSA, GERD, Obesity, HTN, Dysphagia: EGD 03/2022 with food in upper esophagus complicated by aspiration event, cardiac arrest with round of CPR, and post resuscitation EGD with concern for lack of peristalsis. Pt presented to ED on 10/05/2023 with hypoxia, fever and generalized weakness; developed acute respiratory failure requiring intubation 5/10 due to aspiration pneumonia, extubated 5/15, but had significant agitation required brief course of Precedex ; pt now s/p exploratory laparotomy with closure of gastrotomy and insertion of gastrostomy tube on 10/22/23.  PT order discontinued by MD 11/10/23 and new PT consult received 11/14/23.  Pt s/p R temporary femoral vein dialysis catheter placement 11/11/23.    PT Comments  Pt was long sitting in bed with RN present upon arrival. He is much more alert than observed previous date. Only oriented to self though. Poor processing + increased time required due to cognition deficits. Pt does remain cooperative but extremely limited by fatigue. Pt tends to get SOB with minimal activity. Max assist to exit bed due too poor motor planning. Once seated EOB, pt required various amount of assist to prevent posterior LOB back into bed. Session did include pt standing 3 x ~1-2 minutes each. Constant vcs in standing for posture correction. Unsafe to advance away from EOB due to pt's limited activity tolerance/SOB. He did take a few labored steps along EOB but endorsed fatigue and required seated rest.  Pt remains far from his baseline and will continue to benefit from skilled PT to maximize his independence while deceasing caregiver burden.    If plan is discharge home, recommend the following: A lot of help with walking and/or transfers;A lot of help with bathing/dressing/bathroom;Assistance  with cooking/housework;Assistance with feeding;Direct supervision/assist for medications management;Direct supervision/assist for financial management;Assist for transportation;Help with stairs or ramp for entrance;Supervision due to cognitive status     Equipment Recommendations  Other (comment) (Defer to next level of care)       Precautions / Restrictions Precautions Precautions: Fall Recall of Precautions/Restrictions: Impaired Precaution/Restrictions Comments: PEG tube; R temporary fem catheter precautions (awaiting consent to have perm cath) Restrictions Weight Bearing Restrictions Per Provider Order: No     Mobility  Bed Mobility Overal bed mobility: Needs Assistance Bed Mobility: Rolling, Supine to Sit, Sit to Supine  Supine to sit: Max assist Sit to supine: Max assist, Total assist        Transfers Overall transfer level: Needs assistance Equipment used: Rolling walker (2 wheels) Transfers: Sit to/from Stand Sit to Stand: Mod assist, From elevated surface  General transfer comment: pt stood 3 x EOB. vcs for posture correction, technique, and sequencing. Pt has poor activity tolerance and gets SOB with minimal standing EOB activity. Did take steps along EOB however pt required seated rest after only a few steps    Ambulation/Gait  General Gait Details: pt was able to take steps along EOB however due to fatigue with minimal activity, elected not to progress away form EOB. will need chair follow for future gait attempts    Balance Overall balance assessment: Needs assistance Sitting-balance support: Feet supported, Bilateral upper extremity supported Sitting balance-Leahy Scale: Poor Sitting balance - Comments: pt has several posterior LOB while seated EOB   Standing balance support: Bilateral upper extremity supported, During functional activity, Reliant on assistive device for balance Standing balance-Leahy Scale: Poor Standing balance comment: Pt is unsteady on  his  feet and required constant assistance to prevent LOB in standing with max vcs for posture correction       Communication Communication Communication: Impaired Factors Affecting Communication: Reduced clarity of speech  Cognition Arousal: Alert Behavior During Therapy: Flat affect   PT - Cognitive impairments: History of cognitive impairments   Orientation impairments: Place, Time, Situation    PT - Cognition Comments: pt is only truely oriented to self. Increased time for processing and performing desired task requested of him Following commands: Impaired Following commands impaired: Follows one step commands inconsistently, Follows one step commands with increased time    Cueing Cueing Techniques: Verbal cues, Tactile cues, Visual cues, Gestural cues         Pertinent Vitals/Pain Pain Assessment Pain Assessment: No/denies pain     PT Goals (current goals can now be found in the care plan section) Acute Rehab PT Goals Patient Stated Goal: none stated Progress towards PT goals: Not progressing toward goals - comment    Frequency    Min 1X/week           Co-evaluation     PT goals addressed during session: Mobility/safety with mobility;Balance;Proper use of DME;Strengthening/ROM        AM-PAC PT 6 Clicks Mobility   Outcome Measure  Help needed turning from your back to your side while in a flat bed without using bedrails?: A Lot Help needed moving from lying on your back to sitting on the side of a flat bed without using bedrails?: A Lot Help needed moving to and from a bed to a chair (including a wheelchair)?: A Lot Help needed standing up from a chair using your arms (e.g., wheelchair or bedside chair)?: A Lot Help needed to walk in hospital room?: Total Help needed climbing 3-5 steps with a railing? : Total 6 Click Score: 10    End of Session   Activity Tolerance: Patient tolerated treatment well;Patient limited by fatigue Patient left: in bed;with  call bell/phone within reach;with bed alarm set;with nursing/sitter in room;Other (comment) Nurse Communication: Mobility status;Precautions;Other (comment) PT Visit Diagnosis: Other abnormalities of gait and mobility (R26.89);Muscle weakness (generalized) (M62.81)     Time: 9272-9248 PT Time Calculation (min) (ACUTE ONLY): 24 min  Charges:    $Therapeutic Activity: 23-37 mins PT General Charges $$ ACUTE PT VISIT: 1 Visit                     Rankin Essex PTA 11/21/23, 9:58 AM

## 2023-11-21 NOTE — Plan of Care (Signed)
 Problem: Education: Goal: Knowledge of General Education information will improve Description: Including pain rating scale, medication(s)/side effects and non-pharmacologic comfort measures Outcome: Progressing   Problem: Health Behavior/Discharge Planning: Goal: Ability to manage health-related needs will improve Outcome: Progressing   Problem: Clinical Measurements: Goal: Ability to maintain clinical measurements within normal limits will improve Outcome: Progressing Goal: Will remain free from infection Outcome: Progressing Goal: Diagnostic test results will improve Outcome: Progressing Goal: Respiratory complications will improve Outcome: Progressing Goal: Cardiovascular complication will be avoided Outcome: Progressing   Problem: Activity: Goal: Risk for activity intolerance will decrease Outcome: Progressing   Problem: Nutrition: Goal: Adequate nutrition will be maintained Outcome: Progressing   Problem: Coping: Goal: Level of anxiety will decrease Outcome: Progressing   Problem: Elimination: Goal: Will not experience complications related to bowel motility Outcome: Progressing Goal: Will not experience complications related to urinary retention Outcome: Progressing   Problem: Pain Managment: Goal: General experience of comfort will improve and/or be controlled Outcome: Progressing   Problem: Safety: Goal: Ability to remain free from injury will improve Outcome: Progressing   Problem: Skin Integrity: Goal: Risk for impaired skin integrity will decrease Outcome: Progressing   Problem: Activity: Goal: Ability to tolerate increased activity will improve Outcome: Progressing   Problem: Clinical Measurements: Goal: Ability to maintain a body temperature in the normal range will improve Outcome: Progressing   Problem: Respiratory: Goal: Ability to maintain adequate ventilation will improve Outcome: Progressing Goal: Ability to maintain a clear airway  will improve Outcome: Progressing   Problem: Education: Goal: Ability to describe self-care measures that may prevent or decrease complications (Diabetes Survival Skills Education) will improve Outcome: Progressing Goal: Individualized Educational Video(s) Outcome: Progressing   Problem: Coping: Goal: Ability to adjust to condition or change in health will improve Outcome: Progressing   Problem: Fluid Volume: Goal: Ability to maintain a balanced intake and output will improve Outcome: Progressing   Problem: Health Behavior/Discharge Planning: Goal: Ability to identify and utilize available resources and services will improve Outcome: Progressing Goal: Ability to manage health-related needs will improve Outcome: Progressing   Problem: Metabolic: Goal: Ability to maintain appropriate glucose levels will improve Outcome: Progressing   Problem: Nutritional: Goal: Maintenance of adequate nutrition will improve Outcome: Progressing Goal: Progress toward achieving an optimal weight will improve Outcome: Progressing   Problem: Skin Integrity: Goal: Risk for impaired skin integrity will decrease Outcome: Progressing   Problem: Tissue Perfusion: Goal: Adequacy of tissue perfusion will improve Outcome: Progressing   Problem: Activity: Goal: Ability to tolerate increased activity will improve Outcome: Progressing   Problem: Respiratory: Goal: Ability to maintain a clear airway and adequate ventilation will improve Outcome: Progressing   Problem: Role Relationship: Goal: Method of communication will improve Outcome: Progressing   Problem: Fluid Volume: Goal: Hemodynamic stability will improve Outcome: Progressing   Problem: Clinical Measurements: Goal: Diagnostic test results will improve Outcome: Progressing Goal: Signs and symptoms of infection will decrease Outcome: Progressing   Problem: Respiratory: Goal: Ability to maintain adequate ventilation will  improve Outcome: Progressing   Problem: Education: Goal: Knowledge of disease and its progression will improve Outcome: Progressing   Problem: Health Behavior/Discharge Planning: Goal: Ability to manage health-related needs will improve Outcome: Progressing   Problem: Clinical Measurements: Goal: Complications related to the disease process or treatment will be avoided or minimized Outcome: Progressing Goal: Dialysis access will remain free of complications Outcome: Progressing   Problem: Activity: Goal: Activity intolerance will improve Outcome: Progressing   Problem: Fluid Volume: Goal: Fluid volume balance will  be maintained or improved Outcome: Progressing

## 2023-11-21 NOTE — Progress Notes (Signed)
 OT Cancellation Note  Patient Details Name: Robert Vega MRN: [id number] DOB: 09/05/1940   Cancelled Treatment:    Reason Eval/Treat Not Completed: Patient at procedure or test/ unavailable. Pt OTF at cath lab for procedure. OT will re-attempt at later date/time as available.   Kimmberly Wisser L. Zyanna Leisinger, OTR/L  11/21/23, 11:18 AM

## 2023-11-22 DIAGNOSIS — Z4901 Encounter for fitting and adjustment of extracorporeal dialysis catheter: Secondary | ICD-10-CM

## 2023-11-22 DIAGNOSIS — N179 Acute kidney failure, unspecified: Secondary | ICD-10-CM | POA: Diagnosis not present

## 2023-11-22 DIAGNOSIS — R6520 Severe sepsis without septic shock: Secondary | ICD-10-CM | POA: Diagnosis not present

## 2023-11-22 DIAGNOSIS — A419 Sepsis, unspecified organism: Secondary | ICD-10-CM | POA: Diagnosis not present

## 2023-11-22 LAB — CBC
HCT: 22.9 % — ABNORMAL LOW (ref 39.0–52.0)
HCT: 23.8 % — ABNORMAL LOW (ref 39.0–52.0)
Hemoglobin: 7.2 g/dL — ABNORMAL LOW (ref 13.0–17.0)
Hemoglobin: 7.5 g/dL — ABNORMAL LOW (ref 13.0–17.0)
MCH: 29.1 pg (ref 26.0–34.0)
MCH: 29.6 pg (ref 26.0–34.0)
MCHC: 31.4 g/dL (ref 30.0–36.0)
MCHC: 31.5 g/dL (ref 30.0–36.0)
MCV: 92.7 fL (ref 80.0–100.0)
MCV: 94.1 fL (ref 80.0–100.0)
Platelets: 356 10*3/uL (ref 150–400)
Platelets: 330 10*3/uL (ref 150–400)
RBC: 2.47 MIL/uL — ABNORMAL LOW (ref 4.22–5.81)
RBC: 2.53 MIL/uL — ABNORMAL LOW (ref 4.22–5.81)
RDW: 14.7 % (ref 11.5–15.5)
RDW: 14.7 % (ref 11.5–15.5)
WBC: 15.3 10*3/uL — ABNORMAL HIGH (ref 4.0–10.5)
WBC: 16.7 10*3/uL — ABNORMAL HIGH (ref 4.0–10.5)
nRBC: 0 % (ref 0.0–0.2)
nRBC: 0 % (ref 0.0–0.2)

## 2023-11-22 LAB — GLUCOSE, CAPILLARY
Glucose-Capillary: 123 mg/dL — ABNORMAL HIGH (ref 70–99)
Glucose-Capillary: 116 mg/dL — ABNORMAL HIGH (ref 70–99)
Glucose-Capillary: 110 mg/dL — ABNORMAL HIGH (ref 70–99)

## 2023-11-22 LAB — BASIC METABOLIC PANEL WITH GFR
Anion gap: 12 (ref 5–15)
BUN: 73 mg/dL — ABNORMAL HIGH (ref 8–23)
CO2: 26 mmol/L (ref 22–32)
Calcium: 7.7 mg/dL — ABNORMAL LOW (ref 8.9–10.3)
Chloride: 97 mmol/L — ABNORMAL LOW (ref 98–111)
Creatinine, Ser: 6.96 mg/dL — ABNORMAL HIGH (ref 0.61–1.24)
GFR, Estimated: 7 mL/min — ABNORMAL LOW (ref >60)
Glucose, Bld: 117 mg/dL — ABNORMAL HIGH (ref 70–99)
Potassium: 5.3 mmol/L — ABNORMAL HIGH (ref 3.5–5.1)
Sodium: 135 mmol/L (ref 135–145)

## 2023-11-22 MED ORDER — FREE WATER
60.0000 mL | Status: DC
  Administered 2023-11-22 – 2023-11-26 (×23): 60 mL

## 2023-11-22 MED ORDER — HEPARIN SODIUM (PORCINE) 1000 UNIT/ML DIALYSIS
25.0000 [IU]/kg | INTRAMUSCULAR | Status: DC | PRN
  Administered 2023-11-22: 2500 [IU] via INTRAVENOUS_CENTRAL

## 2023-11-22 MED ORDER — ANTICOAGULANT SODIUM CITRATE 4% (200MG/5ML) IV SOLN: 5.0000 mL | Status: DC | PRN

## 2023-11-22 MED ORDER — ALTEPLASE 2 MG IJ SOLR: 2.0000 mg | Freq: Once | INTRAMUSCULAR | Status: DC | PRN

## 2023-11-22 MED ORDER — SODIUM ZIRCONIUM CYCLOSILICATE 5 G PO PACK
5.0000 g | PACK | Freq: Two times a day (BID) | ORAL | Status: AC
  Administered 2023-11-22: 5 g
  Filled 2023-11-22 (×2): qty 1

## 2023-11-22 MED ORDER — EPOETIN ALFA-EPBX 10000 UNIT/ML IJ SOLN
INTRAMUSCULAR | Status: AC
  Filled 2023-11-22: qty 1

## 2023-11-22 NOTE — Plan of Care (Signed)
 Problem: Education: Goal: Knowledge of General Education information will improve Description: Including pain rating scale, medication(s)/side effects and non-pharmacologic comfort measures Outcome: Progressing   Problem: Health Behavior/Discharge Planning: Goal: Ability to manage health-related needs will improve Outcome: Progressing   Problem: Clinical Measurements: Goal: Ability to maintain clinical measurements within normal limits will improve Outcome: Progressing Goal: Will remain free from infection Outcome: Progressing Goal: Diagnostic test results will improve Outcome: Progressing Goal: Respiratory complications will improve Outcome: Progressing Goal: Cardiovascular complication will be avoided Outcome: Progressing   Problem: Activity: Goal: Risk for activity intolerance will decrease Outcome: Progressing   Problem: Nutrition: Goal: Adequate nutrition will be maintained Outcome: Progressing   Problem: Coping: Goal: Level of anxiety will decrease Outcome: Progressing   Problem: Elimination: Goal: Will not experience complications related to bowel motility Outcome: Progressing Goal: Will not experience complications related to urinary retention Outcome: Progressing   Problem: Pain Managment: Goal: General experience of comfort will improve and/or be controlled Outcome: Progressing   Problem: Safety: Goal: Ability to remain free from injury will improve Outcome: Progressing   Problem: Skin Integrity: Goal: Risk for impaired skin integrity will decrease Outcome: Progressing   Problem: Activity: Goal: Ability to tolerate increased activity will improve Outcome: Progressing   Problem: Clinical Measurements: Goal: Ability to maintain a body temperature in the normal range will improve Outcome: Progressing   Problem: Respiratory: Goal: Ability to maintain adequate ventilation will improve Outcome: Progressing Goal: Ability to maintain a clear airway  will improve Outcome: Progressing   Problem: Education: Goal: Ability to describe self-care measures that may prevent or decrease complications (Diabetes Survival Skills Education) will improve Outcome: Progressing Goal: Individualized Educational Video(s) Outcome: Progressing   Problem: Coping: Goal: Ability to adjust to condition or change in health will improve Outcome: Progressing   Problem: Fluid Volume: Goal: Ability to maintain a balanced intake and output will improve Outcome: Progressing   Problem: Health Behavior/Discharge Planning: Goal: Ability to identify and utilize available resources and services will improve Outcome: Progressing Goal: Ability to manage health-related needs will improve Outcome: Progressing   Problem: Metabolic: Goal: Ability to maintain appropriate glucose levels will improve Outcome: Progressing   Problem: Nutritional: Goal: Maintenance of adequate nutrition will improve Outcome: Progressing Goal: Progress toward achieving an optimal weight will improve Outcome: Progressing   Problem: Skin Integrity: Goal: Risk for impaired skin integrity will decrease Outcome: Progressing   Problem: Tissue Perfusion: Goal: Adequacy of tissue perfusion will improve Outcome: Progressing   Problem: Activity: Goal: Ability to tolerate increased activity will improve Outcome: Progressing   Problem: Respiratory: Goal: Ability to maintain a clear airway and adequate ventilation will improve Outcome: Progressing   Problem: Role Relationship: Goal: Method of communication will improve Outcome: Progressing   Problem: Fluid Volume: Goal: Hemodynamic stability will improve Outcome: Progressing   Problem: Clinical Measurements: Goal: Diagnostic test results will improve Outcome: Progressing Goal: Signs and symptoms of infection will decrease Outcome: Progressing   Problem: Respiratory: Goal: Ability to maintain adequate ventilation will  improve Outcome: Progressing   Problem: Education: Goal: Knowledge of disease and its progression will improve Outcome: Progressing   Problem: Health Behavior/Discharge Planning: Goal: Ability to manage health-related needs will improve Outcome: Progressing   Problem: Clinical Measurements: Goal: Complications related to the disease process or treatment will be avoided or minimized Outcome: Progressing Goal: Dialysis access will remain free of complications Outcome: Progressing   Problem: Activity: Goal: Activity intolerance will improve Outcome: Progressing   Problem: Fluid Volume: Goal: Fluid volume balance will  be maintained or improved Outcome: Progressing   Problem: Clinical Measurements: Goal: Diagnostic test results will improve Outcome: Progressing   Problem: Clinical Measurements: Goal: Respiratory complications will improve Outcome: Progressing   Problem: Clinical Measurements: Goal: Cardiovascular complication will be avoided Outcome: Progressing

## 2023-11-22 NOTE — Progress Notes (Incomplete)
 Hemodialysis Note:  Received patient in bed to unit. Alert and oriented. Informed consent singed and in chart.  Treatment initiated: 0853 Treatment completed: ***  Access used: Right Subclavian catheter Access issues: ***  Patient tolerated well. Transported back to room, alert without acute distress. Report given to patient's RN.  Total UF removed: *** Medications given: Retacrit  10000 units IV  Post HD weight: unable to get weight due to weakness  [facility]

## 2023-11-22 NOTE — Progress Notes (Signed)
 PT Cancellation Note  Patient Details Name: Robert Vega MRN: [id number] DOB: [birth year]   Cancelled Treatment:     Pt currently off floor in HD. Acute PT will continue to follow and progress as able per current POC   Rankin Robert Vega 11/22/2023, 8:46 AM

## 2023-11-22 NOTE — Progress Notes (Signed)
 Hemodialysis Note:  Received patient in chair to unit. Alert. Informed consent singed and in chart.  Treatment initiated: 0853 Treatment completed: 1344  Access used: Right Subclavian catheter Access issues: None  Patient tolerated well. Transported back to room, alert without acute distress. Report given to patient's RN.  Total UF removed: 1.5 Liters Medications given: Retacrit  10000 units IV  Post HD weight: unable to get weight due to weakness  [facility]

## 2023-11-22 NOTE — Progress Notes (Signed)
 IVT consult placed for peripheral access for IV protonix . Currently the only IV medication ordered. Per primary RN, there is concern for possible infection his rising WBC. Triple lumen femoral HDC was removed which included a pigtail for labs and IV access. Currently has a tunneled catheter - would recommend considering IV abx during dialysis to prevent further peripheral vascular trauma given the repeated PIV insertions he has had during this hospitalization- increasing his risk for infection (in addition to the previous femoral line). If possible for his IV protonix  to be adjusted to PO or through tube, that would eliminate the need for peripheral access and further peripheral vascular trauma.   PIV placed - patient required re-direction due to his confusion. This also increases the risk for removing his lines and further complicating his access options.  Lamarkus Nebel R Alanis Clift, RN VAST- RN

## 2023-11-22 NOTE — Progress Notes (Addendum)
 [facility]  ROUNDING NOTE   Subjective:   Patient seen and evaluated during dialysis   HEMODIALYSIS FLOWSHEET:  Blood Flow Rate (mL/min): 199 mL/min Arterial Pressure (mmHg): -71.51 mmHg Venous Pressure (mmHg): 73.33 mmHg TMP (mmHg): -1.82 mmHg Ultrafiltration Rate (mL/min): 1050 mL/min Dialysate Flow Rate (mL/min): 299 ml/min  Seated in chair for treatment Easily arousable Denies discomfort    Objective:  Vital signs in last 24 hours:  Temp:  [97.4 F (36.3 C)-98.6 F (37 C)] 97.8 F (36.6 C) (06/27 0837) Pulse Rate:  [0-87] 80 (06/27 1000) Resp:  [16-31] 25 (06/27 1000) BP: (116-152)/(53-98) 135/63 (06/27 1000) SpO2:  [89 %-100 %] 99 % (06/27 1000)  Weight change:  Filed Weights   11/19/23 0345 11/20/23 0821 11/20/23 1410  Weight: 102.6 kg 103.6 kg 100.9 kg    Intake/Output: I/O last 3 completed shifts: In: 1873.3 [NG/GT:1873.3] Out: 0    Intake/Output this shift:  No intake/output data recorded.  Physical Exam: General: Chronically ill-appearing  Head: Oral mucosa moist  Eyes: Anicteric  Lungs:  Scattered rhonchi  Heart: Regular rate and rhythm  Abdomen:  Peg tube in place   Extremities: Trace peripheral edema.  Neurologic: Alert and oriented to self  Skin: No lesions  Access: Femoral dialysis catheter, Rt internal jugular permcath    Basic Metabolic Panel: Recent Labs  Lab 11/18/23 0800 11/19/23 0412 11/19/23 1248 11/20/23 0755 11/21/23 0358 11/22/23 0451  NA 134* 135 133* 133* 134* 135  K 6.3* 6.1* 6.0* 6.3* 5.1 5.3*  CL 97* 98 95* 96* 96* 97*  CO2 26 28 26 25 28 26   GLUCOSE 138* 133* 126* 119* 130* 117*  BUN 77* 54* 62* 77* 53* 73*  CREATININE 8.39* 5.87* 6.47* 7.74* 5.48* 6.96*  CALCIUM  7.0* 7.0* 7.2* 7.3* 7.5* 7.7*  PHOS 8.4* 6.6*  --  7.9*  --   --     Liver Function Tests: Recent Labs  Lab 11/18/23 0800 11/19/23 0412 11/20/23 0755  ALBUMIN 1.6* 1.6* 1.6*   No results for input(s): LIPASE, AMYLASE in  the last 168 hours. No results for input(s): AMMONIA in the last 168 hours.  CBC: Recent Labs  Lab 11/18/23 0800 11/20/23 0432 11/21/23 0358 11/22/23 0451  WBC 18.5* 16.1* 14.7* 15.3*  HGB 8.1* 7.5* 7.3* 7.2*  HCT 24.9* 24.0* 23.5* 22.9*  MCV 93.3 93.4 95.5 92.7  PLT 386 370 325 356    Cardiac Enzymes: No results for input(s): CKTOTAL, CKMB, CKMBINDEX, TROPONINI in the last 168 hours.  BNP: Invalid input(s): POCBNP  CBG: Recent Labs  Lab 11/19/23 1532 11/20/23 0700 11/20/23 0745 11/20/23 1641 11/22/23 0723  GLUCAP 123* 116* 132* 126* 123*    Microbiology: Results for orders placed or performed during the hospital encounter of 10/05/23  Blood Culture (routine x 2)     Status: None   Collection Time: 10/05/23  5:06 AM   Specimen: BLOOD  Result Value Ref Range Status   Specimen Description BLOOD LA  Final   Special Requests   Final    BOTTLES DRAWN AEROBIC AND ANAEROBIC Blood Culture results may not be optimal due to an inadequate volume of blood received in culture bottles   Culture   Final    NO GROWTH 5 DAYS Performed at [facility], [address], [place], [place] [address]    Report Status 10/10/2023 FINAL  Final  Blood Culture (routine x 2)     Status: None   Collection Time: 10/05/23  5:07 AM  Specimen: BLOOD  Result Value Ref Range Status   Specimen Description BLOOD RA  Final   Special Requests   Final    BOTTLES DRAWN AEROBIC AND ANAEROBIC Blood Culture results may not be optimal due to an inadequate volume of blood received in culture bottles   Culture   Final    NO GROWTH 5 DAYS Performed at [facility], [address] [place], [place] [address]    Report Status 10/10/2023 FINAL  Final  Resp panel by RT-PCR (RSV, Flu A&B, Covid) Anterior Nasal Swab     Status: None   Collection Time: 10/05/23  5:56 AM   Specimen: Anterior Nasal Swab  Result Value Ref Range Status   SARS Coronavirus 2 by RT PCR NEGATIVE  NEGATIVE Final    Comment: (NOTE) SARS-CoV-2 target nucleic acids are NOT DETECTED.  The SARS-CoV-2 RNA is generally detectable in upper respiratory specimens during the acute phase of infection. The lowest concentration of SARS-CoV-2 viral copies this assay can detect is 138 copies/mL. A negative result does not preclude SARS-Cov-2 infection and should not be used as the sole basis for treatment or other patient management decisions. A negative result may occur with  improper specimen collection/handling, submission of specimen other than nasopharyngeal swab, presence of viral mutation(s) within the areas targeted by this assay, and inadequate number of viral copies(<138 copies/mL). A negative result must be combined with clinical observations, patient history, and epidemiological information. The expected result is Negative.  Fact Sheet for Patients:  BloggerCourse.com  Fact Sheet for Healthcare Providers:  SeriousBroker.it  This test is no t yet approved or cleared by the [place]  FDA and  has been authorized for detection and/or diagnosis of SARS-CoV-2 by FDA under an Emergency Use Authorization (EUA). This EUA will remain  in effect (meaning this test can be used) for the duration of the COVID-19 declaration under Section 564(b)(1) of the Act, 21 U.S.C.section 360bbb-3(b)(1), unless the authorization is terminated  or revoked sooner.       Influenza A by PCR NEGATIVE NEGATIVE Final   Influenza B by PCR NEGATIVE NEGATIVE Final    Comment: (NOTE) The Xpert Xpress SARS-CoV-2/FLU/RSV plus assay is intended as an aid in the diagnosis of influenza from Nasopharyngeal swab specimens and should not be used as a sole basis for treatment. Nasal washings and aspirates are unacceptable for Xpert Xpress SARS-CoV-2/FLU/RSV testing.  Fact Sheet for Patients: BloggerCourse.com  Fact Sheet for Healthcare  Providers: SeriousBroker.it  This test is not yet approved or cleared by the [place]  FDA and has been authorized for detection and/or diagnosis of SARS-CoV-2 by FDA under an Emergency Use Authorization (EUA). This EUA will remain in effect (meaning this test can be used) for the duration of the COVID-19 declaration under Section 564(b)(1) of the Act, 21 U.S.C. section 360bbb-3(b)(1), unless the authorization is terminated or revoked.     Resp Syncytial Virus by PCR NEGATIVE NEGATIVE Final    Comment: (NOTE) Fact Sheet for Patients: BloggerCourse.com  Fact Sheet for Healthcare Providers: SeriousBroker.it  This test is not yet approved or cleared by the [place]  FDA and has been authorized for detection and/or diagnosis of SARS-CoV-2 by FDA under an Emergency Use Authorization (EUA). This EUA will remain in effect (meaning this test can be used) for the duration of the COVID-19 declaration under Section 564(b)(1) of the Act, 21 U.S.C. section 360bbb-3(b)(1), unless the authorization is terminated or revoked.  Performed at [facility], [address] [place],  [place] [address]   Respiratory (~20 pathogens) panel by PCR     Status: None   Collection Time: 10/05/23  8:11 AM   Specimen: Nasopharyngeal Swab; Respiratory  Result Value Ref Range Status   Adenovirus NOT DETECTED NOT DETECTED Final   Coronavirus 229E NOT DETECTED NOT DETECTED Final    Comment: (NOTE) The Coronavirus on the Respiratory Panel, DOES NOT test for the novel  Coronavirus (2019 nCoV)    Coronavirus HKU1 NOT DETECTED NOT DETECTED Final   Coronavirus NL63 NOT DETECTED NOT DETECTED Final   Coronavirus OC43 NOT DETECTED NOT DETECTED Final   Metapneumovirus NOT DETECTED NOT DETECTED Final   Rhinovirus / Enterovirus NOT DETECTED NOT DETECTED Final   Influenza A NOT DETECTED NOT DETECTED Final   Influenza B NOT  DETECTED NOT DETECTED Final   Parainfluenza Virus 1 NOT DETECTED NOT DETECTED Final   Parainfluenza Virus 2 NOT DETECTED NOT DETECTED Final   Parainfluenza Virus 3 NOT DETECTED NOT DETECTED Final   Parainfluenza Virus 4 NOT DETECTED NOT DETECTED Final   Respiratory Syncytial Virus NOT DETECTED NOT DETECTED Final   Bordetella pertussis NOT DETECTED NOT DETECTED Final   Bordetella Parapertussis NOT DETECTED NOT DETECTED Final   Chlamydophila pneumoniae NOT DETECTED NOT DETECTED Final   Mycoplasma pneumoniae NOT DETECTED NOT DETECTED Final    Comment: Performed at [facility], [address], [place], [place] [address]  Expectorated Sputum Assessment w Gram Stain, Rflx to Resp Cult     Status: None   Collection Time: 10/05/23  9:07 AM   Specimen: Sputum  Result Value Ref Range Status   Specimen Description SPUTUM  Final   Special Requests NONE  Final   Sputum evaluation   Final    Sputum specimen not acceptable for testing.  Please recollect.   C/KERRY NELSON AT 1005 10/05/23.PMF Performed at [facility], [address] [place], [place] [address]    Report Status 10/05/2023 FINAL  Final  Expectorated Sputum Assessment w Gram Stain, Rflx to Resp Cult     Status: None   Collection Time: 10/05/23 10:50 AM  Result Value Ref Range Status   Specimen Description EXPECTORATED SPUTUM  Final   Special Requests NONE  Final   Sputum evaluation   Final    THIS SPECIMEN IS ACCEPTABLE FOR SPUTUM CULTURE Performed at [facility], [address] [place], [place] [address]    Report Status 10/05/2023 FINAL  Final  Culture, Respiratory w Gram Stain     Status: None   Collection Time: 10/05/23 10:50 AM  Result Value Ref Range Status   Specimen Description   Final    EXPECTORATED SPUTUM Performed at [facility], [address] [place], [place] [address]    Special Requests   Final    NONE Reflexed from [id number] Performed at [facility], [address] [place], [place] [address]    Gram Stain   Final    RARE WBC SEEN RARE VONNE POSITIVE RODS RARE VONNE POSITIVE COCCI RARE GRAM NEGATIVE RODS    Culture   Final    FEW Normal respiratory flora-no Staph aureus or Pseudomonas seen Performed at [facility], [address], [place], [place] [address]    Report Status 10/07/2023 FINAL  Final  MRSA Next Gen by PCR, Nasal     Status: None   Collection Time: 10/06/23 12:57 AM   Specimen: Nasal Mucosa; Nasal Swab  Result Value Ref Range Status  MRSA by PCR Next Gen NOT DETECTED NOT DETECTED Final    Comment: (NOTE) The GeneXpert MRSA Assay (FDA approved for NASAL specimens only), is one component of a comprehensive MRSA colonization surveillance program. It is not intended to diagnose MRSA infection nor to guide or monitor treatment for MRSA infections. Test performance is not FDA approved in patients less than 15 years old. Performed at [facility], [address] [place], [place] [address]   Culture, Respiratory w Gram Stain     Status: None   Collection Time: 10/06/23 11:37 AM   Specimen: INDUCED SPUTUM  Result Value Ref Range Status   Specimen Description   Final    INDUCED SPUTUM Performed at [facility], [address] [place], [place] [address]    Special Requests   Final    NONE Performed at [facility], [address] [place], [place] [address]    Gram Stain   Final    FEW WBC PRESENT,BOTH PMN AND MONONUCLEAR FEW GRAM POSITIVE RODS    Culture   Final    MODERATE LACTOBACILLUS FERMENTUM Standardized susceptibility testing for this organism is not available. Performed at [facility], [address] [place], [place] [address]    Report Status 10/09/2023 FINAL  Final  Culture, blood (x 2)     Status: None   Collection Time: 10/22/23  1:00 AM   Specimen: BLOOD  Result Value Ref Range Status   Specimen Description BLOOD BLOOD RIGHT ARM  Final   Special Requests    Final    BOTTLES DRAWN AEROBIC AND ANAEROBIC Blood Culture adequate volume   Culture   Final    NO GROWTH 5 DAYS Performed at [facility], [address], [place], [place] [address]    Report Status 10/27/2023 FINAL  Final  Culture, blood (x 2)     Status: None   Collection Time: 10/22/23  1:00 AM   Specimen: BLOOD  Result Value Ref Range Status   Specimen Description BLOOD BLOOD RIGHT ARM  Final   Special Requests   Final    BOTTLES DRAWN AEROBIC AND ANAEROBIC Blood Culture adequate volume   Culture   Final    NO GROWTH 5 DAYS Performed at [facility], [address] [place], [place] [address]    Report Status 10/27/2023 FINAL  Final  Resp panel by RT-PCR (RSV, Flu A&B, Covid) Anterior Nasal Swab     Status: None   Collection Time: 11/09/23  5:31 PM   Specimen: Anterior Nasal Swab  Result Value Ref Range Status   SARS Coronavirus 2 by RT PCR NEGATIVE NEGATIVE Final    Comment: (NOTE) SARS-CoV-2 target nucleic acids are NOT DETECTED.  The SARS-CoV-2 RNA is generally detectable in upper respiratory specimens during the acute phase of infection. The lowest concentration of SARS-CoV-2 viral copies this assay can detect is 138 copies/mL. A negative result does not preclude SARS-Cov-2 infection and should not be used as the sole basis for treatment or other patient management decisions. A negative result may occur with  improper specimen collection/handling, submission of specimen other than nasopharyngeal swab, presence of viral mutation(s) within the areas targeted by this assay, and inadequate number of viral copies(<138 copies/mL). A negative result must be combined with clinical observations, patient history, and epidemiological information. The expected result is Negative.  Fact Sheet for Patients:  BloggerCourse.com  Fact Sheet for Healthcare Providers:  SeriousBroker.it  This test is no t yet  approved  or cleared by the [place]  FDA and  has been authorized for detection and/or diagnosis of SARS-CoV-2 by FDA under an Emergency Use Authorization (EUA). This EUA will remain  in effect (meaning this test can be used) for the duration of the COVID-19 declaration under Section 564(b)(1) of the Act, 21 U.S.C.section 360bbb-3(b)(1), unless the authorization is terminated  or revoked sooner.       Influenza A by PCR NEGATIVE NEGATIVE Final   Influenza B by PCR NEGATIVE NEGATIVE Final    Comment: (NOTE) The Xpert Xpress SARS-CoV-2/FLU/RSV plus assay is intended as an aid in the diagnosis of influenza from Nasopharyngeal swab specimens and should not be used as a sole basis for treatment. Nasal washings and aspirates are unacceptable for Xpert Xpress SARS-CoV-2/FLU/RSV testing.  Fact Sheet for Patients: BloggerCourse.com  Fact Sheet for Healthcare Providers: SeriousBroker.it  This test is not yet approved or cleared by the [place]  FDA and has been authorized for detection and/or diagnosis of SARS-CoV-2 by FDA under an Emergency Use Authorization (EUA). This EUA will remain in effect (meaning this test can be used) for the duration of the COVID-19 declaration under Section 564(b)(1) of the Act, 21 U.S.C. section 360bbb-3(b)(1), unless the authorization is terminated or revoked.     Resp Syncytial Virus by PCR NEGATIVE NEGATIVE Final    Comment: (NOTE) Fact Sheet for Patients: BloggerCourse.com  Fact Sheet for Healthcare Providers: SeriousBroker.it  This test is not yet approved or cleared by the [place]  FDA and has been authorized for detection and/or diagnosis of SARS-CoV-2 by FDA under an Emergency Use Authorization (EUA). This EUA will remain in effect (meaning this test can be used) for the duration of the COVID-19 declaration under Section 564(b)(1) of  the Act, 21 U.S.C. section 360bbb-3(b)(1), unless the authorization is terminated or revoked.  Performed at [facility], [address] [place], [place] [address]   Respiratory (~20 pathogens) panel by PCR     Status: None   Collection Time: 11/09/23  5:31 PM   Specimen: Nasopharyngeal Swab; Respiratory  Result Value Ref Range Status   Adenovirus NOT DETECTED NOT DETECTED Final   Coronavirus 229E NOT DETECTED NOT DETECTED Final    Comment: (NOTE) The Coronavirus on the Respiratory Panel, DOES NOT test for the novel  Coronavirus (2019 nCoV)    Coronavirus HKU1 NOT DETECTED NOT DETECTED Final   Coronavirus NL63 NOT DETECTED NOT DETECTED Final   Coronavirus OC43 NOT DETECTED NOT DETECTED Final   Metapneumovirus NOT DETECTED NOT DETECTED Final   Rhinovirus / Enterovirus NOT DETECTED NOT DETECTED Final   Influenza A NOT DETECTED NOT DETECTED Final   Influenza B NOT DETECTED NOT DETECTED Final   Parainfluenza Virus 1 NOT DETECTED NOT DETECTED Final   Parainfluenza Virus 2 NOT DETECTED NOT DETECTED Final   Parainfluenza Virus 3 NOT DETECTED NOT DETECTED Final   Parainfluenza Virus 4 NOT DETECTED NOT DETECTED Final   Respiratory Syncytial Virus NOT DETECTED NOT DETECTED Final   Bordetella pertussis NOT DETECTED NOT DETECTED Final   Bordetella Parapertussis NOT DETECTED NOT DETECTED Final   Chlamydophila pneumoniae NOT DETECTED NOT DETECTED Final   Mycoplasma pneumoniae NOT DETECTED NOT DETECTED Final    Comment: Performed at [facility], [address] [place], [place] [address]  Culture, blood (Routine X 2) w Reflex to ID Panel     Status: None   Collection Time: 11/09/23  6:16 PM   Specimen: BLOOD  Result Value Ref Range Status  Specimen Description   Final    BLOOD RIGHT ANTECUBITAL Performed at [facility], [address] [place], [place] [address]    Special Requests   Final    BOTTLES DRAWN AEROBIC ONLY Blood Culture adequate volume Performed  at [facility], [address] [place], [place] [address]    Culture   Final    NO GROWTH 11 DAYS Performed at [facility], [address] [place], [place] [address]    Report Status 11/20/2023 FINAL  Final  Culture, blood (Routine X 2) w Reflex to ID Panel     Status: None   Collection Time: 11/09/23  6:23 PM   Specimen: BLOOD  Result Value Ref Range Status   Specimen Description   Final    BLOOD BLOOD LEFT FOREARM Performed at [facility], [address], [place], [place] [address]    Special Requests   Final    BOTTLES DRAWN AEROBIC AND ANAEROBIC Blood Culture adequate volume Performed at [facility], [address] [place], [place] [address]    Culture   Final    NO GROWTH 11 DAYS Performed at [facility], [address], [place], [place] [address]    Report Status 11/20/2023 FINAL  Final  Urine Culture     Status: Abnormal   Collection Time: 11/10/23  1:58 AM   Specimen: Urine, Random  Result Value Ref Range Status   Specimen Description   Final    URINE, RANDOM Performed at [facility], [address] [place], [place] [address]    Special Requests   Final    NONE Reflexed from [id number] Performed at [facility], [address] [place], [place] [address]    Culture 60,000 COLONIES/mL ENTEROCOCCUS FAECALIS (A)  Final   Report Status 11/12/2023 FINAL  Final   Organism ID, Bacteria ENTEROCOCCUS FAECALIS (A)  Final      Susceptibility   Enterococcus faecalis - MIC*    AMPICILLIN  <=2 SENSITIVE Sensitive     NITROFURANTOIN <=16 SENSITIVE Sensitive     VANCOMYCIN 1 SENSITIVE Sensitive     * 60,000 COLONIES/mL ENTEROCOCCUS FAECALIS    Coagulation Studies: No results for input(s): LABPROT, INR in the last 72 hours.  Urinalysis: No results for input(s): COLORURINE, LABSPEC, PHURINE, GLUCOSEU, HGBUR, BILIRUBINUR, KETONESUR, PROTEINUR, UROBILINOGEN, NITRITE, LEUKOCYTESUR in the  last 72 hours.  Invalid input(s): APPERANCEUR     Imaging: PERIPHERAL VASCULAR CATHETERIZATION Result Date: 11/21/2023 See surgical note for result.      Medications:    feeding supplement (NEPRO CARB STEADY) 1,000 mL (11/21/23 2139)    sodium chloride    Intravenous Once   Chlorhexidine  Gluconate Cloth  6 each Topical Q0600   epoetin  alfa-epbx (RETACRIT ) injection  10,000 Units Intravenous Q M,W,F-HD   feeding supplement (PROSource TF20)  60 mL Per Tube Daily   free water   30 mL Per Tube Q4H   gentamicin  ointment   Topical TID   heparin  injection (subcutaneous)  5,000 Units Subcutaneous Q8H   insulin  aspart  10 Units Subcutaneous Once   multivitamin  1 tablet Per Tube QHS   pantoprazole  (PROTONIX ) IV  40 mg Intravenous Q12H   sodium zirconium cyclosilicate   5 g Per Tube BID   acetaminophen  **OR** acetaminophen , artificial tears, bisacodyl , heparin , ipratropium-albuterol , [DISCONTINUED] ondansetron  **OR** ondansetron  (ZOFRAN ) IV, mouth rinse  Assessment/ Plan:  Mr. Robert Vega is a 83 y.o.  male with obstructive sleep apnea, GERD, obesity, hypertension, dysphagia, severe  esophageal dysmotility with achalasia status post PEG tube placement, recent aspiration pneumonia acute respiratory failure status post extubation 10/09/2023, who was admitted to [facility] on 10/05/2023 for Aspiration into respiratory tract, initial encounter [T17.908A] Severe sepsis (HCC) [A41.9, R65.20] History of dysphagia [Z87.898] Fever, unspecified fever cause [R50.9] Multifocal pneumonia [J18.9]   1.  Acute kidney injury.  Baseline creatinine 0.81 from 10/14/2023.  Acute kidney injury secondary to ATN, obstructive uropathy, and progression of prerenal azotemia.   Patient is critically ill.  He was started on urgent hemodialysis for severe hyperkalemia and uremia. - Receiving dialysis today, UF 1L as tolerated. Tolerating treatment well seated in chair - Vascular surgery placed PermCath on 6/26.  HD temp  cath remains in place, will re-direct nursing to order for removal placed yesterday.  -Next dialysis treatment scheduled for Monday - TOC dialysis coordinator notified of need for outpatient dialysis clinic.  Search in progress.   2.  Severe hyperkalemia-potassium 5.3 today, will correct with 2K bath during dialysis.    3.  Sepsis due to aspiration pneumonia, found to have distal esophageal obstruction with severe dysmotility.  He is status post botulinum toxin injection to the lower esophageal sphincter.  PEG tube placed this admission. Receiving continuous tube feeds.   4.Anemia in the setting of renal failure Lab Results  Component Value Date   HGB 7.2 (L) 11/22/2023  Hemoglobin remains decreased, continue Epogen  10,000 units IV with dialysis.   5.  Bilateral hydronephrosis Noted on CT.Urology team has evaluated the patient and it is thought to be secondary to reflux.  No intervention at this time due to fragile health.     LOS: 48 Shantelle Breeze 6/27/202510:21 AM Patient was seen and examined with Faith Harris, NP.  Plan of care was formulated for the problems addressed and discussed with NP.  I agree with the note as documented except as noted below.

## 2023-11-22 NOTE — Progress Notes (Signed)
 Attempted transfer to the dialysis chair x 2 staff, unsuccessful, patient unable to follow commands to bear weight, made it to dangle on the edge of the bed but he was unable to stand, assisted back to the lying position and third staff called in to assist, overhead lift used to transfer patient safely into the dialysis chair.  Robert Vega

## 2023-11-22 NOTE — TOC Progression Note (Addendum)
 Transition of Care [facility]) - Progression Note    Patient Details  Name: Robert Vega MRN: [id number] Date of Birth: [birth year]  Transition of Care [facility]) CM/SW Contact  Lauraine [name], LCSW Phone Number: 11/22/2023, 3:56 PM  Clinical Narrative: [facility] does not have any MWF chair time availability. [facility] is unable to accommodate TTS schedules due to transportation issues. Liaison is checking to see if they can transport to another center if he gets a MWF schedule. Sent email to granddaughter and son to notify.    4:08 pm: [facility] is trying to figure out which other HD centers they are contracted with. Many facilities are telling him to call back Monday.  Expected Discharge Plan and Services                                               Social Determinants of Health (SDOH) Interventions SDOH Screenings   Food Insecurity: Patient Unable To Answer (10/06/2023)  Recent Concern: Food Insecurity - Food Insecurity Present (09/11/2023)   Received from [facility]  Housing: Patient Unable To Answer (10/06/2023)  Recent Concern: Housing - High Risk (09/11/2023)   Received from [facility]  Transportation Needs: Patient Unable To Answer (10/06/2023)  Utilities: Patient Unable To Answer (10/06/2023)  Depression (PHQ2-9): Low Risk  (05/07/2023)  Financial Resource Strain: Medium Risk (09/11/2023)   Received from [facility]  Social Connections: Unknown (10/06/2023)  Tobacco Use: Medium Risk (11/05/2023)    Readmission Risk Interventions     No data to display

## 2023-11-22 NOTE — Progress Notes (Signed)
 PROGRESS NOTE Robert Vega    DOB: [birth year], 83 y.o.  FMW:[id number]    Code Status: Full Code   DOA: 10/05/2023   LOS: 48  Brief hospital course  Robert Vega is a 83 y.o male with significant PMH of OSA, GERD, Obesity, HTN, Dysphagia - presented to the ED 10/05/2023 from with hypoxia, fever and generalized weakness. Dx sepsis/pneumonia, concern for aspiration    Of note, EGD 03/2022 with food in upper esophagus complicated by aspiration event, cardiac arrest with round of CPR, and post resuscitation EGD with concern for lack of peristalsis. Hx prior EGD 08/2020 with note of abnormal cricopharyngeus, decrease in motility in esophagus, and spastic LES    5/10: Admit to [facility] service with sepsis due to Aspiration Pneumonia.  Course complicated by Acute Respiratory Failure due to Aspiration of vomitus w/ cardiac arrest transfer to ICU and intubation.  5/11: flexible bronchoscopy  5/13: PEG tube, +vomiting last night, failed SAT/SBT 5/14: extubated but with severe delirium 5/20:  Botulinum toxin injection into the lower esophageal sphincter by Dr. Jinny 5/21: esophagram showing diffusely distended esophagus with distal obstruction and severe dysmotility suggestive of achalasia. At that point patient was stabilized to point of pursuing SNF placement but then with complications of PEG tube placement resulted in return to OR 5/27 and tube feeds restarted 5/28 with zosyn  for peritonitis.  Had new melena 6/9 so underwent another EGD without acute bleeding seen.  Renal function declined and nephrology was consulted and without improvement with conservative management, he received dialysis access and started on HD 6/16 as this was in line with family goals of care.  Discharge will now be pending arrangement of facility and outpatient HD if he does not have renal recovery.  He remains in poor prognosis state with intermittently needing to hold tube feeds again for intolerance and needed blood  transfusion 6/18 for anemia likely related more to the renal failure as no source of bleeding has been identified thus far. Mental status is oriented to self and location and able to follow simple commands but otherwise confused. PT/OT recommends SNF. Permcath placement scheduled for this am.  Assessment & Plan  Goals of care- HCPOA paperwork received 6/25 indicating granddaughter [name] is primary and son, [name] is secondary. Updates to be given to only primary and they can talk amongst themselves or both be present on update calls but cannot support 28min+ calls for multiple family members daily.  - Re code status: family confirms that he is to remain full code - SNF at dc with outpatient HD as it does not appear that he is having renal recovery at this time. He will need to tolerate chair for HD which he is doing today for first time with new permcath. - they want full scope of care and appear to have expectations of full recovery despite poor progress and prognosis.  - continue PT/OT, optimize for discharge to rehab as he is close to medical optimization and will need significant therapy if going to have meaningful recovery of function  AKI  Acute Renal Failure- secondary to ATN. now on HD. Had very little uop and irritation with foley so it was removed 6/19. Patients granddaughter insistent on getting urinalysis to check for UTI. Just completed course of unasyn  6/20 for possible UTI. He has minimal UOP and incontinent so clean catch is not possible and unlikely to get reliable results from catheterization which was explained to her but she still insists we check. UPDATE: nurse attempted  straight cath an no urine output so unable to run urinalysis. Granddaughter continues to insist on urinalysis and wants us  to bolus him or give more free water  in order to produce urine for a urinalysis- re-education provided multiple times that lack of urine is sign of kidney failure and not dehydration and giving  excess fluids to HD patient is dangerous for risk of hypervolemia.  Hyperkalemia- persistent. Addressed with HD and lokelma  on non-HD days, changed nutrition to low-K+ formula, nepro which has improved  - bladder scan as needed if suspecting any retention - continue HD per nephrology  - Monitor UOP - will need outpatient HD set up if in line to continue with family GOC. First HD in chair going well today, 6/27. First use of his permcath as well after placement 6/26. - patient is planned to be discharged to [facility] and would expect to be medically ready by the time OP HD is arranged.  - BMP am  Decreased bowel sounds  Question ileus- resolved. Having regular Bms and seems to be tolerating tube feeds. - Monitor abdominal status frequently and reimage if concern arises.   Anemia 2/2 renal failure and questionable melena- Received 1 unit pRBCs 6/10, another 6/18 and hgb is stable >7. No signs of active bleed. Repeat EGD 06/10 no concerns, GI has s/o. HH has been stable. Hgb 7.9>>>7.2 - CBC am. Maintain hgb goal >7 - continue EPO treatments with HD, dose has been increased.    Achalasia  Dysphagia  Aspiration risk is high- Status post botulinum toxin injection on 5/20, several EGD and SLP evals. Not safe for PO intake unless family wants to accept risk that he will 100% aspirate again and if that is the case, would recommend comfort care and then could liberalize diet for comfort.  - tube feeds as able, per RD - N.p.o. for the foreseeable future per GI - SLP evaluated, last saw on 6/12. Again reached out on 6/25 due to granddaughter insistence and they again reiterated that diet decision is GI driven.  - Consider outpatient referral to tertiary advanced GI for consideration of POEMS procedure per granddaughter preference. Recommend this only after a few weeks recovery period.  - RD consulted for tube feeds. May benefit from bolus feeds for consideration of outpatient HD coordination.     Severe sepsis - resolved. Initially w/ aspiration pna. Later in hospital stay w/ intra-abdominal infection secondary to PEG dislodged Acute respiratory failure with hypoxia and hypercapnia  Pneumonia- resolved - s/p intubation 5/10-5/14 - remains on 2L [place]. Wean as able. 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 Reversible causes addressed and had significant improvement with initiating HD but still not back to previous state. I suspect he may have had anoxic brain injury during his critical illness in ICU requiring intubation and pressors. There was no signs of acute injury on head CT 5/16. Did show chronic microvascular ischemic disease.  - Granddaughter has asked to discontinue prn antipsychotics - delirium precautions - tele sitter   GERD (gastroesophageal reflux disease) Continue PPI   Essential hypertension, benign Bp wnl off meds Monitor VS     Generalized weakness - PT/OT ordered  Body mass index is 32.83 kg/m.  VTE ppx: heparin  injection 5,000 Units Start: 11/13/23 2200heparin  Diet:     Diet   Diet  NPO time specified Except for: Ice Chips   Consultants: Nephrology  GI Vascular General surgery Palliative care CCM  Subjective 11/22/23    Pt reports no complaints. Denies pain. Alert today and oriented to self and location. Appears extremely tired during HD today. Has self-removed his O2.    Objective  Blood pressure 101/70, pulse 86, temperature 97.7 F (36.5 C), temperature source Oral, resp. rate 19, height 5' 9.02 (1.753 m), weight 102.1 kg, SpO2 95%.  Intake/Output Summary (Last 24 hours) at 11/22/2023 0731 Last data filed at 11/21/2023 2135 Gross per 24 hour  Intake --  Output 0 ml  Net 0 ml   Filed Weights   11/19/23 0345 11/20/23 0821 11/20/23  1410  Weight: 102.6 kg 103.6 kg 100.9 kg    Physical Exam:  General: awake, alert to voice, NAD HEENT: atraumatic, MMM, hearing grossly normal Respiratory: normal respiratory effort. Significant stertor when sleeping. O2 was not in nose upon entry to room and denies SOB.  Cardiovascular: quick capillary refill Gastrointestinal: soft, NT, ND Nervous: A&O xself and that he's in the hospital. Moving all limbs spontaneously Extremities: moves all equally, trace edema, normal tone Skin: dry, intact, normal temperature, normal color. No rashes, lesions or ulcers on exposed skin Psychiatry: calm and cooperative. able to follow commands.   Labs   I have personally reviewed the following labs and imaging studies CBC    Component Value Date/Time   WBC 15.3 (H) 11/22/2023 0451   RBC 2.47 (L) 11/22/2023 0451   HGB 7.2 (L) 11/22/2023 0451   HCT 22.9 (L) 11/22/2023 0451   PLT 356 11/22/2023 0451   MCV 92.7 11/22/2023 0451   MCH 29.1 11/22/2023 0451   MCHC 31.4 11/22/2023 0451   RDW 14.7 11/22/2023 0451   LYMPHSABS 1.6 10/31/2023 0218   MONOABS 2.3 (H) 10/31/2023 0218   EOSABS 0.1 10/31/2023 0218   BASOSABS 0.1 10/31/2023 0218      Latest Ref Rng & Units 11/22/2023    4:51 AM 11/21/2023    3:58 AM 11/20/2023    7:55 AM  BMP  Glucose 70 - 99 mg/dL 882  869  880   BUN 8 - 23 mg/dL 73  53  77   Creatinine 0.61 - 1.24 mg/dL 3.03  4.51  2.25   Sodium 135 - 145 mmol/L 135  134  133   Potassium 3.5 - 5.1 mmol/L 5.3  5.1  6.3   Chloride 98 - 111 mmol/L 97  96  96   CO2 22 - 32 mmol/L 26  28  25    Calcium  8.9 - 10.3 mg/dL 7.7  7.5  7.3    PERIPHERAL VASCULAR CATHETERIZATION Result Date: 11/21/2023 See surgical note for result.   Disposition Plan & Communication  Patient status: Inpatient  Admitted From: Home Planned disposition location: SNF Anticipated discharge date: TBD pending renal function, HD set up OP  Family Communication: dranddaughter on phone   Author: Marien LITTIE Piety, DO [facility] 11/22/2023, 7:31 AM   Available by Epic secure chat 7AM-7PM. If 7PM-7AM, please contact night-coverage.  [facility] contact information found on [email or web address].

## 2023-11-22 NOTE — Progress Notes (Signed)
  Progress Note    11/22/2023 2:09 PM 1 Day Post-Op  Subjective: Robert Vega is an 83 year old male who is now postop day 1 from dialysis permacatheter tunneled catheter placement.  I saw the patient in hemodialysis this morning.  Dialysis nursing reports catheter is running well no issues with hemodialysis.  Patient remains pleasantly confused this morning but appears comfortable.   Vitals:   11/22/23 1343 11/22/23 1344  BP: (!) 105/45 103/60  Pulse: 79 78  Resp: (!) 24 (!) 24  Temp: (!) 97.3 F (36.3 C)   SpO2: 95% 95%   Physical Exam: Cardiac:  RRR, normal S1 and S2, no rubs clicks gallops or murmurs. Lungs: Normal nonlabored breathing, no rales rhonchi or wheezing revealing sounds lungs diminished in the bases. Incisions: Right chest tunneled dialysis catheters. Dressing clean dry and intact.  No hematoma seroma or infection noted. Extremities: All extremities warm to touch with palpable pulses. Abdomen: Positive bowel sounds throughout, soft, nontender nondistended. Neurologic: Patient is nonresponsive this morning extremely somnolent.   CBC    Component Value Date/Time   WBC 15.3 (H) 11/22/2023 0451   RBC 2.47 (L) 11/22/2023 0451   HGB 7.2 (L) 11/22/2023 0451   HCT 22.9 (L) 11/22/2023 0451   PLT 356 11/22/2023 0451   MCV 92.7 11/22/2023 0451   MCH 29.1 11/22/2023 0451   MCHC 31.4 11/22/2023 0451   RDW 14.7 11/22/2023 0451   LYMPHSABS 1.6 10/31/2023 0218   MONOABS 2.3 (H) 10/31/2023 0218   EOSABS 0.1 10/31/2023 0218   BASOSABS 0.1 10/31/2023 0218    BMET    Component Value Date/Time   NA 135 11/22/2023 0451   K 5.3 (H) 11/22/2023 0451   CL 97 (L) 11/22/2023 0451   CO2 26 11/22/2023 0451   GLUCOSE 117 (H) 11/22/2023 0451   BUN 73 (H) 11/22/2023 0451   CREATININE 6.96 (H) 11/22/2023 0451   CALCIUM  7.7 (L) 11/22/2023 0451   GFRNONAA 7 (L) 11/22/2023 0451   GFRAA 108 04/17/2007 0000    INR    Component Value Date/Time   INR 1.2 10/22/2023 0100      Intake/Output Summary (Last 24 hours) at 11/22/2023 1409 Last data filed at 11/22/2023 1344 Gross per 24 hour  Intake --  Output 1500 ml  Net -1500 ml     Assessment/Plan:  83 y.o. male is s/p tunneled dialysis permacatheter.  1 Day Post-Op   PLAN Okay per vascular surgery to use tunneled dialysis permacatheter for hemodialysis. Vascular surgery will sign off this case at this time.  DVT prophylaxis: Heparin  with dialysis   Gwendlyn JONELLE Shank [facility] 11/22/2023 2:09 PM

## 2023-11-22 NOTE — NC FL2 (Signed)
 [place]  MEDICAID FL2 LEVEL OF CARE FORM     IDENTIFICATION  Patient Name: Robert Vega Birthdate: [birth year] Sex: male Admission Date (Current Location): 10/05/2023  [place] and [place] Number:  Chiropodist and Address:  [facility], [address], [place], [place] [address]      Provider Number: [id number]  Attending Physician Name and Address:  Lenon Marien CROME, MD  Relative Name and Phone Number:       Current Level of Care: Hospital Recommended Level of Care: Skilled Nursing Facility Prior Approval Number:    Date Approved/Denied:   PASRR Number: [id number]  Discharge Plan: SNF    Current Diagnoses: Patient Active Problem List   Diagnosis Date Noted   Melena 11/05/2023   Malnutrition of moderate degree 10/30/2023   Generalized abdominal pain 10/21/2023   Gastrostomy complication (HCC) 10/20/2023   AKI (acute kidney injury) (HCC) 10/20/2023   Generalized weakness 10/19/2023   Severe sepsis (HCC) 10/16/2023   Acute delirium 10/16/2023   Obesity (BMI 30-39.9) 10/16/2023   Aspiration pneumonia (HCC) 10/11/2023   Achalasia of esophagus 10/11/2023   History of dysphagia 10/09/2023   Multifocal pneumonia 10/05/2023   Acute respiratory failure with hypoxia and hypercapnia (HCC) 10/05/2023   Leg weakness 08/21/2023   Cardiac arrest (HCC) 04/02/2022   Essential hypertension, benign 09/13/2021   Dysphagia 08/24/2020   Phimosis 10/23/2016   Mood disorder (HCC) 04/10/2016   History of melanoma in situ 10/18/2014   Advance directive discussed with patient 04/05/2014   Sleep disturbance 03/18/2012   GERD (gastroesophageal reflux disease)    BPH (benign prostatic hyperplasia)    Encounter for dialysis catheter care (HCC) 03/06/2011   Spinal stenosis of lumbar region 04/20/2008   Osteoarthritis 04/20/2008   Allergic rhinitis due to pollen 04/17/2007    Orientation RESPIRATION BLADDER Height & Weight     Self  O2 (Nasal  Cannula 2L) Incontinent Weight: 222 lb 7.1 oz (100.9 kg) Height:  5' 9.02 (175.3 cm)  BEHAVIORAL SYMPTOMS/MOOD NEUROLOGICAL BOWEL NUTRITION STATUS   (None)  (None) Incontinent Feeding tube  AMBULATORY STATUS COMMUNICATION OF NEEDS Skin   Extensive Assist Verbally Skin abrasions, Bruising, Other (Comment), Surgical wounds (Erythema/redness. Incision on abdomen: No dressing.)                       Personal Care Assistance Level of Assistance  Bathing, Feeding, Dressing Bathing Assistance: Maximum assistance Feeding assistance: Maximum assistance Dressing Assistance: Maximum assistance     Functional Limitations Info  Sight, Hearing, Speech Sight Info: Adequate Hearing Info: Adequate Speech Info: Adequate    SPECIAL CARE FACTORS FREQUENCY  PT (By licensed PT), OT (By licensed OT)     PT Frequency: 5 x week OT Frequency: 5 x week            Contractures Contractures Info: Not present    Additional Factors Info  Code Status, Allergies Code Status Info: Full code Allergies Info: Sulfonamide Derivatives           Current Medications (11/22/2023):  This is the current hospital active medication list Current Facility-Administered Medications  Medication Dose Route Frequency Provider Last Rate Last Admin   0.9 %  sodium chloride  infusion (Manually program via Guardrails IV Fluids)   Intravenous Once Dew, Jason S, MD       acetaminophen  (TYLENOL ) tablet 650 mg  650 mg Oral Q6H PRN Dew, Jason S, MD   650 mg at 11/18/23 0430   Or  acetaminophen  (TYLENOL ) suppository 650 mg  650 mg Rectal Q6H PRN Dew, Jason S, MD       artificial tears ophthalmic solution 1 drop  1 drop Both Eyes QID PRN Marea Selinda RAMAN, MD       bisacodyl  (DULCOLAX) suppository 10 mg  10 mg Rectal Daily PRN Dew, Jason S, MD   10 mg at 11/09/23 0428   Chlorhexidine  Gluconate Cloth 2 % PADS 6 each  6 each Topical Q0600 Marea Selinda RAMAN, MD   6 each at 11/22/23 0532   epoetin  alfa-epbx (RETACRIT ) injection 10,000  Units  10,000 Units Intravenous Q M,W,F-HD Dew, Jason S, MD   10,000 Units at 11/22/23 0906   feeding supplement (NEPRO CARB STEADY) liquid 1,000 mL  1,000 mL Oral Continuous Dew, Jason S, MD 50 mL/hr at 11/21/23 2139 1,000 mL at 11/21/23 2139   feeding supplement (PROSource TF20) liquid 60 mL  60 mL Per Tube Daily Dew, Jason S, MD   60 mL at 11/19/23 1200   free water  30 mL  30 mL Per Tube Q4H Dew, Jason S, MD   30 mL at 11/22/23 0532   gentamicin  ointment (GARAMYCIN ) 0.1 %   Topical TID Dew, Jason S, MD   Given at 11/21/23 2138   heparin  injection 2,500 Units  25 Units/kg Dialysis PRN Druscilla Bald, NP   2,500 Units at 11/22/23 0920   heparin  injection 5,000 Units  5,000 Units Subcutaneous Q8H Dew, Jason S, MD   5,000 Units at 11/22/23 0531   insulin  aspart (novoLOG ) injection 10 Units  10 Units Subcutaneous Once Dew, Jason S, MD       ipratropium-albuterol  (DUONEB) 0.5-2.5 (3) MG/3ML nebulizer solution 3 mL  3 mL Nebulization Q4H PRN Dew, Jason S, MD   3 mL at 11/18/23 0609   multivitamin (RENA-VIT) tablet 1 tablet  1 tablet Per Tube QHS Dew, Jason S, MD   1 tablet at 11/21/23 2139   ondansetron  (ZOFRAN ) injection 4 mg  4 mg Intravenous Q6H PRN Dew, Jason S, MD   4 mg at 10/05/23 1713   Oral care mouth rinse  15 mL Mouth Rinse PRN Marea Selinda RAMAN, MD       pantoprazole  (PROTONIX ) injection 40 mg  40 mg Intravenous Q12H Dew, Jason S, MD   40 mg at 11/21/23 2139   sodium zirconium cyclosilicate  (LOKELMA ) packet 5 g  5 g Per Tube BID Lenon Marien CROME, MD         Discharge Medications: Please see discharge summary for a list of discharge medications.  Relevant Imaging Results:  Relevant Lab Results:   Additional Information SS#: [id number]. New HD. Chair time pending.  Lauraine [name], LCSW

## 2023-11-23 DIAGNOSIS — R6520 Severe sepsis without septic shock: Secondary | ICD-10-CM | POA: Diagnosis not present

## 2023-11-23 DIAGNOSIS — A419 Sepsis, unspecified organism: Secondary | ICD-10-CM | POA: Diagnosis not present

## 2023-11-23 LAB — HEMOGLOBIN AND HEMATOCRIT, BLOOD
HCT: 24.2 % — ABNORMAL LOW (ref 39.0–52.0)
Hemoglobin: 7.8 g/dL — ABNORMAL LOW (ref 13.0–17.0)

## 2023-11-23 LAB — RENAL FUNCTION PANEL
Albumin: 1.5 g/dL — ABNORMAL LOW (ref 3.5–5.0)
Anion gap: 11 (ref 5–15)
BUN: 56 mg/dL — ABNORMAL HIGH (ref 8–23)
CO2: 26 mmol/L (ref 22–32)
Calcium: 7.5 mg/dL — ABNORMAL LOW (ref 8.9–10.3)
Chloride: 95 mmol/L — ABNORMAL LOW (ref 98–111)
Creatinine, Ser: 4.76 mg/dL — ABNORMAL HIGH (ref 0.61–1.24)
GFR, Estimated: 11 mL/min — ABNORMAL LOW (ref >60)
Glucose, Bld: 120 mg/dL — ABNORMAL HIGH (ref 70–99)
Phosphorus: 5.1 mg/dL — ABNORMAL HIGH (ref 2.5–4.6)
Potassium: 4.6 mmol/L (ref 3.5–5.1)
Sodium: 132 mmol/L — ABNORMAL LOW (ref 135–145)

## 2023-11-23 LAB — CBC
HCT: 21.4 % — ABNORMAL LOW (ref 39.0–52.0)
Hemoglobin: 6.8 g/dL — ABNORMAL LOW (ref 13.0–17.0)
MCH: 29.4 pg (ref 26.0–34.0)
MCHC: 31.8 g/dL (ref 30.0–36.0)
MCV: 92.6 fL (ref 80.0–100.0)
Platelets: 334 10*3/uL (ref 150–400)
RBC: 2.31 MIL/uL — ABNORMAL LOW (ref 4.22–5.81)
RDW: 14.7 % (ref 11.5–15.5)
WBC: 17.4 10*3/uL — ABNORMAL HIGH (ref 4.0–10.5)
nRBC: 0.1 % (ref 0.0–0.2)

## 2023-11-23 LAB — GLUCOSE, CAPILLARY
Glucose-Capillary: 125 mg/dL — ABNORMAL HIGH (ref 70–99)
Glucose-Capillary: 116 mg/dL — ABNORMAL HIGH (ref 70–99)
Glucose-Capillary: 124 mg/dL — ABNORMAL HIGH (ref 70–99)

## 2023-11-23 LAB — PREPARE RBC (CROSSMATCH)

## 2023-11-23 MED ORDER — SODIUM CHLORIDE 0.9% IV SOLUTION: Freq: Once | INTRAVENOUS | Status: AC

## 2023-11-23 NOTE — Progress Notes (Signed)
 Physical Therapy Treatment Patient Details Name: Robert Vega MRN: [id number] DOB: [birth year] Today's Date: 11/23/2023   History of Present Illness 83 y.o male with significant PMH of OSA, GERD, Obesity, HTN, Dysphagia: EGD 03/2022 with food in upper esophagus complicated by aspiration event, cardiac arrest with round of CPR, and post resuscitation EGD with concern for lack of peristalsis. Pt presented to ED on 10/05/2023 with hypoxia, fever and generalized weakness; developed acute respiratory failure requiring intubation 5/10 due to aspiration pneumonia, extubated 5/15, but had significant agitation required brief course of Precedex ; pt now s/p exploratory laparotomy with closure of gastrotomy and insertion of gastrostomy tube on 10/22/23.  PT order discontinued by MD 11/10/23 and new PT consult received 11/14/23.  Pt s/p R temporary femoral vein dialysis catheter placement 11/11/23.    PT Comments  Pt was long sitting in bed with feeds running and BUE mitts donned. He is awake but only oriented to self. Needs a little encouragement but overall is cooperative throughout session. Pt has poor insight of situation and his deficits. Requires constant vcing throughout session to focus on desired task and how to perform it. Mitts were removed throughout session but reapplied at conclusion. Feeds held during session but also resumed post session. Pt has severe initial posterior push/rigidity upon sitting up however with max assist was able to improve. He did stand EOB several times from elevated bed height. SOB/fatigue noted with minimal activity. Unsafe to ambulate away form EOB however pt did perform marching in place and taking several steps along EOB. Acute PT will continue to follow and progress per current POC progressing as able per pt tolerance. Pt was in bed with bed alarm set and BUE in mitts post session.      If plan is discharge home, recommend the following: A lot of help with walking and/or  transfers;A lot of help with bathing/dressing/bathroom;Assistance with cooking/housework;Assistance with feeding;Direct supervision/assist for medications management;Direct supervision/assist for financial management;Assist for transportation;Help with stairs or ramp for entrance;Supervision due to cognitive status     Equipment Recommendations  Other (comment) (Defer to next level of care)       Precautions / Restrictions Precautions Precautions: Fall Recall of Precautions/Restrictions: Impaired Precaution/Restrictions Comments: PEG,perm cath (HD) Restrictions Weight Bearing Restrictions Per Provider Order: No     Mobility  Bed Mobility Overal bed mobility: Needs Assistance Bed Mobility: Rolling, Supine to Sit, Sit to Supine Rolling: Max assist Sidelying to sit: Max assist Supine to sit: Max assist, Total assist  General bed mobility comments: pt has severe posterior push when attempting to come to sitting however once short seated EOB, much less severe posterior push observed. max-total to/from EOB sitting however once EOB is able to progress to standing activity.    Transfers Overall transfer level: Needs assistance Equipment used: Rolling walker (2 wheels) Transfers: Sit to/from Stand Sit to Stand: Mod assist, From elevated surface  General transfer comment: pt stoo EOB 3 x to RW. mostly vcing for desired task. pt is easily distracted. does require bed height to be elevated    Ambulation/Gait Ambulation/Gait assistance: Mod assist Gait Distance (Feet): 3 Feet Assistive device: Rolling walker (2 wheels) Gait Pattern/deviations: Step-to pattern Gait velocity: decreased  General Gait Details: Pt was able to alternate marching in place 15 x with max vcing. poor foot clearance but does clear floor each time. Pt was able to take steps to [facility] from FOB with author progressing RW + max vcs. Unsafe to advance ambulation due to pt's impulsivity  and poor cognition.    Balance Overall  balance assessment: Needs assistance Sitting-balance support: Feet supported, Bilateral upper extremity supported Sitting balance-Leahy Scale: Poor Sitting balance - Comments: occasionally supervision only however to achieve sitting takes max assist. severe initial posterior push.   Standing balance support: Bilateral upper extremity supported, During functional activity, Reliant on assistive device for balance Standing balance-Leahy Scale: Poor Standing balance comment: pt remains at high risk of falls due to cognition, impulsivity, and poor awareness of deficits/safety         Communication Communication Communication: Impaired Factors Affecting Communication: Reduced clarity of speech  Cognition Arousal: Lethargic, Suspect due to medications Behavior During Therapy: Impulsive, Restless   PT - Cognitive impairments: History of cognitive impairments   Orientation impairments: Place, Time, Situation      PT - Cognition Comments: pt is only truely oriented to self. Increased time for processing and performing desired task requested of him. constant max cueing for pt to stay focused on desired task Following commands: Impaired Following commands impaired: Follows one step commands inconsistently    Cueing Cueing Techniques: Verbal cues, Gestural cues, Tactile cues, Visual cues         Pertinent Vitals/Pain Pain Assessment Pain Assessment: No/denies pain     PT Goals (current goals can now be found in the care plan section) Acute Rehab PT Goals Patient Stated Goal: none stated Progress towards PT goals: Not progressing toward goals - comment (cognition deficits slowing progress)    Frequency    Min 1X/week       Co-evaluation     PT goals addressed during session: Mobility/safety with mobility;Balance;Proper use of DME;Strengthening/ROM        AM-PAC PT 6 Clicks Mobility   Outcome Measure  Help needed turning from your back to your side while in a flat bed  without using bedrails?: A Lot Help needed moving from lying on your back to sitting on the side of a flat bed without using bedrails?: A Lot Help needed moving to and from a bed to a chair (including a wheelchair)?: A Lot Help needed standing up from a chair using your arms (e.g., wheelchair or bedside chair)?: A Lot Help needed to walk in hospital room?: A Lot Help needed climbing 3-5 steps with a railing? : Total 6 Click Score: 11    End of Session   Activity Tolerance: Patient tolerated treatment well;Patient limited by fatigue Patient left: in bed;with call bell/phone within reach;with bed alarm set;Other (comment) (Mitts reapplied post session. feeds running with HOB elevated > 30 degrees) Nurse Communication: Mobility status;Precautions;Other (comment) PT Visit Diagnosis: Other abnormalities of gait and mobility (R26.89);Muscle weakness (generalized) (M62.81)     Time: 9189-9164 PT Time Calculation (min) (ACUTE ONLY): 25 min  Charges:    $Therapeutic Activity: 23-37 mins PT General Charges $$ ACUTE PT VISIT: 1 Visit                     Rankin Essex PTA 11/23/23, 9:07 AM

## 2023-11-23 NOTE — Progress Notes (Signed)
 Occupational Therapy Treatment Patient Details Name: Robert Vega MRN: [id number] DOB: 08-05-1940 Today's Date: 11/23/2023   History of present illness 83 y.o male with significant PMH of OSA, GERD, Obesity, HTN, Dysphagia: EGD 03/2022 with food in upper esophagus complicated by aspiration event, cardiac arrest with round of CPR, and post resuscitation EGD with concern for lack of peristalsis. Pt presented to ED on 10/05/2023 with hypoxia, fever and generalized weakness; developed acute respiratory failure requiring intubation 5/10 due to aspiration pneumonia, extubated 5/15, but had significant agitation required brief course of Precedex ; pt now s/p exploratory laparotomy with closure of gastrotomy and insertion of gastrostomy tube on 10/22/23.  PT order discontinued by MD 11/10/23 and new PT consult received 11/14/23.  Pt s/p R temporary femoral vein dialysis catheter placement 11/11/23.   OT comments  Upon entering the room, pt supine in bed and sleeping soundly. Tube feeds paused and hand mitts removed for session. Pt briefly speaks to therapist and then falls back asleep. He needs hand over hand assistance to wash hands and face with washcloth this session. Pt continues to be lethargic and unsafe to attempt transfer or OOB task. Pt repositioned in bed and rolls to the R with max A during session. He returns back to sleep and unable to attend further. Tube feeds resumed with pt elevated to 47 degrees and hand mitts donned. Call bell and all needed items within reach and alarm activated.      If plan is discharge home, recommend the following:  Two people to help with walking and/or transfers;Two people to help with bathing/dressing/bathroom   Equipment Recommendations  Other (comment) (defer to next venue of care)       Precautions / Restrictions Precautions Precautions: Fall Precaution/Restrictions Comments: PEG,perm cath (HD) Restrictions Weight Bearing Restrictions Per Provider Order: No        Mobility Bed Mobility Overal bed mobility: Needs Assistance Bed Mobility: Rolling Rolling: Max assist              Transfers                   General transfer comment: unable to attempt secondary to lethargy         ADL either performed or assessed with clinical judgement   ADL Overall ADL's : Needs assistance/impaired     Grooming: Total assistance;Maximal assistance;Wash/dry hands;Wash/dry face Grooming Details (indicate cue type and reason): Mittens removed and pt needing hand over hand assistance to wash hands and face with washcloth                                    Extremity/Trunk Assessment Upper Extremity Assessment Upper Extremity Assessment: Generalized weakness            Vision Baseline Vision/History: 1 Wears glasses Patient Visual Report: No change from baseline           Communication Communication Communication: Impaired Factors Affecting Communication: Reduced clarity of speech   Cognition Arousal: Lethargic, Suspect due to medications   Cognition: Cognition impaired                               Following commands: Impaired Following commands impaired: Follows one step commands inconsistently                    Pertinent Vitals/ Pain  Pain Assessment Pain Assessment: Faces Faces Pain Scale: No hurt         Frequency  Min 1X/week        Progress Toward Goals  OT Goals(current goals can now be found in the care plan section)  Progress towards OT goals: Progressing toward goals         Co-evaluation        PT goals addressed during session: Mobility/safety with mobility;Balance;Proper use of DME;Strengthening/ROM        AM-PAC OT 6 Clicks Daily Activity     Outcome Measure   Help from another person eating meals?: A Lot Help from another person taking care of personal grooming?: A Lot Help from another person toileting, which includes using toliet,  bedpan, or urinal?: A Lot Help from another person bathing (including washing, rinsing, drying)?: A Lot Help from another person to put on and taking off regular upper body clothing?: A Lot Help from another person to put on and taking off regular lower body clothing?: A Lot 6 Click Score: 12    End of Session    OT Visit Diagnosis: Unsteadiness on feet (R26.81);Repeated falls (R29.6);Muscle weakness (generalized) (M62.81)   Activity Tolerance Patient limited by fatigue   Patient Left in bed;with call bell/phone within reach;with bed alarm set;with nursing/sitter in room   Nurse Communication          Time: 9097-9086 OT Time Calculation (min): 11 min  Charges: OT General Charges $OT Visit: 1 Visit OT Treatments $Self Care/Home Management : 8-22 mins  Izetta Claude, MS, OTR/L , CBIS ascom [phone]  11/23/23, 9:17 AM

## 2023-11-23 NOTE — Progress Notes (Signed)
 PROGRESS NOTE    Robert Vega  FMW:[id number] DOB: 1940/08/27 DOA: 10/05/2023 PCP: Jimmy Charlie FERNS, MD    Assessment & Plan:   Principal Problem:   Severe sepsis [facility]) Active Problems:   Gastrostomy complication (HCC)   Dysphagia   Achalasia of esophagus   AKI (acute kidney injury) (HCC)   Acute delirium   Acute respiratory failure with hypoxia and hypercapnia (HCC)   Essential hypertension, benign   GERD (gastroesophageal reflux disease)   Encounter for dialysis and dialysis catheter care [facility])   Multifocal pneumonia   History of dysphagia   Aspiration pneumonia (HCC)   Obesity (BMI 30-39.9)   Generalized weakness   Generalized abdominal pain   Malnutrition of moderate degree   Melena  Assessment and Plan: AKI: secondary to ATN as per nephro. now on HD. HD as per nephro. Nephro following and recs apprec   Hyperkalemia: WNL today    Possible ileus: resolved.   ACD: likely secondary to CKD. S/p 1 unit of pRBCs transfused so far. Will give 1 unit more today for Hb 6.8. Repeat H&H ordered     Achalasia & dysphagia: w/ high aspiration risk. S/p botulinum toxin injection on 5/20, several EGD and SLP evals. Not safe for PO intake unless family wants to accept risk that he will aspirate again. Continue w/ tube feeds.    Severe sepsis: resolved. See Dr. Lamount note on how pt met severe sepsis criteria. Initially w/ aspiration pna. Later in hospital stay w/ intra-abdominal infection secondary to PEG dislodged  Acute hypoxic & hypercapnic respiratory failure: s/p intubation, ventilation & extubation. Continue on supplemental oxygen and wean as tolerated    Chronic pEF CHF: appears compensated. Fluid/volume management w/ HD    Delirium: vs mild cognitive impairment vs anoxic brain injury during critical illness in the ICU, requiring intubation & pressors. Continue w/ supportive care. Granddaughter asked to d/c prn antipsychotics. Continue w/ tele sitter    GERD:  continue on PPI    HTN: BP is WNL currently.     Generalized weakness: PT/OT recs SNF   Obesity: BMI 32.8. Would benefit from weight loss     DVT prophylaxis: heparin   Code Status:  full  Family Communication: discussed pt's care w/ pt's granddaughter, [name], and answered her questions  Disposition Plan: likely d/c to SNF  Level of care: Med-Surg  Status is: Inpatient Remains inpatient appropriate because: needs SNF placement     Consultants:  Nephro GI  ICU Palliative care  Gen surg  Procedures:  Antimicrobials:    Subjective: Pt c/o being sick  Objective: Vitals:   11/22/23 1945 11/23/23 0334 11/23/23 0352 11/23/23 0846  BP: 128/65 (!) 115/57  (!) 113/56  Pulse: 94 87  80  Resp: 16 (!) 24 20 20   Temp: 98.9 F (37.2 C) 98.9 F (37.2 C)  98.8 F (37.1 C)  TempSrc: Oral Oral    SpO2: 93% 93%  91%  Weight:      Height:        Intake/Output Summary (Last 24 hours) at 11/23/2023 0909 Last data filed at 11/22/2023 1344 Gross per 24 hour  Intake --  Output 1500 ml  Net -1500 ml   Filed Weights   11/19/23 0345 11/20/23 0821 11/20/23 1410  Weight: 102.6 kg 103.6 kg 100.9 kg    Examination:  General exam: Appears calm & comfortable  Respiratory system: decreased breath sounds b/l Cardiovascular system: S1 & S2+. No rubs, gallops or clicks.  Gastrointestinal system: Abdomen is  obese, soft and nontender. Hypoactive bowel sounds heard. PEG tube in place Central nervous system: Alert and oriented to self & month only.  Psychiatry: Judgement and insight appears poor. Flat mood and affect     Data Reviewed: I have personally reviewed following labs and imaging studies  CBC: Recent Labs  Lab 11/20/23 0432 11/21/23 0358 11/22/23 0451 11/22/23 1712 11/23/23 0415  WBC 16.1* 14.7* 15.3* 16.7* 17.4*  HGB 7.5* 7.3* 7.2* 7.5* 6.8*  HCT 24.0* 23.5* 22.9* 23.8* 21.4*  MCV 93.4 95.5 92.7 94.1 92.6  PLT 370 325 356 330 334   Basic Metabolic  Panel: Recent Labs  Lab 11/18/23 0800 11/19/23 0412 11/19/23 1248 11/20/23 0755 11/21/23 0358 11/22/23 0451 11/23/23 0415  NA 134* 135 133* 133* 134* 135 132*  K 6.3* 6.1* 6.0* 6.3* 5.1 5.3* 4.6  CL 97* 98 95* 96* 96* 97* 95*  CO2 26 28 26 25 28 26 26   GLUCOSE 138* 133* 126* 119* 130* 117* 120*  BUN 77* 54* 62* 77* 53* 73* 56*  CREATININE 8.39* 5.87* 6.47* 7.74* 5.48* 6.96* 4.76*  CALCIUM  7.0* 7.0* 7.2* 7.3* 7.5* 7.7* 7.5*  PHOS 8.4* 6.6*  --  7.9*  --   --  5.1*   GFR: Estimated Creatinine Clearance: 13.5 mL/min (A) (by C-G formula based on SCr of 4.76 mg/dL (H)). Liver Function Tests: Recent Labs  Lab 11/18/23 0800 11/19/23 0412 11/20/23 0755 11/23/23 0415  ALBUMIN 1.6* 1.6* 1.6* 1.5*   No results for input(s): LIPASE, AMYLASE in the last 168 hours. No results for input(s): AMMONIA in the last 168 hours. Coagulation Profile: No results for input(s): INR, PROTIME in the last 168 hours. Cardiac Enzymes: No results for input(s): CKTOTAL, CKMB, CKMBINDEX, TROPONINI in the last 168 hours. BNP (last 3 results) No results for input(s): PROBNP in the last 8760 hours. HbA1C: No results for input(s): HGBA1C in the last 72 hours. CBG: Recent Labs  Lab 11/20/23 1641 11/22/23 0723 11/22/23 1435 11/22/23 1623 11/23/23 0843  GLUCAP 126* 123* 116* 110* 125*   Lipid Profile: No results for input(s): CHOL, HDL, LDLCALC, TRIG, CHOLHDL, LDLDIRECT in the last 72 hours. Thyroid  Function Tests: No results for input(s): TSH, T4TOTAL, FREET4, T3FREE, THYROIDAB in the last 72 hours. Anemia Panel: No results for input(s): VITAMINB12, FOLATE, FERRITIN, TIBC, IRON, RETICCTPCT in the last 72 hours. Sepsis Labs: No results for input(s): PROCALCITON, LATICACIDVEN in the last 168 hours.  No results found for this or any previous visit (from the past 240 hours).       Radiology Studies: PERIPHERAL VASCULAR  CATHETERIZATION Result Date: 11/21/2023 See surgical note for result.       Scheduled Meds:  sodium chloride    Intravenous Once   Chlorhexidine  Gluconate Cloth  6 each Topical Q0600   epoetin  alfa-epbx (RETACRIT ) injection  10,000 Units Intravenous Q M,W,F-HD   feeding supplement (PROSource TF20)  60 mL Per Tube Daily   free water   60 mL Per Tube Q4H   gentamicin  ointment   Topical TID   heparin  injection (subcutaneous)  5,000 Units Subcutaneous Q8H   insulin  aspart  10 Units Subcutaneous Once   multivitamin  1 tablet Per Tube QHS   pantoprazole  (PROTONIX ) IV  40 mg Intravenous Q12H   sodium zirconium cyclosilicate   5 g Per Tube BID   Continuous Infusions:  anticoagulant sodium citrate     feeding supplement (NEPRO CARB STEADY) 1,000 mL (11/23/23 0406)     LOS: 49 days      Anthony  CHRISTELLA Pouch, MD [facility] Pager [phone]  If 7PM-7AM, please contact night-coverage [email or web address] 11/23/2023, 9:09 AM

## 2023-11-23 NOTE — Progress Notes (Signed)
 [facility]  ROUNDING NOTE   Subjective:   Patient resting quietly No family present Tube feeds infusing at 39ml/hr No lower extremity edema    Objective:  Vital signs in last 24 hours:  Temp:  [97.3 F (36.3 C)-98.9 F (37.2 C)] 98.8 F (37.1 C) (06/28 0846) Pulse Rate:  [77-94] 80 (06/28 0846) Resp:  [16-29] 20 (06/28 0846) BP: (103-128)/(45-66) 113/56 (06/28 0846) SpO2:  [91 %-99 %] 91 % (06/28 0846)  Weight change:  Filed Weights   11/19/23 0345 11/20/23 0821 11/20/23 1410  Weight: 102.6 kg 103.6 kg 100.9 kg    Intake/Output: I/O last 3 completed shifts: In: -  Out: 1500 [Other:1500]   Intake/Output this shift:  No intake/output data recorded.  Physical Exam: General: Chronically ill-appearing  Head: Oral mucosa moist  Eyes: Anicteric  Lungs:  Diminished  Heart: Regular rate and rhythm  Abdomen:  Peg tube in place   Extremities: Trace peripheral edema.  Neurologic: Alert and oriented to self  Skin: No lesions  Access: Rt internal jugular permcath    Basic Metabolic Panel: Recent Labs  Lab 11/18/23 0800 11/19/23 0412 11/19/23 1248 11/20/23 0755 11/21/23 0358 11/22/23 0451 11/23/23 0415  NA 134* 135 133* 133* 134* 135 132*  K 6.3* 6.1* 6.0* 6.3* 5.1 5.3* 4.6  CL 97* 98 95* 96* 96* 97* 95*  CO2 26 28 26 25 28 26 26   GLUCOSE 138* 133* 126* 119* 130* 117* 120*  BUN 77* 54* 62* 77* 53* 73* 56*  CREATININE 8.39* 5.87* 6.47* 7.74* 5.48* 6.96* 4.76*  CALCIUM  7.0* 7.0* 7.2* 7.3* 7.5* 7.7* 7.5*  PHOS 8.4* 6.6*  --  7.9*  --   --  5.1*    Liver Function Tests: Recent Labs  Lab 11/18/23 0800 11/19/23 0412 11/20/23 0755 11/23/23 0415  ALBUMIN 1.6* 1.6* 1.6* 1.5*   No results for input(s): LIPASE, AMYLASE in the last 168 hours. No results for input(s): AMMONIA in the last 168 hours.  CBC: Recent Labs  Lab 11/20/23 0432 11/21/23 0358 11/22/23 0451 11/22/23 1712 11/23/23 0415  WBC 16.1* 14.7* 15.3* 16.7* 17.4*  HGB  7.5* 7.3* 7.2* 7.5* 6.8*  HCT 24.0* 23.5* 22.9* 23.8* 21.4*  MCV 93.4 95.5 92.7 94.1 92.6  PLT 370 325 356 330 334    Cardiac Enzymes: No results for input(s): CKTOTAL, CKMB, CKMBINDEX, TROPONINI in the last 168 hours.  BNP: Invalid input(s): POCBNP  CBG: Recent Labs  Lab 11/20/23 1641 11/22/23 0723 11/22/23 1435 11/22/23 1623 11/23/23 0843  GLUCAP 126* 123* 116* 110* 125*    Microbiology: Results for orders placed or performed during the hospital encounter of 10/05/23  Blood Culture (routine x 2)     Status: None   Collection Time: 10/05/23  5:06 AM   Specimen: BLOOD  Result Value Ref Range Status   Specimen Description BLOOD LA  Final   Special Requests   Final    BOTTLES DRAWN AEROBIC AND ANAEROBIC Blood Culture results may not be optimal due to an inadequate volume of blood received in culture bottles   Culture   Final    NO GROWTH 5 DAYS Performed at [facility], [address] [place], [place] [address]    Report Status 10/10/2023 FINAL  Final  Blood Culture (routine x 2)     Status: None   Collection Time: 10/05/23  5:07 AM   Specimen: BLOOD  Result Value Ref Range Status   Specimen Description BLOOD RA  Final   Special Requests  Final    BOTTLES DRAWN AEROBIC AND ANAEROBIC Blood Culture results may not be optimal due to an inadequate volume of blood received in culture bottles   Culture   Final    NO GROWTH 5 DAYS Performed at [facility], [address] [place], [place] [address]    Report Status 10/10/2023 FINAL  Final  Resp panel by RT-PCR (RSV, Flu A&B, Covid) Anterior Nasal Swab     Status: None   Collection Time: 10/05/23  5:56 AM   Specimen: Anterior Nasal Swab  Result Value Ref Range Status   SARS Coronavirus 2 by RT PCR NEGATIVE NEGATIVE Final    Comment: (NOTE) SARS-CoV-2 target nucleic acids are NOT DETECTED.  The SARS-CoV-2 RNA is generally detectable in upper respiratory specimens during the acute phase  of infection. The lowest concentration of SARS-CoV-2 viral copies this assay can detect is 138 copies/mL. A negative result does not preclude SARS-Cov-2 infection and should not be used as the sole basis for treatment or other patient management decisions. A negative result may occur with  improper specimen collection/handling, submission of specimen other than nasopharyngeal swab, presence of viral mutation(s) within the areas targeted by this assay, and inadequate number of viral copies(<138 copies/mL). A negative result must be combined with clinical observations, patient history, and epidemiological information. The expected result is Negative.  Fact Sheet for Patients:  BloggerCourse.com  Fact Sheet for Healthcare Providers:  SeriousBroker.it  This test is no t yet approved or cleared by the [place]  FDA and  has been authorized for detection and/or diagnosis of SARS-CoV-2 by FDA under an Emergency Use Authorization (EUA). This EUA will remain  in effect (meaning this test can be used) for the duration of the COVID-19 declaration under Section 564(b)(1) of the Act, 21 U.S.C.section 360bbb-3(b)(1), unless the authorization is terminated  or revoked sooner.       Influenza A by PCR NEGATIVE NEGATIVE Final   Influenza B by PCR NEGATIVE NEGATIVE Final    Comment: (NOTE) The Xpert Xpress SARS-CoV-2/FLU/RSV plus assay is intended as an aid in the diagnosis of influenza from Nasopharyngeal swab specimens and should not be used as a sole basis for treatment. Nasal washings and aspirates are unacceptable for Xpert Xpress SARS-CoV-2/FLU/RSV testing.  Fact Sheet for Patients: BloggerCourse.com  Fact Sheet for Healthcare Providers: SeriousBroker.it  This test is not yet approved or cleared by the [place]  FDA and has been authorized for detection and/or diagnosis of  SARS-CoV-2 by FDA under an Emergency Use Authorization (EUA). This EUA will remain in effect (meaning this test can be used) for the duration of the COVID-19 declaration under Section 564(b)(1) of the Act, 21 U.S.C. section 360bbb-3(b)(1), unless the authorization is terminated or revoked.     Resp Syncytial Virus by PCR NEGATIVE NEGATIVE Final    Comment: (NOTE) Fact Sheet for Patients: BloggerCourse.com  Fact Sheet for Healthcare Providers: SeriousBroker.it  This test is not yet approved or cleared by the [place]  FDA and has been authorized for detection and/or diagnosis of SARS-CoV-2 by FDA under an Emergency Use Authorization (EUA). This EUA will remain in effect (meaning this test can be used) for the duration of the COVID-19 declaration under Section 564(b)(1) of the Act, 21 U.S.C. section 360bbb-3(b)(1), unless the authorization is terminated or revoked.  Performed at [facility], [address], [place], [place] [address]   Respiratory (~20 pathogens) panel by PCR     Status: None   Collection Time: 10/05/23  8:11 AM   Specimen: Nasopharyngeal Swab; Respiratory  Result Value Ref Range Status   Adenovirus NOT DETECTED NOT DETECTED Final   Coronavirus 229E NOT DETECTED NOT DETECTED Final    Comment: (NOTE) The Coronavirus on the Respiratory Panel, DOES NOT test for the novel  Coronavirus (2019 nCoV)    Coronavirus HKU1 NOT DETECTED NOT DETECTED Final   Coronavirus NL63 NOT DETECTED NOT DETECTED Final   Coronavirus OC43 NOT DETECTED NOT DETECTED Final   Metapneumovirus NOT DETECTED NOT DETECTED Final   Rhinovirus / Enterovirus NOT DETECTED NOT DETECTED Final   Influenza A NOT DETECTED NOT DETECTED Final   Influenza B NOT DETECTED NOT DETECTED Final   Parainfluenza Virus 1 NOT DETECTED NOT DETECTED Final   Parainfluenza Virus 2 NOT DETECTED NOT DETECTED Final   Parainfluenza Virus 3 NOT DETECTED NOT  DETECTED Final   Parainfluenza Virus 4 NOT DETECTED NOT DETECTED Final   Respiratory Syncytial Virus NOT DETECTED NOT DETECTED Final   Bordetella pertussis NOT DETECTED NOT DETECTED Final   Bordetella Parapertussis NOT DETECTED NOT DETECTED Final   Chlamydophila pneumoniae NOT DETECTED NOT DETECTED Final   Mycoplasma pneumoniae NOT DETECTED NOT DETECTED Final    Comment: Performed at [facility], [address], [place], [place] [address]  Expectorated Sputum Assessment w Gram Stain, Rflx to Resp Cult     Status: None   Collection Time: 10/05/23  9:07 AM   Specimen: Sputum  Result Value Ref Range Status   Specimen Description SPUTUM  Final   Special Requests NONE  Final   Sputum evaluation   Final    Sputum specimen not acceptable for testing.  Please recollect.   C/KERRY NELSON AT 1005 10/05/23.PMF Performed at [facility], [address] [place], [place] [address]    Report Status 10/05/2023 FINAL  Final  Expectorated Sputum Assessment w Gram Stain, Rflx to Resp Cult     Status: None   Collection Time: 10/05/23 10:50 AM  Result Value Ref Range Status   Specimen Description EXPECTORATED SPUTUM  Final   Special Requests NONE  Final   Sputum evaluation   Final    THIS SPECIMEN IS ACCEPTABLE FOR SPUTUM CULTURE Performed at [facility], [address], [place], [place] [address]    Report Status 10/05/2023 FINAL  Final  Culture, Respiratory w Gram Stain     Status: None   Collection Time: 10/05/23 10:50 AM  Result Value Ref Range Status   Specimen Description   Final    EXPECTORATED SPUTUM Performed at [facility], [address], [place], [place] [address]    Special Requests   Final    NONE Reflexed from [id number] Performed at [facility], [address] [place], [place] [address]    Gram Stain   Final    RARE WBC SEEN RARE VONNE POSITIVE RODS RARE VONNE POSITIVE COCCI RARE GRAM NEGATIVE RODS    Culture   Final    FEW  Normal respiratory flora-no Staph aureus or Pseudomonas seen Performed at [facility], [address], [place], [place] [address]    Report Status 10/07/2023 FINAL  Final  MRSA Next Gen by PCR, Nasal     Status: None   Collection Time: 10/06/23 12:57 AM   Specimen: Nasal Mucosa; Nasal Swab  Result Value Ref Range Status   MRSA by PCR Next Gen NOT DETECTED NOT DETECTED Final    Comment: (NOTE) The GeneXpert MRSA Assay (FDA approved for  NASAL specimens only), is one component of a comprehensive MRSA colonization surveillance program. It is not intended to diagnose MRSA infection nor to guide or monitor treatment for MRSA infections. Test performance is not FDA approved in patients less than 27 years old. Performed at [facility], [address] [place], [place] [address]   Culture, Respiratory w Gram Stain     Status: None   Collection Time: 10/06/23 11:37 AM   Specimen: INDUCED SPUTUM  Result Value Ref Range Status   Specimen Description   Final    INDUCED SPUTUM Performed at [facility], [address] [place], [place] [address]    Special Requests   Final    NONE Performed at [facility], [address] [place], [place] [address]    Gram Stain   Final    FEW WBC PRESENT,BOTH PMN AND MONONUCLEAR FEW GRAM POSITIVE RODS    Culture   Final    MODERATE LACTOBACILLUS FERMENTUM Standardized susceptibility testing for this organism is not available. Performed at [facility], [address], [place], [place] [address]    Report Status 10/09/2023 FINAL  Final  Culture, blood (x 2)     Status: None   Collection Time: 10/22/23  1:00 AM   Specimen: BLOOD  Result Value Ref Range Status   Specimen Description BLOOD BLOOD RIGHT ARM  Final   Special Requests   Final    BOTTLES DRAWN AEROBIC AND ANAEROBIC Blood Culture adequate volume   Culture   Final    NO GROWTH 5 DAYS Performed at [facility], [address],  [place], [place] [address]    Report Status 10/27/2023 FINAL  Final  Culture, blood (x 2)     Status: None   Collection Time: 10/22/23  1:00 AM   Specimen: BLOOD  Result Value Ref Range Status   Specimen Description BLOOD BLOOD RIGHT ARM  Final   Special Requests   Final    BOTTLES DRAWN AEROBIC AND ANAEROBIC Blood Culture adequate volume   Culture   Final    NO GROWTH 5 DAYS Performed at [facility], [address] [place], [place] [address]    Report Status 10/27/2023 FINAL  Final  Resp panel by RT-PCR (RSV, Flu A&B, Covid) Anterior Nasal Swab     Status: None   Collection Time: 11/09/23  5:31 PM   Specimen: Anterior Nasal Swab  Result Value Ref Range Status   SARS Coronavirus 2 by RT PCR NEGATIVE NEGATIVE Final    Comment: (NOTE) SARS-CoV-2 target nucleic acids are NOT DETECTED.  The SARS-CoV-2 RNA is generally detectable in upper respiratory specimens during the acute phase of infection. The lowest concentration of SARS-CoV-2 viral copies this assay can detect is 138 copies/mL. A negative result does not preclude SARS-Cov-2 infection and should not be used as the sole basis for treatment or other patient management decisions. A negative result may occur with  improper specimen collection/handling, submission of specimen other than nasopharyngeal swab, presence of viral mutation(s) within the areas targeted by this assay, and inadequate number of viral copies(<138 copies/mL). A negative result must be combined with clinical observations, patient history, and epidemiological information. The expected result is Negative.  Fact Sheet for Patients:  BloggerCourse.com  Fact Sheet for Healthcare Providers:  SeriousBroker.it  This test is no t yet approved or cleared by the [place]  FDA and  has been authorized for detection and/or diagnosis of SARS-CoV-2 by FDA under an Emergency  Use Authorization (EUA). This EUA  will remain  in effect (meaning this test can be used) for the duration of the COVID-19 declaration under Section 564(b)(1) of the Act, 21 U.S.C.section 360bbb-3(b)(1), unless the authorization is terminated  or revoked sooner.       Influenza A by PCR NEGATIVE NEGATIVE Final   Influenza B by PCR NEGATIVE NEGATIVE Final    Comment: (NOTE) The Xpert Xpress SARS-CoV-2/FLU/RSV plus assay is intended as an aid in the diagnosis of influenza from Nasopharyngeal swab specimens and should not be used as a sole basis for treatment. Nasal washings and aspirates are unacceptable for Xpert Xpress SARS-CoV-2/FLU/RSV testing.  Fact Sheet for Patients: BloggerCourse.com  Fact Sheet for Healthcare Providers: SeriousBroker.it  This test is not yet approved or cleared by the [place]  FDA and has been authorized for detection and/or diagnosis of SARS-CoV-2 by FDA under an Emergency Use Authorization (EUA). This EUA will remain in effect (meaning this test can be used) for the duration of the COVID-19 declaration under Section 564(b)(1) of the Act, 21 U.S.C. section 360bbb-3(b)(1), unless the authorization is terminated or revoked.     Resp Syncytial Virus by PCR NEGATIVE NEGATIVE Final    Comment: (NOTE) Fact Sheet for Patients: BloggerCourse.com  Fact Sheet for Healthcare Providers: SeriousBroker.it  This test is not yet approved or cleared by the [place]  FDA and has been authorized for detection and/or diagnosis of SARS-CoV-2 by FDA under an Emergency Use Authorization (EUA). This EUA will remain in effect (meaning this test can be used) for the duration of the COVID-19 declaration under Section 564(b)(1) of the Act, 21 U.S.C. section 360bbb-3(b)(1), unless the authorization is terminated or revoked.  Performed at [facility], [address] [place], [place]  [address]   Respiratory (~20 pathogens) panel by PCR     Status: None   Collection Time: 11/09/23  5:31 PM   Specimen: Nasopharyngeal Swab; Respiratory  Result Value Ref Range Status   Adenovirus NOT DETECTED NOT DETECTED Final   Coronavirus 229E NOT DETECTED NOT DETECTED Final    Comment: (NOTE) The Coronavirus on the Respiratory Panel, DOES NOT test for the novel  Coronavirus (2019 nCoV)    Coronavirus HKU1 NOT DETECTED NOT DETECTED Final   Coronavirus NL63 NOT DETECTED NOT DETECTED Final   Coronavirus OC43 NOT DETECTED NOT DETECTED Final   Metapneumovirus NOT DETECTED NOT DETECTED Final   Rhinovirus / Enterovirus NOT DETECTED NOT DETECTED Final   Influenza A NOT DETECTED NOT DETECTED Final   Influenza B NOT DETECTED NOT DETECTED Final   Parainfluenza Virus 1 NOT DETECTED NOT DETECTED Final   Parainfluenza Virus 2 NOT DETECTED NOT DETECTED Final   Parainfluenza Virus 3 NOT DETECTED NOT DETECTED Final   Parainfluenza Virus 4 NOT DETECTED NOT DETECTED Final   Respiratory Syncytial Virus NOT DETECTED NOT DETECTED Final   Bordetella pertussis NOT DETECTED NOT DETECTED Final   Bordetella Parapertussis NOT DETECTED NOT DETECTED Final   Chlamydophila pneumoniae NOT DETECTED NOT DETECTED Final   Mycoplasma pneumoniae NOT DETECTED NOT DETECTED Final    Comment: Performed at [facility], [address] [place], [place] [address]  Culture, blood (Routine X 2) w Reflex to ID Panel     Status: None   Collection Time: 11/09/23  6:16 PM   Specimen: BLOOD  Result Value Ref Range Status   Specimen Description   Final    BLOOD RIGHT ANTECUBITAL Performed at [facility], [address] [place],  [place] [address]    Special Requests   Final    BOTTLES DRAWN AEROBIC ONLY Blood Culture adequate volume Performed at [facility], [address], [place], [place] [address]    Culture   Final    NO GROWTH 11 DAYS Performed at [facility], [address],  [place], [place] [address]    Report Status 11/20/2023 FINAL  Final  Culture, blood (Routine X 2) w Reflex to ID Panel     Status: None   Collection Time: 11/09/23  6:23 PM   Specimen: BLOOD  Result Value Ref Range Status   Specimen Description   Final    BLOOD BLOOD LEFT FOREARM Performed at [facility], [address], [place], [place] [address]    Special Requests   Final    BOTTLES DRAWN AEROBIC AND ANAEROBIC Blood Culture adequate volume Performed at [facility], [address] [place], [place] [address]    Culture   Final    NO GROWTH 11 DAYS Performed at [facility], [address], [place], [place] [address]    Report Status 11/20/2023 FINAL  Final  Urine Culture     Status: Abnormal   Collection Time: 11/10/23  1:58 AM   Specimen: Urine, Random  Result Value Ref Range Status   Specimen Description   Final    URINE, RANDOM Performed at [facility], [address] [place], [place] [address]    Special Requests   Final    NONE Reflexed from [id number] Performed at [facility], [address] [place], [place] [address]    Culture 60,000 COLONIES/mL ENTEROCOCCUS FAECALIS (A)  Final   Report Status 11/12/2023 FINAL  Final   Organism ID, Bacteria ENTEROCOCCUS FAECALIS (A)  Final      Susceptibility   Enterococcus faecalis - MIC*    AMPICILLIN  <=2 SENSITIVE Sensitive     NITROFURANTOIN <=16 SENSITIVE Sensitive     VANCOMYCIN 1 SENSITIVE Sensitive     * 60,000 COLONIES/mL ENTEROCOCCUS FAECALIS    Coagulation Studies: No results for input(s): LABPROT, INR in the last 72 hours.  Urinalysis: No results for input(s): COLORURINE, LABSPEC, PHURINE, GLUCOSEU, HGBUR, BILIRUBINUR, KETONESUR, PROTEINUR, UROBILINOGEN, NITRITE, LEUKOCYTESUR in the last 72 hours.  Invalid input(s): APPERANCEUR     Imaging: PERIPHERAL VASCULAR CATHETERIZATION Result Date: 11/21/2023 See surgical note for  result.      Medications:    anticoagulant sodium citrate     feeding supplement (NEPRO CARB STEADY) 1,000 mL (11/23/23 0406)    sodium chloride    Intravenous Once   sodium chloride    Intravenous Once   Chlorhexidine  Gluconate Cloth  6 each Topical Q0600   epoetin  alfa-epbx (RETACRIT ) injection  10,000 Units Intravenous Q M,W,F-HD   feeding supplement (PROSource TF20)  60 mL Per Tube Daily   free water   60 mL Per Tube Q4H   gentamicin  ointment   Topical TID   heparin  injection (subcutaneous)  5,000 Units Subcutaneous Q8H   insulin  aspart  10 Units Subcutaneous Once   multivitamin  1 tablet Per Tube QHS   pantoprazole  (PROTONIX ) IV  40 mg Intravenous Q12H   acetaminophen  **OR** acetaminophen , alteplase , anticoagulant sodium citrate, artificial tears, bisacodyl , ipratropium-albuterol , [DISCONTINUED] ondansetron  **OR** ondansetron  (ZOFRAN ) IV, mouth rinse  Assessment/ Plan:  Mr. Robert Vega is a 83 y.o.  male with obstructive sleep apnea, GERD, obesity, hypertension, dysphagia, severe esophageal dysmotility with achalasia status post PEG tube placement, recent aspiration pneumonia acute respiratory  failure status post extubation 10/09/2023, who was admitted to [facility] on 10/05/2023 for Aspiration into respiratory tract, initial encounter [T17.908A] Severe sepsis (HCC) [A41.9, R65.20] History of dysphagia [Z87.898] Fever, unspecified fever cause [R50.9] Multifocal pneumonia [J18.9]   1.  Acute kidney injury.  Baseline creatinine 0.81 from 10/14/2023.  Acute kidney injury secondary to ATN, obstructive uropathy, and progression of prerenal azotemia.   Patient is critically ill.  He was started on urgent hemodialysis for severe hyperkalemia and uremia. - Dialysis received yesterday with patient seated in chair.  Patient tolerated treatment well. - Vascular surgery placed PermCath on 6/26.  Receiving nursing removing right HD femoral temp cath. -Next dialysis treatment scheduled for  Monday - TOC dialysis coordinator notified of need for outpatient dialysis clinic.  Search in progress. -After speaking with granddaughter, will increase free water  flushes to 60 mL every 4 hours this weekend and assess patient tolerance.  Nursing instructed to document all urine output.   2.  Severe hyperkalemia-potassium corrected to 4.6 today.   3.  Sepsis due to aspiration pneumonia, found to have distal esophageal obstruction with severe dysmotility.  He is status post botulinum toxin injection to the lower esophageal sphincter.  PEG tube placed this admission. Receiving continuous tube feeds.   4.Anemia in the setting of renal failure Lab Results  Component Value Date   HGB 6.8 (L) 11/23/2023  Hemoglobin 6.8.  Continue Epogen  10,000 units IV with dialysis.  Primary team has ordered blood transfusion today.   5.  Bilateral hydronephrosis Noted on CT.Urology team has evaluated the patient and it is thought to be secondary to reflux.  No intervention at this time due to fragile health.     LOS: 49 Robert Vega 6/28/202510:55 AM

## 2023-11-24 DIAGNOSIS — N179 Acute kidney failure, unspecified: Secondary | ICD-10-CM | POA: Diagnosis not present

## 2023-11-24 LAB — GLUCOSE, CAPILLARY
Glucose-Capillary: 128 mg/dL — ABNORMAL HIGH (ref 70–99)
Glucose-Capillary: 117 mg/dL — ABNORMAL HIGH (ref 70–99)
Glucose-Capillary: 128 mg/dL — ABNORMAL HIGH (ref 70–99)

## 2023-11-24 LAB — CBC
HCT: 24.2 % — ABNORMAL LOW (ref 39.0–52.0)
Hemoglobin: 7.8 g/dL — ABNORMAL LOW (ref 13.0–17.0)
MCH: 29 pg (ref 26.0–34.0)
MCHC: 32.2 g/dL (ref 30.0–36.0)
MCV: 90 fL (ref 80.0–100.0)
Platelets: 330 10*3/uL (ref 150–400)
RBC: 2.69 MIL/uL — ABNORMAL LOW (ref 4.22–5.81)
RDW: 15.1 % (ref 11.5–15.5)
WBC: 16.7 10*3/uL — ABNORMAL HIGH (ref 4.0–10.5)
nRBC: 0 % (ref 0.0–0.2)

## 2023-11-24 LAB — BASIC METABOLIC PANEL WITH GFR
Anion gap: 11 (ref 5–15)
BUN: 85 mg/dL — ABNORMAL HIGH (ref 8–23)
CO2: 24 mmol/L (ref 22–32)
Calcium: 7.4 mg/dL — ABNORMAL LOW (ref 8.9–10.3)
Chloride: 94 mmol/L — ABNORMAL LOW (ref 98–111)
Creatinine, Ser: 6.51 mg/dL — ABNORMAL HIGH (ref 0.61–1.24)
GFR, Estimated: 8 mL/min — ABNORMAL LOW (ref >60)
Glucose, Bld: 121 mg/dL — ABNORMAL HIGH (ref 70–99)
Potassium: 4.8 mmol/L (ref 3.5–5.1)
Sodium: 129 mmol/L — ABNORMAL LOW (ref 135–145)

## 2023-11-24 LAB — TYPE AND SCREEN
ABO/RH(D): O POS
Antibody Screen: NEGATIVE
Unit division: 0

## 2023-11-24 LAB — BPAM RBC
Blood Product Expiration Date: 202507252359
ISSUE DATE / TIME: 202506281111
Unit Type and Rh: 5100

## 2023-11-24 MED ORDER — HEPARIN SODIUM (PORCINE) 1000 UNIT/ML DIALYSIS: 25.0000 [IU]/kg | INTRAMUSCULAR | Status: DC | PRN

## 2023-11-24 NOTE — Progress Notes (Signed)
 [facility]  ROUNDING NOTE   Subjective:   Patient seen laying in bed No family present Green mitts in place Incoherent speech Tube feeds in place   Objective:  Vital signs in last 24 hours:  Temp:  [97.7 F (36.5 C)-98.9 F (37.2 C)] 98.9 F (37.2 C) (06/29 0751) Pulse Rate:  [76-90] 90 (06/29 0751) Resp:  [18-25] 25 (06/29 0751) BP: (121-145)/(52-73) 145/73 (06/29 0751) SpO2:  [91 %-96 %] 95 % (06/29 0751)  Weight change:  Filed Weights   11/19/23 0345 11/20/23 0821 11/20/23 1410  Weight: 102.6 kg 103.6 kg 100.9 kg    Intake/Output: I/O last 3 completed shifts: In: 1758 [Blood:318; NG/GT:1440] Out: 0    Intake/Output this shift:  No intake/output data recorded.  Physical Exam: General: Chronically ill-appearing  Head: Oral mucosa moist  Eyes: Anicteric  Lungs:  Diminished  Heart: Regular rate and rhythm  Abdomen:  Peg tube in place   Extremities: Trace peripheral edema.  Neurologic: Alert and oriented to self  Skin: No lesions  Access: Rt internal jugular permcath    Basic Metabolic Panel: Recent Labs  Lab 11/18/23 0800 11/19/23 0412 11/19/23 1248 11/20/23 0755 11/21/23 0358 11/22/23 0451 11/23/23 0415 11/24/23 0404  NA 134* 135   < > 133* 134* 135 132* 129*  K 6.3* 6.1*   < > 6.3* 5.1 5.3* 4.6 4.8  CL 97* 98   < > 96* 96* 97* 95* 94*  CO2 26 28   < > 25 28 26 26 24   GLUCOSE 138* 133*   < > 119* 130* 117* 120* 121*  BUN 77* 54*   < > 77* 53* 73* 56* 85*  CREATININE 8.39* 5.87*   < > 7.74* 5.48* 6.96* 4.76* 6.51*  CALCIUM  7.0* 7.0*   < > 7.3* 7.5* 7.7* 7.5* 7.4*  PHOS 8.4* 6.6*  --  7.9*  --   --  5.1*  --    < > = values in this interval not displayed.    Liver Function Tests: Recent Labs  Lab 11/18/23 0800 11/19/23 0412 11/20/23 0755 11/23/23 0415  ALBUMIN 1.6* 1.6* 1.6* 1.5*   No results for input(s): LIPASE, AMYLASE in the last 168 hours. No results for input(s): AMMONIA in the last 168 hours.  CBC: Recent  Labs  Lab 11/21/23 0358 11/22/23 0451 11/22/23 1712 11/23/23 0415 11/23/23 1631 11/24/23 0404  WBC 14.7* 15.3* 16.7* 17.4*  --  16.7*  HGB 7.3* 7.2* 7.5* 6.8* 7.8* 7.8*  HCT 23.5* 22.9* 23.8* 21.4* 24.2* 24.2*  MCV 95.5 92.7 94.1 92.6  --  90.0  PLT 325 356 330 334  --  330    Cardiac Enzymes: No results for input(s): CKTOTAL, CKMB, CKMBINDEX, TROPONINI in the last 168 hours.  BNP: Invalid input(s): POCBNP  CBG: Recent Labs  Lab 11/22/23 1623 11/23/23 0843 11/23/23 1251 11/23/23 1548 11/24/23 0753  GLUCAP 110* 125* 116* 124* 128*    Microbiology: Results for orders placed or performed during the hospital encounter of 10/05/23  Blood Culture (routine x 2)     Status: None   Collection Time: 10/05/23  5:06 AM   Specimen: BLOOD  Result Value Ref Range Status   Specimen Description BLOOD LA  Final   Special Requests   Final    BOTTLES DRAWN AEROBIC AND ANAEROBIC Blood Culture results may not be optimal due to an inadequate volume of blood received in culture bottles   Culture   Final    NO GROWTH 5  DAYS Performed at [facility], [address] [place], [place] [address]    Report Status 10/10/2023 FINAL  Final  Blood Culture (routine x 2)     Status: None   Collection Time: 10/05/23  5:07 AM   Specimen: BLOOD  Result Value Ref Range Status   Specimen Description BLOOD RA  Final   Special Requests   Final    BOTTLES DRAWN AEROBIC AND ANAEROBIC Blood Culture results may not be optimal due to an inadequate volume of blood received in culture bottles   Culture   Final    NO GROWTH 5 DAYS Performed at [facility], [address] [place], [place] [address]    Report Status 10/10/2023 FINAL  Final  Resp panel by RT-PCR (RSV, Flu A&B, Covid) Anterior Nasal Swab     Status: None   Collection Time: 10/05/23  5:56 AM   Specimen: Anterior Nasal Swab  Result Value Ref Range Status   SARS Coronavirus 2 by RT PCR NEGATIVE NEGATIVE Final     Comment: (NOTE) SARS-CoV-2 target nucleic acids are NOT DETECTED.  The SARS-CoV-2 RNA is generally detectable in upper respiratory specimens during the acute phase of infection. The lowest concentration of SARS-CoV-2 viral copies this assay can detect is 138 copies/mL. A negative result does not preclude SARS-Cov-2 infection and should not be used as the sole basis for treatment or other patient management decisions. A negative result may occur with  improper specimen collection/handling, submission of specimen other than nasopharyngeal swab, presence of viral mutation(s) within the areas targeted by this assay, and inadequate number of viral copies(<138 copies/mL). A negative result must be combined with clinical observations, patient history, and epidemiological information. The expected result is Negative.  Fact Sheet for Patients:  BloggerCourse.com  Fact Sheet for Healthcare Providers:  SeriousBroker.it  This test is no t yet approved or cleared by the [place]  FDA and  has been authorized for detection and/or diagnosis of SARS-CoV-2 by FDA under an Emergency Use Authorization (EUA). This EUA will remain  in effect (meaning this test can be used) for the duration of the COVID-19 declaration under Section 564(b)(1) of the Act, 21 U.S.C.section 360bbb-3(b)(1), unless the authorization is terminated  or revoked sooner.       Influenza A by PCR NEGATIVE NEGATIVE Final   Influenza B by PCR NEGATIVE NEGATIVE Final    Comment: (NOTE) The Xpert Xpress SARS-CoV-2/FLU/RSV plus assay is intended as an aid in the diagnosis of influenza from Nasopharyngeal swab specimens and should not be used as a sole basis for treatment. Nasal washings and aspirates are unacceptable for Xpert Xpress SARS-CoV-2/FLU/RSV testing.  Fact Sheet for Patients: BloggerCourse.com  Fact Sheet for Healthcare  Providers: SeriousBroker.it  This test is not yet approved or cleared by the [place]  FDA and has been authorized for detection and/or diagnosis of SARS-CoV-2 by FDA under an Emergency Use Authorization (EUA). This EUA will remain in effect (meaning this test can be used) for the duration of the COVID-19 declaration under Section 564(b)(1) of the Act, 21 U.S.C. section 360bbb-3(b)(1), unless the authorization is terminated or revoked.     Resp Syncytial Virus by PCR NEGATIVE NEGATIVE Final    Comment: (NOTE) Fact Sheet for Patients: BloggerCourse.com  Fact Sheet for Healthcare Providers: SeriousBroker.it  This test is not yet approved or cleared by the [place]  FDA and has been authorized for detection and/or diagnosis of SARS-CoV-2 by FDA under an Emergency Use Authorization (EUA). This EUA will  remain in effect (meaning this test can be used) for the duration of the COVID-19 declaration under Section 564(b)(1) of the Act, 21 U.S.C. section 360bbb-3(b)(1), unless the authorization is terminated or revoked.  Performed at [facility], [address] [place], [place] [address]   Respiratory (~20 pathogens) panel by PCR     Status: None   Collection Time: 10/05/23  8:11 AM   Specimen: Nasopharyngeal Swab; Respiratory  Result Value Ref Range Status   Adenovirus NOT DETECTED NOT DETECTED Final   Coronavirus 229E NOT DETECTED NOT DETECTED Final    Comment: (NOTE) The Coronavirus on the Respiratory Panel, DOES NOT test for the novel  Coronavirus (2019 nCoV)    Coronavirus HKU1 NOT DETECTED NOT DETECTED Final   Coronavirus NL63 NOT DETECTED NOT DETECTED Final   Coronavirus OC43 NOT DETECTED NOT DETECTED Final   Metapneumovirus NOT DETECTED NOT DETECTED Final   Rhinovirus / Enterovirus NOT DETECTED NOT DETECTED Final   Influenza A NOT DETECTED NOT DETECTED Final   Influenza B NOT  DETECTED NOT DETECTED Final   Parainfluenza Virus 1 NOT DETECTED NOT DETECTED Final   Parainfluenza Virus 2 NOT DETECTED NOT DETECTED Final   Parainfluenza Virus 3 NOT DETECTED NOT DETECTED Final   Parainfluenza Virus 4 NOT DETECTED NOT DETECTED Final   Respiratory Syncytial Virus NOT DETECTED NOT DETECTED Final   Bordetella pertussis NOT DETECTED NOT DETECTED Final   Bordetella Parapertussis NOT DETECTED NOT DETECTED Final   Chlamydophila pneumoniae NOT DETECTED NOT DETECTED Final   Mycoplasma pneumoniae NOT DETECTED NOT DETECTED Final    Comment: Performed at [facility], [address] [place], [place] [address]  Expectorated Sputum Assessment w Gram Stain, Rflx to Resp Cult     Status: None   Collection Time: 10/05/23  9:07 AM   Specimen: Sputum  Result Value Ref Range Status   Specimen Description SPUTUM  Final   Special Requests NONE  Final   Sputum evaluation   Final    Sputum specimen not acceptable for testing.  Please recollect.   C/KERRY NELSON AT 1005 10/05/23.PMF Performed at [facility], [address] [place], [place] [address]    Report Status 10/05/2023 FINAL  Final  Expectorated Sputum Assessment w Gram Stain, Rflx to Resp Cult     Status: None   Collection Time: 10/05/23 10:50 AM  Result Value Ref Range Status   Specimen Description EXPECTORATED SPUTUM  Final   Special Requests NONE  Final   Sputum evaluation   Final    THIS SPECIMEN IS ACCEPTABLE FOR SPUTUM CULTURE Performed at [facility], [address] [place], [place] [address]    Report Status 10/05/2023 FINAL  Final  Culture, Respiratory w Gram Stain     Status: None   Collection Time: 10/05/23 10:50 AM  Result Value Ref Range Status   Specimen Description   Final    EXPECTORATED SPUTUM Performed at [facility], [address] [place], [place] [address]    Special Requests   Final    NONE Reflexed from [id number] Performed at [facility], [address] [place], [place] [address]    Gram Stain   Final    RARE WBC SEEN RARE VONNE POSITIVE RODS RARE VONNE POSITIVE COCCI RARE GRAM NEGATIVE RODS    Culture   Final    FEW Normal respiratory flora-no Staph aureus or Pseudomonas seen Performed at [facility], [address] [place], [place] [address]  Report Status 10/07/2023 FINAL  Final  MRSA Next Gen by PCR, Nasal     Status: None   Collection Time: 10/06/23 12:57 AM   Specimen: Nasal Mucosa; Nasal Swab  Result Value Ref Range Status   MRSA by PCR Next Gen NOT DETECTED NOT DETECTED Final    Comment: (NOTE) The GeneXpert MRSA Assay (FDA approved for NASAL specimens only), is one component of a comprehensive MRSA colonization surveillance program. It is not intended to diagnose MRSA infection nor to guide or monitor treatment for MRSA infections. Test performance is not FDA approved in patients less than 87 years old. Performed at [facility], [address] [place], [place] [address]   Culture, Respiratory w Gram Stain     Status: None   Collection Time: 10/06/23 11:37 AM   Specimen: INDUCED SPUTUM  Result Value Ref Range Status   Specimen Description   Final    INDUCED SPUTUM Performed at [facility], [address] [place], [place] [address]    Special Requests   Final    NONE Performed at [facility], [address] [place], [place] [address]    Gram Stain   Final    FEW WBC PRESENT,BOTH PMN AND MONONUCLEAR FEW GRAM POSITIVE RODS    Culture   Final    MODERATE LACTOBACILLUS FERMENTUM Standardized susceptibility testing for this organism is not available. Performed at [facility], [address], [place], [place] [address]    Report Status 10/09/2023 FINAL  Final  Culture, blood (x 2)     Status: None   Collection Time: 10/22/23  1:00 AM   Specimen: BLOOD  Result Value Ref Range Status   Specimen Description BLOOD BLOOD RIGHT ARM  Final   Special Requests    Final    BOTTLES DRAWN AEROBIC AND ANAEROBIC Blood Culture adequate volume   Culture   Final    NO GROWTH 5 DAYS Performed at [facility], [address], [place], [place] [address]    Report Status 10/27/2023 FINAL  Final  Culture, blood (x 2)     Status: None   Collection Time: 10/22/23  1:00 AM   Specimen: BLOOD  Result Value Ref Range Status   Specimen Description BLOOD BLOOD RIGHT ARM  Final   Special Requests   Final    BOTTLES DRAWN AEROBIC AND ANAEROBIC Blood Culture adequate volume   Culture   Final    NO GROWTH 5 DAYS Performed at [facility], [address] [place], [place] [address]    Report Status 10/27/2023 FINAL  Final  Resp panel by RT-PCR (RSV, Flu A&B, Covid) Anterior Nasal Swab     Status: None   Collection Time: 11/09/23  5:31 PM   Specimen: Anterior Nasal Swab  Result Value Ref Range Status   SARS Coronavirus 2 by RT PCR NEGATIVE NEGATIVE Final    Comment: (NOTE) SARS-CoV-2 target nucleic acids are NOT DETECTED.  The SARS-CoV-2 RNA is generally detectable in upper respiratory specimens during the acute phase of infection. The lowest concentration of SARS-CoV-2 viral copies this assay can detect is 138 copies/mL. A negative result does not preclude SARS-Cov-2 infection and should not be used as the sole basis for treatment or other patient management decisions. A negative result may occur with  improper specimen collection/handling, submission of specimen other than nasopharyngeal swab, presence of viral mutation(s) within the areas targeted by this assay, and inadequate number of viral copies(<138 copies/mL). A negative  result must be combined with clinical observations, patient history, and epidemiological information. The expected result is Negative.  Fact Sheet for Patients:  BloggerCourse.com  Fact Sheet for Healthcare Providers:  SeriousBroker.it  This test is no t yet  approved or cleared by the [place]  FDA and  has been authorized for detection and/or diagnosis of SARS-CoV-2 by FDA under an Emergency Use Authorization (EUA). This EUA will remain  in effect (meaning this test can be used) for the duration of the COVID-19 declaration under Section 564(b)(1) of the Act, 21 U.S.C.section 360bbb-3(b)(1), unless the authorization is terminated  or revoked sooner.       Influenza A by PCR NEGATIVE NEGATIVE Final   Influenza B by PCR NEGATIVE NEGATIVE Final    Comment: (NOTE) The Xpert Xpress SARS-CoV-2/FLU/RSV plus assay is intended as an aid in the diagnosis of influenza from Nasopharyngeal swab specimens and should not be used as a sole basis for treatment. Nasal washings and aspirates are unacceptable for Xpert Xpress SARS-CoV-2/FLU/RSV testing.  Fact Sheet for Patients: BloggerCourse.com  Fact Sheet for Healthcare Providers: SeriousBroker.it  This test is not yet approved or cleared by the [place]  FDA and has been authorized for detection and/or diagnosis of SARS-CoV-2 by FDA under an Emergency Use Authorization (EUA). This EUA will remain in effect (meaning this test can be used) for the duration of the COVID-19 declaration under Section 564(b)(1) of the Act, 21 U.S.C. section 360bbb-3(b)(1), unless the authorization is terminated or revoked.     Resp Syncytial Virus by PCR NEGATIVE NEGATIVE Final    Comment: (NOTE) Fact Sheet for Patients: BloggerCourse.com  Fact Sheet for Healthcare Providers: SeriousBroker.it  This test is not yet approved or cleared by the [place]  FDA and has been authorized for detection and/or diagnosis of SARS-CoV-2 by FDA under an Emergency Use Authorization (EUA). This EUA will remain in effect (meaning this test can be used) for the duration of the COVID-19 declaration under Section 564(b)(1) of  the Act, 21 U.S.C. section 360bbb-3(b)(1), unless the authorization is terminated or revoked.  Performed at [facility], [address] [place], [place] [address]   Respiratory (~20 pathogens) panel by PCR     Status: None   Collection Time: 11/09/23  5:31 PM   Specimen: Nasopharyngeal Swab; Respiratory  Result Value Ref Range Status   Adenovirus NOT DETECTED NOT DETECTED Final   Coronavirus 229E NOT DETECTED NOT DETECTED Final    Comment: (NOTE) The Coronavirus on the Respiratory Panel, DOES NOT test for the novel  Coronavirus (2019 nCoV)    Coronavirus HKU1 NOT DETECTED NOT DETECTED Final   Coronavirus NL63 NOT DETECTED NOT DETECTED Final   Coronavirus OC43 NOT DETECTED NOT DETECTED Final   Metapneumovirus NOT DETECTED NOT DETECTED Final   Rhinovirus / Enterovirus NOT DETECTED NOT DETECTED Final   Influenza A NOT DETECTED NOT DETECTED Final   Influenza B NOT DETECTED NOT DETECTED Final   Parainfluenza Virus 1 NOT DETECTED NOT DETECTED Final   Parainfluenza Virus 2 NOT DETECTED NOT DETECTED Final   Parainfluenza Virus 3 NOT DETECTED NOT DETECTED Final   Parainfluenza Virus 4 NOT DETECTED NOT DETECTED Final   Respiratory Syncytial Virus NOT DETECTED NOT DETECTED Final   Bordetella pertussis NOT DETECTED NOT DETECTED Final   Bordetella Parapertussis NOT DETECTED NOT DETECTED Final   Chlamydophila pneumoniae NOT DETECTED NOT DETECTED Final   Mycoplasma pneumoniae NOT DETECTED NOT DETECTED Final    Comment: Performed at [facility], [address], [place], [place] [address]  Culture, blood (Routine X 2) w Reflex to ID Panel     Status: None   Collection Time: 11/09/23  6:16 PM   Specimen: BLOOD  Result Value Ref Range Status   Specimen Description   Final    BLOOD RIGHT ANTECUBITAL Performed at [facility], [address], [place], [place] [address]    Special Requests   Final    BOTTLES DRAWN AEROBIC ONLY Blood Culture adequate volume Performed  at [facility], [address] [place], [place] [address]    Culture   Final    NO GROWTH 11 DAYS Performed at [facility], [address] [place], [place] [address]    Report Status 11/20/2023 FINAL  Final  Culture, blood (Routine X 2) w Reflex to ID Panel     Status: None   Collection Time: 11/09/23  6:23 PM   Specimen: BLOOD  Result Value Ref Range Status   Specimen Description   Final    BLOOD BLOOD LEFT FOREARM Performed at [facility], [address] [place], [place] [address]    Special Requests   Final    BOTTLES DRAWN AEROBIC AND ANAEROBIC Blood Culture adequate volume Performed at [facility], [address], [place], [place] [address]    Culture   Final    NO GROWTH 11 DAYS Performed at [facility], [address], [place], [place] [address]    Report Status 11/20/2023 FINAL  Final  Urine Culture     Status: Abnormal   Collection Time: 11/10/23  1:58 AM   Specimen: Urine, Random  Result Value Ref Range Status   Specimen Description   Final    URINE, RANDOM Performed at [facility], [address] [place], [place] [address]    Special Requests   Final    NONE Reflexed from [id number] Performed at [facility], [address] [place], [place] [address]    Culture 60,000 COLONIES/mL ENTEROCOCCUS FAECALIS (A)  Final   Report Status 11/12/2023 FINAL  Final   Organism ID, Bacteria ENTEROCOCCUS FAECALIS (A)  Final      Susceptibility   Enterococcus faecalis - MIC*    AMPICILLIN  <=2 SENSITIVE Sensitive     NITROFURANTOIN <=16 SENSITIVE Sensitive     VANCOMYCIN 1 SENSITIVE Sensitive     * 60,000 COLONIES/mL ENTEROCOCCUS FAECALIS    Coagulation Studies: No results for input(s): LABPROT, INR in the last 72 hours.  Urinalysis: No results for input(s): COLORURINE, LABSPEC, PHURINE, GLUCOSEU, HGBUR, BILIRUBINUR, KETONESUR, PROTEINUR, UROBILINOGEN, NITRITE, LEUKOCYTESUR in the  last 72 hours.  Invalid input(s): APPERANCEUR     Imaging: No results found.      Medications:    anticoagulant sodium citrate     feeding supplement (NEPRO CARB STEADY) 1,000 mL (11/23/23 0406)    sodium chloride    Intravenous Once   Chlorhexidine  Gluconate Cloth  6 each Topical Q0600   epoetin  alfa-epbx (RETACRIT ) injection  10,000 Units Intravenous Q M,W,F-HD   feeding supplement (PROSource TF20)  60 mL Per Tube Daily   free water   60 mL Per Tube Q4H   gentamicin  ointment   Topical TID   heparin  injection (subcutaneous)  5,000 Units Subcutaneous Q8H   insulin  aspart  10 Units Subcutaneous Once   multivitamin  1 tablet Per Tube QHS   pantoprazole  (PROTONIX ) IV  40 mg Intravenous Q12H   acetaminophen  **OR** acetaminophen , alteplase , anticoagulant sodium citrate, artificial tears,  bisacodyl , ipratropium-albuterol , [DISCONTINUED] ondansetron  **OR** ondansetron  (ZOFRAN ) IV, mouth rinse  Assessment/ Plan:  Mr. Robert Vega is a 82 y.o.  male with obstructive sleep apnea, GERD, obesity, hypertension, dysphagia, severe esophageal dysmotility with achalasia status post PEG tube placement, recent aspiration pneumonia acute respiratory failure status post extubation 10/09/2023, who was admitted to [facility] on 10/05/2023 for Aspiration into respiratory tract, initial encounter [T17.908A] Severe sepsis (HCC) [A41.9, R65.20] History of dysphagia [Z87.898] Fever, unspecified fever cause [R50.9] Multifocal pneumonia [J18.9]   1.  Acute kidney injury.  Baseline creatinine 0.81 from 10/14/2023.  Acute kidney injury secondary to ATN, obstructive uropathy, and progression of prerenal azotemia.   Patient is critically ill.  He was started on urgent hemodialysis for severe hyperkalemia and uremia. - Vascular surgery placed PermCath on 6/26. . -Next dialysis treatment scheduled for Monday - TOC dialysis coordinator notified of need for outpatient dialysis clinic.  Search in progress. -No urine  output    2.  Severe hyperkalemia-potassium 4.8.   3.  Sepsis due to aspiration pneumonia, found to have distal esophageal obstruction with severe dysmotility.  He is status post botulinum toxin injection to the lower esophageal sphincter.  PEG tube placed this admission. Receiving continuous tube feeds.   4.Anemia in the setting of renal failure Lab Results  Component Value Date   HGB 7.8 (L) 11/24/2023  Hemoglobin 7.8, received blood transfusion yeserday.  Continue Epogen  10,000 units IV with dialysis.     5.  Bilateral hydronephrosis Noted on CT.Urology team has evaluated the patient and it is thought to be secondary to reflux.  Due to health, further intervention deferred at this time.     LOS: 50 [address] 6/29/202511:00 AM

## 2023-11-24 NOTE — Progress Notes (Signed)
 PROGRESS NOTE    Robert Vega  FMW:[id number] DOB: 02-07-1941 DOA: 10/05/2023 PCP: Jimmy Charlie FERNS, MD    Assessment & Plan:   Principal Problem:   Severe sepsis [facility]) Active Problems:   Gastrostomy complication (HCC)   Dysphagia   Achalasia of esophagus   AKI (acute kidney injury) (HCC)   Acute delirium   Acute respiratory failure with hypoxia and hypercapnia (HCC)   Essential hypertension, benign   GERD (gastroesophageal reflux disease)   Encounter for dialysis and dialysis catheter care [facility])   Multifocal pneumonia   History of dysphagia   Aspiration pneumonia (HCC)   Obesity (BMI 30-39.9)   Generalized weakness   Generalized abdominal pain   Malnutrition of moderate degree   Melena  Assessment and Plan: AKI: secondary to ATN as per nephro. On HD and next HD will be tomorrow. Nephro following and recs apprec   Hyperkalemia: WNL today    Possible ileus: resolved.   ACD: likely secondary to CKD. S/p 2 units of pRBCs transfused so far. H&H are trending up today     Achalasia & dysphagia: w/ high aspiration risk. S/p botulinum toxin injection on 5/20, several EGD and SLP evals. Not safe for PO intake unless family wants to accept risk that he will aspirate again. Continue w/ tube ffeeds   Severe sepsis: resolved. See Dr. Lamount note on how pt met severe sepsis criteria. Initially w/ aspiration pna. Later in hospital stay w/ intra-abdominal infection secondary to PEG dislodged.   Acute hypoxic & hypercapnic respiratory failure: s/p intubation, ventilation & extubation. Continue on supplemental oxygen and wean as tolerated.    Chronic pEF CHF: appears compensated. Fluid/volume management w/ HD    Delirium: vs mild cognitive impairment vs anoxic brain injury during critical illness in the ICU, requiring intubation & pressors. Continue w/ supportive care. Granddaughter asked to d/c prn antipsychotics. Continue w/ tele sitter    GERD: continue on PPI    HTN: BP  is WNL currently.     Generalized weakness: PT/OT recs SNF   Obesity: BMI 32.8. Would benefit from weight loss     DVT prophylaxis: heparin   Code Status:  full  Family Communication:  Disposition Plan: likely d/c to SNF  Level of care: Med-Surg  Status is: Inpatient Remains inpatient appropriate because: needs SNF placement & outpatient HD spot    Consultants:  Nephro GI  ICU Palliative care  Gen surg  Procedures:  Antimicrobials:    Subjective: Pt is confused   Objective: Vitals:   11/23/23 1543 11/23/23 1954 11/24/23 0406 11/24/23 0751  BP: (!) 121/52 129/60 124/62 (!) 145/73  Pulse: 78 80 76 90  Resp: 20 (!) 24 (!) 24 (!) 25  Temp: 97.8 F (36.6 C) 97.9 F (36.6 C) 98 F (36.7 C) 98.9 F (37.2 C)  TempSrc:  Oral Oral Oral  SpO2: 94% 92% 91% 95%  Weight:      Height:        Intake/Output Summary (Last 24 hours) at 11/24/2023 0845 Last data filed at 11/24/2023 0510 Gross per 24 hour  Intake 1758 ml  Output 0 ml  Net 1758 ml   Filed Weights   11/19/23 0345 11/20/23 0821 11/20/23 1410  Weight: 102.6 kg 103.6 kg 100.9 kg    Examination:  General exam: Appears comfortable but confused  Respiratory system: diminished breath sounds b/l  Cardiovascular system: S1/S2+. No rubs or clicks   Gastrointestinal system: abd is soft, NT, obese & hypoactive bowel sounds. PEG tube  in place Central nervous system:  lethargic. Psychiatry: Judgement and insight appears poor. Flat mood and affect     Data Reviewed: I have personally reviewed following labs and imaging studies  CBC: Recent Labs  Lab 11/21/23 0358 11/22/23 0451 11/22/23 1712 11/23/23 0415 11/23/23 1631 11/24/23 0404  WBC 14.7* 15.3* 16.7* 17.4*  --  16.7*  HGB 7.3* 7.2* 7.5* 6.8* 7.8* 7.8*  HCT 23.5* 22.9* 23.8* 21.4* 24.2* 24.2*  MCV 95.5 92.7 94.1 92.6  --  90.0  PLT 325 356 330 334  --  330   Basic Metabolic Panel: Recent Labs  Lab 11/18/23 0800 11/19/23 0412 11/19/23 1248  11/20/23 0755 11/21/23 0358 11/22/23 0451 11/23/23 0415 11/24/23 0404  NA 134* 135   < > 133* 134* 135 132* 129*  K 6.3* 6.1*   < > 6.3* 5.1 5.3* 4.6 4.8  CL 97* 98   < > 96* 96* 97* 95* 94*  CO2 26 28   < > 25 28 26 26 24   GLUCOSE 138* 133*   < > 119* 130* 117* 120* 121*  BUN 77* 54*   < > 77* 53* 73* 56* 85*  CREATININE 8.39* 5.87*   < > 7.74* 5.48* 6.96* 4.76* 6.51*  CALCIUM  7.0* 7.0*   < > 7.3* 7.5* 7.7* 7.5* 7.4*  PHOS 8.4* 6.6*  --  7.9*  --   --  5.1*  --    < > = values in this interval not displayed.   GFR: Estimated Creatinine Clearance: 9.9 mL/min (A) (by C-G formula based on SCr of 6.51 mg/dL (H)). Liver Function Tests: Recent Labs  Lab 11/18/23 0800 11/19/23 0412 11/20/23 0755 11/23/23 0415  ALBUMIN 1.6* 1.6* 1.6* 1.5*   No results for input(s): LIPASE, AMYLASE in the last 168 hours. No results for input(s): AMMONIA in the last 168 hours. Coagulation Profile: No results for input(s): INR, PROTIME in the last 168 hours. Cardiac Enzymes: No results for input(s): CKTOTAL, CKMB, CKMBINDEX, TROPONINI in the last 168 hours. BNP (last 3 results) No results for input(s): PROBNP in the last 8760 hours. HbA1C: No results for input(s): HGBA1C in the last 72 hours. CBG: Recent Labs  Lab 11/22/23 1623 11/23/23 0843 11/23/23 1251 11/23/23 1548 11/24/23 0753  GLUCAP 110* 125* 116* 124* 128*   Lipid Profile: No results for input(s): CHOL, HDL, LDLCALC, TRIG, CHOLHDL, LDLDIRECT in the last 72 hours. Thyroid  Function Tests: No results for input(s): TSH, T4TOTAL, FREET4, T3FREE, THYROIDAB in the last 72 hours. Anemia Panel: No results for input(s): VITAMINB12, FOLATE, FERRITIN, TIBC, IRON, RETICCTPCT in the last 72 hours. Sepsis Labs: No results for input(s): PROCALCITON, LATICACIDVEN in the last 168 hours.  No results found for this or any previous visit (from the past 240 hours).       Radiology  Studies: No results found.       Scheduled Meds:  sodium chloride    Intravenous Once   Chlorhexidine  Gluconate Cloth  6 each Topical Q0600   epoetin  alfa-epbx (RETACRIT ) injection  10,000 Units Intravenous Q M,W,F-HD   feeding supplement (PROSource TF20)  60 mL Per Tube Daily   free water   60 mL Per Tube Q4H   gentamicin  ointment   Topical TID   heparin  injection (subcutaneous)  5,000 Units Subcutaneous Q8H   insulin  aspart  10 Units Subcutaneous Once   multivitamin  1 tablet Per Tube QHS   pantoprazole  (PROTONIX ) IV  40 mg Intravenous Q12H   Continuous Infusions:  anticoagulant sodium citrate  feeding supplement (NEPRO CARB STEADY) 1,000 mL (11/23/23 0406)     LOS: 50 days      Anthony CHRISTELLA Pouch, MD [facility] Pager [phone]  If 7PM-7AM, please contact night-coverage [email or web address] 11/24/2023, 8:45 AM

## 2023-11-25 ENCOUNTER — Inpatient Hospital Stay

## 2023-11-25 DIAGNOSIS — J810 Acute pulmonary edema: Secondary | ICD-10-CM | POA: Diagnosis not present

## 2023-11-25 DIAGNOSIS — A419 Sepsis, unspecified organism: Secondary | ICD-10-CM | POA: Diagnosis not present

## 2023-11-25 LAB — BLOOD GAS, VENOUS
Acid-Base Excess: 3.3 mmol/L — ABNORMAL HIGH (ref 0.0–2.0)
Bicarbonate: 29.1 mmol/L — ABNORMAL HIGH (ref 20.0–28.0)
O2 Saturation: 96.3 %
Patient temperature: 37
pCO2, Ven: 48 mmHg (ref 44–60)
pH, Ven: 7.39 (ref 7.25–7.43)
pO2, Ven: 72 mmHg — ABNORMAL HIGH (ref 32–45)

## 2023-11-25 LAB — CBC
HCT: 25.4 % — ABNORMAL LOW (ref 39.0–52.0)
HCT: 23.3 % — ABNORMAL LOW (ref 39.0–52.0)
Hemoglobin: 8.3 g/dL — ABNORMAL LOW (ref 13.0–17.0)
Hemoglobin: 7.5 g/dL — ABNORMAL LOW (ref 13.0–17.0)
MCH: 29 pg (ref 26.0–34.0)
MCH: 29.4 pg (ref 26.0–34.0)
MCHC: 32.7 g/dL (ref 30.0–36.0)
MCHC: 32.2 g/dL (ref 30.0–36.0)
MCV: 88.8 fL (ref 80.0–100.0)
MCV: 91.4 fL (ref 80.0–100.0)
Platelets: 363 10*3/uL (ref 150–400)
Platelets: 332 10*3/uL (ref 150–400)
RBC: 2.86 MIL/uL — ABNORMAL LOW (ref 4.22–5.81)
RBC: 2.55 MIL/uL — ABNORMAL LOW (ref 4.22–5.81)
RDW: 14.9 % (ref 11.5–15.5)
RDW: 15 % (ref 11.5–15.5)
WBC: 20.3 10*3/uL — ABNORMAL HIGH (ref 4.0–10.5)
WBC: 20.8 10*3/uL — ABNORMAL HIGH (ref 4.0–10.5)
nRBC: 0 % (ref 0.0–0.2)
nRBC: 0 % (ref 0.0–0.2)

## 2023-11-25 LAB — COMPREHENSIVE METABOLIC PANEL WITH GFR
ALT: 13 U/L (ref 0–44)
AST: 19 U/L (ref 15–41)
Albumin: 1.8 g/dL — ABNORMAL LOW (ref 3.5–5.0)
Alkaline Phosphatase: 77 U/L (ref 38–126)
Anion gap: 12 (ref 5–15)
BUN: 63 mg/dL — ABNORMAL HIGH (ref 8–23)
CO2: 26 mmol/L (ref 22–32)
Calcium: 7.6 mg/dL — ABNORMAL LOW (ref 8.9–10.3)
Chloride: 94 mmol/L — ABNORMAL LOW (ref 98–111)
Creatinine, Ser: 4.95 mg/dL — ABNORMAL HIGH (ref 0.61–1.24)
GFR, Estimated: 11 mL/min — ABNORMAL LOW (ref >60)
Glucose, Bld: 123 mg/dL — ABNORMAL HIGH (ref 70–99)
Potassium: 4.5 mmol/L (ref 3.5–5.1)
Sodium: 132 mmol/L — ABNORMAL LOW (ref 135–145)
Total Bilirubin: 0.2 mg/dL (ref 0.0–1.2)
Total Protein: 6.7 g/dL (ref 6.5–8.1)

## 2023-11-25 LAB — CBC WITH DIFFERENTIAL/PLATELET
Abs Immature Granulocytes: 0.43 10*3/uL — ABNORMAL HIGH (ref 0.00–0.07)
Basophils Absolute: 0 10*3/uL (ref 0.0–0.1)
Basophils Relative: 0 %
Eosinophils Absolute: 0 10*3/uL (ref 0.0–0.5)
Eosinophils Relative: 0 %
HCT: 23.5 % — ABNORMAL LOW (ref 39.0–52.0)
Hemoglobin: 7.6 g/dL — ABNORMAL LOW (ref 13.0–17.0)
Immature Granulocytes: 2 %
Lymphocytes Relative: 4 %
Lymphs Abs: 0.9 10*3/uL (ref 0.7–4.0)
MCH: 29.6 pg (ref 26.0–34.0)
MCHC: 32.3 g/dL (ref 30.0–36.0)
MCV: 91.4 fL (ref 80.0–100.0)
Monocytes Absolute: 2.5 10*3/uL — ABNORMAL HIGH (ref 0.1–1.0)
Monocytes Relative: 12 %
Neutro Abs: 17 10*3/uL — ABNORMAL HIGH (ref 1.7–7.7)
Neutrophils Relative %: 82 %
Platelets: 350 10*3/uL (ref 150–400)
RBC: 2.57 MIL/uL — ABNORMAL LOW (ref 4.22–5.81)
RDW: 15.2 % (ref 11.5–15.5)
WBC: 20.9 10*3/uL — ABNORMAL HIGH (ref 4.0–10.5)
nRBC: 0 % (ref 0.0–0.2)

## 2023-11-25 LAB — LACTIC ACID, PLASMA: Lactic Acid, Venous: 0.7 mmol/L (ref 0.5–1.9)

## 2023-11-25 LAB — APTT: aPTT: 41 s — ABNORMAL HIGH (ref 24–36)

## 2023-11-25 LAB — BASIC METABOLIC PANEL WITH GFR
Anion gap: 17 — ABNORMAL HIGH (ref 5–15)
BUN: 100 mg/dL — ABNORMAL HIGH (ref 8–23)
CO2: 23 mmol/L (ref 22–32)
Calcium: 7.9 mg/dL — ABNORMAL LOW (ref 8.9–10.3)
Chloride: 91 mmol/L — ABNORMAL LOW (ref 98–111)
Creatinine, Ser: 7.71 mg/dL — ABNORMAL HIGH (ref 0.61–1.24)
GFR, Estimated: 6 mL/min — ABNORMAL LOW (ref >60)
Glucose, Bld: 131 mg/dL — ABNORMAL HIGH (ref 70–99)
Potassium: 5.4 mmol/L — ABNORMAL HIGH (ref 3.5–5.1)
Sodium: 131 mmol/L — ABNORMAL LOW (ref 135–145)

## 2023-11-25 LAB — GLUCOSE, CAPILLARY
Glucose-Capillary: 131 mg/dL — ABNORMAL HIGH (ref 70–99)
Glucose-Capillary: 139 mg/dL — ABNORMAL HIGH (ref 70–99)
Glucose-Capillary: 139 mg/dL — ABNORMAL HIGH (ref 70–99)
Glucose-Capillary: 123 mg/dL — ABNORMAL HIGH (ref 70–99)

## 2023-11-25 LAB — BRAIN NATRIURETIC PEPTIDE: B Natriuretic Peptide: 120.4 pg/mL — ABNORMAL HIGH (ref 0.0–100.0)

## 2023-11-25 LAB — PROTIME-INR
INR: 1.2 (ref 0.8–1.2)
Prothrombin Time: 15.5 s — ABNORMAL HIGH (ref 11.4–15.2)

## 2023-11-25 LAB — PROCALCITONIN: Procalcitonin: 0.2 ng/mL

## 2023-11-25 MED ORDER — ALBUMIN HUMAN 25 % IV SOLN
INTRAVENOUS | Status: AC
  Filled 2023-11-25: qty 100

## 2023-11-25 MED ORDER — VANCOMYCIN HCL 1250 MG/250ML IV SOLN
1250.0000 mg | Freq: Once | INTRAVENOUS | Status: AC
  Administered 2023-11-26: 1250 mg via INTRAVENOUS
  Filled 2023-11-25: qty 250

## 2023-11-25 MED ORDER — SODIUM CHLORIDE 0.9 % IV SOLN
1.0000 g | INTRAVENOUS | Status: DC
  Administered 2023-11-26 – 2023-11-27 (×3): 1 g via INTRAVENOUS
  Filled 2023-11-25 (×3): qty 10

## 2023-11-25 MED ORDER — METRONIDAZOLE 500 MG/100ML IV SOLN
500.0000 mg | Freq: Two times a day (BID) | INTRAVENOUS | Status: DC
  Administered 2023-11-26 – 2023-11-28 (×6): 500 mg via INTRAVENOUS
  Filled 2023-11-25 (×6): qty 100

## 2023-11-25 MED ORDER — ALBUMIN HUMAN 25 % IV SOLN
25.0000 g | Freq: Once | INTRAVENOUS | Status: AC
  Administered 2023-11-25: 25 g via INTRAVENOUS

## 2023-11-25 MED ORDER — VANCOMYCIN HCL IN DEXTROSE 1-5 GM/200ML-% IV SOLN
1000.0000 mg | Freq: Once | INTRAVENOUS | Status: AC
  Administered 2023-11-25: 1000 mg via INTRAVENOUS
  Filled 2023-11-25: qty 200

## 2023-11-25 MED ORDER — VANCOMYCIN HCL IN DEXTROSE 1-5 GM/200ML-% IV SOLN
1000.0000 mg | INTRAVENOUS | Status: DC
  Administered 2023-11-27: 1000 mg via INTRAVENOUS
  Filled 2023-11-25 (×2): qty 200

## 2023-11-25 MED ORDER — HEPARIN SODIUM (PORCINE) 1000 UNIT/ML IJ SOLN
INTRAMUSCULAR | Status: AC
  Filled 2023-11-25: qty 10

## 2023-11-25 MED ORDER — HEPARIN SODIUM (PORCINE) 1000 UNIT/ML DIALYSIS: 1000.0000 [IU] | INTRAMUSCULAR | Status: DC | PRN

## 2023-11-25 MED ORDER — LACTATED RINGERS IV SOLN
150.0000 mL/h | INTRAVENOUS | Status: DC
Start: 2023-11-25 — End: 2023-11-25

## 2023-11-25 MED ORDER — ALTEPLASE 2 MG IJ SOLR: 2.0000 mg | Freq: Once | INTRAMUSCULAR | Status: DC | PRN

## 2023-11-25 MED ORDER — VANCOMYCIN HCL IN DEXTROSE 1-5 GM/200ML-% IV SOLN: 1000.0000 mg | Freq: Once | INTRAVENOUS | Status: DC

## 2023-11-25 NOTE — Progress Notes (Signed)
   11/25/23 1300  Vitals  Temp 98.5 F (36.9 C)  Pulse Rate 92  Resp (!) 21  BP (!) 125/48  SpO2 92 %  O2 Device Nasal Cannula  Weight 100.7 kg  Type of Weight Post-Dialysis  Oxygen Therapy  O2 Flow Rate (L/min) 4 L/min  Patient Activity (if Appropriate) In bed  Pulse Oximetry Type Continuous  Oximetry Probe Site Changed No  Post Treatment  Dialyzer Clearance Lightly streaked   Received patient in bed to unit.  Alert Informed consent signed and in chart.   TX duration: Two hours and fifty four minutes.  Transported back to the room  Alert, without acute distress.  Hand-off given to patient's nurse.  HOB >30 degrees.  Access used: Right chest HD catheter.  Access issues: None  Pt tx ended 6 minutes early due to pt hypotension and bradycardia. Pt given 2x100ml ns bolus before dialysis ended and rinsed back per-NP Breeze order. Pt o2 increaesd to 4L from 3L due to decreased oxygen saturations during bradycardia. Post vitals wnl.

## 2023-11-25 NOTE — Progress Notes (Signed)
 OT Cancellation Note  Patient Details Name: Robert Vega MRN: [id number] DOB: 02-23-1941   Cancelled Treatment:    Reason Eval/Treat Not Completed: Patient at procedure or test/ unavailable. Pt noted to be off the floor for HD this AM. On PM attempt RN requesting to hold. Will continue to follow POC at later date/time as pt available.   Elston Slot, M.S. OTR/L  11/25/23, 3:19 PM  ascom [phone]

## 2023-11-25 NOTE — Progress Notes (Signed)
 PT Cancellation Note  Patient Details Name: Robert Vega MRN: [id number] DOB: [birth year]   Cancelled Treatment:    Reason Eval/Treat Not Completed: Patient at procedure or test/unavailable.  Pt currently off unit at dialysis.  Will re-attempt PT session at a later date/time.  Damien Caulk, PT 11/25/23, 10:19 AM

## 2023-11-25 NOTE — Progress Notes (Signed)
 Pharmacy Antibiotic Note  Robert Vega is a 83 y.o. male admitted on 10/05/2023 with sepsis.  Pharmacy has been consulted for Vancomycin, Cefepime dosing.  Plan: Cefepime 1 gm IV Q24H ordered to start on 6/30 @ 2230.  Vancomycin 2250 mg IV X 1 ordered for 6/30 @ 2230. Vancomycin 1 gm IV Q MWF-HD to start on 7/2 @ 1200.   Height: 5' 9.02 (175.3 cm) Weight: 100.7 kg (222 lb 0.1 oz) IBW/kg (Calculated) : 70.74  Temp (24hrs), Avg:99 F (37.2 C), Min:97.7 F (36.5 C), Max:100.9 F (38.3 C)  Recent Labs  Lab 11/22/23 0451 11/22/23 1712 11/23/23 0415 11/24/23 0404 11/25/23 0437 11/25/23 2158  WBC 15.3* 16.7* 17.4* 16.7* 20.3* 20.8*  CREATININE 6.96*  --  4.76* 6.51* 7.71* 4.95*    Estimated Creatinine Clearance: 13 mL/min (A) (by C-G formula based on SCr of 4.95 mg/dL (H)).    Allergies  Allergen Reactions   Sulfonamide Derivatives     Antimicrobials this admission:   >>    >>   Dose adjustments this admission:   Microbiology results:  BCx:   UCx:    Sputum:    MRSA PCR:   Thank you for allowing pharmacy to be a part of this patient's care.  Juandedios Dudash D 11/25/2023 10:32 PM

## 2023-11-25 NOTE — Progress Notes (Signed)
 The patient arrived from HD with labored breathing, expiratory wheezing noted bilaterally, tachypneic, and an O2 saturation of 94% on 4 LPM. Of note, the [facility] was <30 degrees and the tube feed was infusing. The patient was boosted and the [facility] was elevated, he was given a breathing treatment, and the provider was notified. His other VS were stable. See provider orders. Will continue to monitor.

## 2023-11-25 NOTE — Progress Notes (Signed)
 [facility]  ROUNDING NOTE   Subjective:   Patient seen and evaluated during dialysis   HEMODIALYSIS FLOWSHEET:  Blood Flow Rate (mL/min): 0 mL/min Arterial Pressure (mmHg): -173.32 mmHg Venous Pressure (mmHg): 134.13 mmHg TMP (mmHg): 12.12 mmHg Ultrafiltration Rate (mL/min): 992 mL/min Dialysate Flow Rate (mL/min): 300 ml/min  Able to intermittently answer simple question.  Continuous soft moaning   Objective:  Vital signs in last 24 hours:  Temp:  [97.1 F (36.2 C)-98.8 F (37.1 C)] 98.8 F (37.1 C) (06/30 0853) Pulse Rate:  [79-90] 90 (06/30 0853) Resp:  [20-28] 28 (06/30 0853) BP: (119-162)/(63-82) 124/66 (06/30 0853) SpO2:  [94 %-99 %] 94 % (06/30 0853) Weight:  [101.9 kg] 101.9 kg (06/30 0853)  Weight change:  Filed Weights   11/20/23 0821 11/20/23 1410 11/25/23 0853  Weight: 103.6 kg 100.9 kg 101.9 kg    Intake/Output: I/O last 3 completed shifts: In: 1440 [NG/GT:1440] Out: 0    Intake/Output this shift:  No intake/output data recorded.  Physical Exam: General: Chronically ill-appearing  Head: Oral mucosa moist  Eyes: Anicteric  Lungs:  Diminished  Heart: Regular rate and rhythm  Abdomen:  Peg tube in place   Extremities: Trace peripheral edema.  Neurologic: Alert and oriented to self  Skin: No lesions  Access: Rt internal jugular permcath    Basic Metabolic Panel: Recent Labs  Lab 11/19/23 0412 11/19/23 1248 11/20/23 0755 11/21/23 0358 11/22/23 0451 11/23/23 0415 11/24/23 0404 11/25/23 0437  NA 135   < > 133* 134* 135 132* 129* 131*  K 6.1*   < > 6.3* 5.1 5.3* 4.6 4.8 5.4*  CL 98   < > 96* 96* 97* 95* 94* 91*  CO2 28   < > 25 28 26 26 24 23   GLUCOSE 133*   < > 119* 130* 117* 120* 121* 131*  BUN 54*   < > 77* 53* 73* 56* 85* 100*  CREATININE 5.87*   < > 7.74* 5.48* 6.96* 4.76* 6.51* 7.71*  CALCIUM  7.0*   < > 7.3* 7.5* 7.7* 7.5* 7.4* 7.9*  PHOS 6.6*  --  7.9*  --   --  5.1*  --   --    < > = values in this interval not  displayed.    Liver Function Tests: Recent Labs  Lab 11/19/23 0412 11/20/23 0755 11/23/23 0415  ALBUMIN 1.6* 1.6* 1.5*   No results for input(s): LIPASE, AMYLASE in the last 168 hours. No results for input(s): AMMONIA in the last 168 hours.  CBC: Recent Labs  Lab 11/22/23 0451 11/22/23 1712 11/23/23 0415 11/23/23 1631 11/24/23 0404 11/25/23 0437  WBC 15.3* 16.7* 17.4*  --  16.7* 20.3*  HGB 7.2* 7.5* 6.8* 7.8* 7.8* 8.3*  HCT 22.9* 23.8* 21.4* 24.2* 24.2* 25.4*  MCV 92.7 94.1 92.6  --  90.0 88.8  PLT 356 330 334  --  330 363    Cardiac Enzymes: No results for input(s): CKTOTAL, CKMB, CKMBINDEX, TROPONINI in the last 168 hours.  BNP: Invalid input(s): POCBNP  CBG: Recent Labs  Lab 11/24/23 0753 11/24/23 1205 11/24/23 1618 11/25/23 0631 11/25/23 0808  GLUCAP 128* 117* 128* 131* 139*    Microbiology: Results for orders placed or performed during the hospital encounter of 10/05/23  Blood Culture (routine x 2)     Status: None   Collection Time: 10/05/23  5:06 AM   Specimen: BLOOD  Result Value Ref Range Status   Specimen Description BLOOD LA  Final   Special Requests  Final    BOTTLES DRAWN AEROBIC AND ANAEROBIC Blood Culture results may not be optimal due to an inadequate volume of blood received in culture bottles   Culture   Final    NO GROWTH 5 DAYS Performed at [facility], [address] [place], [place] [address]    Report Status 10/10/2023 FINAL  Final  Blood Culture (routine x 2)     Status: None   Collection Time: 10/05/23  5:07 AM   Specimen: BLOOD  Result Value Ref Range Status   Specimen Description BLOOD RA  Final   Special Requests   Final    BOTTLES DRAWN AEROBIC AND ANAEROBIC Blood Culture results may not be optimal due to an inadequate volume of blood received in culture bottles   Culture   Final    NO GROWTH 5 DAYS Performed at [facility], [address] [place], [place] [address]    Report  Status 10/10/2023 FINAL  Final  Resp panel by RT-PCR (RSV, Flu A&B, Covid) Anterior Nasal Swab     Status: None   Collection Time: 10/05/23  5:56 AM   Specimen: Anterior Nasal Swab  Result Value Ref Range Status   SARS Coronavirus 2 by RT PCR NEGATIVE NEGATIVE Final    Comment: (NOTE) SARS-CoV-2 target nucleic acids are NOT DETECTED.  The SARS-CoV-2 RNA is generally detectable in upper respiratory specimens during the acute phase of infection. The lowest concentration of SARS-CoV-2 viral copies this assay can detect is 138 copies/mL. A negative result does not preclude SARS-Cov-2 infection and should not be used as the sole basis for treatment or other patient management decisions. A negative result may occur with  improper specimen collection/handling, submission of specimen other than nasopharyngeal swab, presence of viral mutation(s) within the areas targeted by this assay, and inadequate number of viral copies(<138 copies/mL). A negative result must be combined with clinical observations, patient history, and epidemiological information. The expected result is Negative.  Fact Sheet for Patients:  BloggerCourse.com  Fact Sheet for Healthcare Providers:  SeriousBroker.it  This test is no t yet approved or cleared by the [place]  FDA and  has been authorized for detection and/or diagnosis of SARS-CoV-2 by FDA under an Emergency Use Authorization (EUA). This EUA will remain  in effect (meaning this test can be used) for the duration of the COVID-19 declaration under Section 564(b)(1) of the Act, 21 U.S.C.section 360bbb-3(b)(1), unless the authorization is terminated  or revoked sooner.       Influenza A by PCR NEGATIVE NEGATIVE Final   Influenza B by PCR NEGATIVE NEGATIVE Final    Comment: (NOTE) The Xpert Xpress SARS-CoV-2/FLU/RSV plus assay is intended as an aid in the diagnosis of influenza from Nasopharyngeal swab  specimens and should not be used as a sole basis for treatment. Nasal washings and aspirates are unacceptable for Xpert Xpress SARS-CoV-2/FLU/RSV testing.  Fact Sheet for Patients: BloggerCourse.com  Fact Sheet for Healthcare Providers: SeriousBroker.it  This test is not yet approved or cleared by the [place]  FDA and has been authorized for detection and/or diagnosis of SARS-CoV-2 by FDA under an Emergency Use Authorization (EUA). This EUA will remain in effect (meaning this test can be used) for the duration of the COVID-19 declaration under Section 564(b)(1) of the Act, 21 U.S.C. section 360bbb-3(b)(1), unless the authorization is terminated or revoked.     Resp Syncytial Virus by PCR NEGATIVE NEGATIVE Final    Comment: (NOTE) Fact Sheet for Patients: BloggerCourse.com  Fact Sheet  for Healthcare Providers: SeriousBroker.it  This test is not yet approved or cleared by the [place]  FDA and has been authorized for detection and/or diagnosis of SARS-CoV-2 by FDA under an Emergency Use Authorization (EUA). This EUA will remain in effect (meaning this test can be used) for the duration of the COVID-19 declaration under Section 564(b)(1) of the Act, 21 U.S.C. section 360bbb-3(b)(1), unless the authorization is terminated or revoked.  Performed at [facility], [address] [place], [place] [address]   Respiratory (~20 pathogens) panel by PCR     Status: None   Collection Time: 10/05/23  8:11 AM   Specimen: Nasopharyngeal Swab; Respiratory  Result Value Ref Range Status   Adenovirus NOT DETECTED NOT DETECTED Final   Coronavirus 229E NOT DETECTED NOT DETECTED Final    Comment: (NOTE) The Coronavirus on the Respiratory Panel, DOES NOT test for the novel  Coronavirus (2019 nCoV)    Coronavirus HKU1 NOT DETECTED NOT DETECTED Final   Coronavirus NL63 NOT  DETECTED NOT DETECTED Final   Coronavirus OC43 NOT DETECTED NOT DETECTED Final   Metapneumovirus NOT DETECTED NOT DETECTED Final   Rhinovirus / Enterovirus NOT DETECTED NOT DETECTED Final   Influenza A NOT DETECTED NOT DETECTED Final   Influenza B NOT DETECTED NOT DETECTED Final   Parainfluenza Virus 1 NOT DETECTED NOT DETECTED Final   Parainfluenza Virus 2 NOT DETECTED NOT DETECTED Final   Parainfluenza Virus 3 NOT DETECTED NOT DETECTED Final   Parainfluenza Virus 4 NOT DETECTED NOT DETECTED Final   Respiratory Syncytial Virus NOT DETECTED NOT DETECTED Final   Bordetella pertussis NOT DETECTED NOT DETECTED Final   Bordetella Parapertussis NOT DETECTED NOT DETECTED Final   Chlamydophila pneumoniae NOT DETECTED NOT DETECTED Final   Mycoplasma pneumoniae NOT DETECTED NOT DETECTED Final    Comment: Performed at [facility], [address], [place], [place] [address]  Expectorated Sputum Assessment w Gram Stain, Rflx to Resp Cult     Status: None   Collection Time: 10/05/23  9:07 AM   Specimen: Sputum  Result Value Ref Range Status   Specimen Description SPUTUM  Final   Special Requests NONE  Final   Sputum evaluation   Final    Sputum specimen not acceptable for testing.  Please recollect.   C/KERRY NELSON AT 1005 10/05/23.PMF Performed at [facility], [address] [place], [place] [address]    Report Status 10/05/2023 FINAL  Final  Expectorated Sputum Assessment w Gram Stain, Rflx to Resp Cult     Status: None   Collection Time: 10/05/23 10:50 AM  Result Value Ref Range Status   Specimen Description EXPECTORATED SPUTUM  Final   Special Requests NONE  Final   Sputum evaluation   Final    THIS SPECIMEN IS ACCEPTABLE FOR SPUTUM CULTURE Performed at [facility], [address], [place], [place] [address]    Report Status 10/05/2023 FINAL  Final  Culture, Respiratory w Gram Stain     Status: None   Collection Time: 10/05/23 10:50 AM  Result Value Ref  Range Status   Specimen Description   Final    EXPECTORATED SPUTUM Performed at [facility], [address] [place], [place] [address]    Special Requests   Final    NONE Reflexed from [id number] Performed at [facility], [address] [place], [place] [address]    Gram Stain   Final    RARE WBC SEEN RARE GRAM POSITIVE RODS RARE  GRAM POSITIVE COCCI RARE GRAM NEGATIVE RODS    Culture   Final    FEW Normal respiratory flora-no Staph aureus or Pseudomonas seen Performed at [facility], [address], [place], [place] [address]    Report Status 10/07/2023 FINAL  Final  MRSA Next Gen by PCR, Nasal     Status: None   Collection Time: 10/06/23 12:57 AM   Specimen: Nasal Mucosa; Nasal Swab  Result Value Ref Range Status   MRSA by PCR Next Gen NOT DETECTED NOT DETECTED Final    Comment: (NOTE) The GeneXpert MRSA Assay (FDA approved for NASAL specimens only), is one component of a comprehensive MRSA colonization surveillance program. It is not intended to diagnose MRSA infection nor to guide or monitor treatment for MRSA infections. Test performance is not FDA approved in patients less than 58 years old. Performed at [facility], [address] [place], [place] [address]   Culture, Respiratory w Gram Stain     Status: None   Collection Time: 10/06/23 11:37 AM   Specimen: INDUCED SPUTUM  Result Value Ref Range Status   Specimen Description   Final    INDUCED SPUTUM Performed at [facility], [address], [place], [place] [address]    Special Requests   Final    NONE Performed at [facility], [address] [place], [place] [address]    Gram Stain   Final    FEW WBC PRESENT,BOTH PMN AND MONONUCLEAR FEW GRAM POSITIVE RODS    Culture   Final    MODERATE LACTOBACILLUS FERMENTUM Standardized susceptibility testing for this organism is not available. Performed at [facility], [address] [place],  [place] [address]    Report Status 10/09/2023 FINAL  Final  Culture, blood (x 2)     Status: None   Collection Time: 10/22/23  1:00 AM   Specimen: BLOOD  Result Value Ref Range Status   Specimen Description BLOOD BLOOD RIGHT ARM  Final   Special Requests   Final    BOTTLES DRAWN AEROBIC AND ANAEROBIC Blood Culture adequate volume   Culture   Final    NO GROWTH 5 DAYS Performed at [facility], [address] [place], [place] [address]    Report Status 10/27/2023 FINAL  Final  Culture, blood (x 2)     Status: None   Collection Time: 10/22/23  1:00 AM   Specimen: BLOOD  Result Value Ref Range Status   Specimen Description BLOOD BLOOD RIGHT ARM  Final   Special Requests   Final    BOTTLES DRAWN AEROBIC AND ANAEROBIC Blood Culture adequate volume   Culture   Final    NO GROWTH 5 DAYS Performed at [facility], [address] [place], [place] [address]    Report Status 10/27/2023 FINAL  Final  Resp panel by RT-PCR (RSV, Flu A&B, Covid) Anterior Nasal Swab     Status: None   Collection Time: 11/09/23  5:31 PM   Specimen: Anterior Nasal Swab  Result Value Ref Range Status   SARS Coronavirus 2 by RT PCR NEGATIVE NEGATIVE Final    Comment: (NOTE) SARS-CoV-2 target nucleic acids are NOT DETECTED.  The SARS-CoV-2 RNA is generally detectable in upper respiratory specimens during the acute phase of infection. The lowest concentration of SARS-CoV-2 viral copies this assay can detect is 138 copies/mL. A negative result does not preclude SARS-Cov-2 infection and should not be used as the sole basis for treatment  or other patient management decisions. A negative result may occur with  improper specimen collection/handling, submission of specimen other than nasopharyngeal swab, presence of viral mutation(s) within the areas targeted by this assay, and inadequate number of viral copies(<138 copies/mL). A negative result must be combined with clinical observations, patient  history, and epidemiological information. The expected result is Negative.  Fact Sheet for Patients:  BloggerCourse.com  Fact Sheet for Healthcare Providers:  SeriousBroker.it  This test is no t yet approved or cleared by the [place]  FDA and  has been authorized for detection and/or diagnosis of SARS-CoV-2 by FDA under an Emergency Use Authorization (EUA). This EUA will remain  in effect (meaning this test can be used) for the duration of the COVID-19 declaration under Section 564(b)(1) of the Act, 21 U.S.C.section 360bbb-3(b)(1), unless the authorization is terminated  or revoked sooner.       Influenza A by PCR NEGATIVE NEGATIVE Final   Influenza B by PCR NEGATIVE NEGATIVE Final    Comment: (NOTE) The Xpert Xpress SARS-CoV-2/FLU/RSV plus assay is intended as an aid in the diagnosis of influenza from Nasopharyngeal swab specimens and should not be used as a sole basis for treatment. Nasal washings and aspirates are unacceptable for Xpert Xpress SARS-CoV-2/FLU/RSV testing.  Fact Sheet for Patients: BloggerCourse.com  Fact Sheet for Healthcare Providers: SeriousBroker.it  This test is not yet approved or cleared by the [place]  FDA and has been authorized for detection and/or diagnosis of SARS-CoV-2 by FDA under an Emergency Use Authorization (EUA). This EUA will remain in effect (meaning this test can be used) for the duration of the COVID-19 declaration under Section 564(b)(1) of the Act, 21 U.S.C. section 360bbb-3(b)(1), unless the authorization is terminated or revoked.     Resp Syncytial Virus by PCR NEGATIVE NEGATIVE Final    Comment: (NOTE) Fact Sheet for Patients: BloggerCourse.com  Fact Sheet for Healthcare Providers: SeriousBroker.it  This test is not yet approved or cleared by the [place]  FDA  and has been authorized for detection and/or diagnosis of SARS-CoV-2 by FDA under an Emergency Use Authorization (EUA). This EUA will remain in effect (meaning this test can be used) for the duration of the COVID-19 declaration under Section 564(b)(1) of the Act, 21 U.S.C. section 360bbb-3(b)(1), unless the authorization is terminated or revoked.  Performed at [facility], [address] [place], [place] [address]   Respiratory (~20 pathogens) panel by PCR     Status: None   Collection Time: 11/09/23  5:31 PM   Specimen: Nasopharyngeal Swab; Respiratory  Result Value Ref Range Status   Adenovirus NOT DETECTED NOT DETECTED Final   Coronavirus 229E NOT DETECTED NOT DETECTED Final    Comment: (NOTE) The Coronavirus on the Respiratory Panel, DOES NOT test for the novel  Coronavirus (2019 nCoV)    Coronavirus HKU1 NOT DETECTED NOT DETECTED Final   Coronavirus NL63 NOT DETECTED NOT DETECTED Final   Coronavirus OC43 NOT DETECTED NOT DETECTED Final   Metapneumovirus NOT DETECTED NOT DETECTED Final   Rhinovirus / Enterovirus NOT DETECTED NOT DETECTED Final   Influenza A NOT DETECTED NOT DETECTED Final   Influenza B NOT DETECTED NOT DETECTED Final   Parainfluenza Virus 1 NOT DETECTED NOT DETECTED Final   Parainfluenza Virus 2 NOT DETECTED NOT DETECTED Final   Parainfluenza Virus 3 NOT DETECTED NOT DETECTED Final   Parainfluenza Virus 4 NOT DETECTED NOT DETECTED Final   Respiratory Syncytial Virus NOT DETECTED NOT DETECTED Final   Bordetella pertussis NOT DETECTED  NOT DETECTED Final   Bordetella Parapertussis NOT DETECTED NOT DETECTED Final   Chlamydophila pneumoniae NOT DETECTED NOT DETECTED Final   Mycoplasma pneumoniae NOT DETECTED NOT DETECTED Final    Comment: Performed at [facility], [address], [place], [place] [address]  Culture, blood (Routine X 2) w Reflex to ID Panel     Status: None   Collection Time: 11/09/23  6:16 PM   Specimen: BLOOD  Result Value  Ref Range Status   Specimen Description   Final    BLOOD RIGHT ANTECUBITAL Performed at [facility], [address], [place], [place] [address]    Special Requests   Final    BOTTLES DRAWN AEROBIC ONLY Blood Culture adequate volume Performed at [facility], [address] [place], [place] [address]    Culture   Final    NO GROWTH 11 DAYS Performed at [facility], [address] [place], [place] [address]    Report Status 11/20/2023 FINAL  Final  Culture, blood (Routine X 2) w Reflex to ID Panel     Status: None   Collection Time: 11/09/23  6:23 PM   Specimen: BLOOD  Result Value Ref Range Status   Specimen Description   Final    BLOOD BLOOD LEFT FOREARM Performed at [facility], [address] [place], [place] [address]    Special Requests   Final    BOTTLES DRAWN AEROBIC AND ANAEROBIC Blood Culture adequate volume Performed at [facility], [address], [place], [place] [address]    Culture   Final    NO GROWTH 11 DAYS Performed at [facility], [address], [place], [place] [address]    Report Status 11/20/2023 FINAL  Final  Urine Culture     Status: Abnormal   Collection Time: 11/10/23  1:58 AM   Specimen: Urine, Random  Result Value Ref Range Status   Specimen Description   Final    URINE, RANDOM Performed at [facility], [address] [place], [place] [address]    Special Requests   Final    NONE Reflexed from [id number] Performed at [facility], [address] [place], [place] [address]    Culture 60,000 COLONIES/mL ENTEROCOCCUS FAECALIS (A)  Final   Report Status 11/12/2023 FINAL  Final   Organism ID, Bacteria ENTEROCOCCUS FAECALIS (A)  Final      Susceptibility   Enterococcus faecalis - MIC*    AMPICILLIN  <=2 SENSITIVE Sensitive     NITROFURANTOIN <=16 SENSITIVE Sensitive     VANCOMYCIN 1 SENSITIVE Sensitive     * 60,000 COLONIES/mL ENTEROCOCCUS FAECALIS    Coagulation  Studies: No results for input(s): LABPROT, INR in the last 72 hours.  Urinalysis: No results for input(s): COLORURINE, LABSPEC, PHURINE, GLUCOSEU, HGBUR, BILIRUBINUR, KETONESUR, PROTEINUR, UROBILINOGEN, NITRITE, LEUKOCYTESUR in the last 72 hours.  Invalid input(s): APPERANCEUR     Imaging: No results found.      Medications:    anticoagulant sodium citrate     feeding supplement (NEPRO CARB STEADY) 1,000 mL (11/23/23 0406)    sodium chloride    Intravenous Once   Chlorhexidine  Gluconate Cloth  6 each Topical Q0600   epoetin  alfa-epbx (RETACRIT ) injection  10,000 Units Intravenous Q M,W,F-HD   feeding supplement (PROSource TF20)  60 mL Per Tube Daily   free water   60 mL Per Tube Q4H   gentamicin  ointment   Topical TID   heparin  injection (subcutaneous)  5,000  Units Subcutaneous Q8H   insulin  aspart  10 Units Subcutaneous Once   multivitamin  1 tablet Per Tube QHS   pantoprazole  (PROTONIX ) IV  40 mg Intravenous Q12H   acetaminophen  **OR** acetaminophen , anticoagulant sodium citrate, artificial tears, bisacodyl , heparin , ipratropium-albuterol , [DISCONTINUED] ondansetron  **OR** ondansetron  (ZOFRAN ) IV, mouth rinse  Assessment/ Plan:  Mr. Robert Vega is a 83 y.o.  male with obstructive sleep apnea, GERD, obesity, hypertension, dysphagia, severe esophageal dysmotility with achalasia status post PEG tube placement, recent aspiration pneumonia acute respiratory failure status post extubation 10/09/2023, who was admitted to [facility] on 10/05/2023 for Aspiration into respiratory tract, initial encounter [T17.908A] Severe sepsis (HCC) [A41.9, R65.20] History of dysphagia [Z87.898] Fever, unspecified fever cause [R50.9] Multifocal pneumonia [J18.9]   1.  Acute kidney injury.  Baseline creatinine 0.81 from 10/14/2023.  Acute kidney injury secondary to ATN, obstructive uropathy, and progression of prerenal azotemia.   Patient is critically ill.  He was started on  urgent hemodialysis for severe hyperkalemia and uremia. - Vascular surgery placed PermCath on 6/26. [facility] dialysis coordinator notified of need for outpatient dialysis clinic.  Search in progress. - Continues to have no urine output -Receiving dialysis today, UF 1.5-2L as tolerated.  - Next treatment scheduled for Wednesday   2.  Severe hyperkalemia-potassium 5.4, will correct with dialysis.   3.  Sepsis due to aspiration pneumonia, found to have distal esophageal obstruction with severe dysmotility.  He is status post botulinum toxin injection to the lower esophageal sphincter.  PEG tube placed this admission. Receiving continuous tube feeds with scheduled free water  flushes.   4.Anemia in the setting of renal failure Lab Results  Component Value Date   HGB 8.3 (L) 11/25/2023  Hemoglobin 8.3, patient has received blood transfusions during this admission.  Continue Epogen  10,000 units IV with dialysis.     5.  Bilateral hydronephrosis Noted on CT.Urology team has evaluated the patient and it is thought to be secondary to reflux.  Due to health, further intervention deferred at this time.     LOS: 51 Allona Gondek 6/30/20259:26 AM

## 2023-11-25 NOTE — Progress Notes (Signed)
 PT Cancellation Note  Patient Details Name: Robert Vega MRN: [id number] DOB: July 12, 1940   Cancelled Treatment:    Reason Eval/Treat Not Completed: Other (comment).  RN requesting to hold therapy at this time d/t pt's current medical status.  Will re-attempt PT session tomorrow.  Damien Caulk, PT 11/25/23, 3:27 PM

## 2023-11-25 NOTE — Plan of Care (Signed)
  Problem: Clinical Measurements: Goal: Will remain free from infection Outcome: Progressing   Problem: Clinical Measurements: Goal: Respiratory complications will improve Outcome: Progressing   Problem: Nutrition: Goal: Adequate nutrition will be maintained Outcome: Progressing

## 2023-11-25 NOTE — Progress Notes (Signed)
 PROGRESS NOTE    Robert Vega  FMW:[id number] DOB: 09-Jan-1941 DOA: 10/05/2023 PCP: Jimmy Charlie FERNS, MD    Assessment & Plan:   Principal Problem:   Severe sepsis [facility]) Active Problems:   Gastrostomy complication (HCC)   Dysphagia   Achalasia of esophagus   AKI (acute kidney injury) (HCC)   Acute delirium   Acute respiratory failure with hypoxia and hypercapnia (HCC)   Essential hypertension, benign   GERD (gastroesophageal reflux disease)   Encounter for dialysis and dialysis catheter care [facility])   Multifocal pneumonia   History of dysphagia   Aspiration pneumonia (HCC)   Obesity (BMI 30-39.9)   Generalized weakness   Generalized abdominal pain   Malnutrition of moderate degree   Melena  Assessment and Plan: AKI: secondary to ATN as per nephro. On HD. Nephro following and recs apprec   Hyperkalemia: will be managed with HD    Possible ileus: resolved.   ACD: likely secondary to CKD. S/p 2 units of pRBCs transfused so far. H&H are trending up.     Achalasia & dysphagia: w/ high aspiration risk. S/p botulinum toxin injection on 5/20, several EGD and SLP evals. Not safe for PO intake unless family wants to accept risk that he will aspirate again. Continue w/ tube feeds.   Severe sepsis: resolved. See Dr. Lamount note on how pt met severe sepsis criteria. Initially w/ aspiration pna. Later in hospital stay w/ intra-abdominal infection secondary to PEG dislodged.  Acute hypoxic & hypercapnic respiratory failure: s/p intubation, ventilation & extubation. Repeat CXR ordered. Continue on supplemental oxygen and wean as tolerated.    Chronic pEF CHF: appears compensated. Fluid/volume management w/ HD    Delirium: vs mild cognitive impairment vs anoxic brain injury during critical illness in the ICU, requiring intubation & pressors. Continue w/ supportive care. Granddaughter asked to d/c prn antipsychotics. Continue w/ tele sitter    GERD: continue on PPI    HTN: BP  is WNL currently.     Generalized weakness: PT/OT recs SNF   Obesity: BMI 32.8. Would benefit from weight loss     DVT prophylaxis: heparin   Code Status:  full  Family Communication: discussed pt's care w/ pt's granddaughter, [name], and answered her questions  Disposition Plan: likely d/c to SNF  Level of care: Med-Surg  Status is: Inpatient Remains inpatient appropriate because: needs SNF placement & outpatient HD spot    Consultants:  Nephro GI  ICU Palliative care  Gen surg  Procedures:  Antimicrobials:    Subjective: Pt c/o shortness of breath   Objective: Vitals:   11/24/23 1941 11/25/23 0408 11/25/23 0526 11/25/23 0808  BP: 119/63 (!) 162/82 (!) 141/72 130/74  Pulse: 79 89 87 81  Resp: 20 20 (!) 22 (!) 23  Temp: (!) 97.1 F (36.2 C) 97.7 F (36.5 C)  98.2 F (36.8 C)  TempSrc: Axillary Oral  Oral  SpO2: 96% 99% 94% 97%  Weight:      Height:        Intake/Output Summary (Last 24 hours) at 11/25/2023 0853 Last data filed at 11/24/2023 2000 Gross per 24 hour  Intake --  Output 0 ml  Net 0 ml   Filed Weights   11/19/23 0345 11/20/23 0821 11/20/23 1410  Weight: 102.6 kg 103.6 kg 100.9 kg    Examination:  General exam: Appears uncomfortable  Respiratory system: decreased breath sounds b/l  Cardiovascular system: S1 & S2+. No rubs or clicks    Gastrointestinal system: abd is soft,  NT, obese, hypoactive bowel sounds  Central nervous system:  lethargic Psychiatry: Judgement and insight appears poor. Flat mood and affect    Data Reviewed: I have personally reviewed following labs and imaging studies  CBC: Recent Labs  Lab 11/22/23 0451 11/22/23 1712 11/23/23 0415 11/23/23 1631 11/24/23 0404 11/25/23 0437  WBC 15.3* 16.7* 17.4*  --  16.7* 20.3*  HGB 7.2* 7.5* 6.8* 7.8* 7.8* 8.3*  HCT 22.9* 23.8* 21.4* 24.2* 24.2* 25.4*  MCV 92.7 94.1 92.6  --  90.0 88.8  PLT 356 330 334  --  330 363   Basic Metabolic Panel: Recent Labs  Lab  11/19/23 0412 11/19/23 1248 11/20/23 0755 11/21/23 0358 11/22/23 0451 11/23/23 0415 11/24/23 0404 11/25/23 0437  NA 135   < > 133* 134* 135 132* 129* 131*  K 6.1*   < > 6.3* 5.1 5.3* 4.6 4.8 5.4*  CL 98   < > 96* 96* 97* 95* 94* 91*  CO2 28   < > 25 28 26 26 24 23   GLUCOSE 133*   < > 119* 130* 117* 120* 121* 131*  BUN 54*   < > 77* 53* 73* 56* 85* 100*  CREATININE 5.87*   < > 7.74* 5.48* 6.96* 4.76* 6.51* 7.71*  CALCIUM  7.0*   < > 7.3* 7.5* 7.7* 7.5* 7.4* 7.9*  PHOS 6.6*  --  7.9*  --   --  5.1*  --   --    < > = values in this interval not displayed.   GFR: Estimated Creatinine Clearance: 8.4 mL/min (A) (by C-G formula based on SCr of 7.71 mg/dL (H)). Liver Function Tests: Recent Labs  Lab 11/19/23 0412 11/20/23 0755 11/23/23 0415  ALBUMIN 1.6* 1.6* 1.5*   No results for input(s): LIPASE, AMYLASE in the last 168 hours. No results for input(s): AMMONIA in the last 168 hours. Coagulation Profile: No results for input(s): INR, PROTIME in the last 168 hours. Cardiac Enzymes: No results for input(s): CKTOTAL, CKMB, CKMBINDEX, TROPONINI in the last 168 hours. BNP (last 3 results) No results for input(s): PROBNP in the last 8760 hours. HbA1C: No results for input(s): HGBA1C in the last 72 hours. CBG: Recent Labs  Lab 11/24/23 0753 11/24/23 1205 11/24/23 1618 11/25/23 0631 11/25/23 0808  GLUCAP 128* 117* 128* 131* 139*   Lipid Profile: No results for input(s): CHOL, HDL, LDLCALC, TRIG, CHOLHDL, LDLDIRECT in the last 72 hours. Thyroid  Function Tests: No results for input(s): TSH, T4TOTAL, FREET4, T3FREE, THYROIDAB in the last 72 hours. Anemia Panel: No results for input(s): VITAMINB12, FOLATE, FERRITIN, TIBC, IRON, RETICCTPCT in the last 72 hours. Sepsis Labs: No results for input(s): PROCALCITON, LATICACIDVEN in the last 168 hours.  No results found for this or any previous visit (from the past 240 hours).        Radiology Studies: No results found.       Scheduled Meds:  sodium chloride    Intravenous Once   Chlorhexidine  Gluconate Cloth  6 each Topical Q0600   epoetin  alfa-epbx (RETACRIT ) injection  10,000 Units Intravenous Q M,W,F-HD   feeding supplement (PROSource TF20)  60 mL Per Tube Daily   free water   60 mL Per Tube Q4H   gentamicin  ointment   Topical TID   heparin  injection (subcutaneous)  5,000 Units Subcutaneous Q8H   insulin  aspart  10 Units Subcutaneous Once   multivitamin  1 tablet Per Tube QHS   pantoprazole  (PROTONIX ) IV  40 mg Intravenous Q12H   Continuous Infusions:  anticoagulant sodium  citrate     feeding supplement (NEPRO CARB STEADY) 1,000 mL (11/23/23 0406)     LOS: 51 days      Anthony CHRISTELLA Pouch, MD [facility] Pager [phone]  If 7PM-7AM, please contact night-coverage [email or web address] 11/25/2023, 8:53 AM

## 2023-11-26 ENCOUNTER — Inpatient Hospital Stay

## 2023-11-26 DIAGNOSIS — N179 Acute kidney failure, unspecified: Secondary | ICD-10-CM | POA: Diagnosis not present

## 2023-11-26 LAB — CBC
HCT: 23.4 % — ABNORMAL LOW (ref 39.0–52.0)
Hemoglobin: 7.4 g/dL — ABNORMAL LOW (ref 13.0–17.0)
MCH: 29.1 pg (ref 26.0–34.0)
MCHC: 31.6 g/dL (ref 30.0–36.0)
MCV: 92.1 fL (ref 80.0–100.0)
Platelets: 331 10*3/uL (ref 150–400)
RBC: 2.54 MIL/uL — ABNORMAL LOW (ref 4.22–5.81)
RDW: 15.1 % (ref 11.5–15.5)
WBC: 21 10*3/uL — ABNORMAL HIGH (ref 4.0–10.5)
nRBC: 0 % (ref 0.0–0.2)

## 2023-11-26 LAB — BASIC METABOLIC PANEL WITH GFR
Anion gap: 14 (ref 5–15)
BUN: 72 mg/dL — ABNORMAL HIGH (ref 8–23)
CO2: 24 mmol/L (ref 22–32)
Calcium: 7.6 mg/dL — ABNORMAL LOW (ref 8.9–10.3)
Chloride: 96 mmol/L — ABNORMAL LOW (ref 98–111)
Creatinine, Ser: 5.64 mg/dL — ABNORMAL HIGH (ref 0.61–1.24)
GFR, Estimated: 9 mL/min — ABNORMAL LOW (ref >60)
Glucose, Bld: 137 mg/dL — ABNORMAL HIGH (ref 70–99)
Potassium: 4.9 mmol/L (ref 3.5–5.1)
Sodium: 134 mmol/L — ABNORMAL LOW (ref 135–145)

## 2023-11-26 LAB — GLUCOSE, CAPILLARY
Glucose-Capillary: 138 mg/dL — ABNORMAL HIGH (ref 70–99)
Glucose-Capillary: 130 mg/dL — ABNORMAL HIGH (ref 70–99)
Glucose-Capillary: 123 mg/dL — ABNORMAL HIGH (ref 70–99)
Glucose-Capillary: 115 mg/dL — ABNORMAL HIGH (ref 70–99)

## 2023-11-26 LAB — LACTIC ACID, PLASMA: Lactic Acid, Venous: 0.9 mmol/L (ref 0.5–1.9)

## 2023-11-26 MED ORDER — ALBUMIN HUMAN 25 % IV SOLN
25.0000 g | INTRAVENOUS | Status: DC | PRN
  Administered 2023-11-27 – 2023-12-09 (×4): 25 g via INTRAVENOUS

## 2023-11-26 MED ORDER — ACETAMINOPHEN 650 MG RE SUPP: 975.0000 mg | Freq: Once | RECTAL | Status: DC

## 2023-11-26 MED ORDER — NEPRO/CARBSTEADY PO LIQD
237.0000 mL | Freq: Every day | ORAL | Status: DC
  Administered 2023-11-27 – 2023-12-03 (×24): 237 mL

## 2023-11-26 MED ORDER — FUROSEMIDE 10 MG/ML IJ SOLN
20.0000 mg | Freq: Once | INTRAMUSCULAR | Status: AC
  Administered 2023-11-26: 20 mg via INTRAVENOUS
  Filled 2023-11-26: qty 4

## 2023-11-26 MED ORDER — MIDODRINE HCL 5 MG PO TABS
5.0000 mg | ORAL_TABLET | Freq: Three times a day (TID) | ORAL | Status: DC
  Administered 2023-11-26 – 2023-12-16 (×49): 5 mg
  Filled 2023-11-26 (×50): qty 1

## 2023-11-26 MED ORDER — ACETAMINOPHEN 325 MG PO TABS
975.0000 mg | ORAL_TABLET | Freq: Once | ORAL | Status: AC
  Administered 2023-11-26: 975 mg via ORAL
  Filled 2023-11-26: qty 3

## 2023-11-26 MED ORDER — FERROUS SULFATE 220 (44 FE) MG/5ML PO SOLN
325.0000 mg | Freq: Every day | ORAL | Status: DC
  Administered 2023-11-26 – 2023-12-16 (×18): 325 mg
  Filled 2023-11-26 (×10): qty 7.39
  Filled 2023-11-26: qty 7.4
  Filled 2023-11-26 (×4): qty 7.39
  Filled 2023-11-26: qty 7.4
  Filled 2023-11-26 (×4): qty 7.39
  Filled 2023-11-26: qty 7.4

## 2023-11-26 MED ORDER — FREE WATER
60.0000 mL | Freq: Every day | Status: DC
  Administered 2023-11-26 – 2023-12-03 (×29): 60 mL

## 2023-11-26 MED ORDER — NEPRO/CARBSTEADY PO LIQD
120.0000 mL | Freq: Every day | ORAL | Status: AC
  Administered 2023-11-26 – 2023-11-27 (×4): 120 mL

## 2023-11-26 NOTE — Progress Notes (Addendum)
 PROGRESS NOTE   HPI was taken from Dr. Eldonna: Robert Vega is a 83 y.o. male with medical history significant of hypertension, sleep apnea, obesity, GERD presenting with acute febrile hypoxia, sepsis  and pneumonia.  Patient noted to be overall poor historian.  Per report, patient with increased work of breathing generalized weakness over the past 12 to 24 hours.  Was initially evaluated urgent care however EMS had to be called.  No severe weakness.  Mild cough per report.  No chest pain or abdominal pain.  No reported nausea or vomiting.  Seen by EMS with noted fever 100.7 and route.  Hypoxic to the mid 80s on room air. Presented to the ER Tmax 22.1, heart rate 90s, respirations mid 20s, BP 80s to 130s.  Requiring 4 L nasal cannula to keep O2 sats greater than 92%.  White count 7.7, hemoglobin 12, platelets 216, lactate 2.2.  COVID flu RSV negative.  Creatinine 1.  Glucose 152.  Chest x-ray with bilateral lower lobe pneumonia.  Positive pulmonary vascular congestion.  As per Dr. Lenon: Robert Vega is a 83 y.o male with significant PMH of OSA, GERD, Obesity, HTN, Dysphagia - presented to the ED 10/05/2023 from with hypoxia, fever and generalized weakness. Dx sepsis/pneumonia, concern for aspiration    Of note, EGD 03/2022 with food in upper esophagus complicated by aspiration event, cardiac arrest with round of CPR, and post resuscitation EGD with concern for lack of peristalsis. Hx prior EGD 08/2020 with note of abnormal cricopharyngeus, decrease in motility in esophagus, and spastic LES    5/10: Admit to [facility] service with sepsis due to Aspiration Pneumonia.  Course complicated by Acute Respiratory Failure due to Aspiration of vomitus w/ cardiac arrest transfer to ICU and intubation.  5/11: flexible bronchoscopy  5/13: PEG tube, +vomiting last night, failed SAT/SBT 5/14: extubated but with severe delirium 5/20:  Botulinum toxin injection into the lower esophageal sphincter by Dr.  Jinny 5/21: esophagram showing diffusely distended esophagus with distal obstruction and severe dysmotility suggestive of achalasia. At that point patient was stabilized to point of pursuing SNF placement but then with complications of PEG tube placement resulted in return to OR 5/27 and tube feeds restarted 5/28 with zosyn  for peritonitis.  Had new melena 6/9 so underwent another EGD without acute bleeding seen.  Renal function declined and nephrology was consulted and without improvement with conservative management, he received dialysis access and started on HD 6/16 as this was in line with family goals of care.  Discharge will now be pending arrangement of facility and outpatient HD if he does not have renal recovery.  He remains in poor prognosis state with intermittently needing to hold tube feeds again for intolerance and needed blood transfusion 6/18 for anemia likely related more to the renal failure as no source of bleeding has been identified thus far. Mental status is oriented to self and location and able to follow simple commands but otherwise confused. PT/OT recommends SNF. Permcath placement scheduled for this am.   As per Dr. Trudy 6/28-11/26/23: Pt is very sick. Overnight, pt spiked a low grade fever and was started on IV broad sprectrum abxs, IV flagyl , vanco, cefepime. CT chest/abd/pelvis ordered. Pt's granddaughter is requesting ID consult. Please consult ID in AM as per granddaughter's request. Pt is full code and full scope of care despite pt having failure to thrive. Please call pt's granddaughter, [name] w/ updates.    Robert Vega  FMW:[id number] DOB: [birth year] DOA: 10/05/2023 PCP:  Jimmy Charlie FERNS, MD    Assessment & Plan:   Principal Problem:   Severe sepsis [facility]) Active Problems:   Gastrostomy complication (HCC)   Dysphagia   Achalasia of esophagus   AKI (acute kidney injury) (HCC)   Acute delirium   Acute respiratory failure with hypoxia and hypercapnia  (HCC)   Essential hypertension, benign   GERD (gastroesophageal reflux disease)   Encounter for dialysis and dialysis catheter care [facility])   Multifocal pneumonia   History of dysphagia   Aspiration pneumonia (HCC)   Obesity (BMI 30-39.9)   Generalized weakness   Generalized abdominal pain   Malnutrition of moderate degree   Melena  Assessment and Plan: Failure to thrive: secondary to all below. Continue w/ full code, full scope of care as per pt's granddaughter.   SIRS: started on night on 11/25/23. W/ leukocytosis, tachypnea, fever and unknown source. Started on IV broad sprectrum abxs, IV flagyl , vanco, cefepime. CT chest/abd/pelvis ordered. Pt's granddaughter is requesting ID consult. Consult ID in AM as per pt's granddaughter's request. Repeat blood cxs NGTD  AKI: secondary to ATN as per nephro. On HD MWF. Had stop HD early b/c of hypotension & bradycardia. Nephro following and recs apprec   Hyperkalemia: will be managed with HD    Possible ileus: resolved.   ACD: likely secondary to CKD. S/p 2 units of pRBCs transfused so far. H&H are labile. Started on iron supplement     Achalasia & dysphagia: w/ high aspiration risk. S/p botulinum toxin injection on 5/20, several EGD and SLP evals. Not safe for PO intake unless family wants to accept risk that he will aspirate again. Continue w/ tube feeds   Severe sepsis: resolved. See Dr. Lamount note on how pt met severe sepsis criteria. Initially w/ aspiration pna. Later in hospital stay w/ intra-abdominal infection secondary to PEG dislodged.  Acute hypoxic & hypercapnic respiratory failure: s/p intubation, ventilation & extubation. Repeat CXR shows interstitial /CHF. Continue on supplemental oxygen and wean as tolerated  Chronic pEF CHF: XR shows interstitial edema/CHF. Fluid/volume management w/ HD    Delirium: vs mild cognitive impairment vs anoxic brain injury during critical illness in the ICU, requiring intubation & pressors.  Continue w/ supportive care. Granddaughter asked to d/c prn antipsychotics. Continue w/ supportive care    GERD: continue on PPI    HTN: BP is on the low end of normal     Generalized weakness: PT/OT recs SNF  Obesity: BMI 32.8. Would benefit from weight loss     DVT prophylaxis: heparin   Code Status:  full  Family Communication: discussed pt's care w/ pt's granddaughter, [name], and answered her questions  Disposition Plan: likely d/c to SNF  Level of care: Med-Surg  Status is: Inpatient Remains inpatient appropriate because: spiked a low grade fever overnight, on IV abxs & CT scans are ordered. Needs SNF placement & outpatient HD spot    Consultants:  Nephro GI  ICU Palliative care  Gen surg  Procedures:  Antimicrobials:    Subjective: Pt c/o pain all over his body.  Objective: Vitals:   11/25/23 1639 11/25/23 1940 11/25/23 2146 11/26/23 0246  BP: (!) 117/95 (!) 121/56 (!) 109/55 119/63  Pulse: 94 94 78 84  Resp: (!) 25 20 20 20   Temp: 98.5 F (36.9 C) (!) 100.5 F (38.1 C) (!) 100.9 F (38.3 C) 98.1 F (36.7 C)  TempSrc: Oral     SpO2: 96% 100% 100% 100%  Weight:      Height:  Intake/Output Summary (Last 24 hours) at 11/26/2023 0913 Last data filed at 11/25/2023 1522 Gross per 24 hour  Intake 2432.75 ml  Output 1200 ml  Net 1232.75 ml   Filed Weights   11/20/23 1410 11/25/23 0853 11/25/23 1300  Weight: 100.9 kg 101.9 kg 100.7 kg    Examination:  General exam: Appears uncomfortable Respiratory system: diminished breath sounds b/l  Cardiovascular system: S1/S2+. No rubs or clicks  Gastrointestinal system: abd is soft, NT, obese & hypoactive bowel sounds  Central nervous system:  lethargic Psychiatry: Judgement and insight appears poor. Flat mood and affect    Data Reviewed: I have personally reviewed following labs and imaging studies  CBC: Recent Labs  Lab 11/23/23 0415 11/23/23 1631 11/24/23 0404 11/25/23 0437  11/25/23 2158 11/26/23 0409  WBC 17.4*  --  16.7* 20.3* 20.9*  20.8* 21.0*  NEUTROABS  --   --   --   --  17.0*  --   HGB 6.8* 7.8* 7.8* 8.3* 7.6*  7.5* 7.4*  HCT 21.4* 24.2* 24.2* 25.4* 23.5*  23.3* 23.4*  MCV 92.6  --  90.0 88.8 91.4  91.4 92.1  PLT 334  --  330 363 350  332 331   Basic Metabolic Panel: Recent Labs  Lab 11/20/23 0755 11/21/23 0358 11/23/23 0415 11/24/23 0404 11/25/23 0437 11/25/23 2158 11/26/23 0409  NA 133*   < > 132* 129* 131* 132* 134*  K 6.3*   < > 4.6 4.8 5.4* 4.5 4.9  CL 96*   < > 95* 94* 91* 94* 96*  CO2 25   < > 26 24 23 26 24   GLUCOSE 119*   < > 120* 121* 131* 123* 137*  BUN 77*   < > 56* 85* 100* 63* 72*  CREATININE 7.74*   < > 4.76* 6.51* 7.71* 4.95* 5.64*  CALCIUM  7.3*   < > 7.5* 7.4* 7.9* 7.6* 7.6*  PHOS 7.9*  --  5.1*  --   --   --   --    < > = values in this interval not displayed.   GFR: Estimated Creatinine Clearance: 11.4 mL/min (A) (by C-G formula based on SCr of 5.64 mg/dL (H)). Liver Function Tests: Recent Labs  Lab 11/20/23 0755 11/23/23 0415 11/25/23 2158  AST  --   --  19  ALT  --   --  13  ALKPHOS  --   --  77  BILITOT  --   --  0.2  PROT  --   --  6.7  ALBUMIN 1.6* 1.5* 1.8*   No results for input(s): LIPASE, AMYLASE in the last 168 hours. No results for input(s): AMMONIA in the last 168 hours. Coagulation Profile: Recent Labs  Lab 11/25/23 2231  INR 1.2   Cardiac Enzymes: No results for input(s): CKTOTAL, CKMB, CKMBINDEX, TROPONINI in the last 168 hours. BNP (last 3 results) No results for input(s): PROBNP in the last 8760 hours. HbA1C: No results for input(s): HGBA1C in the last 72 hours. CBG: Recent Labs  Lab 11/25/23 0631 11/25/23 0808 11/25/23 1627 11/25/23 2151 11/26/23 0609  GLUCAP 131* 139* 139* 123* 138*   Lipid Profile: No results for input(s): CHOL, HDL, LDLCALC, TRIG, CHOLHDL, LDLDIRECT in the last 72 hours. Thyroid  Function Tests: No results for  input(s): TSH, T4TOTAL, FREET4, T3FREE, THYROIDAB in the last 72 hours. Anemia Panel: No results for input(s): VITAMINB12, FOLATE, FERRITIN, TIBC, IRON, RETICCTPCT in the last 72 hours. Sepsis Labs: Recent Labs  Lab 11/25/23 [phone] 11/25/23  2231 11/26/23 0025  PROCALCITON 0.20  --   --   LATICACIDVEN  --  0.7 0.9    Recent Results (from the past 240 hours)  Culture, blood (x 2)     Status: None (Preliminary result)   Collection Time: 11/25/23 10:31 PM   Specimen: BLOOD  Result Value Ref Range Status   Specimen Description BLOOD BLOOD LEFT ARM  Final   Special Requests   Final    BOTTLES DRAWN AEROBIC AND ANAEROBIC Blood Culture adequate volume   Culture   Final    NO GROWTH < 12 HOURS Performed at [facility], [address] [place], [place] [address]    Report Status PENDING  Incomplete  Culture, blood (x 2)     Status: None (Preliminary result)   Collection Time: 11/25/23 10:31 PM   Specimen: BLOOD  Result Value Ref Range Status   Specimen Description BLOOD BLOOD RIGHT ARM  Final   Special Requests   Final    BOTTLES DRAWN AEROBIC AND ANAEROBIC Blood Culture adequate volume   Culture   Final    NO GROWTH < 12 HOURS Performed at [facility], [address] [place], [place] [address]    Report Status PENDING  Incomplete         Radiology Studies: DG Chest 1 View Result Date: 11/25/2023 CLINICAL DATA:  Shortness of breath EXAM: PORTABLE CHEST 1 VIEW COMPARISON:  Film from earlier in the same day. FINDINGS: Dialysis catheter is again identified. Cardiac shadow is prominent but stable. Patient rotation accentuates the mediastinal markings. Central vascular congestion is noted with mild interstitial edema. Stable left basilar atelectasis is noted. IMPRESSION: Mild changes of CHF. Left basilar atelectasis. Electronically Signed   By: Oneil Devonshire M.D.   On: 11/25/2023 22:29   DG Chest Port 1 View Result Date: 11/25/2023 CLINICAL  DATA:  Dyspnea.  Weakness EXAM: PORTABLE CHEST 1 VIEW COMPARISON:  11/11/2023 FINDINGS: Interval placement of a dual lumen dialysis catheter. Tips in the distal SVC. Stable cardiac silhouette. There is new a peripheral interstitial linear markings consistent interstitial edema. LEFT basilar atelectasis and small effusion. No pneumothorax. IMPRESSION: 1. Interval placement of dialysis catheter. 2. Interstitial edema. 3. LEFT basilar atelectasis and small effusion. Electronically Signed   By: Jackquline Boxer M.D.   On: 11/25/2023 18:57         Scheduled Meds:  sodium chloride    Intravenous Once   Chlorhexidine  Gluconate Cloth  6 each Topical Q0600   epoetin  alfa-epbx (RETACRIT ) injection  10,000 Units Intravenous Q M,W,F-HD   feeding supplement (PROSource TF20)  60 mL Per Tube Daily   free water   60 mL Per Tube Q4H   gentamicin  ointment   Topical TID   heparin  injection (subcutaneous)  5,000 Units Subcutaneous Q8H   insulin  aspart  10 Units Subcutaneous Once   multivitamin  1 tablet Per Tube QHS   pantoprazole  (PROTONIX ) IV  40 mg Intravenous Q12H   Continuous Infusions:  ceFEPime (MAXIPIME) IV 1 g (11/26/23 0022)   feeding supplement (NEPRO CARB STEADY) 1,000 mL (11/26/23 0136)   metronidazole  500 mg (11/26/23 0104)   [START ON 11/27/2023] vancomycin       LOS: 52 days      Anthony CHRISTELLA Pouch, MD [facility] Pager [phone]  If 7PM-7AM, please contact night-coverage [email or web address] 11/26/2023, 9:13 AM

## 2023-11-26 NOTE — Progress Notes (Signed)
 Critical care note.  Date of note: 11/25/2023  Subjective: The patient came back from dialysis and was still unresponsive like earlier during the day however more lethargic and seem to be dyspneic.  Temperature was 100. 9 at [facility] up from 100.4 earlier today and BP was 109/55 with heart rate of 87 with normal pulse oximetry on room air and 100% on 2 L of O2 by nasal cannula.  Objective: Physical examination: Generally: Acutely ill elderly Caucasian male in mild respiratory distress.  He was somnolent and responsive only to painful stimuli. Vital signs per history of present illness. Head - atraumatic, normocephalic.  Pupils - equal, round and reactive to light and accommodation. Extraocular movements are intact. No scleral icterus.  Oropharynx - moist mucous membranes and tongue. No pharyngeal erythema or exudate.  Neck - supple. No JVD. Carotid pulses 2+ bilaterally. No carotid bruits. No palpable thyromegaly or lymphadenopathy. Cardiovascular - regular rate and rhythm. Normal S1 and S2. No murmurs, gallops or rubs.  Lungs -slightly diminished bibasilar breath sounds with mild bibasilar rales.  Abdomen - soft and nontender. Positive bowel sounds. No palpable organomegaly or masses.  Extremities - no pitting edema, clubbing or cyanosis.  Neuro - grossly non-focal. Skin - no rashes. GU and rectal exam - deferred.   Labs and notes were reviewed.  Stat portable chest x-ray showed Left basilar atelectasis with mild changes of CHF  Assessment/plan:  1.  Acute pulmonary edema possibly associated with fluid overload in the setting of end-stage renal disease on hemodialysis. - The patient was given IV Lasix  mainly for pulmonary congestion. - Nephrology follow-up consult will be obtained. - O2 protocol will be followed. - VBG were unremarkable with pH of 7.39, PO2 of 72 and PCO248 and HCO3 of 29.1.  2.  SIRS rule out sepsis. - Blood cultures will be drawn. - Will place him on broad-spectrum IV  antibiotic therapy with vancomycin, cefepime and Flagyl .  - Will continue other current plan of care. - Will continue to closely monitor him.  Authorized and performed by: Madison Peaches, MD Total critical care time:    35    minutes. Due to a high probability of clinically significant, life-threatening deterioration, the patient required my highest level of preparedness to intervene emergently and I personally spent this critical care time directly and personally managing the patient.  This critical care time included obtaining a history, examining the patient, pulse oximetry, ordering and review of studies, arranging urgent treatment with development of management plan, evaluation of patient's response to treatment, frequent reassessment, and discussions with other providers. This critical care time was performed to assess and manage the high probability of imminent, life-threatening deterioration that could result in multiorgan failure.  It was exclusive of separately billable procedures and treating other patients and teaching time.

## 2023-11-26 NOTE — Progress Notes (Signed)
 Nutrition Follow Up Note   DOCUMENTATION CODES:   Non-severe (moderate) malnutrition in context of acute illness/injury  INTERVENTION:   Change to bolus feeds of:  Nepro- Give one carton five times daily via tube- Flush with 30ml of water  before and after each feed.   ProSource TF 20- Give 60ml daily via tube, each supplement provides 80kcal and 20g of protein.   Regimen provides 2180kcal/day, 115g/day protein and 1165ml/day of free water .   Rena-vit daily via tube  Daily weights   NUTRITION DIAGNOSIS:   Moderate Malnutrition related to acute illness as evidenced by mild fat depletion, mild muscle depletion, moderate muscle depletion, percent weight loss. -ongoing   GOAL:   Patient will meet greater than or equal to 90% of their needs -met  MONITOR:   Diet advancement, Labs, Weight trends, TF tolerance, I & O's, Skin  ASSESSMENT:   83 y/o male with h/o GERD, esophageal dysphagia, HTN, BPH, mood disorder and hiatal hernia who is admitted with aspiration event, PNA, sepsis, cardiac arrest and dysphagia.  Pt continues to tolerate tube feeds at goal rate. Pt had a significant event after dialysis yesterday; pt noted to be unresponsive and dyspneic. Per RN report, pt was noted to be lying flat on tube feeds yesterday. Will change pt over to bolus feeds to allow him to be off of tube feeds during dialysis and to prepare him for discharge and outpatient HD. Will begin with half a carton for the first few feeds and then advance to a full carton if tolerating. Will check electrolytes tomorrow. Per chart, pt is weight stable.     Medications reviewed and include: epoetin , heparin , midodrine, insulin , rena-vit, protonix , cefepime, metronidazole , vancomycin   Labs reviewed: Na 134(L), K 4.9 wnl, BUN 72(H), creat 5.64(H) BNP 120.4(H)- 6/30 Wbc- 21.0(H), Hgb 7.4(L), Hct 23.4(L) Cbgs- 130, 138 x 24 hrs   UOP- 0ml   Diet Order:   Diet Order             Diet NPO time specified  Except for: Ice Chips  Diet effective now                  EDUCATION NEEDS:   No education needs have been identified at this time  Skin:  Skin Assessment: Reviewed RN Assessment (closed abdominal incision, old skin tears)  Last BM:  6/28- type 7  Height:   Ht Readings from Last 1 Encounters:  10/08/23 5' 9.02 (1.753 m)    Weight:   Wt Readings from Last 1 Encounters:  11/25/23 100.7 kg    Ideal Body Weight:  72.7 kg  BMI:  Body mass index is 32.77 kg/m.  Estimated Nutritional Needs:   Kcal:  2000-2300kcal/day  Protein:  100-115g/day  Fluid:  UOP +1L  Augustin Shams MS, RD, LDN If unable to be reached, please send secure chat to RD inpatient available from 8:00a-4:00p daily

## 2023-11-26 NOTE — Progress Notes (Signed)
 [facility]  ROUNDING NOTE   Subjective:   Patient seen laying in bed Alert and oriented to self and place Denies any shortness of breath Denies pain or discomfort  Objective:  Vital signs in last 24 hours:  Temp:  [97.5 F (36.4 C)-100.9 F (38.3 C)] 97.5 F (36.4 C) (07/01 1037) Pulse Rate:  [78-94] 80 (07/01 1037) Resp:  [20-25] 25 (07/01 1037) BP: (96-121)/(51-95) 96/51 (07/01 1037) SpO2:  [95 %-100 %] 95 % (07/01 1037)  Weight change:  Filed Weights   11/20/23 1410 11/25/23 0853 11/25/23 1300  Weight: 100.9 kg 101.9 kg 100.7 kg    Intake/Output: I/O last 3 completed shifts: In: 2432.8 [NG/GT:2382.8; IV Piggyback:50] Out: 1200 [Other:1200]   Intake/Output this shift:  No intake/output data recorded.  Physical Exam: General: Chronically ill-appearing  Head: Oral mucosa moist  Eyes: Anicteric  Lungs:  Diminished  Heart: Regular rate and rhythm  Abdomen:  Peg tube in place   Extremities: Trace peripheral edema.  Neurologic: Alert and oriented to self  Skin: No lesions  Access: Rt internal jugular permcath    Basic Metabolic Panel: Recent Labs  Lab 11/20/23 0755 11/21/23 0358 11/23/23 0415 11/24/23 0404 11/25/23 0437 11/25/23 2158 11/26/23 0409  NA 133*   < > 132* 129* 131* 132* 134*  K 6.3*   < > 4.6 4.8 5.4* 4.5 4.9  CL 96*   < > 95* 94* 91* 94* 96*  CO2 25   < > 26 24 23 26 24   GLUCOSE 119*   < > 120* 121* 131* 123* 137*  BUN 77*   < > 56* 85* 100* 63* 72*  CREATININE 7.74*   < > 4.76* 6.51* 7.71* 4.95* 5.64*  CALCIUM  7.3*   < > 7.5* 7.4* 7.9* 7.6* 7.6*  PHOS 7.9*  --  5.1*  --   --   --   --    < > = values in this interval not displayed.    Liver Function Tests: Recent Labs  Lab 11/20/23 0755 11/23/23 0415 11/25/23 2158  AST  --   --  19  ALT  --   --  13  ALKPHOS  --   --  77  BILITOT  --   --  0.2  PROT  --   --  6.7  ALBUMIN 1.6* 1.5* 1.8*   No results for input(s): LIPASE, AMYLASE in the last 168 hours. No  results for input(s): AMMONIA in the last 168 hours.  CBC: Recent Labs  Lab 11/23/23 0415 11/23/23 1631 11/24/23 0404 11/25/23 0437 11/25/23 2158 11/26/23 0409  WBC 17.4*  --  16.7* 20.3* 20.9*  20.8* 21.0*  NEUTROABS  --   --   --   --  17.0*  --   HGB 6.8* 7.8* 7.8* 8.3* 7.6*  7.5* 7.4*  HCT 21.4* 24.2* 24.2* 25.4* 23.5*  23.3* 23.4*  MCV 92.6  --  90.0 88.8 91.4  91.4 92.1  PLT 334  --  330 363 350  332 331    Cardiac Enzymes: No results for input(s): CKTOTAL, CKMB, CKMBINDEX, TROPONINI in the last 168 hours.  BNP: Invalid input(s): POCBNP  CBG: Recent Labs  Lab 11/25/23 0808 11/25/23 1627 11/25/23 2151 11/26/23 0609 11/26/23 1121  GLUCAP 139* 139* 123* 138* 130*    Microbiology: Results for orders placed or performed during the hospital encounter of 10/05/23  Blood Culture (routine x 2)     Status: None   Collection Time: 10/05/23  5:06  AM   Specimen: BLOOD  Result Value Ref Range Status   Specimen Description BLOOD LA  Final   Special Requests   Final    BOTTLES DRAWN AEROBIC AND ANAEROBIC Blood Culture results may not be optimal due to an inadequate volume of blood received in culture bottles   Culture   Final    NO GROWTH 5 DAYS Performed at [facility], [address] [place], [place] [address]    Report Status 10/10/2023 FINAL  Final  Blood Culture (routine x 2)     Status: None   Collection Time: 10/05/23  5:07 AM   Specimen: BLOOD  Result Value Ref Range Status   Specimen Description BLOOD RA  Final   Special Requests   Final    BOTTLES DRAWN AEROBIC AND ANAEROBIC Blood Culture results may not be optimal due to an inadequate volume of blood received in culture bottles   Culture   Final    NO GROWTH 5 DAYS Performed at [facility], [address] [place], [place] [address]    Report Status 10/10/2023 FINAL  Final  Resp panel by RT-PCR (RSV, Flu A&B, Covid) Anterior Nasal Swab     Status: None    Collection Time: 10/05/23  5:56 AM   Specimen: Anterior Nasal Swab  Result Value Ref Range Status   SARS Coronavirus 2 by RT PCR NEGATIVE NEGATIVE Final    Comment: (NOTE) SARS-CoV-2 target nucleic acids are NOT DETECTED.  The SARS-CoV-2 RNA is generally detectable in upper respiratory specimens during the acute phase of infection. The lowest concentration of SARS-CoV-2 viral copies this assay can detect is 138 copies/mL. A negative result does not preclude SARS-Cov-2 infection and should not be used as the sole basis for treatment or other patient management decisions. A negative result may occur with  improper specimen collection/handling, submission of specimen other than nasopharyngeal swab, presence of viral mutation(s) within the areas targeted by this assay, and inadequate number of viral copies(<138 copies/mL). A negative result must be combined with clinical observations, patient history, and epidemiological information. The expected result is Negative.  Fact Sheet for Patients:  BloggerCourse.com  Fact Sheet for Healthcare Providers:  SeriousBroker.it  This test is no t yet approved or cleared by the [place]  FDA and  has been authorized for detection and/or diagnosis of SARS-CoV-2 by FDA under an Emergency Use Authorization (EUA). This EUA will remain  in effect (meaning this test can be used) for the duration of the COVID-19 declaration under Section 564(b)(1) of the Act, 21 U.S.C.section 360bbb-3(b)(1), unless the authorization is terminated  or revoked sooner.       Influenza A by PCR NEGATIVE NEGATIVE Final   Influenza B by PCR NEGATIVE NEGATIVE Final    Comment: (NOTE) The Xpert Xpress SARS-CoV-2/FLU/RSV plus assay is intended as an aid in the diagnosis of influenza from Nasopharyngeal swab specimens and should not be used as a sole basis for treatment. Nasal washings and aspirates are unacceptable for  Xpert Xpress SARS-CoV-2/FLU/RSV testing.  Fact Sheet for Patients: BloggerCourse.com  Fact Sheet for Healthcare Providers: SeriousBroker.it  This test is not yet approved or cleared by the [place]  FDA and has been authorized for detection and/or diagnosis of SARS-CoV-2 by FDA under an Emergency Use Authorization (EUA). This EUA will remain in effect (meaning this test can be used) for the duration of the COVID-19 declaration under Section 564(b)(1) of the Act, 21 U.S.C. section 360bbb-3(b)(1), unless the authorization is terminated or revoked.  Resp Syncytial Virus by PCR NEGATIVE NEGATIVE Final    Comment: (NOTE) Fact Sheet for Patients: BloggerCourse.com  Fact Sheet for Healthcare Providers: SeriousBroker.it  This test is not yet approved or cleared by the [place]  FDA and has been authorized for detection and/or diagnosis of SARS-CoV-2 by FDA under an Emergency Use Authorization (EUA). This EUA will remain in effect (meaning this test can be used) for the duration of the COVID-19 declaration under Section 564(b)(1) of the Act, 21 U.S.C. section 360bbb-3(b)(1), unless the authorization is terminated or revoked.  Performed at [facility], [address] [place], [place] [address]   Respiratory (~20 pathogens) panel by PCR     Status: None   Collection Time: 10/05/23  8:11 AM   Specimen: Nasopharyngeal Swab; Respiratory  Result Value Ref Range Status   Adenovirus NOT DETECTED NOT DETECTED Final   Coronavirus 229E NOT DETECTED NOT DETECTED Final    Comment: (NOTE) The Coronavirus on the Respiratory Panel, DOES NOT test for the novel  Coronavirus (2019 nCoV)    Coronavirus HKU1 NOT DETECTED NOT DETECTED Final   Coronavirus NL63 NOT DETECTED NOT DETECTED Final   Coronavirus OC43 NOT DETECTED NOT DETECTED Final   Metapneumovirus NOT DETECTED NOT  DETECTED Final   Rhinovirus / Enterovirus NOT DETECTED NOT DETECTED Final   Influenza A NOT DETECTED NOT DETECTED Final   Influenza B NOT DETECTED NOT DETECTED Final   Parainfluenza Virus 1 NOT DETECTED NOT DETECTED Final   Parainfluenza Virus 2 NOT DETECTED NOT DETECTED Final   Parainfluenza Virus 3 NOT DETECTED NOT DETECTED Final   Parainfluenza Virus 4 NOT DETECTED NOT DETECTED Final   Respiratory Syncytial Virus NOT DETECTED NOT DETECTED Final   Bordetella pertussis NOT DETECTED NOT DETECTED Final   Bordetella Parapertussis NOT DETECTED NOT DETECTED Final   Chlamydophila pneumoniae NOT DETECTED NOT DETECTED Final   Mycoplasma pneumoniae NOT DETECTED NOT DETECTED Final    Comment: Performed at [facility], [address] [place], [place] [address]  Expectorated Sputum Assessment w Gram Stain, Rflx to Resp Cult     Status: None   Collection Time: 10/05/23  9:07 AM   Specimen: Sputum  Result Value Ref Range Status   Specimen Description SPUTUM  Final   Special Requests NONE  Final   Sputum evaluation   Final    Sputum specimen not acceptable for testing.  Please recollect.   C/KERRY NELSON AT 1005 10/05/23.PMF Performed at [facility], [address] [place], [place] [address]    Report Status 10/05/2023 FINAL  Final  Expectorated Sputum Assessment w Gram Stain, Rflx to Resp Cult     Status: None   Collection Time: 10/05/23 10:50 AM  Result Value Ref Range Status   Specimen Description EXPECTORATED SPUTUM  Final   Special Requests NONE  Final   Sputum evaluation   Final    THIS SPECIMEN IS ACCEPTABLE FOR SPUTUM CULTURE Performed at [facility], [address], [place], [place] [address]    Report Status 10/05/2023 FINAL  Final  Culture, Respiratory w Gram Stain     Status: None   Collection Time: 10/05/23 10:50 AM  Result Value Ref Range Status   Specimen Description   Final    EXPECTORATED SPUTUM Performed at [facility], [address] [place], [place] [address]    Special Requests   Final    NONE Reflexed from [id number] Performed at [facility], [address] [place],  [place] [address]    Gram Stain   Final    RARE WBC SEEN RARE GRAM POSITIVE RODS RARE GRAM POSITIVE COCCI RARE GRAM NEGATIVE RODS    Culture   Final    FEW Normal respiratory flora-no Staph aureus or Pseudomonas seen Performed at [facility], [address], [place], [place] [address]    Report Status 10/07/2023 FINAL  Final  MRSA Next Gen by PCR, Nasal     Status: None   Collection Time: 10/06/23 12:57 AM   Specimen: Nasal Mucosa; Nasal Swab  Result Value Ref Range Status   MRSA by PCR Next Gen NOT DETECTED NOT DETECTED Final    Comment: (NOTE) The GeneXpert MRSA Assay (FDA approved for NASAL specimens only), is one component of a comprehensive MRSA colonization surveillance program. It is not intended to diagnose MRSA infection nor to guide or monitor treatment for MRSA infections. Test performance is not FDA approved in patients less than 1 years old. Performed at [facility], [address] [place], [place] [address]   Culture, Respiratory w Gram Stain     Status: None   Collection Time: 10/06/23 11:37 AM   Specimen: INDUCED SPUTUM  Result Value Ref Range Status   Specimen Description   Final    INDUCED SPUTUM Performed at [facility], [address] [place], [place] [address]    Special Requests   Final    NONE Performed at [facility], [address] [place], [place] [address]    Gram Stain   Final    FEW WBC PRESENT,BOTH PMN AND MONONUCLEAR FEW GRAM POSITIVE RODS    Culture   Final    MODERATE LACTOBACILLUS FERMENTUM Standardized susceptibility testing for this organism is not available. Performed at [facility], [address] [place], [place] [address]    Report Status 10/09/2023 FINAL  Final  Culture, blood (x 2)     Status: None   Collection Time:  10/22/23  1:00 AM   Specimen: BLOOD  Result Value Ref Range Status   Specimen Description BLOOD BLOOD RIGHT ARM  Final   Special Requests   Final    BOTTLES DRAWN AEROBIC AND ANAEROBIC Blood Culture adequate volume   Culture   Final    NO GROWTH 5 DAYS Performed at [facility], [address], [place], [place] [address]    Report Status 10/27/2023 FINAL  Final  Culture, blood (x 2)     Status: None   Collection Time: 10/22/23  1:00 AM   Specimen: BLOOD  Result Value Ref Range Status   Specimen Description BLOOD BLOOD RIGHT ARM  Final   Special Requests   Final    BOTTLES DRAWN AEROBIC AND ANAEROBIC Blood Culture adequate volume   Culture   Final    NO GROWTH 5 DAYS Performed at [facility], [address] [place], [place] [address]    Report Status 10/27/2023 FINAL  Final  Resp panel by RT-PCR (RSV, Flu A&B, Covid) Anterior Nasal Swab     Status: None   Collection Time: 11/09/23  5:31 PM   Specimen: Anterior Nasal Swab  Result Value Ref Range Status   SARS Coronavirus 2 by RT PCR NEGATIVE NEGATIVE Final    Comment: (NOTE) SARS-CoV-2 target nucleic acids are NOT DETECTED.  The SARS-CoV-2 RNA is generally detectable in upper respiratory specimens during the acute phase of infection. The lowest concentration of SARS-CoV-2 viral copies this assay can detect is  138 copies/mL. A negative result does not preclude SARS-Cov-2 infection and should not be used as the sole basis for treatment or other patient management decisions. A negative result may occur with  improper specimen collection/handling, submission of specimen other than nasopharyngeal swab, presence of viral mutation(s) within the areas targeted by this assay, and inadequate number of viral copies(<138 copies/mL). A negative result must be combined with clinical observations, patient history, and epidemiological information. The expected result is Negative.  Fact Sheet for Patients:   BloggerCourse.com  Fact Sheet for Healthcare Providers:  SeriousBroker.it  This test is no t yet approved or cleared by the [place]  FDA and  has been authorized for detection and/or diagnosis of SARS-CoV-2 by FDA under an Emergency Use Authorization (EUA). This EUA will remain  in effect (meaning this test can be used) for the duration of the COVID-19 declaration under Section 564(b)(1) of the Act, 21 U.S.C.section 360bbb-3(b)(1), unless the authorization is terminated  or revoked sooner.       Influenza A by PCR NEGATIVE NEGATIVE Final   Influenza B by PCR NEGATIVE NEGATIVE Final    Comment: (NOTE) The Xpert Xpress SARS-CoV-2/FLU/RSV plus assay is intended as an aid in the diagnosis of influenza from Nasopharyngeal swab specimens and should not be used as a sole basis for treatment. Nasal washings and aspirates are unacceptable for Xpert Xpress SARS-CoV-2/FLU/RSV testing.  Fact Sheet for Patients: BloggerCourse.com  Fact Sheet for Healthcare Providers: SeriousBroker.it  This test is not yet approved or cleared by the [place]  FDA and has been authorized for detection and/or diagnosis of SARS-CoV-2 by FDA under an Emergency Use Authorization (EUA). This EUA will remain in effect (meaning this test can be used) for the duration of the COVID-19 declaration under Section 564(b)(1) of the Act, 21 U.S.C. section 360bbb-3(b)(1), unless the authorization is terminated or revoked.     Resp Syncytial Virus by PCR NEGATIVE NEGATIVE Final    Comment: (NOTE) Fact Sheet for Patients: BloggerCourse.com  Fact Sheet for Healthcare Providers: SeriousBroker.it  This test is not yet approved or cleared by the [place]  FDA and has been authorized for detection and/or diagnosis of SARS-CoV-2 by FDA under an Emergency Use  Authorization (EUA). This EUA will remain in effect (meaning this test can be used) for the duration of the COVID-19 declaration under Section 564(b)(1) of the Act, 21 U.S.C. section 360bbb-3(b)(1), unless the authorization is terminated or revoked.  Performed at [facility], [address] [place], [place] [address]   Respiratory (~20 pathogens) panel by PCR     Status: None   Collection Time: 11/09/23  5:31 PM   Specimen: Nasopharyngeal Swab; Respiratory  Result Value Ref Range Status   Adenovirus NOT DETECTED NOT DETECTED Final   Coronavirus 229E NOT DETECTED NOT DETECTED Final    Comment: (NOTE) The Coronavirus on the Respiratory Panel, DOES NOT test for the novel  Coronavirus (2019 nCoV)    Coronavirus HKU1 NOT DETECTED NOT DETECTED Final   Coronavirus NL63 NOT DETECTED NOT DETECTED Final   Coronavirus OC43 NOT DETECTED NOT DETECTED Final   Metapneumovirus NOT DETECTED NOT DETECTED Final   Rhinovirus / Enterovirus NOT DETECTED NOT DETECTED Final   Influenza A NOT DETECTED NOT DETECTED Final   Influenza B NOT DETECTED NOT DETECTED Final   Parainfluenza Virus 1 NOT DETECTED NOT DETECTED Final   Parainfluenza Virus 2 NOT DETECTED NOT DETECTED Final   Parainfluenza Virus 3 NOT DETECTED NOT DETECTED Final   Parainfluenza Virus 4  NOT DETECTED NOT DETECTED Final   Respiratory Syncytial Virus NOT DETECTED NOT DETECTED Final   Bordetella pertussis NOT DETECTED NOT DETECTED Final   Bordetella Parapertussis NOT DETECTED NOT DETECTED Final   Chlamydophila pneumoniae NOT DETECTED NOT DETECTED Final   Mycoplasma pneumoniae NOT DETECTED NOT DETECTED Final    Comment: Performed at [facility], [address], [place], [place] [address]  Culture, blood (Routine X 2) w Reflex to ID Panel     Status: None   Collection Time: 11/09/23  6:16 PM   Specimen: BLOOD  Result Value Ref Range Status   Specimen Description   Final    BLOOD RIGHT ANTECUBITAL Performed at [facility], [address] [place], [place] [address]    Special Requests   Final    BOTTLES DRAWN AEROBIC ONLY Blood Culture adequate volume Performed at [facility], [address] [place], [place] [address]    Culture   Final    NO GROWTH 11 DAYS Performed at [facility], [address] [place], [place] [address]    Report Status 11/20/2023 FINAL  Final  Culture, blood (Routine X 2) w Reflex to ID Panel     Status: None   Collection Time: 11/09/23  6:23 PM   Specimen: BLOOD  Result Value Ref Range Status   Specimen Description   Final    BLOOD BLOOD LEFT FOREARM Performed at [facility], [address], [place], [place] [address]    Special Requests   Final    BOTTLES DRAWN AEROBIC AND ANAEROBIC Blood Culture adequate volume Performed at [facility], [address], [place], [place] [address]    Culture   Final    NO GROWTH 11 DAYS Performed at [facility], [address], [place], [place] [address]    Report Status 11/20/2023 FINAL  Final  Urine Culture     Status: Abnormal   Collection Time: 11/10/23  1:58 AM   Specimen: Urine, Random  Result Value Ref Range Status   Specimen Description   Final    URINE, RANDOM Performed at [facility], [address] [place], [place] [address]    Special Requests   Final    NONE Reflexed from [id number] Performed at [facility], [address] [place], [place] [address]    Culture 60,000 COLONIES/mL ENTEROCOCCUS FAECALIS (A)  Final   Report Status 11/12/2023 FINAL  Final   Organism ID, Bacteria ENTEROCOCCUS FAECALIS (A)  Final      Susceptibility   Enterococcus faecalis - MIC*    AMPICILLIN  <=2 SENSITIVE Sensitive     NITROFURANTOIN <=16 SENSITIVE Sensitive     VANCOMYCIN 1 SENSITIVE Sensitive     * 60,000 COLONIES/mL ENTEROCOCCUS FAECALIS  Culture, blood (x 2)     Status: None (Preliminary result)   Collection Time: 11/25/23 10:31 PM   Specimen: BLOOD   Result Value Ref Range Status   Specimen Description BLOOD BLOOD LEFT ARM  Final   Special Requests   Final    BOTTLES DRAWN AEROBIC AND ANAEROBIC Blood Culture adequate volume   Culture   Final    NO GROWTH < 12 HOURS Performed at [facility], [address] [place], [place] [address]    Report Status PENDING  Incomplete  Culture, blood (x 2)     Status: None (Preliminary result)   Collection Time: 11/25/23 10:31 PM   Specimen: BLOOD  Result Value Ref Range  Status   Specimen Description BLOOD BLOOD RIGHT ARM  Final   Special Requests   Final    BOTTLES DRAWN AEROBIC AND ANAEROBIC Blood Culture adequate volume   Culture   Final    NO GROWTH < 12 HOURS Performed at [facility], [address] [place], [place] [address]    Report Status PENDING  Incomplete    Coagulation Studies: Recent Labs    11/25/23 2231  LABPROT 15.5*  INR 1.2    Urinalysis: No results for input(s): COLORURINE, LABSPEC, PHURINE, GLUCOSEU, HGBUR, BILIRUBINUR, KETONESUR, PROTEINUR, UROBILINOGEN, NITRITE, LEUKOCYTESUR in the last 72 hours.  Invalid input(s): APPERANCEUR     Imaging: DG Chest 1 View Result Date: 11/25/2023 CLINICAL DATA:  Shortness of breath EXAM: PORTABLE CHEST 1 VIEW COMPARISON:  Film from earlier in the same day. FINDINGS: Dialysis catheter is again identified. Cardiac shadow is prominent but stable. Patient rotation accentuates the mediastinal markings. Central vascular congestion is noted with mild interstitial edema. Stable left basilar atelectasis is noted. IMPRESSION: Mild changes of CHF. Left basilar atelectasis. Electronically Signed   By: Oneil Devonshire M.D.   On: 11/25/2023 22:29   DG Chest Port 1 View Result Date: 11/25/2023 CLINICAL DATA:  Dyspnea.  Weakness EXAM: PORTABLE CHEST 1 VIEW COMPARISON:  11/11/2023 FINDINGS: Interval placement of a dual lumen dialysis catheter. Tips in the distal SVC. Stable cardiac silhouette. There is  new a peripheral interstitial linear markings consistent interstitial edema. LEFT basilar atelectasis and small effusion. No pneumothorax. IMPRESSION: 1. Interval placement of dialysis catheter. 2. Interstitial edema. 3. LEFT basilar atelectasis and small effusion. Electronically Signed   By: Jackquline Boxer M.D.   On: 11/25/2023 18:57        Medications:    ceFEPime (MAXIPIME) IV 1 g (11/26/23 0022)   feeding supplement (NEPRO CARB STEADY) 1,000 mL (11/26/23 0136)   metronidazole  500 mg (11/26/23 1025)   [START ON 11/27/2023] vancomycin      sodium chloride    Intravenous Once   Chlorhexidine  Gluconate Cloth  6 each Topical Q0600   epoetin  alfa-epbx (RETACRIT ) injection  10,000 Units Intravenous Q M,W,F-HD   feeding supplement (PROSource TF20)  60 mL Per Tube Daily   free water   60 mL Per Tube Q4H   gentamicin  ointment   Topical TID   heparin  injection (subcutaneous)  5,000 Units Subcutaneous Q8H   insulin  aspart  10 Units Subcutaneous Once   midodrine  5 mg Per Tube TID WC   multivitamin  1 tablet Per Tube QHS   pantoprazole  (PROTONIX ) IV  40 mg Intravenous Q12H   acetaminophen  **OR** acetaminophen , artificial tears, bisacodyl , ipratropium-albuterol , [DISCONTINUED] ondansetron  **OR** ondansetron  (ZOFRAN ) IV, mouth rinse  Assessment/ Plan:  Mr. Robert Vega is a 83 y.o.  male with obstructive sleep apnea, GERD, obesity, hypertension, dysphagia, severe esophageal dysmotility with achalasia status post PEG tube placement, recent aspiration pneumonia acute respiratory failure status post extubation 10/09/2023, who was admitted to [facility] on 10/05/2023 for Aspiration into respiratory tract, initial encounter [T17.908A] Severe sepsis (HCC) [A41.9, R65.20] History of dysphagia [Z87.898] Fever, unspecified fever cause [R50.9] Multifocal pneumonia [J18.9]   1.  Acute kidney injury.  Baseline creatinine 0.81 from 10/14/2023.  Acute kidney injury secondary to ATN, obstructive uropathy, and  progression of prerenal azotemia.   Patient is critically ill.  He was started on urgent hemodialysis for severe hyperkalemia and uremia. - Vascular surgery placed PermCath on 6/26. Robert Vega dialysis coordinator notified of need for outpatient dialysis clinic.  Search  in progress. - Patient received dialysis yesterday, experienced some episodes of bradycardia during treatment.  UF 1.2 L achieved.  Treatment terminated early due to hypotension.  Blood pressure recovered prior to exiting unit. - Next treatment scheduled for Wednesday -Patient continues to have no urine output despite increase free water  intake.  Will maintain current volume unless negative effects are recorded.   2.  Severe hyperkalemia-potassium 4.9   3.  Sepsis due to aspiration pneumonia, found to have distal esophageal obstruction with severe dysmotility.  He is status post botulinum toxin injection to the lower esophageal sphincter.  PEG tube placed this admission. Receiving continuous tube feeds with scheduled free water  flushes.   4.Anemia in the setting of renal failure Lab Results  Component Value Date   HGB 7.4 (L) 11/26/2023  Hemoglobin decreased to 7.4.  Patient has received blood transfusions during this admission.  Continue Epogen  10,000 units IV with dialysis.     5.  Bilateral hydronephrosis Noted on CT.Urology team has evaluated the patient and it is thought to be secondary to reflux.  Due to health, further intervention deferred at this time.     LOS: 52 Robert Vega 7/1/20251:07 PM

## 2023-11-26 NOTE — Progress Notes (Signed)
 Occupational Therapy Treatment Patient Details Name: Robert Vega MRN: [id number] DOB: Jul 09, 1940 Today's Date: 11/26/2023   History of present illness 83 y.o male with significant PMH of OSA, GERD, Obesity, HTN, Dysphagia: EGD 03/2022 with food in upper esophagus complicated by aspiration event, cardiac arrest with round of CPR, and post resuscitation EGD with concern for lack of peristalsis. Pt presented to ED on 10/05/2023 with hypoxia, fever and generalized weakness; developed acute respiratory failure requiring intubation 5/10 due to aspiration pneumonia, extubated 5/15, but had significant agitation required brief course of Precedex ; pt now s/p exploratory laparotomy with closure of gastrotomy and insertion of gastrostomy tube on 10/22/23.  PT order discontinued by MD 11/10/23 and new PT consult received 11/14/23.  Pt s/p R temporary femoral vein dialysis catheter placement 11/11/23.   OT comments  Mr Trefz seen for OT treatment on this date. Upon arrival to room pt in bed, agreeable to tx. Pt requires MAX A +2 physical assist for bed mobility; demonstrated increased WOB when initially sitting EOB, SpO2 95% on 4L of O2. Pt required MAX A due to posterior lean, intermittently improved to CGA w/ activities; demonstrated good sitting tolerance at EOB which progressed throughout session to 2 min. with CGA when provided activity. Pt required MIN A for seated grooming task. Pt making good progress toward goals, will continue to follow POC. Discharge recommendation remains appropriate.        If plan is discharge home, recommend the following:  Two people to help with walking and/or transfers;Two people to help with bathing/dressing/bathroom   Equipment Recommendations  Other (comment)    Recommendations for Other Services      Precautions / Restrictions Precautions Precautions: Fall Recall of Precautions/Restrictions: Impaired Precaution/Restrictions Comments: PEG,perm cath  (HD) Restrictions Weight Bearing Restrictions Per Provider Order: No       Mobility Bed Mobility Overal bed mobility: Needs Assistance Bed Mobility: Supine to Sit, Sit to Supine     Supine to sit: Max assist, +2 for physical assistance Sit to supine: Max assist, +2 for physical assistance        Transfers                   General transfer comment: did not attempt     Balance Overall balance assessment: Needs assistance Sitting-balance support: Feet supported, Bilateral upper extremity supported Sitting balance-Leahy Scale: Poor Sitting balance - Comments: fluctuated b/w CGA and MAX A due to posterior lean                                   ADL either performed or assessed with clinical judgement   ADL Overall ADL's : Needs assistance/impaired                                       General ADL Comments: MIN A face washing sitting EOB    Extremity/Trunk Assessment Upper Extremity Assessment Upper Extremity Assessment: Generalized weakness   Lower Extremity Assessment Lower Extremity Assessment: Generalized weakness        Vision       Perception     Praxis     Communication Communication Communication: Impaired Factors Affecting Communication: Reduced clarity of speech   Cognition Arousal: Alert Behavior During Therapy: WFL for tasks assessed/performed Cognition: Cognition impaired   Orientation impairments: Place, Time, Situation  OT - Cognition Comments: pt oriented to self, aware of relation to 1 of 2 visitors present                 Following commands: Impaired Following commands impaired: Follows one step commands inconsistently, Follows one step commands with increased time      Cueing   Cueing Techniques: Verbal cues, Gestural cues, Tactile cues  Exercises      Shoulder Instructions       General Comments BP lying 96/51, seated EOB 121/112; SpO2 95% on 4L O2    Pertinent  Vitals/ Pain       Pain Assessment Pain Assessment: PAINAD Breathing: occasional labored breathing, short period of hyperventilation Negative Vocalization: occasional moan/groan, low speech, negative/disapproving quality Facial Expression: smiling or inexpressive Body Language: relaxed Consolability: no need to console PAINAD Score: 2 Pain Location: generalized with mobility Pain Descriptors / Indicators: Discomfort, Grimacing Pain Intervention(s): Limited activity within patient's tolerance, Monitored during session, Repositioned  Home Living Family/patient expects to be discharged to:: Private residence Living Arrangements: Alone Available Help at Discharge: Family;Available 24 hours/day Type of Home: House Home Access: Stairs to enter [employer] of Steps: 2   Home Layout: One level     Bathroom Shower/Tub: Tub/shower unit         Home Equipment: None          Prior Functioning/Environment              Frequency  Min 1X/week        Progress Toward Goals  OT Goals(current goals can now be found in the care plan section)     Acute Rehab OT Goals Patient Stated Goal: unable to verbalize OT Goal Formulation: Patient unable to participate in goal setting Time For Goal Achievement: 12/10/23 Potential to Achieve Goals: Fair  Plan      Co-evaluation    PT/OT/SLP Co-Evaluation/Treatment: Yes Reason for Co-Treatment: Necessary to address cognition/behavior during functional activity;For patient/therapist safety;To address functional/ADL transfers   OT goals addressed during session: ADL's and self-care      AM-PAC OT 6 Clicks Daily Activity     Outcome Measure   Help from another person eating meals?: A Lot Help from another person taking care of personal grooming?: A Lot Help from another person toileting, which includes using toliet, bedpan, or urinal?: A Lot Help from another person bathing (including washing, rinsing, drying)?: A  Lot Help from another person to put on and taking off regular upper body clothing?: A Lot Help from another person to put on and taking off regular lower body clothing?: A Lot 6 Click Score: 12    End of Session    OT Visit Diagnosis: Unsteadiness on feet (R26.81);Repeated falls (R29.6);Muscle weakness (generalized) (M62.81)   Activity Tolerance Patient tolerated treatment well   Patient Left in bed;with call bell/phone within reach;with bed alarm set;with family/visitor present   Nurse Communication          Time: 8967-8895 OT Time Calculation (min): 32 min  Charges: OT General Charges $OT Visit: 1 Visit OT Treatments $Self Care/Home Management : 8-22 mins  [place], Student OT   [employer] 11/26/2023, 1:11 PM

## 2023-11-26 NOTE — TOC Progression Note (Signed)
 Transition of Care [facility]) - Progression Note    Patient Details  Name: Robert Vega MRN: [id number] Date of Birth: [birth year]  Transition of [facility]) CM/SW Contact  Lauraine [name], LCSW Phone Number: 11/26/2023, 3:14 PM  Clinical Narrative: Per HD coordinator, [facility] still does not have any MWF chair times available. CSW sent SNF referral to other local facilities to see if anyone else can offer.    Expected Discharge Plan and Services                                               Social Determinants of Health (SDOH) Interventions SDOH Screenings   Food Insecurity: Patient Unable To Answer (10/06/2023)  Recent Concern: Food Insecurity - Food Insecurity Present (09/11/2023)   Received from [facility]  Housing: Patient Unable To Answer (10/06/2023)  Recent Concern: Housing - High Risk (09/11/2023)   Received from [facility]  Transportation Needs: Patient Unable To Answer (10/06/2023)  Utilities: Patient Unable To Answer (10/06/2023)  Depression (PHQ2-9): Low Risk  (05/07/2023)  Financial Resource Strain: Medium Risk (09/11/2023)   Received from [facility]  Social Connections: Unknown (10/06/2023)  Tobacco Use: Medium Risk (11/05/2023)    Readmission Risk Interventions     No data to display

## 2023-11-26 NOTE — Progress Notes (Signed)
 Physical Therapy Treatment Patient Details Name: Robert Vega MRN: [id number] DOB: [birth year] Today's Date: 11/26/2023   History of Present Illness 83 y.o male with significant PMH of OSA, GERD, Obesity, HTN, Dysphagia: EGD 03/2022 with food in upper esophagus complicated by aspiration event, cardiac arrest with round of CPR, and post resuscitation EGD with concern for lack of peristalsis. Pt presented to ED on 10/05/2023 with hypoxia, fever and generalized weakness; developed acute respiratory failure requiring intubation 5/10 due to aspiration pneumonia, extubated 5/15, but had significant agitation required brief course of Precedex ; pt now s/p exploratory laparotomy with closure of gastrotomy and insertion of gastrostomy tube on 10/22/23.  PT order discontinued by MD 11/10/23 and new PT consult received 11/14/23.  Pt s/p R temporary femoral vein dialysis catheter placement 11/11/23; now removed; pt now with HD perm cath.    PT Comments  PT/OT co-treatment performed.  Pt oriented to name, month/year of DOB, and said yes that he was in the hospital and then that he was in a hotel.  During session pt was max assist x2 with bed mobility (significant posterior push noted with trunk when attempting to sit up on EOB); pt fluctuating between min to max assist with sitting balance (d/t posterior and R lean) but able to sit with CGA for 55 seconds, 50 seconds, and then 2 minutes while performing ADL's sitting on EOB.  Will continue to focus on strengthening, balance, endurance, and progressive functional mobility during hospitalization.  PT goals reviewed and updated as appropriate.   If plan is discharge home, recommend the following: A lot of help with bathing/dressing/bathroom;Assistance with cooking/housework;Assistance with feeding;Direct supervision/assist for medications management;Direct supervision/assist for financial management;Assist for transportation;Help with stairs or ramp for entrance;Supervision  due to cognitive status;Two people to help with walking and/or transfers   Can travel by private vehicle      No  Equipment Recommendations  Other (comment) (Defer to next level of care)    Recommendations for Other Services       Precautions / Restrictions Precautions Precautions: Fall Recall of Precautions/Restrictions: Impaired Precaution/Restrictions Comments: PEG; perm cath (HD) Restrictions Weight Bearing Restrictions Per Provider Order: No Other Position/Activity Restrictions: Safety mitts donned     Mobility  Bed Mobility Overal bed mobility: Needs Assistance Bed Mobility: Supine to Sit, Sit to Supine     Supine to sit: Max assist, +2 for physical assistance Sit to supine: Max assist, +2 for physical assistance   General bed mobility comments: assist for trunk and B LE's; pt with significant posterior push with trunk when attempting to come to sitting requiring cueing and assist to correct; vc's for technique; 2 assist to boost up in bed end of session    Transfers                   General transfer comment: Deferred d/t safety concerns    Ambulation/Gait                   Stairs             Wheelchair Mobility     Tilt Bed    Modified Rankin (Stroke Patients Only)       Balance Overall balance assessment: Needs assistance Sitting-balance support: Feet supported, Bilateral upper extremity supported Sitting balance-Leahy Scale: Poor Sitting balance - Comments: Pt fluctuating between CGA to min to max assist d/t posterior and R lean; pt able to sit on EOB approximately 10-15 minutes  Communication Communication Communication: Impaired Factors Affecting Communication: Reduced clarity of speech  Cognition Arousal: Alert Behavior During Therapy: WFL for tasks assessed/performed   PT - Cognitive impairments: Orientation, Awareness, Memory, Attention, Initiation, Sequencing,  Problem solving, Safety/Judgement   Orientation impairments: Place, Time, Situation                   PT - Cognition Comments: Pt able to state name and month/year of birthday; pt unable to state where he was despite given multiple choice (when asked if he was in a hospital pt stated yes; when then asked if he was in a hotel pt stated yes); pt able to recognize 1/2 visitors when they arrived during session Following commands: Impaired Following commands impaired: Follows one step commands inconsistently, Follows one step commands with increased time    Cueing Cueing Techniques: Verbal cues, Gestural cues, Tactile cues, Visual cues  Exercises General Exercises - Lower Extremity Long Arc Quad: AROM, Strengthening, Both, Seated (x10 R and x5 L (decreased AROM noted))    General Comments General comments (skin integrity, edema, etc.): Resting in bed beginning of session pt's BP 96/51 (MAP 65) with HR 80 bpm and SpO2 sats 95% on 4L O2 via nasal cannula; sitting EOB pt's BP 121/112 (MAP 118) with HR 86 bpm and SpO2 sats 95% on 4 L O2 via nasal cannula; HR 85 bpm at rest end of session.  Nurse updated regarding pt's vitals during session.  R head/cervical rotation noted in general during session.  Nurse reporting pt's granddaughter requesting update from PT.  PT called pt's granddaughter (who was able to give appropriate password) and updated her on pt's status (in regards to physical therapy).  Pt's granddaughter concerned about pt being able to get up to recliner daily (has order); discussed with mobility specialists to assist pt with this on Tues/Thursday (non-dialysis days) and also with nursing for the other days (using hoyer lift).  Called [employer] to get pressure redistribution pad for pt to use when in recliner (pt already with order).      Pertinent Vitals/Pain Pain Assessment Pain Assessment: PAINAD Breathing: occasional labored breathing, short period of  hyperventilation Negative Vocalization: occasional moan/groan, low speech, negative/disapproving quality Facial Expression: smiling or inexpressive Body Language: relaxed Consolability: no need to console PAINAD Score: 2 Pain Location: generalized with mobility Pain Descriptors / Indicators: Discomfort, Grimacing Pain Intervention(s): Limited activity within patient's tolerance, Monitored during session    Home Living                          Prior Function            PT Goals (current goals can now be found in the care plan section) Acute Rehab PT Goals Patient Stated Goal: none stated PT Goal Formulation: Patient unable to participate in goal setting Time For Goal Achievement: 12/10/23 Potential to Achieve Goals: Fair Progress towards PT goals: Progressing toward goals;Goals updated (pt fluctuating with status)    Frequency    Min 2X/week      PT Plan      Co-evaluation PT/OT/SLP Co-Evaluation/Treatment: Yes Reason for Co-Treatment: Necessary to address cognition/behavior during functional activity;For patient/therapist safety;To address functional/ADL transfers PT goals addressed during session: Mobility/safety with mobility;Balance;Proper use of DME;Strengthening/ROM OT goals addressed during session: ADL's and self-care      AM-PAC PT 6 Clicks Mobility   Outcome Measure  Help needed turning from your back to your side while in a flat  bed without using bedrails?: A Lot Help needed moving from lying on your back to sitting on the side of a flat bed without using bedrails?: Total Help needed moving to and from a bed to a chair (including a wheelchair)?: Total Help needed standing up from a chair using your arms (e.g., wheelchair or bedside chair)?: Total Help needed to walk in hospital room?: Total Help needed climbing 3-5 steps with a railing? : Total 6 Click Score: 7    End of Session Equipment Utilized During Treatment: Oxygen Activity  Tolerance: Patient tolerated treatment well;Patient limited by fatigue Patient left: in bed;with call bell/phone within reach;with bed alarm set;with family/visitor present;Other (comment) (HOB elevated >30 degrees; B heels floating via pillow support; pillows placed on pt's R side to promote midline positioning) Nurse Communication: Mobility status;Precautions;Other (comment) (Pt's vitals during session; pt's granddaughters request for pt to be in chair every day (has current order for this); use overhead hoyer to get to/from chair) PT Visit Diagnosis: Other abnormalities of gait and mobility (R26.89);Muscle weakness (generalized) (M62.81) Pain - part of body:  (abdomen)     Time: 1030-1104 PT Time Calculation (min) (ACUTE ONLY): 34 min  Charges:    $Therapeutic Activity: 8-22 mins PT General Charges $$ ACUTE PT VISIT: 1 Visit                    Damien Caulk, PT 11/26/23, 4:54 PM

## 2023-11-27 DIAGNOSIS — A419 Sepsis, unspecified organism: Secondary | ICD-10-CM | POA: Diagnosis not present

## 2023-11-27 DIAGNOSIS — R6520 Severe sepsis without septic shock: Secondary | ICD-10-CM | POA: Diagnosis not present

## 2023-11-27 LAB — CBC
HCT: 23.7 % — ABNORMAL LOW (ref 39.0–52.0)
Hemoglobin: 7.4 g/dL — ABNORMAL LOW (ref 13.0–17.0)
MCH: 28.8 pg (ref 26.0–34.0)
MCHC: 31.2 g/dL (ref 30.0–36.0)
MCV: 92.2 fL (ref 80.0–100.0)
Platelets: 381 10*3/uL (ref 150–400)
RBC: 2.57 MIL/uL — ABNORMAL LOW (ref 4.22–5.81)
RDW: 14.9 % (ref 11.5–15.5)
WBC: 23.1 10*3/uL — ABNORMAL HIGH (ref 4.0–10.5)
nRBC: 0 % (ref 0.0–0.2)

## 2023-11-27 LAB — BASIC METABOLIC PANEL WITH GFR
Anion gap: 14 (ref 5–15)
BUN: 99 mg/dL — ABNORMAL HIGH (ref 8–23)
CO2: 23 mmol/L (ref 22–32)
Calcium: 7.8 mg/dL — ABNORMAL LOW (ref 8.9–10.3)
Chloride: 95 mmol/L — ABNORMAL LOW (ref 98–111)
Creatinine, Ser: 7.41 mg/dL — ABNORMAL HIGH (ref 0.61–1.24)
GFR, Estimated: 7 mL/min — ABNORMAL LOW (ref >60)
Glucose, Bld: 97 mg/dL (ref 70–99)
Potassium: 5.2 mmol/L — ABNORMAL HIGH (ref 3.5–5.1)
Sodium: 132 mmol/L — ABNORMAL LOW (ref 135–145)

## 2023-11-27 LAB — GLUCOSE, CAPILLARY
Glucose-Capillary: 114 mg/dL — ABNORMAL HIGH (ref 70–99)
Glucose-Capillary: 138 mg/dL — ABNORMAL HIGH (ref 70–99)
Glucose-Capillary: 111 mg/dL — ABNORMAL HIGH (ref 70–99)
Glucose-Capillary: 118 mg/dL — ABNORMAL HIGH (ref 70–99)

## 2023-11-27 LAB — MAGNESIUM: Magnesium: 2.7 mg/dL — ABNORMAL HIGH (ref 1.7–2.4)

## 2023-11-27 LAB — PHOSPHORUS: Phosphorus: 7.3 mg/dL — ABNORMAL HIGH (ref 2.5–4.6)

## 2023-11-27 MED ORDER — BISACODYL 10 MG RE SUPP
10.0000 mg | Freq: Once | RECTAL | Status: DC
  Administered 2023-11-27: 10 mg via RECTAL
  Filled 2023-11-27: qty 1

## 2023-11-27 MED ORDER — HEPARIN SODIUM (PORCINE) 1000 UNIT/ML IJ SOLN
INTRAMUSCULAR | Status: AC
  Filled 2023-11-27: qty 10

## 2023-11-27 MED ORDER — BISACODYL 10 MG RE SUPP
10.0000 mg | Freq: Once | RECTAL | Status: AC
  Administered 2023-11-27: 10 mg via RECTAL
  Filled 2023-11-27 (×2): qty 1

## 2023-11-27 MED ORDER — ALBUMIN HUMAN 25 % IV SOLN
INTRAVENOUS | Status: AC
Start: 2023-11-27 — End: 2023-11-27
  Filled 2023-11-27: qty 100

## 2023-11-27 MED ORDER — EPOETIN ALFA-EPBX 10000 UNIT/ML IJ SOLN
INTRAMUSCULAR | Status: AC
  Filled 2023-11-27: qty 1

## 2023-11-27 NOTE — TOC Progression Note (Signed)
 Transition of Care [facility]) - Progression Note    Patient Details  Name: Robert Vega MRN: [id number] Date of Birth: [birth year]  Transition of Care [facility]) CM/SW Contact  [place] L Willo Yoon, [place] Phone Number: 11/27/2023, 12:22 PM  Clinical Narrative:     Request made to [facility] for consideration.        Expected Discharge Plan and Services                                               Social Determinants of Health (SDOH) Interventions SDOH Screenings   Food Insecurity: Patient Unable To Answer (10/06/2023)  Recent Concern: Food Insecurity - Food Insecurity Present (09/11/2023)   Received from [facility]  Housing: Patient Unable To Answer (10/06/2023)  Recent Concern: Housing - High Risk (09/11/2023)   Received from [facility]  Transportation Needs: Patient Unable To Answer (10/06/2023)  Utilities: Patient Unable To Answer (10/06/2023)  Depression (PHQ2-9): Low Risk  (05/07/2023)  Financial Resource Strain: Medium Risk (09/11/2023)   Received from [facility]  Social Connections: Unknown (10/06/2023)  Tobacco Use: Medium Risk (11/05/2023)    Readmission Risk Interventions     No data to display

## 2023-11-27 NOTE — Plan of Care (Signed)
  Problem: Education: Goal: Knowledge of General Education information will improve Description: Including pain rating scale, medication(s)/side effects and non-pharmacologic comfort measures Outcome: Progressing   Problem: Clinical Measurements: Goal: Ability to maintain clinical measurements within normal limits will improve Outcome: Progressing Goal: Will remain free from infection Outcome: Progressing Goal: Diagnostic test results will improve Outcome: Progressing Goal: Respiratory complications will improve Outcome: Progressing Goal: Cardiovascular complication will be avoided Outcome: Progressing   Problem: Activity: Goal: Risk for activity intolerance will decrease Outcome: Progressing   Problem: Nutrition: Goal: Adequate nutrition will be maintained Outcome: Progressing   Problem: Coping: Goal: Level of anxiety will decrease Outcome: Progressing   Problem: Pain Managment: Goal: General experience of comfort will improve and/or be controlled Outcome: Progressing   Problem: Safety: Goal: Ability to remain free from injury will improve Outcome: Progressing   Problem: Skin Integrity: Goal: Risk for impaired skin integrity will decrease Outcome: Progressing

## 2023-11-27 NOTE — Progress Notes (Addendum)
 Progress Note   Patient: Robert Vega FMW:[id number] DOB: 1940-10-20 DOA: 10/05/2023     53 DOS: the patient was seen and examined on 11/27/2023   Brief hospital course: HPI was taken from Dr. Eldonna: Robert Vega is a 83 y.o. male with medical history significant of hypertension, sleep apnea, obesity, GERD presenting with acute febrile hypoxia, sepsis  and pneumonia.  Patient noted to be overall poor historian.  Per report, patient with increased work of breathing generalized weakness over the past 12 to 24 hours.  Was initially evaluated urgent care however EMS had to be called.  No severe weakness.  Mild cough per report.  No chest pain or abdominal pain.  No reported nausea or vomiting.  Seen by EMS with noted fever 100.7 and route.  Hypoxic to the mid 80s on room air. Presented to the ER Tmax 22.1, heart rate 90s, respirations mid 20s, BP 80s to 130s.  Requiring 4 L nasal cannula to keep O2 sats greater than 92%.  White count 7.7, hemoglobin 12, platelets 216, lactate 2.2.  COVID flu RSV negative.  Creatinine 1.  Glucose 152.  Chest x-ray with bilateral lower lobe pneumonia.  Positive pulmonary vascular congestion.   As per Dr. Lenon: Robert Vega is a 83 y.o male with significant PMH of OSA, GERD, Obesity, HTN, Dysphagia - presented to the ED 10/05/2023 from with hypoxia, fever and generalized weakness. Dx sepsis/pneumonia, concern for aspiration    Of note, EGD 03/2022 with food in upper esophagus complicated by aspiration event, cardiac arrest with round of CPR, and post resuscitation EGD with concern for lack of peristalsis. Hx prior EGD 08/2020 with note of abnormal cricopharyngeus, decrease in motility in esophagus, and spastic LES    5/10: Admit to [facility] service with sepsis due to Aspiration Pneumonia.  Course complicated by Acute Respiratory Failure due to Aspiration of vomitus w/ cardiac arrest transfer to ICU and intubation.  5/11: flexible bronchoscopy  5/13: PEG tube,  +vomiting last night, failed SAT/SBT 5/14: extubated but with severe delirium 5/20:  Botulinum toxin injection into the lower esophageal sphincter by Dr. Jinny 5/21: esophagram showing diffusely distended esophagus with distal obstruction and severe dysmotility suggestive of achalasia. At that point patient was stabilized to point of pursuing SNF placement but then with complications of PEG tube placement resulted in return to OR 5/27 and tube feeds restarted 5/28 with zosyn  for peritonitis.  Had new melena 6/9 so underwent another EGD without acute bleeding seen.  Renal function declined and nephrology was consulted and without improvement with conservative management, he received dialysis access and started on HD 6/16 as this was in line with family goals of care.  Discharge will now be pending arrangement of facility and outpatient HD if he does not have renal recovery.  He remains in poor prognosis state with intermittently needing to hold tube feeds again for intolerance and needed blood transfusion 6/18 for anemia likely related more to the renal failure as no source of bleeding has been identified thus far. Mental status is oriented to self and location and able to follow simple commands but otherwise confused. PT/OT recommends SNF. Permcath placement scheduled for this am.     As per Dr. Trudy 6/28-11/26/23: Pt is very sick. Overnight, 06/30 pt spiked a low grade fever and was started on IV broad sprectrum abxs, IV flagyl , vanco, cefepime . CT chest/abd/pelvis ordered. Pt's granddaughter is requesting ID consult. Please consult ID in AM as per granddaughter's request. Pt is full  code and full scope of care despite pt having failure to thrive. Please call pt's granddaughter, [name] w/ updates.     Assessment and Plan:  Failure to thrive: secondary to all below. Continue w/ full code, full scope of care as per pt's granddaughter.    SIRS: started on night on 11/25/23. W/ leukocytosis,  tachypnea, fever and unknown source.  Started on IV broad sprectrum abxs, IV flagyl , vanco, cefepime .  Patient had a CT scan of the chest/abdomen/pelvis which showed moderate bilateral hydronephrosis and hydroureter to the ureterovesicular junction without calculus or other visible obstruction likely secondary to ureterovesicular junction due to bladder wall edema. Interval increase in extensive bilateral simple attenuation perinephric fluid. Pleural effusion similar to prior examination.  Anasarca. Cholelithiasis. Percutaneous gastrostomy. Coronary artery disease. Patient noted to have worsening leukocytosis Follow-up results of blood cultures    AKI:  Hyperkalemia: Managed by renal replacement therapy Secondary to ATN as per nephro.  On HD MWF.  Renal replacement therapy was discontinued  early b/c of hypotension & bradycardia. Nephro following and recs apprec       Possible ileus:  Abdomen remains distended Monitor closely   Anemia of chronic kidney disease likely secondary to CKD. S/p 2 units of pRBCs transfused so far.  Continue iron supplementation   Achalasia & dysphagia: w/ high aspiration risk.  S/p botulinum toxin injection on 5/20, several EGD and SLP evals.  Not safe for PO intake unless family wants to accept risk that he will aspirate again. Continue w/ tube feeds    Severe sepsis: Resolved. Initially w/ aspiration pna. Later in hospital stay w/ intra-abdominal infection secondary to PEG dislodged.   Acute hypoxic & hypercapnic respiratory failure:  s/p intubation, ventilation & extubation.  Continue on supplemental oxygen and wean as tolerated    Acute on chronic diastolic dysfunction CHF  XR showed interstitial edema/CHF.  Fluid/volume management w/ HD     Delirium: vs mild cognitive impairment vs anoxic brain injury during critical illness in the ICU, requiring intubation & pressors. Continue w/ supportive care. Granddaughter asked to d/c prn  antipsychotics. Continue w/ supportive care    GERD: continue on PPI    HTN: BP is on the low end of normal     Generalized weakness: PT/OT recs SNF   Obesity: BMI 32.8.   Moderate malnutrition Secondary to acute illness Continue tube feeds       Subjective: Response to sternal rub.  Physical Exam: Vitals:   11/26/23 1550 11/26/23 2208 11/27/23 0523 11/27/23 0921  BP: 102/71 137/64 119/72 (!) 108/55  Pulse: 88 76 74 74  Resp: (!) 27 18 (!) 22 (!) 23  Temp: 97.7 F (36.5 C) 99.2 F (37.3 C)  98.1 F (36.7 C)  TempSrc: Axillary     SpO2: 92% 94% 92% 95%  Weight:      Height:       General exam: Lying in bed.  Respiratory system: decreased breath sounds b/l  Cardiovascular system: S1 & S2+. No rubs or clicks    Gastrointestinal system: abd is soft, NT, obese, hypoactive bowel sounds  Central nervous system:  lethargic, responds to painful stimuli Psychiatry: Unable to assess    Data Reviewed: Sodium 132, potassium 5.2, BUN 99, creatinine 7.41, white count 23.1 hemoglobin 7.4 Labs reviewed  Family Communication: None  Disposition: Status is: Inpatient Remains inpatient appropriate because: On broad-spectrum antibiotic therapy  Planned Discharge Destination: TBD    Time spent: 50 minutes  Author: Aimee Somerset, MD 11/27/2023 2:38  PM  For on call review [email or web address].

## 2023-11-27 NOTE — Progress Notes (Signed)
 [facility]  ROUNDING NOTE   Subjective:   Patient resting comfortably in bed. Remains a bit lethargic. Due for dialysis treatment today.  Objective:  Vital signs in last 24 hours:  Temp:  [97.5 F (36.4 C)-99.2 F (37.3 C)] 97.5 F (36.4 C) (07/02 1613) Pulse Rate:  [74-80] 80 (07/02 1613) Resp:  [18-25] 25 (07/02 1613) BP: (108-137)/(55-72) 125/64 (07/02 1613) SpO2:  [91 %-95 %] 91 % (07/02 1613)  Weight change:  Filed Weights   11/20/23 1410 11/25/23 0853 11/25/23 1300  Weight: 100.9 kg 101.9 kg 100.7 kg    Intake/Output: No intake/output data recorded.   Intake/Output this shift:  Total I/O In: 1680 [NG/GT:1080; IV Piggyback:600] Out: -   Physical Exam: General: Chronically ill-appearing  Head: Oral mucosa moist  Eyes: Anicteric  Lungs:  Diminished  Heart: Regular rate and rhythm  Abdomen:  Peg tube in place   Extremities: Trace peripheral edema.  Neurologic: Alert and oriented to self  Skin: No lesions  Access: Rt internal jugular permcath    Basic Metabolic Panel: Recent Labs  Lab 11/23/23 0415 11/24/23 0404 11/25/23 0437 11/25/23 2158 11/26/23 0409 11/27/23 0452  NA 132* 129* 131* 132* 134* 132*  K 4.6 4.8 5.4* 4.5 4.9 5.2*  CL 95* 94* 91* 94* 96* 95*  CO2 26 24 23 26 24 23   GLUCOSE 120* 121* 131* 123* 137* 97  BUN 56* 85* 100* 63* 72* 99*  CREATININE 4.76* 6.51* 7.71* 4.95* 5.64* 7.41*  CALCIUM  7.5* 7.4* 7.9* 7.6* 7.6* 7.8*  MG  --   --   --   --   --  2.7*  PHOS 5.1*  --   --   --   --  7.3*    Liver Function Tests: Recent Labs  Lab 11/23/23 0415 11/25/23 2158  AST  --  19  ALT  --  13  ALKPHOS  --  77  BILITOT  --  0.2  PROT  --  6.7  ALBUMIN 1.5* 1.8*   No results for input(s): LIPASE, AMYLASE in the last 168 hours. No results for input(s): AMMONIA in the last 168 hours.  CBC: Recent Labs  Lab 11/24/23 0404 11/25/23 0437 11/25/23 2158 11/26/23 0409 11/27/23 0452  WBC 16.7* 20.3* 20.9*  20.8*  21.0* 23.1*  NEUTROABS  --   --  17.0*  --   --   HGB 7.8* 8.3* 7.6*  7.5* 7.4* 7.4*  HCT 24.2* 25.4* 23.5*  23.3* 23.4* 23.7*  MCV 90.0 88.8 91.4  91.4 92.1 92.2  PLT 330 363 350  332 331 381    Cardiac Enzymes: No results for input(s): CKTOTAL, CKMB, CKMBINDEX, TROPONINI in the last 168 hours.  BNP: Invalid input(s): POCBNP  CBG: Recent Labs  Lab 11/26/23 1459 11/26/23 1625 11/27/23 0922 11/27/23 1215 11/27/23 1616  GLUCAP 123* 115* 114* 138* 111*    Microbiology: Results for orders placed or performed during the hospital encounter of 10/05/23  Blood Culture (routine x 2)     Status: None   Collection Time: 10/05/23  5:06 AM   Specimen: BLOOD  Result Value Ref Range Status   Specimen Description BLOOD LA  Final   Special Requests   Final    BOTTLES DRAWN AEROBIC AND ANAEROBIC Blood Culture results may not be optimal due to an inadequate volume of blood received in culture bottles   Culture   Final    NO GROWTH 5 DAYS Performed at [facility], [address]  [place], [place] [address]    Report Status 10/10/2023 FINAL  Final  Blood Culture (routine x 2)     Status: None   Collection Time: 10/05/23  5:07 AM   Specimen: BLOOD  Result Value Ref Range Status   Specimen Description BLOOD RA  Final   Special Requests   Final    BOTTLES DRAWN AEROBIC AND ANAEROBIC Blood Culture results may not be optimal due to an inadequate volume of blood received in culture bottles   Culture   Final    NO GROWTH 5 DAYS Performed at [facility], [address] [place], [place] [address]    Report Status 10/10/2023 FINAL  Final  Resp panel by RT-PCR (RSV, Flu A&B, Covid) Anterior Nasal Swab     Status: None   Collection Time: 10/05/23  5:56 AM   Specimen: Anterior Nasal Swab  Result Value Ref Range Status   SARS Coronavirus 2 by RT PCR NEGATIVE NEGATIVE Final    Comment: (NOTE) SARS-CoV-2 target nucleic acids are NOT DETECTED.  The SARS-CoV-2  RNA is generally detectable in upper respiratory specimens during the acute phase of infection. The lowest concentration of SARS-CoV-2 viral copies this assay can detect is 138 copies/mL. A negative result does not preclude SARS-Cov-2 infection and should not be used as the sole basis for treatment or other patient management decisions. A negative result may occur with  improper specimen collection/handling, submission of specimen other than nasopharyngeal swab, presence of viral mutation(s) within the areas targeted by this assay, and inadequate number of viral copies(<138 copies/mL). A negative result must be combined with clinical observations, patient history, and epidemiological information. The expected result is Negative.  Fact Sheet for Patients:  BloggerCourse.com  Fact Sheet for Healthcare Providers:  SeriousBroker.it  This test is no t yet approved or cleared by the [place]  FDA and  has been authorized for detection and/or diagnosis of SARS-CoV-2 by FDA under an Emergency Use Authorization (EUA). This EUA will remain  in effect (meaning this test can be used) for the duration of the COVID-19 declaration under Section 564(b)(1) of the Act, 21 U.S.C.section 360bbb-3(b)(1), unless the authorization is terminated  or revoked sooner.       Influenza A by PCR NEGATIVE NEGATIVE Final   Influenza B by PCR NEGATIVE NEGATIVE Final    Comment: (NOTE) The Xpert Xpress SARS-CoV-2/FLU/RSV plus assay is intended as an aid in the diagnosis of influenza from Nasopharyngeal swab specimens and should not be used as a sole basis for treatment. Nasal washings and aspirates are unacceptable for Xpert Xpress SARS-CoV-2/FLU/RSV testing.  Fact Sheet for Patients: BloggerCourse.com  Fact Sheet for Healthcare Providers: SeriousBroker.it  This test is not yet approved or cleared by the  [place]  FDA and has been authorized for detection and/or diagnosis of SARS-CoV-2 by FDA under an Emergency Use Authorization (EUA). This EUA will remain in effect (meaning this test can be used) for the duration of the COVID-19 declaration under Section 564(b)(1) of the Act, 21 U.S.C. section 360bbb-3(b)(1), unless the authorization is terminated or revoked.     Resp Syncytial Virus by PCR NEGATIVE NEGATIVE Final    Comment: (NOTE) Fact Sheet for Patients: BloggerCourse.com  Fact Sheet for Healthcare Providers: SeriousBroker.it  This test is not yet approved or cleared by the [place]  FDA and has been authorized for detection and/or diagnosis of SARS-CoV-2 by FDA under an Emergency Use Authorization (EUA). This EUA will remain in effect (meaning this test can be used) for  the duration of the COVID-19 declaration under Section 564(b)(1) of the Act, 21 U.S.C. section 360bbb-3(b)(1), unless the authorization is terminated or revoked.  Performed at [facility], [address] [place], [place] [address]   Respiratory (~20 pathogens) panel by PCR     Status: None   Collection Time: 10/05/23  8:11 AM   Specimen: Nasopharyngeal Swab; Respiratory  Result Value Ref Range Status   Adenovirus NOT DETECTED NOT DETECTED Final   Coronavirus 229E NOT DETECTED NOT DETECTED Final    Comment: (NOTE) The Coronavirus on the Respiratory Panel, DOES NOT test for the novel  Coronavirus (2019 nCoV)    Coronavirus HKU1 NOT DETECTED NOT DETECTED Final   Coronavirus NL63 NOT DETECTED NOT DETECTED Final   Coronavirus OC43 NOT DETECTED NOT DETECTED Final   Metapneumovirus NOT DETECTED NOT DETECTED Final   Rhinovirus / Enterovirus NOT DETECTED NOT DETECTED Final   Influenza A NOT DETECTED NOT DETECTED Final   Influenza B NOT DETECTED NOT DETECTED Final   Parainfluenza Virus 1 NOT DETECTED NOT DETECTED Final   Parainfluenza Virus 2  NOT DETECTED NOT DETECTED Final   Parainfluenza Virus 3 NOT DETECTED NOT DETECTED Final   Parainfluenza Virus 4 NOT DETECTED NOT DETECTED Final   Respiratory Syncytial Virus NOT DETECTED NOT DETECTED Final   Bordetella pertussis NOT DETECTED NOT DETECTED Final   Bordetella Parapertussis NOT DETECTED NOT DETECTED Final   Chlamydophila pneumoniae NOT DETECTED NOT DETECTED Final   Mycoplasma pneumoniae NOT DETECTED NOT DETECTED Final    Comment: Performed at [facility], [address], [place], [place] [address]  Expectorated Sputum Assessment w Gram Stain, Rflx to Resp Cult     Status: None   Collection Time: 10/05/23  9:07 AM   Specimen: Sputum  Result Value Ref Range Status   Specimen Description SPUTUM  Final   Special Requests NONE  Final   Sputum evaluation   Final    Sputum specimen not acceptable for testing.  Please recollect.   C/KERRY NELSON AT 1005 10/05/23.PMF Performed at [facility], [address] [place], [place] [address]    Report Status 10/05/2023 FINAL  Final  Expectorated Sputum Assessment w Gram Stain, Rflx to Resp Cult     Status: None   Collection Time: 10/05/23 10:50 AM  Result Value Ref Range Status   Specimen Description EXPECTORATED SPUTUM  Final   Special Requests NONE  Final   Sputum evaluation   Final    THIS SPECIMEN IS ACCEPTABLE FOR SPUTUM CULTURE Performed at [facility], [address], [place], [place] [address]    Report Status 10/05/2023 FINAL  Final  Culture, Respiratory w Gram Stain     Status: None   Collection Time: 10/05/23 10:50 AM  Result Value Ref Range Status   Specimen Description   Final    EXPECTORATED SPUTUM Performed at [facility], [address], [place], [place] [address]    Special Requests   Final    NONE Reflexed from [id number] Performed at [facility], [address] [place], [place] [address]    Gram Stain   Final    RARE WBC SEEN RARE VONNE POSITIVE RODS RARE VONNE  POSITIVE COCCI RARE GRAM NEGATIVE RODS    Culture   Final    FEW Normal respiratory flora-no Staph aureus or Pseudomonas seen Performed at [facility], [address] [place], [place] [address]    Report Status 10/07/2023 FINAL  Final  MRSA Next Gen by PCR, Nasal     Status: None   Collection Time: 10/06/23 12:57 AM   Specimen: Nasal Mucosa; Nasal Swab  Result Value Ref Range Status   MRSA by PCR Next Gen NOT DETECTED NOT DETECTED Final    Comment: (NOTE) The GeneXpert MRSA Assay (FDA approved for NASAL specimens only), is one component of a comprehensive MRSA colonization surveillance program. It is not intended to diagnose MRSA infection nor to guide or monitor treatment for MRSA infections. Test performance is not FDA approved in patients less than 88 years old. Performed at [facility], [address] [place], [place] [address]   Culture, Respiratory w Gram Stain     Status: None   Collection Time: 10/06/23 11:37 AM   Specimen: INDUCED SPUTUM  Result Value Ref Range Status   Specimen Description   Final    INDUCED SPUTUM Performed at [facility], [address] [place], [place] [address]    Special Requests   Final    NONE Performed at [facility], [address] [place], [place] [address]    Gram Stain   Final    FEW WBC PRESENT,BOTH PMN AND MONONUCLEAR FEW GRAM POSITIVE RODS    Culture   Final    MODERATE LACTOBACILLUS FERMENTUM Standardized susceptibility testing for this organism is not available. Performed at [facility], [address], [place], [place] [address]    Report Status 10/09/2023 FINAL  Final  Culture, blood (x 2)     Status: None   Collection Time: 10/22/23  1:00 AM   Specimen: BLOOD  Result Value Ref Range Status   Specimen Description BLOOD BLOOD RIGHT ARM  Final   Special Requests   Final    BOTTLES DRAWN AEROBIC AND ANAEROBIC Blood Culture adequate volume   Culture   Final    NO GROWTH 5  DAYS Performed at [facility], [address], [place], [place] [address]    Report Status 10/27/2023 FINAL  Final  Culture, blood (x 2)     Status: None   Collection Time: 10/22/23  1:00 AM   Specimen: BLOOD  Result Value Ref Range Status   Specimen Description BLOOD BLOOD RIGHT ARM  Final   Special Requests   Final    BOTTLES DRAWN AEROBIC AND ANAEROBIC Blood Culture adequate volume   Culture   Final    NO GROWTH 5 DAYS Performed at [facility], [address] [place], [place] [address]    Report Status 10/27/2023 FINAL  Final  Resp panel by RT-PCR (RSV, Flu A&B, Covid) Anterior Nasal Swab     Status: None   Collection Time: 11/09/23  5:31 PM   Specimen: Anterior Nasal Swab  Result Value Ref Range Status   SARS Coronavirus 2 by RT PCR NEGATIVE NEGATIVE Final    Comment: (NOTE) SARS-CoV-2 target nucleic acids are NOT DETECTED.  The SARS-CoV-2 RNA is generally detectable in upper respiratory specimens during the acute phase of infection. The lowest concentration of SARS-CoV-2 viral copies this assay can detect is 138 copies/mL. A negative result does not preclude SARS-Cov-2 infection and should not be used as the sole basis for treatment or other patient management decisions. A negative result may occur with  improper specimen collection/handling, submission of specimen other than nasopharyngeal swab, presence of viral mutation(s) within the areas targeted by this assay, and inadequate number of viral copies(<138 copies/mL). A negative result must be combined with clinical observations,  patient history, and epidemiological information. The expected result is Negative.  Fact Sheet for Patients:  BloggerCourse.com  Fact Sheet for Healthcare Providers:  SeriousBroker.it  This test is no t yet approved or cleared by the [place]  FDA and  has been authorized for detection and/or diagnosis of SARS-CoV-2  by FDA under an Emergency Use Authorization (EUA). This EUA will remain  in effect (meaning this test can be used) for the duration of the COVID-19 declaration under Section 564(b)(1) of the Act, 21 U.S.C.section 360bbb-3(b)(1), unless the authorization is terminated  or revoked sooner.       Influenza A by PCR NEGATIVE NEGATIVE Final   Influenza B by PCR NEGATIVE NEGATIVE Final    Comment: (NOTE) The Xpert Xpress SARS-CoV-2/FLU/RSV plus assay is intended as an aid in the diagnosis of influenza from Nasopharyngeal swab specimens and should not be used as a sole basis for treatment. Nasal washings and aspirates are unacceptable for Xpert Xpress SARS-CoV-2/FLU/RSV testing.  Fact Sheet for Patients: BloggerCourse.com  Fact Sheet for Healthcare Providers: SeriousBroker.it  This test is not yet approved or cleared by the [place]  FDA and has been authorized for detection and/or diagnosis of SARS-CoV-2 by FDA under an Emergency Use Authorization (EUA). This EUA will remain in effect (meaning this test can be used) for the duration of the COVID-19 declaration under Section 564(b)(1) of the Act, 21 U.S.C. section 360bbb-3(b)(1), unless the authorization is terminated or revoked.     Resp Syncytial Virus by PCR NEGATIVE NEGATIVE Final    Comment: (NOTE) Fact Sheet for Patients: BloggerCourse.com  Fact Sheet for Healthcare Providers: SeriousBroker.it  This test is not yet approved or cleared by the [place]  FDA and has been authorized for detection and/or diagnosis of SARS-CoV-2 by FDA under an Emergency Use Authorization (EUA). This EUA will remain in effect (meaning this test can be used) for the duration of the COVID-19 declaration under Section 564(b)(1) of the Act, 21 U.S.C. section 360bbb-3(b)(1), unless the authorization is terminated or revoked.  Performed at  [facility], [address] [place], [place] [address]   Respiratory (~20 pathogens) panel by PCR     Status: None   Collection Time: 11/09/23  5:31 PM   Specimen: Nasopharyngeal Swab; Respiratory  Result Value Ref Range Status   Adenovirus NOT DETECTED NOT DETECTED Final   Coronavirus 229E NOT DETECTED NOT DETECTED Final    Comment: (NOTE) The Coronavirus on the Respiratory Panel, DOES NOT test for the novel  Coronavirus (2019 nCoV)    Coronavirus HKU1 NOT DETECTED NOT DETECTED Final   Coronavirus NL63 NOT DETECTED NOT DETECTED Final   Coronavirus OC43 NOT DETECTED NOT DETECTED Final   Metapneumovirus NOT DETECTED NOT DETECTED Final   Rhinovirus / Enterovirus NOT DETECTED NOT DETECTED Final   Influenza A NOT DETECTED NOT DETECTED Final   Influenza B NOT DETECTED NOT DETECTED Final   Parainfluenza Virus 1 NOT DETECTED NOT DETECTED Final   Parainfluenza Virus 2 NOT DETECTED NOT DETECTED Final   Parainfluenza Virus 3 NOT DETECTED NOT DETECTED Final   Parainfluenza Virus 4 NOT DETECTED NOT DETECTED Final   Respiratory Syncytial Virus NOT DETECTED NOT DETECTED Final   Bordetella pertussis NOT DETECTED NOT DETECTED Final   Bordetella Parapertussis NOT DETECTED NOT DETECTED Final   Chlamydophila pneumoniae NOT DETECTED NOT DETECTED Final   Mycoplasma pneumoniae NOT DETECTED NOT DETECTED Final    Comment: Performed at [facility], [address], [place], [place] [address]  Culture,  blood (Routine X 2) w Reflex to ID Panel     Status: None   Collection Time: 11/09/23  6:16 PM   Specimen: BLOOD  Result Value Ref Range Status   Specimen Description   Final    BLOOD RIGHT ANTECUBITAL Performed at [facility], [address] [place], [place] [address]    Special Requests   Final    BOTTLES DRAWN AEROBIC ONLY Blood Culture adequate volume Performed at [facility], [address] [place], [place] [address]    Culture   Final    NO GROWTH 11  DAYS Performed at [facility], [address] [place], [place] [address]    Report Status 11/20/2023 FINAL  Final  Culture, blood (Routine X 2) w Reflex to ID Panel     Status: None   Collection Time: 11/09/23  6:23 PM   Specimen: BLOOD  Result Value Ref Range Status   Specimen Description   Final    BLOOD BLOOD LEFT FOREARM Performed at [facility], [address] [place], [place] [address]    Special Requests   Final    BOTTLES DRAWN AEROBIC AND ANAEROBIC Blood Culture adequate volume Performed at [facility], [address], [place], [place] [address]    Culture   Final    NO GROWTH 11 DAYS Performed at [facility], [address] [place], [place] [address]    Report Status 11/20/2023 FINAL  Final  Urine Culture     Status: Abnormal   Collection Time: 11/10/23  1:58 AM   Specimen: Urine, Random  Result Value Ref Range Status   Specimen Description   Final    URINE, RANDOM Performed at [facility], [address] [place], [place] [address]    Special Requests   Final    NONE Reflexed from [id number] Performed at [facility], [address] [place], [place] [address]    Culture 60,000 COLONIES/mL ENTEROCOCCUS FAECALIS (A)  Final   Report Status 11/12/2023 FINAL  Final   Organism ID, Bacteria ENTEROCOCCUS FAECALIS (A)  Final      Susceptibility   Enterococcus faecalis - MIC*    AMPICILLIN  <=2 SENSITIVE Sensitive     NITROFURANTOIN <=16 SENSITIVE Sensitive     VANCOMYCIN 1 SENSITIVE Sensitive     * 60,000 COLONIES/mL ENTEROCOCCUS FAECALIS  Culture, blood (x 2)     Status: None (Preliminary result)   Collection Time: 11/25/23 10:31 PM   Specimen: BLOOD  Result Value Ref Range Status   Specimen Description BLOOD BLOOD LEFT ARM  Final   Special Requests   Final    BOTTLES DRAWN AEROBIC AND ANAEROBIC Blood Culture adequate volume   Culture   Final    NO GROWTH 2 DAYS Performed at [facility], [address] [place], [place] [address]    Report Status PENDING  Incomplete  Culture, blood (x 2)     Status: None (Preliminary result)   Collection Time: 11/25/23 10:31 PM   Specimen: BLOOD  Result Value Ref Range Status   Specimen Description BLOOD BLOOD RIGHT ARM  Final   Special Requests   Final    BOTTLES DRAWN AEROBIC AND ANAEROBIC Blood Culture adequate volume   Culture   Final    NO GROWTH 2 DAYS Performed at [facility], [address] [place], [place] [address]    Report Status PENDING  Incomplete    Coagulation Studies: Recent Labs  11/25/23 2231  LABPROT 15.5*  INR 1.2    Urinalysis: No results for input(s): COLORURINE, LABSPEC, PHURINE, GLUCOSEU, HGBUR, BILIRUBINUR, KETONESUR, PROTEINUR, UROBILINOGEN, NITRITE, LEUKOCYTESUR in the last 72 hours.  Invalid input(s): APPERANCEUR     Imaging: CT ABDOMEN PELVIS WO CONTRAST Result Date: 11/26/2023 CLINICAL DATA:  Dyspnea abdominal pain, fevers, tachypnea, distention EXAM: CT CHEST, ABDOMEN AND PELVIS WITHOUT CONTRAST TECHNIQUE: Multidetector CT imaging of the chest, abdomen and pelvis was performed following the standard protocol without IV contrast. RADIATION DOSE REDUCTION: This exam was performed according to the departmental dose-optimization program which includes automated exposure control, adjustment of the mA and/or kV according to patient size and/or use of iterative reconstruction technique. COMPARISON:  11/02/2023 FINDINGS: CT CHEST FINDINGS Cardiovascular: Large-bore right neck multi lumen vascular catheter. Normal heart size. Three-vessel coronary artery calcifications no pericardial effusion. Mediastinum/Nodes: No enlarged mediastinal, hilar, or axillary lymph nodes. Thyroid  gland, trachea, and esophagus demonstrate no significant findings. Lungs/Pleura: Small bilateral pleural effusions and associated atelectasis or consolidation, similar to prior examination.  Musculoskeletal: No chest wall abnormality. No acute osseous findings. CT ABDOMEN PELVIS FINDINGS Hepatobiliary: No solid liver abnormality is seen. Tiny gallstones. No gallbladder wall thickening, or biliary dilatation. Pancreas: Unremarkable. No pancreatic ductal dilatation or surrounding inflammatory changes. Spleen: Normal in size without significant abnormality. Adrenals/Urinary Tract: Adrenal glands are unremarkable. Moderate bilateral hydronephrosis and hydroureter to the ureterovesicular junctions without calculus or other visible obstruction. Interval increase in extensive bilateral simple attenuation perinephric fluid (series 2, image 80). Severe wall thickening of the decompressed urinary bladder. Stomach/Bowel: Percutaneous gastrostomy. Appendix not clearly visualized. No evidence of bowel wall thickening, distention, or inflammatory changes. Sigmoid diverticulosis. Vascular/Lymphatic: Aortic atherosclerosis. No enlarged abdominal or pelvic lymph nodes. Reproductive: No mass or other abnormality. Other: No abdominal wall hernia.  Anasarca.  No ascites. Musculoskeletal: No acute osseous findings. IMPRESSION: 1. Moderate bilateral hydronephrosis and hydroureter to the ureterovesicular junctions without calculus or other visible obstruction likely secondary to ureterovesicular junction due to bladder wall edema. 2. Interval increase in extensive bilateral simple attenuation perinephric fluid. 3. Pleural effusion similar to prior examination.  Anasarca. 4. Cholelithiasis. 5. Percutaneous gastrostomy. 6. Coronary artery disease. Aortic Atherosclerosis (ICD10-I70.0). Electronically Signed   By: Marolyn JONETTA Jaksch M.D.   On: 11/26/2023 19:16   CT CHEST WO CONTRAST Result Date: 11/26/2023 CLINICAL DATA:  Dyspnea abdominal pain, fevers, tachypnea, distention EXAM: CT CHEST, ABDOMEN AND PELVIS WITHOUT CONTRAST TECHNIQUE: Multidetector CT imaging of the chest, abdomen and pelvis was performed following the standard  protocol without IV contrast. RADIATION DOSE REDUCTION: This exam was performed according to the departmental dose-optimization program which includes automated exposure control, adjustment of the mA and/or kV according to patient size and/or use of iterative reconstruction technique. COMPARISON:  11/02/2023 FINDINGS: CT CHEST FINDINGS Cardiovascular: Large-bore right neck multi lumen vascular catheter. Normal heart size. Three-vessel coronary artery calcifications no pericardial effusion. Mediastinum/Nodes: No enlarged mediastinal, hilar, or axillary lymph nodes. Thyroid  gland, trachea, and esophagus demonstrate no significant findings. Lungs/Pleura: Small bilateral pleural effusions and associated atelectasis or consolidation, similar to prior examination. Musculoskeletal: No chest wall abnormality. No acute osseous findings. CT ABDOMEN PELVIS FINDINGS Hepatobiliary: No solid liver abnormality is seen. Tiny gallstones. No gallbladder wall thickening, or biliary dilatation. Pancreas: Unremarkable. No pancreatic ductal dilatation or surrounding inflammatory changes. Spleen: Normal in size without significant abnormality. Adrenals/Urinary Tract: Adrenal glands are unremarkable. Moderate bilateral hydronephrosis and hydroureter to the ureterovesicular junctions without calculus or other visible obstruction. Interval increase in extensive bilateral simple attenuation perinephric fluid (  series 2, image 80). Severe wall thickening of the decompressed urinary bladder. Stomach/Bowel: Percutaneous gastrostomy. Appendix not clearly visualized. No evidence of bowel wall thickening, distention, or inflammatory changes. Sigmoid diverticulosis. Vascular/Lymphatic: Aortic atherosclerosis. No enlarged abdominal or pelvic lymph nodes. Reproductive: No mass or other abnormality. Other: No abdominal wall hernia.  Anasarca.  No ascites. Musculoskeletal: No acute osseous findings. IMPRESSION: 1. Moderate bilateral hydronephrosis and  hydroureter to the ureterovesicular junctions without calculus or other visible obstruction likely secondary to ureterovesicular junction due to bladder wall edema. 2. Interval increase in extensive bilateral simple attenuation perinephric fluid. 3. Pleural effusion similar to prior examination.  Anasarca. 4. Cholelithiasis. 5. Percutaneous gastrostomy. 6. Coronary artery disease. Aortic Atherosclerosis (ICD10-I70.0). Electronically Signed   By: Marolyn JONETTA Jaksch M.D.   On: 11/26/2023 19:16   DG Chest 1 View Result Date: 11/25/2023 CLINICAL DATA:  Shortness of breath EXAM: PORTABLE CHEST 1 VIEW COMPARISON:  Film from earlier in the same day. FINDINGS: Dialysis catheter is again identified. Cardiac shadow is prominent but stable. Patient rotation accentuates the mediastinal markings. Central vascular congestion is noted with mild interstitial edema. Stable left basilar atelectasis is noted. IMPRESSION: Mild changes of CHF. Left basilar atelectasis. Electronically Signed   By: Oneil Devonshire M.D.   On: 11/25/2023 22:29        Medications:    albumin human     ceFEPime (MAXIPIME) IV 1 g (11/26/23 2354)   metronidazole  500 mg (11/27/23 1129)   vancomycin      sodium chloride    Intravenous Once   Chlorhexidine  Gluconate Cloth  6 each Topical Q0600   epoetin  alfa-epbx (RETACRIT ) injection  10,000 Units Intravenous Q M,W,F-HD   feeding supplement (NEPRO CARB STEADY)  120 mL Per Tube 5 X Daily   feeding supplement (NEPRO CARB STEADY)  237 mL Per Tube 5 X Daily   feeding supplement (PROSource TF20)  60 mL Per Tube Daily   ferrous sulfate  325 mg Per Tube Daily   free water   60 mL Per Tube 5 X Daily   gentamicin  ointment   Topical TID   heparin  injection (subcutaneous)  5,000 Units Subcutaneous Q8H   insulin  aspart  10 Units Subcutaneous Once   midodrine  5 mg Per Tube TID WC   multivitamin  1 tablet Per Tube QHS   pantoprazole  (PROTONIX ) IV  40 mg Intravenous Q12H   acetaminophen  **OR**  acetaminophen , albumin human, artificial tears, bisacodyl , ipratropium-albuterol , [DISCONTINUED] ondansetron  **OR** ondansetron  (ZOFRAN ) IV, mouth rinse  Assessment/ Plan:  Mr. Robert Vega is a 83 y.o.  male with obstructive sleep apnea, GERD, obesity, hypertension, dysphagia, severe esophageal dysmotility with achalasia status post PEG tube placement, recent aspiration pneumonia acute respiratory failure status post extubation 10/09/2023, who was admitted to [facility] on 10/05/2023 for Aspiration into respiratory tract, initial encounter [T17.908A] Severe sepsis (HCC) [A41.9, R65.20] History of dysphagia [Z87.898] Fever, unspecified fever cause [R50.9] Multifocal pneumonia [J18.9]   1.  Acute kidney injury.  Baseline creatinine 0.81 from 10/14/2023.  Acute kidney injury secondary to ATN, obstructive uropathy, and progression of prerenal azotemia.   Patient is critically ill.  He was started on urgent hemodialysis for severe hyperkalemia and uremia. - Vascular surgery placed PermCath on 6/26. [facility] dialysis coordinator notified of need for outpatient dialysis clinic.  Search in progress. - Patient received dialysis yesterday, experienced some episodes of bradycardia during treatment.  UF 1.2 L achieved.  Treatment terminated early due to hypotension.  Blood pressure recovered prior to exiting unit. -  Patient due for hemodialysis treatment today.  d.   2.  Severe hyperkalemia-serum potassium slightly high today at 5.2.  This should be corrected with dialysis treatment today.  3.  Sepsis due to aspiration pneumonia, found to have distal esophageal obstruction with severe dysmotility.  He is status post botulinum toxin injection to the lower esophageal sphincter.  PEG tube placed this admission. Receiving continuous tube feeds with scheduled free water  flushes.   4.Anemia in the setting of renal failure Lab Results  Component Value Date   HGB 7.4 (L) 11/27/2023  Hemoglobin decreased to 7.4.   Patient has received blood transfusions during this admission.  Continue Epogen  10,000 units IV with dialysis.     5.  Bilateral hydronephrosis Noted on CT.Urology team has evaluated the patient and it is thought to be secondary to reflux.  Due to health, further intervention deferred at this time.     LOS: 53 Alizzon Dioguardi 7/2/20254:50 PM

## 2023-11-27 NOTE — Consult Note (Signed)
 Infectious Disease     Reason for Consult: Leukocytosis   Referring Physician: Dr Lanetta Date of Admission:  10/05/2023   Principal Problem:   Severe sepsis (HCC) Active Problems:   Encounter for dialysis and dialysis catheter care [facility])   GERD (gastroesophageal reflux disease)   Dysphagia   Essential hypertension, benign   Multifocal pneumonia   Acute respiratory failure with hypoxia and hypercapnia (HCC)   History of dysphagia   Aspiration pneumonia (HCC)   Achalasia of esophagus   Acute delirium   Obesity (BMI 30-39.9)   Generalized weakness   Gastrostomy complication (HCC)   AKI (acute kidney injury) (HCC)   Generalized abdominal pain   Malnutrition of moderate degree   Melena   HPI: Robert Vega is a 83 y.o. male  with medical history significant of hypertension, sleep apnea, obesity, GERD presenting with acute febrile hypoxia, sepsis  and pneumonia.  Admitted 5/10 with increased work of breathing generalized weakness over the past 12 to 24 hours. On admit with PNA and possible aspiration.    5/10: Admit to [facility] service with sepsis due to Aspiration Pneumonia.  Course complicated by Acute Respiratory Failure due to Aspiration of vomitus w/ cardiac arrest transfer to ICU and intubation.  5/11: flexible bronchoscopy  5/13: PEG tube, +vomiting last night, failed SAT/SBT 5/14: extubated but with severe delirium 5/20:  Botulinum toxin injection into the lower esophageal sphincter by Dr. Jinny 5/21: esophagram showing diffusely distended esophagus with distal obstruction and severe dysmotility suggestive of achalasia. At that point patient was stabilized to point of pursuing SNF placement but then with complications of PEG tube placement resulted in return to OR 5/27 and tube feeds restarted 5/28 with zosyn  for peritonitis.  Had new melena 6/9 so underwent another EGD without acute bleeding seen.  Renal function declined and nephrology was consulted and without improvement with  conservative management, he received dialysis access and started on HD 6/16 as this was in line with family goals of care.   June 30 spiked fever 100.9. WBC has been elevated to 23. Started cefepime, flagyl  and vanco. Bcx neg CT chest abd pelvis mod bil hydro increase extensive bil perinephric fluid, stab;e small pleural effusions and atelectasis, consolidation and anasarca stable Past Medical History:  Diagnosis Date   Allergy    Arthritis    BPH (benign prostatic hypertrophy)    Colonic polyp    GERD (gastroesophageal reflux disease)    Has LPR   Hypertension    Past Surgical History:  Procedure Laterality Date   CATARACT EXTRACTION     2011, other in 2013   COLONOSCOPY  2011   CREATION, GASTROSTOMY, OPEN N/A 10/22/2023   Procedure: CREATION, GASTROSTOMY, OPEN; GASTROSTOMY CLOSURE;  Surgeon: Marinda Jayson KIDD, MD;  Location: [facility] ORS;  Service: General;  Laterality: N/A;   DIALYSIS/PERMA CATHETER INSERTION N/A 11/21/2023   Procedure: DIALYSIS/PERMA CATHETER INSERTION;  Surgeon: Marea Selinda RAMAN, MD;  Location: [facility] INVASIVE CV LAB;  Service: Cardiovascular;  Laterality: N/A;   ESOPHAGOGASTRODUODENOSCOPY N/A 10/15/2023   Procedure: EGD (ESOPHAGOGASTRODUODENOSCOPY);  Surgeon: Jinny Carmine, MD;  Location: [facility] ENDOSCOPY;  Service: Endoscopy;  Laterality: N/A;  WILL NEED BOTOX    ESOPHAGOGASTRODUODENOSCOPY N/A 11/05/2023   Procedure: EGD (ESOPHAGOGASTRODUODENOSCOPY);  Surgeon: Jinny Carmine, MD;  Location: [facility] ENDOSCOPY;  Service: Endoscopy;  Laterality: N/A;   ESOPHAGOGASTRODUODENOSCOPY (EGD) WITH PROPOFOL  N/A 08/30/2020   Procedure: ESOPHAGOGASTRODUODENOSCOPY (EGD) WITH PROPOFOL ;  Surgeon: Therisa Bi, MD;  Location: [facility] ENDOSCOPY;  Service: Gastroenterology;  Laterality: N/A;   ESOPHAGOGASTRODUODENOSCOPY (  EGD) WITH PROPOFOL  N/A 04/02/2022   Procedure: ESOPHAGOGASTRODUODENOSCOPY (EGD) WITH PROPOFOL ;  Surgeon: Therisa Bi, MD;  Location: [facility] ENDOSCOPY;  Service: Gastroenterology;  Laterality: N/A;    LAPAROTOMY N/A 10/22/2023   Procedure: LAPAROTOMY, EXPLORATORY;  Surgeon: Marinda Jayson KIDD, MD;  Location: [facility] ORS;  Service: General;  Laterality: N/A;   PEG PLACEMENT N/A 10/08/2023   Procedure: INSERTION, PEG TUBE;  Surgeon: Jinny Carmine, MD;  Location: [facility] ENDOSCOPY;  Service: Endoscopy;  Laterality: N/A;   skin cancer removal     TEMPORARY DIALYSIS CATHETER N/A 11/11/2023   Procedure: TEMPORARY DIALYSIS CATHETER;  Surgeon: Marea Selinda RAMAN, MD;  Location: [facility] INVASIVE CV LAB;  Service: Cardiovascular;  Laterality: N/A;   Social History   Tobacco Use   Smoking status: Former    Current packs/day: 0.00    Average packs/day: 2.0 packs/day for 50.0 years (100.0 ttl pk-yrs)    Types: Pipe, Cigarettes    Start date: 10/19/1964    Quit date: 10/20/2014    Years since quitting: 9.1    Passive exposure: Past   Smokeless tobacco: Never   Tobacco comments:    pipe   Vaping Use   Vaping status: Never Used  Substance Use Topics   Alcohol  use: No    Alcohol /week: 0.0 standard drinks of alcohol    Drug use: No   Family History  Problem Relation Age of Onset   Hypertension Mother    Heart disease Neg Hx    Diabetes Neg Hx    Cancer Neg Hx     Allergies:  Allergies  Allergen Reactions   Sulfonamide Derivatives     Current antibiotics: Antibiotics Given (last 72 hours)     Date/Time Action Medication Dose Rate   11/25/23 2313 New Bag/Given   vancomycin (VANCOCIN) IVPB 1000 mg/200 mL premix 1,000 mg 200 mL/hr   11/26/23 0022 New Bag/Given   ceFEPIme (MAXIPIME) 1 g in sodium chloride  0.9 % 100 mL IVPB 1 g 200 mL/hr   11/26/23 0104 New Bag/Given   metroNIDAZOLE  (FLAGYL ) IVPB 500 mg 500 mg 100 mL/hr   11/26/23 0220 New Bag/Given   vancomycin (VANCOREADY) IVPB 1250 mg/250 mL 1,250 mg 166.7 mL/hr   11/26/23 1025 New Bag/Given   metroNIDAZOLE  (FLAGYL ) IVPB 500 mg 500 mg 100 mL/hr   11/26/23 2354 New Bag/Given  [Loss of IV access]   ceFEPIme (MAXIPIME) 1 g in sodium chloride  0.9 %  100 mL IVPB 1 g 200 mL/hr   11/27/23 0030 New Bag/Given   metroNIDAZOLE  (FLAGYL ) IVPB 500 mg 500 mg 100 mL/hr   11/27/23 1129 New Bag/Given   metroNIDAZOLE  (FLAGYL ) IVPB 500 mg 500 mg 100 mL/hr       MEDICATIONS:  sodium chloride    Intravenous Once   Chlorhexidine  Gluconate Cloth  6 each Topical Q0600   epoetin  alfa-epbx (RETACRIT ) injection  10,000 Units Intravenous Q M,W,F-HD   feeding supplement (NEPRO CARB STEADY)  120 mL Per Tube 5 X Daily   feeding supplement (NEPRO CARB STEADY)  237 mL Per Tube 5 X Daily   feeding supplement (PROSource TF20)  60 mL Per Tube Daily   ferrous sulfate  325 mg Per Tube Daily   free water   60 mL Per Tube 5 X Daily   gentamicin  ointment   Topical TID   heparin  injection (subcutaneous)  5,000 Units Subcutaneous Q8H   insulin  aspart  10 Units Subcutaneous Once   midodrine  5 mg Per Tube TID WC   multivitamin  1 tablet Per Tube QHS  pantoprazole  (PROTONIX ) IV  40 mg Intravenous Q12H    Review of Systems - unable to obtain  OBJECTIVE: Temp:  [97.7 F (36.5 C)-99.2 F (37.3 C)] 98.1 F (36.7 C) (07/02 0921) Pulse Rate:  [74-88] 74 (07/02 0921) Resp:  [18-27] 23 (07/02 0921) BP: (102-137)/(55-72) 108/55 (07/02 0921) SpO2:  [92 %-97 %] 95 % (07/02 0921) Physical Exam  Constitutional: lethargic, chronically ill appearig HENT: anicteric Mouth/Throat: Oropharynx is dry Cardiovascular: Normal rate, regular rhythm and normal heart sounds.  Pulmonary/Chest: poor air movement, decreased BS bil bases  Abdominal: Soft. But distended, PEG tube in place, healed abd incision Neurological: lethargic Skin: multiple bruises Acces R chest wall HD cath in place    LABS: Results for orders placed or performed during the hospital encounter of 10/05/23 (from the past 48 hours)  Glucose, capillary     Status: Abnormal   Collection Time: 11/25/23  4:27 PM  Result Value Ref Range   Glucose-Capillary 139 (H) 70 - 99 mg/dL    Comment: Glucose reference range  applies only to samples taken after fasting for at least 8 hours.  Glucose, capillary     Status: Abnormal   Collection Time: 11/25/23  9:51 PM  Result Value Ref Range   Glucose-Capillary 123 (H) 70 - 99 mg/dL    Comment: Glucose reference range applies only to samples taken after fasting for at least 8 hours.  CBC     Status: Abnormal   Collection Time: 11/25/23  9:58 PM  Result Value Ref Range   WBC 20.8 (H) 4.0 - 10.5 K/uL   RBC 2.55 (L) 4.22 - 5.81 MIL/uL   Hemoglobin 7.5 (L) 13.0 - 17.0 g/dL   HCT 76.6 (L) 60.9 - 47.9 %   MCV 91.4 80.0 - 100.0 fL   MCH 29.4 26.0 - 34.0 pg   MCHC 32.2 30.0 - 36.0 g/dL   RDW 84.9 88.4 - 84.4 %   Platelets 332 150 - 400 K/uL   nRBC 0.0 0.0 - 0.2 %    Comment: Performed at [facility], [address] [place], [place] [address]  Brain natriuretic peptide     Status: Abnormal   Collection Time: 11/25/23  9:58 PM  Result Value Ref Range   B Natriuretic Peptide 120.4 (H) 0.0 - 100.0 pg/mL    Comment: Performed at [facility], [address] [place], [place] [address]  Comprehensive metabolic panel     Status: Abnormal   Collection Time: 11/25/23  9:58 PM  Result Value Ref Range   Sodium 132 (L) 135 - 145 mmol/L   Potassium 4.5 3.5 - 5.1 mmol/L   Chloride 94 (L) 98 - 111 mmol/L   CO2 26 22 - 32 mmol/L   Glucose, Bld 123 (H) 70 - 99 mg/dL    Comment: Glucose reference range applies only to samples taken after fasting for at least 8 hours.   BUN 63 (H) 8 - 23 mg/dL   Creatinine, Ser 5.04 (H) 0.61 - 1.24 mg/dL   Calcium  7.6 (L) 8.9 - 10.3 mg/dL   Total Protein 6.7 6.5 - 8.1 g/dL   Albumin 1.8 (L) 3.5 - 5.0 g/dL   AST 19 15 - 41 U/L   ALT 13 0 - 44 U/L   Alkaline Phosphatase 77 38 - 126 U/L   Total Bilirubin 0.2 0.0 - 1.2 mg/dL   GFR, Estimated 11 (L) >60 mL/min    Comment: (NOTE) Calculated using the CKD-EPI Creatinine Equation (2021)  Anion gap 12 5 - 15    Comment: Performed at [facility], [address] [place], [place] [address]  CBC with Differential/Platelet     Status: Abnormal   Collection Time: 11/25/23  9:58 PM  Result Value Ref Range   WBC 20.9 (H) 4.0 - 10.5 K/uL   RBC 2.57 (L) 4.22 - 5.81 MIL/uL   Hemoglobin 7.6 (L) 13.0 - 17.0 g/dL   HCT 76.4 (L) 60.9 - 47.9 %   MCV 91.4 80.0 - 100.0 fL   MCH 29.6 26.0 - 34.0 pg   MCHC 32.3 30.0 - 36.0 g/dL   RDW 84.7 88.4 - 84.4 %   Platelets 350 150 - 400 K/uL   nRBC 0.0 0.0 - 0.2 %   Neutrophils Relative % 82 %   Neutro Abs 17.0 (H) 1.7 - 7.7 K/uL   Lymphocytes Relative 4 %   Lymphs Abs 0.9 0.7 - 4.0 K/uL   Monocytes Relative 12 %   Monocytes Absolute 2.5 (H) 0.1 - 1.0 K/uL   Eosinophils Relative 0 %   Eosinophils Absolute 0.0 0.0 - 0.5 K/uL   Basophils Relative 0 %   Basophils Absolute 0.0 0.0 - 0.1 K/uL   Immature Granulocytes 2 %   Abs Immature Granulocytes 0.43 (H) 0.00 - 0.07 K/uL    Comment: Performed at [facility], [address] [place], [place] [address]  Culture, blood (x 2)     Status: None (Preliminary result)   Collection Time: 11/25/23 10:31 PM   Specimen: BLOOD  Result Value Ref Range   Specimen Description BLOOD BLOOD LEFT ARM    Special Requests      BOTTLES DRAWN AEROBIC AND ANAEROBIC Blood Culture adequate volume   Culture      NO GROWTH 2 DAYS Performed at [facility], [address], [place], [place] [address]    Report Status PENDING   Culture, blood (x 2)     Status: None (Preliminary result)   Collection Time: 11/25/23 10:31 PM   Specimen: BLOOD  Result Value Ref Range   Specimen Description BLOOD BLOOD RIGHT ARM    Special Requests      BOTTLES DRAWN AEROBIC AND ANAEROBIC Blood Culture adequate volume   Culture      NO GROWTH 2 DAYS Performed at [facility], [address], [place], [place] [address]    Report Status PENDING   Lactic acid, plasma     Status: None   Collection Time: 11/25/23 10:31 PM  Result Value Ref Range   Lactic Acid, Venous 0.7 0.5  - 1.9 mmol/L    Comment: Performed at [facility], [address] [place], [place] [address]  Protime-INR     Status: Abnormal   Collection Time: 11/25/23 10:31 PM  Result Value Ref Range   Prothrombin Time 15.5 (H) 11.4 - 15.2 seconds   INR 1.2 0.8 - 1.2    Comment: (NOTE) INR goal varies based on device and disease states. Performed at [facility], [address] [place], [place] [address]   APTT     Status: Abnormal   Collection Time: 11/25/23 10:31 PM  Result Value Ref Range   aPTT 41 (H) 24 - 36 seconds    Comment:        IF BASELINE aPTT IS ELEVATED, SUGGEST PATIENT RISK ASSESSMENT BE USED TO DETERMINE APPROPRIATE ANTICOAGULANT THERAPY. Performed at [facility], [address], [place], [place] [address]   Blood gas,  venous     Status: Abnormal   Collection Time: 11/25/23 10:42 PM  Result Value Ref Range   pH, Ven 7.39 7.25 - 7.43   pCO2, Ven 48 44 - 60 mmHg   pO2, Ven 72 (H) 32 - 45 mmHg   Bicarbonate 29.1 (H) 20.0 - 28.0 mmol/L   Acid-Base Excess 3.3 (H) 0.0 - 2.0 mmol/L   O2 Saturation 96.3 %   Patient temperature 37.0    Collection site VEIN     Comment: Performed at [facility], [address] [place], [place] [address]  Lactic acid, plasma     Status: None   Collection Time: 11/26/23 12:25 AM  Result Value Ref Range   Lactic Acid, Venous 0.9 0.5 - 1.9 mmol/L    Comment: Performed at [facility], [address] [place], [place] [address]  CBC     Status: Abnormal   Collection Time: 11/26/23  4:09 AM  Result Value Ref Range   WBC 21.0 (H) 4.0 - 10.5 K/uL   RBC 2.54 (L) 4.22 - 5.81 MIL/uL   Hemoglobin 7.4 (L) 13.0 - 17.0 g/dL   HCT 76.5 (L) 60.9 - 47.9 %   MCV 92.1 80.0 - 100.0 fL   MCH 29.1 26.0 - 34.0 pg   MCHC 31.6 30.0 - 36.0 g/dL   RDW 84.8 88.4 - 84.4 %   Platelets 331 150 - 400 K/uL   nRBC 0.0 0.0 - 0.2 %    Comment: Performed at [facility], [address] [place], [place]  [address]  Basic metabolic panel with GFR     Status: Abnormal   Collection Time: 11/26/23  4:09 AM  Result Value Ref Range   Sodium 134 (L) 135 - 145 mmol/L   Potassium 4.9 3.5 - 5.1 mmol/L   Chloride 96 (L) 98 - 111 mmol/L   CO2 24 22 - 32 mmol/L   Glucose, Bld 137 (H) 70 - 99 mg/dL    Comment: Glucose reference range applies only to samples taken after fasting for at least 8 hours.   BUN 72 (H) 8 - 23 mg/dL   Creatinine, Ser 4.35 (H) 0.61 - 1.24 mg/dL   Calcium  7.6 (L) 8.9 - 10.3 mg/dL   GFR, Estimated 9 (L) >60 mL/min    Comment: (NOTE) Calculated using the CKD-EPI Creatinine Equation (2021)    Anion gap 14 5 - 15    Comment: Performed at [facility], [address] [place], [place] [address]  Glucose, capillary     Status: Abnormal   Collection Time: 11/26/23  6:09 AM  Result Value Ref Range   Glucose-Capillary 138 (H) 70 - 99 mg/dL    Comment: Glucose reference range applies only to samples taken after fasting for at least 8 hours.  Glucose, capillary     Status: Abnormal   Collection Time: 11/26/23 11:21 AM  Result Value Ref Range   Glucose-Capillary 130 (H) 70 - 99 mg/dL    Comment: Glucose reference range applies only to samples taken after fasting for at least 8 hours.  Glucose, capillary     Status: Abnormal   Collection Time: 11/26/23  2:59 PM  Result Value Ref Range   Glucose-Capillary 123 (H) 70 - 99 mg/dL    Comment: Glucose reference range applies only to samples taken after fasting for at least 8 hours.  Glucose, capillary     Status: Abnormal   Collection Time: 11/26/23  4:25 PM  Result Value Ref  Range   Glucose-Capillary 115 (H) 70 - 99 mg/dL    Comment: Glucose reference range applies only to samples taken after fasting for at least 8 hours.  CBC     Status: Abnormal   Collection Time: 11/27/23  4:52 AM  Result Value Ref Range   WBC 23.1 (H) 4.0 - 10.5 K/uL   RBC 2.57 (L) 4.22 - 5.81 MIL/uL   Hemoglobin 7.4 (L) 13.0 - 17.0 g/dL   HCT 76.2 (L)  60.9 - 52.0 %   MCV 92.2 80.0 - 100.0 fL   MCH 28.8 26.0 - 34.0 pg   MCHC 31.2 30.0 - 36.0 g/dL   RDW 85.0 88.4 - 84.4 %   Platelets 381 150 - 400 K/uL   nRBC 0.0 0.0 - 0.2 %    Comment: Performed at [facility], [address] [place], [place] [address]  Basic metabolic panel with GFR     Status: Abnormal   Collection Time: 11/27/23  4:52 AM  Result Value Ref Range   Sodium 132 (L) 135 - 145 mmol/L   Potassium 5.2 (H) 3.5 - 5.1 mmol/L   Chloride 95 (L) 98 - 111 mmol/L   CO2 23 22 - 32 mmol/L   Glucose, Bld 97 70 - 99 mg/dL    Comment: Glucose reference range applies only to samples taken after fasting for at least 8 hours.   BUN 99 (H) 8 - 23 mg/dL   Creatinine, Ser 2.58 (H) 0.61 - 1.24 mg/dL   Calcium  7.8 (L) 8.9 - 10.3 mg/dL   GFR, Estimated 7 (L) >60 mL/min    Comment: (NOTE) Calculated using the CKD-EPI Creatinine Equation (2021)    Anion gap 14 5 - 15    Comment: Performed at [facility], [address] [place], [place] [address]  Magnesium     Status: Abnormal   Collection Time: 11/27/23  4:52 AM  Result Value Ref Range   Magnesium 2.7 (H) 1.7 - 2.4 mg/dL    Comment: Performed at [facility], [address], [place], [place] [address]  Phosphorus     Status: Abnormal   Collection Time: 11/27/23  4:52 AM  Result Value Ref Range   Phosphorus 7.3 (H) 2.5 - 4.6 mg/dL    Comment: Performed at [facility], [address] [place], [place] [address]  Glucose, capillary     Status: Abnormal   Collection Time: 11/27/23  9:22 AM  Result Value Ref Range   Glucose-Capillary 114 (H) 70 - 99 mg/dL    Comment: Glucose reference range applies only to samples taken after fasting for at least 8 hours.  Glucose, capillary     Status: Abnormal   Collection Time: 11/27/23 12:15 PM  Result Value Ref Range   Glucose-Capillary 138 (H) 70 - 99 mg/dL    Comment: Glucose reference range applies only to samples taken after fasting for at least 8  hours.   No components found for: ESR, C REACTIVE PROTEIN MICRO: Recent Results (from the past 720 hours)  Resp panel by RT-PCR (RSV, Flu A&B, Covid) Anterior Nasal Swab     Status: None   Collection Time: 11/09/23  5:31 PM   Specimen: Anterior Nasal Swab  Result Value Ref Range Status   SARS Coronavirus 2 by RT PCR NEGATIVE NEGATIVE Final    Comment: (NOTE) SARS-CoV-2 target nucleic acids are NOT DETECTED.  The SARS-CoV-2 RNA is generally detectable in upper respiratory specimens during the acute phase of infection.  The lowest concentration of SARS-CoV-2 viral copies this assay can detect is 138 copies/mL. A negative result does not preclude SARS-Cov-2 infection and should not be used as the sole basis for treatment or other patient management decisions. A negative result may occur with  improper specimen collection/handling, submission of specimen other than nasopharyngeal swab, presence of viral mutation(s) within the areas targeted by this assay, and inadequate number of viral copies(<138 copies/mL). A negative result must be combined with clinical observations, patient history, and epidemiological information. The expected result is Negative.  Fact Sheet for Patients:  BloggerCourse.com  Fact Sheet for Healthcare Providers:  SeriousBroker.it  This test is no t yet approved or cleared by the [place]  FDA and  has been authorized for detection and/or diagnosis of SARS-CoV-2 by FDA under an Emergency Use Authorization (EUA). This EUA will remain  in effect (meaning this test can be used) for the duration of the COVID-19 declaration under Section 564(b)(1) of the Act, 21 U.S.C.section 360bbb-3(b)(1), unless the authorization is terminated  or revoked sooner.       Influenza A by PCR NEGATIVE NEGATIVE Final   Influenza B by PCR NEGATIVE NEGATIVE Final    Comment: (NOTE) The Xpert Xpress SARS-CoV-2/FLU/RSV plus  assay is intended as an aid in the diagnosis of influenza from Nasopharyngeal swab specimens and should not be used as a sole basis for treatment. Nasal washings and aspirates are unacceptable for Xpert Xpress SARS-CoV-2/FLU/RSV testing.  Fact Sheet for Patients: BloggerCourse.com  Fact Sheet for Healthcare Providers: SeriousBroker.it  This test is not yet approved or cleared by the [place]  FDA and has been authorized for detection and/or diagnosis of SARS-CoV-2 by FDA under an Emergency Use Authorization (EUA). This EUA will remain in effect (meaning this test can be used) for the duration of the COVID-19 declaration under Section 564(b)(1) of the Act, 21 U.S.C. section 360bbb-3(b)(1), unless the authorization is terminated or revoked.     Resp Syncytial Virus by PCR NEGATIVE NEGATIVE Final    Comment: (NOTE) Fact Sheet for Patients: BloggerCourse.com  Fact Sheet for Healthcare Providers: SeriousBroker.it  This test is not yet approved or cleared by the [place]  FDA and has been authorized for detection and/or diagnosis of SARS-CoV-2 by FDA under an Emergency Use Authorization (EUA). This EUA will remain in effect (meaning this test can be used) for the duration of the COVID-19 declaration under Section 564(b)(1) of the Act, 21 U.S.C. section 360bbb-3(b)(1), unless the authorization is terminated or revoked.  Performed at [facility], [address] [place], [place] [address]   Respiratory (~20 pathogens) panel by PCR     Status: None   Collection Time: 11/09/23  5:31 PM   Specimen: Nasopharyngeal Swab; Respiratory  Result Value Ref Range Status   Adenovirus NOT DETECTED NOT DETECTED Final   Coronavirus 229E NOT DETECTED NOT DETECTED Final    Comment: (NOTE) The Coronavirus on the Respiratory Panel, DOES NOT test for the novel  Coronavirus (2019  nCoV)    Coronavirus HKU1 NOT DETECTED NOT DETECTED Final   Coronavirus NL63 NOT DETECTED NOT DETECTED Final   Coronavirus OC43 NOT DETECTED NOT DETECTED Final   Metapneumovirus NOT DETECTED NOT DETECTED Final   Rhinovirus / Enterovirus NOT DETECTED NOT DETECTED Final   Influenza A NOT DETECTED NOT DETECTED Final   Influenza B NOT DETECTED NOT DETECTED Final   Parainfluenza Virus 1 NOT DETECTED NOT DETECTED Final   Parainfluenza Virus 2 NOT DETECTED NOT DETECTED Final   Parainfluenza  Virus 3 NOT DETECTED NOT DETECTED Final   Parainfluenza Virus 4 NOT DETECTED NOT DETECTED Final   Respiratory Syncytial Virus NOT DETECTED NOT DETECTED Final   Bordetella pertussis NOT DETECTED NOT DETECTED Final   Bordetella Parapertussis NOT DETECTED NOT DETECTED Final   Chlamydophila pneumoniae NOT DETECTED NOT DETECTED Final   Mycoplasma pneumoniae NOT DETECTED NOT DETECTED Final    Comment: Performed at [facility], [address] [place], [place] [address]  Culture, blood (Routine X 2) w Reflex to ID Panel     Status: None   Collection Time: 11/09/23  6:16 PM   Specimen: BLOOD  Result Value Ref Range Status   Specimen Description   Final    BLOOD RIGHT ANTECUBITAL Performed at [facility], [address], [place], [place] [address]    Special Requests   Final    BOTTLES DRAWN AEROBIC ONLY Blood Culture adequate volume Performed at [facility], [address], [place], [place] [address]    Culture   Final    NO GROWTH 11 DAYS Performed at [facility], [address] [place], [place] [address]    Report Status 11/20/2023 FINAL  Final  Culture, blood (Routine X 2) w Reflex to ID Panel     Status: None   Collection Time: 11/09/23  6:23 PM   Specimen: BLOOD  Result Value Ref Range Status   Specimen Description   Final    BLOOD BLOOD LEFT FOREARM Performed at [facility], [address], [place], [place] [address]    Special Requests   Final     BOTTLES DRAWN AEROBIC AND ANAEROBIC Blood Culture adequate volume Performed at [facility], [address] [place], [place] [address]    Culture   Final    NO GROWTH 11 DAYS Performed at [facility], [address], [place], [place] [address]    Report Status 11/20/2023 FINAL  Final  Urine Culture     Status: Abnormal   Collection Time: 11/10/23  1:58 AM   Specimen: Urine, Random  Result Value Ref Range Status   Specimen Description   Final    URINE, RANDOM Performed at [facility], [address] [place], [place] [address]    Special Requests   Final    NONE Reflexed from [id number] Performed at [facility], [address] [place], [place] [address]    Culture 60,000 COLONIES/mL ENTEROCOCCUS FAECALIS (A)  Final   Report Status 11/12/2023 FINAL  Final   Organism ID, Bacteria ENTEROCOCCUS FAECALIS (A)  Final      Susceptibility   Enterococcus faecalis - MIC*    AMPICILLIN  <=2 SENSITIVE Sensitive     NITROFURANTOIN <=16 SENSITIVE Sensitive     VANCOMYCIN 1 SENSITIVE Sensitive     * 60,000 COLONIES/mL ENTEROCOCCUS FAECALIS  Culture, blood (x 2)     Status: None (Preliminary result)   Collection Time: 11/25/23 10:31 PM   Specimen: BLOOD  Result Value Ref Range Status   Specimen Description BLOOD BLOOD LEFT ARM  Final   Special Requests   Final    BOTTLES DRAWN AEROBIC AND ANAEROBIC Blood Culture adequate volume   Culture   Final    NO GROWTH 2 DAYS Performed at [facility], [address] [place], [place] [address]    Report Status PENDING  Incomplete  Culture, blood (x 2)     Status: None (Preliminary result)   Collection Time: 11/25/23 10:31  PM   Specimen: BLOOD  Result Value Ref Range Status   Specimen Description BLOOD BLOOD RIGHT ARM  Final   Special Requests   Final    BOTTLES DRAWN AEROBIC AND ANAEROBIC Blood Culture adequate volume   Culture   Final    NO GROWTH 2 DAYS Performed at [facility], [address], [place], [place] [address]    Report Status PENDING  Incomplete    IMAGING: CT ABDOMEN PELVIS WO CONTRAST Result Date: 11/26/2023 CLINICAL DATA:  Dyspnea abdominal pain, fevers, tachypnea, distention EXAM: CT CHEST, ABDOMEN AND PELVIS WITHOUT CONTRAST TECHNIQUE: Multidetector CT imaging of the chest, abdomen and pelvis was performed following the standard protocol without IV contrast. RADIATION DOSE REDUCTION: This exam was performed according to the departmental dose-optimization program which includes automated exposure control, adjustment of the mA and/or kV according to patient size and/or use of iterative reconstruction technique. COMPARISON:  11/02/2023 FINDINGS: CT CHEST FINDINGS Cardiovascular: Large-bore right neck multi lumen vascular catheter. Normal heart size. Three-vessel coronary artery calcifications no pericardial effusion. Mediastinum/Nodes: No enlarged mediastinal, hilar, or axillary lymph nodes. Thyroid  gland, trachea, and esophagus demonstrate no significant findings. Lungs/Pleura: Small bilateral pleural effusions and associated atelectasis or consolidation, similar to prior examination. Musculoskeletal: No chest wall abnormality. No acute osseous findings. CT ABDOMEN PELVIS FINDINGS Hepatobiliary: No solid liver abnormality is seen. Tiny gallstones. No gallbladder wall thickening, or biliary dilatation. Pancreas: Unremarkable. No pancreatic ductal dilatation or surrounding inflammatory changes. Spleen: Normal in size without significant abnormality. Adrenals/Urinary Tract: Adrenal glands are unremarkable. Moderate bilateral hydronephrosis and hydroureter to the ureterovesicular junctions without calculus or other visible obstruction. Interval increase in extensive bilateral simple attenuation perinephric fluid (series 2, image 80). Severe wall thickening of the decompressed urinary bladder. Stomach/Bowel: Percutaneous gastrostomy. Appendix not clearly visualized. No evidence  of bowel wall thickening, distention, or inflammatory changes. Sigmoid diverticulosis. Vascular/Lymphatic: Aortic atherosclerosis. No enlarged abdominal or pelvic lymph nodes. Reproductive: No mass or other abnormality. Other: No abdominal wall hernia.  Anasarca.  No ascites. Musculoskeletal: No acute osseous findings. IMPRESSION: 1. Moderate bilateral hydronephrosis and hydroureter to the ureterovesicular junctions without calculus or other visible obstruction likely secondary to ureterovesicular junction due to bladder wall edema. 2. Interval increase in extensive bilateral simple attenuation perinephric fluid. 3. Pleural effusion similar to prior examination.  Anasarca. 4. Cholelithiasis. 5. Percutaneous gastrostomy. 6. Coronary artery disease. Aortic Atherosclerosis (ICD10-I70.0). Electronically Signed   By: Marolyn JONETTA Jaksch M.D.   On: 11/26/2023 19:16   CT CHEST WO CONTRAST Result Date: 11/26/2023 CLINICAL DATA:  Dyspnea abdominal pain, fevers, tachypnea, distention EXAM: CT CHEST, ABDOMEN AND PELVIS WITHOUT CONTRAST TECHNIQUE: Multidetector CT imaging of the chest, abdomen and pelvis was performed following the standard protocol without IV contrast. RADIATION DOSE REDUCTION: This exam was performed according to the departmental dose-optimization program which includes automated exposure control, adjustment of the mA and/or kV according to patient size and/or use of iterative reconstruction technique. COMPARISON:  11/02/2023 FINDINGS: CT CHEST FINDINGS Cardiovascular: Large-bore right neck multi lumen vascular catheter. Normal heart size. Three-vessel coronary artery calcifications no pericardial effusion. Mediastinum/Nodes: No enlarged mediastinal, hilar, or axillary lymph nodes. Thyroid  gland, trachea, and esophagus demonstrate no significant findings. Lungs/Pleura: Small bilateral pleural effusions and associated atelectasis or consolidation, similar to prior examination. Musculoskeletal: No chest wall  abnormality. No acute osseous findings. CT ABDOMEN PELVIS FINDINGS Hepatobiliary: No solid liver abnormality is seen. Tiny gallstones. No gallbladder wall thickening, or biliary dilatation. Pancreas: Unremarkable. No pancreatic ductal dilatation or surrounding inflammatory changes.  Spleen: Normal in size without significant abnormality. Adrenals/Urinary Tract: Adrenal glands are unremarkable. Moderate bilateral hydronephrosis and hydroureter to the ureterovesicular junctions without calculus or other visible obstruction. Interval increase in extensive bilateral simple attenuation perinephric fluid (series 2, image 80). Severe wall thickening of the decompressed urinary bladder. Stomach/Bowel: Percutaneous gastrostomy. Appendix not clearly visualized. No evidence of bowel wall thickening, distention, or inflammatory changes. Sigmoid diverticulosis. Vascular/Lymphatic: Aortic atherosclerosis. No enlarged abdominal or pelvic lymph nodes. Reproductive: No mass or other abnormality. Other: No abdominal wall hernia.  Anasarca.  No ascites. Musculoskeletal: No acute osseous findings. IMPRESSION: 1. Moderate bilateral hydronephrosis and hydroureter to the ureterovesicular junctions without calculus or other visible obstruction likely secondary to ureterovesicular junction due to bladder wall edema. 2. Interval increase in extensive bilateral simple attenuation perinephric fluid. 3. Pleural effusion similar to prior examination.  Anasarca. 4. Cholelithiasis. 5. Percutaneous gastrostomy. 6. Coronary artery disease. Aortic Atherosclerosis (ICD10-I70.0). Electronically Signed   By: Marolyn JONETTA Jaksch M.D.   On: 11/26/2023 19:16   DG Chest 1 View Result Date: 11/25/2023 CLINICAL DATA:  Shortness of breath EXAM: PORTABLE CHEST 1 VIEW COMPARISON:  Film from earlier in the same day. FINDINGS: Dialysis catheter is again identified. Cardiac shadow is prominent but stable. Patient rotation accentuates the mediastinal markings. Central  vascular congestion is noted with mild interstitial edema. Stable left basilar atelectasis is noted. IMPRESSION: Mild changes of CHF. Left basilar atelectasis. Electronically Signed   By: Oneil Devonshire M.D.   On: 11/25/2023 22:29   DG Chest Port 1 View Result Date: 11/25/2023 CLINICAL DATA:  Dyspnea.  Weakness EXAM: PORTABLE CHEST 1 VIEW COMPARISON:  11/11/2023 FINDINGS: Interval placement of a dual lumen dialysis catheter. Tips in the distal SVC. Stable cardiac silhouette. There is new a peripheral interstitial linear markings consistent interstitial edema. LEFT basilar atelectasis and small effusion. No pneumothorax. IMPRESSION: 1. Interval placement of dialysis catheter. 2. Interstitial edema. 3. LEFT basilar atelectasis and small effusion. Electronically Signed   By: Jackquline Boxer M.D.   On: 11/25/2023 18:57   PERIPHERAL VASCULAR CATHETERIZATION Result Date: 11/21/2023 See surgical note for result.  DG Abd 1 View Result Date: 11/16/2023 CLINICAL DATA:  Abdominal distension. EXAM: ABDOMEN - 1 VIEW portable supine COMPARISON:  X-ray 11/09/2023. FINDINGS: Right femoral catheter in place. Tip seen to the right of the spine at L5. Films are under penetration and there is motion. In addition the top of the diaphragm is clipped off the edge of the film as is the right lateral hemi abdominal edge. Gas is seen in nondilated loops of colon. Minimal small bowel gas. Overlapping presumed G-tube. Presumed vascular calcifications in the pelvis. Degenerative changes of the spine and pelvis. IMPRESSION: Limited x-rays. Nonspecific bowel gas pattern. Minimal small bowel gas. G-tube.  Right femoral double-lumen large-bore catheter. Electronically Signed   By: Ranell Bring M.D.   On: 11/16/2023 11:15   DG Chest Port 1 View Result Date: 11/11/2023 CLINICAL DATA:  Tachypnea EXAM: PORTABLE CHEST 1 VIEW COMPARISON:  11/08/2023 FINDINGS: Single frontal view of the chest demonstrates a stable cardiac silhouette. Lung  volumes are diminished, with crowding of the pulmonary vasculature. No airspace disease, effusion, or pneumothorax. No acute bony abnormalities. IMPRESSION: 1. Low lung volumes, no acute airspace disease. Electronically Signed   By: Ozell Daring M.D.   On: 11/11/2023 17:54   PERIPHERAL VASCULAR CATHETERIZATION Result Date: 11/11/2023 See surgical note for result.  CT ABDOMEN PELVIS WO CONTRAST Result Date: 11/10/2023 CLINICAL DATA:  Gastrostomy, abdominal distension. EXAM: CT ABDOMEN  AND PELVIS WITHOUT CONTRAST TECHNIQUE: Multidetector CT imaging of the abdomen and pelvis was performed following the standard protocol without IV contrast. RADIATION DOSE REDUCTION: This exam was performed according to the departmental dose-optimization program which includes automated exposure control, adjustment of the mA and/or kV according to patient size and/or use of iterative reconstruction technique. COMPARISON:  11/02/2023. FINDINGS: Lower chest: Right-sided small pleural effusion and right base consolidation or volume loss. Minimal subsegmental atelectasis left base. Atheromatous calcifications of coronary arteries. No pericardial effusion. Decompressed moderate-sized paraesophageal hiatal hernia. Hepatobiliary: No hepatic parenchymal abnormalities. Small amount of milk of calcium  or tiny calcified stones in the gallbladder. Gallbladder is contracted. Pancreas: Unremarkable. No pancreatic ductal dilatation or surrounding inflammatory changes. Spleen: Normal in size without focal abnormality. Adrenals/Urinary Tract: No adrenal lesions. Bilateral hydronephrosis and hydroureter without evidence of stones. Urinary bladder is empty. Stomach/Bowel: Gastrostomy in the stomach. No bowel dilatation to suggest obstruction. Normal appendix. Vascular/Lymphatic: Aortic atherosclerosis. No enlarged abdominal or pelvic lymph nodes. Reproductive: Prostate is unremarkable. Other: No abdominal wall defects. There is fluid in the  pararenal space as well as in the paracolic gutters. Edema or inflammatory changes represent an interval change from the prior study extending along the pericolic gutters bilaterally as well as in the lower abdominal retroperitoneum. Musculoskeletal: No acute or significant osseous findings. IMPRESSION: 1. Right-sided pleural effusion and right base consolidation or volume loss. 2. Bilateral hydronephrosis and hydroureter without evidence of stones. 3. Fluid in the pararenal space and paracolic gutters. Inflammatory edematous changes extend into the pelvis in the presacral region. 4. Moderate-sized paraesophageal hiatal hernia. Gastrostomy appropriately positioned in the stomach. 5. Aortic atherosclerosis (ICD10-I70.0). Electronically Signed   By: Fonda Field M.D.   On: 11/10/2023 10:14   DG Abd 1 View Result Date: 11/10/2023 EXAM: 1 VIEW XRAY OF THE ABDOMEN SUPINE 11/10/2023 12:08:49 AM COMPARISON: CT abdomen/pelvis dated 12/02/2023. CLINICAL HISTORY: Abdominal distension, constipation. FINDINGS: BOWEL: Nonobstructive bowel gas pattern. Residual contrast in the distal colon with suspected sigmoid diverticulosis. PERITONEUM AND SOFT TISSUES: Suspected gastrostomy overlying the stomach. BONES: Mild degenerative changes of the right hip. No acute osseous abnormality. IMPRESSION: 1. No acute findings. 2. Suspected gastrostomy overlying the stomach. Electronically signed by: Pinkie Pebbles MD 11/10/2023 12:15 AM EDT RP Workstation: [id number]   DG Chest Port 1 View Result Date: 11/08/2023 CLINICAL DATA:  Congestive heart failure EXAM: PORTABLE CHEST 1 VIEW COMPARISON:  Chest radiograph dated 10/29/2023 FINDINGS: Patient is rotated to the right. Low lung volumes with bronchovascular crowding. Increased diffuse interstitial opacities. Similar trace bilateral pleural effusions. No pneumothorax. Similar enlarged cardiomediastinal silhouette. No acute osseous abnormality. IMPRESSION: 1. Increased diffuse  interstitial opacities, likely pulmonary edema. 2. Similar trace bilateral pleural effusions. Electronically Signed   By: Limin  Xu M.D.   On: 11/08/2023 13:18   CT CHEST ABDOMEN PELVIS WO CONTRAST Result Date: 11/02/2023 CLINICAL DATA:  Leukocytosis, recent dislodged feeding tube, elevated temperature EXAM: CT CHEST, ABDOMEN AND PELVIS WITHOUT CONTRAST TECHNIQUE: Multidetector CT imaging of the chest, abdomen and pelvis was performed following the standard protocol without IV contrast. RADIATION DOSE REDUCTION: This exam was performed according to the departmental dose-optimization program which includes automated exposure control, adjustment of the mA and/or kV according to patient size and/or use of iterative reconstruction technique. COMPARISON:  10/22/2023 FINDINGS: CT CHEST FINDINGS Cardiovascular: Heart is normal size. Aorta is normal caliber. Three-vessel coronary artery disease. Mediastinum/Nodes: Esophagus is dilated and fluid-filled. This is similar to prior study. No mediastinal, hilar, or axillary adenopathy. Trachea and thyroid  unremarkable.  Lungs/Pleura: Trace bilateral pleural effusions. Bronchial wall thickening noted in the lower lobes, right greater than left with airspace opacities in the lower lobes, right greater than left. This could reflect atelectasis or early pneumonia. Musculoskeletal: Chest wall soft tissues are unremarkable. No acute bony abnormality. CT ABDOMEN PELVIS FINDINGS Hepatobiliary: Small gallstone noted in the gallbladder. No biliary ductal dilatation. No focal hepatic abnormality. Pancreas: No focal abnormality or ductal dilatation. Spleen: No focal abnormality.  Normal size. Adrenals/Urinary Tract: Mild bilateral hydronephrosis and hydroureter, similar to prior study. Urinary bladder unremarkable. No renal or adrenal suspicious lesion. Stomach/Bowel: Gastrostomy in place within the distal stomach, unremarkable. Stomach, large and small bowel grossly unremarkable.  Vascular/Lymphatic: Aortic atherosclerosis. No evidence of aneurysm or adenopathy. Reproductive: No visible focal abnormality. Other: No free fluid or free air. Musculoskeletal: No acute bony abnormality. IMPRESSION: Dilated, fluid-filled esophagus may be related to dysmotility or reflux. Airway thickening in the lower lobes with airspace opacities in the lower lobes, right greater than left. This could reflect atelectasis or pneumonia. Three vessel coronary artery disease, aortic atherosclerosis. Cholelithiasis.  No CT evidence of acute cholecystitis. Gastrostomy tube in the stomach, grossly unremarkable. Mild bilateral hydronephrosis without visible obstructing stones. Small amount of free fluid in the cul-de-sac. Electronically Signed   By: Franky Crease M.D.   On: 11/02/2023 20:39   US  RENAL Result Date: 11/02/2023 CLINICAL DATA:  Acute renal insufficiency. EXAM: RENAL / URINARY TRACT ULTRASOUND COMPLETE COMPARISON:  CT abdomen pelvis dated 10/22/2023. FINDINGS: Right Kidney: Renal measurements: 12.8 x 6.0 x 5.3 cm = volume: 213 mL. Normal echogenicity. There is mild right hydronephrosis. No shadowing stone. Left Kidney: Renal measurements: 12.8 x 7.0 x 4.9 cm = volume: 233 mL. Normal echogenicity. Mild left hydronephrosis. No shadowing stone. Bladder: Appears normal for degree of bladder distention. Other: None. IMPRESSION: Mild bilateral hydronephrosis.  No shadowing stone. Electronically Signed   By: Vanetta Chou M.D.   On: 11/02/2023 10:12   DG Chest Port 1 View Result Date: 10/29/2023 CLINICAL DATA:  Hypoxia EXAM: PORTABLE CHEST 1 VIEW COMPARISON:  Chest x-ray 10/28/2023 FINDINGS: Cardiac silhouette is enlarged, unchanged. The aorta is tortuous. There is central pulmonary vascular congestion. There is no focal lung consolidation, pleural effusion or pneumothorax. No acute fractures are seen. IMPRESSION: Cardiomegaly with central pulmonary vascular congestion. Electronically Signed   By: Greig Pique  M.D.   On: 10/29/2023 21:05    Assessment:   Robert Vega is a 83 y.o. male with chronic multiple medical problems admitted since May 10 and had a complicated hospital course as above.  Initially she was aspiration pneumonia due to severe esophageal dysmotility now status post botulinum toxin injection to the lower sphincter and PEG tube placement.  We are consulted for recurrent fever and persistent leukocytosis.  He has had blood cultures done and a pan scan chest abdomen pelvis with findings of moderate bilateral hydronephrosis and hydroureter and increase in extensive bilateral simple attenuation perinephric fluid.  Severe wall thickening of the decompressed bladder.. Started on cefepime Flagyl  and Vanco June 30. He does have a hemodialysis catheter to right chest wall but this appears without evidence of infection.  He has no urine output per renal note despite increased free water  intake.  Leukocytosis has been chronically elevated likely multifactorial.  Unclear source of his recurrent fever but could be current aspiration despite the PEG tube.  No evidence of intra-abdominal abscess.  Recommendations If he makes urine I would check a urine sample. Continue broad-spectrum antibiotics with  cefepime Vanco metronidazole  pending culture results. Monitor for new or recurrent fevers. Thank you very much for allowing me to participate in the care of this patient. Please call with questions.   Alm SQUIBB. Epifanio, MD

## 2023-11-27 NOTE — Progress Notes (Signed)
 Physical Therapy Treatment Patient Details Name: Robert Vega MRN: [id number] DOB: 02/06/1941 Today's Date: 11/27/2023   History of Present Illness 83 y.o male with significant PMH of OSA, GERD, Obesity, HTN, Dysphagia: EGD 03/2022 with food in upper esophagus complicated by aspiration event, cardiac arrest with round of CPR, and post resuscitation EGD with concern for lack of peristalsis. Pt presented to ED on 10/05/2023 with hypoxia, fever and generalized weakness; developed acute respiratory failure requiring intubation 5/10 due to aspiration pneumonia, extubated 5/15, but had significant agitation required brief course of Precedex ; pt now s/p exploratory laparotomy with closure of gastrotomy and insertion of gastrostomy tube on 10/22/23.  PT order discontinued by MD 11/10/23 and new PT consult received 11/14/23.  Pt s/p R temporary femoral vein dialysis catheter placement 11/11/23; now removed; pt now with HD perm cath.    PT Comments  Pt was asleep long sitting in bed upon arrival. He is difficult to arouse and remains very lethargic throughout session. Pt only opens eyes for a total of < one minute throughout. Did require sternal rub several times to get pt to fully participate without falling back to sleep. Pt sao2 80% on rm air upon arrival. He had removed o2 independently but was encouraged to leave on throughout session. Reapplied 2 L o2 with sao2 recovering to > 90%. Pt was total assisted to EOB in hopes to improve alertness however pt remains lethargic. He required extensive +2 assistance to safely achieve EOB sitting. At first, pt continues to have severe posterior lean/rigidity however with time does improve. Eventually able to maintain sitting balance with min-CGA. Pt unable to perform any AROM when max encouragement. BLE PROM in sitting in addition to cervical spine ROM to prevent contracture. Pt tends to have head flexed and slightly turned R. Acute PT will continue to follow and progress as  able per current POC. Pt remains far from baseline and will benefit from the continued skilled PT to maximize independence and safety with all ADLs.      If plan is discharge home, recommend the following: Two people to help with walking and/or transfers;Two people to help with bathing/dressing/bathroom;Assistance with cooking/housework;Direct supervision/assist for medications management;Direct supervision/assist for financial management;Assist for transportation;Help with stairs or ramp for entrance;Supervision due to cognitive status     Equipment Recommendations  Other (comment) (Defer to next level of care)       Precautions / Restrictions Precautions Precautions: Fall (PEG) Recall of Precautions/Restrictions: Impaired Precaution/Restrictions Comments: PEG; perm cath (HD) Restrictions Weight Bearing Restrictions Per Provider Order: No     Mobility  Bed Mobility Overal bed mobility: Needs Assistance Bed Mobility: Supine to Sit, Sit to Supine  Supine to sit: Total assist, +2 for physical assistance, +2 for safety/equipment Sit to supine: Total assist, +2 for physical assistance, +2 for safety/equipment General bed mobility comments: Pt was total assisted to EOB in hopes to promote increased alertness. pt remains extremely lethargic. mumbling speech and poor ability to follow simple commands. severe ridgidity and initail posterior push that imporved with max assist + vcs. eventually CGA-min but pt unable to maintain without assistance.    Transfers  General transfer comment: unable to attempt due to poor level of arousal and inability to stay awake.      Balance Overall balance assessment: Needs assistance Sitting-balance support: Feet supported, Bilateral upper extremity supported Sitting balance-Leahy Scale: Poor  Standing balance comment: NT due to poor level of alertness and ability to perform desired commands  Communication Communication Communication: Impaired Factors  Affecting Communication: Reduced clarity of speech  Cognition Arousal: Alert Behavior During Therapy: WFL for tasks assessed/performed   PT - Cognitive impairments: Orientation, Awareness, Memory, Attention, Initiation, Sequencing, Problem solving, Safety/Judgement   Orientation impairments: Place, Time, Situation    PT - Cognition Comments: Pt was extremely lethargic throughout session. barely open eyes even with total assisted to EOB. poor overall participation due to lethargy. Following commands: Impaired Following commands impaired: Follows one step commands inconsistently    Cueing Cueing Techniques: Verbal cues, Tactile cues     General Comments General comments (skin integrity, edema, etc.): BLE PROM while EOB with max encouragement to participate in desired exercises. pt unable todfay. AAROM to cervical spine to prevent contractures. Pt tends to have neck flexed and turned towards R however author was able to bring head to neutral      Pertinent Vitals/Pain Pain Assessment Pain Assessment: PAINAD Breathing: occasional labored breathing, short period of hyperventilation Negative Vocalization: repeated troubled calling out, loud moaning/groaning, crying Facial Expression: facial grimacing Body Language: tense, distressed pacing, fidgeting Consolability: distracted or reassured by voice/touch PAINAD Score: 7 Pain Intervention(s): Limited activity within patient's tolerance, Monitored during session, Premedicated before session, Repositioned     PT Goals (current goals can now be found in the care plan section) Acute Rehab PT Goals Patient Stated Goal: none stated Progress towards PT goals: Progressing toward goals    Frequency    Min 2X/week       Co-evaluation     PT goals addressed during session: Mobility/safety with mobility;Balance;Proper use of DME;Strengthening/ROM        AM-PAC PT 6 Clicks Mobility   Outcome Measure  Help needed turning from your  back to your side while in a flat bed without using bedrails?: Total Help needed moving from lying on your back to sitting on the side of a flat bed without using bedrails?: Total Help needed moving to and from a bed to a chair (including a wheelchair)?: Total Help needed standing up from a chair using your arms (e.g., wheelchair or bedside chair)?: Total Help needed to walk in hospital room?: Total Help needed climbing 3-5 steps with a railing? : Total 6 Click Score: 6    End of Session Equipment Utilized During Treatment: Oxygen (pt had removed 2L o2 with his sao2 80% upon arrival. Reapplied 2 L with sao2 recoverying to >90%) Activity Tolerance: Patient tolerated treatment well;Patient limited by fatigue Patient left: in bed;with call bell/phone within reach;with bed alarm set Nurse Communication: Mobility status PT Visit Diagnosis: Other abnormalities of gait and mobility (R26.89);Muscle weakness (generalized) (M62.81)     Time: 9169-9154 PT Time Calculation (min) (ACUTE ONLY): 15 min  Charges:    $Therapeutic Activity: 8-22 mins PT General Charges $$ ACUTE PT VISIT: 1 Visit                     Rankin Essex PTA 11/27/23, 9:52 AM

## 2023-11-27 NOTE — Progress Notes (Signed)
 Into dialysis during dialysis treatment to follow up with reported concerns of gtube dislodgement. Noted bumper of gtube was in the middle of the entire tube versus touching the abdomen. When writer pushed bumper to stomach bumper stopped at 7 versus 10. Tube remain indwelling and not coming out of the insertion site.  Remain patent and indwelling with gentle tug. Suture noted around tubing and not intact to skin to keep tube stable.  edges of insertion site is touching tubing no skin erosion/truama or drainage noted. Split gauze present. MD Agbata notified; no new orders plans for GI follow up and eval tubing in am.

## 2023-11-28 ENCOUNTER — Inpatient Hospital Stay

## 2023-11-28 DIAGNOSIS — A419 Sepsis, unspecified organism: Secondary | ICD-10-CM | POA: Diagnosis not present

## 2023-11-28 DIAGNOSIS — R6520 Severe sepsis without septic shock: Secondary | ICD-10-CM | POA: Diagnosis not present

## 2023-11-28 LAB — CBC WITH DIFFERENTIAL/PLATELET
Abs Immature Granulocytes: 0.48 10*3/uL — ABNORMAL HIGH (ref 0.00–0.07)
Basophils Absolute: 0.1 10*3/uL (ref 0.0–0.1)
Basophils Relative: 0 %
Eosinophils Absolute: 0 10*3/uL (ref 0.0–0.5)
Eosinophils Relative: 0 %
HCT: 22.4 % — ABNORMAL LOW (ref 39.0–52.0)
Hemoglobin: 7.2 g/dL — ABNORMAL LOW (ref 13.0–17.0)
Immature Granulocytes: 2 %
Lymphocytes Relative: 3 %
Lymphs Abs: 0.8 10*3/uL (ref 0.7–4.0)
MCH: 29 pg (ref 26.0–34.0)
MCHC: 32.1 g/dL (ref 30.0–36.0)
MCV: 90.3 fL (ref 80.0–100.0)
Monocytes Absolute: 2.6 10*3/uL — ABNORMAL HIGH (ref 0.1–1.0)
Monocytes Relative: 10 %
Neutro Abs: 21.4 10*3/uL — ABNORMAL HIGH (ref 1.7–7.7)
Neutrophils Relative %: 85 %
Platelets: 400 10*3/uL (ref 150–400)
RBC: 2.48 MIL/uL — ABNORMAL LOW (ref 4.22–5.81)
RDW: 15.1 % (ref 11.5–15.5)
Smear Review: NORMAL
WBC: 25.4 10*3/uL — ABNORMAL HIGH (ref 4.0–10.5)
nRBC: 0 % (ref 0.0–0.2)

## 2023-11-28 LAB — BASIC METABOLIC PANEL WITH GFR
Anion gap: 14 (ref 5–15)
BUN: 72 mg/dL — ABNORMAL HIGH (ref 8–23)
CO2: 24 mmol/L (ref 22–32)
Calcium: 7.5 mg/dL — ABNORMAL LOW (ref 8.9–10.3)
Chloride: 94 mmol/L — ABNORMAL LOW (ref 98–111)
Creatinine, Ser: 5.26 mg/dL — ABNORMAL HIGH (ref 0.61–1.24)
GFR, Estimated: 10 mL/min — ABNORMAL LOW (ref >60)
Glucose, Bld: 124 mg/dL — ABNORMAL HIGH (ref 70–99)
Potassium: 4 mmol/L (ref 3.5–5.1)
Sodium: 132 mmol/L — ABNORMAL LOW (ref 135–145)

## 2023-11-28 LAB — GLUCOSE, CAPILLARY
Glucose-Capillary: 125 mg/dL — ABNORMAL HIGH (ref 70–99)
Glucose-Capillary: 110 mg/dL — ABNORMAL HIGH (ref 70–99)

## 2023-11-28 MED ORDER — FLORANEX PO PACK
1.0000 g | PACK | Freq: Three times a day (TID) | ORAL | Status: DC
  Administered 2023-11-28 – 2023-12-02 (×11): 1 g
  Filled 2023-11-28 (×18): qty 1

## 2023-11-28 MED ORDER — SIMETHICONE 80 MG PO CHEW
160.0000 mg | CHEWABLE_TABLET | Freq: Once | ORAL | Status: AC
  Administered 2023-11-28: 160 mg via ORAL
  Filled 2023-11-28: qty 2

## 2023-11-28 MED ORDER — DIATRIZOATE MEGLUMINE & SODIUM 66-10 % PO SOLN
30.0000 mL | Freq: Once | ORAL | Status: AC
  Administered 2023-11-28: 30 mL

## 2023-11-28 MED ORDER — PIPERACILLIN-TAZOBACTAM IN DEX 2-0.25 GM/50ML IV SOLN
2.2500 g | Freq: Three times a day (TID) | INTRAVENOUS | Status: AC
  Administered 2023-11-28 – 2023-12-02 (×13): 2.25 g via INTRAVENOUS
  Filled 2023-11-28 (×13): qty 50

## 2023-11-28 MED ORDER — POLYETHYLENE GLYCOL 3350 17 G PO PACK
17.0000 g | PACK | Freq: Every day | ORAL | Status: DC
  Administered 2023-11-28 – 2023-12-02 (×4): 17 g
  Filled 2023-11-28 (×4): qty 1

## 2023-11-28 NOTE — Progress Notes (Signed)
 [facility]  ROUNDING NOTE   Subjective:   Patient found to be resting in bed. Underwent dialysis treatment yesterday. UF achieved was 1 kg. Per physical therapy patient continues to require significant assistance.  Objective:  Vital signs in last 24 hours:  Temp:  [97.5 F (36.4 C)-98.5 F (36.9 C)] 98.5 F (36.9 C) (07/03 0819) Pulse Rate:  [63-87] 82 (07/03 0819) Resp:  [16-31] 24 (07/03 0819) BP: (102-136)/(52-65) 123/57 (07/03 0819) SpO2:  [90 %-100 %] 94 % (07/03 0819) Weight:  [96.9 kg-97.9 kg] 96.9 kg (07/02 2038)  Weight change:  Filed Weights   11/25/23 1300 11/27/23 1745 11/27/23 2038  Weight: 100.7 kg 97.9 kg 96.9 kg    Intake/Output: I/O last 3 completed shifts: In: 1680 [NG/GT:1080; IV Piggyback:600] Out: 1000 [Other:1000]   Intake/Output this shift:  No intake/output data recorded.  Physical Exam: General: Chronically ill-appearing  Head: Oral mucosa moist  Eyes: Anicteric  Lungs:  Diminished  Heart: Regular rate and rhythm  Abdomen:  Peg tube in place   Extremities: Trace peripheral edema.  Neurologic: Alert and oriented to self  Skin: No lesions  Access: Rt internal jugular permcath    Basic Metabolic Panel: Recent Labs  Lab 11/23/23 0415 11/24/23 0404 11/25/23 0437 11/25/23 2158 11/26/23 0409 11/27/23 0452 11/28/23 0931  NA 132*   < > 131* 132* 134* 132* 132*  K 4.6   < > 5.4* 4.5 4.9 5.2* 4.0  CL 95*   < > 91* 94* 96* 95* 94*  CO2 26   < > 23 26 24 23 24   GLUCOSE 120*   < > 131* 123* 137* 97 124*  BUN 56*   < > 100* 63* 72* 99* 72*  CREATININE 4.76*   < > 7.71* 4.95* 5.64* 7.41* 5.26*  CALCIUM  7.5*   < > 7.9* 7.6* 7.6* 7.8* 7.5*  MG  --   --   --   --   --  2.7*  --   PHOS 5.1*  --   --   --   --  7.3*  --    < > = values in this interval not displayed.    Liver Function Tests: Recent Labs  Lab 11/23/23 0415 11/25/23 2158  AST  --  19  ALT  --  13  ALKPHOS  --  77  BILITOT  --  0.2  PROT  --  6.7   ALBUMIN 1.5* 1.8*   No results for input(s): LIPASE, AMYLASE in the last 168 hours. No results for input(s): AMMONIA in the last 168 hours.  CBC: Recent Labs  Lab 11/25/23 0437 11/25/23 2158 11/26/23 0409 11/27/23 0452 11/28/23 0931  WBC 20.3* 20.9*  20.8* 21.0* 23.1* 25.4*  NEUTROABS  --  17.0*  --   --  21.4*  HGB 8.3* 7.6*  7.5* 7.4* 7.4* 7.2*  HCT 25.4* 23.5*  23.3* 23.4* 23.7* 22.4*  MCV 88.8 91.4  91.4 92.1 92.2 90.3  PLT 363 350  332 331 381 400    Cardiac Enzymes: No results for input(s): CKTOTAL, CKMB, CKMBINDEX, TROPONINI in the last 168 hours.  BNP: Invalid input(s): POCBNP  CBG: Recent Labs  Lab 11/27/23 0922 11/27/23 1215 11/27/23 1616 11/27/23 2107 11/28/23 0820  GLUCAP 114* 138* 111* 118* 125*    Microbiology: Results for orders placed or performed during the hospital encounter of 10/05/23  Blood Culture (routine x 2)     Status: None   Collection Time: 10/05/23  5:06 AM  Specimen: BLOOD  Result Value Ref Range Status   Specimen Description BLOOD LA  Final   Special Requests   Final    BOTTLES DRAWN AEROBIC AND ANAEROBIC Blood Culture results may not be optimal due to an inadequate volume of blood received in culture bottles   Culture   Final    NO GROWTH 5 DAYS Performed at [facility], [address] [place], [place] [address]    Report Status 10/10/2023 FINAL  Final  Blood Culture (routine x 2)     Status: None   Collection Time: 10/05/23  5:07 AM   Specimen: BLOOD  Result Value Ref Range Status   Specimen Description BLOOD RA  Final   Special Requests   Final    BOTTLES DRAWN AEROBIC AND ANAEROBIC Blood Culture results may not be optimal due to an inadequate volume of blood received in culture bottles   Culture   Final    NO GROWTH 5 DAYS Performed at [facility], [address] [place], [place] [address]    Report Status 10/10/2023 FINAL  Final  Resp panel by RT-PCR (RSV, Flu A&B,  Covid) Anterior Nasal Swab     Status: None   Collection Time: 10/05/23  5:56 AM   Specimen: Anterior Nasal Swab  Result Value Ref Range Status   SARS Coronavirus 2 by RT PCR NEGATIVE NEGATIVE Final    Comment: (NOTE) SARS-CoV-2 target nucleic acids are NOT DETECTED.  The SARS-CoV-2 RNA is generally detectable in upper respiratory specimens during the acute phase of infection. The lowest concentration of SARS-CoV-2 viral copies this assay can detect is 138 copies/mL. A negative result does not preclude SARS-Cov-2 infection and should not be used as the sole basis for treatment or other patient management decisions. A negative result may occur with  improper specimen collection/handling, submission of specimen other than nasopharyngeal swab, presence of viral mutation(s) within the areas targeted by this assay, and inadequate number of viral copies(<138 copies/mL). A negative result must be combined with clinical observations, patient history, and epidemiological information. The expected result is Negative.  Fact Sheet for Patients:  BloggerCourse.com  Fact Sheet for Healthcare Providers:  SeriousBroker.it  This test is no t yet approved or cleared by the [place]  FDA and  has been authorized for detection and/or diagnosis of SARS-CoV-2 by FDA under an Emergency Use Authorization (EUA). This EUA will remain  in effect (meaning this test can be used) for the duration of the COVID-19 declaration under Section 564(b)(1) of the Act, 21 U.S.C.section 360bbb-3(b)(1), unless the authorization is terminated  or revoked sooner.       Influenza A by PCR NEGATIVE NEGATIVE Final   Influenza B by PCR NEGATIVE NEGATIVE Final    Comment: (NOTE) The Xpert Xpress SARS-CoV-2/FLU/RSV plus assay is intended as an aid in the diagnosis of influenza from Nasopharyngeal swab specimens and should not be used as a sole basis for treatment. Nasal  washings and aspirates are unacceptable for Xpert Xpress SARS-CoV-2/FLU/RSV testing.  Fact Sheet for Patients: BloggerCourse.com  Fact Sheet for Healthcare Providers: SeriousBroker.it  This test is not yet approved or cleared by the [place]  FDA and has been authorized for detection and/or diagnosis of SARS-CoV-2 by FDA under an Emergency Use Authorization (EUA). This EUA will remain in effect (meaning this test can be used) for the duration of the COVID-19 declaration under Section 564(b)(1) of the Act, 21 U.S.C. section 360bbb-3(b)(1), unless the authorization is terminated or revoked.  Resp Syncytial Virus by PCR NEGATIVE NEGATIVE Final    Comment: (NOTE) Fact Sheet for Patients: BloggerCourse.com  Fact Sheet for Healthcare Providers: SeriousBroker.it  This test is not yet approved or cleared by the [place]  FDA and has been authorized for detection and/or diagnosis of SARS-CoV-2 by FDA under an Emergency Use Authorization (EUA). This EUA will remain in effect (meaning this test can be used) for the duration of the COVID-19 declaration under Section 564(b)(1) of the Act, 21 U.S.C. section 360bbb-3(b)(1), unless the authorization is terminated or revoked.  Performed at [facility], [address] [place], [place] [address]   Respiratory (~20 pathogens) panel by PCR     Status: None   Collection Time: 10/05/23  8:11 AM   Specimen: Nasopharyngeal Swab; Respiratory  Result Value Ref Range Status   Adenovirus NOT DETECTED NOT DETECTED Final   Coronavirus 229E NOT DETECTED NOT DETECTED Final    Comment: (NOTE) The Coronavirus on the Respiratory Panel, DOES NOT test for the novel  Coronavirus (2019 nCoV)    Coronavirus HKU1 NOT DETECTED NOT DETECTED Final   Coronavirus NL63 NOT DETECTED NOT DETECTED Final   Coronavirus OC43 NOT DETECTED NOT DETECTED  Final   Metapneumovirus NOT DETECTED NOT DETECTED Final   Rhinovirus / Enterovirus NOT DETECTED NOT DETECTED Final   Influenza A NOT DETECTED NOT DETECTED Final   Influenza B NOT DETECTED NOT DETECTED Final   Parainfluenza Virus 1 NOT DETECTED NOT DETECTED Final   Parainfluenza Virus 2 NOT DETECTED NOT DETECTED Final   Parainfluenza Virus 3 NOT DETECTED NOT DETECTED Final   Parainfluenza Virus 4 NOT DETECTED NOT DETECTED Final   Respiratory Syncytial Virus NOT DETECTED NOT DETECTED Final   Bordetella pertussis NOT DETECTED NOT DETECTED Final   Bordetella Parapertussis NOT DETECTED NOT DETECTED Final   Chlamydophila pneumoniae NOT DETECTED NOT DETECTED Final   Mycoplasma pneumoniae NOT DETECTED NOT DETECTED Final    Comment: Performed at [facility], [address] [place], [place] [address]  Expectorated Sputum Assessment w Gram Stain, Rflx to Resp Cult     Status: None   Collection Time: 10/05/23  9:07 AM   Specimen: Sputum  Result Value Ref Range Status   Specimen Description SPUTUM  Final   Special Requests NONE  Final   Sputum evaluation   Final    Sputum specimen not acceptable for testing.  Please recollect.   C/KERRY NELSON AT 1005 10/05/23.PMF Performed at [facility], [address] [place], [place] [address]    Report Status 10/05/2023 FINAL  Final  Expectorated Sputum Assessment w Gram Stain, Rflx to Resp Cult     Status: None   Collection Time: 10/05/23 10:50 AM  Result Value Ref Range Status   Specimen Description EXPECTORATED SPUTUM  Final   Special Requests NONE  Final   Sputum evaluation   Final    THIS SPECIMEN IS ACCEPTABLE FOR SPUTUM CULTURE Performed at [facility], [address], [place], [place] [address]    Report Status 10/05/2023 FINAL  Final  Culture, Respiratory w Gram Stain     Status: None   Collection Time: 10/05/23 10:50 AM  Result Value Ref Range Status   Specimen Description   Final    EXPECTORATED  SPUTUM Performed at [facility], [address] [place], [place] [address]    Special Requests   Final    NONE Reflexed from [phone] Performed at [facility], [address] [place],  [place] [address]    Gram Stain   Final    RARE WBC SEEN RARE GRAM POSITIVE RODS RARE GRAM POSITIVE COCCI RARE GRAM NEGATIVE RODS    Culture   Final    FEW Normal respiratory flora-no Staph aureus or Pseudomonas seen Performed at [facility], [address], [place], [place] [address]    Report Status 10/07/2023 FINAL  Final  MRSA Next Gen by PCR, Nasal     Status: None   Collection Time: 10/06/23 12:57 AM   Specimen: Nasal Mucosa; Nasal Swab  Result Value Ref Range Status   MRSA by PCR Next Gen NOT DETECTED NOT DETECTED Final    Comment: (NOTE) The GeneXpert MRSA Assay (FDA approved for NASAL specimens only), is one component of a comprehensive MRSA colonization surveillance program. It is not intended to diagnose MRSA infection nor to guide or monitor treatment for MRSA infections. Test performance is not FDA approved in patients less than 63 years old. Performed at [facility], [address] [place], [place] [address]   Culture, Respiratory w Gram Stain     Status: None   Collection Time: 10/06/23 11:37 AM   Specimen: INDUCED SPUTUM  Result Value Ref Range Status   Specimen Description   Final    INDUCED SPUTUM Performed at [facility], [address], [place], [place] [address]    Special Requests   Final    NONE Performed at [facility], [address] [place], [place] [address]    Gram Stain   Final    FEW WBC PRESENT,BOTH PMN AND MONONUCLEAR FEW GRAM POSITIVE RODS    Culture   Final    MODERATE LACTOBACILLUS FERMENTUM Standardized susceptibility testing for this organism is not available. Performed at [facility], [address] [place], [place] [address]    Report Status 10/09/2023 FINAL  Final   Culture, blood (x 2)     Status: None   Collection Time: 10/22/23  1:00 AM   Specimen: BLOOD  Result Value Ref Range Status   Specimen Description BLOOD BLOOD RIGHT ARM  Final   Special Requests   Final    BOTTLES DRAWN AEROBIC AND ANAEROBIC Blood Culture adequate volume   Culture   Final    NO GROWTH 5 DAYS Performed at [facility], [address], [place], [place] [address]    Report Status 10/27/2023 FINAL  Final  Culture, blood (x 2)     Status: None   Collection Time: 10/22/23  1:00 AM   Specimen: BLOOD  Result Value Ref Range Status   Specimen Description BLOOD BLOOD RIGHT ARM  Final   Special Requests   Final    BOTTLES DRAWN AEROBIC AND ANAEROBIC Blood Culture adequate volume   Culture   Final    NO GROWTH 5 DAYS Performed at [facility], [address] [place], [place] [address]    Report Status 10/27/2023 FINAL  Final  Resp panel by RT-PCR (RSV, Flu A&B, Covid) Anterior Nasal Swab     Status: None   Collection Time: 11/09/23  5:31 PM   Specimen: Anterior Nasal Swab  Result Value Ref Range Status   SARS Coronavirus 2 by RT PCR NEGATIVE NEGATIVE Final    Comment: (NOTE) SARS-CoV-2 target nucleic acids are NOT DETECTED.  The SARS-CoV-2 RNA is generally detectable in upper respiratory specimens during the acute phase of infection. The lowest concentration of SARS-CoV-2 viral copies this assay can detect is  138 copies/mL. A negative result does not preclude SARS-Cov-2 infection and should not be used as the sole basis for treatment or other patient management decisions. A negative result may occur with  improper specimen collection/handling, submission of specimen other than nasopharyngeal swab, presence of viral mutation(s) within the areas targeted by this assay, and inadequate number of viral copies(<138 copies/mL). A negative result must be combined with clinical observations, patient history, and epidemiological information. The expected  result is Negative.  Fact Sheet for Patients:  BloggerCourse.com  Fact Sheet for Healthcare Providers:  SeriousBroker.it  This test is no t yet approved or cleared by the [place]  FDA and  has been authorized for detection and/or diagnosis of SARS-CoV-2 by FDA under an Emergency Use Authorization (EUA). This EUA will remain  in effect (meaning this test can be used) for the duration of the COVID-19 declaration under Section 564(b)(1) of the Act, 21 U.S.C.section 360bbb-3(b)(1), unless the authorization is terminated  or revoked sooner.       Influenza A by PCR NEGATIVE NEGATIVE Final   Influenza B by PCR NEGATIVE NEGATIVE Final    Comment: (NOTE) The Xpert Xpress SARS-CoV-2/FLU/RSV plus assay is intended as an aid in the diagnosis of influenza from Nasopharyngeal swab specimens and should not be used as a sole basis for treatment. Nasal washings and aspirates are unacceptable for Xpert Xpress SARS-CoV-2/FLU/RSV testing.  Fact Sheet for Patients: BloggerCourse.com  Fact Sheet for Healthcare Providers: SeriousBroker.it  This test is not yet approved or cleared by the [place]  FDA and has been authorized for detection and/or diagnosis of SARS-CoV-2 by FDA under an Emergency Use Authorization (EUA). This EUA will remain in effect (meaning this test can be used) for the duration of the COVID-19 declaration under Section 564(b)(1) of the Act, 21 U.S.C. section 360bbb-3(b)(1), unless the authorization is terminated or revoked.     Resp Syncytial Virus by PCR NEGATIVE NEGATIVE Final    Comment: (NOTE) Fact Sheet for Patients: BloggerCourse.com  Fact Sheet for Healthcare Providers: SeriousBroker.it  This test is not yet approved or cleared by the [place]  FDA and has been authorized for detection and/or diagnosis of  SARS-CoV-2 by FDA under an Emergency Use Authorization (EUA). This EUA will remain in effect (meaning this test can be used) for the duration of the COVID-19 declaration under Section 564(b)(1) of the Act, 21 U.S.C. section 360bbb-3(b)(1), unless the authorization is terminated or revoked.  Performed at [facility], [address] [place], [place] [address]   Respiratory (~20 pathogens) panel by PCR     Status: None   Collection Time: 11/09/23  5:31 PM   Specimen: Nasopharyngeal Swab; Respiratory  Result Value Ref Range Status   Adenovirus NOT DETECTED NOT DETECTED Final   Coronavirus 229E NOT DETECTED NOT DETECTED Final    Comment: (NOTE) The Coronavirus on the Respiratory Panel, DOES NOT test for the novel  Coronavirus (2019 nCoV)    Coronavirus HKU1 NOT DETECTED NOT DETECTED Final   Coronavirus NL63 NOT DETECTED NOT DETECTED Final   Coronavirus OC43 NOT DETECTED NOT DETECTED Final   Metapneumovirus NOT DETECTED NOT DETECTED Final   Rhinovirus / Enterovirus NOT DETECTED NOT DETECTED Final   Influenza A NOT DETECTED NOT DETECTED Final   Influenza B NOT DETECTED NOT DETECTED Final   Parainfluenza Virus 1 NOT DETECTED NOT DETECTED Final   Parainfluenza Virus 2 NOT DETECTED NOT DETECTED Final   Parainfluenza Virus 3 NOT DETECTED NOT DETECTED Final   Parainfluenza Virus 4  NOT DETECTED NOT DETECTED Final   Respiratory Syncytial Virus NOT DETECTED NOT DETECTED Final   Bordetella pertussis NOT DETECTED NOT DETECTED Final   Bordetella Parapertussis NOT DETECTED NOT DETECTED Final   Chlamydophila pneumoniae NOT DETECTED NOT DETECTED Final   Mycoplasma pneumoniae NOT DETECTED NOT DETECTED Final    Comment: Performed at [facility], [address], [place], [place] [address]  Culture, blood (Routine X 2) w Reflex to ID Panel     Status: None   Collection Time: 11/09/23  6:16 PM   Specimen: BLOOD  Result Value Ref Range Status   Specimen Description   Final    BLOOD  RIGHT ANTECUBITAL Performed at [facility], [address] [place], [place] [address]    Special Requests   Final    BOTTLES DRAWN AEROBIC ONLY Blood Culture adequate volume Performed at [facility], [address] [place] [address] [place], [place] [address]    Culture   Final    NO GROWTH 11 DAYS Performed at [facility], [address], [place], [place] [address]    Report Status 11/20/2023 FINAL  Final  Culture, blood (Routine X 2) w Reflex to ID Panel     Status: None   Collection Time: 11/09/23  6:23 PM   Specimen: BLOOD  Result Value Ref Range Status   Specimen Description   Final    BLOOD BLOOD LEFT FOREARM Performed at [facility], [address], [place], [place] [address]    Special Requests   Final    BOTTLES DRAWN AEROBIC AND ANAEROBIC Blood Culture adequate volume Performed at [facility], [address], [place], [place] [address]    Culture   Final    NO GROWTH 11 DAYS Performed at [facility], [address], [place], [place] [address]    Report Status 11/20/2023 FINAL  Final  Urine Culture     Status: Abnormal   Collection Time: 11/10/23  1:58 AM   Specimen: Urine, Random  Result Value Ref Range Status   Specimen Description   Final    URINE, RANDOM Performed at [facility], [address] [place], [place] [address]    Special Requests   Final    NONE Reflexed from [phone] Performed at [facility], [address] [place], [place] [address]    Culture 60,000 COLONIES/mL ENTEROCOCCUS FAECALIS (A)  Final   Report Status 11/12/2023 FINAL  Final   Organism ID, Bacteria ENTEROCOCCUS FAECALIS (A)  Final      Susceptibility   Enterococcus faecalis - MIC*    AMPICILLIN  <=2 SENSITIVE Sensitive     NITROFURANTOIN <=16 SENSITIVE Sensitive     VANCOMYCIN 1 SENSITIVE Sensitive     * 60,000 COLONIES/mL ENTEROCOCCUS FAECALIS  Culture, blood (x 2)     Status: None (Preliminary result)   Collection  Time: 11/25/23 10:31 PM   Specimen: BLOOD  Result Value Ref Range Status   Specimen Description BLOOD BLOOD LEFT ARM  Final   Special Requests   Final    BOTTLES DRAWN AEROBIC AND ANAEROBIC Blood Culture adequate volume   Culture   Final    NO GROWTH 3 DAYS Performed at [facility], [address], [place], [place] [address]    Report Status PENDING  Incomplete  Culture, blood (x 2)     Status: None (Preliminary result)   Collection Time: 11/25/23 10:31 PM   Specimen: BLOOD  Result Value Ref Range Status  Specimen Description BLOOD BLOOD RIGHT ARM  Final   Special Requests   Final    BOTTLES DRAWN AEROBIC AND ANAEROBIC Blood Culture adequate volume   Culture   Final    NO GROWTH 3 DAYS Performed at [facility], [address] [place], [place] [address]    Report Status PENDING  Incomplete    Coagulation Studies: Recent Labs    11/25/23 2231  LABPROT 15.5*  INR 1.2    Urinalysis: No results for input(s): COLORURINE, LABSPEC, PHURINE, GLUCOSEU, HGBUR, BILIRUBINUR, KETONESUR, PROTEINUR, UROBILINOGEN, NITRITE, LEUKOCYTESUR in the last 72 hours.  Invalid input(s): APPERANCEUR     Imaging: DG ABDOMEN PEG TUBE LOCATION Result Date: 11/28/2023 CLINICAL DATA:  Check of gastrostomy tube positioning after injection of contrast. EXAM: ABDOMEN - 1 VIEW COMPARISON:  CT of abdomen on 11/26/2023 FINDINGS: Contrast was injected via a pre-existing gastrostomy tube. Contrast is present within the stomach and duodenum at the time of the radiograph. A retention balloon is present at the level of the distal stomach/proximal duodenum as seen on the CT. The tubing of the gastrostomy tube itself is difficult to visualize on the x-ray. It appears likely that the retention balloon has migrated distally. IMPRESSION: Contrast injected via the gastrostomy tube is present within the stomach and duodenum at the time of the radiograph. A retention balloon is  present at the level of the distal stomach/proximal duodenum as seen on the CT. The tubing of the gastrostomy tube itself is difficult to visualize on the x-ray. It appears likely that the retention balloon has migrated distally. Recommend retraction of the gastrostomy tube until the retention balloon is felt at the level of the anterior gastric wall. Electronically Signed   By: Marcey Moan M.D.   On: 11/28/2023 09:26   CT ABDOMEN PELVIS WO CONTRAST Result Date: 11/26/2023 CLINICAL DATA:  Dyspnea abdominal pain, fevers, tachypnea, distention EXAM: CT CHEST, ABDOMEN AND PELVIS WITHOUT CONTRAST TECHNIQUE: Multidetector CT imaging of the chest, abdomen and pelvis was performed following the standard protocol without IV contrast. RADIATION DOSE REDUCTION: This exam was performed according to the departmental dose-optimization program which includes automated exposure control, adjustment of the mA and/or kV according to patient size and/or use of iterative reconstruction technique. COMPARISON:  11/02/2023 FINDINGS: CT CHEST FINDINGS Cardiovascular: Large-bore right neck multi lumen vascular catheter. Normal heart size. Three-vessel coronary artery calcifications no pericardial effusion. Mediastinum/Nodes: No enlarged mediastinal, hilar, or axillary lymph nodes. Thyroid  gland, trachea, and esophagus demonstrate no significant findings. Lungs/Pleura: Small bilateral pleural effusions and associated atelectasis or consolidation, similar to prior examination. Musculoskeletal: No chest wall abnormality. No acute osseous findings. CT ABDOMEN PELVIS FINDINGS Hepatobiliary: No solid liver abnormality is seen. Tiny gallstones. No gallbladder wall thickening, or biliary dilatation. Pancreas: Unremarkable. No pancreatic ductal dilatation or surrounding inflammatory changes. Spleen: Normal in size without significant abnormality. Adrenals/Urinary Tract: Adrenal glands are unremarkable. Moderate bilateral hydronephrosis and  hydroureter to the ureterovesicular junctions without calculus or other visible obstruction. Interval increase in extensive bilateral simple attenuation perinephric fluid (series 2, image 80). Severe wall thickening of the decompressed urinary bladder. Stomach/Bowel: Percutaneous gastrostomy. Appendix not clearly visualized. No evidence of bowel wall thickening, distention, or inflammatory changes. Sigmoid diverticulosis. Vascular/Lymphatic: Aortic atherosclerosis. No enlarged abdominal or pelvic lymph nodes. Reproductive: No mass or other abnormality. Other: No abdominal wall hernia.  Anasarca.  No ascites. Musculoskeletal: No acute osseous findings. IMPRESSION: 1. Moderate bilateral hydronephrosis and hydroureter to the ureterovesicular junctions without calculus or other visible obstruction likely secondary  to ureterovesicular junction due to bladder wall edema. 2. Interval increase in extensive bilateral simple attenuation perinephric fluid. 3. Pleural effusion similar to prior examination.  Anasarca. 4. Cholelithiasis. 5. Percutaneous gastrostomy. 6. Coronary artery disease. Aortic Atherosclerosis (ICD10-I70.0). Electronically Signed   By: Marolyn JONETTA Jaksch M.D.   On: 11/26/2023 19:16   CT CHEST WO CONTRAST Result Date: 11/26/2023 CLINICAL DATA:  Dyspnea abdominal pain, fevers, tachypnea, distention EXAM: CT CHEST, ABDOMEN AND PELVIS WITHOUT CONTRAST TECHNIQUE: Multidetector CT imaging of the chest, abdomen and pelvis was performed following the standard protocol without IV contrast. RADIATION DOSE REDUCTION: This exam was performed according to the departmental dose-optimization program which includes automated exposure control, adjustment of the mA and/or kV according to patient size and/or use of iterative reconstruction technique. COMPARISON:  11/02/2023 FINDINGS: CT CHEST FINDINGS Cardiovascular: Large-bore right neck multi lumen vascular catheter. Normal heart size. Three-vessel coronary artery  calcifications no pericardial effusion. Mediastinum/Nodes: No enlarged mediastinal, hilar, or axillary lymph nodes. Thyroid  gland, trachea, and esophagus demonstrate no significant findings. Lungs/Pleura: Small bilateral pleural effusions and associated atelectasis or consolidation, similar to prior examination. Musculoskeletal: No chest wall abnormality. No acute osseous findings. CT ABDOMEN PELVIS FINDINGS Hepatobiliary: No solid liver abnormality is seen. Tiny gallstones. No gallbladder wall thickening, or biliary dilatation. Pancreas: Unremarkable. No pancreatic ductal dilatation or surrounding inflammatory changes. Spleen: Normal in size without significant abnormality. Adrenals/Urinary Tract: Adrenal glands are unremarkable. Moderate bilateral hydronephrosis and hydroureter to the ureterovesicular junctions without calculus or other visible obstruction. Interval increase in extensive bilateral simple attenuation perinephric fluid (series 2, image 80). Severe wall thickening of the decompressed urinary bladder. Stomach/Bowel: Percutaneous gastrostomy. Appendix not clearly visualized. No evidence of bowel wall thickening, distention, or inflammatory changes. Sigmoid diverticulosis. Vascular/Lymphatic: Aortic atherosclerosis. No enlarged abdominal or pelvic lymph nodes. Reproductive: No mass or other abnormality. Other: No abdominal wall hernia.  Anasarca.  No ascites. Musculoskeletal: No acute osseous findings. IMPRESSION: 1. Moderate bilateral hydronephrosis and hydroureter to the ureterovesicular junctions without calculus or other visible obstruction likely secondary to ureterovesicular junction due to bladder wall edema. 2. Interval increase in extensive bilateral simple attenuation perinephric fluid. 3. Pleural effusion similar to prior examination.  Anasarca. 4. Cholelithiasis. 5. Percutaneous gastrostomy. 6. Coronary artery disease. Aortic Atherosclerosis (ICD10-I70.0). Electronically Signed   By: Marolyn JONETTA Jaksch M.D.   On: 11/26/2023 19:16        Medications:    albumin human 25 g (11/27/23 1841)   ceFEPime (MAXIPIME) IV 1 g (11/27/23 2154)   metronidazole  500 mg (11/28/23 1117)   vancomycin 1,000 mg (11/27/23 1853)    sodium chloride    Intravenous Once   Chlorhexidine  Gluconate Cloth  6 each Topical Q0600   epoetin  alfa-epbx (RETACRIT ) injection  10,000 Units Intravenous Q M,W,F-HD   feeding supplement (NEPRO CARB STEADY)  237 mL Per Tube 5 X Daily   feeding supplement (PROSource TF20)  60 mL Per Tube Daily   ferrous sulfate  325 mg Per Tube Daily   free water   60 mL Per Tube 5 X Daily   gentamicin  ointment   Topical TID   heparin  injection (subcutaneous)  5,000 Units Subcutaneous Q8H   insulin  aspart  10 Units Subcutaneous Once   midodrine  5 mg Per Tube TID WC   multivitamin  1 tablet Per Tube QHS   pantoprazole  (PROTONIX ) IV  40 mg Intravenous Q12H   acetaminophen  **OR** acetaminophen , albumin human, artificial tears, bisacodyl , ipratropium-albuterol , [DISCONTINUED] ondansetron  **OR** ondansetron  (ZOFRAN ) IV, mouth rinse  Assessment/ Plan:  Robert Vega is a 83 y.o.  male with obstructive sleep apnea, GERD, obesity, hypertension, dysphagia, severe esophageal dysmotility with achalasia status post PEG tube placement, recent aspiration pneumonia acute respiratory failure status post extubation 10/09/2023, who was [facility] to [facility] on 10[facility]/2023 for Aspiration into respiratory tract, initial encounter [T17.908A] Severe sepsis (HCC) [A41.9, R65.20] History of dysphagia [Z87.898] Fever, unspecified fever cause [R50.9] Multifocal pneumonia [J18.9]   1.  Acute kidney injury.  Baseline creatinine 0.81 from 10/14/2023.  Acute kidney injury secondary to ATN, obstructive uropathy, and progression of prerenal azotemia.   Patient is critically ill.  Robert Vega was started on urgent hemodialysis for severe hyperkalemia and uremia. - Vascular surgery placed PermCath on 6/26.  - TOC dialysis  coordinator notified of need for outpatient dialysis clinic.  Search in progress. - Patient underwent hemodialysis treatment yesterday on 11/27/2023.  Next also treatment scheduled for tomorrow.   2.  Severe hyperkalemia-potassium went from 5.2 down to 4.0 today.  Continue to monitor.  3.  Sepsis due to aspiration pneumonia, found to have distal esophageal obstruction with severe dysmotility.  Robert Vega is status post botulinum toxin injection to the lower esophageal sphincter.  PEG tube placed this admission. Receiving continuous tube feeds with scheduled free water  flushes.   4.Anemia in the setting of renal failure Lab Results  Component Value Date   HGB 7.2 (L) 11/28/2023  Hemoglobin down a bit further to 7.2.  Maintain the patient on Epogen  10,000 units IV with dialysis treatments.   5.  Bilateral hydronephrosis Noted on CT.Urology team has evaluated the patient and it is thought to be secondary to reflux.  Due to health, further intervention deferred at this time.     LOS: 54 Briannon Boggio 7/3/202511:43 AM

## 2023-11-28 NOTE — Evaluation (Signed)
 Occupational Therapy ReEvaluation Patient Details Name: Robert Vega MRN: [id number] DOB: June 23, 1940 Today's Date: 11/28/2023   History of Present Illness   83 y.o male with significant PMH of OSA, GERD, Obesity, HTN, Dysphagia: EGD 03/2022 with food in upper esophagus complicated by aspiration event, cardiac arrest with round of CPR, and post resuscitation EGD with concern for lack of peristalsis. Pt presented to ED on 10/05/2023 with hypoxia, fever and generalized weakness; developed acute respiratory failure requiring intubation 5/10 due to aspiration pneumonia, extubated 5/15, but had significant agitation required brief course of Precedex ; pt now s/p exploratory laparotomy with closure of gastrotomy and insertion of gastrostomy tube on 10/22/23.  PT order discontinued by MD 11/10/23 and new PT consult received 11/14/23.  Pt s/p R temporary femoral vein dialysis catheter placement 11/11/23; now removed; pt now with HD perm cath.     Clinical Impressions Pt seen for OT re-evaluation following permanent HD catheter placement and prolonged hospital stay. Upon arrival to room pt in bed noted to be incontinent of stool, co-tx with PT for safety. Pt disoriented to self, location, and date this session - significantly decreased participation/ command following compared to prior session. Pt requires TOTAL A x2 L+R rolling at bed level and pericare. Pt mentation limiting progress,goals and discharge recommendation remains appropriate.       If plan is discharge home, recommend the following:   Two people to help with walking and/or transfers;Two people to help with bathing/dressing/bathroom     Functional Status Assessment         Equipment Recommendations   Other (comment) (defer)     Recommendations for Other Services         Precautions/Restrictions   Precautions Precautions: Fall Recall of Precautions/Restrictions: Impaired Restrictions Weight Bearing Restrictions Per  Provider Order: No Other Position/Activity Restrictions: Safety mitts donned     Mobility Bed Mobility Overal bed mobility: Needs Assistance Bed Mobility: Rolling Rolling: Total assist, +2 for physical assistance         General bed mobility comments: pt resistive to movement    Transfers                   General transfer comment: Not appropriate at this time          ADL either performed or assessed with clinical judgement   ADL Overall ADL's : Needs assistance/impaired                                       General ADL Comments: TOTAL A x2 toileting at bed level     Vision         Perception         Praxis         Pertinent Vitals/Pain Pain Assessment Pain Assessment: PAINAD Breathing: occasional labored breathing, short period of hyperventilation Negative Vocalization: occasional moan/groan, low speech, negative/disapproving quality Facial Expression: smiling or inexpressive Body Language: tense, distressed pacing, fidgeting Consolability: distracted or reassured by voice/touch PAINAD Score: 4 Pain Location: generalized with bed mobility Pain Descriptors / Indicators: Grimacing, Guarding Pain Intervention(s): Limited activity within patient's tolerance, Repositioned     Extremity/Trunk Assessment Upper Extremity Assessment Upper Extremity Assessment: Generalized weakness   Lower Extremity Assessment Lower Extremity Assessment: Generalized weakness       Communication Communication Communication: Impaired Factors Affecting Communication: Reduced clarity of speech   Cognition Arousal: Alert Behavior During Therapy: Impulsive,  Restless Cognition: Cognition impaired   Orientation impairments: Person, Place, Time, Situation         OT - Cognition Comments: unable to state name or follow commands this session                 Following commands: Impaired Following commands impaired: Follows one step commands  inconsistently     Cueing  General Comments   Cueing Techniques: Verbal cues;Tactile cues;Visual cues          OT Goals(Current goals can be found in the care plan section)   Acute Rehab OT Goals Patient Stated Goal: unable to verbalize OT Goal Formulation: Patient unable to participate in goal setting Time For Goal Achievement: 12/26/23 Potential to Achieve Goals: Fair ADL Goals Pt Will Perform Grooming: with supervision;sitting Pt Will Perform Lower Body Dressing: with mod assist;with min assist;sit to/from stand Pt Will Transfer to Toilet: with mod assist;bedside commode;stand pivot transfer Pt Will Perform Toileting - Clothing Manipulation and hygiene: with min assist;sit to/from stand   OT Frequency:  Min 1X/week    Co-evaluation PT/OT/SLP Co-Evaluation/Treatment: Yes Reason for Co-Treatment: Necessary to address cognition/behavior during functional activity;For patient/therapist safety;To address functional/ADL transfers PT goals addressed during session: Mobility/safety with mobility OT goals addressed during session: ADL's and self-care      AM-PAC OT 6 Clicks Daily Activity     Outcome Measure Help from another person eating meals?: A Lot Help from another person taking care of personal grooming?: A Lot Help from another person toileting, which includes using toliet, bedpan, or urinal?: Total Help from another person bathing (including washing, rinsing, drying)?: Total Help from another person to put on and taking off regular upper body clothing?: A Lot Help from another person to put on and taking off regular lower body clothing?: A Lot 6 Click Score: 10   End of Session Equipment Utilized During Treatment: Oxygen  Activity Tolerance: Patient tolerated treatment well Patient left: in bed;with call bell/phone within reach;with bed alarm set  OT Visit Diagnosis: Unsteadiness on feet (R26.81);Repeated falls (R29.6);Muscle weakness (generalized) (M62.81)                 Time: 8948-8892 OT Time Calculation (min): 16 min Charges:  OT General Charges $OT Visit: 1 Visit OT Evaluation $OT Re-eval: 1 Re-eval  Robert Vega, M.S. OTR/L  11/28/23, 1:31 PM  ascom [phone]

## 2023-11-28 NOTE — Progress Notes (Signed)
 INFECTIOUS DISEASE PROGRESS NOTE Date of Admission:  10/05/2023     ID: Robert Vega is a 83 y.o. male with lrukocytosis Principal Problem:   Severe sepsis (HCC) Active Problems:   Encounter for dialysis and dialysis catheter care (HCC)   GERD (gastroesophageal reflux disease)   Dysphagia   Essential hypertension, benign   Multifocal pneumonia   Acute respiratory failure with hypoxia and hypercapnia (HCC)   History of dysphagia   Aspiration pneumonia (HCC)   Achalasia of esophagus   Acute delirium   Obesity (BMI 30-39.9)   Generalized weakness   Gastrostomy complication (HCC)   AKI (acute kidney injury) (HCC)   Generalized abdominal pain   Malnutrition of moderate degree   Melena   Subjective: No fevers, no clinical change  ROS  Unable to obtain   Medications:  Antibiotics Given (last 72 hours)     Date/Time Action Medication Dose Rate   11/25/23 2313 New Bag/Given   vancomycin (VANCOCIN) IVPB 1000 mg/200 mL premix 1,000 mg 200 mL/hr   11/26/23 0022 New Bag/Given   ceFEPIme (MAXIPIME) 1 g in sodium chloride  0.9 % 100 mL IVPB 1 g 200 mL/hr   11/26/23 0104 New Bag/Given   metroNIDAZOLE  (FLAGYL ) IVPB 500 mg 500 mg 100 mL/hr   11/26/23 0220 New Bag/Given   vancomycin (VANCOREADY) IVPB 1250 mg/250 mL 1,250 mg 166.7 mL/hr   11/26/23 1025 New Bag/Given   metroNIDAZOLE  (FLAGYL ) IVPB 500 mg 500 mg 100 mL/hr   11/26/23 2354 New Bag/Given  [Loss of IV access]   ceFEPIme (MAXIPIME) 1 g in sodium chloride  0.9 % 100 mL IVPB 1 g 200 mL/hr   11/27/23 0030 New Bag/Given   metroNIDAZOLE  (FLAGYL ) IVPB 500 mg 500 mg 100 mL/hr   11/27/23 1129 New Bag/Given   metroNIDAZOLE  (FLAGYL ) IVPB 500 mg 500 mg 100 mL/hr   11/27/23 1853 New Bag/Given  [pt at dialysis per dialysis administered at dialysis]   vancomycin (VANCOCIN) IVPB 1000 mg/200 mL premix 1,000 mg 200 mL/hr   11/27/23 2154 New Bag/Given   ceFEPIme (MAXIPIME) 1 g in sodium chloride  0.9 % 100 mL IVPB 1 g 200 mL/hr    11/27/23 2341 New Bag/Given   metroNIDAZOLE  (FLAGYL ) IVPB 500 mg 500 mg 100 mL/hr   11/28/23 1117 New Bag/Given   metroNIDAZOLE  (FLAGYL ) IVPB 500 mg 500 mg 100 mL/hr       sodium chloride    Intravenous Once   Chlorhexidine  Gluconate Cloth  6 each Topical Q0600   epoetin  alfa-epbx (RETACRIT ) injection  10,000 Units Intravenous Q M,W,F-HD   feeding supplement (NEPRO CARB STEADY)  237 mL Per Tube 5 X Daily   feeding supplement (PROSource TF20)  60 mL Per Tube Daily   ferrous sulfate  325 mg Per Tube Daily   free water   60 mL Per Tube 5 X Daily   gentamicin  ointment   Topical TID   heparin  injection (subcutaneous)  5,000 Units Subcutaneous Q8H   insulin  aspart  10 Units Subcutaneous Once   midodrine  5 mg Per Tube TID WC   multivitamin  1 tablet Per Tube QHS   pantoprazole  (PROTONIX ) IV  40 mg Intravenous Q12H    Objective: Vital signs in last 24 hours: Temp:  [97.5 F (36.4 C)-98.5 F (36.9 C)] 98.5 F (36.9 C) (07/03 0819) Pulse Rate:  [63-87] 82 (07/03 0819) Resp:  [16-31] 24 (07/03 0819) BP: (102-136)/(52-65) 123/57 (07/03 0819) SpO2:  [90 %-100 %] 94 % (07/03 0819) Weight:  [96.9 kg-97.9 kg] 96.9 kg (  07/02 2038) Constitutional: lethargic, chronically ill appearig, grunting HENT: anicteric Mouth/Throat: Oropharynx is dry Cardiovascular: Normal rate, regular rhythm and normal heart sounds.  Pulmonary/Chest: poor air movement, decreased BS bil bases  Abdominal: Soft. But distended, PEG tube in place, healed abd incision Neurological: lethargic Skin: multiple bruises Acces R chest wall HD cath in place  Lab Results Recent Labs    11/27/23 0452 11/28/23 0931  WBC 23.1* 25.4*  HGB 7.4* 7.2*  HCT 23.7* 22.4*  NA 132* 132*  K 5.2* 4.0  CL 95* 94*  CO2 23 24  BUN 99* 72*  CREATININE 7.41* 5.26*    Microbiology: Results for orders placed or performed during the hospital encounter of 10/05/23  Blood Culture (routine x 2)     Status: None   Collection Time: 10/05/23   5:06 AM   Specimen: BLOOD  Result Value Ref Range Status   Specimen Description BLOOD LA  Final   Special Requests   Final    BOTTLES DRAWN AEROBIC AND ANAEROBIC Blood Culture results may not be optimal due to an inadequate volume of blood received in culture bottles   Culture   Final    NO GROWTH 5 DAYS Performed at [facility], [address] [place], [place] [address]    Report Status 10/10/2023 FINAL  Final  Blood Culture (routine x 2)     Status: None   Collection Time: 10/05/23  5:07 AM   Specimen: BLOOD  Result Value Ref Range Status   Specimen Description BLOOD RA  Final   Special Requests   Final    BOTTLES DRAWN AEROBIC AND ANAEROBIC Blood Culture results may not be optimal due to an inadequate volume of blood received in culture bottles   Culture   Final    NO GROWTH 5 DAYS Performed at [facility], [address] [place], [place] [address]    Report Status 10/10/2023 FINAL  Final  Resp panel by RT-PCR (RSV, Flu A&B, Covid) Anterior Nasal Swab     Status: None   Collection Time: 10/05/23  5:56 AM   Specimen: Anterior Nasal Swab  Result Value Ref Range Status   SARS Coronavirus 2 by RT PCR NEGATIVE NEGATIVE Final    Comment: (NOTE) SARS-CoV-2 target nucleic acids are NOT DETECTED.  The SARS-CoV-2 RNA is generally detectable in upper respiratory specimens during the acute phase of infection. The lowest concentration of SARS-CoV-2 viral copies this assay can detect is 138 copies/mL. A negative result does not preclude SARS-Cov-2 infection and should not be used as the sole basis for treatment or other patient management decisions. A negative result may occur with  improper specimen collection/handling, submission of specimen other than nasopharyngeal swab, presence of viral mutation(s) within the areas targeted by this assay, and inadequate number of viral copies(<138 copies/mL). A negative result must be combined with clinical observations,  patient history, and epidemiological information. The expected result is Negative.  Fact Sheet for Patients:  BloggerCourse.com  Fact Sheet for Healthcare Providers:  SeriousBroker.it  This test is no t yet approved or cleared by the [place]  FDA and  has been authorized for detection and/or diagnosis of SARS-CoV-2 by FDA under an Emergency Use Authorization (EUA). This EUA will remain  in effect (meaning this test can be used) for the duration of the COVID-19 declaration under Section 564(b)(1) of the Act, 21 U.S.C.section 360bbb-3(b)(1), unless the authorization is terminated  or revoked sooner.       Influenza A by PCR  NEGATIVE NEGATIVE Final   Influenza B by PCR NEGATIVE NEGATIVE Final    Comment: (NOTE) The Xpert Xpress SARS-CoV-2/FLU/RSV plus assay is intended as an aid in the diagnosis of influenza from Nasopharyngeal swab specimens and should not be used as a sole basis for treatment. Nasal washings and aspirates are unacceptable for Xpert Xpress SARS-CoV-2/FLU/RSV testing.  Fact Sheet for Patients: BloggerCourse.com  Fact Sheet for Healthcare Providers: SeriousBroker.it  This test is not yet approved or cleared by the [place]  FDA and has been authorized for detection and/or diagnosis of SARS-CoV-2 by FDA under an Emergency Use Authorization (EUA). This EUA will remain in effect (meaning this test can be used) for the duration of the COVID-19 declaration under Section 564(b)(1) of the Act, 21 U.S.C. section 360bbb-3(b)(1), unless the authorization is terminated or revoked.     Resp Syncytial Virus by PCR NEGATIVE NEGATIVE Final    Comment: (NOTE) Fact Sheet for Patients: BloggerCourse.com  Fact Sheet for Healthcare Providers: SeriousBroker.it  This test is not yet approved or cleared by the [place] FDA and has been authorized for detection and/or diagnosis of SARS-CoV-2 by FDA under an Emergency Use Authorization (EUA). This EUA will remain in effect (meaning this test can be used) for the duration of the COVID-19 declaration under Section 564(b)(1) of the Act, 21 U.S.C. section 360bbb-3(b)(1), unless the authorization is terminated or revoked.  Performed at [facility], [address] [place], [place] [address]   Respiratory (~20 pathogens) panel by PCR     Status: None   Collection Time: 10/05/23  8:11 AM   Specimen: Nasopharyngeal Swab; Respiratory  Result Value Ref Range Status   Adenovirus NOT DETECTED NOT DETECTED Final   Coronavirus 229E NOT DETECTED NOT DETECTED Final    Comment: (NOTE) The Coronavirus on the Respiratory Panel, DOES NOT test for the novel  Coronavirus (2019 nCoV)    Coronavirus HKU1 NOT DETECTED NOT DETECTED Final   Coronavirus NL63 NOT DETECTED NOT DETECTED Final   Coronavirus OC43 NOT DETECTED NOT DETECTED Final   Metapneumovirus NOT DETECTED NOT DETECTED Final   Rhinovirus / Enterovirus NOT DETECTED NOT DETECTED Final   Influenza A NOT DETECTED NOT DETECTED Final   Influenza B NOT DETECTED NOT DETECTED Final   Parainfluenza Virus 1 NOT DETECTED NOT DETECTED Final   Parainfluenza Virus 2 NOT DETECTED NOT DETECTED Final   Parainfluenza Virus 3 NOT DETECTED NOT DETECTED Final   Parainfluenza Virus 4 NOT DETECTED NOT DETECTED Final   Respiratory Syncytial Virus NOT DETECTED NOT DETECTED Final   Bordetella pertussis NOT DETECTED NOT DETECTED Final   Bordetella Parapertussis NOT DETECTED NOT DETECTED Final   Chlamydophila pneumoniae NOT DETECTED NOT DETECTED Final   Mycoplasma pneumoniae NOT DETECTED NOT DETECTED Final    Comment: Performed at [facility], [address] [place], [place] [address]  Expectorated Sputum Assessment w Gram Stain, Rflx to Resp Cult     Status: None   Collection Time: 10/05/23  9:07 AM    Specimen: Sputum  Result Value Ref Range Status   Specimen Description SPUTUM  Final   Special Requests NONE  Final   Sputum evaluation   Final    Sputum specimen not acceptable for testing.  Please recollect.   C/KERRY NELSON AT 1005 10/05/23.PMF Performed at [facility], [address] [place], [place] [address]    Report Status 10/05/2023 FINAL  Final  Expectorated Sputum Assessment w Gram Stain, Rflx to Resp Cult  Status: None   Collection Time: 10/05/23 10:50 AM  Result Value Ref Range Status   Specimen Description EXPECTORATED SPUTUM  Final   Special Requests NONE  Final   Sputum evaluation   Final    THIS SPECIMEN IS ACCEPTABLE FOR SPUTUM CULTURE Performed at [facility], [address] [place], [place] [address]    Report Status 10/05/2023 FINAL  Final  Culture, Respiratory w Gram Stain     Status: None   Collection Time: 10/05/23 10:50 AM  Result Value Ref Range Status   Specimen Description   Final    EXPECTORATED SPUTUM Performed at [facility], [address], [place], [place] [address]    Special Requests   Final    NONE Reflexed from [phone] Performed at [facility], [address] [place], [place] [address]    Gram Stain   Final    RARE WBC SEEN RARE VONNE POSITIVE RODS RARE VONNE POSITIVE COCCI RARE GRAM NEGATIVE RODS    Culture   Final    FEW Normal respiratory flora-no Staph aureus or Pseudomonas seen Performed at [facility], [address], [place], [place] [address]    Report Status 10/07/2023 FINAL  Final  MRSA Next Gen by PCR, Nasal     Status: None   Collection Time: 10/06/23 12:57 AM   Specimen: Nasal Mucosa; Nasal Swab  Result Value Ref Range Status   MRSA by PCR Next Gen NOT DETECTED NOT DETECTED Final    Comment: (NOTE) The GeneXpert MRSA Assay (FDA approved for NASAL specimens only), is one component of a comprehensive MRSA colonization surveillance program. It is not intended to  diagnose MRSA infection nor to guide or monitor treatment for MRSA infections. Test performance is not FDA approved in patients less than 58 years old. Performed at [facility], [address] [place], [place] [address]   Culture, Respiratory w Gram Stain     Status: None   Collection Time: 10/06/23 11:37 AM   Specimen: INDUCED SPUTUM  Result Value Ref Range Status   Specimen Description   Final    INDUCED SPUTUM Performed at [facility], [address], [place], [place] [address]    Special Requests   Final    NONE Performed at [facility], [address] [place], [place] [address]    Gram Stain   Final    FEW WBC PRESENT,BOTH PMN AND MONONUCLEAR FEW GRAM POSITIVE RODS    Culture   Final    MODERATE LACTOBACILLUS FERMENTUM Standardized susceptibility testing for this organism is not available. Performed at [facility], [address] [place], [place] [address]    Report Status 10/09/2023 FINAL  Final  Culture, blood (x 2)     Status: None   Collection Time: 10/22/23  1:00 AM   Specimen: BLOOD  Result Value Ref Range Status   Specimen Description BLOOD BLOOD RIGHT ARM  Final   Special Requests   Final    BOTTLES DRAWN AEROBIC AND ANAEROBIC Blood Culture adequate volume   Culture   Final    NO GROWTH 5 DAYS Performed at [facility], [address] [place], [place] [address]    Report Status 10/27/2023 FINAL  Final  Culture, blood (x 2)     Status: None   Collection Time: 10/22/23  1:00 AM   Specimen: BLOOD  Result Value Ref Range Status   Specimen Description BLOOD BLOOD RIGHT ARM  Final  Special Requests   Final    BOTTLES DRAWN AEROBIC AND ANAEROBIC Blood Culture adequate volume   Culture   Final    NO GROWTH 5 DAYS Performed at [facility], [address] [place], [place] [address]    Report Status 10/27/2023 FINAL  Final  Resp panel by RT-PCR (RSV, Flu A&B, Covid) Anterior Nasal Swab     Status:  None   Collection Time: 11/09/23  5:31 PM   Specimen: Anterior Nasal Swab  Result Value Ref Range Status   SARS Coronavirus 2 by RT PCR NEGATIVE NEGATIVE Final    Comment: (NOTE) SARS-CoV-2 target nucleic acids are NOT DETECTED.  The SARS-CoV-2 RNA is generally detectable in upper respiratory specimens during the acute phase of infection. The lowest concentration of SARS-CoV-2 viral copies this assay can detect is 138 copies/mL. A negative result does not preclude SARS-Cov-2 infection and should not be used as the sole basis for treatment or other patient management decisions. A negative result may occur with  improper specimen collection/handling, submission of specimen other than nasopharyngeal swab, presence of viral mutation(s) within the areas targeted by this assay, and inadequate number of viral copies(<138 copies/mL). A negative result must be combined with clinical observations, patient history, and epidemiological information. The expected result is Negative.  Fact Sheet for Patients:  BloggerCourse.com  Fact Sheet for Healthcare Providers:  SeriousBroker.it  This test is no t yet approved or cleared by the [place]  FDA and  has been authorized for detection and/or diagnosis of SARS-CoV-2 by FDA under an Emergency Use Authorization (EUA). This EUA will remain  in effect (meaning this test can be used) for the duration of the COVID-19 declaration under Section 564(b)(1) of the Act, 21 U.S.C.section 360bbb-3(b)(1), unless the authorization is terminated  or revoked sooner.       Influenza A by PCR NEGATIVE NEGATIVE Final   Influenza B by PCR NEGATIVE NEGATIVE Final    Comment: (NOTE) The Xpert Xpress SARS-CoV-2/FLU/RSV plus assay is intended as an aid in the diagnosis of influenza from Nasopharyngeal swab specimens and should not be used as a sole basis for treatment. Nasal washings and aspirates are unacceptable  for Xpert Xpress SARS-CoV-2/FLU/RSV testing.  Fact Sheet for Patients: BloggerCourse.com  Fact Sheet for Healthcare Providers: SeriousBroker.it  This test is not yet approved or cleared by the [place]  FDA and has been authorized for detection and/or diagnosis of SARS-CoV-2 by FDA under an Emergency Use Authorization (EUA). This EUA will remain in effect (meaning this test can be used) for the duration of the COVID-19 declaration under Section 564(b)(1) of the Act, 21 U.S.C. section 360bbb-3(b)(1), unless the authorization is terminated or revoked.     Resp Syncytial Virus by PCR NEGATIVE NEGATIVE Final    Comment: (NOTE) Fact Sheet for Patients: BloggerCourse.com  Fact Sheet for Healthcare Providers: SeriousBroker.it  This test is not yet approved or cleared by the [place]  FDA and has been authorized for detection and/or diagnosis of SARS-CoV-2 by FDA under an Emergency Use Authorization (EUA). This EUA will remain in effect (meaning this test can be used) for the duration of the COVID-19 declaration under Section 564(b)(1) of the Act, 21 U.S.C. section 360bbb-3(b)(1), unless the authorization is terminated or revoked.  Performed at [facility], [address] [place], [place] [address]   Respiratory (~20 pathogens) panel by PCR     Status: None   Collection Time: 11/09/23  5:31 PM   Specimen: Nasopharyngeal Swab; Respiratory  Result  Value Ref Range Status   Adenovirus NOT DETECTED NOT DETECTED Final   Coronavirus 229E NOT DETECTED NOT DETECTED Final    Comment: (NOTE) The Coronavirus on the Respiratory Panel, DOES NOT test for the novel  Coronavirus (2019 nCoV)    Coronavirus HKU1 NOT DETECTED NOT DETECTED Final   Coronavirus NL63 NOT DETECTED NOT DETECTED Final   Coronavirus OC43 NOT DETECTED NOT DETECTED Final   Metapneumovirus NOT DETECTED NOT  DETECTED Final   Rhinovirus / Enterovirus NOT DETECTED NOT DETECTED Final   Influenza A NOT DETECTED NOT DETECTED Final   Influenza B NOT DETECTED NOT DETECTED Final   Parainfluenza Virus 1 NOT DETECTED NOT DETECTED Final   Parainfluenza Virus 2 NOT DETECTED NOT DETECTED Final   Parainfluenza Virus 3 NOT DETECTED NOT DETECTED Final   Parainfluenza Virus 4 NOT DETECTED NOT DETECTED Final   Respiratory Syncytial Virus NOT DETECTED NOT DETECTED Final   Bordetella pertussis NOT DETECTED NOT DETECTED Final   Bordetella Parapertussis NOT DETECTED NOT DETECTED Final   Chlamydophila pneumoniae NOT DETECTED NOT DETECTED Final   Mycoplasma pneumoniae NOT DETECTED NOT DETECTED Final    Comment: Performed at [facility], [address], [place], [place] [address]  Culture, blood (Routine X 2) w Reflex to ID Panel     Status: None   Collection Time: 11/09/23  6:16 PM   Specimen: BLOOD  Result Value Ref Range Status   Specimen Description   Final    BLOOD RIGHT ANTECUBITAL Performed at [facility], [address] [place], [place] [address]    Special Requests   Final    BOTTLES DRAWN AEROBIC ONLY Blood Culture adequate volume Performed at [facility], [address], [place], [place] [address]    Culture   Final    NO GROWTH 11 DAYS Performed at [facility], [address] [place], [place] [address]    Report Status 11/20/2023 FINAL  Final  Culture, blood (Routine X 2) w Reflex to ID Panel     Status: None   Collection Time: 11/09/23  6:23 PM   Specimen: BLOOD  Result Value Ref Range Status   Specimen Description   Final    BLOOD BLOOD LEFT FOREARM Performed at [facility], [address], [place], [place] [address]    Special Requests   Final    BOTTLES DRAWN AEROBIC AND ANAEROBIC Blood Culture adequate volume Performed at [facility], [address] [place], [place] [address]    Culture   Final    NO GROWTH 11  DAYS Performed at [facility], [address] [place], [place] [address]    Report Status 11/20/2023 FINAL  Final  Urine Culture     Status: Abnormal   Collection Time: 11/10/23  1:58 AM   Specimen: Urine, Random  Result Value Ref Range Status   Specimen Description   Final    URINE, RANDOM Performed at [facility], [address] [place], [place] [address]    Special Requests   Final    NONE Reflexed from [phone] Performed at [facility], [address] [place], [place] [address]    Culture 60,000 COLONIES/mL ENTEROCOCCUS FAECALIS (A)  Final   Report Status 11/12/2023 FINAL  Final   Organism ID, Bacteria ENTEROCOCCUS FAECALIS (A)  Final      Susceptibility   Enterococcus faecalis - MIC*    AMPICILLIN  <=2 SENSITIVE Sensitive     NITROFURANTOIN <=16  SENSITIVE Sensitive     VANCOMYCIN 1 SENSITIVE Sensitive     * 60,000 COLONIES/mL ENTEROCOCCUS FAECALIS  Culture, blood (x 2)     Status: None (Preliminary result)   Collection Time: 11/25/23 10:31 PM   Specimen: BLOOD  Result Value Ref Range Status   Specimen Description BLOOD BLOOD LEFT ARM  Final   Special Requests   Final    BOTTLES DRAWN AEROBIC AND ANAEROBIC Blood Culture adequate volume   Culture   Final    NO GROWTH 3 DAYS Performed at [facility], [address] [place], [place] [address]    Report Status PENDING  Incomplete  Culture, blood (x 2)     Status: None (Preliminary result)   Collection Time: 11/25/23 10:31 PM   Specimen: BLOOD  Result Value Ref Range Status   Specimen Description BLOOD BLOOD RIGHT ARM  Final   Special Requests   Final    BOTTLES DRAWN AEROBIC AND ANAEROBIC Blood Culture adequate volume   Culture   Final    NO GROWTH 3 DAYS Performed at [facility], [address] [place], [place] [address]    Report Status PENDING  Incomplete    Studies/Results: DG ABDOMEN PEG TUBE LOCATION Result Date: 11/28/2023 CLINICAL DATA:  Check of  gastrostomy tube positioning after injection of contrast. EXAM: ABDOMEN - 1 VIEW COMPARISON:  CT of abdomen on 11/26/2023 FINDINGS: Contrast was injected via a pre-existing gastrostomy tube. Contrast is present within the stomach and duodenum at the time of the radiograph. A retention balloon is present at the level of the distal stomach/proximal duodenum as seen on the CT. The tubing of the gastrostomy tube itself is difficult to visualize on the x-ray. It appears likely that the retention balloon has migrated distally. IMPRESSION: Contrast injected via the gastrostomy tube is present within the stomach and duodenum at the time of the radiograph. A retention balloon is present at the level of the distal stomach/proximal duodenum as seen on the CT. The tubing of the gastrostomy tube itself is difficult to visualize on the x-ray. It appears likely that the retention balloon has migrated distally. Recommend retraction of the gastrostomy tube until the retention balloon is felt at the level of the anterior gastric wall. Electronically Signed   By: Marcey Moan M.D.   On: 11/28/2023 09:26   CT ABDOMEN PELVIS WO CONTRAST Result Date: 11/26/2023 CLINICAL DATA:  Dyspnea abdominal pain, fevers, tachypnea, distention EXAM: CT CHEST, ABDOMEN AND PELVIS WITHOUT CONTRAST TECHNIQUE: Multidetector CT imaging of the chest, abdomen and pelvis was performed following the standard protocol without IV contrast. RADIATION DOSE REDUCTION: This exam was performed according to the departmental dose-optimization program which includes automated exposure control, adjustment of the mA and/or kV according to patient size and/or use of iterative reconstruction technique. COMPARISON:  11/02/2023 FINDINGS: CT CHEST FINDINGS Cardiovascular: Large-bore right neck multi lumen vascular catheter. Normal heart size. Three-vessel coronary artery calcifications no pericardial effusion. Mediastinum/Nodes: No enlarged mediastinal, hilar, or axillary  lymph nodes. Thyroid  gland, trachea, and esophagus demonstrate no significant findings. Lungs/Pleura: Small bilateral pleural effusions and associated atelectasis or consolidation, similar to prior examination. Musculoskeletal: No chest wall abnormality. No acute osseous findings. CT ABDOMEN PELVIS FINDINGS Hepatobiliary: No solid liver abnormality is seen. Tiny gallstones. No gallbladder wall thickening, or biliary dilatation. Pancreas: Unremarkable. No pancreatic ductal dilatation or surrounding inflammatory changes. Spleen: Normal in size without significant abnormality. Adrenals/Urinary Tract: Adrenal glands are unremarkable. Moderate bilateral hydronephrosis and hydroureter to the ureterovesicular junctions  without calculus or other visible obstruction. Interval increase in extensive bilateral simple attenuation perinephric fluid (series 2, image 80). Severe wall thickening of the decompressed urinary bladder. Stomach/Bowel: Percutaneous gastrostomy. Appendix not clearly visualized. No evidence of bowel wall thickening, distention, or inflammatory changes. Sigmoid diverticulosis. Vascular/Lymphatic: Aortic atherosclerosis. No enlarged abdominal or pelvic lymph nodes. Reproductive: No mass or other abnormality. Other: No abdominal wall hernia.  Anasarca.  No ascites. Musculoskeletal: No acute osseous findings. IMPRESSION: 1. Moderate bilateral hydronephrosis and hydroureter to the ureterovesicular junctions without calculus or other visible obstruction likely secondary to ureterovesicular junction due to bladder wall edema. 2. Interval increase in extensive bilateral simple attenuation perinephric fluid. 3. Pleural effusion similar to prior examination.  Anasarca. 4. Cholelithiasis. 5. Percutaneous gastrostomy. 6. Coronary artery disease. Aortic Atherosclerosis (ICD10-I70.0). Electronically Signed   By: Marolyn JONETTA Jaksch M.D.   On: 11/26/2023 19:16   CT CHEST WO CONTRAST Result Date: 11/26/2023 CLINICAL DATA:   Dyspnea abdominal pain, fevers, tachypnea, distention EXAM: CT CHEST, ABDOMEN AND PELVIS WITHOUT CONTRAST TECHNIQUE: Multidetector CT imaging of the chest, abdomen and pelvis was performed following the standard protocol without IV contrast. RADIATION DOSE REDUCTION: This exam was performed according to the departmental dose-optimization program which includes automated exposure control, adjustment of the mA and/or kV according to patient size and/or use of iterative reconstruction technique. COMPARISON:  11/02/2023 FINDINGS: CT CHEST FINDINGS Cardiovascular: Large-bore right neck multi lumen vascular catheter. Normal heart size. Three-vessel coronary artery calcifications no pericardial effusion. Mediastinum/Nodes: No enlarged mediastinal, hilar, or axillary lymph nodes. Thyroid  gland, trachea, and esophagus demonstrate no significant findings. Lungs/Pleura: Small bilateral pleural effusions and associated atelectasis or consolidation, similar to prior examination. Musculoskeletal: No chest wall abnormality. No acute osseous findings. CT ABDOMEN PELVIS FINDINGS Hepatobiliary: No solid liver abnormality is seen. Tiny gallstones. No gallbladder wall thickening, or biliary dilatation. Pancreas: Unremarkable. No pancreatic ductal dilatation or surrounding inflammatory changes. Spleen: Normal in size without significant abnormality. Adrenals/Urinary Tract: Adrenal glands are unremarkable. Moderate bilateral hydronephrosis and hydroureter to the ureterovesicular junctions without calculus or other visible obstruction. Interval increase in extensive bilateral simple attenuation perinephric fluid (series 2, image 80). Severe wall thickening of the decompressed urinary bladder. Stomach/Bowel: Percutaneous gastrostomy. Appendix not clearly visualized. No evidence of bowel wall thickening, distention, or inflammatory changes. Sigmoid diverticulosis. Vascular/Lymphatic: Aortic atherosclerosis. No enlarged abdominal or pelvic  lymph nodes. Reproductive: No mass or other abnormality. Other: No abdominal wall hernia.  Anasarca.  No ascites. Musculoskeletal: No acute osseous findings. IMPRESSION: 1. Moderate bilateral hydronephrosis and hydroureter to the ureterovesicular junctions without calculus or other visible obstruction likely secondary to ureterovesicular junction due to bladder wall edema. 2. Interval increase in extensive bilateral simple attenuation perinephric fluid. 3. Pleural effusion similar to prior examination.  Anasarca. 4. Cholelithiasis. 5. Percutaneous gastrostomy. 6. Coronary artery disease. Aortic Atherosclerosis (ICD10-I70.0). Electronically Signed   By: Marolyn JONETTA Jaksch M.D.   On: 11/26/2023 19:16    Assessment/Plan: Robert Vega is a 83 y.o. male with chronic multiple medical problems admitted since May 10 and had a complicated hospital course as above.  Initially she was aspiration pneumonia due to severe esophageal dysmotility now status post botulinum toxin injection to the lower sphincter and PEG tube placement.  We are consulted for recurrent fever and persistent leukocytosis.  He has had blood cultures done and a pan scan chest abdomen pelvis with findings of moderate bilateral hydronephrosis and hydroureter and increase in extensive bilateral simple attenuation perinephric fluid.  Severe wall thickening of the decompressed bladder.SABRA  Started on cefepime Flagyl  and Vanco June 30. He does have a hemodialysis catheter to right chest wall but this appears without evidence of infection.  He has no urine output per renal note despite increased free water  intake.  Leukocytosis has been chronically elevated likely multifactorial.  Unclear source of his recurrent fever but could be current aspiration despite the PEG tube.  No evidence of intra-abdominal abscess.  7/3- cxs neg. I suspect chronic microaspiraton. No evidence sepsis or focal infection   Recommendations Change to zosyn  for likely aspiration Dc  vanco. Rec 7 day course of abx then stop and monitor for recurrent fevers or leukocytosis.   Thank you very much for the consult. Will follow with you.  Alm SHAUNNA Needle   11/28/2023, 2:29 PM

## 2023-11-28 NOTE — Progress Notes (Addendum)
 Progress Note   Patient: Robert Vega Robert Vega DOB: 1941-01-11 DOA: 10/05/2023     54 DOS: the patient was seen and examined on 11/28/2023   Brief hospital course:  HPI was taken from Dr. Eldonna: Robert Vega is a 83 y.o. male with medical history significant of hypertension, sleep apnea, obesity, GERD presenting with acute febrile hypoxia, sepsis  and pneumonia.  Patient noted to be overall poor historian.  Per report, patient with increased work of breathing generalized weakness over the past 12 to 24 hours.  Was initially evaluated urgent care however EMS had to be called.  No severe weakness.  Mild cough per report.  No chest pain or abdominal pain.  No reported nausea or vomiting.  Seen by EMS with noted fever 100.7 and route.  Hypoxic to the mid 80s on room air. Presented to the ER Tmax 22.1, heart rate 90s, respirations mid 20s, BP 80s to 130s.  Requiring 4 L nasal cannula to keep O2 sats greater than 92%.  White count 7.7, hemoglobin 12, platelets 216, lactate 2.2.  COVID flu RSV negative.  Creatinine 1.  Glucose 152.  Chest x-ray with bilateral lower lobe pneumonia.  Positive pulmonary vascular congestion.   As per Dr. Lenon: Robert Vega is a 83 y.o male with significant PMH of OSA, GERD, Obesity, HTN, Dysphagia - presented to the ED 10/05/2023 from with hypoxia, fever and generalized weakness. Dx sepsis/pneumonia, concern for aspiration    Of note, EGD 03/2022 with food in upper esophagus complicated by aspiration event, cardiac arrest with round of CPR, and post resuscitation EGD with concern for lack of peristalsis. Hx prior EGD 08/2020 with note of abnormal cricopharyngeus, decrease in motility in esophagus, and spastic LES    5/10: Admit to [facility] service with sepsis due to Aspiration Pneumonia.  Course complicated by Acute Respiratory Failure due to Aspiration of vomitus w/ cardiac arrest transfer to ICU and intubation.  5/11: flexible bronchoscopy  5/13: PEG tube,  +vomiting last night, failed SAT/SBT 5/14: extubated but with severe delirium 5/20:  Botulinum toxin injection into the lower esophageal sphincter by Dr. Jinny 5/21: esophagram showing diffusely distended esophagus with distal obstruction and severe dysmotility suggestive of achalasia. At that point patient was stabilized to point of pursuing SNF placement but then with complications of PEG tube placement resulted in return to OR 5/27 and tube feeds restarted 5/28 with zosyn  for peritonitis.  Had new melena 6/9 so underwent another EGD without acute bleeding seen.  Renal function declined and nephrology was consulted and without improvement with conservative management, he received dialysis access and started on HD 6/16 as this was in line with family goals of care.  Discharge will now be pending arrangement of facility and outpatient HD if he does not have renal recovery.  He remains in poor prognosis state with intermittently needing to hold tube feeds again for intolerance and needed blood transfusion 6/18 for anemia likely related more to the renal failure as no source of bleeding has been identified thus far. Mental status is oriented to self and location and able to follow simple commands but otherwise confused. PT/OT recommends SNF. Permcath placement scheduled for this am.     As per Dr. Trudy 6/28-11/26/23: Pt is very sick. Overnight, 06/30 pt spiked a low grade fever and was started on IV broad sprectrum abxs, IV flagyl , vanco, cefepime . CT chest/abd/pelvis ordered. Pt's granddaughter is requesting ID consult. Please consult ID in AM as per granddaughter's request. Pt is  full code and full scope of care despite pt having failure to thrive.     07/02 -called and updated patient's granddaughter Robert Vega who had questions about her grandfather's condition and treatment plan.  She verbalized understanding and agreement with plan.     07/03 -called and updated patient's granddaughter Robert Vega  on her grandfathers condition and treatment plan.  All questions and concerns were addressed.   Assessment and Plan:  Failure to thrive: secondary to all below. Continue w/ full code, full scope of care as per pt's granddaughter.    SIRS: started on night on 11/25/23. W/ leukocytosis, tachypnea, fever and unknown source.  Started on IV broad sprectrum abxs, IV flagyl , vanco, cefepime .  Patient had a CT scan of the chest/abdomen/pelvis which showed moderate bilateral hydronephrosis and hydroureter to the ureterovesicular junction without calculus or other visible obstruction likely secondary to ureterovesicular junction due to bladder wall edema. Interval increase in extensive bilateral simple attenuation perinephric fluid. Pleural effusion similar to prior examination.  Anasarca. Cholelithiasis. Percutaneous gastrostomy. Coronary artery disease. Patient noted to have worsening leukocytosis.  Appreciate ID input Recommends to obtain UA if possible and to continue broad-spectrum antibiotics. Straight cath was attempted and was unsuccessful. Blood cultures have been sterile for  3 days.       AKI:  Hyperkalemia: Managed by renal replacement therapy Secondary to ATN as per nephro.  On HD MWF.  Renal replacement therapy was discontinued  early b/c of hypotension & bradycardia. Nephro following and recs apprec        Possible ileus:  Abdomen remains distended Monitor closely    Anemia of chronic kidney disease likely secondary to CKD. S/p 2 units of pRBCs transfused so far.  Continue iron supplementation     Achalasia & dysphagia: w/ high aspiration risk.  S/p botulinum toxin injection on 5/20, several EGD and SLP evals.  Not safe for PO intake unless family wants to accept risk that he will aspirate again. Continue w/ tube feeds     Severe sepsis: resolved. Initially w/ aspiration pna. Later in hospital stay with development of  intra-abdominal infection secondary to PEG  dislodged.   Acute hypoxic & hypercapnic respiratory failure:  s/p intubation, ventilation & extubation.  Continue on supplemental oxygen and wean as tolerated     Acute on chronic diastolic dysfunction CHF  XR showed interstitial edema/CHF.  Fluid/volume management w/ HD      Delirium: vs mild cognitive impairment vs anoxic brain injury during critical illness in the ICU, requiring intubation & pressors. Continue w/ supportive care. Granddaughter asked to d/c prn antipsychotics. Continue w/ supportive care    GERD: continue on PPI    HTN: BP is on the low end of normal     Generalized weakness: PT/OT recs SNF   Obesity: BMI 32.8.   Moderate malnutrition Secondary to acute illness Continue tube feeds   Dislodged PEG tube (11/28/23) Patient noted to have dislodged PEG tube Imaging showed  It appears likely that the retention balloon has migrated distally. Recommend retraction of the gastrostomy tube until the retention balloon is felt at the level of the anterior gastric wall. Appreciate GI input.  PEG tube fixed     Subjective: Patient is seen and examined at the bedside. He is more awake and alert this morning and is responsive  Physical Exam: Vitals:   11/27/23 2038 11/27/23 2103 11/28/23 0427 11/28/23 0819  BP: (!) 117/57 (!) 136/56 119/65 (!) 123/57  Pulse: 80 87 81 82  Resp: Robert Vega)  26 16 17  (!) 24  Temp: 98 F (36.7 C)  97.9 F (36.6 C) 98.5 F (36.9 C)  TempSrc:    Oral  SpO2: 100% 93% 90% 94%  Weight: 96.9 kg     Height:       General exam: Lying in bed.  Content Respiratory system: decreased breath sounds b/l  Cardiovascular system: S1 & S2+. No rubs or clicks    Gastrointestinal system: abd is soft, tender RLQ, obese, hypoactive bowel sounds  Central nervous system:  Awake, alert Psychiatry: Unable to assess     Data Reviewed: White count 25.4, hemoglobin 7.2, sodium 132  Labs reviewed  Family Communication: Plan of care will be discussed with  patient's granddaughter  Disposition: Status is: Inpatient Remains inpatient appropriate because: On IV antibiotics  Planned Discharge Destination: TBD    Time spent: 40 minutes  Author: Aimee Somerset, MD 11/28/2023 1:09 PM  For on call review [email or web address].

## 2023-11-28 NOTE — Progress Notes (Addendum)
 Physical Therapy Treatment Patient Details Name: Robert Vega MRN: [id number] DOB: [birth year] Today's Date: 11/28/2023   History of Present Illness 83 y.o male with significant PMH of OSA, GERD, Obesity, HTN, Dysphagia: EGD 03/2022 with food in upper esophagus complicated by aspiration event, cardiac arrest with round of CPR, and post resuscitation EGD with concern for lack of peristalsis. Pt presented to ED on 10/05/2023 with hypoxia, fever and generalized weakness; developed acute respiratory failure requiring intubation 5/10 due to aspiration pneumonia, extubated 5/15, but had significant agitation required brief course of Precedex ; pt now s/p exploratory laparotomy with closure of gastrotomy and insertion of gastrostomy tube on 10/22/23.  PT order discontinued by MD 11/10/23 and new PT consult received 11/14/23.  Pt s/p R temporary femoral vein dialysis catheter placement 11/11/23; now removed; pt now with HD perm cath.    PT Comments  PT/OT co-session performed.  Pt laying in bed upon PT arrival; pt grunting and talking but therapist unable to understand most of what pt was saying during session (pt appearing to be mumbling most of the time); pt unable to state name; unable to follow any cues except pt raised his arm but unsure if spontaneous or due to tactile cue.  Pt noted to be incontinent of BM requiring assist for peri-care and complete bed linen change (total assist x2 for logrolling L/R in bed).  Pt more alert beginning of session but became more lethargic towards end of session limiting activity.  SpO2 sats on 2 L O2 via nasal cannula 92% at rest beginning/end of session and HR 83-88 bpm when checked intermittently during session.  Nurse updated on pt's status.  Will continue to focus on strengthening and progressive functional mobility during hospitalization.    If plan is discharge home, recommend the following: Two people to help with walking and/or transfers;Two people to help with  bathing/dressing/bathroom;Assistance with cooking/housework;Direct supervision/assist for medications management;Direct supervision/assist for financial management;Assist for transportation;Help with stairs or ramp for entrance;Supervision due to cognitive status   Can travel by private vehicle     No  Equipment Recommendations  Other (comment) (Defer to next level of care)    Recommendations for Other Services       Precautions / Restrictions Precautions Precautions: Fall Recall of Precautions/Restrictions: Impaired Precaution/Restrictions Comments: PEG; perm cath (HD) Restrictions Weight Bearing Restrictions Per Provider Order: No Other Position/Activity Restrictions: Safety mitts donned     Mobility  Bed Mobility Overal bed mobility: Needs Assistance Bed Mobility: Rolling Rolling: Total assist, +2 for physical assistance         General bed mobility comments: total assist x2 logrolling L/R in bed for peri-care and complete bed linen change d/t BM incontinence in bed; pt appearing to be resisting movement at times    Transfers                   General transfer comment: Not appropriate at this time    Ambulation/Gait                   Stairs             Wheelchair Mobility     Tilt Bed    Modified Rankin (Stroke Patients Only)       Balance  Communication Communication Communication: Impaired Factors Affecting Communication: Reduced clarity of speech  Cognition Arousal:  (Alert initially but became more lethargic during session) Behavior During Therapy: Impulsive, Restless   PT - Cognitive impairments: No family/caregiver present to determine baseline, Difficult to assess, Orientation, Awareness, Memory, Attention, Initiation, Sequencing, Problem solving, Safety/Judgement Difficult to assess due to: Impaired communication, Level of arousal (pt mumbling words and  grunting intermittently during session) Orientation impairments: Place, Time, Situation, Person                     Following commands: Impaired Following commands impaired: Follows one step commands inconsistently    Cueing Cueing Techniques: Verbal cues, Tactile cues, Visual cues  Exercises      General Comments  Nursing cleared pt for participation in physical therapy.      Pertinent Vitals/Pain Pain Assessment Pain Assessment: PAINAD Breathing: occasional labored breathing, short period of hyperventilation Negative Vocalization: repeated troubled calling out, loud moaning/groaning, crying Facial Expression: facial grimacing Body Language: tense, distressed pacing, fidgeting Consolability: distracted or reassured by voice/touch PAINAD Score: 7 Pain Location: generalized with bed mobility Pain Descriptors / Indicators: Grimacing, Guarding Pain Intervention(s): Limited activity within patient's tolerance, Monitored during session, Repositioned, Other (comment) (RN updated on pt's status)    Home Living                          Prior Function            PT Goals (current goals can now be found in the care plan section) Acute Rehab PT Goals Patient Stated Goal: none stated PT Goal Formulation: Patient unable to participate in goal setting Time For Goal Achievement: 12/10/23 Potential to Achieve Goals: Fair Progress towards PT goals: Not progressing toward goals - comment (limited per pt's cognition and ability to participate; also d/t BM incontinence)    Frequency    Min 2X/week      PT Plan      Co-evaluation PT/OT/SLP Co-Evaluation/Treatment: Yes Reason for Co-Treatment: Necessary to address cognition/behavior during functional activity;For patient/therapist safety;To address functional/ADL transfers PT goals addressed during session: Mobility/safety with mobility OT goals addressed during session: ADL's and self-care      AM-PAC PT 6  Clicks Mobility   Outcome Measure  Help needed turning from your back to your side while in a flat bed without using bedrails?: Total Help needed moving from lying on your back to sitting on the side of a flat bed without using bedrails?: Total Help needed moving to and from a bed to a chair (including a wheelchair)?: Total Help needed standing up from a chair using your arms (e.g., wheelchair or bedside chair)?: Total Help needed to walk in hospital room?: Total Help needed climbing 3-5 steps with a railing? : Total 6 Click Score: 6    End of Session Equipment Utilized During Treatment: Oxygen (2 L O2 via nasal cannula) Activity Tolerance: Patient tolerated treatment well;Patient limited by lethargy;Other (comment) (Limited per pt's ability to participate/follow cues) Patient left: in bed;with call bell/phone within reach;with bed alarm set;Other (comment);with safety mitts reapplied (B heels floating via pillow; B mitts in place) Nurse Communication: Mobility status;Precautions;Other (comment) (Pt's BM incontinence; pt's cognition) PT Visit Diagnosis: Other abnormalities of gait and mobility (R26.89);Muscle weakness (generalized) (M62.81) Pain - part of body:  (abdomen)     Time: 8953-8892 PT Time Calculation (min) (ACUTE ONLY): 21 min  Charges:    $Therapeutic Activity: 8-22 mins PT General  Charges $$ ACUTE PT VISIT: 1 Visit                     Damien Caulk, PT 11/28/23, 11:34 AM

## 2023-11-29 DIAGNOSIS — R6520 Severe sepsis without septic shock: Secondary | ICD-10-CM | POA: Diagnosis not present

## 2023-11-29 DIAGNOSIS — A419 Sepsis, unspecified organism: Secondary | ICD-10-CM | POA: Diagnosis not present

## 2023-11-29 LAB — CBC WITH DIFFERENTIAL/PLATELET
Abs Immature Granulocytes: 0.6 K/uL — ABNORMAL HIGH (ref 0.00–0.07)
Basophils Absolute: 0.1 K/uL (ref 0.0–0.1)
Basophils Relative: 0 %
Eosinophils Absolute: 0.1 K/uL (ref 0.0–0.5)
Eosinophils Relative: 0 %
HCT: 23.7 % — ABNORMAL LOW (ref 39.0–52.0)
Hemoglobin: 7.4 g/dL — ABNORMAL LOW (ref 13.0–17.0)
Immature Granulocytes: 2 %
Lymphocytes Relative: 5 %
Lymphs Abs: 1.1 K/uL (ref 0.7–4.0)
MCH: 29 pg (ref 26.0–34.0)
MCHC: 31.2 g/dL (ref 30.0–36.0)
MCV: 92.9 fL (ref 80.0–100.0)
Monocytes Absolute: 2.9 K/uL — ABNORMAL HIGH (ref 0.1–1.0)
Monocytes Relative: 12 %
Neutro Abs: 20.1 K/uL — ABNORMAL HIGH (ref 1.7–7.7)
Neutrophils Relative %: 81 %
Platelets: 441 K/uL — ABNORMAL HIGH (ref 150–400)
RBC: 2.55 MIL/uL — ABNORMAL LOW (ref 4.22–5.81)
RDW: 15.4 % (ref 11.5–15.5)
WBC: 24.9 K/uL — ABNORMAL HIGH (ref 4.0–10.5)
nRBC: 0 % (ref 0.0–0.2)

## 2023-11-29 LAB — RENAL FUNCTION PANEL
Albumin: 1.7 g/dL — ABNORMAL LOW (ref 3.5–5.0)
Anion gap: 15 (ref 5–15)
BUN: 94 mg/dL — ABNORMAL HIGH (ref 8–23)
CO2: 24 mmol/L (ref 22–32)
Calcium: 7.5 mg/dL — ABNORMAL LOW (ref 8.9–10.3)
Chloride: 92 mmol/L — ABNORMAL LOW (ref 98–111)
Creatinine, Ser: 6.74 mg/dL — ABNORMAL HIGH (ref 0.61–1.24)
GFR, Estimated: 8 mL/min — ABNORMAL LOW (ref >60)
Glucose, Bld: 123 mg/dL — ABNORMAL HIGH (ref 70–99)
Phosphorus: 5 mg/dL — ABNORMAL HIGH (ref 2.5–4.6)
Potassium: 4.2 mmol/L (ref 3.5–5.1)
Sodium: 131 mmol/L — ABNORMAL LOW (ref 135–145)

## 2023-11-29 LAB — BASIC METABOLIC PANEL WITH GFR
Anion gap: 13 (ref 5–15)
BUN: 96 mg/dL — ABNORMAL HIGH (ref 8–23)
CO2: 26 mmol/L (ref 22–32)
Calcium: 7.6 mg/dL — ABNORMAL LOW (ref 8.9–10.3)
Chloride: 94 mmol/L — ABNORMAL LOW (ref 98–111)
Creatinine, Ser: 6.65 mg/dL — ABNORMAL HIGH (ref 0.61–1.24)
GFR, Estimated: 8 mL/min — ABNORMAL LOW (ref >60)
Glucose, Bld: 125 mg/dL — ABNORMAL HIGH (ref 70–99)
Potassium: 4.3 mmol/L (ref 3.5–5.1)
Sodium: 133 mmol/L — ABNORMAL LOW (ref 135–145)

## 2023-11-29 LAB — GLUCOSE, CAPILLARY: Glucose-Capillary: 112 mg/dL — ABNORMAL HIGH (ref 70–99)

## 2023-11-29 MED ORDER — HEPARIN SODIUM (PORCINE) 1000 UNIT/ML IJ SOLN
INTRAMUSCULAR | Status: AC
  Filled 2023-11-29: qty 10

## 2023-11-29 MED ORDER — ALBUMIN HUMAN 25 % IV SOLN
INTRAVENOUS | Status: AC
  Filled 2023-11-29: qty 100

## 2023-11-29 NOTE — Progress Notes (Signed)
 Progress Note   Patient: Robert Vega FMW:[id number] DOB: 1940/12/09 DOA: 10/05/2023     55 DOS: the patient was seen and examined on 11/29/2023   Brief hospital course:  HPI was taken from Dr. Eldonna: Robert Vega is a 83 y.o. male with medical history significant of hypertension, sleep apnea, obesity, GERD presenting with acute febrile hypoxia, sepsis  and pneumonia.  Patient noted to be overall poor historian.  Per report, patient with increased work of breathing generalized weakness over the past 12 to 24 hours.  Was initially evaluated urgent care however EMS had to be called.  No severe weakness.  Mild cough per report.  No chest pain or abdominal pain.  No reported nausea or vomiting.  Seen by EMS with noted fever 100.7 and route.  Hypoxic to the mid 80s on room air. Presented to the ER Tmax 22.1, heart rate 90s, respirations mid 20s, BP 80s to 130s.  Requiring 4 L nasal cannula to keep O2 sats greater than 92%.  White count 7.7, hemoglobin 12, platelets 216, lactate 2.2.  COVID flu RSV negative.  Creatinine 1.  Glucose 152.  Chest x-ray with bilateral lower lobe pneumonia.  Positive pulmonary vascular congestion.   As per Dr. Lenon: Robert Vega is a 83 y.o male with significant PMH of OSA, GERD, Obesity, HTN, Dysphagia - presented to the ED 10/05/2023 from with hypoxia, fever and generalized weakness. Dx sepsis/pneumonia, concern for aspiration    Of note, EGD 03/2022 with food in upper esophagus complicated by aspiration event, cardiac arrest with round of CPR, and post resuscitation EGD with concern for lack of peristalsis. Hx prior EGD 08/2020 with note of abnormal cricopharyngeus, decrease in motility in esophagus, and spastic LES    5/10: Admit to [facility] service with sepsis due to Aspiration Pneumonia.  Course complicated by Acute Respiratory Failure due to Aspiration of vomitus w/ cardiac arrest transfer to ICU and intubation.  5/11: flexible bronchoscopy  5/13: PEG tube,  +vomiting last night, failed SAT/SBT 5/14: extubated but with severe delirium 5/20:  Botulinum toxin injection into the lower esophageal sphincter by Dr. Jinny 5/21: esophagram showing diffusely distended esophagus with distal obstruction and severe dysmotility suggestive of achalasia. At that point patient was stabilized to point of pursuing SNF placement but then with complications of PEG tube placement resulted in return to OR 5/27 and tube feeds restarted 5/28 with zosyn  for peritonitis.  Had new melena 6/9 so underwent another EGD without acute bleeding seen.  Renal function declined and nephrology was consulted and without improvement with conservative management, he received dialysis access and started on HD 6/16 as this was in line with family goals of care.  Discharge will now be pending arrangement of facility and outpatient HD if he does not have renal recovery.  He remains in poor prognosis state with intermittently needing to hold tube feeds again for intolerance and needed blood transfusion 6/18 for anemia likely related more to the renal failure as no source of bleeding has been identified thus far. Mental status is oriented to self and location and able to follow simple commands but otherwise confused. PT/OT recommends SNF. Permcath placement scheduled for this am.     As per Dr. Trudy 6/28-11/26/23: Pt is very sick. Overnight, 06/30 pt spiked a low grade fever and was started on IV broad sprectrum abxs, IV flagyl , vanco, cefepime . CT chest/abd/pelvis ordered. Pt's granddaughter is requesting ID consult. Please consult ID in AM as per granddaughter's request. Pt is  full code and full scope of care despite pt having failure to thrive.        07/02 -called and updated patient's granddaughter [name] who had questions about her grandfather's condition and treatment plan.  She verbalized understanding and agreement with plan.  07/03 - called and updated patient's granddaughter [name]  on her grandfathers condition and treatment plan.  All questions and concerns were addressed.    Assessment and Plan:  Failure to thrive: secondary to all below. Continue w/ full code, full scope of care as per pt's granddaughter.    SIRS: Suspect secondary to aspiration pneumonia started on night on 11/25/23. W/ leukocytosis, tachypnea, fever and unknown source.  Started initially on IV broad sprectrum abxs, IV flagyl , vanco, cefepime .  Patient had a CT scan of the chest/abdomen/pelvis which showed moderate bilateral hydronephrosis and hydroureter to the ureterovesicular junction without calculus or other visible obstruction likely secondary to ureterovesicular junction due to bladder wall edema. Interval increase in extensive bilateral simple attenuation perinephric fluid. Pleural effusion similar to prior examination.  Anasarca. Cholelithiasis. Percutaneous gastrostomy. Coronary artery disease. Patient noted to have worsening leukocytosis.  Appreciate ID input Recommends to obtain UA if possible and to continue broad-spectrum antibiotics. Straight cath was attempted and was unsuccessful. Recommends to treat patient with IV Zosyn  for suspected aspiration pneumonia for 7 days We will discontinue cefepime , Vanco and Flagyl  Blood cultures have been sterile for  3 days.       AKI:  Hyperkalemia: Managed by renal replacement therapy Secondary to ATN as per nephro.  On HD MWF.  Renal replacement therapy was discontinued  early b/c of hypotension & bradycardia. Nephro following and recs apprec        Possible ileus:  Abdomen remains distended Monitor closely     Anemia of chronic kidney disease likely secondary to CKD. S/p 2 units of pRBCs transfused so far.  Continue iron supplementation     Achalasia & dysphagia: w/ high aspiration risk.  S/p botulinum toxin injection on 5/20, several EGD and SLP evals.  Not safe for PO intake unless family wants to accept risk that he will  aspirate again. Continue w/ tube feeds      Severe sepsis: resolved. Initially w/ aspiration pna. Later in hospital stay with development of  intra-abdominal infection secondary to PEG dislodged.   Acute hypoxic & hypercapnic respiratory failure:  s/p intubation, ventilation & extubation.  Continue on supplemental oxygen and wean as tolerated     Acute on chronic diastolic dysfunction CHF  XR showed interstitial edema/CHF.  Fluid/volume management w/ HD      Delirium: vs mild cognitive impairment vs anoxic brain injury during critical illness in the ICU, requiring intubation & pressors. Continue w/ supportive care. Granddaughter asked to d/c prn antipsychotics. Continue w/ supportive care    GERD: continue on PPI    HTN: BP is on the low end of normal     Generalized weakness: PT/OT recs SNF   Obesity: BMI 32.8.   Moderate malnutrition Secondary to acute illness Continue tube feeds   Dislodged PEG tube (11/28/23) Patient noted to have dislodged PEG tube Imaging showed  It appears likely that the retention balloon has migrated distally. Recommend retraction of the gastrostomy tube until the retention balloon is felt at the level of the anterior gastric wall. Appreciate GI input.  PEG tube fixed          Subjective: No new complaints  Physical Exam: Vitals:   11/29/23 1100 11/29/23 1130 11/29/23  1230 11/29/23 1329  BP: 96/71 (!) 97/55 (!) 112/51 (!) 134/50  Pulse: 70 68 79 83  Resp: 19 (!) 23 (!) 25 (!) 22  Temp:   98.8 F (37.1 C) 98.7 F (37.1 C)  TempSrc:    Oral  SpO2: 100% 100% 100% 95%  Weight:   95.3 kg   Height:       General exam: Lying in bed.  Respiratory system: decreased breath sounds b/l  Cardiovascular system: S1 & S2+. No rubs or clicks    Gastrointestinal system: abd is soft, tender RLQ, obese, hypoactive bowel sounds  Central nervous system: Lethargic Psychiatry: Unable to assess     Data Reviewed: Sodium 133, BUN 96, creatinine  6.65, calcium  7.6, white count 24.9, hemoglobin 7.4 Labs reviewed  Family Communication: None  Disposition: Status is: Inpatient Remains inpatient appropriate because: On IV antibiotics for suspected aspiration pneumonia  Planned Discharge Destination: Skilled nursing facility    Time spent: 45 minutes  Author: Aimee Somerset, MD 11/29/2023 2:09 PM  For on call review [email or web address].

## 2023-11-29 NOTE — Plan of Care (Signed)

## 2023-11-29 NOTE — Plan of Care (Signed)
  Problem: Education: Goal: Knowledge of General Education information will improve Description: Including pain rating scale, medication(s)/side effects and non-pharmacologic comfort measures Outcome: Progressing   Problem: Health Behavior/Discharge Planning: Goal: Ability to manage health-related needs will improve Outcome: Progressing   Problem: Clinical Measurements: Goal: Ability to maintain clinical measurements within normal limits will improve Outcome: Progressing Goal: Will remain free from infection Outcome: Progressing Goal: Diagnostic test results will improve Outcome: Progressing Goal: Respiratory complications will improve Outcome: Progressing Goal: Cardiovascular complication will be avoided Outcome: Progressing   Problem: Activity: Goal: Risk for activity intolerance will decrease Outcome: Progressing   Problem: Elimination: Goal: Will not experience complications related to bowel motility Outcome: Progressing Goal: Will not experience complications related to urinary retention Outcome: Progressing   Problem: Pain Managment: Goal: General experience of comfort will improve and/or be controlled Outcome: Progressing   Problem: Safety: Goal: Ability to remain free from injury will improve Outcome: Progressing   Problem: Skin Integrity: Goal: Risk for impaired skin integrity will decrease Outcome: Progressing

## 2023-11-29 NOTE — Progress Notes (Signed)
 [facility]  ROUNDING NOTE   Subjective:   Seen and examined on hemodialysis treatment. Laying in bed. Minimally responsive.     HEMODIALYSIS FLOWSHEET:  Blood Flow Rate (mL/min): 400 mL/min Arterial Pressure (mmHg): -174.54 mmHg Venous Pressure (mmHg): 160.39 mmHg TMP (mmHg): -1.82 mmHg Ultrafiltration Rate (mL/min): 686 mL/min Dialysate Flow Rate (mL/min): 300 ml/min Dialysis Fluid Bolus: Normal Saline (bolus given due to low bp.) Bolus Amount (mL): 100 mL   Objective:  Vital signs in last 24 hours:  Temp:  [97.9 F (36.6 C)-98.6 F (37 C)] 98.4 F (36.9 C) (07/04 0830) Pulse Rate:  [65-87] 66 (07/04 0930) Resp:  [18-33] 24 (07/04 0930) BP: (100-147)/(50-72) 100/52 (07/04 0930) SpO2:  [99 %-100 %] 100 % (07/04 0930) Weight:  [96.8 kg] 96.8 kg (07/04 0830)  Weight change:  Filed Weights   11/27/23 1745 11/27/23 2038 11/29/23 0830  Weight: 97.9 kg 96.9 kg 96.8 kg    Intake/Output: I/O last 3 completed shifts: In: 48 [NG/GT:834; IV Piggyback:150] Out: 1000 [Other:1000]   Intake/Output this shift:  No intake/output data recorded.  Physical Exam: General: Ill appearing, laying in bed  Head: Oral mucosa moist  Eyes: Anicteric  Lungs:  Diminished  Heart: Regular rate and rhythm  Abdomen:  Peg tube in place, +distended  Extremities: Trace peripheral edema.  Neurologic: Alert and oriented to self  Skin: No lesions  Access: Rt internal jugular permcath    Basic Metabolic Panel: Recent Labs  Lab 11/23/23 0415 11/24/23 0404 11/26/23 0409 11/27/23 0452 11/28/23 0931 11/29/23 0825 11/29/23 0840  NA 132*   < > 134* 132* 132* 133* 131*  K 4.6   < > 4.9 5.2* 4.0 4.3 4.2  CL 95*   < > 96* 95* 94* 94* 92*  CO2 26   < > 24 23 24 26 24   GLUCOSE 120*   < > 137* 97 124* 125* 123*  BUN 56*   < > 72* 99* 72* 96* 94*  CREATININE 4.76*   < > 5.64* 7.41* 5.26* 6.65* 6.74*  CALCIUM  7.5*   < > 7.6* 7.8* 7.5* 7.6* 7.5*  MG  --   --   --  2.7*  --   --    --   PHOS 5.1*  --   --  7.3*  --   --  5.0*   < > = values in this interval not displayed.    Liver Function Tests: Recent Labs  Lab 11/23/23 0415 11/25/23 2158 11/29/23 0840  AST  --  19  --   ALT  --  13  --   ALKPHOS  --  77  --   BILITOT  --  0.2  --   PROT  --  6.7  --   ALBUMIN  1.5* 1.8* 1.7*   No results for input(s): LIPASE, AMYLASE in the last 168 hours. No results for input(s): AMMONIA in the last 168 hours.  CBC: Recent Labs  Lab 11/25/23 2158 11/26/23 0409 11/27/23 0452 11/28/23 0931 11/29/23 0825  WBC 20.9*  20.8* 21.0* 23.1* 25.4* 24.9*  NEUTROABS 17.0*  --   --  21.4* 20.1*  HGB 7.6*  7.5* 7.4* 7.4* 7.2* 7.4*  HCT 23.5*  23.3* 23.4* 23.7* 22.4* 23.7*  MCV 91.4  91.4 92.1 92.2 90.3 92.9  PLT 350  332 331 381 400 441*    Cardiac Enzymes: No results for input(s): CKTOTAL, CKMB, CKMBINDEX, TROPONINI in the last 168 hours.  BNP: Invalid input(s): POCBNP  CBG: Recent  Labs  Lab 11/27/23 1215 11/27/23 1616 11/27/23 2107 11/28/23 0820 11/28/23 1136  GLUCAP 138* 111* 118* 125* 110*    Microbiology: Results for orders placed or performed during the hospital encounter of 10/05/23  Blood Culture (routine x 2)     Status: None   Collection Time: 10/05/23  5:06 AM   Specimen: BLOOD  Result Value Ref Range Status   Specimen Description BLOOD LA  Final   Special Requests   Final    BOTTLES DRAWN AEROBIC AND ANAEROBIC Blood Culture results may not be optimal due to an inadequate volume of blood received in culture bottles   Culture   Final    NO GROWTH 5 DAYS Performed at [facility], [address] [place], [place] [address]    Report Status 10/10/2023 FINAL  Final  Blood Culture (routine x 2)     Status: None   Collection Time: 10/05/23  5:07 AM   Specimen: BLOOD  Result Value Ref Range Status   Specimen Description BLOOD RA  Final   Special Requests   Final    BOTTLES DRAWN AEROBIC AND ANAEROBIC Blood Culture  results may not be optimal due to an inadequate volume of blood received in culture bottles   Culture   Final    NO GROWTH 5 DAYS Performed at [facility], [address] [place], [place] [address]    Report Status 10/10/2023 FINAL  Final  Resp panel by RT-PCR (RSV, Flu A&B, Covid) Anterior Nasal Swab     Status: None   Collection Time: 10/05/23  5:56 AM   Specimen: Anterior Nasal Swab  Result Value Ref Range Status   SARS Coronavirus 2 by RT PCR NEGATIVE NEGATIVE Final    Comment: (NOTE) SARS-CoV-2 target nucleic acids are NOT DETECTED.  The SARS-CoV-2 RNA is generally detectable in upper respiratory specimens during the acute phase of infection. The lowest concentration of SARS-CoV-2 viral copies this assay can detect is 138 copies/mL. A negative result does not preclude SARS-Cov-2 infection and should not be used as the sole basis for treatment or other patient management decisions. A negative result may occur with  improper specimen collection/handling, submission of specimen other than nasopharyngeal swab, presence of viral mutation(s) within the areas targeted by this assay, and inadequate number of viral copies(<138 copies/mL). A negative result must be combined with clinical observations, patient history, and epidemiological information. The expected result is Negative.  Fact Sheet for Patients:  BloggerCourse.com  Fact Sheet for Healthcare Providers:  SeriousBroker.it  This test is no t yet approved or cleared by the [place]  FDA and  has been authorized for detection and/or diagnosis of SARS-CoV-2 by FDA under an Emergency Use Authorization (EUA). This EUA will remain  in effect (meaning this test can be used) for the duration of the COVID-19 declaration under Section 564(b)(1) of the Act, 21 U.S.C.section 360bbb-3(b)(1), unless the authorization is terminated  or revoked sooner.       Influenza A  by PCR NEGATIVE NEGATIVE Final   Influenza B by PCR NEGATIVE NEGATIVE Final    Comment: (NOTE) The Xpert Xpress SARS-CoV-2/FLU/RSV plus assay is intended as an aid in the diagnosis of influenza from Nasopharyngeal swab specimens and should not be used as a sole basis for treatment. Nasal washings and aspirates are unacceptable for Xpert Xpress SARS-CoV-2/FLU/RSV testing.  Fact Sheet for Patients: BloggerCourse.com  Fact Sheet for Healthcare Providers: SeriousBroker.it  This test is not yet approved or cleared by the [place]   FDA and has been authorized for detection and/or diagnosis of SARS-CoV-2 by FDA under an Emergency Use Authorization (EUA). This EUA will remain in effect (meaning this test can be used) for the duration of the COVID-19 declaration under Section 564(b)(1) of the Act, 21 U.S.C. section 360bbb-3(b)(1), unless the authorization is terminated or revoked.     Resp Syncytial Virus by PCR NEGATIVE NEGATIVE Final    Comment: (NOTE) Fact Sheet for Patients: BloggerCourse.com  Fact Sheet for Healthcare Providers: SeriousBroker.it  This test is not yet approved or cleared by the [place]  FDA and has been authorized for detection and/or diagnosis of SARS-CoV-2 by FDA under an Emergency Use Authorization (EUA). This EUA will remain in effect (meaning this test can be used) for the duration of the COVID-19 declaration under Section 564(b)(1) of the Act, 21 U.S.C. section 360bbb-3(b)(1), unless the authorization is terminated or revoked.  Performed at [facility], [address] [place], [place] [address]   Respiratory (~20 pathogens) panel by PCR     Status: None   Collection Time: 10/05/23  8:11 AM   Specimen: Nasopharyngeal Swab; Respiratory  Result Value Ref Range Status   Adenovirus NOT DETECTED NOT DETECTED Final   Coronavirus 229E NOT  DETECTED NOT DETECTED Final    Comment: (NOTE) The Coronavirus on the Respiratory Panel, DOES NOT test for the novel  Coronavirus (2019 nCoV)    Coronavirus HKU1 NOT DETECTED NOT DETECTED Final   Coronavirus NL63 NOT DETECTED NOT DETECTED Final   Coronavirus OC43 NOT DETECTED NOT DETECTED Final   Metapneumovirus NOT DETECTED NOT DETECTED Final   Rhinovirus / Enterovirus NOT DETECTED NOT DETECTED Final   Influenza A NOT DETECTED NOT DETECTED Final   Influenza B NOT DETECTED NOT DETECTED Final   Parainfluenza Virus 1 NOT DETECTED NOT DETECTED Final   Parainfluenza Virus 2 NOT DETECTED NOT DETECTED Final   Parainfluenza Virus 3 NOT DETECTED NOT DETECTED Final   Parainfluenza Virus 4 NOT DETECTED NOT DETECTED Final   Respiratory Syncytial Virus NOT DETECTED NOT DETECTED Final   Bordetella pertussis NOT DETECTED NOT DETECTED Final   Bordetella Parapertussis NOT DETECTED NOT DETECTED Final   Chlamydophila pneumoniae NOT DETECTED NOT DETECTED Final   Mycoplasma pneumoniae NOT DETECTED NOT DETECTED Final    Comment: Performed at [facility], [address], [place], [place] [address]  Expectorated Sputum Assessment w Gram Stain, Rflx to Resp Cult     Status: None   Collection Time: 10/05/23  9:07 AM   Specimen: Sputum  Result Value Ref Range Status   Specimen Description SPUTUM  Final   Special Requests NONE  Final   Sputum evaluation   Final    Sputum specimen not acceptable for testing.  Please recollect.   C/KERRY NELSON AT 1005 10/05/23.PMF Performed at [facility], [address] [place], [place] [address]    Report Status 10/05/2023 FINAL  Final  Expectorated Sputum Assessment w Gram Stain, Rflx to Resp Cult     Status: None   Collection Time: 10/05/23 10:50 AM  Result Value Ref Range Status   Specimen Description EXPECTORATED SPUTUM  Final   Special Requests NONE  Final   Sputum evaluation   Final    THIS SPECIMEN IS ACCEPTABLE FOR SPUTUM CULTURE Performed at  [facility], [address], [place], [place] [address]    Report Status 10/05/2023 FINAL  Final  Culture, Respiratory w Gram Stain     Status: None   Collection  Time: 10/05/23 10:50 AM  Result Value Ref Range Status   Specimen Description   Final    EXPECTORATED SPUTUM Performed at [facility], [address] [place], [place] [address]    Special Requests   Final    NONE Reflexed from [id number] Performed at [facility], [address] [place], [place] [address]    Gram Stain   Final    RARE WBC SEEN RARE VONNE POSITIVE RODS RARE VONNE POSITIVE COCCI RARE GRAM NEGATIVE RODS    Culture   Final    FEW Normal respiratory flora-no Staph aureus or Pseudomonas seen Performed at [facility], [address] [place], [place] [address]    Report Status 10/07/2023 FINAL  Final  MRSA Next Gen by PCR, Nasal     Status: None   Collection Time: 10/06/23 12:57 AM   Specimen: Nasal Mucosa; Nasal Swab  Result Value Ref Range Status   MRSA by PCR Next Gen NOT DETECTED NOT DETECTED Final    Comment: (NOTE) The GeneXpert MRSA Assay (FDA approved for NASAL specimens only), is one component of a comprehensive MRSA colonization surveillance program. It is not intended to diagnose MRSA infection nor to guide or monitor treatment for MRSA infections. Test performance is not FDA approved in patients less than 82 years old. Performed at [facility], [address] [place], [place] [address]   Culture, Respiratory w Gram Stain     Status: None   Collection Time: 10/06/23 11:37 AM   Specimen: INDUCED SPUTUM  Result Value Ref Range Status   Specimen Description   Final    INDUCED SPUTUM Performed at [facility], [address], [place], [place] [address]    Special Requests   Final    NONE Performed at [facility], [address] [place], [place] [address]    Gram Stain   Final    FEW WBC PRESENT,BOTH PMN AND  MONONUCLEAR FEW GRAM POSITIVE RODS    Culture   Final    MODERATE LACTOBACILLUS FERMENTUM Standardized susceptibility testing for this organism is not available. Performed at [facility], [address], [place], [place] [address]    Report Status 10/09/2023 FINAL  Final  Culture, blood (x 2)     Status: None   Collection Time: 10/22/23  1:00 AM   Specimen: BLOOD  Result Value Ref Range Status   Specimen Description BLOOD BLOOD RIGHT ARM  Final   Special Requests   Final    BOTTLES DRAWN AEROBIC AND ANAEROBIC Blood Culture adequate volume   Culture   Final    NO GROWTH 5 DAYS Performed at [facility], [address], [place], [place] [address]    Report Status 10/27/2023 FINAL  Final  Culture, blood (x 2)     Status: None   Collection Time: 10/22/23  1:00 AM   Specimen: BLOOD  Result Value Ref Range Status   Specimen Description BLOOD BLOOD RIGHT ARM  Final   Special Requests   Final    BOTTLES DRAWN AEROBIC AND ANAEROBIC Blood Culture adequate volume   Culture   Final    NO GROWTH 5 DAYS Performed at [facility], [address] [place], [place] [address]    Report Status 10/27/2023 FINAL  Final  Resp panel by RT-PCR (RSV, Flu A&B, Covid) Anterior Nasal Swab     Status: None   Collection Time: 11/09/23  5:31 PM   Specimen: Anterior Nasal  Swab  Result Value Ref Range Status   SARS Coronavirus 2 by RT PCR NEGATIVE NEGATIVE Final    Comment: (NOTE) SARS-CoV-2 target nucleic acids are NOT DETECTED.  The SARS-CoV-2 RNA is generally detectable in upper respiratory specimens during the acute phase of infection. The lowest concentration of SARS-CoV-2 viral copies this assay can detect is 138 copies/mL. A negative result does not preclude SARS-Cov-2 infection and should not be used as the sole basis for treatment or other patient management decisions. A negative result may occur with  improper specimen collection/handling, submission of specimen  other than nasopharyngeal swab, presence of viral mutation(s) within the areas targeted by this assay, and inadequate number of viral copies(<138 copies/mL). A negative result must be combined with clinical observations, patient history, and epidemiological information. The expected result is Negative.  Fact Sheet for Patients:  BloggerCourse.com  Fact Sheet for Healthcare Providers:  SeriousBroker.it  This test is no t yet approved or cleared by the [place]  FDA and  has been authorized for detection and/or diagnosis of SARS-CoV-2 by FDA under an Emergency Use Authorization (EUA). This EUA will remain  in effect (meaning this test can be used) for the duration of the COVID-19 declaration under Section 564(b)(1) of the Act, 21 U.S.C.section 360bbb-3(b)(1), unless the authorization is terminated  or revoked sooner.       Influenza A by PCR NEGATIVE NEGATIVE Final   Influenza B by PCR NEGATIVE NEGATIVE Final    Comment: (NOTE) The Xpert Xpress SARS-CoV-2/FLU/RSV plus assay is intended as an aid in the diagnosis of influenza from Nasopharyngeal swab specimens and should not be used as a sole basis for treatment. Nasal washings and aspirates are unacceptable for Xpert Xpress SARS-CoV-2/FLU/RSV testing.  Fact Sheet for Patients: BloggerCourse.com  Fact Sheet for Healthcare Providers: SeriousBroker.it  This test is not yet approved or cleared by the [place]  FDA and has been authorized for detection and/or diagnosis of SARS-CoV-2 by FDA under an Emergency Use Authorization (EUA). This EUA will remain in effect (meaning this test can be used) for the duration of the COVID-19 declaration under Section 564(b)(1) of the Act, 21 U.S.C. section 360bbb-3(b)(1), unless the authorization is terminated or revoked.     Resp Syncytial Virus by PCR NEGATIVE NEGATIVE Final     Comment: (NOTE) Fact Sheet for Patients: BloggerCourse.com  Fact Sheet for Healthcare Providers: SeriousBroker.it  This test is not yet approved or cleared by the [place]  FDA and has been authorized for detection and/or diagnosis of SARS-CoV-2 by FDA under an Emergency Use Authorization (EUA). This EUA will remain in effect (meaning this test can be used) for the duration of the COVID-19 declaration under Section 564(b)(1) of the Act, 21 U.S.C. section 360bbb-3(b)(1), unless the authorization is terminated or revoked.  Performed at [facility], [address] [place], [place] [address]   Respiratory (~20 pathogens) panel by PCR     Status: None   Collection Time: 11/09/23  5:31 PM   Specimen: Nasopharyngeal Swab; Respiratory  Result Value Ref Range Status   Adenovirus NOT DETECTED NOT DETECTED Final   Coronavirus 229E NOT DETECTED NOT DETECTED Final    Comment: (NOTE) The Coronavirus on the Respiratory Panel, DOES NOT test for the novel  Coronavirus (2019 nCoV)    Coronavirus HKU1 NOT DETECTED NOT DETECTED Final   Coronavirus NL63 NOT DETECTED NOT DETECTED Final   Coronavirus OC43 NOT DETECTED NOT DETECTED Final   Metapneumovirus NOT DETECTED NOT DETECTED Final   Rhinovirus /  Enterovirus NOT DETECTED NOT DETECTED Final   Influenza A NOT DETECTED NOT DETECTED Final   Influenza B NOT DETECTED NOT DETECTED Final   Parainfluenza Virus 1 NOT DETECTED NOT DETECTED Final   Parainfluenza Virus 2 NOT DETECTED NOT DETECTED Final   Parainfluenza Virus 3 NOT DETECTED NOT DETECTED Final   Parainfluenza Virus 4 NOT DETECTED NOT DETECTED Final   Respiratory Syncytial Virus NOT DETECTED NOT DETECTED Final   Bordetella pertussis NOT DETECTED NOT DETECTED Final   Bordetella Parapertussis NOT DETECTED NOT DETECTED Final   Chlamydophila pneumoniae NOT DETECTED NOT DETECTED Final   Mycoplasma pneumoniae NOT DETECTED NOT DETECTED  Final    Comment: Performed at [facility], [address], [place], [place] [address]  Culture, blood (Routine X 2) w Reflex to ID Panel     Status: None   Collection Time: 11/09/23  6:16 PM   Specimen: BLOOD  Result Value Ref Range Status   Specimen Description   Final    BLOOD RIGHT ANTECUBITAL Performed at [facility], [address] [place], [place] [address]    Special Requests   Final    BOTTLES DRAWN AEROBIC ONLY Blood Culture adequate volume Performed at [facility], [address], [place], [place] [address]    Culture   Final    NO GROWTH 11 DAYS Performed at [facility], [address] [place], [place] [address]    Report Status 11/20/2023 FINAL  Final  Culture, blood (Routine X 2) w Reflex to ID Panel     Status: None   Collection Time: 11/09/23  6:23 PM   Specimen: BLOOD  Result Value Ref Range Status   Specimen Description   Final    BLOOD BLOOD LEFT FOREARM Performed at [facility], [address], [place], [place] [address]    Special Requests   Final    BOTTLES DRAWN AEROBIC AND ANAEROBIC Blood Culture adequate volume Performed at [facility], [address], [place], [place] [address]    Culture   Final    NO GROWTH 11 DAYS Performed at [facility], [address], [place], [place] [address]    Report Status 11/20/2023 FINAL  Final  Urine Culture     Status: Abnormal   Collection Time: 11/10/23  1:58 AM   Specimen: Urine, Random  Result Value Ref Range Status   Specimen Description   Final    URINE, RANDOM Performed at [facility], [address] [place], [place] [address]    Special Requests   Final    NONE Reflexed from [id number] Performed at [facility], [address] [place], [place] [address]    Culture 60,000 COLONIES/mL ENTEROCOCCUS FAECALIS (A)  Final   Report Status 11/12/2023 FINAL  Final   Organism ID, Bacteria ENTEROCOCCUS FAECALIS (A)  Final       Susceptibility   Enterococcus faecalis - MIC*    AMPICILLIN  <=2 SENSITIVE Sensitive     NITROFURANTOIN <=16 SENSITIVE Sensitive     VANCOMYCIN  1 SENSITIVE Sensitive     * 60,000 COLONIES/mL ENTEROCOCCUS FAECALIS  Culture, blood (x 2)     Status: None (Preliminary result)   Collection Time: 11/25/23 10:31 PM   Specimen: BLOOD  Result Value Ref Range Status   Specimen Description BLOOD BLOOD LEFT ARM  Final   Special Requests   Final    BOTTLES DRAWN AEROBIC AND ANAEROBIC Blood Culture adequate volume   Culture  Final    NO GROWTH 4 DAYS Performed at [facility], [address] [place], [place] [address]    Report Status PENDING  Incomplete  Culture, blood (x 2)     Status: None (Preliminary result)   Collection Time: 11/25/23 10:31 PM   Specimen: BLOOD  Result Value Ref Range Status   Specimen Description BLOOD BLOOD RIGHT ARM  Final   Special Requests   Final    BOTTLES DRAWN AEROBIC AND ANAEROBIC Blood Culture adequate volume   Culture   Final    NO GROWTH 4 DAYS Performed at [facility], [address] [place], [place] [address]    Report Status PENDING  Incomplete    Coagulation Studies: No results for input(s): LABPROT, INR in the last 72 hours.   Urinalysis: No results for input(s): COLORURINE, LABSPEC, PHURINE, GLUCOSEU, HGBUR, BILIRUBINUR, KETONESUR, PROTEINUR, UROBILINOGEN, NITRITE, LEUKOCYTESUR in the last 72 hours.  Invalid input(s): APPERANCEUR     Imaging: DG ABDOMEN PEG TUBE LOCATION Result Date: 11/28/2023 CLINICAL DATA:  Check of gastrostomy tube positioning after injection of contrast. EXAM: ABDOMEN - 1 VIEW COMPARISON:  CT of abdomen on 11/26/2023 FINDINGS: Contrast was injected via a pre-existing gastrostomy tube. Contrast is present within the stomach and duodenum at the time of the radiograph. A retention balloon is present at the level of the distal stomach/proximal duodenum as seen on the CT. The  tubing of the gastrostomy tube itself is difficult to visualize on the x-ray. It appears likely that the retention balloon has migrated distally. IMPRESSION: Contrast injected via the gastrostomy tube is present within the stomach and duodenum at the time of the radiograph. A retention balloon is present at the level of the distal stomach/proximal duodenum as seen on the CT. The tubing of the gastrostomy tube itself is difficult to visualize on the x-ray. It appears likely that the retention balloon has migrated distally. Recommend retraction of the gastrostomy tube until the retention balloon is felt at the level of the anterior gastric wall. Electronically Signed   By: Marcey Moan M.D.   On: 11/28/2023 09:26        Medications:    albumin  human 25 g (11/27/23 1841)   piperacillin -tazobactam (ZOSYN )  IV 2.25 g (11/29/23 0546)    sodium chloride    Intravenous Once   Chlorhexidine  Gluconate Cloth  6 each Topical Q0600   epoetin  alfa-epbx (RETACRIT ) injection  10,000 Units Intravenous Q M,W,F-HD   feeding supplement (NEPRO CARB STEADY)  237 mL Per Tube 5 X Daily   feeding supplement (PROSource TF20)  60 mL Per Tube Daily   ferrous sulfate   325 mg Per Tube Daily   free water   60 mL Per Tube 5 X Daily   gentamicin  ointment   Topical TID   heparin  injection (subcutaneous)  5,000 Units Subcutaneous Q8H   insulin  aspart  10 Units Subcutaneous Once   lactobacillus  1 g Per Tube TID WC   midodrine   5 mg Per Tube TID WC   multivitamin  1 tablet Per Tube QHS   pantoprazole  (PROTONIX ) IV  40 mg Intravenous Q12H   polyethylene glycol  17 g Per Tube Daily   acetaminophen  **OR** acetaminophen , albumin  human, artificial tears, bisacodyl , ipratropium-albuterol , [DISCONTINUED] ondansetron  **OR** ondansetron  (ZOFRAN ) IV, mouth rinse  Assessment/ Plan:  Mr. Robert Vega is a 83 y.o.  male with obstructive sleep apnea, GERD, obesity, hypertension, dysphagia, severe esophageal dysmotility with  achalasia status post PEG tube placement, recent aspiration pneumonia  acute respiratory failure status post extubation 10/09/2023, who was admitted to [facility] on 10/05/2023 for Aspiration into respiratory tract, initial encounter [T17.908A] Severe sepsis (HCC) [A41.9, R65.20] History of dysphagia [Z87.898] Fever, unspecified fever cause [R50.9] Multifocal pneumonia [J18.9]   1.  Acute kidney injury. Requiring hemodialysis. With obstructive uropathy Baseline creatinine 0.81 from 10/14/2023.  Acute kidney injury secondary to ATN, obstructive uropathy, and progression of prerenal azotemia.   Patient is critically ill.  He was started on urgent hemodialysis for severe hyperkalemia and uremia. - Vascular surgery placed PermCath on 6/26.  - TOC dialysis coordinator notified of need for outpatient dialysis clinic.  Search in progress. - Patient seen and examined on hemodialysis treatment. Patient to continue MWF schedule.  - will discuss with family. Overall prognosis is poor   2.  Sepsis and hypotension due to aspiration pneumonia, found to have distal esophageal obstruction with severe dysmotility.  He is status post botulinum toxin injection to the lower esophageal sphincter.  PEG tube placed this admission. Receiving continuous tube feeds with scheduled free water  flushes.  - continue pip/tazo - Continue midodrine   4.Anemia in the setting of renal failure Lab Results  Component Value Date   HGB 7.4 (L) 11/29/2023  Hemoglobin down a bit further to 7.2.  Maintain the patient on Epogen  10,000 units IV with dialysis treatments.  5.  Bilateral hydronephrosis Noted on CT.Urology team has evaluated the patient and it is thought to be secondary to reflux.  Due to health, further intervention deferred at this time.     LOS: 53 Robert Vega 7/4/202510:16 AM

## 2023-11-29 NOTE — Progress Notes (Signed)
 SLP Cancellation Note  Patient Details Name: Robert Vega MRN: [id number] DOB: 1940/12/02   Cancelled treatment:       Reason Eval/Treat Not Completed:  (consulted w/ MD re: pt's status; asked if any further ST needs.)   MD reached out yesterday re: this pt. No further today.  Per chart notes, ID was contacted also and per chart note: pt w/ chronic, multiple medical problems admitted since May 10 and had a complicated hospital course. Initially, aspiration pneumonia due to Severe Esophageal Dysmotility (chronic, known) now status post botulinum toxin injection to the lower sphincter and PEG tube placement; recurrent fevers. Leukocytosis has been chronically elevated likely multifactorial. Unclear source of his recurrent fever but could be current aspiration despite the PEG tube. No evidence of intra-abdominal abscess. 7/3- cxs neg. I suspect chronic microaspiraton. No evidence sepsis or focal infection..   Pt's engagement has waxed/waned during this Lengthy hospitalization/illness of ~55 days. His Mental status is oriented to self and location and able to follow simple commands but otherwise confused, per chart notes. His Head CT in 09/2023 revealed: Chronic microvascular ischemic disease and generalized volume loss per MRI.   Pt has a Chronic PEG placement now d/t the Severe Esophageal phase Dysmotility. If any po's to be given, that would be the recommendation by GI. Strongly suspect impact on his Cognition and mental status from his lengthy illness/hospitalization(fevers, infection) and would recommend f/u at his next venue of care for Cognitive assessment/tx needs- when Acuity of illness has resolved (unsure of pt's Baseline Cognitive functioning). Recommend reducing distractions during engagement and use simple, direct questions/commands.  No further Acute ST services indicated. Recommend f/u at next venue of care. MD agreed.      Comer Portugal, MS, CCC-SLP Speech Language  Pathologist Rehab Services; [facility] [phone] (ascom) Diasha Castleman 11/29/2023, 1:48 PM

## 2023-11-30 DIAGNOSIS — R6520 Severe sepsis without septic shock: Secondary | ICD-10-CM | POA: Diagnosis not present

## 2023-11-30 DIAGNOSIS — A419 Sepsis, unspecified organism: Secondary | ICD-10-CM | POA: Diagnosis not present

## 2023-11-30 LAB — GASTROINTESTINAL PANEL BY PCR, STOOL (REPLACES STOOL CULTURE)

## 2023-11-30 LAB — GLUCOSE, CAPILLARY
Glucose-Capillary: 140 mg/dL — ABNORMAL HIGH (ref 70–99)
Glucose-Capillary: 118 mg/dL — ABNORMAL HIGH (ref 70–99)
Glucose-Capillary: 113 mg/dL — ABNORMAL HIGH (ref 70–99)
Glucose-Capillary: 137 mg/dL — ABNORMAL HIGH (ref 70–99)

## 2023-11-30 LAB — BASIC METABOLIC PANEL WITH GFR
Anion gap: 16 — ABNORMAL HIGH (ref 5–15)
BUN: 60 mg/dL — ABNORMAL HIGH (ref 8–23)
CO2: 26 mmol/L (ref 22–32)
Calcium: 7.6 mg/dL — ABNORMAL LOW (ref 8.9–10.3)
Chloride: 93 mmol/L — ABNORMAL LOW (ref 98–111)
Creatinine, Ser: 4.5 mg/dL — ABNORMAL HIGH (ref 0.61–1.24)
GFR, Estimated: 12 mL/min — ABNORMAL LOW (ref >60)
Glucose, Bld: 132 mg/dL — ABNORMAL HIGH (ref 70–99)
Potassium: 4.3 mmol/L (ref 3.5–5.1)
Sodium: 135 mmol/L (ref 135–145)

## 2023-11-30 LAB — CBC
HCT: 23 % — ABNORMAL LOW (ref 39.0–52.0)
Hemoglobin: 7.2 g/dL — ABNORMAL LOW (ref 13.0–17.0)
MCH: 28.8 pg (ref 26.0–34.0)
MCHC: 31.3 g/dL (ref 30.0–36.0)
MCV: 92 fL (ref 80.0–100.0)
Platelets: 401 K/uL — ABNORMAL HIGH (ref 150–400)
RBC: 2.5 MIL/uL — ABNORMAL LOW (ref 4.22–5.81)
RDW: 15.4 % (ref 11.5–15.5)
WBC: 20.3 K/uL — ABNORMAL HIGH (ref 4.0–10.5)
nRBC: 0.1 % (ref 0.0–0.2)

## 2023-11-30 LAB — CULTURE, BLOOD (ROUTINE X 2)
Culture: NO GROWTH
Culture: NO GROWTH
Special Requests: ADEQUATE
Special Requests: ADEQUATE

## 2023-11-30 NOTE — Plan of Care (Signed)

## 2023-11-30 NOTE — Progress Notes (Signed)
 [facility]  ROUNDING NOTE   Subjective:   Hemodialsysis treatment yesterday. Tolerated treatment well.    Objective:  Vital signs in last 24 hours:  Temp:  [98 F (36.7 C)-99.2 F (37.3 C)] 98 F (36.7 C) (07/05 0805) Pulse Rate:  [70-83] 70 (07/05 0805) Resp:  [16-26] 16 (07/05 0805) BP: (112-134)/(50-67) 113/55 (07/05 0805) SpO2:  [90 %-100 %] 95 % (07/05 0805) Weight:  [95.3 kg] 95.3 kg (07/04 1230)  Weight change:  Filed Weights   11/27/23 2038 11/29/23 0830 11/29/23 1230  Weight: 96.9 kg 96.8 kg 95.3 kg    Intake/Output: I/O last 3 completed shifts: In: 510 [NG/GT:360; IV Piggyback:150] Out: 1.5 [Other:1.5]   Intake/Output this shift:  No intake/output data recorded.  Physical Exam: General: Ill appearing, laying in bed  Head: Oral mucosa moist  Eyes: Anicteric  Lungs:  Diminished  Heart: Regular rate and rhythm  Abdomen:  Peg tube in place, +distended  Extremities: no peripheral edema.  Neurologic: Alert and oriented to self  Skin: No lesions  Access: Rt internal jugular permcath    Basic Metabolic Panel: Recent Labs  Lab 11/27/23 0452 11/28/23 0931 11/29/23 0825 11/29/23 0840 11/30/23 0911  NA 132* 132* 133* 131* 135  K 5.2* 4.0 4.3 4.2 4.3  CL 95* 94* 94* 92* 93*  CO2 23 24 26 24 26   GLUCOSE 97 124* 125* 123* 132*  BUN 99* 72* 96* 94* 60*  CREATININE 7.41* 5.26* 6.65* 6.74* 4.50*  CALCIUM  7.8* 7.5* 7.6* 7.5* 7.6*  MG 2.7*  --   --   --   --   PHOS 7.3*  --   --  5.0*  --     Liver Function Tests: Recent Labs  Lab 11/25/23 2158 11/29/23 0840  AST 19  --   ALT 13  --   ALKPHOS 77  --   BILITOT 0.2  --   PROT 6.7  --   ALBUMIN  1.8* 1.7*   No results for input(s): LIPASE, AMYLASE in the last 168 hours. No results for input(s): AMMONIA in the last 168 hours.  CBC: Recent Labs  Lab 11/25/23 2158 11/26/23 0409 11/27/23 0452 11/28/23 0931 11/29/23 0825 11/30/23 0911  WBC 20.9*  20.8* 21.0* 23.1* 25.4*  24.9* 20.3*  NEUTROABS 17.0*  --   --  21.4* 20.1*  --   HGB 7.6*  7.5* 7.4* 7.4* 7.2* 7.4* 7.2*  HCT 23.5*  23.3* 23.4* 23.7* 22.4* 23.7* 23.0*  MCV 91.4  91.4 92.1 92.2 90.3 92.9 92.0  PLT 350  332 331 381 400 441* 401*    Cardiac Enzymes: No results for input(s): CKTOTAL, CKMB, CKMBINDEX, TROPONINI in the last 168 hours.  BNP: Invalid input(s): POCBNP  CBG: Recent Labs  Lab 11/27/23 2107 11/28/23 0820 11/28/23 1136 11/29/23 1603 11/30/23 0806  GLUCAP 118* 125* 110* 112* 140*    Microbiology: Results for orders placed or performed during the hospital encounter of 10/05/23  Blood Culture (routine x 2)     Status: None   Collection Time: 10/05/23  5:06 AM   Specimen: BLOOD  Result Value Ref Range Status   Specimen Description BLOOD LA  Final   Special Requests   Final    BOTTLES DRAWN AEROBIC AND ANAEROBIC Blood Culture results may not be optimal due to an inadequate volume of blood received in culture bottles   Culture   Final    NO GROWTH 5 DAYS Performed at [facility], [address], [place], [place]  [address]    Report Status 10/10/2023 FINAL  Final  Blood Culture (routine x 2)     Status: None   Collection Time: 10/05/23  5:07 AM   Specimen: BLOOD  Result Value Ref Range Status   Specimen Description BLOOD RA  Final   Special Requests   Final    BOTTLES DRAWN AEROBIC AND ANAEROBIC Blood Culture results may not be optimal due to an inadequate volume of blood received in culture bottles   Culture   Final    NO GROWTH 5 DAYS Performed at [facility], [address] [place], [place] [address]    Report Status 10/10/2023 FINAL  Final  Resp panel by RT-PCR (RSV, Flu A&B, Covid) Anterior Nasal Swab     Status: None   Collection Time: 10/05/23  5:56 AM   Specimen: Anterior Nasal Swab  Result Value Ref Range Status   SARS Coronavirus 2 by RT PCR NEGATIVE NEGATIVE Final    Comment: (NOTE) SARS-CoV-2 target nucleic acids are  NOT DETECTED.  The SARS-CoV-2 RNA is generally detectable in upper respiratory specimens during the acute phase of infection. The lowest concentration of SARS-CoV-2 viral copies this assay can detect is 138 copies/mL. A negative result does not preclude SARS-Cov-2 infection and should not be used as the sole basis for treatment or other patient management decisions. A negative result may occur with  improper specimen collection/handling, submission of specimen other than nasopharyngeal swab, presence of viral mutation(s) within the areas targeted by this assay, and inadequate number of viral copies(<138 copies/mL). A negative result must be combined with clinical observations, patient history, and epidemiological information. The expected result is Negative.  Fact Sheet for Patients:  BloggerCourse.com  Fact Sheet for Healthcare Providers:  SeriousBroker.it  This test is no t yet approved or cleared by the [place]  FDA and  has been authorized for detection and/or diagnosis of SARS-CoV-2 by FDA under an Emergency Use Authorization (EUA). This EUA will remain  in effect (meaning this test can be used) for the duration of the COVID-19 declaration under Section 564(b)(1) of the Act, 21 U.S.C.section 360bbb-3(b)(1), unless the authorization is terminated  or revoked sooner.       Influenza A by PCR NEGATIVE NEGATIVE Final   Influenza B by PCR NEGATIVE NEGATIVE Final    Comment: (NOTE) The Xpert Xpress SARS-CoV-2/FLU/RSV plus assay is intended as an aid in the diagnosis of influenza from Nasopharyngeal swab specimens and should not be used as a sole basis for treatment. Nasal washings and aspirates are unacceptable for Xpert Xpress SARS-CoV-2/FLU/RSV testing.  Fact Sheet for Patients: BloggerCourse.com  Fact Sheet for Healthcare Providers: SeriousBroker.it  This test is not  yet approved or cleared by the [place]  FDA and has been authorized for detection and/or diagnosis of SARS-CoV-2 by FDA under an Emergency Use Authorization (EUA). This EUA will remain in effect (meaning this test can be used) for the duration of the COVID-19 declaration under Section 564(b)(1) of the Act, 21 U.S.C. section 360bbb-3(b)(1), unless the authorization is terminated or revoked.     Resp Syncytial Virus by PCR NEGATIVE NEGATIVE Final    Comment: (NOTE) Fact Sheet for Patients: BloggerCourse.com  Fact Sheet for Healthcare Providers: SeriousBroker.it  This test is not yet approved or cleared by the [place]  FDA and has been authorized for detection and/or diagnosis of SARS-CoV-2 by FDA under an Emergency Use Authorization (EUA). This EUA will remain in effect (meaning this test can be used) for the duration  of the COVID-19 declaration under Section 564(b)(1) of the Act, 21 U.S.C. section 360bbb-3(b)(1), unless the authorization is terminated or revoked.  Performed at [facility], [address] [place], [place] [address]   Respiratory (~20 pathogens) panel by PCR     Status: None   Collection Time: 10/05/23  8:11 AM   Specimen: Nasopharyngeal Swab; Respiratory  Result Value Ref Range Status   Adenovirus NOT DETECTED NOT DETECTED Final   Coronavirus 229E NOT DETECTED NOT DETECTED Final    Comment: (NOTE) The Coronavirus on the Respiratory Panel, DOES NOT test for the novel  Coronavirus (2019 nCoV)    Coronavirus HKU1 NOT DETECTED NOT DETECTED Final   Coronavirus NL63 NOT DETECTED NOT DETECTED Final   Coronavirus OC43 NOT DETECTED NOT DETECTED Final   Metapneumovirus NOT DETECTED NOT DETECTED Final   Rhinovirus / Enterovirus NOT DETECTED NOT DETECTED Final   Influenza A NOT DETECTED NOT DETECTED Final   Influenza B NOT DETECTED NOT DETECTED Final   Parainfluenza Virus 1 NOT DETECTED NOT DETECTED  Final   Parainfluenza Virus 2 NOT DETECTED NOT DETECTED Final   Parainfluenza Virus 3 NOT DETECTED NOT DETECTED Final   Parainfluenza Virus 4 NOT DETECTED NOT DETECTED Final   Respiratory Syncytial Virus NOT DETECTED NOT DETECTED Final   Bordetella pertussis NOT DETECTED NOT DETECTED Final   Bordetella Parapertussis NOT DETECTED NOT DETECTED Final   Chlamydophila pneumoniae NOT DETECTED NOT DETECTED Final   Mycoplasma pneumoniae NOT DETECTED NOT DETECTED Final    Comment: Performed at [facility], [address], [place], [place] [address]  Expectorated Sputum Assessment w Gram Stain, Rflx to Resp Cult     Status: None   Collection Time: 10/05/23  9:07 AM   Specimen: Sputum  Result Value Ref Range Status   Specimen Description SPUTUM  Final   Special Requests NONE  Final   Sputum evaluation   Final    Sputum specimen not acceptable for testing.  Please recollect.   C/KERRY NELSON AT 1005 10/05/23.PMF Performed at [facility], [address] [place], [place] [address]    Report Status 10/05/2023 FINAL  Final  Expectorated Sputum Assessment w Gram Stain, Rflx to Resp Cult     Status: None   Collection Time: 10/05/23 10:50 AM  Result Value Ref Range Status   Specimen Description EXPECTORATED SPUTUM  Final   Special Requests NONE  Final   Sputum evaluation   Final    THIS SPECIMEN IS ACCEPTABLE FOR SPUTUM CULTURE Performed at [facility], [address] [place], [place] [address]    Report Status 10/05/2023 FINAL  Final  Culture, Respiratory w Gram Stain     Status: None   Collection Time: 10/05/23 10:50 AM  Result Value Ref Range Status   Specimen Description   Final    EXPECTORATED SPUTUM Performed at [facility], [address], [place], [place] [address]    Special Requests   Final    NONE Reflexed from [phone] Performed at [facility], [address] [place], [place] [address]    Gram Stain   Final    RARE WBC SEEN RARE  VONNE POSITIVE RODS RARE VONNE POSITIVE COCCI RARE GRAM NEGATIVE RODS    Culture   Final    FEW Normal respiratory flora-no Staph aureus or Pseudomonas seen Performed at [facility], [address] [place], [place] [address]    Report Status 10/07/2023 FINAL  Final  MRSA Next  Gen by PCR, Nasal     Status: None   Collection Time: 10/06/23 12:57 AM   Specimen: Nasal Mucosa; Nasal Swab  Result Value Ref Range Status   MRSA by PCR Next Gen NOT DETECTED NOT DETECTED Final    Comment: (NOTE) The GeneXpert MRSA Assay (FDA approved for NASAL specimens only), is one component of a comprehensive MRSA colonization surveillance program. It is not intended to diagnose MRSA infection nor to guide or monitor treatment for MRSA infections. Test performance is not FDA approved in patients less than 74 years old. Performed at [facility], [address] [place], [place] [address]   Culture, Respiratory w Gram Stain     Status: None   Collection Time: 10/06/23 11:37 AM   Specimen: INDUCED SPUTUM  Result Value Ref Range Status   Specimen Description   Final    INDUCED SPUTUM Performed at [facility], [address], [place], [place] [address]    Special Requests   Final    NONE Performed at [facility], [address] [place], [place] [address]    Gram Stain   Final    FEW WBC PRESENT,BOTH PMN AND MONONUCLEAR FEW GRAM POSITIVE RODS    Culture   Final    MODERATE LACTOBACILLUS FERMENTUM Standardized susceptibility testing for this organism is not available. Performed at [facility], [address] [place], [place] [address]    Report Status 10/09/2023 FINAL  Final  Culture, blood (x 2)     Status: None   Collection Time: 10/22/23  1:00 AM   Specimen: BLOOD  Result Value Ref Range Status   Specimen Description BLOOD BLOOD RIGHT ARM  Final   Special Requests   Final    BOTTLES DRAWN AEROBIC AND ANAEROBIC Blood Culture adequate volume    Culture   Final    NO GROWTH 5 DAYS Performed at [facility], [address], [place], [place] [address]    Report Status 10/27/2023 FINAL  Final  Culture, blood (x 2)     Status: None   Collection Time: 10/22/23  1:00 AM   Specimen: BLOOD  Result Value Ref Range Status   Specimen Description BLOOD BLOOD RIGHT ARM  Final   Special Requests   Final    BOTTLES DRAWN AEROBIC AND ANAEROBIC Blood Culture adequate volume   Culture   Final    NO GROWTH 5 DAYS Performed at [facility], [address] [place], [place] [address]    Report Status 10/27/2023 FINAL  Final  Resp panel by RT-PCR (RSV, Flu A&B, Covid) Anterior Nasal Swab     Status: None   Collection Time: 11/09/23  5:31 PM   Specimen: Anterior Nasal Swab  Result Value Ref Range Status   SARS Coronavirus 2 by RT PCR NEGATIVE NEGATIVE Final    Comment: (NOTE) SARS-CoV-2 target nucleic acids are NOT DETECTED.  The SARS-CoV-2 RNA is generally detectable in upper respiratory specimens during the acute phase of infection. The lowest concentration of SARS-CoV-2 viral copies this assay can detect is 138 copies/mL. A negative result does not preclude SARS-Cov-2 infection and should not be used as the sole basis for treatment or other patient management decisions. A negative result may occur with  improper specimen collection/handling, submission of specimen other than nasopharyngeal swab, presence of viral mutation(s) within the areas targeted by this assay, and inadequate number of viral copies(<138 copies/mL). A negative result must be combined with clinical observations, patient history,  and epidemiological information. The expected result is Negative.  Fact Sheet for Patients:  BloggerCourse.com  Fact Sheet for Healthcare Providers:  SeriousBroker.it  This test is no t yet approved or cleared by the [place]  FDA and  has been authorized for  detection and/or diagnosis of SARS-CoV-2 by FDA under an Emergency Use Authorization (EUA). This EUA will remain  in effect (meaning this test can be used) for the duration of the COVID-19 declaration under Section 564(b)(1) of the Act, 21 U.S.C.section 360bbb-3(b)(1), unless the authorization is terminated  or revoked sooner.       Influenza A by PCR NEGATIVE NEGATIVE Final   Influenza B by PCR NEGATIVE NEGATIVE Final    Comment: (NOTE) The Xpert Xpress SARS-CoV-2/FLU/RSV plus assay is intended as an aid in the diagnosis of influenza from Nasopharyngeal swab specimens and should not be used as a sole basis for treatment. Nasal washings and aspirates are unacceptable for Xpert Xpress SARS-CoV-2/FLU/RSV testing.  Fact Sheet for Patients: BloggerCourse.com  Fact Sheet for Healthcare Providers: SeriousBroker.it  This test is not yet approved or cleared by the [place]  FDA and has been authorized for detection and/or diagnosis of SARS-CoV-2 by FDA under an Emergency Use Authorization (EUA). This EUA will remain in effect (meaning this test can be used) for the duration of the COVID-19 declaration under Section 564(b)(1) of the Act, 21 U.S.C. section 360bbb-3(b)(1), unless the authorization is terminated or revoked.     Resp Syncytial Virus by PCR NEGATIVE NEGATIVE Final    Comment: (NOTE) Fact Sheet for Patients: BloggerCourse.com  Fact Sheet for Healthcare Providers: SeriousBroker.it  This test is not yet approved or cleared by the [place]  FDA and has been authorized for detection and/or diagnosis of SARS-CoV-2 by FDA under an Emergency Use Authorization (EUA). This EUA will remain in effect (meaning this test can be used) for the duration of the COVID-19 declaration under Section 564(b)(1) of the Act, 21 U.S.C. section 360bbb-3(b)(1), unless the authorization is  terminated or revoked.  Performed at [facility], [address] [place], [place] [address]   Respiratory (~20 pathogens) panel by PCR     Status: None   Collection Time: 11/09/23  5:31 PM   Specimen: Nasopharyngeal Swab; Respiratory  Result Value Ref Range Status   Adenovirus NOT DETECTED NOT DETECTED Final   Coronavirus 229E NOT DETECTED NOT DETECTED Final    Comment: (NOTE) The Coronavirus on the Respiratory Panel, DOES NOT test for the novel  Coronavirus (2019 nCoV)    Coronavirus HKU1 NOT DETECTED NOT DETECTED Final   Coronavirus NL63 NOT DETECTED NOT DETECTED Final   Coronavirus OC43 NOT DETECTED NOT DETECTED Final   Metapneumovirus NOT DETECTED NOT DETECTED Final   Rhinovirus / Enterovirus NOT DETECTED NOT DETECTED Final   Influenza A NOT DETECTED NOT DETECTED Final   Influenza B NOT DETECTED NOT DETECTED Final   Parainfluenza Virus 1 NOT DETECTED NOT DETECTED Final   Parainfluenza Virus 2 NOT DETECTED NOT DETECTED Final   Parainfluenza Virus 3 NOT DETECTED NOT DETECTED Final   Parainfluenza Virus 4 NOT DETECTED NOT DETECTED Final   Respiratory Syncytial Virus NOT DETECTED NOT DETECTED Final   Bordetella pertussis NOT DETECTED NOT DETECTED Final   Bordetella Parapertussis NOT DETECTED NOT DETECTED Final   Chlamydophila pneumoniae NOT DETECTED NOT DETECTED Final   Mycoplasma pneumoniae NOT DETECTED NOT DETECTED Final    Comment: Performed at [facility], [address], [place], [place] [address]  Culture, blood (Routine  X 2) w Reflex to ID Panel     Status: None   Collection Time: 11/09/23  6:16 PM   Specimen: BLOOD  Result Value Ref Range Status   Specimen Description   Final    BLOOD RIGHT ANTECUBITAL Performed at [facility], [address], [place], [place] [address]    Special Requests   Final    BOTTLES DRAWN AEROBIC ONLY Blood Culture adequate volume Performed at [facility], [address], [place], [place] [address]     Culture   Final    NO GROWTH 11 DAYS Performed at [facility], [address], [place], [place] [address]    Report Status 11/20/2023 FINAL  Final  Culture, blood (Routine X 2) w Reflex to ID Panel     Status: None   Collection Time: 11/09/23  6:23 PM   Specimen: BLOOD  Result Value Ref Range Status   Specimen Description   Final    BLOOD BLOOD LEFT FOREARM Performed at [facility], [address], [place], [place] [address]    Special Requests   Final    BOTTLES DRAWN AEROBIC AND ANAEROBIC Blood Culture adequate volume Performed at [facility], [address], [place], [place] [address]    Culture   Final    NO GROWTH 11 DAYS Performed at [facility], [address], [place], [place] [address]    Report Status 11/20/2023 FINAL  Final  Urine Culture     Status: Abnormal   Collection Time: 11/10/23  1:58 AM   Specimen: Urine, Random  Result Value Ref Range Status   Specimen Description   Final    URINE, RANDOM Performed at [facility], [address] [place], [place] [address]    Special Requests   Final    NONE Reflexed from [phone] Performed at [facility], [address] [place], [place] [address]    Culture 60,000 COLONIES/mL ENTEROCOCCUS FAECALIS (A)  Final   Report Status 11/12/2023 FINAL  Final   Organism ID, Bacteria ENTEROCOCCUS FAECALIS (A)  Final      Susceptibility   Enterococcus faecalis - MIC*    AMPICILLIN  <=2 SENSITIVE Sensitive     NITROFURANTOIN <=16 SENSITIVE Sensitive     VANCOMYCIN  1 SENSITIVE Sensitive     * 60,000 COLONIES/mL ENTEROCOCCUS FAECALIS  Culture, blood (x 2)     Status: None   Collection Time: 11/25/23 10:31 PM   Specimen: BLOOD  Result Value Ref Range Status   Specimen Description BLOOD BLOOD LEFT ARM  Final   Special Requests   Final    BOTTLES DRAWN AEROBIC AND ANAEROBIC Blood Culture adequate volume   Culture   Final    NO GROWTH 5 DAYS Performed at [facility], [address] [place], [place] [address]    Report Status 11/30/2023 FINAL  Final  Culture, blood (x 2)     Status: None   Collection Time: 11/25/23 10:31 PM   Specimen: BLOOD  Result Value Ref Range Status   Specimen Description BLOOD BLOOD RIGHT ARM  Final   Special Requests   Final    BOTTLES DRAWN AEROBIC AND ANAEROBIC Blood Culture adequate volume   Culture   Final    NO GROWTH 5 DAYS Performed at [facility], [address] [place], [place] [address]    Report Status 11/30/2023 FINAL  Final    Coagulation Studies: No results for input(s): LABPROT, INR in  the last 72 hours.   Urinalysis: No results for input(s): COLORURINE, LABSPEC, PHURINE, GLUCOSEU, HGBUR, BILIRUBINUR, KETONESUR, PROTEINUR, UROBILINOGEN, NITRITE, LEUKOCYTESUR in the last 72 hours.  Invalid input(s): APPERANCEUR     Imaging: No results found.       Medications:    albumin  human 25 g (11/29/23 1106)   piperacillin -tazobactam (ZOSYN )  IV 2.25 g (11/30/23 0541)    sodium chloride    Intravenous Once   Chlorhexidine  Gluconate Cloth  6 each Topical Q0600   epoetin  alfa-epbx (RETACRIT ) injection  10,000 Units Intravenous Q M,W,F-HD   feeding supplement (NEPRO CARB STEADY)  237 mL Per Tube 5 X Daily   feeding supplement (PROSource TF20)  60 mL Per Tube Daily   ferrous sulfate   325 mg Per Tube Daily   free water   60 mL Per Tube 5 X Daily   gentamicin  ointment   Topical TID   heparin  injection (subcutaneous)  5,000 Units Subcutaneous Q8H   insulin  aspart  10 Units Subcutaneous Once   lactobacillus  1 g Per Tube TID WC   midodrine   5 mg Per Tube TID WC   multivitamin  1 tablet Per Tube QHS   pantoprazole  (PROTONIX ) IV  40 mg Intravenous Q12H   polyethylene glycol  17 g Per Tube Daily   acetaminophen  **OR** acetaminophen , albumin  human, artificial tears, bisacodyl , ipratropium-albuterol , [DISCONTINUED] ondansetron  **OR** ondansetron  (ZOFRAN ) IV, mouth  rinse  Assessment/ Plan:  Mr. Robert Vega is a 83 y.o.  male with obstructive sleep apnea, GERD, obesity, hypertension, dysphagia, severe esophageal dysmotility with achalasia status post PEG tube placement, recent aspiration pneumonia acute respiratory failure status post extubation 10/09/2023, who was admitted to [facility] on 10/05/2023 for Aspiration into respiratory tract, initial encounter [T17.908A] Severe sepsis (HCC) [A41.9, R65.20] History of dysphagia [Z87.898] Fever, unspecified fever cause [R50.9] Multifocal pneumonia [J18.9]   1.  Acute kidney injury. Requiring hemodialysis. With obstructive uropathy Baseline creatinine 0.81 from 10/14/2023.  Acute kidney injury secondary to ATN, obstructive uropathy, and progression of prerenal azotemia.   Patient is critically ill.  He was started on urgent hemodialysis for severe hyperkalemia and uremia. Vascular surgery placed PermCath on 6/26.  - TOC dialysis coordinator notified of need for outpatient dialysis clinic.  Search in progress. - Patient seen and examined on hemodialysis treatment. Patient to continue MWF schedule.  - requiring IV albumin  with dialysis treatments for blood pressure support. - will discuss with family. Overall prognosis is poor   2.  Sepsis and hypotension due to aspiration pneumonia, found to have distal esophageal obstruction with severe dysmotility.  He is status post botulinum toxin injection to the lower esophageal sphincter.  PEG tube placed this admission. Receiving continuous tube feeds with scheduled free water  flushes.  - Continue midodrine   4.Anemia in the setting of renal failure Lab Results  Component Value Date   HGB 7.2 (L) 11/30/2023  - EPO with HD treatments.   5.  Bilateral hydronephrosis Noted on CT.Urology team has evaluated the patient and it is thought to be secondary to reflux.  Due to health, further intervention deferred at this time.  Discussed case with patient's granddaughter,  [name].      LOS: 56 Ulysses Alper 7/5/202511:39 AM

## 2023-11-30 NOTE — Progress Notes (Signed)
 Left voice msg for Robert Vega. Pt's girlfriend brought two lady visitors to the floor without permission and said she had been given permission to bring them so I am confirming with Robert Vega.

## 2023-11-30 NOTE — Progress Notes (Signed)
 Progress Note   Patient: Robert Vega FMW:[id number] DOB: 1940-07-22 DOA: 10/05/2023     56 DOS: the patient was seen and examined on 11/30/2023   Brief hospital course:  HPI was taken from Dr. Eldonna: Robert Vega is a 83 y.o. male with medical history significant of hypertension, sleep apnea, obesity, GERD presenting with acute febrile hypoxia, sepsis  and pneumonia.  Patient noted to be overall poor historian.  Per report, patient with increased work of breathing generalized weakness over the past 12 to 24 hours.  Was initially evaluated urgent care however EMS had to be called.  No severe weakness.  Mild cough per report.  No chest pain or abdominal pain.  No reported nausea or vomiting.  Seen by EMS with noted fever 100.7 and route.  Hypoxic to the mid 80s on room air. Presented to the ER Tmax 22.1, heart rate 90s, respirations mid 20s, BP 80s to 130s.  Requiring 4 L nasal cannula to keep O2 sats greater than 92%.  White count 7.7, hemoglobin 12, platelets 216, lactate 2.2.  COVID flu RSV negative.  Creatinine 1.  Glucose 152.  Chest x-ray with bilateral lower lobe pneumonia.  Positive pulmonary vascular congestion.   As per Dr. Lenon: [name] is a 83 y.o male with significant PMH of OSA, GERD, Obesity, HTN, Dysphagia - presented to the ED 10/05/2023 from with hypoxia, fever and generalized weakness. Dx sepsis/pneumonia, concern for aspiration    Of note, EGD 03/2022 with food in upper esophagus complicated by aspiration event, cardiac arrest with round of CPR, and post resuscitation EGD with concern for lack of peristalsis. Hx prior EGD 08/2020 with note of abnormal cricopharyngeus, decrease in motility in esophagus, and spastic LES    5/10: Admit to [facility] service with sepsis due to Aspiration Pneumonia.  Course complicated by Acute Respiratory Failure due to Aspiration of vomitus w/ cardiac arrest transfer to ICU and intubation.  5/11: flexible bronchoscopy  5/13: PEG tube,  +vomiting last night, failed SAT/SBT 5/14: extubated but with severe delirium 5/20:  Botulinum toxin injection into the lower esophageal sphincter by Dr. Jinny 5/21: esophagram showing diffusely distended esophagus with distal obstruction and severe dysmotility suggestive of achalasia. At that point patient was stabilized to point of pursuing SNF placement but then with complications of PEG tube placement resulted in return to OR 5/27 and tube feeds restarted 5/28 with zosyn  for peritonitis.  Had new melena 6/9 so underwent another EGD without acute bleeding seen.  Renal function declined and nephrology was consulted and without improvement with conservative management, he received dialysis access and started on HD 6/16 as this was in line with family goals of care.  Discharge will now be pending arrangement of facility and outpatient HD if he does not have renal recovery.  He remains in poor prognosis state with intermittently needing to hold tube feeds again for intolerance and needed blood transfusion 6/18 for anemia likely related more to the renal failure as no source of bleeding has been identified thus far. Mental status is oriented to self and location and able to follow simple commands but otherwise confused. PT/OT recommends SNF. Permcath placement scheduled for this am.     As per Dr. Trudy 6/28-11/26/23: Pt is very sick. Overnight, 06/30 pt spiked a low grade fever and was started on IV broad sprectrum abxs, IV flagyl , vanco, cefepime . CT chest/abd/pelvis ordered. Pt's granddaughter is requesting ID consult. Please consult ID in AM as per granddaughter's request. Pt is  full code and full scope of care despite pt having failure to thrive.        07/02 -called and updated patient's granddaughter [name] who had questions about her grandfather's condition and treatment plan.  She verbalized understanding and agreement with plan.   07/03 - called and updated patient's granddaughter  [name] on her grandfathers condition and treatment plan.  All questions and concerns were addressed.   07/05 -called patient's granddaughter for an update, no response, left voicemail for patient's granddaughter     Assessment and Plan:  Failure to thrive: secondary to all below. Continue w/ full code, full scope of care as per pt's granddaughter.    SIRS: Suspect secondary to aspiration pneumonia started on night on 11/25/23. W/ leukocytosis, tachypnea, fever and unknown source.  Started initially on IV broad sprectrum abxs, IV flagyl , vanco, cefepime .  Patient had a CT scan of the chest/abdomen/pelvis which showed moderate bilateral hydronephrosis and hydroureter to the ureterovesicular junction without calculus or other visible obstruction likely secondary to ureterovesicular junction due to bladder wall edema. Interval increase in extensive bilateral simple attenuation perinephric fluid. Pleural effusion similar to prior examination.  Anasarca. Cholelithiasis. Percutaneous gastrostomy. Coronary artery disease. Patient noted to have worsening leukocytosis.  Appreciate ID input Recommends to obtain UA if possible and to continue broad-spectrum antibiotics. Straight cath was attempted and was unsuccessful. Appreciate ID input, recommends to treat patient with IV Zosyn  for suspected aspiration pneumonia for 7 days We will discontinue cefepime , Vanco and Flagyl  Blood cultures have been sterile for  3 days.       AKI:  Hyperkalemia: Managed by renal replacement therapy Secondary to ATN as per nephro.  On HD MWF.  Renal replacement therapy was discontinued  early b/c of hypotension & bradycardia. Nephro following and recs apprec        Possible ileus:  Abdomen remains distended Monitor closely     Anemia of chronic kidney disease likely secondary to CKD. S/p 2 units of pRBCs transfused so far.  Continue iron supplementation     Achalasia & dysphagia: w/ high aspiration  risk.  S/p botulinum toxin injection on 5/20, several EGD and SLP evals.  Not safe for PO intake unless family wants to accept risk that he will aspirate again. Continue w/ tube feeds      Severe sepsis: resolved. Initially w/ aspiration pna. Later in hospital stay with development of  intra-abdominal infection secondary to PEG dislodged.   Acute hypoxic & hypercapnic respiratory failure:  s/p intubation, ventilation & extubation.  Continue on supplemental oxygen and wean as tolerated     Acute on chronic diastolic dysfunction CHF  XR showed interstitial edema/CHF.  Fluid/volume management w/ HD      Delirium: vs mild cognitive impairment vs anoxic brain injury during critical illness in the ICU, requiring intubation & pressors. Continue w/ supportive care. Granddaughter asked to d/c prn antipsychotics. Continue w/ supportive care    GERD: continue on PPI    HTN: BP is on the low end of normal     Generalized weakness: PT/OT recs SNF   Obesity: BMI 32.8.   Moderate malnutrition Secondary to acute illness Continue tube feeds   Dislodged PEG tube (11/28/23) Patient noted to have dislodged PEG tube Imaging showed  It appears likely that the retention balloon has migrated distally. Recommend retraction of the gastrostomy tube until the retention balloon is felt at the level of the anterior gastric wall. Appreciate GI input.  PEG tube fixed  Subjective: Unable to assess  Physical Exam: Vitals:   11/29/23 2110 11/30/23 0137 11/30/23 0519 11/30/23 0805  BP: 125/65 120/61 126/67 (!) 113/55  Pulse: 81 78 77 70  Resp: 20 (!) 22 (!) 26 16  Temp: 99.2 F (37.3 C) 98.6 F (37 C) 98.4 F (36.9 C) 98 F (36.7 C)  TempSrc: Axillary Axillary Axillary   SpO2: 96% 96% 90% 95%  Weight:      Height:       General exam: Lying in bed, Lethargic  Respiratory system: decreased breath sounds b/l  Cardiovascular system: S1 & S2+. No rubs or clicks    Gastrointestinal  system: abd is soft, tender RLQ, obese, hypoactive bowel sounds  Central nervous system: Lethargic Psychiatry: Unable to assess     Data Reviewed: BUN 60, creatinine 4.50  There are no new results to review at this time.  Family Communication: Attempted to call patient's granddaughter to give an update, no response.  Voicemail left.  Disposition: Status is: Inpatient Remains inpatient appropriate because: On IV antibiotics for aspiration pneumonia  Planned Discharge Destination: TBD    Time spent: 40 minutes  Author: Aimee Somerset, MD 11/30/2023 12:42 PM  For on call review [email or web address].

## 2023-12-01 DIAGNOSIS — R6520 Severe sepsis without septic shock: Secondary | ICD-10-CM | POA: Diagnosis not present

## 2023-12-01 DIAGNOSIS — A419 Sepsis, unspecified organism: Secondary | ICD-10-CM | POA: Diagnosis not present

## 2023-12-01 LAB — CBC
HCT: 26.6 % — ABNORMAL LOW (ref 39.0–52.0)
Hemoglobin: 8.1 g/dL — ABNORMAL LOW (ref 13.0–17.0)
MCH: 28.6 pg (ref 26.0–34.0)
MCHC: 30.5 g/dL (ref 30.0–36.0)
MCV: 94 fL (ref 80.0–100.0)
Platelets: 463 K/uL — ABNORMAL HIGH (ref 150–400)
RBC: 2.83 MIL/uL — ABNORMAL LOW (ref 4.22–5.81)
RDW: 15.8 % — ABNORMAL HIGH (ref 11.5–15.5)
WBC: 18.7 K/uL — ABNORMAL HIGH (ref 4.0–10.5)
nRBC: 0 % (ref 0.0–0.2)

## 2023-12-01 LAB — BASIC METABOLIC PANEL WITH GFR
Anion gap: 15 (ref 5–15)
BUN: 83 mg/dL — ABNORMAL HIGH (ref 8–23)
CO2: 24 mmol/L (ref 22–32)
Calcium: 8 mg/dL — ABNORMAL LOW (ref 8.9–10.3)
Chloride: 98 mmol/L (ref 98–111)
Creatinine, Ser: 5.92 mg/dL — ABNORMAL HIGH (ref 0.61–1.24)
GFR, Estimated: 9 mL/min — ABNORMAL LOW (ref >60)
Glucose, Bld: 121 mg/dL — ABNORMAL HIGH (ref 70–99)
Potassium: 4.2 mmol/L (ref 3.5–5.1)
Sodium: 137 mmol/L (ref 135–145)

## 2023-12-01 LAB — GLUCOSE, CAPILLARY
Glucose-Capillary: 120 mg/dL — ABNORMAL HIGH (ref 70–99)
Glucose-Capillary: 156 mg/dL — ABNORMAL HIGH (ref 70–99)
Glucose-Capillary: 138 mg/dL — ABNORMAL HIGH (ref 70–99)
Glucose-Capillary: 101 mg/dL — ABNORMAL HIGH (ref 70–99)

## 2023-12-01 MED ORDER — BANATROL TF EN LIQD
60.0000 mL | Freq: Two times a day (BID) | ENTERAL | Status: DC
  Administered 2023-12-01 – 2023-12-08 (×15): 60 mL

## 2023-12-01 NOTE — TOC Progression Note (Signed)
 Transition of Care [facility]) - Progression Note    Patient Details  Name: Robert Vega MRN: [id number] Date of Birth: 04/15/1941  Transition of [facility]) CM/SW Contact  Lorraine LILLETTE [place], [place] Phone Number: 12/01/2023, 9:02 AM  Clinical Narrative:     Pt has several bed offers, CSW will present to pt/family for consideration and DC planning.    Barriers to Discharge: Continued Medical Work up  Expected Discharge Plan and Services                                               Social Determinants of Health (SDOH) Interventions SDOH Screenings   Food Insecurity: Patient Unable To Answer (10/06/2023)  Recent Concern: Food Insecurity - Food Insecurity Present (09/11/2023)   Received from [facility]  Housing: Patient Unable To Answer (10/06/2023)  Recent Concern: Housing - High Risk (09/11/2023)   Received from [facility]  Transportation Needs: Patient Unable To Answer (10/06/2023)  Utilities: Patient Unable To Answer (10/06/2023)  Depression (PHQ2-9): Low Risk  (05/07/2023)  Financial Resource Strain: Medium Risk (09/11/2023)   Received from [facility]  Social Connections: Unknown (10/06/2023)  Tobacco Use: Medium Risk (11/05/2023)    Readmission Risk Interventions     No data to display

## 2023-12-01 NOTE — Plan of Care (Signed)

## 2023-12-01 NOTE — Progress Notes (Signed)
 [facility]  ROUNDING NOTE   Subjective:   Less responsive this morning.   Granddaughter on phone   Objective:  Vital signs in last 24 hours:  Temp:  [97.5 F (36.4 C)-98.6 F (37 C)] 97.6 F (36.4 C) (07/06 0815) Pulse Rate:  [69-85] 79 (07/06 0815) Resp:  [16-20] 16 (07/06 0815) BP: (111-148)/(50-79) 115/50 (07/06 0815) SpO2:  [96 %-99 %] 98 % (07/06 0815)  Weight change:  Filed Weights   11/27/23 2038 11/29/23 0830 11/29/23 1230  Weight: 96.9 kg 96.8 kg 95.3 kg    Intake/Output: I/O last 3 completed shifts: In: 474 [NG/GT:474] Out: 0    Intake/Output this shift:  No intake/output data recorded.  Physical Exam: General: Ill appearing, laying in bed  Head: Oral mucosa moist  Eyes: Anicteric  Lungs:  Diminished  Heart: Regular rate and rhythm  Abdomen:  Peg tube in place, +distended  Extremities: no peripheral edema.  Neurologic: Alert and oriented to self  Skin: No lesions  Access: Rt internal jugular permcath    Basic Metabolic Panel: Recent Labs  Lab 11/27/23 0452 11/28/23 0931 11/29/23 0825 11/29/23 0840 11/30/23 0911 12/01/23 1006  NA 132* 132* 133* 131* 135 137  K 5.2* 4.0 4.3 4.2 4.3 4.2  CL 95* 94* 94* 92* 93* 98  CO2 23 24 26 24 26 24   GLUCOSE 97 124* 125* 123* 132* 121*  BUN 99* 72* 96* 94* 60* 83*  CREATININE 7.41* 5.26* 6.65* 6.74* 4.50* 5.92*  CALCIUM  7.8* 7.5* 7.6* 7.5* 7.6* 8.0*  MG 2.7*  --   --   --   --   --   PHOS 7.3*  --   --  5.0*  --   --     Liver Function Tests: Recent Labs  Lab 11/25/23 2158 11/29/23 0840  AST 19  --   ALT 13  --   ALKPHOS 77  --   BILITOT 0.2  --   PROT 6.7  --   ALBUMIN  1.8* 1.7*   No results for input(s): LIPASE, AMYLASE in the last 168 hours. No results for input(s): AMMONIA in the last 168 hours.  CBC: Recent Labs  Lab 11/25/23 2158 11/26/23 0409 11/27/23 0452 11/28/23 0931 11/29/23 0825 11/30/23 0911 12/01/23 1006  WBC 20.9*  20.8*   < > 23.1* 25.4* 24.9*  20.3* 18.7*  NEUTROABS 17.0*  --   --  21.4* 20.1*  --   --   HGB 7.6*  7.5*   < > 7.4* 7.2* 7.4* 7.2* 8.1*  HCT 23.5*  23.3*   < > 23.7* 22.4* 23.7* 23.0* 26.6*  MCV 91.4  91.4   < > 92.2 90.3 92.9 92.0 94.0  PLT 350  332   < > 381 400 441* 401* 463*   < > = values in this interval not displayed.    Cardiac Enzymes: No results for input(s): CKTOTAL, CKMB, CKMBINDEX, TROPONINI in the last 168 hours.  BNP: Invalid input(s): POCBNP  CBG: Recent Labs  Lab 11/30/23 1146 11/30/23 1710 11/30/23 2240 12/01/23 0817 12/01/23 1155  GLUCAP 118* 113* 137* 120* 156*    Microbiology: Results for orders placed or performed during the hospital encounter of 10/05/23  Blood Culture (routine x 2)     Status: None   Collection Time: 10/05/23  5:06 AM   Specimen: BLOOD  Result Value Ref Range Status   Specimen Description BLOOD LA  Final   Special Requests   Final    BOTTLES DRAWN AEROBIC  AND ANAEROBIC Blood Culture results may not be optimal due to an inadequate volume of blood received in culture bottles   Culture   Final    NO GROWTH 5 DAYS Performed at [facility], [address] [place], [place] [address]    Report Status 10/10/2023 FINAL  Final  Blood Culture (routine x 2)     Status: None   Collection Time: 10/05/23  5:07 AM   Specimen: BLOOD  Result Value Ref Range Status   Specimen Description BLOOD RA  Final   Special Requests   Final    BOTTLES DRAWN AEROBIC AND ANAEROBIC Blood Culture results may not be optimal due to an inadequate volume of blood received in culture bottles   Culture   Final    NO GROWTH 5 DAYS Performed at [facility], [address] [place], [place] [address]    Report Status 10/10/2023 FINAL  Final  Resp panel by RT-PCR (RSV, Flu A&B, Covid) Anterior Nasal Swab     Status: None   Collection Time: 10/05/23  5:56 AM   Specimen: Anterior Nasal Swab  Result Value Ref Range Status   SARS Coronavirus 2 by RT PCR  NEGATIVE NEGATIVE Final    Comment: (NOTE) SARS-CoV-2 target nucleic acids are NOT DETECTED.  The SARS-CoV-2 RNA is generally detectable in upper respiratory specimens during the acute phase of infection. The lowest concentration of SARS-CoV-2 viral copies this assay can detect is 138 copies/mL. A negative result does not preclude SARS-Cov-2 infection and should not be used as the sole basis for treatment or other patient management decisions. A negative result may occur with  improper specimen collection/handling, submission of specimen other than nasopharyngeal swab, presence of viral mutation(s) within the areas targeted by this assay, and inadequate number of viral copies(<138 copies/mL). A negative result must be combined with clinical observations, patient history, and epidemiological information. The expected result is Negative.  Fact Sheet for Patients:  BloggerCourse.com  Fact Sheet for Healthcare Providers:  SeriousBroker.it  This test is no t yet approved or cleared by the [place]  FDA and  has been authorized for detection and/or diagnosis of SARS-CoV-2 by FDA under an Emergency Use Authorization (EUA). This EUA will remain  in effect (meaning this test can be used) for the duration of the COVID-19 declaration under Section 564(b)(1) of the Act, 21 U.S.C.section 360bbb-3(b)(1), unless the authorization is terminated  or revoked sooner.       Influenza A by PCR NEGATIVE NEGATIVE Final   Influenza B by PCR NEGATIVE NEGATIVE Final    Comment: (NOTE) The Xpert Xpress SARS-CoV-2/FLU/RSV plus assay is intended as an aid in the diagnosis of influenza from Nasopharyngeal swab specimens and should not be used as a sole basis for treatment. Nasal washings and aspirates are unacceptable for Xpert Xpress SARS-CoV-2/FLU/RSV testing.  Fact Sheet for Patients: BloggerCourse.com  Fact Sheet for  Healthcare Providers: SeriousBroker.it  This test is not yet approved or cleared by the [place]  FDA and has been authorized for detection and/or diagnosis of SARS-CoV-2 by FDA under an Emergency Use Authorization (EUA). This EUA will remain in effect (meaning this test can be used) for the duration of the COVID-19 declaration under Section 564(b)(1) of the Act, 21 U.S.C. section 360bbb-3(b)(1), unless the authorization is terminated or revoked.     Resp Syncytial Virus by PCR NEGATIVE NEGATIVE Final    Comment: (NOTE) Fact Sheet for Patients: BloggerCourse.com  Fact Sheet for Healthcare Providers: SeriousBroker.it  This test  is not yet approved or cleared by the [place]  FDA and has been authorized for detection and/or diagnosis of SARS-CoV-2 by FDA under an Emergency Use Authorization (EUA). This EUA will remain in effect (meaning this test can be used) for the duration of the COVID-19 declaration under Section 564(b)(1) of the Act, 21 U.S.C. section 360bbb-3(b)(1), unless the authorization is terminated or revoked.  Performed at [facility], [address] [place], [place] [address]   Respiratory (~20 pathogens) panel by PCR     Status: None   Collection Time: 10/05/23  8:11 AM   Specimen: Nasopharyngeal Swab; Respiratory  Result Value Ref Range Status   Adenovirus NOT DETECTED NOT DETECTED Final   Coronavirus 229E NOT DETECTED NOT DETECTED Final    Comment: (NOTE) The Coronavirus on the Respiratory Panel, DOES NOT test for the novel  Coronavirus (2019 nCoV)    Coronavirus HKU1 NOT DETECTED NOT DETECTED Final   Coronavirus NL63 NOT DETECTED NOT DETECTED Final   Coronavirus OC43 NOT DETECTED NOT DETECTED Final   Metapneumovirus NOT DETECTED NOT DETECTED Final   Rhinovirus / Enterovirus NOT DETECTED NOT DETECTED Final   Influenza A NOT DETECTED NOT DETECTED Final   Influenza  B NOT DETECTED NOT DETECTED Final   Parainfluenza Virus 1 NOT DETECTED NOT DETECTED Final   Parainfluenza Virus 2 NOT DETECTED NOT DETECTED Final   Parainfluenza Virus 3 NOT DETECTED NOT DETECTED Final   Parainfluenza Virus 4 NOT DETECTED NOT DETECTED Final   Respiratory Syncytial Virus NOT DETECTED NOT DETECTED Final   Bordetella pertussis NOT DETECTED NOT DETECTED Final   Bordetella Parapertussis NOT DETECTED NOT DETECTED Final   Chlamydophila pneumoniae NOT DETECTED NOT DETECTED Final   Mycoplasma pneumoniae NOT DETECTED NOT DETECTED Final    Comment: Performed at [facility], [address] [place], [place] [address]  Expectorated Sputum Assessment w Gram Stain, Rflx to Resp Cult     Status: None   Collection Time: 10/05/23  9:07 AM   Specimen: Sputum  Result Value Ref Range Status   Specimen Description SPUTUM  Final   Special Requests NONE  Final   Sputum evaluation   Final    Sputum specimen not acceptable for testing.  Please recollect.   C/KERRY NELSON AT 1005 10/05/23.PMF Performed at [facility], [address] [place], [place] [address]    Report Status 10/05/2023 FINAL  Final  Expectorated Sputum Assessment w Gram Stain, Rflx to Resp Cult     Status: None   Collection Time: 10/05/23 10:50 AM  Result Value Ref Range Status   Specimen Description EXPECTORATED SPUTUM  Final   Special Requests NONE  Final   Sputum evaluation   Final    THIS SPECIMEN IS ACCEPTABLE FOR SPUTUM CULTURE Performed at [facility], [address] [place], [place] [address]    Report Status 10/05/2023 FINAL  Final  Culture, Respiratory w Gram Stain     Status: None   Collection Time: 10/05/23 10:50 AM  Result Value Ref Range Status   Specimen Description   Final    EXPECTORATED SPUTUM Performed at [facility], [address], [place], [place] [address]    Special Requests   Final    NONE Reflexed from [id number] Performed at [facility], [address] [place], [place] [address]    Gram Stain   Final    RARE WBC SEEN RARE GRAM POSITIVE RODS RARE GRAM POSITIVE COCCI RARE GRAM NEGATIVE RODS  Culture   Final    FEW Normal respiratory flora-no Staph aureus or Pseudomonas seen Performed at [facility], [address], [place], [place] [address]    Report Status 10/07/2023 FINAL  Final  MRSA Next Gen by PCR, Nasal     Status: None   Collection Time: 10/06/23 12:57 AM   Specimen: Nasal Mucosa; Nasal Swab  Result Value Ref Range Status   MRSA by PCR Next Gen NOT DETECTED NOT DETECTED Final    Comment: (NOTE) The GeneXpert MRSA Assay (FDA approved for NASAL specimens only), is one component of a comprehensive MRSA colonization surveillance program. It is not intended to diagnose MRSA infection nor to guide or monitor treatment for MRSA infections. Test performance is not FDA approved in patients less than 54 years old. Performed at [facility], [address] [place], [place] [address]   Culture, Respiratory w Gram Stain     Status: None   Collection Time: 10/06/23 11:37 AM   Specimen: INDUCED SPUTUM  Result Value Ref Range Status   Specimen Description   Final    INDUCED SPUTUM Performed at [facility], [address] [place], [place] [address]    Special Requests   Final    NONE Performed at [facility], [address] [place], [place] [address]    Gram Stain   Final    FEW WBC PRESENT,BOTH PMN AND MONONUCLEAR FEW GRAM POSITIVE RODS    Culture   Final    MODERATE LACTOBACILLUS FERMENTUM Standardized susceptibility testing for this organism is not available. Performed at [facility], [address], [place], [place] [address]    Report Status 10/09/2023 FINAL  Final  Culture, blood (x 2)     Status: None   Collection Time: 10/22/23  1:00 AM   Specimen: BLOOD  Result Value Ref Range Status   Specimen Description BLOOD BLOOD RIGHT ARM  Final   Special  Requests   Final    BOTTLES DRAWN AEROBIC AND ANAEROBIC Blood Culture adequate volume   Culture   Final    NO GROWTH 5 DAYS Performed at [facility], [address] [place], [place] [address]    Report Status 10/27/2023 FINAL  Final  Culture, blood (x 2)     Status: None   Collection Time: 10/22/23  1:00 AM   Specimen: BLOOD  Result Value Ref Range Status   Specimen Description BLOOD BLOOD RIGHT ARM  Final   Special Requests   Final    BOTTLES DRAWN AEROBIC AND ANAEROBIC Blood Culture adequate volume   Culture   Final    NO GROWTH 5 DAYS Performed at [facility], [address] [place], [place] [address]    Report Status 10/27/2023 FINAL  Final  Resp panel by RT-PCR (RSV, Flu A&B, Covid) Anterior Nasal Swab     Status: None   Collection Time: 11/09/23  5:31 PM   Specimen: Anterior Nasal Swab  Result Value Ref Range Status   SARS Coronavirus 2 by RT PCR NEGATIVE NEGATIVE Final    Comment: (NOTE) SARS-CoV-2 target nucleic acids are NOT DETECTED.  The SARS-CoV-2 RNA is generally detectable in upper respiratory specimens during the acute phase of infection. The lowest concentration of SARS-CoV-2 viral copies this assay can detect is 138 copies/mL. A negative result does not preclude SARS-Cov-2 infection and should not be used as the sole basis for treatment or other patient management decisions. A negative result may occur  with  improper specimen collection/handling, submission of specimen other than nasopharyngeal swab, presence of viral mutation(s) within the areas targeted by this assay, and inadequate number of viral copies(<138 copies/mL). A negative result must be combined with clinical observations, patient history, and epidemiological information. The expected result is Negative.  Fact Sheet for Patients:  BloggerCourse.com  Fact Sheet for Healthcare Providers:  SeriousBroker.it  This test is no  t yet approved or cleared by the [place]  FDA and  has been authorized for detection and/or diagnosis of SARS-CoV-2 by FDA under an Emergency Use Authorization (EUA). This EUA will remain  in effect (meaning this test can be used) for the duration of the COVID-19 declaration under Section 564(b)(1) of the Act, 21 U.S.C.section 360bbb-3(b)(1), unless the authorization is terminated  or revoked sooner.       Influenza A by PCR NEGATIVE NEGATIVE Final   Influenza B by PCR NEGATIVE NEGATIVE Final    Comment: (NOTE) The Xpert Xpress SARS-CoV-2/FLU/RSV plus assay is intended as an aid in the diagnosis of influenza from Nasopharyngeal swab specimens and should not be used as a sole basis for treatment. Nasal washings and aspirates are unacceptable for Xpert Xpress SARS-CoV-2/FLU/RSV testing.  Fact Sheet for Patients: BloggerCourse.com  Fact Sheet for Healthcare Providers: SeriousBroker.it  This test is not yet approved or cleared by the [place]  FDA and has been authorized for detection and/or diagnosis of SARS-CoV-2 by FDA under an Emergency Use Authorization (EUA). This EUA will remain in effect (meaning this test can be used) for the duration of the COVID-19 declaration under Section 564(b)(1) of the Act, 21 U.S.C. section 360bbb-3(b)(1), unless the authorization is terminated or revoked.     Resp Syncytial Virus by PCR NEGATIVE NEGATIVE Final    Comment: (NOTE) Fact Sheet for Patients: BloggerCourse.com  Fact Sheet for Healthcare Providers: SeriousBroker.it  This test is not yet approved or cleared by the [place]  FDA and has been authorized for detection and/or diagnosis of SARS-CoV-2 by FDA under an Emergency Use Authorization (EUA). This EUA will remain in effect (meaning this test can be used) for the duration of the COVID-19 declaration under Section  564(b)(1) of the Act, 21 U.S.C. section 360bbb-3(b)(1), unless the authorization is terminated or revoked.  Performed at [facility], [address] [place], [place] [address]   Respiratory (~20 pathogens) panel by PCR     Status: None   Collection Time: 11/09/23  5:31 PM   Specimen: Nasopharyngeal Swab; Respiratory  Result Value Ref Range Status   Adenovirus NOT DETECTED NOT DETECTED Final   Coronavirus 229E NOT DETECTED NOT DETECTED Final    Comment: (NOTE) The Coronavirus on the Respiratory Panel, DOES NOT test for the novel  Coronavirus (2019 nCoV)    Coronavirus HKU1 NOT DETECTED NOT DETECTED Final   Coronavirus NL63 NOT DETECTED NOT DETECTED Final   Coronavirus OC43 NOT DETECTED NOT DETECTED Final   Metapneumovirus NOT DETECTED NOT DETECTED Final   Rhinovirus / Enterovirus NOT DETECTED NOT DETECTED Final   Influenza A NOT DETECTED NOT DETECTED Final   Influenza B NOT DETECTED NOT DETECTED Final   Parainfluenza Virus 1 NOT DETECTED NOT DETECTED Final   Parainfluenza Virus 2 NOT DETECTED NOT DETECTED Final   Parainfluenza Virus 3 NOT DETECTED NOT DETECTED Final   Parainfluenza Virus 4 NOT DETECTED NOT DETECTED Final   Respiratory Syncytial Virus NOT DETECTED NOT DETECTED Final   Bordetella pertussis NOT DETECTED NOT DETECTED Final   Bordetella Parapertussis NOT DETECTED NOT  DETECTED Final   Chlamydophila pneumoniae NOT DETECTED NOT DETECTED Final   Mycoplasma pneumoniae NOT DETECTED NOT DETECTED Final    Comment: Performed at [facility], [address] [place], [place] [address]  Culture, blood (Routine X 2) w Reflex to ID Panel     Status: None   Collection Time: 11/09/23  6:16 PM   Specimen: BLOOD  Result Value Ref Range Status   Specimen Description   Final    BLOOD RIGHT ANTECUBITAL Performed at [facility], [address] [place], [place] [address]    Special Requests   Final    BOTTLES DRAWN AEROBIC ONLY Blood Culture adequate  volume Performed at [facility], [address] [place], [place] [address]    Culture   Final    NO GROWTH 11 DAYS Performed at [facility], [address], [place], [place] [address]    Report Status 11/20/2023 FINAL  Final  Culture, blood (Routine X 2) w Reflex to ID Panel     Status: None   Collection Time: 11/09/23  6:23 PM   Specimen: BLOOD  Result Value Ref Range Status   Specimen Description   Final    BLOOD BLOOD LEFT FOREARM Performed at [facility], [address], [place], [place] [address]    Special Requests   Final    BOTTLES DRAWN AEROBIC AND ANAEROBIC Blood Culture adequate volume Performed at [facility], [address], [place], [place] [address]    Culture   Final    NO GROWTH 11 DAYS Performed at [facility], [address] [place], [place] [address]    Report Status 11/20/2023 FINAL  Final  Urine Culture     Status: Abnormal   Collection Time: 11/10/23  1:58 AM   Specimen: Urine, Random  Result Value Ref Range Status   Specimen Description   Final    URINE, RANDOM Performed at [facility], [address] [place], [place] [address]    Special Requests   Final    NONE Reflexed from [id number] Performed at [facility], [address] [place], [place] [address]    Culture 60,000 COLONIES/mL ENTEROCOCCUS FAECALIS (A)  Final   Report Status 11/12/2023 FINAL  Final   Organism ID, Bacteria ENTEROCOCCUS FAECALIS (A)  Final      Susceptibility   Enterococcus faecalis - MIC*    AMPICILLIN  <=2 SENSITIVE Sensitive     NITROFURANTOIN <=16 SENSITIVE Sensitive     VANCOMYCIN  1 SENSITIVE Sensitive     * 60,000 COLONIES/mL ENTEROCOCCUS FAECALIS  Culture, blood (x 2)     Status: None   Collection Time: 11/25/23 10:31 PM   Specimen: BLOOD  Result Value Ref Range Status   Specimen Description BLOOD BLOOD LEFT ARM  Final   Special Requests   Final    BOTTLES DRAWN AEROBIC AND ANAEROBIC Blood  Culture adequate volume   Culture   Final    NO GROWTH 5 DAYS Performed at [facility], [address] [place], [place] [address]    Report Status 11/30/2023 FINAL  Final  Culture, blood (x 2)     Status: None   Collection Time: 11/25/23 10:31 PM   Specimen: BLOOD  Result Value Ref Range Status   Specimen Description BLOOD BLOOD RIGHT ARM  Final   Special Requests   Final    BOTTLES DRAWN AEROBIC AND ANAEROBIC Blood Culture adequate volume   Culture  Final    NO GROWTH 5 DAYS Performed at [facility], [address] [place], [place] [address]    Report Status 11/30/2023 FINAL  Final  Gastrointestinal Panel by PCR , Stool     Status: None   Collection Time: 11/30/23  5:10 PM   Specimen: Stool  Result Value Ref Range Status   Campylobacter species NOT DETECTED NOT DETECTED Final   Plesimonas shigelloides NOT DETECTED NOT DETECTED Final   Salmonella species NOT DETECTED NOT DETECTED Final   Yersinia enterocolitica NOT DETECTED NOT DETECTED Final   Vibrio species NOT DETECTED NOT DETECTED Final   Vibrio cholerae NOT DETECTED NOT DETECTED Final   Enteroaggregative E coli (EAEC) NOT DETECTED NOT DETECTED Final   Enteropathogenic E coli (EPEC) NOT DETECTED NOT DETECTED Final   Enterotoxigenic E coli (ETEC) NOT DETECTED NOT DETECTED Final   Shiga like toxin producing E coli (STEC) NOT DETECTED NOT DETECTED Final   Shigella/Enteroinvasive E coli (EIEC) NOT DETECTED NOT DETECTED Final   Cryptosporidium NOT DETECTED NOT DETECTED Final   Cyclospora cayetanensis NOT DETECTED NOT DETECTED Final   Entamoeba histolytica NOT DETECTED NOT DETECTED Final   Giardia lamblia NOT DETECTED NOT DETECTED Final   Adenovirus F40/41 NOT DETECTED NOT DETECTED Final   Astrovirus NOT DETECTED NOT DETECTED Final   Norovirus GI/GII NOT DETECTED NOT DETECTED Final   Rotavirus A NOT DETECTED NOT DETECTED Final   Sapovirus (I, II, IV, and V) NOT DETECTED NOT DETECTED Final    Comment:  Performed at [facility], [address] [place], [place] [address]    Coagulation Studies: No results for input(s): LABPROT, INR in the last 72 hours.   Urinalysis: No results for input(s): COLORURINE, LABSPEC, PHURINE, GLUCOSEU, HGBUR, BILIRUBINUR, KETONESUR, PROTEINUR, UROBILINOGEN, NITRITE, LEUKOCYTESUR in the last 72 hours.  Invalid input(s): APPERANCEUR     Imaging: No results found.       Medications:    albumin  human 25 g (11/29/23 1106)   piperacillin -tazobactam (ZOSYN )  IV 2.25 g (12/01/23 0548)    sodium chloride    Intravenous Once   Chlorhexidine  Gluconate Cloth  6 each Topical Q0600   epoetin  alfa-epbx (RETACRIT ) injection  10,000 Units Intravenous Q M,W,F-HD   feeding supplement (NEPRO CARB STEADY)  237 mL Per Tube 5 X Daily   feeding supplement (PROSource TF20)  60 mL Per Tube Daily   ferrous sulfate   325 mg Per Tube Daily   fiber supplement (BANATROL TF)  60 mL Per Tube BID   free water   60 mL Per Tube 5 X Daily   gentamicin  ointment   Topical TID   heparin  injection (subcutaneous)  5,000 Units Subcutaneous Q8H   insulin  aspart  10 Units Subcutaneous Once   lactobacillus  1 g Per Tube TID WC   midodrine   5 mg Per Tube TID WC   multivitamin  1 tablet Per Tube QHS   pantoprazole  (PROTONIX ) IV  40 mg Intravenous Q12H   polyethylene glycol  17 g Per Tube Daily   acetaminophen  **OR** acetaminophen , albumin  human, artificial tears, bisacodyl , ipratropium-albuterol , [DISCONTINUED] ondansetron  **OR** ondansetron  (ZOFRAN ) IV, mouth rinse  Assessment/ Plan:  Mr. Robert Vega is a 83 y.o.  male with obstructive sleep apnea, GERD, obesity, hypertension, dysphagia, severe esophageal dysmotility with achalasia status post PEG tube placement, recent aspiration pneumonia acute respiratory failure status post extubation 10/09/2023, who was admitted to [facility] on 10/05/2023 for Aspiration into respiratory tract, initial encounter  [T17.908A] Severe sepsis (HCC) [A41.9, R65.20] History  of dysphagia [Z87.898] Fever, unspecified fever cause [R50.9] Multifocal pneumonia [J18.9]   1.  Acute kidney injury. Requiring hemodialysis. With obstructive uropathy Baseline creatinine 0.81 from 10/14/2023.  Acute kidney injury secondary to ATN, obstructive uropathy, and progression of prerenal azotemia.   Patient is critically ill.  He was started on urgent hemodialysis for severe hyperkalemia and uremia. Vascular surgery placed PermCath on 6/26.  - TOC dialysis coordinator notified of need for outpatient dialysis clinic.  Search in progress. - Patient seen and examined on hemodialysis treatment. Patient to continue MWF schedule.  - requiring IV albumin  with dialysis treatments for blood pressure support. - will discuss with family. Overall prognosis is poor   2.  Sepsis and hypotension due to aspiration pneumonia, found to have distal esophageal obstruction with severe dysmotility.  He is status post botulinum toxin injection to the lower esophageal sphincter.  PEG tube placed this admission. Receiving continuous tube feeds with scheduled free water  flushes.  - Continue midodrine   4.Anemia in the setting of renal failure Lab Results  Component Value Date   HGB 8.1 (L) 12/01/2023  - EPO with HD treatments.   5.  Bilateral hydronephrosis Noted on CT.Urology team has evaluated the patient and it is thought to be secondary to reflux.  Due to health, further intervention deferred at this time.  Discussed case with patient's granddaughter, Robert Vega.      LOS: 57 Thaddus Mcdowell 7/6/202512:56 PM

## 2023-12-01 NOTE — Progress Notes (Signed)
 Progress Note   Patient: Robert Vega FMW:[id number] DOB: 06-12-1940 DOA: 10/05/2023     57 DOS: the patient was seen and examined on 12/01/2023   Brief hospital course:  HPI was taken from Dr. Eldonna: Robert Vega is a 84 y.o. male with medical history significant of hypertension, sleep apnea, obesity, GERD presenting with acute febrile hypoxia, sepsis  and pneumonia.  Patient noted to be overall poor historian.  Per report, patient with increased work of breathing generalized weakness over the past 12 to 24 hours.  Was initially evaluated urgent care however EMS had to be called.  No severe weakness.  Mild cough per report.  No chest pain or abdominal pain.  No reported nausea or vomiting.  Seen by EMS with noted fever 100.7 and route.  Hypoxic to the mid 80s on room air. Presented to the ER Tmax 22.1, heart rate 90s, respirations mid 20s, BP 80s to 130s.  Requiring 4 L nasal cannula to keep O2 sats greater than 92%.  White count 7.7, hemoglobin 12, platelets 216, lactate 2.2.  COVID flu RSV negative.  Creatinine 1.  Glucose 152.  Chest x-ray with bilateral lower lobe pneumonia.  Positive pulmonary vascular congestion.   As per Dr. Lenon: Robert Vega is a 83 y.o male with significant PMH of OSA, GERD, Obesity, HTN, Dysphagia - presented to the ED 10/05/2023 from with hypoxia, fever and generalized weakness. Dx sepsis/pneumonia, concern for aspiration    Of note, EGD 03/2022 with food in upper esophagus complicated by aspiration event, cardiac arrest with round of CPR, and post resuscitation EGD with concern for lack of peristalsis. Hx prior EGD 08/2020 with note of abnormal cricopharyngeus, decrease in motility in esophagus, and spastic LES    5/10: Admit to [facility] service with sepsis due to Aspiration Pneumonia.  Course complicated by Acute Respiratory Failure due to Aspiration of vomitus w/ cardiac arrest transfer to ICU and intubation.  5/11: flexible bronchoscopy  5/13: PEG tube,  +vomiting last night, failed SAT/SBT 5/14: extubated but with severe delirium 5/20:  Botulinum toxin injection into the lower esophageal sphincter by Dr. Jinny 5/21: esophagram showing diffusely distended esophagus with distal obstruction and severe dysmotility suggestive of achalasia. At that point patient was stabilized to point of pursuing SNF placement but then with complications of PEG tube placement resulted in return to OR 5/27 and tube feeds restarted 5/28 with zosyn  for peritonitis.  Had new melena 6/9 so underwent another EGD without acute bleeding seen.  Renal function declined and nephrology was consulted and without improvement with conservative management, he received dialysis access and started on HD 6/16 as this was in line with family goals of care.  Discharge will now be pending arrangement of facility and outpatient HD if he does not have renal recovery.  He remains in poor prognosis state with intermittently needing to hold tube feeds again for intolerance and needed blood transfusion 6/18 for anemia likely related more to the renal failure as no source of bleeding has been identified thus far. Mental status is oriented to self and location and able to follow simple commands but otherwise confused. PT/OT recommends SNF. Permcath placement scheduled for this am.     As per Dr. Trudy 6/28-11/26/23: Pt is very sick. Overnight, 06/30 pt spiked a low grade fever and was started on IV broad sprectrum abxs, IV flagyl , vanco, cefepime . CT chest/abd/pelvis ordered. Pt's granddaughter is requesting ID consult. Please consult ID in AM as per granddaughter's request. Pt is  full code and full scope of care despite pt having failure to thrive.        07/02 -called and updated patient's granddaughter [name] who had questions about her grandfather's condition and treatment plan.  She verbalized understanding and agreement with plan.   07/03 - called and updated patient's granddaughter  [name] on her grandfathers condition and treatment plan.  All questions and concerns were addressed.     07/05 -patient's granddaughter was updated.  All questions and concerns have been addressed.       Assessment and Plan:  Failure to thrive: secondary to all below. Continue w/ full code, full scope of care as per pt's granddaughter.    SIRS: Suspect secondary to aspiration pneumonia started on night on 11/25/23. W/ leukocytosis, tachypnea, fever and unknown source.  Started initially on IV broad sprectrum abxs, IV flagyl , vanco, cefepime .  Patient had a CT scan of the chest/abdomen/pelvis which showed moderate bilateral hydronephrosis and hydroureter to the ureterovesicular junction without calculus or other visible obstruction likely secondary to ureterovesicular junction due to bladder wall edema. Interval increase in extensive bilateral simple attenuation perinephric fluid. Pleural effusion similar to prior examination.  Anasarca. Cholelithiasis. Percutaneous gastrostomy. Coronary artery disease. Patient noted to have worsening leukocytosis.  Appreciate ID input Recommends to obtain UA if possible and to continue broad-spectrum antibiotics. Straight cath was attempted and was unsuccessful. Appreciate ID input, recommends to treat patient with IV Zosyn  for suspected aspiration pneumonia for 7 days (end date 07/10).  Leukocytosis shows a downward trend We will discontinue cefepime , Vanco and Flagyl  Blood cultures have been sterile for  5 days.       AKI:  Hyperkalemia: Managed by renal replacement therapy Secondary to ATN as per nephro.  On HD MWF.  Renal replacement therapy was discontinued  early b/c of hypotension & bradycardia. Nephro following and recs apprec        Possible ileus:  Patient with loose stools most likely related to tube feeds Will add Bananatrol      Anemia of chronic kidney disease likely secondary to CKD. S/p 2 units of pRBCs transfused so far.   Continue iron supplementation Hemoglobin is stable     Achalasia & dysphagia: w/ high aspiration risk.  S/p botulinum toxin injection on 5/20, several EGD and SLP evals.  Not safe for PO intake unless family wants to accept risk that he will aspirate again. Continue w/ tube feeds      Severe sepsis: resolved. Initially w/ aspiration pna. Later in hospital stay with development of  intra-abdominal infection secondary to PEG dislodged.   Acute hypoxic & hypercapnic respiratory failure:  s/p intubation, ventilation & extubation.  Continue on supplemental oxygen and wean as tolerated     Acute on chronic diastolic dysfunction CHF  XR showed interstitial edema/CHF.  Fluid/volume management w/ HD      Delirium: vs mild cognitive impairment vs anoxic brain injury during critical illness in the ICU, requiring intubation & pressors. Continue w/ supportive care. Granddaughter asked to d/c prn antipsychotics. Continue w/ supportive care    GERD: continue on PPI    HTN: BP is on the low end of normal     Generalized weakness: PT/OT recs SNF   Obesity: BMI 32.8.   Moderate malnutrition Secondary to acute illness Continue tube feeds    Dislodged PEG tube (11/28/23) Patient noted to have dislodged PEG tube Imaging showed  It appears likely that the retention balloon has migrated distally. Recommend retraction of the gastrostomy  tube until the retention balloon is felt at the level of the anterior gastric wall. Appreciate GI input.  PEG tube fixed          Subjective: No new complaints.  Lethargic but opens eyes to verbal stimuli  Physical Exam: Vitals:   11/30/23 1708 11/30/23 2016 12/01/23 0408 12/01/23 0815  BP: (!) 148/79 (!) 111/59 120/66 (!) 115/50  Pulse: 85 69 77 79  Resp: 16 20  16   Temp: (!) 97.5 F (36.4 C) 98.6 F (37 C) 98.2 F (36.8 C) 97.6 F (36.4 C)  TempSrc: Oral Axillary  Oral  SpO2: 96% 99% 98% 98%  Weight:      Height:       General exam:  Lying in bed, Lethargic  Respiratory system: decreased breath sounds b/l  Cardiovascular system: S1 & S2+. No rubs or clicks    Gastrointestinal system: abd is soft, tender RLQ, obese, hypoactive bowel sounds  Central nervous system: Lethargic Psychiatry: Unable to assess    Data Reviewed: White count 18.7, hemoglobin 8.1, platelet count 463 Labs reviewed  Family Communication: None  Disposition: Status is: Inpatient Remains inpatient appropriate because: On IV antibiotics for aspiration pneumonia  Planned Discharge Destination: Skilled nursing facility    Time spent: 45 minutes  Author: Aimee Somerset, MD 12/01/2023 1:29 PM  For on call review [email or web address].

## 2023-12-02 DIAGNOSIS — R4182 Altered mental status, unspecified: Secondary | ICD-10-CM | POA: Diagnosis not present

## 2023-12-02 DIAGNOSIS — J690 Pneumonitis due to inhalation of food and vomit: Secondary | ICD-10-CM | POA: Diagnosis not present

## 2023-12-02 LAB — CBC
HCT: 24.8 % — ABNORMAL LOW (ref 39.0–52.0)
Hemoglobin: 7.6 g/dL — ABNORMAL LOW (ref 13.0–17.0)
MCH: 28.5 pg (ref 26.0–34.0)
MCHC: 30.6 g/dL (ref 30.0–36.0)
MCV: 92.9 fL (ref 80.0–100.0)
Platelets: 513 K/uL — ABNORMAL HIGH (ref 150–400)
RBC: 2.67 MIL/uL — ABNORMAL LOW (ref 4.22–5.81)
RDW: 15.9 % — ABNORMAL HIGH (ref 11.5–15.5)
WBC: 18.4 K/uL — ABNORMAL HIGH (ref 4.0–10.5)
nRBC: 0.1 % (ref 0.0–0.2)

## 2023-12-02 LAB — BASIC METABOLIC PANEL WITH GFR
Anion gap: 18 — ABNORMAL HIGH (ref 5–15)
BUN: 96 mg/dL — ABNORMAL HIGH (ref 8–23)
CO2: 22 mmol/L (ref 22–32)
Calcium: 7.9 mg/dL — ABNORMAL LOW (ref 8.9–10.3)
Chloride: 98 mmol/L (ref 98–111)
Creatinine, Ser: 7.56 mg/dL — ABNORMAL HIGH (ref 0.61–1.24)
GFR, Estimated: 7 mL/min — ABNORMAL LOW (ref >60)
Glucose, Bld: 140 mg/dL — ABNORMAL HIGH (ref 70–99)
Potassium: 3.6 mmol/L (ref 3.5–5.1)
Sodium: 138 mmol/L (ref 135–145)

## 2023-12-02 LAB — VITAMIN D 25 HYDROXY (VIT D DEFICIENCY, FRACTURES): Vit D, 25-Hydroxy: 42.17 ng/mL (ref 30–100)

## 2023-12-02 LAB — GLUCOSE, CAPILLARY
Glucose-Capillary: 138 mg/dL — ABNORMAL HIGH (ref 70–99)
Glucose-Capillary: 119 mg/dL — ABNORMAL HIGH (ref 70–99)
Glucose-Capillary: 122 mg/dL — ABNORMAL HIGH (ref 70–99)
Glucose-Capillary: 149 mg/dL — ABNORMAL HIGH (ref 70–99)

## 2023-12-02 MED ORDER — HEPARIN SODIUM (PORCINE) 1000 UNIT/ML IJ SOLN
INTRAMUSCULAR | Status: AC
  Filled 2023-12-02: qty 10

## 2023-12-02 MED ORDER — EPOETIN ALFA-EPBX 10000 UNIT/ML IJ SOLN
INTRAMUSCULAR | Status: AC
  Filled 2023-12-02: qty 1

## 2023-12-02 NOTE — Progress Notes (Signed)
  Received patient in bed to unit.   Informed consent signed and in chart.    TX duration:3.0     Transported by  Hand-off given to patient's nurse. Pt tolerated tx well.   Access used: Cath Access issues: none   Total UF removed: 2.0 Medication(s) given: Epo 10,000 Post HD VS: wnl Pt came in chair.     GEANNIE Sar LPN [facility]

## 2023-12-02 NOTE — TOC Progression Note (Addendum)
 Transition of Care [facility]) - Progression Note    Patient Details  Name: Robert Vega MRN: [id number] Date of Birth: 01/29/1941  Transition of Care [facility]) CM/SW Contact  Lauraine [name], LCSW Phone Number: 12/02/2023, 8:52 AM  Clinical Narrative:   [facility] cannot offer a bed. Left messages for [facility] and [facility] to see if they can offer a bed. [facility] has offered.  3:18 pm: [facility] is reviewing referral. No response yet from [facility]. Emailed granddaughter and son to update.    Barriers to Discharge: Continued Medical Work up  Expected Discharge Plan and Services                                               Social Determinants of Health (SDOH) Interventions SDOH Screenings   Food Insecurity: Patient Unable To Answer (10/06/2023)  Recent Concern: Food Insecurity - Food Insecurity Present (09/11/2023)   Received from [facility]  Housing: Patient Unable To Answer (10/06/2023)  Recent Concern: Housing - High Risk (09/11/2023)   Received from [facility]  Transportation Needs: Patient Unable To Answer (10/06/2023)  Utilities: Patient Unable To Answer (10/06/2023)  Depression (PHQ2-9): Low Risk  (05/07/2023)  Financial Resource Strain: Medium Risk (09/11/2023)   Received from [facility]  Social Connections: Unknown (10/06/2023)  Tobacco Use: Medium Risk (11/05/2023)    Readmission Risk Interventions     No data to display

## 2023-12-02 NOTE — Progress Notes (Signed)
 Nutrition Follow Up Note   DOCUMENTATION CODES:   Non-severe (moderate) malnutrition in context of acute illness/injury  INTERVENTION:   Continue Nepro- Give one carton five times daily via tube- Flush with 30ml of water  before and after each feed.   ProSource TF 20- Give 60ml daily via tube, each supplement provides 80kcal and 20g of protein.   Regimen provides 2180kcal/day, 115g/day protein and 1136ml/day of free water .   Rena-vit daily via tube  Daily weights   NUTRITION DIAGNOSIS:   Moderate Malnutrition related to acute illness as evidenced by mild fat depletion, mild muscle depletion, moderate muscle depletion, percent weight loss. -ongoing   GOAL:   Patient will meet greater than or equal to 90% of their needs -met  MONITOR:   Diet advancement, Labs, Weight trends, TF tolerance, I & O's, Skin  ASSESSMENT:   83 y/o male with h/o GERD, esophageal dysphagia, HTN, BPH, mood disorder and hiatal hernia who is admitted with aspiration event, PNA, sepsis, cardiac arrest and dysphagia.  Pt tolerating bolus feeds at goal rate. Pt is noted to have loose stools; banatrol and lactobacillus initiated by MD. Pt has continued to get miralax . Pt initiated on ferrous sulfate  via tube. Refeed labs stable. Pt continues on HD. Per chart, pt is fairly weight stable. Plan is for SNF at discharge.   Medications reviewed and include: epoetin , ferrous sulfate , heparin , lactobacillus, midodrine , insulin , rena-vit, protonix , albumin , zosyn    Labs reviewed: K 3.6 wnl, BUN 96(H), creat 7.56H) Wbc- 18.4(H), Hgb 7.6(L), Hct 24.8(L) Cbgs- 119, 138 x 24 hrs   Diet Order:   Diet Order             Diet NPO time specified Except for: Ice Chips  Diet effective now                  EDUCATION NEEDS:   No education needs have been identified at this time  Skin:  Skin Assessment: Reviewed RN Assessment (closed abdominal incision, old skin tears)  Last BM:  7/7- TYPE 7  Height:   Ht  Readings from Last 1 Encounters:  10/08/23 5' 9.02 (1.753 m)    Weight:   Wt Readings from Last 1 Encounters:  11/29/23 95.3 kg    Ideal Body Weight:  72.7 kg  BMI:  Body mass index is 31.01 kg/m.  Estimated Nutritional Needs:   Kcal:  2000-2300kcal/day  Protein:  100-115g/day  Fluid:  UOP +1L  Augustin Shams MS, RD, LDN If unable to be reached, please send secure chat to RD inpatient available from 8:00a-4:00p daily

## 2023-12-02 NOTE — Plan of Care (Signed)
  Problem: Clinical Measurements: Goal: Ability to maintain clinical measurements within normal limits will improve Outcome: Progressing   Problem: Clinical Measurements: Goal: Diagnostic test results will improve Outcome: Progressing   Problem: Clinical Measurements: Goal: Respiratory complications will improve Outcome: Progressing   Problem: Nutrition: Goal: Adequate nutrition will be maintained Outcome: Progressing   Problem: Safety: Goal: Ability to remain free from injury will improve Outcome: Progressing

## 2023-12-02 NOTE — Progress Notes (Signed)
 Progress Note   Patient: Robert Vega FMW:[id number] DOB: August 02, 1940 DOA: 10/05/2023     58 DOS: the patient was seen and examined on 12/02/2023   Brief hospital course:  HPI was taken from Dr. Eldonna: Robert Vega is a 83 y.o. male with medical history significant of hypertension, sleep apnea, obesity, GERD presenting with acute febrile hypoxia, sepsis  and pneumonia.  Patient noted to be overall poor historian.  Per report, patient with increased work of breathing generalized weakness over the past 12 to 24 hours.  Was initially evaluated urgent care however EMS had to be called.  No severe weakness.  Mild cough per report.  No chest pain or abdominal pain.  No reported nausea or vomiting.  Seen by EMS with noted fever 100.7 and route.  Hypoxic to the mid 80s on room air. Presented to the ER Tmax 22.1, heart rate 90s, respirations mid 20s, BP 80s to 130s.  Requiring 4 L nasal cannula to keep O2 sats greater than 92%.  White count 7.7, hemoglobin 12, platelets 216, lactate 2.2.  COVID flu RSV negative.  Creatinine 1.  Glucose 152.  Chest x-ray with bilateral lower lobe pneumonia.  Positive pulmonary vascular congestion.   As per Dr. Lenon: Robert Vega is a 83 y.o male with significant PMH of OSA, GERD, Obesity, HTN, Dysphagia - presented to the ED 10/05/2023 from with hypoxia, fever and generalized weakness. Dx sepsis/pneumonia, concern for aspiration    Of note, EGD 03/2022 with food in upper esophagus complicated by aspiration event, cardiac arrest with round of CPR, and post resuscitation EGD with concern for lack of peristalsis. Hx prior EGD 08/2020 with note of abnormal cricopharyngeus, decrease in motility in esophagus, and spastic LES    5/10: Admit to [facility] service with sepsis due to Aspiration Pneumonia.  Course complicated by Acute Respiratory Failure due to Aspiration of vomitus w/ cardiac arrest transfer to ICU and intubation.  5/11: flexible bronchoscopy  5/13: PEG tube,  +vomiting last night, failed SAT/SBT 5/14: extubated but with severe delirium 5/20:  Botulinum toxin injection into the lower esophageal sphincter by Dr. Jinny 5/21: esophagram showing diffusely distended esophagus with distal obstruction and severe dysmotility suggestive of achalasia. At that point patient was stabilized to point of pursuing SNF placement but then with complications of PEG tube placement resulted in return to OR 5/27 and tube feeds restarted 5/28 with zosyn  for peritonitis.  Had new melena 6/9 so underwent another EGD without acute bleeding seen.  Renal function declined and nephrology was consulted and without improvement with conservative management, he received dialysis access and started on HD 6/16 as this was in line with family goals of care.  Discharge will now be pending arrangement of facility and outpatient HD if he does not have renal recovery.  He remains in poor prognosis state with intermittently needing to hold tube feeds again for intolerance and needed blood transfusion 6/18 for anemia likely related more to the renal failure as no source of bleeding has been identified thus far. Mental status is oriented to self and location and able to follow simple commands but otherwise confused. PT/OT recommends SNF. Permcath placement scheduled for this am.     As per Dr. Trudy 6/28-11/26/23: Pt is very sick. Overnight, 06/30 pt spiked a low grade fever and was started on IV broad sprectrum abxs, IV flagyl , vanco, cefepime . CT chest/abd/pelvis ordered. Pt's granddaughter is requesting ID consult. Please consult ID in AM as per granddaughter's request. Pt is  full code and full scope of care despite pt having failure to thrive.        07/02 -called and updated patient's granddaughter Robert Vega who had questions about her grandfather's condition and treatment plan.  She verbalized understanding and agreement with plan.   07/03 - called and updated patient's granddaughter  Robert Vega on her grandfathers condition and treatment plan.  All questions and concerns were addressed.     07/05 -patient's granddaughter was updated.  All questions and concerns have been addressed.       Assessment and Plan:  Failure to thrive: secondary to all below. Continue w/ full code, full scope of care as per pt's granddaughter.    SIRS: Suspect secondary to aspiration pneumonia started on night on 11/25/23. W/ leukocytosis, tachypnea, fever and unknown source.  Started initially on IV broad sprectrum abxs, IV flagyl , vanco, cefepime .  Patient had a CT scan of the chest/abdomen/pelvis which showed moderate bilateral hydronephrosis and hydroureter to the ureterovesicular junction without calculus or other visible obstruction likely secondary to ureterovesicular junction due to bladder wall edema. Interval increase in extensive bilateral simple attenuation perinephric fluid. Pleural effusion similar to prior examination.  Anasarca.   Patient noted to have worsening leukocytosis.  Appreciate ID input Recommends to obtain UA if possible and to continue broad-spectrum antibiotics. Straight cath was attempted and was unsuccessful. Recommended to treat patient with IV Zosyn  for suspected aspiration pneumonia for 7 days (end date 07/10).  Leukocytosis stably elevated.        AKI with delayed recovery on HD:  Hyperkalemia: Managed by renal replacement therapy Secondary to ATN as per nephro.  On HD MWF.      Possible ileus:  Patient with loose stools most likely related to tube feeds  Bananatrol Added, stop bowel regimen    Normocytic anemia  Iron deficiency Stable.  Possible component of anemia of CKD.  S/p 2 units of pRBCs transfused so far.  Iron/TIBC/Ferritin/ %Sat    Component Value Date/Time   IRON 21 (L) 11/07/2023 0445   TIBC 258 11/07/2023 0445   FERRITIN 228 11/07/2023 0445   IRONPCTSAT 8 (L) 11/07/2023 0445    Continue iron supplementation Hemoglobin is  stable     Achalasia & dysphagia: w/ high aspiration risk.  S/p botulinum toxin injection on 5/20, several EGD and SLP evals.  Not safe for PO intake unless family wants to accept risk of aspiration. Continue with tube feeds   Severe sepsis: resolved. Initially w/ aspiration pna. Later in hospital stay with development of  intra-abdominal infection secondary to PEG dislodged.   Acute hypoxic & hypercapnic respiratory failure: Resolved s/p intubation, ventilation & extubation.  Continue on supplemental oxygen and wean as tolerated     Acute on chronic diastolic dysfunction CHF  XR showed interstitial edema/CHF.  Fluid/volume management w/ HD      Delirium: vs mild cognitive impairment vs anoxic brain injury during critical illness in the ICU, requiring intubation & pressors.  Continue w/ supportive care. Granddaughter asked to d/c prn antipsychotics. Continue w/ supportive care. F/u MRI brain    GERD: continue on PPI    HTN: BP is on the low end of normal     Generalized weakness: PT/OT recs SNF   Obesity: BMI 32.8.   Moderate malnutrition Secondary to acute illness Continue tube feeds    Dislodged PEG tube (11/28/23) Patient noted to have dislodged PEG tube Imaging showed  It appears likely that the retention balloon has migrated distally. Recommend retraction of  the gastrostomy tube until the retention balloon is felt at the level of the anterior gastric wall. Appreciate GI input.  PEG tube fixed          Subjective: No new complaints.  Lethargic but opens eyes to verbal stimuli  Physical Exam: Vitals:   12/02/23 1700 12/02/23 1714 12/02/23 1726 12/02/23 1803  BP: 107/67 (!) 112/57  (!) 118/50  Pulse: 86 85 90 82  Resp: (!) 28 (!) 24 (!) 30 19  Temp:  98.8 F (37.1 C)  98.6 F (37 C)  TempSrc:  Axillary  Oral  SpO2: 100% 100%  100%  Weight:   94.3 kg   Height:       Physical Exam  Constitutional: In no distress.  Cardiovascular: Normal rate,  regular rhythm.  Pulmonary: Non labored breathing on room air, no wheezing or rales.  No lower extremity edema Abdominal: Soft. Normal bowel sounds. Non distended  Musculoskeletal: Normal range of motion.     Neurological: Awake.  Not able to assess Skin: Skin is warm and dry.     Data Reviewed:    Latest Ref Rng & Units 12/02/2023   10:40 AM 12/01/2023   10:06 AM 11/30/2023    9:11 AM  CBC  WBC 4.0 - 10.5 K/uL 18.4  18.7  20.3   Hemoglobin 13.0 - 17.0 g/dL 7.6  8.1  7.2   Hematocrit 39.0 - 52.0 % 24.8  26.6  23.0   Platelets 150 - 400 K/uL 513  463  401      Family Communication: None  Disposition: Status is: Inpatient Remains inpatient appropriate because: On IV antibiotics for aspiration pneumonia  Planned Discharge Destination: Skilled nursing facility    Time spent: 35 minutes  Author: Alban Pepper, MD 12/02/2023 7:06 PM  For on call review [email or web address].

## 2023-12-02 NOTE — Progress Notes (Signed)
 [name] Nose, Karna Nose, [place]ford Nose, Ernistine Chrismon, [name] Galantine, [place], Vernette Zimmerman. List updated on 7/7

## 2023-12-02 NOTE — Progress Notes (Signed)
 [facility]  ROUNDING NOTE   Subjective:   Patient laying in bed No family present Unresponsive to voice Will track around room   Objective:  Vital signs in last 24 hours:  Temp:  [97.6 F (36.4 C)-98 F (36.7 C)] 98 F (36.7 C) (07/07 1350) Pulse Rate:  [72-83] 82 (07/07 1410) Resp:  [16-28] 17 (07/07 1410) BP: (116-148)/(55-83) 141/68 (07/07 1410) SpO2:  [95 %-99 %] 99 % (07/07 1410)  Weight change:  Filed Weights   11/27/23 2038 11/29/23 0830 11/29/23 1230  Weight: 96.9 kg 96.8 kg 95.3 kg    Intake/Output: I/O last 3 completed shifts: In: 1024 [NG/GT:474; IV Piggyback:550] Out: 0    Intake/Output this shift:  Total I/O In: 60 [Other:60] Out: -   Physical Exam: General: Ill appearing, laying in bed  Head: Oral mucosa moist  Eyes: Anicteric  Lungs:  Diminished  Heart: Regular rate and rhythm  Abdomen:  Peg tube in place, +distended  Extremities: no peripheral edema.  Neurologic: Alert  Skin: No lesions  Access: Rt internal jugular permcath    Basic Metabolic Panel: Recent Labs  Lab 11/27/23 0452 11/28/23 0931 11/29/23 0825 11/29/23 0840 11/30/23 0911 12/01/23 1006 12/02/23 1040  NA 132*   < > 133* 131* 135 137 138  K 5.2*   < > 4.3 4.2 4.3 4.2 3.6  CL 95*   < > 94* 92* 93* 98 98  CO2 23   < > 26 24 26 24 22   GLUCOSE 97   < > 125* 123* 132* 121* 140*  BUN 99*   < > 96* 94* 60* 83* 96*  CREATININE 7.41*   < > 6.65* 6.74* 4.50* 5.92* 7.56*  CALCIUM  7.8*   < > 7.6* 7.5* 7.6* 8.0* 7.9*  MG 2.7*  --   --   --   --   --   --   PHOS 7.3*  --   --  5.0*  --   --   --    < > = values in this interval not displayed.    Liver Function Tests: Recent Labs  Lab 11/25/23 2158 11/29/23 0840  AST 19  --   ALT 13  --   ALKPHOS 77  --   BILITOT 0.2  --   PROT 6.7  --   ALBUMIN  1.8* 1.7*   No results for input(s): LIPASE, AMYLASE in the last 168 hours. No results for input(s): AMMONIA in the last 168 hours.  CBC: Recent Labs   Lab 11/25/23 2158 11/26/23 0409 11/28/23 0931 11/29/23 0825 11/30/23 0911 12/01/23 1006 12/02/23 1040  WBC 20.9*  20.8*   < > 25.4* 24.9* 20.3* 18.7* 18.4*  NEUTROABS 17.0*  --  21.4* 20.1*  --   --   --   HGB 7.6*  7.5*   < > 7.2* 7.4* 7.2* 8.1* 7.6*  HCT 23.5*  23.3*   < > 22.4* 23.7* 23.0* 26.6* 24.8*  MCV 91.4  91.4   < > 90.3 92.9 92.0 94.0 92.9  PLT 350  332   < > 400 441* 401* 463* 513*   < > = values in this interval not displayed.    Cardiac Enzymes: No results for input(s): CKTOTAL, CKMB, CKMBINDEX, TROPONINI in the last 168 hours.  BNP: Invalid input(s): POCBNP  CBG: Recent Labs  Lab 12/01/23 1155 12/01/23 1629 12/01/23 2052 12/02/23 0803 12/02/23 1229  GLUCAP 156* 138* 101* 138* 119*    Microbiology: Results for orders placed or  performed during the hospital encounter of 10/05/23  Blood Culture (routine x 2)     Status: None   Collection Time: 10/05/23  5:06 AM   Specimen: BLOOD  Result Value Ref Range Status   Specimen Description BLOOD LA  Final   Special Requests   Final    BOTTLES DRAWN AEROBIC AND ANAEROBIC Blood Culture results may not be optimal due to an inadequate volume of blood received in culture bottles   Culture   Final    NO GROWTH 5 DAYS Performed at [facility], [address] [place], [place] [address]    Report Status 10/10/2023 FINAL  Final  Blood Culture (routine x 2)     Status: None   Collection Time: 10/05/23  5:07 AM   Specimen: BLOOD  Result Value Ref Range Status   Specimen Description BLOOD RA  Final   Special Requests   Final    BOTTLES DRAWN AEROBIC AND ANAEROBIC Blood Culture results may not be optimal due to an inadequate volume of blood received in culture bottles   Culture   Final    NO GROWTH 5 DAYS Performed at [facility], [address] [place], [place] [address]    Report Status 10/10/2023 FINAL  Final  Resp panel by RT-PCR (RSV, Flu A&B, Covid) Anterior Nasal Swab      Status: None   Collection Time: 10/05/23  5:56 AM   Specimen: Anterior Nasal Swab  Result Value Ref Range Status   SARS Coronavirus 2 by RT PCR NEGATIVE NEGATIVE Final    Comment: (NOTE) SARS-CoV-2 target nucleic acids are NOT DETECTED.  The SARS-CoV-2 RNA is generally detectable in upper respiratory specimens during the acute phase of infection. The lowest concentration of SARS-CoV-2 viral copies this assay can detect is 138 copies/mL. A negative result does not preclude SARS-Cov-2 infection and should not be used as the sole basis for treatment or other patient management decisions. A negative result may occur with  improper specimen collection/handling, submission of specimen other than nasopharyngeal swab, presence of viral mutation(s) within the areas targeted by this assay, and inadequate number of viral copies(<138 copies/mL). A negative result must be combined with clinical observations, patient history, and epidemiological information. The expected result is Negative.  Fact Sheet for Patients:  BloggerCourse.com  Fact Sheet for Healthcare Providers:  SeriousBroker.it  This test is no t yet approved or cleared by the [place]  FDA and  has been authorized for detection and/or diagnosis of SARS-CoV-2 by FDA under an Emergency Use Authorization (EUA). This EUA will remain  in effect (meaning this test can be used) for the duration of the COVID-19 declaration under Section 564(b)(1) of the Act, 21 U.S.C.section 360bbb-3(b)(1), unless the authorization is terminated  or revoked sooner.       Influenza A by PCR NEGATIVE NEGATIVE Final   Influenza B by PCR NEGATIVE NEGATIVE Final    Comment: (NOTE) The Xpert Xpress SARS-CoV-2/FLU/RSV plus assay is intended as an aid in the diagnosis of influenza from Nasopharyngeal swab specimens and should not be used as a sole basis for treatment. Nasal washings and aspirates are  unacceptable for Xpert Xpress SARS-CoV-2/FLU/RSV testing.  Fact Sheet for Patients: BloggerCourse.com  Fact Sheet for Healthcare Providers: SeriousBroker.it  This test is not yet approved or cleared by the [place]  FDA and has been authorized for detection and/or diagnosis of SARS-CoV-2 by FDA under an Emergency Use Authorization (EUA). This EUA will remain in effect (meaning this test can  be used) for the duration of the COVID-19 declaration under Section 564(b)(1) of the Act, 21 U.S.C. section 360bbb-3(b)(1), unless the authorization is terminated or revoked.     Resp Syncytial Virus by PCR NEGATIVE NEGATIVE Final    Comment: (NOTE) Fact Sheet for Patients: BloggerCourse.com  Fact Sheet for Healthcare Providers: SeriousBroker.it  This test is not yet approved or cleared by the [place]  FDA and has been authorized for detection and/or diagnosis of SARS-CoV-2 by FDA under an Emergency Use Authorization (EUA). This EUA will remain in effect (meaning this test can be used) for the duration of the COVID-19 declaration under Section 564(b)(1) of the Act, 21 U.S.C. section 360bbb-3(b)(1), unless the authorization is terminated or revoked.  Performed at [facility], [address] [place], [place] [address]   Respiratory (~20 pathogens) panel by PCR     Status: None   Collection Time: 10/05/23  8:11 AM   Specimen: Nasopharyngeal Swab; Respiratory  Result Value Ref Range Status   Adenovirus NOT DETECTED NOT DETECTED Final   Coronavirus 229E NOT DETECTED NOT DETECTED Final    Comment: (NOTE) The Coronavirus on the Respiratory Panel, DOES NOT test for the novel  Coronavirus (2019 nCoV)    Coronavirus HKU1 NOT DETECTED NOT DETECTED Final   Coronavirus NL63 NOT DETECTED NOT DETECTED Final   Coronavirus OC43 NOT DETECTED NOT DETECTED Final   Metapneumovirus NOT  DETECTED NOT DETECTED Final   Rhinovirus / Enterovirus NOT DETECTED NOT DETECTED Final   Influenza A NOT DETECTED NOT DETECTED Final   Influenza B NOT DETECTED NOT DETECTED Final   Parainfluenza Virus 1 NOT DETECTED NOT DETECTED Final   Parainfluenza Virus 2 NOT DETECTED NOT DETECTED Final   Parainfluenza Virus 3 NOT DETECTED NOT DETECTED Final   Parainfluenza Virus 4 NOT DETECTED NOT DETECTED Final   Respiratory Syncytial Virus NOT DETECTED NOT DETECTED Final   Bordetella pertussis NOT DETECTED NOT DETECTED Final   Bordetella Parapertussis NOT DETECTED NOT DETECTED Final   Chlamydophila pneumoniae NOT DETECTED NOT DETECTED Final   Mycoplasma pneumoniae NOT DETECTED NOT DETECTED Final    Comment: Performed at [facility], [address], [place], [place] [address]  Expectorated Sputum Assessment w Gram Stain, Rflx to Resp Cult     Status: None   Collection Time: 10/05/23  9:07 AM   Specimen: Sputum  Result Value Ref Range Status   Specimen Description SPUTUM  Final   Special Requests NONE  Final   Sputum evaluation   Final    Sputum specimen not acceptable for testing.  Please recollect.   C/KERRY NELSON AT 1005 10/05/23.PMF Performed at [facility], [address] [place], [place] [address]    Report Status 10/05/2023 FINAL  Final  Expectorated Sputum Assessment w Gram Stain, Rflx to Resp Cult     Status: None   Collection Time: 10/05/23 10:50 AM  Result Value Ref Range Status   Specimen Description EXPECTORATED SPUTUM  Final   Special Requests NONE  Final   Sputum evaluation   Final    THIS SPECIMEN IS ACCEPTABLE FOR SPUTUM CULTURE Performed at [facility], [address] [place], [place] [address]    Report Status 10/05/2023 FINAL  Final  Culture, Respiratory w Gram Stain     Status: None   Collection Time: 10/05/23 10:50 AM  Result Value Ref Range Status   Specimen Description   Final    EXPECTORATED SPUTUM Performed at [facility],  [address] [place], [place] [address]    Special Requests   Final    NONE Reflexed from [id number] Performed at [facility] [address] [place], [place] [address]    Gram Stain   Final    RARE WBC SEEN RARE VONNE POSITIVE RODS RARE VONNE POSITIVE COCCI RARE GRAM NEGATIVE RODS    Culture   Final    FEW Normal respiratory flora-no Staph aureus or Pseudomonas seen Performed at [facility], [address] [place], [place] [address]    Report Status 10/07/2023 FINAL  Final  MRSA Next Gen by PCR, Nasal     Status: None   Collection Time: 10/06/23 12:57 AM   Specimen: Nasal Mucosa; Nasal Swab  Result Value Ref Range Status   MRSA by PCR Next Gen NOT DETECTED NOT DETECTED Final    Comment: (NOTE) The GeneXpert MRSA Assay (FDA approved for NASAL specimens only), is one component of a comprehensive MRSA colonization surveillance program. It is not intended to diagnose MRSA infection nor to guide or monitor treatment for MRSA infections. Test performance is not FDA approved in patients less than 52 years old. Performed at [facility], [address] [place], [place] [address]   Culture, Respiratory w Gram Stain     Status: None   Collection Time: 10/06/23 11:37 AM   Specimen: INDUCED SPUTUM  Result Value Ref Range Status   Specimen Description   Final    INDUCED SPUTUM Performed at [facility], [address] [place], [place] [address]    Special Requests   Final    NONE Performed at [facility], [address] [place], [place] [address]    Gram Stain   Final    FEW WBC PRESENT,BOTH PMN AND MONONUCLEAR FEW GRAM POSITIVE RODS    Culture   Final    MODERATE LACTOBACILLUS FERMENTUM Standardized susceptibility testing for this organism is not available. Performed at [facility], [address], [place], [place] [address]    Report Status 10/09/2023 FINAL  Final  Culture, blood (x 2)     Status: None   Collection  Time: 10/22/23  1:00 AM   Specimen: BLOOD  Result Value Ref Range Status   Specimen Description BLOOD BLOOD RIGHT ARM  Final   Special Requests   Final    BOTTLES DRAWN AEROBIC AND ANAEROBIC Blood Culture adequate volume   Culture   Final    NO GROWTH 5 DAYS Performed at [facility], [address], [place], [place] [address]    Report Status 10/27/2023 FINAL  Final  Culture, blood (x 2)     Status: None   Collection Time: 10/22/23  1:00 AM   Specimen: BLOOD  Result Value Ref Range Status   Specimen Description BLOOD BLOOD RIGHT ARM  Final   Special Requests   Final    BOTTLES DRAWN AEROBIC AND ANAEROBIC Blood Culture adequate volume   Culture   Final    NO GROWTH 5 DAYS Performed at [facility], [address], [place], [place] [address]    Report Status 10/27/2023 FINAL  Final  Resp panel by RT-PCR (RSV, Flu A&B, Covid) Anterior Nasal Swab     Status: None   Collection Time: 11/09/23  5:31 PM   Specimen: Anterior Nasal Swab  Result Value Ref Range Status   SARS Coronavirus 2 by RT PCR NEGATIVE NEGATIVE Final    Comment: (NOTE) SARS-CoV-2 target nucleic acids are NOT  DETECTED.  The SARS-CoV-2 RNA is generally detectable in upper respiratory specimens during the acute phase of infection. The lowest concentration of SARS-CoV-2 viral copies this assay can detect is 138 copies/mL. A negative result does not preclude SARS-Cov-2 infection and should not be used as the sole basis for treatment or other patient management decisions. A negative result may occur with  improper specimen collection/handling, submission of specimen other than nasopharyngeal swab, presence of viral mutation(s) within the areas targeted by this assay, and inadequate number of viral copies(<138 copies/mL). A negative result must be combined with clinical observations, patient history, and epidemiological information. The expected result is Negative.  Fact Sheet for Patients:   BloggerCourse.com  Fact Sheet for Healthcare Providers:  SeriousBroker.it  This test is no t yet approved or cleared by the [place]  FDA and  has been authorized for detection and/or diagnosis of SARS-CoV-2 by FDA under an Emergency Use Authorization (EUA). This EUA will remain  in effect (meaning this test can be used) for the duration of the COVID-19 declaration under Section 564(b)(1) of the Act, 21 U.S.C.section 360bbb-3(b)(1), unless the authorization is terminated  or revoked sooner.       Influenza A by PCR NEGATIVE NEGATIVE Final   Influenza B by PCR NEGATIVE NEGATIVE Final    Comment: (NOTE) The Xpert Xpress SARS-CoV-2/FLU/RSV plus assay is intended as an aid in the diagnosis of influenza from Nasopharyngeal swab specimens and should not be used as a sole basis for treatment. Nasal washings and aspirates are unacceptable for Xpert Xpress SARS-CoV-2/FLU/RSV testing.  Fact Sheet for Patients: BloggerCourse.com  Fact Sheet for Healthcare Providers: SeriousBroker.it  This test is not yet approved or cleared by the [place]  FDA and has been authorized for detection and/or diagnosis of SARS-CoV-2 by FDA under an Emergency Use Authorization (EUA). This EUA will remain in effect (meaning this test can be used) for the duration of the COVID-19 declaration under Section 564(b)(1) of the Act, 21 U.S.C. section 360bbb-3(b)(1), unless the authorization is terminated or revoked.     Resp Syncytial Virus by PCR NEGATIVE NEGATIVE Final    Comment: (NOTE) Fact Sheet for Patients: BloggerCourse.com  Fact Sheet for Healthcare Providers: SeriousBroker.it  This test is not yet approved or cleared by the [place]  FDA and has been authorized for detection and/or diagnosis of SARS-CoV-2 by FDA under an Emergency Use  Authorization (EUA). This EUA will remain in effect (meaning this test can be used) for the duration of the COVID-19 declaration under Section 564(b)(1) of the Act, 21 U.S.C. section 360bbb-3(b)(1), unless the authorization is terminated or revoked.  Performed at [facility], [address] [place], [place] [address]   Respiratory (~20 pathogens) panel by PCR     Status: None   Collection Time: 11/09/23  5:31 PM   Specimen: Nasopharyngeal Swab; Respiratory  Result Value Ref Range Status   Adenovirus NOT DETECTED NOT DETECTED Final   Coronavirus 229E NOT DETECTED NOT DETECTED Final    Comment: (NOTE) The Coronavirus on the Respiratory Panel, DOES NOT test for the novel  Coronavirus (2019 nCoV)    Coronavirus HKU1 NOT DETECTED NOT DETECTED Final   Coronavirus NL63 NOT DETECTED NOT DETECTED Final   Coronavirus OC43 NOT DETECTED NOT DETECTED Final   Metapneumovirus NOT DETECTED NOT DETECTED Final   Rhinovirus / Enterovirus NOT DETECTED NOT DETECTED Final   Influenza A NOT DETECTED NOT DETECTED Final   Influenza B NOT DETECTED NOT DETECTED Final   Parainfluenza Virus 1  NOT DETECTED NOT DETECTED Final   Parainfluenza Virus 2 NOT DETECTED NOT DETECTED Final   Parainfluenza Virus 3 NOT DETECTED NOT DETECTED Final   Parainfluenza Virus 4 NOT DETECTED NOT DETECTED Final   Respiratory Syncytial Virus NOT DETECTED NOT DETECTED Final   Bordetella pertussis NOT DETECTED NOT DETECTED Final   Bordetella Parapertussis NOT DETECTED NOT DETECTED Final   Chlamydophila pneumoniae NOT DETECTED NOT DETECTED Final   Mycoplasma pneumoniae NOT DETECTED NOT DETECTED Final    Comment: Performed at [facility], [address], [place], [place] [address]  Culture, blood (Routine X 2) w Reflex to ID Panel     Status: None   Collection Time: 11/09/23  6:16 PM   Specimen: BLOOD  Result Value Ref Range Status   Specimen Description   Final    BLOOD RIGHT ANTECUBITAL Performed at [facility], [address], [place], [place] [address]    Special Requests   Final    BOTTLES DRAWN AEROBIC ONLY Blood Culture adequate volume Performed at [facility], [address], [place], [place] [address]    Culture   Final    NO GROWTH 11 DAYS Performed at [facility], [address], [place], [place] [address]    Report Status 11/20/2023 FINAL  Final  Culture, blood (Routine X 2) w Reflex to ID Panel     Status: None   Collection Time: 11/09/23  6:23 PM   Specimen: BLOOD  Result Value Ref Range Status   Specimen Description   Final    BLOOD BLOOD LEFT FOREARM Performed at [facility], [address] [place], [place] [address]    Special Requests   Final    BOTTLES DRAWN AEROBIC AND ANAEROBIC Blood Culture adequate volume Performed at [facility], [address] [place], [place] [address]    Culture   Final    NO GROWTH 11 DAYS Performed at [facility], [address] [place], [place] [address]    Report Status 11/20/2023 FINAL  Final  Urine Culture     Status: Abnormal   Collection Time: 11/10/23  1:58 AM   Specimen: Urine, Random  Result Value Ref Range Status   Specimen Description   Final    URINE, RANDOM Performed at [facility], [address] [place], [place] [address]    Special Requests   Final    NONE Reflexed from [id number] Performed at [facility], [address] [place], [place] [address]    Culture 60,000 COLONIES/mL ENTEROCOCCUS FAECALIS (A)  Final   Report Status 11/12/2023 FINAL  Final   Organism ID, Bacteria ENTEROCOCCUS FAECALIS (A)  Final      Susceptibility   Enterococcus faecalis - MIC*    AMPICILLIN  <=2 SENSITIVE Sensitive     NITROFURANTOIN <=16 SENSITIVE Sensitive     VANCOMYCIN  1 SENSITIVE Sensitive     * 60,000 COLONIES/mL ENTEROCOCCUS FAECALIS  Culture, blood (x 2)     Status: None   Collection Time: 11/25/23 10:31 PM   Specimen: BLOOD  Result Value Ref  Range Status   Specimen Description BLOOD BLOOD LEFT ARM  Final   Special Requests   Final    BOTTLES DRAWN AEROBIC AND ANAEROBIC Blood Culture adequate volume   Culture   Final    NO GROWTH 5 DAYS Performed at [facility], [address] [place], [place] [address]    Report Status 11/30/2023 FINAL  Final  Culture, blood (x 2)     Status: None   Collection Time: 11/25/23 10:31 PM   Specimen: BLOOD  Result Value Ref Range Status   Specimen Description BLOOD BLOOD RIGHT ARM  Final   Special Requests   Final    BOTTLES DRAWN AEROBIC AND ANAEROBIC Blood Culture adequate volume   Culture   Final    NO GROWTH 5 DAYS Performed at [facility], [address] [place], [place] [address]    Report Status 11/30/2023 FINAL  Final  Gastrointestinal Panel by PCR , Stool     Status: None   Collection Time: 11/30/23  5:10 PM   Specimen: Stool  Result Value Ref Range Status   Campylobacter species NOT DETECTED NOT DETECTED Final   Plesimonas shigelloides NOT DETECTED NOT DETECTED Final   Salmonella species NOT DETECTED NOT DETECTED Final   Yersinia enterocolitica NOT DETECTED NOT DETECTED Final   Vibrio species NOT DETECTED NOT DETECTED Final   Vibrio cholerae NOT DETECTED NOT DETECTED Final   Enteroaggregative E coli (EAEC) NOT DETECTED NOT DETECTED Final   Enteropathogenic E coli (EPEC) NOT DETECTED NOT DETECTED Final   Enterotoxigenic E coli (ETEC) NOT DETECTED NOT DETECTED Final   Shiga like toxin producing E coli (STEC) NOT DETECTED NOT DETECTED Final   Shigella/Enteroinvasive E coli (EIEC) NOT DETECTED NOT DETECTED Final   Cryptosporidium NOT DETECTED NOT DETECTED Final   Cyclospora cayetanensis NOT DETECTED NOT DETECTED Final   Entamoeba histolytica NOT DETECTED NOT DETECTED Final   Giardia lamblia NOT DETECTED NOT DETECTED Final   Adenovirus F40/41 NOT DETECTED NOT DETECTED Final   Astrovirus NOT DETECTED NOT DETECTED Final   Norovirus GI/GII NOT DETECTED NOT  DETECTED Final   Rotavirus A NOT DETECTED NOT DETECTED Final   Sapovirus (I, II, IV, and V) NOT DETECTED NOT DETECTED Final    Comment: Performed at [facility], [address] [place], [place] [address]    Coagulation Studies: No results for input(s): LABPROT, INR in the last 72 hours.   Urinalysis: No results for input(s): COLORURINE, LABSPEC, PHURINE, GLUCOSEU, HGBUR, BILIRUBINUR, KETONESUR, PROTEINUR, UROBILINOGEN, NITRITE, LEUKOCYTESUR in the last 72 hours.  Invalid input(s): APPERANCEUR     Imaging: No results found.       Medications:    albumin  human 60 mL/hr at 12/02/23 0700   piperacillin -tazobactam (ZOSYN )  IV 2.25 g (12/02/23 1248)    sodium chloride    Intravenous Once   Chlorhexidine  Gluconate Cloth  6 each Topical Q0600   epoetin  alfa-epbx (RETACRIT ) injection  10,000 Units Intravenous Q M,W,F-HD   feeding supplement (NEPRO CARB STEADY)  237 mL Per Tube 5 X Daily   ferrous sulfate   325 mg Per Tube Daily   fiber supplement (BANATROL TF)  60 mL Per Tube BID   free water   60 mL Per Tube 5 X Daily   gentamicin  ointment   Topical TID   heparin  injection (subcutaneous)  5,000 Units Subcutaneous Q8H   insulin  aspart  10 Units Subcutaneous Once   lactobacillus  1 g Per Tube TID WC   midodrine   5 mg Per Tube TID WC   multivitamin  1 tablet Per Tube QHS   pantoprazole  (PROTONIX ) IV  40 mg Intravenous Q12H   polyethylene glycol  17 g Per Tube Daily   acetaminophen  **OR** acetaminophen , albumin  human, artificial tears, bisacodyl , ipratropium-albuterol , [DISCONTINUED] ondansetron  **OR** ondansetron  (ZOFRAN ) IV, mouth rinse  Assessment/ Plan:  Mr. ABDIRAHIM FLAVELL is a 83 y.o.  male  with obstructive sleep apnea, GERD, obesity, hypertension, dysphagia, severe esophageal dysmotility with achalasia status post PEG tube placement, recent aspiration pneumonia acute respiratory failure status post extubation 10/09/2023, who was  admitted to [facility] on 10/05/2023 for Aspiration into respiratory tract, initial encounter [T17.908A] Severe sepsis (HCC) [A41.9, R65.20] History of dysphagia [Z87.898] Fever, unspecified fever cause [R50.9] Multifocal pneumonia [J18.9]   1.  Acute kidney injury. Requiring hemodialysis. With obstructive uropathy Baseline creatinine 0.81 from 10/14/2023.  Acute kidney injury secondary to ATN, obstructive uropathy, and progression of prerenal azotemia.   Patient is critically ill.  He was started on urgent hemodialysis for severe hyperkalemia and uremia. Vascular surgery placed PermCath on 6/26.  - TOC dialysis coordinator notified of need for outpatient dialysis clinic.  Search remains in progress. - Will receive dialysis later today.  - requiring IV albumin  with dialysis treatments for blood pressure support. - Overall prognosis is poor   2.  Sepsis and hypotension due to aspiration pneumonia, found to have distal esophageal obstruction with severe dysmotility.  He is status post botulinum toxin injection to the lower esophageal sphincter.  PEG tube placed this admission. Receiving continuous tube feeds with scheduled free water  flushes.  - Continue midodrine   4.Anemia in the setting of renal failure Lab Results  Component Value Date   HGB 7.6 (L) 12/02/2023  - IV EPO with HD treatments.   5.  Bilateral hydronephrosis Noted on CT.Urology team has evaluated the patient and it is thought to be secondary to reflux.  Due to health, further intervention deferred at this time.     LOS: 58 Sparkles Mcneely 7/7/20252:34 PM

## 2023-12-03 ENCOUNTER — Inpatient Hospital Stay

## 2023-12-03 DIAGNOSIS — J690 Pneumonitis due to inhalation of food and vomit: Secondary | ICD-10-CM | POA: Diagnosis not present

## 2023-12-03 DIAGNOSIS — R4182 Altered mental status, unspecified: Secondary | ICD-10-CM | POA: Diagnosis not present

## 2023-12-03 LAB — BLOOD GAS, VENOUS
Acid-base deficit: 0.6 mmol/L (ref 0.0–2.0)
Bicarbonate: 27.6 mmol/L (ref 20.0–28.0)
O2 Saturation: 96.5 %
Patient temperature: 37
pCO2, Ven: 60 mmHg (ref 44–60)
pH, Ven: 7.27 (ref 7.25–7.43)
pO2, Ven: 71 mmHg — ABNORMAL HIGH (ref 32–45)

## 2023-12-03 LAB — CBC WITH DIFFERENTIAL/PLATELET
Abs Immature Granulocytes: 0.94 K/uL — ABNORMAL HIGH (ref 0.00–0.07)
Basophils Absolute: 0.1 K/uL (ref 0.0–0.1)
Basophils Relative: 0 %
Eosinophils Absolute: 0.1 K/uL (ref 0.0–0.5)
Eosinophils Relative: 0 %
HCT: 24.6 % — ABNORMAL LOW (ref 39.0–52.0)
Hemoglobin: 7.6 g/dL — ABNORMAL LOW (ref 13.0–17.0)
Immature Granulocytes: 5 %
Lymphocytes Relative: 6 %
Lymphs Abs: 1.1 K/uL (ref 0.7–4.0)
MCH: 28.8 pg (ref 26.0–34.0)
MCHC: 30.9 g/dL (ref 30.0–36.0)
MCV: 93.2 fL (ref 80.0–100.0)
Monocytes Absolute: 2.8 K/uL — ABNORMAL HIGH (ref 0.1–1.0)
Monocytes Relative: 15 %
Neutro Abs: 13.8 K/uL — ABNORMAL HIGH (ref 1.7–7.7)
Neutrophils Relative %: 74 %
Platelets: 468 K/uL — ABNORMAL HIGH (ref 150–400)
RBC: 2.64 MIL/uL — ABNORMAL LOW (ref 4.22–5.81)
RDW: 15.9 % — ABNORMAL HIGH (ref 11.5–15.5)
WBC: 18.8 K/uL — ABNORMAL HIGH (ref 4.0–10.5)
nRBC: 0.2 % (ref 0.0–0.2)

## 2023-12-03 LAB — RENAL FUNCTION PANEL
Albumin: 1.8 g/dL — ABNORMAL LOW (ref 3.5–5.0)
Anion gap: 13 (ref 5–15)
BUN: 65 mg/dL — ABNORMAL HIGH (ref 8–23)
CO2: 26 mmol/L (ref 22–32)
Calcium: 7.8 mg/dL — ABNORMAL LOW (ref 8.9–10.3)
Chloride: 97 mmol/L — ABNORMAL LOW (ref 98–111)
Creatinine, Ser: 5.21 mg/dL — ABNORMAL HIGH (ref 0.61–1.24)
GFR, Estimated: 10 mL/min — ABNORMAL LOW (ref >60)
Glucose, Bld: 117 mg/dL — ABNORMAL HIGH (ref 70–99)
Phosphorus: 5.7 mg/dL — ABNORMAL HIGH (ref 2.5–4.6)
Potassium: 3.7 mmol/L (ref 3.5–5.1)
Sodium: 136 mmol/L (ref 135–145)

## 2023-12-03 LAB — GLUCOSE, CAPILLARY
Glucose-Capillary: 131 mg/dL — ABNORMAL HIGH (ref 70–99)
Glucose-Capillary: 113 mg/dL — ABNORMAL HIGH (ref 70–99)
Glucose-Capillary: 134 mg/dL — ABNORMAL HIGH (ref 70–99)
Glucose-Capillary: 113 mg/dL — ABNORMAL HIGH (ref 70–99)

## 2023-12-03 MED ORDER — NEPRO/CARBSTEADY PO LIQD
1000.0000 mL | ORAL | Status: DC
  Administered 2023-12-03: 1000 mL

## 2023-12-03 MED ORDER — FREE WATER
60.0000 mL | Status: DC
  Administered 2023-12-03 – 2023-12-04 (×13): 60 mL

## 2023-12-03 MED ORDER — PIPERACILLIN-TAZOBACTAM IN DEX 2-0.25 GM/50ML IV SOLN
2.2500 g | Freq: Three times a day (TID) | INTRAVENOUS | Status: AC
  Administered 2023-12-03 – 2023-12-05 (×6): 2.25 g via INTRAVENOUS
  Filled 2023-12-03 (×7): qty 50

## 2023-12-03 MED ORDER — GADOBUTROL 1 MMOL/ML IV SOLN
10.0000 mL | Freq: Once | INTRAVENOUS | Status: AC | PRN
  Administered 2023-12-03: 10 mL via INTRAVENOUS

## 2023-12-03 NOTE — Progress Notes (Addendum)
 SLP Cancellation Note  Patient Details Name: Robert Vega MRN: [id number] DOB: 1941-02-09   Cancelled treatment:       Reason Eval/Treat Not Completed: Patient's level of consciousness;Patient not medically ready (chart reviewed; consulted MD and NSG/TOC re: pt's status/presentation today and in recent days.)  Per request from MD, ST services attempted to make an assessment of pt's current presentation in setting of his Medical issues (ongoing). Per NSG, pt has been attempting bolus feeds vs continuous; he is confused. Similar was noted at admit (~60 days ago) per the ED note: Patient noted to be overall poor historian.SABRA  Upon arrival to room, pt did not awaken to general verbal stim. W/ further hands-on attention (moving up in bed included), pt remained unresponsive to MAX verbal/tactile stim except to make a grunt/phonation noise x3-4(on exhalations). NSG reported responded to pain. NSG contacted MD to update.   Pt is Not appropriate for a cognitive assessment at this time. Suspect pt may not be appropriate for such assessment while in an Acute State of illness and in setting of such a LENGTHY stay/illness (59d) in a hospital, especially at his age. His BASELINE Cognitive functioning is unknown being that he lived Alone(?).   ST services has recommended pt to have f/u for assessment/tx at his next venue of care for any cognitive-linguistic issues/needs, once he is in a structured/routine setting and his medical issues have much improved. This would Best indicate his deficits/needs when Not in an Acute State of illness. IF there is concern for Dementia(cognitive decline), then Neurology would have to be consulted to make that determination. Palliative Care consult recommended. MD and TOC udpated; NSG present.       Comer Portugal, MS, CCC-SLP Speech Language Pathologist Rehab Services; [facility] [phone] (ascom) Dail Lerew 12/03/2023, 11:37 AM

## 2023-12-03 NOTE — Progress Notes (Signed)
 Physical Therapy Treatment Patient Details Name: [name] MRN: [id number] DOB: 12/13/1940 Today's Date: 12/03/2023   History of Present Illness 83 y.o male with significant PMH of OSA, GERD, Obesity, HTN, Dysphagia: EGD 03/2022 with food in upper esophagus complicated by aspiration event, cardiac arrest with round of CPR, and post resuscitation EGD with concern for lack of peristalsis. Pt presented to ED on 10/05/2023 with hypoxia, fever and generalized weakness; developed acute respiratory failure requiring intubation 5/10 due to aspiration pneumonia, extubated 5/15, but had significant agitation required brief course of Precedex ; pt now s/p exploratory laparotomy with closure of gastrotomy and insertion of gastrostomy tube on 10/22/23.  PT order discontinued by MD 11/10/23 and new PT consult received 11/14/23.  Pt s/p R temporary femoral vein dialysis catheter placement 11/11/23; now removed; pt now with HD perm cath.    PT Comments  Today's tx session performed with OT to address pt's needs and for safety. Pt this date unable to arouse without painful stimuli this date, unable to verbalize words, inconsistently grunting. Pt found with a soiled bed, PT/OT assisted with pericare. Pt requiring total a x2 for bed mobility, unable to participate in therapy despite multimodal cueing. Pt transferred into sitting EOB with total a x3, two supporting bilat LE with someone in the back. Pt assisted back into chair position in bed, checking room for delirium precautions. Unable to arouse pt any further, left with oven mitts in bed. PT sent secure chat to pt's team about assisting pt daily into recliner when appropriate to improve pt's upright tolerance. PT will follow acutely as appropriate.      If plan is discharge home, recommend the following: Two people to help with walking and/or transfers;Two people to help with bathing/dressing/bathroom;Assistance with cooking/housework;Direct supervision/assist for  medications management;Direct supervision/assist for financial management;Assist for transportation;Help with stairs or ramp for entrance;Supervision due to cognitive status   Can travel by private vehicle     No  Equipment Recommendations  Other (comment)    Recommendations for Other Services       Precautions / Restrictions Precautions Precautions: Fall Recall of Precautions/Restrictions: Impaired Precaution/Restrictions Comments: PEG; perm cath (HD) Restrictions Weight Bearing Restrictions Per Provider Order: No Other Position/Activity Restrictions: Safety mitts donned     Mobility  Bed Mobility Overal bed mobility: Needs Assistance Bed Mobility: Rolling Rolling: Total assist, +2 for physical assistance Sidelying to sit: Total assist, +2 for physical assistance   Sit to supine: Total assist, +2 for physical assistance, +2 for safety/equipment   General bed mobility comments: pt resistive to movement, unable to participate this date d/t lethargy level, pt briefly opens an eye but unable to keep open for extended periods of time, does not respond to name    Transfers                   General transfer comment: Not appropriate at this time    Ambulation/Gait               General Gait Details: not appropriate at this time   Stairs             Wheelchair Mobility     Tilt Bed    Modified Rankin (Stroke Patients Only)       Balance Overall balance assessment: Needs assistance Sitting-balance support: Feet supported, Bilateral upper extremity supported Sitting balance-Leahy Scale: Zero Sitting balance - Comments: pt does not hold himself up this date, needs total a x3 (2 in front, 1 behind)  d/t posterior lean Postural control: Posterior lean                                  Communication Communication Communication: Impaired Factors Affecting Communication: Other (comment) (lethargy level affecting communication, pt only  able to grunt inconsistently)  Cognition Arousal: Stuporous       Difficult to assess due to: Impaired communication, Level of arousal                     PT - Cognition Comments: Pt only able to semi-react to painful stimuli, was unable to speak any words this date Following commands: Impaired      Cueing Cueing Techniques: Verbal cues, Gestural cues, Tactile cues, Visual cues  Exercises      General Comments        Pertinent Vitals/Pain Pain Assessment Pain Assessment: No/denies pain    Home Living                          Prior Function            PT Goals (current goals can now be found in the care plan section) Acute Rehab PT Goals Patient Stated Goal: none stated PT Goal Formulation: Patient unable to participate in goal setting Time For Goal Achievement: 12/10/23 Potential to Achieve Goals: Fair Progress towards PT goals: Progressing toward goals    Frequency    Min 2X/week      PT Plan      Co-evaluation PT/OT/SLP Co-Evaluation/Treatment: Yes Reason for Co-Treatment: Necessary to address cognition/behavior during functional activity;For patient/therapist safety;To address functional/ADL transfers PT goals addressed during session: Mobility/safety with mobility        AM-PAC PT 6 Clicks Mobility   Outcome Measure  Help needed turning from your back to your side while in a flat bed without using bedrails?: Total Help needed moving from lying on your back to sitting on the side of a flat bed without using bedrails?: Total Help needed moving to and from a bed to a chair (including a wheelchair)?: Total Help needed standing up from a chair using your arms (e.g., wheelchair or bedside chair)?: Total Help needed to walk in hospital room?: Total Help needed climbing 3-5 steps with a railing? : Total 6 Click Score: 6    End of Session         PT Visit Diagnosis: Other abnormalities of gait and mobility (R26.89);Muscle  weakness (generalized) (M62.81)     Time: 8581-8556 PT Time Calculation (min) (ACUTE ONLY): 25 min  Charges:                               Abdurahman Rugg Romero-Perozo, SPT  12/03/2023, 3:12 PM

## 2023-12-03 NOTE — Progress Notes (Signed)
 [facility]  ROUNDING NOTE   Subjective:   Patient laying in bed No family present Denies pain or discomfort   Objective:  Vital signs in last 24 hours:  Temp:  [98 F (36.7 C)-98.8 F (37.1 C)] 98.5 F (36.9 C) (07/08 1133) Pulse Rate:  [72-90] 77 (07/08 1133) Resp:  [16-30] 18 (07/08 1133) BP: (107-141)/(50-68) 108/57 (07/08 1133) SpO2:  [95 %-100 %] 97 % (07/08 1133) Weight:  [94.3 kg] 94.3 kg (07/07 1726)  Weight change:  Filed Weights   11/29/23 0830 11/29/23 1230 12/02/23 1726  Weight: 96.8 kg 95.3 kg 94.3 kg    Intake/Output: I/O last 3 completed shifts: In: 179.5 [Other:60; IV Piggyback:119.5] Out: 2000 [Other:2000]   Intake/Output this shift:  No intake/output data recorded.  Physical Exam: General: Ill appearing, laying in bed  Head: Oral mucosa moist  Eyes: Anicteric  Lungs:  Diminished  Heart: Regular rate and rhythm  Abdomen:  Peg tube in place, +distended  Extremities: no peripheral edema.  Neurologic: Alert  Skin: No lesions  Access: Rt internal jugular permcath    Basic Metabolic Panel: Recent Labs  Lab 11/27/23 0452 11/28/23 0931 11/29/23 0840 11/30/23 0911 12/01/23 1006 12/02/23 1040 12/03/23 0959  NA 132*   < > 131* 135 137 138 136  K 5.2*   < > 4.2 4.3 4.2 3.6 3.7  CL 95*   < > 92* 93* 98 98 97*  CO2 23   < > 24 26 24 22 26   GLUCOSE 97   < > 123* 132* 121* 140* 117*  BUN 99*   < > 94* 60* 83* 96* 65*  CREATININE 7.41*   < > 6.74* 4.50* 5.92* 7.56* 5.21*  CALCIUM  7.8*   < > 7.5* 7.6* 8.0* 7.9* 7.8*  MG 2.7*  --   --   --   --   --   --   PHOS 7.3*  --  5.0*  --   --   --  5.7*   < > = values in this interval not displayed.    Liver Function Tests: Recent Labs  Lab 11/29/23 0840 12/03/23 0959  ALBUMIN  1.7* 1.8*   No results for input(s): LIPASE, AMYLASE in the last 168 hours. No results for input(s): AMMONIA in the last 168 hours.  CBC: Recent Labs  Lab 11/28/23 0931 11/29/23 0825  11/30/23 0911 12/01/23 1006 12/02/23 1040 12/03/23 0959  WBC 25.4* 24.9* 20.3* 18.7* 18.4* 18.8*  NEUTROABS 21.4* 20.1*  --   --   --  13.8*  HGB 7.2* 7.4* 7.2* 8.1* 7.6* 7.6*  HCT 22.4* 23.7* 23.0* 26.6* 24.8* 24.6*  MCV 90.3 92.9 92.0 94.0 92.9 93.2  PLT 400 441* 401* 463* 513* 468*    Cardiac Enzymes: No results for input(s): CKTOTAL, CKMB, CKMBINDEX, TROPONINI in the last 168 hours.  BNP: Invalid input(s): POCBNP  CBG: Recent Labs  Lab 12/02/23 1229 12/02/23 1802 12/02/23 2020 12/03/23 0818 12/03/23 1112  GLUCAP 119* 122* 149* 131* 113*    Microbiology: Results for orders placed or performed during the hospital encounter of 10/05/23  Blood Culture (routine x 2)     Status: None   Collection Time: 10/05/23  5:06 AM   Specimen: BLOOD  Result Value Ref Range Status   Specimen Description BLOOD LA  Final   Special Requests   Final    BOTTLES DRAWN AEROBIC AND ANAEROBIC Blood Culture results may not be optimal due to an inadequate volume of blood received in culture  bottles   Culture   Final    NO GROWTH 5 DAYS Performed at [facility], [address] [place], [place] [address]    Report Status 10/10/2023 FINAL  Final  Blood Culture (routine x 2)     Status: None   Collection Time: 10/05/23  5:07 AM   Specimen: BLOOD  Result Value Ref Range Status   Specimen Description BLOOD RA  Final   Special Requests   Final    BOTTLES DRAWN AEROBIC AND ANAEROBIC Blood Culture results may not be optimal due to an inadequate volume of blood received in culture bottles   Culture   Final    NO GROWTH 5 DAYS Performed at [facility], [address] [place], [place] [address]    Report Status 10/10/2023 FINAL  Final  Resp panel by RT-PCR (RSV, Flu A&B, Covid) Anterior Nasal Swab     Status: None   Collection Time: 10/05/23  5:56 AM   Specimen: Anterior Nasal Swab  Result Value Ref Range Status   SARS Coronavirus 2 by RT PCR NEGATIVE NEGATIVE  Final    Comment: (NOTE) SARS-CoV-2 target nucleic acids are NOT DETECTED.  The SARS-CoV-2 RNA is generally detectable in upper respiratory specimens during the acute phase of infection. The lowest concentration of SARS-CoV-2 viral copies this assay can detect is 138 copies/mL. A negative result does not preclude SARS-Cov-2 infection and should not be used as the sole basis for treatment or other patient management decisions. A negative result may occur with  improper specimen collection/handling, submission of specimen other than nasopharyngeal swab, presence of viral mutation(s) within the areas targeted by this assay, and inadequate number of viral copies(<138 copies/mL). A negative result must be combined with clinical observations, patient history, and epidemiological information. The expected result is Negative.  Fact Sheet for Patients:  BloggerCourse.com  Fact Sheet for Healthcare Providers:  SeriousBroker.it  This test is no t yet approved or cleared by the [place]  FDA and  has been authorized for detection and/or diagnosis of SARS-CoV-2 by FDA under an Emergency Use Authorization (EUA). This EUA will remain  in effect (meaning this test can be used) for the duration of the COVID-19 declaration under Section 564(b)(1) of the Act, 21 U.S.C.section 360bbb-3(b)(1), unless the authorization is terminated  or revoked sooner.       Influenza A by PCR NEGATIVE NEGATIVE Final   Influenza B by PCR NEGATIVE NEGATIVE Final    Comment: (NOTE) The Xpert Xpress SARS-CoV-2/FLU/RSV plus assay is intended as an aid in the diagnosis of influenza from Nasopharyngeal swab specimens and should not be used as a sole basis for treatment. Nasal washings and aspirates are unacceptable for Xpert Xpress SARS-CoV-2/FLU/RSV testing.  Fact Sheet for Patients: BloggerCourse.com  Fact Sheet for Healthcare  Providers: SeriousBroker.it  This test is not yet approved or cleared by the [place]  FDA and has been authorized for detection and/or diagnosis of SARS-CoV-2 by FDA under an Emergency Use Authorization (EUA). This EUA will remain in effect (meaning this test can be used) for the duration of the COVID-19 declaration under Section 564(b)(1) of the Act, 21 U.S.C. section 360bbb-3(b)(1), unless the authorization is terminated or revoked.     Resp Syncytial Virus by PCR NEGATIVE NEGATIVE Final    Comment: (NOTE) Fact Sheet for Patients: BloggerCourse.com  Fact Sheet for Healthcare Providers: SeriousBroker.it  This test is not yet approved or cleared by the [place]  FDA and has been authorized for detection and/or diagnosis  of SARS-CoV-2 by FDA under an Emergency Use Authorization (EUA). This EUA will remain in effect (meaning this test can be used) for the duration of the COVID-19 declaration under Section 564(b)(1) of the Act, 21 U.S.C. section 360bbb-3(b)(1), unless the authorization is terminated or revoked.  Performed at [facility], [address] [place], [place] [address]   Respiratory (~20 pathogens) panel by PCR     Status: None   Collection Time: 10/05/23  8:11 AM   Specimen: Nasopharyngeal Swab; Respiratory  Result Value Ref Range Status   Adenovirus NOT DETECTED NOT DETECTED Final   Coronavirus 229E NOT DETECTED NOT DETECTED Final    Comment: (NOTE) The Coronavirus on the Respiratory Panel, DOES NOT test for the novel  Coronavirus (2019 nCoV)    Coronavirus HKU1 NOT DETECTED NOT DETECTED Final   Coronavirus NL63 NOT DETECTED NOT DETECTED Final   Coronavirus OC43 NOT DETECTED NOT DETECTED Final   Metapneumovirus NOT DETECTED NOT DETECTED Final   Rhinovirus / Enterovirus NOT DETECTED NOT DETECTED Final   Influenza A NOT DETECTED NOT DETECTED Final   Influenza B NOT  DETECTED NOT DETECTED Final   Parainfluenza Virus 1 NOT DETECTED NOT DETECTED Final   Parainfluenza Virus 2 NOT DETECTED NOT DETECTED Final   Parainfluenza Virus 3 NOT DETECTED NOT DETECTED Final   Parainfluenza Virus 4 NOT DETECTED NOT DETECTED Final   Respiratory Syncytial Virus NOT DETECTED NOT DETECTED Final   Bordetella pertussis NOT DETECTED NOT DETECTED Final   Bordetella Parapertussis NOT DETECTED NOT DETECTED Final   Chlamydophila pneumoniae NOT DETECTED NOT DETECTED Final   Mycoplasma pneumoniae NOT DETECTED NOT DETECTED Final    Comment: Performed at [facility], [address], [place], [place] [address]  Expectorated Sputum Assessment w Gram Stain, Rflx to Resp Cult     Status: None   Collection Time: 10/05/23  9:07 AM   Specimen: Sputum  Result Value Ref Range Status   Specimen Description SPUTUM  Final   Special Requests NONE  Final   Sputum evaluation   Final    Sputum specimen not acceptable for testing.  Please recollect.   C/KERRY NELSON AT 1005 10/05/23.PMF Performed at [facility], [address] [place], [place] [address]    Report Status 10/05/2023 FINAL  Final  Expectorated Sputum Assessment w Gram Stain, Rflx to Resp Cult     Status: None   Collection Time: 10/05/23 10:50 AM  Result Value Ref Range Status   Specimen Description EXPECTORATED SPUTUM  Final   Special Requests NONE  Final   Sputum evaluation   Final    THIS SPECIMEN IS ACCEPTABLE FOR SPUTUM CULTURE Performed at [facility], [address] [place] [address], [place], [place] [address]    Report Status 10/05/2023 FINAL  Final  Culture, Respiratory w Gram Stain     Status: None   Collection Time: 10/05/23 10:50 AM  Result Value Ref Range Status   Specimen Description   Final    EXPECTORATED SPUTUM Performed at [facility], [address], [place], [place] [address]    Special Requests   Final    NONE Reflexed from [id number] Performed at [facility], [address] [place], [place] [address]    Gram Stain   Final    RARE WBC SEEN RARE VONNE POSITIVE RODS RARE GRAM POSITIVE COCCI RARE GRAM NEGATIVE RODS    Culture   Final    FEW Normal respiratory flora-no Staph aureus or Pseudomonas seen  Performed at [facility], [address], [place], [place] [address]    Report Status 10/07/2023 FINAL  Final  MRSA Next Gen by PCR, Nasal     Status: None   Collection Time: 10/06/23 12:57 AM   Specimen: Nasal Mucosa; Nasal Swab  Result Value Ref Range Status   MRSA by PCR Next Gen NOT DETECTED NOT DETECTED Final    Comment: (NOTE) The GeneXpert MRSA Assay (FDA approved for NASAL specimens only), is one component of a comprehensive MRSA colonization surveillance program. It is not intended to diagnose MRSA infection nor to guide or monitor treatment for MRSA infections. Test performance is not FDA approved in patients less than 72 years old. Performed at [facility], [address] [place], [place] [address]   Culture, Respiratory w Gram Stain     Status: None   Collection Time: 10/06/23 11:37 AM   Specimen: INDUCED SPUTUM  Result Value Ref Range Status   Specimen Description   Final    INDUCED SPUTUM Performed at [facility], [address], [place], [place] [address]    Special Requests   Final    NONE Performed at [facility], [address] [place], [place] [address]    Gram Stain   Final    FEW WBC PRESENT,BOTH PMN AND MONONUCLEAR FEW GRAM POSITIVE RODS    Culture   Final    MODERATE LACTOBACILLUS FERMENTUM Standardized susceptibility testing for this organism is not available. Performed at [facility], [address], [place], [place] [address]    Report Status 10/09/2023 FINAL  Final  Culture, blood (x 2)     Status: None   Collection Time: 10/22/23  1:00 AM   Specimen: BLOOD  Result Value Ref Range Status   Specimen Description BLOOD BLOOD RIGHT ARM  Final   Special Requests    Final    BOTTLES DRAWN AEROBIC AND ANAEROBIC Blood Culture adequate volume   Culture   Final    NO GROWTH 5 DAYS Performed at [facility], [address] [place], [place] [address]    Report Status 10/27/2023 FINAL  Final  Culture, blood (x 2)     Status: None   Collection Time: 10/22/23  1:00 AM   Specimen: BLOOD  Result Value Ref Range Status   Specimen Description BLOOD BLOOD RIGHT ARM  Final   Special Requests   Final    BOTTLES DRAWN AEROBIC AND ANAEROBIC Blood Culture adequate volume   Culture   Final    NO GROWTH 5 DAYS Performed at [facility], [address] [place], [place] [address]    Report Status 10/27/2023 FINAL  Final  Resp panel by RT-PCR (RSV, Flu A&B, Covid) Anterior Nasal Swab     Status: None   Collection Time: 11/09/23  5:31 PM   Specimen: Anterior Nasal Swab  Result Value Ref Range Status   SARS Coronavirus 2 by RT PCR NEGATIVE NEGATIVE Final    Comment: (NOTE) SARS-CoV-2 target nucleic acids are NOT DETECTED.  The SARS-CoV-2 RNA is generally detectable in upper respiratory specimens during the acute phase of infection. The lowest concentration of SARS-CoV-2 viral copies this assay can detect is 138 copies/mL. A negative result does not preclude SARS-Cov-2 infection and should not be used as the sole basis for treatment or other patient management decisions. A negative result may occur with  improper specimen collection/handling, submission of specimen other than nasopharyngeal swab, presence of viral mutation(s)  within the areas targeted by this assay, and inadequate number of viral copies(<138 copies/mL). A negative result must be combined with clinical observations, patient history, and epidemiological information. The expected result is Negative.  Fact Sheet for Patients:  BloggerCourse.com  Fact Sheet for Healthcare Providers:  SeriousBroker.it  This test is no t yet  approved or cleared by the [place]  FDA and  has been authorized for detection and/or diagnosis of SARS-CoV-2 by FDA under an Emergency Use Authorization (EUA). This EUA will remain  in effect (meaning this test can be used) for the duration of the COVID-19 declaration under Section 564(b)(1) of the Act, 21 U.S.C.section 360bbb-3(b)(1), unless the authorization is terminated  or revoked sooner.       Influenza A by PCR NEGATIVE NEGATIVE Final   Influenza B by PCR NEGATIVE NEGATIVE Final    Comment: (NOTE) The Xpert Xpress SARS-CoV-2/FLU/RSV plus assay is intended as an aid in the diagnosis of influenza from Nasopharyngeal swab specimens and should not be used as a sole basis for treatment. Nasal washings and aspirates are unacceptable for Xpert Xpress SARS-CoV-2/FLU/RSV testing.  Fact Sheet for Patients: BloggerCourse.com  Fact Sheet for Healthcare Providers: SeriousBroker.it  This test is not yet approved or cleared by the [place]  FDA and has been authorized for detection and/or diagnosis of SARS-CoV-2 by FDA under an Emergency Use Authorization (EUA). This EUA will remain in effect (meaning this test can be used) for the duration of the COVID-19 declaration under Section 564(b)(1) of the Act, 21 U.S.C. section 360bbb-3(b)(1), unless the authorization is terminated or revoked.     Resp Syncytial Virus by PCR NEGATIVE NEGATIVE Final    Comment: (NOTE) Fact Sheet for Patients: BloggerCourse.com  Fact Sheet for Healthcare Providers: SeriousBroker.it  This test is not yet approved or cleared by the [place]  FDA and has been authorized for detection and/or diagnosis of SARS-CoV-2 by FDA under an Emergency Use Authorization (EUA). This EUA will remain in effect (meaning this test can be used) for the duration of the COVID-19 declaration under Section 564(b)(1) of  the Act, 21 U.S.C. section 360bbb-3(b)(1), unless the authorization is terminated or revoked.  Performed at [facility], [address] [place], [place] [address]   Respiratory (~20 pathogens) panel by PCR     Status: None   Collection Time: 11/09/23  5:31 PM   Specimen: Nasopharyngeal Swab; Respiratory  Result Value Ref Range Status   Adenovirus NOT DETECTED NOT DETECTED Final   Coronavirus 229E NOT DETECTED NOT DETECTED Final    Comment: (NOTE) The Coronavirus on the Respiratory Panel, DOES NOT test for the novel  Coronavirus (2019 nCoV)    Coronavirus HKU1 NOT DETECTED NOT DETECTED Final   Coronavirus NL63 NOT DETECTED NOT DETECTED Final   Coronavirus OC43 NOT DETECTED NOT DETECTED Final   Metapneumovirus NOT DETECTED NOT DETECTED Final   Rhinovirus / Enterovirus NOT DETECTED NOT DETECTED Final   Influenza A NOT DETECTED NOT DETECTED Final   Influenza B NOT DETECTED NOT DETECTED Final   Parainfluenza Virus 1 NOT DETECTED NOT DETECTED Final   Parainfluenza Virus 2 NOT DETECTED NOT DETECTED Final   Parainfluenza Virus 3 NOT DETECTED NOT DETECTED Final   Parainfluenza Virus 4 NOT DETECTED NOT DETECTED Final   Respiratory Syncytial Virus NOT DETECTED NOT DETECTED Final   Bordetella pertussis NOT DETECTED NOT DETECTED Final   Bordetella Parapertussis NOT DETECTED NOT DETECTED Final   Chlamydophila pneumoniae NOT DETECTED NOT DETECTED Final   Mycoplasma pneumoniae NOT  DETECTED NOT DETECTED Final    Comment: Performed at [facility], [address], [place], [place] [address]  Culture, blood (Routine X 2) w Reflex to ID Panel     Status: None   Collection Time: 11/09/23  6:16 PM   Specimen: BLOOD  Result Value Ref Range Status   Specimen Description   Final    BLOOD RIGHT ANTECUBITAL Performed at [facility], [address] [place], [place] [address]    Special Requests   Final    BOTTLES DRAWN AEROBIC ONLY Blood Culture adequate volume Performed  at [facility], [address], [place], [place] [address]    Culture   Final    NO GROWTH 11 DAYS Performed at [facility], [address], [place], [place] [address]    Report Status 11/20/2023 FINAL  Final  Culture, blood (Routine X 2) w Reflex to ID Panel     Status: None   Collection Time: 11/09/23  6:23 PM   Specimen: BLOOD  Result Value Ref Range Status   Specimen Description   Final    BLOOD BLOOD LEFT FOREARM Performed at [facility], [address], [place], [place] [address]    Special Requests   Final    BOTTLES DRAWN AEROBIC AND ANAEROBIC Blood Culture adequate volume Performed at [facility], [address], [place], [place] [address]    Culture   Final    NO GROWTH 11 DAYS Performed at [facility], [address], [place], [place] [address]    Report Status 11/20/2023 FINAL  Final  Urine Culture     Status: Abnormal   Collection Time: 11/10/23  1:58 AM   Specimen: Urine, Random  Result Value Ref Range Status   Specimen Description   Final    URINE, RANDOM Performed at [facility], [address] [place], [place] [address]    Special Requests   Final    NONE Reflexed from [id number] Performed at [facility], [address] [place], [place] [address]    Culture 60,000 COLONIES/mL ENTEROCOCCUS FAECALIS (A)  Final   Report Status 11/12/2023 FINAL  Final   Organism ID, Bacteria ENTEROCOCCUS FAECALIS (A)  Final      Susceptibility   Enterococcus faecalis - MIC*    AMPICILLIN  <=2 SENSITIVE Sensitive     NITROFURANTOIN <=16 SENSITIVE Sensitive     VANCOMYCIN  1 SENSITIVE Sensitive     * 60,000 COLONIES/mL ENTEROCOCCUS FAECALIS  Culture, blood (x 2)     Status: None   Collection Time: 11/25/23 10:31 PM   Specimen: BLOOD  Result Value Ref Range Status   Specimen Description BLOOD BLOOD LEFT ARM  Final   Special Requests   Final    BOTTLES DRAWN AEROBIC AND ANAEROBIC Blood Culture adequate volume    Culture   Final    NO GROWTH 5 DAYS Performed at [facility], [address], [place], [place] [address]    Report Status 11/30/2023 FINAL  Final  Culture, blood (x 2)     Status: None   Collection Time: 11/25/23 10:31 PM   Specimen: BLOOD  Result Value Ref Range Status   Specimen Description BLOOD BLOOD RIGHT ARM  Final   Special Requests   Final    BOTTLES DRAWN AEROBIC AND ANAEROBIC Blood Culture adequate volume   Culture   Final    NO GROWTH 5 DAYS Performed at [facility] [address] [place], [place] [address]    Report Status 11/30/2023 FINAL  Final  Gastrointestinal Panel by PCR , Stool     Status: None   Collection Time: 11/30/23  5:10 PM   Specimen: Stool  Result Value Ref Range Status   Campylobacter species NOT DETECTED NOT DETECTED Final   Plesimonas shigelloides NOT DETECTED NOT DETECTED Final   Salmonella species NOT DETECTED NOT DETECTED Final   Yersinia enterocolitica NOT DETECTED NOT DETECTED Final   Vibrio species NOT DETECTED NOT DETECTED Final   Vibrio cholerae NOT DETECTED NOT DETECTED Final   Enteroaggregative E coli (EAEC) NOT DETECTED NOT DETECTED Final   Enteropathogenic E coli (EPEC) NOT DETECTED NOT DETECTED Final   Enterotoxigenic E coli (ETEC) NOT DETECTED NOT DETECTED Final   Shiga like toxin producing E coli (STEC) NOT DETECTED NOT DETECTED Final   Shigella/Enteroinvasive E coli (EIEC) NOT DETECTED NOT DETECTED Final   Cryptosporidium NOT DETECTED NOT DETECTED Final   Cyclospora cayetanensis NOT DETECTED NOT DETECTED Final   Entamoeba histolytica NOT DETECTED NOT DETECTED Final   Giardia lamblia NOT DETECTED NOT DETECTED Final   Adenovirus F40/41 NOT DETECTED NOT DETECTED Final   Astrovirus NOT DETECTED NOT DETECTED Final   Norovirus GI/GII NOT DETECTED NOT DETECTED Final   Rotavirus A NOT DETECTED NOT DETECTED Final   Sapovirus (I, II, IV, and V) NOT DETECTED NOT DETECTED Final    Comment: Performed at [facility], [address] [place], [place] [address]    Coagulation Studies: No results for input(s): LABPROT, INR in the last 72 hours.   Urinalysis: No results for input(s): COLORURINE, LABSPEC, PHURINE, GLUCOSEU, HGBUR, BILIRUBINUR, KETONESUR, PROTEINUR, UROBILINOGEN, NITRITE, LEUKOCYTESUR in the last 72 hours.  Invalid input(s): APPERANCEUR     Imaging: No results found.       Medications:    albumin  human 60 mL/hr at 12/02/23 1300   feeding supplement (NEPRO CARB STEADY)      sodium chloride    Intravenous Once   Chlorhexidine  Gluconate Cloth  6 each Topical Q0600   epoetin  alfa-epbx (RETACRIT ) injection  10,000 Units Intravenous Q M,W,F-HD   ferrous sulfate   325 mg Per Tube Daily   fiber supplement (BANATROL TF)  60 mL Per Tube BID   free water   60 mL Per Tube Q2H   gentamicin  ointment   Topical TID   heparin  injection (subcutaneous)  5,000 Units Subcutaneous Q8H   insulin  aspart  10 Units Subcutaneous Once   lactobacillus  1 g Per Tube TID WC   midodrine   5 mg Per Tube TID WC   multivitamin  1 tablet Per Tube QHS   pantoprazole  (PROTONIX ) IV  40 mg Intravenous Q12H   acetaminophen  **OR** acetaminophen , albumin  human, artificial tears, bisacodyl , ipratropium-albuterol , [DISCONTINUED] ondansetron  **OR** ondansetron  (ZOFRAN ) IV, mouth rinse  Assessment/ Plan:  Mr. Robert Vega is a 83 y.o.  male with obstructive sleep apnea, GERD, obesity, hypertension, dysphagia, severe esophageal dysmotility with achalasia status post PEG tube placement, recent aspiration pneumonia acute respiratory failure status post extubation 10/09/2023, who was admitted to [facility] on 10/05/2023 for Aspiration into respiratory tract, initial encounter [T17.908A] Severe sepsis (HCC) [A41.9, R65.20] History of dysphagia [Z87.898] Fever, unspecified fever cause [R50.9] Multifocal pneumonia [J18.9]   1.  Acute kidney injury. Requiring hemodialysis. With  obstructive uropathy Baseline creatinine 0.81 from 10/14/2023.  Acute kidney injury secondary to ATN, obstructive uropathy, and progression of prerenal azotemia.   Patient is critically ill.  He was started on urgent hemodialysis  for severe hyperkalemia and uremia. Vascular surgery placed PermCath on 6/26.  - TOC dialysis coordinator notified of need for outpatient dialysis clinic.  Search remains in progress. - Dialysis received yesterday, UF 2L achieved.  - Next treatment scheduled for Wednesday - requiring IV albumin  with dialysis treatments for blood pressure support. - Overall prognosis is poor   2.  Sepsis and hypotension due to aspiration pneumonia, found to have distal esophageal obstruction with severe dysmotility.  He is status post botulinum toxin injection to the lower esophageal sphincter.  PEG tube placed this admission. Receiving continuous tube feeds with scheduled free water  flushes.  - Continue midodrine   4.Anemia in the setting of renal failure Lab Results  Component Value Date   HGB 7.6 (L) 12/03/2023  - Continue IV EPO with HD treatments.   5.  Bilateral hydronephrosis Noted on CT.Urology team has evaluated the patient and it is thought to be secondary to reflux.  Due to health, further intervention deferred at this time.     LOS: 59 Caili Escalera 7/8/202512:56 PM

## 2023-12-03 NOTE — Care Management Important Message (Signed)
 Important Message  Patient Details  Name: Robert Vega MRN: [id number] Date of Birth: [birth year]   Important Message Given:  Yes - Medicare IM     [name] 12/03/2023, 11:56 AM

## 2023-12-03 NOTE — Progress Notes (Signed)
 Occupational Therapy Treatment Patient Details Name: Robert Vega MRN: [id number] DOB: 28-Oct-1940 Today's Date: 12/03/2023   History of present illness 83 y.o male with significant PMH of OSA, GERD, Obesity, HTN, Dysphagia: EGD 03/2022 with food in upper esophagus complicated by aspiration event, cardiac arrest with round of CPR, and post resuscitation EGD with concern for lack of peristalsis. Pt presented to ED on 10/05/2023 with hypoxia, fever and generalized weakness; developed acute respiratory failure requiring intubation 5/10 due to aspiration pneumonia, extubated 5/15, but had significant agitation required brief course of Precedex ; pt now s/p exploratory laparotomy with closure of gastrotomy and insertion of gastrostomy tube on 10/22/23.  PT order discontinued by MD 11/10/23 and new PT consult received 11/14/23.  Pt s/p R temporary femoral vein dialysis catheter placement 11/11/23; now removed; pt now with HD perm cath.   OT comments  Mr [name] was seen for OT/PT co-treatment on this date. Upon arrival to room pt in bed, A&O x0 this session. Noted on arrival pt incontinent of stool. Pt requires TOTAL A x2 pericare bed level and rolling L+R. TOTAL A x2 sup<>sit, zero sitting balance with +3 assist. TOTAL A hand over hand face washing bed level. Pt progress limited by lethargy and fluctuating mental status. Discharge recommendation remains appropriate.   Patient assessed with multi-modal sensory stimulation techniques, as indicated below, to optimize alertness and arousal during skilled therapy session.   System Stimulus Response  Auditory Clapping/loud noise  0% response   Calling name  0% response   Command following  0% response  Tactile Light touch  0% response   Textured touch  0% response   Noxious stimuli  Withdraws from stimuli, moans, and increased eye opening up to 5 seconds with no tracking noted  Vestibular Seated EOB  Increased eye opening 5-10 seconds with minimal  tracking noted, no command following   Proprioceptive Passive ROM  Grimaces, <5 sec eye opening   Joint approximation of UE  0% response   Joint approximation of LE  Increased eye opening ~10 seconds with eye tracking noted  Gustatory  Mouthwash  Grimaces, closes mouth           If plan is discharge home, recommend the following:  Two people to help with walking and/or transfers;Two people to help with bathing/dressing/bathroom   Equipment Recommendations  Other (comment) (defer)    Recommendations for Other Services      Precautions / Restrictions Precautions Precautions: Fall Recall of Precautions/Restrictions: Impaired Precaution/Restrictions Comments: PEG; perm cath (HD) Restrictions Weight Bearing Restrictions Per Provider Order: No       Mobility Bed Mobility Overal bed mobility: Needs Assistance Bed Mobility: Rolling, Supine to Sit, Sit to Supine Rolling: Total assist, +2 for physical assistance   Supine to sit: Total assist, +2 for physical assistance, +2 for safety/equipment Sit to supine: Total assist, +2 for physical assistance, +2 for safety/equipment        Transfers                   General transfer comment: unsafe to attempt 2/2 lethargy     Balance Overall balance assessment: Needs assistance Sitting-balance support: Feet supported, Bilateral upper extremity supported Sitting balance-Leahy Scale: Zero                                     ADL either performed or assessed with clinical judgement   ADL Overall ADL's :  Needs assistance/impaired                                       General ADL Comments: TOTAL A x2 pericare bed level. TOTAL A hand over hand face washing bed level     Communication Communication Communication: Impaired Factors Affecting Communication: Other (comment)   Cognition Arousal: Stuporous   Cognition: Cognition impaired   Orientation impairments: Person, Place, Time,  Situation         OT - Cognition Comments: does not respond to name this session or follow commands.                 Following commands: Impaired        Cueing   Cueing Techniques: Verbal cues, Gestural cues, Tactile cues, Visual cues  Exercises      Shoulder Instructions       General Comments      Pertinent Vitals/ Pain       Pain Assessment Pain Assessment: PAINAD Breathing: normal Negative Vocalization: occasional moan/groan, low speech, negative/disapproving quality Facial Expression: sad, frightened, frown Body Language: relaxed Consolability: no need to console PAINAD Score: 2 Pain Location: neck with AROM Pain Descriptors / Indicators: Grimacing, Guarding Pain Intervention(s): Limited activity within patient's tolerance, Repositioned   Frequency  Min 1X/week        Progress Toward Goals  OT Goals(current goals can now be found in the care plan section)  Progress towards OT goals: Progressing toward goals  Acute Rehab OT Goals OT Goal Formulation: Patient unable to participate in goal setting Time For Goal Achievement: 12/26/23 Potential to Achieve Goals: Fair ADL Goals Pt Will Perform Grooming: with supervision;sitting Pt Will Perform Lower Body Dressing: with mod assist;with min assist;sit to/from stand Pt Will Transfer to Toilet: with mod assist;bedside commode;stand pivot transfer Pt Will Perform Toileting - Clothing Manipulation and hygiene: with min assist;sit to/from stand  Plan      Co-evaluation    PT/OT/SLP Co-Evaluation/Treatment: Yes Reason for Co-Treatment: Necessary to address cognition/behavior during functional activity;For patient/therapist safety;To address functional/ADL transfers PT goals addressed during session: Mobility/safety with mobility OT goals addressed during session: ADL's and self-care      AM-PAC OT 6 Clicks Daily Activity     Outcome Measure   Help from another person eating meals?: A Lot Help  from another person taking care of personal grooming?: Total Help from another person toileting, which includes using toliet, bedpan, or urinal?: Total Help from another person bathing (including washing, rinsing, drying)?: Total Help from another person to put on and taking off regular upper body clothing?: A Lot Help from another person to put on and taking off regular lower body clothing?: A Lot 6 Click Score: 9    End of Session    OT Visit Diagnosis: Unsteadiness on feet (R26.81);Repeated falls (R29.6);Muscle weakness (generalized) (M62.81)   Activity Tolerance Patient tolerated treatment well   Patient Left in bed;with call bell/phone within reach;with bed alarm set   Nurse Communication          Time: 8582-8556 OT Time Calculation (min): 26 min  Charges: OT General Charges $OT Visit: 1 Visit OT Treatments $Self Care/Home Management : 8-22 mins  Elston Slot, M.S. OTR/L  12/03/23, 3:44 PM  ascom [phone]

## 2023-12-03 NOTE — TOC Progression Note (Signed)
 Transition of Care [facility]) - Progression Note    Patient Details  Name: Robert Vega MRN: [id number] Date of Birth: Dec 16, 1940  Transition of Care [facility]) CM/SW Contact  [name], RN Phone Number: 12/03/2023, 9:39 AM  Clinical Narrative:     - message sent to Tammy at [facility] to determine if they have made a decision - Per Adrien at [facility] they are not able to consider unless patient has  MWF outpatient HD  HD team notified     Barriers to Discharge: Continued Medical Work up  Expected Discharge Plan and Services                                               Social Determinants of Health (SDOH) Interventions SDOH Screenings   Food Insecurity: Patient Unable To Answer (10/06/2023)  Recent Concern: Food Insecurity - Food Insecurity Present (09/11/2023)   Received from [facility]  Housing: Patient Unable To Answer (10/06/2023)  Recent Concern: Housing - High Risk (09/11/2023)   Received from [facility]  Transportation Needs: Patient Unable To Answer (10/06/2023)  Utilities: Patient Unable To Answer (10/06/2023)  Depression (PHQ2-9): Low Risk  (05/07/2023)  Financial Resource Strain: Medium Risk (09/11/2023)   Received from [facility]  Social Connections: Unknown (10/06/2023)  Tobacco Use: Medium Risk (11/05/2023)    Readmission Risk Interventions     No data to display

## 2023-12-03 NOTE — Progress Notes (Signed)
 Progress Note   Patient: Robert Vega FMW:[id number] DOB: October 27, 1940 DOA: 10/05/2023     59 DOS: the patient was seen and examined on 12/03/2023   Brief hospital course:  HPI was taken from Dr. Eldonna: Robert Vega is a 83 y.o. male with medical history significant of hypertension, sleep apnea, obesity, GERD presenting with acute febrile hypoxia, sepsis  and pneumonia.  Patient noted to be overall poor historian.  Per report, patient with increased work of breathing generalized weakness over the past 12 to 24 hours.  Was initially evaluated urgent care however EMS had to be called.  No severe weakness.  Mild cough per report.  No chest pain or abdominal pain.  No reported nausea or vomiting.  Seen by EMS with noted fever 100.7 and route.  Hypoxic to the mid 80s on room air. Presented to the ER Tmax 22.1, heart rate 90s, respirations mid 20s, BP 80s to 130s.  Requiring 4 L nasal cannula to keep O2 sats greater than 92%.  White count 7.7, hemoglobin 12, platelets 216, lactate 2.2.  COVID flu RSV negative.  Creatinine 1.  Glucose 152.  Chest x-ray with bilateral lower lobe pneumonia.  Positive pulmonary vascular congestion.   As per Dr. Lenon: Robert Vega is a 83 y.o male with significant PMH of OSA, GERD, Obesity, HTN, Dysphagia - presented to the ED 10/05/2023 from with hypoxia, fever and generalized weakness. Dx sepsis/pneumonia, concern for aspiration    Of note, EGD 03/2022 with food in upper esophagus complicated by aspiration event, cardiac arrest with round of CPR, and post resuscitation EGD with concern for lack of peristalsis. Hx prior EGD 08/2020 with note of abnormal cricopharyngeus, decrease in motility in esophagus, and spastic LES    5/10: Admit to [facility] service with sepsis due to Aspiration Pneumonia.  Course complicated by Acute Respiratory Failure due to Aspiration of vomitus w/ cardiac arrest transfer to ICU and intubation.  5/11: flexible bronchoscopy  5/13: PEG tube,  +vomiting last night, failed SAT/SBT 5/14: extubated but with severe delirium 5/20:  Botulinum toxin injection into the lower esophageal sphincter by Dr. Jinny 5/21: esophagram showing diffusely distended esophagus with distal obstruction and severe dysmotility suggestive of achalasia.  At that point patient was stabilized to point of pursuing SNF placement but then with complications of PEG tube placement resulted in return to OR 5/27 and tube feeds restarted 5/28 with zosyn  for peritonitis.   Had new melena 6/9 underwent EGD without acute bleeding noted.  Renal function declined and nephrology was consulted.  Renal function did not improve with conservative management.  Renal dysfunction thought to be secondary to ATN.  Hemodialysis was started 6/16.  Discharge will now be pending arrangement of facility and outpatient HD. His prognosis remains poor.  Patient's mental status waxes and wanes.  At points during this hospitalization, oriented to self and location and to follow simple commands but otherwise confused.    06/30 pt spiked a low grade fever and was started on IV broad sprectrum abxs, IV flagyl , vanco, cefepime . CT chest/abd/pelvis showed no evidence of abscess, study was without contrast however.  Infectious disease was consulted and saw patient 7/3 recommended treating for possible aspiration pneumonia.     07/02 -called and updated patient's granddaughter [name] who had questions about her grandfather's condition and treatment plan.  She verbalized understanding and agreement with plan.   07/03 - called and updated patient's granddaughter [name] on her grandfathers condition and treatment plan.  All questions and  concerns were addressed.     07/05 -patient's granddaughter was updated.  All questions and concerns have been addressed.   7/8 patient more drowsy.  Opens eyes with sternal rub. MRI brain chronic changes likely related to chronic hypertension.  Also notable for  global cerebral atrophy.  No evidence of anoxic brain injury.  No findings to suggest mental status change.  VBG with normal pCO2.    Assessment and Plan:  ?Infection of unclear etiology  No longer meeting SIRS criteria  On 11/25/23 noted to have leukocytosis, tachypnea, fever and unknown source.  Started on  IV flagyl , vanco, cefepime .   Patient had a CT scan of the chest/abdomen/pelvis which showed moderate bilateral hydronephrosis and hydroureter to the ureterovesicular junction without calculus or other visible obstruction likely secondary to bladder wall edema. Interval increase in extensive bilateral simple attenuation perinephric fluid. Small pleural effusion similar to prior examination.  Anasarca.   Infectious disease was consulted Recommended  UA if possible and to continue broad-spectrum antibiotics. Straight cath was attempted and was unsuccessful. Transition to IV Zosyn  only 7/3 for suspected aspiration pneumonia for 7 days (end date 07/10).    Continues to have elevated leukocytosis to 18,000.  Unable to verbalize symptoms.   - Since last CT scan of the abdomen pelvis was approximately 7 days ago and patient continues to have a leukocytosis, will repeat this. F/u CT A/P  - Will reach out to infectious disease to reevaluate patient in the a.m. - Will hold on adding additional antibiotics at this time   Acute renal failure now on HD:   Secondary to ATN as per nephro.  CT abd/pelvis 7/1 did show moderate bilateral hydronephrosis and hydroureter without calculus or other visible obstruction. Hydronephrosis first noted on 6/7 imaging.  Urology has evaluated patient and deferred intervention given patient's overall health. Would consider reaching out to them again.  Currently on  HD MWF  Failure to thrive: due to above  Currently receiving continuous tube feeds at 50 cc/h with 60 every 2 hour free water  flushes  Possible ileus Resolved.  Tolerating tube feeds, having bowel  movements  Bananatrol Added, stop bowel regimen    Normocytic anemia  Iron deficiency Stable.  Possible component of anemia of CKD.  S/p 2 units of pRBCs transfused so far.  Iron/TIBC/Ferritin/ %Sat    Component Value Date/Time   IRON 21 (L) 11/07/2023 0445   TIBC 258 11/07/2023 0445   FERRITIN 228 11/07/2023 0445   IRONPCTSAT 8 (L) 11/07/2023 0445   Continue iron supplementation Hemoglobin is stable     Achalasia & dysphagia: w/ high aspiration risk.  S/p botulinum toxin injection on 5/20, several EGD and SLP evals.  Not safe for PO intake unless family wants to accept risk of aspiration. Continue with tube feeds   Severe sepsis: resolved. Initially w/ aspiration pna. Later in hospital stay with development of  intra-abdominal infection secondary to PEG dislodged.   Acute hypoxic & hypercapnic respiratory failure: Resolved s/p intubation, ventilation & extubation.  Continue on supplemental oxygen and wean as tolerated     Acute on chronic diastolic dysfunction CHF  XR showed interstitial edema/CHF.  Fluid/volume management w/ HD      Delirium: vs mild cognitive impairment vs anoxic brain injury during critical illness in the ICU, requiring intubation & pressors.  MRI brain with no evidence of anoxic brain injury, notable for chronic vascular changes secondary to hypertension as well as history reviewed.  Continue w/ supportive care. Granddaughter asked to  d/c prn antipsychotics. Continue w/ supportive care.    GERD: continue on PPI    HTN: BP is on the low end of normal.  Hold antihypertensives    Generalized weakness: PT/OT recs SNF   Obesity: BMI 32.8.   Moderate malnutrition Secondary to acute illness Continue tube feeds    Dislodged PEG tube (11/28/23) Patient noted to have dislodged PEG tube Imaging showed  It appears likely that the retention balloon has migrated distally. Recommend retraction of the gastrostomy tube until the retention balloon  is felt at the level of the anterior gastric wall. Appreciate GI input.  PEG tube repaired         Subjective: No new complaints.  Lethargic but opens eyes to verbal stimuli  Physical Exam: Vitals:   12/03/23 0817 12/03/23 1133 12/03/23 1624 12/03/23 1924  BP: (!) 117/53 (!) 108/57 (!) 102/54 (!) 127/54  Pulse: 82 77 72 81  Resp: (!) 22 18 20 16   Temp:  98.5 F (36.9 C) (!) 97.5 F (36.4 C) 97.8 F (36.6 C)  TempSrc:  Axillary Oral Oral  SpO2: 95% 97% 93% 94%  Weight:      Height:       Physical Exam  Constitutional: In no distress.  Drowsy Cardiovascular: Normal rate, regular rhythm. No lower extremity edema  Pulmonary: Non labored breathing on nasal cannula, no wheezing or rales.  Abdominal: Soft. Normal bowel sounds. Non distended Musculoskeletal: Normal range of motion.     Neurological: Awakes to sternal rub.  Not able to further assess neurological status, patient unable to participate in exam Skin: Skin is warm and dry.     Data Reviewed:    Latest Ref Rng & Units 12/03/2023    9:59 AM 12/02/2023   10:40 AM 12/01/2023   10:06 AM  CBC  WBC 4.0 - 10.5 K/uL 18.8  18.4  18.7   Hemoglobin 13.0 - 17.0 g/dL 7.6  7.6  8.1   Hematocrit 39.0 - 52.0 % 24.6  24.8  26.6   Platelets 150 - 400 K/uL 468  [phone]      Family Communication: Granddaughter  Disposition: Status is: Inpatient Remains inpatient appropriate because: On IV antibiotics for aspiration pneumonia  Planned Discharge Destination: Skilled nursing facility    Time spent: 35 minutes  Author: Alban Pepper, MD 12/03/2023 7:40 PM  For on call review [email or web address].

## 2023-12-04 ENCOUNTER — Inpatient Hospital Stay

## 2023-12-04 DIAGNOSIS — R0902 Hypoxemia: Secondary | ICD-10-CM | POA: Diagnosis not present

## 2023-12-04 DIAGNOSIS — J690 Pneumonitis due to inhalation of food and vomit: Secondary | ICD-10-CM | POA: Diagnosis not present

## 2023-12-04 DIAGNOSIS — R627 Adult failure to thrive: Secondary | ICD-10-CM | POA: Diagnosis not present

## 2023-12-04 DIAGNOSIS — D72829 Elevated white blood cell count, unspecified: Secondary | ICD-10-CM | POA: Diagnosis not present

## 2023-12-04 DIAGNOSIS — S37019A Minor contusion of unspecified kidney, initial encounter: Secondary | ICD-10-CM

## 2023-12-04 DIAGNOSIS — Z992 Dependence on renal dialysis: Secondary | ICD-10-CM

## 2023-12-04 DIAGNOSIS — N1330 Unspecified hydronephrosis: Secondary | ICD-10-CM

## 2023-12-04 DIAGNOSIS — K220 Achalasia of cardia: Secondary | ICD-10-CM | POA: Diagnosis not present

## 2023-12-04 LAB — GLUCOSE, CAPILLARY
Glucose-Capillary: 126 mg/dL — ABNORMAL HIGH (ref 70–99)
Glucose-Capillary: 97 mg/dL (ref 70–99)

## 2023-12-04 MED ORDER — ALBUMIN HUMAN 25 % IV SOLN
INTRAVENOUS | Status: AC
  Filled 2023-12-04: qty 50

## 2023-12-04 MED ORDER — HEPARIN SODIUM (PORCINE) 1000 UNIT/ML IJ SOLN
INTRAMUSCULAR | Status: AC
  Filled 2023-12-04: qty 10

## 2023-12-04 MED ORDER — PROSOURCE TF20 ENFIT COMPATIBL EN LIQD
60.0000 mL | Freq: Every day | ENTERAL | Status: DC
  Administered 2023-12-05 – 2023-12-16 (×10): 60 mL
  Filled 2023-12-04 (×12): qty 60

## 2023-12-04 MED ORDER — FREE WATER
60.0000 mL | Freq: Every day | Status: DC
  Administered 2023-12-04 – 2023-12-05 (×4): 60 mL

## 2023-12-04 MED ORDER — NEPRO/CARBSTEADY PO LIQD
237.0000 mL | Freq: Every day | ORAL | Status: DC
  Administered 2023-12-04 – 2023-12-11 (×31): 237 mL via ORAL

## 2023-12-04 NOTE — Progress Notes (Signed)
 [facility]  ROUNDING NOTE   Subjective:   Patient seen laying in bed Awake, nonverbal today  Dialysis scheduled for later today  Objective:  Vital signs in last 24 hours:  Temp:  [97.3 F (36.3 C)-97.8 F (36.6 C)] 97.3 F (36.3 C) (07/09 1556) Pulse Rate:  [63-81] 65 (07/09 1700) Resp:  [15-22] 18 (07/09 1700) BP: (102-130)/(53-93) 124/63 (07/09 1700) SpO2:  [94 %-100 %] 97 % (07/09 1700)  Weight change:  Filed Weights   11/29/23 0830 11/29/23 1230 12/02/23 1726  Weight: 96.8 kg 95.3 kg 94.3 kg    Intake/Output: I/O last 3 completed shifts: In: 67 [Other:60] Out: -    Intake/Output this shift:  Total I/O In: 1033.3 [NG/GT:933.3; IV Piggyback:100] Out: -   Physical Exam: General: Ill appearing, laying in bed  Head: Oral mucosa moist  Eyes: Anicteric  Lungs:  Diminished  Heart: Regular rate and rhythm  Abdomen:  Peg tube in place, +distended  Extremities: no peripheral edema.  Neurologic: Alert  Skin: No lesions  Access: Rt internal jugular permcath    Basic Metabolic Panel: Recent Labs  Lab 11/29/23 0840 11/30/23 0911 12/01/23 1006 12/02/23 1040 12/03/23 0959  NA 131* 135 137 138 136  K 4.2 4.3 4.2 3.6 3.7  CL 92* 93* 98 98 97*  CO2 24 26 24 22 26   GLUCOSE 123* 132* 121* 140* 117*  BUN 94* 60* 83* 96* 65*  CREATININE 6.74* 4.50* 5.92* 7.56* 5.21*  CALCIUM  7.5* 7.6* 8.0* 7.9* 7.8*  PHOS 5.0*  --   --   --  5.7*    Liver Function Tests: Recent Labs  Lab 11/29/23 0840 12/03/23 0959  ALBUMIN  1.7* 1.8*   No results for input(s): LIPASE, AMYLASE in the last 168 hours. No results for input(s): AMMONIA in the last 168 hours.  CBC: Recent Labs  Lab 11/28/23 0931 11/29/23 0825 11/30/23 0911 12/01/23 1006 12/02/23 1040 12/03/23 0959  WBC 25.4* 24.9* 20.3* 18.7* 18.4* 18.8*  NEUTROABS 21.4* 20.1*  --   --   --  13.8*  HGB 7.2* 7.4* 7.2* 8.1* 7.6* 7.6*  HCT 22.4* 23.7* 23.0* 26.6* 24.8* 24.6*  MCV 90.3 92.9 92.0 94.0  92.9 93.2  PLT 400 441* 401* 463* 513* 468*    Cardiac Enzymes: No results for input(s): CKTOTAL, CKMB, CKMBINDEX, TROPONINI in the last 168 hours.  BNP: Invalid input(s): POCBNP  CBG: Recent Labs  Lab 12/03/23 0818 12/03/23 1112 12/03/23 1625 12/03/23 1925 12/04/23 0928  GLUCAP 131* 113* 134* 113* 126*    Microbiology: Results for orders placed or performed during the hospital encounter of 10/05/23  Blood Culture (routine x 2)     Status: None   Collection Time: 10/05/23  5:06 AM   Specimen: BLOOD  Result Value Ref Range Status   Specimen Description BLOOD LA  Final   Special Requests   Final    BOTTLES DRAWN AEROBIC AND ANAEROBIC Blood Culture results may not be optimal due to an inadequate volume of blood received in culture bottles   Culture   Final    NO GROWTH 5 DAYS Performed at [facility], [address] [place], [place] [address]    Report Status 10/10/2023 FINAL  Final  Blood Culture (routine x 2)     Status: None   Collection Time: 10/05/23  5:07 AM   Specimen: BLOOD  Result Value Ref Range Status   Specimen Description BLOOD RA  Final   Special Requests   Final  BOTTLES DRAWN AEROBIC AND ANAEROBIC Blood Culture results may not be optimal due to an inadequate volume of blood received in culture bottles   Culture   Final    NO GROWTH 5 DAYS Performed at [facility], [address] [place], [place] [address]    Report Status 10/10/2023 FINAL  Final  Resp panel by RT-PCR (RSV, Flu A&B, Covid) Anterior Nasal Swab     Status: None   Collection Time: 10/05/23  5:56 AM   Specimen: Anterior Nasal Swab  Result Value Ref Range Status   SARS Coronavirus 2 by RT PCR NEGATIVE NEGATIVE Final    Comment: (NOTE) SARS-CoV-2 target nucleic acids are NOT DETECTED.  The SARS-CoV-2 RNA is generally detectable in upper respiratory specimens during the acute phase of infection. The lowest concentration of SARS-CoV-2 viral copies this  assay can detect is 138 copies/mL. A negative result does not preclude SARS-Cov-2 infection and should not be used as the sole basis for treatment or other patient management decisions. A negative result may occur with  improper specimen collection/handling, submission of specimen other than nasopharyngeal swab, presence of viral mutation(s) within the areas targeted by this assay, and inadequate number of viral copies(<138 copies/mL). A negative result must be combined with clinical observations, patient history, and epidemiological information. The expected result is Negative.  Fact Sheet for Patients:  BloggerCourse.com  Fact Sheet for Healthcare Providers:  SeriousBroker.it  This test is no t yet approved or cleared by the [place]  FDA and  has been authorized for detection and/or diagnosis of SARS-CoV-2 by FDA under an Emergency Use Authorization (EUA). This EUA will remain  in effect (meaning this test can be used) for the duration of the COVID-19 declaration under Section 564(b)(1) of the Act, 21 U.S.C.section 360bbb-3(b)(1), unless the authorization is terminated  or revoked sooner.       Influenza A by PCR NEGATIVE NEGATIVE Final   Influenza B by PCR NEGATIVE NEGATIVE Final    Comment: (NOTE) The Xpert Xpress SARS-CoV-2/FLU/RSV plus assay is intended as an aid in the diagnosis of influenza from Nasopharyngeal swab specimens and should not be used as a sole basis for treatment. Nasal washings and aspirates are unacceptable for Xpert Xpress SARS-CoV-2/FLU/RSV testing.  Fact Sheet for Patients: BloggerCourse.com  Fact Sheet for Healthcare Providers: SeriousBroker.it  This test is not yet approved or cleared by the [place]  FDA and has been authorized for detection and/or diagnosis of SARS-CoV-2 by FDA under an Emergency Use Authorization (EUA). This EUA will  remain in effect (meaning this test can be used) for the duration of the COVID-19 declaration under Section 564(b)(1) of the Act, 21 U.S.C. section 360bbb-3(b)(1), unless the authorization is terminated or revoked.     Resp Syncytial Virus by PCR NEGATIVE NEGATIVE Final    Comment: (NOTE) Fact Sheet for Patients: BloggerCourse.com  Fact Sheet for Healthcare Providers: SeriousBroker.it  This test is not yet approved or cleared by the [place]  FDA and has been authorized for detection and/or diagnosis of SARS-CoV-2 by FDA under an Emergency Use Authorization (EUA). This EUA will remain in effect (meaning this test can be used) for the duration of the COVID-19 declaration under Section 564(b)(1) of the Act, 21 U.S.C. section 360bbb-3(b)(1), unless the authorization is terminated or revoked.  Performed at [facility], [address] [place], [place] [address]   Respiratory (~20 pathogens) panel by PCR     Status: None   Collection Time: 10/05/23  8:11 AM  Specimen: Nasopharyngeal Swab; Respiratory  Result Value Ref Range Status   Adenovirus NOT DETECTED NOT DETECTED Final   Coronavirus 229E NOT DETECTED NOT DETECTED Final    Comment: (NOTE) The Coronavirus on the Respiratory Panel, DOES NOT test for the novel  Coronavirus (2019 nCoV)    Coronavirus HKU1 NOT DETECTED NOT DETECTED Final   Coronavirus NL63 NOT DETECTED NOT DETECTED Final   Coronavirus OC43 NOT DETECTED NOT DETECTED Final   Metapneumovirus NOT DETECTED NOT DETECTED Final   Rhinovirus / Enterovirus NOT DETECTED NOT DETECTED Final   Influenza A NOT DETECTED NOT DETECTED Final   Influenza B NOT DETECTED NOT DETECTED Final   Parainfluenza Virus 1 NOT DETECTED NOT DETECTED Final   Parainfluenza Virus 2 NOT DETECTED NOT DETECTED Final   Parainfluenza Virus 3 NOT DETECTED NOT DETECTED Final   Parainfluenza Virus 4 NOT DETECTED NOT DETECTED Final    Respiratory Syncytial Virus NOT DETECTED NOT DETECTED Final   Bordetella pertussis NOT DETECTED NOT DETECTED Final   Bordetella Parapertussis NOT DETECTED NOT DETECTED Final   Chlamydophila pneumoniae NOT DETECTED NOT DETECTED Final   Mycoplasma pneumoniae NOT DETECTED NOT DETECTED Final    Comment: Performed at [facility], [address], [place], [place] [address]  Expectorated Sputum Assessment w Gram Stain, Rflx to Resp Cult     Status: None   Collection Time: 10/05/23  9:07 AM   Specimen: Sputum  Result Value Ref Range Status   Specimen Description SPUTUM  Final   Special Requests NONE  Final   Sputum evaluation   Final    Sputum specimen not acceptable for testing.  Please recollect.   C/KERRY NELSON AT 1005 10/05/23.PMF Performed at [facility], [address] [place], [place] [address]    Report Status 10/05/2023 FINAL  Final  Expectorated Sputum Assessment w Gram Stain, Rflx to Resp Cult     Status: None   Collection Time: 10/05/23 10:50 AM  Result Value Ref Range Status   Specimen Description EXPECTORATED SPUTUM  Final   Special Requests NONE  Final   Sputum evaluation   Final    THIS SPECIMEN IS ACCEPTABLE FOR SPUTUM CULTURE Performed at [facility], [address] [place], [place] [address]    Report Status 10/05/2023 FINAL  Final  Culture, Respiratory w Gram Stain     Status: None   Collection Time: 10/05/23 10:50 AM  Result Value Ref Range Status   Specimen Description   Final    EXPECTORATED SPUTUM Performed at [facility], [address] [place], [place] [address]    Special Requests   Final    NONE Reflexed from [id number] Performed at [facility], [address] [place], [place] [address]    Gram Stain   Final    RARE WBC SEEN RARE VONNE POSITIVE RODS RARE VONNE POSITIVE COCCI RARE GRAM NEGATIVE RODS    Culture   Final    FEW Normal respiratory flora-no Staph aureus or Pseudomonas seen Performed at [facility], [address], [place], [place] [address]    Report Status 10/07/2023 FINAL  Final  MRSA Next Gen by PCR, Nasal     Status: None   Collection Time: 10/06/23 12:57 AM   Specimen: Nasal Mucosa; Nasal Swab  Result Value Ref Range Status   MRSA by PCR Next Gen NOT DETECTED NOT DETECTED Final    Comment: (NOTE) The GeneXpert MRSA Assay (FDA approved for NASAL specimens only), is  one component of a comprehensive MRSA colonization surveillance program. It is not intended to diagnose MRSA infection nor to guide or monitor treatment for MRSA infections. Test performance is not FDA approved in patients less than 74 years old. Performed at [facility], [address] [place], [place] [address]   Culture, Respiratory w Gram Stain     Status: None   Collection Time: 10/06/23 11:37 AM   Specimen: INDUCED SPUTUM  Result Value Ref Range Status   Specimen Description   Final    INDUCED SPUTUM Performed at [facility], [address] [place], [place] [address]    Special Requests   Final    NONE Performed at [facility], [address] [place], [place] [address]    Gram Stain   Final    FEW WBC PRESENT,BOTH PMN AND MONONUCLEAR FEW GRAM POSITIVE RODS    Culture   Final    MODERATE LACTOBACILLUS FERMENTUM Standardized susceptibility testing for this organism is not available. Performed at [facility], [address] [place], [place] [address]    Report Status 10/09/2023 FINAL  Final  Culture, blood (x 2)     Status: None   Collection Time: 10/22/23  1:00 AM   Specimen: BLOOD  Result Value Ref Range Status   Specimen Description BLOOD BLOOD RIGHT ARM  Final   Special Requests   Final    BOTTLES DRAWN AEROBIC AND ANAEROBIC Blood Culture adequate volume   Culture   Final    NO GROWTH 5 DAYS Performed at [facility], [address], [place], [place] [address]    Report Status 10/27/2023 FINAL  Final  Culture, blood (x 2)      Status: None   Collection Time: 10/22/23  1:00 AM   Specimen: BLOOD  Result Value Ref Range Status   Specimen Description BLOOD BLOOD RIGHT ARM  Final   Special Requests   Final    BOTTLES DRAWN AEROBIC AND ANAEROBIC Blood Culture adequate volume   Culture   Final    NO GROWTH 5 DAYS Performed at [facility], [address] [place], [place] [address]    Report Status 10/27/2023 FINAL  Final  Resp panel by RT-PCR (RSV, Flu A&B, Covid) Anterior Nasal Swab     Status: None   Collection Time: 11/09/23  5:31 PM   Specimen: Anterior Nasal Swab  Result Value Ref Range Status   SARS Coronavirus 2 by RT PCR NEGATIVE NEGATIVE Final    Comment: (NOTE) SARS-CoV-2 target nucleic acids are NOT DETECTED.  The SARS-CoV-2 RNA is generally detectable in upper respiratory specimens during the acute phase of infection. The lowest concentration of SARS-CoV-2 viral copies this assay can detect is 138 copies/mL. A negative result does not preclude SARS-Cov-2 infection and should not be used as the sole basis for treatment or other patient management decisions. A negative result may occur with  improper specimen collection/handling, submission of specimen other than nasopharyngeal swab, presence of viral mutation(s) within the areas targeted by this assay, and inadequate number of viral copies(<138 copies/mL). A negative result must be combined with clinical observations, patient history, and epidemiological information. The expected result is Negative.  Fact Sheet for Patients:  BloggerCourse.com  Fact Sheet for Healthcare Providers:  SeriousBroker.it  This test is no t yet approved or cleared by the [place]  FDA and  has been authorized for detection and/or diagnosis of SARS-CoV-2 by FDA under an Emergency Use Authorization (EUA). This  EUA will remain  in effect (meaning this test can be used) for the duration of the COVID-19  declaration under Section 564(b)(1) of the Act, 21 U.S.C.section 360bbb-3(b)(1), unless the authorization is terminated  or revoked sooner.       Influenza A by PCR NEGATIVE NEGATIVE Final   Influenza B by PCR NEGATIVE NEGATIVE Final    Comment: (NOTE) The Xpert Xpress SARS-CoV-2/FLU/RSV plus assay is intended as an aid in the diagnosis of influenza from Nasopharyngeal swab specimens and should not be used as a sole basis for treatment. Nasal washings and aspirates are unacceptable for Xpert Xpress SARS-CoV-2/FLU/RSV testing.  Fact Sheet for Patients: BloggerCourse.com  Fact Sheet for Healthcare Providers: SeriousBroker.it  This test is not yet approved or cleared by the [place]  FDA and has been authorized for detection and/or diagnosis of SARS-CoV-2 by FDA under an Emergency Use Authorization (EUA). This EUA will remain in effect (meaning this test can be used) for the duration of the COVID-19 declaration under Section 564(b)(1) of the Act, 21 U.S.C. section 360bbb-3(b)(1), unless the authorization is terminated or revoked.     Resp Syncytial Virus by PCR NEGATIVE NEGATIVE Final    Comment: (NOTE) Fact Sheet for Patients: BloggerCourse.com  Fact Sheet for Healthcare Providers: SeriousBroker.it  This test is not yet approved or cleared by the [place]  FDA and has been authorized for detection and/or diagnosis of SARS-CoV-2 by FDA under an Emergency Use Authorization (EUA). This EUA will remain in effect (meaning this test can be used) for the duration of the COVID-19 declaration under Section 564(b)(1) of the Act, 21 U.S.C. section 360bbb-3(b)(1), unless the authorization is terminated or revoked.  Performed at [facility], [address] [place], [place] [address]   Respiratory (~20 pathogens) panel by PCR     Status: None   Collection Time:  11/09/23  5:31 PM   Specimen: Nasopharyngeal Swab; Respiratory  Result Value Ref Range Status   Adenovirus NOT DETECTED NOT DETECTED Final   Coronavirus 229E NOT DETECTED NOT DETECTED Final    Comment: (NOTE) The Coronavirus on the Respiratory Panel, DOES NOT test for the novel  Coronavirus (2019 nCoV)    Coronavirus HKU1 NOT DETECTED NOT DETECTED Final   Coronavirus NL63 NOT DETECTED NOT DETECTED Final   Coronavirus OC43 NOT DETECTED NOT DETECTED Final   Metapneumovirus NOT DETECTED NOT DETECTED Final   Rhinovirus / Enterovirus NOT DETECTED NOT DETECTED Final   Influenza A NOT DETECTED NOT DETECTED Final   Influenza B NOT DETECTED NOT DETECTED Final   Parainfluenza Virus 1 NOT DETECTED NOT DETECTED Final   Parainfluenza Virus 2 NOT DETECTED NOT DETECTED Final   Parainfluenza Virus 3 NOT DETECTED NOT DETECTED Final   Parainfluenza Virus 4 NOT DETECTED NOT DETECTED Final   Respiratory Syncytial Virus NOT DETECTED NOT DETECTED Final   Bordetella pertussis NOT DETECTED NOT DETECTED Final   Bordetella Parapertussis NOT DETECTED NOT DETECTED Final   Chlamydophila pneumoniae NOT DETECTED NOT DETECTED Final   Mycoplasma pneumoniae NOT DETECTED NOT DETECTED Final    Comment: Performed at [facility], [address] [place], [place] [address]  Culture, blood (Routine X 2) w Reflex to ID Panel     Status: None   Collection Time: 11/09/23  6:16 PM   Specimen: BLOOD  Result Value Ref Range Status   Specimen Description   Final    BLOOD RIGHT ANTECUBITAL Performed at [facility], [address] [place], [place] [address]  Special Requests   Final    BOTTLES DRAWN AEROBIC ONLY Blood Culture adequate volume Performed at [facility], [address] [place], [place] [address]    Culture   Final    NO GROWTH 11 DAYS Performed at [facility], [address] [place], [place] [address]    Report Status 11/20/2023 FINAL  Final  Culture, blood (Routine X  2) w Reflex to ID Panel     Status: None   Collection Time: 11/09/23  6:23 PM   Specimen: BLOOD  Result Value Ref Range Status   Specimen Description   Final    BLOOD BLOOD LEFT FOREARM Performed at [facility], [address], [place], [place] [address]    Special Requests   Final    BOTTLES DRAWN AEROBIC AND ANAEROBIC Blood Culture adequate volume Performed at [facility], [address], [place], [place] [address]    Culture   Final    NO GROWTH 11 DAYS Performed at [facility], [address] [place], [place] [address]    Report Status 11/20/2023 FINAL  Final  Urine Culture     Status: Abnormal   Collection Time: 11/10/23  1:58 AM   Specimen: Urine, Random  Result Value Ref Range Status   Specimen Description   Final    URINE, RANDOM Performed at [facility], [address] [place], [place] [address]    Special Requests   Final    NONE Reflexed from [id number] Performed at [facility], [address] [place], [place] [address]    Culture 60,000 COLONIES/mL ENTEROCOCCUS FAECALIS (A)  Final   Report Status 11/12/2023 FINAL  Final   Organism ID, Bacteria ENTEROCOCCUS FAECALIS (A)  Final      Susceptibility   Enterococcus faecalis - MIC*    AMPICILLIN  <=2 SENSITIVE Sensitive     NITROFURANTOIN <=16 SENSITIVE Sensitive     VANCOMYCIN  1 SENSITIVE Sensitive     * 60,000 COLONIES/mL ENTEROCOCCUS FAECALIS  Culture, blood (x 2)     Status: None   Collection Time: 11/25/23 10:31 PM   Specimen: BLOOD  Result Value Ref Range Status   Specimen Description BLOOD BLOOD LEFT ARM  Final   Special Requests   Final    BOTTLES DRAWN AEROBIC AND ANAEROBIC Blood Culture adequate volume   Culture   Final    NO GROWTH 5 DAYS Performed at [facility], [address], [place], [place] [address]    Report Status 11/30/2023 FINAL  Final  Culture, blood (x 2)     Status: None   Collection Time: 11/25/23 10:31 PM   Specimen: BLOOD   Result Value Ref Range Status   Specimen Description BLOOD BLOOD RIGHT ARM  Final   Special Requests   Final    BOTTLES DRAWN AEROBIC AND ANAEROBIC Blood Culture adequate volume   Culture   Final    NO GROWTH 5 DAYS Performed at [facility], [address] [place], [place] [address]    Report Status 11/30/2023 FINAL  Final  Gastrointestinal Panel by PCR , Stool     Status: None   Collection Time: 11/30/23  5:10 PM   Specimen: Stool  Result Value Ref Range Status   Campylobacter species NOT DETECTED NOT DETECTED Final   Plesimonas shigelloides NOT DETECTED NOT DETECTED Final   Salmonella species NOT DETECTED NOT DETECTED Final   Yersinia enterocolitica NOT DETECTED NOT DETECTED Final   Vibrio species  NOT DETECTED NOT DETECTED Final   Vibrio cholerae NOT DETECTED NOT DETECTED Final   Enteroaggregative E coli (EAEC) NOT DETECTED NOT DETECTED Final   Enteropathogenic E coli (EPEC) NOT DETECTED NOT DETECTED Final   Enterotoxigenic E coli (ETEC) NOT DETECTED NOT DETECTED Final   Shiga like toxin producing E coli (STEC) NOT DETECTED NOT DETECTED Final   Shigella/Enteroinvasive E coli (EIEC) NOT DETECTED NOT DETECTED Final   Cryptosporidium NOT DETECTED NOT DETECTED Final   Cyclospora cayetanensis NOT DETECTED NOT DETECTED Final   Entamoeba histolytica NOT DETECTED NOT DETECTED Final   Giardia lamblia NOT DETECTED NOT DETECTED Final   Adenovirus F40/41 NOT DETECTED NOT DETECTED Final   Astrovirus NOT DETECTED NOT DETECTED Final   Norovirus GI/GII NOT DETECTED NOT DETECTED Final   Rotavirus A NOT DETECTED NOT DETECTED Final   Sapovirus (I, II, IV, and V) NOT DETECTED NOT DETECTED Final    Comment: Performed at [facility], [address] [place], [place] [address]    Coagulation Studies: No results for input(s): LABPROT, INR in the last 72 hours.   Urinalysis: No results for input(s): COLORURINE, LABSPEC, PHURINE, GLUCOSEU, HGBUR, BILIRUBINUR,  KETONESUR, PROTEINUR, UROBILINOGEN, NITRITE, LEUKOCYTESUR in the last 72 hours.  Invalid input(s): APPERANCEUR     Imaging: CT ABDOMEN PELVIS WO CONTRAST Result Date: 12/04/2023 CLINICAL DATA:  Persistent leukocytosis. Infection of uncertain etiology. EXAM: CT ABDOMEN AND PELVIS WITHOUT CONTRAST TECHNIQUE: Multidetector CT imaging of the abdomen and pelvis was performed following the standard protocol without IV contrast. RADIATION DOSE REDUCTION: This exam was performed according to the departmental dose-optimization program which includes automated exposure control, adjustment of the mA and/or kV according to patient size and/or use of iterative reconstruction technique. COMPARISON:  CT abdomen pelvis dated 11/10/2023. FINDINGS: Evaluation of this exam is limited in the absence of intravenous contrast as well as due to respiratory motion. Lower chest: Partially visualized small bilateral pleural effusions. Large areas of consolidation involving the lower lobes bilaterally, progressed since the prior CT may represent atelectasis or infiltrate. There is 3 vessel coronary vascular calcification. No intra-abdominal free air. Hepatobiliary: The liver is unremarkable. No biliary dilatation. The gallbladder is contracted. Several gallstones. Pancreas: Unremarkable. No pancreatic ductal dilatation or surrounding inflammatory changes. Spleen: Normal in size without focal abnormality. Adrenals/Urinary Tract: The adrenal glands are unremarkable similar or slightly increased bilateral hydronephrosis and hydroureter. There is high attenuating content within the renal collecting systems bilaterally indicative of proteinaceous or hemorrhagic content. No stone. There is a moderate size left perinephric hematoma, new since the prior CT and measuring approximately 5.5 x 14 cm in greatest axial dimensions. Interval increase in the right perinephric stranding. The urinary bladder is collapsed. Stomach/Bowel:  Percutaneous gastrostomy with balloon in the body of the stomach. There is loose stool throughout the colon. There is no bowel obstruction. The appendix is normal. Vascular/Lymphatic: Mild aortoiliac atherosclerotic disease. The IVC is unremarkable. No portal venous gas. There is no adenopathy. Reproductive: The prostate is grossly unremarkable. Other: None Musculoskeletal: Osteopenia with degenerative changes of the spine. No acute osseous pathology. IMPRESSION: 1. Moderate size left perinephric hematoma, new since the prior CT. 2. Similar or slightly increased bilateral hydronephrosis and hydroureter. High attenuating content within the renal collecting systems bilaterally indicative of proteinaceous or hemorrhagic content. No stone. 3. Percutaneous gastrostomy with balloon in the body of the stomach. No bowel obstruction. Normal appendix. 4. Cholelithiasis. 5. Partially visualized small bilateral pleural effusions. Large areas of consolidation involving the lower lobes bilaterally, progressed  since the prior CT may represent atelectasis or infiltrate. 6.  Aortic Atherosclerosis (ICD10-I70.0). Electronically Signed   By: Vanetta Chou M.D.   On: 12/04/2023 10:42   MR BRAIN W WO CONTRAST Result Date: 12/03/2023 CLINICAL DATA:  Provided history: Mental status change, unknown cause. EXAM: MRI HEAD WITHOUT AND WITH CONTRAST TECHNIQUE: Multiplanar, multiecho pulse sequences of the brain and surrounding structures were obtained without and with intravenous contrast. CONTRAST:  10mL GADAVIST  GADOBUTROL  1 MMOL/ML IV SOLN COMPARISON:  Head CT 10/11/2023. Brain MRI 08/16/2021. FINDINGS: Brain: Generalized cerebral atrophy. Prominence of the ventricles and sulci, which appears commensurate. Multifocal T2 FLAIR hyperintense signal abnormality within the cerebral white matter, nonspecific but compatible with moderate chronic small vessel ischemic disease. There are a few nonspecific chronic microhemorrhages scattered  within the supratentorial brain. There is no acute infarct. No evidence of an intracranial mass. No extra-axial fluid collection. No midline shift. No pathologic intracranial enhancement identified. Vascular: Maintained flow voids within the proximal large arterial vessels. Skull and upper cervical spine: No focal worrisome marrow lesion. Sinuses/Orbits: No mass or acute finding within the imaged orbits. Mild mucosal thickening within the right maxillary and right sphenoid sinuses. Other: Trace fluid within left mastoid air cells. IMPRESSION: 1.  No evidence of an acute intracranial abnormality. 2. Moderate chronic small vessel ischemic changes within the cerebral white matter, similar to the prior MRI of 08/16/2021. 3. Few nonspecific chronic microhemorrhages within the supratentorial brain. 4. Generalized cerebral atrophy. 5. Mild mucosal thickening within the right maxillary and right sphenoid sinuses. Electronically Signed   By: Rockey Childs D.O.   On: 12/03/2023 13:41         Medications:    albumin  human 25 g (12/04/23 1630)   piperacillin -tazobactam (ZOSYN )  IV Stopped (12/04/23 0630)    sodium chloride    Intravenous Once   Chlorhexidine  Gluconate Cloth  6 each Topical Q0600   epoetin  alfa-epbx (RETACRIT ) injection  10,000 Units Intravenous Q M,W,F-HD   feeding supplement (NEPRO CARB STEADY)  237 mL Oral 5 X Daily   [START ON 12/05/2023] feeding supplement (PROSource TF20)  60 mL Per Tube Daily   ferrous sulfate   325 mg Per Tube Daily   fiber supplement (BANATROL TF)  60 mL Per Tube BID   free water   60 mL Per Tube 5 X Daily   gentamicin  ointment   Topical TID   heparin  injection (subcutaneous)  5,000 Units Subcutaneous Q8H   insulin  aspart  10 Units Subcutaneous Once   midodrine   5 mg Per Tube TID WC   multivitamin  1 tablet Per Tube QHS   pantoprazole  (PROTONIX ) IV  40 mg Intravenous Q12H   acetaminophen  **OR** acetaminophen , albumin  human, artificial tears, bisacodyl ,  ipratropium-albuterol , [DISCONTINUED] ondansetron  **OR** ondansetron  (ZOFRAN ) IV, mouth rinse  Assessment/ Plan:  Mr. Robert Vega is a 83 y.o.  male with obstructive sleep apnea, GERD, obesity, hypertension, dysphagia, severe esophageal dysmotility with achalasia status post PEG tube placement, recent aspiration pneumonia acute respiratory failure status post extubation 10/09/2023, who was admitted to [facility] on 10/05/2023 for Aspiration into respiratory tract, initial encounter [T17.908A] Severe sepsis (HCC) [A41.9, R65.20] History of dysphagia [Z87.898] Fever, unspecified fever cause [R50.9] Multifocal pneumonia [J18.9]   1.  Acute kidney injury. Requiring hemodialysis. With obstructive uropathy Baseline creatinine 0.81 from 10/14/2023.  Acute kidney injury secondary to ATN, obstructive uropathy, and progression of prerenal azotemia.   Patient is critically ill.  He was started on urgent hemodialysis for severe hyperkalemia and uremia. Vascular surgery  placed PermCath on 6/26.  - TOC dialysis coordinator notified of need for outpatient dialysis clinic.  Search remains in progress. - Dialysis scheduled for today - Next treatment scheduled for Friday - Overall prognosis is poor   2.  Sepsis and hypotension due to aspiration pneumonia, found to have distal esophageal obstruction with severe dysmotility.  He is status post botulinum toxin injection to the lower esophageal sphincter.  PEG tube placed this admission. Receiving continuous tube feeds with scheduled free water  flushes.  - Continue midodrine   4.Anemia in the setting of renal failure Lab Results  Component Value Date   HGB 7.6 (L) 12/03/2023  - Continue IV EPO with HD treatments.    5.  Bilateral hydronephrosis Noted on CT.Urology team has evaluated the patient and it is thought to be secondary to reflux.  Due to health, further intervention deferred at this time.     LOS: 60 Adreanna Fickel 7/9/20255:16 PM

## 2023-12-04 NOTE — TOC Progression Note (Signed)
 Transition of Care [facility]) - Progression Note    Patient Details  Name: Robert Vega MRN: [id number] Date of Birth: [birth year]  [facility] of [facility] CM/SW Contact  [name], RN Phone Number: 12/04/2023, 3:32 PM  Clinical Narrative:     Massie at [facility] confirms they can offer bed with TTS HD schedule Outpatient HD confirmation pending Per MD patient not medically ready for discharge  Granddaughter Bertina and Son [name] notified by secure email that at this time the only accepting facility with a TTS schedule is [facility]   Expected Discharge Plan: Skilled Nursing Facility Barriers to Discharge: Continued Medical Work up  Expected Discharge Plan and Services                                               Social Determinants of Health (SDOH) Interventions SDOH Screenings   Food Insecurity: Patient Unable To Answer (10/06/2023)  Recent Concern: Food Insecurity - Food Insecurity Present (09/11/2023)   Received from [facility]  Housing: Patient Unable To Answer (10/06/2023)  Recent Concern: Housing - High Risk (09/11/2023)   Received from [facility]  Transportation Needs: Patient Unable To Answer (10/06/2023)  Utilities: Patient Unable To Answer (10/06/2023)  Depression (PHQ2-9): Low Risk  (05/07/2023)  Financial Resource Strain: Medium Risk (09/11/2023)   Received from [facility]  Social Connections: Unknown (10/06/2023)  Tobacco Use: Medium Risk (11/05/2023)    Readmission Risk Interventions     No data to display

## 2023-12-04 NOTE — Progress Notes (Signed)
 Nutrition Follow Up Note   DOCUMENTATION CODES:   Non-severe (moderate) malnutrition in context of acute illness/injury  INTERVENTION:   Change to Nepro- Give one carton five times daily via tube- Flush with 30ml of water  before and after each feed.   ProSource TF 20- Give 60ml daily via tube, each supplement provides 80kcal and 20g of protein.   Regimen provides 2180kcal/day, 115g/day protein and 1160ml/day of free water .   Rena-vit daily via tube  Daily weights   NUTRITION DIAGNOSIS:   Moderate Malnutrition related to acute illness as evidenced by mild fat depletion, mild muscle depletion, moderate muscle depletion, percent weight loss. -ongoing   GOAL:   Patient will meet greater than or equal to 90% of their needs -met  MONITOR:   Diet advancement, Labs, Weight trends, TF tolerance, I & O's, Skin  ASSESSMENT:   83 y/o male with h/o GERD, esophageal dysphagia, HTN, BPH, mood disorder and hiatal hernia who is admitted with aspiration event, PNA, sepsis, cardiac arrest and dysphagia.  Pt remains NPO. Pt changed to continuous tube feeds yesterday per MD. RD unsure why tube feeds were changed. Tube feeds originally changed over to bolus feeds for planned outpatient HD. Pt previously aspirated on continuous feeds while laying flat during HD. Spoke with MD today; will change pt back over to bolus feeds. Pt continues to have loose stools; banatrol initiated. Per chart, pt does appear to have some weight loss since admission but unsure how much true weight loss r/t chronic volume changes. Pt does have some increased depletions on exam. Plan is for SNF at discharge.   Medications reviewed and include: epoetin , ferrous sulfate , heparin , insulin , midodrine , rena-vit, protonix , albumin , zosyn    Labs reviewed: K 3.7 wnl, BUN 65(H), creat 5.21H), P 5.7(H) Vitamin D  42.17- 7/7 Wbc- 18.8(H), Hgb 7.6(L), Hct 24.6(L) Cbgs- 126, 113, 134, 113, 131 x 48 hrs   Diet Order:   Diet Order              Diet NPO time specified Except for: Ice Chips  Diet effective now                  EDUCATION NEEDS:   No education needs have been identified at this time  Skin:  Skin Assessment: Reviewed RN Assessment (closed abdominal incision, old skin tears)  Last BM:  7/8- TYPE 7  Height:   Ht Readings from Last 1 Encounters:  10/08/23 5' 9.02 (1.753 m)    Weight:   Wt Readings from Last 1 Encounters:  12/02/23 94.3 kg    Ideal Body Weight:  72.7 kg  BMI:  Body mass index is 30.69 kg/m.  Estimated Nutritional Needs:   Kcal:  2000-2300kcal/day  Protein:  100-115g/day  Fluid:  UOP +1L  Augustin Shams MS, RD, LDN If unable to be reached, please send secure chat to RD inpatient available from 8:00a-4:00p daily

## 2023-12-04 NOTE — Progress Notes (Signed)
 Date of Admission:  10/05/2023      ID: Robert Vega is a 83 y.o. male Principal Problem:   Severe sepsis (HCC) Active Problems:   Encounter for dialysis and dialysis catheter care (HCC)   GERD (gastroesophageal reflux disease)   Dysphagia   Essential hypertension, benign   Multifocal pneumonia   Acute respiratory failure with hypoxia and hypercapnia (HCC)   History of dysphagia   Aspiration pneumonia (HCC)   Achalasia of esophagus   Acute delirium   Obesity (BMI 30-39.9)   Generalized weakness   Gastrostomy complication (HCC)   AKI (acute kidney injury) (HCC)   Generalized abdominal pain   Malnutrition of moderate degree   Melena   5/10 admission for weakness, hypoxia- aspiration pneumonia due to achlasia  5/10 CT b/l infitrates- intubated- extubated 5/14 Dilated esophagus 5/13 PEG instertion by GI 5/20 repeat EGD botulinum injection 5/25 pulled PEG out 5/27 exp lap for dislodged peg in the abdominal cavity 6/35melena 6/10 EGD 6/16 HD cath- dialysis started 6/26 permanent HD cath  Antibiotic therapy Ceftriaxone  5/10-5/14 Azithro 5/10-5/11 Metronidazole  5/10 Zosyn  5/26-5/31 Cefepime  6/30-7/3 Zosyn  7/3 >>   Asked to see patient because of persistent leucocytosis Subjective: No ros from patient In HD- moaning   Medications:   sodium chloride    Intravenous Once   Chlorhexidine  Gluconate Cloth  6 each Topical Q0600   epoetin  alfa-epbx (RETACRIT ) injection  10,000 Units Intravenous Q M,W,F-HD   ferrous sulfate   325 mg Per Tube Daily   fiber supplement (BANATROL TF)  60 mL Per Tube BID   free water   60 mL Per Tube Q2H   gentamicin  ointment   Topical TID   heparin  injection (subcutaneous)  5,000 Units Subcutaneous Q8H   insulin  aspart  10 Units Subcutaneous Once   midodrine   5 mg Per Tube TID WC   multivitamin  1 tablet Per Tube QHS   pantoprazole  (PROTONIX ) IV  40 mg Intravenous Q12H    Objective: Vital signs in last 24 hours: Patient Vitals for the  past 24 hrs:  BP Temp Temp src Pulse Resp SpO2  12/04/23 0929 (!) 122/93 97.6 F (36.4 C) Oral 74 16 100 %  12/04/23 0427 (!) 114/54 (!) 97.4 F (36.3 C) Oral 73 15 96 %  12/03/23 1924 (!) 127/54 97.8 F (36.6 C) Oral 81 16 94 %  12/03/23 1624 (!) 102/54 (!) 97.5 F (36.4 C) Oral 72 20 93 %  12/03/23 1133 (!) 108/57 98.5 F (36.9 C) Axillary 77 18 97 %     LDA HD cath PHYSICAL EXAM:  General: lethargic, does not repsond to questions, eyes open, moaning  Mouth unable to examine  Neck: symmetrical, no adenopathy, thyroid : non tender no carotid bruit and no JVD. Back: did not examine Lungs: b/l air entry Crepts bases Heart: Regular rate and rhythm, no murmur, rub or gallop. Abdomen: Soft, distended. Bowel sounds normal. No masses Extremities: atraumatic, no cyanosis. No edema. No clubbing Skin: No rashes or lesions. Or bruising CNS unable to assess Lab Results    Latest Ref Rng & Units 12/03/2023    9:59 AM 12/02/2023   10:40 AM 12/01/2023   10:06 AM  CBC  WBC 4.0 - 10.5 K/uL 18.8  18.4  18.7   Hemoglobin 13.0 - 17.0 g/dL 7.6  7.6  8.1   Hematocrit 39.0 - 52.0 % 24.6  24.8  26.6   Platelets 150 - 400 K/uL 468  513  463  Latest Ref Rng & Units 12/03/2023    9:59 AM 12/02/2023   10:40 AM 12/01/2023   10:06 AM  CMP  Glucose 70 - 99 mg/dL 882  859  878   BUN 8 - 23 mg/dL 65  96  83   Creatinine 0.61 - 1.24 mg/dL 4.78  2.43  4.07   Sodium 135 - 145 mmol/L 136  138  137   Potassium 3.5 - 5.1 mmol/L 3.7  3.6  4.2   Chloride 98 - 111 mmol/L 97  98  98   CO2 22 - 32 mmol/L 26  22  24    Calcium  8.9 - 10.3 mg/dL 7.8  7.9  8.0       Microbiology: 5/10 BC NG 5/27 BC NG 6/14 BC- NG 6/30 BC NG Studies/Results:   CT ABDOMEN PELVIS WO CONTRAST Result Date: 12/04/2023 CLINICAL DATA:  Persistent leukocytosis. Infection of uncertain etiology. EXAM: CT ABDOMEN AND PELVIS WITHOUT CONTRAST TECHNIQUE: Multidetector CT imaging of the abdomen and pelvis was performed following the  standard protocol without IV contrast. RADIATION DOSE REDUCTION: This exam was performed according to the departmental dose-optimization program which includes automated exposure control, adjustment of the mA and/or kV according to patient size and/or use of iterative reconstruction technique. COMPARISON:  CT abdomen pelvis dated 11/10/2023. FINDINGS: Evaluation of this exam is limited in the absence of intravenous contrast as well as due to respiratory motion. Lower chest: Partially visualized small bilateral pleural effusions. Large areas of consolidation involving the lower lobes bilaterally, progressed since the prior CT may represent atelectasis or infiltrate. There is 3 vessel coronary vascular calcification. No intra-abdominal free air. Hepatobiliary: The liver is unremarkable. No biliary dilatation. The gallbladder is contracted. Several gallstones. Pancreas: Unremarkable. No pancreatic ductal dilatation or surrounding inflammatory changes. Spleen: Normal in size without focal abnormality. Adrenals/Urinary Tract: The adrenal glands are unremarkable similar or slightly increased bilateral hydronephrosis and hydroureter. There is high attenuating content within the renal collecting systems bilaterally indicative of proteinaceous or hemorrhagic content. No stone. There is a moderate size left perinephric hematoma, new since the prior CT and measuring approximately 5.5 x 14 cm in greatest axial dimensions. Interval increase in the right perinephric stranding. The urinary bladder is collapsed. Stomach/Bowel: Percutaneous gastrostomy with balloon in the body of the stomach. There is loose stool throughout the colon. There is no bowel obstruction. The appendix is normal. Vascular/Lymphatic: Mild aortoiliac atherosclerotic disease. The IVC is unremarkable. No portal venous gas. There is no adenopathy. Reproductive: The prostate is grossly unremarkable. Other: None Musculoskeletal: Osteopenia with degenerative  changes of the spine. No acute osseous pathology. IMPRESSION: 1. Moderate size left perinephric hematoma, new since the prior CT. 2. Similar or slightly increased bilateral hydronephrosis and hydroureter. High attenuating content within the renal collecting systems bilaterally indicative of proteinaceous or hemorrhagic content. No stone. 3. Percutaneous gastrostomy with balloon in the body of the stomach. No bowel obstruction. Normal appendix. 4. Cholelithiasis. 5. Partially visualized small bilateral pleural effusions. Large areas of consolidation involving the lower lobes bilaterally, progressed since the prior CT may represent atelectasis or infiltrate. 6.  Aortic Atherosclerosis (ICD10-I70.0). Electronically Signed   By: Vanetta Chou M.D.   On: 12/04/2023 10:42   MR BRAIN W WO CONTRAST Result Date: 12/03/2023 CLINICAL DATA:  Provided history: Mental status change, unknown cause. EXAM: MRI HEAD WITHOUT AND WITH CONTRAST TECHNIQUE: Multiplanar, multiecho pulse sequences of the brain and surrounding structures were obtained without and with intravenous contrast. CONTRAST:  10mL GADAVIST  GADOBUTROL  1  MMOL/ML IV SOLN COMPARISON:  Head CT 10/11/2023. Brain MRI 08/16/2021. FINDINGS: Brain: Generalized cerebral atrophy. Prominence of the ventricles and sulci, which appears commensurate. Multifocal T2 FLAIR hyperintense signal abnormality within the cerebral white matter, nonspecific but compatible with moderate chronic small vessel ischemic disease. There are a few nonspecific chronic microhemorrhages scattered within the supratentorial brain. There is no acute infarct. No evidence of an intracranial mass. No extra-axial fluid collection. No midline shift. No pathologic intracranial enhancement identified. Vascular: Maintained flow voids within the proximal large arterial vessels. Skull and upper cervical spine: No focal worrisome marrow lesion. Sinuses/Orbits: No mass or acute finding within the imaged orbits.  Mild mucosal thickening within the right maxillary and right sphenoid sinuses. Other: Trace fluid within left mastoid air cells. IMPRESSION: 1.  No evidence of an acute intracranial abnormality. 2. Moderate chronic small vessel ischemic changes within the cerebral white matter, similar to the prior MRI of 08/16/2021. 3. Few nonspecific chronic microhemorrhages within the supratentorial brain. 4. Generalized cerebral atrophy. 5. Mild mucosal thickening within the right maxillary and right sphenoid sinuses. Electronically Signed   By: Rockey Childs D.O.   On: 12/03/2023 13:41     Assessment/Plan:  Prolonged and Complicated hospital stay.  Aspiration pneumonia Hypoxia - intubated and then extubated Achalasia, dilated esophagus para esophageal hernia  PEG placement Dislodgement and peritonitis- exp lap on 5/27  Leucocytosis- multifactorial- reactive to ydronephrosis, perinephrici hematoma, aspiration pneumonia,  Been on antibiotics sine 6/30 - zosyn  started on 7/3 - I doubt antibiotics are helpful- Discussed with granddaughter -would DC on 7/11( afet a week)  B/l hydroureteronephrosis- unclear etiology- bladder wall thickening. Did he have BOO No calculus CT scan today showed proteinaceous hemorrhagic material in kidneys- Will he need perc nephrostomy?  Left perinephric hematoma ? etiology Recommend urology opinion ,   AKI since 5/26 now on dialysis  Discussed the management with his granddaughter and hospitalist

## 2023-12-04 NOTE — Progress Notes (Addendum)
 Mobility Specialist - Progress Note   12/04/23 1500  Mobility  Activity Transferred from bed to chair;Turned to right side;Turned to left side;Turned to back - supine  Level of Assistance Dependent, patient does less than 25%  Assistive Device Sling  Mobility visit 1 Mobility     Pt more aroused upon arrival, awake but not verbal, only grunting. Pt transferred bed to chair via hoyer lift +2. TotalA to roll L/R. Pt ready for transport to HD tx.    Lennette Seip Mobility Specialist 12/04/23, 3:04 PM

## 2023-12-04 NOTE — Progress Notes (Addendum)
 Mobility Specialist - Progress Note   12/04/23 1000  Mobility  Activity Refused mobility     Pt lying in bed upon arrival, utilizing 1L. Pt very lethargic and only minimally responsive to painful stimuli. Pt not appropriate for OOB transfer to chair at this time. Will attempt another date.    Lennette Seip Mobility Specialist 12/04/23, 10:21 AM

## 2023-12-04 NOTE — Plan of Care (Signed)

## 2023-12-04 NOTE — Progress Notes (Signed)
 PROGRESS NOTE    Robert Vega  FMW:[id number] DOB: [birth year] DOA: 10/05/2023 PCP: Jimmy Charlie FERNS, MD    Assessment & Plan:   Principal Problem:   Severe sepsis [name] Active Problems:   Gastrostomy complication (HCC)   Dysphagia   Achalasia of esophagus   AKI (acute kidney injury) (HCC)   Acute delirium   Acute respiratory failure with hypoxia and hypercapnia (HCC)   Essential hypertension, benign   GERD (gastroesophageal reflux disease)   Encounter for dialysis and dialysis catheter care [facility])   Multifocal pneumonia   History of dysphagia   Aspiration pneumonia (HCC)   Obesity (BMI 30-39.9)   Generalized weakness   Generalized abdominal pain   Malnutrition of moderate degree   Melena  Assessment and Plan:  Failure to thrive: secondary to all below. Continue w/ full code, full scope of care as per pt's granddaughter.    SIRS: started on night on 11/25/23. W/ leukocytosis, tachypnea, fever and unknown source. Started on IV broad sprectrum abxs, IV flagyl , vanco, cefepime  initially. Continue on IV zosyn  until 7/10 as per ID. Unlikely to have acute infection as per ID.    AKI: secondary to ATN as per nephro. On HD MWF. Nephro following and recs apprec   Left perinephric hematoma: w/ b/l hydronephrosis & hydroureter. No stone. Etiology unclear. Uro consulted ( Dr. Twylla)    Hyperkalemia: WNL today    Possible ileus: resolved.    ACD: likely secondary to CKD. S/p 2 units of pRBCs transfused so far. Continue on iron supplement. H&H are labile     Achalasia & dysphagia: w/ high aspiration risk. S/p botulinum toxin injection on 5/20, several EGD and SLP evals. Not safe for PO intake unless family wants to accept risk that he will aspirate again. Continue w/ tube feeds    Severe sepsis: resolved. See Dr. Lamount note on how pt met severe sepsis criteria. Initially w/ aspiration pna. Later in hospital stay w/ intra-abdominal infection secondary to PEG dislodged.  Resolved    Acute hypoxic & hypercapnic respiratory failure: s/p intubation, ventilation & extubation.Continue on supplemental oxygen and wean as tolerated   Chronic pEF CHF: XR shows interstitial edema/CHF.  Fluid/volume management w/ HD    Delirium: vs mild cognitive impairment vs anoxic brain injury during critical illness in the ICU, requiring intubation & pressors. Mental status waxes & wanes. Continue w/ supportive care. Granddaughter asked to d/c prn antipsychotics.   GERD: continue on PPI     HTN: BP is WNL. Continue on midodrine      Generalized weakness: PT/OT recs SNF   Obesity: BMI 32.8. Would benefit from weight loss   Moderate malnutrition: continue w/ tube feeds   DVT prophylaxis: heparin   Code Status: full Family Communication: Disposition Plan: possibly d/c to SNF  Level of care: Med-Surg  Status is: Inpatient Remains inpatient appropriate because: severity of illness    Consultants:  ID ICU Nephro Uro   Procedures:   Antimicrobials: zosyn     Subjective: Pt will not answer any questions for me this morning  Objective: Vitals:   12/03/23 1624 12/03/23 1924 12/04/23 0427 12/04/23 0929  BP: (!) 102/54 (!) 127/54 (!) 114/54 (!) 122/93  Pulse: 72 81 73 74  Resp: 20 16 15 16   Temp: (!) 97.5 F (36.4 C) 97.8 F (36.6 C) (!) 97.4 F (36.3 C) 97.6 F (36.4 C)  TempSrc: Oral Oral Oral Oral  SpO2: 93% 94% 96% 100%  Weight:      Height:  Intake/Output Summary (Last 24 hours) at 12/04/2023 1136 Last data filed at 12/04/2023 0753 Gross per 24 hour  Intake 1093.33 ml  Output --  Net 1093.33 ml   Filed Weights   11/29/23 0830 11/29/23 1230 12/02/23 1726  Weight: 96.8 kg 95.3 kg 94.3 kg    Examination:  General exam: Appears calm but uncomfortable  Respiratory system: diminished breath sounds b/l  Gastrointestinal system: Abdomen is obese, soft and nontender. Hypoactive bowel sounds heard. Central nervous system: Lethargic. Moves all  extremities  Psychiatry: Judgement and insight appears poor. Flat mood and affect    Data Reviewed: I have personally reviewed following labs and imaging studies  CBC: Recent Labs  Lab 11/28/23 0931 11/29/23 0825 11/30/23 0911 12/01/23 1006 12/02/23 1040 12/03/23 0959  WBC 25.4* 24.9* 20.3* 18.7* 18.4* 18.8*  NEUTROABS 21.4* 20.1*  --   --   --  13.8*  HGB 7.2* 7.4* 7.2* 8.1* 7.6* 7.6*  HCT 22.4* 23.7* 23.0* 26.6* 24.8* 24.6*  MCV 90.3 92.9 92.0 94.0 92.9 93.2  PLT 400 441* 401* 463* 513* 468*   Basic Metabolic Panel: Recent Labs  Lab 11/29/23 0840 11/30/23 0911 12/01/23 1006 12/02/23 1040 12/03/23 0959  NA 131* 135 137 138 136  K 4.2 4.3 4.2 3.6 3.7  CL 92* 93* 98 98 97*  CO2 24 26 24 22 26   GLUCOSE 123* 132* 121* 140* 117*  BUN 94* 60* 83* 96* 65*  CREATININE 6.74* 4.50* 5.92* 7.56* 5.21*  CALCIUM  7.5* 7.6* 8.0* 7.9* 7.8*  PHOS 5.0*  --   --   --  5.7*   GFR: Estimated Creatinine Clearance: 12 mL/min (A) (by C-G formula based on SCr of 5.21 mg/dL (H)). Liver Function Tests: Recent Labs  Lab 11/29/23 0840 12/03/23 0959  ALBUMIN  1.7* 1.8*   No results for input(s): LIPASE, AMYLASE in the last 168 hours. No results for input(s): AMMONIA in the last 168 hours. Coagulation Profile: No results for input(s): INR, PROTIME in the last 168 hours. Cardiac Enzymes: No results for input(s): CKTOTAL, CKMB, CKMBINDEX, TROPONINI in the last 168 hours. BNP (last 3 results) No results for input(s): PROBNP in the last 8760 hours. HbA1C: No results for input(s): HGBA1C in the last 72 hours. CBG: Recent Labs  Lab 12/03/23 0818 12/03/23 1112 12/03/23 1625 12/03/23 1925 12/04/23 0928  GLUCAP 131* 113* 134* 113* 126*   Lipid Profile: No results for input(s): CHOL, HDL, LDLCALC, TRIG, CHOLHDL, LDLDIRECT in the last 72 hours. Thyroid  Function Tests: No results for input(s): TSH, T4TOTAL, FREET4, T3FREE, THYROIDAB in the last  72 hours. Anemia Panel: No results for input(s): VITAMINB12, FOLATE, FERRITIN, TIBC, IRON, RETICCTPCT in the last 72 hours. Sepsis Labs: No results for input(s): PROCALCITON, LATICACIDVEN in the last 168 hours.  Recent Results (from the past 240 hours)  Culture, blood (x 2)     Status: None   Collection Time: 11/25/23 10:31 PM   Specimen: BLOOD  Result Value Ref Range Status   Specimen Description BLOOD BLOOD LEFT ARM  Final   Special Requests   Final    BOTTLES DRAWN AEROBIC AND ANAEROBIC Blood Culture adequate volume   Culture   Final    NO GROWTH 5 DAYS Performed at [facility], [address], [place], [place] [address]    Report Status 11/30/2023 FINAL  Final  Culture, blood (x 2)     Status: None   Collection Time: 11/25/23 10:31 PM   Specimen: BLOOD  Result Value Ref Range Status  Specimen Description BLOOD BLOOD RIGHT ARM  Final   Special Requests   Final    BOTTLES DRAWN AEROBIC AND ANAEROBIC Blood Culture adequate volume   Culture   Final    NO GROWTH 5 DAYS Performed at [facility], [address] [place], [place] [address]    Report Status 11/30/2023 FINAL  Final  Gastrointestinal Panel by PCR , Stool     Status: None   Collection Time: 11/30/23  5:10 PM   Specimen: Stool  Result Value Ref Range Status   Campylobacter species NOT DETECTED NOT DETECTED Final   Plesimonas shigelloides NOT DETECTED NOT DETECTED Final   Salmonella species NOT DETECTED NOT DETECTED Final   Yersinia enterocolitica NOT DETECTED NOT DETECTED Final   Vibrio species NOT DETECTED NOT DETECTED Final   Vibrio cholerae NOT DETECTED NOT DETECTED Final   Enteroaggregative E coli (EAEC) NOT DETECTED NOT DETECTED Final   Enteropathogenic E coli (EPEC) NOT DETECTED NOT DETECTED Final   Enterotoxigenic E coli (ETEC) NOT DETECTED NOT DETECTED Final   Shiga like toxin producing E coli (STEC) NOT DETECTED NOT DETECTED Final   Shigella/Enteroinvasive E coli  (EIEC) NOT DETECTED NOT DETECTED Final   Cryptosporidium NOT DETECTED NOT DETECTED Final   Cyclospora cayetanensis NOT DETECTED NOT DETECTED Final   Entamoeba histolytica NOT DETECTED NOT DETECTED Final   Giardia lamblia NOT DETECTED NOT DETECTED Final   Adenovirus F40/41 NOT DETECTED NOT DETECTED Final   Astrovirus NOT DETECTED NOT DETECTED Final   Norovirus GI/GII NOT DETECTED NOT DETECTED Final   Rotavirus A NOT DETECTED NOT DETECTED Final   Sapovirus (I, II, IV, and V) NOT DETECTED NOT DETECTED Final    Comment: Performed at [facility], [address], [place], [place] [address]         Radiology Studies: CT ABDOMEN PELVIS WO CONTRAST Result Date: 12/04/2023 CLINICAL DATA:  Persistent leukocytosis. Infection of uncertain etiology. EXAM: CT ABDOMEN AND PELVIS WITHOUT CONTRAST TECHNIQUE: Multidetector CT imaging of the abdomen and pelvis was performed following the standard protocol without IV contrast. RADIATION DOSE REDUCTION: This exam was performed according to the departmental dose-optimization program which includes automated exposure control, adjustment of the mA and/or kV according to patient size and/or use of iterative reconstruction technique. COMPARISON:  CT abdomen pelvis dated 11/10/2023. FINDINGS: Evaluation of this exam is limited in the absence of intravenous contrast as well as due to respiratory motion. Lower chest: Partially visualized small bilateral pleural effusions. Large areas of consolidation involving the lower lobes bilaterally, progressed since the prior CT may represent atelectasis or infiltrate. There is 3 vessel coronary vascular calcification. No intra-abdominal free air. Hepatobiliary: The liver is unremarkable. No biliary dilatation. The gallbladder is contracted. Several gallstones. Pancreas: Unremarkable. No pancreatic ductal dilatation or surrounding inflammatory changes. Spleen: Normal in size without focal abnormality. Adrenals/Urinary Tract:  The adrenal glands are unremarkable similar or slightly increased bilateral hydronephrosis and hydroureter. There is high attenuating content within the renal collecting systems bilaterally indicative of proteinaceous or hemorrhagic content. No stone. There is a moderate size left perinephric hematoma, new since the prior CT and measuring approximately 5.5 x 14 cm in greatest axial dimensions. Interval increase in the right perinephric stranding. The urinary bladder is collapsed. Stomach/Bowel: Percutaneous gastrostomy with balloon in the body of the stomach. There is loose stool throughout the colon. There is no bowel obstruction. The appendix is normal. Vascular/Lymphatic: Mild aortoiliac atherosclerotic disease. The IVC is unremarkable. No portal venous gas. There is no  adenopathy. Reproductive: The prostate is grossly unremarkable. Other: None Musculoskeletal: Osteopenia with degenerative changes of the spine. No acute osseous pathology. IMPRESSION: 1. Moderate size left perinephric hematoma, new since the prior CT. 2. Similar or slightly increased bilateral hydronephrosis and hydroureter. High attenuating content within the renal collecting systems bilaterally indicative of proteinaceous or hemorrhagic content. No stone. 3. Percutaneous gastrostomy with balloon in the body of the stomach. No bowel obstruction. Normal appendix. 4. Cholelithiasis. 5. Partially visualized small bilateral pleural effusions. Large areas of consolidation involving the lower lobes bilaterally, progressed since the prior CT may represent atelectasis or infiltrate. 6.  Aortic Atherosclerosis (ICD10-I70.0). Electronically Signed   By: Vanetta Chou M.D.   On: 12/04/2023 10:42   MR BRAIN W WO CONTRAST Result Date: 12/03/2023 CLINICAL DATA:  Provided history: Mental status change, unknown cause. EXAM: MRI HEAD WITHOUT AND WITH CONTRAST TECHNIQUE: Multiplanar, multiecho pulse sequences of the brain and surrounding structures were  obtained without and with intravenous contrast. CONTRAST:  10mL GADAVIST  GADOBUTROL  1 MMOL/ML IV SOLN COMPARISON:  Head CT 10/11/2023. Brain MRI 08/16/2021. FINDINGS: Brain: Generalized cerebral atrophy. Prominence of the ventricles and sulci, which appears commensurate. Multifocal T2 FLAIR hyperintense signal abnormality within the cerebral white matter, nonspecific but compatible with moderate chronic small vessel ischemic disease. There are a few nonspecific chronic microhemorrhages scattered within the supratentorial brain. There is no acute infarct. No evidence of an intracranial mass. No extra-axial fluid collection. No midline shift. No pathologic intracranial enhancement identified. Vascular: Maintained flow voids within the proximal large arterial vessels. Skull and upper cervical spine: No focal worrisome marrow lesion. Sinuses/Orbits: No mass or acute finding within the imaged orbits. Mild mucosal thickening within the right maxillary and right sphenoid sinuses. Other: Trace fluid within left mastoid air cells. IMPRESSION: 1.  No evidence of an acute intracranial abnormality. 2. Moderate chronic small vessel ischemic changes within the cerebral white matter, similar to the prior MRI of 08/16/2021. 3. Few nonspecific chronic microhemorrhages within the supratentorial brain. 4. Generalized cerebral atrophy. 5. Mild mucosal thickening within the right maxillary and right sphenoid sinuses. Electronically Signed   By: Rockey Childs D.O.   On: 12/03/2023 13:41        Scheduled Meds:  sodium chloride    Intravenous Once   Chlorhexidine  Gluconate Cloth  6 each Topical Q0600   epoetin  alfa-epbx (RETACRIT ) injection  10,000 Units Intravenous Q M,W,F-HD   ferrous sulfate   325 mg Per Tube Daily   fiber supplement (BANATROL TF)  60 mL Per Tube BID   free water   60 mL Per Tube Q2H   gentamicin  ointment   Topical TID   heparin  injection (subcutaneous)  5,000 Units Subcutaneous Q8H   insulin  aspart  10  Units Subcutaneous Once   midodrine   5 mg Per Tube TID WC   multivitamin  1 tablet Per Tube QHS   pantoprazole  (PROTONIX ) IV  40 mg Intravenous Q12H   Continuous Infusions:  albumin  human 60 mL/hr at 12/04/23 0753   feeding supplement (NEPRO CARB STEADY) 50 mL/hr at 12/04/23 0753   piperacillin -tazobactam (ZOSYN )  IV Stopped (12/04/23 0630)     LOS: 60 days      Anthony CHRISTELLA Pouch, MD [facility] Pager [phone]  If 7PM-7AM, please contact night-coverage [email or web address] 12/04/2023, 11:36 AM

## 2023-12-05 DIAGNOSIS — R0902 Hypoxemia: Secondary | ICD-10-CM | POA: Diagnosis not present

## 2023-12-05 DIAGNOSIS — S37002A Unspecified injury of left kidney, initial encounter: Secondary | ICD-10-CM | POA: Diagnosis not present

## 2023-12-05 DIAGNOSIS — J690 Pneumonitis due to inhalation of food and vomit: Secondary | ICD-10-CM | POA: Diagnosis not present

## 2023-12-05 DIAGNOSIS — R627 Adult failure to thrive: Secondary | ICD-10-CM | POA: Diagnosis not present

## 2023-12-05 DIAGNOSIS — D72829 Elevated white blood cell count, unspecified: Secondary | ICD-10-CM | POA: Diagnosis not present

## 2023-12-05 DIAGNOSIS — K220 Achalasia of cardia: Secondary | ICD-10-CM | POA: Diagnosis not present

## 2023-12-05 LAB — BASIC METABOLIC PANEL WITH GFR
Anion gap: 13 (ref 5–15)
BUN: 44 mg/dL — ABNORMAL HIGH (ref 8–23)
CO2: 26 mmol/L (ref 22–32)
Calcium: 8 mg/dL — ABNORMAL LOW (ref 8.9–10.3)
Chloride: 93 mmol/L — ABNORMAL LOW (ref 98–111)
Creatinine, Ser: 4.47 mg/dL — ABNORMAL HIGH (ref 0.61–1.24)
GFR, Estimated: 12 mL/min — ABNORMAL LOW (ref >60)
Glucose, Bld: 124 mg/dL — ABNORMAL HIGH (ref 70–99)
Potassium: 3.4 mmol/L — ABNORMAL LOW (ref 3.5–5.1)
Sodium: 132 mmol/L — ABNORMAL LOW (ref 135–145)

## 2023-12-05 LAB — CBC
HCT: 23.6 % — ABNORMAL LOW (ref 39.0–52.0)
Hemoglobin: 7.3 g/dL — ABNORMAL LOW (ref 13.0–17.0)
MCH: 28.2 pg (ref 26.0–34.0)
MCHC: 30.9 g/dL (ref 30.0–36.0)
MCV: 91.1 fL (ref 80.0–100.0)
Platelets: 451 K/uL — ABNORMAL HIGH (ref 150–400)
RBC: 2.59 MIL/uL — ABNORMAL LOW (ref 4.22–5.81)
RDW: 15.7 % — ABNORMAL HIGH (ref 11.5–15.5)
WBC: 17.1 K/uL — ABNORMAL HIGH (ref 4.0–10.5)
nRBC: 0 % (ref 0.0–0.2)

## 2023-12-05 LAB — GLUCOSE, CAPILLARY
Glucose-Capillary: 119 mg/dL — ABNORMAL HIGH (ref 70–99)
Glucose-Capillary: 129 mg/dL — ABNORMAL HIGH (ref 70–99)
Glucose-Capillary: 120 mg/dL — ABNORMAL HIGH (ref 70–99)

## 2023-12-05 MED ORDER — FREE WATER
120.0000 mL | Freq: Every day | Status: DC
  Administered 2023-12-05 – 2023-12-16 (×51): 120 mL

## 2023-12-05 NOTE — TOC Progression Note (Signed)
 Transition of Care [facility]) - Progression Note    Patient Details  Name: Robert Vega MRN: [id number] Date of Birth: 07/22/1940  [facility] CM/SW Contact  [name], RN Phone Number: 12/05/2023, 10:46 AM  Clinical Narrative:      Case reviewed with [facility] supervisor.  Reached out to MD to discuss potentially need for interdisciplinary family meeting   Expected Discharge Plan: Skilled Nursing Facility Barriers to Discharge: Continued Medical Work up  Expected Discharge Plan and Services                                               Social Determinants of Health (SDOH) Interventions SDOH Screenings   Food Insecurity: Patient Unable To Answer (10/06/2023)  Recent Concern: Food Insecurity - Food Insecurity Present (09/11/2023)   Received from [facility]  Housing: Patient Unable To Answer (10/06/2023)  Recent Concern: Housing - High Risk (09/11/2023)   Received from [facility]  Transportation Needs: Patient Unable To Answer (10/06/2023)  Utilities: Patient Unable To Answer (10/06/2023)  Depression (PHQ2-9): Low Risk  (05/07/2023)  Financial Resource Strain: Medium Risk (09/11/2023)   Received from [facility]  Social Connections: Unknown (10/06/2023)  Tobacco Use: Medium Risk (11/05/2023)    Readmission Risk Interventions     No data to display

## 2023-12-05 NOTE — Progress Notes (Signed)
 Patients accepted at [address]  Start Thursday July 17,2025 Days's Tues/Thurs/Sat  Chair time 07:10 am

## 2023-12-05 NOTE — Progress Notes (Signed)
 Nutrition Brief Follow Up Note   Received a message from medical team that pt's granddaughter is requesting to speak with an RD. Spoke with pt's granddaughter Robert Vega via the phone. Granddaughter with questions regarding pt's tube feed regimen as she had previously requested continuous feeds. Discussed with granddaughter that pt was changed to bolus feeds to better accommodate pt's plan for outpatient HD. Continuous feeds would need to be stopped and held for long periods of time r/t HD. Granddaughter is agreeable to bolus feeds. She is concerned about pt getting adequate hydration as she feels his mental status is better when he gets more free water . RD Spoke with Nephrology who is in agreement to provide free water  flushes with tube feeds; this was ordered by RD.   INTERVENTION:   Nepro- Give one carton five times daily via tube- Flush with 60ml of water  before and after each feed.   ProSource TF 20- Give 60ml daily via tube, each supplement provides 80kcal and 20g of protein.   Regimen provides 2180kcal/day, 115g/day protein and 1438ml/day of free water .   Rena-vit daily via tube  Daily weights   Estimated Nutritional Needs:   Kcal:  2000-2300kcal/day  Protein:  100-115g/day  Fluid:  UOP +1L  Robert Shams MS, RD, LDN If unable to be reached, please send secure chat to RD inpatient available from 8:00a-4:00p daily

## 2023-12-05 NOTE — Progress Notes (Signed)
 [facility]  ROUNDING NOTE   Subjective:   Patient seen laying in bed Awake, not responsive. 2 L nasal cannula  Objective:  Vital signs in last 24 hours:  Temp:  [97 F (36.1 C)-98.9 F (37.2 C)] 97.8 F (36.6 C) (07/10 0718) Pulse Rate:  [59-83] 69 (07/10 0718) Resp:  [16-26] 16 (07/10 0718) BP: (102-140)/(53-108) 112/60 (07/10 0718) SpO2:  [95 %-100 %] 98 % (07/10 0718)  Weight change:  Filed Weights   11/29/23 0830 11/29/23 1230 12/02/23 1726  Weight: 96.8 kg 95.3 kg 94.3 kg    Intake/Output: I/O last 3 completed shifts: In: 1093.3 [Other:60; NG/GT:933.3; IV Piggyback:100] Out: -    Intake/Output this shift:  No intake/output data recorded.  Physical Exam: General: Chronically ill appearing, laying in bed  Head: Oral mucosa moist  Eyes: Anicteric  Lungs:  Diminished  Heart: Regular rate and rhythm  Abdomen:  Peg tube in place, +distended  Extremities: no peripheral edema.  Neurologic: Alert  Skin: No lesions  Access: Rt internal jugular permcath    Basic Metabolic Panel: Recent Labs  Lab 11/29/23 0840 11/30/23 0911 12/01/23 1006 12/02/23 1040 12/03/23 0959 12/05/23 0522  NA 131* 135 137 138 136 132*  K 4.2 4.3 4.2 3.6 3.7 3.4*  CL 92* 93* 98 98 97* 93*  CO2 24 26 24 22 26 26   GLUCOSE 123* 132* 121* 140* 117* 124*  BUN 94* 60* 83* 96* 65* 44*  CREATININE 6.74* 4.50* 5.92* 7.56* 5.21* 4.47*  CALCIUM  7.5* 7.6* 8.0* 7.9* 7.8* 8.0*  PHOS 5.0*  --   --   --  5.7*  --     Liver Function Tests: Recent Labs  Lab 11/29/23 0840 12/03/23 0959  ALBUMIN  1.7* 1.8*   No results for input(s): LIPASE, AMYLASE in the last 168 hours. No results for input(s): AMMONIA in the last 168 hours.  CBC: Recent Labs  Lab 11/29/23 0825 11/30/23 0911 12/01/23 1006 12/02/23 1040 12/03/23 0959 12/05/23 0522  WBC 24.9* 20.3* 18.7* 18.4* 18.8* 17.1*  NEUTROABS 20.1*  --   --   --  13.8*  --   HGB 7.4* 7.2* 8.1* 7.6* 7.6* 7.3*  HCT 23.7*  23.0* 26.6* 24.8* 24.6* 23.6*  MCV 92.9 92.0 94.0 92.9 93.2 91.1  PLT 441* 401* 463* 513* 468* 451*    Cardiac Enzymes: No results for input(s): CKTOTAL, CKMB, CKMBINDEX, TROPONINI in the last 168 hours.  BNP: Invalid input(s): POCBNP  CBG: Recent Labs  Lab 12/03/23 1925 12/04/23 0928 12/04/23 2242 12/05/23 0720 12/05/23 1133  GLUCAP 113* 126* 97 119* 129*    Microbiology: Results for orders placed or performed during the hospital encounter of 10/05/23  Blood Culture (routine x 2)     Status: None   Collection Time: 10/05/23  5:06 AM   Specimen: BLOOD  Result Value Ref Range Status   Specimen Description BLOOD LA  Final   Special Requests   Final    BOTTLES DRAWN AEROBIC AND ANAEROBIC Blood Culture results may not be optimal due to an inadequate volume of blood received in culture bottles   Culture   Final    NO GROWTH 5 DAYS Performed at [facility], [address] [place], [place] [address]    Report Status 10/10/2023 FINAL  Final  Blood Culture (routine x 2)     Status: None   Collection Time: 10/05/23  5:07 AM   Specimen: BLOOD  Result Value Ref Range Status   Specimen Description BLOOD RA  Final   Special Requests   Final    BOTTLES DRAWN AEROBIC AND ANAEROBIC Blood Culture results may not be optimal due to an inadequate volume of blood received in culture bottles   Culture   Final    NO GROWTH 5 DAYS Performed at [facility], [address] [place], [place] [address]    Report Status 10/10/2023 FINAL  Final  Resp panel by RT-PCR (RSV, Flu A&B, Covid) Anterior Nasal Swab     Status: None   Collection Time: 10/05/23  5:56 AM   Specimen: Anterior Nasal Swab  Result Value Ref Range Status   SARS Coronavirus 2 by RT PCR NEGATIVE NEGATIVE Final    Comment: (NOTE) SARS-CoV-2 target nucleic acids are NOT DETECTED.  The SARS-CoV-2 RNA is generally detectable in upper respiratory specimens during the acute phase of infection. The  lowest concentration of SARS-CoV-2 viral copies this assay can detect is 138 copies/mL. A negative result does not preclude SARS-Cov-2 infection and should not be used as the sole basis for treatment or other patient management decisions. A negative result may occur with  improper specimen collection/handling, submission of specimen other than nasopharyngeal swab, presence of viral mutation(s) within the areas targeted by this assay, and inadequate number of viral copies(<138 copies/mL). A negative result must be combined with clinical observations, patient history, and epidemiological information. The expected result is Negative.  Fact Sheet for Patients:  BloggerCourse.com  Fact Sheet for Healthcare Providers:  SeriousBroker.it  This test is no t yet approved or cleared by the [place]  FDA and  has been authorized for detection and/or diagnosis of SARS-CoV-2 by FDA under an Emergency Use Authorization (EUA). This EUA will remain  in effect (meaning this test can be used) for the duration of the COVID-19 declaration under Section 564(b)(1) of the Act, 21 U.S.C.section 360bbb-3(b)(1), unless the authorization is terminated  or revoked sooner.       Influenza A by PCR NEGATIVE NEGATIVE Final   Influenza B by PCR NEGATIVE NEGATIVE Final    Comment: (NOTE) The Xpert Xpress SARS-CoV-2/FLU/RSV plus assay is intended as an aid in the diagnosis of influenza from Nasopharyngeal swab specimens and should not be used as a sole basis for treatment. Nasal washings and aspirates are unacceptable for Xpert Xpress SARS-CoV-2/FLU/RSV testing.  Fact Sheet for Patients: BloggerCourse.com  Fact Sheet for Healthcare Providers: SeriousBroker.it  This test is not yet approved or cleared by the [place]  FDA and has been authorized for detection and/or diagnosis of SARS-CoV-2 by FDA under  an Emergency Use Authorization (EUA). This EUA will remain in effect (meaning this test can be used) for the duration of the COVID-19 declaration under Section 564(b)(1) of the Act, 21 U.S.C. section 360bbb-3(b)(1), unless the authorization is terminated or revoked.     Resp Syncytial Virus by PCR NEGATIVE NEGATIVE Final    Comment: (NOTE) Fact Sheet for Patients: BloggerCourse.com  Fact Sheet for Healthcare Providers: SeriousBroker.it  This test is not yet approved or cleared by the [place]  FDA and has been authorized for detection and/or diagnosis of SARS-CoV-2 by FDA under an Emergency Use Authorization (EUA). This EUA will remain in effect (meaning this test can be used) for the duration of the COVID-19 declaration under Section 564(b)(1) of the Act, 21 U.S.C. section 360bbb-3(b)(1), unless the authorization is terminated or revoked.  Performed at [facility], [address] [place], [place] [address]   Respiratory (~20 pathogens) panel by PCR     Status:  None   Collection Time: 10/05/23  8:11 AM   Specimen: Nasopharyngeal Swab; Respiratory  Result Value Ref Range Status   Adenovirus NOT DETECTED NOT DETECTED Final   Coronavirus 229E NOT DETECTED NOT DETECTED Final    Comment: (NOTE) The Coronavirus on the Respiratory Panel, DOES NOT test for the novel  Coronavirus (2019 nCoV)    Coronavirus HKU1 NOT DETECTED NOT DETECTED Final   Coronavirus NL63 NOT DETECTED NOT DETECTED Final   Coronavirus OC43 NOT DETECTED NOT DETECTED Final   Metapneumovirus NOT DETECTED NOT DETECTED Final   Rhinovirus / Enterovirus NOT DETECTED NOT DETECTED Final   Influenza A NOT DETECTED NOT DETECTED Final   Influenza B NOT DETECTED NOT DETECTED Final   Parainfluenza Virus 1 NOT DETECTED NOT DETECTED Final   Parainfluenza Virus 2 NOT DETECTED NOT DETECTED Final   Parainfluenza Virus 3 NOT DETECTED NOT DETECTED Final    Parainfluenza Virus 4 NOT DETECTED NOT DETECTED Final   Respiratory Syncytial Virus NOT DETECTED NOT DETECTED Final   Bordetella pertussis NOT DETECTED NOT DETECTED Final   Bordetella Parapertussis NOT DETECTED NOT DETECTED Final   Chlamydophila pneumoniae NOT DETECTED NOT DETECTED Final   Mycoplasma pneumoniae NOT DETECTED NOT DETECTED Final    Comment: Performed at [facility], [address] [place], [place] [address]  Expectorated Sputum Assessment w Gram Stain, Rflx to Resp Cult     Status: None   Collection Time: 10/05/23  9:07 AM   Specimen: Sputum  Result Value Ref Range Status   Specimen Description SPUTUM  Final   Special Requests NONE  Final   Sputum evaluation   Final    Sputum specimen not acceptable for testing.  Please recollect.   C/KERRY NELSON AT 1005 10/05/23.PMF Performed at [facility], [address] [place], [place] [address]    Report Status 10/05/2023 FINAL  Final  Expectorated Sputum Assessment w Gram Stain, Rflx to Resp Cult     Status: None   Collection Time: 10/05/23 10:50 AM  Result Value Ref Range Status   Specimen Description EXPECTORATED SPUTUM  Final   Special Requests NONE  Final   Sputum evaluation   Final    THIS SPECIMEN IS ACCEPTABLE FOR SPUTUM CULTURE Performed at [facility], [address], [place], [place] [address]    Report Status 10/05/2023 FINAL  Final  Culture, Respiratory w Gram Stain     Status: None   Collection Time: 10/05/23 10:50 AM  Result Value Ref Range Status   Specimen Description   Final    EXPECTORATED SPUTUM Performed at [facility], [address] [place], [place] [address]    Special Requests   Final    NONE Reflexed from [phone] Performed at [facility], [address] [place], [place] [address]    Gram Stain   Final    RARE WBC SEEN RARE VONNE POSITIVE RODS RARE VONNE POSITIVE COCCI RARE GRAM NEGATIVE RODS    Culture   Final    FEW Normal respiratory  flora-no Staph aureus or Pseudomonas seen Performed at [facility], [address], [place], [place] [address]    Report Status 10/07/2023 FINAL  Final  MRSA Next Gen by PCR, Nasal     Status: None   Collection Time: 10/06/23 12:57 AM   Specimen: Nasal Mucosa; Nasal Swab  Result Value Ref Range Status   MRSA by PCR Next Gen NOT DETECTED NOT DETECTED Final    Comment: (NOTE)  The GeneXpert MRSA Assay (FDA approved for NASAL specimens only), is one component of a comprehensive MRSA colonization surveillance program. It is not intended to diagnose MRSA infection nor to guide or monitor treatment for MRSA infections. Test performance is not FDA approved in patients less than 29 years old. Performed at [facility], [address] [place], [place] [address]   Culture, Respiratory w Gram Stain     Status: None   Collection Time: 10/06/23 11:37 AM   Specimen: INDUCED SPUTUM  Result Value Ref Range Status   Specimen Description   Final    INDUCED SPUTUM Performed at [facility], [address], [place], [place] [address]    Special Requests   Final    NONE Performed at [facility], [address] [place], [place] [address]    Gram Stain   Final    FEW WBC PRESENT,BOTH PMN AND MONONUCLEAR FEW GRAM POSITIVE RODS    Culture   Final    MODERATE LACTOBACILLUS FERMENTUM Standardized susceptibility testing for this organism is not available. Performed at [facility], [address], [place], [place] [address]    Report Status 10/09/2023 FINAL  Final  Culture, blood (x 2)     Status: None   Collection Time: 10/22/23  1:00 AM   Specimen: BLOOD  Result Value Ref Range Status   Specimen Description BLOOD BLOOD RIGHT ARM  Final   Special Requests   Final    BOTTLES DRAWN AEROBIC AND ANAEROBIC Blood Culture adequate volume   Culture   Final    NO GROWTH 5 DAYS Performed at [facility], [address] [place], [place] [address]     Report Status 10/27/2023 FINAL  Final  Culture, blood (x 2)     Status: None   Collection Time: 10/22/23  1:00 AM   Specimen: BLOOD  Result Value Ref Range Status   Specimen Description BLOOD BLOOD RIGHT ARM  Final   Special Requests   Final    BOTTLES DRAWN AEROBIC AND ANAEROBIC Blood Culture adequate volume   Culture   Final    NO GROWTH 5 DAYS Performed at [facility], [address] [place], [place] [address]    Report Status 10/27/2023 FINAL  Final  Resp panel by RT-PCR (RSV, Flu A&B, Covid) Anterior Nasal Swab     Status: None   Collection Time: 11/09/23  5:31 PM   Specimen: Anterior Nasal Swab  Result Value Ref Range Status   SARS Coronavirus 2 by RT PCR NEGATIVE NEGATIVE Final    Comment: (NOTE) SARS-CoV-2 target nucleic acids are NOT DETECTED.  The SARS-CoV-2 RNA is generally detectable in upper respiratory specimens during the acute phase of infection. The lowest concentration of SARS-CoV-2 viral copies this assay can detect is 138 copies/mL. A negative result does not preclude SARS-Cov-2 infection and should not be used as the sole basis for treatment or other patient management decisions. A negative result may occur with  improper specimen collection/handling, submission of specimen other than nasopharyngeal swab, presence of viral mutation(s) within the areas targeted by this assay, and inadequate number of viral copies(<138 copies/mL). A negative result must be combined with clinical observations, patient history, and epidemiological information. The expected result is Negative.  Fact Sheet for Patients:  BloggerCourse.com  Fact Sheet for Healthcare Providers:  SeriousBroker.it  This test is no t yet approved or cleared by the [place]  FDA and  has been authorized for detection and/or diagnosis  of SARS-CoV-2 by FDA under an Emergency Use Authorization (EUA). This EUA will remain  in effect  (meaning this test can be used) for the duration of the COVID-19 declaration under Section 564(b)(1) of the Act, 21 U.S.C.section 360bbb-3(b)(1), unless the authorization is terminated  or revoked sooner.       Influenza A by PCR NEGATIVE NEGATIVE Final   Influenza B by PCR NEGATIVE NEGATIVE Final    Comment: (NOTE) The Xpert Xpress SARS-CoV-2/FLU/RSV plus assay is intended as an aid in the diagnosis of influenza from Nasopharyngeal swab specimens and should not be used as a sole basis for treatment. Nasal washings and aspirates are unacceptable for Xpert Xpress SARS-CoV-2/FLU/RSV testing.  Fact Sheet for Patients: BloggerCourse.com  Fact Sheet for Healthcare Providers: SeriousBroker.it  This test is not yet approved or cleared by the [place]  FDA and has been authorized for detection and/or diagnosis of SARS-CoV-2 by FDA under an Emergency Use Authorization (EUA). This EUA will remain in effect (meaning this test can be used) for the duration of the COVID-19 declaration under Section 564(b)(1) of the Act, 21 U.S.C. section 360bbb-3(b)(1), unless the authorization is terminated or revoked.     Resp Syncytial Virus by PCR NEGATIVE NEGATIVE Final    Comment: (NOTE) Fact Sheet for Patients: BloggerCourse.com  Fact Sheet for Healthcare Providers: SeriousBroker.it  This test is not yet approved or cleared by the [place]  FDA and has been authorized for detection and/or diagnosis of SARS-CoV-2 by FDA under an Emergency Use Authorization (EUA). This EUA will remain in effect (meaning this test can be used) for the duration of the COVID-19 declaration under Section 564(b)(1) of the Act, 21 U.S.C. section 360bbb-3(b)(1), unless the authorization is terminated or revoked.  Performed at [facility], [address] [place], [place] [address]   Respiratory (~20  pathogens) panel by PCR     Status: None   Collection Time: 11/09/23  5:31 PM   Specimen: Nasopharyngeal Swab; Respiratory  Result Value Ref Range Status   Adenovirus NOT DETECTED NOT DETECTED Final   Coronavirus 229E NOT DETECTED NOT DETECTED Final    Comment: (NOTE) The Coronavirus on the Respiratory Panel, DOES NOT test for the novel  Coronavirus (2019 nCoV)    Coronavirus HKU1 NOT DETECTED NOT DETECTED Final   Coronavirus NL63 NOT DETECTED NOT DETECTED Final   Coronavirus OC43 NOT DETECTED NOT DETECTED Final   Metapneumovirus NOT DETECTED NOT DETECTED Final   Rhinovirus / Enterovirus NOT DETECTED NOT DETECTED Final   Influenza A NOT DETECTED NOT DETECTED Final   Influenza B NOT DETECTED NOT DETECTED Final   Parainfluenza Virus 1 NOT DETECTED NOT DETECTED Final   Parainfluenza Virus 2 NOT DETECTED NOT DETECTED Final   Parainfluenza Virus 3 NOT DETECTED NOT DETECTED Final   Parainfluenza Virus 4 NOT DETECTED NOT DETECTED Final   Respiratory Syncytial Virus NOT DETECTED NOT DETECTED Final   Bordetella pertussis NOT DETECTED NOT DETECTED Final   Bordetella Parapertussis NOT DETECTED NOT DETECTED Final   Chlamydophila pneumoniae NOT DETECTED NOT DETECTED Final   Mycoplasma pneumoniae NOT DETECTED NOT DETECTED Final    Comment: Performed at [facility], [address], [place], [place] [address]  Culture, blood (Routine X 2) w Reflex to ID Panel     Status: None   Collection Time: 11/09/23  6:16 PM   Specimen: BLOOD  Result Value Ref Range Status   Specimen Description   Final    BLOOD RIGHT ANTECUBITAL Performed at [facility], [address] [place], [place] [address]    Special Requests   Final    BOTTLES DRAWN AEROBIC ONLY Blood Culture adequate volume Performed at [facility], [address] [place], [place] [address]    Culture   Final    NO GROWTH 11 DAYS Performed at [facility], [address] [place], [place] [address]     Report Status 11/20/2023 FINAL  Final  Culture, blood (Routine X 2) w Reflex to ID Panel     Status: None   Collection Time: 11/09/23  6:23 PM   Specimen: BLOOD  Result Value Ref Range Status   Specimen Description   Final    BLOOD BLOOD LEFT FOREARM Performed at [facility] [address], [place], [place] [address]    Special Requests   Final    BOTTLES DRAWN AEROBIC AND ANAEROBIC Blood Culture adequate volume Performed at [facility], [address], [place], [place] [address]    Culture   Final    NO GROWTH 11 DAYS Performed at [facility], [address], [place], [place] [address]    Report Status 11/20/2023 FINAL  Final  Urine Culture     Status: Abnormal   Collection Time: 11/10/23  1:58 AM   Specimen: Urine, Random  Result Value Ref Range Status   Specimen Description   Final    URINE, RANDOM Performed at [facility], [address] [place], [place] [address]    Special Requests   Final    NONE Reflexed from [phone] Performed at [facility], [address] [place], [place] [address]    Culture 60,000 COLONIES/mL ENTEROCOCCUS FAECALIS (A)  Final   Report Status 11/12/2023 FINAL  Final   Organism ID, Bacteria ENTEROCOCCUS FAECALIS (A)  Final      Susceptibility   Enterococcus faecalis - MIC*    AMPICILLIN  <=2 SENSITIVE Sensitive     NITROFURANTOIN <=16 SENSITIVE Sensitive     VANCOMYCIN  1 SENSITIVE Sensitive     * 60,000 COLONIES/mL ENTEROCOCCUS FAECALIS  Culture, blood (x 2)     Status: None   Collection Time: 11/25/23 10:31 PM   Specimen: BLOOD  Result Value Ref Range Status   Specimen Description BLOOD BLOOD LEFT ARM  Final   Special Requests   Final    BOTTLES DRAWN AEROBIC AND ANAEROBIC Blood Culture adequate volume   Culture   Final    NO GROWTH 5 DAYS Performed at [facility], [address], [place], [place] [address]    Report Status 11/30/2023 FINAL  Final  Culture, blood (x 2)      Status: None   Collection Time: 11/25/23 10:31 PM   Specimen: BLOOD  Result Value Ref Range Status   Specimen Description BLOOD BLOOD RIGHT ARM  Final   Special Requests   Final    BOTTLES DRAWN AEROBIC AND ANAEROBIC Blood Culture adequate volume   Culture   Final    NO GROWTH 5 DAYS Performed at [facility], [address], [place], [place] [address]    Report Status 11/30/2023 FINAL  Final  Gastrointestinal Panel by PCR , Stool     Status: None   Collection Time: 11/30/23  5:10 PM   Specimen: Stool  Result Value Ref Range Status   Campylobacter species NOT DETECTED NOT DETECTED Final   Plesimonas shigelloides NOT DETECTED NOT DETECTED Final   Salmonella species NOT DETECTED NOT DETECTED Final  Yersinia enterocolitica NOT DETECTED NOT DETECTED Final   Vibrio species NOT DETECTED NOT DETECTED Final   Vibrio cholerae NOT DETECTED NOT DETECTED Final   Enteroaggregative E coli (EAEC) NOT DETECTED NOT DETECTED Final   Enteropathogenic E coli (EPEC) NOT DETECTED NOT DETECTED Final   Enterotoxigenic E coli (ETEC) NOT DETECTED NOT DETECTED Final   Shiga like toxin producing E coli (STEC) NOT DETECTED NOT DETECTED Final   Shigella/Enteroinvasive E coli (EIEC) NOT DETECTED NOT DETECTED Final   Cryptosporidium NOT DETECTED NOT DETECTED Final   Cyclospora cayetanensis NOT DETECTED NOT DETECTED Final   Entamoeba histolytica NOT DETECTED NOT DETECTED Final   Giardia lamblia NOT DETECTED NOT DETECTED Final   Adenovirus F40/41 NOT DETECTED NOT DETECTED Final   Astrovirus NOT DETECTED NOT DETECTED Final   Norovirus GI/GII NOT DETECTED NOT DETECTED Final   Rotavirus A NOT DETECTED NOT DETECTED Final   Sapovirus (I, II, IV, and V) NOT DETECTED NOT DETECTED Final    Comment: Performed at [facility], [address] [place], [place] [address]    Coagulation Studies: No results for input(s): LABPROT, INR in the last 72 hours.   Urinalysis: No results for input(s):  COLORURINE, LABSPEC, PHURINE, GLUCOSEU, HGBUR, BILIRUBINUR, KETONESUR, PROTEINUR, UROBILINOGEN, NITRITE, LEUKOCYTESUR in the last 72 hours.  Invalid input(s): APPERANCEUR     Imaging: CT ABDOMEN PELVIS WO CONTRAST Result Date: 12/04/2023 CLINICAL DATA:  Persistent leukocytosis. Infection of uncertain etiology. EXAM: CT ABDOMEN AND PELVIS WITHOUT CONTRAST TECHNIQUE: Multidetector CT imaging of the abdomen and pelvis was performed following the standard protocol without IV contrast. RADIATION DOSE REDUCTION: This exam was performed according to the departmental dose-optimization program which includes automated exposure control, adjustment of the mA and/or kV according to patient size and/or use of iterative reconstruction technique. COMPARISON:  CT abdomen pelvis dated 11/10/2023. FINDINGS: Evaluation of this exam is limited in the absence of intravenous contrast as well as due to respiratory motion. Lower chest: Partially visualized small bilateral pleural effusions. Large areas of consolidation involving the lower lobes bilaterally, progressed since the prior CT may represent atelectasis or infiltrate. There is 3 vessel coronary vascular calcification. No intra-abdominal free air. Hepatobiliary: The liver is unremarkable. No biliary dilatation. The gallbladder is contracted. Several gallstones. Pancreas: Unremarkable. No pancreatic ductal dilatation or surrounding inflammatory changes. Spleen: Normal in size without focal abnormality. Adrenals/Urinary Tract: The adrenal glands are unremarkable similar or slightly increased bilateral hydronephrosis and hydroureter. There is high attenuating content within the renal collecting systems bilaterally indicative of proteinaceous or hemorrhagic content. No stone. There is a moderate size left perinephric hematoma, new since the prior CT and measuring approximately 5.5 x 14 cm in greatest axial dimensions. Interval increase in the right  perinephric stranding. The urinary bladder is collapsed. Stomach/Bowel: Percutaneous gastrostomy with balloon in the body of the stomach. There is loose stool throughout the colon. There is no bowel obstruction. The appendix is normal. Vascular/Lymphatic: Mild aortoiliac atherosclerotic disease. The IVC is unremarkable. No portal venous gas. There is no adenopathy. Reproductive: The prostate is grossly unremarkable. Other: None Musculoskeletal: Osteopenia with degenerative changes of the spine. No acute osseous pathology. IMPRESSION: 1. Moderate size left perinephric hematoma, new since the prior CT. 2. Similar or slightly increased bilateral hydronephrosis and hydroureter. High attenuating content within the renal collecting systems bilaterally indicative of proteinaceous or hemorrhagic content. No stone. 3. Percutaneous gastrostomy with balloon in the body of the stomach. No bowel obstruction. Normal appendix. 4. Cholelithiasis. 5. Partially visualized small bilateral pleural  effusions. Large areas of consolidation involving the lower lobes bilaterally, progressed since the prior CT may represent atelectasis or infiltrate. 6.  Aortic Atherosclerosis (ICD10-I70.0). Electronically Signed   By: Vanetta Chou M.D.   On: 12/04/2023 10:42         Medications:    albumin  human 25 g (12/04/23 1630)   piperacillin -tazobactam (ZOSYN )  IV 2.25 g (12/05/23 0515)    sodium chloride    Intravenous Once   Chlorhexidine  Gluconate Cloth  6 each Topical Q0600   epoetin  alfa-epbx (RETACRIT ) injection  10,000 Units Intravenous Q M,W,F-HD   feeding supplement (NEPRO CARB STEADY)  237 mL Oral 5 X Daily   feeding supplement (PROSource TF20)  60 mL Per Tube Daily   ferrous sulfate   325 mg Per Tube Daily   fiber supplement (BANATROL TF)  60 mL Per Tube BID   free water   120 mL Per Tube 5 X Daily   gentamicin  ointment   Topical TID   heparin  injection (subcutaneous)  5,000 Units Subcutaneous Q8H   insulin  aspart   10 Units Subcutaneous Once   midodrine   5 mg Per Tube TID WC   multivitamin  1 tablet Per Tube QHS   pantoprazole  (PROTONIX ) IV  40 mg Intravenous Q12H   acetaminophen  **OR** acetaminophen , albumin  human, artificial tears, bisacodyl , ipratropium-albuterol , [DISCONTINUED] ondansetron  **OR** ondansetron  (ZOFRAN ) IV, mouth rinse  Assessment/ Plan:  Mr. Robert Vega is a 83 y.o.  male with obstructive sleep apnea, GERD, obesity, hypertension, dysphagia, severe esophageal dysmotility with achalasia status post PEG tube placement, recent aspiration pneumonia acute respiratory failure status post extubation 10/09/2023, who was admitted to [facility] on 10/05/2023 for Aspiration into respiratory tract, initial encounter [T17.908A] Severe sepsis (HCC) [A41.9, R65.20] History of dysphagia [Z87.898] Fever, unspecified fever cause [R50.9] Multifocal pneumonia [J18.9]   1.  Acute kidney injury. Requiring hemodialysis. With obstructive uropathy Baseline creatinine 0.81 from 10/14/2023.  Acute kidney injury secondary to ATN, obstructive uropathy, and progression of prerenal azotemia.   Patient is critically ill.  He was started on urgent hemodialysis for severe hyperkalemia and uremia. Vascular surgery placed PermCath on 6/26.  - TOC dialysis coordinator notified of need for outpatient dialysis clinic.  Search remains in progress. - Patient received dialysis yesterday, tolerated treatment seated in chair - Next treatment scheduled for Friday - Growing concern that patient may not be appropriate for outpatient dialysis clinic.  Will discuss with granddaughter.   2.  Sepsis and hypotension due to aspiration pneumonia, found to have distal esophageal obstruction with severe dysmotility.  He is status post botulinum toxin injection to the lower esophageal sphincter.  PEG tube placed this admission. Receiving continuous tube feeds with scheduled free water  flushes.  - Continue midodrine   4.Anemia in the setting of  renal failure Lab Results  Component Value Date   HGB 7.3 (L) 12/05/2023  - Continue IV EPO with HD treatments.  -Hemoglobin remains decreased.  5.  Bilateral hydronephrosis Noted on CT.Urology team has evaluated the patient and it is thought to be secondary to reflux.  Due to health, further intervention deferred at this time.     LOS: 59 Robert Vega 7/10/202512:52 PM

## 2023-12-05 NOTE — Consult Note (Signed)
 Urology Consult  I have been asked to see the patient by Dr. Trudy, for evaluation and management of perinephric hematoma.  Chief Complaint: Leukocytosis  History of Present Illness: Robert Vega is a 83 y.o. year old male with PMH BPH currently with a prolonged admission with the following notable events: -Admitted 10/05/2023 with sepsis due to aspiration pneumonia; developed acute respiratory failure with cardiac arrest requiring intubation and transferred to the ICU - 10/08/2023: PEG tube placed, underlying achalasia suspected -10/09/2023: Extubated - 10/22/2023: Dislodged PEG tube requiring ex lap, takedown of gastrocutaneous fistula, repair of gastrotomy, and placement of G-tube with Dr. Marinda - 10/23/2023: Peritonitis - 11/08/2023: Worsening renal function, nephrology consulted, thought secondary to ATN - 11/11/2023: HD started.  Hospitalist curbsided us  regarding CT findings of bilateral hydronephrosis.  This appeared rather stable compared to admission imaging, felt likely chronic.  No intervention offered. - 11/25/2023: New fever, concern for possible repeat aspiration pneumonia - 12/04/2023: Persistent leukocytosis, CTAP without contrast shows new moderate left perinephric hematoma with similar to slightly increased bilateral hydroureteronephrosis.  Urology consulted based on these findings.  Today, hemoglobin stable, 7.3; WBC count stable, 17.1; creatinine down, 4.47.  Last HD yesterday.  He is somnolent today, nonverbal, unable to contribute to HPI.  I spoke with his granddaughter, Robert Vega, via telephone this afternoon.  She shares that she is less concerned about [name] perinephric hematoma or bilateral hydroureteronephrosis, but is more concerned about his persistent leukocytosis/infection of unclear etiology.  She suspects he may have a UTI, but understands the testing for this has been a challenge since he is anuric.  She feels his renal function tends to improve when  he is better hydrated and that he is slowly improving on Zosyn .  Anti-infectives (From admission, onward)    Start     Dose/Rate Route Frequency Ordered Stop   12/03/23 2000  piperacillin -tazobactam (ZOSYN ) IVPB 2.25 g        2.25 g 100 mL/hr over 30 Minutes Intravenous Every 8 hours 12/03/23 1956 12/06/23 0559   11/28/23 2200  piperacillin -tazobactam (ZOSYN ) IVPB 2.25 g        2.25 g 100 mL/hr over 30 Minutes Intravenous Every 8 hours 11/28/23 1501 12/03/23 1940   11/27/23 1200  vancomycin  (VANCOCIN ) IVPB 1000 mg/200 mL premix  Status:  Discontinued        1,000 mg 200 mL/hr over 60 Minutes Intravenous Every M-W-F (Hemodialysis) 11/25/23 2231 11/28/23 1501   11/25/23 2315  vancomycin  (VANCOCIN ) IVPB 1000 mg/200 mL premix       Placed in Followed by Linked Group   1,000 mg 200 mL/hr over 60 Minutes Intravenous  Once 11/25/23 2216 11/26/23 0013   11/25/23 2315  vancomycin  (VANCOREADY) IVPB 1250 mg/250 mL       Placed in Followed by Linked Group   1,250 mg 166.7 mL/hr over 90 Minutes Intravenous  Once 11/25/23 2216 11/26/23 0350   11/25/23 2300  metroNIDAZOLE  (FLAGYL ) IVPB 500 mg  Status:  Discontinued        500 mg 100 mL/hr over 60 Minutes Intravenous Every 12 hours 11/25/23 2210 11/28/23 1501   11/25/23 2300  vancomycin  (VANCOCIN ) IVPB 1000 mg/200 mL premix  Status:  Discontinued        1,000 mg 200 mL/hr over 60 Minutes Intravenous  Once 11/25/23 2210 11/25/23 2215   11/25/23 2230  ceFEPIme  (MAXIPIME ) 1 g in sodium chloride  0.9 % 100 mL IVPB  Status:  Discontinued        1  g 200 mL/hr over 30 Minutes Intravenous Every 24 hours 11/25/23 2210 11/28/23 1501   11/21/23 1400  ceFAZolin  (ANCEF ) IVPB 1 g/50 mL premix  Status:  Discontinued        1 g 100 mL/hr over 30 Minutes Intravenous 30 min pre-op 11/21/23 1027 11/21/23 1123   11/11/23 1815  Ampicillin -Sulbactam (UNASYN ) 3 g in sodium chloride  0.9 % 100 mL IVPB  Status:  Discontinued        3 g 200 mL/hr over 30 Minutes  Intravenous Every 12 hours 11/11/23 1726 11/15/23 1431   11/10/23 1715  cefTRIAXone  (ROCEPHIN ) 1 g in sodium chloride  0.9 % 100 mL IVPB  Status:  Discontinued        1 g 200 mL/hr over 30 Minutes Intravenous Every 24 hours 11/10/23 1623 11/11/23 1725   10/21/23 2200  Ampicillin -Sulbactam (UNASYN ) 3 g in sodium chloride  0.9 % 100 mL IVPB  Status:  Discontinued        3 g 200 mL/hr over 30 Minutes Intravenous Every 6 hours 10/21/23 2011 10/21/23 2014   10/21/23 2200  piperacillin -tazobactam (ZOSYN ) IVPB 3.375 g        3.375 g 12.5 mL/hr over 240 Minutes Intravenous Every 8 hours 10/21/23 2019 10/26/23 2359   10/06/23 1600  cefTRIAXone  (ROCEPHIN ) 2 g in sodium chloride  0.9 % 100 mL IVPB        2 g 200 mL/hr over 30 Minutes Intravenous Every 24 hours 10/06/23 1415 10/09/23 1749   10/06/23 0600  piperacillin -tazobactam (ZOSYN ) IVPB 3.375 g  Status:  Discontinued        3.375 g 12.5 mL/hr over 240 Minutes Intravenous Every 8 hours 10/06/23 0356 10/06/23 1415   10/06/23 0500  cefTRIAXone  (ROCEPHIN ) 2 g in sodium chloride  0.9 % 100 mL IVPB  Status:  Discontinued        2 g 200 mL/hr over 30 Minutes Intravenous Every 24 hours 10/05/23 0750 10/06/23 0341   10/06/23 0500  azithromycin  (ZITHROMAX ) 500 mg in sodium chloride  0.9 % 250 mL IVPB  Status:  Discontinued        500 mg 250 mL/hr over 60 Minutes Intravenous Every 24 hours 10/05/23 0750 10/06/23 1409   10/05/23 0615  metroNIDAZOLE  (FLAGYL ) IVPB 500 mg        500 mg 100 mL/hr over 60 Minutes Intravenous  Once 10/05/23 0611 10/05/23 0910   10/05/23 0545  cefTRIAXone  (ROCEPHIN ) 2 g in sodium chloride  0.9 % 100 mL IVPB        2 g 200 mL/hr over 30 Minutes Intravenous Once 10/05/23 0533 10/05/23 0650   10/05/23 0545  azithromycin  (ZITHROMAX ) 500 mg in sodium chloride  0.9 % 250 mL IVPB        500 mg 250 mL/hr over 60 Minutes Intravenous  Once 10/05/23 0533 10/05/23 0805        Past Medical History:  Diagnosis Date   Allergy    Arthritis     BPH (benign prostatic hypertrophy)    Colonic polyp    GERD (gastroesophageal reflux disease)    Has LPR   Hypertension     Past Surgical History:  Procedure Laterality Date   CATARACT EXTRACTION     2011, other in 2013   COLONOSCOPY  2011   CREATION, GASTROSTOMY, OPEN N/A 10/22/2023   Procedure: CREATION, GASTROSTOMY, OPEN; GASTROSTOMY CLOSURE;  Surgeon: Marinda Jayson KIDD, MD;  Location: [facility] ORS;  Service: General;  Laterality: N/A;   DIALYSIS/PERMA CATHETER INSERTION N/A 11/21/2023   Procedure: DIALYSIS/PERMA CATHETER  INSERTION;  Surgeon: Marea Selinda RAMAN, MD;  Location: [facility]IVE [facility];  Service: Cardiovascular;  Laterality: N/A;   ESOPHAGOGASTRODUODENOSCOPY N/A 10/15/2023   Procedure: EGD (ESOPHAGOGASTRODUODENOSCOPY);  Surgeon: Jinny Carmine, MD;  Location: [facility] ENDOSCOPY;  Service: Endoscopy;  Laterality: N/A;  WILL NEED BOTOX    ESOPHAGOGASTRODUODENOSCOPY N/A 11/05/2023   Procedure: EGD (ESOPHAGOGASTRODUODENOSCOPY);  Surgeon: Jinny Carmine, MD;  Location: [facility] ENDOSCOPY;  Service: Endoscopy;  Laterality: N/A;   ESOPHAGOGASTRODUODENOSCOPY (EGD) WITH PROPOFOL  N/A 08/30/2020   Procedure: ESOPHAGOGASTRODUODENOSCOPY (EGD) WITH PROPOFOL ;  Surgeon: Therisa Bi, MD;  Location: [facility] ENDOSCOPY;  Service: Gastroenterology;  Laterality: N/A;   ESOPHAGOGASTRODUODENOSCOPY (EGD) WITH PROPOFOL  N/A 04/02/2022   Procedure: ESOPHAGOGASTRODUODENOSCOPY (EGD) WITH PROPOFOL ;  Surgeon: Therisa Bi, MD;  Location: [facility] ENDOSCOPY;  Service: Gastroenterology;  Laterality: N/A;   LAPAROTOMY N/A 10/22/2023   Procedure: LAPAROTOMY, EXPLORATORY;  Surgeon: Marinda Jayson KIDD, MD;  Location: [facility] ORS;  Service: General;  Laterality: N/A;   PEG PLACEMENT N/A 10/08/2023   Procedure: INSERTION, PEG TUBE;  Surgeon: Jinny Carmine, MD;  Location: [facility] ENDOSCOPY;  Service: Endoscopy;  Laterality: N/A;   skin cancer removal     TEMPORARY DIALYSIS CATHETER N/A 11/11/2023   Procedure: TEMPORARY DIALYSIS CATHETER;  Surgeon: Marea Selinda RAMAN,  MD;  Location: [facility] CV LAB;  Service: Cardiovascular;  Laterality: N/A;    Home Medications:  Current Meds  Medication Sig   amLODipine  (NORVASC ) 2.5 MG tablet Take 1 tablet by mouth daily.   fluticasone (FLONASE) 50 MCG/ACT nasal spray Place 2 sprays into both nostrils daily.    Allergies:  Allergies  Allergen Reactions   Sulfonamide Derivatives     Family History  Problem Relation Age of Onset   Hypertension Mother    Heart disease Neg Hx    Diabetes Neg Hx    Cancer Neg Hx     Social History:  reports that he quit smoking about 9 years ago. His smoking use included pipe and cigarettes. He started smoking about 59 years ago. He has a 100 pack-year smoking history. He has been exposed to tobacco smoke. He has never used smokeless tobacco. He reports that he does not drink alcohol  and does not use drugs.  ROS: A complete review of systems was performed.  All systems are negative except for pertinent findings as noted.  Physical Exam:  Vital signs in last 24 hours: Temp:  [97 F (36.1 C)-98.9 F (37.2 C)] 97.8 F (36.6 C) (07/10 0718) Pulse Rate:  [59-83] 69 (07/10 0718) Resp:  [16-26] 16 (07/10 0718) BP: (102-140)/(53-108) 112/60 (07/10 0718) SpO2:  [95 %-100 %] 98 % (07/10 0718) Constitutional: Somnolent, frail-appearing, no acute distress HEENT: [place] AT, moist mucus membranes Cardiovascular: No clubbing, cyanosis, or edema Respiratory: Normal respiratory effort Skin: No rashes, bruises or suspicious lesions Neurologic: Grossly intact  Laboratory Data:  Recent Labs    12/03/23 0959 12/05/23 0522  WBC 18.8* 17.1*  HGB 7.6* 7.3*  HCT 24.6* 23.6*   Recent Labs    12/03/23 0959 12/05/23 0522  NA 136 132*  K 3.7 3.4*  CL 97* 93*  CO2 26 26  GLUCOSE 117* 124*  BUN 65* 44*  CREATININE 5.21* 4.47*  CALCIUM  7.8* 8.0*   Urinalysis    Component Value Date/Time   COLORURINE YELLOW (A) 11/10/2023 0158   APPEARANCEUR HAZY (A) 11/10/2023 0158    LABSPEC 1.010 11/10/2023 0158   PHURINE 6.0 11/10/2023 0158   GLUCOSEU NEGATIVE 11/10/2023 0158   HGBUR NEGATIVE 11/10/2023 0158   BILIRUBINUR NEGATIVE 11/10/2023  0158   KETONESUR NEGATIVE 11/10/2023 0158   PROTEINUR 100 (A) 11/10/2023 0158   NITRITE NEGATIVE 11/10/2023 0158   LEUKOCYTESUR TRACE (A) 11/10/2023 0158   Results for orders placed or performed during the hospital encounter of 10/05/23  Blood Culture (routine x 2)     Status: None   Collection Time: 10/05/23  5:06 AM   Specimen: BLOOD  Result Value Ref Range Status   Specimen Description BLOOD LA  Final   Special Requests   Final    BOTTLES DRAWN AEROBIC AND ANAEROBIC Blood Culture results may not be optimal due to an inadequate volume of blood received in culture bottles   Culture   Final    NO GROWTH 5 DAYS Performed at [facility], [address] [place], [place] [address]    Report Status 10/10/2023 FINAL  Final  Blood Culture (routine x 2)     Status: None   Collection Time: 10/05/23  5:07 AM   Specimen: BLOOD  Result Value Ref Range Status   Specimen Description BLOOD RA  Final   Special Requests   Final    BOTTLES DRAWN AEROBIC AND ANAEROBIC Blood Culture results may not be optimal due to an inadequate volume of blood received in culture bottles   Culture   Final    NO GROWTH 5 DAYS Performed at [facility], [address] [place], [place] [address]    Report Status 10/10/2023 FINAL  Final  Resp panel by RT-PCR (RSV, Flu A&B, Covid) Anterior Nasal Swab     Status: None   Collection Time: 10/05/23  5:56 AM   Specimen: Anterior Nasal Swab  Result Value Ref Range Status   SARS Coronavirus 2 by RT PCR NEGATIVE NEGATIVE Final    Comment: (NOTE) SARS-CoV-2 target nucleic acids are NOT DETECTED.  The SARS-CoV-2 RNA is generally detectable in upper respiratory specimens during the acute phase of infection. The lowest concentration of SARS-CoV-2 viral copies this assay can detect is 138  copies/mL. A negative result does not preclude SARS-Cov-2 infection and should not be used as the sole basis for treatment or other patient management decisions. A negative result may occur with  improper specimen collection/handling, submission of specimen other than nasopharyngeal swab, presence of viral mutation(s) within the areas targeted by this assay, and inadequate number of viral copies(<138 copies/mL). A negative result must be combined with clinical observations, patient history, and epidemiological information. The expected result is Negative.  Fact Sheet for Patients:  BloggerCourse.com  Fact Sheet for Healthcare Providers:  SeriousBroker.it  This test is no t yet approved or cleared by the [place]  FDA and  has been authorized for detection and/or diagnosis of SARS-CoV-2 by FDA under an Emergency Use Authorization (EUA). This EUA will remain  in effect (meaning this test can be used) for the duration of the COVID-19 declaration under Section 564(b)(1) of the Act, 21 U.S.C.section 360bbb-3(b)(1), unless the authorization is terminated  or revoked sooner.       Influenza A by PCR NEGATIVE NEGATIVE Final   Influenza B by PCR NEGATIVE NEGATIVE Final    Comment: (NOTE) The Xpert Xpress SARS-CoV-2/FLU/RSV plus assay is intended as an aid in the diagnosis of influenza from Nasopharyngeal swab specimens and should not be used as a sole basis for treatment. Nasal washings and aspirates are unacceptable for Xpert Xpress SARS-CoV-2/FLU/RSV testing.  Fact Sheet for Patients: BloggerCourse.com  Fact Sheet for Healthcare Providers: SeriousBroker.it  This test is not yet approved or cleared  by the [place]  FDA and has been authorized for detection and/or diagnosis of SARS-CoV-2 by FDA under an Emergency Use Authorization (EUA). This EUA will remain in effect (meaning  this test can be used) for the duration of the COVID-19 declaration under Section 564(b)(1) of the Act, 21 U.S.C. section 360bbb-3(b)(1), unless the authorization is terminated or revoked.     Resp Syncytial Virus by PCR NEGATIVE NEGATIVE Final    Comment: (NOTE) Fact Sheet for Patients: BloggerCourse.com  Fact Sheet for Healthcare Providers: SeriousBroker.it  This test is not yet approved or cleared by the [place]  FDA and has been authorized for detection and/or diagnosis of SARS-CoV-2 by FDA under an Emergency Use Authorization (EUA). This EUA will remain in effect (meaning this test can be used) for the duration of the COVID-19 declaration under Section 564(b)(1) of the Act, 21 U.S.C. section 360bbb-3(b)(1), unless the authorization is terminated or revoked.  Performed at [facility], [address] [place], [place] [address]   Respiratory (~20 pathogens) panel by PCR     Status: None   Collection Time: 10/05/23  8:11 AM   Specimen: Nasopharyngeal Swab; Respiratory  Result Value Ref Range Status   Adenovirus NOT DETECTED NOT DETECTED Final   Coronavirus 229E NOT DETECTED NOT DETECTED Final    Comment: (NOTE) The Coronavirus on the Respiratory Panel, DOES NOT test for the novel  Coronavirus (2019 nCoV)    Coronavirus HKU1 NOT DETECTED NOT DETECTED Final   Coronavirus NL63 NOT DETECTED NOT DETECTED Final   Coronavirus OC43 NOT DETECTED NOT DETECTED Final   Metapneumovirus NOT DETECTED NOT DETECTED Final   Rhinovirus / Enterovirus NOT DETECTED NOT DETECTED Final   Influenza A NOT DETECTED NOT DETECTED Final   Influenza B NOT DETECTED NOT DETECTED Final   Parainfluenza Virus 1 NOT DETECTED NOT DETECTED Final   Parainfluenza Virus 2 NOT DETECTED NOT DETECTED Final   Parainfluenza Virus 3 NOT DETECTED NOT DETECTED Final   Parainfluenza Virus 4 NOT DETECTED NOT DETECTED Final   Respiratory Syncytial Virus NOT  DETECTED NOT DETECTED Final   Bordetella pertussis NOT DETECTED NOT DETECTED Final   Bordetella Parapertussis NOT DETECTED NOT DETECTED Final   Chlamydophila pneumoniae NOT DETECTED NOT DETECTED Final   Mycoplasma pneumoniae NOT DETECTED NOT DETECTED Final    Comment: Performed at [facility], [address], [place], [place] [address]  Expectorated Sputum Assessment w Gram Stain, Rflx to Resp Cult     Status: None   Collection Time: 10/05/23  9:07 AM   Specimen: Sputum  Result Value Ref Range Status   Specimen Description SPUTUM  Final   Special Requests NONE  Final   Sputum evaluation   Final    Sputum specimen not acceptable for testing.  Please recollect.   C/KERRY NELSON AT 1005 10/05/23.PMF Performed at [facility], [address] [place], [place] [address]    Report Status 10/05/2023 FINAL  Final  Expectorated Sputum Assessment w Gram Stain, Rflx to Resp Cult     Status: None   Collection Time: 10/05/23 10:50 AM  Result Value Ref Range Status   Specimen Description EXPECTORATED SPUTUM  Final   Special Requests NONE  Final   Sputum evaluation   Final    THIS SPECIMEN IS ACCEPTABLE FOR SPUTUM CULTURE Performed at [facility], [address] [place], [place] [address]    Report Status 10/05/2023 FINAL  Final  Culture, Respiratory w Gram Stain     Status:  None   Collection Time: 10/05/23 10:50 AM  Result Value Ref Range Status   Specimen Description   Final    EXPECTORATED SPUTUM Performed at [facility], [address] [place], [place] [address]    Special Requests   Final    NONE Reflexed from [id number] Performed at [facility], [address] [place], [place] [address]    Gram Stain   Final    RARE WBC SEEN RARE VONNE POSITIVE RODS RARE VONNE POSITIVE COCCI RARE GRAM NEGATIVE RODS    Culture   Final    FEW Normal respiratory flora-no Staph aureus or Pseudomonas seen Performed at [facility], [address] [place], [place] [address]    Report Status 10/07/2023 FINAL  Final  MRSA Next Gen by PCR, Nasal     Status: None   Collection Time: 10/06/23 12:57 AM   Specimen: Nasal Mucosa; Nasal Swab  Result Value Ref Range Status   MRSA by PCR Next Gen NOT DETECTED NOT DETECTED Final    Comment: (NOTE) The GeneXpert MRSA Assay (FDA approved for NASAL specimens only), is one component of a comprehensive MRSA colonization surveillance program. It is not intended to diagnose MRSA infection nor to guide or monitor treatment for MRSA infections. Test performance is not FDA approved in patients less than 28 years old. Performed at [facility], [address] [place], [place] [address]   Culture, Respiratory w Gram Stain     Status: None   Collection Time: 10/06/23 11:37 AM   Specimen: INDUCED SPUTUM  Result Value Ref Range Status   Specimen Description   Final    INDUCED SPUTUM Performed at [facility], [address], [place], [place] [address]    Special Requests   Final    NONE Performed at [facility], [address] [place], [place] [address]    Gram Stain   Final    FEW WBC PRESENT,BOTH PMN AND MONONUCLEAR FEW GRAM POSITIVE RODS    Culture   Final    MODERATE LACTOBACILLUS FERMENTUM Standardized susceptibility testing for this organism is not available. Performed at [facility], [address], [place], [place] [address]    Report Status 10/09/2023 FINAL  Final  Culture, blood (x 2)     Status: None   Collection Time: 10/22/23  1:00 AM   Specimen: BLOOD  Result Value Ref Range Status   Specimen Description BLOOD BLOOD RIGHT ARM  Final   Special Requests   Final    BOTTLES DRAWN AEROBIC AND ANAEROBIC Blood Culture adequate volume   Culture   Final    NO GROWTH 5 DAYS Performed at [facility], [address], [place], [place] [address]    Report Status 10/27/2023 FINAL  Final  Culture, blood (x 2)     Status: None   Collection  Time: 10/22/23  1:00 AM   Specimen: BLOOD  Result Value Ref Range Status   Specimen Description BLOOD BLOOD RIGHT ARM  Final   Special Requests   Final    BOTTLES DRAWN AEROBIC AND ANAEROBIC Blood Culture adequate volume   Culture   Final    NO GROWTH 5 DAYS Performed at [facility], [address] [place], [place] [address]    Report Status 10/27/2023 FINAL  Final  Resp panel by RT-PCR (RSV, Flu A&B, Covid) Anterior Nasal Swab     Status: None   Collection Time: 11/09/23  5:31 PM  Specimen: Anterior Nasal Swab  Result Value Ref Range Status   SARS Coronavirus 2 by RT PCR NEGATIVE NEGATIVE Final    Comment: (NOTE) SARS-CoV-2 target nucleic acids are NOT DETECTED.  The SARS-CoV-2 RNA is generally detectable in upper respiratory specimens during the acute phase of infection. The lowest concentration of SARS-CoV-2 viral copies this assay can detect is 138 copies/mL. A negative result does not preclude SARS-Cov-2 infection and should not be used as the sole basis for treatment or other patient management decisions. A negative result may occur with  improper specimen collection/handling, submission of specimen other than nasopharyngeal swab, presence of viral mutation(s) within the areas targeted by this assay, and inadequate number of viral copies(<138 copies/mL). A negative result must be combined with clinical observations, patient history, and epidemiological information. The expected result is Negative.  Fact Sheet for Patients:  BloggerCourse.com  Fact Sheet for Healthcare Providers:  SeriousBroker.it  This test is no t yet approved or cleared by the [place]  FDA and  has been authorized for detection and/or diagnosis of SARS-CoV-2 by FDA under an Emergency Use Authorization (EUA). This EUA will remain  in effect (meaning this test can be used) for the duration of the COVID-19 declaration under Section  564(b)(1) of the Act, 21 U.S.C.section 360bbb-3(b)(1), unless the authorization is terminated  or revoked sooner.       Influenza A by PCR NEGATIVE NEGATIVE Final   Influenza B by PCR NEGATIVE NEGATIVE Final    Comment: (NOTE) The Xpert Xpress SARS-CoV-2/FLU/RSV plus assay is intended as an aid in the diagnosis of influenza from Nasopharyngeal swab specimens and should not be used as a sole basis for treatment. Nasal washings and aspirates are unacceptable for Xpert Xpress SARS-CoV-2/FLU/RSV testing.  Fact Sheet for Patients: BloggerCourse.com  Fact Sheet for Healthcare Providers: SeriousBroker.it  This test is not yet approved or cleared by the [place]  FDA and has been authorized for detection and/or diagnosis of SARS-CoV-2 by FDA under an Emergency Use Authorization (EUA). This EUA will remain in effect (meaning this test can be used) for the duration of the COVID-19 declaration under Section 564(b)(1) of the Act, 21 U.S.C. section 360bbb-3(b)(1), unless the authorization is terminated or revoked.     Resp Syncytial Virus by PCR NEGATIVE NEGATIVE Final    Comment: (NOTE) Fact Sheet for Patients: BloggerCourse.com  Fact Sheet for Healthcare Providers: SeriousBroker.it  This test is not yet approved or cleared by the [place]  FDA and has been authorized for detection and/or diagnosis of SARS-CoV-2 by FDA under an Emergency Use Authorization (EUA). This EUA will remain in effect (meaning this test can be used) for the duration of the COVID-19 declaration under Section 564(b)(1) of the Act, 21 U.S.C. section 360bbb-3(b)(1), unless the authorization is terminated or revoked.  Performed at [facility], [address] [place], [place] [address]   Respiratory (~20 pathogens) panel by PCR     Status: None   Collection Time: 11/09/23  5:31 PM   Specimen:  Nasopharyngeal Swab; Respiratory  Result Value Ref Range Status   Adenovirus NOT DETECTED NOT DETECTED Final   Coronavirus 229E NOT DETECTED NOT DETECTED Final    Comment: (NOTE) The Coronavirus on the Respiratory Panel, DOES NOT test for the novel  Coronavirus (2019 nCoV)    Coronavirus HKU1 NOT DETECTED NOT DETECTED Final   Coronavirus NL63 NOT DETECTED NOT DETECTED Final   Coronavirus OC43 NOT DETECTED NOT DETECTED Final   Metapneumovirus NOT DETECTED NOT DETECTED Final  Rhinovirus / Enterovirus NOT DETECTED NOT DETECTED Final   Influenza A NOT DETECTED NOT DETECTED Final   Influenza B NOT DETECTED NOT DETECTED Final   Parainfluenza Virus 1 NOT DETECTED NOT DETECTED Final   Parainfluenza Virus 2 NOT DETECTED NOT DETECTED Final   Parainfluenza Virus 3 NOT DETECTED NOT DETECTED Final   Parainfluenza Virus 4 NOT DETECTED NOT DETECTED Final   Respiratory Syncytial Virus NOT DETECTED NOT DETECTED Final   Bordetella pertussis NOT DETECTED NOT DETECTED Final   Bordetella Parapertussis NOT DETECTED NOT DETECTED Final   Chlamydophila pneumoniae NOT DETECTED NOT DETECTED Final   Mycoplasma pneumoniae NOT DETECTED NOT DETECTED Final    Comment: Performed at [facility], [address] [place], [place] [address]  Culture, blood (Routine X 2) w Reflex to ID Panel     Status: None   Collection Time: 11/09/23  6:16 PM   Specimen: BLOOD  Result Value Ref Range Status   Specimen Description   Final    BLOOD RIGHT ANTECUBITAL Performed at [facility], [address] [place], [place] [address]    Special Requests   Final    BOTTLES DRAWN AEROBIC ONLY Blood Culture adequate volume Performed at [facility], [address], [place], [place] [address]    Culture   Final    NO GROWTH 11 DAYS Performed at [facility], [address] [place], [place] [address]    Report Status 11/20/2023 FINAL  Final  Culture, blood (Routine X 2) w Reflex to ID Panel      Status: None   Collection Time: 11/09/23  6:23 PM   Specimen: BLOOD  Result Value Ref Range Status   Specimen Description   Final    BLOOD BLOOD LEFT FOREARM Performed at [facility], [address] [place], [place] [address]    Special Requests   Final    BOTTLES DRAWN AEROBIC AND ANAEROBIC Blood Culture adequate volume Performed at [facility], [address] [place], [place] [address]    Culture   Final    NO GROWTH 11 DAYS Performed at [facility], [address], [place], [place] [address]    Report Status 11/20/2023 FINAL  Final  Urine Culture     Status: Abnormal   Collection Time: 11/10/23  1:58 AM   Specimen: Urine, Random  Result Value Ref Range Status   Specimen Description   Final    URINE, RANDOM Performed at [facility], [address] [place], [place] [address]    Special Requests   Final    NONE Reflexed from [id number] Performed at [facility], [address] [place], [place] [address]    Culture 60,000 COLONIES/mL ENTEROCOCCUS FAECALIS (A)  Final   Report Status 11/12/2023 FINAL  Final   Organism ID, Bacteria ENTEROCOCCUS FAECALIS (A)  Final      Susceptibility   Enterococcus faecalis - MIC*    AMPICILLIN  <=2 SENSITIVE Sensitive     NITROFURANTOIN <=16 SENSITIVE Sensitive     VANCOMYCIN  1 SENSITIVE Sensitive     * 60,000 COLONIES/mL ENTEROCOCCUS FAECALIS  Culture, blood (x 2)     Status: None   Collection Time: 11/25/23 10:31 PM   Specimen: BLOOD  Result Value Ref Range Status   Specimen Description BLOOD BLOOD LEFT ARM  Final   Special Requests   Final    BOTTLES DRAWN AEROBIC AND ANAEROBIC Blood Culture adequate volume   Culture  Final    NO GROWTH 5 DAYS Performed at [facility], [address] [place], [place] [address]    Report Status 11/30/2023 FINAL  Final  Culture, blood (x 2)     Status: None   Collection Time: 11/25/23 10:31 PM   Specimen: BLOOD  Result Value Ref Range  Status   Specimen Description BLOOD BLOOD RIGHT ARM  Final   Special Requests   Final    BOTTLES DRAWN AEROBIC AND ANAEROBIC Blood Culture adequate volume   Culture   Final    NO GROWTH 5 DAYS Performed at [facility], [address] [place], [place] [address]    Report Status 11/30/2023 FINAL  Final  Gastrointestinal Panel by PCR , Stool     Status: None   Collection Time: 11/30/23  5:10 PM   Specimen: Stool  Result Value Ref Range Status   Campylobacter species NOT DETECTED NOT DETECTED Final   Plesimonas shigelloides NOT DETECTED NOT DETECTED Final   Salmonella species NOT DETECTED NOT DETECTED Final   Yersinia enterocolitica NOT DETECTED NOT DETECTED Final   Vibrio species NOT DETECTED NOT DETECTED Final   Vibrio cholerae NOT DETECTED NOT DETECTED Final   Enteroaggregative E coli (EAEC) NOT DETECTED NOT DETECTED Final   Enteropathogenic E coli (EPEC) NOT DETECTED NOT DETECTED Final   Enterotoxigenic E coli (ETEC) NOT DETECTED NOT DETECTED Final   Shiga like toxin producing E coli (STEC) NOT DETECTED NOT DETECTED Final   Shigella/Enteroinvasive E coli (EIEC) NOT DETECTED NOT DETECTED Final   Cryptosporidium NOT DETECTED NOT DETECTED Final   Cyclospora cayetanensis NOT DETECTED NOT DETECTED Final   Entamoeba histolytica NOT DETECTED NOT DETECTED Final   Giardia lamblia NOT DETECTED NOT DETECTED Final   Adenovirus F40/41 NOT DETECTED NOT DETECTED Final   Astrovirus NOT DETECTED NOT DETECTED Final   Norovirus GI/GII NOT DETECTED NOT DETECTED Final   Rotavirus A NOT DETECTED NOT DETECTED Final   Sapovirus (I, II, IV, and V) NOT DETECTED NOT DETECTED Final    Comment: Performed at [facility], [address], [place], [place] [address]    Radiologic Imaging: CT ABDOMEN PELVIS WO CONTRAST Result Date: 12/04/2023 CLINICAL DATA:  Persistent leukocytosis. Infection of uncertain etiology. EXAM: CT ABDOMEN AND PELVIS WITHOUT CONTRAST TECHNIQUE: Multidetector CT  imaging of the abdomen and pelvis was performed following the standard protocol without IV contrast. RADIATION DOSE REDUCTION: This exam was performed according to the departmental dose-optimization program which includes automated exposure control, adjustment of the mA and/or kV according to patient size and/or use of iterative reconstruction technique. COMPARISON:  CT abdomen pelvis dated 11/10/2023. FINDINGS: Evaluation of this exam is limited in the absence of intravenous contrast as well as due to respiratory motion. Lower chest: Partially visualized small bilateral pleural effusions. Large areas of consolidation involving the lower lobes bilaterally, progressed since the prior CT may represent atelectasis or infiltrate. There is 3 vessel coronary vascular calcification. No intra-abdominal free air. Hepatobiliary: The liver is unremarkable. No biliary dilatation. The gallbladder is contracted. Several gallstones. Pancreas: Unremarkable. No pancreatic ductal dilatation or surrounding inflammatory changes. Spleen: Normal in size without focal abnormality. Adrenals/Urinary Tract: The adrenal glands are unremarkable similar or slightly increased bilateral hydronephrosis and hydroureter. There is high attenuating content within the renal collecting systems bilaterally indicative of proteinaceous or hemorrhagic content. No stone. There is a moderate size left perinephric hematoma, new since the prior CT and measuring approximately 5.5 x 14 cm in greatest axial dimensions. Interval  increase in the right perinephric stranding. The urinary bladder is collapsed. Stomach/Bowel: Percutaneous gastrostomy with balloon in the body of the stomach. There is loose stool throughout the colon. There is no bowel obstruction. The appendix is normal. Vascular/Lymphatic: Mild aortoiliac atherosclerotic disease. The IVC is unremarkable. No portal venous gas. There is no adenopathy. Reproductive: The prostate is grossly unremarkable.  Other: None Musculoskeletal: Osteopenia with degenerative changes of the spine. No acute osseous pathology. IMPRESSION: 1. Moderate size left perinephric hematoma, new since the prior CT. 2. Similar or slightly increased bilateral hydronephrosis and hydroureter. High attenuating content within the renal collecting systems bilaterally indicative of proteinaceous or hemorrhagic content. No stone. 3. Percutaneous gastrostomy with balloon in the body of the stomach. No bowel obstruction. Normal appendix. 4. Cholelithiasis. 5. Partially visualized small bilateral pleural effusions. Large areas of consolidation involving the lower lobes bilaterally, progressed since the prior CT may represent atelectasis or infiltrate. 6.  Aortic Atherosclerosis (ICD10-I70.0). Electronically Signed   By: Vanetta Chou M.D.   On: 12/04/2023 10:42   Assessment & Plan:  83 y.o. male with a prolonged, complex admission featuring cardiac arrest, intubation, AKI due to ATN now on HD, with imaging findings of chronic to slightly progressive bilateral hydroureteronephrosis and a new left perinephric hematoma and now with persistent leukocytosis/infection of unclear etiology.  He had some degree of bilateral hydroureteronephrosis on admission imaging, but his renal function did not start to decline until 1 month later.  There has been minimal progression of this during his admission.  For this reason, I think it is unlikely that his hydroureteronephrosis is contributing significantly to his AKI.  Unclear etiology of perinephric hematoma, possibly 2/2 heparin , though his BP and blood counts remain stable. Ok to continue heparin , no indication for intervention at this time.  At this point, the only intervention I have to offer from the urologic perspective would be bilateral ureteral stent placement for maximum urinary decompression, with possible benefits including maximal drainage of any infected urine from the upper tracts or  resolving postrenal sources of AKI.  I was very honest with [name] that I cannot guarantee these benefits, and that stent placement may not benefit him and may only cause him stent pain. She understands this and would like to defer stent placement at this time.  Using shared decision making, [name] would like to focus on hydration and Zosyn  for now and see how he does over the weekend. We will not plan for stent placement at this time. We will plan to revisit this on Monday, or sooner if there are significant changes in his clinical status.  Recommendations: - Continue heparin , Zosyn , and hydration per primary, ID, and nutrition teams - Trend CBC, BMP, vitals - NPO at midnight Sunday night (0001 Monday morning) pending repeat evaluation and consideration of bilateral ureteral stents  Thank you for involving me in this patient's care, I will continue to follow along.  Hodari Chuba, PA-C 12/05/2023 12:47 PM

## 2023-12-05 NOTE — Progress Notes (Signed)
 Date of Admission:  10/05/2023      ID: Robert Vega is a 83 y.o. male Principal Problem:   Severe sepsis (HCC) Active Problems:   Encounter for dialysis and dialysis catheter care (HCC)   GERD (gastroesophageal reflux disease)   Dysphagia   Essential hypertension, benign   Multifocal pneumonia   Acute respiratory failure with hypoxia and hypercapnia (HCC)   History of dysphagia   Aspiration pneumonia (HCC)   Achalasia of esophagus   Acute delirium   Obesity (BMI 30-39.9)   Generalized weakness   Gastrostomy complication (HCC)   AKI (acute kidney injury) (HCC)   Generalized abdominal pain   Malnutrition of moderate degree   Melena   Leukocytosis   Perinephric hematoma   5/10 admission for weakness, hypoxia- aspiration pneumonia due to achlasia  5/10 CT b/l infitrates- intubated- extubated 5/14 Dilated esophagus 5/13 PEG instertion by GI 5/20 repeat EGD botulinum injection 5/25 pulled PEG out 5/27 exp lap for dislodged peg in the abdominal cavity 6/23melena 6/10 EGD 6/16 HD cath- dialysis started 6/26 permanent HD cath  Antibiotic therapy Ceftriaxone  5/10-5/14 Azithro 5/10-5/11 Metronidazole  5/10 Zosyn  5/26-5/31 Cefepime  6/30-7/3 Zosyn  7/3 >>    Subjective: sleeping   Medications:   sodium chloride    Intravenous Once   Chlorhexidine  Gluconate Cloth  6 each Topical Q0600   epoetin  alfa-epbx (RETACRIT ) injection  10,000 Units Intravenous Q M,W,F-HD   feeding supplement (NEPRO CARB STEADY)  237 mL Oral 5 X Daily   feeding supplement (PROSource TF20)  60 mL Per Tube Daily   ferrous sulfate   325 mg Per Tube Daily   fiber supplement (BANATROL TF)  60 mL Per Tube BID   free water   120 mL Per Tube 5 X Daily   gentamicin  ointment   Topical TID   heparin  injection (subcutaneous)  5,000 Units Subcutaneous Q8H   insulin  aspart  10 Units Subcutaneous Once   midodrine   5 mg Per Tube TID WC   multivitamin  1 tablet Per Tube QHS   pantoprazole  (PROTONIX ) IV  40  mg Intravenous Q12H    Objective: Vital signs in last 24 hours: Patient Vitals for the past 24 hrs:  BP Temp Temp src Pulse Resp SpO2  12/05/23 0718 112/60 97.8 F (36.6 C) Oral 69 16 98 %  12/05/23 0459 (!) 114/54 98.9 F (37.2 C) Oral 78 18 98 %  12/04/23 2019 (!) 126/57 97.8 F (36.6 C) Oral 70 18 99 %  12/04/23 1913 (!) 117/54 (!) (P) 97 F (36.1 C) (P) Axillary 83 18 100 %  12/04/23 1900 (!) 117/54 -- -- 75 18 100 %  12/04/23 1830 (!) 140/108 -- -- 71 (!) 26 99 %  12/04/23 1800 (!) 113/56 -- -- 66 19 96 %  12/04/23 1730 131/76 -- -- (!) 59 (!) 22 95 %  12/04/23 1700 124/63 -- -- 65 18 97 %  12/04/23 1630 130/64 -- -- 63 20 96 %  12/04/23 1619 -- -- -- (P) 66 (P) 19 (P) 96 %  12/04/23 1610 (!) 117/55 -- -- 66 (!) 22 100 %  12/04/23 1556 (!) 126/53 (!) 97.3 F (36.3 C) Axillary 73 (!) 21 100 %  12/04/23 1417 102/63 (!) 97.5 F (36.4 C) Oral 69 16 100 %     LDA HD cath PHYSICAL EXAM:  General: somnolent, does not repsond to questions,  Neck: symmetrical, no adenopathy, thyroid : non tender no carotid bruit and no JVD. Lungs: b/l air entry Crepts bases Heart: Regular  rate and rhythm, no murmur, rub or gallop. Abdomen: Soft, distended. Bowel sounds normal. No masses Extremities: atraumatic, no cyanosis. No edema. No clubbing Skin: No rashes or lesions. Or bruising CNS unable to assess Lab Results    Latest Ref Rng & Units 12/05/2023    5:22 AM 12/03/2023    9:59 AM 12/02/2023   10:40 AM  CBC  WBC 4.0 - 10.5 K/uL 17.1  18.8  18.4   Hemoglobin 13.0 - 17.0 g/dL 7.3  7.6  7.6   Hematocrit 39.0 - 52.0 % 23.6  24.6  24.8   Platelets 150 - 400 K/uL 451  468  513        Latest Ref Rng & Units 12/05/2023    5:22 AM 12/03/2023    9:59 AM 12/02/2023   10:40 AM  CMP  Glucose 70 - 99 mg/dL 875  882  859   BUN 8 - 23 mg/dL 44  65  96   Creatinine 0.61 - 1.24 mg/dL 5.52  4.78  2.43   Sodium 135 - 145 mmol/L 132  136  138   Potassium 3.5 - 5.1 mmol/L 3.4  3.7  3.6   Chloride 98  - 111 mmol/L 93  97  98   CO2 22 - 32 mmol/L 26  26  22    Calcium  8.9 - 10.3 mg/dL 8.0  7.8  7.9       Microbiology: 5/10 BC NG 5/27 BC NG 6/14 BC- NG 6/30 BC NG Studies/Results:   CT ABDOMEN PELVIS WO CONTRAST Result Date: 12/04/2023 CLINICAL DATA:  Persistent leukocytosis. Infection of uncertain etiology. EXAM: CT ABDOMEN AND PELVIS WITHOUT CONTRAST TECHNIQUE: Multidetector CT imaging of the abdomen and pelvis was performed following the standard protocol without IV contrast. RADIATION DOSE REDUCTION: This exam was performed according to the departmental dose-optimization program which includes automated exposure control, adjustment of the mA and/or kV according to patient size and/or use of iterative reconstruction technique. COMPARISON:  CT abdomen pelvis dated 11/10/2023. FINDINGS: Evaluation of this exam is limited in the absence of intravenous contrast as well as due to respiratory motion. Lower chest: Partially visualized small bilateral pleural effusions. Large areas of consolidation involving the lower lobes bilaterally, progressed since the prior CT may represent atelectasis or infiltrate. There is 3 vessel coronary vascular calcification. No intra-abdominal free air. Hepatobiliary: The liver is unremarkable. No biliary dilatation. The gallbladder is contracted. Several gallstones. Pancreas: Unremarkable. No pancreatic ductal dilatation or surrounding inflammatory changes. Spleen: Normal in size without focal abnormality. Adrenals/Urinary Tract: The adrenal glands are unremarkable similar or slightly increased bilateral hydronephrosis and hydroureter. There is high attenuating content within the renal collecting systems bilaterally indicative of proteinaceous or hemorrhagic content. No stone. There is a moderate size left perinephric hematoma, new since the prior CT and measuring approximately 5.5 x 14 cm in greatest axial dimensions. Interval increase in the right perinephric stranding. The  urinary bladder is collapsed. Stomach/Bowel: Percutaneous gastrostomy with balloon in the body of the stomach. There is loose stool throughout the colon. There is no bowel obstruction. The appendix is normal. Vascular/Lymphatic: Mild aortoiliac atherosclerotic disease. The IVC is unremarkable. No portal venous gas. There is no adenopathy. Reproductive: The prostate is grossly unremarkable. Other: None Musculoskeletal: Osteopenia with degenerative changes of the spine. No acute osseous pathology. IMPRESSION: 1. Moderate size left perinephric hematoma, new since the prior CT. 2. Similar or slightly increased bilateral hydronephrosis and hydroureter. High attenuating content within the renal collecting systems bilaterally indicative of proteinaceous  or hemorrhagic content. No stone. 3. Percutaneous gastrostomy with balloon in the body of the stomach. No bowel obstruction. Normal appendix. 4. Cholelithiasis. 5. Partially visualized small bilateral pleural effusions. Large areas of consolidation involving the lower lobes bilaterally, progressed since the prior CT may represent atelectasis or infiltrate. 6.  Aortic Atherosclerosis (ICD10-I70.0). Electronically Signed   By: Vanetta Chou M.D.   On: 12/04/2023 10:42     Assessment/Plan:  Prolonged and Complicated hospital stay.  Aspiration pneumonia Hypoxia - intubated and then extubated Achalasia, dilated esophagus para esophageal hernia  PEG placement Dislodgement and peritonitis- exp lap on 5/27  Leucocytosis- multifactorial- reactive to hydronephrosis, perinephrici hematoma, aspiration pneumonia,  Been on antibiotics sine 6/30 - zosyn  started on 7/3 - I doubt antibiotics are helpful- Discussed with granddaughter -would DC on 7/11( after a week)  B/l hydroureteronephrosis- unclear etiology- bladder wall thickening. Did he have BOO No calculus CT scan today showed proteinaceous hemorrhagic material in kidneys- Will he need perc nephrostomy?  Left  perinephric hematoma ? etiology Recommend urology opinion ,   AKI since 5/26 now on dialysis

## 2023-12-05 NOTE — Plan of Care (Signed)

## 2023-12-05 NOTE — Plan of Care (Signed)
 Problem: Education: Goal: Knowledge of General Education information will improve Description: Including pain rating scale, medication(s)/side effects and non-pharmacologic comfort measures Outcome: Progressing   Problem: Health Behavior/Discharge Planning: Goal: Ability to manage health-related needs will improve Outcome: Progressing   Problem: Clinical Measurements: Goal: Ability to maintain clinical measurements within normal limits will improve Outcome: Progressing Goal: Will remain free from infection Outcome: Progressing Goal: Diagnostic test results will improve Outcome: Progressing Goal: Respiratory complications will improve Outcome: Progressing Goal: Cardiovascular complication will be avoided Outcome: Progressing   Problem: Activity: Goal: Risk for activity intolerance will decrease Outcome: Progressing   Problem: Nutrition: Goal: Adequate nutrition will be maintained Outcome: Progressing   Problem: Coping: Goal: Level of anxiety will decrease Outcome: Progressing   Problem: Elimination: Goal: Will not experience complications related to bowel motility Outcome: Progressing Goal: Will not experience complications related to urinary retention Outcome: Progressing   Problem: Pain Managment: Goal: General experience of comfort will improve and/or be controlled Outcome: Progressing   Problem: Safety: Goal: Ability to remain free from injury will improve Outcome: Progressing   Problem: Skin Integrity: Goal: Risk for impaired skin integrity will decrease Outcome: Progressing   Problem: Activity: Goal: Ability to tolerate increased activity will improve Outcome: Progressing   Problem: Clinical Measurements: Goal: Ability to maintain a body temperature in the normal range will improve Outcome: Progressing   Problem: Respiratory: Goal: Ability to maintain adequate ventilation will improve Outcome: Progressing Goal: Ability to maintain a clear airway  will improve Outcome: Progressing   Problem: Education: Goal: Ability to describe self-care measures that may prevent or decrease complications (Diabetes Survival Skills Education) will improve Outcome: Progressing Goal: Individualized Educational Video(s) Outcome: Progressing   Problem: Coping: Goal: Ability to adjust to condition or change in health will improve Outcome: Progressing   Problem: Fluid Volume: Goal: Ability to maintain a balanced intake and output will improve Outcome: Progressing   Problem: Health Behavior/Discharge Planning: Goal: Ability to identify and utilize available resources and services will improve Outcome: Progressing Goal: Ability to manage health-related needs will improve Outcome: Progressing   Problem: Metabolic: Goal: Ability to maintain appropriate glucose levels will improve Outcome: Progressing   Problem: Nutritional: Goal: Maintenance of adequate nutrition will improve Outcome: Progressing Goal: Progress toward achieving an optimal weight will improve Outcome: Progressing   Problem: Skin Integrity: Goal: Risk for impaired skin integrity will decrease Outcome: Progressing   Problem: Tissue Perfusion: Goal: Adequacy of tissue perfusion will improve Outcome: Progressing   Problem: Activity: Goal: Ability to tolerate increased activity will improve Outcome: Progressing   Problem: Respiratory: Goal: Ability to maintain a clear airway and adequate ventilation will improve Outcome: Progressing   Problem: Role Relationship: Goal: Method of communication will improve Outcome: Progressing   Problem: Fluid Volume: Goal: Hemodynamic stability will improve Outcome: Progressing   Problem: Clinical Measurements: Goal: Diagnostic test results will improve Outcome: Progressing Goal: Signs and symptoms of infection will decrease Outcome: Progressing   Problem: Respiratory: Goal: Ability to maintain adequate ventilation will  improve Outcome: Progressing   Problem: Education: Goal: Knowledge of disease and its progression will improve Outcome: Progressing   Problem: Health Behavior/Discharge Planning: Goal: Ability to manage health-related needs will improve Outcome: Progressing   Problem: Clinical Measurements: Goal: Complications related to the disease process or treatment will be avoided or minimized Outcome: Progressing Goal: Dialysis access will remain free of complications Outcome: Progressing   Problem: Activity: Goal: Activity intolerance will improve Outcome: Progressing   Problem: Fluid Volume: Goal: Fluid volume balance will  be maintained or improved Outcome: Progressing

## 2023-12-05 NOTE — Progress Notes (Signed)
 PROGRESS NOTE    Robert Vega  FMW:[id number] DOB: Nov 25, 1940 DOA: 10/05/2023 PCP: Jimmy Charlie FERNS, MD    Assessment & Plan:   Principal Problem:   Severe sepsis [facility]) Active Problems:   Gastrostomy complication (HCC)   Dysphagia   Achalasia of esophagus   AKI (acute kidney injury) (HCC)   Acute delirium   Acute respiratory failure with hypoxia and hypercapnia (HCC)   Essential hypertension, benign   GERD (gastroesophageal reflux disease)   Encounter for dialysis and dialysis catheter care [facility])   Multifocal pneumonia   History of dysphagia   Aspiration pneumonia (HCC)   Obesity (BMI 30-39.9)   Generalized weakness   Generalized abdominal pain   Malnutrition of moderate degree   Melena   Leukocytosis   Perinephric hematoma  Assessment and Plan:  Failure to thrive: secondary to all below. Continue w/ full code, full scope of care as per pt's granddaughter. Poor prognosis. Palliative care re-consulted    SIRS: started on night on 11/25/23. W/ leukocytosis, tachypnea, fever and unknown source. Started on IV broad sprectrum abxs, IV flagyl , vanco, cefepime  initially. Continue on IV zosyn  until 12/06/23 as per ID. Unlikely to have acute infection as per ID.    AKI: secondary to ATN as per nephro. On HD. Nephro following and recs apprec  Left perinephric hematoma: w/ b/l hydronephrosis & hydroureter. May get b/l ureteral stents on Monday as per uro. No stone. Etiology unclear. No intervention for hematoma as per uro.    Hyperkalemia: resolved   Possible ileus: resolved.    ACD: likely secondary to CKD. S/p 2 units of pRBCs transfused so far. Continue on iron supplement. H&H are labile. Will transfuse if Hb < 7.0    Achalasia & dysphagia: w/ high aspiration risk. S/p botulinum toxin injection on 5/20, several EGD and SLP evals. Not safe for PO intake unless family wants to accept risk that he will aspirate again. Continue w/ tube feeds   Severe sepsis: resolved. See Dr.  Lamount note on how pt met severe sepsis criteria. Initially w/ aspiration pna. Later in hospital stay w/ intra-abdominal infection secondary to PEG dislodged. Resolved    Acute hypoxic & hypercapnic respiratory failure: s/p intubation, ventilation & extubation. Continue on supplemental oxygen and wean as tolerated.    Chronic pEF CHF: XR shows interstitial edema/CHF.  Fluid/volume management w/ HD   Delirium: vs mild cognitive impairment vs anoxic brain injury during critical illness in the ICU, requiring intubation & pressors.  Pt would not answer any questions today, mental status waxes and wanes. Continue w/ supportive care. Granddaughter asked to d/c prn antipsychotics.   GERD: continue on PPI    HTN: BP is WNL. Continue on midodrine     Generalized weakness: PT/OT recs SNF   Obesity: BMI 32.8. Would benefit from weight loss   Moderate malnutrition: continue w/ tube feeds   DVT prophylaxis: heparin   Code Status: full Family Communication: Disposition Plan: possibly d/c to SNF  Level of care: Med-Surg  Status is: Inpatient Remains inpatient appropriate because: severity of illness    Consultants:  ID ICU Nephro Uro   Procedures:   Antimicrobials: zosyn     Subjective: Pt still cannot answer any questions today  Objective: Vitals:   12/04/23 1913 12/04/23 2019 12/05/23 0459 12/05/23 0718  BP: (!) 117/54 (!) 126/57 (!) 114/54 112/60  Pulse: 83 70 78 69  Resp: 18 18 18 16   Temp: (!) (P) 97 F (36.1 C) 97.8 F (36.6 C) 98.9 F (37.2  C) 97.8 F (36.6 C)  TempSrc: (P) Axillary Oral Oral Oral  SpO2: 100% 99% 98% 98%  Weight:      Height:       No intake or output data in the 24 hours ending 12/05/23 0925  Filed Weights   11/29/23 0830 11/29/23 1230 12/02/23 1726  Weight: 96.8 kg 95.3 kg 94.3 kg    Examination:  General exam: Appears confused & uncomfortable  Respiratory system: decreased breath sounds b/l  Gastrointestinal system: abd is soft, NT,  obese, hypoactive bowel sounds Central nervous system: Lethargic. Moves all extremities  Psychiatry: Judgement and insight appear poor. Flat mood and affect    Data Reviewed: I have personally reviewed following labs and imaging studies  CBC: Recent Labs  Lab 11/28/23 0931 11/29/23 0825 11/30/23 0911 12/01/23 1006 12/02/23 1040 12/03/23 0959 12/05/23 0522  WBC 25.4* 24.9* 20.3* 18.7* 18.4* 18.8* 17.1*  NEUTROABS 21.4* 20.1*  --   --   --  13.8*  --   HGB 7.2* 7.4* 7.2* 8.1* 7.6* 7.6* 7.3*  HCT 22.4* 23.7* 23.0* 26.6* 24.8* 24.6* 23.6*  MCV 90.3 92.9 92.0 94.0 92.9 93.2 91.1  PLT 400 441* 401* 463* 513* 468* 451*   Basic Metabolic Panel: Recent Labs  Lab 11/29/23 0840 11/30/23 0911 12/01/23 1006 12/02/23 1040 12/03/23 0959 12/05/23 0522  NA 131* 135 137 138 136 132*  K 4.2 4.3 4.2 3.6 3.7 3.4*  CL 92* 93* 98 98 97* 93*  CO2 24 26 24 22 26 26   GLUCOSE 123* 132* 121* 140* 117* 124*  BUN 94* 60* 83* 96* 65* 44*  CREATININE 6.74* 4.50* 5.92* 7.56* 5.21* 4.47*  CALCIUM  7.5* 7.6* 8.0* 7.9* 7.8* 8.0*  PHOS 5.0*  --   --   --  5.7*  --    GFR: Estimated Creatinine Clearance: 13.9 mL/min (A) (by C-G formula based on SCr of 4.47 mg/dL (H)). Liver Function Tests: Recent Labs  Lab 11/29/23 0840 12/03/23 0959  ALBUMIN  1.7* 1.8*   No results for input(s): LIPASE, AMYLASE in the last 168 hours. No results for input(s): AMMONIA in the last 168 hours. Coagulation Profile: No results for input(s): INR, PROTIME in the last 168 hours. Cardiac Enzymes: No results for input(s): CKTOTAL, CKMB, CKMBINDEX, TROPONINI in the last 168 hours. BNP (last 3 results) No results for input(s): PROBNP in the last 8760 hours. HbA1C: No results for input(s): HGBA1C in the last 72 hours. CBG: Recent Labs  Lab 12/03/23 1625 12/03/23 1925 12/04/23 0928 12/04/23 2242 12/05/23 0720  GLUCAP 134* 113* 126* 97 119*   Lipid Profile: No results for input(s): CHOL,  HDL, LDLCALC, TRIG, CHOLHDL, LDLDIRECT in the last 72 hours. Thyroid  Function Tests: No results for input(s): TSH, T4TOTAL, FREET4, T3FREE, THYROIDAB in the last 72 hours. Anemia Panel: No results for input(s): VITAMINB12, FOLATE, FERRITIN, TIBC, IRON, RETICCTPCT in the last 72 hours. Sepsis Labs: No results for input(s): PROCALCITON, LATICACIDVEN in the last 168 hours.  Recent Results (from the past 240 hours)  Culture, blood (x 2)     Status: None   Collection Time: 11/25/23 10:31 PM   Specimen: BLOOD  Result Value Ref Range Status   Specimen Description BLOOD BLOOD LEFT ARM  Final   Special Requests   Final    BOTTLES DRAWN AEROBIC AND ANAEROBIC Blood Culture adequate volume   Culture   Final    NO GROWTH 5 DAYS Performed at [facility], [address] [place], [place] [address]  Report Status 11/30/2023 FINAL  Final  Culture, blood (x 2)     Status: None   Collection Time: 11/25/23 10:31 PM   Specimen: BLOOD  Result Value Ref Range Status   Specimen Description BLOOD BLOOD RIGHT ARM  Final   Special Requests   Final    BOTTLES DRAWN AEROBIC AND ANAEROBIC Blood Culture adequate volume   Culture   Final    NO GROWTH 5 DAYS Performed at [facility], [address] [place], [place] [address]    Report Status 11/30/2023 FINAL  Final  Gastrointestinal Panel by PCR , Stool     Status: None   Collection Time: 11/30/23  5:10 PM   Specimen: Stool  Result Value Ref Range Status   Campylobacter species NOT DETECTED NOT DETECTED Final   Plesimonas shigelloides NOT DETECTED NOT DETECTED Final   Salmonella species NOT DETECTED NOT DETECTED Final   Yersinia enterocolitica NOT DETECTED NOT DETECTED Final   Vibrio species NOT DETECTED NOT DETECTED Final   Vibrio cholerae NOT DETECTED NOT DETECTED Final   Enteroaggregative E coli (EAEC) NOT DETECTED NOT DETECTED Final   Enteropathogenic E coli (EPEC) NOT DETECTED NOT DETECTED  Final   Enterotoxigenic E coli (ETEC) NOT DETECTED NOT DETECTED Final   Shiga like toxin producing E coli (STEC) NOT DETECTED NOT DETECTED Final   Shigella/Enteroinvasive E coli (EIEC) NOT DETECTED NOT DETECTED Final   Cryptosporidium NOT DETECTED NOT DETECTED Final   Cyclospora cayetanensis NOT DETECTED NOT DETECTED Final   Entamoeba histolytica NOT DETECTED NOT DETECTED Final   Giardia lamblia NOT DETECTED NOT DETECTED Final   Adenovirus F40/41 NOT DETECTED NOT DETECTED Final   Astrovirus NOT DETECTED NOT DETECTED Final   Norovirus GI/GII NOT DETECTED NOT DETECTED Final   Rotavirus A NOT DETECTED NOT DETECTED Final   Sapovirus (I, II, IV, and V) NOT DETECTED NOT DETECTED Final    Comment: Performed at [facility], [address] [place], [place] [address]         Radiology Studies: CT ABDOMEN PELVIS WO CONTRAST Result Date: 12/04/2023 CLINICAL DATA:  Persistent leukocytosis. Infection of uncertain etiology. EXAM: CT ABDOMEN AND PELVIS WITHOUT CONTRAST TECHNIQUE: Multidetector CT imaging of the abdomen and pelvis was performed following the standard protocol without IV contrast. RADIATION DOSE REDUCTION: This exam was performed according to the departmental dose-optimization program which includes automated exposure control, adjustment of the mA and/or kV according to patient size and/or use of iterative reconstruction technique. COMPARISON:  CT abdomen pelvis dated 11/10/2023. FINDINGS: Evaluation of this exam is limited in the absence of intravenous contrast as well as due to respiratory motion. Lower chest: Partially visualized small bilateral pleural effusions. Large areas of consolidation involving the lower lobes bilaterally, progressed since the prior CT may represent atelectasis or infiltrate. There is 3 vessel coronary vascular calcification. No intra-abdominal free air. Hepatobiliary: The liver is unremarkable. No biliary dilatation. The gallbladder is contracted. Several  gallstones. Pancreas: Unremarkable. No pancreatic ductal dilatation or surrounding inflammatory changes. Spleen: Normal in size without focal abnormality. Adrenals/Urinary Tract: The adrenal glands are unremarkable similar or slightly increased bilateral hydronephrosis and hydroureter. There is high attenuating content within the renal collecting systems bilaterally indicative of proteinaceous or hemorrhagic content. No stone. There is a moderate size left perinephric hematoma, new since the prior CT and measuring approximately 5.5 x 14 cm in greatest axial dimensions. Interval increase in the right perinephric stranding. The urinary bladder is collapsed. Stomach/Bowel: Percutaneous gastrostomy with balloon in the  body of the stomach. There is loose stool throughout the colon. There is no bowel obstruction. The appendix is normal. Vascular/Lymphatic: Mild aortoiliac atherosclerotic disease. The IVC is unremarkable. No portal venous gas. There is no adenopathy. Reproductive: The prostate is grossly unremarkable. Other: None Musculoskeletal: Osteopenia with degenerative changes of the spine. No acute osseous pathology. IMPRESSION: 1. Moderate size left perinephric hematoma, new since the prior CT. 2. Similar or slightly increased bilateral hydronephrosis and hydroureter. High attenuating content within the renal collecting systems bilaterally indicative of proteinaceous or hemorrhagic content. No stone. 3. Percutaneous gastrostomy with balloon in the body of the stomach. No bowel obstruction. Normal appendix. 4. Cholelithiasis. 5. Partially visualized small bilateral pleural effusions. Large areas of consolidation involving the lower lobes bilaterally, progressed since the prior CT may represent atelectasis or infiltrate. 6.  Aortic Atherosclerosis (ICD10-I70.0). Electronically Signed   By: Vanetta Chou M.D.   On: 12/04/2023 10:42   MR BRAIN W WO CONTRAST Result Date: 12/03/2023 CLINICAL DATA:  Provided history:  Mental status change, unknown cause. EXAM: MRI HEAD WITHOUT AND WITH CONTRAST TECHNIQUE: Multiplanar, multiecho pulse sequences of the brain and surrounding structures were obtained without and with intravenous contrast. CONTRAST:  10mL GADAVIST  GADOBUTROL  1 MMOL/ML IV SOLN COMPARISON:  Head CT 10/11/2023. Brain MRI 08/16/2021. FINDINGS: Brain: Generalized cerebral atrophy. Prominence of the ventricles and sulci, which appears commensurate. Multifocal T2 FLAIR hyperintense signal abnormality within the cerebral white matter, nonspecific but compatible with moderate chronic small vessel ischemic disease. There are a few nonspecific chronic microhemorrhages scattered within the supratentorial brain. There is no acute infarct. No evidence of an intracranial mass. No extra-axial fluid collection. No midline shift. No pathologic intracranial enhancement identified. Vascular: Maintained flow voids within the proximal large arterial vessels. Skull and upper cervical spine: No focal worrisome marrow lesion. Sinuses/Orbits: No mass or acute finding within the imaged orbits. Mild mucosal thickening within the right maxillary and right sphenoid sinuses. Other: Trace fluid within left mastoid air cells. IMPRESSION: 1.  No evidence of an acute intracranial abnormality. 2. Moderate chronic small vessel ischemic changes within the cerebral white matter, similar to the prior MRI of 08/16/2021. 3. Few nonspecific chronic microhemorrhages within the supratentorial brain. 4. Generalized cerebral atrophy. 5. Mild mucosal thickening within the right maxillary and right sphenoid sinuses. Electronically Signed   By: Rockey Childs D.O.   On: 12/03/2023 13:41        Scheduled Meds:  sodium chloride    Intravenous Once   Chlorhexidine  Gluconate Cloth  6 each Topical Q0600   epoetin  alfa-epbx (RETACRIT ) injection  10,000 Units Intravenous Q M,W,F-HD   feeding supplement (NEPRO CARB STEADY)  237 mL Oral 5 X Daily   feeding supplement  (PROSource TF20)  60 mL Per Tube Daily   ferrous sulfate   325 mg Per Tube Daily   fiber supplement (BANATROL TF)  60 mL Per Tube BID   free water   60 mL Per Tube 5 X Daily   gentamicin  ointment   Topical TID   heparin  injection (subcutaneous)  5,000 Units Subcutaneous Q8H   insulin  aspart  10 Units Subcutaneous Once   midodrine   5 mg Per Tube TID WC   multivitamin  1 tablet Per Tube QHS   pantoprazole  (PROTONIX ) IV  40 mg Intravenous Q12H   Continuous Infusions:  albumin  human 25 g (12/04/23 1630)   piperacillin -tazobactam (ZOSYN )  IV 2.25 g (12/05/23 0515)     LOS: 61 days      Anthony CHRISTELLA Pouch, MD  [facility] Pager [phone]  If 7PM-7AM, please contact night-coverage [email or web address] 12/05/2023, 9:25 AM

## 2023-12-05 NOTE — Progress Notes (Signed)
 Physical Therapy Treatment Patient Details Name: Robert Vega MRN: [id number] DOB: 1940-09-17 Today's Date: 12/05/2023   History of Present Illness 83 y.o male with significant PMH of OSA, GERD, Obesity, HTN, Dysphagia: EGD 03/2022 with food in upper esophagus complicated by aspiration event, cardiac arrest with round of CPR, and post resuscitation EGD with concern for lack of peristalsis. Pt presented to ED on 10/05/2023 with hypoxia, fever and generalized weakness; developed acute respiratory failure requiring intubation 5/10 due to aspiration pneumonia, extubated 5/15, but had significant agitation required brief course of Precedex ; pt now s/p exploratory laparotomy with closure of gastrotomy and insertion of gastrostomy tube on 10/22/23.  PT order discontinued by MD 11/10/23 and new PT consult received 11/14/23.  Pt s/p R temporary femoral vein dialysis catheter placement 11/11/23; now removed; pt now with HD perm cath.    PT Comments  Today's treatment session requiring 2-3 personnel to safely complete tasks. Pt continues to require total a of at least 2 people to complete rolling<>supine<>sit this date, unable to participate in movement. Once in sitting position, after extended period of time pt able to progress from a total a to mod a, roughly was able to sit EOB for 8 minutes. Pt donned hoyer lift equipment and transferred to recliner with NT help, pt soiled therefore had to return to supine in bed for pericare. Overall pt more awake this session but continues to require max multimodal cueing, unable to truly participate in rehab without total a. Pt will continue to benefit from skilled PT interventions to meet therapy goals and improve QoL. PT to follow acutely as appropriate.     If plan is discharge home, recommend the following: Two people to help with walking and/or transfers;Two people to help with bathing/dressing/bathroom;Assistance with cooking/housework;Direct supervision/assist for  medications management;Direct supervision/assist for financial management;Assist for transportation;Help with stairs or ramp for entrance;Supervision due to cognitive status   Can travel by private vehicle     No  Equipment Recommendations  None recommended by PT    Recommendations for Other Services       Precautions / Restrictions Precautions Precautions: Fall Recall of Precautions/Restrictions: Impaired Precaution/Restrictions Comments: PEG; perm cath (HD) Restrictions Weight Bearing Restrictions Per Provider Order: No Other Position/Activity Restrictions: Safety mitts donned     Mobility  Bed Mobility Overal bed mobility: Needs Assistance Bed Mobility: Rolling, Supine to Sit, Sit to Supine Rolling: Total assist, +2 for physical assistance   Supine to sit: Total assist, +2 for physical assistance, +2 for safety/equipment Sit to supine: Total assist, +2 for physical assistance, +2 for safety/equipment   General bed mobility comments: pt continues to require total a x2 to perform supine<>sit, x1 PT behind performing max truncal support while x1 PT infront providing bilat knee blocks, able to progress back support from total a to mod a, pt able to sit for roughly 8 minutes    Transfers Overall transfer level: Needs assistance Equipment used: Ambulation equipment used Transfers: Bed to chair/wheelchair/BSC             General transfer comment: x3 people needed to donn lift equipment, pt with bowel movement in chair therefore had to transfer back to bed, assisted NT on pericare, performed total a bed rolls Transfer via Lift Equipment:  Secondary school teacher)  Ambulation/Gait               General Gait Details: not appropriate at this time   Stairs  Wheelchair Mobility     Tilt Bed    Modified Rankin (Stroke Patients Only)       Balance Overall balance assessment: Needs assistance Sitting-balance support: Feet supported, Bilateral upper  extremity supported Sitting balance-Leahy Scale: Zero Sitting balance - Comments: pt does not hold himself up this date, needs total a x2 (1 in front, 1 behind) d/t posterior lean, able to progress from total a to mod a with increased sitting time, pt very startled with movement outside of BOS and frequent cues/reassurance given to patient to encourage participation. Poor seated balance with zero righting response Postural control: Posterior lean     Standing balance comment: NT due to poor level of alertness and ability to perform desired commands                            Communication Communication Communication: Impaired Factors Affecting Communication: Other (comment)  Cognition Arousal: Lethargic Behavior During Therapy: Impulsive, Restless   PT - Cognitive impairments: No family/caregiver present to determine baseline, Difficult to assess, Orientation, Awareness, Memory, Attention, Initiation, Sequencing, Problem solving, Safety/Judgement Difficult to assess due to: Impaired communication, Level of arousal Orientation impairments: Place, Time, Situation, Person                   PT - Cognition Comments: pt more alert this date but remains lethargic and unable to formulate words. Able to say i don't know and continues to grunt throughout session Following commands: Impaired Following commands impaired: Follows one step commands inconsistently    Cueing Cueing Techniques: Verbal cues, Gestural cues, Tactile cues, Visual cues  Exercises Other Exercises Other Exercises: weight shifts (ant/post) and lateral, PROM neck (rotation), bilat UE shoulder/elbow flexion, gave him a wet washcloth and placed on the side of his face with no response to wipe/reach away without max assist for facilitation. Despite eyes open throughout session, pt unable to meaningfully participate, continues to need total assist therefore decreasing tier level/frequency.    General Comments         Pertinent Vitals/Pain Pain Assessment Pain Assessment: Faces Faces Pain Scale: Hurts even more    Home Living                          Prior Function            PT Goals (current goals can now be found in the care plan section) Acute Rehab PT Goals Patient Stated Goal: none stated PT Goal Formulation: Patient unable to participate in goal setting Time For Goal Achievement: 12/10/23 Potential to Achieve Goals: Fair Progress towards PT goals: Progressing toward goals    Frequency    Min 1X/week      PT Plan      Co-evaluation     PT goals addressed during session: Mobility/safety with mobility        AM-PAC PT 6 Clicks Mobility   Outcome Measure  Help needed turning from your back to your side while in a flat bed without using bedrails?: Total Help needed moving from lying on your back to sitting on the side of a flat bed without using bedrails?: Total Help needed moving to and from a bed to a chair (including a wheelchair)?: Total Help needed standing up from a chair using your arms (e.g., wheelchair or bedside chair)?: Total Help needed to walk in hospital room?: Total   6 Click Score: 5  End of Session Equipment Utilized During Treatment: Oxygen Activity Tolerance: Patient limited by lethargy Patient left: in bed;with call bell/phone within reach;with bed alarm set;with family/visitor present Nurse Communication: Mobility status;Need for lift equipment PT Visit Diagnosis: Other abnormalities of gait and mobility (R26.89);Muscle weakness (generalized) (M62.81)     Time: 1040-1120 PT Time Calculation (min) (ACUTE ONLY): 40 min  Charges:    $Therapeutic Activity: 38-52 mins PT General Charges $$ ACUTE PT VISIT: 1 Visit                       Lia Vigilante Romero-Perozo, SPT  12/05/2023, 2:09 PM

## 2023-12-06 DIAGNOSIS — T17908A Unspecified foreign body in respiratory tract, part unspecified causing other injury, initial encounter: Secondary | ICD-10-CM | POA: Diagnosis not present

## 2023-12-06 DIAGNOSIS — A419 Sepsis, unspecified organism: Secondary | ICD-10-CM | POA: Diagnosis not present

## 2023-12-06 DIAGNOSIS — K220 Achalasia of cardia: Secondary | ICD-10-CM | POA: Diagnosis not present

## 2023-12-06 DIAGNOSIS — R509 Fever, unspecified: Secondary | ICD-10-CM | POA: Diagnosis not present

## 2023-12-06 DIAGNOSIS — Z87898 Personal history of other specified conditions: Secondary | ICD-10-CM | POA: Diagnosis not present

## 2023-12-06 DIAGNOSIS — R0902 Hypoxemia: Secondary | ICD-10-CM | POA: Diagnosis not present

## 2023-12-06 DIAGNOSIS — N1330 Unspecified hydronephrosis: Secondary | ICD-10-CM

## 2023-12-06 DIAGNOSIS — D72829 Elevated white blood cell count, unspecified: Secondary | ICD-10-CM | POA: Diagnosis not present

## 2023-12-06 DIAGNOSIS — R627 Adult failure to thrive: Secondary | ICD-10-CM | POA: Diagnosis not present

## 2023-12-06 DIAGNOSIS — J690 Pneumonitis due to inhalation of food and vomit: Secondary | ICD-10-CM | POA: Diagnosis not present

## 2023-12-06 LAB — CBC
HCT: 24.1 % — ABNORMAL LOW (ref 39.0–52.0)
Hemoglobin: 7.4 g/dL — ABNORMAL LOW (ref 13.0–17.0)
MCH: 28.2 pg (ref 26.0–34.0)
MCHC: 30.7 g/dL (ref 30.0–36.0)
MCV: 92 fL (ref 80.0–100.0)
Platelets: 482 K/uL — ABNORMAL HIGH (ref 150–400)
RBC: 2.62 MIL/uL — ABNORMAL LOW (ref 4.22–5.81)
RDW: 15.7 % — ABNORMAL HIGH (ref 11.5–15.5)
WBC: 15.7 K/uL — ABNORMAL HIGH (ref 4.0–10.5)
nRBC: 0 % (ref 0.0–0.2)

## 2023-12-06 LAB — BASIC METABOLIC PANEL WITH GFR
Anion gap: 15 (ref 5–15)
BUN: 63 mg/dL — ABNORMAL HIGH (ref 8–23)
CO2: 26 mmol/L (ref 22–32)
Calcium: 7.7 mg/dL — ABNORMAL LOW (ref 8.9–10.3)
Chloride: 93 mmol/L — ABNORMAL LOW (ref 98–111)
Creatinine, Ser: 5.96 mg/dL — ABNORMAL HIGH (ref 0.61–1.24)
GFR, Estimated: 9 mL/min — ABNORMAL LOW (ref >60)
Glucose, Bld: 120 mg/dL — ABNORMAL HIGH (ref 70–99)
Potassium: 3.6 mmol/L (ref 3.5–5.1)
Sodium: 134 mmol/L — ABNORMAL LOW (ref 135–145)

## 2023-12-06 LAB — GLUCOSE, CAPILLARY
Glucose-Capillary: 115 mg/dL — ABNORMAL HIGH (ref 70–99)
Glucose-Capillary: 104 mg/dL — ABNORMAL HIGH (ref 70–99)
Glucose-Capillary: 135 mg/dL — ABNORMAL HIGH (ref 70–99)
Glucose-Capillary: 103 mg/dL — ABNORMAL HIGH (ref 70–99)

## 2023-12-06 MED ORDER — PIPERACILLIN-TAZOBACTAM IN DEX 2-0.25 GM/50ML IV SOLN
2.2500 g | Freq: Three times a day (TID) | INTRAVENOUS | Status: DC
  Administered 2023-12-06 – 2023-12-10 (×13): 2.25 g via INTRAVENOUS
  Filled 2023-12-06 (×14): qty 50

## 2023-12-06 MED ORDER — EPOETIN ALFA-EPBX 10000 UNIT/ML IJ SOLN
INTRAMUSCULAR | Status: AC
  Filled 2023-12-06: qty 1

## 2023-12-06 MED ORDER — PIPERACILLIN-TAZOBACTAM IN DEX 2-0.25 GM/50ML IV SOLN
2.2500 g | Freq: Three times a day (TID) | INTRAVENOUS | Status: DC
Start: 2023-12-06 — End: 2023-12-06
  Filled 2023-12-06: qty 50

## 2023-12-06 MED ORDER — RISAQUAD PO CAPS
1.0000 | ORAL_CAPSULE | Freq: Every day | ORAL | Status: DC
  Administered 2023-12-06 – 2023-12-10 (×5): 1 via ORAL
  Filled 2023-12-06 (×7): qty 1

## 2023-12-06 NOTE — Progress Notes (Signed)
 Spoke with Christopher Nose, per TOC. He has multiple concerns about discharge, no questions related to dialysis. TOC notified.

## 2023-12-06 NOTE — Progress Notes (Addendum)
  Received patient in bed to unit.   Informed consent signed and in chart.    TX duration:3.50     Transported by  Hand-off given to patient's nurse. Pt tolerated tx well.   Access used: R Cath Access issues: none   Total UF removed: 1.0 kg Medication(s) given: Epo 10,000 Post HD VS: wnl      Robert Sar LPN [facility]

## 2023-12-06 NOTE — Plan of Care (Signed)
 Problem: Education: Goal: Knowledge of General Education information will improve Description: Including pain rating scale, medication(s)/side effects and non-pharmacologic comfort measures Outcome: Progressing   Problem: Health Behavior/Discharge Planning: Goal: Ability to manage health-related needs will improve Outcome: Progressing   Problem: Clinical Measurements: Goal: Ability to maintain clinical measurements within normal limits will improve Outcome: Progressing Goal: Will remain free from infection Outcome: Progressing Goal: Diagnostic test results will improve Outcome: Progressing Goal: Respiratory complications will improve Outcome: Progressing Goal: Cardiovascular complication will be avoided Outcome: Progressing   Problem: Activity: Goal: Risk for activity intolerance will decrease Outcome: Progressing   Problem: Nutrition: Goal: Adequate nutrition will be maintained Outcome: Progressing   Problem: Coping: Goal: Level of anxiety will decrease Outcome: Progressing   Problem: Elimination: Goal: Will not experience complications related to bowel motility Outcome: Progressing Goal: Will not experience complications related to urinary retention Outcome: Progressing   Problem: Pain Managment: Goal: General experience of comfort will improve and/or be controlled Outcome: Progressing   Problem: Safety: Goal: Ability to remain free from injury will improve Outcome: Progressing   Problem: Skin Integrity: Goal: Risk for impaired skin integrity will decrease Outcome: Progressing   Problem: Activity: Goal: Ability to tolerate increased activity will improve Outcome: Progressing   Problem: Clinical Measurements: Goal: Ability to maintain a body temperature in the normal range will improve Outcome: Progressing   Problem: Respiratory: Goal: Ability to maintain adequate ventilation will improve Outcome: Progressing Goal: Ability to maintain a clear airway  will improve Outcome: Progressing   Problem: Education: Goal: Ability to describe self-care measures that may prevent or decrease complications (Diabetes Survival Skills Education) will improve Outcome: Progressing Goal: Individualized Educational Video(s) Outcome: Progressing   Problem: Coping: Goal: Ability to adjust to condition or change in health will improve Outcome: Progressing   Problem: Fluid Volume: Goal: Ability to maintain a balanced intake and output will improve Outcome: Progressing   Problem: Health Behavior/Discharge Planning: Goal: Ability to identify and utilize available resources and services will improve Outcome: Progressing Goal: Ability to manage health-related needs will improve Outcome: Progressing   Problem: Metabolic: Goal: Ability to maintain appropriate glucose levels will improve Outcome: Progressing   Problem: Nutritional: Goal: Maintenance of adequate nutrition will improve Outcome: Progressing Goal: Progress toward achieving an optimal weight will improve Outcome: Progressing   Problem: Skin Integrity: Goal: Risk for impaired skin integrity will decrease Outcome: Progressing   Problem: Tissue Perfusion: Goal: Adequacy of tissue perfusion will improve Outcome: Progressing   Problem: Activity: Goal: Ability to tolerate increased activity will improve Outcome: Progressing   Problem: Respiratory: Goal: Ability to maintain a clear airway and adequate ventilation will improve Outcome: Progressing   Problem: Role Relationship: Goal: Method of communication will improve Outcome: Progressing   Problem: Fluid Volume: Goal: Hemodynamic stability will improve Outcome: Progressing   Problem: Clinical Measurements: Goal: Diagnostic test results will improve Outcome: Progressing Goal: Signs and symptoms of infection will decrease Outcome: Progressing   Problem: Respiratory: Goal: Ability to maintain adequate ventilation will  improve Outcome: Progressing   Problem: Education: Goal: Knowledge of disease and its progression will improve Outcome: Progressing   Problem: Health Behavior/Discharge Planning: Goal: Ability to manage health-related needs will improve Outcome: Progressing   Problem: Clinical Measurements: Goal: Complications related to the disease process or treatment will be avoided or minimized Outcome: Progressing Goal: Dialysis access will remain free of complications Outcome: Progressing   Problem: Activity: Goal: Activity intolerance will improve Outcome: Progressing   Problem: Fluid Volume: Goal: Fluid volume balance will  be maintained or improved Outcome: Progressing

## 2023-12-06 NOTE — Progress Notes (Signed)
 [facility]  ROUNDING NOTE   Subjective:   Patient seen and evaluated during dialysis   HEMODIALYSIS FLOWSHEET:  Blood Flow Rate (mL/min): 400 mL/min Arterial Pressure (mmHg): -198.38 mmHg Venous Pressure (mmHg): 168.88 mmHg TMP (mmHg): 4.04 mmHg Ultrafiltration Rate (mL/min): 543 mL/min Dialysate Flow Rate (mL/min): 299 ml/min Dialysis Fluid Bolus: Normal Saline Bolus Amount (mL): 100 mL  Tolerating treatment well, alert Seated in chair Continues to moan softly  Objective:  Vital signs in last 24 hours:  Temp:  [98.3 F (36.8 C)-98.7 F (37.1 C)] 98.7 F (37.1 C) (07/11 0830) Pulse Rate:  [52-78] 53 (07/11 1000) Resp:  [16-27] 22 (07/11 1000) BP: (119-138)/(38-72) 125/62 (07/11 1000) SpO2:  [91 %-100 %] 95 % (07/11 1000)  Weight change:  Filed Weights   11/29/23 0830 11/29/23 1230 12/02/23 1726  Weight: 96.8 kg 95.3 kg 94.3 kg    Intake/Output: No intake/output data recorded.   Intake/Output this shift:  No intake/output data recorded.  Physical Exam: General: Chronically ill appearing, laying in bed  Head: Oral mucosa moist  Eyes: Anicteric  Lungs:  Diminished  Heart: Regular rate and rhythm  Abdomen:  Peg tube in place, +distended  Extremities: no peripheral edema.  Neurologic: Alert  Skin: No lesions  Access: Rt internal jugular permcath    Basic Metabolic Panel: Recent Labs  Lab 12/01/23 1006 12/02/23 1040 12/03/23 0959 12/05/23 0522 12/06/23 0415  NA 137 138 136 132* 134*  K 4.2 3.6 3.7 3.4* 3.6  CL 98 98 97* 93* 93*  CO2 24 22 26 26 26   GLUCOSE 121* 140* 117* 124* 120*  BUN 83* 96* 65* 44* 63*  CREATININE 5.92* 7.56* 5.21* 4.47* 5.96*  CALCIUM  8.0* 7.9* 7.8* 8.0* 7.7*  PHOS  --   --  5.7*  --   --     Liver Function Tests: Recent Labs  Lab 12/03/23 0959  ALBUMIN  1.8*   No results for input(s): LIPASE, AMYLASE in the last 168 hours. No results for input(s): AMMONIA in the last 168 hours.  CBC: Recent Labs   Lab 12/01/23 1006 12/02/23 1040 12/03/23 0959 12/05/23 0522 12/06/23 0415  WBC 18.7* 18.4* 18.8* 17.1* 15.7*  NEUTROABS  --   --  13.8*  --   --   HGB 8.1* 7.6* 7.6* 7.3* 7.4*  HCT 26.6* 24.8* 24.6* 23.6* 24.1*  MCV 94.0 92.9 93.2 91.1 92.0  PLT 463* 513* 468* 451* 482*    Cardiac Enzymes: No results for input(s): CKTOTAL, CKMB, CKMBINDEX, TROPONINI in the last 168 hours.  BNP: Invalid input(s): POCBNP  CBG: Recent Labs  Lab 12/04/23 2242 12/05/23 0720 12/05/23 1133 12/05/23 1616 12/06/23 0618  GLUCAP 97 119* 129* 120* 115*    Microbiology: Results for orders placed or performed during the hospital encounter of 10/05/23  Blood Culture (routine x 2)     Status: None   Collection Time: 10/05/23  5:06 AM   Specimen: BLOOD  Result Value Ref Range Status   Specimen Description BLOOD LA  Final   Special Requests   Final    BOTTLES DRAWN AEROBIC AND ANAEROBIC Blood Culture results may not be optimal due to an inadequate volume of blood received in culture bottles   Culture   Final    NO GROWTH 5 DAYS Performed at [facility], [address], [place], [place] [address]    Report Status 10/10/2023 FINAL  Final  Blood Culture (routine x 2)     Status: None   Collection Time:  10/05/23  5:07 AM   Specimen: BLOOD  Result Value Ref Range Status   Specimen Description BLOOD RA  Final   Special Requests   Final    BOTTLES DRAWN AEROBIC AND ANAEROBIC Blood Culture results may not be optimal due to an inadequate volume of blood received in culture bottles   Culture   Final    NO GROWTH 5 DAYS Performed at [facility], [address] [place], [place] [address]    Report Status 10/10/2023 FINAL  Final  Resp panel by RT-PCR (RSV, Flu A&B, Covid) Anterior Nasal Swab     Status: None   Collection Time: 10/05/23  5:56 AM   Specimen: Anterior Nasal Swab  Result Value Ref Range Status   SARS Coronavirus 2 by RT PCR NEGATIVE NEGATIVE Final     Comment: (NOTE) SARS-CoV-2 target nucleic acids are NOT DETECTED.  The SARS-CoV-2 RNA is generally detectable in upper respiratory specimens during the acute phase of infection. The lowest concentration of SARS-CoV-2 viral copies this assay can detect is 138 copies/mL. A negative result does not preclude SARS-Cov-2 infection and should not be used as the sole basis for treatment or other patient management decisions. A negative result may occur with  improper specimen collection/handling, submission of specimen other than nasopharyngeal swab, presence of viral mutation(s) within the areas targeted by this assay, and inadequate number of viral copies(<138 copies/mL). A negative result must be combined with clinical observations, patient history, and epidemiological information. The expected result is Negative.  Fact Sheet for Patients:  BloggerCourse.com  Fact Sheet for Healthcare Providers:  SeriousBroker.it  This test is no t yet approved or cleared by the [place]  FDA and  has been authorized for detection and/or diagnosis of SARS-CoV-2 by FDA under an Emergency Use Authorization (EUA). This EUA will remain  in effect (meaning this test can be used) for the duration of the COVID-19 declaration under Section 564(b)(1) of the Act, 21 U.S.C.section 360bbb-3(b)(1), unless the authorization is terminated  or revoked sooner.       Influenza A by PCR NEGATIVE NEGATIVE Final   Influenza B by PCR NEGATIVE NEGATIVE Final    Comment: (NOTE) The Xpert Xpress SARS-CoV-2/FLU/RSV plus assay is intended as an aid in the diagnosis of influenza from Nasopharyngeal swab specimens and should not be used as a sole basis for treatment. Nasal washings and aspirates are unacceptable for Xpert Xpress SARS-CoV-2/FLU/RSV testing.  Fact Sheet for Patients: BloggerCourse.com  Fact Sheet for Healthcare  Providers: SeriousBroker.it  This test is not yet approved or cleared by the [place]  FDA and has been authorized for detection and/or diagnosis of SARS-CoV-2 by FDA under an Emergency Use Authorization (EUA). This EUA will remain in effect (meaning this test can be used) for the duration of the COVID-19 declaration under Section 564(b)(1) of the Act, 21 U.S.C. section 360bbb-3(b)(1), unless the authorization is terminated or revoked.     Resp Syncytial Virus by PCR NEGATIVE NEGATIVE Final    Comment: (NOTE) Fact Sheet for Patients: BloggerCourse.com  Fact Sheet for Healthcare Providers: SeriousBroker.it  This test is not yet approved or cleared by the [place]  FDA and has been authorized for detection and/or diagnosis of SARS-CoV-2 by FDA under an Emergency Use Authorization (EUA). This EUA will remain in effect (meaning this test can be used) for the duration of the COVID-19 declaration under Section 564(b)(1) of the Act, 21 U.S.C. section 360bbb-3(b)(1), unless the authorization is terminated or revoked.  Performed at [facility], [address] [place], [place] [address]   Respiratory (~20 pathogens) panel by PCR     Status: None   Collection Time: 10/05/23  8:11 AM   Specimen: Nasopharyngeal Swab; Respiratory  Result Value Ref Range Status   Adenovirus NOT DETECTED NOT DETECTED Final   Coronavirus 229E NOT DETECTED NOT DETECTED Final    Comment: (NOTE) The Coronavirus on the Respiratory Panel, DOES NOT test for the novel  Coronavirus (2019 nCoV)    Coronavirus HKU1 NOT DETECTED NOT DETECTED Final   Coronavirus NL63 NOT DETECTED NOT DETECTED Final   Coronavirus OC43 NOT DETECTED NOT DETECTED Final   Metapneumovirus NOT DETECTED NOT DETECTED Final   Rhinovirus / Enterovirus NOT DETECTED NOT DETECTED Final   Influenza A NOT DETECTED NOT DETECTED Final   Influenza B NOT  DETECTED NOT DETECTED Final   Parainfluenza Virus 1 NOT DETECTED NOT DETECTED Final   Parainfluenza Virus 2 NOT DETECTED NOT DETECTED Final   Parainfluenza Virus 3 NOT DETECTED NOT DETECTED Final   Parainfluenza Virus 4 NOT DETECTED NOT DETECTED Final   Respiratory Syncytial Virus NOT DETECTED NOT DETECTED Final   Bordetella pertussis NOT DETECTED NOT DETECTED Final   Bordetella Parapertussis NOT DETECTED NOT DETECTED Final   Chlamydophila pneumoniae NOT DETECTED NOT DETECTED Final   Mycoplasma pneumoniae NOT DETECTED NOT DETECTED Final    Comment: Performed at [facility], [address], [place], [place] [address]  Expectorated Sputum Assessment w Gram Stain, Rflx to Resp Cult     Status: None   Collection Time: 10/05/23  9:07 AM   Specimen: Sputum  Result Value Ref Range Status   Specimen Description SPUTUM  Final   Special Requests NONE  Final   Sputum evaluation   Final    Sputum specimen not acceptable for testing.  Please recollect.   C/KERRY NELSON AT 1005 10/05/23.PMF Performed at [facility], [address] [place], [place] [address]    Report Status 10/05/2023 FINAL  Final  Expectorated Sputum Assessment w Gram Stain, Rflx to Resp Cult     Status: None   Collection Time: 10/05/23 10:50 AM  Result Value Ref Range Status   Specimen Description EXPECTORATED SPUTUM  Final   Special Requests NONE  Final   Sputum evaluation   Final    THIS SPECIMEN IS ACCEPTABLE FOR SPUTUM CULTURE Performed at [facility], [address], [place], [place] [address]    Report Status 10/05/2023 FINAL  Final  Culture, Respiratory w Gram Stain     Status: None   Collection Time: 10/05/23 10:50 AM  Result Value Ref Range Status   Specimen Description   Final    EXPECTORATED SPUTUM Performed at [facility], [address], [place], [place] [address]    Special Requests   Final    NONE Reflexed from [phone] Performed at [facility], [address] [place], [place] [address]    Gram Stain   Final    RARE WBC SEEN RARE VONNE POSITIVE RODS RARE VONNE POSITIVE COCCI RARE GRAM NEGATIVE RODS    Culture   Final    FEW Normal respiratory flora-no Staph aureus or Pseudomonas seen Performed at [facility], [address], [place], [place] [address]    Report Status 10/07/2023 FINAL  Final  MRSA Next Gen by PCR, Nasal     Status: None   Collection Time: 10/06/23 12:57 AM   Specimen: Nasal Mucosa; Nasal Swab  Result  Value Ref Range Status   MRSA by PCR Next Gen NOT DETECTED NOT DETECTED Final    Comment: (NOTE) The GeneXpert MRSA Assay (FDA approved for NASAL specimens only), is one component of a comprehensive MRSA colonization surveillance program. It is not intended to diagnose MRSA infection nor to guide or monitor treatment for MRSA infections. Test performance is not FDA approved in patients less than 22 years old. Performed at [facility], [address] [place], [place] [address]   Culture, Respiratory w Gram Stain     Status: None   Collection Time: 10/06/23 11:37 AM   Specimen: INDUCED SPUTUM  Result Value Ref Range Status   Specimen Description   Final    INDUCED SPUTUM Performed at [facility], [address] [place], [place] [address]    Special Requests   Final    NONE Performed at [facility], [address] [place], [place] [address]    Gram Stain   Final    FEW WBC PRESENT,BOTH PMN AND MONONUCLEAR FEW GRAM POSITIVE RODS    Culture   Final    MODERATE LACTOBACILLUS FERMENTUM Standardized susceptibility testing for this organism is not available. Performed at [facility], [address], [place], [place] [address]    Report Status 10/09/2023 FINAL  Final  Culture, blood (x 2)     Status: None   Collection Time: 10/22/23  1:00 AM   Specimen: BLOOD  Result Value Ref Range Status   Specimen Description BLOOD BLOOD RIGHT ARM  Final   Special Requests    Final    BOTTLES DRAWN AEROBIC AND ANAEROBIC Blood Culture adequate volume   Culture   Final    NO GROWTH 5 DAYS Performed at [facility], [address] [place], [place] [address]    Report Status 10/27/2023 FINAL  Final  Culture, blood (x 2)     Status: None   Collection Time: 10/22/23  1:00 AM   Specimen: BLOOD  Result Value Ref Range Status   Specimen Description BLOOD BLOOD RIGHT ARM  Final   Special Requests   Final    BOTTLES DRAWN AEROBIC AND ANAEROBIC Blood Culture adequate volume   Culture   Final    NO GROWTH 5 DAYS Performed at [facility], [address] [place], [place] [address]    Report Status 10/27/2023 FINAL  Final  Resp panel by RT-PCR (RSV, Flu A&B, Covid) Anterior Nasal Swab     Status: None   Collection Time: 11/09/23  5:31 PM   Specimen: Anterior Nasal Swab  Result Value Ref Range Status   SARS Coronavirus 2 by RT PCR NEGATIVE NEGATIVE Final    Comment: (NOTE) SARS-CoV-2 target nucleic acids are NOT DETECTED.  The SARS-CoV-2 RNA is generally detectable in upper respiratory specimens during the acute phase of infection. The lowest concentration of SARS-CoV-2 viral copies this assay can detect is 138 copies/mL. A negative result does not preclude SARS-Cov-2 infection and should not be used as the sole basis for treatment or other patient management decisions. A negative result may occur with  improper specimen collection/handling, submission of specimen other than nasopharyngeal swab, presence of viral mutation(s) within the areas targeted by this assay, and inadequate number of viral copies(<138 copies/mL). A negative result must be combined with clinical observations, patient history, and epidemiological information. The expected result is Negative.  Fact Sheet for Patients:  BloggerCourse.com  Fact Sheet for Healthcare Providers:  SeriousBroker.it  This test  is no t yet  approved or cleared by the [place]  FDA and  has been authorized for detection and/or diagnosis of SARS-CoV-2 by FDA under an Emergency Use Authorization (EUA). This EUA will remain  in effect (meaning this test can be used) for the duration of the COVID-19 declaration under Section 564(b)(1) of the Act, 21 U.S.C.section 360bbb-3(b)(1), unless the authorization is terminated  or revoked sooner.       Influenza A by PCR NEGATIVE NEGATIVE Final   Influenza B by PCR NEGATIVE NEGATIVE Final    Comment: (NOTE) The Xpert Xpress SARS-CoV-2/FLU/RSV plus assay is intended as an aid in the diagnosis of influenza from Nasopharyngeal swab specimens and should not be used as a sole basis for treatment. Nasal washings and aspirates are unacceptable for Xpert Xpress SARS-CoV-2/FLU/RSV testing.  Fact Sheet for Patients: BloggerCourse.com  Fact Sheet for Healthcare Providers: SeriousBroker.it  This test is not yet approved or cleared by the [place]  FDA and has been authorized for detection and/or diagnosis of SARS-CoV-2 by FDA under an Emergency Use Authorization (EUA). This EUA will remain in effect (meaning this test can be used) for the duration of the COVID-19 declaration under Section 564(b)(1) of the Act, 21 U.S.C. section 360bbb-3(b)(1), unless the authorization is terminated or revoked.     Resp Syncytial Virus by PCR NEGATIVE NEGATIVE Final    Comment: (NOTE) Fact Sheet for Patients: BloggerCourse.com  Fact Sheet for Healthcare Providers: SeriousBroker.it  This test is not yet approved or cleared by the [place]  FDA and has been authorized for detection and/or diagnosis of SARS-CoV-2 by FDA under an Emergency Use Authorization (EUA). This EUA will remain in effect (meaning this test can be used) for the duration of the COVID-19 declaration under Section 564(b)(1) of  the Act, 21 U.S.C. section 360bbb-3(b)(1), unless the authorization is terminated or revoked.  Performed at [facility], [address] [place], [place] [address]   Respiratory (~20 pathogens) panel by PCR     Status: None   Collection Time: 11/09/23  5:31 PM   Specimen: Nasopharyngeal Swab; Respiratory  Result Value Ref Range Status   Adenovirus NOT DETECTED NOT DETECTED Final   Coronavirus 229E NOT DETECTED NOT DETECTED Final    Comment: (NOTE) The Coronavirus on the Respiratory Panel, DOES NOT test for the novel  Coronavirus (2019 nCoV)    Coronavirus HKU1 NOT DETECTED NOT DETECTED Final   Coronavirus NL63 NOT DETECTED NOT DETECTED Final   Coronavirus OC43 NOT DETECTED NOT DETECTED Final   Metapneumovirus NOT DETECTED NOT DETECTED Final   Rhinovirus / Enterovirus NOT DETECTED NOT DETECTED Final   Influenza A NOT DETECTED NOT DETECTED Final   Influenza B NOT DETECTED NOT DETECTED Final   Parainfluenza Virus 1 NOT DETECTED NOT DETECTED Final   Parainfluenza Virus 2 NOT DETECTED NOT DETECTED Final   Parainfluenza Virus 3 NOT DETECTED NOT DETECTED Final   Parainfluenza Virus 4 NOT DETECTED NOT DETECTED Final   Respiratory Syncytial Virus NOT DETECTED NOT DETECTED Final   Bordetella pertussis NOT DETECTED NOT DETECTED Final   Bordetella Parapertussis NOT DETECTED NOT DETECTED Final   Chlamydophila pneumoniae NOT DETECTED NOT DETECTED Final   Mycoplasma pneumoniae NOT DETECTED NOT DETECTED Final    Comment: Performed at [facility], [address] [place], [place] [address]  Culture, blood (Routine X 2) w Reflex to ID Panel     Status: None   Collection Time: 11/09/23  6:16 PM   Specimen: BLOOD  Result Value Ref Range Status   Specimen Description   Final    BLOOD RIGHT ANTECUBITAL Performed at [facility] [address] [place], [place] [address]    Special Requests   Final    BOTTLES DRAWN AEROBIC ONLY Blood Culture adequate volume Performed  at [facility], [address] [place], [place] [address]    Culture   Final    NO GROWTH 11 DAYS Performed at [facility], [address], [place], [place] [address]    Report Status 11/20/2023 FINAL  Final  Culture, blood (Routine X 2) w Reflex to ID Panel     Status: None   Collection Time: 11/09/23  6:23 PM   Specimen: BLOOD  Result Value Ref Range Status   Specimen Description   Final    BLOOD BLOOD LEFT FOREARM Performed at [facility], [address], [place], [place] [address]    Special Requests   Final    BOTTLES DRAWN AEROBIC AND ANAEROBIC Blood Culture adequate volume Performed at [facility], [address] [place], [place] [address]    Culture   Final    NO GROWTH 11 DAYS Performed at [facility], [address] [place], [place] [address]    Report Status 11/20/2023 FINAL  Final  Urine Culture     Status: Abnormal   Collection Time: 11/10/23  1:58 AM   Specimen: Urine, Random  Result Value Ref Range Status   Specimen Description   Final    URINE, RANDOM Performed at [facility], [address] [place], [place] [address]    Special Requests   Final    NONE Reflexed from [id number] Performed at [facility], [address] [place], [place] [address]    Culture 60,000 COLONIES/mL ENTEROCOCCUS FAECALIS (A)  Final   Report Status 11/12/2023 FINAL  Final   Organism ID, Bacteria ENTEROCOCCUS FAECALIS (A)  Final      Susceptibility   Enterococcus faecalis - MIC*    AMPICILLIN  <=2 SENSITIVE Sensitive     NITROFURANTOIN <=16 SENSITIVE Sensitive     VANCOMYCIN  1 SENSITIVE Sensitive     * 60,000 COLONIES/mL ENTEROCOCCUS FAECALIS  Culture, blood (x 2)     Status: None   Collection Time: 11/25/23 10:31 PM   Specimen: BLOOD  Result Value Ref Range Status   Specimen Description BLOOD BLOOD LEFT ARM  Final   Special Requests   Final    BOTTLES DRAWN AEROBIC AND ANAEROBIC Blood Culture adequate volume    Culture   Final    NO GROWTH 5 DAYS Performed at [facility], [address] [place], [place] [address]    Report Status 11/30/2023 FINAL  Final  Culture, blood (x 2)     Status: None   Collection Time: 11/25/23 10:31 PM   Specimen: BLOOD  Result Value Ref Range Status   Specimen Description BLOOD BLOOD RIGHT ARM  Final   Special Requests   Final    BOTTLES DRAWN AEROBIC AND ANAEROBIC Blood Culture adequate volume   Culture   Final    NO GROWTH 5 DAYS Performed at [facility], [address] [place], [place] [address]    Report Status 11/30/2023 FINAL  Final  Gastrointestinal Panel by PCR , Stool     Status: None   Collection Time: 11/30/23  5:10 PM   Specimen: Stool  Result Value Ref Range Status   Campylobacter species NOT DETECTED NOT  DETECTED Final   Plesimonas shigelloides NOT DETECTED NOT DETECTED Final   Salmonella species NOT DETECTED NOT DETECTED Final   Yersinia enterocolitica NOT DETECTED NOT DETECTED Final   Vibrio species NOT DETECTED NOT DETECTED Final   Vibrio cholerae NOT DETECTED NOT DETECTED Final   Enteroaggregative E coli (EAEC) NOT DETECTED NOT DETECTED Final   Enteropathogenic E coli (EPEC) NOT DETECTED NOT DETECTED Final   Enterotoxigenic E coli (ETEC) NOT DETECTED NOT DETECTED Final   Shiga like toxin producing E coli (STEC) NOT DETECTED NOT DETECTED Final   Shigella/Enteroinvasive E coli (EIEC) NOT DETECTED NOT DETECTED Final   Cryptosporidium NOT DETECTED NOT DETECTED Final   Cyclospora cayetanensis NOT DETECTED NOT DETECTED Final   Entamoeba histolytica NOT DETECTED NOT DETECTED Final   Giardia lamblia NOT DETECTED NOT DETECTED Final   Adenovirus F40/41 NOT DETECTED NOT DETECTED Final   Astrovirus NOT DETECTED NOT DETECTED Final   Norovirus GI/GII NOT DETECTED NOT DETECTED Final   Rotavirus A NOT DETECTED NOT DETECTED Final   Sapovirus (I, II, IV, and V) NOT DETECTED NOT DETECTED Final    Comment: Performed at [facility], [address] [place], [place] [address]    Coagulation Studies: No results for input(s): LABPROT, INR in the last 72 hours.   Urinalysis: No results for input(s): COLORURINE, LABSPEC, PHURINE, GLUCOSEU, HGBUR, BILIRUBINUR, KETONESUR, PROTEINUR, UROBILINOGEN, NITRITE, LEUKOCYTESUR in the last 72 hours.  Invalid input(s): APPERANCEUR     Imaging: No results found.        Medications:    albumin  human 25 g (12/04/23 1630)    sodium chloride    Intravenous Once   Chlorhexidine  Gluconate Cloth  6 each Topical Q0600   epoetin  alfa-epbx (RETACRIT ) injection  10,000 Units Intravenous Q M,W,F-HD   feeding supplement (NEPRO CARB STEADY)  237 mL Oral 5 X Daily   feeding supplement (PROSource TF20)  60 mL Per Tube Daily   ferrous sulfate   325 mg Per Tube Daily   fiber supplement (BANATROL TF)  60 mL Per Tube BID   free water   120 mL Per Tube 5 X Daily   gentamicin  ointment   Topical TID   heparin  injection (subcutaneous)  5,000 Units Subcutaneous Q8H   insulin  aspart  10 Units Subcutaneous Once   midodrine   5 mg Per Tube TID WC   multivitamin  1 tablet Per Tube QHS   pantoprazole  (PROTONIX ) IV  40 mg Intravenous Q12H   acetaminophen  **OR** acetaminophen , albumin  human, artificial tears, bisacodyl , ipratropium-albuterol , [DISCONTINUED] ondansetron  **OR** ondansetron  (ZOFRAN ) IV, mouth rinse  Assessment/ Plan:  Mr. Robert Vega is a 83 y.o.  male with obstructive sleep apnea, GERD, obesity, hypertension, dysphagia, severe esophageal dysmotility with achalasia status post PEG tube placement, recent aspiration pneumonia acute respiratory failure status post extubation 10/09/2023, who was admitted to [facility] on 10/05/2023 for Aspiration into respiratory tract, initial encounter [T17.908A] Severe sepsis (HCC) [A41.9, R65.20] History of dysphagia [Z87.898] Fever, unspecified fever cause [R50.9] Multifocal pneumonia [J18.9]   1.  Acute kidney  injury. Requiring hemodialysis. With obstructive uropathy Baseline creatinine 0.81 from 10/14/2023.  Acute kidney injury secondary to ATN, obstructive uropathy, and progression of prerenal azotemia.   Patient is critically ill.  He was started on urgent hemodialysis for severe hyperkalemia and uremia. Vascular surgery placed PermCath on 6/26.  - TOC dialysis coordinator notified of need for outpatient dialysis clinic.  Search remains in progress. - Patient receiving treatment today, UF 1L as tolerated.  - Next treatment scheduled  for Saturday - Accepted at [facility] on a TTS schedule, chair time 7:10am.    2.  Sepsis and hypotension due to aspiration pneumonia, found to have distal esophageal obstruction with severe dysmotility.  He is status post botulinum toxin injection to the lower esophageal sphincter.  PEG tube placed this admission. Receiving continuous tube feeds with scheduled free water  flushes.  - Continue midodrine   4.Anemia in the setting of renal failure Lab Results  Component Value Date   HGB 7.4 (L) 12/06/2023  - Continue IV EPO with HD treatments.  -Hemoglobin 7.4  5.  Bilateral hydronephrosis Noted on CT.Urology team has evaluated the patient and it is thought to be secondary to reflux.  Due to health, further intervention deferred at this time.     LOS: 62 Lavarr President 7/11/202510:50 AM

## 2023-12-06 NOTE — Progress Notes (Signed)
 Mobility Specialist - Progress Note   12/06/23 1549  Mobility  Activity Transferred from chair to bed;Turned to left side;Turned to back - supine;Turned to stomach - prone  Level of Assistance Total care  Assistive Device Sling  Mobility visit 1 Mobility  Mobility Specialist Start Time (ACUTE ONLY) 1506  Mobility Specialist Stop Time (ACUTE ONLY) 1529  Mobility Specialist Time Calculation (min) (ACUTE ONLY) 23 min     Pt sitting in recliner upon arrival, utilizing 2L. Pt assisted back to bed with hoyer lift transfer. TotalA to roll L/R. Once in bed, pt assisted with peri-care d/t BM. Pt left in bed with alarm set, needs in reach. On phone with granddaughter at exit.    Lennette Seip Mobility Specialist 12/06/23, 3:53 PM

## 2023-12-06 NOTE — Progress Notes (Signed)
 Palliative Care Progress Note, Assessment & Plan   Patient Name: Robert Vega       Date: 12/06/2023 DOB: 11-08-1940  Age: 83 y.o. MRN#: [id number] Attending Physician: Trudy Anthony HERO, MD Primary Care Physician: Jimmy Charlie FERNS, MD Admit Date: 10/05/2023  Subjective: Patient is lying in bed in hemodialysis bay 4.  He is awake and alert, acknowledges my presence.  He makes no effort to make vocalizations or engage in discussions with me.  Nonverbal signs of distress or pain not noted.  HPI: 83 y.o. male  with past medical history of dysphagia (s/p EGD 03/2022 with food impaction in upper esophagus-suffered aspiration and cardiac arrest--concern for lack or peristalsis, OSA and HTN admitted from home on 10/05/2023 with hypoxia, fever and generalized weakness.   Today marks 62 days of inpatient hospitalization.  PMT was consulted 5/11, clarified goals with patient and family at that time, and then signed off given goals were clear.  However, PMT was reconsulted today 7/11, again to support patient and family's goals of care discussions.  Summary of counseling/coordination of care: Extensive chart review completed prior to meeting patient including labs, vital signs, imaging, progress notes, orders, and available advanced directive documents from current and previous encounters.   After reviewing the patient's chart and assessing the patient at bedside, I spoke with patient's granddaughter/HCPOA in regards to symptom management and goals of care.   Extensive discussion of patient's course of hospitalization up until this point.  Therapeutic silence, active listening, and emotional support provided.  Goals remain clear with full code and full scope.    From a palliative perspective, patient's  granddaughter is requesting support with collaboration of care.  She would like for patient to go to [facility] with a MWF HD schedule.  She shares that transportation is not available from [facility] on a MWF basis. However, she believes that nephrologist Dr. Douglas is working to make this a reality.  DIscussed patient's clinical course and plan with NP Breeze. Confirmed that patient has been accepted for outpatient hemodialysis with their clinic.  However, clinic scheduled dictates when the patient can come for HD.  TTS schedule is all available to patient currently.  After clarification with nephrology and attending, I attempted to speak with patient's granddaughter over the phone again.  No answer.  HIPAA compliant voicemail with PMT contact info left.  Goals are clear.  Barrier to discharge is granddaughters wishes for [facility] on the MWF HD schedule.  TOC following closely for discharge planning.  Full code and full scope remain.  Patient's granddaughter is unwavering wishes to continue for patient to accept all offered, available, and appropriate medical interventions to sustain his life.  PMT will step back from daily visits and monitor the patient peripherally.  Please re-engage with PMT if goals change, at patient/family's request, or if patient's health deteriorates during hospitalization.    Physical Exam Vitals reviewed.  Constitutional:      Comments: Thin, frail  HENT:     Head: Normocephalic.     Mouth/Throat:     Mouth: Mucous membranes are moist.  Eyes:     Pupils: Pupils are equal, round, and reactive to light.  Pulmonary:  Effort: Pulmonary effort is normal.  Skin:    Coloration: Skin is pale.  Neurological:     Mental Status: He is alert.  Psychiatric:        Mood and Affect: Mood normal.        Behavior: Behavior normal.             Total Time 50 minutes   Time spent includes: Detailed review of medical records (labs, imaging, vital  signs), medically appropriate exam (mental status, respiratory, cardiac, skin), discussed with treatment team, counseling and educating patient, family and staff, documenting clinical information, medication management and coordination of care.  [name], DNP, FNP-BC Palliative Medicine Team

## 2023-12-06 NOTE — Progress Notes (Addendum)
 SLP Cancellation Note  Patient Details Name: Robert Vega MRN: [id number] DOB: March 23, 1941   Cancelled treatment:       Reason Eval/Treat Not Completed: Medical issues which prohibited therapy;Fatigue/lethargy limiting ability to participate;Patient not medically ready;Patient's level of consciousness (chart reviewed)  Per request from MD, ST services attempted to make an assessment of pt's current presentation in setting of his Medical issues (ongoing). Per MD notes:  Delirium: vs mild cognitive impairment vs anoxic brain injury during critical illness in the ICU- requiring intubation & pressors. Often lethargic. Pt would not answer any questions today, mental status waxes and wanes..   Pt is Not appropriate for a cognitive assessment at this time. Suspect pt may not be appropriate for such assessment while in an Acute State of illness and during setting of such a LENGTHY stay/illness (62d) in a hospital, especially at his age w/ comorbidities. His full BASELINE Cognitive functioning is unknown being that he lived Alone(?).    ST services has recommended pt to have f/u for cognitive-communication assessment/tx at his next venue of care for any cognitive-linguistic issues/needs- once he is in a structured/routine setting and his medical issues have Much improved allowing him to be able to Actively participate in therapy. Assessment when medical issues have improved (significantly) and when in a structured setting would Best indicate his deficits/needs but Not when in an Acute State of illness as he is in currently. IF there is concern for Dementia(cognitive decline), then Neurology would have to be consulted to make that determination. Also, IF an Anoxic Event is suspected, then Neurology would need to be consulted to assess. Palliative Care consult recommended. MD and TOC have been updated.      Comer Portugal, MS, CCC-SLP Speech Language Pathologist Rehab Services; [facility] [phone] (ascom) Alicianna Litchford 12/06/2023, 9:08 AM

## 2023-12-06 NOTE — Progress Notes (Signed)
 PROGRESS NOTE    Robert Vega  FMW:[id number] DOB: Nov 18, 1940 DOA: 10/05/2023 PCP: Jimmy Charlie FERNS, MD    Assessment & Plan:   Principal Problem:   Severe sepsis [facility]) Active Problems:   Gastrostomy complication (HCC)   Dysphagia   Achalasia of esophagus   AKI (acute kidney injury) (HCC)   Acute delirium   Acute respiratory failure with hypoxia and hypercapnia (HCC)   Essential hypertension, benign   GERD (gastroesophageal reflux disease)   Encounter for dialysis and dialysis catheter care [facility])   Multifocal pneumonia   History of dysphagia   Aspiration pneumonia (HCC)   Obesity (BMI 30-39.9)   Generalized weakness   Generalized abdominal pain   Malnutrition of moderate degree   Melena   Leukocytosis   Perinephric hematoma  Assessment and Plan:  Failure to thrive: secondary to all below. Continue w/ full code, full scope of care as per pt's granddaughter. Poor prognosis. Palliative care recs apprec    SIRS: started on night on 11/25/23. W/ leukocytosis, tachypnea, fever and unknown source. Started on IV broad sprectrum abxs, IV flagyl , vanco, cefepime  initially. Will complete IV zosyn  after today's doses as per ID. Unlikely to have acute infection as per ID.    AKI: secondary to ATN as per nephro. On HD. Nephro following and recs apprec   Left perinephric hematoma: w/ b/l hydronephrosis & hydroureter. May get b/l ureteral stents on Monday as per uro. No stone. Etiology unclear. No intervention for hematoma as per uro.    Hyperkalemia: resolved   Possible ileus: resolved.    ACD: likely secondary to CKD. S/p 2 units of pRBCs transfused so far. Continue on iron supplemental. H&H are labile. Will transfuse if Hb < 7.0     Achalasia & dysphagia: w/ high aspiration risk. S/p botulinum toxin injection on 5/20, several EGD and SLP evals. Not safe for PO intake unless family wants to accept risk that he will aspirate again. Continue w/ tube feeds    Severe sepsis:  resolved. See Dr. Lamount note on how pt met severe sepsis criteria. Initially w/ aspiration pna. Later in hospital stay w/ intra-abdominal infection secondary to PEG dislodged. Resolved    Acute hypoxic & hypercapnic respiratory failure: s/p intubation, ventilation & extubation. Continue on supplemental oxygen and wean as tolerated.    Chronic pEF CHF: XR shows interstitial edema/CHF.  Fluid/volume management w/ HD    Delirium: vs mild cognitive impairment vs anoxic brain injury during critical illness in the ICU, requiring intubation & pressors.  Pt still would not answer any of my questions today, mental status waxes and wanes. Continue w/ supportive care.  Granddaughter asked to d/c prn antipsychotics.   GERD: continue on PPI    HTN: BP is WNL. Continue on midodrine      Generalized weakness: PT/OT recs SNF   Obesity: BMI 32.8. Would benefit from weight loss   Moderate malnutrition: continue w/ tube feeds    DVT prophylaxis: heparin   Code Status: full Family Communication: Disposition Plan: possibly d/c to SNF  Level of care: Med-Surg  Status is: Inpatient Remains inpatient appropriate because: severity of illness    Consultants:  ID ICU Nephro Uro   Procedures:   Antimicrobials: zosyn     Subjective: Pt still cannot answer any of my questions today  Objective: Vitals:   12/05/23 1519 12/05/23 2030 12/06/23 0521 12/06/23 0830  BP: (!) 119/49 126/60 124/68 128/63  Pulse: 75 78 78 75  Resp: 16 20 18 19   Temp: 98.3  F (36.8 C) 98.6 F (37 C) 98.7 F (37.1 C) 98.7 F (37.1 C)  TempSrc:  Oral Oral Oral  SpO2: 98% 100% 91% 98%  Weight:      Height:        Intake/Output Summary (Last 24 hours) at 12/06/2023 9147 Last data filed at 12/05/2023 2100 Gross per 24 hour  Intake 0 ml  Output --  Net 0 ml    Filed Weights   11/29/23 0830 11/29/23 1230 12/02/23 1726  Weight: 96.8 kg 95.3 kg 94.3 kg    Examination:  General exam: appears confused   Respiratory system: diminished breath sounds b/l  Gastrointestinal system: abd is soft, NT, obese & hypoactive bowel sounds  Central nervous system: Lethargic. Moves all extremities  Psychiatry: Judgement and insight appears poor    Data Reviewed: I have personally reviewed following labs and imaging studies  CBC: Recent Labs  Lab 12/01/23 1006 12/02/23 1040 12/03/23 0959 12/05/23 0522 12/06/23 0415  WBC 18.7* 18.4* 18.8* 17.1* 15.7*  NEUTROABS  --   --  13.8*  --   --   HGB 8.1* 7.6* 7.6* 7.3* 7.4*  HCT 26.6* 24.8* 24.6* 23.6* 24.1*  MCV 94.0 92.9 93.2 91.1 92.0  PLT 463* 513* 468* 451* 482*   Basic Metabolic Panel: Recent Labs  Lab 12/01/23 1006 12/02/23 1040 12/03/23 0959 12/05/23 0522 12/06/23 0415  NA 137 138 136 132* 134*  K 4.2 3.6 3.7 3.4* 3.6  CL 98 98 97* 93* 93*  CO2 24 22 26 26 26   GLUCOSE 121* 140* 117* 124* 120*  BUN 83* 96* 65* 44* 63*  CREATININE 5.92* 7.56* 5.21* 4.47* 5.96*  CALCIUM  8.0* 7.9* 7.8* 8.0* 7.7*  PHOS  --   --  5.7*  --   --    GFR: Estimated Creatinine Clearance: 10.5 mL/min (A) (by C-G formula based on SCr of 5.96 mg/dL (H)). Liver Function Tests: Recent Labs  Lab 12/03/23 0959  ALBUMIN  1.8*   No results for input(s): LIPASE, AMYLASE in the last 168 hours. No results for input(s): AMMONIA in the last 168 hours. Coagulation Profile: No results for input(s): INR, PROTIME in the last 168 hours. Cardiac Enzymes: No results for input(s): CKTOTAL, CKMB, CKMBINDEX, TROPONINI in the last 168 hours. BNP (last 3 results) No results for input(s): PROBNP in the last 8760 hours. HbA1C: No results for input(s): HGBA1C in the last 72 hours. CBG: Recent Labs  Lab 12/04/23 2242 12/05/23 0720 12/05/23 1133 12/05/23 1616 12/06/23 0618  GLUCAP 97 119* 129* 120* 115*   Lipid Profile: No results for input(s): CHOL, HDL, LDLCALC, TRIG, CHOLHDL, LDLDIRECT in the last 72 hours. Thyroid  Function  Tests: No results for input(s): TSH, T4TOTAL, FREET4, T3FREE, THYROIDAB in the last 72 hours. Anemia Panel: No results for input(s): VITAMINB12, FOLATE, FERRITIN, TIBC, IRON, RETICCTPCT in the last 72 hours. Sepsis Labs: No results for input(s): PROCALCITON, LATICACIDVEN in the last 168 hours.  Recent Results (from the past 240 hours)  Gastrointestinal Panel by PCR , Stool     Status: None   Collection Time: 11/30/23  5:10 PM   Specimen: Stool  Result Value Ref Range Status   Campylobacter species NOT DETECTED NOT DETECTED Final   Plesimonas shigelloides NOT DETECTED NOT DETECTED Final   Salmonella species NOT DETECTED NOT DETECTED Final   Yersinia enterocolitica NOT DETECTED NOT DETECTED Final   Vibrio species NOT DETECTED NOT DETECTED Final   Vibrio cholerae NOT DETECTED NOT DETECTED Final   Enteroaggregative E coli (  EAEC) NOT DETECTED NOT DETECTED Final   Enteropathogenic E coli (EPEC) NOT DETECTED NOT DETECTED Final   Enterotoxigenic E coli (ETEC) NOT DETECTED NOT DETECTED Final   Shiga like toxin producing E coli (STEC) NOT DETECTED NOT DETECTED Final   Shigella/Enteroinvasive E coli (EIEC) NOT DETECTED NOT DETECTED Final   Cryptosporidium NOT DETECTED NOT DETECTED Final   Cyclospora cayetanensis NOT DETECTED NOT DETECTED Final   Entamoeba histolytica NOT DETECTED NOT DETECTED Final   Giardia lamblia NOT DETECTED NOT DETECTED Final   Adenovirus F40/41 NOT DETECTED NOT DETECTED Final   Astrovirus NOT DETECTED NOT DETECTED Final   Norovirus GI/GII NOT DETECTED NOT DETECTED Final   Rotavirus A NOT DETECTED NOT DETECTED Final   Sapovirus (I, II, IV, and V) NOT DETECTED NOT DETECTED Final    Comment: Performed at [facility], [address] [place], [place] [address]         Radiology Studies: No results found.       Scheduled Meds:  sodium chloride    Intravenous Once   Chlorhexidine  Gluconate Cloth  6 each Topical Q0600    epoetin  alfa-epbx (RETACRIT ) injection  10,000 Units Intravenous Q M,W,F-HD   feeding supplement (NEPRO CARB STEADY)  237 mL Oral 5 X Daily   feeding supplement (PROSource TF20)  60 mL Per Tube Daily   ferrous sulfate   325 mg Per Tube Daily   fiber supplement (BANATROL TF)  60 mL Per Tube BID   free water   120 mL Per Tube 5 X Daily   gentamicin  ointment   Topical TID   heparin  injection (subcutaneous)  5,000 Units Subcutaneous Q8H   insulin  aspart  10 Units Subcutaneous Once   midodrine   5 mg Per Tube TID WC   multivitamin  1 tablet Per Tube QHS   pantoprazole  (PROTONIX ) IV  40 mg Intravenous Q12H   Continuous Infusions:  albumin  human 25 g (12/04/23 1630)     LOS: 62 days      Anthony CHRISTELLA Pouch, MD [facility] Pager [phone]  If 7PM-7AM, please contact night-coverage [email or web address] 12/06/2023, 8:52 AM

## 2023-12-06 NOTE — Progress Notes (Signed)
 Date of Admission:  10/05/2023      ID: Robert Vega is a 83 y.o. male Principal Problem:   Severe sepsis (HCC) Active Problems:   Encounter for dialysis and dialysis catheter care (HCC)   GERD (gastroesophageal reflux disease)   Dysphagia   Essential hypertension, benign   Multifocal pneumonia   Acute respiratory failure with hypoxia and hypercapnia (HCC)   History of dysphagia   Aspiration pneumonia (HCC)   Achalasia of esophagus   Acute delirium   Obesity (BMI 30-39.9)   Generalized weakness   Gastrostomy complication (HCC)   AKI (acute kidney injury) (HCC)   Generalized abdominal pain   Malnutrition of moderate degree   Melena   Leukocytosis   Perinephric hematoma   5/10 admission for weakness, hypoxia- aspiration pneumonia due to achlasia  5/10 CT b/l infitrates- intubated- extubated 5/14 Dilated esophagus 5/13 PEG instertion by GI 5/20 repeat EGD botulinum injection 5/25 pulled PEG out 5/27 exp lap for dislodged peg in the abdominal cavity 6/72melena 6/10 EGD 6/16 HD cath- dialysis started 6/26 permanent HD cath  Antibiotic therapy Ceftriaxone  5/10-5/14 Azithro 5/10-5/11 Metronidazole  5/10 Zosyn  5/26-5/31 Cefepime  6/30-7/3 Zosyn  7/3 >>    Subjective: None available Back from dialysis   Medications:   sodium chloride    Intravenous Once   Chlorhexidine  Gluconate Cloth  6 each Topical Q0600   epoetin  alfa-epbx (RETACRIT ) injection  10,000 Units Intravenous Q M,W,F-HD   feeding supplement (NEPRO CARB STEADY)  237 mL Oral 5 X Daily   feeding supplement (PROSource TF20)  60 mL Per Tube Daily   ferrous sulfate   325 mg Per Tube Daily   fiber supplement (BANATROL TF)  60 mL Per Tube BID   free water   120 mL Per Tube 5 X Daily   gentamicin  ointment   Topical TID   heparin  injection (subcutaneous)  5,000 Units Subcutaneous Q8H   insulin  aspart  10 Units Subcutaneous Once   midodrine   5 mg Per Tube TID WC   multivitamin  1 tablet Per Tube QHS    pantoprazole  (PROTONIX ) IV  40 mg Intravenous Q12H    Objective: Vital signs in last 24 hours: Patient Vitals for the past 24 hrs:  BP Temp Temp src Pulse Resp SpO2  12/06/23 1000 125/62 -- -- (!) 53 (!) 22 95 %  12/06/23 0930 135/72 -- -- 72 (!) 27 99 %  12/06/23 0900 138/67 -- -- 74 (!) 22 100 %  12/06/23 0849 134/68 -- -- 74 (!) 23 100 %  12/06/23 0830 128/63 98.7 F (37.1 C) Oral 75 19 98 %  12/06/23 0521 124/68 98.7 F (37.1 C) Oral 78 18 91 %  12/05/23 2030 126/60 98.6 F (37 C) Oral 78 20 100 %  12/05/23 1519 (!) 119/49 98.3 F (36.8 C) -- 75 16 98 %  12/05/23 1515 (!) 120/38 98.3 F (36.8 C) -- (!) 52 16 96 %     LDA HD cath PHYSICAL EXAM:  General: somnolent, Lungs: b/l air entry Crepts bases Heart: Regular rate and rhythm, no murmur, rub or gallop. Abdomen: Soft, distended. Bowel sounds normal. No masses Extremities: atraumatic, no cyanosis. No edema. No clubbing Skin: No rashes or lesions. Or bruising CNS unable to assess Lab Results    Latest Ref Rng & Units 12/06/2023    4:15 AM 12/05/2023    5:22 AM 12/03/2023    9:59 AM  CBC  WBC 4.0 - 10.5 K/uL 15.7  17.1  18.8   Hemoglobin 13.0 -  17.0 g/dL 7.4  7.3  7.6   Hematocrit 39.0 - 52.0 % 24.1  23.6  24.6   Platelets 150 - 400 K/uL 482  451  468        Latest Ref Rng & Units 12/06/2023    4:15 AM 12/05/2023    5:22 AM 12/03/2023    9:59 AM  CMP  Glucose 70 - 99 mg/dL 879  875  882   BUN 8 - 23 mg/dL 63  44  65   Creatinine 0.61 - 1.24 mg/dL 4.03  5.52  4.78   Sodium 135 - 145 mmol/L 134  132  136   Potassium 3.5 - 5.1 mmol/L 3.6  3.4  3.7   Chloride 98 - 111 mmol/L 93  93  97   CO2 22 - 32 mmol/L 26  26  26    Calcium  8.9 - 10.3 mg/dL 7.7  8.0  7.8       Microbiology: 5/10 BC NG 5/27 BC NG 6/14 BC- NG 6/30 BC NG Studies/Results:   No results found.    Assessment/Plan:  Prolonged and Complicated hospital stay.  Aspiration pneumonia Hypoxia - intubated and then extubated Achalasia, dilated  esophagus para esophageal hernia  PEG placement Dislodgement and peritonitis- exp lap on 5/27  Leucocytosis- multifactorial- reactive to hydronephrosis, perinephric stranding - possible infection perinephric hematoma, aspiration pneumonia,  Been on antibiotics sine 6/30 - zosyn  started on 7/3 -improvement in leucocytosis- continue for a total of 10-14 days  B/l hydroureteronephrosis- unclear etiology- bladder wall thickening. Did he have BOO No calculus CT scan repeated showed proteinaceous hemorrhagic material in kidneys- Will he need perc nephrostomy?  Left perinephric hematoma ? etiology Urology consuted  AKI since 5/26 now on dialysis  Discussed the management with the grand daughter   ID will not see him this weekend- call if needed

## 2023-12-07 DIAGNOSIS — R627 Adult failure to thrive: Secondary | ICD-10-CM | POA: Diagnosis not present

## 2023-12-07 LAB — CBC
HCT: 24.9 % — ABNORMAL LOW (ref 39.0–52.0)
Hemoglobin: 7.4 g/dL — ABNORMAL LOW (ref 13.0–17.0)
MCH: 27.6 pg (ref 26.0–34.0)
MCHC: 29.7 g/dL — ABNORMAL LOW (ref 30.0–36.0)
MCV: 92.9 fL (ref 80.0–100.0)
Platelets: 454 K/uL — ABNORMAL HIGH (ref 150–400)
RBC: 2.68 MIL/uL — ABNORMAL LOW (ref 4.22–5.81)
RDW: 15.8 % — ABNORMAL HIGH (ref 11.5–15.5)
WBC: 13.1 K/uL — ABNORMAL HIGH (ref 4.0–10.5)
nRBC: 0 % (ref 0.0–0.2)

## 2023-12-07 LAB — GLUCOSE, CAPILLARY
Glucose-Capillary: 118 mg/dL — ABNORMAL HIGH (ref 70–99)
Glucose-Capillary: 125 mg/dL — ABNORMAL HIGH (ref 70–99)
Glucose-Capillary: 121 mg/dL — ABNORMAL HIGH (ref 70–99)

## 2023-12-07 LAB — BASIC METABOLIC PANEL WITH GFR
Anion gap: 10 (ref 5–15)
BUN: 41 mg/dL — ABNORMAL HIGH (ref 8–23)
CO2: 28 mmol/L (ref 22–32)
Calcium: 7.9 mg/dL — ABNORMAL LOW (ref 8.9–10.3)
Chloride: 95 mmol/L — ABNORMAL LOW (ref 98–111)
Creatinine, Ser: 4.19 mg/dL — ABNORMAL HIGH (ref 0.61–1.24)
GFR, Estimated: 13 mL/min — ABNORMAL LOW (ref >60)
Glucose, Bld: 122 mg/dL — ABNORMAL HIGH (ref 70–99)
Potassium: 3.4 mmol/L — ABNORMAL LOW (ref 3.5–5.1)
Sodium: 133 mmol/L — ABNORMAL LOW (ref 135–145)

## 2023-12-07 NOTE — Plan of Care (Signed)

## 2023-12-07 NOTE — Progress Notes (Signed)
 PROGRESS NOTE    Robert Vega  FMW:[id number] DOB: May 09, 1941 DOA: 10/05/2023 PCP: Jimmy Charlie FERNS, MD    Assessment & Plan:   Principal Problem:   Severe sepsis [facility]) Active Problems:   Gastrostomy complication (HCC)   Dysphagia   Achalasia of esophagus   AKI (acute kidney injury) (HCC)   Acute delirium   Acute respiratory failure with hypoxia and hypercapnia (HCC)   Essential hypertension, benign   GERD (gastroesophageal reflux disease)   Encounter for dialysis and dialysis catheter care [facility])   Multifocal pneumonia   History of dysphagia   Aspiration pneumonia (HCC)   Obesity (BMI 30-39.9)   Generalized weakness   Generalized abdominal pain   Malnutrition of moderate degree   Melena   Leukocytosis   Perinephric hematoma   Hydronephrosis  Assessment and Plan:  Failure to thrive: secondary to all below. Continue w/ full code, full scope of care as per pt's granddaughter. Poor prognosis. Palliative care recs apprec    SIRS: started on night on 11/25/23. W/ leukocytosis, tachypnea, fever and unknown source. Started on IV broad sprectrum abxs, IV flagyl , vanco, cefepime  initially. Will continue on IV zosyn  now for 10-14 days as per ID.    AKI: secondary to ATN as per nephro. On HD. Can start outpatient HD on 12/12/23. Nephro following and recs apprec   Left perinephric hematoma: w/ b/l hydronephrosis & hydroureter. May get b/l ureteral stents on Monday as per uro. No stone. Etiology unclear. No intervention for hematoma as per uro.    Hyperkalemia: resolved   Possible ileus: resolved.    ACD: likely secondary to CKD. S/p 2 units of pRBCs transfused so far. Continue on iron supplemental. H&H are stable today. Will transfuse if Hb < 7.0     Achalasia & dysphagia: w/ high aspiration risk. S/p botulinum toxin injection on 5/20, several EGD and SLP evals. Not safe for PO intake unless family wants to accept risk that he will aspirate again. Continue w/ tube feeds    Severe sepsis: resolved. See Dr. Lamount note on how pt met severe sepsis criteria. Initially w/ aspiration pna. Later in hospital stay w/ intra-abdominal infection secondary to PEG dislodged. Resolved    Acute hypoxic & hypercapnic respiratory failure: s/p intubation, ventilation & extubation. Continue on supplemental oxygen and wean as tolerated    Chronic pEF CHF: XR shows interstitial edema/CHF.  Fluid/volume management w/ HD    Delirium: vs mild cognitive impairment vs anoxic brain injury during critical illness in the ICU, requiring intubation & pressors.  Pt still did not answer any of my questions today, mental status waxes & wanes. Continue w/ supportive care. Granddaughter asked to d/c prn antipsychotics.   GERD: continue on PPI     HTN: BP is WNL. Continue on midodrine      Generalized weakness: PT/OT recs SNF   Obesity: BMI 32.8. Would benefit from weight loss   Moderate malnutrition: continue w/ tube feeds   DVT prophylaxis: heparin   Code Status: full Family Communication: Disposition Plan: possibly d/c to SNF  Level of care: Med-Surg  Status is: Inpatient Remains inpatient appropriate because: severity of illness. Prognosis is poor. No clinical improvement     Consultants:  ID ICU Nephro Uro   Procedures:   Antimicrobials: zosyn     Subjective: Pt did answer any of my questions today   Objective: Vitals:   12/06/23 2008 12/06/23 2332 12/07/23 0307 12/07/23 0823  BP: (!) 128/58 (!) 122/58 119/60 (!) 119/57  Pulse: 75 70  80 79  Resp: 20 20 20 14   Temp: 98.5 F (36.9 C) 98.6 F (37 C) 97.9 F (36.6 C) (!) 97.4 F (36.3 C)  TempSrc: Oral Oral Oral   SpO2: 97% 99% 98% 96%  Weight:      Height:        Intake/Output Summary (Last 24 hours) at 12/07/2023 0841 Last data filed at 12/06/2023 1222 Gross per 24 hour  Intake --  Output 1000 ml  Net -1000 ml    Filed Weights   11/29/23 0830 11/29/23 1230 12/02/23 1726  Weight: 96.8 kg 95.3 kg  94.3 kg    Examination:  General exam: appears confused  Respiratory system: decreased breath sounds b/l  Gastrointestinal system: abd is soft, obese & hypoactive bowel sounds  Central nervous system: Lethargic.  Psychiatry: judgement and insight appears poor    Data Reviewed: I have personally reviewed following labs and imaging studies  CBC: Recent Labs  Lab 12/02/23 1040 12/03/23 0959 12/05/23 0522 12/06/23 0415 12/07/23 0640  WBC 18.4* 18.8* 17.1* 15.7* 13.1*  NEUTROABS  --  13.8*  --   --   --   HGB 7.6* 7.6* 7.3* 7.4* 7.4*  HCT 24.8* 24.6* 23.6* 24.1* 24.9*  MCV 92.9 93.2 91.1 92.0 92.9  PLT 513* 468* 451* 482* 454*   Basic Metabolic Panel: Recent Labs  Lab 12/02/23 1040 12/03/23 0959 12/05/23 0522 12/06/23 0415 12/07/23 0640  NA 138 136 132* 134* 133*  K 3.6 3.7 3.4* 3.6 3.4*  CL 98 97* 93* 93* 95*  CO2 22 26 26 26 28   GLUCOSE 140* 117* 124* 120* 122*  BUN 96* 65* 44* 63* 41*  CREATININE 7.56* 5.21* 4.47* 5.96* 4.19*  CALCIUM  7.9* 7.8* 8.0* 7.7* 7.9*  PHOS  --  5.7*  --   --   --    GFR: Estimated Creatinine Clearance: 14.9 mL/min (A) (by C-G formula based on SCr of 4.19 mg/dL (H)). Liver Function Tests: Recent Labs  Lab 12/03/23 0959  ALBUMIN  1.8*   No results for input(s): LIPASE, AMYLASE in the last 168 hours. No results for input(s): AMMONIA in the last 168 hours. Coagulation Profile: No results for input(s): INR, PROTIME in the last 168 hours. Cardiac Enzymes: No results for input(s): CKTOTAL, CKMB, CKMBINDEX, TROPONINI in the last 168 hours. BNP (last 3 results) No results for input(s): PROBNP in the last 8760 hours. HbA1C: No results for input(s): HGBA1C in the last 72 hours. CBG: Recent Labs  Lab 12/06/23 0618 12/06/23 1320 12/06/23 1636 12/06/23 2043 12/07/23 0824  GLUCAP 115* 104* 135* 103* 118*   Lipid Profile: No results for input(s): CHOL, HDL, LDLCALC, TRIG, CHOLHDL, LDLDIRECT in the  last 72 hours. Thyroid  Function Tests: No results for input(s): TSH, T4TOTAL, FREET4, T3FREE, THYROIDAB in the last 72 hours. Anemia Panel: No results for input(s): VITAMINB12, FOLATE, FERRITIN, TIBC, IRON, RETICCTPCT in the last 72 hours. Sepsis Labs: No results for input(s): PROCALCITON, LATICACIDVEN in the last 168 hours.  Recent Results (from the past 240 hours)  Gastrointestinal Panel by PCR , Stool     Status: None   Collection Time: 11/30/23  5:10 PM   Specimen: Stool  Result Value Ref Range Status   Campylobacter species NOT DETECTED NOT DETECTED Final   Plesimonas shigelloides NOT DETECTED NOT DETECTED Final   Salmonella species NOT DETECTED NOT DETECTED Final   Yersinia enterocolitica NOT DETECTED NOT DETECTED Final   Vibrio species NOT DETECTED NOT DETECTED Final   Vibrio cholerae NOT DETECTED  NOT DETECTED Final   Enteroaggregative E coli (EAEC) NOT DETECTED NOT DETECTED Final   Enteropathogenic E coli (EPEC) NOT DETECTED NOT DETECTED Final   Enterotoxigenic E coli (ETEC) NOT DETECTED NOT DETECTED Final   Shiga like toxin producing E coli (STEC) NOT DETECTED NOT DETECTED Final   Shigella/Enteroinvasive E coli (EIEC) NOT DETECTED NOT DETECTED Final   Cryptosporidium NOT DETECTED NOT DETECTED Final   Cyclospora cayetanensis NOT DETECTED NOT DETECTED Final   Entamoeba histolytica NOT DETECTED NOT DETECTED Final   Giardia lamblia NOT DETECTED NOT DETECTED Final   Adenovirus F40/41 NOT DETECTED NOT DETECTED Final   Astrovirus NOT DETECTED NOT DETECTED Final   Norovirus GI/GII NOT DETECTED NOT DETECTED Final   Rotavirus A NOT DETECTED NOT DETECTED Final   Sapovirus (I, II, IV, and V) NOT DETECTED NOT DETECTED Final    Comment: Performed at [facility], [address], [place], [place] [address]         Radiology Studies: No results found.       Scheduled Meds:  sodium chloride    Intravenous Once   acidophilus  1 capsule Oral  Daily   Chlorhexidine  Gluconate Cloth  6 each Topical Q0600   epoetin  alfa-epbx (RETACRIT ) injection  10,000 Units Intravenous Q M,W,F-HD   feeding supplement (NEPRO CARB STEADY)  237 mL Oral 5 X Daily   feeding supplement (PROSource TF20)  60 mL Per Tube Daily   ferrous sulfate   325 mg Per Tube Daily   fiber supplement (BANATROL TF)  60 mL Per Tube BID   free water   120 mL Per Tube 5 X Daily   gentamicin  ointment   Topical TID   heparin  injection (subcutaneous)  5,000 Units Subcutaneous Q8H   insulin  aspart  10 Units Subcutaneous Once   midodrine   5 mg Per Tube TID WC   multivitamin  1 tablet Per Tube QHS   pantoprazole  (PROTONIX ) IV  40 mg Intravenous Q12H   Continuous Infusions:  albumin  human 25 g (12/04/23 1630)   piperacillin -tazobactam (ZOSYN )  IV 2.25 g (12/07/23 0545)     LOS: 63 days      Anthony CHRISTELLA Pouch, MD [facility] Pager [phone]  If 7PM-7AM, please contact night-coverage [email or web address] 12/07/2023, 8:41 AM

## 2023-12-07 NOTE — Progress Notes (Signed)
 [facility]  ROUNDING NOTE   Subjective:  Patient seen in bed with eyes closed. No changes from previous day. Patient does not respond to commands.   Objective:  Vital signs in last 24 hours:  Temp:  [97.4 F (36.3 C)-98.6 F (37 C)] 98 F (36.7 C) (07/12 1554) Pulse Rate:  [70-80] 79 (07/12 1554) Resp:  [14-20] 16 (07/12 1554) BP: (119-139)/(57-74) 139/74 (07/12 1554) SpO2:  [96 %-99 %] 97 % (07/12 1554)  Weight change:  Filed Weights   11/29/23 0830 11/29/23 1230 12/02/23 1726  Weight: 96.8 kg 95.3 kg 94.3 kg    Intake/Output: I/O last 3 completed shifts: In: 0  Out: 1000 [Other:1000]   Intake/Output this shift:  No intake/output data recorded.  Physical Exam: General:  Chronically ill appearing, laying in bed  Head: Normocephalic  Eyes: Anicteric  Neck: Supple  Lungs:  Diminished to auscultation  Heart: Regular rate and rhythm  Abdomen:  Soft, nontender,   Extremities:  No peripheral edema.  Neurologic: Does not respond to commands  Skin: cool  Access: Rt chest permcath    Basic Metabolic Panel: Recent Labs  Lab 12/02/23 1040 12/03/23 0959 12/05/23 0522 12/06/23 0415 12/07/23 0640  NA 138 136 132* 134* 133*  K 3.6 3.7 3.4* 3.6 3.4*  CL 98 97* 93* 93* 95*  CO2 22 26 26 26 28   GLUCOSE 140* 117* 124* 120* 122*  BUN 96* 65* 44* 63* 41*  CREATININE 7.56* 5.21* 4.47* 5.96* 4.19*  CALCIUM  7.9* 7.8* 8.0* 7.7* 7.9*  PHOS  --  5.7*  --   --   --     Liver Function Tests: Recent Labs  Lab 12/03/23 0959  ALBUMIN  1.8*   No results for input(s): LIPASE, AMYLASE in the last 168 hours. No results for input(s): AMMONIA in the last 168 hours.  CBC: Recent Labs  Lab 12/02/23 1040 12/03/23 0959 12/05/23 0522 12/06/23 0415 12/07/23 0640  WBC 18.4* 18.8* 17.1* 15.7* 13.1*  NEUTROABS  --  13.8*  --   --   --   HGB 7.6* 7.6* 7.3* 7.4* 7.4*  HCT 24.8* 24.6* 23.6* 24.1* 24.9*  MCV 92.9 93.2 91.1 92.0 92.9  PLT 513* 468* 451* 482*  454*    Cardiac Enzymes: No results for input(s): CKTOTAL, CKMB, CKMBINDEX, TROPONINI in the last 168 hours.  BNP: Invalid input(s): POCBNP  CBG: Recent Labs  Lab 12/06/23 1636 12/06/23 2043 12/07/23 0824 12/07/23 1146 12/07/23 1555  GLUCAP 135* 103* 118* 125* 121*    Microbiology: Results for orders placed or performed during the hospital encounter of 10/05/23  Blood Culture (routine x 2)     Status: None   Collection Time: 10/05/23  5:06 AM   Specimen: BLOOD  Result Value Ref Range Status   Specimen Description BLOOD LA  Final   Special Requests   Final    BOTTLES DRAWN AEROBIC AND ANAEROBIC Blood Culture results may not be optimal due to an inadequate volume of blood received in culture bottles   Culture   Final    NO GROWTH 5 DAYS Performed at [facility], [address] [place], [place] [address]    Report Status 10/10/2023 FINAL  Final  Blood Culture (routine x 2)     Status: None   Collection Time: 10/05/23  5:07 AM   Specimen: BLOOD  Result Value Ref Range Status   Specimen Description BLOOD RA  Final   Special Requests   Final    BOTTLES DRAWN  AEROBIC AND ANAEROBIC Blood Culture results may not be optimal due to an inadequate volume of blood received in culture bottles   Culture   Final    NO GROWTH 5 DAYS Performed at [facility], [address] [place], [place] [address]    Report Status 10/10/2023 FINAL  Final  Resp panel by RT-PCR (RSV, Flu A&B, Covid) Anterior Nasal Swab     Status: None   Collection Time: 10/05/23  5:56 AM   Specimen: Anterior Nasal Swab  Result Value Ref Range Status   SARS Coronavirus 2 by RT PCR NEGATIVE NEGATIVE Final    Comment: (NOTE) SARS-CoV-2 target nucleic acids are NOT DETECTED.  The SARS-CoV-2 RNA is generally detectable in upper respiratory specimens during the acute phase of infection. The lowest concentration of SARS-CoV-2 viral copies this assay can detect is 138 copies/mL. A  negative result does not preclude SARS-Cov-2 infection and should not be used as the sole basis for treatment or other patient management decisions. A negative result may occur with  improper specimen collection/handling, submission of specimen other than nasopharyngeal swab, presence of viral mutation(s) within the areas targeted by this assay, and inadequate number of viral copies(<138 copies/mL). A negative result must be combined with clinical observations, patient history, and epidemiological information. The expected result is Negative.  Fact Sheet for Patients:  BloggerCourse.com  Fact Sheet for Healthcare Providers:  SeriousBroker.it  This test is no t yet approved or cleared by the [place]  FDA and  has been authorized for detection and/or diagnosis of SARS-CoV-2 by FDA under an Emergency Use Authorization (EUA). This EUA will remain  in effect (meaning this test can be used) for the duration of the COVID-19 declaration under Section 564(b)(1) of the Act, 21 U.S.C.section 360bbb-3(b)(1), unless the authorization is terminated  or revoked sooner.       Influenza A by PCR NEGATIVE NEGATIVE Final   Influenza B by PCR NEGATIVE NEGATIVE Final    Comment: (NOTE) The Xpert Xpress SARS-CoV-2/FLU/RSV plus assay is intended as an aid in the diagnosis of influenza from Nasopharyngeal swab specimens and should not be used as a sole basis for treatment. Nasal washings and aspirates are unacceptable for Xpert Xpress SARS-CoV-2/FLU/RSV testing.  Fact Sheet for Patients: BloggerCourse.com  Fact Sheet for Healthcare Providers: SeriousBroker.it  This test is not yet approved or cleared by the [place]  FDA and has been authorized for detection and/or diagnosis of SARS-CoV-2 by FDA under an Emergency Use Authorization (EUA). This EUA will remain in effect (meaning this test can  be used) for the duration of the COVID-19 declaration under Section 564(b)(1) of the Act, 21 U.S.C. section 360bbb-3(b)(1), unless the authorization is terminated or revoked.     Resp Syncytial Virus by PCR NEGATIVE NEGATIVE Final    Comment: (NOTE) Fact Sheet for Patients: BloggerCourse.com  Fact Sheet for Healthcare Providers: SeriousBroker.it  This test is not yet approved or cleared by the [place]  FDA and has been authorized for detection and/or diagnosis of SARS-CoV-2 by FDA under an Emergency Use Authorization (EUA). This EUA will remain in effect (meaning this test can be used) for the duration of the COVID-19 declaration under Section 564(b)(1) of the Act, 21 U.S.C. section 360bbb-3(b)(1), unless the authorization is terminated or revoked.  Performed at [facility], [address] [place], [place] [address]   Respiratory (~20 pathogens) panel by PCR     Status: None   Collection Time: 10/05/23  8:11 AM   Specimen: Nasopharyngeal  Swab; Respiratory  Result Value Ref Range Status   Adenovirus NOT DETECTED NOT DETECTED Final   Coronavirus 229E NOT DETECTED NOT DETECTED Final    Comment: (NOTE) The Coronavirus on the Respiratory Panel, DOES NOT test for the novel  Coronavirus (2019 nCoV)    Coronavirus HKU1 NOT DETECTED NOT DETECTED Final   Coronavirus NL63 NOT DETECTED NOT DETECTED Final   Coronavirus OC43 NOT DETECTED NOT DETECTED Final   Metapneumovirus NOT DETECTED NOT DETECTED Final   Rhinovirus / Enterovirus NOT DETECTED NOT DETECTED Final   Influenza A NOT DETECTED NOT DETECTED Final   Influenza B NOT DETECTED NOT DETECTED Final   Parainfluenza Virus 1 NOT DETECTED NOT DETECTED Final   Parainfluenza Virus 2 NOT DETECTED NOT DETECTED Final   Parainfluenza Virus 3 NOT DETECTED NOT DETECTED Final   Parainfluenza Virus 4 NOT DETECTED NOT DETECTED Final   Respiratory Syncytial Virus NOT DETECTED NOT  DETECTED Final   Bordetella pertussis NOT DETECTED NOT DETECTED Final   Bordetella Parapertussis NOT DETECTED NOT DETECTED Final   Chlamydophila pneumoniae NOT DETECTED NOT DETECTED Final   Mycoplasma pneumoniae NOT DETECTED NOT DETECTED Final    Comment: Performed at [facility], [address] [place], [place] [address]  Expectorated Sputum Assessment w Gram Stain, Rflx to Resp Cult     Status: None   Collection Time: 10/05/23  9:07 AM   Specimen: Sputum  Result Value Ref Range Status   Specimen Description SPUTUM  Final   Special Requests NONE  Final   Sputum evaluation   Final    Sputum specimen not acceptable for testing.  Please recollect.   C/KERRY NELSON AT 1005 10/05/23.PMF Performed at [facility], [address] [place], [place] [address]    Report Status 10/05/2023 FINAL  Final  Expectorated Sputum Assessment w Gram Stain, Rflx to Resp Cult     Status: None   Collection Time: 10/05/23 10:50 AM  Result Value Ref Range Status   Specimen Description EXPECTORATED SPUTUM  Final   Special Requests NONE  Final   Sputum evaluation   Final    THIS SPECIMEN IS ACCEPTABLE FOR SPUTUM CULTURE Performed at [facility], [address], [place], [place] [address]    Report Status 10/05/2023 FINAL  Final  Culture, Respiratory w Gram Stain     Status: None   Collection Time: 10/05/23 10:50 AM  Result Value Ref Range Status   Specimen Description   Final    EXPECTORATED SPUTUM Performed at [facility], [address], [place], [place] [address]    Special Requests   Final    NONE Reflexed from [id number] Performed at [facility], [address] [place], [place] [address]    Gram Stain   Final    RARE WBC SEEN RARE VONNE POSITIVE RODS RARE VONNE POSITIVE COCCI RARE GRAM NEGATIVE RODS    Culture   Final    FEW Normal respiratory flora-no Staph aureus or Pseudomonas seen Performed at [facility], [address],  [place], [place] [address]    Report Status 10/07/2023 FINAL  Final  MRSA Next Gen by PCR, Nasal     Status: None   Collection Time: 10/06/23 12:57 AM   Specimen: Nasal Mucosa; Nasal Swab  Result Value Ref Range Status   MRSA by PCR Next Gen NOT DETECTED NOT DETECTED Final    Comment: (NOTE) The GeneXpert MRSA Assay (FDA approved for NASAL specimens only), is one component  of a comprehensive MRSA colonization surveillance program. It is not intended to diagnose MRSA infection nor to guide or monitor treatment for MRSA infections. Test performance is not FDA approved in patients less than 50 years old. Performed at [facility], [address] [place], [place] [address]   Culture, Respiratory w Gram Stain     Status: None   Collection Time: 10/06/23 11:37 AM   Specimen: INDUCED SPUTUM  Result Value Ref Range Status   Specimen Description   Final    INDUCED SPUTUM Performed at [facility], [address], [place], [place] [address]    Special Requests   Final    NONE Performed at [facility], [address] [place], [place] [address]    Gram Stain   Final    FEW WBC PRESENT,BOTH PMN AND MONONUCLEAR FEW GRAM POSITIVE RODS    Culture   Final    MODERATE LACTOBACILLUS FERMENTUM Standardized susceptibility testing for this organism is not available. Performed at [facility], [address] [place], [place] [address]    Report Status 10/09/2023 FINAL  Final  Culture, blood (x 2)     Status: None   Collection Time: 10/22/23  1:00 AM   Specimen: BLOOD  Result Value Ref Range Status   Specimen Description BLOOD BLOOD RIGHT ARM  Final   Special Requests   Final    BOTTLES DRAWN AEROBIC AND ANAEROBIC Blood Culture adequate volume   Culture   Final    NO GROWTH 5 DAYS Performed at [facility], [address], [place], [place] [address]    Report Status 10/27/2023 FINAL  Final  Culture, blood (x 2)     Status: None   Collection  Time: 10/22/23  1:00 AM   Specimen: BLOOD  Result Value Ref Range Status   Specimen Description BLOOD BLOOD RIGHT ARM  Final   Special Requests   Final    BOTTLES DRAWN AEROBIC AND ANAEROBIC Blood Culture adequate volume   Culture   Final    NO GROWTH 5 DAYS Performed at [facility], [address] [place], [place] [address]    Report Status 10/27/2023 FINAL  Final  Resp panel by RT-PCR (RSV, Flu A&B, Covid) Anterior Nasal Swab     Status: None   Collection Time: 11/09/23  5:31 PM   Specimen: Anterior Nasal Swab  Result Value Ref Range Status   SARS Coronavirus 2 by RT PCR NEGATIVE NEGATIVE Final    Comment: (NOTE) SARS-CoV-2 target nucleic acids are NOT DETECTED.  The SARS-CoV-2 RNA is generally detectable in upper respiratory specimens during the acute phase of infection. The lowest concentration of SARS-CoV-2 viral copies this assay can detect is 138 copies/mL. A negative result does not preclude SARS-Cov-2 infection and should not be used as the sole basis for treatment or other patient management decisions. A negative result may occur with  improper specimen collection/handling, submission of specimen other than nasopharyngeal swab, presence of viral mutation(s) within the areas targeted by this assay, and inadequate number of viral copies(<138 copies/mL). A negative result must be combined with clinical observations, patient history, and epidemiological information. The expected result is Negative.  Fact Sheet for Patients:  BloggerCourse.com  Fact Sheet for Healthcare Providers:  SeriousBroker.it  This test is no t yet approved or cleared by the [place]  FDA and  has been authorized for detection and/or diagnosis of SARS-CoV-2 by FDA under an Emergency Use Authorization (EUA). This EUA will  remain  in effect (meaning this test can be used) for the duration of the COVID-19 declaration under Section  564(b)(1) of the Act, 21 U.S.C.section 360bbb-3(b)(1), unless the authorization is terminated  or revoked sooner.       Influenza A by PCR NEGATIVE NEGATIVE Final   Influenza B by PCR NEGATIVE NEGATIVE Final    Comment: (NOTE) The Xpert Xpress SARS-CoV-2/FLU/RSV plus assay is intended as an aid in the diagnosis of influenza from Nasopharyngeal swab specimens and should not be used as a sole basis for treatment. Nasal washings and aspirates are unacceptable for Xpert Xpress SARS-CoV-2/FLU/RSV testing.  Fact Sheet for Patients: BloggerCourse.com  Fact Sheet for Healthcare Providers: SeriousBroker.it  This test is not yet approved or cleared by the [place]  FDA and has been authorized for detection and/or diagnosis of SARS-CoV-2 by FDA under an Emergency Use Authorization (EUA). This EUA will remain in effect (meaning this test can be used) for the duration of the COVID-19 declaration under Section 564(b)(1) of the Act, 21 U.S.C. section 360bbb-3(b)(1), unless the authorization is terminated or revoked.     Resp Syncytial Virus by PCR NEGATIVE NEGATIVE Final    Comment: (NOTE) Fact Sheet for Patients: BloggerCourse.com  Fact Sheet for Healthcare Providers: SeriousBroker.it  This test is not yet approved or cleared by the [place]  FDA and has been authorized for detection and/or diagnosis of SARS-CoV-2 by FDA under an Emergency Use Authorization (EUA). This EUA will remain in effect (meaning this test can be used) for the duration of the COVID-19 declaration under Section 564(b)(1) of the Act, 21 U.S.C. section 360bbb-3(b)(1), unless the authorization is terminated or revoked.  Performed at [facility], [address] [place], [place] [address]   Respiratory (~20 pathogens) panel by PCR     Status: None   Collection Time: 11/09/23  5:31 PM   Specimen:  Nasopharyngeal Swab; Respiratory  Result Value Ref Range Status   Adenovirus NOT DETECTED NOT DETECTED Final   Coronavirus 229E NOT DETECTED NOT DETECTED Final    Comment: (NOTE) The Coronavirus on the Respiratory Panel, DOES NOT test for the novel  Coronavirus (2019 nCoV)    Coronavirus HKU1 NOT DETECTED NOT DETECTED Final   Coronavirus NL63 NOT DETECTED NOT DETECTED Final   Coronavirus OC43 NOT DETECTED NOT DETECTED Final   Metapneumovirus NOT DETECTED NOT DETECTED Final   Rhinovirus / Enterovirus NOT DETECTED NOT DETECTED Final   Influenza A NOT DETECTED NOT DETECTED Final   Influenza B NOT DETECTED NOT DETECTED Final   Parainfluenza Virus 1 NOT DETECTED NOT DETECTED Final   Parainfluenza Virus 2 NOT DETECTED NOT DETECTED Final   Parainfluenza Virus 3 NOT DETECTED NOT DETECTED Final   Parainfluenza Virus 4 NOT DETECTED NOT DETECTED Final   Respiratory Syncytial Virus NOT DETECTED NOT DETECTED Final   Bordetella pertussis NOT DETECTED NOT DETECTED Final   Bordetella Parapertussis NOT DETECTED NOT DETECTED Final   Chlamydophila pneumoniae NOT DETECTED NOT DETECTED Final   Mycoplasma pneumoniae NOT DETECTED NOT DETECTED Final    Comment: Performed at [facility], [address] [place], [place] [address]  Culture, blood (Routine X 2) w Reflex to ID Panel     Status: None   Collection Time: 11/09/23  6:16 PM   Specimen: BLOOD  Result Value Ref Range Status   Specimen Description   Final    BLOOD RIGHT ANTECUBITAL Performed at [facility], [address], [place], [place] [address]    Special  Requests   Final    BOTTLES DRAWN AEROBIC ONLY Blood Culture adequate volume Performed at [facility], [address], [place], [place] [address]    Culture   Final    NO GROWTH 11 DAYS Performed at [facility], [address], [place], [place] [address]    Report Status 11/20/2023 FINAL  Final  Culture, blood (Routine X 2) w Reflex to ID Panel      Status: None   Collection Time: 11/09/23  6:23 PM   Specimen: BLOOD  Result Value Ref Range Status   Specimen Description   Final    BLOOD BLOOD LEFT FOREARM Performed at [facility], [address], [place], [place] [address]    Special Requests   Final    BOTTLES DRAWN AEROBIC AND ANAEROBIC Blood Culture adequate volume Performed at [facility], [address] [place], [place] [address]    Culture   Final    NO GROWTH 11 DAYS Performed at [facility], [address], [place], [place] [address]    Report Status 11/20/2023 FINAL  Final  Urine Culture     Status: Abnormal   Collection Time: 11/10/23  1:58 AM   Specimen: Urine, Random  Result Value Ref Range Status   Specimen Description   Final    URINE, RANDOM Performed at [facility], [address] [place], [place] [address]    Special Requests   Final    NONE Reflexed from [id number] Performed at [facility], [address] [place], [place] [address]    Culture 60,000 COLONIES/mL ENTEROCOCCUS FAECALIS (A)  Final   Report Status 11/12/2023 FINAL  Final   Organism ID, Bacteria ENTEROCOCCUS FAECALIS (A)  Final      Susceptibility   Enterococcus faecalis - MIC*    AMPICILLIN  <=2 SENSITIVE Sensitive     NITROFURANTOIN <=16 SENSITIVE Sensitive     VANCOMYCIN  1 SENSITIVE Sensitive     * 60,000 COLONIES/mL ENTEROCOCCUS FAECALIS  Culture, blood (x 2)     Status: None   Collection Time: 11/25/23 10:31 PM   Specimen: BLOOD  Result Value Ref Range Status   Specimen Description BLOOD BLOOD LEFT ARM  Final   Special Requests   Final    BOTTLES DRAWN AEROBIC AND ANAEROBIC Blood Culture adequate volume   Culture   Final    NO GROWTH 5 DAYS Performed at [facility], [address] [place], [place] [address]    Report Status 11/30/2023 FINAL  Final  Culture, blood (x 2)     Status: None   Collection Time: 11/25/23 10:31 PM   Specimen: BLOOD  Result Value Ref Range  Status   Specimen Description BLOOD BLOOD RIGHT ARM  Final   Special Requests   Final    BOTTLES DRAWN AEROBIC AND ANAEROBIC Blood Culture adequate volume   Culture   Final    NO GROWTH 5 DAYS Performed at [facility], [address] [place], [place] [address]    Report Status 11/30/2023 FINAL  Final  Gastrointestinal Panel by PCR , Stool     Status: None   Collection Time: 11/30/23  5:10 PM   Specimen: Stool  Result Value Ref Range Status   Campylobacter species NOT DETECTED NOT DETECTED Final   Plesimonas shigelloides NOT DETECTED NOT DETECTED Final   Salmonella species NOT DETECTED NOT DETECTED Final   Yersinia enterocolitica NOT DETECTED NOT DETECTED Final   Vibrio species NOT  DETECTED NOT DETECTED Final   Vibrio cholerae NOT DETECTED NOT DETECTED Final   Enteroaggregative E coli (EAEC) NOT DETECTED NOT DETECTED Final   Enteropathogenic E coli (EPEC) NOT DETECTED NOT DETECTED Final   Enterotoxigenic E coli (ETEC) NOT DETECTED NOT DETECTED Final   Shiga like toxin producing E coli (STEC) NOT DETECTED NOT DETECTED Final   Shigella/Enteroinvasive E coli (EIEC) NOT DETECTED NOT DETECTED Final   Cryptosporidium NOT DETECTED NOT DETECTED Final   Cyclospora cayetanensis NOT DETECTED NOT DETECTED Final   Entamoeba histolytica NOT DETECTED NOT DETECTED Final   Giardia lamblia NOT DETECTED NOT DETECTED Final   Adenovirus F40/41 NOT DETECTED NOT DETECTED Final   Astrovirus NOT DETECTED NOT DETECTED Final   Norovirus GI/GII NOT DETECTED NOT DETECTED Final   Rotavirus A NOT DETECTED NOT DETECTED Final   Sapovirus (I, II, IV, and V) NOT DETECTED NOT DETECTED Final    Comment: Performed at [facility], [address] [place], [place] [address]    Coagulation Studies: No results for input(s): LABPROT, INR in the last 72 hours.  Urinalysis: No results for input(s): COLORURINE, LABSPEC, PHURINE, GLUCOSEU, HGBUR, BILIRUBINUR, KETONESUR, PROTEINUR,  UROBILINOGEN, NITRITE, LEUKOCYTESUR in the last 72 hours.  Invalid input(s): APPERANCEUR    Imaging: No results found.   Medications:    albumin  human 25 g (12/04/23 1630)   piperacillin -tazobactam (ZOSYN )  IV 2.25 g (12/07/23 1459)    sodium chloride    Intravenous Once   acidophilus  1 capsule Oral Daily   Chlorhexidine  Gluconate Cloth  6 each Topical Q0600   epoetin  alfa-epbx (RETACRIT ) injection  10,000 Units Intravenous Q M,W,F-HD   feeding supplement (NEPRO CARB STEADY)  237 mL Oral 5 X Daily   feeding supplement (PROSource TF20)  60 mL Per Tube Daily   ferrous sulfate   325 mg Per Tube Daily   fiber supplement (BANATROL TF)  60 mL Per Tube BID   free water   120 mL Per Tube 5 X Daily   gentamicin  ointment   Topical TID   heparin  injection (subcutaneous)  5,000 Units Subcutaneous Q8H   insulin  aspart  10 Units Subcutaneous Once   midodrine   5 mg Per Tube TID WC   multivitamin  1 tablet Per Tube QHS   pantoprazole  (PROTONIX ) IV  40 mg Intravenous Q12H   acetaminophen  **OR** acetaminophen , albumin  human, artificial tears, bisacodyl , ipratropium-albuterol , [DISCONTINUED] ondansetron  **OR** ondansetron  (ZOFRAN ) IV, mouth rinse  Assessment/ Plan:  Mr. Robert Vega is a 83 y.o.  male  with obstructive sleep apnea, GERD, obesity, hypertension, dysphagia, severe esophageal dysmotility with achalasia status post PEG tube placement, recent aspiration pneumonia acute respiratory failure status post extubation 10/09/2023, who was admitted to [facility] on 10/05/2023 for Aspiration into respiratory tract, initial encounter [T17.908A] Severe sepsis (HCC) [A41.9, R65.20] History of dysphagia [Z87.898] Fever, unspecified fever cause [R50.9] Multifocal pneumonia [J18.9]   1.  Acute kidney injury. Requiring hemodialysis. With obstructive uropathy Baseline creatinine 0.81 from 10/14/2023.  Acute kidney injury secondary to ATN, obstructive uropathy, and progression of prerenal azotemia.    Patient is critically ill.  He was started on urgent hemodialysis for severe hyperkalemia and uremia. Vascular surgery placed PermCath on 6/26.  - TOC dialysis coordinator notified of need for outpatient dialysis clinic.  Search remains in progress. - Patient received treatment yesterday, UF 1L. Plan for next treatment Monday. - Accepted at [facility] on a TTS schedule, chair time 7:10am.  -Will transition for TTS schedule later next week as permitted.  2.  Sepsis and hypotension due to aspiration pneumonia, found to have distal esophageal obstruction with severe dysmotility.  He is status post botulinum toxin injection to the lower esophageal sphincter.  PEG tube placed this admission. Receiving continuous tube feeds with scheduled free water  flushes.  - Continue midodrine    4.Anemia in the setting of renal failure Recent Labs       Lab Results  Component Value Date    HGB 7.4 (L) 12/07/2023    - Continue IV EPO with HD treatments.  -Hemoglobin 7.4 unchanged   5.  Bilateral hydronephrosis Noted on CT.Urology team has evaluated the patient and it is thought to be secondary to reflux.  Due to health, further intervention deferred at this time.     LOS: 61 Joah Patlan P Levorn 7/12/20254:18 PM

## 2023-12-07 NOTE — Plan of Care (Signed)
 ?  Problem: Clinical Measurements: ?Goal: Will remain free from infection ?Outcome: Progressing ?  ?

## 2023-12-08 DIAGNOSIS — R627 Adult failure to thrive: Secondary | ICD-10-CM | POA: Diagnosis not present

## 2023-12-08 LAB — BASIC METABOLIC PANEL WITH GFR
Anion gap: 14 (ref 5–15)
BUN: 62 mg/dL — ABNORMAL HIGH (ref 8–23)
CO2: 25 mmol/L (ref 22–32)
Calcium: 7.9 mg/dL — ABNORMAL LOW (ref 8.9–10.3)
Chloride: 93 mmol/L — ABNORMAL LOW (ref 98–111)
Creatinine, Ser: 5.45 mg/dL — ABNORMAL HIGH (ref 0.61–1.24)
GFR, Estimated: 10 mL/min — ABNORMAL LOW (ref >60)
Glucose, Bld: 104 mg/dL — ABNORMAL HIGH (ref 70–99)
Potassium: 3.8 mmol/L (ref 3.5–5.1)
Sodium: 132 mmol/L — ABNORMAL LOW (ref 135–145)

## 2023-12-08 LAB — CBC
HCT: 24 % — ABNORMAL LOW (ref 39.0–52.0)
Hemoglobin: 7.2 g/dL — ABNORMAL LOW (ref 13.0–17.0)
MCH: 27.7 pg (ref 26.0–34.0)
MCHC: 30 g/dL (ref 30.0–36.0)
MCV: 92.3 fL (ref 80.0–100.0)
Platelets: 408 K/uL — ABNORMAL HIGH (ref 150–400)
RBC: 2.6 MIL/uL — ABNORMAL LOW (ref 4.22–5.81)
RDW: 15.9 % — ABNORMAL HIGH (ref 11.5–15.5)
WBC: 14.6 K/uL — ABNORMAL HIGH (ref 4.0–10.5)
nRBC: 0 % (ref 0.0–0.2)

## 2023-12-08 LAB — GLUCOSE, CAPILLARY
Glucose-Capillary: 110 mg/dL — ABNORMAL HIGH (ref 70–99)
Glucose-Capillary: 110 mg/dL — ABNORMAL HIGH (ref 70–99)
Glucose-Capillary: 126 mg/dL — ABNORMAL HIGH (ref 70–99)

## 2023-12-08 MED ORDER — POLYETHYLENE GLYCOL 3350 17 G PO PACK
17.0000 g | PACK | Freq: Every day | ORAL | Status: DC
  Administered 2023-12-08: 17 g
  Filled 2023-12-08: qty 1

## 2023-12-08 MED ORDER — CHLORHEXIDINE GLUCONATE CLOTH 2 % EX PADS
6.0000 | MEDICATED_PAD | Freq: Every day | CUTANEOUS | Status: DC
  Administered 2023-12-09 – 2023-12-16 (×8): 6 via TOPICAL

## 2023-12-08 MED ORDER — SENNOSIDES 8.8 MG/5ML PO SYRP
5.0000 mL | ORAL_SOLUTION | Freq: Every day | ORAL | Status: DC
  Filled 2023-12-08: qty 5

## 2023-12-08 NOTE — Plan of Care (Signed)
  Problem: Nutrition: Goal: Adequate nutrition will be maintained Outcome: Progressing   Problem: Education: Goal: Knowledge of General Education information will improve Description: Including pain rating scale, medication(s)/side effects and non-pharmacologic comfort measures Outcome: Not Progressing   Problem: Health Behavior/Discharge Planning: Goal: Ability to manage health-related needs will improve Outcome: Not Progressing   Problem: Activity: Goal: Risk for activity intolerance will decrease Outcome: Not Progressing   

## 2023-12-08 NOTE — Progress Notes (Signed)
 Physical Therapy Treatment Patient Details Name: Robert Vega MRN: [id number] DOB: 12-19-1940 Today's Date: 12/08/2023   History of Present Illness 83 y.o male with significant PMH of OSA, GERD, Obesity, HTN, Dysphagia: EGD 03/2022 with food in upper esophagus complicated by aspiration event, cardiac arrest with round of CPR, and post resuscitation EGD with concern for lack of peristalsis. Pt presented to ED on 10/05/2023 with hypoxia, fever and generalized weakness; developed acute respiratory failure requiring intubation 5/10 due to aspiration pneumonia, extubated 5/15, but had significant agitation required brief course of Precedex ; pt now s/p exploratory laparotomy with closure of gastrotomy and insertion of gastrostomy tube on 10/22/23.  PT order discontinued by MD 11/10/23 and new PT consult received 11/14/23.  Pt s/p R temporary femoral vein dialysis catheter placement 11/11/23; now removed; pt now with HD perm cath.    PT Comments  Pt asleep.  Awoke with cold wash cloth to face and needed mod cues to remain awake.  Rolling left/right for sling placement with max a x 2.  Lift used to position pt in recliner.  Tech in to assist.  Position to comfort.  Nursing to return to bed this afternoon.  Will try to schedule with mobility tech to assist.     If plan is discharge home, recommend the following: Two people to help with walking and/or transfers;Two people to help with bathing/dressing/bathroom;Assistance with cooking/housework;Direct supervision/assist for medications management;Direct supervision/assist for financial management;Assist for transportation;Help with stairs or ramp for entrance;Supervision due to cognitive status   Can travel by private vehicle        Equipment Recommendations       Recommendations for Other Services       Precautions / Restrictions Precautions Precautions: Fall Recall of Precautions/Restrictions: Impaired Precaution/Restrictions Comments: PEG; perm cath  (HD) Restrictions Weight Bearing Restrictions Per Provider Order: No Other Position/Activity Restrictions: Safety mitts donned     Mobility  Bed Mobility Overal bed mobility: Needs Assistance Bed Mobility: Rolling Rolling: Total assist, +2 for physical assistance           Patient Response: Flat affect  Transfers Overall transfer level: Needs assistance   Transfers: Bed to chair/wheelchair/BSC             General transfer comment: x 2 for lift in room Transfer via Lift Equipment: [place]  Ambulation/Gait               General Gait Details: not appropriate at this time   Stairs             Wheelchair Mobility     Tilt Bed Tilt Bed Patient Response: Flat affect  Modified Rankin (Stroke Patients Only)       Balance                                            Communication Communication Communication: Impaired  Cognition Arousal: Lethargic Behavior During Therapy: WFL for tasks assessed/performed, Flat affect   PT - Cognitive impairments: No family/caregiver present to determine baseline, Difficult to assess Difficult to assess due to: Impaired communication, Level of arousal                       Following commands: Impaired Following commands impaired: Follows one step commands inconsistently    Cueing Cueing Techniques: Verbal cues, Gestural cues, Tactile cues, Visual cues  Exercises  General Comments        Pertinent Vitals/Pain Pain Assessment Pain Assessment: Faces Faces Pain Scale: Hurts little more Pain Location: some with rolling left/rigth but did not specify location Pain Descriptors / Indicators: Grimacing, Guarding Pain Intervention(s): Limited activity within patient's tolerance, Monitored during session, Repositioned    Home Living                          Prior Function            PT Goals (current goals can now be found in the care plan section) Progress towards  PT goals: Progressing toward goals    Frequency    Min 1X/week      PT Plan      Co-evaluation              AM-PAC PT 6 Clicks Mobility   Outcome Measure  Help needed turning from your back to your side while in a flat bed without using bedrails?: Total Help needed moving from lying on your back to sitting on the side of a flat bed without using bedrails?: Total Help needed moving to and from a bed to a chair (including a wheelchair)?: Total Help needed standing up from a chair using your arms (e.g., wheelchair or bedside chair)?: Total Help needed to walk in hospital room?: Total Help needed climbing 3-5 steps with a railing? : Total 6 Click Score: 6    End of Session Equipment Utilized During Treatment: Oxygen Activity Tolerance: Patient limited by lethargy Patient left: in chair;with chair alarm set Nurse Communication: Mobility status;Need for lift equipment PT Visit Diagnosis: Other abnormalities of gait and mobility (R26.89);Muscle weakness (generalized) (M62.81)     Time: 8986-8975 PT Time Calculation (min) (ACUTE ONLY): 11 min  Charges:    $Therapeutic Activity: 8-22 mins                      Lauraine Gills, PTA 12/08/23, 11:33 AM

## 2023-12-08 NOTE — Progress Notes (Signed)
 [facility]  ROUNDING NOTE   Subjective:  Patient seen and evaluated at bedside, mitts in place.  Patient only responds to name by saying yes once and little tracking with eyes.  Patient does not respond to commands.  Noted shallow breathing and distended abdomen primary team made aware.   Objective:  Vital signs in last 24 hours:  Temp:  [97.6 F (36.4 C)-99 F (37.2 C)] 97.6 F (36.4 C) (07/13 0814) Pulse Rate:  [69-79] 78 (07/13 0814) Resp:  [16] 16 (07/13 0814) BP: (127-139)/(55-75) 132/75 (07/13 0814) SpO2:  [95 %-97 %] 97 % (07/13 0814)  Weight change:  Filed Weights   11/29/23 0830 11/29/23 1230 12/02/23 1726  Weight: 96.8 kg 95.3 kg 94.3 kg    Intake/Output: I/O last 3 completed shifts: In: 755.8 [Other:400; NG/GT:305.8; IV Piggyback:50] Out: -    Intake/Output this shift:  No intake/output data recorded.  Physical Exam: General: Ill-appearing  Head: Normocephalic  Eyes: Anicteric  Neck: Supple  Lungs:  Shallow breathing, diminished to auscultation, 2 L nasal cannula  Heart: S1-S2  Abdomen:  Distended  Extremities: Trace peripheral edema.  Neurologic: GCS 5  Skin: Cool  Access: Right chest PermCath    Basic Metabolic Panel: Recent Labs  Lab 12/03/23 0959 12/05/23 0522 12/06/23 0415 12/07/23 0640 12/08/23 0529  NA 136 132* 134* 133* 132*  K 3.7 3.4* 3.6 3.4* 3.8  CL 97* 93* 93* 95* 93*  CO2 26 26 26 28 25   GLUCOSE 117* 124* 120* 122* 104*  BUN 65* 44* 63* 41* 62*  CREATININE 5.21* 4.47* 5.96* 4.19* 5.45*  CALCIUM  7.8* 8.0* 7.7* 7.9* 7.9*  PHOS 5.7*  --   --   --   --     Liver Function Tests: Recent Labs  Lab 12/03/23 0959  ALBUMIN  1.8*   No results for input(s): LIPASE, AMYLASE in the last 168 hours. No results for input(s): AMMONIA in the last 168 hours.  CBC: Recent Labs  Lab 12/03/23 0959 12/05/23 0522 12/06/23 0415 12/07/23 0640 12/08/23 0529  WBC 18.8* 17.1* 15.7* 13.1* 14.6*  NEUTROABS 13.8*  --   --    --   --   HGB 7.6* 7.3* 7.4* 7.4* 7.2*  HCT 24.6* 23.6* 24.1* 24.9* 24.0*  MCV 93.2 91.1 92.0 92.9 92.3  PLT 468* 451* 482* 454* 408*    Cardiac Enzymes: No results for input(s): CKTOTAL, CKMB, CKMBINDEX, TROPONINI in the last 168 hours.  BNP: Invalid input(s): POCBNP  CBG: Recent Labs  Lab 12/07/23 0824 12/07/23 1146 12/07/23 1555 12/08/23 0820 12/08/23 1116  GLUCAP 118* 125* 121* 110* 110*    Microbiology: Results for orders placed or performed during the hospital encounter of 10/05/23  Blood Culture (routine x 2)     Status: None   Collection Time: 10/05/23  5:06 AM   Specimen: BLOOD  Result Value Ref Range Status   Specimen Description BLOOD LA  Final   Special Requests   Final    BOTTLES DRAWN AEROBIC AND ANAEROBIC Blood Culture results may not be optimal due to an inadequate volume of blood received in culture bottles   Culture   Final    NO GROWTH 5 DAYS Performed at [facility], [address], [place], [place] [address]    Report Status 10/10/2023 FINAL  Final  Blood Culture (routine x 2)     Status: None   Collection Time: 10/05/23  5:07 AM   Specimen: BLOOD  Result Value Ref Range Status  Specimen Description BLOOD RA  Final   Special Requests   Final    BOTTLES DRAWN AEROBIC AND ANAEROBIC Blood Culture results may not be optimal due to an inadequate volume of blood received in culture bottles   Culture   Final    NO GROWTH 5 DAYS Performed at [facility], [address] [place], [place] [address]    Report Status 10/10/2023 FINAL  Final  Resp panel by RT-PCR (RSV, Flu A&B, Covid) Anterior Nasal Swab     Status: None   Collection Time: 10/05/23  5:56 AM   Specimen: Anterior Nasal Swab  Result Value Ref Range Status   SARS Coronavirus 2 by RT PCR NEGATIVE NEGATIVE Final    Comment: (NOTE) SARS-CoV-2 target nucleic acids are NOT DETECTED.  The SARS-CoV-2 RNA is generally detectable in upper respiratory specimens  during the acute phase of infection. The lowest concentration of SARS-CoV-2 viral copies this assay can detect is 138 copies/mL. A negative result does not preclude SARS-Cov-2 infection and should not be used as the sole basis for treatment or other patient management decisions. A negative result may occur with  improper specimen collection/handling, submission of specimen other than nasopharyngeal swab, presence of viral mutation(s) within the areas targeted by this assay, and inadequate number of viral copies(<138 copies/mL). A negative result must be combined with clinical observations, patient history, and epidemiological information. The expected result is Negative.  Fact Sheet for Patients:  BloggerCourse.com  Fact Sheet for Healthcare Providers:  SeriousBroker.it  This test is no t yet approved or cleared by the [place]  FDA and  has been authorized for detection and/or diagnosis of SARS-CoV-2 by FDA under an Emergency Use Authorization (EUA). This EUA will remain  in effect (meaning this test can be used) for the duration of the COVID-19 declaration under Section 564(b)(1) of the Act, 21 U.S.C.section 360bbb-3(b)(1), unless the authorization is terminated  or revoked sooner.       Influenza A by PCR NEGATIVE NEGATIVE Final   Influenza B by PCR NEGATIVE NEGATIVE Final    Comment: (NOTE) The Xpert Xpress SARS-CoV-2/FLU/RSV plus assay is intended as an aid in the diagnosis of influenza from Nasopharyngeal swab specimens and should not be used as a sole basis for treatment. Nasal washings and aspirates are unacceptable for Xpert Xpress SARS-CoV-2/FLU/RSV testing.  Fact Sheet for Patients: BloggerCourse.com  Fact Sheet for Healthcare Providers: SeriousBroker.it  This test is not yet approved or cleared by the [place]  FDA and has been authorized for detection  and/or diagnosis of SARS-CoV-2 by FDA under an Emergency Use Authorization (EUA). This EUA will remain in effect (meaning this test can be used) for the duration of the COVID-19 declaration under Section 564(b)(1) of the Act, 21 U.S.C. section 360bbb-3(b)(1), unless the authorization is terminated or revoked.     Resp Syncytial Virus by PCR NEGATIVE NEGATIVE Final    Comment: (NOTE) Fact Sheet for Patients: BloggerCourse.com  Fact Sheet for Healthcare Providers: SeriousBroker.it  This test is not yet approved or cleared by the [place]  FDA and has been authorized for detection and/or diagnosis of SARS-CoV-2 by FDA under an Emergency Use Authorization (EUA). This EUA will remain in effect (meaning this test can be used) for the duration of the COVID-19 declaration under Section 564(b)(1) of the Act, 21 U.S.C. section 360bbb-3(b)(1), unless the authorization is terminated or revoked.  Performed at [facility], [address], [place], [place] [address]   Respiratory (~20 pathogens) panel by PCR  Status: None   Collection Time: 10/05/23  8:11 AM   Specimen: Nasopharyngeal Swab; Respiratory  Result Value Ref Range Status   Adenovirus NOT DETECTED NOT DETECTED Final   Coronavirus 229E NOT DETECTED NOT DETECTED Final    Comment: (NOTE) The Coronavirus on the Respiratory Panel, DOES NOT test for the novel  Coronavirus (2019 nCoV)    Coronavirus HKU1 NOT DETECTED NOT DETECTED Final   Coronavirus NL63 NOT DETECTED NOT DETECTED Final   Coronavirus OC43 NOT DETECTED NOT DETECTED Final   Metapneumovirus NOT DETECTED NOT DETECTED Final   Rhinovirus / Enterovirus NOT DETECTED NOT DETECTED Final   Influenza A NOT DETECTED NOT DETECTED Final   Influenza B NOT DETECTED NOT DETECTED Final   Parainfluenza Virus 1 NOT DETECTED NOT DETECTED Final   Parainfluenza Virus 2 NOT DETECTED NOT DETECTED Final   Parainfluenza Virus 3  NOT DETECTED NOT DETECTED Final   Parainfluenza Virus 4 NOT DETECTED NOT DETECTED Final   Respiratory Syncytial Virus NOT DETECTED NOT DETECTED Final   Bordetella pertussis NOT DETECTED NOT DETECTED Final   Bordetella Parapertussis NOT DETECTED NOT DETECTED Final   Chlamydophila pneumoniae NOT DETECTED NOT DETECTED Final   Mycoplasma pneumoniae NOT DETECTED NOT DETECTED Final    Comment: Performed at [facility], [address], [place], [place] [address]  Expectorated Sputum Assessment w Gram Stain, Rflx to Resp Cult     Status: None   Collection Time: 10/05/23  9:07 AM   Specimen: Sputum  Result Value Ref Range Status   Specimen Description SPUTUM  Final   Special Requests NONE  Final   Sputum evaluation   Final    Sputum specimen not acceptable for testing.  Please recollect.   C/KERRY NELSON AT 1005 10/05/23.PMF Performed at [facility], [address] [place], [place] [address]    Report Status 10/05/2023 FINAL  Final  Expectorated Sputum Assessment w Gram Stain, Rflx to Resp Cult     Status: None   Collection Time: 10/05/23 10:50 AM  Result Value Ref Range Status   Specimen Description EXPECTORATED SPUTUM  Final   Special Requests NONE  Final   Sputum evaluation   Final    THIS SPECIMEN IS ACCEPTABLE FOR SPUTUM CULTURE Performed at [facility], [address] [place], [place] [address]    Report Status 10/05/2023 FINAL  Final  Culture, Respiratory w Gram Stain     Status: None   Collection Time: 10/05/23 10:50 AM  Result Value Ref Range Status   Specimen Description   Final    EXPECTORATED SPUTUM Performed at [facility], [address] [place], [place] [address]    Special Requests   Final    NONE Reflexed from [id number] Performed at [facility], [address] [place], [place] [address]    Gram Stain   Final    RARE WBC SEEN RARE VONNE POSITIVE RODS RARE VONNE POSITIVE COCCI RARE GRAM NEGATIVE RODS    Culture    Final    FEW Normal respiratory flora-no Staph aureus or Pseudomonas seen Performed at [facility], [address] [place], [place] [address]    Report Status 10/07/2023 FINAL  Final  MRSA Next Gen by PCR, Nasal     Status: None   Collection Time: 10/06/23 12:57 AM   Specimen: Nasal Mucosa; Nasal Swab  Result Value Ref Range Status   MRSA by PCR Next Gen NOT DETECTED NOT DETECTED Final    Comment: (  NOTE) The GeneXpert MRSA Assay (FDA approved for NASAL specimens only), is one component of a comprehensive MRSA colonization surveillance program. It is not intended to diagnose MRSA infection nor to guide or monitor treatment for MRSA infections. Test performance is not FDA approved in patients less than 75 years old. Performed at [facility], [address] [place], [place] [address]   Culture, Respiratory w Gram Stain     Status: None   Collection Time: 10/06/23 11:37 AM   Specimen: INDUCED SPUTUM  Result Value Ref Range Status   Specimen Description   Final    INDUCED SPUTUM Performed at [facility], [address] [place], [place] [address]    Special Requests   Final    NONE Performed at [facility], [address] [place], [place] [address]    Gram Stain   Final    FEW WBC PRESENT,BOTH PMN AND MONONUCLEAR FEW GRAM POSITIVE RODS    Culture   Final    MODERATE LACTOBACILLUS FERMENTUM Standardized susceptibility testing for this organism is not available. Performed at [facility], [address], [place], [place] [address]    Report Status 10/09/2023 FINAL  Final  Culture, blood (x 2)     Status: None   Collection Time: 10/22/23  1:00 AM   Specimen: BLOOD  Result Value Ref Range Status   Specimen Description BLOOD BLOOD RIGHT ARM  Final   Special Requests   Final    BOTTLES DRAWN AEROBIC AND ANAEROBIC Blood Culture adequate volume   Culture   Final    NO GROWTH 5 DAYS Performed at [facility], [address], [place], [place] [address]    Report Status 10/27/2023 FINAL  Final  Culture, blood (x 2)     Status: None   Collection Time: 10/22/23  1:00 AM   Specimen: BLOOD  Result Value Ref Range Status   Specimen Description BLOOD BLOOD RIGHT ARM  Final   Special Requests   Final    BOTTLES DRAWN AEROBIC AND ANAEROBIC Blood Culture adequate volume   Culture   Final    NO GROWTH 5 DAYS Performed at [facility], [address] [place], [place] [address]    Report Status 10/27/2023 FINAL  Final  Resp panel by RT-PCR (RSV, Flu A&B, Covid) Anterior Nasal Swab     Status: None   Collection Time: 11/09/23  5:31 PM   Specimen: Anterior Nasal Swab  Result Value Ref Range Status   SARS Coronavirus 2 by RT PCR NEGATIVE NEGATIVE Final    Comment: (NOTE) SARS-CoV-2 target nucleic acids are NOT DETECTED.  The SARS-CoV-2 RNA is generally detectable in upper respiratory specimens during the acute phase of infection. The lowest concentration of SARS-CoV-2 viral copies this assay can detect is 138 copies/mL. A negative result does not preclude SARS-Cov-2 infection and should not be used as the sole basis for treatment or other patient management decisions. A negative result may occur with  improper specimen collection/handling, submission of specimen other than nasopharyngeal swab, presence of viral mutation(s) within the areas targeted by this assay, and inadequate number of viral copies(<138 copies/mL). A negative result must be combined with clinical observations, patient history, and epidemiological information. The expected result is Negative.  Fact Sheet for Patients:  BloggerCourse.com  Fact Sheet for Healthcare Providers:  SeriousBroker.it  This test is no t yet approved or cleared by the [place]  FDA and  has been authorized for detection and/or  diagnosis of SARS-CoV-2 by FDA under an Emergency Use Authorization (EUA).  This EUA will remain  in effect (meaning this test can be used) for the duration of the COVID-19 declaration under Section 564(b)(1) of the Act, 21 U.S.C.section 360bbb-3(b)(1), unless the authorization is terminated  or revoked sooner.       Influenza A by PCR NEGATIVE NEGATIVE Final   Influenza B by PCR NEGATIVE NEGATIVE Final    Comment: (NOTE) The Xpert Xpress SARS-CoV-2/FLU/RSV plus assay is intended as an aid in the diagnosis of influenza from Nasopharyngeal swab specimens and should not be used as a sole basis for treatment. Nasal washings and aspirates are unacceptable for Xpert Xpress SARS-CoV-2/FLU/RSV testing.  Fact Sheet for Patients: BloggerCourse.com  Fact Sheet for Healthcare Providers: SeriousBroker.it  This test is not yet approved or cleared by the [place]  FDA and has been authorized for detection and/or diagnosis of SARS-CoV-2 by FDA under an Emergency Use Authorization (EUA). This EUA will remain in effect (meaning this test can be used) for the duration of the COVID-19 declaration under Section 564(b)(1) of the Act, 21 U.S.C. section 360bbb-3(b)(1), unless the authorization is terminated or revoked.     Resp Syncytial Virus by PCR NEGATIVE NEGATIVE Final    Comment: (NOTE) Fact Sheet for Patients: BloggerCourse.com  Fact Sheet for Healthcare Providers: SeriousBroker.it  This test is not yet approved or cleared by the [place]  FDA and has been authorized for detection and/or diagnosis of SARS-CoV-2 by FDA under an Emergency Use Authorization (EUA). This EUA will remain in effect (meaning this test can be used) for the duration of the COVID-19 declaration under Section 564(b)(1) of the Act, 21 U.S.C. section 360bbb-3(b)(1), unless the authorization is terminated or revoked.  Performed at [facility], [address]  [place], [place] [address]   Respiratory (~20 pathogens) panel by PCR     Status: None   Collection Time: 11/09/23  5:31 PM   Specimen: Nasopharyngeal Swab; Respiratory  Result Value Ref Range Status   Adenovirus NOT DETECTED NOT DETECTED Final   Coronavirus 229E NOT DETECTED NOT DETECTED Final    Comment: (NOTE) The Coronavirus on the Respiratory Panel, DOES NOT test for the novel  Coronavirus (2019 nCoV)    Coronavirus HKU1 NOT DETECTED NOT DETECTED Final   Coronavirus NL63 NOT DETECTED NOT DETECTED Final   Coronavirus OC43 NOT DETECTED NOT DETECTED Final   Metapneumovirus NOT DETECTED NOT DETECTED Final   Rhinovirus / Enterovirus NOT DETECTED NOT DETECTED Final   Influenza A NOT DETECTED NOT DETECTED Final   Influenza B NOT DETECTED NOT DETECTED Final   Parainfluenza Virus 1 NOT DETECTED NOT DETECTED Final   Parainfluenza Virus 2 NOT DETECTED NOT DETECTED Final   Parainfluenza Virus 3 NOT DETECTED NOT DETECTED Final   Parainfluenza Virus 4 NOT DETECTED NOT DETECTED Final   Respiratory Syncytial Virus NOT DETECTED NOT DETECTED Final   Bordetella pertussis NOT DETECTED NOT DETECTED Final   Bordetella Parapertussis NOT DETECTED NOT DETECTED Final   Chlamydophila pneumoniae NOT DETECTED NOT DETECTED Final   Mycoplasma pneumoniae NOT DETECTED NOT DETECTED Final    Comment: Performed at [facility], [address] [place], [place] [address]  Culture, blood (Routine X 2) w Reflex to ID Panel     Status: None   Collection Time: 11/09/23  6:16 PM   Specimen: BLOOD  Result Value Ref Range Status   Specimen Description   Final    BLOOD RIGHT ANTECUBITAL Performed at  [facility], [address] [place], [place] [address]    Special Requests   Final    BOTTLES DRAWN AEROBIC ONLY Blood Culture adequate volume Performed at [facility], [address], [place], [place] [address]    Culture   Final    NO GROWTH 11 DAYS Performed at [facility], [address], [place], [place] [address]    Report Status 11/20/2023 FINAL  Final  Culture, blood (Routine X 2) w Reflex to ID Panel     Status: None   Collection Time: 11/09/23  6:23 PM   Specimen: BLOOD  Result Value Ref Range Status   Specimen Description   Final    BLOOD BLOOD LEFT FOREARM Performed at [facility], [address], [place], [place] [address]    Special Requests   Final    BOTTLES DRAWN AEROBIC AND ANAEROBIC Blood Culture adequate volume Performed at [facility], [address], [place], [place] [address]    Culture   Final    NO GROWTH 11 DAYS Performed at [facility], [address] [place], [place] [address]    Report Status 11/20/2023 FINAL  Final  Urine Culture     Status: Abnormal   Collection Time: 11/10/23  1:58 AM   Specimen: Urine, Random  Result Value Ref Range Status   Specimen Description   Final    URINE, RANDOM Performed at [facility], [address] [place], [place] [address]    Special Requests   Final    NONE Reflexed from [id number] Performed at [facility], [address] [place], [place] [address]    Culture 60,000 COLONIES/mL ENTEROCOCCUS FAECALIS (A)  Final   Report Status 11/12/2023 FINAL  Final   Organism ID, Bacteria ENTEROCOCCUS FAECALIS (A)  Final      Susceptibility   Enterococcus faecalis - MIC*    AMPICILLIN  <=2 SENSITIVE Sensitive     NITROFURANTOIN <=16 SENSITIVE Sensitive     VANCOMYCIN  1 SENSITIVE Sensitive     * 60,000 COLONIES/mL ENTEROCOCCUS FAECALIS  Culture, blood (x 2)     Status: None   Collection Time: 11/25/23 10:31 PM   Specimen: BLOOD  Result Value Ref Range Status   Specimen Description BLOOD BLOOD LEFT ARM  Final   Special Requests   Final    BOTTLES DRAWN AEROBIC AND ANAEROBIC Blood Culture adequate volume   Culture   Final    NO GROWTH 5 DAYS Performed at [facility], [address] [place], [place] [address]    Report Status 11/30/2023 FINAL   Final  Culture, blood (x 2)     Status: None   Collection Time: 11/25/23 10:31 PM   Specimen: BLOOD  Result Value Ref Range Status   Specimen Description BLOOD BLOOD RIGHT ARM  Final   Special Requests   Final    BOTTLES DRAWN AEROBIC AND ANAEROBIC Blood Culture adequate volume   Culture   Final    NO GROWTH 5 DAYS Performed at [facility], [address] [place], [place] [address]    Report Status 11/30/2023 FINAL  Final  Gastrointestinal Panel by PCR , Stool     Status: None   Collection Time: 11/30/23  5:10 PM   Specimen: Stool  Result Value Ref Range Status   Campylobacter species NOT DETECTED NOT DETECTED Final   Plesimonas shigelloides NOT DETECTED NOT DETECTED Final   Salmonella species NOT DETECTED NOT DETECTED Final  Yersinia enterocolitica NOT DETECTED NOT DETECTED Final   Vibrio species NOT DETECTED NOT DETECTED Final   Vibrio cholerae NOT DETECTED NOT DETECTED Final   Enteroaggregative E coli (EAEC) NOT DETECTED NOT DETECTED Final   Enteropathogenic E coli (EPEC) NOT DETECTED NOT DETECTED Final   Enterotoxigenic E coli (ETEC) NOT DETECTED NOT DETECTED Final   Shiga like toxin producing E coli (STEC) NOT DETECTED NOT DETECTED Final   Shigella/Enteroinvasive E coli (EIEC) NOT DETECTED NOT DETECTED Final   Cryptosporidium NOT DETECTED NOT DETECTED Final   Cyclospora cayetanensis NOT DETECTED NOT DETECTED Final   Entamoeba histolytica NOT DETECTED NOT DETECTED Final   Giardia lamblia NOT DETECTED NOT DETECTED Final   Adenovirus F40/41 NOT DETECTED NOT DETECTED Final   Astrovirus NOT DETECTED NOT DETECTED Final   Norovirus GI/GII NOT DETECTED NOT DETECTED Final   Rotavirus A NOT DETECTED NOT DETECTED Final   Sapovirus (I, II, IV, and V) NOT DETECTED NOT DETECTED Final    Comment: Performed at [facility], [address] [place], [place] [address]    Coagulation Studies: No results for input(s): LABPROT, INR in the last 72  hours.  Urinalysis: No results for input(s): COLORURINE, LABSPEC, PHURINE, GLUCOSEU, HGBUR, BILIRUBINUR, KETONESUR, PROTEINUR, UROBILINOGEN, NITRITE, LEUKOCYTESUR in the last 72 hours.  Invalid input(s): APPERANCEUR    Imaging: No results found.   Medications:    albumin  human 25 g (12/04/23 1630)   piperacillin -tazobactam (ZOSYN )  IV 2.25 g (12/08/23 1329)    sodium chloride    Intravenous Once   acidophilus  1 capsule Oral Daily   Chlorhexidine  Gluconate Cloth  6 each Topical Q0600   epoetin  alfa-epbx (RETACRIT ) injection  10,000 Units Intravenous Q M,W,F-HD   feeding supplement (NEPRO CARB STEADY)  237 mL Oral 5 X Daily   feeding supplement (PROSource TF20)  60 mL Per Tube Daily   ferrous sulfate   325 mg Per Tube Daily   fiber supplement (BANATROL TF)  60 mL Per Tube BID   free water   120 mL Per Tube 5 X Daily   gentamicin  ointment   Topical TID   heparin  injection (subcutaneous)  5,000 Units Subcutaneous Q8H   insulin  aspart  10 Units Subcutaneous Once   midodrine   5 mg Per Tube TID WC   multivitamin  1 tablet Per Tube QHS   pantoprazole  (PROTONIX ) IV  40 mg Intravenous Q12H   polyethylene glycol  17 g Per Tube Daily   acetaminophen  **OR** acetaminophen , albumin  human, artificial tears, bisacodyl , ipratropium-albuterol , [DISCONTINUED] ondansetron  **OR** ondansetron  (ZOFRAN ) IV, mouth rinse  Assessment/ Plan:  Mr. Robert Vega is a 83 y.o.  male with obstructive sleep apnea, GERD, obesity, hypertension, dysphagia, severe esophageal dysmotility with achalasia status post PEG tube placement, recent aspiration pneumonia acute respiratory failure status post extubation 10/09/2023, who was admitted to [facility] on 10/05/2023 for Aspiration into respiratory tract, initial encounter [T17.908A] Severe sepsis (HCC) [A41.9, R65.20] History of dysphagia [Z87.898] Fever, unspecified fever cause [R50.9] Multifocal pneumonia [J18.9]   1.  Acute kidney injury.  Requiring hemodialysis. With obstructive uropathy Baseline creatinine 0.81 from 10/14/2023.  Acute kidney injury secondary to ATN, obstructive uropathy, and progression of prerenal azotemia.   Patient presents with GCS of 5, dialysis does not appear to be improving patient's prognosis. - Patient received treatment Friday, UF 1L. Plan for next treatment tomorrow as scheduled. - Accepted at [facility] on a TTS schedule, chair time 7:10am.  -Will transition for TTS schedule later next week as permitted.  2.  Sepsis and hypotension due to aspiration pneumonia, found to have distal esophageal obstruction with severe dysmotility.  He is status post botulinum toxin injection to the lower esophageal sphincter.  PEG tube placed this admission. Receiving continuous tube feeds with scheduled free water  flushes.  - Continue midodrine    4.Anemia in the setting of renal failure Recent Labs           Lab Results  Component Value Date    HGB 7.2 (L) 12/08/2023    - Continue IV EPO with HD treatments.  -Hemoglobin 7.2    5.  Bilateral hydronephrosis Noted on CT.Urology team has evaluated the patient and it is thought to be secondary to reflux.  Due to health, further intervention deferred at this time.   LOS: 64 Maicey Barrientez P Levorn 7/13/20251:33 PM

## 2023-12-08 NOTE — Progress Notes (Signed)
 PROGRESS NOTE    Robert Vega  FMW:[id number] DOB: 1940-12-13 DOA: 10/05/2023 PCP: Jimmy Charlie FERNS, MD    Assessment & Plan:   Principal Problem:   Severe sepsis [facility]) Active Problems:   Gastrostomy complication (HCC)   Dysphagia   Achalasia of esophagus   AKI (acute kidney injury) (HCC)   Acute delirium   Acute respiratory failure with hypoxia and hypercapnia (HCC)   Essential hypertension, benign   GERD (gastroesophageal reflux disease)   Encounter for dialysis and dialysis catheter care [facility])   Multifocal pneumonia   History of dysphagia   Aspiration pneumonia (HCC)   Obesity (BMI 30-39.9)   Generalized weakness   Generalized abdominal pain   Malnutrition of moderate degree   Melena   Leukocytosis   Perinephric hematoma   Hydronephrosis  Assessment and Plan:  Failure to thrive: secondary to all below. Continue w/ full code, full scope of care as per pt's granddaughter. Poor prognosis. Palliative care recs apprec    SIRS: started on night on 11/25/23. W/ leukocytosis, tachypnea, fever and unknown source. Started on IV broad sprectrum abxs, IV flagyl , vanco, cefepime  initially. Continue on IV zosyn  for 10-14 days as per ID    AKI: secondary to ATN as per nephro. On HD. Can start outpatient HD on 12/12/23. Nephro following and recs apprec   Left perinephric hematoma: w/ b/l hydronephrosis & hydroureter. May get b/l ureteral stents on Monday as per uro. No stone. Etiology unclear. No intervention for hematoma as per uro    Hyperkalemia: resolved   Possible ileus: resolved.    ACD: likely secondary to CKD. S/p 2 units of pRBCs transfused so far. Continue on iron supplement. H&H are trending down today. Will transfuse if Hb < 7.0     Achalasia & dysphagia: w/ high aspiration risk. S/p botulinum toxin injection on 5/20, several EGD and SLP evals. Not safe for PO intake unless family wants to accept risk that he will aspirate again. Continue w/ tube feeds   Severe  sepsis: resolved. See Dr. Lamount note on how pt met severe sepsis criteria. Initially w/ aspiration pna. Later in hospital stay w/ intra-abdominal infection secondary to PEG dislodged. Resolved    Acute hypoxic & hypercapnic respiratory failure: s/p intubation, ventilation & extubation. Continue on supplemental oxygen and wean as tolerated    Chronic pEF CHF: XR shows interstitial edema/CHF.  Fluid/volume management w/ HD   Delirium: vs mild cognitive impairment vs anoxic brain injury during critical illness in the ICU, requiring intubation & pressors.  Has not answered any of my questions the past 72 hrs, mental status waxes & wanes. Continue w/ supportive care. Granddaughter asked to d/c prn antipsychotics.   GERD: continue on PPI    HTN: BP is WNL. Continue on midodrine       Generalized weakness: PT/OT recs SNF   Obesity: BMI 32.8. Would benefit from weight loss   Moderate malnutrition: continue w/ tube feeds    DVT prophylaxis: heparin   Code Status: full Family Communication: discussed pt's care w/ pt's granddaughter, [name], and answered her questions  Disposition Plan: possibly d/c to SNF  Level of care: Med-Surg  Status is: Inpatient Remains inpatient appropriate because: severity of illness. Prognosis is poor. No clinical improvement     Consultants:  ID ICU Nephro Uro   Procedures:   Antimicrobials: zosyn     Subjective: Pt still did not answer any of my questions today  Objective: Vitals:   12/07/23 1554 12/07/23 2000 12/08/23 9660 12/08/23 9185  BP: 139/74  (!) 127/55 132/75  Pulse: 79  69 78  Resp: 16   16  Temp: 98 F (36.7 C) 98.9 F (37.2 C) 99 F (37.2 C) 97.6 F (36.4 C)  TempSrc:  Axillary Axillary Axillary  SpO2: 97%  95% 97%  Weight:      Height:        Intake/Output Summary (Last 24 hours) at 12/08/2023 9167 Last data filed at 12/08/2023 0700 Gross per 24 hour  Intake 755.83 ml  Output --  Net 755.83 ml    Filed Weights    11/29/23 0830 11/29/23 1230 12/02/23 1726  Weight: 96.8 kg 95.3 kg 94.3 kg    Examination:  General exam: appears confused Respiratory system: diminished breath sounds b/l  Gastrointestinal system: abd is soft, NT, obese & hypoactive bowel sounds  Central nervous system: Lethargic  Psychiatry: judgement and insight appears poor     Data Reviewed: I have personally reviewed following labs and imaging studies  CBC: Recent Labs  Lab 12/03/23 0959 12/05/23 0522 12/06/23 0415 12/07/23 0640 12/08/23 0529  WBC 18.8* 17.1* 15.7* 13.1* 14.6*  NEUTROABS 13.8*  --   --   --   --   HGB 7.6* 7.3* 7.4* 7.4* 7.2*  HCT 24.6* 23.6* 24.1* 24.9* 24.0*  MCV 93.2 91.1 92.0 92.9 92.3  PLT 468* 451* 482* 454* 408*   Basic Metabolic Panel: Recent Labs  Lab 12/03/23 0959 12/05/23 0522 12/06/23 0415 12/07/23 0640 12/08/23 0529  NA 136 132* 134* 133* 132*  K 3.7 3.4* 3.6 3.4* 3.8  CL 97* 93* 93* 95* 93*  CO2 26 26 26 28 25   GLUCOSE 117* 124* 120* 122* 104*  BUN 65* 44* 63* 41* 62*  CREATININE 5.21* 4.47* 5.96* 4.19* 5.45*  CALCIUM  7.8* 8.0* 7.7* 7.9* 7.9*  PHOS 5.7*  --   --   --   --    GFR: Estimated Creatinine Clearance: 11.4 mL/min (A) (by C-G formula based on SCr of 5.45 mg/dL (H)). Liver Function Tests: Recent Labs  Lab 12/03/23 0959  ALBUMIN  1.8*   No results for input(s): LIPASE, AMYLASE in the last 168 hours. No results for input(s): AMMONIA in the last 168 hours. Coagulation Profile: No results for input(s): INR, PROTIME in the last 168 hours. Cardiac Enzymes: No results for input(s): CKTOTAL, CKMB, CKMBINDEX, TROPONINI in the last 168 hours. BNP (last 3 results) No results for input(s): PROBNP in the last 8760 hours. HbA1C: No results for input(s): HGBA1C in the last 72 hours. CBG: Recent Labs  Lab 12/06/23 2043 12/07/23 0824 12/07/23 1146 12/07/23 1555 12/08/23 0820  GLUCAP 103* 118* 125* 121* 110*   Lipid Profile: No results for  input(s): CHOL, HDL, LDLCALC, TRIG, CHOLHDL, LDLDIRECT in the last 72 hours. Thyroid  Function Tests: No results for input(s): TSH, T4TOTAL, FREET4, T3FREE, THYROIDAB in the last 72 hours. Anemia Panel: No results for input(s): VITAMINB12, FOLATE, FERRITIN, TIBC, IRON, RETICCTPCT in the last 72 hours. Sepsis Labs: No results for input(s): PROCALCITON, LATICACIDVEN in the last 168 hours.  Recent Results (from the past 240 hours)  Gastrointestinal Panel by PCR , Stool     Status: None   Collection Time: 11/30/23  5:10 PM   Specimen: Stool  Result Value Ref Range Status   Campylobacter species NOT DETECTED NOT DETECTED Final   Plesimonas shigelloides NOT DETECTED NOT DETECTED Final   Salmonella species NOT DETECTED NOT DETECTED Final   Yersinia enterocolitica NOT DETECTED NOT DETECTED Final   Vibrio species NOT  DETECTED NOT DETECTED Final   Vibrio cholerae NOT DETECTED NOT DETECTED Final   Enteroaggregative E coli (EAEC) NOT DETECTED NOT DETECTED Final   Enteropathogenic E coli (EPEC) NOT DETECTED NOT DETECTED Final   Enterotoxigenic E coli (ETEC) NOT DETECTED NOT DETECTED Final   Shiga like toxin producing E coli (STEC) NOT DETECTED NOT DETECTED Final   Shigella/Enteroinvasive E coli (EIEC) NOT DETECTED NOT DETECTED Final   Cryptosporidium NOT DETECTED NOT DETECTED Final   Cyclospora cayetanensis NOT DETECTED NOT DETECTED Final   Entamoeba histolytica NOT DETECTED NOT DETECTED Final   Giardia lamblia NOT DETECTED NOT DETECTED Final   Adenovirus F40/41 NOT DETECTED NOT DETECTED Final   Astrovirus NOT DETECTED NOT DETECTED Final   Norovirus GI/GII NOT DETECTED NOT DETECTED Final   Rotavirus A NOT DETECTED NOT DETECTED Final   Sapovirus (I, II, IV, and V) NOT DETECTED NOT DETECTED Final    Comment: Performed at [facility], [address], [place], [place] [address]         Radiology Studies: No results found.       Scheduled  Meds:  sodium chloride    Intravenous Once   acidophilus  1 capsule Oral Daily   Chlorhexidine  Gluconate Cloth  6 each Topical Q0600   epoetin  alfa-epbx (RETACRIT ) injection  10,000 Units Intravenous Q M,W,F-HD   feeding supplement (NEPRO CARB STEADY)  237 mL Oral 5 X Daily   feeding supplement (PROSource TF20)  60 mL Per Tube Daily   ferrous sulfate   325 mg Per Tube Daily   fiber supplement (BANATROL TF)  60 mL Per Tube BID   free water   120 mL Per Tube 5 X Daily   gentamicin  ointment   Topical TID   heparin  injection (subcutaneous)  5,000 Units Subcutaneous Q8H   insulin  aspart  10 Units Subcutaneous Once   midodrine   5 mg Per Tube TID WC   multivitamin  1 tablet Per Tube QHS   pantoprazole  (PROTONIX ) IV  40 mg Intravenous Q12H   Continuous Infusions:  albumin  human 25 g (12/04/23 1630)   piperacillin -tazobactam (ZOSYN )  IV Stopped (12/08/23 0618)     LOS: 64 days      Anthony CHRISTELLA Pouch, MD [facility] Pager [phone]  If 7PM-7AM, please contact night-coverage [email or web address] 12/08/2023, 8:32 AM

## 2023-12-09 DIAGNOSIS — R627 Adult failure to thrive: Secondary | ICD-10-CM | POA: Diagnosis not present

## 2023-12-09 DIAGNOSIS — G9340 Encephalopathy, unspecified: Secondary | ICD-10-CM | POA: Diagnosis not present

## 2023-12-09 DIAGNOSIS — N1330 Unspecified hydronephrosis: Secondary | ICD-10-CM | POA: Diagnosis not present

## 2023-12-09 DIAGNOSIS — J690 Pneumonitis due to inhalation of food and vomit: Secondary | ICD-10-CM | POA: Diagnosis not present

## 2023-12-09 DIAGNOSIS — D72829 Elevated white blood cell count, unspecified: Secondary | ICD-10-CM | POA: Diagnosis not present

## 2023-12-09 LAB — CBC
HCT: 23.4 % — ABNORMAL LOW (ref 39.0–52.0)
Hemoglobin: 7.4 g/dL — ABNORMAL LOW (ref 13.0–17.0)
MCH: 28.5 pg (ref 26.0–34.0)
MCHC: 31.6 g/dL (ref 30.0–36.0)
MCV: 90 fL (ref 80.0–100.0)
Platelets: 410 K/uL — ABNORMAL HIGH (ref 150–400)
RBC: 2.6 MIL/uL — ABNORMAL LOW (ref 4.22–5.81)
RDW: 15.9 % — ABNORMAL HIGH (ref 11.5–15.5)
WBC: 14.8 K/uL — ABNORMAL HIGH (ref 4.0–10.5)
nRBC: 0 % (ref 0.0–0.2)

## 2023-12-09 LAB — GLUCOSE, CAPILLARY
Glucose-Capillary: 102 mg/dL — ABNORMAL HIGH (ref 70–99)
Glucose-Capillary: 135 mg/dL — ABNORMAL HIGH (ref 70–99)
Glucose-Capillary: 106 mg/dL — ABNORMAL HIGH (ref 70–99)
Glucose-Capillary: 105 mg/dL — ABNORMAL HIGH (ref 70–99)

## 2023-12-09 LAB — BASIC METABOLIC PANEL WITH GFR
Anion gap: 15 (ref 5–15)
BUN: 86 mg/dL — ABNORMAL HIGH (ref 8–23)
CO2: 23 mmol/L (ref 22–32)
Calcium: 7.9 mg/dL — ABNORMAL LOW (ref 8.9–10.3)
Chloride: 96 mmol/L — ABNORMAL LOW (ref 98–111)
Creatinine, Ser: 7.16 mg/dL — ABNORMAL HIGH (ref 0.61–1.24)
GFR, Estimated: 7 mL/min — ABNORMAL LOW (ref >60)
Glucose, Bld: 95 mg/dL (ref 70–99)
Potassium: 4.1 mmol/L (ref 3.5–5.1)
Sodium: 134 mmol/L — ABNORMAL LOW (ref 135–145)

## 2023-12-09 LAB — HEPATITIS B SURFACE ANTIGEN: Hepatitis B Surface Ag: NONREACTIVE

## 2023-12-09 MED ORDER — SENNOSIDES 8.8 MG/5ML PO SYRP
5.0000 mL | ORAL_SOLUTION | Freq: Every evening | ORAL | Status: DC | PRN
  Filled 2023-12-09: qty 5

## 2023-12-09 MED ORDER — EPOETIN ALFA-EPBX 10000 UNIT/ML IJ SOLN
INTRAMUSCULAR | Status: AC
  Filled 2023-12-09: qty 1

## 2023-12-09 MED ORDER — ALBUMIN HUMAN 25 % IV SOLN
INTRAVENOUS | Status: AC
  Filled 2023-12-09: qty 100

## 2023-12-09 NOTE — Progress Notes (Signed)
 [facility]  ROUNDING NOTE   Subjective:   Patient seen and evaluated during dialysis   HEMODIALYSIS FLOWSHEET:  Blood Flow Rate (mL/min): 399 mL/min Arterial Pressure (mmHg): -206.25 mmHg Venous Pressure (mmHg): 171.3 mmHg TMP (mmHg): 25.65 mmHg Ultrafiltration Rate (mL/min): 686 mL/min Dialysate Flow Rate (mL/min): 300 ml/min Dialysis Fluid Bolus: Normal Saline Bolus Amount (mL): 100 mL  Patient seated in recliner receiving dialysis Occasional groan/moan.  Objective:  Vital signs in last 24 hours:  Temp:  [97.4 F (36.3 C)-99.5 F (37.5 C)] 98.2 F (36.8 C) (07/14 0911) Pulse Rate:  [66-80] 66 (07/14 1100) Resp:  [15-28] 18 (07/14 1100) BP: (111-140)/(58-98) 121/59 (07/14 1100) SpO2:  [95 %-98 %] 96 % (07/14 1100)  Weight change:  Filed Weights   11/29/23 0830 11/29/23 1230 12/02/23 1726  Weight: 96.8 kg 95.3 kg 94.3 kg    Intake/Output: I/O last 3 completed shifts: In: 755.8 [Other:400; NG/GT:305.8; IV Piggyback:50] Out: -    Intake/Output this shift:  No intake/output data recorded.  Physical Exam: General: Chronically ill appearing, seated in chair  Head: Oral mucosa moist  Eyes: Anicteric  Lungs:  Diminished  Heart: Regular rate and rhythm  Abdomen:  Peg tube in place, +distended  Extremities: no peripheral edema.  Neurologic: Alert  Skin: No lesions  Access: Rt internal jugular permcath    Basic Metabolic Panel: Recent Labs  Lab 12/03/23 0959 12/05/23 0522 12/06/23 0415 12/07/23 0640 12/08/23 0529 12/09/23 0553  NA 136 132* 134* 133* 132* 134*  K 3.7 3.4* 3.6 3.4* 3.8 4.1  CL 97* 93* 93* 95* 93* 96*  CO2 26 26 26 28 25 23   GLUCOSE 117* 124* 120* 122* 104* 95  BUN 65* 44* 63* 41* 62* 86*  CREATININE 5.21* 4.47* 5.96* 4.19* 5.45* 7.16*  CALCIUM  7.8* 8.0* 7.7* 7.9* 7.9* 7.9*  PHOS 5.7*  --   --   --   --   --     Liver Function Tests: Recent Labs  Lab 12/03/23 0959  ALBUMIN  1.8*   No results for input(s): LIPASE,  AMYLASE in the last 168 hours. No results for input(s): AMMONIA in the last 168 hours.  CBC: Recent Labs  Lab 12/03/23 0959 12/05/23 0522 12/06/23 0415 12/07/23 0640 12/08/23 0529 12/09/23 0553  WBC 18.8* 17.1* 15.7* 13.1* 14.6* 14.8*  NEUTROABS 13.8*  --   --   --   --   --   HGB 7.6* 7.3* 7.4* 7.4* 7.2* 7.4*  HCT 24.6* 23.6* 24.1* 24.9* 24.0* 23.4*  MCV 93.2 91.1 92.0 92.9 92.3 90.0  PLT 468* 451* 482* 454* 408* 410*    Cardiac Enzymes: No results for input(s): CKTOTAL, CKMB, CKMBINDEX, TROPONINI in the last 168 hours.  BNP: Invalid input(s): POCBNP  CBG: Recent Labs  Lab 12/07/23 1555 12/08/23 0820 12/08/23 1116 12/08/23 1729 12/09/23 0812  GLUCAP 121* 110* 110* 126* 102*    Microbiology: Results for orders placed or performed during the hospital encounter of 10/05/23  Blood Culture (routine x 2)     Status: None   Collection Time: 10/05/23  5:06 AM   Specimen: BLOOD  Result Value Ref Range Status   Specimen Description BLOOD LA  Final   Special Requests   Final    BOTTLES DRAWN AEROBIC AND ANAEROBIC Blood Culture results may not be optimal due to an inadequate volume of blood received in culture bottles   Culture   Final    NO GROWTH 5 DAYS Performed at [facility], [address], [place], [place] [address]    Report Status 10/10/2023 FINAL  Final  Blood Culture (routine x 2)     Status: None   Collection Time: 10/05/23  5:07 AM   Specimen: BLOOD  Result Value Ref Range Status   Specimen Description BLOOD RA  Final   Special Requests   Final    BOTTLES DRAWN AEROBIC AND ANAEROBIC Blood Culture results may not be optimal due to an inadequate volume of blood received in culture bottles   Culture   Final    NO GROWTH 5 DAYS Performed at [facility], [address] [place], [place] [address]    Report Status 10/10/2023 FINAL  Final  Resp panel by RT-PCR (RSV, Flu A&B, Covid) Anterior Nasal Swab     Status: None    Collection Time: 10/05/23  5:56 AM   Specimen: Anterior Nasal Swab  Result Value Ref Range Status   SARS Coronavirus 2 by RT PCR NEGATIVE NEGATIVE Final    Comment: (NOTE) SARS-CoV-2 target nucleic acids are NOT DETECTED.  The SARS-CoV-2 RNA is generally detectable in upper respiratory specimens during the acute phase of infection. The lowest concentration of SARS-CoV-2 viral copies this assay can detect is 138 copies/mL. A negative result does not preclude SARS-Cov-2 infection and should not be used as the sole basis for treatment or other patient management decisions. A negative result may occur with  improper specimen collection/handling, submission of specimen other than nasopharyngeal swab, presence of viral mutation(s) within the areas targeted by this assay, and inadequate number of viral copies(<138 copies/mL). A negative result must be combined with clinical observations, patient history, and epidemiological information. The expected result is Negative.  Fact Sheet for Patients:  BloggerCourse.com  Fact Sheet for Healthcare Providers:  SeriousBroker.it  This test is no t yet approved or cleared by the [place]  FDA and  has been authorized for detection and/or diagnosis of SARS-CoV-2 by FDA under an Emergency Use Authorization (EUA). This EUA will remain  in effect (meaning this test can be used) for the duration of the COVID-19 declaration under Section 564(b)(1) of the Act, 21 U.S.C.section 360bbb-3(b)(1), unless the authorization is terminated  or revoked sooner.       Influenza A by PCR NEGATIVE NEGATIVE Final   Influenza B by PCR NEGATIVE NEGATIVE Final    Comment: (NOTE) The Xpert Xpress SARS-CoV-2/FLU/RSV plus assay is intended as an aid in the diagnosis of influenza from Nasopharyngeal swab specimens and should not be used as a sole basis for treatment. Nasal washings and aspirates are unacceptable for  Xpert Xpress SARS-CoV-2/FLU/RSV testing.  Fact Sheet for Patients: BloggerCourse.com  Fact Sheet for Healthcare Providers: SeriousBroker.it  This test is not yet approved or cleared by the [place]  FDA and has been authorized for detection and/or diagnosis of SARS-CoV-2 by FDA under an Emergency Use Authorization (EUA). This EUA will remain in effect (meaning this test can be used) for the duration of the COVID-19 declaration under Section 564(b)(1) of the Act, 21 U.S.C. section 360bbb-3(b)(1), unless the authorization is terminated or revoked.     Resp Syncytial Virus by PCR NEGATIVE NEGATIVE Final    Comment: (NOTE) Fact Sheet for Patients: BloggerCourse.com  Fact Sheet for Healthcare Providers: SeriousBroker.it  This test is not yet approved or cleared by the [place]  FDA and has been authorized for detection and/or diagnosis of SARS-CoV-2 by FDA under an Emergency Use Authorization (EUA). This EUA will remain in effect (meaning this test can be  used) for the duration of the COVID-19 declaration under Section 564(b)(1) of the Act, 21 U.S.C. section 360bbb-3(b)(1), unless the authorization is terminated or revoked.  Performed at [facility], [address] [place], [place] [address]   Respiratory (~20 pathogens) panel by PCR     Status: None   Collection Time: 10/05/23  8:11 AM   Specimen: Nasopharyngeal Swab; Respiratory  Result Value Ref Range Status   Adenovirus NOT DETECTED NOT DETECTED Final   Coronavirus 229E NOT DETECTED NOT DETECTED Final    Comment: (NOTE) The Coronavirus on the Respiratory Panel, DOES NOT test for the novel  Coronavirus (2019 nCoV)    Coronavirus HKU1 NOT DETECTED NOT DETECTED Final   Coronavirus NL63 NOT DETECTED NOT DETECTED Final   Coronavirus OC43 NOT DETECTED NOT DETECTED Final   Metapneumovirus NOT DETECTED NOT  DETECTED Final   Rhinovirus / Enterovirus NOT DETECTED NOT DETECTED Final   Influenza A NOT DETECTED NOT DETECTED Final   Influenza B NOT DETECTED NOT DETECTED Final   Parainfluenza Virus 1 NOT DETECTED NOT DETECTED Final   Parainfluenza Virus 2 NOT DETECTED NOT DETECTED Final   Parainfluenza Virus 3 NOT DETECTED NOT DETECTED Final   Parainfluenza Virus 4 NOT DETECTED NOT DETECTED Final   Respiratory Syncytial Virus NOT DETECTED NOT DETECTED Final   Bordetella pertussis NOT DETECTED NOT DETECTED Final   Bordetella Parapertussis NOT DETECTED NOT DETECTED Final   Chlamydophila pneumoniae NOT DETECTED NOT DETECTED Final   Mycoplasma pneumoniae NOT DETECTED NOT DETECTED Final    Comment: Performed at [facility], [address], [place], [place] [address]  Expectorated Sputum Assessment w Gram Stain, Rflx to Resp Cult     Status: None   Collection Time: 10/05/23  9:07 AM   Specimen: Sputum  Result Value Ref Range Status   Specimen Description SPUTUM  Final   Special Requests NONE  Final   Sputum evaluation   Final    Sputum specimen not acceptable for testing.  Please recollect.   C/KERRY NELSON AT 1005 10/05/23.PMF Performed at [facility], [address] [place], [place] [address]    Report Status 10/05/2023 FINAL  Final  Expectorated Sputum Assessment w Gram Stain, Rflx to Resp Cult     Status: None   Collection Time: 10/05/23 10:50 AM  Result Value Ref Range Status   Specimen Description EXPECTORATED SPUTUM  Final   Special Requests NONE  Final   Sputum evaluation   Final    THIS SPECIMEN IS ACCEPTABLE FOR SPUTUM CULTURE Performed at [facility], [address] [place], [place] [address]    Report Status 10/05/2023 FINAL  Final  Culture, Respiratory w Gram Stain     Status: None   Collection Time: 10/05/23 10:50 AM  Result Value Ref Range Status   Specimen Description   Final    EXPECTORATED SPUTUM Performed at [facility], [address] [place], [place] [address]    Special Requests   Final    NONE Reflexed from [id number] Performed at [facility], [address] [place], [place] [address]    Gram Stain   Final    RARE WBC SEEN RARE VONNE POSITIVE RODS RARE VONNE POSITIVE COCCI RARE GRAM NEGATIVE RODS    Culture   Final    FEW Normal respiratory flora-no Staph aureus or Pseudomonas seen Performed at [facility], [address], [place], [place] [address]    Report Status 10/07/2023 FINAL  Final  MRSA Next Gen by PCR, Nasal     Status: None   Collection Time: 10/06/23 12:57 AM   Specimen: Nasal Mucosa; Nasal Swab  Result Value Ref Range Status   MRSA by PCR Next Gen NOT DETECTED NOT DETECTED Final    Comment: (NOTE) The GeneXpert MRSA Assay (FDA approved for NASAL specimens only), is one component of a comprehensive MRSA colonization surveillance program. It is not intended to diagnose MRSA infection nor to guide or monitor treatment for MRSA infections. Test performance is not FDA approved in patients less than 61 years old. Performed at [facility], [address] [place], [place] [address]   Culture, Respiratory w Gram Stain     Status: None   Collection Time: 10/06/23 11:37 AM   Specimen: INDUCED SPUTUM  Result Value Ref Range Status   Specimen Description   Final    INDUCED SPUTUM Performed at [facility], [address] [place], [place] [address]    Special Requests   Final    NONE Performed at [facility] [address] [place], [place] [address]    Gram Stain   Final    FEW WBC PRESENT,BOTH PMN AND MONONUCLEAR FEW GRAM POSITIVE RODS    Culture   Final    MODERATE LACTOBACILLUS FERMENTUM Standardized susceptibility testing for this organism is not available. Performed at [facility], [address], [place], [place] [address]    Report Status 10/09/2023 FINAL  Final  Culture, blood (x 2)     Status: None   Collection Time:  10/22/23  1:00 AM   Specimen: BLOOD  Result Value Ref Range Status   Specimen Description BLOOD BLOOD RIGHT ARM  Final   Special Requests   Final    BOTTLES DRAWN AEROBIC AND ANAEROBIC Blood Culture adequate volume   Culture   Final    NO GROWTH 5 DAYS Performed at [facility], [address] [place], [place] [address]    Report Status 10/27/2023 FINAL  Final  Culture, blood (x 2)     Status: None   Collection Time: 10/22/23  1:00 AM   Specimen: BLOOD  Result Value Ref Range Status   Specimen Description BLOOD BLOOD RIGHT ARM  Final   Special Requests   Final    BOTTLES DRAWN AEROBIC AND ANAEROBIC Blood Culture adequate volume   Culture   Final    NO GROWTH 5 DAYS Performed at [facility], [address] [place], [place] [address]    Report Status 10/27/2023 FINAL  Final  Resp panel by RT-PCR (RSV, Flu A&B, Covid) Anterior Nasal Swab     Status: None   Collection Time: 11/09/23  5:31 PM   Specimen: Anterior Nasal Swab  Result Value Ref Range Status   SARS Coronavirus 2 by RT PCR NEGATIVE NEGATIVE Final    Comment: (NOTE) SARS-CoV-2 target nucleic acids are NOT DETECTED.  The SARS-CoV-2 RNA is generally detectable in upper respiratory specimens during the acute phase of infection. The lowest concentration of SARS-CoV-2 viral copies this assay can detect is 138 copies/mL. A negative result does not preclude SARS-Cov-2 infection and should not be used as the sole basis for treatment or other patient management decisions. A negative result may occur with  improper specimen collection/handling, submission of specimen other than nasopharyngeal swab, presence of viral mutation(s) within the areas targeted by this assay, and inadequate number of viral copies(<138 copies/mL). A negative result must be combined with  clinical observations, patient history, and epidemiological information. The expected result is Negative.  Fact Sheet for Patients:   BloggerCourse.com  Fact Sheet for Healthcare Providers:  SeriousBroker.it  This test is no t yet approved or cleared by the [place]  FDA and  has been authorized for detection and/or diagnosis of SARS-CoV-2 by FDA under an Emergency Use Authorization (EUA). This EUA will remain  in effect (meaning this test can be used) for the duration of the COVID-19 declaration under Section 564(b)(1) of the Act, 21 U.S.C.section 360bbb-3(b)(1), unless the authorization is terminated  or revoked sooner.       Influenza A by PCR NEGATIVE NEGATIVE Final   Influenza B by PCR NEGATIVE NEGATIVE Final    Comment: (NOTE) The Xpert Xpress SARS-CoV-2/FLU/RSV plus assay is intended as an aid in the diagnosis of influenza from Nasopharyngeal swab specimens and should not be used as a sole basis for treatment. Nasal washings and aspirates are unacceptable for Xpert Xpress SARS-CoV-2/FLU/RSV testing.  Fact Sheet for Patients: BloggerCourse.com  Fact Sheet for Healthcare Providers: SeriousBroker.it  This test is not yet approved or cleared by the [place]  FDA and has been authorized for detection and/or diagnosis of SARS-CoV-2 by FDA under an Emergency Use Authorization (EUA). This EUA will remain in effect (meaning this test can be used) for the duration of the COVID-19 declaration under Section 564(b)(1) of the Act, 21 U.S.C. section 360bbb-3(b)(1), unless the authorization is terminated or revoked.     Resp Syncytial Virus by PCR NEGATIVE NEGATIVE Final    Comment: (NOTE) Fact Sheet for Patients: BloggerCourse.com  Fact Sheet for Healthcare Providers: SeriousBroker.it  This test is not yet approved or cleared by the [place]  FDA and has been authorized for detection and/or diagnosis of SARS-CoV-2 by FDA under an Emergency Use  Authorization (EUA). This EUA will remain in effect (meaning this test can be used) for the duration of the COVID-19 declaration under Section 564(b)(1) of the Act, 21 U.S.C. section 360bbb-3(b)(1), unless the authorization is terminated or revoked.  Performed at [facility], [address] [place], [place] [address]   Respiratory (~20 pathogens) panel by PCR     Status: None   Collection Time: 11/09/23  5:31 PM   Specimen: Nasopharyngeal Swab; Respiratory  Result Value Ref Range Status   Adenovirus NOT DETECTED NOT DETECTED Final   Coronavirus 229E NOT DETECTED NOT DETECTED Final    Comment: (NOTE) The Coronavirus on the Respiratory Panel, DOES NOT test for the novel  Coronavirus (2019 nCoV)    Coronavirus HKU1 NOT DETECTED NOT DETECTED Final   Coronavirus NL63 NOT DETECTED NOT DETECTED Final   Coronavirus OC43 NOT DETECTED NOT DETECTED Final   Metapneumovirus NOT DETECTED NOT DETECTED Final   Rhinovirus / Enterovirus NOT DETECTED NOT DETECTED Final   Influenza A NOT DETECTED NOT DETECTED Final   Influenza B NOT DETECTED NOT DETECTED Final   Parainfluenza Virus 1 NOT DETECTED NOT DETECTED Final   Parainfluenza Virus 2 NOT DETECTED NOT DETECTED Final   Parainfluenza Virus 3 NOT DETECTED NOT DETECTED Final   Parainfluenza Virus 4 NOT DETECTED NOT DETECTED Final   Respiratory Syncytial Virus NOT DETECTED NOT DETECTED Final   Bordetella pertussis NOT DETECTED NOT DETECTED Final   Bordetella Parapertussis NOT DETECTED NOT DETECTED Final   Chlamydophila pneumoniae NOT DETECTED NOT DETECTED Final   Mycoplasma pneumoniae NOT DETECTED NOT DETECTED Final    Comment: Performed at [facility], [address], [place], [place] [address]  Culture, blood (Routine X 2) w Reflex to ID Panel     Status: None   Collection Time: 11/09/23  6:16 PM   Specimen: BLOOD  Result Value Ref Range Status   Specimen Description   Final    BLOOD RIGHT ANTECUBITAL Performed at [facility], [address] [place], [place] [address]    Special Requests   Final    BOTTLES DRAWN AEROBIC ONLY Blood Culture adequate volume Performed at [facility], [address], [place], [place] [address]    Culture   Final    NO GROWTH 11 DAYS Performed at [facility], [address], [place], [place] [address]    Report Status 11/20/2023 FINAL  Final  Culture, blood (Routine X 2) w Reflex to ID Panel     Status: None   Collection Time: 11/09/23  6:23 PM   Specimen: BLOOD  Result Value Ref Range Status   Specimen Description   Final    BLOOD BLOOD LEFT FOREARM Performed at [facility], [address] [place], [place] [address]    Special Requests   Final    BOTTLES DRAWN AEROBIC AND ANAEROBIC Blood Culture adequate volume Performed at [facility], [address], [place], [place] [address]    Culture   Final    NO GROWTH 11 DAYS Performed at [facility], [address] [place], [place] [address]    Report Status 11/20/2023 FINAL  Final  Urine Culture     Status: Abnormal   Collection Time: 11/10/23  1:58 AM   Specimen: Urine, Random  Result Value Ref Range Status   Specimen Description   Final    URINE, RANDOM Performed at [facility], [address] [place], [place] [address]    Special Requests   Final    NONE Reflexed from [id number] Performed at [facility], [address] [place], [place] [address]    Culture 60,000 COLONIES/mL ENTEROCOCCUS FAECALIS (A)  Final   Report Status 11/12/2023 FINAL  Final   Organism ID, Bacteria ENTEROCOCCUS FAECALIS (A)  Final      Susceptibility   Enterococcus faecalis - MIC*    AMPICILLIN  <=2 SENSITIVE Sensitive     NITROFURANTOIN <=16 SENSITIVE Sensitive     VANCOMYCIN  1 SENSITIVE Sensitive     * 60,000 COLONIES/mL ENTEROCOCCUS FAECALIS  Culture, blood (x 2)     Status: None   Collection Time: 11/25/23 10:31 PM   Specimen: BLOOD  Result Value Ref  Range Status   Specimen Description BLOOD BLOOD LEFT ARM  Final   Special Requests   Final    BOTTLES DRAWN AEROBIC AND ANAEROBIC Blood Culture adequate volume   Culture   Final    NO GROWTH 5 DAYS Performed at [facility], [address] [place], [place] [address]    Report Status 11/30/2023 FINAL  Final  Culture, blood (x 2)     Status: None   Collection Time: 11/25/23 10:31 PM   Specimen: BLOOD  Result Value Ref Range Status   Specimen Description BLOOD BLOOD RIGHT ARM  Final   Special Requests   Final    BOTTLES DRAWN AEROBIC AND ANAEROBIC Blood Culture adequate volume   Culture   Final    NO GROWTH 5 DAYS Performed at [facility], [address], [place], [place] [address]    Report Status 11/30/2023 FINAL  Final  Gastrointestinal Panel by PCR , Stool  Status: None   Collection Time: 11/30/23  5:10 PM   Specimen: Stool  Result Value Ref Range Status   Campylobacter species NOT DETECTED NOT DETECTED Final   Plesimonas shigelloides NOT DETECTED NOT DETECTED Final   Salmonella species NOT DETECTED NOT DETECTED Final   Yersinia enterocolitica NOT DETECTED NOT DETECTED Final   Vibrio species NOT DETECTED NOT DETECTED Final   Vibrio cholerae NOT DETECTED NOT DETECTED Final   Enteroaggregative E coli (EAEC) NOT DETECTED NOT DETECTED Final   Enteropathogenic E coli (EPEC) NOT DETECTED NOT DETECTED Final   Enterotoxigenic E coli (ETEC) NOT DETECTED NOT DETECTED Final   Shiga like toxin producing E coli (STEC) NOT DETECTED NOT DETECTED Final   Shigella/Enteroinvasive E coli (EIEC) NOT DETECTED NOT DETECTED Final   Cryptosporidium NOT DETECTED NOT DETECTED Final   Cyclospora cayetanensis NOT DETECTED NOT DETECTED Final   Entamoeba histolytica NOT DETECTED NOT DETECTED Final   Giardia lamblia NOT DETECTED NOT DETECTED Final   Adenovirus F40/41 NOT DETECTED NOT DETECTED Final   Astrovirus NOT DETECTED NOT DETECTED Final   Norovirus GI/GII NOT DETECTED NOT  DETECTED Final   Rotavirus A NOT DETECTED NOT DETECTED Final   Sapovirus (I, II, IV, and V) NOT DETECTED NOT DETECTED Final    Comment: Performed at [facility], [address] [place], [place] [address]    Coagulation Studies: No results for input(s): LABPROT, INR in the last 72 hours.   Urinalysis: No results for input(s): COLORURINE, LABSPEC, PHURINE, GLUCOSEU, HGBUR, BILIRUBINUR, KETONESUR, PROTEINUR, UROBILINOGEN, NITRITE, LEUKOCYTESUR in the last 72 hours.  Invalid input(s): APPERANCEUR     Imaging: No results found.        Medications:    albumin  human 25 g (12/09/23 1054)   piperacillin -tazobactam (ZOSYN )  IV 2.25 g (12/09/23 0538)    sodium chloride    Intravenous Once   acidophilus  1 capsule Oral Daily   Chlorhexidine  Gluconate Cloth  6 each Topical Q0600   epoetin  alfa-epbx (RETACRIT ) injection  10,000 Units Intravenous Q M,W,F-HD   feeding supplement (NEPRO CARB STEADY)  237 mL Oral 5 X Daily   feeding supplement (PROSource TF20)  60 mL Per Tube Daily   ferrous sulfate   325 mg Per Tube Daily   fiber supplement (BANATROL TF)  60 mL Per Tube BID   free water   120 mL Per Tube 5 X Daily   gentamicin  ointment   Topical TID   heparin  injection (subcutaneous)  5,000 Units Subcutaneous Q8H   insulin  aspart  10 Units Subcutaneous Once   midodrine   5 mg Per Tube TID WC   multivitamin  1 tablet Per Tube QHS   pantoprazole  (PROTONIX ) IV  40 mg Intravenous Q12H   polyethylene glycol  17 g Per Tube Daily   sennosides  5 mL Per Tube QHS   acetaminophen  **OR** acetaminophen , albumin  human, artificial tears, bisacodyl , ipratropium-albuterol , [DISCONTINUED] ondansetron  **OR** ondansetron  (ZOFRAN ) IV, mouth rinse  Assessment/ Plan:  Mr. Robert Vega is a 83 y.o.  male with obstructive sleep apnea, GERD, obesity, hypertension, dysphagia, severe esophageal dysmotility with achalasia status post PEG tube placement, recent  aspiration pneumonia acute respiratory failure status post extubation 10/09/2023, who was admitted to [facility] on 10/05/2023 for Aspiration into respiratory tract, initial encounter [T17.908A] Severe sepsis (HCC) [A41.9, R65.20] History of dysphagia [Z87.898] Fever, unspecified fever cause [R50.9] Multifocal pneumonia [J18.9]   1.  Acute kidney injury. Requiring hemodialysis. With obstructive uropathy Baseline creatinine 0.81 from 10/14/2023.  Acute kidney injury secondary  to ATN, obstructive uropathy, and progression of prerenal azotemia.   Patient is critically ill.  He was started on hemodialysis for severe hyperkalemia and uremia. Vascular surgery placed PermCath on 6/26.  - TOC dialysis coordinator notified of need for outpatient dialysis clinic.  Search remains in progress. -Patient receiving dialysis today, UF1.5L as tolerated.  - Next treatment scheduled for Thursday.  - Accepted at [facility] on a TTS schedule, chair time 7:10am.    2.  Sepsis and hypotension due to aspiration pneumonia, found to have distal esophageal obstruction with severe dysmotility.  He is status post botulinum toxin injection to the lower esophageal sphincter.  PEG tube placed this admission. Receiving continuous tube feeds with scheduled free water  flushes.  - Continue midodrine  - Continues to receive Zosyn   4.Anemia in the setting of renal failure Lab Results  Component Value Date   HGB 7.4 (L) 12/09/2023  - Continue IV EPO 10000 units with HD treatments.  -Hemoglobin 7.4   5.  Bilateral hydronephrosis Noted on CT.Urology team has evaluated the patient and it is thought to be secondary to reflux.  Due to health, further intervention deferred at this time.     LOS: 63 Nicki Gracy 7/14/202511:05 AM

## 2023-12-09 NOTE — Plan of Care (Signed)
  Problem: Nutrition: Goal: Adequate nutrition will be maintained Outcome: Progressing   Problem: Education: Goal: Knowledge of General Education information will improve Description: Including pain rating scale, medication(s)/side effects and non-pharmacologic comfort measures Outcome: Not Progressing   Problem: Health Behavior/Discharge Planning: Goal: Ability to manage health-related needs will improve Outcome: Not Progressing   Problem: Activity: Goal: Risk for activity intolerance will decrease Outcome: Not Progressing   

## 2023-12-09 NOTE — Progress Notes (Signed)
  Received patient in bed to unit.   Informed consent signed and in chart.    TX duration:3.30     Transported by  Hand-off given to patient's nurse.    Access used: Cath Access issues: Had to reverse lines at the end of tx. High arterial pressures.   Total UF removed: 1100 Medication(s) given: Albumin  25 ml, Epo 10,000u Post HD VS: wnl      GEANNIE Sar LPN [facility]

## 2023-12-09 NOTE — Progress Notes (Signed)
 Date of Admission:  10/05/2023      ID: Robert Vega is a 83 y.o. male Principal Problem:   Severe sepsis (HCC) Active Problems:   Encounter for dialysis and dialysis catheter care (HCC)   GERD (gastroesophageal reflux disease)   Dysphagia   Essential hypertension, benign   Multifocal pneumonia   Acute respiratory failure with hypoxia and hypercapnia (HCC)   History of dysphagia   Aspiration pneumonia (HCC)   Achalasia of esophagus   Acute delirium   Obesity (BMI 30-39.9)   Generalized weakness   Gastrostomy complication (HCC)   AKI (acute kidney injury) (HCC)   Generalized abdominal pain   Malnutrition of moderate degree   Melena   Leukocytosis   Perinephric hematoma   Hydronephrosis   5/10 admission for weakness, hypoxia- aspiration pneumonia due to achlasia  5/10 CT b/l infitrates- intubated- extubated 5/14 Dilated esophagus 5/13 PEG instertion by GI 5/20 repeat EGD botulinum injection 5/25 pulled PEG out 5/27 exp lap for dislodged peg in the abdominal cavity 6/60melena 6/10 EGD 6/16 HD cath- dialysis started 6/26 permanent HD cath  Antibiotic therapy Ceftriaxone  5/10-5/14 Azithro 5/10-5/11 Metronidazole  5/10 Zosyn  5/26-5/31 Cefepime  6/30-7/3 Zosyn  7/3 >>    Subjective: NA   Medications:   sodium chloride    Intravenous Once   acidophilus  1 capsule Oral Daily   Chlorhexidine  Gluconate Cloth  6 each Topical Q0600   epoetin  alfa-epbx (RETACRIT ) injection  10,000 Units Intravenous Q M,W,F-HD   feeding supplement (NEPRO CARB STEADY)  237 mL Oral 5 X Daily   feeding supplement (PROSource TF20)  60 mL Per Tube Daily   ferrous sulfate   325 mg Per Tube Daily   free water   120 mL Per Tube 5 X Daily   gentamicin  ointment   Topical TID   heparin  injection (subcutaneous)  5,000 Units Subcutaneous Q8H   insulin  aspart  10 Units Subcutaneous Once   midodrine   5 mg Per Tube TID WC   multivitamin  1 tablet Per Tube QHS   pantoprazole  (PROTONIX ) IV  40 mg  Intravenous Q12H    Objective: Vital signs in last 24 hours: Patient Vitals for the past 24 hrs:  BP Temp Temp src Pulse Resp SpO2  12/09/23 1230 124/62 -- -- 82 (!) 26 96 %  12/09/23 1227 (!) 149/75 -- -- 86 (!) 32 95 %  12/09/23 1200 135/71 -- -- 79 (!) 30 96 %  12/09/23 1130 133/74 -- -- 76 (!) 33 97 %  12/09/23 1100 (!) 121/59 -- -- 66 18 96 %  12/09/23 1030 127/70 -- -- 70 (!) 24 95 %  12/09/23 1000 130/70 -- -- 76 (!) 24 97 %  12/09/23 0930 (!) 140/73 -- -- 76 (!) 25 97 %  12/09/23 0925 136/67 -- -- 78 (!) 28 98 %  12/09/23 0911 (!) 135/98 98.2 F (36.8 C) Axillary 77 15 95 %  12/09/23 0810 -- 99.5 F (37.5 C) -- 80 -- 98 %  12/09/23 0803 (!) 129/58 -- -- 79 -- --  12/09/23 0345 111/65 99.1 F (37.3 C) -- 79 -- 97 %  12/08/23 2100 (!) 127/59 98.9 F (37.2 C) Axillary 75 18 97 %  12/08/23 1612 131/69 (!) 97.4 F (36.3 C) -- 75 18 97 %     LDA HD cath PHYSICAL EXAM:  General: lethargic, moaning as he is being shifted to his bed Eyes open and moving his upper extremities Did not respond when asked what his GD's name was Lungs:b/l air  entry- crepts scattered Heart: Regular rate and rhythm, no murmur, rub or gallop. Abdomen: Soft, peg Rectal tube CNS unable to assess Lab Results    Latest Ref Rng & Units 12/09/2023    5:53 AM 12/08/2023    5:29 AM 12/07/2023    6:40 AM  CBC  WBC 4.0 - 10.5 K/uL 14.8  14.6  13.1   Hemoglobin 13.0 - 17.0 g/dL 7.4  7.2  7.4   Hematocrit 39.0 - 52.0 % 23.4  24.0  24.9   Platelets 150 - 400 K/uL 410  408  454        Latest Ref Rng & Units 12/09/2023    5:53 AM 12/08/2023    5:29 AM 12/07/2023    6:40 AM  CMP  Glucose 70 - 99 mg/dL 95  895  877   BUN 8 - 23 mg/dL 86  62  41   Creatinine 0.61 - 1.24 mg/dL 2.83  4.54  5.80   Sodium 135 - 145 mmol/L 134  132  133   Potassium 3.5 - 5.1 mmol/L 4.1  3.8  3.4   Chloride 98 - 111 mmol/L 96  93  95   CO2 22 - 32 mmol/L 23  25  28    Calcium  8.9 - 10.3 mg/dL 7.9  7.9  7.9     Wbc  25.4>14.8  Microbiology: 5/10 BC NG 5/27 BC NG 6/14 BC- NG 6/30 BC NG Studies/Results:  B/l infitrates   Assessment/Plan:  Prolonged and Complicated hospital stay.  Encephalopathy  Aspiration pneumonia Hypoxia - intubated and then extubated Achalasia, dilated esophagus para esophageal hernia  PEG  Dislodgement and peritonitis- exp lap on 5/27  Leucocytosis- multifactorial- reactive to hydronephrosis, perinephric stranding - possible infection perinephric hematoma, aspiration pneumonia,  Been on antibiotics sine 6/30 - zosyn  started on 7/3 -plan is to give it for 2 weeks total improvement in leucocytosis- 25.4>13.1 And now a slow uptic- 14.8  B/l hydroureteronephrosis- unclear etiology- bladder wall thickening. Did he have BOO No calculus CT scan repeated showed proteinaceous hemorrhagic material in kidneys- Will he need perc nephrostomy?  Left perinephric hematoma ? etiology Urology following- if wbc worsens would need repeat imaging and stents VS PCN  AKI since 5/26 now on dialysis  Discussed the management with Urologist NP

## 2023-12-09 NOTE — Progress Notes (Signed)
 OT Cancellation Note  Patient Details Name: Robert Vega MRN: [id number] DOB: 11/29/1940   Cancelled Treatment:    Reason Eval/Treat Not Completed: Patient at procedure or test/ unavailable. Pt out of the room for dialysis. Will re-attempt at later date/time as pt is available and appropriate.   [name], MPH, MS, OTR/L ascom [phone] 12/09/23, 9:26 AM

## 2023-12-09 NOTE — Progress Notes (Signed)
 PROGRESS NOTE    Robert Vega  FMW:[id number] DOB: 22-Feb-1941 DOA: 10/05/2023 PCP: Jimmy Charlie FERNS, MD    Assessment & Plan:   Principal Problem:   Severe sepsis [facility]) Active Problems:   Gastrostomy complication (HCC)   Dysphagia   Achalasia of esophagus   AKI (acute kidney injury) (HCC)   Acute delirium   Acute respiratory failure with hypoxia and hypercapnia (HCC)   Essential hypertension, benign   GERD (gastroesophageal reflux disease)   Encounter for dialysis and dialysis catheter care [facility])   Multifocal pneumonia   History of dysphagia   Aspiration pneumonia (HCC)   Obesity (BMI 30-39.9)   Generalized weakness   Generalized abdominal pain   Malnutrition of moderate degree   Melena   Leukocytosis   Perinephric hematoma   Hydronephrosis  Assessment and Plan:  Failure to thrive: secondary to all below. Continue w/ full code, full scope of care as per pt's granddaughter. Poor prognosis. Palliative care recs apprec    SIRS: started on night on 11/25/23. W/ leukocytosis, tachypnea, fever and unknown source. Started on IV broad sprectrum abxs, IV flagyl , vanco, cefepime  initially. Continue on IV zosyn  for 10-14 days as per ID. WBC is trending back up again    AKI: secondary to ATN as per nephro. On HD. Can start outpatient HD on 12/12/23. Nephro following and recs apprec  Left perinephric hematoma: w/ b/l hydronephrosis & hydroureter. Etiology unclear. No b/l ureteral stents currently as per uro. No stone. No intervention currently as per uro. Uro recs apprec    Hyperkalemia: resolved   Possible ileus: resolved.    ACD: likely secondary to CKD. S/p 2 units of pRBCs transfused so far. Continue on iron supplement. H&H are labile. Will transfuse if Hb < 7.0     Achalasia & dysphagia: w/ high aspiration risk. S/p botulinum toxin injection on 5/20, several EGD and SLP evals. Not safe for PO intake unless family wants to accept risk that he will aspirate again. Continue  on tube feeds    Severe sepsis: resolved. See Dr. Lamount note on how pt met severe sepsis criteria. Initially w/ aspiration pna. Later in hospital stay w/ intra-abdominal infection secondary to PEG dislodged. Resolved    Acute hypoxic & hypercapnic respiratory failure: s/p intubation, ventilation & extubation. Continue on supplemental oxygen and wean as tolerated.    Chronic pEF CHF: XR shows interstitial edema/CHF. Fluid/volume management w/ HD    Delirium: vs mild cognitive impairment vs anoxic brain injury during critical illness in the ICU, requiring intubation & pressors. Mental status is poor & has made no significant improvement. Continue w/ supportive care. Granddaughter asked to d/c prn antipsychotics.   GERD: continue on PPI    HTN: BP is WNL. Continue on midodrine      Generalized weakness: PT/OT recs SNF   Obesity: BMI 32.8. Would benefit from weight loss   Moderate malnutrition: continue w/ tube feeds   DVT prophylaxis: heparin   Code Status: full Family Communication:  Disposition Plan: possibly d/c to SNF  Level of care: Med-Surg  Status is: Inpatient Remains inpatient appropriate because: severity of illness. Prognosis is poor. WBC is trending up again. No clinical improvement     Consultants:  ID ICU Nephro Uro   Procedures:   Antimicrobials: zosyn     Subjective: Pt is confused.   Objective: Vitals:   12/08/23 2100 12/09/23 0345 12/09/23 0803 12/09/23 0810  BP: (!) 127/59 111/65 (!) 129/58   Pulse: 75 79 79 80  Resp: 18  Temp: 98.9 F (37.2 C) 99.1 F (37.3 C)  99.5 F (37.5 C)  TempSrc: Axillary     SpO2: 97% 97%  98%  Weight:      Height:       No intake or output data in the 24 hours ending 12/09/23 0831   Filed Weights   11/29/23 0830 11/29/23 1230 12/02/23 1726  Weight: 96.8 kg 95.3 kg 94.3 kg    Examination:  General exam: appears confused & uncomfortable  Respiratory system: decreased breath sounds b/l   Gastrointestinal system: abd is soft, NT, obese & hyperactive bowel sounds Central nervous system: lethargic  Psychiatry: judgement and insight appears poor    Data Reviewed: I have personally reviewed following labs and imaging studies  CBC: Recent Labs  Lab 12/03/23 0959 12/05/23 0522 12/06/23 0415 12/07/23 0640 12/08/23 0529 12/09/23 0553  WBC 18.8* 17.1* 15.7* 13.1* 14.6* 14.8*  NEUTROABS 13.8*  --   --   --   --   --   HGB 7.6* 7.3* 7.4* 7.4* 7.2* 7.4*  HCT 24.6* 23.6* 24.1* 24.9* 24.0* 23.4*  MCV 93.2 91.1 92.0 92.9 92.3 90.0  PLT 468* 451* 482* 454* 408* 410*   Basic Metabolic Panel: Recent Labs  Lab 12/03/23 0959 12/05/23 0522 12/06/23 0415 12/07/23 0640 12/08/23 0529 12/09/23 0553  NA 136 132* 134* 133* 132* 134*  K 3.7 3.4* 3.6 3.4* 3.8 4.1  CL 97* 93* 93* 95* 93* 96*  CO2 26 26 26 28 25 23   GLUCOSE 117* 124* 120* 122* 104* 95  BUN 65* 44* 63* 41* 62* 86*  CREATININE 5.21* 4.47* 5.96* 4.19* 5.45* 7.16*  CALCIUM  7.8* 8.0* 7.7* 7.9* 7.9* 7.9*  PHOS 5.7*  --   --   --   --   --    GFR: Estimated Creatinine Clearance: 8.7 mL/min (A) (by C-G formula based on SCr of 7.16 mg/dL (H)). Liver Function Tests: Recent Labs  Lab 12/03/23 0959  ALBUMIN  1.8*   No results for input(s): LIPASE, AMYLASE in the last 168 hours. No results for input(s): AMMONIA in the last 168 hours. Coagulation Profile: No results for input(s): INR, PROTIME in the last 168 hours. Cardiac Enzymes: No results for input(s): CKTOTAL, CKMB, CKMBINDEX, TROPONINI in the last 168 hours. BNP (last 3 results) No results for input(s): PROBNP in the last 8760 hours. HbA1C: No results for input(s): HGBA1C in the last 72 hours. CBG: Recent Labs  Lab 12/07/23 1555 12/08/23 0820 12/08/23 1116 12/08/23 1729 12/09/23 0812  GLUCAP 121* 110* 110* 126* 102*   Lipid Profile: No results for input(s): CHOL, HDL, LDLCALC, TRIG, CHOLHDL, LDLDIRECT in the last 72  hours. Thyroid  Function Tests: No results for input(s): TSH, T4TOTAL, FREET4, T3FREE, THYROIDAB in the last 72 hours. Anemia Panel: No results for input(s): VITAMINB12, FOLATE, FERRITIN, TIBC, IRON, RETICCTPCT in the last 72 hours. Sepsis Labs: No results for input(s): PROCALCITON, LATICACIDVEN in the last 168 hours.  Recent Results (from the past 240 hours)  Gastrointestinal Panel by PCR , Stool     Status: None   Collection Time: 11/30/23  5:10 PM   Specimen: Stool  Result Value Ref Range Status   Campylobacter species NOT DETECTED NOT DETECTED Final   Plesimonas shigelloides NOT DETECTED NOT DETECTED Final   Salmonella species NOT DETECTED NOT DETECTED Final   Yersinia enterocolitica NOT DETECTED NOT DETECTED Final   Vibrio species NOT DETECTED NOT DETECTED Final   Vibrio cholerae NOT DETECTED NOT DETECTED Final   Enteroaggregative E  coli (EAEC) NOT DETECTED NOT DETECTED Final   Enteropathogenic E coli (EPEC) NOT DETECTED NOT DETECTED Final   Enterotoxigenic E coli (ETEC) NOT DETECTED NOT DETECTED Final   Shiga like toxin producing E coli (STEC) NOT DETECTED NOT DETECTED Final   Shigella/Enteroinvasive E coli (EIEC) NOT DETECTED NOT DETECTED Final   Cryptosporidium NOT DETECTED NOT DETECTED Final   Cyclospora cayetanensis NOT DETECTED NOT DETECTED Final   Entamoeba histolytica NOT DETECTED NOT DETECTED Final   Giardia lamblia NOT DETECTED NOT DETECTED Final   Adenovirus F40/41 NOT DETECTED NOT DETECTED Final   Astrovirus NOT DETECTED NOT DETECTED Final   Norovirus GI/GII NOT DETECTED NOT DETECTED Final   Rotavirus A NOT DETECTED NOT DETECTED Final   Sapovirus (I, II, IV, and V) NOT DETECTED NOT DETECTED Final    Comment: Performed at [facility], [address], [place], [place] [address]         Radiology Studies: No results found.       Scheduled Meds:  sodium chloride    Intravenous Once   acidophilus  1 capsule Oral Daily    Chlorhexidine  Gluconate Cloth  6 each Topical Q0600   epoetin  alfa-epbx (RETACRIT ) injection  10,000 Units Intravenous Q M,W,F-HD   feeding supplement (NEPRO CARB STEADY)  237 mL Oral 5 X Daily   feeding supplement (PROSource TF20)  60 mL Per Tube Daily   ferrous sulfate   325 mg Per Tube Daily   fiber supplement (BANATROL TF)  60 mL Per Tube BID   free water   120 mL Per Tube 5 X Daily   gentamicin  ointment   Topical TID   heparin  injection (subcutaneous)  5,000 Units Subcutaneous Q8H   insulin  aspart  10 Units Subcutaneous Once   midodrine   5 mg Per Tube TID WC   multivitamin  1 tablet Per Tube QHS   pantoprazole  (PROTONIX ) IV  40 mg Intravenous Q12H   polyethylene glycol  17 g Per Tube Daily   sennosides  5 mL Per Tube QHS   Continuous Infusions:  albumin  human 25 g (12/04/23 1630)   piperacillin -tazobactam (ZOSYN )  IV 2.25 g (12/09/23 0538)     LOS: 65 days      Anthony CHRISTELLA Pouch, MD [facility] Pager [phone]  If 7PM-7AM, please contact night-coverage [email or web address] 12/09/2023, 8:31 AM

## 2023-12-09 NOTE — Progress Notes (Signed)
 Nutrition Follow Up Note   DOCUMENTATION CODES:   Non-severe (moderate) malnutrition in context of acute illness/injury  INTERVENTION:   Nepro- Give one carton five times daily via tube- Flush with 60ml of water  before and after each feed.   ProSource TF 20- Give 60ml daily via tube, each supplement provides 80kcal and 20g of protein.   Regimen provides 2180kcal/day, 115g/day protein and 1425ml/day of free water .   Rena-vit daily via tube  Discontinue Banatrol   Daily weights   NUTRITION DIAGNOSIS:   Moderate Malnutrition related to acute illness as evidenced by mild fat depletion, mild muscle depletion, moderate muscle depletion, percent weight loss. -ongoing   GOAL:   Patient will meet greater than or equal to 90% of their needs -met  MONITOR:   Diet advancement, Labs, Weight trends, TF tolerance, I & O's, Skin  ASSESSMENT:   83 y/o male with h/o GERD, esophageal dysphagia, HTN, BPH, mood disorder and hiatal hernia who is admitted with aspiration event, PNA, sepsis, cardiac arrest and dysphagia.  Pt remains NPO with AMS. Pt continues to tolerate tube feeds at goal rate via G-tube. Pt with ongoing distension that has been noted on and off since admission. Pt continues to have diarrhea; per RN, pt had 5-6 episodes yesterday via rectal tube. Bowel regimen discontinued per MD. RD will discontinue Banatrol as pt receiving 40g of fiber daily between tube feeds and Banatrol and has not had any improvement in diarrhea. Soluble fiber may also be adding to pt's distension. RD unable to change the tube feed formula per request of pt's granddaughter. Pt is receiving probiotics. Pt remains on HD. Per chart, pt is down ~30lbs since admission. No new weight since 7/7. Palliative care following.     Medications reviewed and include: risaquad, epoetin , ferrous sulfate , heparin , insulin , midodrine , rena-vit, protonix , albumin , zosyn    Labs reviewed: Na 134(L), K 4.1 wnl, BUN 86(H), creat  7.16(H) Vitamin D  42.17- 7/7 Wbc- 14.8(H), Hgb 7.4(L), Hct 23.4(L) Cbgs- 102, 126, 110, 110 x 48 hrs   Diet Order:   Diet Order             Diet NPO time specified  Diet effective midnight                  EDUCATION NEEDS:   No education needs have been identified at this time  Skin:  Skin Assessment: Reviewed RN Assessment (closed abdominal incision, old skin tears)  Last BM:  7/13- type 7  Height:   Ht Readings from Last 1 Encounters:  10/08/23 5' 9.02 (1.753 m)    Weight:   Wt Readings from Last 1 Encounters:  12/02/23 94.3 kg    Ideal Body Weight:  72.7 kg  BMI:  Body mass index is 30.69 kg/m.  Estimated Nutritional Needs:   Kcal:  2000-2300kcal/day  Protein:  100-115g/day  Fluid:  UOP +1L  Augustin Shams MS, RD, LDN If unable to be reached, please send secure chat to RD inpatient available from 8:00a-4:00p daily

## 2023-12-09 NOTE — Progress Notes (Signed)
 Urology Consult Follow Up  Subjective: Patient is only responding to squeezing arm.   He will give you eye contact, but he cannot give verbal response.    VSS afebrile.    Serum creatinine up to 7.16 from 5.45 yesterday   WBC count had lowered to 13.1 over the weekend, but it is starting to trend upwards again to 14.8   Hemoglobin steady at 7.4  He is anuric and receiving dialysis    Anti-infectives: Anti-infectives (From admission, onward)    Start     Dose/Rate Route Frequency Ordered Stop   12/06/23 1300  piperacillin -tazobactam (ZOSYN ) IVPB 2.25 g  Status:  Discontinued        2.25 g 100 mL/hr over 30 Minutes Intravenous Every 8 hours 12/06/23 1059 12/06/23 1128   12/06/23 1300  piperacillin -tazobactam (ZOSYN ) IVPB 2.25 g        2.25 g 100 mL/hr over 30 Minutes Intravenous Every 8 hours 12/06/23 1128     12/03/23 2000  piperacillin -tazobactam (ZOSYN ) IVPB 2.25 g        2.25 g 100 mL/hr over 30 Minutes Intravenous Every 8 hours 12/03/23 1956 12/06/23 0559   11/28/23 2200  piperacillin -tazobactam (ZOSYN ) IVPB 2.25 g        2.25 g 100 mL/hr over 30 Minutes Intravenous Every 8 hours 11/28/23 1501 12/03/23 1940   11/27/23 1200  vancomycin  (VANCOCIN ) IVPB 1000 mg/200 mL premix  Status:  Discontinued        1,000 mg 200 mL/hr over 60 Minutes Intravenous Every M-W-F (Hemodialysis) 11/25/23 2231 11/28/23 1501   11/25/23 2315  vancomycin  (VANCOCIN ) IVPB 1000 mg/200 mL premix       Placed in Followed by Linked Group   1,000 mg 200 mL/hr over 60 Minutes Intravenous  Once 11/25/23 2216 11/26/23 0013   11/25/23 2315  vancomycin  (VANCOREADY) IVPB 1250 mg/250 mL       Placed in Followed by Linked Group   1,250 mg 166.7 mL/hr over 90 Minutes Intravenous  Once 11/25/23 2216 11/26/23 0350   11/25/23 2300  metroNIDAZOLE  (FLAGYL ) IVPB 500 mg  Status:  Discontinued        500 mg 100 mL/hr over 60 Minutes Intravenous Every 12 hours 11/25/23 2210 11/28/23 1501   11/25/23 2300   vancomycin  (VANCOCIN ) IVPB 1000 mg/200 mL premix  Status:  Discontinued        1,000 mg 200 mL/hr over 60 Minutes Intravenous  Once 11/25/23 2210 11/25/23 2215   11/25/23 2230  ceFEPIme  (MAXIPIME ) 1 g in sodium chloride  0.9 % 100 mL IVPB  Status:  Discontinued        1 g 200 mL/hr over 30 Minutes Intravenous Every 24 hours 11/25/23 2210 11/28/23 1501   11/21/23 1400  ceFAZolin  (ANCEF ) IVPB 1 g/50 mL premix  Status:  Discontinued        1 g 100 mL/hr over 30 Minutes Intravenous 30 min pre-op 11/21/23 1027 11/21/23 1123   11/11/23 1815  Ampicillin -Sulbactam (UNASYN ) 3 g in sodium chloride  0.9 % 100 mL IVPB  Status:  Discontinued        3 g 200 mL/hr over 30 Minutes Intravenous Every 12 hours 11/11/23 1726 11/15/23 1431   11/10/23 1715  cefTRIAXone  (ROCEPHIN ) 1 g in sodium chloride  0.9 % 100 mL IVPB  Status:  Discontinued        1 g 200 mL/hr over 30 Minutes Intravenous Every 24 hours 11/10/23 1623 11/11/23 1725   10/21/23 2200  Ampicillin -Sulbactam (UNASYN ) 3 g in sodium chloride   0.9 % 100 mL IVPB  Status:  Discontinued        3 g 200 mL/hr over 30 Minutes Intravenous Every 6 hours 10/21/23 2011 10/21/23 2014   10/21/23 2200  piperacillin -tazobactam (ZOSYN ) IVPB 3.375 g        3.375 g 12.5 mL/hr over 240 Minutes Intravenous Every 8 hours 10/21/23 2019 10/26/23 2359   10/06/23 1600  cefTRIAXone  (ROCEPHIN ) 2 g in sodium chloride  0.9 % 100 mL IVPB        2 g 200 mL/hr over 30 Minutes Intravenous Every 24 hours 10/06/23 1415 10/09/23 1749   10/06/23 0600  piperacillin -tazobactam (ZOSYN ) IVPB 3.375 g  Status:  Discontinued        3.375 g 12.5 mL/hr over 240 Minutes Intravenous Every 8 hours 10/06/23 0356 10/06/23 1415   10/06/23 0500  cefTRIAXone  (ROCEPHIN ) 2 g in sodium chloride  0.9 % 100 mL IVPB  Status:  Discontinued        2 g 200 mL/hr over 30 Minutes Intravenous Every 24 hours 10/05/23 0750 10/06/23 0341   10/06/23 0500  azithromycin  (ZITHROMAX ) 500 mg in sodium chloride  0.9 % 250 mL  IVPB  Status:  Discontinued        500 mg 250 mL/hr over 60 Minutes Intravenous Every 24 hours 10/05/23 0750 10/06/23 1409   10/05/23 0615  metroNIDAZOLE  (FLAGYL ) IVPB 500 mg        500 mg 100 mL/hr over 60 Minutes Intravenous  Once 10/05/23 0611 10/05/23 0910   10/05/23 0545  cefTRIAXone  (ROCEPHIN ) 2 g in sodium chloride  0.9 % 100 mL IVPB        2 g 200 mL/hr over 30 Minutes Intravenous Once 10/05/23 0533 10/05/23 0650   10/05/23 0545  azithromycin  (ZITHROMAX ) 500 mg in sodium chloride  0.9 % 250 mL IVPB        500 mg 250 mL/hr over 60 Minutes Intravenous  Once 10/05/23 0533 10/05/23 0805       Current Facility-Administered Medications  Medication Dose Route Frequency Provider Last Rate Last Admin   0.9 %  sodium chloride  infusion (Manually program via Guardrails IV Fluids)   Intravenous Once Dew, Jason S, MD       acetaminophen  (TYLENOL ) tablet 650 mg  650 mg Oral Q6H PRN Dew, Jason S, MD   650 mg at 12/03/23 2206   Or   acetaminophen  (TYLENOL ) suppository 650 mg  650 mg Rectal Q6H PRN Dew, Jason S, MD       acidophilus (RISAQUAD) capsule 1 capsule  1 capsule Oral Daily Ravishankar, Donald, MD   1 capsule at 12/08/23 1048   albumin  human 25 % solution 25 g  25 g Intravenous Q dialysis Breeze, Faith, NP 60 mL/hr at 12/04/23 1630 25 g at 12/04/23 1630   artificial tears ophthalmic solution 1 drop  1 drop Both Eyes QID PRN Marea Selinda RAMAN, MD       bisacodyl  (DULCOLAX) suppository 10 mg  10 mg Rectal Daily PRN Dew, Jason S, MD   10 mg at 11/26/23 2259   Chlorhexidine  Gluconate Cloth 2 % PADS 6 each  6 each Topical Q0600 Levorn Ramonita SQUIBB, NP   6 each at 12/09/23 0537   epoetin  alfa-epbx (RETACRIT ) injection 10,000 Units  10,000 Units Intravenous Q M,W,F-HD Dew, Jason S, MD   10,000 Units at 12/06/23 1102   feeding supplement (NEPRO CARB STEADY) liquid 237 mL  237 mL Oral 5 X Daily Trudy Anthony HERO, MD   237 mL at 12/09/23 0541  feeding supplement (PROSource TF20) liquid 60 mL  60  mL Per Tube Daily Trudy Anthony HERO, MD   60 mL at 12/08/23 1048   ferrous sulfate  220 (44 Fe) MG/5ML solution 325 mg  325 mg Per Tube Daily Trudy Anthony HERO, MD   325 mg at 12/08/23 1048   fiber supplement (BANATROL TF) liquid 60 mL  60 mL Per Tube BID Agbata, Tochukwu, MD   60 mL at 12/08/23 2105   free water  120 mL  120 mL Per Tube 5 X Daily Trudy Anthony HERO, MD   120 mL at 12/09/23 9462   gentamicin  ointment (GARAMYCIN ) 0.1 %   Topical TID Dew, Jason S, MD   Given at 12/08/23 2106   heparin  injection 5,000 Units  5,000 Units Subcutaneous Q8H Dew, Jason S, MD   5,000 Units at 12/09/23 9462   insulin  aspart (novoLOG ) injection 10 Units  10 Units Subcutaneous Once Dew, Jason S, MD       ipratropium-albuterol  (DUONEB) 0.5-2.5 (3) MG/3ML nebulizer solution 3 mL  3 mL Nebulization Q4H PRN Dew, Jason S, MD   3 mL at 11/30/23 0527   midodrine  (PROAMATINE ) tablet 5 mg  5 mg Per Tube TID WC Trudy Anthony HERO, MD   5 mg at 12/08/23 1748   multivitamin (RENA-VIT) tablet 1 tablet  1 tablet Per Tube QHS Dew, Jason S, MD   1 tablet at 12/08/23 2102   ondansetron  (ZOFRAN ) injection 4 mg  4 mg Intravenous Q6H PRN Dew, Jason S, MD   4 mg at 10/05/23 1713   Oral care mouth rinse  15 mL Mouth Rinse PRN Marea Selinda RAMAN, MD       pantoprazole  (PROTONIX ) injection 40 mg  40 mg Intravenous Q12H Dew, Jason S, MD   40 mg at 12/08/23 2102   piperacillin -tazobactam (ZOSYN ) IVPB 2.25 g  2.25 g Intravenous Q8H Ravishankar, Donald, MD 100 mL/hr at 12/09/23 0538 2.25 g at 12/09/23 0538   polyethylene glycol (MIRALAX  / GLYCOLAX ) packet 17 g  17 g Per Tube Daily Trudy Anthony HERO, MD   17 g at 12/08/23 1050   sennosides (SENOKOT) 8.8 MG/5ML syrup 5 mL  5 mL Per Tube QHS Levorn Ramonita SQUIBB, NP         Objective: Vital signs in last 24 hours: Temp:  [97.4 F (36.3 C)-99.1 F (37.3 C)] 99.1 F (37.3 C) (07/14 0345) Pulse Rate:  [75-79] 79 (07/14 0345) Resp:  [16-18] 18 (07/13 2100) BP: (111-132)/(59-75) 111/65  (07/14 0345) SpO2:  [97 %] 97 % (07/14 0345)  Intake/Output from previous day: No intake/output data recorded. Intake/Output this shift: No intake/output data recorded.   Physical Exam Vitals and nursing note reviewed.  Constitutional:      Comments: Confused   HENT:     Head: Normocephalic.     Nose: Nose normal.     Mouth/Throat:     Mouth: Mucous membranes are moist.  Eyes:     Conjunctiva/sclera: Conjunctivae normal.  Pulmonary:     Effort: Pulmonary effort is normal.  Abdominal:     Palpations: Abdomen is soft.     Tenderness: There is no guarding.  Skin:    General: Skin is warm.  Neurological:     Mental Status: He is disoriented.  Psychiatric:     Comments: Somnolent this a.m., but would awaken with physical stimuli.  He would not respond verbally, but would look in my direction when I would state his name  Lab Results:  Recent Labs    12/08/23 0529 12/09/23 0553  WBC 14.6* 14.8*  HGB 7.2* 7.4*  HCT 24.0* 23.4*  PLT 408* 410*   BMET Recent Labs    12/08/23 0529 12/09/23 0553  NA 132* 134*  K 3.8 4.1  CL 93* 96*  CO2 25 23  GLUCOSE 104* 95  BUN 62* 86*  CREATININE 5.45* 7.16*  CALCIUM  7.9* 7.9*   PT/INR No results for input(s): LABPROT, INR in the last 72 hours. ABG No results for input(s): PHART, HCO3 in the last 72 hours.  Invalid input(s): PCO2, PO2  Studies/Results: No results found.   Assessment: 83 year old man with past medical history of hypertension, sleep apnea, obesity, GERD presenting with acute febrile hypoxia, sepsis and pneumonia with cardiac arrest with renal failure on dialysis, with image findings of chronic slightly progressive bilateral hydroureteronephrosis and a new left perinephric hematoma.  - spoke with his granddaughter [name] this am and plans are not to place ureteral stents today, so we will go ahead and resume feeds  - his granddaughter would like to see his WBC count trend downwards and her  grandfather's mental status improved before considering going under anesthesia - His granddaughter would also like to consult with infectious disease regarding the uptrending of his WBC count to get their recommendations, if ID feels the upward trend in his WBC count is not from a urinary source, she does not want to proceed with stent placement - would consider possible bilateral nephrostomy tubes with internalization of stents at a later date if ID is concerned about persisting infection from an urological source   Plan: - resume feeds  - Continue heparin , Zosyn , and hydration per primary, ID and nutrition teams - Trend CBC, BMP, vitals - will revisit once ID has given recommendations    LOS: 65 days    [facility] 12/09/2023

## 2023-12-10 DIAGNOSIS — G9340 Encephalopathy, unspecified: Secondary | ICD-10-CM | POA: Diagnosis not present

## 2023-12-10 DIAGNOSIS — J690 Pneumonitis due to inhalation of food and vomit: Secondary | ICD-10-CM | POA: Diagnosis not present

## 2023-12-10 DIAGNOSIS — Z87891 Personal history of nicotine dependence: Secondary | ICD-10-CM

## 2023-12-10 DIAGNOSIS — R627 Adult failure to thrive: Secondary | ICD-10-CM | POA: Diagnosis not present

## 2023-12-10 DIAGNOSIS — R0902 Hypoxemia: Secondary | ICD-10-CM | POA: Diagnosis not present

## 2023-12-10 DIAGNOSIS — R569 Unspecified convulsions: Secondary | ICD-10-CM

## 2023-12-10 DIAGNOSIS — R944 Abnormal results of kidney function studies: Secondary | ICD-10-CM

## 2023-12-10 DIAGNOSIS — K220 Achalasia of cardia: Secondary | ICD-10-CM | POA: Diagnosis not present

## 2023-12-10 LAB — GLUCOSE, CAPILLARY
Glucose-Capillary: 131 mg/dL — ABNORMAL HIGH (ref 70–99)
Glucose-Capillary: 131 mg/dL — ABNORMAL HIGH (ref 70–99)
Glucose-Capillary: 129 mg/dL — ABNORMAL HIGH (ref 70–99)

## 2023-12-10 LAB — BASIC METABOLIC PANEL WITH GFR
Anion gap: 11 (ref 5–15)
BUN: 49 mg/dL — ABNORMAL HIGH (ref 8–23)
CO2: 28 mmol/L (ref 22–32)
Calcium: 8.1 mg/dL — ABNORMAL LOW (ref 8.9–10.3)
Chloride: 95 mmol/L — ABNORMAL LOW (ref 98–111)
Creatinine, Ser: 4.46 mg/dL — ABNORMAL HIGH (ref 0.61–1.24)
GFR, Estimated: 12 mL/min — ABNORMAL LOW (ref >60)
Glucose, Bld: 103 mg/dL — ABNORMAL HIGH (ref 70–99)
Potassium: 3.8 mmol/L (ref 3.5–5.1)
Sodium: 134 mmol/L — ABNORMAL LOW (ref 135–145)

## 2023-12-10 LAB — HEPATIC FUNCTION PANEL
ALT: 17 U/L (ref 0–44)
AST: 24 U/L (ref 15–41)
Albumin: 2.1 g/dL — ABNORMAL LOW (ref 3.5–5.0)
Alkaline Phosphatase: 76 U/L (ref 38–126)
Bilirubin, Direct: <0.1 mg/dL (ref 0.0–0.2)
Total Bilirubin: 0.4 mg/dL (ref 0.0–1.2)
Total Protein: 7.7 g/dL (ref 6.5–8.1)

## 2023-12-10 LAB — HEMOGLOBIN AND HEMATOCRIT, BLOOD
HCT: 25.5 % — ABNORMAL LOW (ref 39.0–52.0)
Hemoglobin: 7.9 g/dL — ABNORMAL LOW (ref 13.0–17.0)

## 2023-12-10 LAB — CBC
HCT: 22.9 % — ABNORMAL LOW (ref 39.0–52.0)
Hemoglobin: 7 g/dL — ABNORMAL LOW (ref 13.0–17.0)
MCH: 28 pg (ref 26.0–34.0)
MCHC: 30.6 g/dL (ref 30.0–36.0)
MCV: 91.6 fL (ref 80.0–100.0)
Platelets: 374 K/uL (ref 150–400)
RBC: 2.5 MIL/uL — ABNORMAL LOW (ref 4.22–5.81)
RDW: 16.1 % — ABNORMAL HIGH (ref 11.5–15.5)
WBC: 15 K/uL — ABNORMAL HIGH (ref 4.0–10.5)
nRBC: 0 % (ref 0.0–0.2)

## 2023-12-10 LAB — TSH: TSH: 3.52 u[IU]/mL (ref 0.350–4.500)

## 2023-12-10 LAB — AMMONIA: Ammonia: 54 umol/L — ABNORMAL HIGH (ref 9–35)

## 2023-12-10 LAB — PREPARE RBC (CROSSMATCH)

## 2023-12-10 MED ORDER — SODIUM CHLORIDE 0.9% IV SOLUTION: Freq: Once | INTRAVENOUS | Status: AC

## 2023-12-10 NOTE — Consult Note (Signed)
 NEUROLOGY CONSULT NOTE   Date of service: December 10, 2023 Patient Name: Robert Vega MRN:  [id number] DOB:  03/18/1941 Chief Complaint: Altered mental status Requesting Provider: Trudy Anthony HERO, MD  History of Present Illness  Robert Vega is a 83 y.o. male with hx of achalasia who presented on 5/10 with aspiration and developed aspiration pneumonia in early May.  He was treated with antibiotics and had a PEG placed by GI.  He was started on hemodialysis in mid June due to AKI from obstructive uropathy and ATN.  He has had persistent encephalopathy.  He had thiamine  repletion starting 5/16.  TSH was 0.86 on admission.   LKW: *** Modified rankin score: {Modified Rankin Scale:21264} IV Thrombolysis: ***Yes, *** No (reason) EVT: ***Yes, *** No (reason) ICH Score:***  NIHSS components Score: Comment  1a Level of Conscious 0[]  1[]  2[]  3[]      1b LOC Questions 0[]  1[]  2[]       1c LOC Commands 0[]  1[]  2[]       2 Best Gaze 0[]  1[]  2[]       3 Visual 0[]  1[]  2[]  3[]      4 Facial Palsy 0[]  1[]  2[]  3[]      5a Motor Arm - left 0[]  1[]  2[]  3[]  4[]  UN[]    5b Motor Arm - Right 0[]  1[]  2[]  3[]  4[]  UN[]    6a Motor Leg - Left 0[]  1[]  2[]  3[]  4[]  UN[]    6b Motor Leg - Right 0[]  1[]  2[]  3[]  4[]  UN[]    7 Limb Ataxia 0[]  1[]  2[]  UN[]      8 Sensory 0[]  1[]  2[]  UN[]      9 Best Language 0[]  1[]  2[]  3[]      10 Dysarthria 0[]  1[]  2[]  UN[]      11 Extinct. and Inattention 0[]  1[]  2[]       TOTAL:       ROS  ***Comprehensive ROS performed and pertinent positives documented in HPI  ***Unable to ascertain due to ***  Past History   Past Medical History:  Diagnosis Date   Allergy    Arthritis    BPH (benign prostatic hypertrophy)    Colonic polyp    GERD (gastroesophageal reflux disease)    Has LPR   Hypertension     Past Surgical History:  Procedure Laterality Date   CATARACT EXTRACTION     2011, other in 2013   COLONOSCOPY  2011   CREATION, GASTROSTOMY, OPEN N/A 10/22/2023    Procedure: CREATION, GASTROSTOMY, OPEN; GASTROSTOMY CLOSURE;  Surgeon: Marinda Jayson KIDD, MD;  Location: [facility] ORS;  Service: General;  Laterality: N/A;   DIALYSIS/PERMA CATHETER INSERTION N/A 11/21/2023   Procedure: DIALYSIS/PERMA CATHETER INSERTION;  Surgeon: Marea Selinda RAMAN, MD;  Location: [facility] INVASIVE CV LAB;  Service: Cardiovascular;  Laterality: N/A;   ESOPHAGOGASTRODUODENOSCOPY N/A 10/15/2023   Procedure: EGD (ESOPHAGOGASTRODUODENOSCOPY);  Surgeon: Jinny Carmine, MD;  Location: [facility] ENDOSCOPY;  Service: Endoscopy;  Laterality: N/A;  WILL NEED BOTOX    ESOPHAGOGASTRODUODENOSCOPY N/A 11/05/2023   Procedure: EGD (ESOPHAGOGASTRODUODENOSCOPY);  Surgeon: Jinny Carmine, MD;  Location: [facility] ENDOSCOPY;  Service: Endoscopy;  Laterality: N/A;   ESOPHAGOGASTRODUODENOSCOPY (EGD) WITH PROPOFOL  N/A 08/30/2020   Procedure: ESOPHAGOGASTRODUODENOSCOPY (EGD) WITH PROPOFOL ;  Surgeon: Therisa Bi, MD;  Location: [facility] ENDOSCOPY;  Service: Gastroenterology;  Laterality: N/A;   ESOPHAGOGASTRODUODENOSCOPY (EGD) WITH PROPOFOL  N/A 04/02/2022   Procedure: ESOPHAGOGASTRODUODENOSCOPY (EGD) WITH PROPOFOL ;  Surgeon: Therisa Bi, MD;  Location: [facility] ENDOSCOPY;  Service: Gastroenterology;  Laterality: N/A;   LAPAROTOMY N/A 10/22/2023   Procedure: LAPAROTOMY, EXPLORATORY;  Surgeon: Marinda Jayson KIDD, MD;  Location: [facility] ORS;  Service: General;  Laterality: N/A;   PEG PLACEMENT N/A 10/08/2023   Procedure: INSERTION, PEG TUBE;  Surgeon: Jinny Carmine, MD;  Location: [facility] ENDOSCOPY;  Service: Endoscopy;  Laterality: N/A;   skin cancer removal     TEMPORARY DIALYSIS CATHETER N/A 11/11/2023   Procedure: TEMPORARY DIALYSIS CATHETER;  Surgeon: Marea Selinda RAMAN, MD;  Location: [facility] INVASIVE CV LAB;  Service: Cardiovascular;  Laterality: N/A;    Family History: Family History  Problem Relation Age of Onset   Hypertension Mother    Heart disease Neg Hx    Diabetes Neg Hx    Cancer Neg Hx     Social History  reports that he quit smoking about 9  years ago. His smoking use included pipe and cigarettes. He started smoking about 59 years ago. He has a 100 pack-year smoking history. He has been exposed to tobacco smoke. He has never used smokeless tobacco. He reports that he does not drink alcohol  and does not use drugs.  Allergies  Allergen Reactions   Sulfonamide Derivatives     Medications   Current Facility-Administered Medications:    0.9 %  sodium chloride  infusion (Manually program via Guardrails IV Fluids), , Intravenous, Once, Dew, Selinda RAMAN, MD   acetaminophen  (TYLENOL ) tablet 650 mg, 650 mg, Oral, Q6H PRN, 650 mg at 12/10/23 1815 **OR** acetaminophen  (TYLENOL ) suppository 650 mg, 650 mg, Rectal, Q6H PRN, Marea, Selinda RAMAN, MD   acidophilus (RISAQUAD) capsule 1 capsule, 1 capsule, Oral, Daily, Ravishankar, Jayashree, MD, 1 capsule at 12/10/23 1007   albumin  human 25 % solution 25 g, 25 g, Intravenous, Q dialysis, Breeze, Shantelle, NP, Last Rate: 60 mL/hr at 12/09/23 1054, 25 g at 12/09/23 1054   artificial tears ophthalmic solution 1 drop, 1 drop, Both Eyes, QID PRN, Marea, Selinda RAMAN, MD   bisacodyl  (DULCOLAX) suppository 10 mg, 10 mg, Rectal, Daily PRN, Marea Selinda RAMAN, MD, 10 mg at 11/26/23 2259   Chlorhexidine  Gluconate Cloth 2 % PADS 6 each, 6 each, Topical, Q0600, Levorn Ramonita SQUIBB, NP, 6 each at 12/10/23 0610   epoetin  alfa-epbx (RETACRIT ) injection 10,000 Units, 10,000 Units, Intravenous, Q M,W,F-HD, Marea Selinda RAMAN, MD, 10,000 Units at 12/09/23 1054   feeding supplement (NEPRO CARB STEADY) liquid 237 mL, 237 mL, Oral, 5 X Daily, Trudy, Jamiese M, MD, 237 mL at 12/10/23 1816   feeding supplement (PROSource TF20) liquid 60 mL, 60 mL, Per Tube, Daily, Trudy Anthony HERO, MD, 60 mL at 12/10/23 1007   ferrous sulfate  220 (44 Fe) MG/5ML solution 325 mg, 325 mg, Per Tube, Daily, Trudy Anthony HERO, MD, 325 mg at 12/10/23 1008   free water  120 mL, 120 mL, Per Tube, 5 X Daily, Trudy Anthony M, MD, 120 mL at 12/10/23 1817   gentamicin   ointment (GARAMYCIN ) 0.1 %, , Topical, TID, Dew, Selinda RAMAN, MD, Given at 12/10/23 1519   heparin  injection 5,000 Units, 5,000 Units, Subcutaneous, Q8H, Dew, Selinda RAMAN, MD, 5,000 Units at 12/10/23 1516   insulin  aspart (novoLOG ) injection 10 Units, 10 Units, Subcutaneous, Once, Dew, Selinda RAMAN, MD   ipratropium-albuterol  (DUONEB) 0.5-2.5 (3) MG/3ML nebulizer solution 3 mL, 3 mL, Nebulization, Q4H PRN, Marea Selinda RAMAN, MD, 3 mL at 11/30/23 9472   midodrine  (PROAMATINE ) tablet 5 mg, 5 mg, Per Tube, TID WC, Trudy Anthony HERO, MD, 5 mg at 12/10/23 1815   multivitamin (RENA-VIT) tablet 1 tablet, 1 tablet, Per Tube, QHS, Dew, Selinda RAMAN, MD, 1 tablet at  12-26-23 2149   [DISCONTINUED] ondansetron  (ZOFRAN ) tablet 4 mg, 4 mg, Oral, Q6H PRN **OR** ondansetron  (ZOFRAN ) injection 4 mg, 4 mg, Intravenous, Q6H PRN, Marea, Selinda RAMAN, MD, 4 mg at 10/05/23 1713   Oral care mouth rinse, 15 mL, Mouth Rinse, PRN, Dew, Selinda RAMAN, MD   pantoprazole  (PROTONIX ) injection 40 mg, 40 mg, Intravenous, Q12H, Dew, Selinda RAMAN, MD, 40 mg at 12/10/23 1007   sennosides (SENOKOT) 8.8 MG/5ML syrup 5 mL, 5 mL, Per Tube, QHS PRN, Trudy Anthony HERO, MD  Vitals   Vitals:   12/10/23 1637 12/10/23 1700 12/10/23 1800 12/10/23 1951  BP: 129/61 122/60  132/62  Pulse: 79 78  79  Resp: 18 (!) 28 (!) 24 20  Temp: 98.3 F (36.8 C) 97.8 F (36.6 C)  98.1 F (36.7 C)  TempSrc:  Axillary  Axillary  SpO2: 92% 95%  98%  Weight:      Height:        Body mass index is 30.69 kg/m.   Physical Exam   Constitutional: Appears elderly  Neurologic Examination    Neuro: Mental Status: Patient is lethargic, but with repeated stimulation I am able to get him to answer one or two words such as yes when I ask if what I am doing hurts as I am providing noxious stimulus.  The Cranial Nerves: II: He answers numbers when I asked him to count and visual Fields, but does not give correct number of fingers pupils are equal, round, and reactive to light.   III,IV,  VI: He resists eye opening, or checking doll's maneuver, does not cooperate with EOM VII: Facial movement is symmetric.  VIII: hearing is intact to voice X: Uvula elevates symmetrically XII: tongue is midline without atrophy or fasciculations.  Motor: He withdraws to noxious stimulation in all four extremities Sensory: As above  Cerebellar: Does not perform       Labs/Imaging/Neurodiagnostic studies   CBC:  Recent Labs  Lab 12/26/2023 0553 12/10/23 0334 12/10/23 2113  WBC 14.8* 15.0*  --   HGB 7.4* 7.0* 7.9*  HCT 23.4* 22.9* 25.5*  MCV 90.0 91.6  --   PLT 410* 374  --    Basic Metabolic Panel:  Lab Results  Component Value Date   NA 134 (L) 12/10/2023   K 3.8 12/10/2023   CO2 28 12/10/2023   GLUCOSE 103 (H) 12/10/2023   BUN 49 (H) 12/10/2023   CREATININE 4.46 (H) 12/10/2023   CALCIUM  8.1 (L) 12/10/2023   GFRNONAA 12 (L) 12/10/2023   GFRAA 108 04/17/2007   Lipid Panel:  Lab Results  Component Value Date   LDLCALC 117 (H) 04/14/2018   HgbA1c:  Lab Results  Component Value Date   HGBA1C 5.5 10/10/2023   Urine Drug Screen: No results found for: LABOPIA, COCAINSCRNUR, LABBENZ, AMPHETMU, THCU, LABBARB  Alcohol  Level No results found for: [facility] INR  Lab Results  Component Value Date   INR 1.2 11/25/2023   APTT  Lab Results  Component Value Date   APTT 41 (H) 11/25/2023   AED levels: No results found for: PHENYTOIN, ZONISAMIDE, LAMOTRIGINE, LEVETIRACETA  CT Head without contrast(Personally reviewed): ***  CT angio Head and Neck with contrast(Personally reviewed): ***  MR Angio head without contrast and Carotid Duplex BL(Personally reviewed): ***  MRI Brain(Personally reviewed): ***  Neurodiagnostics rEEG:  ***  ASSESSMENT   Robert Vega is a 83 y.o. male ***  RECOMMENDATIONS  *** ______________________________________________________________________    Bonney Aisha Seals, MD [facility]  Neurohospitalist

## 2023-12-10 NOTE — Plan of Care (Signed)
                                                     Palliative Care Progress Note   Patient Name: Robert Vega       Date: 12/10/2023 DOB: 1940/06/24  Age: 83 y.o. MRN#: [id number] Attending Physician: Trudy Anthony HERO, MD Primary Care Physician: Jimmy Charlie FERNS, MD Admit Date: 10/05/2023  Extensive chart review completed including labs, vital signs, imaging, progress notes, orders, and available advanced directive documents from current and previous encounters.   No change to plan or goals at this time.  Full code and full scope remain. Barrier to discharge is placement and TOC following closely.   PMT will continue to monitor the patient peripherally.  Thank you for allowing the Palliative Medicine Team to assist in the care of Robert Vega.  [name], DNP, FNP-BC Palliative Medicine Team    No charge

## 2023-12-10 NOTE — Progress Notes (Signed)
 Mobility Specialist - Progress Note   12/10/23 1531  Mobility  Activity Transferred from chair to bed;Turned to right side;Turned to left side;Turned to back - supine  Level of Assistance +2 (takes two people)  Assistive Device Sling  Mobility visit 1 Mobility  Mobility Specialist Start Time (ACUTE ONLY) 1439  Mobility Specialist Stop Time (ACUTE ONLY) 1512  Mobility Specialist Time Calculation (min) (ACUTE ONLY) 33 min     Pt transferred chair-bed via hoyer lift. Alarm set, needs in reach. Pt awake on entry and quickly asleep once back in bed.   Lennette Seip Mobility Specialist 12/10/23, 3:40 PM

## 2023-12-10 NOTE — Progress Notes (Signed)
 Physical Therapy Treatment Patient Details Name: RECO SHONK MRN: [id number] DOB: 12-31-1940 Today's Date: 12/10/2023   History of Present Illness 83 y.o male with significant PMH of OSA, GERD, Obesity, HTN, Dysphagia: EGD 03/2022 with food in upper esophagus complicated by aspiration event, cardiac arrest with round of CPR, and post resuscitation EGD with concern for lack of peristalsis. Pt presented to ED on 10/05/2023 with hypoxia, fever and generalized weakness; developed acute respiratory failure requiring intubation 5/10 due to aspiration pneumonia, extubated 5/15, but had significant agitation required brief course of Precedex ; pt now s/p exploratory laparotomy with closure of gastrotomy and insertion of gastrostomy tube on 10/22/23.  PT order discontinued by MD 11/10/23 and new PT consult received 11/14/23.  Pt s/p R temporary femoral vein dialysis catheter placement 11/11/23; now removed; pt now with HD perm cath.    PT Comments  Co-treat with OT performed this session. Today's tx involved PROM in extremities alongside consistent stimulation in attempt to improve pt participation. Pt able to respond this date to more stimulation with wet washcloth and tactile stimuli, but still needs consistent multimodal cueing to remain stimulated. Seated therex and PROM performed this date, pt occasionally able to participate minimally. Pt is progressing this date toward therapy goals but continues to be limited in participation d/t lethargy levels. PT to follow acutely as appropriate.     If plan is discharge home, recommend the following: Two people to help with walking and/or transfers;Two people to help with bathing/dressing/bathroom;Assistance with cooking/housework;Direct supervision/assist for medications management;Direct supervision/assist for financial management;Assist for transportation;Help with stairs or ramp for entrance;Supervision due to cognitive status   Can travel by private vehicle      No  Equipment Recommendations  None recommended by PT    Recommendations for Other Services       Precautions / Restrictions Precautions Precautions: Fall Recall of Precautions/Restrictions: Impaired Precaution/Restrictions Comments: PEG; perm cath (HD) Restrictions Weight Bearing Restrictions Per Provider Order: No Other Position/Activity Restrictions: Safety mitts donned     Mobility  Bed Mobility               General bed mobility comments: found with mobility speacialists transfering to recliner with hoyer lift    Transfers                   General transfer comment: found in recliner    Ambulation/Gait               General Gait Details: not appropriate at this time   Stairs             Wheelchair Mobility     Tilt Bed    Modified Rankin (Stroke Patients Only)       Balance Overall balance assessment: Needs assistance Sitting-balance support: Feet supported, Bilateral upper extremity supported Sitting balance-Leahy Scale: Zero Sitting balance - Comments: sitting in recliner with hoyer pad underneath him Postural control: Posterior lean                                  Communication Communication Communication: Impaired Factors Affecting Communication: Reduced clarity of speech;Difficulty expressing self  Cognition Arousal: Lethargic Behavior During Therapy: WFL for tasks assessed/performed, Flat affect   PT - Cognitive impairments: No family/caregiver present to determine baseline, Difficult to assess Difficult to assess due to: Impaired communication, Level of arousal Orientation impairments: Place, Time, Situation, Person  PT - Cognition Comments: pt more alert this date but remains lethargic and unable to formulate words. Able to say i how are you and continues to grunt throughout session Following commands: Impaired Following commands impaired: Follows one step commands  inconsistently    Cueing Cueing Techniques: Verbal cues, Gestural cues, Tactile cues, Visual cues  Exercises Other Exercises Other Exercises: sitting up in recliner x3 attempts with a max a x2 Other Exercises: LE PROM to increase flexibility and decrease risk of contractures, bilat knees and ankles, x5 Other Exercises: consistent stimulation attempts with wet washcloth, glasses, moving UEs, consistently calling out pt's name    General Comments        Pertinent Vitals/Pain Pain Assessment Pain Assessment: Faces Faces Pain Scale: Hurts little more    Home Living                          Prior Function            PT Goals (current goals can now be found in the care plan section) Acute Rehab PT Goals Patient Stated Goal: none stated PT Goal Formulation: Patient unable to participate in goal setting Time For Goal Achievement: 12/10/23 Potential to Achieve Goals: Fair Progress towards PT goals: Progressing toward goals    Frequency    Min 1X/week      PT Plan      Co-evaluation PT/OT/SLP Co-Evaluation/Treatment: Yes Reason for Co-Treatment: Necessary to address cognition/behavior during functional activity;For patient/therapist safety;To address functional/ADL transfers PT goals addressed during session: Strengthening/ROM;Balance        AM-PAC PT 6 Clicks Mobility   Outcome Measure  Help needed turning from your back to your side while in a flat bed without using bedrails?: Total Help needed moving from lying on your back to sitting on the side of a flat bed without using bedrails?: Total Help needed moving to and from a bed to a chair (including a wheelchair)?: Total Help needed standing up from a chair using your arms (e.g., wheelchair or bedside chair)?: Total Help needed to walk in hospital room?: Total Help needed climbing 3-5 steps with a railing? : Total 6 Click Score: 6    End of Session Equipment Utilized During Treatment:  Oxygen Activity Tolerance: Patient limited by lethargy Patient left: in chair;with chair alarm set Nurse Communication: Mobility status;Need for lift equipment PT Visit Diagnosis: Other abnormalities of gait and mobility (R26.89);Muscle weakness (generalized) (M62.81)     Time: 1040-1110 PT Time Calculation (min) (ACUTE ONLY): 30 min  Charges:                              Ladora Osterberg Romero-Perozo, SPT  12/10/2023, 11:30 AM

## 2023-12-10 NOTE — TOC Progression Note (Signed)
 Transition of Care [facility]) - Progression Note    Patient Details  Name: Robert Vega MRN: [id number] Date of Birth: 1940/08/13  Transition of [facility]) CM/SW Contact  [name], RN Phone Number: 12/10/2023, 2:29 PM  Clinical Narrative:     Granddaughter requesting MD and ID to call her for an update.  MD and ID notified  225 pm - received email from granddaughter requesting hospitalist scheduler contact information, and to request Dr Josette as on coming attending.   MD notified who states she has already discussed with granddaughter and that patient will be assigned to oncoming MD  Expected Discharge Plan: Skilled Nursing Facility Barriers to Discharge: Continued Medical Work up  Expected Discharge Plan and Services                                               Social Determinants of Health (SDOH) Interventions SDOH Screenings   Food Insecurity: Patient Unable To Answer (10/06/2023)  Recent Concern: Food Insecurity - Food Insecurity Present (09/11/2023)   Received from [facility]  Housing: Patient Unable To Answer (10/06/2023)  Recent Concern: Housing - High Risk (09/11/2023)   Received from [facility]  Transportation Needs: Patient Unable To Answer (10/06/2023)  Utilities: Patient Unable To Answer (10/06/2023)  Depression (PHQ2-9): Low Risk  (05/07/2023)  Financial Resource Strain: Medium Risk (09/11/2023)   Received from [facility]  Social Connections: Unknown (10/06/2023)  Tobacco Use: Medium Risk (11/05/2023)    Readmission Risk Interventions     No data to display

## 2023-12-10 NOTE — Plan of Care (Signed)

## 2023-12-10 NOTE — Progress Notes (Signed)
 Occupational Therapy Treatment Patient Details Name: Robert Vega MRN: [id number] DOB: Jan 31, 1941 Today's Date: 12/10/2023   History of present illness 83 y.o male with significant PMH of OSA, GERD, Obesity, HTN, Dysphagia: EGD 03/2022 with food in upper esophagus complicated by aspiration event, cardiac arrest with round of CPR, and post resuscitation EGD with concern for lack of peristalsis. Pt presented to ED on 10/05/2023 with hypoxia, fever and generalized weakness; developed acute respiratory failure requiring intubation 5/10 due to aspiration pneumonia, extubated 5/15, but had significant agitation required brief course of Precedex ; pt now s/p exploratory laparotomy with closure of gastrotomy and insertion of gastrostomy tube on 10/22/23.  PT order discontinued by MD 11/10/23 and new PT consult received 11/14/23.  Pt s/p R temporary femoral vein dialysis catheter placement 11/11/23; now removed; pt now with HD perm cath.   OT comments  Robert Vega seen for OT treatment on this date. Upon arrival to room pt being t/f from bed to chair via hoyer w/ mobility specialists. Before initial t/f, pt more alert than previous sessions, however, fatigues quickly. Pt provided multimodal cueing; responded to intermittent verbal cues, such as do you wear glasses by moving RUE to top of head, however, did not respond to tactile cues to wash his face with washcloth.   Pt required MAX A for oral care and did not respond to verbal cueing to open mouth or stick out his tongue. Pt required MAX A +2 physical assist to lean forward in chair x3. As noted below, improved participation and engagement this date. Pt making progress toward goals, will continue to follow POC. Discharge recommendation remains appropriate.        If plan is discharge home, recommend the following:  Two people to help with walking and/or transfers;Two people to help with bathing/dressing/bathroom   Equipment Recommendations  Other  (comment)    Recommendations for Other Services      Precautions / Restrictions Precautions Precautions: Fall Recall of Precautions/Restrictions: Impaired Precaution/Restrictions Comments: PEG; perm cath (HD) Restrictions Weight Bearing Restrictions Per Provider Order: No       Mobility Bed Mobility               General bed mobility comments: pt received in chair    Transfers                   General transfer comment: pt received in chair     Balance Overall balance assessment: Needs assistance Sitting-balance support: Bilateral upper extremity supported, Feet supported Sitting balance-Leahy Scale: Zero Sitting balance - Comments: pt seated in recliner                                   ADL either performed or assessed with clinical judgement   ADL Overall ADL's : Needs assistance/impaired                                       General ADL Comments: MAX A for seated grooming task and oral care    Extremity/Trunk Assessment Upper Extremity Assessment Upper Extremity Assessment: Generalized weakness   Lower Extremity Assessment Lower Extremity Assessment: Generalized weakness        Vision       Perception     Praxis     Communication Communication Communication: Impaired Factors Affecting Communication: Difficulty expressing self;Reduced  clarity of speech   Cognition Arousal: Lethargic Behavior During Therapy: WFL for tasks assessed/performed, Flat affect Cognition: Cognition impaired             OT - Cognition Comments: oriented to self, states name; pt said good morning                 Following commands: Impaired Following commands impaired: Follows one step commands inconsistently      Cueing   Cueing Techniques: Verbal cues, Gestural cues, Tactile cues  Exercises      Shoulder Instructions       General Comments      Pertinent Vitals/ Pain       Pain Assessment Pain  Assessment: Faces Faces Pain Scale: Hurts a little bit Pain Location: generalized/to touch Pain Descriptors / Indicators: Grimacing, Guarding Pain Intervention(s): Limited activity within patient's tolerance, Monitored during session, Repositioned  Home Living                                          Prior Functioning/Environment              Frequency  Min 1X/week        Progress Toward Goals  OT Goals(current goals can now be found in the care plan section)  Progress towards OT goals: Progressing toward goals  Acute Rehab OT Goals Patient Stated Goal: pt unable to state OT Goal Formulation: Patient unable to participate in goal setting Time For Goal Achievement: 12/24/23 Potential to Achieve Goals: Fair ADL Goals Pt Will Perform Grooming: with supervision;sitting Pt Will Perform Lower Body Dressing: with mod assist;with min assist;sit to/from stand Pt Will Transfer to Toilet: with mod assist;bedside commode;stand pivot transfer Pt Will Perform Toileting - Clothing Manipulation and hygiene: with min assist;sit to/from stand  Plan      Co-evaluation    PT/OT/SLP Co-Evaluation/Treatment: Yes Reason for Co-Treatment: Necessary to address cognition/behavior during functional activity;For patient/therapist safety;To address functional/ADL transfers PT goals addressed during session: Strengthening/ROM;Balance OT goals addressed during session: ADL's and self-care      AM-PAC OT 6 Clicks Daily Activity     Outcome Measure   Help from another person eating meals?: A Lot Help from another person taking care of personal grooming?: A Lot Help from another person toileting, which includes using toliet, bedpan, or urinal?: Total Help from another person bathing (including washing, rinsing, drying)?: Total Help from another person to put on and taking off regular upper body clothing?: A Lot Help from another person to put on and taking off regular lower  body clothing?: Total 6 Click Score: 9    End of Session Equipment Utilized During Treatment: Oxygen  OT Visit Diagnosis: Unsteadiness on feet (R26.81);Repeated falls (R29.6);Muscle weakness (generalized) (M62.81)   Activity Tolerance Patient limited by lethargy   Patient Left in chair;with call bell/phone within reach;with chair alarm set;with family/visitor present;Other (comment) (w/ mitts applied)   Nurse Communication          Time: 1040-1110 OT Time Calculation (min): 30 min  Charges: OT General Charges $OT Visit: 1 Visit OT Treatments $Self Care/Home Management : 8-22 mins  [place], Student OT   [employer] 12/10/2023, 1:41 PM

## 2023-12-10 NOTE — Progress Notes (Signed)
 PROGRESS NOTE   HPI was taken from Dr. Eldonna: Robert Vega is a 83 y.o. male with medical history significant of hypertension, sleep apnea, obesity, GERD presenting with acute febrile hypoxia, sepsis  and pneumonia.  Patient noted to be overall poor historian.  Per report, patient with increased work of breathing generalized weakness over the past 12 to 24 hours.  Was initially evaluated urgent care however EMS had to be called.  No severe weakness.  Mild cough per report.  No chest pain or abdominal pain.  No reported nausea or vomiting.  Seen by EMS with noted fever 100.7 and route.  Hypoxic to the mid 80s on room air. Presented to the ER Tmax 22.1, heart rate 90s, respirations mid 20s, BP 80s to 130s.  Requiring 4 L nasal cannula to keep O2 sats greater than 92%.  White count 7.7, hemoglobin 12, platelets 216, lactate 2.2.  COVID flu RSV negative.  Creatinine 1.  Glucose 152.  Chest x-ray with bilateral lower lobe pneumonia.  Positive pulmonary vascular congestion.  As per Dr. Trudy 7/9-7/15/25: Pt has made no clinical or mental improvement this week. Pt's granddaugter request neuro to see pt for pt's altered mental status. Very poor prognosis and likely pt will continue to decline. Palliative care saw pt again and pt's granddaughter still wants for full code and full scope of care despite pt's continued deterioration. High risk for further deterioration.     Robert Vega  FMW:[id number] DOB: 1940-06-07 DOA: 10/05/2023 PCP: Jimmy Charlie FERNS, MD    Assessment & Plan:   Principal Problem:   Severe sepsis [facility]) Active Problems:   Gastrostomy complication (HCC)   Dysphagia   Achalasia of esophagus   AKI (acute kidney injury) (HCC)   Acute delirium   Acute respiratory failure with hypoxia and hypercapnia (HCC)   Essential hypertension, benign   GERD (gastroesophageal reflux disease)   Encounter for dialysis and dialysis catheter care [facility])   Multifocal pneumonia   History of  dysphagia   Aspiration pneumonia (HCC)   Obesity (BMI 30-39.9)   Generalized weakness   Generalized abdominal pain   Malnutrition of moderate degree   Melena   Leukocytosis   Perinephric hematoma   Hydronephrosis  Assessment and Plan:  Failure to thrive: secondary to all below. Continue w/ full code, full scope of care as per pt's granddaughter. Poor prognosis and likely pt will continue to decline. Palliative care recs apprec    SIRS: started on night on 11/25/23. W/ leukocytosis, tachypnea, fever and unknown source. Started on IV broad sprectrum abxs, IV flagyl , vanco, cefepime  initially. Continue on IV zosyn  x 14 days as per ID. WBC is trending up again    AKI: secondary to ATN as per nephro. On HD. Can start outpatient HD on 12/12/23 but has not been able to sit up in chair for HD yet, lays in recliner so far. Nephro following and recs apprec  Left perinephric hematoma: w/ b/l hydronephrosis & hydroureter. Etiology unclear. No b/l ureteral stents currently as per uro. No stone. No intervention currently as per uro. Uro recs apprec    Hyperkalemia: resolved   Possible ileus: resolved.    ACD: likely secondary to CKD. S/p 2 units of pRBCs transfused so far. Will transfuse another 1 unit of pRBCs today. Continue on iron supplement     Achalasia & dysphagia: w/ high aspiration risk. S/p botulinum toxin injection on 5/20, several EGD and SLP evals. Not safe for PO intake unless family wants  to accept risk that he will aspirate again. Continue on tube feeds    Severe sepsis: resolved. See Dr. Lamount note on how pt met severe sepsis criteria. Initially w/ aspiration pna. Later in hospital stay w/ intra-abdominal infection secondary to PEG dislodged. Resolved    Acute hypoxic & hypercapnic respiratory failure: s/p intubation, ventilation & extubation. Continue on supplemental oxygen and wean as tolerated.    Chronic pEF CHF: XR shows interstitial edema/CHF. Fluid/volume management w/  HD    Delirium: vs mild cognitive impairment vs anoxic brain injury during critical illness in the ICU, requiring intubation & pressors.Mental status is poor and has made no clinical improvement. Pt's granddaughter requested neuro consult, Dr. Michaela consulted (neuro). Continue w/ supportive care. Granddaughter asked to d/c prn antipsychotics.   GERD: continue on PPI    HTN: BP is WNL. Continue on midodrine      Generalized weakness: PT/OT recs SNF    Obesity: BMI 32.8. Would benefit from weight loss   Moderate malnutrition: continue on tube feeds    DVT prophylaxis: heparin   Code Status: full Family Communication:  Disposition Plan: possibly d/c to SNF but unable to participate with therapy so likely pt's insurance will not pay for SNF   Level of care: Med-Surg  Status is: Inpatient Remains inpatient appropriate because: severity of illness. Prognosis is poor and high likelihood for further deteroriation. WBC is trending up again.    Consultants:  ID ICU Nephro Uro   Procedures:   Antimicrobials: zosyn     Subjective: Pt is lethargic.   Objective: Vitals:   12/09/23 1434 12/09/23 1654 12/09/23 2048 12/10/23 0213  BP: 137/72 (!) 121/58 126/64 (!) 141/68  Pulse: 86 80 79 75  Resp: 18 (!) 30 20 20   Temp: 98.6 F (37 C) 97.6 F (36.4 C) (!) 97.2 F (36.2 C) 98.1 F (36.7 C)  TempSrc:   Axillary Oral  SpO2: 95% 98% 96% 98%  Weight:      Height:        Intake/Output Summary (Last 24 hours) at 12/10/2023 0855 Last data filed at 12/10/2023 0612 Gross per 24 hour  Intake 50 ml  Output 1100 ml  Net -1050 ml     Filed Weights   11/29/23 0830 11/29/23 1230 12/02/23 1726  Weight: 96.8 kg 95.3 kg 94.3 kg    Examination:  General exam: appears uncomfortable  Respiratory system: diminished breath sounds b/l  Gastrointestinal system: abd is soft, NT, ND & hyperactive bowel sounds Central nervous system: lethargic. Psychiatry: judgement and insight appears  poor    Data Reviewed: I have personally reviewed following labs and imaging studies  CBC: Recent Labs  Lab 12/03/23 0959 12/05/23 0522 12/06/23 0415 12/07/23 0640 12/08/23 0529 12/09/23 0553 12/10/23 0334  WBC 18.8*   < > 15.7* 13.1* 14.6* 14.8* 15.0*  NEUTROABS 13.8*  --   --   --   --   --   --   HGB 7.6*   < > 7.4* 7.4* 7.2* 7.4* 7.0*  HCT 24.6*   < > 24.1* 24.9* 24.0* 23.4* 22.9*  MCV 93.2   < > 92.0 92.9 92.3 90.0 91.6  PLT 468*   < > 482* 454* 408* 410* 374   < > = values in this interval not displayed.   Basic Metabolic Panel: Recent Labs  Lab 12/03/23 0959 12/05/23 0522 12/06/23 0415 12/07/23 0640 12/08/23 0529 12/09/23 0553 12/10/23 0334  NA 136   < > 134* 133* 132* 134* 134*  K 3.7   < >  3.6 3.4* 3.8 4.1 3.8  CL 97*   < > 93* 95* 93* 96* 95*  CO2 26   < > 26 28 25 23 28   GLUCOSE 117*   < > 120* 122* 104* 95 103*  BUN 65*   < > 63* 41* 62* 86* 49*  CREATININE 5.21*   < > 5.96* 4.19* 5.45* 7.16* 4.46*  CALCIUM  7.8*   < > 7.7* 7.9* 7.9* 7.9* 8.1*  PHOS 5.7*  --   --   --   --   --   --    < > = values in this interval not displayed.   GFR: Estimated Creatinine Clearance: 14 mL/min (A) (by C-G formula based on SCr of 4.46 mg/dL (H)). Liver Function Tests: Recent Labs  Lab 12/03/23 0959  ALBUMIN  1.8*   No results for input(s): LIPASE, AMYLASE in the last 168 hours. No results for input(s): AMMONIA in the last 168 hours. Coagulation Profile: No results for input(s): INR, PROTIME in the last 168 hours. Cardiac Enzymes: No results for input(s): CKTOTAL, CKMB, CKMBINDEX, TROPONINI in the last 168 hours. BNP (last 3 results) No results for input(s): PROBNP in the last 8760 hours. HbA1C: No results for input(s): HGBA1C in the last 72 hours. CBG: Recent Labs  Lab 12/08/23 1729 12/09/23 0812 12/09/23 1223 12/09/23 1420 12/09/23 1658  GLUCAP 126* 102* 135* 106* 105*   Lipid Profile: No results for input(s): CHOL, HDL,  LDLCALC, TRIG, CHOLHDL, LDLDIRECT in the last 72 hours. Thyroid  Function Tests: No results for input(s): TSH, T4TOTAL, FREET4, T3FREE, THYROIDAB in the last 72 hours. Anemia Panel: No results for input(s): VITAMINB12, FOLATE, FERRITIN, TIBC, IRON, RETICCTPCT in the last 72 hours. Sepsis Labs: No results for input(s): PROCALCITON, LATICACIDVEN in the last 168 hours.  Recent Results (from the past 240 hours)  Gastrointestinal Panel by PCR , Stool     Status: None   Collection Time: 11/30/23  5:10 PM   Specimen: Stool  Result Value Ref Range Status   Campylobacter species NOT DETECTED NOT DETECTED Final   Plesimonas shigelloides NOT DETECTED NOT DETECTED Final   Salmonella species NOT DETECTED NOT DETECTED Final   Yersinia enterocolitica NOT DETECTED NOT DETECTED Final   Vibrio species NOT DETECTED NOT DETECTED Final   Vibrio cholerae NOT DETECTED NOT DETECTED Final   Enteroaggregative E coli (EAEC) NOT DETECTED NOT DETECTED Final   Enteropathogenic E coli (EPEC) NOT DETECTED NOT DETECTED Final   Enterotoxigenic E coli (ETEC) NOT DETECTED NOT DETECTED Final   Shiga like toxin producing E coli (STEC) NOT DETECTED NOT DETECTED Final   Shigella/Enteroinvasive E coli (EIEC) NOT DETECTED NOT DETECTED Final   Cryptosporidium NOT DETECTED NOT DETECTED Final   Cyclospora cayetanensis NOT DETECTED NOT DETECTED Final   Entamoeba histolytica NOT DETECTED NOT DETECTED Final   Giardia lamblia NOT DETECTED NOT DETECTED Final   Adenovirus F40/41 NOT DETECTED NOT DETECTED Final   Astrovirus NOT DETECTED NOT DETECTED Final   Norovirus GI/GII NOT DETECTED NOT DETECTED Final   Rotavirus A NOT DETECTED NOT DETECTED Final   Sapovirus (I, II, IV, and V) NOT DETECTED NOT DETECTED Final    Comment: Performed at [facility], [address] [place], [place] [address]         Radiology Studies: No results found.       Scheduled Meds:  sodium chloride     Intravenous Once   acidophilus  1 capsule Oral Daily   Chlorhexidine  Gluconate Cloth  6 each  Topical Q0600   epoetin  alfa-epbx (RETACRIT ) injection  10,000 Units Intravenous Q M,W,F-HD   feeding supplement (NEPRO CARB STEADY)  237 mL Oral 5 X Daily   feeding supplement (PROSource TF20)  60 mL Per Tube Daily   ferrous sulfate   325 mg Per Tube Daily   free water   120 mL Per Tube 5 X Daily   gentamicin  ointment   Topical TID   heparin  injection (subcutaneous)  5,000 Units Subcutaneous Q8H   insulin  aspart  10 Units Subcutaneous Once   midodrine   5 mg Per Tube TID WC   multivitamin  1 tablet Per Tube QHS   pantoprazole  (PROTONIX ) IV  40 mg Intravenous Q12H   Continuous Infusions:  albumin  human 25 g (12/09/23 1054)   piperacillin -tazobactam (ZOSYN )  IV 2.25 g (12/10/23 0655)     LOS: 66 days      Anthony CHRISTELLA Pouch, MD [facility] Pager [phone]  If 7PM-7AM, please contact night-coverage [email or web address] 12/10/2023, 8:55 AM

## 2023-12-10 NOTE — Progress Notes (Signed)
 [facility]  ROUNDING NOTE   Subjective:   Patient seen laying in bed Able to open eyes after multiple attempts at calling his name and shaking gently.  Nonverbal, stares at this provider before closing eyes Does not appear to be in pain  Objective:  Vital signs in last 24 hours:  Temp:  [97.2 F (36.2 C)-99.3 F (37.4 C)] 99.3 F (37.4 C) (07/15 0905) Pulse Rate:  [74-86] 78 (07/15 0905) Resp:  [18-32] 18 (07/15 0905) BP: (121-149)/(56-82) 124/56 (07/15 0905) SpO2:  [94 %-100 %] 100 % (07/15 0905)  Weight change:  Filed Weights   11/29/23 0830 11/29/23 1230 12/02/23 1726  Weight: 96.8 kg 95.3 kg 94.3 kg    Intake/Output: I/O last 3 completed shifts: In: 200 [Other:150; IV Piggyback:50] Out: 1100 [Other:1100]   Intake/Output this shift:  Total I/O In: 4740 [NG/GT:4740] Out: -   Physical Exam: General: Chronically ill appearing, seated in chair  Head: Oral mucosa moist  Eyes: Anicteric  Lungs:  Diminished  Heart: Regular rate and rhythm  Abdomen:  Peg tube in place, +distended  Extremities: no peripheral edema.  Neurologic: Arousable  Skin: No lesions  Access: Rt internal jugular permcath    Basic Metabolic Panel: Recent Labs  Lab 12/06/23 0415 12/07/23 0640 12/08/23 0529 12/09/23 0553 12/10/23 0334  NA 134* 133* 132* 134* 134*  K 3.6 3.4* 3.8 4.1 3.8  CL 93* 95* 93* 96* 95*  CO2 26 28 25 23 28   GLUCOSE 120* 122* 104* 95 103*  BUN 63* 41* 62* 86* 49*  CREATININE 5.96* 4.19* 5.45* 7.16* 4.46*  CALCIUM  7.7* 7.9* 7.9* 7.9* 8.1*    Liver Function Tests: No results for input(s): AST, ALT, ALKPHOS, BILITOT, PROT, ALBUMIN  in the last 168 hours.  No results for input(s): LIPASE, AMYLASE in the last 168 hours. No results for input(s): AMMONIA in the last 168 hours.  CBC: Recent Labs  Lab 12/06/23 0415 12/07/23 0640 12/08/23 0529 12/09/23 0553 12/10/23 0334  WBC 15.7* 13.1* 14.6* 14.8* 15.0*  HGB 7.4* 7.4* 7.2*  7.4* 7.0*  HCT 24.1* 24.9* 24.0* 23.4* 22.9*  MCV 92.0 92.9 92.3 90.0 91.6  PLT 482* 454* 408* 410* 374    Cardiac Enzymes: No results for input(s): CKTOTAL, CKMB, CKMBINDEX, TROPONINI in the last 168 hours.  BNP: Invalid input(s): POCBNP  CBG: Recent Labs  Lab 12/09/23 1223 12/09/23 1420 12/09/23 1658 12/10/23 0907 12/10/23 1132  GLUCAP 135* 106* 105* 131* 131*    Microbiology: Results for orders placed or performed during the hospital encounter of 10/05/23  Blood Culture (routine x 2)     Status: None   Collection Time: 10/05/23  5:06 AM   Specimen: BLOOD  Result Value Ref Range Status   Specimen Description BLOOD LA  Final   Special Requests   Final    BOTTLES DRAWN AEROBIC AND ANAEROBIC Blood Culture results may not be optimal due to an inadequate volume of blood received in culture bottles   Culture   Final    NO GROWTH 5 DAYS Performed at [facility], [address] [place], [place] [address]    Report Status 10/10/2023 FINAL  Final  Blood Culture (routine x 2)     Status: None   Collection Time: 10/05/23  5:07 AM   Specimen: BLOOD  Result Value Ref Range Status   Specimen Description BLOOD RA  Final   Special Requests   Final    BOTTLES DRAWN AEROBIC AND ANAEROBIC Blood Culture results may  not be optimal due to an inadequate volume of blood received in culture bottles   Culture   Final    NO GROWTH 5 DAYS Performed at [facility], [address] [place], [place] [address]    Report Status 10/10/2023 FINAL  Final  Resp panel by RT-PCR (RSV, Flu A&B, Covid) Anterior Nasal Swab     Status: None   Collection Time: 10/05/23  5:56 AM   Specimen: Anterior Nasal Swab  Result Value Ref Range Status   SARS Coronavirus 2 by RT PCR NEGATIVE NEGATIVE Final    Comment: (NOTE) SARS-CoV-2 target nucleic acids are NOT DETECTED.  The SARS-CoV-2 RNA is generally detectable in upper respiratory specimens during the acute phase of  infection. The lowest concentration of SARS-CoV-2 viral copies this assay can detect is 138 copies/mL. A negative result does not preclude SARS-Cov-2 infection and should not be used as the sole basis for treatment or other patient management decisions. A negative result may occur with  improper specimen collection/handling, submission of specimen other than nasopharyngeal swab, presence of viral mutation(s) within the areas targeted by this assay, and inadequate number of viral copies(<138 copies/mL). A negative result must be combined with clinical observations, patient history, and epidemiological information. The expected result is Negative.  Fact Sheet for Patients:  BloggerCourse.com  Fact Sheet for Healthcare Providers:  SeriousBroker.it  This test is no t yet approved or cleared by the [place]  FDA and  has been authorized for detection and/or diagnosis of SARS-CoV-2 by FDA under an Emergency Use Authorization (EUA). This EUA will remain  in effect (meaning this test can be used) for the duration of the COVID-19 declaration under Section 564(b)(1) of the Act, 21 U.S.C.section 360bbb-3(b)(1), unless the authorization is terminated  or revoked sooner.       Influenza A by PCR NEGATIVE NEGATIVE Final   Influenza B by PCR NEGATIVE NEGATIVE Final    Comment: (NOTE) The Xpert Xpress SARS-CoV-2/FLU/RSV plus assay is intended as an aid in the diagnosis of influenza from Nasopharyngeal swab specimens and should not be used as a sole basis for treatment. Nasal washings and aspirates are unacceptable for Xpert Xpress SARS-CoV-2/FLU/RSV testing.  Fact Sheet for Patients: BloggerCourse.com  Fact Sheet for Healthcare Providers: SeriousBroker.it  This test is not yet approved or cleared by the [place]  FDA and has been authorized for detection and/or diagnosis of SARS-CoV-2  by FDA under an Emergency Use Authorization (EUA). This EUA will remain in effect (meaning this test can be used) for the duration of the COVID-19 declaration under Section 564(b)(1) of the Act, 21 U.S.C. section 360bbb-3(b)(1), unless the authorization is terminated or revoked.     Resp Syncytial Virus by PCR NEGATIVE NEGATIVE Final    Comment: (NOTE) Fact Sheet for Patients: BloggerCourse.com  Fact Sheet for Healthcare Providers: SeriousBroker.it  This test is not yet approved or cleared by the [place]  FDA and has been authorized for detection and/or diagnosis of SARS-CoV-2 by FDA under an Emergency Use Authorization (EUA). This EUA will remain in effect (meaning this test can be used) for the duration of the COVID-19 declaration under Section 564(b)(1) of the Act, 21 U.S.C. section 360bbb-3(b)(1), unless the authorization is terminated or revoked.  Performed at [facility], [address] [place], [place] [address]   Respiratory (~20 pathogens) panel by PCR     Status: None   Collection Time: 10/05/23  8:11 AM   Specimen: Nasopharyngeal Swab; Respiratory  Result Value Ref Range  Status   Adenovirus NOT DETECTED NOT DETECTED Final   Coronavirus 229E NOT DETECTED NOT DETECTED Final    Comment: (NOTE) The Coronavirus on the Respiratory Panel, DOES NOT test for the novel  Coronavirus (2019 nCoV)    Coronavirus HKU1 NOT DETECTED NOT DETECTED Final   Coronavirus NL63 NOT DETECTED NOT DETECTED Final   Coronavirus OC43 NOT DETECTED NOT DETECTED Final   Metapneumovirus NOT DETECTED NOT DETECTED Final   Rhinovirus / Enterovirus NOT DETECTED NOT DETECTED Final   Influenza A NOT DETECTED NOT DETECTED Final   Influenza B NOT DETECTED NOT DETECTED Final   Parainfluenza Virus 1 NOT DETECTED NOT DETECTED Final   Parainfluenza Virus 2 NOT DETECTED NOT DETECTED Final   Parainfluenza Virus 3 NOT DETECTED NOT DETECTED  Final   Parainfluenza Virus 4 NOT DETECTED NOT DETECTED Final   Respiratory Syncytial Virus NOT DETECTED NOT DETECTED Final   Bordetella pertussis NOT DETECTED NOT DETECTED Final   Bordetella Parapertussis NOT DETECTED NOT DETECTED Final   Chlamydophila pneumoniae NOT DETECTED NOT DETECTED Final   Mycoplasma pneumoniae NOT DETECTED NOT DETECTED Final    Comment: Performed at [facility], [address] [place], [place] [address]  Expectorated Sputum Assessment w Gram Stain, Rflx to Resp Cult     Status: None   Collection Time: 10/05/23  9:07 AM   Specimen: Sputum  Result Value Ref Range Status   Specimen Description SPUTUM  Final   Special Requests NONE  Final   Sputum evaluation   Final    Sputum specimen not acceptable for testing.  Please recollect.   C/KERRY NELSON AT 1005 10/05/23.PMF Performed at [facility], [address] [place], [place] [address]    Report Status 10/05/2023 FINAL  Final  Expectorated Sputum Assessment w Gram Stain, Rflx to Resp Cult     Status: None   Collection Time: 10/05/23 10:50 AM  Result Value Ref Range Status   Specimen Description EXPECTORATED SPUTUM  Final   Special Requests NONE  Final   Sputum evaluation   Final    THIS SPECIMEN IS ACCEPTABLE FOR SPUTUM CULTURE Performed at [facility], [address] [place], [place] [address]    Report Status 10/05/2023 FINAL  Final  Culture, Respiratory w Gram Stain     Status: None   Collection Time: 10/05/23 10:50 AM  Result Value Ref Range Status   Specimen Description   Final    EXPECTORATED SPUTUM Performed at [facility], [address], [place], [place] [address]    Special Requests   Final    NONE Reflexed from [id number] Performed at [facility], [address] [place], [place] [address]    Gram Stain   Final    RARE WBC SEEN RARE VONNE POSITIVE RODS RARE VONNE POSITIVE COCCI RARE GRAM NEGATIVE RODS    Culture   Final    FEW Normal  respiratory flora-no Staph aureus or Pseudomonas seen Performed at [facility], [address], [place], [place] [address]    Report Status 10/07/2023 FINAL  Final  MRSA Next Gen by PCR, Nasal     Status: None   Collection Time: 10/06/23 12:57 AM   Specimen: Nasal Mucosa; Nasal Swab  Result Value Ref Range Status   MRSA by PCR Next Gen NOT DETECTED NOT DETECTED Final    Comment: (NOTE) The GeneXpert MRSA Assay (FDA approved for NASAL specimens only), is one component of a comprehensive MRSA colonization surveillance program.  It is not intended to diagnose MRSA infection nor to guide or monitor treatment for MRSA infections. Test performance is not FDA approved in patients less than 48 years old. Performed at [facility], [address] [place], [place] [address]   Culture, Respiratory w Gram Stain     Status: None   Collection Time: 10/06/23 11:37 AM   Specimen: INDUCED SPUTUM  Result Value Ref Range Status   Specimen Description   Final    INDUCED SPUTUM Performed at [facility], [address], [place], [place] [address]    Special Requests   Final    NONE Performed at [facility], [address] [place], [place] [address]    Gram Stain   Final    FEW WBC PRESENT,BOTH PMN AND MONONUCLEAR FEW GRAM POSITIVE RODS    Culture   Final    MODERATE LACTOBACILLUS FERMENTUM Standardized susceptibility testing for this organism is not available. Performed at [facility], [address] [place], [place] [address]    Report Status 10/09/2023 FINAL  Final  Culture, blood (x 2)     Status: None   Collection Time: 10/22/23  1:00 AM   Specimen: BLOOD  Result Value Ref Range Status   Specimen Description BLOOD BLOOD RIGHT ARM  Final   Special Requests   Final    BOTTLES DRAWN AEROBIC AND ANAEROBIC Blood Culture adequate volume   Culture   Final    NO GROWTH 5 DAYS Performed at [facility], [address], [place],  [place] [address]    Report Status 10/27/2023 FINAL  Final  Culture, blood (x 2)     Status: None   Collection Time: 10/22/23  1:00 AM   Specimen: BLOOD  Result Value Ref Range Status   Specimen Description BLOOD BLOOD RIGHT ARM  Final   Special Requests   Final    BOTTLES DRAWN AEROBIC AND ANAEROBIC Blood Culture adequate volume   Culture   Final    NO GROWTH 5 DAYS Performed at [facility], [address] [place], [place] [address]    Report Status 10/27/2023 FINAL  Final  Resp panel by RT-PCR (RSV, Flu A&B, Covid) Anterior Nasal Swab     Status: None   Collection Time: 11/09/23  5:31 PM   Specimen: Anterior Nasal Swab  Result Value Ref Range Status   SARS Coronavirus 2 by RT PCR NEGATIVE NEGATIVE Final    Comment: (NOTE) SARS-CoV-2 target nucleic acids are NOT DETECTED.  The SARS-CoV-2 RNA is generally detectable in upper respiratory specimens during the acute phase of infection. The lowest concentration of SARS-CoV-2 viral copies this assay can detect is 138 copies/mL. A negative result does not preclude SARS-Cov-2 infection and should not be used as the sole basis for treatment or other patient management decisions. A negative result may occur with  improper specimen collection/handling, submission of specimen other than nasopharyngeal swab, presence of viral mutation(s) within the areas targeted by this assay, and inadequate number of viral copies(<138 copies/mL). A negative result must be combined with clinical observations, patient history, and epidemiological information. The expected result is Negative.  Fact Sheet for Patients:  BloggerCourse.com  Fact Sheet for Healthcare Providers:  SeriousBroker.it  This test is no t yet approved or cleared by the [place]  FDA and  has been authorized for detection and/or diagnosis of SARS-CoV-2 by FDA under an Emergency Use Authorization (EUA). This EUA will remain   in effect (meaning this  test can be used) for the duration of the COVID-19 declaration under Section 564(b)(1) of the Act, 21 U.S.C.section 360bbb-3(b)(1), unless the authorization is terminated  or revoked sooner.       Influenza A by PCR NEGATIVE NEGATIVE Final   Influenza B by PCR NEGATIVE NEGATIVE Final    Comment: (NOTE) The Xpert Xpress SARS-CoV-2/FLU/RSV plus assay is intended as an aid in the diagnosis of influenza from Nasopharyngeal swab specimens and should not be used as a sole basis for treatment. Nasal washings and aspirates are unacceptable for Xpert Xpress SARS-CoV-2/FLU/RSV testing.  Fact Sheet for Patients: BloggerCourse.com  Fact Sheet for Healthcare Providers: SeriousBroker.it  This test is not yet approved or cleared by the [place]  FDA and has been authorized for detection and/or diagnosis of SARS-CoV-2 by FDA under an Emergency Use Authorization (EUA). This EUA will remain in effect (meaning this test can be used) for the duration of the COVID-19 declaration under Section 564(b)(1) of the Act, 21 U.S.C. section 360bbb-3(b)(1), unless the authorization is terminated or revoked.     Resp Syncytial Virus by PCR NEGATIVE NEGATIVE Final    Comment: (NOTE) Fact Sheet for Patients: BloggerCourse.com  Fact Sheet for Healthcare Providers: SeriousBroker.it  This test is not yet approved or cleared by the [place]  FDA and has been authorized for detection and/or diagnosis of SARS-CoV-2 by FDA under an Emergency Use Authorization (EUA). This EUA will remain in effect (meaning this test can be used) for the duration of the COVID-19 declaration under Section 564(b)(1) of the Act, 21 U.S.C. section 360bbb-3(b)(1), unless the authorization is terminated or revoked.  Performed at [facility], [address] [place], [place] [address]    Respiratory (~20 pathogens) panel by PCR     Status: None   Collection Time: 11/09/23  5:31 PM   Specimen: Nasopharyngeal Swab; Respiratory  Result Value Ref Range Status   Adenovirus NOT DETECTED NOT DETECTED Final   Coronavirus 229E NOT DETECTED NOT DETECTED Final    Comment: (NOTE) The Coronavirus on the Respiratory Panel, DOES NOT test for the novel  Coronavirus (2019 nCoV)    Coronavirus HKU1 NOT DETECTED NOT DETECTED Final   Coronavirus NL63 NOT DETECTED NOT DETECTED Final   Coronavirus OC43 NOT DETECTED NOT DETECTED Final   Metapneumovirus NOT DETECTED NOT DETECTED Final   Rhinovirus / Enterovirus NOT DETECTED NOT DETECTED Final   Influenza A NOT DETECTED NOT DETECTED Final   Influenza B NOT DETECTED NOT DETECTED Final   Parainfluenza Virus 1 NOT DETECTED NOT DETECTED Final   Parainfluenza Virus 2 NOT DETECTED NOT DETECTED Final   Parainfluenza Virus 3 NOT DETECTED NOT DETECTED Final   Parainfluenza Virus 4 NOT DETECTED NOT DETECTED Final   Respiratory Syncytial Virus NOT DETECTED NOT DETECTED Final   Bordetella pertussis NOT DETECTED NOT DETECTED Final   Bordetella Parapertussis NOT DETECTED NOT DETECTED Final   Chlamydophila pneumoniae NOT DETECTED NOT DETECTED Final   Mycoplasma pneumoniae NOT DETECTED NOT DETECTED Final    Comment: Performed at [facility], [address], [place], [place] [address]  Culture, blood (Routine X 2) w Reflex to ID Panel     Status: None   Collection Time: 11/09/23  6:16 PM   Specimen: BLOOD  Result Value Ref Range Status   Specimen Description   Final    BLOOD RIGHT ANTECUBITAL Performed at [facility], [address], [place], [place] [address]    Special Requests   Final  BOTTLES DRAWN AEROBIC ONLY Blood Culture adequate volume Performed at [facility], [address] [place], [place] [address]    Culture   Final    NO GROWTH 11 DAYS Performed at [facility], [address], [place],  [place] [address]    Report Status 11/20/2023 FINAL  Final  Culture, blood (Routine X 2) w Reflex to ID Panel     Status: None   Collection Time: 11/09/23  6:23 PM   Specimen: BLOOD  Result Value Ref Range Status   Specimen Description   Final    BLOOD BLOOD LEFT FOREARM Performed at [facility], [address] [place], [place] [address]    Special Requests   Final    BOTTLES DRAWN AEROBIC AND ANAEROBIC Blood Culture adequate volume Performed at [facility], [address], [place], [place] [address]    Culture   Final    NO GROWTH 11 DAYS Performed at [facility], [address], [place], [place] [address]    Report Status 11/20/2023 FINAL  Final  Urine Culture     Status: Abnormal   Collection Time: 11/10/23  1:58 AM   Specimen: Urine, Random  Result Value Ref Range Status   Specimen Description   Final    URINE, RANDOM Performed at [facility], [address] [place], [place] [address]    Special Requests   Final    NONE Reflexed from [id number] Performed at [facility], [address] [place], [place] [address]    Culture 60,000 COLONIES/mL ENTEROCOCCUS FAECALIS (A)  Final   Report Status 11/12/2023 FINAL  Final   Organism ID, Bacteria ENTEROCOCCUS FAECALIS (A)  Final      Susceptibility   Enterococcus faecalis - MIC*    AMPICILLIN  <=2 SENSITIVE Sensitive     NITROFURANTOIN <=16 SENSITIVE Sensitive     VANCOMYCIN  1 SENSITIVE Sensitive     * 60,000 COLONIES/mL ENTEROCOCCUS FAECALIS  Culture, blood (x 2)     Status: None   Collection Time: 11/25/23 10:31 PM   Specimen: BLOOD  Result Value Ref Range Status   Specimen Description BLOOD BLOOD LEFT ARM  Final   Special Requests   Final    BOTTLES DRAWN AEROBIC AND ANAEROBIC Blood Culture adequate volume   Culture   Final    NO GROWTH 5 DAYS Performed at [facility], [address] [place], [place] [address]    Report Status 11/30/2023 FINAL  Final  Culture, blood  (x 2)     Status: None   Collection Time: 11/25/23 10:31 PM   Specimen: BLOOD  Result Value Ref Range Status   Specimen Description BLOOD BLOOD RIGHT ARM  Final   Special Requests   Final    BOTTLES DRAWN AEROBIC AND ANAEROBIC Blood Culture adequate volume   Culture   Final    NO GROWTH 5 DAYS Performed at [facility], [address] [place], [place] [address]    Report Status 11/30/2023 FINAL  Final  Gastrointestinal Panel by PCR , Stool     Status: None   Collection Time: 11/30/23  5:10 PM   Specimen: Stool  Result Value Ref Range Status   Campylobacter species NOT DETECTED NOT DETECTED Final   Plesimonas shigelloides NOT DETECTED NOT DETECTED Final   Salmonella species NOT DETECTED NOT DETECTED Final   Yersinia enterocolitica NOT DETECTED NOT DETECTED Final   Vibrio species NOT DETECTED NOT DETECTED Final   Vibrio  cholerae NOT DETECTED NOT DETECTED Final   Enteroaggregative E coli (EAEC) NOT DETECTED NOT DETECTED Final   Enteropathogenic E coli (EPEC) NOT DETECTED NOT DETECTED Final   Enterotoxigenic E coli (ETEC) NOT DETECTED NOT DETECTED Final   Shiga like toxin producing E coli (STEC) NOT DETECTED NOT DETECTED Final   Shigella/Enteroinvasive E coli (EIEC) NOT DETECTED NOT DETECTED Final   Cryptosporidium NOT DETECTED NOT DETECTED Final   Cyclospora cayetanensis NOT DETECTED NOT DETECTED Final   Entamoeba histolytica NOT DETECTED NOT DETECTED Final   Giardia lamblia NOT DETECTED NOT DETECTED Final   Adenovirus F40/41 NOT DETECTED NOT DETECTED Final   Astrovirus NOT DETECTED NOT DETECTED Final   Norovirus GI/GII NOT DETECTED NOT DETECTED Final   Rotavirus A NOT DETECTED NOT DETECTED Final   Sapovirus (I, II, IV, and V) NOT DETECTED NOT DETECTED Final    Comment: Performed at [facility], [address] [place], [place] [address]    Coagulation Studies: No results for input(s): LABPROT, INR in the last 72 hours.   Urinalysis: No results for  input(s): COLORURINE, LABSPEC, PHURINE, GLUCOSEU, HGBUR, BILIRUBINUR, KETONESUR, PROTEINUR, UROBILINOGEN, NITRITE, LEUKOCYTESUR in the last 72 hours.  Invalid input(s): APPERANCEUR     Imaging: No results found.        Medications:    albumin  human 25 g (12/09/23 1054)   piperacillin -tazobactam (ZOSYN )  IV 2.25 g (12/10/23 0655)    sodium chloride    Intravenous Once   acidophilus  1 capsule Oral Daily   Chlorhexidine  Gluconate Cloth  6 each Topical Q0600   epoetin  alfa-epbx (RETACRIT ) injection  10,000 Units Intravenous Q M,W,F-HD   feeding supplement (NEPRO CARB STEADY)  237 mL Oral 5 X Daily   feeding supplement (PROSource TF20)  60 mL Per Tube Daily   ferrous sulfate   325 mg Per Tube Daily   free water   120 mL Per Tube 5 X Daily   gentamicin  ointment   Topical TID   heparin  injection (subcutaneous)  5,000 Units Subcutaneous Q8H   insulin  aspart  10 Units Subcutaneous Once   midodrine   5 mg Per Tube TID WC   multivitamin  1 tablet Per Tube QHS   pantoprazole  (PROTONIX ) IV  40 mg Intravenous Q12H   acetaminophen  **OR** acetaminophen , albumin  human, artificial tears, bisacodyl , ipratropium-albuterol , [DISCONTINUED] ondansetron  **OR** ondansetron  (ZOFRAN ) IV, mouth rinse, sennosides  Assessment/ Plan:  Robert Vega is a 83 y.o.  male with obstructive sleep apnea, GERD, obesity, hypertension, dysphagia, severe esophageal dysmotility with achalasia status post PEG tube placement, recent aspiration pneumonia acute respiratory failure status post extubation 10/09/2023, who was admitted to [facility] on 10/05/2023 for Aspiration into respiratory tract, initial encounter [T17.908A] Severe sepsis (HCC) [A41.9, R65.20] History of dysphagia [Z87.898] Fever, unspecified fever cause [R50.9] Multifocal pneumonia [J18.9]   1.  Acute kidney injury. Requiring hemodialysis. With obstructive uropathy Baseline creatinine 0.81 from 10/14/2023.  Acute kidney injury  secondary to ATN, obstructive uropathy, and progression of prerenal azotemia.   Patient is critically ill.  He was started on hemodialysis for severe hyperkalemia and uremia. Vascular surgery placed PermCath on 6/26.  - TOC and dialysis coordinator continue search for rehab.  - Next treatment scheduled for Thursday.  - Accepted at [facility] on a TTS schedule, chair time 7:10am.    2.  Sepsis and hypotension due to aspiration pneumonia, found to have distal esophageal obstruction with severe dysmotility.  He is status post botulinum toxin injection to the lower esophageal sphincter.  PEG  tube placed this admission. Receiving continuous tube feeds with scheduled free water  flushes.  - Continue midodrine  - Continues to receive Zosyn   4.Anemia in the setting of renal failure Lab Results  Component Value Date   HGB 7.0 (L) 12/10/2023  - Continue IV EPO  with HD treatments.  -Hemoglobin 7.0  5.  Bilateral hydronephrosis Noted on CT.Urology team has evaluated the patient and it is thought to be secondary to reflux.  Due to health, further intervention deferred at this time.     LOS: 64 Josepha Barbier 7/15/202512:10 PM

## 2023-12-10 NOTE — Progress Notes (Signed)
 Mobility Specialist - Progress Note   12/10/23 1135  Mobility  Activity Transferred from bed to chair;Turned to right side;Turned to left side;Turned to back - supine  Level of Assistance +2 (takes two people)  Assistive Device Sling  Mobility visit 1 Mobility     Pt lying in bed upon arrival, utilizing 2.5L. Family at bedside. Pt is awake but very minimally verbal. Rolled L/R with maxA +2 for hygiene care and sling placement. Transferred bed-chair via hoyer lift. Pt left in chair with needs in reach. PT/OT at bedside upon exit.     Lennette Seip Mobility Specialist 12/10/23, 11:38 AM

## 2023-12-10 NOTE — Progress Notes (Signed)
 Date of Admission:  10/05/2023      ID: Robert Vega is a 83 y.o. male Principal Problem:   Severe sepsis (HCC) Active Problems:   Encounter for dialysis and dialysis catheter care (HCC)   GERD (gastroesophageal reflux disease)   Dysphagia   Essential hypertension, benign   Multifocal pneumonia   Acute respiratory failure with hypoxia and hypercapnia (HCC)   History of dysphagia   Aspiration pneumonia (HCC)   Achalasia of esophagus   Acute delirium   Obesity (BMI 30-39.9)   Generalized weakness   Gastrostomy complication (HCC)   AKI (acute kidney injury) (HCC)   Generalized abdominal pain   Malnutrition of moderate degree   Melena   Leukocytosis   Perinephric hematoma   Hydronephrosis   5/10 admission for weakness, hypoxia- aspiration pneumonia due to achlasia  5/10 CT b/l infitrates- intubated- extubated 5/14 Dilated esophagus 5/13 PEG instertion by GI 5/20 repeat EGD botulinum injection 5/25 pulled PEG out 5/27 exp lap for dislodged peg in the abdominal cavity 6/74melena 6/10 EGD 6/16 HD cath- dialysis started 6/26 permanent HD cath  Antibiotic therapy Ceftriaxone  5/10-5/14 Azithro 5/10-5/11 Metronidazole  5/10 Zosyn  5/26-5/31 Cefepime  6/30-7/3 Zosyn  7/3 >>7/15    Subjective: NA   Medications:   sodium chloride    Intravenous Once   sodium chloride    Intravenous Once   acidophilus  1 capsule Oral Daily   Chlorhexidine  Gluconate Cloth  6 each Topical Q0600   epoetin  alfa-epbx (RETACRIT ) injection  10,000 Units Intravenous Q M,W,F-HD   feeding supplement (NEPRO CARB STEADY)  237 mL Oral 5 X Daily   feeding supplement (PROSource TF20)  60 mL Per Tube Daily   ferrous sulfate   325 mg Per Tube Daily   free water   120 mL Per Tube 5 X Daily   gentamicin  ointment   Topical TID   heparin  injection (subcutaneous)  5,000 Units Subcutaneous Q8H   insulin  aspart  10 Units Subcutaneous Once   midodrine   5 mg Per Tube TID WC   multivitamin  1 tablet Per Tube  QHS   pantoprazole  (PROTONIX ) IV  40 mg Intravenous Q12H    Objective: Vital signs in last 24 hours: Patient Vitals for the past 24 hrs:  BP Temp Temp src Pulse Resp SpO2  12/10/23 0905 (!) 124/56 99.3 F (37.4 C) -- 78 18 100 %  12/10/23 0213 (!) 141/68 98.1 F (36.7 C) Oral 75 20 98 %  12/09/23 2048 126/64 (!) 97.2 F (36.2 C) Axillary 79 20 96 %  12/09/23 1654 (!) 121/58 97.6 F (36.4 C) -- 80 (!) 30 98 %  12/09/23 1434 137/72 98.6 F (37 C) -- 86 18 95 %     LDA HD cath PHYSICAL EXAM:  General: lethargic, opens eyes to calling his name, trying to say something Lungs:b/l air entry-  Heart: Regular rate and rhythm, no murmur, rub or gallop. Abdomen: Soft, peg Rectal tube CNS unable to assess Lab Results    Latest Ref Rng & Units 12/10/2023    3:34 AM 12/09/2023    5:53 AM 12/08/2023    5:29 AM  CBC  WBC 4.0 - 10.5 K/uL 15.0  14.8  14.6   Hemoglobin 13.0 - 17.0 g/dL 7.0  7.4  7.2   Hematocrit 39.0 - 52.0 % 22.9  23.4  24.0   Platelets 150 - 400 K/uL 374  410  408        Latest Ref Rng & Units 12/10/2023    3:34 AM 12/09/2023  5:53 AM 12/08/2023    5:29 AM  CMP  Glucose 70 - 99 mg/dL 896  95  895   BUN 8 - 23 mg/dL 49  86  62   Creatinine 0.61 - 1.24 mg/dL 5.53  2.83  4.54   Sodium 135 - 145 mmol/L 134  134  132   Potassium 3.5 - 5.1 mmol/L 3.8  4.1  3.8   Chloride 98 - 111 mmol/L 95  96  93   CO2 22 - 32 mmol/L 28  23  25    Calcium  8.9 - 10.3 mg/dL 8.1  7.9  7.9     Wbc 74.5>85.1  Microbiology: 5/10 BC NG 5/27 BC NG 6/14 BC- NG 6/30 BC NG Studies/Results:  B/l infitrates   Assessment/Plan:  Prolonged and Complicated hospital stay.  Encephalopathy  Aspiration pneumonia Hypoxia - intubated and then extubated Achalasia, dilated esophagus para esophageal hernia  PEG  Dislodgement and peritonitis- exp lap on 5/27 Peg in place  Leucocytosis- multifactorial- reactive to hydronephrosis, perinephric stranding - possible infection,  perinephric  hematoma, aspiration pneumonia,  Been on antibiotics sine 6/30 - zosyn  started on 7/3 -day 13 improvement in leucocytosis- 25.4>13.1 and now plateued around 15 Will DC zosyn  and assess how he does Liquid stool - on peg feeds and was constipated and received miralax  last week -  Currently no suspicion for cdiff. will follow closely   B/l hydroureteronephrosis- unclear etiology- bladder wall thickening. Did he have BOO? No calculus CT scan repeated showed proteinaceous hemorrhagic material in kidneys- Will he need perc nephrostomy?  Left perinephric hematoma ? etiology Urology following- if wbc worsens would need repeat imaging and stents VS PCN  AKI since 5/26 now on dialysis  Discussed the management with his granddaughter and hospitalist

## 2023-12-11 DIAGNOSIS — R6520 Severe sepsis without septic shock: Secondary | ICD-10-CM | POA: Diagnosis not present

## 2023-12-11 DIAGNOSIS — A419 Sepsis, unspecified organism: Secondary | ICD-10-CM | POA: Diagnosis not present

## 2023-12-11 DIAGNOSIS — G9340 Encephalopathy, unspecified: Secondary | ICD-10-CM

## 2023-12-11 DIAGNOSIS — K220 Achalasia of cardia: Secondary | ICD-10-CM | POA: Diagnosis not present

## 2023-12-11 DIAGNOSIS — R0902 Hypoxemia: Secondary | ICD-10-CM | POA: Diagnosis not present

## 2023-12-11 DIAGNOSIS — J690 Pneumonitis due to inhalation of food and vomit: Secondary | ICD-10-CM | POA: Diagnosis not present

## 2023-12-11 LAB — CBC
HCT: 26.4 % — ABNORMAL LOW (ref 39.0–52.0)
Hemoglobin: 8.2 g/dL — ABNORMAL LOW (ref 13.0–17.0)
MCH: 28.2 pg (ref 26.0–34.0)
MCHC: 31.1 g/dL (ref 30.0–36.0)
MCV: 90.7 fL (ref 80.0–100.0)
Platelets: 380 K/uL (ref 150–400)
RBC: 2.91 MIL/uL — ABNORMAL LOW (ref 4.22–5.81)
RDW: 15.9 % — ABNORMAL HIGH (ref 11.5–15.5)
WBC: 13.1 K/uL — ABNORMAL HIGH (ref 4.0–10.5)
nRBC: 0 % (ref 0.0–0.2)

## 2023-12-11 LAB — GLUCOSE, CAPILLARY
Glucose-Capillary: 121 mg/dL — ABNORMAL HIGH (ref 70–99)
Glucose-Capillary: 101 mg/dL — ABNORMAL HIGH (ref 70–99)
Glucose-Capillary: 113 mg/dL — ABNORMAL HIGH (ref 70–99)
Glucose-Capillary: 105 mg/dL — ABNORMAL HIGH (ref 70–99)

## 2023-12-11 LAB — TYPE AND SCREEN
ABO/RH(D): O POS
Antibody Screen: NEGATIVE
Unit division: 0

## 2023-12-11 LAB — BASIC METABOLIC PANEL WITH GFR
Anion gap: 14 (ref 5–15)
BUN: 78 mg/dL — ABNORMAL HIGH (ref 8–23)
CO2: 24 mmol/L (ref 22–32)
Calcium: 8.1 mg/dL — ABNORMAL LOW (ref 8.9–10.3)
Chloride: 95 mmol/L — ABNORMAL LOW (ref 98–111)
Creatinine, Ser: 5.88 mg/dL — ABNORMAL HIGH (ref 0.61–1.24)
GFR, Estimated: 9 mL/min — ABNORMAL LOW (ref >60)
Glucose, Bld: 109 mg/dL — ABNORMAL HIGH (ref 70–99)
Potassium: 4 mmol/L (ref 3.5–5.1)
Sodium: 133 mmol/L — ABNORMAL LOW (ref 135–145)

## 2023-12-11 LAB — BPAM RBC
Blood Product Expiration Date: 202508192359
ISSUE DATE / TIME: 202507151638
Unit Type and Rh: 5100

## 2023-12-11 LAB — VITAMIN B12: Vitamin B-12: 330 pg/mL (ref 180–914)

## 2023-12-11 LAB — CORTISOL-AM, BLOOD: Cortisol - AM: 11.1 ug/dL (ref 6.7–22.6)

## 2023-12-11 MED ORDER — NEPRO/CARBSTEADY PO LIQD
237.0000 mL | Freq: Every day | ORAL | Status: DC
  Administered 2023-12-11 – 2023-12-16 (×22): 237 mL

## 2023-12-11 MED ORDER — SACCHAROMYCES BOULARDII 250 MG PO CAPS
250.0000 mg | ORAL_CAPSULE | Freq: Two times a day (BID) | ORAL | Status: DC
  Administered 2023-12-11 – 2023-12-16 (×8): 250 mg
  Filled 2023-12-11 (×10): qty 1

## 2023-12-11 MED ORDER — EPOETIN ALFA-EPBX 10000 UNIT/ML IJ SOLN
10000.0000 [IU] | INTRAMUSCULAR | Status: DC
  Administered 2023-12-12 – 2023-12-14 (×2): 10000 [IU] via INTRAVENOUS
  Filled 2023-12-11: qty 2

## 2023-12-11 MED ORDER — ACETAMINOPHEN 650 MG RE SUPP: 650.0000 mg | Freq: Four times a day (QID) | RECTAL | Status: DC | PRN

## 2023-12-11 MED ORDER — ACETAMINOPHEN 325 MG PO TABS: 650.0000 mg | ORAL_TABLET | Freq: Four times a day (QID) | ORAL | Status: DC | PRN

## 2023-12-11 MED ORDER — NEPRO/CARBSTEADY PO LIQD
237.0000 mL | Freq: Every day | ORAL | Status: DC
  Administered 2023-12-11: 237 mL

## 2023-12-11 NOTE — Progress Notes (Signed)
 [facility]  ROUNDING NOTE   Subjective:   Patient laying in bed Eyes closed Unresponsive to name  Objective:  Vital signs in last 24 hours:  Temp:  [97.4 F (36.3 C)-98.3 F (36.8 C)] 97.4 F (36.3 C) (07/16 0839) Pulse Rate:  [78-80] 79 (07/16 0839) Resp:  [18-28] 20 (07/16 0839) BP: (117-132)/(58-69) 126/69 (07/16 0839) SpO2:  [92 %-98 %] 98 % (07/16 0839)  Weight change:  Filed Weights   11/29/23 0830 11/29/23 1230 12/02/23 1726  Weight: 96.8 kg 95.3 kg 94.3 kg    Intake/Output: I/O last 3 completed shifts: In: 6112 [Blood:368; Other:474; NG/GT:5220; IV Piggyback:50] Out: 1075 [Stool:1075]   Intake/Output this shift:  Total I/O In: 120 [NG/GT:120] Out: -   Physical Exam: General: Chronically ill appearing  Head: Oral mucosa moist  Eyes: Anicteric  Lungs:  Diminished  Heart: Regular rate and rhythm  Abdomen:  Peg tube in place, +distended  Extremities: no peripheral edema.  Neurologic: Arousable  Skin: No lesions  Access: Rt internal jugular permcath    Basic Metabolic Panel: Recent Labs  Lab 12/07/23 0640 12/08/23 0529 12/09/23 0553 12/10/23 0334 12/11/23 0421  NA 133* 132* 134* 134* 133*  K 3.4* 3.8 4.1 3.8 4.0  CL 95* 93* 96* 95* 95*  CO2 28 25 23 28 24   GLUCOSE 122* 104* 95 103* 109*  BUN 41* 62* 86* 49* 78*  CREATININE 4.19* 5.45* 7.16* 4.46* 5.88*  CALCIUM  7.9* 7.9* 7.9* 8.1* 8.1*    Liver Function Tests: Recent Labs  Lab 12/10/23 0334  AST 24  ALT 17  ALKPHOS 76  BILITOT 0.4  PROT 7.7  ALBUMIN  2.1*    No results for input(s): LIPASE, AMYLASE in the last 168 hours. Recent Labs  Lab 12/10/23 2234  AMMONIA 54*    CBC: Recent Labs  Lab 12/07/23 0640 12/08/23 0529 12/09/23 0553 12/10/23 0334 12/10/23 2113 12/11/23 0421  WBC 13.1* 14.6* 14.8* 15.0*  --  13.1*  HGB 7.4* 7.2* 7.4* 7.0* 7.9* 8.2*  HCT 24.9* 24.0* 23.4* 22.9* 25.5* 26.4*  MCV 92.9 92.3 90.0 91.6  --  90.7  PLT 454* 408* 410* 374  --   380    Cardiac Enzymes: No results for input(s): CKTOTAL, CKMB, CKMBINDEX, TROPONINI in the last 168 hours.  BNP: Invalid input(s): POCBNP  CBG: Recent Labs  Lab 12/09/23 1658 12/10/23 0907 12/10/23 1132 12/10/23 1639 12/11/23 0840  GLUCAP 105* 131* 131* 129* 121*    Microbiology: Results for orders placed or performed during the hospital encounter of 10/05/23  Blood Culture (routine x 2)     Status: None   Collection Time: 10/05/23  5:06 AM   Specimen: BLOOD  Result Value Ref Range Status   Specimen Description BLOOD LA  Final   Special Requests   Final    BOTTLES DRAWN AEROBIC AND ANAEROBIC Blood Culture results may not be optimal due to an inadequate volume of blood received in culture bottles   Culture   Final    NO GROWTH 5 DAYS Performed at [facility], [address] [place], [place] [address]    Report Status 10/10/2023 FINAL  Final  Blood Culture (routine x 2)     Status: None   Collection Time: 10/05/23  5:07 AM   Specimen: BLOOD  Result Value Ref Range Status   Specimen Description BLOOD RA  Final   Special Requests   Final    BOTTLES DRAWN AEROBIC AND ANAEROBIC Blood Culture results may not be  optimal due to an inadequate volume of blood received in culture bottles   Culture   Final    NO GROWTH 5 DAYS Performed at [facility], [address] [place], [place] [address]    Report Status 10/10/2023 FINAL  Final  Resp panel by RT-PCR (RSV, Flu A&B, Covid) Anterior Nasal Swab     Status: None   Collection Time: 10/05/23  5:56 AM   Specimen: Anterior Nasal Swab  Result Value Ref Range Status   SARS Coronavirus 2 by RT PCR NEGATIVE NEGATIVE Final    Comment: (NOTE) SARS-CoV-2 target nucleic acids are NOT DETECTED.  The SARS-CoV-2 RNA is generally detectable in upper respiratory specimens during the acute phase of infection. The lowest concentration of SARS-CoV-2 viral copies this assay can detect is 138 copies/mL. A  negative result does not preclude SARS-Cov-2 infection and should not be used as the sole basis for treatment or other patient management decisions. A negative result may occur with  improper specimen collection/handling, submission of specimen other than nasopharyngeal swab, presence of viral mutation(s) within the areas targeted by this assay, and inadequate number of viral copies(<138 copies/mL). A negative result must be combined with clinical observations, patient history, and epidemiological information. The expected result is Negative.  Fact Sheet for Patients:  BloggerCourse.com  Fact Sheet for Healthcare Providers:  SeriousBroker.it  This test is no t yet approved or cleared by the [place]  FDA and  has been authorized for detection and/or diagnosis of SARS-CoV-2 by FDA under an Emergency Use Authorization (EUA). This EUA will remain  in effect (meaning this test can be used) for the duration of the COVID-19 declaration under Section 564(b)(1) of the Act, 21 U.S.C.section 360bbb-3(b)(1), unless the authorization is terminated  or revoked sooner.       Influenza A by PCR NEGATIVE NEGATIVE Final   Influenza B by PCR NEGATIVE NEGATIVE Final    Comment: (NOTE) The Xpert Xpress SARS-CoV-2/FLU/RSV plus assay is intended as an aid in the diagnosis of influenza from Nasopharyngeal swab specimens and should not be used as a sole basis for treatment. Nasal washings and aspirates are unacceptable for Xpert Xpress SARS-CoV-2/FLU/RSV testing.  Fact Sheet for Patients: BloggerCourse.com  Fact Sheet for Healthcare Providers: SeriousBroker.it  This test is not yet approved or cleared by the [place]  FDA and has been authorized for detection and/or diagnosis of SARS-CoV-2 by FDA under an Emergency Use Authorization (EUA). This EUA will remain in effect (meaning this test can  be used) for the duration of the COVID-19 declaration under Section 564(b)(1) of the Act, 21 U.S.C. section 360bbb-3(b)(1), unless the authorization is terminated or revoked.     Resp Syncytial Virus by PCR NEGATIVE NEGATIVE Final    Comment: (NOTE) Fact Sheet for Patients: BloggerCourse.com  Fact Sheet for Healthcare Providers: SeriousBroker.it  This test is not yet approved or cleared by the [place]  FDA and has been authorized for detection and/or diagnosis of SARS-CoV-2 by FDA under an Emergency Use Authorization (EUA). This EUA will remain in effect (meaning this test can be used) for the duration of the COVID-19 declaration under Section 564(b)(1) of the Act, 21 U.S.C. section 360bbb-3(b)(1), unless the authorization is terminated or revoked.  Performed at [facility], [address] [place], [place] [address]   Respiratory (~20 pathogens) panel by PCR     Status: None   Collection Time: 10/05/23  8:11 AM   Specimen: Nasopharyngeal Swab; Respiratory  Result Value Ref Range Status  Adenovirus NOT DETECTED NOT DETECTED Final   Coronavirus 229E NOT DETECTED NOT DETECTED Final    Comment: (NOTE) The Coronavirus on the Respiratory Panel, DOES NOT test for the novel  Coronavirus (2019 nCoV)    Coronavirus HKU1 NOT DETECTED NOT DETECTED Final   Coronavirus NL63 NOT DETECTED NOT DETECTED Final   Coronavirus OC43 NOT DETECTED NOT DETECTED Final   Metapneumovirus NOT DETECTED NOT DETECTED Final   Rhinovirus / Enterovirus NOT DETECTED NOT DETECTED Final   Influenza A NOT DETECTED NOT DETECTED Final   Influenza B NOT DETECTED NOT DETECTED Final   Parainfluenza Virus 1 NOT DETECTED NOT DETECTED Final   Parainfluenza Virus 2 NOT DETECTED NOT DETECTED Final   Parainfluenza Virus 3 NOT DETECTED NOT DETECTED Final   Parainfluenza Virus 4 NOT DETECTED NOT DETECTED Final   Respiratory Syncytial Virus NOT DETECTED NOT  DETECTED Final   Bordetella pertussis NOT DETECTED NOT DETECTED Final   Bordetella Parapertussis NOT DETECTED NOT DETECTED Final   Chlamydophila pneumoniae NOT DETECTED NOT DETECTED Final   Mycoplasma pneumoniae NOT DETECTED NOT DETECTED Final    Comment: Performed at [facility], [address] [place], [place] [address]  Expectorated Sputum Assessment w Gram Stain, Rflx to Resp Cult     Status: None   Collection Time: 10/05/23  9:07 AM   Specimen: Sputum  Result Value Ref Range Status   Specimen Description SPUTUM  Final   Special Requests NONE  Final   Sputum evaluation   Final    Sputum specimen not acceptable for testing.  Please recollect.   C/KERRY NELSON AT 1005 10/05/23.PMF Performed at [facility], [address] [place], [place] [address]    Report Status 10/05/2023 FINAL  Final  Expectorated Sputum Assessment w Gram Stain, Rflx to Resp Cult     Status: None   Collection Time: 10/05/23 10:50 AM  Result Value Ref Range Status   Specimen Description EXPECTORATED SPUTUM  Final   Special Requests NONE  Final   Sputum evaluation   Final    THIS SPECIMEN IS ACCEPTABLE FOR SPUTUM CULTURE Performed at [facility], [address], [place], [place] [address]    Report Status 10/05/2023 FINAL  Final  Culture, Respiratory w Gram Stain     Status: None   Collection Time: 10/05/23 10:50 AM  Result Value Ref Range Status   Specimen Description   Final    EXPECTORATED SPUTUM Performed at [facility], [address], [place], [place] [address]    Special Requests   Final    NONE Reflexed from [phone] Performed at [facility], [address] [place], [place] [address]    Gram Stain   Final    RARE WBC SEEN RARE VONNE POSITIVE RODS RARE VONNE POSITIVE COCCI RARE GRAM NEGATIVE RODS    Culture   Final    FEW Normal respiratory flora-no Staph aureus or Pseudomonas seen Performed at [facility], [address]  [place], [place] [address]    Report Status 10/07/2023 FINAL  Final  MRSA Next Gen by PCR, Nasal     Status: None   Collection Time: 10/06/23 12:57 AM   Specimen: Nasal Mucosa; Nasal Swab  Result Value Ref Range Status   MRSA by PCR Next Gen NOT DETECTED NOT DETECTED Final    Comment: (NOTE) The GeneXpert MRSA Assay (FDA approved for NASAL specimens only), is one component of a comprehensive MRSA colonization surveillance program. It is not  intended to diagnose MRSA infection nor to guide or monitor treatment for MRSA infections. Test performance is not FDA approved in patients less than 9 years old. Performed at [facility], [address] [place], [place] [address]   Culture, Respiratory w Gram Stain     Status: None   Collection Time: 10/06/23 11:37 AM   Specimen: INDUCED SPUTUM  Result Value Ref Range Status   Specimen Description   Final    INDUCED SPUTUM Performed at [facility], [address], [place], [place] [address]    Special Requests   Final    NONE Performed at [facility], [address] [place], [place] [address]    Gram Stain   Final    FEW WBC PRESENT,BOTH PMN AND MONONUCLEAR FEW GRAM POSITIVE RODS    Culture   Final    MODERATE LACTOBACILLUS FERMENTUM Standardized susceptibility testing for this organism is not available. Performed at [facility], [address], [place], [place] [address]    Report Status 10/09/2023 FINAL  Final  Culture, blood (x 2)     Status: None   Collection Time: 10/22/23  1:00 AM   Specimen: BLOOD  Result Value Ref Range Status   Specimen Description BLOOD BLOOD RIGHT ARM  Final   Special Requests   Final    BOTTLES DRAWN AEROBIC AND ANAEROBIC Blood Culture adequate volume   Culture   Final    NO GROWTH 5 DAYS Performed at [facility], [address], [place], [place] [address]    Report Status 10/27/2023 FINAL  Final  Culture, blood (x 2)     Status: None   Collection  Time: 10/22/23  1:00 AM   Specimen: BLOOD  Result Value Ref Range Status   Specimen Description BLOOD BLOOD RIGHT ARM  Final   Special Requests   Final    BOTTLES DRAWN AEROBIC AND ANAEROBIC Blood Culture adequate volume   Culture   Final    NO GROWTH 5 DAYS Performed at [facility], [address] [place], [place] [address]    Report Status 10/27/2023 FINAL  Final  Resp panel by RT-PCR (RSV, Flu A&B, Covid) Anterior Nasal Swab     Status: None   Collection Time: 11/09/23  5:31 PM   Specimen: Anterior Nasal Swab  Result Value Ref Range Status   SARS Coronavirus 2 by RT PCR NEGATIVE NEGATIVE Final    Comment: (NOTE) SARS-CoV-2 target nucleic acids are NOT DETECTED.  The SARS-CoV-2 RNA is generally detectable in upper respiratory specimens during the acute phase of infection. The lowest concentration of SARS-CoV-2 viral copies this assay can detect is 138 copies/mL. A negative result does not preclude SARS-Cov-2 infection and should not be used as the sole basis for treatment or other patient management decisions. A negative result may occur with  improper specimen collection/handling, submission of specimen other than nasopharyngeal swab, presence of viral mutation(s) within the areas targeted by this assay, and inadequate number of viral copies(<138 copies/mL). A negative result must be combined with clinical observations, patient history, and epidemiological information. The expected result is Negative.  Fact Sheet for Patients:  BloggerCourse.com  Fact Sheet for Healthcare Providers:  SeriousBroker.it  This test is no t yet approved or cleared by the [place]  FDA and  has been authorized for detection and/or diagnosis of SARS-CoV-2 by FDA under an Emergency Use Authorization (EUA). This EUA will remain  in effect (meaning this test can be used)  for the duration of the COVID-19 declaration under Section  564(b)(1) of the Act, 21 U.S.C.section 360bbb-3(b)(1), unless the authorization is terminated  or revoked sooner.       Influenza A by PCR NEGATIVE NEGATIVE Final   Influenza B by PCR NEGATIVE NEGATIVE Final    Comment: (NOTE) The Xpert Xpress SARS-CoV-2/FLU/RSV plus assay is intended as an aid in the diagnosis of influenza from Nasopharyngeal swab specimens and should not be used as a sole basis for treatment. Nasal washings and aspirates are unacceptable for Xpert Xpress SARS-CoV-2/FLU/RSV testing.  Fact Sheet for Patients: BloggerCourse.com  Fact Sheet for Healthcare Providers: SeriousBroker.it  This test is not yet approved or cleared by the [place]  FDA and has been authorized for detection and/or diagnosis of SARS-CoV-2 by FDA under an Emergency Use Authorization (EUA). This EUA will remain in effect (meaning this test can be used) for the duration of the COVID-19 declaration under Section 564(b)(1) of the Act, 21 U.S.C. section 360bbb-3(b)(1), unless the authorization is terminated or revoked.     Resp Syncytial Virus by PCR NEGATIVE NEGATIVE Final    Comment: (NOTE) Fact Sheet for Patients: BloggerCourse.com  Fact Sheet for Healthcare Providers: SeriousBroker.it  This test is not yet approved or cleared by the [place]  FDA and has been authorized for detection and/or diagnosis of SARS-CoV-2 by FDA under an Emergency Use Authorization (EUA). This EUA will remain in effect (meaning this test can be used) for the duration of the COVID-19 declaration under Section 564(b)(1) of the Act, 21 U.S.C. section 360bbb-3(b)(1), unless the authorization is terminated or revoked.  Performed at [facility], [address] [place], [place] [address]   Respiratory (~20 pathogens) panel by PCR     Status: None   Collection Time: 11/09/23  5:31 PM   Specimen:  Nasopharyngeal Swab; Respiratory  Result Value Ref Range Status   Adenovirus NOT DETECTED NOT DETECTED Final   Coronavirus 229E NOT DETECTED NOT DETECTED Final    Comment: (NOTE) The Coronavirus on the Respiratory Panel, DOES NOT test for the novel  Coronavirus (2019 nCoV)    Coronavirus HKU1 NOT DETECTED NOT DETECTED Final   Coronavirus NL63 NOT DETECTED NOT DETECTED Final   Coronavirus OC43 NOT DETECTED NOT DETECTED Final   Metapneumovirus NOT DETECTED NOT DETECTED Final   Rhinovirus / Enterovirus NOT DETECTED NOT DETECTED Final   Influenza A NOT DETECTED NOT DETECTED Final   Influenza B NOT DETECTED NOT DETECTED Final   Parainfluenza Virus 1 NOT DETECTED NOT DETECTED Final   Parainfluenza Virus 2 NOT DETECTED NOT DETECTED Final   Parainfluenza Virus 3 NOT DETECTED NOT DETECTED Final   Parainfluenza Virus 4 NOT DETECTED NOT DETECTED Final   Respiratory Syncytial Virus NOT DETECTED NOT DETECTED Final   Bordetella pertussis NOT DETECTED NOT DETECTED Final   Bordetella Parapertussis NOT DETECTED NOT DETECTED Final   Chlamydophila pneumoniae NOT DETECTED NOT DETECTED Final   Mycoplasma pneumoniae NOT DETECTED NOT DETECTED Final    Comment: Performed at [facility], [address], [place], [place] [address]  Culture, blood (Routine X 2) w Reflex to ID Panel     Status: None   Collection Time: 11/09/23  6:16 PM   Specimen: BLOOD  Result Value Ref Range Status   Specimen Description   Final    BLOOD RIGHT ANTECUBITAL Performed at [facility], [address], [place], [place] [address]    Special Requests   Final    BOTTLES DRAWN AEROBIC  ONLY Blood Culture adequate volume Performed at [facility], [address] [place], [place] [address]    Culture   Final    NO GROWTH 11 DAYS Performed at [facility], [address] [place], [place] [address]    Report Status 11/20/2023 FINAL  Final  Culture, blood (Routine X 2) w Reflex to ID Panel      Status: None   Collection Time: 11/09/23  6:23 PM   Specimen: BLOOD  Result Value Ref Range Status   Specimen Description   Final    BLOOD BLOOD LEFT FOREARM Performed at [facility], [address], [place], [place] [address]    Special Requests   Final    BOTTLES DRAWN AEROBIC AND ANAEROBIC Blood Culture adequate volume Performed at [facility], [address] [place], [place] [address]    Culture   Final    NO GROWTH 11 DAYS Performed at [facility], [address] [place], [place] [address]    Report Status 11/20/2023 FINAL  Final  Urine Culture     Status: Abnormal   Collection Time: 11/10/23  1:58 AM   Specimen: Urine, Random  Result Value Ref Range Status   Specimen Description   Final    URINE, RANDOM Performed at [facility], [address] [place], [place] [address]    Special Requests   Final    NONE Reflexed from [id number] Performed at [facility], [address] [place], [place] [address]    Culture 60,000 COLONIES/mL ENTEROCOCCUS FAECALIS (A)  Final   Report Status 11/12/2023 FINAL  Final   Organism ID, Bacteria ENTEROCOCCUS FAECALIS (A)  Final      Susceptibility   Enterococcus faecalis - MIC*    AMPICILLIN  <=2 SENSITIVE Sensitive     NITROFURANTOIN <=16 SENSITIVE Sensitive     VANCOMYCIN  1 SENSITIVE Sensitive     * 60,000 COLONIES/mL ENTEROCOCCUS FAECALIS  Culture, blood (x 2)     Status: None   Collection Time: 11/25/23 10:31 PM   Specimen: BLOOD  Result Value Ref Range Status   Specimen Description BLOOD BLOOD LEFT ARM  Final   Special Requests   Final    BOTTLES DRAWN AEROBIC AND ANAEROBIC Blood Culture adequate volume   Culture   Final    NO GROWTH 5 DAYS Performed at [facility], [address], [place], [place] [address]    Report Status 11/30/2023 FINAL  Final  Culture, blood (x 2)     Status: None   Collection Time: 11/25/23 10:31 PM   Specimen: BLOOD  Result Value Ref Range  Status   Specimen Description BLOOD BLOOD RIGHT ARM  Final   Special Requests   Final    BOTTLES DRAWN AEROBIC AND ANAEROBIC Blood Culture adequate volume   Culture   Final    NO GROWTH 5 DAYS Performed at [facility], [address] [place], [place] [address]    Report Status 11/30/2023 FINAL  Final  Gastrointestinal Panel by PCR , Stool     Status: None   Collection Time: 11/30/23  5:10 PM   Specimen: Stool  Result Value Ref Range Status   Campylobacter species NOT DETECTED NOT DETECTED Final   Plesimonas shigelloides NOT DETECTED NOT DETECTED Final   Salmonella species NOT DETECTED NOT DETECTED Final   Yersinia enterocolitica NOT DETECTED NOT DETECTED Final   Vibrio species NOT DETECTED NOT DETECTED Final   Vibrio cholerae NOT DETECTED  NOT DETECTED Final   Enteroaggregative E coli (EAEC) NOT DETECTED NOT DETECTED Final   Enteropathogenic E coli (EPEC) NOT DETECTED NOT DETECTED Final   Enterotoxigenic E coli (ETEC) NOT DETECTED NOT DETECTED Final   Shiga like toxin producing E coli (STEC) NOT DETECTED NOT DETECTED Final   Shigella/Enteroinvasive E coli (EIEC) NOT DETECTED NOT DETECTED Final   Cryptosporidium NOT DETECTED NOT DETECTED Final   Cyclospora cayetanensis NOT DETECTED NOT DETECTED Final   Entamoeba histolytica NOT DETECTED NOT DETECTED Final   Giardia lamblia NOT DETECTED NOT DETECTED Final   Adenovirus F40/41 NOT DETECTED NOT DETECTED Final   Astrovirus NOT DETECTED NOT DETECTED Final   Norovirus GI/GII NOT DETECTED NOT DETECTED Final   Rotavirus A NOT DETECTED NOT DETECTED Final   Sapovirus (I, II, IV, and V) NOT DETECTED NOT DETECTED Final    Comment: Performed at [facility], [address] [place], [place] [address]    Coagulation Studies: No results for input(s): LABPROT, INR in the last 72 hours.   Urinalysis: No results for input(s): COLORURINE, LABSPEC, PHURINE, GLUCOSEU, HGBUR, BILIRUBINUR, KETONESUR,  PROTEINUR, UROBILINOGEN, NITRITE, LEUKOCYTESUR in the last 72 hours.  Invalid input(s): APPERANCEUR     Imaging: No results found.        Medications:    albumin  human 25 g (12/09/23 1054)    sodium chloride    Intravenous Once   Chlorhexidine  Gluconate Cloth  6 each Topical Q0600   epoetin  alfa-epbx (RETACRIT ) injection  10,000 Units Intravenous Q M,W,F-HD   feeding supplement (NEPRO CARB STEADY)  237 mL Oral 5 X Daily   feeding supplement (PROSource TF20)  60 mL Per Tube Daily   ferrous sulfate   325 mg Per Tube Daily   free water   120 mL Per Tube 5 X Daily   gentamicin  ointment   Topical TID   heparin  injection (subcutaneous)  5,000 Units Subcutaneous Q8H   insulin  aspart  10 Units Subcutaneous Once   midodrine   5 mg Per Tube TID WC   multivitamin  1 tablet Per Tube QHS   pantoprazole  (PROTONIX ) IV  40 mg Intravenous Q12H   saccharomyces boulardii  250 mg Per Tube BID   acetaminophen  **OR** acetaminophen , albumin  human, artificial tears, bisacodyl , ipratropium-albuterol , [DISCONTINUED] ondansetron  **OR** ondansetron  (ZOFRAN ) IV, mouth rinse, sennosides  Assessment/ Plan:  Mr. Robert Vega is a 83 y.o.  male with obstructive sleep apnea, GERD, obesity, hypertension, dysphagia, severe esophageal dysmotility with achalasia status post PEG tube placement, recent aspiration pneumonia acute respiratory failure status post extubation 10/09/2023, who was admitted to [facility] on 10/05/2023 for Aspiration into respiratory tract, initial encounter [T17.908A] Severe sepsis (HCC) [A41.9, R65.20] History of dysphagia [Z87.898] Fever, unspecified fever cause [R50.9] Multifocal pneumonia [J18.9]   1.  Acute kidney injury. Requiring hemodialysis. With obstructive uropathy Baseline creatinine 0.81 from 10/14/2023.  Acute kidney injury secondary to ATN, obstructive uropathy, and progression of prerenal azotemia.   Patient is critically ill.  He was started on hemodialysis for severe  hyperkalemia and uremia. Vascular surgery placed PermCath on 6/26.  - TOC and dialysis coordinator continue search for rehab.  - Next treatment scheduled for Thursday.  - Accepted at [facility] on a TTS schedule, chair time 7:10am.    2.  Sepsis and hypotension due to aspiration pneumonia, found to have distal esophageal obstruction with severe dysmotility.  He is status post botulinum toxin injection to the lower esophageal sphincter.  PEG tube placed this admission. Receiving continuous tube feeds with scheduled free  water  flushes.  - Continue midodrine  - Completed Zosyn   4.Anemia in the setting of renal failure Lab Results  Component Value Date   HGB 8.2 (L) 12/11/2023  - Continue IV EPO  with HD treatments.  -Hemoglobin 8.2, blood transfusion received on 7/15  5.  Bilateral hydronephrosis Noted on CT.Urology team has evaluated the patient and it is thought to be secondary to reflux.  Due to health, further intervention deferred at this time.     LOS: 67 Roshaun Pound 7/16/202511:32 AM

## 2023-12-11 NOTE — Progress Notes (Signed)
 Date of Admission:  10/05/2023      ID: Robert Vega is a 83 y.o. male Principal Problem:   Severe sepsis (HCC) Active Problems:   Encounter for dialysis and dialysis catheter care (HCC)   GERD (gastroesophageal reflux disease)   Dysphagia   Essential hypertension, benign   Multifocal pneumonia   Acute respiratory failure with hypoxia and hypercapnia (HCC)   History of dysphagia   Aspiration pneumonia (HCC)   Achalasia of esophagus   Acute delirium   Obesity (BMI 30-39.9)   Generalized weakness   Gastrostomy complication (HCC)   AKI (acute kidney injury) (HCC)   Generalized abdominal pain   Malnutrition of moderate degree   Melena   Leukocytosis   Perinephric hematoma   Hydronephrosis   5/10 admission for weakness, hypoxia- aspiration pneumonia due to achlasia  5/10 CT b/l infitrates- intubated- extubated 5/14 Dilated esophagus 5/13 PEG instertion by GI 5/20 repeat EGD botulinum injection 5/25 pulled PEG out 5/27 exp lap for dislodged peg in the abdominal cavity 6/25melena 6/10 EGD 6/16 HD cath- dialysis started 6/26 permanent HD cath  Antibiotic therapy Ceftriaxone  5/10-5/14 Azithro 5/10-5/11 Metronidazole  5/10 Zosyn  5/26-5/31 Cefepime  6/30-7/3 Zosyn  7/3 >>7/15    Subjective: NA   Medications:   sodium chloride    Intravenous Once   Chlorhexidine  Gluconate Cloth  6 each Topical Q0600   epoetin  alfa-epbx (RETACRIT ) injection  10,000 Units Intravenous Q M,W,F-HD   feeding supplement (NEPRO CARB STEADY)  237 mL Oral 5 X Daily   feeding supplement (PROSource TF20)  60 mL Per Tube Daily   ferrous sulfate   325 mg Per Tube Daily   free water   120 mL Per Tube 5 X Daily   gentamicin  ointment   Topical TID   heparin  injection (subcutaneous)  5,000 Units Subcutaneous Q8H   insulin  aspart  10 Units Subcutaneous Once   midodrine   5 mg Per Tube TID WC   multivitamin  1 tablet Per Tube QHS   pantoprazole  (PROTONIX ) IV  40 mg Intravenous Q12H   saccharomyces  boulardii  250 mg Per Tube BID    Objective: Vital signs in last 24 hours: Patient Vitals for the past 24 hrs:  BP Temp Temp src Pulse Resp SpO2  12/11/23 0839 126/69 (!) 97.4 F (36.3 C) Oral 79 20 98 %  12/11/23 0428 (!) 117/58 97.9 F (36.6 C) Axillary 80 (!) 22 97 %  12/10/23 1951 132/62 98.1 F (36.7 C) Axillary 79 20 98 %  12/10/23 1800 -- -- -- -- (!) 24 --  12/10/23 1700 122/60 97.8 F (36.6 C) Axillary 78 (!) 28 95 %  12/10/23 1637 129/61 98.3 F (36.8 C) -- 79 18 92 %     LDA HD cath PHYSICAL EXAM:  General: lethargic,  Lungs:b/l air entry-  Heart: Regular rate and rhythm, no murmur, rub or gallop. Abdomen: Soft, peg Rectal tube CNS unable to assess Lab Results    Latest Ref Rng & Units 12/11/2023    4:21 AM 12/10/2023    9:13 PM 12/10/2023    3:34 AM  CBC  WBC 4.0 - 10.5 K/uL 13.1   15.0   Hemoglobin 13.0 - 17.0 g/dL 8.2  7.9  7.0   Hematocrit 39.0 - 52.0 % 26.4  25.5  22.9   Platelets 150 - 400 K/uL 380   374        Latest Ref Rng & Units 12/11/2023    4:21 AM 12/10/2023    3:34 AM 12/09/2023  5:53 AM  CMP  Glucose 70 - 99 mg/dL 890  896  95   BUN 8 - 23 mg/dL 78  49  86   Creatinine 0.61 - 1.24 mg/dL 4.11  5.53  2.83   Sodium 135 - 145 mmol/L 133  134  134   Potassium 3.5 - 5.1 mmol/L 4.0  3.8  4.1   Chloride 98 - 111 mmol/L 95  95  96   CO2 22 - 32 mmol/L 24  28  23    Calcium  8.9 - 10.3 mg/dL 8.1  8.1  7.9   Total Protein 6.5 - 8.1 g/dL  7.7    Total Bilirubin 0.0 - 1.2 mg/dL  0.4    Alkaline Phos 38 - 126 U/L  76    AST 15 - 41 U/L  24    ALT 0 - 44 U/L  17      Wbc 25.4>14.8  Microbiology: 5/10 BC NG 5/27 BC NG 6/14 BC- NG 6/30 BC NG Studies/Results:  B/l infitrates   Assessment/Plan:  Prolonged and Complicated hospital stay.  Encephalopathy- seen by neuro - ( TSH, cortisol, N, B12 N)  Aspiration pneumonia Hypoxia - intubated and then extubated Achalasia, dilated esophagus para esophageal hernia  PEG  Dislodgement and  peritonitis- exp lap on 5/27 Peg in place  Leucocytosis- multifactorial- reactive to hydronephrosis, perinephric stranding - possible infection,  perinephric hematoma, aspiration pneumonia,  Been on antibiotics sine 6/30 - zosyn  started on 7/3 -day 13 improvement in leucocytosis- 25.4>13.1  DC zosyn     B/l hydroureteronephrosis- unclear etiology- bladder wall thickening. Did he have BOO? No calculus CT scan repeated showed proteinaceous hemorrhagic material in kidneys- Will he need perc nephrostomy?  Left perinephric hematoma ? etiology Urology following- if wbc worsens would need repeat imaging and stents VS PCN  AKI since 5/26 now on dialysis  I discussed with his grand daughter yesterday

## 2023-12-11 NOTE — Progress Notes (Signed)
 EEG HU, no initial skin breakdown

## 2023-12-11 NOTE — Plan of Care (Signed)

## 2023-12-11 NOTE — Progress Notes (Signed)
 Progress Note   Patient: Robert Vega FMW:[id number] DOB: 1940/11/14 DOA: 10/05/2023     67 DOS: the patient was seen and examined on 12/11/2023   Brief hospital course: 83 yo with h/o OSA, GERD, HTN, class 1 obesity, and dysphagia who presented on 5/10 with fever and hypoxia.  He was diagnosed with sepsis due to PNA with possible aspiration and effectively treated with antibiotics.  He had an aspiration event that led to cardiac arrest on 5/10 and was transferred to the ICU intubated after successful resuscitation.  He also had prior EGD in 03/2022 with food in upper esophagus complicated by aspiration event, cardiac arrest with round of CPR, and post resuscitation EGD with concern for lack of peristalsis. Hx prior EGD 08/2020 with note of abnormal cricopharyngeus, decrease in motility in esophagus, and spastic LES.  He was extubated on 5/14 but had severe delirium.  He underwent botox  injection into the LES on 5/20.  5/21 esophagram with diffusely distended esophagus with distal obstruction and severe dysmotility suggestive of achalasia.   Assessment and Plan:  Severe sepsis due to aspiration PNA with acute hypoxic respiratory failure - resolved  Initially w/ aspiration pna Later in hospital stay w/ intra-abdominal infection secondary to PEG tube not being in the right place Most recently with unexplained leukocytosis with negative blood cultures and urine culture with 60k colonies of Enterococcus He has completed antibiotics, most recently with Zosyn  Leukocytosis is improving   ESRD Initially with AKI on CKD Developed worsening renal failure with hyperkalemia, hyponatremia, and metabolic acidosis Started on HD with improvement (other than encephalopathy - see below)  Encephalopathy S/p cardiac arrest during this hospitalization Mental status appeared to be improving but recently has deteriorated Neurology is consulting and has ordered labs and EEG NH4 is mildly elevated but he appears  unlikely to tolerate lactulose  given his loose BMs Possibly uremic encephalopathy, as his BUN has not effectively cleared despite HD  Ileus Tube feeds were held but have been restarted He has been stooling for several days and has rectal tube with significant output He is unlikely to tolerate lactulose  His granddaughter is hopeful that the diarrhea is associated with antibiotics and prior constipation He is on probiotics  Achlasia/dysphagia PEG tube was placed, getting tube feeds S/p Botox  in the LES on 5/20 Underwent EGD on 6/10 and GI has signed off High risk for aspiration should he be offered PO foods in the future Will need outpatient referral to tertiary advanced GI for consideration of POEMS procedure.  Recommend this only after a few weeks recovery period.   GERD (gastroesophageal reflux disease) Continue PPI   Essential hypertension, benign BP normal off medications  Generalized weakness Therapy as tolerated Family aware he will have to sit up reliable before would be a good candidate for long term dialysis  He appears to be most appropriate for LTACH, possibly closer to family in [place]; this would allow them to be more involved in his care and to more closely monitor his progress and make GOC decisions as needed  Class 1 obesity Body mass index is 30.69 kg/m.SABRA  Weight loss should be encouraged Outpatient PCP/bariatric medicine f/u encouraged Significantly low or high BMI is associated with higher medical risk including morbidity and mortality   Nutrition Status: Nutrition Problem: Moderate Malnutrition Etiology: acute illness Signs/Symptoms: mild fat depletion, mild muscle depletion, moderate muscle depletion, percent weight loss Interventions: Tube feeding   Goals of care  Previously, Son [name] was to be the primary point of  contact and [name] confirmed that the patient would want CPR and life support, if needed Subsequently, his granddaughter [name] (a physical  therapist in [place]) became the designated HCPOA [name] is adamant that he would want full supportive and resuscitative measures and would not want hospice at this time Palliative care is following peripherally      Consultants: PCCM GI Nephrology Vascular surgery Surgery Urology (telephone only) Neurology Palliative care SLP OT PT [facility] team Dietician  Procedures: Bronchoscopy 5/11 PEG tube placement 5/13, 5/2, 5/27 EGD with botox  injection into LES 5/20 Ex lap 5/27 EGD 6/10 TDC placement 6/16 HD 6/16- Perma catheter insertion 6/26  Antibiotics: Unasyn  6/16-20 Azithromycin  x 1 Cefepime  6/30-7/3 Ceftriaxone  5/10-14 Metronidazole  5/10, 6/30-7/3 Zosyn  6/11, 5/26-31, 7/3-15 Vancomycin  6/30-7/5    30 Day Unplanned Readmission Risk Score    Flowsheet Row ED to Hosp-Admission (Current) from 10/05/20[facility]  30 Day Unplanned Readmission Risk Score (%) 24.84 Filed at 12/11/2023 0801    This score is the patient's risk of an unplanned readmission within 30 days of being discharged (0 -100%). The score is based on dignosis, age, lab data, medications, orders, and past utilization.   Low:  0-14.9   Medium: 15-21.9   High: 22-29.9   Extreme: 30 and above           Subjective: Very lethargic, looks chronically ill and quite ill/frail.  I spoke with his granddaughter.  She last saw him a few weeks ago - he was just starting HD, could make conversation, taking some steps with PT.  He got septic then and got sicker.  Prior to hospitalism - lived alone, drove, volunteered at church, sang in the choir.  Has a PEG tube now.   He told her that he wanted anything and everything possible to get me better in the event anything happens.  She does not want to go the hospice route.  She is planning to come back to see him again to see if he perks up.     Objective: Vitals:   12/11/23 0839 12/11/23 1452  BP: 126/69 (!) 112/47  Pulse:  79 75  Resp: 20 (!) 24  Temp: (!) 97.4 F (36.3 C)   SpO2: 98% 96%    Intake/Output Summary (Last 24 hours) at 12/11/2023 1820 Last data filed at 12/11/2023 1816 Gross per 24 hour  Intake 1442 ml  Output 1075 ml  Net 367 ml   Filed Weights   11/29/23 0830 11/29/23 1230 12/02/23 1726  Weight: 96.8 kg 95.3 kg 94.3 kg    Exam:  General:  Appears ill, answers limited questions with slurred/groggy speech Eyes:   normal lids, iris ENT:  grossly normal hearing, lips & tongue, mmm Cardiovascular:  RRR. No LE edema.  Respiratory:   CTA bilaterally with no wheezes/rales/rhonchi.  Normal respiratory effort. Abdomen:  soft, NT, ND Skin:  no rash or induration seen on limited exam Musculoskeletal:  generalized weakness Psychiatric:  lethargic mood and affect, speech sparse Neurologic:  unable to effectively perform  Data Reviewed: I have reviewed the patient's lab results since admission.  Pertinent labs for today include:   Glucose 109 BUN 78/Creatinine 5.88/GFR 9 NH4 54 WBC 13.1 Hgb 8.2, improved     Family Communication: None present; I spoke with his granddaughter by telephone  Disposition: Status is: Inpatient Remains inpatient appropriate because: ongoing management     Time spent: 50 minutes  Unresulted Labs (From admission, onward)     Start  Ordered   12/12/23 0500  CBC with Differential/Platelet  Daily,   R     Question:  Specimen collection method  Answer:  Lab=Lab collect   12/11/23 1820   12/12/23 0500  Basic metabolic panel with GFR  Daily,   R     Question:  Specimen collection method  Answer:  Lab=Lab collect   12/11/23 1820   Signed and Held  Renal function panel  Once,   R       Question:  Specimen collection method  Answer:  Lab=Lab collect   Signed and Held   Signed and Held  Renal function panel  Once,   R       Question:  Specimen collection method  Answer:  Lab=Lab collect   Signed and Held   Signed and Held  Renal function panel   Tomorrow morning,   R       Question:  Specimen collection method  Answer:  Lab=Lab collect   Signed and Held   Signed and Held  Renal function panel  Tomorrow morning,   R       Question:  Specimen collection method  Answer:  Lab=Lab collect   Signed and Held   Signed and Held  CBC  Tomorrow morning,   R       Question:  Specimen collection method  Answer:  Lab=Lab collect   Signed and Held   Signed and Held  Renal function panel  Once,   R       Question:  Specimen collection method  Answer:  Lab=Lab collect   Signed and Held   Signed and Held  CBC  Once,   R       Question:  Specimen collection method  Answer:  Lab=Lab collect   Signed and Held             Author: Delon Herald, MD 12/11/2023 6:20 PM  For on call review [email or web address].

## 2023-12-12 DIAGNOSIS — J690 Pneumonitis due to inhalation of food and vomit: Secondary | ICD-10-CM | POA: Diagnosis not present

## 2023-12-12 DIAGNOSIS — G9340 Encephalopathy, unspecified: Secondary | ICD-10-CM | POA: Diagnosis not present

## 2023-12-12 DIAGNOSIS — T17908A Unspecified foreign body in respiratory tract, part unspecified causing other injury, initial encounter: Secondary | ICD-10-CM | POA: Diagnosis not present

## 2023-12-12 DIAGNOSIS — E7220 Disorder of urea cycle metabolism, unspecified: Secondary | ICD-10-CM

## 2023-12-12 DIAGNOSIS — Z87898 Personal history of other specified conditions: Secondary | ICD-10-CM | POA: Diagnosis not present

## 2023-12-12 DIAGNOSIS — A419 Sepsis, unspecified organism: Secondary | ICD-10-CM | POA: Diagnosis not present

## 2023-12-12 DIAGNOSIS — J189 Pneumonia, unspecified organism: Secondary | ICD-10-CM | POA: Diagnosis not present

## 2023-12-12 DIAGNOSIS — K220 Achalasia of cardia: Secondary | ICD-10-CM | POA: Diagnosis not present

## 2023-12-12 DIAGNOSIS — R6520 Severe sepsis without septic shock: Secondary | ICD-10-CM | POA: Diagnosis not present

## 2023-12-12 DIAGNOSIS — R0902 Hypoxemia: Secondary | ICD-10-CM | POA: Diagnosis not present

## 2023-12-12 DIAGNOSIS — D649 Anemia, unspecified: Secondary | ICD-10-CM

## 2023-12-12 LAB — CBC WITH DIFFERENTIAL/PLATELET
Abs Immature Granulocytes: 0.21 K/uL — ABNORMAL HIGH (ref 0.00–0.07)
Basophils Absolute: 0.1 K/uL (ref 0.0–0.1)
Basophils Relative: 1 %
Eosinophils Absolute: 0.2 K/uL (ref 0.0–0.5)
Eosinophils Relative: 2 %
HCT: 25.2 % — ABNORMAL LOW (ref 39.0–52.0)
Hemoglobin: 8 g/dL — ABNORMAL LOW (ref 13.0–17.0)
Immature Granulocytes: 2 %
Lymphocytes Relative: 7 %
Lymphs Abs: 0.9 K/uL (ref 0.7–4.0)
MCH: 29.2 pg (ref 26.0–34.0)
MCHC: 31.7 g/dL (ref 30.0–36.0)
MCV: 92 fL (ref 80.0–100.0)
Monocytes Absolute: 1.7 K/uL — ABNORMAL HIGH (ref 0.1–1.0)
Monocytes Relative: 13 %
Neutro Abs: 10.1 K/uL — ABNORMAL HIGH (ref 1.7–7.7)
Neutrophils Relative %: 75 %
Platelets: 358 K/uL (ref 150–400)
RBC: 2.74 MIL/uL — ABNORMAL LOW (ref 4.22–5.81)
RDW: 16.1 % — ABNORMAL HIGH (ref 11.5–15.5)
WBC: 13.1 K/uL — ABNORMAL HIGH (ref 4.0–10.5)
nRBC: 0 % (ref 0.0–0.2)

## 2023-12-12 LAB — GLUCOSE, CAPILLARY
Glucose-Capillary: 113 mg/dL — ABNORMAL HIGH (ref 70–99)
Glucose-Capillary: 116 mg/dL — ABNORMAL HIGH (ref 70–99)
Glucose-Capillary: 101 mg/dL — ABNORMAL HIGH (ref 70–99)
Glucose-Capillary: 107 mg/dL — ABNORMAL HIGH (ref 70–99)

## 2023-12-12 LAB — BASIC METABOLIC PANEL WITH GFR
Anion gap: 14 (ref 5–15)
BUN: 92 mg/dL — ABNORMAL HIGH (ref 8–23)
CO2: 24 mmol/L (ref 22–32)
Calcium: 8.3 mg/dL — ABNORMAL LOW (ref 8.9–10.3)
Chloride: 95 mmol/L — ABNORMAL LOW (ref 98–111)
Creatinine, Ser: 7.28 mg/dL — ABNORMAL HIGH (ref 0.61–1.24)
GFR, Estimated: 7 mL/min — ABNORMAL LOW (ref >60)
Glucose, Bld: 114 mg/dL — ABNORMAL HIGH (ref 70–99)
Potassium: 4.1 mmol/L (ref 3.5–5.1)
Sodium: 133 mmol/L — ABNORMAL LOW (ref 135–145)

## 2023-12-12 MED ORDER — CYANOCOBALAMIN 1000 MCG/ML IJ SOLN
1000.0000 ug | Freq: Once | INTRAMUSCULAR | Status: AC
  Administered 2023-12-12: 1000 ug via INTRAMUSCULAR
  Filled 2023-12-12: qty 1

## 2023-12-12 MED ORDER — EPOETIN ALFA-EPBX 10000 UNIT/ML IJ SOLN
INTRAMUSCULAR | Status: AC
  Filled 2023-12-12: qty 1

## 2023-12-12 NOTE — Progress Notes (Signed)
 NEUROLOGY CONSULT FOLLOW UP NOTE   Date of service: December 12, 2023 Patient Name: Robert Vega MRN:  [id number] DOB:  07-Sep-1940  Interval Hx/subjective   Had dialysis this morning Vitals   Vitals:   12/12/23 1200 12/12/23 1305 12/12/23 1405 12/12/23 1740  BP: 137/66 (!) 149/68 138/87 (!) 151/67  Pulse: 75 88 88 89  Resp: (!) 23 (!) 24 20 (!) 22  Temp:  98.1 F (36.7 C) 98.1 F (36.7 C) 98.2 F (36.8 C)  TempSrc:  Axillary Axillary Axillary  SpO2: 100% 97% 98% 97%  Weight: 94.1 kg     Height:         Body mass index is 30.62 kg/m.  Physical Exam   Constitutional: Appears well-developed and well-nourished.  Neurologic Examination    MS: Patient is awake, he does look at me, when I ask him how he is doing he answers not much when I ask him to follow commands he states no he grimaces and briskly localizes to noxious stimulation, but does not follow commands CN: He blinks to threat bilaterally, pupils are reactive bilaterally, face is relatively symmetric Motor: He moves all extremities to noxious stimulation Sensory: As above  Medications  Current Facility-Administered Medications:    0.9 %  sodium chloride  infusion (Manually program via Guardrails IV Fluids), , Intravenous, Once, Dew, Selinda RAMAN, MD   acetaminophen  (TYLENOL ) tablet 650 mg, 650 mg, Per Tube, Q6H PRN **OR** acetaminophen  (TYLENOL ) suppository 650 mg, 650 mg, Rectal, Q6H PRN, Barbarann Nest, MD   albumin  human 25 % solution 25 g, 25 g, Intravenous, Q dialysis, Breeze, Shantelle, NP, Last Rate: 60 mL/hr at 12/09/23 1054, 25 g at 12/09/23 1054   artificial tears ophthalmic solution 1 drop, 1 drop, Both Eyes, QID PRN, Marea, Selinda RAMAN, MD   bisacodyl  (DULCOLAX) suppository 10 mg, 10 mg, Rectal, Daily PRN, Marea Selinda RAMAN, MD, 10 mg at 11/26/23 2259   Chlorhexidine  Gluconate Cloth 2 % PADS 6 each, 6 each, Topical, Q0600, Levorn Ramonita SQUIBB, NP, 6 each at 12/12/23 0519   epoetin  alfa-epbx (RETACRIT ) injection  10,000 Units, 10,000 Units, Intravenous, Q ONEIDA Valeta Coralie Barbarann Nest, MD, 10,000 Units at 12/12/23 0911   feeding supplement (NEPRO CARB STEADY) liquid 237 mL, 237 mL, Per Tube, 5 X Daily, Barbarann Nest, MD, 237 mL at 12/12/23 1744   feeding supplement (PROSource TF20) liquid 60 mL, 60 mL, Per Tube, Daily, Trudy Anthony HERO, MD, 60 mL at 12/11/23 0958   ferrous sulfate  220 (44 Fe) MG/5ML solution 325 mg, 325 mg, Per Tube, Daily, Trudy Anthony HERO, MD, 325 mg at 12/12/23 1431   free water  120 mL, 120 mL, Per Tube, 5 X Daily, Trudy Anthony M, MD, 120 mL at 12/12/23 1738   gentamicin  ointment (GARAMYCIN ) 0.1 %, , Topical, TID, Dew, Selinda RAMAN, MD, Given at 12/12/23 1738   heparin  injection 5,000 Units, 5,000 Units, Subcutaneous, Q8H, Dew, Jason S, MD, 5,000 Units at 12/12/23 1432   insulin  aspart (novoLOG ) injection 10 Units, 10 Units, Subcutaneous, Once, Dew, Selinda RAMAN, MD   ipratropium-albuterol  (DUONEB) 0.5-2.5 (3) MG/3ML nebulizer solution 3 mL, 3 mL, Nebulization, Q4H PRN, Marea Selinda RAMAN, MD, 3 mL at 11/30/23 9472   midodrine  (PROAMATINE ) tablet 5 mg, 5 mg, Per Tube, TID WC, Barbarann Nest, MD, 5 mg at 12/11/23 1820   multivitamin (RENA-VIT) tablet 1 tablet, 1 tablet, Per Tube, QHS, Dew, Jason S, MD, 1 tablet at 12/11/23 2304   [DISCONTINUED] ondansetron  (ZOFRAN ) tablet 4 mg, 4 mg, Oral, Q6H  PRN **OR** ondansetron  (ZOFRAN ) injection 4 mg, 4 mg, Intravenous, Q6H PRN, Marea, Selinda RAMAN, MD, 4 mg at 10/05/23 1713   Oral care mouth rinse, 15 mL, Mouth Rinse, PRN, Dew, Selinda RAMAN, MD   pantoprazole  (PROTONIX ) injection 40 mg, 40 mg, Intravenous, Q12H, Dew, Jason S, MD, 40 mg at 12/11/23 2304   saccharomyces boulardii (FLORASTOR) capsule 250 mg, 250 mg, Per Tube, BID, Ravishankar, Donald, MD, 250 mg at 12/11/23 2304   sennosides (SENOKOT) 8.8 MG/5ML syrup 5 mL, 5 mL, Per Tube, QHS PRN, Trudy Anthony HERO, MD  Labs and Diagnostic Imaging   B12 is 330 Cortisol 11.1 TSH 3.5 Ammonia is 50 BUN  today is 92 WBC is 13.1  Imaging(Personally reviewed): MRI from 07/08 - unremarkable  EEG from 07/16 - diffusely slow, no seizure    Assessment   OLSEN MCCUTCHAN is a 83 y.o. male who was admitted with severe sepsis and early May and intubated due to respiratory failure.  Of note, I I do not see any record of him having a cardiac arrest with this admission, I suspect this was carried forward from a cardiac arrest he had in 2023.  He did have a period of shock in early May, but the lowest pressures I see were very brief and in the 37s on May 11.  He had another very brief episode of hypotension on June 30 with his pressures getting down to 74/41 but this lasted only for minutes.   He has been started on dialysis, but his BUN has remained persistently elevated with a BUN of 92 today.  He also has a mildly elevated ammonia at 50, though it is unclear to me how much this could be contributing to his current mental status.  I suspect that his encephalopathy remains multifactorial and metabolic in nature with uremia, possible hyperammonemia, possible persistent infection and inflammation, persistent anemia requiring repeated transfusions, prolonged hospitalization all playing a role.   Recommendations  I would continue to optimize his medical management with attention to controlling his uremia and making sure to treat any underlying infections. Neurology will continue to be available as needed, please call with further questions or concerns.  ______________________________________________________________________   Bonney Aisha Seals, MD [facility]

## 2023-12-12 NOTE — Procedures (Signed)
 History: 82 year old male with encephalopathy with EEG being performed to rule out any type of seizure activity contributing  EEG Duration: 23 minutes  Sedation: none  Patient State: Awake and asleep  Technique: This EEG was acquired with electrodes placed according to the International 10-20 electrode system (including Fp1, Fp2, F3, F4, C3, C4, P3, P4, O1, O2, T3, T4, T5, T6, A1, A2, Fz, Cz, Pz). The following electrodes were missing or displaced: none.   Background: The background consists of generalized intermixed irregular delta and theta range activities. With arousal, there is an increase in faster frequencies, but no definite posterior dominant rhythm is identified. There were no epileptiform discharges.   Photic stimulation: Physiologic driving is not performed  EEG Abnormalities: 1) generalized irregular slow activity 2) absent posterior dominant rhythm   Clinical Interpretation: This normal EEG is recorded in the waking and sleep state. There was no seizure or seizure predisposition recorded on this study. Please note that lack of epileptiform activity on EEG does not preclude the possibility of epilepsy.   Aisha Seals, MD [facility]   If 7pm- 7am, please page neurology on call as listed in [facility]ION.

## 2023-12-12 NOTE — Plan of Care (Signed)
 Problem: Education: Goal: Knowledge of General Education information will improve Description: Including pain rating scale, medication(s)/side effects and non-pharmacologic comfort measures Outcome: Progressing   Problem: Health Behavior/Discharge Planning: Goal: Ability to manage health-related needs will improve Outcome: Progressing   Problem: Clinical Measurements: Goal: Ability to maintain clinical measurements within normal limits will improve Outcome: Progressing Goal: Will remain free from infection Outcome: Progressing Goal: Diagnostic test results will improve Outcome: Progressing Goal: Respiratory complications will improve Outcome: Progressing Goal: Cardiovascular complication will be avoided Outcome: Progressing   Problem: Activity: Goal: Risk for activity intolerance will decrease Outcome: Progressing   Problem: Nutrition: Goal: Adequate nutrition will be maintained Outcome: Progressing   Problem: Coping: Goal: Level of anxiety will decrease Outcome: Progressing   Problem: Elimination: Goal: Will not experience complications related to bowel motility Outcome: Progressing   Problem: Pain Managment: Goal: General experience of comfort will improve and/or be controlled Outcome: Progressing   Problem: Safety: Goal: Ability to remain free from injury will improve Outcome: Progressing   Problem: Skin Integrity: Goal: Risk for impaired skin integrity will decrease Outcome: Progressing   Problem: Activity: Goal: Ability to tolerate increased activity will improve Outcome: Progressing   Problem: Clinical Measurements: Goal: Ability to maintain a body temperature in the normal range will improve Outcome: Progressing   Problem: Respiratory: Goal: Ability to maintain adequate ventilation will improve Outcome: Progressing Goal: Ability to maintain a clear airway will improve Outcome: Progressing   Problem: Education: Goal: Ability to describe  self-care measures that may prevent or decrease complications (Diabetes Survival Skills Education) will improve Outcome: Progressing Goal: Individualized Educational Video(s) Outcome: Progressing   Problem: Coping: Goal: Ability to adjust to condition or change in health will improve Outcome: Progressing   Problem: Fluid Volume: Goal: Ability to maintain a balanced intake and output will improve Outcome: Progressing   Problem: Health Behavior/Discharge Planning: Goal: Ability to identify and utilize available resources and services will improve Outcome: Progressing Goal: Ability to manage health-related needs will improve Outcome: Progressing   Problem: Metabolic: Goal: Ability to maintain appropriate glucose levels will improve Outcome: Progressing   Problem: Nutritional: Goal: Maintenance of adequate nutrition will improve Outcome: Progressing Goal: Progress toward achieving an optimal weight will improve Outcome: Progressing   Problem: Skin Integrity: Goal: Risk for impaired skin integrity will decrease Outcome: Progressing   Problem: Tissue Perfusion: Goal: Adequacy of tissue perfusion will improve Outcome: Progressing   Problem: Activity: Goal: Ability to tolerate increased activity will improve Outcome: Progressing   Problem: Respiratory: Goal: Ability to maintain a clear airway and adequate ventilation will improve Outcome: Progressing   Problem: Role Relationship: Goal: Method of communication will improve Outcome: Progressing   Problem: Fluid Volume: Goal: Hemodynamic stability will improve Outcome: Progressing   Problem: Clinical Measurements: Goal: Diagnostic test results will improve Outcome: Progressing Goal: Signs and symptoms of infection will decrease Outcome: Progressing   Problem: Respiratory: Goal: Ability to maintain adequate ventilation will improve Outcome: Progressing   Problem: Education: Goal: Knowledge of disease and its  progression will improve Outcome: Progressing   Problem: Health Behavior/Discharge Planning: Goal: Ability to manage health-related needs will improve Outcome: Progressing   Problem: Clinical Measurements: Goal: Complications related to the disease process or treatment will be avoided or minimized Outcome: Progressing Goal: Dialysis access will remain free of complications Outcome: Progressing   Problem: Activity: Goal: Activity intolerance will improve Outcome: Progressing   Problem: Fluid Volume: Goal: Fluid volume balance will be maintained or improved Outcome: Progressing   Problem: Nutritional: Goal:  Ability to make appropriate dietary choices will improve Outcome: Progressing

## 2023-12-12 NOTE — Progress Notes (Addendum)
 [facility]  ROUNDING NOTE   Subjective:   Patient seen and evaluated on dialysis   HEMODIALYSIS FLOWSHEET:  Blood Flow Rate (mL/min): 399 mL/min Arterial Pressure (mmHg): -181.61 mmHg Venous Pressure (mmHg): 182.21 mmHg TMP (mmHg): -0.8 mmHg Ultrafiltration Rate (mL/min): 543 mL/min Dialysate Flow Rate (mL/min): 299 ml/min Dialysis Fluid Bolus: Normal Saline Bolus Amount (mL): 100 mL  Unresponsive today Tolerating treatment well  Objective:  Vital signs in last 24 hours:  Temp:  [97.7 F (36.5 C)-98.3 F (36.8 C)] 98 F (36.7 C) (07/17 0838) Pulse Rate:  [69-80] 80 (07/17 1100) Resp:  [20-28] 20 (07/17 1100) BP: (112-138)/(47-67) 134/63 (07/17 1100) SpO2:  [96 %-100 %] 100 % (07/17 1100) Weight:  [95.1 kg] 95.1 kg (07/17 0838)  Weight change:  Filed Weights   11/29/23 1230 12/02/23 1726 12/12/23 0838  Weight: 95.3 kg 94.3 kg 95.1 kg    Intake/Output: I/O last 3 completed shifts: In: 1682 [Blood:368; Other:474; NG/GT:840] Out: 1075 [Stool:1075]   Intake/Output this shift:  No intake/output data recorded.  Physical Exam: General: Chronically ill appearing  Head: Oral mucosa moist  Eyes: Anicteric  Lungs:  Diminished  Heart: Regular rate and rhythm  Abdomen:  Peg tube in place, +distended  Extremities: no peripheral edema.  Neurologic: Arousable  Skin: No lesions  Access: Rt internal jugular permcath    Basic Metabolic Panel: Recent Labs  Lab 12/08/23 0529 12/09/23 0553 12/10/23 0334 12/11/23 0421 12/12/23 0505  NA 132* 134* 134* 133* 133*  K 3.8 4.1 3.8 4.0 4.1  CL 93* 96* 95* 95* 95*  CO2 25 23 28 24 24   GLUCOSE 104* 95 103* 109* 114*  BUN 62* 86* 49* 78* 92*  CREATININE 5.45* 7.16* 4.46* 5.88* 7.28*  CALCIUM  7.9* 7.9* 8.1* 8.1* 8.3*    Liver Function Tests: Recent Labs  Lab 12/10/23 0334  AST 24  ALT 17  ALKPHOS 76  BILITOT 0.4  PROT 7.7  ALBUMIN  2.1*    No results for input(s): LIPASE, AMYLASE in the last  168 hours. Recent Labs  Lab 12/10/23 2234  AMMONIA 54*    CBC: Recent Labs  Lab 12/08/23 0529 12/09/23 0553 12/10/23 0334 12/10/23 2113 12/11/23 0421 12/12/23 0505  WBC 14.6* 14.8* 15.0*  --  13.1* 13.1*  NEUTROABS  --   --   --   --   --  10.1*  HGB 7.2* 7.4* 7.0* 7.9* 8.2* 8.0*  HCT 24.0* 23.4* 22.9* 25.5* 26.4* 25.2*  MCV 92.3 90.0 91.6  --  90.7 92.0  PLT 408* 410* 374  --  380 358    Cardiac Enzymes: No results for input(s): CKTOTAL, CKMB, CKMBINDEX, TROPONINI in the last 168 hours.  BNP: Invalid input(s): POCBNP  CBG: Recent Labs  Lab 12/11/23 1131 12/11/23 1149 12/11/23 1825 12/12/23 0731 12/12/23 0820  GLUCAP 101* 113* 105* 113* 116*    Microbiology: Results for orders placed or performed during the hospital encounter of 10/05/23  Blood Culture (routine x 2)     Status: None   Collection Time: 10/05/23  5:06 AM   Specimen: BLOOD  Result Value Ref Range Status   Specimen Description BLOOD LA  Final   Special Requests   Final    BOTTLES DRAWN AEROBIC AND ANAEROBIC Blood Culture results may not be optimal due to an inadequate volume of blood received in culture bottles   Culture   Final    NO GROWTH 5 DAYS Performed at [facility], [address], [place], [place]  [address]    Report Status 10/10/2023 FINAL  Final  Blood Culture (routine x 2)     Status: None   Collection Time: 10/05/23  5:07 AM   Specimen: BLOOD  Result Value Ref Range Status   Specimen Description BLOOD RA  Final   Special Requests   Final    BOTTLES DRAWN AEROBIC AND ANAEROBIC Blood Culture results may not be optimal due to an inadequate volume of blood received in culture bottles   Culture   Final    NO GROWTH 5 DAYS Performed at [facility], [address] [place], [place] [address]    Report Status 10/10/2023 FINAL  Final  Resp panel by RT-PCR (RSV, Flu A&B, Covid) Anterior Nasal Swab     Status: None   Collection Time: 10/05/23  5:56 AM    Specimen: Anterior Nasal Swab  Result Value Ref Range Status   SARS Coronavirus 2 by RT PCR NEGATIVE NEGATIVE Final    Comment: (NOTE) SARS-CoV-2 target nucleic acids are NOT DETECTED.  The SARS-CoV-2 RNA is generally detectable in upper respiratory specimens during the acute phase of infection. The lowest concentration of SARS-CoV-2 viral copies this assay can detect is 138 copies/mL. A negative result does not preclude SARS-Cov-2 infection and should not be used as the sole basis for treatment or other patient management decisions. A negative result may occur with  improper specimen collection/handling, submission of specimen other than nasopharyngeal swab, presence of viral mutation(s) within the areas targeted by this assay, and inadequate number of viral copies(<138 copies/mL). A negative result must be combined with clinical observations, patient history, and epidemiological information. The expected result is Negative.  Fact Sheet for Patients:  BloggerCourse.com  Fact Sheet for Healthcare Providers:  SeriousBroker.it  This test is no t yet approved or cleared by the [place]  FDA and  has been authorized for detection and/or diagnosis of SARS-CoV-2 by FDA under an Emergency Use Authorization (EUA). This EUA will remain  in effect (meaning this test can be used) for the duration of the COVID-19 declaration under Section 564(b)(1) of the Act, 21 U.S.C.section 360bbb-3(b)(1), unless the authorization is terminated  or revoked sooner.       Influenza A by PCR NEGATIVE NEGATIVE Final   Influenza B by PCR NEGATIVE NEGATIVE Final    Comment: (NOTE) The Xpert Xpress SARS-CoV-2/FLU/RSV plus assay is intended as an aid in the diagnosis of influenza from Nasopharyngeal swab specimens and should not be used as a sole basis for treatment. Nasal washings and aspirates are unacceptable for Xpert Xpress  SARS-CoV-2/FLU/RSV testing.  Fact Sheet for Patients: BloggerCourse.com  Fact Sheet for Healthcare Providers: SeriousBroker.it  This test is not yet approved or cleared by the [place]  FDA and has been authorized for detection and/or diagnosis of SARS-CoV-2 by FDA under an Emergency Use Authorization (EUA). This EUA will remain in effect (meaning this test can be used) for the duration of the COVID-19 declaration under Section 564(b)(1) of the Act, 21 U.S.C. section 360bbb-3(b)(1), unless the authorization is terminated or revoked.     Resp Syncytial Virus by PCR NEGATIVE NEGATIVE Final    Comment: (NOTE) Fact Sheet for Patients: BloggerCourse.com  Fact Sheet for Healthcare Providers: SeriousBroker.it  This test is not yet approved or cleared by the [place]  FDA and has been authorized for detection and/or diagnosis of SARS-CoV-2 by FDA under an Emergency Use Authorization (EUA). This EUA will remain in effect (meaning this test can be used) for the duration  of the COVID-19 declaration under Section 564(b)(1) of the Act, 21 U.S.C. section 360bbb-3(b)(1), unless the authorization is terminated or revoked.  Performed at [facility], [address] [place], [place] [address]   Respiratory (~20 pathogens) panel by PCR     Status: None   Collection Time: 10/05/23  8:11 AM   Specimen: Nasopharyngeal Swab; Respiratory  Result Value Ref Range Status   Adenovirus NOT DETECTED NOT DETECTED Final   Coronavirus 229E NOT DETECTED NOT DETECTED Final    Comment: (NOTE) The Coronavirus on the Respiratory Panel, DOES NOT test for the novel  Coronavirus (2019 nCoV)    Coronavirus HKU1 NOT DETECTED NOT DETECTED Final   Coronavirus NL63 NOT DETECTED NOT DETECTED Final   Coronavirus OC43 NOT DETECTED NOT DETECTED Final   Metapneumovirus NOT DETECTED NOT DETECTED Final    Rhinovirus / Enterovirus NOT DETECTED NOT DETECTED Final   Influenza A NOT DETECTED NOT DETECTED Final   Influenza B NOT DETECTED NOT DETECTED Final   Parainfluenza Virus 1 NOT DETECTED NOT DETECTED Final   Parainfluenza Virus 2 NOT DETECTED NOT DETECTED Final   Parainfluenza Virus 3 NOT DETECTED NOT DETECTED Final   Parainfluenza Virus 4 NOT DETECTED NOT DETECTED Final   Respiratory Syncytial Virus NOT DETECTED NOT DETECTED Final   Bordetella pertussis NOT DETECTED NOT DETECTED Final   Bordetella Parapertussis NOT DETECTED NOT DETECTED Final   Chlamydophila pneumoniae NOT DETECTED NOT DETECTED Final   Mycoplasma pneumoniae NOT DETECTED NOT DETECTED Final    Comment: Performed at [facility], [address], [place], [place] [address]  Expectorated Sputum Assessment w Gram Stain, Rflx to Resp Cult     Status: None   Collection Time: 10/05/23  9:07 AM   Specimen: Sputum  Result Value Ref Range Status   Specimen Description SPUTUM  Final   Special Requests NONE  Final   Sputum evaluation   Final    Sputum specimen not acceptable for testing.  Please recollect.   C/KERRY NELSON AT 1005 10/05/23.PMF Performed at [facility], [address] [place], [place] [address]    Report Status 10/05/2023 FINAL  Final  Expectorated Sputum Assessment w Gram Stain, Rflx to Resp Cult     Status: None   Collection Time: 10/05/23 10:50 AM  Result Value Ref Range Status   Specimen Description EXPECTORATED SPUTUM  Final   Special Requests NONE  Final   Sputum evaluation   Final    THIS SPECIMEN IS ACCEPTABLE FOR SPUTUM CULTURE Performed at [facility], [address], [place], [place] [address]    Report Status 10/05/2023 FINAL  Final  Culture, Respiratory w Gram Stain     Status: None   Collection Time: 10/05/23 10:50 AM  Result Value Ref Range Status   Specimen Description   Final    EXPECTORATED SPUTUM Performed at [facility], [address],  [place], [place] [address]    Special Requests   Final    NONE Reflexed from [id number] Performed at [facility], [address] [place], [place] [address]    Gram Stain   Final    RARE WBC SEEN RARE VONNE POSITIVE RODS RARE VONNE POSITIVE COCCI RARE GRAM NEGATIVE RODS    Culture   Final    FEW Normal respiratory flora-no Staph aureus or Pseudomonas seen Performed at [facility], [address], [place], [place] [address]    Report Status 10/07/2023 FINAL  Final  MRSA Next  Gen by PCR, Nasal     Status: None   Collection Time: 10/06/23 12:57 AM   Specimen: Nasal Mucosa; Nasal Swab  Result Value Ref Range Status   MRSA by PCR Next Gen NOT DETECTED NOT DETECTED Final    Comment: (NOTE) The GeneXpert MRSA Assay (FDA approved for NASAL specimens only), is one component of a comprehensive MRSA colonization surveillance program. It is not intended to diagnose MRSA infection nor to guide or monitor treatment for MRSA infections. Test performance is not FDA approved in patients less than 58 years old. Performed at [facility], [address] [place], [place] [address]   Culture, Respiratory w Gram Stain     Status: None   Collection Time: 10/06/23 11:37 AM   Specimen: INDUCED SPUTUM  Result Value Ref Range Status   Specimen Description   Final    INDUCED SPUTUM Performed at [facility], [address], [place], [place] [address]    Special Requests   Final    NONE Performed at [facility], [address] [place], [place] [address]    Gram Stain   Final    FEW WBC PRESENT,BOTH PMN AND MONONUCLEAR FEW GRAM POSITIVE RODS    Culture   Final    MODERATE LACTOBACILLUS FERMENTUM Standardized susceptibility testing for this organism is not available. Performed at [facility], [address] [place], [place] [address]    Report Status 10/09/2023 FINAL  Final  Culture, blood (x 2)     Status: None   Collection Time: 10/22/23  1:00  AM   Specimen: BLOOD  Result Value Ref Range Status   Specimen Description BLOOD BLOOD RIGHT ARM  Final   Special Requests   Final    BOTTLES DRAWN AEROBIC AND ANAEROBIC Blood Culture adequate volume   Culture   Final    NO GROWTH 5 DAYS Performed at [facility], [address] [place], [place] [address]    Report Status 10/27/2023 FINAL  Final  Culture, blood (x 2)     Status: None   Collection Time: 10/22/23  1:00 AM   Specimen: BLOOD  Result Value Ref Range Status   Specimen Description BLOOD BLOOD RIGHT ARM  Final   Special Requests   Final    BOTTLES DRAWN AEROBIC AND ANAEROBIC Blood Culture adequate volume   Culture   Final    NO GROWTH 5 DAYS Performed at [facility], [address] [place], [place] [address]    Report Status 10/27/2023 FINAL  Final  Resp panel by RT-PCR (RSV, Flu A&B, Covid) Anterior Nasal Swab     Status: None   Collection Time: 11/09/23  5:31 PM   Specimen: Anterior Nasal Swab  Result Value Ref Range Status   SARS Coronavirus 2 by RT PCR NEGATIVE NEGATIVE Final    Comment: (NOTE) SARS-CoV-2 target nucleic acids are NOT DETECTED.  The SARS-CoV-2 RNA is generally detectable in upper respiratory specimens during the acute phase of infection. The lowest concentration of SARS-CoV-2 viral copies this assay can detect is 138 copies/mL. A negative result does not preclude SARS-Cov-2 infection and should not be used as the sole basis for treatment or other patient management decisions. A negative result may occur with  improper specimen collection/handling, submission of specimen other than nasopharyngeal swab, presence of viral mutation(s) within the areas targeted by this assay, and inadequate number of viral copies(<138 copies/mL). A negative result must be combined with clinical observations, patient history,  and epidemiological information. The expected result is Negative.  Fact Sheet for Patients:   BloggerCourse.com  Fact Sheet for Healthcare Providers:  SeriousBroker.it  This test is no t yet approved or cleared by the [place]  FDA and  has been authorized for detection and/or diagnosis of SARS-CoV-2 by FDA under an Emergency Use Authorization (EUA). This EUA will remain  in effect (meaning this test can be used) for the duration of the COVID-19 declaration under Section 564(b)(1) of the Act, 21 U.S.C.section 360bbb-3(b)(1), unless the authorization is terminated  or revoked sooner.       Influenza A by PCR NEGATIVE NEGATIVE Final   Influenza B by PCR NEGATIVE NEGATIVE Final    Comment: (NOTE) The Xpert Xpress SARS-CoV-2/FLU/RSV plus assay is intended as an aid in the diagnosis of influenza from Nasopharyngeal swab specimens and should not be used as a sole basis for treatment. Nasal washings and aspirates are unacceptable for Xpert Xpress SARS-CoV-2/FLU/RSV testing.  Fact Sheet for Patients: BloggerCourse.com  Fact Sheet for Healthcare Providers: SeriousBroker.it  This test is not yet approved or cleared by the [place]  FDA and has been authorized for detection and/or diagnosis of SARS-CoV-2 by FDA under an Emergency Use Authorization (EUA). This EUA will remain in effect (meaning this test can be used) for the duration of the COVID-19 declaration under Section 564(b)(1) of the Act, 21 U.S.C. section 360bbb-3(b)(1), unless the authorization is terminated or revoked.     Resp Syncytial Virus by PCR NEGATIVE NEGATIVE Final    Comment: (NOTE) Fact Sheet for Patients: BloggerCourse.com  Fact Sheet for Healthcare Providers: SeriousBroker.it  This test is not yet approved or cleared by the [place]  FDA and has been authorized for detection and/or diagnosis of SARS-CoV-2 by FDA under an Emergency Use  Authorization (EUA). This EUA will remain in effect (meaning this test can be used) for the duration of the COVID-19 declaration under Section 564(b)(1) of the Act, 21 U.S.C. section 360bbb-3(b)(1), unless the authorization is terminated or revoked.  Performed at [facility], [address] [place], [place] [address]   Respiratory (~20 pathogens) panel by PCR     Status: None   Collection Time: 11/09/23  5:31 PM   Specimen: Nasopharyngeal Swab; Respiratory  Result Value Ref Range Status   Adenovirus NOT DETECTED NOT DETECTED Final   Coronavirus 229E NOT DETECTED NOT DETECTED Final    Comment: (NOTE) The Coronavirus on the Respiratory Panel, DOES NOT test for the novel  Coronavirus (2019 nCoV)    Coronavirus HKU1 NOT DETECTED NOT DETECTED Final   Coronavirus NL63 NOT DETECTED NOT DETECTED Final   Coronavirus OC43 NOT DETECTED NOT DETECTED Final   Metapneumovirus NOT DETECTED NOT DETECTED Final   Rhinovirus / Enterovirus NOT DETECTED NOT DETECTED Final   Influenza A NOT DETECTED NOT DETECTED Final   Influenza B NOT DETECTED NOT DETECTED Final   Parainfluenza Virus 1 NOT DETECTED NOT DETECTED Final   Parainfluenza Virus 2 NOT DETECTED NOT DETECTED Final   Parainfluenza Virus 3 NOT DETECTED NOT DETECTED Final   Parainfluenza Virus 4 NOT DETECTED NOT DETECTED Final   Respiratory Syncytial Virus NOT DETECTED NOT DETECTED Final   Bordetella pertussis NOT DETECTED NOT DETECTED Final   Bordetella Parapertussis NOT DETECTED NOT DETECTED Final   Chlamydophila pneumoniae NOT DETECTED NOT DETECTED Final   Mycoplasma pneumoniae NOT DETECTED NOT DETECTED Final    Comment: Performed at [facility], [address] [place], [place] [address]  Culture, blood (Routine  X 2) w Reflex to ID Panel     Status: None   Collection Time: 11/09/23  6:16 PM   Specimen: BLOOD  Result Value Ref Range Status   Specimen Description   Final    BLOOD RIGHT ANTECUBITAL Performed at [facility], [address], [place], [place] [address]    Special Requests   Final    BOTTLES DRAWN AEROBIC ONLY Blood Culture adequate volume Performed at [facility], [address], [place], [place] [address]    Culture   Final    NO GROWTH 11 DAYS Performed at [facility], [address], [place], [place] [address]    Report Status 11/20/2023 FINAL  Final  Culture, blood (Routine X 2) w Reflex to ID Panel     Status: None   Collection Time: 11/09/23  6:23 PM   Specimen: BLOOD  Result Value Ref Range Status   Specimen Description   Final    BLOOD BLOOD LEFT FOREARM Performed at [facility], [address] [place], [place] [address]    Special Requests   Final    BOTTLES DRAWN AEROBIC AND ANAEROBIC Blood Culture adequate volume Performed at [facility], [address] [place], [place] [address]    Culture   Final    NO GROWTH 11 DAYS Performed at [facility], [address] [place], [place] [address]    Report Status 11/20/2023 FINAL  Final  Urine Culture     Status: Abnormal   Collection Time: 11/10/23  1:58 AM   Specimen: Urine, Random  Result Value Ref Range Status   Specimen Description   Final    URINE, RANDOM Performed at [facility], [address] [place], [place] [address]    Special Requests   Final    NONE Reflexed from [id number] Performed at [facility], [address] [place], [place] [address]    Culture 60,000 COLONIES/mL ENTEROCOCCUS FAECALIS (A)  Final   Report Status 11/12/2023 FINAL  Final   Organism ID, Bacteria ENTEROCOCCUS FAECALIS (A)  Final      Susceptibility   Enterococcus faecalis - MIC*    AMPICILLIN  <=2 SENSITIVE Sensitive     NITROFURANTOIN <=16 SENSITIVE Sensitive     VANCOMYCIN  1 SENSITIVE Sensitive     * 60,000 COLONIES/mL ENTEROCOCCUS FAECALIS  Culture, blood (x 2)     Status: None   Collection Time: 11/25/23 10:31 PM   Specimen: BLOOD  Result Value Ref  Range Status   Specimen Description BLOOD BLOOD LEFT ARM  Final   Special Requests   Final    BOTTLES DRAWN AEROBIC AND ANAEROBIC Blood Culture adequate volume   Culture   Final    NO GROWTH 5 DAYS Performed at [facility], [address], [place], [place] [address]    Report Status 11/30/2023 FINAL  Final  Culture, blood (x 2)     Status: None   Collection Time: 11/25/23 10:31 PM   Specimen: BLOOD  Result Value Ref Range Status   Specimen Description BLOOD BLOOD RIGHT ARM  Final   Special Requests   Final    BOTTLES DRAWN AEROBIC AND ANAEROBIC Blood Culture adequate volume   Culture   Final    NO GROWTH 5 DAYS Performed at [facility], [address] [place], [place] [address]    Report Status 11/30/2023 FINAL  Final  Gastrointestinal Panel by PCR , Stool     Status:  None   Collection Time: 11/30/23  5:10 PM   Specimen: Stool  Result Value Ref Range Status   Campylobacter species NOT DETECTED NOT DETECTED Final   Plesimonas shigelloides NOT DETECTED NOT DETECTED Final   Salmonella species NOT DETECTED NOT DETECTED Final   Yersinia enterocolitica NOT DETECTED NOT DETECTED Final   Vibrio species NOT DETECTED NOT DETECTED Final   Vibrio cholerae NOT DETECTED NOT DETECTED Final   Enteroaggregative E coli (EAEC) NOT DETECTED NOT DETECTED Final   Enteropathogenic E coli (EPEC) NOT DETECTED NOT DETECTED Final   Enterotoxigenic E coli (ETEC) NOT DETECTED NOT DETECTED Final   Shiga like toxin producing E coli (STEC) NOT DETECTED NOT DETECTED Final   Shigella/Enteroinvasive E coli (EIEC) NOT DETECTED NOT DETECTED Final   Cryptosporidium NOT DETECTED NOT DETECTED Final   Cyclospora cayetanensis NOT DETECTED NOT DETECTED Final   Entamoeba histolytica NOT DETECTED NOT DETECTED Final   Giardia lamblia NOT DETECTED NOT DETECTED Final   Adenovirus F40/41 NOT DETECTED NOT DETECTED Final   Astrovirus NOT DETECTED NOT DETECTED Final   Norovirus GI/GII NOT DETECTED NOT  DETECTED Final   Rotavirus A NOT DETECTED NOT DETECTED Final   Sapovirus (I, II, IV, and V) NOT DETECTED NOT DETECTED Final    Comment: Performed at [facility], [address] [place], [place] [address]    Coagulation Studies: No results for input(s): LABPROT, INR in the last 72 hours.   Urinalysis: No results for input(s): COLORURINE, LABSPEC, PHURINE, GLUCOSEU, HGBUR, BILIRUBINUR, KETONESUR, PROTEINUR, UROBILINOGEN, NITRITE, LEUKOCYTESUR in the last 72 hours.  Invalid input(s): APPERANCEUR     Imaging: No results found.        Medications:    albumin  human 25 g (12/09/23 1054)    sodium chloride    Intravenous Once   Chlorhexidine  Gluconate Cloth  6 each Topical Q0600   epoetin  alfa-epbx (RETACRIT ) injection  10,000 Units Intravenous Q T,Th,Sat-1800   feeding supplement (NEPRO CARB STEADY)  237 mL Per Tube 5 X Daily   feeding supplement (PROSource TF20)  60 mL Per Tube Daily   ferrous sulfate   325 mg Per Tube Daily   free water   120 mL Per Tube 5 X Daily   gentamicin  ointment   Topical TID   heparin  injection (subcutaneous)  5,000 Units Subcutaneous Q8H   insulin  aspart  10 Units Subcutaneous Once   midodrine   5 mg Per Tube TID WC   multivitamin  1 tablet Per Tube QHS   pantoprazole  (PROTONIX ) IV  40 mg Intravenous Q12H   saccharomyces boulardii  250 mg Per Tube BID   acetaminophen  **OR** acetaminophen , albumin  human, artificial tears, bisacodyl , ipratropium-albuterol , [DISCONTINUED] ondansetron  **OR** ondansetron  (ZOFRAN ) IV, mouth rinse, sennosides  Assessment/ Plan:  Mr. Robert Vega is a 83 y.o.  male with obstructive sleep apnea, GERD, obesity, hypertension, dysphagia, severe esophageal dysmotility with achalasia status post PEG tube placement, recent aspiration pneumonia acute respiratory failure status post extubation 10/09/2023, who was admitted to [facility] on 10/05/2023 for Aspiration into respiratory tract, initial  encounter [T17.908A] Severe sepsis (HCC) [A41.9, R65.20] History of dysphagia [Z87.898] Fever, unspecified fever cause [R50.9] Multifocal pneumonia [J18.9]   1.  End stage renal disease requiting hemodialysis. Due to lack of meaningful recovery, we feel patient is now considered ESRD.  Patient is critically ill.  He was started on hemodialysis for severe hyperkalemia and uremia. Vascular surgery placed PermCath on 6/26.  - Receiving dialysis treatment today, UF 1 to 1.5 L as tolerated. - Accepted  at [facility] on a TTS schedule, chair time 7:10am.  - TOC continues search for rehab.    2.  Sepsis and hypotension due to aspiration pneumonia, found to have distal esophageal obstruction with severe dysmotility.  He is status post botulinum toxin injection to the lower esophageal sphincter.  PEG tube placed this admission. Receiving continuous tube feeds with scheduled free water  flushes.  - Completed Zosyn  - Continue midodrine  -Blood pressure 126/63 during dialysis.   4.Anemia in the setting of renal failure Lab Results  Component Value Date   HGB 8.0 (L) 12/12/2023  - Continue IV EPO  with HD treatments.  -Hemoglobin 8.0, blood transfusion received on 7/15  5.  Bilateral hydronephrosis Noted on CT.Urology team has evaluated the patient and it is thought to be secondary to reflux.  Due to health, further intervention deferred at this time.     LOS: 66 Robert Vega 7/17/202511:25 AM

## 2023-12-12 NOTE — Progress Notes (Signed)
 Hemodialysis Note:  Received patient in bed to unit. Asleep. Informed consent singed and in chart.  Treatment initiated: 0845 Treatment completed: 1200  Access used: Right Subclavcian Access issues: None  Patient tolerated well. Transported back to room, alert without acute distress. Report given to patient's RN.  Total UF removed: 1 Liter Medications given: Retacrit  10000 units IV  Post HD weight: 94.1 kg  [facility]

## 2023-12-12 NOTE — TOC Progression Note (Signed)
 Transition of Care [facility]) - Progression Note    Patient Details  Name: Robert Vega MRN: [id number] Date of Birth: 1941-01-13  Transition of [facility]) CM/SW Contact  [name], RN Phone Number: 12/12/2023, 4:24 PM  Clinical Narrative:     Email sent below to [name] Granddaughter and son [name] Dr Barbarann has indicated that she is ready for me to start authorization for rehab  Again, at this time the only confirmed bed offer we have is [facility] [place], as I did not hear back if you would like to proceed with switching dialysis centers to [place].   Please let me know how you would like to proceed as I will need to start Authorization in the morning    Expected Discharge Plan: Skilled Nursing Facility Barriers to Discharge: Continued Medical Work up  Expected Discharge Plan and Services                                               Social Determinants of Health (SDOH) Interventions SDOH Screenings   Food Insecurity: Patient Unable To Answer (10/06/2023)  Recent Concern: Food Insecurity - Food Insecurity Present (09/11/2023)   Received from [facility]  Housing: Patient Unable To Answer (10/06/2023)  Recent Concern: Housing - High Risk (09/11/2023)   Received from [facility]  Transportation Needs: Patient Unable To Answer (10/06/2023)  Utilities: Patient Unable To Answer (10/06/2023)  Depression (PHQ2-9): Low Risk  (05/07/2023)  Financial Resource Strain: Medium Risk (09/11/2023)   Received from [facility]  Social Connections: Unknown (10/06/2023)  Tobacco Use: Medium Risk (11/05/2023)    Readmission Risk Interventions     No data to display

## 2023-12-12 NOTE — Progress Notes (Addendum)
 Physical Therapy Treatment Patient Details Name: Robert Vega MRN: [id number] DOB: May 09, 1941 Today's Date: 12/12/2023   History of Present Illness 83 y.o male with significant PMH of OSA, GERD, Obesity, HTN, Dysphagia: EGD 03/2022 with food in upper esophagus complicated by aspiration event, cardiac arrest with round of CPR, and post resuscitation EGD with concern for lack of peristalsis. Pt presented to ED on 10/05/2023 with hypoxia, fever and generalized weakness; developed acute respiratory failure requiring intubation 5/10 due to aspiration pneumonia, extubated 5/15, but had significant agitation required brief course of Precedex ; pt now s/p exploratory laparotomy with closure of gastrotomy and insertion of gastrostomy tube on 10/22/23.  PT order discontinued by MD 11/10/23 and new PT consult received 11/14/23.  Pt s/p R temporary femoral vein dialysis catheter placement 11/11/23; now removed; pt now with HD perm cath.    PT Comments  Pt seen for updated POC/updated goals this date. No change to patient medical status. Reviewing past 2 weeks of therapy notes, pt has not made consistent progress, appears to wax/wane drastically regarding alertness level. Pt has been more alert the past two weeks however has not shown consistent participation regarding mobility levels. Pt still remains +3 total assist for bed mobility, occasionally can progress to max assist while seated at EOB. Does not appear to be engaged in therapy despite positioning. Occasionally perks up to familiar voices. At this time, does require hoyer lift for all mobility- to be done via Engineer, manufacturing. With decreased AROM- pt is at increased risk for contracture, pillows used for repositioning and off loading bony prominences.    It is recommended to continue therapy sessions on a trial basis, will perform 2 more sessions on preferred non-dialysis days to attempt to progress mobility and make progress towards goals. Discussed with care team. At  this time, pt is currently not at baseline level, recommending SNF for next level of care. If unable to consistently participate and make progress- will update recs accordingly. Secure chat sent to care team about recommendations. Will plan to update goals.   If plan is discharge home, recommend the following: Two people to help with walking and/or transfers;Two people to help with bathing/dressing/bathroom;Assistance with cooking/housework;Direct supervision/assist for medications management;Direct supervision/assist for financial management;Assist for transportation;Help with stairs or ramp for entrance;Supervision due to cognitive status   Can travel by private vehicle     No  Equipment Recommendations  None recommended by PT    Recommendations for Other Services       Precautions / Restrictions Precautions Precautions: Fall Recall of Precautions/Restrictions: Impaired Precaution/Restrictions Comments: PEG; perm cath (HD); rectal tube Restrictions Weight Bearing Restrictions Per Provider Order: No     Mobility  Bed Mobility Overal bed mobility: Needs Assistance Bed Mobility: Rolling Rolling: Total assist, +2 for physical assistance         General bed mobility comments: rolling due to rectal tube leaking. Takes +3 assistance for all positioning. Placed pillows to approximate joints, float heels and off load bony prominances. Left in chair position with use of bed functions.    Transfers                   General transfer comment: not performed this date due to safety    Ambulation/Gait               General Gait Details: not appropriate at this time   Optometrist  Tilt Bed    Modified Rankin (Stroke Patients Only)       Balance                                            Communication Communication Communication: Impaired Factors Affecting Communication: Difficulty expressing self   Cognition Arousal: Alert                             PT - Cognition Comments: pt with eyes open, at times, reports nosense speech. Does not track with eyes or respond to tactile/verbal cues Following commands: Impaired Following commands impaired:  (no command following noted)    Cueing    Exercises Other Exercises Other Exercises: attempted ROM with head with painful response. Attempted B UE/LE ther-ex with inability to participate or follow commands.    General Comments        Pertinent Vitals/Pain Pain Assessment Breathing: normal Negative Vocalization: occasional moan/groan, low speech, negative/disapproving quality Facial Expression: smiling or inexpressive Body Language: relaxed Consolability: no need to console PAINAD Score: 1    Home Living                          Prior Function            PT Goals (current goals can now be found in the care plan section) Acute Rehab PT Goals Patient Stated Goal: none stated PT Goal Formulation: Patient unable to participate in goal setting Time For Goal Achievement: 12/19/23 Potential to Achieve Goals: Poor Progress towards PT goals: Progressing toward goals    Frequency    Min 1X/week      PT Plan      Co-evaluation              AM-PAC PT 6 Clicks Mobility   Outcome Measure  Help needed turning from your back to your side while in a flat bed without using bedrails?: Total Help needed moving from lying on your back to sitting on the side of a flat bed without using bedrails?: Total Help needed moving to and from a bed to a chair (including a wheelchair)?: Total Help needed standing up from a chair using your arms (e.g., wheelchair or bedside chair)?: Total Help needed to walk in hospital room?: Total Help needed climbing 3-5 steps with a railing? : Total 6 Click Score: 6    End of Session   Activity Tolerance: Treatment limited secondary to medical complications  (Comment) Patient left: in bed;with bed alarm set Nurse Communication: Mobility status;Need for lift equipment PT Visit Diagnosis: Other abnormalities of gait and mobility (R26.89);Muscle weakness (generalized) (M62.81)     Time: 8660-8645 PT Time Calculation (min) (ACUTE ONLY): 15 min  Charges:    $Therapeutic Activity: 8-22 mins PT General Charges $$ ACUTE PT VISIT: 1 Visit                     Corean Dade, PT, DPT, GCS [phone]    Annjeanette Sarwar 12/12/2023, 3:49 PM

## 2023-12-12 NOTE — Progress Notes (Signed)
 Progress Note   Patient: Robert Vega FMW:[id number] DOB: December 11, 1940 DOA: 10/05/2023     68 DOS: the patient was seen and examined on 12/12/2023   Brief hospital course: 83 yo with h/o OSA, GERD, HTN, class 1 obesity, and dysphagia who presented on 5/10 with fever and hypoxia.  He was diagnosed with sepsis due to PNA with possible aspiration and effectively treated with antibiotics.  He had an aspiration event that led to cardiac arrest on 5/10 and was transferred to the ICU intubated after successful resuscitation.  He also had prior EGD in 03/2022 with food in upper esophagus complicated by aspiration event, cardiac arrest with round of CPR, and post resuscitation EGD with concern for lack of peristalsis. Hx prior EGD 08/2020 with note of abnormal cricopharyngeus, decrease in motility in esophagus, and spastic LES.  He was extubated on 5/14 but had severe delirium.  He underwent botox  injection into the LES on 5/20.  5/21 esophagram with diffusely distended esophagus with distal obstruction and severe dysmotility suggestive of achalasia.  Assessment and Plan:  Severe sepsis due to aspiration PNA with acute hypoxic respiratory failure - resolved  Initially w/ aspiration pna Later in hospital stay w/ intra-abdominal infection secondary to PEG tube not being in the right place Most recently with unexplained leukocytosis with negative blood cultures and urine culture with 60k colonies of Enterococcus He has completed antibiotics, most recently with Zosyn  Leukocytosis is improving  He is no longer being treated with antibiotics and is medically stable from this standpoint He has an overall poor prognosis, however, and is at ongoing risk of infection including aspiration   ESRD Initially with AKI on CKD Developed worsening renal failure with hyperkalemia, hyponatremia, and metabolic acidosis Started on HD with improvement (other than encephalopathy - see below) Consideration of ureteral stents  but as of 7/14 this is no longer the case; B nephrostomy tubes could be considered in the future if there was concern about persisting infection from a urological source - but this is not currently considered likely He will need lifelong HD unless family decides on an alternative trajectory (hospice)   Encephalopathy S/p cardiac arrest during this hospitalization Mental status appeared to be improving but recently has deteriorated Neurology is consulting and has ordered labs and EEG  NH4 is minimally elevated and so is unlikely to be the cause AND he appears unlikely to tolerate lactulose  given his loose BMs Possibly uremic encephalopathy, as his BUN has not effectively cleared despite HD He may have long-term improvement from this standpoint per neurology - but only if he has further medical and physical recovery and so this is uncertain   Ileus Tube feeds were held but have been restarted He has been stooling for several days and has rectal tube with significant output He is unlikely to tolerate lactulose  His granddaughter is hopeful that the diarrhea is associated with antibiotics and prior constipation He is on probiotics   Achlasia/dysphagia PEG tube was placed, getting tube feeds S/p Botox  in the LES on 5/20 Underwent EGD on 6/10 and GI has signed off High risk for aspiration should he be offered PO foods in the future Will need outpatient referral to tertiary advanced GI for consideration of POEMS procedure.  Recommend this only after a few weeks recovery period.    GERD (gastroesophageal reflux disease) Continue PPI    Essential hypertension, benign BP normal off medications   Generalized weakness/Disposition Therapy as tolerated Family aware he will have to sit up reliably before  he would be a good candidate for long term dialysis  He appears to be most appropriate for LTACH, possibly closer to family in [place]; this would allow them to be more involved in his care and to more  closely monitor his progress and make GOC decisions as needed; unfortunately, TOC reports that he does not have a qualifying revenue code for LTACH consideration (would require 3-day ICU stay, which he does not have) As such, SNF rehab vs. LTC is the next consideration He still currently has a SNF rehab recommendation and has a bed offer at [facility] - so if family is willing to agree we could pursue insurance authorization  Otherwise, his recommendation may soon be downgraded to LTC given his ongoing physical decline Family continues to believe that full scope of care is most appropriate He is medically ready for discharge at this time, as there are no further studies or consults planned unless there are medical changes   Class 1 obesity Body mass index is 30.62 kg/m.SABRA  Weight loss should be encouraged Outpatient PCP/bariatric medicine f/u encouraged Significantly low or high BMI is associated with higher medical risk including morbidity and mortality    Nutrition Status: Nutrition Problem: Moderate Malnutrition Etiology: acute illness Signs/Symptoms: mild fat depletion, mild muscle depletion, moderate muscle depletion, percent weight loss Interventions: Tube feeding   Goals of care  Previously, Robert Vega was to be the primary point of contact and Vega confirmed that the patient would want CPR and life support, if needed Subsequently, his granddaughter Robert Vega (a physical therapist in [place]) became the designated HCPOA Robert Vega is adamant that he would want full supportive and resuscitative measures and would not want hospice at this time Palliative care is following peripherally but is likely to sign off soon         Consultants: PCCM GI Nephrology Vascular surgery Surgery Urology (telephone only) Neurology Palliative care SLP OT PT [facility] team Dietician   Procedures: Bronchoscopy 5/11 PEG tube placement 5/13, 5/2, 5/27 EGD with botox  injection into LES 5/20 Ex  lap 5/27 EGD 6/10 TDC placement 6/16 HD 6/16- Perma catheter insertion 6/26   Antibiotics: Unasyn  6/16-20 Azithromycin  x 1 Cefepime  6/30-7/3 Ceftriaxone  5/10-14 Metronidazole  5/10, 6/30-7/3 Zosyn  6/11, 5/26-31, 7/3-15 Vancomycin  6/30-7/5    30 Day Unplanned Readmission Risk Score    Flowsheet Row ED to Hosp-Admission (Current) from 10/05/2023 in [facility]  30 Day Unplanned Readmission Risk Score (%) 24.84 Filed at 12/12/2023 0801    This score is the patient's risk of an unplanned readmission within 30 days of being discharged (0 -100%). The score is based on dignosis, age, lab data, medications, orders, and past utilization.   Low:  0-14.9   Medium: 15-21.9   High: 22-29.9   Extreme: 30 and above           Subjective: Awakens to voice but has mumbling mostly nonsensical speech in answer to questions.   Objective: Vitals:   12/12/23 1130 12/12/23 1200  BP: (!) 140/66 137/66  Pulse: 77 75  Resp: (!) 27 (!) 23  Temp:    SpO2: 100% 100%    Intake/Output Summary (Last 24 hours) at 12/12/2023 1308 Last data filed at 12/12/2023 1200 Gross per 24 hour  Intake 480 ml  Output 1000 ml  Net -520 ml   Filed Weights   12/02/23 1726 12/12/23 0838 12/12/23 1200  Weight: 94.3 kg 95.1 kg 94.1 kg    Exam:  General:  Appears chronically  ill, answers limited questions with slurred/groggy speech Eyes:   normal lids, iris ENT:  grossly normal hearing, lips & tongue, mmm Cardiovascular:  RRR. No LE edema.  Respiratory:   CTA bilaterally with no wheezes/rales/rhonchi.  Normal respiratory effort. Abdomen:  soft, NT, ND Skin:  no rash or induration seen on limited exam Musculoskeletal:  generalized weakness Psychiatric:  lethargic mood and affect, speech sparse Neurologic: unable to effectively perform  Data Reviewed: I have reviewed the patient's lab results since admission.  Pertinent labs for today include:   Na++ 133,  stable Glucose 114 BUN 92/Creatinine 7.28/GFR 7 WBC 13.1 Hgb 8     Family Communication: None present; multidisciplinary (TOC team, MD) telephone conversation was held with his granddaughter  Disposition: Status is: Inpatient Remains inpatient appropriate because: awaiting disposition     Time spent: 50 minutes  Unresulted Labs (From admission, onward)     Start     Ordered   12/12/23 0500  CBC with Differential/Platelet  Daily,   R     Question:  Specimen collection method  Answer:  Lab=Lab collect   12/11/23 1820   12/12/23 0500  Basic metabolic panel with GFR  Daily,   R     Question:  Specimen collection method  Answer:  Lab=Lab collect   12/11/23 1820   Signed and Held  Renal function panel  Once,   R       Question:  Specimen collection method  Answer:  Lab=Lab collect   Signed and Held   Signed and Held  Renal function panel  Once,   R       Question:  Specimen collection method  Answer:  Lab=Lab collect   Signed and Held   Signed and Held  Renal function panel  Tomorrow morning,   R       Question:  Specimen collection method  Answer:  Lab=Lab collect   Signed and Held   Signed and Held  Renal function panel  Tomorrow morning,   R       Question:  Specimen collection method  Answer:  Lab=Lab collect   Signed and Held   Signed and Held  CBC  Tomorrow morning,   R       Question:  Specimen collection method  Answer:  Lab=Lab collect   Signed and Held   Signed and Held  Renal function panel  Once,   R       Question:  Specimen collection method  Answer:  Lab=Lab collect   Signed and Held   Signed and Held  CBC  Once,   R       Question:  Specimen collection method  Answer:  Lab=Lab collect   Signed and Held             Author: Delon Herald, MD 12/12/2023 1:08 PM  For on call review [email or web address].

## 2023-12-12 NOTE — Progress Notes (Signed)
 Patient lethargic at beginning of shift, was able to have him open eyes with gentle sternal rub and voice.  I did not transfer to chair as patient was not able to remain alert. Fecal management system is place and is working well in preventing skin from breaking down. Patient has negative vocalization when being turned and is unable to participate at all.  G tube is leaking from port sites,I have attached an additional stopcock to slow down the leak.  There is no leaking around the G tube insertion site and skin is intact.

## 2023-12-12 NOTE — Progress Notes (Signed)
 Palliative Care Progress Note, Assessment & Plan   Patient Name: Robert Vega       Date: 12/12/2023 DOB: [birth year]  Age: 83 y.o. MRN#: [id number] Attending Physician: Barbarann Nest, MD Primary Care Physician: Jimmy Charlie FERNS, MD Admit Date: 10/05/2023  Subjective: Patient lying in bed in HD suite.  He does not acknowledge my presence.  He does not make any vocalizations during my visit.  Respirations are shallow, even, and moderately labeled with auditory secretions.  Patient is able to clear secretions but cannot make a strong cough.  No family or friends present during my visit.  HPI: 24 yo with h/o OSA, GERD, HTN, class 1 obesity, and dysphagia who presented on 5/10 with fever and hypoxia.  He was diagnosed with sepsis due to PNA with possible aspiration and effectively treated with antibiotics.  He had an aspiration event that led to cardiac arrest on 5/10 and was transferred to the ICU intubated after successful resuscitation.  He also had prior EGD in 03/2022 with food in upper esophagus complicated by aspiration event, cardiac arrest with round of CPR, and post resuscitation EGD with concern for lack of peristalsis. Hx prior EGD 08/2020 with note of abnormal cricopharyngeus, decrease in motility in esophagus, and spastic LES.  He was extubated on 5/14 but had severe delirium.  He underwent botox  injection into the LES on 5/20.  5/21 esophagram with diffusely distended esophagus with distal obstruction and severe dysmotility suggestive of achalasia.   Summary of counseling/coordination of care: Extensive chart review completed prior to meeting patient including labs, vital signs, imaging, progress notes, orders, and available advanced directive documents from current and previous encounters.   After  reviewing the patient's chart and assessing the patient at bedside, I spoke with patient's granddaughter/HCPOA over the phone.  Extensive discussion the patient's hospitalization, plan of care, treatment options, and boundaries of care reviewed.  Pretty sure she wants to continue with full code and full scope of treatment.  She is requesting a discussion with neurology after EEG results are known.  Additionally, she wants to treat ammonia level (54).  Explored what boundaries if any Robert Vega has been placed to know that care needs to be shifted from aggressive treatment to comfort measures.  Expressed concern that patient has not improved during hospitalization despite maximizing medical treatment.  Discussed she is minimally interactive, unable to engage in meaningful conversation, and has a extremely low functional status at this time.  She shares understanding but wishes to continue to push forward with aggressive treatment.  Therapeutic silence, active listening, and emotional support provided.  We discussed Brittany's request with attending Dr. Barbarann.  Dr. Barbarann plans to speak with Robert Vega as well this patient's son Robert Vega over the phone this afternoon with TOC to discuss disposition.  Neurology also to speak with Robert Vega over the phone.  No further acute palliative needs at this time.  PMT remains available patient to family throughout this hospitalization.  Physical Exam Vitals reviewed.  Constitutional:      Appearance: He is ill-appearing.  HENT:     Head: Normocephalic.     Mouth/Throat:     Mouth: Mucous membranes are dry.  Eyes:  Pupils: Pupils are equal, round, and reactive to light.  Pulmonary:     Breath sounds: Rhonchi present.  Abdominal:     Palpations: Abdomen is soft.  Neurological:     Mental Status: He is alert.     Comments: nonverbal             Total Time 50 minutes   Time spent includes: Detailed review of medical records (labs, imaging, vital signs),  medically appropriate exam (mental status, respiratory, cardiac, skin), discussed with treatment team, counseling and educating patient, family and staff, documenting clinical information, medication management and coordination of care.  Robert L. Arvid, DNP, FNP-BC Palliative Medicine Team

## 2023-12-12 NOTE — Progress Notes (Addendum)
 Date of Admission:  10/05/2023      ID: Robert Vega is a 83 y.o. male Principal Problem:   Severe sepsis (HCC) Active Problems:   Encounter for dialysis and dialysis catheter care (HCC)   GERD (gastroesophageal reflux disease)   Dysphagia   Essential hypertension, benign   Multifocal pneumonia   Acute respiratory failure with hypoxia and hypercapnia (HCC)   History of dysphagia   Aspiration pneumonia (HCC)   Achalasia of esophagus   Acute delirium   Obesity (BMI 30-39.9)   Generalized weakness   Gastrostomy complication (HCC)   AKI (acute kidney injury) (HCC)   Generalized abdominal pain   Malnutrition of moderate degree   Melena   Leukocytosis   Perinephric hematoma   Hydronephrosis   Encephalopathy   5/10 admission for weakness, hypoxia- aspiration pneumonia due to achlasia  5/10 CT b/l infitrates- intubated- extubated 5/14 Dilated esophagus 5/13 PEG instertion by GI 5/20 repeat EGD botulinum injection 5/25 pulled PEG out 5/27 exp lap for dislodged peg in the abdominal cavity 6/84melena 6/10 EGD 6/16 HD cath- dialysis started 6/26 permanent HD cath  Antibiotic therapy Ceftriaxone  5/10-5/14 Azithro 5/10-5/11 Metronidazole  5/10 Zosyn  5/26-5/31 Cefepime  6/30-7/3 Zosyn  7/3 >>7/15    Subjective: NA No change in his clinical status  Medications:   sodium chloride    Intravenous Once   Chlorhexidine  Gluconate Cloth  6 each Topical Q0600   epoetin  alfa-epbx (RETACRIT ) injection  10,000 Units Intravenous Q T,Th,Sat-1800   feeding supplement (NEPRO CARB STEADY)  237 mL Per Tube 5 X Daily   feeding supplement (PROSource TF20)  60 mL Per Tube Daily   ferrous sulfate   325 mg Per Tube Daily   free water   120 mL Per Tube 5 X Daily   gentamicin  ointment   Topical TID   heparin  injection (subcutaneous)  5,000 Units Subcutaneous Q8H   insulin  aspart  10 Units Subcutaneous Once   midodrine   5 mg Per Tube TID WC   multivitamin  1 tablet Per Tube QHS    pantoprazole  (PROTONIX ) IV  40 mg Intravenous Q12H   saccharomyces boulardii  250 mg Per Tube BID    Objective: Vital signs in last 24 hours: Patient Vitals for the past 24 hrs:  BP Temp Temp src Pulse Resp SpO2 Weight  12/12/23 1740 (!) 151/67 98.2 F (36.8 C) Axillary 89 (!) 22 97 % --  12/12/23 1405 138/87 98.1 F (36.7 C) Axillary 88 20 98 % --  12/12/23 1305 (!) 149/68 98.1 F (36.7 C) Axillary 88 (!) 24 97 % --  12/12/23 1200 137/66 -- -- 75 (!) 23 100 % 94.1 kg  12/12/23 1130 (!) 140/66 -- -- 77 (!) 27 100 % --  12/12/23 1100 134/63 -- -- 80 20 100 % --  12/12/23 1030 (!) 124/55 -- -- 79 (!) 23 98 % --  12/12/23 1000 130/64 -- -- 70 (!) 28 98 % --  12/12/23 0930 126/64 -- -- 75 (!) 25 97 % --  12/12/23 0900 138/67 -- -- 75 (!) 23 99 % --  12/12/23 0845 (!) 126/59 -- -- 69 (!) 26 99 % --  12/12/23 0838 (!) 133/57 98 F (36.7 C) Axillary 74 (!) 25 -- 95.1 kg  12/12/23 0730 132/61 97.7 F (36.5 C) Oral 71 (!) 24 100 % --  12/12/23 0207 131/65 97.9 F (36.6 C) Axillary 73 (!) 24 96 % --  12/11/23 2104 131/67 98.3 F (36.8 C) Axillary 72 20 97 % --  LDA HD cath PHYSICAL EXAM:  General: lethargic, somnolent, no verbal Lungs:b/l air entry-  Heart: s1s2 Abdomen: Soft, peg Rectal tube CNS unable to assess Lab Results    Latest Ref Rng & Units 12/12/2023    5:05 AM 12/11/2023    4:21 AM 12/10/2023    9:13 PM  CBC  WBC 4.0 - 10.5 K/uL 13.1  13.1    Hemoglobin 13.0 - 17.0 g/dL 8.0  8.2  7.9   Hematocrit 39.0 - 52.0 % 25.2  26.4  25.5   Platelets 150 - 400 K/uL 358  380         Latest Ref Rng & Units 12/12/2023    5:05 AM 12/11/2023    4:21 AM 12/10/2023    3:34 AM  CMP  Glucose 70 - 99 mg/dL 885  890  896   BUN 8 - 23 mg/dL 92  78  49   Creatinine 0.61 - 1.24 mg/dL 2.71  4.11  5.53   Sodium 135 - 145 mmol/L 133  133  134   Potassium 3.5 - 5.1 mmol/L 4.1  4.0  3.8   Chloride 98 - 111 mmol/L 95  95  95   CO2 22 - 32 mmol/L 24  24  28    Calcium  8.9 - 10.3 mg/dL  8.3  8.1  8.1   Total Protein 6.5 - 8.1 g/dL   7.7   Total Bilirubin 0.0 - 1.2 mg/dL   0.4   Alkaline Phos 38 - 126 U/L   76   AST 15 - 41 U/L   24   ALT 0 - 44 U/L   17     Wbc 25.4>14.8  Microbiology: 5/10 BC NG 5/27 BC NG 6/14 BC- NG 6/30 BC NG Studies/Results:  B/l infitrates   Assessment/Plan:  Prolonged and Complicated hospital stay.  Encephalopathy- seen by neuro - ( TSH, cortisol, N, B12 N)  Aspiration pneumonia- resolved Hypoxia - intubated and then extubated Achalasia, dilated esophagus para esophageal hernia  PEG  Dislodgement and peritonitis- exp lap on 5/27 Peg in place  Leucocytosis- multifactorial- reactive to hydronephrosis, perinephric stranding - possible infection,  perinephric hematoma, aspiration pneumonia,  Been on antibiotics sine 6/30 - zosyn  started on 7/3   improvement in leucocytosis- 25.4>13.1 - Dc on 7/15--  WBC stable at 13 Nothing to be done from ID perspective    B/l hydroureteronephrosis- unclear etiology- bladder wall thickening. Did he have BOO? No calculus CT scan repeated showed proteinaceous hemorrhagic material in kidneys- Will he need perc nephrostomy?  Left perinephric hematoma ? etiology Urology following- if wbc worsens would need repeat imaging and stents VS PCN  AKI since 5/26 now on dialysis  Will sign off tomorrow if wbc remains stable

## 2023-12-12 NOTE — TOC Progression Note (Signed)
 Transition of Care [facility]) - Progression Note    Patient Details  Name: Robert Vega MRN: [id number] Date of Birth: 1941-04-17  [facility]) CM/SW Contact  [name], RN Phone Number: 12/12/2023, 4:24 PM  Clinical Narrative:     No response from family regarding email.  Call place to [place].  TOC supervisor Delphine and Dr Barbarann Present. MD confirms that patient require no additional medical treatment and is medically appropriate for discharge.  [name] now request to proceed to placement at [facility] and transition to HD center in [place].   I explained to her that since patient has medically been cleared for discharge, and we have an accepting facility [facility], who can accommodate patient's current outpatient HD schedule TOC is unable to continue to pursue other placement options, however if patient discharge to [facility] they could request to purse alternative SNF outpatient. Delphine SINNING supervisor reiterated the above information.    [name] request that case be reviewed with leadership to determine if patient could remain inpatient while we purse a new out patient HD center and placement at [facility].  Case was presented to leadership 230 pm meeting.  They will continue to review, and notify TOC on how to proceed.  Message sent to [facility] to request review again to determine if patient would meet criteria - per [facility] with [facility] patient does not meet LTACH criteria - Damien with [facility] to review  Update:  Per Damien with [facility] does not meet medical criteria criteria for [facility]  Expected Discharge Plan: Skilled Nursing Facility Barriers to Discharge: Continued Medical Work up  Expected Discharge Plan and Services                                               Social Determinants of Health (SDOH) Interventions SDOH Screenings   Food Insecurity: Patient Unable To Answer (10/06/2023)   Recent Concern: Food Insecurity - Food Insecurity Present (09/11/2023)   Received from [facility]  Housing: Patient Unable To Answer (10/06/2023)  Recent Concern: Housing - High Risk (09/11/2023)   Received from [facility]  Transportation Needs: Patient Unable To Answer (10/06/2023)  Utilities: Patient Unable To Answer (10/06/2023)  Depression (PHQ2-9): Low Risk  (05/07/2023)  Financial Resource Strain: Medium Risk (09/11/2023)   Received from [facility]  Social Connections: Unknown (10/06/2023)  Tobacco Use: Medium Risk (11/05/2023)    Readmission Risk Interventions     No data to display

## 2023-12-13 DIAGNOSIS — J189 Pneumonia, unspecified organism: Secondary | ICD-10-CM | POA: Diagnosis not present

## 2023-12-13 DIAGNOSIS — J690 Pneumonitis due to inhalation of food and vomit: Secondary | ICD-10-CM | POA: Diagnosis not present

## 2023-12-13 DIAGNOSIS — Z87898 Personal history of other specified conditions: Secondary | ICD-10-CM | POA: Diagnosis not present

## 2023-12-13 DIAGNOSIS — A419 Sepsis, unspecified organism: Secondary | ICD-10-CM | POA: Diagnosis not present

## 2023-12-13 DIAGNOSIS — T17908A Unspecified foreign body in respiratory tract, part unspecified causing other injury, initial encounter: Secondary | ICD-10-CM | POA: Diagnosis not present

## 2023-12-13 DIAGNOSIS — G9340 Encephalopathy, unspecified: Secondary | ICD-10-CM | POA: Diagnosis not present

## 2023-12-13 DIAGNOSIS — K220 Achalasia of cardia: Secondary | ICD-10-CM | POA: Diagnosis not present

## 2023-12-13 DIAGNOSIS — R0902 Hypoxemia: Secondary | ICD-10-CM | POA: Diagnosis not present

## 2023-12-13 LAB — CBC WITH DIFFERENTIAL/PLATELET
Abs Immature Granulocytes: 0.2 K/uL — ABNORMAL HIGH (ref 0.00–0.07)
Basophils Absolute: 0.1 K/uL (ref 0.0–0.1)
Basophils Relative: 0 %
Eosinophils Absolute: 0 K/uL (ref 0.0–0.5)
Eosinophils Relative: 0 %
HCT: 25.3 % — ABNORMAL LOW (ref 39.0–52.0)
Hemoglobin: 8.1 g/dL — ABNORMAL LOW (ref 13.0–17.0)
Immature Granulocytes: 1 %
Lymphocytes Relative: 5 %
Lymphs Abs: 0.8 K/uL (ref 0.7–4.0)
MCH: 29.2 pg (ref 26.0–34.0)
MCHC: 32 g/dL (ref 30.0–36.0)
MCV: 91.3 fL (ref 80.0–100.0)
Monocytes Absolute: 2.4 K/uL — ABNORMAL HIGH (ref 0.1–1.0)
Monocytes Relative: 17 %
Neutro Abs: 11 K/uL — ABNORMAL HIGH (ref 1.7–7.7)
Neutrophils Relative %: 77 %
Platelets: 347 K/uL (ref 150–400)
RBC: 2.77 MIL/uL — ABNORMAL LOW (ref 4.22–5.81)
RDW: 16 % — ABNORMAL HIGH (ref 11.5–15.5)
WBC: 14.5 K/uL — ABNORMAL HIGH (ref 4.0–10.5)
nRBC: 0 % (ref 0.0–0.2)

## 2023-12-13 LAB — BASIC METABOLIC PANEL WITH GFR
Anion gap: 11 (ref 5–15)
BUN: 51 mg/dL — ABNORMAL HIGH (ref 8–23)
CO2: 27 mmol/L (ref 22–32)
Calcium: 8.1 mg/dL — ABNORMAL LOW (ref 8.9–10.3)
Chloride: 96 mmol/L — ABNORMAL LOW (ref 98–111)
Creatinine, Ser: 4.65 mg/dL — ABNORMAL HIGH (ref 0.61–1.24)
GFR, Estimated: 12 mL/min — ABNORMAL LOW (ref >60)
Glucose, Bld: 112 mg/dL — ABNORMAL HIGH (ref 70–99)
Potassium: 4 mmol/L (ref 3.5–5.1)
Sodium: 134 mmol/L — ABNORMAL LOW (ref 135–145)

## 2023-12-13 LAB — GLUCOSE, CAPILLARY
Glucose-Capillary: 142 mg/dL — ABNORMAL HIGH (ref 70–99)
Glucose-Capillary: 138 mg/dL — ABNORMAL HIGH (ref 70–99)
Glucose-Capillary: 115 mg/dL — ABNORMAL HIGH (ref 70–99)

## 2023-12-13 NOTE — Progress Notes (Signed)
 Progress Note   Patient: Robert Vega FMW:[id number] DOB: [birth year] DOA: 10/05/2023     69 DOS: the patient was seen and examined on 12/13/2023   Brief hospital course: 83 yo with h/o OSA, GERD, HTN, class 1 obesity, and dysphagia who presented on 5/10 with fever and hypoxia.  He was diagnosed with sepsis due to PNA with possible aspiration and effectively treated with antibiotics.  He had an aspiration event that led to respiratory arrest on 5/10 (no CPR provided during this hospitalization on extensive chart review) and was transferred to the ICU intubated.  He also had prior EGD in 03/2022 with food in upper esophagus complicated by aspiration event, cardiac arrest with round of CPR, and post resuscitation EGD with concern for lack of peristalsis. Hx prior EGD 08/2020 with note of abnormal cricopharyngeus, decrease in motility in esophagus, and spastic LES.  He was extubated on 5/14 but had severe delirium.  He underwent botox  injection into the LES on 5/20.  5/21 esophagram with diffusely distended esophagus with distal obstruction and severe dysmotility suggestive of achalasia. He underwent PEG tube placement on 5/13, pulled out the tube on 5/25 and eventually required ex lap placement.  After that situation, he developed progressive AKI (baseline creatinine 1.6), culminating in initiation of HD.  He has had waxing and waning mental status during this prolonged hospitalization; neurology has consulted and attributes this to metabolic encephalopathy related to his multitude of medical problems.  ID consulted and signed off; he has completed antibiotics.  Urology consulted for consideration of B nephrostomy tubes but this was deferred.  He is now medically stable and awaiting placement in LTC SNF, as he does not appear to be physically capable of progressing appropriately in STR.  Assessment and Plan:  Severe sepsis due to aspiration PNA with acute hypoxic respiratory failure - resolved  Initially  w/ aspiration pna Later in hospital stay w/ intra-abdominal infection secondary to PEG tube not being in the right place Most recently with unexplained leukocytosis with negative blood cultures and urine culture with 60k colonies of Enterococcus He has completed antibiotics, most recently with Zosyn  Leukocytosis is improving  He is no longer being treated with antibiotics and is medically stable from this standpoint; ID has signed off He has an overall poor prognosis, however, and is at ongoing risk of infection including aspiration   ESRD Initially with AKI on CKD Developed worsening renal failure with hyperkalemia, hyponatremia, and metabolic acidosis Started on HD with improvement (other than encephalopathy - see below) Consideration of ureteral stents but as of 7/14 this is no longer the case; B nephrostomy tubes could be considered in the future if there was concern about persisting infection from a urological source - but this is not currently considered likely He will need lifelong HD unless family decides on an alternative trajectory (hospice) Nephrology is planning to extend his HD time to try to help clear the BUN; nutrition reports that changing from high protein feeds did not improve BUN but did progress his muscle wasting and so he was changed back   Encephalopathy S/p cardiac arrest during prior hospitalization (extensive chart review shows no evidence of cardiac arrest - only respiratory - during this hospitalization Mental status appeared to be improving but recently has deteriorated Neurology consulted and has signed off  NH4 is minimally elevated and so is unlikely to be the cause AND he appears unlikely to tolerate lactulose  given his loose BMs; additionally, the NH4 would be expected to be cleared  with HD Possibly uremic encephalopathy, as his BUN has not effectively cleared despite HD He may have long-term improvement from this standpoint per neurology - but only if he has  further medical and physical recovery and so this is uncertain Neurology has signed off   Ileus Tube feeds were held but have been restarted He has been stooling for several days and has rectal tube with significant output He is unlikely to tolerate lactulose  His granddaughter is hopeful that the diarrhea is associated with antibiotics and prior constipation He is on probiotics GI, surgery have signed off   Achlasia/dysphagia PEG tube was placed, getting tube feeds S/p Botox  in the LES on 5/20 Underwent EGD on 6/10 and GI has signed off High risk for aspiration should he be offered PO foods in the future Will need outpatient referral to tertiary advanced GI for consideration of POEMS procedure.  Recommend this only after a few weeks recovery period.    GERD (gastroesophageal reflux disease) Continue PPI    Essential hypertension, benign BP normal off medications   Generalized weakness/Disposition Therapy as tolerated He has been declined by [facility] and does not appear to be a current candidate, unfortunately He also does not appear to be an appropriate STR candidate; he is minimally able to participate in therapy and given the prolonged nature of his hospitalization with recurrent setbacks, he is unlikely to demonstrate meaningful recovery in STR As such, LTC is my recommendation for him at this time PT is evaluating today, as it is a non-HD day, and is expected to provide a similar recommendation Family continues to believe that full scope of care is most appropriate He is medically ready for discharge at this time, as there are no further studies or consults planned unless there are medical changes   Class 1 obesity Body mass index is 30.62 kg/m.SABRA  Weight loss should be encouraged Outpatient PCP/bariatric medicine f/u encouraged Significantly low or high BMI is associated with higher medical risk including morbidity and mortality    Nutrition Status: Nutrition Problem:  Moderate Malnutrition Etiology: acute illness Signs/Symptoms: mild fat depletion, mild muscle depletion, moderate muscle depletion, percent weight loss Interventions: Tube feeding   Goals of care  Previously, Son [name] was to be the primary point of contact and [name] confirmed that the patient would want CPR and life support, if needed Subsequently, his granddaughter [name] (a physical therapist in [place]) became the designated HCPOA [name] is adamant that he would want full supportive and resuscitative measures and would not want hospice at this time Palliative care has signed off         Consultants: PCCM GI Nephrology Vascular surgery Surgery Urology (telephone only) Neurology Palliative care SLP OT PT [facility] team Dietician   Procedures: Bronchoscopy 5/11 PEG tube placement 5/13, 5/2, 5/27 EGD with botox  injection into LES 5/20 Ex lap 5/27 EGD 6/10 TDC placement 6/16 HD 6/16- Perma catheter insertion 6/26   Antibiotics: Unasyn  6/16-20 Azithromycin  x 1 Cefepime  6/30-7/3 Ceftriaxone  5/10-14 Metronidazole  5/10, 6/30-7/3 Zosyn  6/11, 5/26-31, 7/3-15 Vancomycin  6/30-7/5    30 Day Unplanned Readmission Risk Score    Flowsheet Row ED to Hosp-Admission (Current) from 10/05/2023 in [facility]  30 Day Unplanned Readmission Risk Score (%) 24.84 Filed at 12/13/2023 1200    This score is the patient's risk of an unplanned readmission within 30 days of being discharged (0 -100%). The score is based on dignosis, age, lab data, medications, orders, and past utilization.   Low:  0-14.9   Medium: 15-21.9   High: 22-29.9   Extreme: 30 and above           Subjective: Minimally responsive/interactive.  Opens eyes to voice/touch, minimal unintelligible speech.   Objective: Vitals:   12/13/23 0442 12/13/23 0830  BP: 122/62 (!) 122/59  Pulse: 87 85  Resp:  20  Temp: 98.6 F (37 C) 98.8 F (37.1 C)  SpO2: 97% 96%    Intake/Output  Summary (Last 24 hours) at 12/13/2023 1259 Last data filed at 12/13/2023 1124 Gross per 24 hour  Intake 720 ml  Output --  Net 720 ml   Filed Weights   12/02/23 1726 12/12/23 0838 12/12/23 1200  Weight: 94.3 kg 95.1 kg 94.1 kg    Exam:  General:  Appears chronically ill, answers limited questions with slurred/groggy speech Eyes:   normal lids, iris ENT:  grossly normal hearing, lips & tongue, mmm Cardiovascular:  RRR. No LE edema.  Respiratory:   CTA bilaterally with no wheezes/rales/rhonchi.  Normal respiratory effort. Abdomen:  soft, NT, ND Skin:  no rash or induration seen on limited exam Musculoskeletal:  generalized weakness Psychiatric:  lethargic mood and affect, speech sparse Neurologic: unable to effectively perform  Data Reviewed: I have reviewed the patient's lab results since admission.  Pertinent labs for today include:   Na++ 134, improving Glucose 112 BUN 51/Creatinine 4.65/GFR 12 WBC 14.5 Hgb 8.1, stable     Family Communication: None present  Disposition: Status is: Inpatient Remains inpatient appropriate because: awaiting disposition     Time spent: 50 minutes  Unresulted Labs (From admission, onward)    None        Author: Delon Herald, MD 12/13/2023 12:59 PM  For on call review [email or web address].

## 2023-12-13 NOTE — Progress Notes (Signed)
 Was contacted by telephone by pt's grand daughter, [name] Galentine([phone]). She stated she had concerns about the quality of care her grandfather was getting. She stated she felt the male MDs were dismissive of her and frustrated with her. She stated she felt like he was denied care, whenever the hospitalist would not agree to involve a specialist. She stated it was very difficult to get specialties involved but when they were, they seemed to be directing the care. She felt dismissed by providers, and they had led to the decline in his health. She talked positively about Tam Summers RN unit director, said she made the care much better. She spoke highly of Dr. Charlie Patterson. I notified Dr. Maree of concerns. Gave her the [facility] number if she had other concerns. Made Saturnino Gin aware of discussion I had with Granddaughter. Spoke with her on the phone for about 30 minutes.

## 2023-12-13 NOTE — Progress Notes (Signed)
 Mobility Specialist - Progress Note   12/13/23 1550  Mobility  Activity Transferred from bed to chair;Turned to right side;Turned to left side;Turned to back - supine  Level of Assistance +2 (takes two people)  Assistive Device Sling  Activity Response Tolerated well  Mobility visit 1 Mobility     Pt sleeping on arrival, awakens to gentle touch but keeps eyes closed throughout most of session. MaxA +2 to roll L/R for sling placement. Transferred bed-chair via hoyer lift. Pt left in recliner with alarm set, needs in reach.    Lennette Seip Mobility Specialist 12/13/23, 3:56 PM

## 2023-12-13 NOTE — Progress Notes (Signed)
 [facility]  ROUNDING NOTE   Subjective:   Patient seen laying in bed Not responsive today  Scheduled for dialysis tomorrow.   Objective:  Vital signs in last 24 hours:  Temp:  [98.2 F (36.8 C)-99.3 F (37.4 C)] 98.8 F (37.1 C) (07/18 0830) Pulse Rate:  [85-96] 85 (07/18 0830) Resp:  [19-22] 20 (07/18 0830) BP: (122-151)/(59-73) 122/59 (07/18 0830) SpO2:  [96 %-97 %] 96 % (07/18 0830)  Weight change:  Filed Weights   12/02/23 1726 12/12/23 0838 12/12/23 1200  Weight: 94.3 kg 95.1 kg 94.1 kg    Intake/Output: I/O last 3 completed shifts: In: 720 [NG/GT:720] Out: 1000 [Other:1000]   Intake/Output this shift:  Total I/O In: 240 [NG/GT:240] Out: -   Physical Exam: General: Chronically ill appearing  Head: Oral mucosa moist  Eyes: Anicteric  Lungs:  Diminished  Heart: Regular rate and rhythm  Abdomen:  Peg tube in place, +distended  Extremities: no peripheral edema.  Neurologic: Somnolent   Skin: No lesions  Access: Rt internal jugular permcath    Basic Metabolic Panel: Recent Labs  Lab 12/09/23 0553 12/10/23 0334 12/11/23 0421 12/12/23 0505 12/13/23 0517  NA 134* 134* 133* 133* 134*  K 4.1 3.8 4.0 4.1 4.0  CL 96* 95* 95* 95* 96*  CO2 23 28 24 24 27   GLUCOSE 95 103* 109* 114* 112*  BUN 86* 49* 78* 92* 51*  CREATININE 7.16* 4.46* 5.88* 7.28* 4.65*  CALCIUM  7.9* 8.1* 8.1* 8.3* 8.1*    Liver Function Tests: Recent Labs  Lab 12/10/23 0334  AST 24  ALT 17  ALKPHOS 76  BILITOT 0.4  PROT 7.7  ALBUMIN  2.1*    No results for input(s): LIPASE, AMYLASE in the last 168 hours. Recent Labs  Lab 12/10/23 2234  AMMONIA 54*    CBC: Recent Labs  Lab 12/09/23 0553 12/10/23 0334 12/10/23 2113 12/11/23 0421 12/12/23 0505 12/13/23 0517  WBC 14.8* 15.0*  --  13.1* 13.1* 14.5*  NEUTROABS  --   --   --   --  10.1* 11.0*  HGB 7.4* 7.0* 7.9* 8.2* 8.0* 8.1*  HCT 23.4* 22.9* 25.5* 26.4* 25.2* 25.3*  MCV 90.0 91.6  --  90.7 92.0  91.3  PLT 410* 374  --  380 358 347    Cardiac Enzymes: No results for input(s): CKTOTAL, CKMB, CKMBINDEX, TROPONINI in the last 168 hours.  BNP: Invalid input(s): POCBNP  CBG: Recent Labs  Lab 12/12/23 0820 12/12/23 1337 12/12/23 1758 12/13/23 0832 12/13/23 1245  GLUCAP 116* 101* 107* 142* 138*    Microbiology: Results for orders placed or performed during the hospital encounter of 10/05/23  Blood Culture (routine x 2)     Status: None   Collection Time: 10/05/23  5:06 AM   Specimen: BLOOD  Result Value Ref Range Status   Specimen Description BLOOD LA  Final   Special Requests   Final    BOTTLES DRAWN AEROBIC AND ANAEROBIC Blood Culture results may not be optimal due to an inadequate volume of blood received in culture bottles   Culture   Final    NO GROWTH 5 DAYS Performed at [facility], [address], [place], [place] [address]    Report Status 10/10/2023 FINAL  Final  Blood Culture (routine x 2)     Status: None   Collection Time: 10/05/23  5:07 AM   Specimen: BLOOD  Result Value Ref Range Status   Specimen Description BLOOD RA  Final   Special  Requests   Final    BOTTLES DRAWN AEROBIC AND ANAEROBIC Blood Culture results may not be optimal due to an inadequate volume of blood received in culture bottles   Culture   Final    NO GROWTH 5 DAYS Performed at [facility], [address] [place], [place] [address]    Report Status 10/10/2023 FINAL  Final  Resp panel by RT-PCR (RSV, Flu A&B, Covid) Anterior Nasal Swab     Status: None   Collection Time: 10/05/23  5:56 AM   Specimen: Anterior Nasal Swab  Result Value Ref Range Status   SARS Coronavirus 2 by RT PCR NEGATIVE NEGATIVE Final    Comment: (NOTE) SARS-CoV-2 target nucleic acids are NOT DETECTED.  The SARS-CoV-2 RNA is generally detectable in upper respiratory specimens during the acute phase of infection. The lowest concentration of SARS-CoV-2 viral copies this assay can  detect is 138 copies/mL. A negative result does not preclude SARS-Cov-2 infection and should not be used as the sole basis for treatment or other patient management decisions. A negative result may occur with  improper specimen collection/handling, submission of specimen other than nasopharyngeal swab, presence of viral mutation(s) within the areas targeted by this assay, and inadequate number of viral copies(<138 copies/mL). A negative result must be combined with clinical observations, patient history, and epidemiological information. The expected result is Negative.  Fact Sheet for Patients:  BloggerCourse.com  Fact Sheet for Healthcare Providers:  SeriousBroker.it  This test is no t yet approved or cleared by the [place]  FDA and  has been authorized for detection and/or diagnosis of SARS-CoV-2 by FDA under an Emergency Use Authorization (EUA). This EUA will remain  in effect (meaning this test can be used) for the duration of the COVID-19 declaration under Section 564(b)(1) of the Act, 21 U.S.C.section 360bbb-3(b)(1), unless the authorization is terminated  or revoked sooner.       Influenza A by PCR NEGATIVE NEGATIVE Final   Influenza B by PCR NEGATIVE NEGATIVE Final    Comment: (NOTE) The Xpert Xpress SARS-CoV-2/FLU/RSV plus assay is intended as an aid in the diagnosis of influenza from Nasopharyngeal swab specimens and should not be used as a sole basis for treatment. Nasal washings and aspirates are unacceptable for Xpert Xpress SARS-CoV-2/FLU/RSV testing.  Fact Sheet for Patients: BloggerCourse.com  Fact Sheet for Healthcare Providers: SeriousBroker.it  This test is not yet approved or cleared by the [place]  FDA and has been authorized for detection and/or diagnosis of SARS-CoV-2 by FDA under an Emergency Use Authorization (EUA). This EUA will remain in  effect (meaning this test can be used) for the duration of the COVID-19 declaration under Section 564(b)(1) of the Act, 21 U.S.C. section 360bbb-3(b)(1), unless the authorization is terminated or revoked.     Resp Syncytial Virus by PCR NEGATIVE NEGATIVE Final    Comment: (NOTE) Fact Sheet for Patients: BloggerCourse.com  Fact Sheet for Healthcare Providers: SeriousBroker.it  This test is not yet approved or cleared by the [place]  FDA and has been authorized for detection and/or diagnosis of SARS-CoV-2 by FDA under an Emergency Use Authorization (EUA). This EUA will remain in effect (meaning this test can be used) for the duration of the COVID-19 declaration under Section 564(b)(1) of the Act, 21 U.S.C. section 360bbb-3(b)(1), unless the authorization is terminated or revoked.  Performed at [facility], [address] [place], [place] [address]   Respiratory (~20 pathogens) panel by PCR     Status: None   Collection  Time: 10/05/23  8:11 AM   Specimen: Nasopharyngeal Swab; Respiratory  Result Value Ref Range Status   Adenovirus NOT DETECTED NOT DETECTED Final   Coronavirus 229E NOT DETECTED NOT DETECTED Final    Comment: (NOTE) The Coronavirus on the Respiratory Panel, DOES NOT test for the novel  Coronavirus (2019 nCoV)    Coronavirus HKU1 NOT DETECTED NOT DETECTED Final   Coronavirus NL63 NOT DETECTED NOT DETECTED Final   Coronavirus OC43 NOT DETECTED NOT DETECTED Final   Metapneumovirus NOT DETECTED NOT DETECTED Final   Rhinovirus / Enterovirus NOT DETECTED NOT DETECTED Final   Influenza A NOT DETECTED NOT DETECTED Final   Influenza B NOT DETECTED NOT DETECTED Final   Parainfluenza Virus 1 NOT DETECTED NOT DETECTED Final   Parainfluenza Virus 2 NOT DETECTED NOT DETECTED Final   Parainfluenza Virus 3 NOT DETECTED NOT DETECTED Final   Parainfluenza Virus 4 NOT DETECTED NOT DETECTED Final   Respiratory  Syncytial Virus NOT DETECTED NOT DETECTED Final   Bordetella pertussis NOT DETECTED NOT DETECTED Final   Bordetella Parapertussis NOT DETECTED NOT DETECTED Final   Chlamydophila pneumoniae NOT DETECTED NOT DETECTED Final   Mycoplasma pneumoniae NOT DETECTED NOT DETECTED Final    Comment: Performed at [facility], [address] [place], [place] [address]  Expectorated Sputum Assessment w Gram Stain, Rflx to Resp Cult     Status: None   Collection Time: 10/05/23  9:07 AM   Specimen: Sputum  Result Value Ref Range Status   Specimen Description SPUTUM  Final   Special Requests NONE  Final   Sputum evaluation   Final    Sputum specimen not acceptable for testing.  Please recollect.   C/KERRY NELSON AT 1005 10/05/23.PMF Performed at [facility], [address] [place], [place] [address]    Report Status 10/05/2023 FINAL  Final  Expectorated Sputum Assessment w Gram Stain, Rflx to Resp Cult     Status: None   Collection Time: 10/05/23 10:50 AM  Result Value Ref Range Status   Specimen Description EXPECTORATED SPUTUM  Final   Special Requests NONE  Final   Sputum evaluation   Final    THIS SPECIMEN IS ACCEPTABLE FOR SPUTUM CULTURE Performed at [facility], [address], [place], [place] [address]    Report Status 10/05/2023 FINAL  Final  Culture, Respiratory w Gram Stain     Status: None   Collection Time: 10/05/23 10:50 AM  Result Value Ref Range Status   Specimen Description   Final    EXPECTORATED SPUTUM Performed at [facility], [address] [place], [place] [address]    Special Requests   Final    NONE Reflexed from [phone] Performed at [facility], [address] [place], [place] [address]    Gram Stain   Final    RARE WBC SEEN RARE VONNE POSITIVE RODS RARE VONNE POSITIVE COCCI RARE GRAM NEGATIVE RODS    Culture   Final    FEW Normal respiratory flora-no Staph aureus or Pseudomonas seen Performed at [facility], [address] [place], [place] [address]    Report Status 10/07/2023 FINAL  Final  MRSA Next Gen by PCR, Nasal     Status: None   Collection Time: 10/06/23 12:57 AM   Specimen: Nasal Mucosa; Nasal Swab  Result Value Ref Range Status   MRSA by PCR Next Gen NOT DETECTED NOT DETECTED Final    Comment: (NOTE) The GeneXpert MRSA Assay (  FDA approved for NASAL specimens only), is one component of a comprehensive MRSA colonization surveillance program. It is not intended to diagnose MRSA infection nor to guide or monitor treatment for MRSA infections. Test performance is not FDA approved in patients less than 73 years old. Performed at [facility], [address] [place], [place] [address]   Culture, Respiratory w Gram Stain     Status: None   Collection Time: 10/06/23 11:37 AM   Specimen: INDUCED SPUTUM  Result Value Ref Range Status   Specimen Description   Final    INDUCED SPUTUM Performed at [facility], [address] [place], [place] [address]    Special Requests   Final    NONE Performed at [facility], [address] [place], [place] [address]    Gram Stain   Final    FEW WBC PRESENT,BOTH PMN AND MONONUCLEAR FEW GRAM POSITIVE RODS    Culture   Final    MODERATE LACTOBACILLUS FERMENTUM Standardized susceptibility testing for this organism is not available. Performed at [facility], [address], [place], [place] [address]    Report Status 10/09/2023 FINAL  Final  Culture, blood (x 2)     Status: None   Collection Time: 10/22/23  1:00 AM   Specimen: BLOOD  Result Value Ref Range Status   Specimen Description BLOOD BLOOD RIGHT ARM  Final   Special Requests   Final    BOTTLES DRAWN AEROBIC AND ANAEROBIC Blood Culture adequate volume   Culture   Final    NO GROWTH 5 DAYS Performed at [facility], [address] [place], [place] [address]    Report Status 10/27/2023 FINAL  Final  Culture, blood (x 2)      Status: None   Collection Time: 10/22/23  1:00 AM   Specimen: BLOOD  Result Value Ref Range Status   Specimen Description BLOOD BLOOD RIGHT ARM  Final   Special Requests   Final    BOTTLES DRAWN AEROBIC AND ANAEROBIC Blood Culture adequate volume   Culture   Final    NO GROWTH 5 DAYS Performed at [facility], [address] [place], [place] [address]    Report Status 10/27/2023 FINAL  Final  Resp panel by RT-PCR (RSV, Flu A&B, Covid) Anterior Nasal Swab     Status: None   Collection Time: 11/09/23  5:31 PM   Specimen: Anterior Nasal Swab  Result Value Ref Range Status   SARS Coronavirus 2 by RT PCR NEGATIVE NEGATIVE Final    Comment: (NOTE) SARS-CoV-2 target nucleic acids are NOT DETECTED.  The SARS-CoV-2 RNA is generally detectable in upper respiratory specimens during the acute phase of infection. The lowest concentration of SARS-CoV-2 viral copies this assay can detect is 138 copies/mL. A negative result does not preclude SARS-Cov-2 infection and should not be used as the sole basis for treatment or other patient management decisions. A negative result may occur with  improper specimen collection/handling, submission of specimen other than nasopharyngeal swab, presence of viral mutation(s) within the areas targeted by this assay, and inadequate number of viral copies(<138 copies/mL). A negative result must be combined with clinical observations, patient history, and epidemiological information. The expected result is Negative.  Fact Sheet for Patients:  BloggerCourse.com  Fact Sheet for Healthcare Providers:  SeriousBroker.it  This test is no t yet approved or cleared by the [place]  FDA and  has been authorized for detection and/or diagnosis of SARS-CoV-2 by FDA  under an Emergency Use Authorization (EUA). This EUA will remain  in effect (meaning this test can be used) for the duration of the COVID-19  declaration under Section 564(b)(1) of the Act, 21 U.S.C.section 360bbb-3(b)(1), unless the authorization is terminated  or revoked sooner.       Influenza A by PCR NEGATIVE NEGATIVE Final   Influenza B by PCR NEGATIVE NEGATIVE Final    Comment: (NOTE) The Xpert Xpress SARS-CoV-2/FLU/RSV plus assay is intended as an aid in the diagnosis of influenza from Nasopharyngeal swab specimens and should not be used as a sole basis for treatment. Nasal washings and aspirates are unacceptable for Xpert Xpress SARS-CoV-2/FLU/RSV testing.  Fact Sheet for Patients: BloggerCourse.com  Fact Sheet for Healthcare Providers: SeriousBroker.it  This test is not yet approved or cleared by the [place]  FDA and has been authorized for detection and/or diagnosis of SARS-CoV-2 by FDA under an Emergency Use Authorization (EUA). This EUA will remain in effect (meaning this test can be used) for the duration of the COVID-19 declaration under Section 564(b)(1) of the Act, 21 U.S.C. section 360bbb-3(b)(1), unless the authorization is terminated or revoked.     Resp Syncytial Virus by PCR NEGATIVE NEGATIVE Final    Comment: (NOTE) Fact Sheet for Patients: BloggerCourse.com  Fact Sheet for Healthcare Providers: SeriousBroker.it  This test is not yet approved or cleared by the [place]  FDA and has been authorized for detection and/or diagnosis of SARS-CoV-2 by FDA under an Emergency Use Authorization (EUA). This EUA will remain in effect (meaning this test can be used) for the duration of the COVID-19 declaration under Section 564(b)(1) of the Act, 21 U.S.C. section 360bbb-3(b)(1), unless the authorization is terminated or revoked.  Performed at [facility], [address] [place], [place] [address]   Respiratory (~20 pathogens) panel by PCR     Status: None   Collection Time:  11/09/23  5:31 PM   Specimen: Nasopharyngeal Swab; Respiratory  Result Value Ref Range Status   Adenovirus NOT DETECTED NOT DETECTED Final   Coronavirus 229E NOT DETECTED NOT DETECTED Final    Comment: (NOTE) The Coronavirus on the Respiratory Panel, DOES NOT test for the novel  Coronavirus (2019 nCoV)    Coronavirus HKU1 NOT DETECTED NOT DETECTED Final   Coronavirus NL63 NOT DETECTED NOT DETECTED Final   Coronavirus OC43 NOT DETECTED NOT DETECTED Final   Metapneumovirus NOT DETECTED NOT DETECTED Final   Rhinovirus / Enterovirus NOT DETECTED NOT DETECTED Final   Influenza A NOT DETECTED NOT DETECTED Final   Influenza B NOT DETECTED NOT DETECTED Final   Parainfluenza Virus 1 NOT DETECTED NOT DETECTED Final   Parainfluenza Virus 2 NOT DETECTED NOT DETECTED Final   Parainfluenza Virus 3 NOT DETECTED NOT DETECTED Final   Parainfluenza Virus 4 NOT DETECTED NOT DETECTED Final   Respiratory Syncytial Virus NOT DETECTED NOT DETECTED Final   Bordetella pertussis NOT DETECTED NOT DETECTED Final   Bordetella Parapertussis NOT DETECTED NOT DETECTED Final   Chlamydophila pneumoniae NOT DETECTED NOT DETECTED Final   Mycoplasma pneumoniae NOT DETECTED NOT DETECTED Final    Comment: Performed at [facility], [address], [place], [place] [address]  Culture, blood (Routine X 2) w Reflex to ID Panel     Status: None   Collection Time: 11/09/23  6:16 PM   Specimen: BLOOD  Result Value Ref Range Status   Specimen Description   Final    BLOOD RIGHT ANTECUBITAL Performed at [facility], [address] [place] [place], [place] [address]    Special Requests   Final    BOTTLES DRAWN AEROBIC ONLY Blood Culture adequate volume Performed at [facility], [address] [place], [place] [address]    Culture   Final    NO GROWTH 11 DAYS Performed at [facility], [address] [place], [place] [address]    Report Status 11/20/2023 FINAL  Final  Culture, blood (Routine X  2) w Reflex to ID Panel     Status: None   Collection Time: 11/09/23  6:23 PM   Specimen: BLOOD  Result Value Ref Range Status   Specimen Description   Final    BLOOD BLOOD LEFT FOREARM Performed at [facility], [address] [place], [place] [address]    Special Requests   Final    BOTTLES DRAWN AEROBIC AND ANAEROBIC Blood Culture adequate volume Performed at [facility], [address], [place], [place] [address]    Culture   Final    NO GROWTH 11 DAYS Performed at [facility], [address] [place], [place] [address]    Report Status 11/20/2023 FINAL  Final  Urine Culture     Status: Abnormal   Collection Time: 11/10/23  1:58 AM   Specimen: Urine, Random  Result Value Ref Range Status   Specimen Description   Final    URINE, RANDOM Performed at [facility], [address] [place], [place] [address]    Special Requests   Final    NONE Reflexed from [phone] Performed at [facility], [address] [place], [place] [address]    Culture 60,000 COLONIES/mL ENTEROCOCCUS FAECALIS (A)  Final   Report Status 11/12/2023 FINAL  Final   Organism ID, Bacteria ENTEROCOCCUS FAECALIS (A)  Final      Susceptibility   Enterococcus faecalis - MIC*    AMPICILLIN  <=2 SENSITIVE Sensitive     NITROFURANTOIN <=16 SENSITIVE Sensitive     VANCOMYCIN  1 SENSITIVE Sensitive     * 60,000 COLONIES/mL ENTEROCOCCUS FAECALIS  Culture, blood (x 2)     Status: None   Collection Time: 11/25/23 10:31 PM   Specimen: BLOOD  Result Value Ref Range Status   Specimen Description BLOOD BLOOD LEFT ARM  Final   Special Requests   Final    BOTTLES DRAWN AEROBIC AND ANAEROBIC Blood Culture adequate volume   Culture   Final    NO GROWTH 5 DAYS Performed at [facility], [address] [place], [place] [address]    Report Status 11/30/2023 FINAL  Final  Culture, blood (x 2)     Status: None   Collection Time: 11/25/23 10:31 PM   Specimen: BLOOD   Result Value Ref Range Status   Specimen Description BLOOD BLOOD RIGHT ARM  Final   Special Requests   Final    BOTTLES DRAWN AEROBIC AND ANAEROBIC Blood Culture adequate volume   Culture   Final    NO GROWTH 5 DAYS Performed at [facility], [address] [place], [place] [address]    Report Status 11/30/2023 FINAL  Final  Gastrointestinal Panel by PCR , Stool     Status: None   Collection Time: 11/30/23  5:10 PM   Specimen: Stool  Result Value Ref Range Status   Campylobacter species NOT DETECTED NOT DETECTED Final   Plesimonas shigelloides NOT DETECTED NOT DETECTED Final   Salmonella species NOT DETECTED NOT DETECTED Final   Yersinia enterocolitica NOT  DETECTED NOT DETECTED Final   Vibrio species NOT DETECTED NOT DETECTED Final   Vibrio cholerae NOT DETECTED NOT DETECTED Final   Enteroaggregative E coli (EAEC) NOT DETECTED NOT DETECTED Final   Enteropathogenic E coli (EPEC) NOT DETECTED NOT DETECTED Final   Enterotoxigenic E coli (ETEC) NOT DETECTED NOT DETECTED Final   Shiga like toxin producing E coli (STEC) NOT DETECTED NOT DETECTED Final   Shigella/Enteroinvasive E coli (EIEC) NOT DETECTED NOT DETECTED Final   Cryptosporidium NOT DETECTED NOT DETECTED Final   Cyclospora cayetanensis NOT DETECTED NOT DETECTED Final   Entamoeba histolytica NOT DETECTED NOT DETECTED Final   Giardia lamblia NOT DETECTED NOT DETECTED Final   Adenovirus F40/41 NOT DETECTED NOT DETECTED Final   Astrovirus NOT DETECTED NOT DETECTED Final   Norovirus GI/GII NOT DETECTED NOT DETECTED Final   Rotavirus A NOT DETECTED NOT DETECTED Final   Sapovirus (I, II, IV, and V) NOT DETECTED NOT DETECTED Final    Comment: Performed at [facility], [address] [place], [place] [address]    Coagulation Studies: No results for input(s): LABPROT, INR in the last 72 hours.   Urinalysis: No results for input(s): COLORURINE, LABSPEC, PHURINE, GLUCOSEU, HGBUR, BILIRUBINUR,  KETONESUR, PROTEINUR, UROBILINOGEN, NITRITE, LEUKOCYTESUR in the last 72 hours.  Invalid input(s): APPERANCEUR     Imaging: No results found.        Medications:    albumin  human 25 g (12/09/23 1054)    sodium chloride    Intravenous Once   Chlorhexidine  Gluconate Cloth  6 each Topical Q0600   epoetin  alfa-epbx (RETACRIT ) injection  10,000 Units Intravenous Q T,Th,Sat-1800   feeding supplement (NEPRO CARB STEADY)  237 mL Per Tube 5 X Daily   feeding supplement (PROSource TF20)  60 mL Per Tube Daily   ferrous sulfate   325 mg Per Tube Daily   free water   120 mL Per Tube 5 X Daily   gentamicin  ointment   Topical TID   heparin  injection (subcutaneous)  5,000 Units Subcutaneous Q8H   insulin  aspart  10 Units Subcutaneous Once   midodrine   5 mg Per Tube TID WC   multivitamin  1 tablet Per Tube QHS   pantoprazole  (PROTONIX ) IV  40 mg Intravenous Q12H   saccharomyces boulardii  250 mg Per Tube BID   acetaminophen  **OR** acetaminophen , albumin  human, artificial tears, bisacodyl , ipratropium-albuterol , [DISCONTINUED] ondansetron  **OR** ondansetron  (ZOFRAN ) IV, mouth rinse, sennosides  Assessment/ Plan:  Mr. Robert Vega is a 83 y.o.  male with obstructive sleep apnea, GERD, obesity, hypertension, dysphagia, severe esophageal dysmotility with achalasia status post PEG tube placement, recent aspiration pneumonia acute respiratory failure status post extubation 10/09/2023, who was admitted to [facility] on 10/05/2023 for Aspiration into respiratory tract, initial encounter [T17.908A] Severe sepsis (HCC) [A41.9, R65.20] History of dysphagia [Z87.898] Fever, unspecified fever cause [R50.9] Multifocal pneumonia [J18.9]   1.  End stage renal disease requiting hemodialysis. Due to lack of meaningful recovery, we feel patient is now considered ESRD.  Patient is critically ill.  He was started on hemodialysis for severe hyperkalemia and uremia. Vascular surgery placed PermCath on  6/26.  - Dialysis received yesterday, UF 1L achieved. - Next treatment scheduled for Saturday. - Due to correctable uremic involvement, we will draw a BUN before and after treatment to evaluate clearance. - Accepted at [facility] on a TTS schedule, chair time 7:10am.  - TOC continues search for rehab.    2.  Sepsis and hypotension due to aspiration pneumonia, found to have  distal esophageal obstruction with severe dysmotility.  He is status post botulinum toxin injection to the lower esophageal sphincter.  PEG tube placed this admission. Receiving continuous tube feeds with scheduled free water  flushes.  - Completed Zosyn  - Continue midodrine  -Blood pressure 122/59.   4.Anemia in the setting of renal failure Lab Results  Component Value Date   HGB 8.1 (L) 12/13/2023  - Continue IV EPO  with HD treatments.  -Hemoglobin 8.1, blood transfusion received on 7/15  5.  Bilateral hydronephrosis Noted on CT.Urology team has evaluated the patient and it is thought to be secondary to reflux.  Due to health, further intervention deferred at this time.     LOS: 69 Kiaria Quinnell 7/18/20252:08 PM

## 2023-12-13 NOTE — Progress Notes (Signed)
 Physical Therapy Treatment Patient Details Name: Robert Vega MRN: [id number] DOB: 1940-06-28 Today's Date: 12/13/2023   History of Present Illness 83 y.o male with significant PMH of OSA, GERD, Obesity, HTN, Dysphagia: EGD 03/2022 with food in upper esophagus complicated by aspiration event, cardiac arrest with round of CPR, and post resuscitation EGD with concern for lack of peristalsis. Pt presented to ED on 10/05/2023 with hypoxia, fever and generalized weakness; developed acute respiratory failure requiring intubation 5/10 due to aspiration pneumonia, extubated 5/15, but had significant agitation required brief course of Precedex ; pt now s/p exploratory laparotomy with closure of gastrotomy and insertion of gastrostomy tube on 10/22/23.  PT order discontinued by MD 11/10/23 and new PT consult received 11/14/23.  Pt s/p R temporary femoral vein dialysis catheter placement 11/11/23; now removed; pt now with HD perm cath.    PT Comments  Seen for additional treatment session per request of attending.  Patient remains grossly lethargic and minimally interactive with therapist, environment despite max multi-modal stimulation techniques (see details in note below).  Constantly grunting, groaning throughout session; no attempts at meaningful verbalizations.  Some audible secretions noted; mouth-breathing.  Vitals stable and WFL throughout session: BP 130/64, HR 84, SaO2 98% on 3L supplemental O2.  Demonstrates intermittent spontaneous movement of each extremity during session, but unable to follow commands for purposeful movement and intermittently resistive to manual facilitation/hand-over-hand movement of all extremities.  Does not open eyes or physically acknowledge therapist presence in room beyond grunting/groaning with max stimulation. Requires extensive, total assist +2 for all bed mobility, repositioning needs; limited tolerance for positioning in sidelying, though does tolerate chair position in  bed without difficulty.  Per attending, patient medically stable and likely at new medical baseline.    Anticipate extensive physical assist/total care (+2) for all care needs at this time; functional progress/participation limited by persistent cognitive deficits impacting ability to follow commands, level of alertness and ability to actively engage with therapist and environment.  Very limited ability to meaningfully participate with skilled therapy sessions at this time. Anticipate patient to benefit from LTC until cognition, alertness improve to a level that allows patient to more actively participate and progress with skilled interventions.    If plan is discharge home, recommend the following: Two people to help with walking and/or transfers;Two people to help with bathing/dressing/bathroom;Assistance with cooking/housework;Direct supervision/assist for medications management;Direct supervision/assist for financial management;Assist for transportation;Help with stairs or ramp for entrance;Supervision due to cognitive status   Can travel by private vehicle     No  Equipment Recommendations       Recommendations for Other Services       Precautions / Restrictions Precautions Precautions: Fall Recall of Precautions/Restrictions: Impaired Precaution/Restrictions Comments: PEG; perm cath (HD); rectal tube Restrictions Weight Bearing Restrictions Per Provider Order: No     Mobility  Bed Mobility Overal bed mobility: Needs Assistance Bed Mobility: Rolling Rolling: Total assist, +2 for physical assistance         General bed mobility comments: patient generally rigid, resistive to rolling and repositioning; poor tolerance for attempts at L sidelying (to faciliate pressure relief, cervical ROM)    Transfers                   General transfer comment: unsafe/unable to tolerate due to level of alertness, poor tolerance to movement/rolling    Ambulation/Gait                General Gait Details: unsafe/unable to tolerate due to level  of alertness, poor tolerance to movement/rolling   Stairs             Wheelchair Mobility     Tilt Bed    Modified Rankin (Stroke Patients Only)       Balance                                            Communication Communication Communication: Impaired Factors Affecting Communication: Difficulty expressing self  Cognition Arousal: Lethargic Behavior During Therapy: Flat affect   PT - Cognitive impairments: No family/caregiver present to determine baseline, Difficult to assess Difficult to assess due to: Impaired communication, Level of arousal                     PT - Cognition Comments: Patient maintains eyes closed throughout session despite max tactile, verbal and sensory stimulation.  Frequent grunting and moaning; very limited purposeful response to therapist        Cueing Cueing Techniques: Verbal cues, Gestural cues, Tactile cues  Exercises Other Exercises Other Exercises: Attempted passive ROM to bilat UE/LEs, 1x5-6 each extremity--patient intermittently resistant to facilitated movement; unable to follow commands for purposeful movement.  Does spontaneously mobilize each extremity at times, but not for purposeful movements/activities. Other Exercises: Rolling bilat, total assist +2; generally stiff and rigid, poor dissociation of trunk/extremities; poor tolerance for movement and repositioning efforts Other Exercises: Transitioned to chair position in bed for pressure relief, repositioning, tolerance to upright and level of alertness/stimulation.  Tolerates without difficulty, but limited response to change in position; limited engagement with therapist, environment Other Exercises: Attempted various techniques to stimulate arousal throughout session-auditory, sensory, temperature (cool washcloth to face/hands), positioning (upright), environment (lighting, sound),  vestibular (rolling) and movement (ROM). Patient with minimal response to any/all techniques, only grunting/mumbling in response to max stimulation of any kind.    General Comments        Pertinent Vitals/Pain Pain Assessment Pain Assessment: Faces Faces Pain Scale: Hurts little more Breathing: occasional labored breathing, short period of hyperventilation Negative Vocalization: occasional moan/groan, low speech, negative/disapproving quality Facial Expression: smiling or inexpressive Body Language: tense, distressed pacing, fidgeting Consolability: distracted or reassured by voice/touch PAINAD Score: 4 Facial Expression: Relaxed, neutral Body Movements: Protection Muscle Tension: Tense, rigid Compliance with ventilator (intubated pts.): N/A Vocalization (extubated pts.): N/A CPOT Total: 2 Pain Location: unable to articulate; increased grimacing, moaning, restlessness with attempts at rolling or positioning in sidelying Pain Descriptors / Indicators: Grimacing, Guarding Pain Intervention(s): Monitored during session, Repositioned, Limited activity within patient's tolerance    Home Living                          Prior Function            PT Goals (current goals can now be found in the care plan section) Acute Rehab PT Goals Patient Stated Goal: none stated PT Goal Formulation: Patient unable to participate in goal setting Time For Goal Achievement: 12/19/23 Potential to Achieve Goals: Poor Progress towards PT goals: Not progressing toward goals - comment (level of alertness)    Frequency    Min 1X/week      PT Plan      Co-evaluation              AM-PAC PT 6 Clicks Mobility   Outcome Measure  Help needed  turning from your back to your side while in a flat bed without using bedrails?: Total Help needed moving from lying on your back to sitting on the side of a flat bed without using bedrails?: Total Help needed moving to and from a bed to a  chair (including a wheelchair)?: Total Help needed standing up from a chair using your arms (e.g., wheelchair or bedside chair)?: Total Help needed to walk in hospital room?: Total Help needed climbing 3-5 steps with a railing? : Total 6 Click Score: 6    End of Session Equipment Utilized During Treatment: Oxygen Activity Tolerance: Treatment limited secondary to medical complications (Comment) Patient left: in bed;with bed alarm set Nurse Communication: Mobility status;Need for lift equipment PT Visit Diagnosis: Other abnormalities of gait and mobility (R26.89);Muscle weakness (generalized) (M62.81)     Time: 8585-8565 PT Time Calculation (min) (ACUTE ONLY): 20 min  Charges:    $Therapeutic Activity: 8-22 mins PT General Charges $$ ACUTE PT VISIT: 1 Visit                     Merlen Gurry H. Delores, PT, DPT, NCS 12/13/23, 5:27 PM [phone]

## 2023-12-13 NOTE — Progress Notes (Signed)
 Date of Admission:  10/05/2023      ID: Robert Vega is a 83 y.o. male Principal Problem:   Severe sepsis (HCC) Active Problems:   Encounter for dialysis and dialysis catheter care (HCC)   GERD (gastroesophageal reflux disease)   Dysphagia   Essential hypertension, benign   Multifocal pneumonia   Acute respiratory failure with hypoxia and hypercapnia (HCC)   History of dysphagia   Aspiration pneumonia (HCC)   Achalasia of esophagus   Acute delirium   Obesity (BMI 30-39.9)   Generalized weakness   Gastrostomy complication (HCC)   AKI (acute kidney injury) (HCC)   Generalized abdominal pain   Malnutrition of moderate degree   Melena   Leukocytosis   Perinephric hematoma   Hydronephrosis   Encephalopathy   5/10 admission for weakness, hypoxia- aspiration pneumonia due to achlasia  5/10 CT b/l infitrates- intubated- extubated 5/14 Dilated esophagus 5/13 PEG instertion by GI 5/20 repeat EGD botulinum injection 5/25 pulled PEG out 5/27 exp lap for dislodged peg in the abdominal cavity 6/21melena 6/10 EGD 6/16 HD cath- dialysis started 6/26 permanent HD cath  Antibiotic therapy Ceftriaxone  5/10-5/14 Azithro 5/10-5/11 Metronidazole  5/10 Zosyn  5/26-5/31 Cefepime  6/30-7/3 Zosyn  7/3 >>7/15    Subjective: NA No change in his clinical status  Medications:   sodium chloride    Intravenous Once   Chlorhexidine  Gluconate Cloth  6 each Topical Q0600   epoetin  alfa-epbx (RETACRIT ) injection  10,000 Units Intravenous Q T,Th,Sat-1800   feeding supplement (NEPRO CARB STEADY)  237 mL Per Tube 5 X Daily   feeding supplement (PROSource TF20)  60 mL Per Tube Daily   ferrous sulfate   325 mg Per Tube Daily   free water   120 mL Per Tube 5 X Daily   gentamicin  ointment   Topical TID   heparin  injection (subcutaneous)  5,000 Units Subcutaneous Q8H   insulin  aspart  10 Units Subcutaneous Once   midodrine   5 mg Per Tube TID WC   multivitamin  1 tablet Per Tube QHS    pantoprazole  (PROTONIX ) IV  40 mg Intravenous Q12H   saccharomyces boulardii  250 mg Per Tube BID    Objective: Vital signs in last 24 hours: Patient Vitals for the past 24 hrs:  BP Temp Temp src Pulse Resp SpO2  12/13/23 0830 (!) 122/59 98.8 F (37.1 C) -- 85 20 96 %  12/13/23 0442 122/62 98.6 F (37 C) Oral 87 -- 97 %  12/13/23 0040 134/71 99.3 F (37.4 C) Axillary 88 19 97 %  12/12/23 2136 (!) 142/73 98.9 F (37.2 C) Oral 96 19 97 %  12/12/23 1740 (!) 151/67 98.2 F (36.8 C) Axillary 89 (!) 22 97 %     LDA HD cath PHYSICAL EXAM:  General: lethargic, somnolent, no verbal Lungs:b/l air entry-  Heart: s1s2 Abdomen: Soft, peg Rectal tube CNS unable to assess Lab Results    Latest Ref Rng & Units 12/13/2023    5:17 AM 12/12/2023    5:05 AM 12/11/2023    4:21 AM  CBC  WBC 4.0 - 10.5 K/uL 14.5  13.1  13.1   Hemoglobin 13.0 - 17.0 g/dL 8.1  8.0  8.2   Hematocrit 39.0 - 52.0 % 25.3  25.2  26.4   Platelets 150 - 400 K/uL 347  358  380        Latest Ref Rng & Units 12/13/2023    5:17 AM 12/12/2023    5:05 AM 12/11/2023    4:21 AM  CMP  Glucose 70 - 99 mg/dL 887  885  890   BUN 8 - 23 mg/dL 51  92  78   Creatinine 0.61 - 1.24 mg/dL 5.34  2.71  4.11   Sodium 135 - 145 mmol/L 134  133  133   Potassium 3.5 - 5.1 mmol/L 4.0  4.1  4.0   Chloride 98 - 111 mmol/L 96  95  95   CO2 22 - 32 mmol/L 27  24  24    Calcium  8.9 - 10.3 mg/dL 8.1  8.3  8.1     Wbc 74.5>85.1  Microbiology: 5/10 BC NG 5/27 BC NG 6/14 BC- NG 6/30 BC NG Studies/Results:  B/l infitrates   Assessment/Plan:  Prolonged and Complicated hospital stay.  Encephalopathy- seen by neuro - ( TSH, cortisol, N, B12 N)  Aspiration pneumonia- resolved Hypoxia - intubated and then extubated Achalasia, dilated esophagus para esophageal hernia  PEG  Dislodgement and peritonitis- exp lap on 5/27 Peg in place  Leucocytosis- multifactorial- reactive to hydronephrosis, perinephric stranding - possible  infection,  perinephric hematoma, aspiration pneumonia,  Been on antibiotics sine 6/30 - zosyn  started on 7/3   improvement in leucocytosis- 25.4>13.1 - Dc on 7/15--  WBC 14.6 Continue to observe At this time cdiff is less likely- Diarrhea is due to PEG feeds ( initially thought to be from miralax ) observe closely and will test if needed   B/l hydroureteronephrosis- unclear etiology- bladder wall thickening. Did he have BOO? No calculus CT scan repeated showed proteinaceous hemorrhagic material in kidneys- Will he need perc nephrostomy?  Left perinephric hematoma ? etiology Urology following- if wbc worsens would need repeat imaging   AKI since 5/26 now on dialysis  Pt seen by palliative-   Discussed with Dr.Yates  ID will not see him this weekend-  On call ID available for urgent issues only

## 2023-12-13 NOTE — TOC Progression Note (Signed)
 Transition of Care [facility]) - Progression Note    Patient Details  Name: Robert Vega MRN: [id number] Date of Birth: [birth year]  [facility] of [facility] CM/SW Contact  [name], RN Phone Number: 12/13/2023, 3:32 PM  Clinical Narrative:     Secure email below sent to [facility], son Financial POA, and Delphine [facility] supervisor  Good Afternoon  The medical team, as well as leadership would like to arrange an interdisciplinary conference call on Monday to discuss plan of care and discharge plan.  What time would you and your dad be available, and I will coordinate with the team  Read receipt requested   Expected Discharge Plan: Skilled Nursing Facility Barriers to Discharge: Continued Medical Work up  Expected Discharge Plan and Services                                               Social Determinants of Health (SDOH) Interventions SDOH Screenings   Food Insecurity: Patient Unable To Answer (10/06/2023)  Recent Concern: Food Insecurity - Food Insecurity Present (09/11/2023)   Received from [facility]  Housing: Patient Unable To Answer (10/06/2023)  Recent Concern: Housing - High Risk (09/11/2023)   Received from [facility]  Transportation Needs: Patient Unable To Answer (10/06/2023)  Utilities: Patient Unable To Answer (10/06/2023)  Depression (PHQ2-9): Low Risk  (05/07/2023)  Financial Resource Strain: Medium Risk (09/11/2023)   Received from [facility]  Social Connections: Unknown (10/06/2023)  Tobacco Use: Medium Risk (11/05/2023)    Readmission Risk Interventions     No data to display

## 2023-12-14 DIAGNOSIS — J189 Pneumonia, unspecified organism: Secondary | ICD-10-CM | POA: Diagnosis not present

## 2023-12-14 DIAGNOSIS — A419 Sepsis, unspecified organism: Secondary | ICD-10-CM | POA: Diagnosis not present

## 2023-12-14 DIAGNOSIS — Z87898 Personal history of other specified conditions: Secondary | ICD-10-CM | POA: Diagnosis not present

## 2023-12-14 DIAGNOSIS — T17908A Unspecified foreign body in respiratory tract, part unspecified causing other injury, initial encounter: Secondary | ICD-10-CM | POA: Diagnosis not present

## 2023-12-14 LAB — RENAL FUNCTION PANEL
Albumin: 1.9 g/dL — ABNORMAL LOW (ref 3.5–5.0)
Anion gap: 12 (ref 5–15)
BUN: 85 mg/dL — ABNORMAL HIGH (ref 8–23)
CO2: 27 mmol/L (ref 22–32)
Calcium: 8.1 mg/dL — ABNORMAL LOW (ref 8.9–10.3)
Chloride: 94 mmol/L — ABNORMAL LOW (ref 98–111)
Creatinine, Ser: 6.36 mg/dL — ABNORMAL HIGH (ref 0.61–1.24)
GFR, Estimated: 8 mL/min — ABNORMAL LOW (ref >60)
Glucose, Bld: 128 mg/dL — ABNORMAL HIGH (ref 70–99)
Phosphorus: 5.7 mg/dL — ABNORMAL HIGH (ref 2.5–4.6)
Potassium: 3.9 mmol/L (ref 3.5–5.1)
Sodium: 133 mmol/L — ABNORMAL LOW (ref 135–145)

## 2023-12-14 LAB — CBC
HCT: 26.6 % — ABNORMAL LOW (ref 39.0–52.0)
Hemoglobin: 8.3 g/dL — ABNORMAL LOW (ref 13.0–17.0)
MCH: 28.9 pg (ref 26.0–34.0)
MCHC: 31.2 g/dL (ref 30.0–36.0)
MCV: 92.7 fL (ref 80.0–100.0)
Platelets: 376 K/uL (ref 150–400)
RBC: 2.87 MIL/uL — ABNORMAL LOW (ref 4.22–5.81)
RDW: 16.2 % — ABNORMAL HIGH (ref 11.5–15.5)
WBC: 13.3 K/uL — ABNORMAL HIGH (ref 4.0–10.5)
nRBC: 0 % (ref 0.0–0.2)

## 2023-12-14 LAB — GLUCOSE, CAPILLARY
Glucose-Capillary: 123 mg/dL — ABNORMAL HIGH (ref 70–99)
Glucose-Capillary: 133 mg/dL — ABNORMAL HIGH (ref 70–99)
Glucose-Capillary: 128 mg/dL — ABNORMAL HIGH (ref 70–99)
Glucose-Capillary: 126 mg/dL — ABNORMAL HIGH (ref 70–99)

## 2023-12-14 MED ORDER — HEPARIN SODIUM (PORCINE) 1000 UNIT/ML IJ SOLN
INTRAMUSCULAR | Status: AC
  Filled 2023-12-14: qty 10

## 2023-12-14 MED ORDER — EPOETIN ALFA-EPBX 10000 UNIT/ML IJ SOLN
INTRAMUSCULAR | Status: AC
  Filled 2023-12-14: qty 1

## 2023-12-14 NOTE — Plan of Care (Signed)

## 2023-12-14 NOTE — Progress Notes (Signed)
 Progress Note   Patient: Robert Vega FMW:[id number] DOB: June 11, 1940 DOA: 10/05/2023     70 DOS: the patient was seen and examined on 12/14/2023   Brief hospital course: 83 yo with h/o OSA, GERD, HTN, class 1 obesity, and dysphagia who presented on 5/10 with fever and hypoxia.  He was diagnosed with sepsis due to PNA with possible aspiration and effectively treated with antibiotics.  He had an aspiration event that led to respiratory arrest on 5/10 (no CPR provided during this hospitalization on extensive chart review) and was transferred to the ICU intubated.  (He also had prior EGD in 03/2022 with food in upper esophagus complicated by aspiration event, cardiac arrest with round of CPR, and post resuscitation EGD with concern for lack of peristalsis. Hx prior EGD 08/2020 with note of abnormal cricopharyngeus, decrease in motility in esophagus, and spastic LES.)  He was extubated on 5/14 but had severe delirium.  He underwent botox  injection into the LES on 5/20.  5/21 esophagram with diffusely distended esophagus with distal obstruction and severe dysmotility suggestive of achalasia. He underwent PEG tube placement on 5/13, pulled out the tube on 5/25 and eventually required ex lap placement.  After that situation, he developed progressive AKI (baseline creatinine 1.6), culminating in initiation of HD.  He has had waxing and waning mental status during this prolonged hospitalization; neurology has consulted and attributes this to metabolic encephalopathy related to his multitude of medical problems.  ID consulted; he has completed antibiotics and is not recommended for ongoing therapy unless new issue arises.  Urology consulted for consideration of B nephrostomy tubes but this was deferred.  He has a rectal tube with diarrhea that is thought to be related to tube feeds; dietician is still consulting.  He is now medically stable and awaiting placement in LTC SNF, as he does not appear to be physically  capable of progressing appropriately in STR.  Assessment and Plan:  Generalized weakness/Disposition Therapy as tolerated He has been declined by [facility] and does not appear to be a current candidate, unfortunately He also does not appear to be an appropriate STR candidate; he is minimally able to participate in therapy and given the prolonged nature of his hospitalization with recurrent setbacks, he is unlikely to demonstrate meaningful recovery in STR PT evaluated on 7/18, a non-HD day, and they have changed the recommendation to LTC As such, LTC is the recommendation for him at this time Family continues to believe that full scope of care is most appropriate and has declined hospice; palliative care has signed off He is medically ready for discharge at this time, as there are no further studies or consults planned unless there are medical changes Discussed with his granddaughter; will do surveillance MWF labs for now x 3 He is now awaiting placement in a LTC facility and so family meeting is scheduled Monday, 7/21, to discuss options for long term placement   ESRD Initially with AKI on CKD Developed worsening renal failure with hyperkalemia, hyponatremia, and metabolic acidosis Started on HD with improvement (other than encephalopathy - see below) Consideration of ureteral stents but as of 7/14 this is no longer the case; B nephrostomy tubes could be considered in the future if there was concern about persisting infection from a urological source - but this is not currently considered likely He will need lifelong HD unless family decides on an alternative trajectory (hospice) Nephrology is planning to extend his HD time to try to help clear the BUN; nutrition  reports that changing from high protein feeds did not improve BUN but did progress his muscle wasting and so he was changed back   Encephalopathy S/p cardiac arrest during prior hospitalization (extensive chart review shows no evidence  of cardiac arrest - only respiratory - during this hospitalization Mental status appeared to be improving but recently has deteriorated Neurology consulted and has signed off  NH4 is minimally elevated and so is unlikely to be the cause AND he appears unlikely to tolerate lactulose  given his loose BMs; additionally, the NH4 would be expected to be cleared with HD Possibly uremic encephalopathy, as his BUN has not effectively cleared despite HD He may have long-term improvement from this standpoint per neurology - but only if he has further medical and physical recovery and so this is uncertain Neurology has signed off As noted above - increasing duration of dialysis to see if this helps to clear mentation (by way of BUN); this will be primarily followed clinically rather than with labs   Ileus Tube feeds were held but have been restarted He has been stooling for several days and has rectal tube with significant output He is unlikely to tolerate lactulose  His granddaughter is hopeful that the diarrhea is associated with antibiotics and prior constipation He is on probiotics GI, surgery have signed off   Achlasia/dysphagia PEG tube was placed, getting tube feeds S/p Botox  in the LES on 5/20 Underwent EGD on 6/10 and GI has signed off High risk for aspiration should he be offered PO foods in the future Consider outpatient referral to tertiary advanced GI for consideration of POEMS procedure - would recommend this only after a few weeks recovery period   GERD (gastroesophageal reflux disease) Continue PPI    Essential hypertension, benign BP normal off medications   Nutrition Status: Nutrition Problem: Moderate Malnutrition Etiology: acute illness Signs/Symptoms: mild fat depletion, mild muscle depletion, moderate muscle depletion, percent weight loss Interventions: Tube feeding   Goals of care  Previously, Son [name] was to be the primary point of contact and [name] confirmed that the  patient would want CPR and life support, if needed Subsequently, his granddaughter [name] (a physical therapist in [place]) became the designated HCPOA [name] is adamant that he would want full supportive and resuscitative measures and would not want hospice at this time Palliative care has signed off Will attempt to have all family meetings (telephone only, as family is out of town and unable to be physically present) as multidisciplinary meetings with all available service lines who are actively involved in his care (currently only leadership, TOC team, hospitalist, nephrology, and dietician) His next meeting is scheduled for Monday, 7/21    Severe sepsis due to aspiration PNA with acute hypoxic respiratory failure - resolved  Initially w/ aspiration pna Later in hospital stay w/ intra-abdominal infection secondary to PEG tube not being in the right place Most recently with unexplained leukocytosis with negative blood cultures and urine culture with 60k colonies of Enterococcus He has completed antibiotics, most recently with Zosyn  Leukocytosis is improving  He is no longer being treated with antibiotics and is medically stable from this standpoint; ID has signed off He has an overall poor prognosis, however, and is at ongoing risk of infection including aspiration   Class 1 obesity Body mass index is 30.62 kg/m.SABRA  Weight loss should be encouraged Outpatient PCP/bariatric medicine f/u encouraged Significantly low or high BMI is associated with higher medical risk including morbidity and mortality  Consultants: PCCM GI Nephrology Vascular surgery Surgery Urology (telephone only) Neurology Palliative care SLP OT PT [facility] team Dietician   Procedures: Bronchoscopy 5/11 PEG tube placement 5/13, 5/2, 5/27 EGD with botox  injection into LES 5/20 Ex lap 5/27 EGD 6/10 TDC placement 6/16 HD 6/16- Perma catheter insertion 6/26   Antibiotics: Unasyn  6/16-20 Azithromycin   x 1 Cefepime  6/30-7/3 Ceftriaxone  5/10-14 Metronidazole  5/10, 6/30-7/3 Zosyn  6/11, 5/26-31, 7/3-15 Vancomycin  6/30-7/5  30 Day Unplanned Readmission Risk Score    Flowsheet Row ED to Hosp-Admission (Current) from 10/05/2023 in [facility]  30 Day Unplanned Readmission Risk Score (%) 24.84 Filed at 12/14/2023 0800    This score is the patient's risk of an unplanned readmission within 30 days of being discharged (0 -100%). The score is based on dignosis, age, lab data, medications, orders, and past utilization.   Low:  0-14.9   Medium: 15-21.9   High: 22-29.9   Extreme: 30 and above           Subjective: Stable, unchanged.  Seen in dialysis.  He is requiring total care.  Eyes open and tracking to some extent with almost no attempt at interaction.  Briefly attempted to answer 1 question with unintelligible speech.   Objective: Vitals:   12/14/23 1259 12/14/23 1400  BP: 119/63 120/62  Pulse: 87 82  Resp: (!) 21 20  Temp:  98 F (36.7 C)  SpO2: 100% 100%    Intake/Output Summary (Last 24 hours) at 12/14/2023 1417 Last data filed at 12/14/2023 1349 Gross per 24 hour  Intake 1677 ml  Output 1400 ml  Net 277 ml   Filed Weights   12/12/23 0838 12/12/23 1200  Weight: 95.1 kg 94.1 kg    Exam:  General:  Appears chronically ill, answers limited questions with slurred/unintelligible speech Eyes:   normal lids, iris; tracked better today ENT:  grossly normal hearing, lips & tongue, mmm Cardiovascular:  RRR. No LE edema.  Respiratory:   CTA bilaterally with no wheezes/rales/rhonchi.  Mildly increased respiratory effort during HD. Abdomen:  soft, NT, ND Skin:  no rash or induration seen on limited exam Musculoskeletal:  generalized weakness Psychiatric:  lethargic mood and affect, speech sparse and unintelligible Neurologic: unable to effectively perform  Data Reviewed: I have reviewed the patient's lab results since admission.  Pertinent  labs for today include:   None today (Subsequently ordered by nephrology: Stable BMP, albumin  1.9, stable CBC)     Family Communication: None present; I spoke with [name] by telephone with [facility] multidisciplinary attendance for 15 minutes today and will plan to speak with her again on Monday during the family meeting  Disposition: Status is: Inpatient Remains inpatient appropriate because: awaiting disposition     Time spent: 50 minutes  Unresulted Labs (From admission, onward)     Start     Ordered   12/16/23 0500  CBC  Every Mon-Wed-Fri,   R     Question:  Specimen collection method  Answer:  Lab=Lab collect   12/14/23 1417   12/16/23 0500  Basic metabolic panel  Every Mon-Wed-Fri,   R     Question:  Specimen collection method  Answer:  Lab=Lab collect   12/14/23 1417             Author: Delon Herald, MD 12/14/2023 2:17 PM  For on call review [email or web address].

## 2023-12-14 NOTE — Progress Notes (Signed)
 Hemodialysis Note:  Received patient in bed to unit. Asleep. Informed consent singed and in chart.  Treatment initiated: 0947 Treatment completed: 1252  Access used: Right Subclavian catheter Access issues: None  Patient tolerated well. Transported back to room, alert without acute distress. Report given to patient's RN.  Total UF removed: 1 Liter Medications given: Retacrit  10000 units IV  Post HD weight: unable to get weight due to weakness  [facility]

## 2023-12-14 NOTE — Progress Notes (Signed)
 [facility]  PROGRESS NOTE   Subjective:   Patient seen in dialysis.  Lethargic but arousable.  Tolerating dialysis treatment well via permacath  Objective:  Vital signs: Blood pressure 116/63, pulse 76, temperature 98.2 F (36.8 C), resp. rate (!) 27, height 5' 9.02 (1.753 m), weight 94.1 kg, SpO2 98%.  Intake/Output Summary (Last 24 hours) at 12/14/2023 1003 Last data filed at 12/14/2023 0758 Gross per 24 hour  Intake 1320 ml  Output 400 ml  Net 920 ml   Filed Weights   12/02/23 1726 12/12/23 0838 12/12/23 1200  Weight: 94.3 kg 95.1 kg 94.1 kg     Physical Exam: General:  No acute distress  Head:  Normocephalic, atraumatic. Moist oral mucosal membranes  Eyes:  Anicteric  Neck:  Supple  Lungs:   Clear to auscultation, normal effort  Heart:  S1S2 no rubs  Abdomen:   Soft, nontender, bowel sounds present  Extremities:  peripheral edema.  Neurologic:  Awake, alert, following commands  Skin:  No lesions  Access:     Basic Metabolic Panel: Recent Labs  Lab 12/09/23 0553 12/10/23 0334 12/11/23 0421 12/12/23 0505 12/13/23 0517  NA 134* 134* 133* 133* 134*  K 4.1 3.8 4.0 4.1 4.0  CL 96* 95* 95* 95* 96*  CO2 23 28 24 24 27   GLUCOSE 95 103* 109* 114* 112*  BUN 86* 49* 78* 92* 51*  CREATININE 7.16* 4.46* 5.88* 7.28* 4.65*  CALCIUM  7.9* 8.1* 8.1* 8.3* 8.1*   GFR: Estimated Creatinine Clearance: 13.4 mL/min (A) (by C-G formula based on SCr of 4.65 mg/dL (H)).  Liver Function Tests: Recent Labs  Lab 12/10/23 0334  AST 24  ALT 17  ALKPHOS 76  BILITOT 0.4  PROT 7.7  ALBUMIN  2.1*   No results for input(s): LIPASE, AMYLASE in the last 168 hours. Recent Labs  Lab 12/10/23 2234  AMMONIA 54*    CBC: Recent Labs  Lab 12/10/23 0334 12/10/23 2113 12/11/23 0421 12/12/23 0505 12/13/23 0517 12/14/23 0945  WBC 15.0*  --  13.1* 13.1* 14.5* 13.3*  NEUTROABS  --   --   --  10.1* 11.0*  --   HGB 7.0* 7.9* 8.2* 8.0* 8.1* 8.3*  HCT 22.9* 25.5*  26.4* 25.2* 25.3* 26.6*  MCV 91.6  --  90.7 92.0 91.3 92.7  PLT 374  --  380 358 347 376     HbA1C: Hgb A1c MFr Bld  Date/Time Value Ref Range Status  10/10/2023 03:43 AM 5.5 4.8 - 5.6 % Final    Comment:    (NOTE) Pre diabetes:          5.7%-6.4%  Diabetes:              >6.4%  Glycemic control for   <7.0% adults with diabetes     Urinalysis: No results for input(s): COLORURINE, LABSPEC, PHURINE, GLUCOSEU, HGBUR, BILIRUBINUR, KETONESUR, PROTEINUR, UROBILINOGEN, NITRITE, LEUKOCYTESUR in the last 72 hours.  Invalid input(s): APPERANCEUR    Imaging: No results found.   Medications:    albumin  human 25 g (12/09/23 1054)    sodium chloride    Intravenous Once   Chlorhexidine  Gluconate Cloth  6 each Topical Q0600   epoetin  alfa-epbx (RETACRIT ) injection  10,000 Units Intravenous Q T,Th,Sat-1800   feeding supplement (NEPRO CARB STEADY)  237 mL Per Tube 5 X Daily   feeding supplement (PROSource TF20)  60 mL Per Tube Daily   ferrous sulfate   325 mg Per Tube Daily   free water   120 mL  Per Tube 5 X Daily   gentamicin  ointment   Topical TID   heparin  injection (subcutaneous)  5,000 Units Subcutaneous Q8H   insulin  aspart  10 Units Subcutaneous Once   midodrine   5 mg Per Tube TID WC   multivitamin  1 tablet Per Tube QHS   pantoprazole  (PROTONIX ) IV  40 mg Intravenous Q12H   saccharomyces boulardii  250 mg Per Tube BID    Assessment/ Plan:     83 year old white male with history of hypertension, coronary artery disease, congestive heart failure, obstructive sleep apnea,, GERD now admitted for acute respiratory failure and also acute kidney injury on chronic kidney disease.  #1: End-stage renal disease: Patient will be dialyzed today with 1 L fluid removal as tolerated.  Patient needs continuous monitoring for possible renal recovery.  He is now awaiting discharge to rehab facility.  #2: Anemia: Will continue Epogen  and iron.  #3: Sepsis: Patient  completed antibiotic treatment at this time.  #4: Bilateral hydronephrosis: Urology note appreciated no intervention planned at this time.  #5: Ileus/achalasia/dysphagia: Patient is presently on tube feeds.  Continue the free water   Labs and medications reviewed. Will continue to follow along with you.   LOS: 70 Pinkey Edman, MD [facility] 7/19/202510:03 AM

## 2023-12-15 DIAGNOSIS — R6520 Severe sepsis without septic shock: Secondary | ICD-10-CM | POA: Diagnosis not present

## 2023-12-15 DIAGNOSIS — G9340 Encephalopathy, unspecified: Secondary | ICD-10-CM | POA: Diagnosis not present

## 2023-12-15 DIAGNOSIS — D72829 Elevated white blood cell count, unspecified: Secondary | ICD-10-CM | POA: Diagnosis not present

## 2023-12-15 DIAGNOSIS — A419 Sepsis, unspecified organism: Secondary | ICD-10-CM | POA: Diagnosis not present

## 2023-12-15 LAB — GLUCOSE, CAPILLARY
Glucose-Capillary: 114 mg/dL — ABNORMAL HIGH (ref 70–99)
Glucose-Capillary: 125 mg/dL — ABNORMAL HIGH (ref 70–99)
Glucose-Capillary: 140 mg/dL — ABNORMAL HIGH (ref 70–99)

## 2023-12-15 NOTE — Progress Notes (Signed)
 Mobility Specialist - Progress Note   12/15/23 1128  Mobility  Activity Transferred from bed to chair  Level of Assistance +2 (takes two people)  Assistive Device  Water quality scientist)  Activity Response Tolerated well  Mobility visit 1 Mobility  Mobility Specialist Start Time (ACUTE ONLY) 1101  Mobility Specialist Stop Time (ACUTE ONLY) 1126  Mobility Specialist Time Calculation (min) (ACUTE ONLY) 25 min   Pt semi fowler upon entry, utilizing RA. Pt rolls L/R while MS and NT placed hoyer sling MaxA. Pt transferred to the recliner via [facility] lift +2 for safety. Pt left seated in the recliner with alarm set and needs within reach.   [name] Mobility Specialist 12/15/23 11:36 AM

## 2023-12-15 NOTE — Progress Notes (Signed)
 [facility]  PROGRESS NOTE   Subjective:   Lethargic but arousable.  Objective:  Vital signs: Blood pressure (!) (P) 147/73, pulse 82, temperature (!) 97.4 F (36.3 C), temperature source (P) Axillary, resp. rate 18, height 5' 9.02 (1.753 m), weight 94.1 kg, SpO2 96%.  Intake/Output Summary (Last 24 hours) at 12/15/2023 1430 Last data filed at 12/15/2023 1200 Gross per 24 hour  Intake 600 ml  Output --  Net 600 ml   Filed Weights   12/12/23 0838 12/12/23 1200  Weight: 95.1 kg 94.1 kg     Physical Exam: General:  No acute distress  Head:  Normocephalic, atraumatic. Moist oral mucosal membranes  Eyes:  Anicteric  Neck:  Supple  Lungs:   Clear to auscultation, normal effort  Heart:  S1S2 no rubs  Abdomen:   Soft, nontender, bowel sounds present  Extremities:  peripheral edema.  Neurologic:  Awake, alert, following commands  Skin:  No lesions  Access:     Basic Metabolic Panel: Recent Labs  Lab 12/10/23 0334 12/11/23 0421 12/12/23 0505 12/13/23 0517 12/14/23 0945  NA 134* 133* 133* 134* 133*  K 3.8 4.0 4.1 4.0 3.9  CL 95* 95* 95* 96* 94*  CO2 28 24 24 27 27   GLUCOSE 103* 109* 114* 112* 128*  BUN 49* 78* 92* 51* 85*  CREATININE 4.46* 5.88* 7.28* 4.65* 6.36*  CALCIUM  8.1* 8.1* 8.3* 8.1* 8.1*  PHOS  --   --   --   --  5.7*   GFR: Estimated Creatinine Clearance: 9.8 mL/min (A) (by C-G formula based on SCr of 6.36 mg/dL (H)).  Liver Function Tests: Recent Labs  Lab 12/10/23 0334 12/14/23 0945  AST 24  --   ALT 17  --   ALKPHOS 76  --   BILITOT 0.4  --   PROT 7.7  --   ALBUMIN  2.1* 1.9*   No results for input(s): LIPASE, AMYLASE in the last 168 hours. Recent Labs  Lab 12/10/23 2234  AMMONIA 54*    CBC: Recent Labs  Lab 12/10/23 0334 12/10/23 2113 12/11/23 0421 12/12/23 0505 12/13/23 0517 12/14/23 0945  WBC 15.0*  --  13.1* 13.1* 14.5* 13.3*  NEUTROABS  --   --   --  10.1* 11.0*  --   HGB 7.0* 7.9* 8.2* 8.0* 8.1* 8.3*   HCT 22.9* 25.5* 26.4* 25.2* 25.3* 26.6*  MCV 91.6  --  90.7 92.0 91.3 92.7  PLT 374  --  380 358 347 376     HbA1C: Hgb A1c MFr Bld  Date/Time Value Ref Range Status  10/10/2023 03:43 AM 5.5 4.8 - 5.6 % Final    Comment:    (NOTE) Pre diabetes:          5.7%-6.4%  Diabetes:              >6.4%  Glycemic control for   <7.0% adults with diabetes     Urinalysis: No results for input(s): COLORURINE, LABSPEC, PHURINE, GLUCOSEU, HGBUR, BILIRUBINUR, KETONESUR, PROTEINUR, UROBILINOGEN, NITRITE, LEUKOCYTESUR in the last 72 hours.  Invalid input(s): APPERANCEUR    Imaging: No results found.   Medications:    albumin  human 25 g (12/09/23 1054)    sodium chloride    Intravenous Once   Chlorhexidine  Gluconate Cloth  6 each Topical Q0600   epoetin  alfa-epbx (RETACRIT ) injection  10,000 Units Intravenous Q T,Th,Sat-1800   feeding supplement (NEPRO CARB STEADY)  237 mL Per Tube 5 X Daily   feeding supplement (PROSource TF20)  60 mL Per Tube Daily   ferrous sulfate   325 mg Per Tube Daily   free water   120 mL Per Tube 5 X Daily   gentamicin  ointment   Topical TID   heparin  injection (subcutaneous)  5,000 Units Subcutaneous Q8H   insulin  aspart  10 Units Subcutaneous Once   midodrine   5 mg Per Tube TID WC   multivitamin  1 tablet Per Tube QHS   pantoprazole  (PROTONIX ) IV  40 mg Intravenous Q12H   saccharomyces boulardii  250 mg Per Tube BID    Assessment/ Plan:     83 year old white male with history of hypertension, coronary artery disease, congestive heart failure, obstructive sleep apnea,, GERD now admitted for acute respiratory failure and also acute kidney injury on chronic kidney disease.   #1: End-stage renal disease: Patient was dialyzed today with 1 L fluid removal yesterday.  Patient needs continuous monitoring for possible renal recovery.  He is now awaiting discharge to rehab facility.   #2: Anemia: Will continue Epogen  and iron.   #3:  Sepsis: Patient completed antibiotic treatment at this time.   #4: Bilateral hydronephrosis: Urology note appreciated no intervention planned at this time.   #5: Ileus/achalasia/dysphagia: Patient is presently on tube feeds.  Continue the free water   Labs and medications reviewed. Will continue to follow along with you.   LOS: 71 Pinkey Edman, MD [facility] 7/20/20252:30 PM

## 2023-12-15 NOTE — Progress Notes (Signed)
 Progress Note   Patient: Robert Vega FMW:[id number] DOB: 28-Nov-1940 DOA: 10/05/2023     71 DOS: the patient was seen and examined on 12/15/2023   Brief hospital course: 83 yo with h/o OSA, GERD, HTN, class 1 obesity, and dysphagia who presented on 5/10 with fever and hypoxia.  He was diagnosed with sepsis due to PNA with possible aspiration and effectively treated with antibiotics.  He had an aspiration event that led to respiratory arrest on 5/10 (no CPR provided during this hospitalization on extensive chart review) and was transferred to the ICU intubated.  (He also had prior EGD in 03/2022 with food in upper esophagus complicated by aspiration event, cardiac arrest with round of CPR, and post resuscitation EGD with concern for lack of peristalsis. Hx prior EGD 08/2020 with note of abnormal cricopharyngeus, decrease in motility in esophagus, and spastic LES.)  He was extubated on 5/14 but had severe delirium.  He underwent botox  injection into the LES on 5/20.  5/21 esophagram with diffusely distended esophagus with distal obstruction and severe dysmotility suggestive of achalasia. He underwent PEG tube placement on 5/13, pulled out the tube on 5/25 and eventually required ex lap placement.  After that situation, he developed progressive AKI (baseline creatinine 1.6), culminating in initiation of HD.  He has had waxing and waning mental status during this prolonged hospitalization; neurology has consulted and attributes this to metabolic encephalopathy related to his multitude of medical problems.  ID consulted; he has completed antibiotics and is not recommended for ongoing therapy unless new issue arises.  Urology consulted for consideration of B nephrostomy tubes but this was deferred.  He has a rectal tube with diarrhea that is thought to be related to tube feeds; dietician is still consulting.  He is now medically stable and awaiting placement in LTC SNF, as he does not appear to be physically  capable of progressing appropriately in STR.  Assessment and Plan:  Generalized weakness/Disposition Therapy as tolerated Now recommended for PTC  Family continues to believe that full scope of care is most appropriate and has declined hospice; palliative care has signed off He is medically ready for discharge at this time, as there are no further studies or consults planned unless there are medical changes Discussed with his granddaughter; will do surveillance MWF labs for now x 3 He is now awaiting placement in a LTC facility and a family meeting is scheduled Monday, 7/21, to discuss options for long term placement   ESRD Initially with AKI on CKD Developed worsening renal failure with hyperkalemia, hyponatremia, and metabolic acidosis Started on HD with improvement (other than encephalopathy - see below) He will need lifelong HD unless family decides on an alternative trajectory (hospice) Nephrology is planning to extend his HD time to try to help clear the BUN; nutrition reports that changing from high protein feeds did not improve BUN but did progress his muscle wasting and so he was changed back   Encephalopathy S/p cardiac arrest during prior hospitalization (extensive chart review shows no evidence of cardiac arrest - only respiratory - during this hospitalization) Neurology consulted and has signed off  Possibly uremic encephalopathy, as his BUN has not effectively cleared despite HD - attempting to increase duration of HD to see if this helps to clear mentation (by way of BUN); this will be primarily followed clinically rather than with labs He may have long-term improvement from this standpoint per neurology - but only if he has further medical and physical recovery and  so this is uncertain Neurology has signed off   Achlasia/dysphagia PEG tube was placed, getting tube feeds S/p Botox  in the LES on 5/20 Underwent EGD on 6/10 High risk for aspiration should he be offered PO  foods in the future Consider outpatient referral to tertiary advanced GI for consideration of POEMS procedure - would recommend this only after a few weeks recovery period Has rectal tube in place for diarrhea associated with tube feeds He is on probiotics GI, surgery have signed off  GERD (gastroesophageal reflux disease) Continue PPI    Essential hypertension, benign BP normal off medications   Nutrition Status: Nutrition Problem: Moderate Malnutrition Etiology: acute illness Signs/Symptoms: mild fat depletion, mild muscle depletion, moderate muscle depletion, percent weight loss Interventions: Tube feeding   Goals of care  Previously, Son [name] was to be the primary point of contact  Subsequently, his granddaughter [name] (a physical therapist in [place]) became the designated HCPOA [name] is adamant that he would want full supportive and resuscitative measures and would not want hospice at this time Palliative care has signed off Will attempt to have all family meetings (telephone only, as family is out of town and unable to be physically present) as multidisciplinary meetings with all available service lines who are actively involved in his care (currently only leadership, TOC team, hospitalist, nephrology, and dietician) His next meeting is scheduled for Monday, 7/21    Severe sepsis due to aspiration PNA with acute hypoxic respiratory failure - resolved  Initially w/ aspiration pna Later in hospital stay w/ intra-abdominal infection secondary to PEG tube not being in the right place Most recently with unexplained leukocytosis with negative blood cultures and urine culture with 60k colonies of Enterococcus He has completed antibiotics, most recently with Zosyn  Leukocytosis is improving  He is no longer being treated with antibiotics and is medically stable from this standpoint; ID has signed off He has an overall poor prognosis, however, and is at ongoing risk of infection  including aspiration   Class 1 obesity Body mass index is 30.62 kg/m.SABRA  Weight loss should be encouraged Outpatient PCP/bariatric medicine f/u encouraged Significantly low or high BMI is associated with higher medical risk including morbidity and mortality           Consultants: PCCM GI Nephrology Vascular surgery Surgery Urology (telephone only) Neurology Palliative care SLP OT PT [facility] team Dietician   Procedures: Bronchoscopy 5/11 PEG tube placement 5/13, 5/2, 5/27 EGD with botox  injection into LES 5/20 Ex lap 5/27 EGD 6/10 TDC placement 6/16 HD 6/16- Perma catheter insertion 6/26   Antibiotics: Unasyn  6/16-20 Azithromycin  x 1 Cefepime  6/30-7/3 Ceftriaxone  5/10-14 Metronidazole  5/10, 6/30-7/3 Zosyn  6/11, 5/26-31, 7/3-15 Vancomycin  6/30-7/5  30 Day Unplanned Readmission Risk Score    Flowsheet Row ED to Hosp-Admission (Current) from 10/05/2023 in [facility]  30 Day Unplanned Readmission Risk Score (%) 26.92 Filed at 12/15/2023 0801    This score is the patient's risk of an unplanned readmission within 30 days of being discharged (0 -100%). The score is based on dignosis, age, lab data, medications, orders, and past utilization.   Low:  0-14.9   Medium: 15-21.9   High: 22-29.9   Extreme: 30 and above           Subjective: Seen in his room today while preparing to move OOB to chair via [employer].  Not oriented even to person but he did appear to say Im' not really sure about his name which seems  to be more clear than prior.  On re-attempt, speech was nonsensical.   Objective: Vitals:   12/14/23 2008 12/15/23 0405  BP: (!) 141/72 (!) (P) 147/73  Pulse: 84 82  Resp: 17 18  Temp: 98.4 F (36.9 C) (!) 97.4 F (36.3 C)  SpO2: 97% 96%    Intake/Output Summary (Last 24 hours) at 12/15/2023 0902 Last data filed at 12/15/2023 0504 Gross per 24 hour  Intake 480 ml  Output 1000 ml  Net -520 ml   Filed Weights    12/12/23 0838 12/12/23 1200  Weight: 95.1 kg 94.1 kg    Exam:  General:  Appears chronically ill, answers limited questions with slurred/unintelligible speech Eyes:   normal lids, iris ENT:  grossly normal hearing, lips & tongue, mmm Cardiovascular:  RRR. No LE edema.  Respiratory:   CTA bilaterally with no wheezes/rales/rhonchi.  Normal respiratory effort Abdomen:  soft, NT, ND Skin:  no rash or induration seen on limited exam Musculoskeletal:  generalized weakness Psychiatric:  lethargic mood and affect, speech sparse and unintelligible Neurologic: unable to effectively perform  Data Reviewed: I have reviewed the patient's lab results since admission.  Pertinent labs for today include:   None today     Family Communication: None present; family meeting scheduled for tomorrow  Disposition: Status is: Inpatient Remains inpatient appropriate because: awaiting disposition     Time spent: 35 minutes  Unresulted Labs (From admission, onward)     Start     Ordered   12/16/23 0500  CBC  Every Mon-Wed-Fri,   TIMED     Question:  Specimen collection method  Answer:  Lab=Lab collect   12/14/23 1417   12/16/23 0500  Basic metabolic panel  Every Mon-Wed-Fri,   TIMED     Question:  Specimen collection method  Answer:  Lab=Lab collect   12/14/23 1417             Author: Delon Herald, MD 12/15/2023 9:02 AM  For on call review [email or web address].

## 2023-12-16 DIAGNOSIS — K5939 Other megacolon: Secondary | ICD-10-CM | POA: Diagnosis not present

## 2023-12-16 DIAGNOSIS — R392 Extrarenal uremia: Secondary | ICD-10-CM | POA: Diagnosis not present

## 2023-12-16 DIAGNOSIS — I120 Hypertensive chronic kidney disease with stage 5 chronic kidney disease or end stage renal disease: Secondary | ICD-10-CM | POA: Diagnosis not present

## 2023-12-16 DIAGNOSIS — Z931 Gastrostomy status: Secondary | ICD-10-CM | POA: Diagnosis not present

## 2023-12-16 DIAGNOSIS — N329 Bladder disorder, unspecified: Secondary | ICD-10-CM | POA: Diagnosis not present

## 2023-12-16 DIAGNOSIS — Z992 Dependence on renal dialysis: Secondary | ICD-10-CM | POA: Diagnosis not present

## 2023-12-16 DIAGNOSIS — I517 Cardiomegaly: Secondary | ICD-10-CM | POA: Diagnosis not present

## 2023-12-16 DIAGNOSIS — N186 End stage renal disease: Secondary | ICD-10-CM | POA: Diagnosis not present

## 2023-12-16 DIAGNOSIS — N3289 Other specified disorders of bladder: Secondary | ICD-10-CM | POA: Diagnosis not present

## 2023-12-16 DIAGNOSIS — J0130 Acute sphenoidal sinusitis, unspecified: Secondary | ICD-10-CM | POA: Diagnosis not present

## 2023-12-16 DIAGNOSIS — N323 Diverticulum of bladder: Secondary | ICD-10-CM | POA: Diagnosis not present

## 2023-12-16 DIAGNOSIS — R918 Other nonspecific abnormal finding of lung field: Secondary | ICD-10-CM | POA: Diagnosis not present

## 2023-12-16 DIAGNOSIS — J90 Pleural effusion, not elsewhere classified: Secondary | ICD-10-CM | POA: Diagnosis not present

## 2023-12-16 DIAGNOSIS — D631 Anemia in chronic kidney disease: Secondary | ICD-10-CM | POA: Diagnosis not present

## 2023-12-16 DIAGNOSIS — R489 Unspecified symbolic dysfunctions: Secondary | ICD-10-CM | POA: Diagnosis not present

## 2023-12-16 DIAGNOSIS — A419 Sepsis, unspecified organism: Secondary | ICD-10-CM | POA: Diagnosis not present

## 2023-12-16 DIAGNOSIS — H61303 Acquired stenosis of external ear canal, unspecified, bilateral: Secondary | ICD-10-CM | POA: Diagnosis not present

## 2023-12-16 DIAGNOSIS — R931 Abnormal findings on diagnostic imaging of heart and coronary circulation: Secondary | ICD-10-CM | POA: Diagnosis not present

## 2023-12-16 DIAGNOSIS — D649 Anemia, unspecified: Secondary | ICD-10-CM | POA: Diagnosis not present

## 2023-12-16 DIAGNOSIS — R1310 Dysphagia, unspecified: Secondary | ICD-10-CM | POA: Diagnosis not present

## 2023-12-16 DIAGNOSIS — R10813 Right lower quadrant abdominal tenderness: Secondary | ICD-10-CM | POA: Diagnosis not present

## 2023-12-16 DIAGNOSIS — N2889 Other specified disorders of kidney and ureter: Secondary | ICD-10-CM | POA: Diagnosis not present

## 2023-12-16 DIAGNOSIS — G9340 Encephalopathy, unspecified: Secondary | ICD-10-CM | POA: Diagnosis not present

## 2023-12-16 DIAGNOSIS — G9341 Metabolic encephalopathy: Secondary | ICD-10-CM | POA: Diagnosis not present

## 2023-12-16 DIAGNOSIS — N1330 Unspecified hydronephrosis: Secondary | ICD-10-CM | POA: Diagnosis not present

## 2023-12-16 DIAGNOSIS — Z87440 Personal history of urinary (tract) infections: Secondary | ICD-10-CM | POA: Diagnosis not present

## 2023-12-16 DIAGNOSIS — F05 Delirium due to known physiological condition: Secondary | ICD-10-CM | POA: Diagnosis not present

## 2023-12-16 DIAGNOSIS — R10814 Left lower quadrant abdominal tenderness: Secondary | ICD-10-CM | POA: Diagnosis not present

## 2023-12-16 DIAGNOSIS — R310 Gross hematuria: Secondary | ICD-10-CM | POA: Diagnosis not present

## 2023-12-16 DIAGNOSIS — Z59811 Housing instability, housed, with risk of homelessness: Secondary | ICD-10-CM | POA: Diagnosis not present

## 2023-12-16 DIAGNOSIS — R319 Hematuria, unspecified: Secondary | ICD-10-CM | POA: Diagnosis not present

## 2023-12-16 DIAGNOSIS — R6520 Severe sepsis without septic shock: Secondary | ICD-10-CM | POA: Diagnosis not present

## 2023-12-16 DIAGNOSIS — K220 Achalasia of cardia: Secondary | ICD-10-CM | POA: Diagnosis not present

## 2023-12-16 DIAGNOSIS — N368 Other specified disorders of urethra: Secondary | ICD-10-CM | POA: Diagnosis not present

## 2023-12-16 DIAGNOSIS — I82613 Acute embolism and thrombosis of superficial veins of upper extremity, bilateral: Secondary | ICD-10-CM | POA: Diagnosis not present

## 2023-12-16 DIAGNOSIS — E46 Unspecified protein-calorie malnutrition: Secondary | ICD-10-CM | POA: Diagnosis not present

## 2023-12-16 DIAGNOSIS — D62 Acute posthemorrhagic anemia: Secondary | ICD-10-CM | POA: Diagnosis not present

## 2023-12-16 DIAGNOSIS — C679 Malignant neoplasm of bladder, unspecified: Secondary | ICD-10-CM | POA: Diagnosis not present

## 2023-12-16 DIAGNOSIS — I10 Essential (primary) hypertension: Secondary | ICD-10-CM | POA: Diagnosis not present

## 2023-12-16 DIAGNOSIS — Z87891 Personal history of nicotine dependence: Secondary | ICD-10-CM | POA: Diagnosis not present

## 2023-12-16 DIAGNOSIS — J9602 Acute respiratory failure with hypercapnia: Secondary | ICD-10-CM | POA: Diagnosis not present

## 2023-12-16 DIAGNOSIS — C678 Malignant neoplasm of overlapping sites of bladder: Secondary | ICD-10-CM | POA: Diagnosis not present

## 2023-12-16 DIAGNOSIS — N139 Obstructive and reflux uropathy, unspecified: Secondary | ICD-10-CM | POA: Diagnosis not present

## 2023-12-16 DIAGNOSIS — Z4659 Encounter for fitting and adjustment of other gastrointestinal appliance and device: Secondary | ICD-10-CM | POA: Diagnosis not present

## 2023-12-16 DIAGNOSIS — N13721 Vesicoureteral-reflux with reflux nephropathy without hydroureter, unilateral: Secondary | ICD-10-CM | POA: Diagnosis not present

## 2023-12-16 DIAGNOSIS — Z6834 Body mass index (BMI) 34.0-34.9, adult: Secondary | ICD-10-CM | POA: Diagnosis not present

## 2023-12-16 DIAGNOSIS — N400 Enlarged prostate without lower urinary tract symptoms: Secondary | ICD-10-CM | POA: Diagnosis not present

## 2023-12-16 DIAGNOSIS — J9692 Respiratory failure, unspecified with hypercapnia: Secondary | ICD-10-CM | POA: Diagnosis not present

## 2023-12-16 DIAGNOSIS — D72829 Elevated white blood cell count, unspecified: Secondary | ICD-10-CM | POA: Diagnosis not present

## 2023-12-16 DIAGNOSIS — R4182 Altered mental status, unspecified: Secondary | ICD-10-CM | POA: Diagnosis not present

## 2023-12-16 DIAGNOSIS — G928 Other toxic encephalopathy: Secondary | ICD-10-CM | POA: Diagnosis not present

## 2023-12-16 DIAGNOSIS — N179 Acute kidney failure, unspecified: Secondary | ICD-10-CM | POA: Diagnosis not present

## 2023-12-16 LAB — BASIC METABOLIC PANEL WITH GFR
Anion gap: 15 (ref 5–15)
BUN: 90 mg/dL — ABNORMAL HIGH (ref 8–23)
CO2: 25 mmol/L (ref 22–32)
Calcium: 8.3 mg/dL — ABNORMAL LOW (ref 8.9–10.3)
Chloride: 92 mmol/L — ABNORMAL LOW (ref 98–111)
Creatinine, Ser: 5.82 mg/dL — ABNORMAL HIGH (ref 0.61–1.24)
GFR, Estimated: 9 mL/min — ABNORMAL LOW (ref >60)
Glucose, Bld: 105 mg/dL — ABNORMAL HIGH (ref 70–99)
Potassium: 4.1 mmol/L (ref 3.5–5.1)
Sodium: 132 mmol/L — ABNORMAL LOW (ref 135–145)

## 2023-12-16 LAB — CBC
HCT: 27.8 % — ABNORMAL LOW (ref 39.0–52.0)
Hemoglobin: 8.5 g/dL — ABNORMAL LOW (ref 13.0–17.0)
MCH: 28.1 pg (ref 26.0–34.0)
MCHC: 30.6 g/dL (ref 30.0–36.0)
MCV: 91.7 fL (ref 80.0–100.0)
Platelets: 373 K/uL (ref 150–400)
RBC: 3.03 MIL/uL — ABNORMAL LOW (ref 4.22–5.81)
RDW: 15.9 % — ABNORMAL HIGH (ref 11.5–15.5)
WBC: 10.6 K/uL — ABNORMAL HIGH (ref 4.0–10.5)
nRBC: 0 % (ref 0.0–0.2)

## 2023-12-16 LAB — GLUCOSE, CAPILLARY: Glucose-Capillary: 106 mg/dL — ABNORMAL HIGH (ref 70–99)

## 2023-12-16 NOTE — Progress Notes (Signed)
 Occupational Therapy Treatment Patient Details Name: Robert Vega MRN: [id number] DOB: [birth year] Today's Date: 12/16/2023   History of present illness 83 y.o male with significant PMH of OSA, GERD, Obesity, HTN, Dysphagia: EGD 03/2022 with food in upper esophagus complicated by aspiration event, cardiac arrest with round of CPR, and post resuscitation EGD with concern for lack of peristalsis. Pt presented to ED on 10/05/2023 with hypoxia, fever and generalized weakness; developed acute respiratory failure requiring intubation 5/10 due to aspiration pneumonia, extubated 5/15, but had significant agitation required brief course of Precedex ; pt now s/p exploratory laparotomy with closure of gastrotomy and insertion of gastrostomy tube on 10/22/23.  PT order discontinued by MD 11/10/23 and new PT consult received 11/14/23.  Pt s/p R temporary femoral vein dialysis catheter placement 11/11/23; now removed; pt now with HD perm cath.   OT comments  Robert Vega was seen for OT treatment on this date. Upon arrival to room pt in bed with eyes open and verbally responsive, agreeable to tx. Pt oriented to self only Robert Vega and states yes I might be when asked if in hospital, hotel, or home. TOTAL A x2 rolling L+R at bed level and sup<>sit. Noted increased stiffness/tightness to BLE, completed PROM supine and seated as described below to prevent contractures.   Tolerated ~10 min sitting EOB fluctuating MIN - MAX A for balance, increased assist as pt fatigues. Facilitated anterior weight shifting, noted improved righting responses. Pt continues to present with fluctuating mentation, this session noted improved participation: improved verbal responses (~50% appropriate to conversation), follows 1 step commands ~25% of session with visual/tactile cues, and noted purposeful movement (scratching head/bottom, reaching for rail) not on command. Left in bed with wedge positioned under R side, all need sin reach, RN aware.  Will continue to follow POC.       If plan is discharge home, recommend the following:  Two people to help with walking and/or transfers;Two people to help with bathing/dressing/bathroom   Equipment Recommendations  [place] lift;Hospital bed    Recommendations for Other Services      Precautions / Restrictions Precautions Precautions: Fall Recall of Precautions/Restrictions: Impaired Precaution/Restrictions Comments: PEG; perm cath (HD); rectal tube Restrictions Weight Bearing Restrictions Per Provider Order: No       Mobility Bed Mobility Overal bed mobility: Needs Assistance Bed Mobility: Rolling, Supine to Sit, Sit to Supine Rolling: Total assist, +2 for physical assistance   Supine to sit: Total assist, +2 for physical assistance, +2 for safety/equipment Sit to supine: Total assist, +2 for physical assistance, +2 for safety/equipment        Transfers                         Balance Overall balance assessment: Needs assistance Sitting-balance support: Bilateral upper extremity supported, Feet supported Sitting balance-Leahy Scale: Poor                                     ADL either performed or assessed with clinical judgement   ADL Overall ADL's : Needs assistance/impaired                                       General ADL Comments: TOTAL A x2 rolling bed level simulated toileting.    Extremity/Trunk Assessment     Lower  Extremity Assessment RLE Deficits / Details: global stiffness/tightness, achieves full PROM with grimacing noted. LLE Deficits / Details: global stiffness/tightness, achieves full PROM with grimacing noted.                 Communication Communication Communication: Impaired   Cognition Arousal: Lethargic Behavior During Therapy: Flat affect, WFL for tasks assessed/performed Cognition: Cognition impaired   Orientation impairments: Place, Time, Situation         OT - Cognition Comments:  oriented to self Robert Vega and states yes I might be when asked if in hospital, hotel, or home. Follows 1 step commands ~25% of session with visual/tactil cues                 Following commands: Impaired Following commands impaired: Follows one step commands inconsistently      Cueing   Cueing Techniques: Verbal cues, Gestural cues, Tactile cues  Exercises Exercises: General Lower Extremity General Exercises - Lower Extremity Ankle Circles/Pumps: PROM, Both, 5 reps, Supine, Seated Short Arc Quad: PROM, Both, 5 reps, Supine, Seated Long Arc Quad: PROM, Both, 5 reps, Seated    Shoulder Instructions       General Comments      Pertinent Vitals/ Pain       Pain Assessment Pain Assessment: PAINAD Breathing: occasional labored breathing, short period of hyperventilation Negative Vocalization: occasional moan/groan, low speech, negative/disapproving quality Facial Expression: smiling or inexpressive Body Language: relaxed Consolability: no need to console PAINAD Score: 2 Pain Location: BLE with PROM - noted global stiffness, worse in BLE Pain Descriptors / Indicators: Grimacing, Guarding Pain Intervention(s): Limited activity within patient's tolerance, Repositioned   Frequency  Min 1X/week        Progress Toward Goals  OT Goals(current goals can now be found in the care plan section)  Progress towards OT goals: Progressing toward goals  Acute Rehab OT Goals Time For Goal Achievement: 12/24/23 Potential to Achieve Goals: Fair ADL Goals Pt Will Perform Grooming: with supervision;sitting Pt Will Perform Lower Body Dressing: with mod assist;with min assist;sit to/from stand Pt Will Transfer to Toilet: with mod assist;bedside commode;stand pivot transfer Pt Will Perform Toileting - Clothing Manipulation and hygiene: with min assist;sit to/from stand  Plan      Co-evaluation                 AM-PAC OT 6 Clicks Daily Activity     Outcome Measure    Help from another person eating meals?: Total Help from another person taking care of personal grooming?: A Lot Help from another person toileting, which includes using toliet, bedpan, or urinal?: Total Help from another person bathing (including washing, rinsing, drying)?: A Lot Help from another person to put on and taking off regular upper body clothing?: A Lot Help from another person to put on and taking off regular lower body clothing?: Total 6 Click Score: 9    End of Session Equipment Utilized During Treatment: Oxygen  OT Visit Diagnosis: Muscle weakness (generalized) (M62.81)   Activity Tolerance Patient tolerated treatment well   Patient Left in bed;with call bell/phone within reach;with bed alarm set   Nurse Communication Mobility status        Time: 9050-8984 OT Time Calculation (min): 26 min  Charges: OT General Charges $OT Visit: 1 Visit OT Treatments $Therapeutic Activity: 8-22 mins $Therapeutic Exercise: 8-22 mins  Elston Slot, M.S. OTR/L  12/16/23, 11:10 AM  ascom [phone]

## 2023-12-16 NOTE — ED Provider Notes (Signed)
 Initial Provider Assessment  Interpreter used: Level of Interpreter Services: No interpreter needed (no language barrier) Historian: EMS personnel  HPI: Pt is a 83 y.o. male with history as listed below who presents to the ED with Dialysis - Needs  Patient was signed out AGAINST MEDICAL ADVICE from [facility] and transported to [facility] for further evaluation.  Per EMS crew, was inpatient at [facility] but they are not sure exactly what was going on or what his diagnosis is.  Reportedly he is nonverbal at baseline but unclear when this became his baseline.  Granddaughter is reportedly coming and his medical power of attorney.  No past medical history on file.  No past surgical history on file.   Vitals:   12/16/23 1219  BP: 123/76  BP Location: Right upper arm  Patient Position: Sitting  Pulse: 73  Resp: 26  TempSrc: Tympanic  SpO2: 99%   Focused Physical Exam: Gen: No Acute Distress HEENT: Normocephalic and atraumatic CV: Regular rate Lung: No respiratory distress Abd: Soft, non-tender, non-distended Neuro: Nonverbal, not following commands  Medical Decision Making and Plan: Given the patient's initial provider assessment, the following diagnostic evaluation and therapeutic interventions have been ordered. The patient will be placed in the appropriate treatment space, once one is available, to complete the evaluation and treatment.  I have discussed the plan of care with the patient. Prior notes reviewed: yes Tests ordered:  Orders Placed This Encounter  Procedures  . Culture, Blood  . Culture, Blood  . X-ray chest PA and lateral  . CT brain without contrast  . Shock Panel, Venous  . Comprehensive Metabolic Panel (CMP)  . Complete Blood Count (CBC) with Differential  . Prothrombin Time (INR)  . Activated Partial Thromboplastin Time (APTT)  . Lactate, Plasma (Lactic Acid) Venous  . Culture, Urine, Routine with Pyuria Screen (reflexed from Urinalysis)  . Pulse Oximetry   . ED Cardiac Monitoring  . Weight  . Hypoglycemia Protocol  . Droplet isolation status  . Contact isolation status  . Oxygen therapy per respiratory care / protocol  . POC GLUCOSE  . ECG 12-lead  . Peripheral IV Access, Obtain & Maintain- Adult   Treatments ordered:  Medications  lidocaine  (XYLOCAINE ) 1 % injection 0.5 mL (has no administration in time range)  dextrose  50% in water  solution 12.5-25 g (has no administration in time range)  glucagon (GLUCAGEN) injection 1 mg (has no administration in time range)      Teresa Morna Pickett, MD 12/16/23 1227

## 2023-12-16 NOTE — Discharge Summary (Signed)
 [facility] Discharge Summary   Patient: Robert Vega FMW:[id number]   PCP: Jimmy Charlie FERNS, MD DOB: Aug 23, 1940   Date of admission: 10/05/2023   Date of discharge: 12/16/2023     NOTE: DISCHARGE SUMMARY FOR PATIENT LEAVING AMA.  PATIENT REMAINS MEDICALLY STABLE BUT DOES NOT HAVE A SAFE DISPOSITION, AS HE WILL REQUIRE LONG-TERM CARE IN A NURSING FACILITY.  HIS GRANDDAUGHTER SHOWED UP UNANNOUNCED FROM [place]  TODAY WITH [facility] AND SIGNED HIM OUT AGAINST MEDICAL ADVISE TO TRANSPORT HIM TO AN ALTERNATIVE FACILITY.   Discharge Diagnoses:   Principal Problem:   Severe sepsis (HCC) Active Problems:   Gastrostomy complication (HCC)   Dysphagia   Achalasia of esophagus   AKI (acute kidney injury) (HCC)   Acute delirium   Acute respiratory failure with hypoxia and hypercapnia (HCC)   Essential hypertension, benign   GERD (gastroesophageal reflux disease)   Encounter for dialysis and dialysis catheter care [facility])   Multifocal pneumonia   History of dysphagia   Aspiration pneumonia (HCC)   Obesity (BMI 30-39.9)   Generalized weakness   Generalized abdominal pain   Malnutrition of moderate degree   Melena   Leukocytosis   Perinephric hematoma   Hydronephrosis   Encephalopathy   Discharge Condition: patient left AMA  Brief hospital course: 83 yo with h/o OSA, GERD, HTN, class 1 obesity, and dysphagia who presented on 5/10 with fever and hypoxia.  He was diagnosed with sepsis due to PNA with possible aspiration and effectively treated with antibiotics.  He had an aspiration event that led to respiratory arrest on 5/10 (no CPR provided during this hospitalization on extensive chart review) and was transferred to the ICU intubated.  (He also had prior EGD in 03/2022 with food in upper esophagus complicated by aspiration event, cardiac arrest with round of CPR, and post resuscitation EGD with concern for lack of peristalsis. Hx prior EGD 08/2020 with note of abnormal  cricopharyngeus, decrease in motility in esophagus, and spastic LES.)  He was extubated on 5/14 but had severe delirium.  He underwent botox  injection into the LES on 5/20.  5/21 esophagram with diffusely distended esophagus with distal obstruction and severe dysmotility suggestive of achalasia. He underwent PEG tube placement on 5/13, pulled out the tube on 5/25 and eventually required ex lap placement.  After that situation, he developed progressive AKI (baseline creatinine 1.6), culminating in initiation of HD.  He has had waxing and waning mental status during this prolonged hospitalization; neurology has consulted and attributes this to metabolic encephalopathy related to his multitude of medical problems.  ID consulted; he has completed antibiotics and is not recommended for ongoing therapy unless new issue arises.  Urology consulted for consideration of B nephrostomy tubes but this was deferred.  He has a rectal tube with diarrhea that is thought to be related to tube feeds; dietician is still consulting.  He is now medically stable and awaiting placement in LTC SNF, as he does not appear to be physically capable of progressing appropriately in STR.   Assessment and Plan:   Generalized weakness/Disposition Therapy as tolerated Now recommended for PTC  Family continues to believe that full scope of care is most appropriate and has declined hospice; palliative care has signed off He is medically ready for discharge at this time, as there are no further studies or consults planned unless there are medical changes Discussed with his granddaughter; will do surveillance MWF labs for now x 3 He is now awaiting placement in a LTC  facility and a family meeting was requested by [facility] TOC for Monday, 7/21, to discuss options for long term placement Email had been sent to his family on Friday, 7/21, with read receipt requested His granddaughter, [name], responded by email on Sunday night requesting the  meeting at 830am; she did not notify staff that she would present in person  Message returned on Monday AM asking that the meeting be at 230pm due to multidisciplinary staff availability [name] showed up in person at about 1040 AM on 7/21 and was accompanied by [facility], which she had arranged to transport him to [facility] and hospitalist attempted to go up to speak with her but the patient was already signed out and out of the building by the time we arrived at 1115   ESRD Initially with AKI on CKD Developed worsening renal failure with hyperkalemia, hyponatremia, and metabolic acidosis Started on HD with improvement (other than encephalopathy - see below) He will need lifelong HD unless family decides on an alternative trajectory (hospice) Nephrology is planning to extend his HD time to try to help clear the BUN; nutrition reports that changing from high protein feeds did not improve BUN but did progress his muscle wasting and so he was changed back Nephrology is recommending further GOC conversations, as he does not appear to be an appropriate long-term dialysis patient   Encephalopathy S/p cardiac arrest during prior hospitalization (extensive chart review shows no evidence of cardiac arrest - only respiratory - during this hospitalization) Neurology consulted and has signed off  Possibly uremic encephalopathy, as his BUN has not effectively cleared despite HD - attempting to increase duration of HD to see if this helps to clear mentation (by way of BUN); this will be primarily followed clinically rather than with labs He may have long-term improvement from this standpoint per neurology - but only if he has further medical and physical recovery and so this is uncertain Neurology has signed off   Achlasia/dysphagia PEG tube was placed, getting tube feeds S/p Botox  in the LES on 5/20 Underwent EGD on 6/10 High risk for aspiration should he be offered PO foods in the future Consider  outpatient referral to tertiary advanced GI for consideration of POEMS procedure - would recommend this only after a few weeks recovery period Has rectal tube in place for diarrhea associated with tube feeds He is on probiotics GI, surgery have signed off   GERD (gastroesophageal reflux disease) Continue PPI    Essential hypertension, benign BP normal off medications   Nutrition Status: Nutrition Problem: Moderate Malnutrition Etiology: acute illness Signs/Symptoms: mild fat depletion, mild muscle depletion, moderate muscle depletion, percent weight loss Interventions: Tube feeding   Goals of care  Previously, Son [name] was to be the primary point of contact  Subsequently, his granddaughter [name] (a physical therapist in [place]) became the designated HCPOA [name] is adamant that he would want full supportive and resuscitative measures and would not want hospice at this time Palliative care has signed off Will attempt to have all family meetings (telephone only, as family is out of town and unable to be physically present) as multidisciplinary meetings with all available service lines who are actively involved in his care (currently only leadership, [facility] team, hospitalist, nephrology, and dietician) His next meeting was scheduled for Monday, 7/21 but his granddaughter signed him out against medical advice prior to the meeting    Severe sepsis due to aspiration PNA with acute hypoxic respiratory failure - resolved  Initially w/  aspiration pna Later in hospital stay w/ intra-abdominal infection secondary to PEG tube not being in the right place Most recently with unexplained leukocytosis with negative blood cultures and urine culture with 60k colonies of Enterococcus He has completed antibiotics, most recently with Zosyn  Leukocytosis is improving  He is no longer being treated with antibiotics and is medically stable from this standpoint; ID has signed off He has an overall poor  prognosis, however, and is at ongoing risk of infection including aspiration   Class 1 obesity Body mass index is 30.62 kg/m.SABRA  Weight loss should be encouraged Outpatient PCP/bariatric medicine f/u encouraged Significantly low or high BMI is associated with higher medical risk including morbidity and mortality           Consultants: PCCM GI Nephrology Vascular surgery Surgery Urology (telephone only) Neurology Palliative care SLP OT PT [facility] team Dietician   Procedures: Bronchoscopy 5/11 PEG tube placement 5/13, 5/2, 5/27 EGD with botox  injection into LES 5/20 Ex lap 5/27 EGD 6/10 TDC placement 6/16 HD 6/16- Perma catheter insertion 6/26   Antibiotics: Unasyn  6/16-20 Azithromycin  x 1 Cefepime  6/30-7/3 Ceftriaxone  5/10-14 Metronidazole  5/10, 6/30-7/3 Zosyn  6/11, 5/26-31, 7/3-15 Vancomycin  6/30-7/5     patient left AMA     Subjective: Seen while he was working with OT.  He was able to say his first name, not oriented to place.  More alert today.   Objective: Vitals:   12/16/23 0303 12/16/23 0751  BP: 127/64 122/68  Pulse: 74 80  Resp: 18 (!) 25  Temp: 98 F (36.7 C) 97.6 F (36.4 C)  SpO2: 98% 97%    Intake/Output Summary (Last 24 hours) at 12/16/2023 1127 Last data filed at 12/16/2023 9051 Gross per 24 hour  Intake 600 ml  Output --  Net 600 ml   Filed Weights   12/12/23 0838 12/12/23 1200  Weight: 95.1 kg 94.1 kg    Exam:  General:  Appears chronically ill, answers limited questions with slurred/unintelligible speech - this was mildly improved during today's evaluation but the OT present said that he has similiarly waxed and waned throughout the hospitalization Eyes:   normal lids, iris ENT:  grossly normal hearing, lips & tongue, mmm Cardiovascular:  RRR. No LE edema.  Respiratory:   CTA bilaterally with no wheezes/rales/rhonchi.  Normal respiratory effort Abdomen:  soft, NT, ND Skin:  no rash or induration seen on limited  exam Musculoskeletal:  generalized weakness Psychiatric:  pleasant mood and affect, speech sparse and mostly unintelligible Neurologic: unable to effectively perform, working with OT actively with reported significant diffuse muscular stiffness/increased tone  Data Reviewed: I have reviewed the patient's lab results since admission.  Pertinent labs for today include:   Na++ 132, not clinically significant Glucose 105 BUN 92/Creatinine 5.82/GFR 9 WBC 10.6, improved Hgb 8.5, stable      The results of significant diagnostics from this hospitalization (including imaging, microbiology, ancillary and laboratory) are listed below for reference.    Significant Diagnostic Studies: EEG adult Result Date: 12/11/2023 Michaela Aisha SQUIBB, MD     12/12/2023  6:03 PM History: 83 year old male with encephalopathy with EEG being performed to rule out any type of seizure activity contributing EEG Duration: 23 minutes Sedation: none Patient State: Awake and asleep Technique: This EEG was acquired with electrodes placed according to the International 10-20 electrode system (including Fp1, Fp2, F3, F4, C3, C4, P3, P4, O1, O2, T3, T4, T5, T6, A1, A2, Fz, Cz, Pz). The following electrodes were missing or displaced: none.  Background: The background consists of generalized intermixed irregular delta and theta range activities. With arousal, there is an increase in faster frequencies, but no definite posterior dominant rhythm is identified. There were no epileptiform discharges. Photic stimulation: Physiologic driving is not performed EEG Abnormalities: 1) generalized irregular slow activity 2) absent posterior dominant rhythm Clinical Interpretation: This normal EEG is recorded in the waking and sleep state. There was no seizure or seizure predisposition recorded on this study. Please note that lack of epileptiform activity on EEG does not preclude the possibility of epilepsy. Aisha Seals, MD [facility] If 7pm- 7am, please page neurology on call as listed in AMION.  CT ABDOMEN PELVIS WO CONTRAST Result Date: 12/04/2023 CLINICAL DATA:  Persistent leukocytosis. Infection of uncertain etiology. EXAM: CT ABDOMEN AND PELVIS WITHOUT CONTRAST TECHNIQUE: Multidetector CT imaging of the abdomen and pelvis was performed following the standard protocol without IV contrast. RADIATION DOSE REDUCTION: This exam was performed according to the departmental dose-optimization program which includes automated exposure control, adjustment of the mA and/or kV according to patient size and/or use of iterative reconstruction technique. COMPARISON:  CT abdomen pelvis dated 11/10/2023. FINDINGS: Evaluation of this exam is limited in the absence of intravenous contrast as well as due to respiratory motion. Lower chest: Partially visualized small bilateral pleural effusions. Large areas of consolidation involving the lower lobes bilaterally, progressed since the prior CT may represent atelectasis or infiltrate. There is 3 vessel coronary vascular calcification. No intra-abdominal free air. Hepatobiliary: The liver is unremarkable. No biliary dilatation. The gallbladder is contracted. Several gallstones. Pancreas: Unremarkable. No pancreatic ductal dilatation or surrounding inflammatory changes. Spleen: Normal in size without focal abnormality. Adrenals/Urinary Tract: The adrenal glands are unremarkable similar or slightly increased bilateral hydronephrosis and hydroureter. There is high attenuating content within the renal collecting systems bilaterally indicative of proteinaceous or hemorrhagic content. No stone. There is a moderate size left perinephric hematoma, new since the prior CT and measuring approximately 5.5 x 14 cm in greatest axial dimensions. Interval increase in the right perinephric stranding. The urinary bladder is collapsed. Stomach/Bowel: Percutaneous gastrostomy with balloon in the body of the stomach.  There is loose stool throughout the colon. There is no bowel obstruction. The appendix is normal. Vascular/Lymphatic: Mild aortoiliac atherosclerotic disease. The IVC is unremarkable. No portal venous gas. There is no adenopathy. Reproductive: The prostate is grossly unremarkable. Other: None Musculoskeletal: Osteopenia with degenerative changes of the spine. No acute osseous pathology. IMPRESSION: 1. Moderate size left perinephric hematoma, new since the prior CT. 2. Similar or slightly increased bilateral hydronephrosis and hydroureter. High attenuating content within the renal collecting systems bilaterally indicative of proteinaceous or hemorrhagic content. No stone. 3. Percutaneous gastrostomy with balloon in the body of the stomach. No bowel obstruction. Normal appendix. 4. Cholelithiasis. 5. Partially visualized small bilateral pleural effusions. Large areas of consolidation involving the lower lobes bilaterally, progressed since the prior CT may represent atelectasis or infiltrate. 6.  Aortic Atherosclerosis (ICD10-I70.0). Electronically Signed   By: Vanetta Chou M.D.   On: 12/04/2023 10:42   MR BRAIN W WO CONTRAST Result Date: 12/03/2023 CLINICAL DATA:  Provided history: Mental status change, unknown cause. EXAM: MRI HEAD WITHOUT AND WITH CONTRAST TECHNIQUE: Multiplanar, multiecho pulse sequences of the brain and surrounding structures were obtained without and with intravenous contrast. CONTRAST:  10mL GADAVIST  GADOBUTROL  1 MMOL/ML IV SOLN COMPARISON:  Head CT 10/11/2023. Brain MRI 08/16/2021. FINDINGS: Brain: Generalized cerebral atrophy. Prominence of the ventricles and sulci, which appears commensurate. Multifocal T2 FLAIR  hyperintense signal abnormality within the cerebral white matter, nonspecific but compatible with moderate chronic small vessel ischemic disease. There are a few nonspecific chronic microhemorrhages scattered within the supratentorial brain. There is no acute infarct. No  evidence of an intracranial mass. No extra-axial fluid collection. No midline shift. No pathologic intracranial enhancement identified. Vascular: Maintained flow voids within the proximal large arterial vessels. Skull and upper cervical spine: No focal worrisome marrow lesion. Sinuses/Orbits: No mass or acute finding within the imaged orbits. Mild mucosal thickening within the right maxillary and right sphenoid sinuses. Other: Trace fluid within left mastoid air cells. IMPRESSION: 1.  No evidence of an acute intracranial abnormality. 2. Moderate chronic small vessel ischemic changes within the cerebral white matter, similar to the prior MRI of 08/16/2021. 3. Few nonspecific chronic microhemorrhages within the supratentorial brain. 4. Generalized cerebral atrophy. 5. Mild mucosal thickening within the right maxillary and right sphenoid sinuses. Electronically Signed   By: Rockey Childs D.O.   On: 12/03/2023 13:41   DG ABDOMEN PEG TUBE LOCATION Result Date: 11/28/2023 CLINICAL DATA:  Check of gastrostomy tube positioning after injection of contrast. EXAM: ABDOMEN - 1 VIEW COMPARISON:  CT of abdomen on 11/26/2023 FINDINGS: Contrast was injected via a pre-existing gastrostomy tube. Contrast is present within the stomach and duodenum at the time of the radiograph. A retention balloon is present at the level of the distal stomach/proximal duodenum as seen on the CT. The tubing of the gastrostomy tube itself is difficult to visualize on the x-ray. It appears likely that the retention balloon has migrated distally. IMPRESSION: Contrast injected via the gastrostomy tube is present within the stomach and duodenum at the time of the radiograph. A retention balloon is present at the level of the distal stomach/proximal duodenum as seen on the CT. The tubing of the gastrostomy tube itself is difficult to visualize on the x-ray. It appears likely that the retention balloon has migrated distally. Recommend retraction of the  gastrostomy tube until the retention balloon is felt at the level of the anterior gastric wall. Electronically Signed   By: Marcey Moan M.D.   On: 11/28/2023 09:26   CT ABDOMEN PELVIS WO CONTRAST Result Date: 11/26/2023 CLINICAL DATA:  Dyspnea abdominal pain, fevers, tachypnea, distention EXAM: CT CHEST, ABDOMEN AND PELVIS WITHOUT CONTRAST TECHNIQUE: Multidetector CT imaging of the chest, abdomen and pelvis was performed following the standard protocol without IV contrast. RADIATION DOSE REDUCTION: This exam was performed according to the departmental dose-optimization program which includes automated exposure control, adjustment of the mA and/or kV according to patient size and/or use of iterative reconstruction technique. COMPARISON:  11/02/2023 FINDINGS: CT CHEST FINDINGS Cardiovascular: Large-bore right neck multi lumen vascular catheter. Normal heart size. Three-vessel coronary artery calcifications no pericardial effusion. Mediastinum/Nodes: No enlarged mediastinal, hilar, or axillary lymph nodes. Thyroid  gland, trachea, and esophagus demonstrate no significant findings. Lungs/Pleura: Small bilateral pleural effusions and associated atelectasis or consolidation, similar to prior examination. Musculoskeletal: No chest wall abnormality. No acute osseous findings. CT ABDOMEN PELVIS FINDINGS Hepatobiliary: No solid liver abnormality is seen. Tiny gallstones. No gallbladder wall thickening, or biliary dilatation. Pancreas: Unremarkable. No pancreatic ductal dilatation or surrounding inflammatory changes. Spleen: Normal in size without significant abnormality. Adrenals/Urinary Tract: Adrenal glands are unremarkable. Moderate bilateral hydronephrosis and hydroureter to the ureterovesicular junctions without calculus or other visible obstruction. Interval increase in extensive bilateral simple attenuation perinephric fluid (series 2, image 80). Severe wall thickening of the decompressed urinary bladder.  Stomach/Bowel: Percutaneous gastrostomy. Appendix not clearly visualized.  No evidence of bowel wall thickening, distention, or inflammatory changes. Sigmoid diverticulosis. Vascular/Lymphatic: Aortic atherosclerosis. No enlarged abdominal or pelvic lymph nodes. Reproductive: No mass or other abnormality. Other: No abdominal wall hernia.  Anasarca.  No ascites. Musculoskeletal: No acute osseous findings. IMPRESSION: 1. Moderate bilateral hydronephrosis and hydroureter to the ureterovesicular junctions without calculus or other visible obstruction likely secondary to ureterovesicular junction due to bladder wall edema. 2. Interval increase in extensive bilateral simple attenuation perinephric fluid. 3. Pleural effusion similar to prior examination.  Anasarca. 4. Cholelithiasis. 5. Percutaneous gastrostomy. 6. Coronary artery disease. Aortic Atherosclerosis (ICD10-I70.0). Electronically Signed   By: Marolyn JONETTA Jaksch M.D.   On: 11/26/2023 19:16   CT CHEST WO CONTRAST Result Date: 11/26/2023 CLINICAL DATA:  Dyspnea abdominal pain, fevers, tachypnea, distention EXAM: CT CHEST, ABDOMEN AND PELVIS WITHOUT CONTRAST TECHNIQUE: Multidetector CT imaging of the chest, abdomen and pelvis was performed following the standard protocol without IV contrast. RADIATION DOSE REDUCTION: This exam was performed according to the departmental dose-optimization program which includes automated exposure control, adjustment of the mA and/or kV according to patient size and/or use of iterative reconstruction technique. COMPARISON:  11/02/2023 FINDINGS: CT CHEST FINDINGS Cardiovascular: Large-bore right neck multi lumen vascular catheter. Normal heart size. Three-vessel coronary artery calcifications no pericardial effusion. Mediastinum/Nodes: No enlarged mediastinal, hilar, or axillary lymph nodes. Thyroid  gland, trachea, and esophagus demonstrate no significant findings. Lungs/Pleura: Small bilateral pleural effusions and associated atelectasis  or consolidation, similar to prior examination. Musculoskeletal: No chest wall abnormality. No acute osseous findings. CT ABDOMEN PELVIS FINDINGS Hepatobiliary: No solid liver abnormality is seen. Tiny gallstones. No gallbladder wall thickening, or biliary dilatation. Pancreas: Unremarkable. No pancreatic ductal dilatation or surrounding inflammatory changes. Spleen: Normal in size without significant abnormality. Adrenals/Urinary Tract: Adrenal glands are unremarkable. Moderate bilateral hydronephrosis and hydroureter to the ureterovesicular junctions without calculus or other visible obstruction. Interval increase in extensive bilateral simple attenuation perinephric fluid (series 2, image 80). Severe wall thickening of the decompressed urinary bladder. Stomach/Bowel: Percutaneous gastrostomy. Appendix not clearly visualized. No evidence of bowel wall thickening, distention, or inflammatory changes. Sigmoid diverticulosis. Vascular/Lymphatic: Aortic atherosclerosis. No enlarged abdominal or pelvic lymph nodes. Reproductive: No mass or other abnormality. Other: No abdominal wall hernia.  Anasarca.  No ascites. Musculoskeletal: No acute osseous findings. IMPRESSION: 1. Moderate bilateral hydronephrosis and hydroureter to the ureterovesicular junctions without calculus or other visible obstruction likely secondary to ureterovesicular junction due to bladder wall edema. 2. Interval increase in extensive bilateral simple attenuation perinephric fluid. 3. Pleural effusion similar to prior examination.  Anasarca. 4. Cholelithiasis. 5. Percutaneous gastrostomy. 6. Coronary artery disease. Aortic Atherosclerosis (ICD10-I70.0). Electronically Signed   By: Marolyn JONETTA Jaksch M.D.   On: 11/26/2023 19:16   DG Chest 1 View Result Date: 11/25/2023 CLINICAL DATA:  Shortness of breath EXAM: PORTABLE CHEST 1 VIEW COMPARISON:  Film from earlier in the same day. FINDINGS: Dialysis catheter is again identified. Cardiac shadow is  prominent but stable. Patient rotation accentuates the mediastinal markings. Central vascular congestion is noted with mild interstitial edema. Stable left basilar atelectasis is noted. IMPRESSION: Mild changes of CHF. Left basilar atelectasis. Electronically Signed   By: Oneil Devonshire M.D.   On: 11/25/2023 22:29   DG Chest Port 1 View Result Date: 11/25/2023 CLINICAL DATA:  Dyspnea.  Weakness EXAM: PORTABLE CHEST 1 VIEW COMPARISON:  11/11/2023 FINDINGS: Interval placement of a dual lumen dialysis catheter. Tips in the distal SVC. Stable cardiac silhouette. There is new a peripheral interstitial linear markings consistent interstitial edema. LEFT  basilar atelectasis and small effusion. No pneumothorax. IMPRESSION: 1. Interval placement of dialysis catheter. 2. Interstitial edema. 3. LEFT basilar atelectasis and small effusion. Electronically Signed   By: Jackquline Boxer M.D.   On: 11/25/2023 18:57   PERIPHERAL VASCULAR CATHETERIZATION Result Date: 11/21/2023 See surgical note for result.   Microbiology: No results found for this or any previous visit (from the past 240 hours).   Labs: CBC: Recent Labs  Lab 12/11/23 0421 12/12/23 0505 12/13/23 0517 12/14/23 0945 12/16/23 0421  WBC 13.1* 13.1* 14.5* 13.3* 10.6*  NEUTROABS  --  10.1* 11.0*  --   --   HGB 8.2* 8.0* 8.1* 8.3* 8.5*  HCT 26.4* 25.2* 25.3* 26.6* 27.8*  MCV 90.7 92.0 91.3 92.7 91.7  PLT 380 358 347 376 373   Basic Metabolic Panel: Recent Labs  Lab 12/11/23 0421 12/12/23 0505 12/13/23 0517 12/14/23 0945 12/16/23 0421  NA 133* 133* 134* 133* 132*  K 4.0 4.1 4.0 3.9 4.1  CL 95* 95* 96* 94* 92*  CO2 24 24 27 27 25   GLUCOSE 109* 114* 112* 128* 105*  BUN 78* 92* 51* 85* 90*  CREATININE 5.88* 7.28* 4.65* 6.36* 5.82*  CALCIUM  8.1* 8.3* 8.1* 8.1* 8.3*  PHOS  --   --   --  5.7*  --    Liver Function Tests: Recent Labs  Lab 12/10/23 0334 12/14/23 0945  AST 24  --   ALT 17  --   ALKPHOS 76  --   BILITOT 0.4  --    PROT 7.7  --   ALBUMIN  2.1* 1.9*   No results for input(s): LIPASE, AMYLASE in the last 168 hours. Recent Labs  Lab 12/10/23 2234  AMMONIA 54*   Cardiac Enzymes: No results for input(s): CKTOTAL, CKMB, CKMBINDEX, TROPONINI in the last 168 hours. BNP (last 3 results) Recent Labs    10/05/23 0506 10/12/23 1104 11/25/23 2158  BNP 70.6 103.5* 120.4*   CBG: Recent Labs  Lab 12/14/23 2013 12/15/23 0753 12/15/23 1206 12/15/23 1833 12/16/23 0752  GLUCAP 126* 114* 125* 140* 106*   Time spent: 45 minutes  Signed:  Delon Herald  [facility] 12/16/2023, 11:19 AM

## 2023-12-16 NOTE — TOC Transition Note (Signed)
 Transition of Care [facility]) - Discharge Note   Patient Details  Name: Robert Vega MRN: [id number] Date of Birth: [birth year]  Transition of Care [facility] CM/SW Contact:  [name], RN Phone Number: 12/16/2023, 11:32 AM   Clinical Narrative:      Spoke with [name] Admission with [facility] who states if family were to pursue placement at their facility outpatient HD would have to be arranged at [facility] in [place].  She states that the private pay cost for Semi private room is $345/ day; private room is $360/day  Received email granddaughter/POA [name] dated 12/15/23 1027 am requesting interdisciplinary conference call 12/16/23 at 830 am  TOC reached out to care team to determine their availability   Secure Emails sent below to [name] POA/granddaughter and son [name]  -8:20 am Good morning. I have reached out to the team members to check their availability for this morning.  -8:37 am - Some of the Team members are not currently available due to morning meetings.   Delphine who is the [facility] supervisor is working to get a couple of alternative times  -9:13 am After speaking with the team, 230 pm is the time that most members of his team are available.    Would 230 pm work for you and your dad?  Thanks [employer] team received notification at 1040 am that [name] was at bedside requesting for patient to discharge AMA.  This TOC member, TOC supervisor, and MD went to the medical unit to discuss disposition, [name] had already signed AMA paperwork, and patient was no longer in the room     Barriers to Discharge: Continued Medical Work up   Patient Goals and CMS Choice            Discharge Placement                       Discharge Plan and Services Additional resources added to the After Visit Summary for                                       Social Drivers of Health (SDOH) Interventions SDOH Screenings   Food  Insecurity: Patient Unable To Answer (10/06/2023)  Recent Concern: Food Insecurity - Food Insecurity Present (09/11/2023)   Received from [facility]  Housing: Patient Unable To Answer (10/06/2023)  Recent Concern: Housing - High Risk (09/11/2023)   Received from [facility]  Transportation Needs: Patient Unable To Answer (10/06/2023)  Utilities: Patient Unable To Answer (10/06/2023)  Depression (PHQ2-9): Low Risk  (05/07/2023)  Financial Resource Strain: Medium Risk (09/11/2023)   Received from [facility]  Social Connections: Unknown (10/06/2023)  Tobacco Use: Medium Risk (11/05/2023)     Readmission Risk Interventions     No data to display

## 2023-12-16 NOTE — Plan of Care (Signed)
   Problem: Education: Goal: Knowledge of General Education information will improve Description: Including pain rating scale, medication(s)/side effects and non-pharmacologic comfort measures Outcome: Progressing   Problem: Activity: Goal: Risk for activity intolerance will decrease Outcome: Progressing   Problem: Nutrition: Goal: Adequate nutrition will be maintained Outcome: Progressing

## 2023-12-16 NOTE — ED Triage Notes (Signed)
 Pt presenting from OSH with transport for concern of sepsis. Pt presented to ED for concern of weakness. Pt known dialysis Pt.   Family voiced concern for transfer over to [facility]. Pt unknown last dialysis.  GCS 15.

## 2023-12-16 NOTE — ED Provider Notes (Signed)
 [facility] EMERGENCY DEPT  ED Provider Note History   Chief Complaint  Patient presents with  . Dialysis - Needs    History of Present Illness Robert Vega is an 83 year old male who presents with complications following esophageal choking and subsequent medical interventions. He is accompanied by [name], his granddaughter and medical power of attorney. He was referred by care providers for further evaluation and management.  He initially experienced choking due to an esophageal issue, leading to intubation and PEG tube placement. The PEG tube was improperly positioned, causing feeding into the abdomen and sepsis, requiring emergency surgery. A new PEG tube was placed, but he developed dehydration and worsening kidney function, necessitating dialysis.  He experienced a recurrence of sepsis with a high white blood cell count, managed with antibiotics. The infection source was suspected to be related to the kidney or bladder, with discussions of placing a stent to aid drainage and prevent further infections.  He has undergone Botox  injections for esophageal issues, temporarily allowing a diet of clears and purees, though dietary changes were reversed with care team changes. His dialysis schedule was recently adjusted due to elevated BUN and potassium levels. His condition has shown some improvement, with better responsiveness and participation in care. HPI Level of Interpreter Services: No interpreter needed (no language barrier)  No past medical history on file. No past surgical history on file. Family History  Problem Relation Age of Onset  . Cerebral aneurysm Mother    Social History   Socioeconomic History  . Marital status: Married  Tobacco Use  . Smoking status: Former    Types: Cigars, Pipe    Quit date: 2015    Years since quitting: 10.5  . Smokeless tobacco: Never  . Tobacco comments:    Patient confirms previously smoking cigars/ pipes. Quit sometime in 2015. 09/11/2023  -IC  Substance and Sexual Activity  . Alcohol  use: Never  . Drug use: Never   Social Drivers of Corporate investment banker Strain: Medium Risk (09/11/2023)   Overall Financial Resource Strain (CARDIA)   . Difficulty of Paying Living Expenses: Somewhat hard  Food Insecurity: Patient Unable To Answer (10/06/2023)   Received [facility]   Hunger Vital Sign   . Within the past 12 months, you worried that your food would run out before you got the money to buy more.: Patient unable to answer   . Within the past 12 months, the food you bought just didn't last and you didn't have money to get more.: Patient unable to answer  Recent Concern: Food Insecurity - Food Insecurity Present (09/11/2023)   Hunger Vital Sign   . Worried About Programme researcher, broadcasting/film/video in the Last Year: Sometimes true   . Ran Out of Food in the Last Year: Never true  Transportation Needs: Patient Unable To Answer (10/06/2023)   Received from [facility] - Transportation   . Lack of Transportation (Medical): Patient unable to answer   . Lack of Transportation (Non-Medical): Patient unable to answer  Social Connections: Unknown (10/06/2023)   Received from [facility]   Social Connection and Isolation Panel   . In a typical week, how many times do you talk on the phone with family, friends, or neighbors?: Patient unable to answer   . How often do you get together with friends or relatives?: Patient unable to answer   . How often do you attend church or religious services?: Patient unable to answer   .  Do you belong to any clubs or organizations such as church groups, unions, fraternal or athletic groups, or school groups?: Patient unable to answer   . How often do you attend meetings of the clubs or organizations you belong to?: Patient unable to answer  Housing Stability: High Risk (09/11/2023)   Housing Stability Vital Sign   . Unable to Pay for Housing in the Last Year: Yes   . Homeless in the Last Year: No    Review of Systems  Physical Exam  BP (!) 163/81 (BP Location: Left upper arm, Patient Position: Lying)   Pulse 71   Temp 36.2 C (97.2 F) (Tympanic)   Resp 21   Wt (!) 105.4 kg (232 lb 5.8 oz)   SpO2 97%   BMI 34.82 kg/m  Physical Exam Physical Exam Constitutional: Somnolent responsive to painful stimuli HEENT: Nasal cannula Cardiovascular: Regular rate and rhythm.  Right chest PICC line without erythema or warmth or discharge. ABDOMEN: Right lower quadrant tenderness on palpation.  Midline surgical scar.  PEG tube in place without surrounding erythema warmth or discharge.  Procedures  Procedures   Results LABS BUN: 101 Potassium: 4.4 mEq/L Medical Decision Making   Medical Decision Making     Differential Diagnosis:    In addition to the differential diagnoses listed, other diagnoses were considered.   Medical Complexity:    New and requires workup.     Pertinent labs & imaging results that were available during my care of the patient were reviewed by me and considered in my medical decision making.     I obtained history from someone other than the patient.         Patient's granddaughter, POA   I reviewed previous medical records.         [facility] inpatient notes   I independently visualized image(s), tracing(s), and/or specimen(s).     I discussed the patient with another provider.         Area attending Dr. Carlin Spears Summary Statement:     Patient is 83 year old male ESRD, CAD, CHF.  Patient presented to [facility] health on 5/10 with concern for aspiration had respiratory arrest event.  Was intubated provide of broad-spectrum antibiotics.  EGD completed with concerns for achalasia.  PEG tube was placed, moved manually by patient and corrected replaced.  Underwent laparoscopic replacement.  Patient been receiving tube feeds.  Rising creatinine HD sessions initiated.  Last received HD on 6/19. Planned for SNF placement.  Vital signs significant for  elevated blood pressure on arrival.  On exam, patient responds to painful stimuli.  There are coarse lung fields noted on expiration.  Heart rate is regular without accessory sounds.  He has abdominal tenderness most notable on right lower and left lower quadrant, will obtain CT imaging.  Strongly consider uremia and elevated uric acid complications given patient's altered mental state.  Will urine to rule out UTI as cause for sepsis.  Head imaging without overt intracranial bleed or overt abnormality.   Assessment & Plan Sepsis Sepsis secondary to suspected urinary source with significant leukocytosis. - Continue antibiotics.  Elevated White Blood Cell Count Leukocytosis at 11.5. - Consider antibiotics. - Monitor white blood cell count.  Abdominal Tenderness Abdominal tenderness possibly related to PEG tube with risk of abscess. - Order abdominal imaging.  Esophageal Disorder with PEG Tube Esophageal disorder with functioning PEG tube, concern for complications due to abdominal tenderness.  Chronic Kidney Disease with Dialysis Chronic kidney disease on dialysis with recent  schedule change, elevated BUN due to missed session, improved with fluid resuscitation. - Coordinate with nephrology for dialysis schedule and protein intake. - Monitor BUN and renal function markers.  ED Course as of 12/16/23 2308  Mon Dec 16, 2023  1547 BUN(!): 101 [AB]  1547 Sodium(!): 131 [AB]  1547 Chloride(!): 93 [AB]  1547 BUN/CREA Ratio: 16 [AB]  1550 ECG with nonspecific ST elevation, considered uremic pericarditis [AB]  1551 pH, Venous(!): 7.28 [AB]  1551 Anion Gap(!): 14 Considered  uremic acidosis [AB]  1559 Via chart review, patient dialyzed on 12/15/23 [AB]  1643 Lactate, Venous: 1.0 [AB]    ED Course User Index [AB] Patterson Darin Booker, PA      Medications Administered in the Emergency Department   lidocaine  (XYLOCAINE ) 1 % injection 0.5 mL (has no administration in time range) dextrose   50% in water  solution 12.5-25 g (has no administration in time range) glucagon (GLUCAGEN) injection 1 mg (has no administration in time range) heparin  (porcine) 1,000 unit/mL injection (has no administration in time range) dextrose  10 % infusion (has no administration in time range) tube feeding (NOVASOURCE RENAL) liquid (has no administration in time range) aspirin  EC tablet 81 mg (has no administration in time range) omeprazole  (PRILOSEC) 2 mg/mL oral suspension 40 mg (has no administration in time range) acetaminophen  (TYLENOL ) tablet 650 mg (has no administration in time range) ondansetron  (ZOFRAN -ODT) disintegrating tablet 4 mg (has no administration in time range)   Or ondansetron  (PF) (ZOFRAN ) injection 4 mg (has no administration in time range) heparin  (porcine) 5,000 unit/mL injection 5,000 Units (has no administration in time range) sodium chloride  0.9 % bolus 250 mL (0 mLs Intravenous Stopped 12/16/23 1802) iopamidoL (ISOVUE-300) 300 mg iodine /mL (61 %) injection 150 mL (150 mLs Intravenous Given 12/16/23 2240)   ED Clinical Impression  1. Altered mental status, unspecified altered mental status type      ED Disposition  Admit         Differential Diagnosis (Differential DX)   Diagnoses that have been ruled out:  None  Diagnoses that are still under consideration:  None  Final diagnoses:  Altered mental status, unspecified altered mental status type   This note has been created using automated tools and reviewed for accuracy by DARIN BOOKER PATTERSON.   AI Note Feedback-- Point of Care Survey   Patterson Darin Booker, [place] 12/16/23 2308

## 2023-12-16 NOTE — ED Notes (Signed)
 MD at bedside.

## 2023-12-16 NOTE — ED Notes (Signed)
 This RN informed HUC that patient would be coming up shortly. This RN offered questions to floor RN. All questions answered. No further questions from floor RN.

## 2023-12-16 NOTE — ED Notes (Addendum)
 RN received report from Mining engineer. Patient presents with needs for dialysis. Patient was [facility] by grandaughter from OSH for higher level of care. Patient has had recent hospitalizations with aspiration concerns requiring intubation. This RN is unable to assess patient A&O d/t AMS. Grandaughter reports patient is off from his new baseline but usually he mentates better after dialysis. Patient is ill appearing, ABC intact, respirations occasionally tachypneic. Patient placed on continuous cardiac monitoring and pulse oximetry. Patient with call bell in reach with bed in lowest and locked position.

## 2023-12-16 NOTE — ED Notes (Signed)
 Patient placed on tube feed per [facility] instructions and medicated per MAR. Patient appears comfortable at this time.  ABCs intact. Patient's respirations intermittently tachypneic. Patient remains on continuous cardiac monitoring and pulse oximetry. Bed remains low & locked with call bell within reach.

## 2023-12-16 NOTE — Consults (Signed)
 Nephrology Consult Note  History of Present Illness   Robert Vega is a 83 y.o. male with little medical history including dysphagia, undergoing evaluation, and CKD (baseline Cr 1.6) who presents to the [facility] ED after leaving [place] [facility] earlier today in the setting of prolonged admission since May 2025 for encephalopathy and sepsis secondary to pneumonia versus cystitis, with hospitalization complicated by aspiration status post PEG tube complicated by malpositioning and subsequent intraabdominal infection requiring laparotomy on 10/22/23, respiratory arrest, and AKI on CKD (baseline Cr previously 1.6) ultimately requiring initiation of hemodialysis on 6/16.   The plan had been for long term care facility discharge. This morning his granddaughter Robert Vega arranged for private transportation to [place], where he receives his medical care.   On review of external records, last dialysis was on Saturday with 1L UF. On extensive review of records, mental status has waxed and waned throughout his prolonged admission, felt to be multifactorial. No seizures on EEG; negative brain imaging.   Labs at OSH prior to transfer with BUN 90, Na 132, K 4.1, HCO3 25. Labs on arrival to [facility] with BUN 101, Na 131, K 4.4, HCO3 24.   CT abdomen is scheduled but not yet performed. CT head without acute findings. Chest x-ray with mild pulmonary edema.   He is normotensive and satting 97% on 2L O2.   On evaluation, he answers questions appropriately though does require prompting. His granddaughter Robert Vega is at bedside. She states this morning he was working with PT and sat on the edge of the bed. His mental status has very slowly been improving over the past several days with this AM being his best. In the ED hallway, he was quite alert and interactive as well. On evaluation, again he answers questions overall appropriately with single word answers. ROS positive for abdominal pain only.   Past Medical History    There are no active problems to display for this patient.  Social History   Socioeconomic History  . Marital status: Married  Tobacco Use  . Smoking status: Former    Types: Cigars, Pipe    Quit date: 2015    Years since quitting: 10.5  . Smokeless tobacco: Never  . Tobacco comments:    Patient confirms previously smoking cigars/ pipes. Quit sometime in 2015. 09/11/2023 -[facility]  Substance and Sexual Activity  . Alcohol  use: Never  . Drug use: Never   Social Drivers of Corporate investment banker Strain: Medium Risk (09/11/2023)   Overall Financial Resource Strain (CARDIA)   . Difficulty of Paying Living Expenses: Somewhat hard  Food Insecurity: Patient Unable To Answer (10/06/2023)   Received from [facility]   Hunger Vital Sign   . Within the past 12 months, you worried that your food would run out before you got the money to buy more.: Patient unable to answer   . Within the past 12 months, the food you bought just didn't last and you didn't have money to get more.: Patient unable to answer  Recent Concern: Food Insecurity - Food Insecurity Present (09/11/2023)   Hunger Vital Sign   . Worried About Programme researcher, broadcasting/film/video in the Last Year: Sometimes true   . Ran Out of Food in the Last Year: Never true  Transportation Needs: Patient Unable To Answer (10/06/2023)   Received from [facility] - Transportation   . Lack of Transportation (Medical): Patient unable to answer   . Lack of Transportation (Non-Medical): Patient unable to  answer  Social Connections: Unknown (10/06/2023)   Received from [facility]   Social Connection and Isolation Panel   . In a typical week, how many times do you talk on the phone with family, friends, or neighbors?: Patient unable to answer   . How often do you get together with friends or relatives?: Patient unable to answer   . How often do you attend church or religious services?: Patient unable to answer   . Do you belong to any clubs or  organizations such as church groups, unions, fraternal or athletic groups, or school groups?: Patient unable to answer   . How often do you attend meetings of the clubs or organizations you belong to?: Patient unable to answer  Housing Stability: High Risk (09/11/2023)   Housing Stability Vital Sign   . Unable to Pay for Housing in the Last Year: Yes   . Homeless in the Last Year: No   Family History  Problem Relation Name Age of Onset  . Cerebral aneurysm Mother     Allergies:  Allergies  Allergen Reactions  . Sulfa (Sulfonamide Antibiotics) Other (See Comments)   Home meds: Current Outpatient Medications  Medication Instructions  . acetaminophen  (TYLENOL ) 650 mg, Every 8 hours PRN  . amLODIPine  (NORVASC ) 2.5 MG tablet 1 tablet, Daily  . aspirin  81 mg, Daily  . dilTIAZem  (CARDIZEM  CD) 120 mg, Oral, Daily  . docusate (COLACE) 50 mg, 2 times Daily  . fluticasone propionate (FLONASE) 50 mcg/actuation nasal spray 1 spray, Both Nares, 2 times Daily  . glucosamine sul-chondroitn-msm 500-200-150 mg Tab 1 tablet, Every morning  . guaiFENesin  1,200 mg Ta12 1 tablet, As needed  . lansoprazole  (PREVACID ) 30 MG DR capsule 1 capsule, Nightly  . multivitamin with minerals tablet 1 tablet, Daily  . tadalafiL  (CIALIS ) 20 mg, Daily    Objective    Current Vital Signs 24h Vital Sign Ranges  T 36.4 C (97.5 F) Temp  Avg: 36.4 C (97.5 F)  Min: 36.4 C (97.5 F)  Max: 36.4 C (97.5 F)  BP (!) 145/80 BP  Min: 123/76  Max: 145/80  HR 76 Pulse  Avg: 74  Min: 66  Max: 82  RR 19 Resp  Avg: 23.6  Min: 18  Max: 30  SaO2 97 % Nasal cannula SpO2  Avg: 96.8 %  Min: 95 %  Max: 99 %          Ins & Outs No intake/output data recorded. Current Shift:  No intake/output data recorded.   Physical Exam   Gen: no acute distress CV: regular rate and rhythm  Pulm: clear anteriorly; exam limited by positioning; lying flat with breathing non labored Extr: no peripheral edema bilaterally Neuro: awake, eyes  open; identifies his granddaughter; answers yes/ no appropriately. Otherwise limited participation in exam.   Labs   Recent Labs  Lab 12/16/23 1333  WBC 11.5*  HGB 9.1*  PLT 407   Recent Labs  Lab 12/16/23 1333  NA 131*  129*  K 4.4  CL 93*  CO2 24  BUN 101*  CREATININE 6.5*  CALCIUM  8.4*  ALKPHOS 102  GLUCOSE 115   Lab Results  Component Value Date   CALCIUM  8.4 (L) 12/16/2023   No results found for: IRON, TIBC, FERRITIN, PCTSAT  Assessment and Recommendations   Mr. Runyon is an 83 y.o. male with little medical history including dysphagia (undergoing evaluation at [facility] prior to admission) and CKD (baseline Cr 1.6) who presents to the [facility] ED after  leaving [place] [facility] earlier today in the setting of prolonged admission since May for encephalopathy and sepsis secondary to pneumonia versus cystitis, with hospitalization complicated by aspiration status post PEG tube complicated by malpositioning and subsequent intraabdominal infection requiring laparotomy on 10/22/23, respiratory arrest, and AKI on CKD (baseline Cr previously 1.6) requiring initiation of hemodialysis on 6/16. Nephrology is consulted for ongoing dialysis needs.   His last HD was on Saturday. On exam he is euvolemic. Regarding mental status this evening, he is overall awake and alert and answers simple questions appropriately including identifying his granddaughter at bedside. Note BUN is elevated to 100, however he is on tube feeds with extra protein supplementation and as above he had dialysis two days ago so azotemia may be out of proportion to uremic symptoms. His BUN was 50 on 7/18 and family notes that this morning his mentation was at its best (BUN 95).   Labs without acute indications for HD this evening including K < 5.0 and he appears overall clinically euvolemic therefore will plan for dialysis tomorrow, 7/22 morning.   Of note, given elevated BUN even though I suspect part of this is from  tube feeds, I do think it would be reasonable to trial dialysis for 2-3 days sequentially to determine whether significant decreases in BUN contribute to notable improvement in mentation.    AKI on CKD, ?ESKD  HD tomorrow, 7/22  Will plan for slightly reduced flow rates given confounding BUN elevation  Tentative plan also for HD on Wednesday 7/23  Consent signed and in HD unit  Follow up CT abdomen to ensure no worsening urinary obstruction or intra-abdominal pathology in setting of abdominal pain Strict I/O, monitor for evidence of renal recovery (has been on dialysis since 11/11/23; difficult to find urine output quantifications from outside hospital however lab trends are not suggestive of meaningful clearance)  Please check iron studies: ferritin, TSAT   Standard Recommendations Daily renal function panel (RFP) Please dose all medications for reduced eGFR Avoid NSAIDs, morphine , and baclofen. Avoid magnesium-containing laxatives (milk of magnesia, magnesium hydroxide) and enemas (SMOG). Avoid phosphate-containing laxative (sodium phosphate ) enemas (Fleet phospho-soda).  Thank you for this consult. We will continue to follow the patient with you.  Please feel free to call with questions or concerns.   Keitha Potts, MD Nephrology Fellow  Floor service pager [phone]   The Nephrology Consult team is available in-house from 7A-5P. If urgent assistance is needed outside of these hours, please page the Nephrology on-call pager [phone] for assistance. The Nephrology consult pagers are available after business hours for emergencies only.    ------------------------------------------------------------------------------- Attestation signed by Jason Devere Kay, MD at 12/17/2023 10:49 AM Attestation Statement:   I personally saw and evaluated the patient, and participated in the management and treatment plan as documented in the resident/fellow note.  I saw Mr Lanni for the  first time on dialysis. He appears very unwell to me and is obtunded though his vitals are stable and he does respond to pain.  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 Will need bilateral percutaneous nephrostomies.   Tolerated 2.5 hours of dialysis, but line clotted at this point. Will discontinue for now and can plan for additional dialysis sessions tomorrow if need be.  Spoke to the primary team regarding our concerns about his deterioration and ability to tolerate percutaneous nephrostomy.  DEVERE KAY JASON, MD  -------------------------------------------------------------------------------

## 2023-12-16 NOTE — Progress Notes (Signed)
 I performed a history and physical examination of Robert Vega as documented in the resident/fellow/APP note and discussed his management with:  Treatment Team:  Attending Provider: Delene Carlin Rush, MD Physician Assistant: Patterson Darin Booker, PA Registered Nurse: [place], Haley, RN   I agree with the history, physical, assessment, and plan of care, with the following exceptions: None  Attestation Statement:   I personally saw the patient and performed a substantive portion of the medical decision making, in conjunction with the Advanced Practice Provider for the condition/treatment of altered mental status.  The patient is an 83 year old male brought in for altered mental status.  He has a complex history who was healthy up until recent hospitalization at [facility].  His subsequent to his complicated course and prolonged ICU stay he ended up with end-stage renal disease.  He also was ambulatory taking care of himself lives alone prior to this and no one was able to do that.  He was inpatient and was having altered mental status and the daughter signed out AMA.  He was picked up by ambulance and brought to the [facility] emergency department.  Here he is quite altered he has had an extensive workup.  His altered mental status is likely secondary to uremia with a BUN of 100.  Will consult nephrology and he will require GEN med admission for further diagnostic evaluation and treatment.  Of note his exam is not totally reliable but he has had a complicated course regarding a G-tube including required abdominal surgery for intraperitoneal administration of his feedings per the daughter's report.  Consequently obtain a CT scan of his abdomen as well which is pending.  Robert Vega

## 2023-12-16 NOTE — ED Notes (Signed)
 Pt here from OSH. Pt was admitted at OSH for several weeks, and advised to establish care at [facility] instead by staff. Pt's granddaughter (POA) drove pt here from [place] earlier today. Pt baseline is A&Oox4, ambulatory, active. Since being admitted at OSH, pt has become lethargic, septic, with a PEG tube, on dialysis, oxygen dependent, and nonverbal. No known recent fevers per pt's granddaughter. Pt resting in bed, rr even and unlabored, no needs at this time.

## 2023-12-16 NOTE — H&P (Addendum)
 Hospital Medicine Admission History & Physical  Time of Service: 12/16/2023, 11:13 PM  PCP: Jimmy Charlie FERNS, MD, Phone [phone], Fax [phone]   Chief Complaint  Altered mental status  History of Present Illness  Robert Vega is a 83 y.o. male HTN, GERD, dysphagia, new ESRD on dialysis who was brought to [facility] ED after being signed out AMA from [facility].  Patient was undergoing work-up for progressive dysphagia in the outpatient setting. Had EGD 08/2020 with abnormal cricopharyngeus, decreased esophageal motility and spastic LES. Had EGD 03/2022 with food in upper esophagus that was complicated by aspiration event leading to cardiac arrest. Post-resuscitation, EGD showed lack of peristalsis.   Per discharge summary in Care Everywhere, patient presented 10/05/2023 with sepsis 2/2 aspiration PNA leading to respiratory arrest (no CPR) with intubation and ICU admission. He had botox  injection to LES on 5/20. Esophagram 5/21 showed diffusely distended esophagus with distal obstruction and severe dysmotility suggestive of achalasia. He underwent PEG tube placement 5/13. Course complicated by AKI which progressed to ESRD necessitating initiation of iHD via R tunneled catheter. Course also complicated by severe delirium resulting in self-removal of G-tube requiring surgical replacement with ex-lap. Had waxing and waning mental status throughout admission, neurology consulted who felt it was toxic metabolic. Thought possibly uremic given persistently elevated BUN despite HD. Palliative care was consulted to facilitate GOC discussion but granddaughter [name] (reported HCPOA) not interested in hospice at this time. He received the following abx courses during his stay for recurrent PNA and UTI:  Unasyn  6/16-20 Azithromycin  x 1 Cefepime  6/30-7/3 Ceftriaxone  5/10-14 Metronidazole  5/10, 6/30-7/3 Zosyn  6/11, 5/26-31, 7/3-15 Vancomycin  6/30-7/5   Unable to get in touch  with family on admission to corroborate story, but per discharge summary patient was stable and awaiting discharged to LTC when granddaughter arranged for transport to [facility] ED and signed patient out against medical advice.   In the ED here, T 36.4, BP 130-160s/70s, HR 70s, RR 30s, SpO2 92% on 2L [place]. Labs showed BUN 101, WBC 11.5. CXR showed bilateral lower lobe opacities c/f atelectasis vs. infection and mild pulmonary edema. CT head showed air fluid level in R sphenoid sinus, no acute intracranial abnormalities. Nephrology consulted, planning for HD tomorrow. Admitted to gen med for further management.   Medical History  Past Medical History No past medical history on file.  Past Surgical History No past surgical history on file.  Family History Family History  Problem Relation Name Age of Onset  . Cerebral aneurysm Mother      Social History Social History   Socioeconomic History  . Marital status: Married  Tobacco Use  . Smoking status: Former    Types: Cigars, Pipe    Quit date: 2015    Years since quitting: 10.5  . Smokeless tobacco: Never  . Tobacco comments:    Patient confirms previously smoking cigars/ pipes. Quit sometime in 2015. 09/11/2023 -IC  Substance and Sexual Activity  . Alcohol  use: Never  . Drug use: Never   Social Drivers of Corporate investment banker Strain: Medium Risk (09/11/2023)   Overall Financial Resource Strain (CARDIA)   . Difficulty of Paying Living Expenses: Somewhat hard  Food Insecurity: Patient Unable To Answer (10/06/2023)   Received from [facility]   Hunger Vital Sign   . Within the past 12 months, you worried that your food would run out before you got the money to buy more.: Patient unable to answer   . Within the past 12  months, the food you bought just didn't last and you didn't have money to get more.: Patient unable to answer  Recent Concern: Food Insecurity - Food Insecurity Present (09/11/2023)   Hunger Vital Sign   . Worried  About Programme researcher, broadcasting/film/video in the Last Year: Sometimes true   . Ran Out of Food in the Last Year: Never true  Transportation Needs: Patient Unable To Answer (10/06/2023)   Received from [facility] - Transportation   . Lack of Transportation (Medical): Patient unable to answer   . Lack of Transportation (Non-Medical): Patient unable to answer    Allergies & Medications   Allergies  Allergen Reactions  . Sulfa (Sulfonamide Antibiotics) Other (See Comments)    Medications Prior to Admission Medications  Prescriptions Last Dose Taking?  acetaminophen  (TYLENOL ) 650 MG ER tablet  No  Sig: Take 650 mg by mouth every 8 (eight) hours as needed  amLODIPine  (NORVASC ) 2.5 MG tablet  No  Sig: Take 1 tablet by mouth once daily  aspirin  81 MG EC tablet  No  Sig: Take 81 mg by mouth once daily  dilTIAZem  (CARDIZEM  CD) 120 MG XR capsule  No  Sig: Take 1 capsule (120 mg total) by mouth once daily  docusate (COLACE) 50 MG capsule  No  Sig: Take 50 mg by mouth 2 (two) times daily  fluticasone propionate (FLONASE) 50 mcg/actuation nasal spray  No  Sig: Place 1 spray into both nostrils 2 (two) times daily  glucosamine sul-chondroitn-msm 500-200-150 mg Tab  No  Sig: Take 1 tablet by mouth every morning  guaiFENesin  1,200 mg Ta12  No  Sig: Take 1 tablet by mouth as needed  lansoprazole  (PREVACID ) 30 MG DR capsule  No  Sig: Take 1 capsule by mouth at bedtime  multivitamin with minerals tablet  No  Sig: Take 1 tablet by mouth once daily  tadalafiL  (CIALIS ) 20 MG tablet  No  Sig: Take 20 mg by mouth once daily    Facility-Administered Medications: None     Review of Systems  Patient unable to complete review of systems due to altered mental status.  Physical Exam    Current Vital Signs 24h Vital Sign Ranges  T 36.2 C (97.2 F) (12/16/23 2140) Temp  Avg: 36.4 C (97.5 F)  Min: 36.2 C (97.2 F)  Max: 36.6 C (97.8 F)  BP (!) 163/81 (12/16/23 2140) BP  Min: 119/70  Max: 163/81  HR  71 (12/16/23 2140) Pulse  Avg: 72.3  Min: 66  Max: 82  RR 21 (12/16/23 2140) Resp  Avg: 26.2  Min: 18  Max: 34  O2sat 97 % Nasal cannula (12/16/23 2140) SpO2  Avg: 96.8 %  Min: 95 %  Max: 99 %  Weight (!) 105.4 kg (232 lb 5.8 oz) (12/16/23 1625)   Body mass index is 34.82 kg/m. General: in NAD Eyes: conjunctiva clear, anicteric sclera HENT: oropharynx clear, moist mucous membranes CV: regular rate and rhythm, without murmurs, rubs or gallops Resp: good air exchange, bronchial breath sounds throughout Abd: soft, nontender, nondistended, normoactive bowel sounds  Rectal: deferred Ext: no lower extremity edema Skin: no rashes or lesions Neuro: tracks, does not follow commands, non-verbal  Data   Recent Results (from the past 24 hours)  ECG 12-lead   Collection Time: 12/16/23 12:32 PM  Result Value Ref Range   Vent Rate (bpm) 60    PR Interval (msec) 146    QRS Interval (msec) 88    QT  Interval (msec) 408    QTc (msec) 408   Shock Panel, Venous   Collection Time: 12/16/23  1:33 PM  Result Value Ref Range   Patient Temperature, Venous 37.0 C   FIO2, Venous     pH, Venous 7.28 (L) 7.32 - 7.42   PCO2, Venous 57 (H) 39 - 55 mmHg   PO2, Venous 61 (H) 30 - 55 mmHg   Base Excess, Venous -1 -3 - 3 mmol/L   Bicarbonate, Venous 27 20 - 28 mmol/L   CO2 TOTAL, VENOUS 29 21 - 30 mmol/L   Hemoglobin, Venous 9.4 (L) 13.7 - 17.3 g/dL   Hematocrit, Venous 71.9 (L) 39.0 - 49.0 %   % O2 Hemoglobin, Venous 89.8 (H) 60.0 - 85.0 %   % CO Hemoglobin, Venous 2.3 (H) <=2.0 %   % Methemoglobin, Venous 0.6 0.4 - 1.5 %   Volume % O2, Venous 11.9 7.0 - 18.0 %   Sodium, Venous 129 (L) 135 - 145 mmol/L   Potassium, Venous 4.5 3.2 - 4.8 mmol/L   Glucose, Nonfasting, Venous 109 70 - 140 mg/dL   Calcium , Ionized 1.14 (L) 1.15 - 1.32 mmol/L   Lactate, Venous 1.0 0.6 - 2.2 mmol/L  Comprehensive Metabolic Panel (CMP)   Collection Time: 12/16/23  1:33 PM  Result Value Ref Range   Sodium 131 (L) 135 - 145  mmol/L   Potassium 4.4 3.5 - 5.0 mmol/L   Chloride 93 (L) 98 - 108 mmol/L   Carbon Dioxide (CO2) 24 21 - 30 mmol/L   Urea Nitrogen (BUN) 101 (H) 7 - 20 mg/dL   Creatinine 6.5 (H) 0.6 - 1.3 mg/dL   Glucose 884 70 - 859 mg/dL   Calcium  8.4 (L) 8.7 - 10.2 mg/dL   AST (Aspartate Aminotransferase) 36 15 - 41 U/L   ALT (Alanine Aminotransferase) 25 15 - 50 U/L   Bilirubin, Total 0.4 0.4 - 1.5 mg/dL   Alk Phos (Alkaline Phosphatase) 102 24 - 110 U/L   Albumin  1.7 (L) 3.5 - 4.8 g/dL   Protein, Total 8.2 (H) 6.2 - 8.1 g/dL   Anion Gap 14 (H) 3 - 12 mmol/L   BUN/CREA Ratio 16 6 - 27   Glomerular Filtration Rate (eGFR)  8 mL/min/1.73sq m  Complete Blood Count (CBC) with Differential   Collection Time: 12/16/23  1:33 PM  Result Value Ref Range   WBC (White Blood Cell Count) 11.5 (H) 3.2 - 9.8 x10^9/L   Hemoglobin 9.1 (L) 13.7 - 17.3 g/dL   Hematocrit 70.5 (L) 60.9 - 49.0 %   Platelets 407 150 - 450 x10^9/L   MCV (Mean Corpuscular Volume) 92 80 - 98 fL   MCH (Mean Corpuscular Hemoglobin) 28.5 26.5 - 34.0 pg   MCHC (Mean Corpuscular Hemoglobin Concentration) 31.0 (L) 31.5 - 36.3 %   RBC (Red Blood Cell Count) 3.19 (L) 4.37 - 5.74 x10^12/L   RDW-CV (Red Cell Distribution Width) 16.1 (H) 11.5 - 14.5 %   NRBC (Nucleated Red Blood Cell Count) 0.00 0 x10^9/L   NRBC % (Nucleated Red Blood Cell %) 0.0 %   MPV (Mean Platelet Volume) 10.1 7.2 - 11.7 fL   Neutrophil Count 9.0 (H) 2.0 - 8.6 x10^9/L   Neutrophil % 78.6 37 - 80 %   Lymphocyte Count 0.6 0.6 - 4.2 x10^9/L   Lymphocyte % 5.6 (L) 10 - 50 %   Monocyte Count 1.3 (H) 0 - 0.9 x10^9/L   Monocyte % 11.7 0 - 12 %  Eosinophil Count 0.27 0 - 0.70 x10^9/L   Eosinophil % 2.3 0 - 7 %   Basophil Count 0.06 0 - 0.20 x10^9/L   Basophil % 0.5 0 - 2 %   Immature Granulocyte Count 0.15 (H) <=0.06 x10^9/L   Immature Granulocyte % 1.3 (H) <=0.7 %  Prothrombin Time (INR)   Collection Time: 12/16/23  1:33 PM  Result Value Ref Range   Prothrombin Time  12.0 9.5 - 13.1 sec   Prothrombin INR 1.0 0.9 - 1.1  Activated Partial Thromboplastin Time (APTT)   Collection Time: 12/16/23  1:33 PM  Result Value Ref Range   Act Partial Thromboplastin Time 27.1 26.8 - 37.1 sec  POC GLUCOSE   Collection Time: 12/16/23  1:35 PM  Result Value Ref Range   POC Glucose 122 70 - 140 mg/dL  POC Coronavirus (RNCPI-80) SARS-Cov-2 Rapid Test   Collection Time: 12/16/23  2:13 PM  Result Value Ref Range   POC Coronavirus (COVID-19) SARS-CoV-2 Rapid Test Not Detected Not Detected  POC Influenza A and/or B Rapid Assay   Collection Time: 12/16/23  2:13 PM  Result Value Ref Range   POC Influenza A RNA Not Detected Not Detected   POC Influenza B RNA Not Detected Not Detected  Prothrombin Time (INR)   Collection Time: 12/16/23  6:25 PM  Result Value Ref Range   Prothrombin Time 12.2 9.5 - 13.1 sec   Prothrombin INR 1.1 0.9 - 1.1  POC GLUCOSE   Collection Time: 12/16/23  9:49 PM  Result Value Ref Range   POC Glucose 102 70 - 140 mg/dL    EKG: sinus rhythm, normal axis, early repolarization, no ST changes  Radiology Studies on Admission: EEG adult Result Date: 12/11/2023 Michaela Aisha SQUIBB, MD 12/12/2023 6:03 PM History: 83 year old male with encephalopathy with EEG being performed to rule out any type of seizure activity contributing EEG Duration: 23 minutes Sedation: none Patient State: Awake and asleep Technique: This EEG was acquired with electrodes placed according to the International 10-20 electrode system (including Fp1, Fp2, F3, F4, C3, C4, P3, P4, O1, O2, T3, T4, T5, T6, A1, A2, Fz, Cz, Pz). The following electrodes were missing or displaced: none. Background: The background consists of generalized intermixed irregular delta and theta range activities. With arousal, there is an increase in faster frequencies, but no definite posterior dominant rhythm is identified. There were no epileptiform discharges. Photic stimulation: Physiologic driving is not  performed EEG Abnormalities: 1) generalized irregular slow activity 2) absent posterior dominant rhythm Clinical Interpretation: This normal EEG is recorded in the waking and sleep state. There was no seizure or seizure predisposition recorded on this study. Please note that lack of epileptiform activity on EEG does not preclude the possibility of epilepsy. Aisha Michaela, MD [facility] If 7pm- 7am, please page neurology on call as listed in AMION.  CT ABDOMEN PELVIS WO CONTRAST Result Date: 12/04/2023 CLINICAL DATA: Persistent leukocytosis. Infection of uncertain etiology. EXAM: CT ABDOMEN AND PELVIS WITHOUT CONTRAST TECHNIQUE: Multidetector CT imaging of the abdomen and pelvis was performed following the standard protocol without IV contrast. RADIATION DOSE REDUCTION: This exam was performed according to the departmental dose-optimization program which includes automated exposure control, adjustment of the mA and/or kV according to patient size and/or use of iterative reconstruction technique. COMPARISON: CT abdomen pelvis dated 11/10/2023. FINDINGS: Evaluation of this exam is limited in the absence of intravenous contrast as well as due to respiratory motion. Lower chest: Partially visualized small bilateral pleural effusions. Large areas  of consolidation involving the lower lobes bilaterally, progressed since the prior CT may represent atelectasis or infiltrate. There is 3 vessel coronary vascular calcification. No intra-abdominal free air. Hepatobiliary: The liver is unremarkable. No biliary dilatation. The gallbladder is contracted. Several gallstones. Pancreas: Unremarkable. No pancreatic ductal dilatation or surrounding inflammatory changes. Spleen: Normal in size without focal abnormality. Adrenals/Urinary Tract: The adrenal glands are unremarkable similar or slightly increased bilateral hydronephrosis and hydroureter. There is high attenuating content within the renal collecting systems  bilaterally indicative of proteinaceous or hemorrhagic content. No stone. There is a moderate size left perinephric hematoma, new since the prior CT and measuring approximately 5.5 x 14 cm in greatest axial dimensions. Interval increase in the right perinephric stranding. The urinary bladder is collapsed. Stomach/Bowel: Percutaneous gastrostomy with balloon in the body of the stomach. There is loose stool throughout the colon. There is no bowel obstruction. The appendix is normal. Vascular/Lymphatic: Mild aortoiliac atherosclerotic disease. The IVC is unremarkable. No portal venous gas. There is no adenopathy. Reproductive: The prostate is grossly unremarkable. Other: None Musculoskeletal: Osteopenia with degenerative changes of the spine. No acute osseous pathology. IMPRESSION: 1. Moderate size left perinephric hematoma, new since the prior CT. 2. Similar or slightly increased bilateral hydronephrosis and hydroureter. High attenuating content within the renal collecting systems bilaterally indicative of proteinaceous or hemorrhagic content. No stone. 3. Percutaneous gastrostomy with balloon in the body of the stomach. No bowel obstruction. Normal appendix. 4. Cholelithiasis. 5. Partially visualized small bilateral pleural effusions. Large areas of consolidation involving the lower lobes bilaterally, progressed since the prior CT may represent atelectasis or infiltrate. 6. Aortic Atherosclerosis (ICD10-I70.0). Electronically Signed By: Vanetta Chou M.D. On: 12/04/2023 10:42   MR BRAIN W WO CONTRAST Result Date: 12/03/2023 CLINICAL DATA: Provided history: Mental status change, unknown cause. EXAM: MRI HEAD WITHOUT AND WITH CONTRAST TECHNIQUE: Multiplanar, multiecho pulse sequences of the brain and surrounding structures were obtained without and with intravenous contrast. CONTRAST: 10mL GADAVIST  GADOBUTROL  1 MMOL/ML IV SOLN COMPARISON: Head CT 10/11/2023. Brain MRI 08/16/2021. FINDINGS: Brain: Generalized  cerebral atrophy. Prominence of the ventricles and sulci, which appears commensurate. Multifocal T2 FLAIR hyperintense signal abnormality within the cerebral white matter, nonspecific but compatible with moderate chronic small vessel ischemic disease. There are a few nonspecific chronic microhemorrhages scattered within the supratentorial brain. There is no acute infarct. No evidence of an intracranial mass. No extra-axial fluid collection. No midline shift. No pathologic intracranial enhancement identified. Vascular: Maintained flow voids within the proximal large arterial vessels. Skull and upper cervical spine: No focal worrisome marrow lesion. Sinuses/Orbits: No mass or acute finding within the imaged orbits. Mild mucosal thickening within the right maxillary and right sphenoid sinuses. Other: Trace fluid within left mastoid air cells. IMPRESSION: 1. No evidence of an acute intracranial abnormality. 2. Moderate chronic small vessel ischemic changes within the cerebral white matter, similar to the prior MRI of 08/16/2021. 3. Few nonspecific chronic microhemorrhages within the supratentorial brain. 4. Generalized cerebral atrophy. 5. Mild mucosal thickening within the right maxillary and right sphenoid sinuses. Electronically Signed By: Rockey Childs D.O. On: 12/03/2023 13:41   DG ABDOMEN PEG TUBE LOCATION Result Date: 11/28/2023 CLINICAL DATA: Check of gastrostomy tube positioning after injection of contrast. EXAM: ABDOMEN - 1 VIEW COMPARISON: CT of abdomen on 11/26/2023 FINDINGS: Contrast was injected via a pre-existing gastrostomy tube. Contrast is present within the stomach and duodenum at the time of the radiograph. A retention balloon is present at the level of the distal stomach/proximal duodenum as seen  on the CT. The tubing of the gastrostomy tube itself is difficult to visualize on the x-ray. It appears likely that the retention balloon has migrated distally. IMPRESSION: Contrast injected via the  gastrostomy tube is present within the stomach and duodenum at the time of the radiograph. A retention balloon is present at the level of the distal stomach/proximal duodenum as seen on the CT. The tubing of the gastrostomy tube itself is difficult to visualize on the x-ray. It appears likely that the retention balloon has migrated distally. Recommend retraction of the gastrostomy tube until the retention balloon is felt at the level of the anterior gastric wall. Electronically Signed By: Marcey Moan M.D. On: 11/28/2023 09:26   CT ABDOMEN PELVIS WO CONTRAST Result Date: 11/26/2023 CLINICAL DATA: Dyspnea abdominal pain, fevers, tachypnea, distention EXAM: CT CHEST, ABDOMEN AND PELVIS WITHOUT CONTRAST TECHNIQUE: Multidetector CT imaging of the chest, abdomen and pelvis was performed following the standard protocol without IV contrast. RADIATION DOSE REDUCTION: This exam was performed according to the departmental dose-optimization program which includes automated exposure control, adjustment of the mA and/or kV according to patient size and/or use of iterative reconstruction technique. COMPARISON: 11/02/2023 FINDINGS: CT CHEST FINDINGS Cardiovascular: Large-bore right neck multi lumen vascular catheter. Normal heart size. Three-vessel coronary artery calcifications no pericardial effusion. Mediastinum/Nodes: No enlarged mediastinal, hilar, or axillary lymph nodes. Thyroid  gland, trachea, and esophagus demonstrate no significant findings. Lungs/Pleura: Small bilateral pleural effusions and associated atelectasis or consolidation, similar to prior examination. Musculoskeletal: No chest wall abnormality. No acute osseous findings. CT ABDOMEN PELVIS FINDINGS Hepatobiliary: No solid liver abnormality is seen. Tiny gallstones. No gallbladder wall thickening, or biliary dilatation. Pancreas: Unremarkable. No pancreatic ductal dilatation or surrounding inflammatory changes. Spleen: Normal in size without significant  abnormality. Adrenals/Urinary Tract: Adrenal glands are unremarkable. Moderate bilateral hydronephrosis and hydroureter to the ureterovesicular junctions without calculus or other visible obstruction. Interval increase in extensive bilateral simple attenuation perinephric fluid (series 2, image 80). Severe wall thickening of the decompressed urinary bladder. Stomach/Bowel: Percutaneous gastrostomy. Appendix not clearly visualized. No evidence of bowel wall thickening, distention, or inflammatory changes. Sigmoid diverticulosis. Vascular/Lymphatic: Aortic atherosclerosis. No enlarged abdominal or pelvic lymph nodes. Reproductive: No mass or other abnormality. Other: No abdominal wall hernia. Anasarca. No ascites. Musculoskeletal: No acute osseous findings. IMPRESSION: 1. Moderate bilateral hydronephrosis and hydroureter to the ureterovesicular junctions without calculus or other visible obstruction likely secondary to ureterovesicular junction due to bladder wall edema. 2. Interval increase in extensive bilateral simple attenuation perinephric fluid. 3. Pleural effusion similar to prior examination. Anasarca. 4. Cholelithiasis. 5. Percutaneous gastrostomy. 6. Coronary artery disease. Aortic Atherosclerosis (ICD10-I70.0). Electronically Signed By: Marolyn JONETTA Jaksch M.D. On: 11/26/2023 19:16   CT CHEST WO CONTRAST Result Date: 11/26/2023 CLINICAL DATA: Dyspnea abdominal pain, fevers, tachypnea, distention EXAM: CT CHEST, ABDOMEN AND PELVIS WITHOUT CONTRAST TECHNIQUE: Multidetector CT imaging of the chest, abdomen and pelvis was performed following the standard protocol without IV contrast. RADIATION DOSE REDUCTION: This exam was performed according to the departmental dose-optimization program which includes automated exposure control, adjustment of the mA and/or kV according to patient size and/or use of iterative reconstruction technique. COMPARISON: 11/02/2023 FINDINGS: CT CHEST FINDINGS Cardiovascular: Large-bore  right neck multi lumen vascular catheter. Normal heart size. Three-vessel coronary artery calcifications no pericardial effusion. Mediastinum/Nodes: No enlarged mediastinal, hilar, or axillary lymph nodes. Thyroid  gland, trachea, and esophagus demonstrate no significant findings. Lungs/Pleura: Small bilateral pleural effusions and associated atelectasis or consolidation, similar to prior examination. Musculoskeletal: No chest wall abnormality. No acute  osseous findings. CT ABDOMEN PELVIS FINDINGS Hepatobiliary: No solid liver abnormality is seen. Tiny gallstones. No gallbladder wall thickening, or biliary dilatation. Pancreas: Unremarkable. No pancreatic ductal dilatation or surrounding inflammatory changes. Spleen: Normal in size without significant abnormality. Adrenals/Urinary Tract: Adrenal glands are unremarkable. Moderate bilateral hydronephrosis and hydroureter to the ureterovesicular junctions without calculus or other visible obstruction. Interval increase in extensive bilateral simple attenuation perinephric fluid (series 2, image 80). Severe wall thickening of the decompressed urinary bladder. Stomach/Bowel: Percutaneous gastrostomy. Appendix not clearly visualized. No evidence of bowel wall thickening, distention, or inflammatory changes. Sigmoid diverticulosis. Vascular/Lymphatic: Aortic atherosclerosis. No enlarged abdominal or pelvic lymph nodes. Reproductive: No mass or other abnormality. Other: No abdominal wall hernia. Anasarca. No ascites. Musculoskeletal: No acute osseous findings. IMPRESSION: 1. Moderate bilateral hydronephrosis and hydroureter to the ureterovesicular junctions without calculus or other visible obstruction likely secondary to ureterovesicular junction due to bladder wall edema. 2. Interval increase in extensive bilateral simple attenuation perinephric fluid. 3. Pleural effusion similar to prior examination. Anasarca. 4. Cholelithiasis. 5. Percutaneous gastrostomy. 6. Coronary  artery disease. Aortic Atherosclerosis (ICD10-I70.0). Electronically Signed By: Marolyn JONETTA Jaksch M.D. On: 11/26/2023 19:16   DG Chest 1 View Result Date: 11/25/2023 CLINICAL DATA: Shortness of breath EXAM: PORTABLE CHEST 1 VIEW COMPARISON: Film from earlier in the same day. FINDINGS: Dialysis catheter is again identified. Cardiac shadow is prominent but stable. Patient rotation accentuates the mediastinal markings. Central vascular congestion is noted with mild interstitial edema. Stable left basilar atelectasis is noted. IMPRESSION: Mild changes of CHF. Left basilar atelectasis. Electronically Signed By: Oneil Devonshire M.D. On: 11/25/2023 22:29   DG Chest Port 1 View Result Date: 11/25/2023 CLINICAL DATA: Dyspnea. Weakness EXAM: PORTABLE CHEST 1 VIEW COMPARISON: 11/11/2023 FINDINGS: Interval placement of a dual lumen dialysis catheter. Tips in the distal SVC. Stable cardiac silhouette. There is new a peripheral interstitial linear markings consistent interstitial edema. LEFT basilar atelectasis and small effusion. No pneumothorax. IMPRESSION: 1. Interval placement of dialysis catheter. 2. Interstitial edema. 3. LEFT basilar atelectasis and small effusion. Electronically Signed By: Jackquline Boxer M.D. On: 11/25/2023 18:57   CT brain without contrast Result Date: 12/16/2023 CT BRAIN WITHOUT CONTRAST INDICATION: Mental status change, unknown cause, R41.82 Altered mental status, unspecified COMPARISON: None. TECHNIQUE: Standard noncontrast brain CT. FINDINGS: Brain Parenchyma: There is no hemorrhage, cerebral edema, acute cortical infarction, mass, mass effect, or midline shift. Scattered periventricular and subcortical white matter hypodensities without mass effect likely reflect sequelae of chronic small vessel ischemic disease. Moderate generalized volume loss. Ventricles and Sulci: Prominent lateral ventricles of asymmetrically enlarged right lateral ventricle, likely secondary to central volume loss.  Extra-Axial Spaces: No extra-axial fluid collection. Basal Cisterns: Normal. Paranasal Sinuses: Mild right sphenoid sinus mucosal thickening and air fluid level. Mastoids: Normal mastoid. Soft tissue in the bilateral external auditory canals. Orbits: Bilateral lens replacements. Cranium and Bones: Normal. Soft Tissues: Normal. IMPRESSION: 1.  No acute intracranial abnormalities. 2.  Right sphenoid sinus air fluid level, can be seen with acute sinusitis. 3.  Prominent soft tissue density in the bilateral external auditory canals, likely cerumen. Correlate with direct visual inspection. Electronically Reviewed by:  Sandrea Helm, MD, [facility] Electronically Reviewed on:  12/16/2023 3:22 PM I have reviewed the images and concur with the above findings. Electronically Signed by:  Gustav Bales, MD, [facility] Electronically Signed on:  12/16/2023 4:38 PM  X-ray chest PA and lateral Result Date: 12/16/2023 XR CHEST PA AND LATERAL INDICATION: Lung Aeration, R41.82 Altered mental status, unspecified COMPARISON: None FINDINGS: 1.  Double-lumen right IJ catheter appears to terminate in the cavoatrial junction. 2.  Cardiomediastinal contours are enlarged and may be exaggerated secondary to low lung volumes and AP film. 3.  There are bilateral lower lobe opacities which could be secondary to atelectasis related to low lung volumes however cannot rule out infective process. 4.  There are also mild intersitial opacities reflecting a component of edema. IMPRESSION: 1.  Bilateral lower lobe opacities which may represent atelectasis versus infection. 2.  Interstitial opacities likely reflecting mild pulmonary edema. Electronically Reviewed by:  Mabel Layer, MD, [facility] Electronically Reviewed on:  12/16/2023 2:49 PM I have reviewed the images and concur with the above findings. Electronically Signed by:  Comer Shoe, MD, [facility] Electronically Signed on:  12/16/2023 3:03 PM   Assessment & Plan   Robert Vega is a 83 y.o. male admitted for the following problems: Active Problems:   * No active hospital problems. *    # Toxic metabolic encephalopathy # Uremia Patient with waxing and waning mental status throughout multi-month hospital stay. Seen by neuro and felt 2/2 acute medical illness. Persistently uremic to 100s despite initiation of HD. CT head without intracranial findings. At best, patient can state his name and follow simply commands. On my interview, tracked but did not answer questions.  - iHD as below - delirium precautions  # ESRD on iHD Admitted to OSH with AKI which developed in ESRD and was started on iHD, last session 7/20. Nephrology consulted and planning for next HD tomorrow 7/22. - follow-up CTAP for eval of urinary obstruction - I&O to monitor for renal recovery - iHD per renal  # Leukocytosis # Air fluid level in R sphenoid sinus Patient has had persistently elevated WBC at OSH and multiple courses abx. Finding on CT head concerning for possible sinusitis. Will hold abx for now. - trend WBC - will assess sinus symptoms when patient able to discuss  # Dysphagia # Achalasia s/p PEG Was undergoing extended outpatient work-up. At OSH, had botox  injection to LES on 5/20. Esophagram 5/21 showed diffusely distended esophagus with distal obstruction and severe dysmotility suggestive of achalasia. He underwent PEG tube placement 5/13. - continue omeprazole  - recommended for POEM outpatient pending clinical course  # Bilateral soft tissue density in external auditory canal c/f cerumen Would benefit from removal if possible to help with delirium.   # Malnutrition # PEG dependent # Diarrhea thought 2/2 tube feeding PEG placed 5/21 then removed by patient and replaced surgically.  - nutrition consulted - renal tube feeding  # Anemia Hgb 7-8 per [facility] at OSH.  - follow-up anemia work-up - trend Hgb  # HTN - hold home diltiazem  for now  pending further dialysis  # Disposition Per OSH notes, granddaughter [name] not interested in hospice at this time. Was recommended for LTC.  - PT/OT consult    Comorbid Conditions: Nutritional Disorders:    Hypoalbuminemia:  Hypoalbuminemia present with lowest albumin  of 1.7.   Hypoalbuminemia is associated with increased risk for patients.  We will attempt to treat the underlying condition(s) contributing to this low albumin  state. Electrolyte Disorders:    Hyponatremia:  Hyponatremia present with lowest sodium of 129.  Will continue to monitor.   Hematologic Disorders:    Anemia:  Anemia present with lowest hemoglobin of 9.1.  Will continue to monitor.       VTE Prophylaxis  + Anticoagulant Ordered  heparin  (porcine), , Intracatheter, As Directed Admin [START ON 12/17/2023] heparin  (porcine), 5,000  Units, Subcutaneous, Q8H SCH     Total time including chart review, patient interview, documentation: 1.5hr  Code Status: Full Code  Patient Class & Status: Inpatient, Intermediate  Discharge Planning: Current Ambulatory Status: unclear Anticipated Discharge Needs: PT and OT Anticipated Discharge Needs: TBD    ANDRIETTE EARL, MD  [facility] 12/16/2023 11:13 PM

## 2023-12-16 NOTE — Progress Notes (Addendum)
 [facility]  ROUNDING NOTE   Subjective:   Patient was seen laying, quietly Alert, delayed responds to simple questions today 3L [place]  Notified of desire for family meeting, unable to accommodate 8:30am request. Awaiting request for later time of meeting.  Objective:  Vital signs in last 24 hours:  Temp:  [97 F (36.1 C)-98 F (36.7 C)] 97.6 F (36.4 C) (07/21 0751) Pulse Rate:  [74-80] 80 (07/21 0751) Resp:  [18-25] 25 (07/21 0751) BP: (122-140)/(64-75) 122/68 (07/21 0751) SpO2:  [97 %-100 %] 97 % (07/21 0751)  Weight change:  Filed Weights   12/12/23 0838 12/12/23 1200  Weight: 95.1 kg 94.1 kg    Intake/Output: I/O last 3 completed shifts: In: 840 [NG/GT:840] Out: -    Intake/Output this shift:  Total I/O In: 120 [NG/GT:120] Out: -   Physical Exam: General: Chronically ill appearing  Head: Oral mucosa moist  Eyes: Anicteric  Lungs:  Diminished  Heart: Regular rate and rhythm  Abdomen:  Peg tube in place, +distended  Extremities: no peripheral edema.  Neurologic: Awake, aert  Skin: No lesions  Access: Rt internal jugular permcath    Basic Metabolic Panel: Recent Labs  Lab 12/11/23 0421 12/12/23 0505 12/13/23 0517 12/14/23 0945 12/16/23 0421  NA 133* 133* 134* 133* 132*  K 4.0 4.1 4.0 3.9 4.1  CL 95* 95* 96* 94* 92*  CO2 24 24 27 27 25   GLUCOSE 109* 114* 112* 128* 105*  BUN 78* 92* 51* 85* 90*  CREATININE 5.88* 7.28* 4.65* 6.36* 5.82*  CALCIUM  8.1* 8.3* 8.1* 8.1* 8.3*  PHOS  --   --   --  5.7*  --     Liver Function Tests: Recent Labs  Lab 12/10/23 0334 12/14/23 0945  AST 24  --   ALT 17  --   ALKPHOS 76  --   BILITOT 0.4  --   PROT 7.7  --   ALBUMIN  2.1* 1.9*    No results for input(s): LIPASE, AMYLASE in the last 168 hours. Recent Labs  Lab 12/10/23 2234  AMMONIA 54*    CBC: Recent Labs  Lab 12/11/23 0421 12/12/23 0505 12/13/23 0517 12/14/23 0945 12/16/23 0421  WBC 13.1* 13.1* 14.5* 13.3* 10.6*   NEUTROABS  --  10.1* 11.0*  --   --   HGB 8.2* 8.0* 8.1* 8.3* 8.5*  HCT 26.4* 25.2* 25.3* 26.6* 27.8*  MCV 90.7 92.0 91.3 92.7 91.7  PLT 380 358 347 376 373    Cardiac Enzymes: No results for input(s): CKTOTAL, CKMB, CKMBINDEX, TROPONINI in the last 168 hours.  BNP: Invalid input(s): POCBNP  CBG: Recent Labs  Lab 12/14/23 2013 12/15/23 0753 12/15/23 1206 12/15/23 1833 12/16/23 0752  GLUCAP 126* 114* 125* 140* 106*    Microbiology: Results for orders placed or performed during the hospital encounter of 10/05/23  Blood Culture (routine x 2)     Status: None   Collection Time: 10/05/23  5:06 AM   Specimen: BLOOD  Result Value Ref Range Status   Specimen Description BLOOD LA  Final   Special Requests   Final    BOTTLES DRAWN AEROBIC AND ANAEROBIC Blood Culture results may not be optimal due to an inadequate volume of blood received in culture bottles   Culture   Final    NO GROWTH 5 DAYS Performed at [facility], [address], [place], [place] [address]    Report Status 10/10/2023 FINAL  Final  Blood Culture (routine x 2)  Status: None   Collection Time: 10/05/23  5:07 AM   Specimen: BLOOD  Result Value Ref Range Status   Specimen Description BLOOD RA  Final   Special Requests   Final    BOTTLES DRAWN AEROBIC AND ANAEROBIC Blood Culture results may not be optimal due to an inadequate volume of blood received in culture bottles   Culture   Final    NO GROWTH 5 DAYS Performed at [facility], [address] [place], [place] [address]    Report Status 10/10/2023 FINAL  Final  Resp panel by RT-PCR (RSV, Flu A&B, Covid) Anterior Nasal Swab     Status: None   Collection Time: 10/05/23  5:56 AM   Specimen: Anterior Nasal Swab  Result Value Ref Range Status   SARS Coronavirus 2 by RT PCR NEGATIVE NEGATIVE Final    Comment: (NOTE) SARS-CoV-2 target nucleic acids are NOT DETECTED.  The SARS-CoV-2 RNA is generally detectable in upper  respiratory specimens during the acute phase of infection. The lowest concentration of SARS-CoV-2 viral copies this assay can detect is 138 copies/mL. A negative result does not preclude SARS-Cov-2 infection and should not be used as the sole basis for treatment or other patient management decisions. A negative result may occur with  improper specimen collection/handling, submission of specimen other than nasopharyngeal swab, presence of viral mutation(s) within the areas targeted by this assay, and inadequate number of viral copies(<138 copies/mL). A negative result must be combined with clinical observations, patient history, and epidemiological information. The expected result is Negative.  Fact Sheet for Patients:  BloggerCourse.com  Fact Sheet for Healthcare Providers:  SeriousBroker.it  This test is no t yet approved or cleared by the [place]  FDA and  has been authorized for detection and/or diagnosis of SARS-CoV-2 by FDA under an Emergency Use Authorization (EUA). This EUA will remain  in effect (meaning this test can be used) for the duration of the COVID-19 declaration under Section 564(b)(1) of the Act, 21 U.S.C.section 360bbb-3(b)(1), unless the authorization is terminated  or revoked sooner.       Influenza A by PCR NEGATIVE NEGATIVE Final   Influenza B by PCR NEGATIVE NEGATIVE Final    Comment: (NOTE) The Xpert Xpress SARS-CoV-2/FLU/RSV plus assay is intended as an aid in the diagnosis of influenza from Nasopharyngeal swab specimens and should not be used as a sole basis for treatment. Nasal washings and aspirates are unacceptable for Xpert Xpress SARS-CoV-2/FLU/RSV testing.  Fact Sheet for Patients: BloggerCourse.com  Fact Sheet for Healthcare Providers: SeriousBroker.it  This test is not yet approved or cleared by the [place]  FDA and has been  authorized for detection and/or diagnosis of SARS-CoV-2 by FDA under an Emergency Use Authorization (EUA). This EUA will remain in effect (meaning this test can be used) for the duration of the COVID-19 declaration under Section 564(b)(1) of the Act, 21 U.S.C. section 360bbb-3(b)(1), unless the authorization is terminated or revoked.     Resp Syncytial Virus by PCR NEGATIVE NEGATIVE Final    Comment: (NOTE) Fact Sheet for Patients: BloggerCourse.com  Fact Sheet for Healthcare Providers: SeriousBroker.it  This test is not yet approved or cleared by the [place]  FDA and has been authorized for detection and/or diagnosis of SARS-CoV-2 by FDA under an Emergency Use Authorization (EUA). This EUA will remain in effect (meaning this test can be used) for the duration of the COVID-19 declaration under Section 564(b)(1) of the Act, 21 U.S.C. section 360bbb-3(b)(1), unless the authorization is terminated or  revoked.  Performed at [facility], [address] [place], [place] [address]   Respiratory (~20 pathogens) panel by PCR     Status: None   Collection Time: 10/05/23  8:11 AM   Specimen: Nasopharyngeal Swab; Respiratory  Result Value Ref Range Status   Adenovirus NOT DETECTED NOT DETECTED Final   Coronavirus 229E NOT DETECTED NOT DETECTED Final    Comment: (NOTE) The Coronavirus on the Respiratory Panel, DOES NOT test for the novel  Coronavirus (2019 nCoV)    Coronavirus HKU1 NOT DETECTED NOT DETECTED Final   Coronavirus NL63 NOT DETECTED NOT DETECTED Final   Coronavirus OC43 NOT DETECTED NOT DETECTED Final   Metapneumovirus NOT DETECTED NOT DETECTED Final   Rhinovirus / Enterovirus NOT DETECTED NOT DETECTED Final   Influenza A NOT DETECTED NOT DETECTED Final   Influenza B NOT DETECTED NOT DETECTED Final   Parainfluenza Virus 1 NOT DETECTED NOT DETECTED Final   Parainfluenza Virus 2 NOT DETECTED NOT DETECTED Final    Parainfluenza Virus 3 NOT DETECTED NOT DETECTED Final   Parainfluenza Virus 4 NOT DETECTED NOT DETECTED Final   Respiratory Syncytial Virus NOT DETECTED NOT DETECTED Final   Bordetella pertussis NOT DETECTED NOT DETECTED Final   Bordetella Parapertussis NOT DETECTED NOT DETECTED Final   Chlamydophila pneumoniae NOT DETECTED NOT DETECTED Final   Mycoplasma pneumoniae NOT DETECTED NOT DETECTED Final    Comment: Performed at [facility], [address] [place], [place] [address]  Expectorated Sputum Assessment w Gram Stain, Rflx to Resp Cult     Status: None   Collection Time: 10/05/23  9:07 AM   Specimen: Sputum  Result Value Ref Range Status   Specimen Description SPUTUM  Final   Special Requests NONE  Final   Sputum evaluation   Final    Sputum specimen not acceptable for testing.  Please recollect.   C/KERRY NELSON AT 1005 10/05/23.PMF Performed at [facility], [address] [place], [place] [address]    Report Status 10/05/2023 FINAL  Final  Expectorated Sputum Assessment w Gram Stain, Rflx to Resp Cult     Status: None   Collection Time: 10/05/23 10:50 AM  Result Value Ref Range Status   Specimen Description EXPECTORATED SPUTUM  Final   Special Requests NONE  Final   Sputum evaluation   Final    THIS SPECIMEN IS ACCEPTABLE FOR SPUTUM CULTURE Performed at [facility], [address], [place], [place] [address]    Report Status 10/05/2023 FINAL  Final  Culture, Respiratory w Gram Stain     Status: None   Collection Time: 10/05/23 10:50 AM  Result Value Ref Range Status   Specimen Description   Final    EXPECTORATED SPUTUM Performed at [facility], [address], [place], [place] [address]    Special Requests   Final    NONE Reflexed from [phone] Performed at [facility], [address] [place], [place] [address]    Gram Stain   Final    RARE WBC SEEN RARE VONNE POSITIVE RODS RARE VONNE POSITIVE COCCI RARE GRAM  NEGATIVE RODS    Culture   Final    FEW Normal respiratory flora-no Staph aureus or Pseudomonas seen Performed at [facility], [address] [place], [place] [address]    Report Status 10/07/2023 FINAL  Final  MRSA Next Gen by PCR, Nasal     Status: None   Collection Time: 10/06/23 12:57 AM   Specimen:  Nasal Mucosa; Nasal Swab  Result Value Ref Range Status   MRSA by PCR Next Gen NOT DETECTED NOT DETECTED Final    Comment: (NOTE) The GeneXpert MRSA Assay (FDA approved for NASAL specimens only), is one component of a comprehensive MRSA colonization surveillance program. It is not intended to diagnose MRSA infection nor to guide or monitor treatment for MRSA infections. Test performance is not FDA approved in patients less than 86 years old. Performed at [facility], [address] [place], [place] [address]   Culture, Respiratory w Gram Stain     Status: None   Collection Time: 10/06/23 11:37 AM   Specimen: INDUCED SPUTUM  Result Value Ref Range Status   Specimen Description   Final    INDUCED SPUTUM Performed at [facility], [address], [place], [place] [address]    Special Requests   Final    NONE Performed at [facility], [address] [place], [place] [address]    Gram Stain   Final    FEW WBC PRESENT,BOTH PMN AND MONONUCLEAR FEW GRAM POSITIVE RODS    Culture   Final    MODERATE LACTOBACILLUS FERMENTUM Standardized susceptibility testing for this organism is not available. Performed at [facility], [address], [place], [place] [address]    Report Status 10/09/2023 FINAL  Final  Culture, blood (x 2)     Status: None   Collection Time: 10/22/23  1:00 AM   Specimen: BLOOD  Result Value Ref Range Status   Specimen Description BLOOD BLOOD RIGHT ARM  Final   Special Requests   Final    BOTTLES DRAWN AEROBIC AND ANAEROBIC Blood Culture adequate volume   Culture   Final    NO GROWTH 5 DAYS Performed at [facility], [address] [place], [place] [address]    Report Status 10/27/2023 FINAL  Final  Culture, blood (x 2)     Status: None   Collection Time: 10/22/23  1:00 AM   Specimen: BLOOD  Result Value Ref Range Status   Specimen Description BLOOD BLOOD RIGHT ARM  Final   Special Requests   Final    BOTTLES DRAWN AEROBIC AND ANAEROBIC Blood Culture adequate volume   Culture   Final    NO GROWTH 5 DAYS Performed at [facility], [address] [place], [place] [address]    Report Status 10/27/2023 FINAL  Final  Resp panel by RT-PCR (RSV, Flu A&B, Covid) Anterior Nasal Swab     Status: None   Collection Time: 11/09/23  5:31 PM   Specimen: Anterior Nasal Swab  Result Value Ref Range Status   SARS Coronavirus 2 by RT PCR NEGATIVE NEGATIVE Final    Comment: (NOTE) SARS-CoV-2 target nucleic acids are NOT DETECTED.  The SARS-CoV-2 RNA is generally detectable in upper respiratory specimens during the acute phase of infection. The lowest concentration of SARS-CoV-2 viral copies this assay can detect is 138 copies/mL. A negative result does not preclude SARS-Cov-2 infection and should not be used as the sole basis for treatment or other patient management decisions. A negative result may occur with  improper specimen collection/handling, submission of specimen other than nasopharyngeal swab, presence of viral mutation(s) within the areas targeted by this assay, and inadequate number of viral copies(<138 copies/mL). A negative result must be combined with clinical observations, patient history, and epidemiological information. The expected result is Negative.  Fact Sheet for Patients:  BloggerCourse.com  Fact Sheet for Healthcare  Providers:  SeriousBroker.it  This test is no t yet approved or cleared by the [place]  FDA and  has been authorized for detection and/or diagnosis of SARS-CoV-2 by FDA under an Emergency  Use Authorization (EUA). This EUA will remain  in effect (meaning this test can be used) for the duration of the COVID-19 declaration under Section 564(b)(1) of the Act, 21 U.S.C.section 360bbb-3(b)(1), unless the authorization is terminated  or revoked sooner.       Influenza A by PCR NEGATIVE NEGATIVE Final   Influenza B by PCR NEGATIVE NEGATIVE Final    Comment: (NOTE) The Xpert Xpress SARS-CoV-2/FLU/RSV plus assay is intended as an aid in the diagnosis of influenza from Nasopharyngeal swab specimens and should not be used as a sole basis for treatment. Nasal washings and aspirates are unacceptable for Xpert Xpress SARS-CoV-2/FLU/RSV testing.  Fact Sheet for Patients: BloggerCourse.com  Fact Sheet for Healthcare Providers: SeriousBroker.it  This test is not yet approved or cleared by the [place]  FDA and has been authorized for detection and/or diagnosis of SARS-CoV-2 by FDA under an Emergency Use Authorization (EUA). This EUA will remain in effect (meaning this test can be used) for the duration of the COVID-19 declaration under Section 564(b)(1) of the Act, 21 U.S.C. section 360bbb-3(b)(1), unless the authorization is terminated or revoked.     Resp Syncytial Virus by PCR NEGATIVE NEGATIVE Final    Comment: (NOTE) Fact Sheet for Patients: BloggerCourse.com  Fact Sheet for Healthcare Providers: SeriousBroker.it  This test is not yet approved or cleared by the [place]  FDA and has been authorized for detection and/or diagnosis of SARS-CoV-2 by FDA under an Emergency Use Authorization (EUA). This EUA will remain in effect (meaning this test can be used) for the duration of the COVID-19 declaration under Section 564(b)(1) of the Act, 21 U.S.C. section 360bbb-3(b)(1), unless the authorization is terminated or revoked.  Performed at [facility], [address] [place], [place] [address]   Respiratory (~20 pathogens) panel by PCR     Status: None   Collection Time: 11/09/23  5:31 PM   Specimen: Nasopharyngeal Swab; Respiratory  Result Value Ref Range Status   Adenovirus NOT DETECTED NOT DETECTED Final   Coronavirus 229E NOT DETECTED NOT DETECTED Final    Comment: (NOTE) The Coronavirus on the Respiratory Panel, DOES NOT test for the novel  Coronavirus (2019 nCoV)    Coronavirus HKU1 NOT DETECTED NOT DETECTED Final   Coronavirus NL63 NOT DETECTED NOT DETECTED Final   Coronavirus OC43 NOT DETECTED NOT DETECTED Final   Metapneumovirus NOT DETECTED NOT DETECTED Final   Rhinovirus / Enterovirus NOT DETECTED NOT DETECTED Final   Influenza A NOT DETECTED NOT DETECTED Final   Influenza B NOT DETECTED NOT DETECTED Final   Parainfluenza Virus 1 NOT DETECTED NOT DETECTED Final   Parainfluenza Virus 2 NOT DETECTED NOT DETECTED Final   Parainfluenza Virus 3 NOT DETECTED NOT DETECTED Final   Parainfluenza Virus 4 NOT DETECTED NOT DETECTED Final   Respiratory Syncytial Virus NOT DETECTED NOT DETECTED Final   Bordetella pertussis NOT DETECTED NOT DETECTED Final   Bordetella Parapertussis NOT DETECTED NOT DETECTED Final   Chlamydophila pneumoniae NOT DETECTED NOT DETECTED Final   Mycoplasma pneumoniae NOT DETECTED NOT DETECTED Final    Comment: Performed at [facility], [address], [place], [place] [address]  Culture, blood (Routine X 2) w Reflex to ID Panel     Status: None   Collection Time: 11/09/23  6:16  PM   Specimen: BLOOD  Result Value Ref Range Status   Specimen Description   Final    BLOOD RIGHT ANTECUBITAL Performed at [facility], [address] [place], [place] [address]    Special Requests   Final    BOTTLES DRAWN AEROBIC ONLY Blood Culture adequate volume Performed at [facility], [address], [place], [place] [address]    Culture   Final    NO GROWTH 11 DAYS Performed at [facility], [address] [place], [place] [address]    Report Status 11/20/2023 FINAL  Final  Culture, blood (Routine X 2) w Reflex to ID Panel     Status: None   Collection Time: 11/09/23  6:23 PM   Specimen: BLOOD  Result Value Ref Range Status   Specimen Description   Final    BLOOD BLOOD LEFT FOREARM Performed at [facility], [address] [place] [address] [place], [place] [address]    Special Requests   Final    BOTTLES DRAWN AEROBIC AND ANAEROBIC Blood Culture adequate volume Performed at [facility], [address], [place], [place] [address]    Culture   Final    NO GROWTH 11 DAYS Performed at [facility], [address] [place], [place] [address]    Report Status 11/20/2023 FINAL  Final  Urine Culture     Status: Abnormal   Collection Time: 11/10/23  1:58 AM   Specimen: Urine, Random  Result Value Ref Range Status   Specimen Description   Final    URINE, RANDOM Performed at [facility], [address] [place], [place] [address]    Special Requests   Final    NONE Reflexed from [phone] Performed at [facility], [address] [place], [place] [address]    Culture 60,000 COLONIES/mL ENTEROCOCCUS FAECALIS (A)  Final   Report Status 11/12/2023 FINAL  Final   Organism ID, Bacteria ENTEROCOCCUS FAECALIS (A)  Final      Susceptibility   Enterococcus faecalis - MIC*    AMPICILLIN  <=2 SENSITIVE Sensitive     NITROFURANTOIN <=16 SENSITIVE Sensitive     VANCOMYCIN  1 SENSITIVE Sensitive     * 60,000 COLONIES/mL ENTEROCOCCUS FAECALIS  Culture, blood (x 2)     Status: None   Collection Time: 11/25/23 10:31 PM   Specimen: BLOOD  Result Value Ref Range Status   Specimen Description BLOOD BLOOD LEFT ARM  Final   Special Requests   Final    BOTTLES DRAWN AEROBIC AND ANAEROBIC Blood Culture adequate volume   Culture   Final    NO GROWTH 5 DAYS Performed at [facility], [address] [place], [place] [address]    Report  Status 11/30/2023 FINAL  Final  Culture, blood (x 2)     Status: None   Collection Time: 11/25/23 10:31 PM   Specimen: BLOOD  Result Value Ref Range Status   Specimen Description BLOOD BLOOD RIGHT ARM  Final   Special Requests   Final    BOTTLES DRAWN AEROBIC AND ANAEROBIC Blood Culture adequate volume   Culture   Final    NO GROWTH 5 DAYS Performed at [facility], [address], [place], [place] [address]    Report Status 11/30/2023 FINAL  Final  Gastrointestinal Panel by PCR , Stool     Status: None   Collection Time: 11/30/23  5:10 PM   Specimen: Stool  Result Value Ref Range Status  Campylobacter species NOT DETECTED NOT DETECTED Final   Plesimonas shigelloides NOT DETECTED NOT DETECTED Final   Salmonella species NOT DETECTED NOT DETECTED Final   Yersinia enterocolitica NOT DETECTED NOT DETECTED Final   Vibrio species NOT DETECTED NOT DETECTED Final   Vibrio cholerae NOT DETECTED NOT DETECTED Final   Enteroaggregative E coli (EAEC) NOT DETECTED NOT DETECTED Final   Enteropathogenic E coli (EPEC) NOT DETECTED NOT DETECTED Final   Enterotoxigenic E coli (ETEC) NOT DETECTED NOT DETECTED Final   Shiga like toxin producing E coli (STEC) NOT DETECTED NOT DETECTED Final   Shigella/Enteroinvasive E coli (EIEC) NOT DETECTED NOT DETECTED Final   Cryptosporidium NOT DETECTED NOT DETECTED Final   Cyclospora cayetanensis NOT DETECTED NOT DETECTED Final   Entamoeba histolytica NOT DETECTED NOT DETECTED Final   Giardia lamblia NOT DETECTED NOT DETECTED Final   Adenovirus F40/41 NOT DETECTED NOT DETECTED Final   Astrovirus NOT DETECTED NOT DETECTED Final   Norovirus GI/GII NOT DETECTED NOT DETECTED Final   Rotavirus A NOT DETECTED NOT DETECTED Final   Sapovirus (I, II, IV, and V) NOT DETECTED NOT DETECTED Final    Comment: Performed at [facility], [address] [place], [place] [address]    Coagulation Studies: No results for input(s): LABPROT, INR in the  last 72 hours.   Urinalysis: No results for input(s): COLORURINE, LABSPEC, PHURINE, GLUCOSEU, HGBUR, BILIRUBINUR, KETONESUR, PROTEINUR, UROBILINOGEN, NITRITE, LEUKOCYTESUR in the last 72 hours.  Invalid input(s): APPERANCEUR     Imaging: No results found.        Medications:    albumin  human 25 g (12/09/23 1054)    sodium chloride    Intravenous Once   Chlorhexidine  Gluconate Cloth  6 each Topical Q0600   epoetin  alfa-epbx (RETACRIT ) injection  10,000 Units Intravenous Q T,Th,Sat-1800   feeding supplement (NEPRO CARB STEADY)  237 mL Per Tube 5 X Daily   feeding supplement (PROSource TF20)  60 mL Per Tube Daily   ferrous sulfate   325 mg Per Tube Daily   free water   120 mL Per Tube 5 X Daily   gentamicin  ointment   Topical TID   heparin  injection (subcutaneous)  5,000 Units Subcutaneous Q8H   insulin  aspart  10 Units Subcutaneous Once   midodrine   5 mg Per Tube TID WC   multivitamin  1 tablet Per Tube QHS   pantoprazole  (PROTONIX ) IV  40 mg Intravenous Q12H   saccharomyces boulardii  250 mg Per Tube BID   acetaminophen  **OR** acetaminophen , albumin  human, artificial tears, bisacodyl , ipratropium-albuterol , [DISCONTINUED] ondansetron  **OR** ondansetron  (ZOFRAN ) IV, mouth rinse, sennosides  Assessment/ Plan:  Mr. Robert Vega is a 83 y.o.  male with obstructive sleep apnea, GERD, obesity, hypertension, dysphagia, severe esophageal dysmotility with achalasia status post PEG tube placement, recent aspiration pneumonia acute respiratory failure status post extubation 10/09/2023, who was admitted to [facility] on 10/05/2023 for Aspiration into respiratory tract, initial encounter [T17.908A] Severe sepsis (HCC) [A41.9, R65.20] History of dysphagia [Z87.898] Fever, unspecified fever cause [R50.9] Multifocal pneumonia [J18.9]   1.  End stage renal disease requiting hemodialysis. Due to lack of meaningful recovery, we feel patient is now considered ESRD.   Patient is critically ill.  He was started on hemodialysis for severe hyperkalemia and uremia. Vascular surgery placed PermCath on 6/26.  - Next treatment scheduled for Saturday. - Accepted at [facility] on a TTS schedule, chair time 7:10am.  - TOC continues search for rehab.  - Awaiting family meeting schedule    2.  Sepsis and hypotension due to aspiration pneumonia, found to have distal esophageal obstruction with severe dysmotility.  He is status post botulinum toxin injection to the lower esophageal sphincter.  PEG tube placed this admission. Receiving bolus tube feeds with scheduled free water  flushes.  - Completed Zosyn  - Continue midodrine    4.Anemia in the setting of renal failure Lab Results  Component Value Date   HGB 8.5 (L) 12/16/2023  - Continue IV EPO  with HD treatments.  -Hemoglobin 8.5, blood transfusion received on 7/15  5.  Bilateral hydronephrosis Noted on CT.Urology team has evaluated the patient and it is thought to be secondary to reflux.  Due to health, further intervention deferred at this time.     LOS: 70 Ravan Schlemmer 7/21/202511:52 AM

## 2023-12-16 NOTE — ED Notes (Addendum)
 Pt cleaned and purwick placed on pt.   Assumed care of pt. Pt here from OSH where pt left AMA, here for further eval, pt is lethargic, on dialysis (unsure when last dialysis was performed), nonverbal at baseline, has a G tube for feeds, and their is a concern for sepsis. Pt on 2 L [place] sating 95%, not on o2 at baseline. Pts lung sounds are diminished with rales/crackles auscultated. Unable to assess pts orientation. Per pts visitor, pt has shown no symptoms of pain. Pt placed on cont cardiac monitoring and puls ox, NSR. Blood cultures were obtained by secondary RN.   WBC 11.5 Xray: c/f infection, pulmonary edmea  CT brain: no intracranial abnormalities, c/f sinusitis   Before pt was admitted in May, pt was verbal and completely independent per granddaughter.   No past medical history on file.

## 2023-12-16 NOTE — Progress Notes (Signed)
 Patients granddaughter is requesting patient to be signed out AMA. Writer verified [employer]. Charge nurse and MD aware of request. Patient IV and FMS removed. AMA form signed and placed in chart. Patient left via EMS transport with granddaughter.

## 2023-12-16 NOTE — Progress Notes (Signed)
  ID Pt has left AMA Did not see him this morning Wbc 10  been off antibiotics for more than 6 days

## 2023-12-16 NOTE — ED Notes (Addendum)
 Patient transported to CT

## 2023-12-16 NOTE — Progress Notes (Signed)
 [facility] Emergency Department Nursing Handoff    I - Illness Severity:   Patient is being admitted as Emergency to [place], A. The encounter diagnosis was Altered mental status, unspecified altered mental status type. First Call Provider is .  Code Status: No Order Isolation Status:Droplet, Contact Telemetry: Yes  Safety Concern: Falls Risk  Weight: (!) 105.4 kg (232 lb 5.8 oz) (12/16/23 1625) Weight Method: Bed Weight (12/16/23 1625)  Mobility: bedrest  Diet: No diet orders on file    S - Situation Awareness & Contingency Planning:  Private Encounter: No Screenings: Specialty Bed: None  Hip fracture protocol: No  Customer Service Issues:none  Transition orders: No   P- Patient Summary:  Chief Complaint  Patient presents with  . Dialysis - Needs   No past medical history on file. No past surgical history on file.  Assessment Assessment significant for: Neuro, Respiratory, Cardiac, and GI (see flowsheet for additional assessment details) Assessments (last filed documentation for the encounter) Adult (>=13yo)   Neuro: Glasgow Coma Scale Eye Opening: Spontaneous (12/16/23 1556) Best Verbal Response: Incomprehensible speech (12/16/23 1556) Best Motor Response: Localizes pain (12/16/23 1556) Glasgow Coma Scale Score: 11 (12/16/23 1556) Richmond Agitation Sedation Scale Richmond Agitation Sedation Scale %%: -1 Drowsy - Not fully alert, but has sustained awakening (eye-opening/eye contact) to voice (>10 seconds) (12/16/23 1822) Neurological Neuro (WDL): Exceptions to WDL (12/16/23 1556) Level of Consciousness: Lethargic (12/16/23 1556) Orientation Level: Unable to assess (12/16/23 1556) Cognition: Unable to assess due to other (Comment);Other (Comment) (pts speech incomprehensible) (12/16/23 1556) Comprehension: Follows tiered commands;Does not follow commands (12/16/23 1556) Communication: Other (Comment);Nonverbal (incomprehensible speech, nonverbal at baseline)  (12/16/23 1556) Speech: Non-verbal (nonverbal at baseline) (12/16/23 1556) Facial weakness: Unable to assess (12/16/23 1556) Facial Sensation (Cranial Nerve V): Unable to assess (12/16/23 1556) Motor Function/Sensation Assessment: Motor response/strength, Sensation;Grip, Foot dosiflexion/plantar flexion (12/16/23 1556)      R Hand Grip: Can overcome resistance (12/16/23 1556)      L Hand Grip: Can overcome resistance (12/16/23 1556)      R Foot Dorsiflexion: None (12/16/23 1556)      L Foot Dorsiflexion: None (12/16/23 1556)      R Foot Plantarflexion: None (12/16/23 1556)      L Foot Plantarfllexion: None (12/16/23 1556)      Motor Response RUE: Purposeful movement;Follows commands (12/16/23 1556)      Sensation RUE: Unable to assess (12/16/23 1556)      Motor Strength RUE: Can overcome resistance (12/16/23 1556)      Motor Response LUE: Purposeful movement;Follows commands (12/16/23 1556)      Sensation LUE: Unable to assess (12/16/23 1556)      Motor Strength LUE: Can overcome resistance (12/16/23 1556)      Motor Response RLE: Flaccid (12/16/23 1556)      Sensation RLE: Unable to assess (12/16/23 1556)      Motor Strength RLE: None (12/16/23 1556)      Motor Response LLE: Flaccid (12/16/23 1556)      Sensation LLE: Unable to assess (12/16/23 1556)      Motor Strength LLE: None (12/16/23 1556) Cerebellar Function: Unable to assess (12/16/23 1556) HEENT:   Respiratory: Respiratory Respiratory (WDL): Exceptions to WDL (12/16/23 1557) L Breath Sounds: Diminished;Crackles / rales (12/16/23 1557) R Breath Sounds: Diminished;Crackles / rales (12/16/23 1557) Respiratory Pattern: Tachypneic;Dyspnea;Shallow (12/16/23 1557)      Dyspnea grade: Dyspnea at rest (12/16/23 1557) Chest Assessment: Other (Comment) (symm on rise and fall) (12/16/23 1557) Cough/Sputum Description Cough: Productive (12/16/23  1557) Sputum Color: Green;Yellow (12/16/23  1557) Cardiac: Cardiovascular Cardiovascular  (WDL): Exceptions to WDL (12/16/23 1558) Cardiac Regularity: Regular (12/16/23 1626) Heart Sounds: S1, S2 (12/16/23 1558) Jugular Venous Distention (JVD): No (12/16/23 1408) Cardiac Rhythm: Normal sinus rhythm (12/16/23 1626) Cardiac Symptoms: Other (Comment) (UTA due to AMS and incomprehensible speech) (12/16/23 1558) Pulses: R radial;L radial (12/16/23 1558)      R Radial Pulse: Moderate (12/16/23 1558)      L Radial Pulse: Moderate (12/16/23 1558) Capillary Refill: Brisk - Less than or equal to 2 seconds - bilateral UEs (12/16/23 1558)  GI: Gastrointestinal (WDL): Exceptions to WDL (12/16/23 1559) Gastrointestinal GI Device Present?: Yes (G tube) (12/16/23 1559) Abdominal Palpation: Soft;No guarding;No rebound;Tenderness to light palpation (12/16/23 1559) GU:   Musculoskeletal: Neurovascular Assessment Pulses: R radial;L radial (12/16/23 1558)      R Radial Pulse: Moderate (12/16/23 1558)      L Radial Pulse: Moderate (12/16/23 1558) Motor Function/Sensation Assessment: Motor response/strength, Sensation;Grip, Foot dosiflexion/plantar flexion (12/16/23 1556)      R Hand Grip: Can overcome resistance (12/16/23 1556)      L Hand Grip: Can overcome resistance (12/16/23 1556)      R Foot Dorsiflexion: None (12/16/23 1556)      L Foot Dorsiflexion: None (12/16/23 1556)      R Foot Plantarflexion: None (12/16/23 1556)      L Foot Plantarfllexion: None (12/16/23 1556)      Motor Response RUE: Purposeful movement;Follows commands (12/16/23 1556)      Sensation RUE: Unable to assess (12/16/23 1556)      Motor Strength RUE: Can overcome resistance (12/16/23 1556)      Motor Response LUE: Purposeful movement;Follows commands (12/16/23 1556)      Sensation LUE: Unable to assess (12/16/23 1556)      Motor Strength LUE: Can overcome resistance (12/16/23 1556)      Motor Response RLE: Flaccid (12/16/23 1556)      Sensation RLE: Unable to assess  (12/16/23 1556)      Motor Strength RLE: None (12/16/23 1556)      Motor Response LLE: Flaccid (12/16/23 1556)      Sensation LLE: Unable to assess (12/16/23 1556)      Motor Strength LLE: None (12/16/23 1556) Stroke:     Skin: Obvious skin breakdown noted in ED: Yes  Pt voided in the ED: Incontinent   Patient is unable to assess orientation.  Last Vital Signs: T 36.6 C (97.8 F) (12/16/23 1822)  BP (!) 141/78 (12/16/23 1822)  HR 71 (12/16/23 1822)  RR (!) 34 (12/16/23 1822)  O2sat 97 % Nasal cannula (12/16/23 1822) Nasal cannula 2 L/min  Pain     Labs:  Labs Reviewed  SHOCK PANEL, VENOUS - Abnormal      Result Value   Patient Temperature, Venous 37.0     FIO2, Venous       pH, Venous 7.28 (*)    PCO2, Venous 57 (*)    PO2, Venous 61 (*)    Base Excess, Venous -1     Bicarbonate, Venous 27     CO2 TOTAL, VENOUS 29     Hemoglobin, Venous 9.4 (*)    Hematocrit, Venous 28.0 (*)    % O2 Hemoglobin, Venous 89.8 (*)    % CO Hemoglobin, Venous 2.3 (*)    % Methemoglobin, Venous 0.6     Volume % O2, Venous 11.9     Sodium, Venous 129 (*)    Potassium, Venous 4.5  Glucose, Nonfasting, Venous 109     Calcium , Ionized 1.14 (*)    Lactate, Venous 1.0    COMPREHENSIVE METABOLIC PANEL (CMP) - Abnormal   Sodium 131 (*)    Potassium 4.4     Chloride 93 (*)    Carbon Dioxide (CO2) 24     Urea Nitrogen (BUN) 101 (*)    Creatinine 6.5 (*)    Glucose 115     Calcium  8.4 (*)    AST (Aspartate Aminotransferase) 36     ALT (Alanine Aminotransferase) 25     Bilirubin, Total 0.4     Alk Phos (Alkaline Phosphatase) 102     Albumin  1.7 (*)    Protein, Total 8.2 (*)    Anion Gap 14 (*)    BUN/CREA Ratio 16     Glomerular Filtration Rate (eGFR)  8    COMPLETE BLOOD COUNT (CBC) WITH DIFFERENTIAL - Abnormal   WBC (White Blood Cell Count) 11.5 (*)    Hemoglobin 9.1 (*)    Hematocrit 29.4 (*)    Platelets 407     MCV (Mean Corpuscular Volume) 92     MCH (Mean Corpuscular  Hemoglobin) 28.5     MCHC (Mean Corpuscular Hemoglobin Concentration) 31.0 (*)    RBC (Red Blood Cell Count) 3.19 (*)    RDW-CV (Red Cell Distribution Width) 16.1 (*)    NRBC (Nucleated Red Blood Cell Count) 0.00     NRBC % (Nucleated Red Blood Cell %) 0.0     MPV (Mean Platelet Volume) 10.1     Neutrophil Count 9.0 (*)    Neutrophil % 78.6     Lymphocyte Count 0.6     Lymphocyte % 5.6 (*)    Monocyte Count 1.3 (*)    Monocyte % 11.7     Eosinophil Count 0.27     Eosinophil % 2.3     Basophil Count 0.06     Basophil % 0.5     Immature Granulocyte Count 0.15 (*)    Immature Granulocyte % 1.3 (*)   PROTHROMBIN TIME (INR) - Normal   Prothrombin Time 12.0     Prothrombin INR 1.0    ACTIVATED PARTIAL THROMBOPLASTIN TIME (APTT) - Normal   Act Partial Thromboplastin Time 27.1    POC GLUCOSE - Normal   POC Glucose 122     Narrative:    POC TEST(S) ABOVE PERFORMED AT THE PATIENT CARE LOCATION AND OVERSEEN BY THE [facility] POCT PROGRAM.  POC CORONAVIRUS (COVID-19) SARS-COV-2 RAPID TEST - Normal   POC Coronavirus (COVID-19) SARS-CoV-2 Rapid Test Not Detected     Narrative:    POC TEST(S) ABOVE PERFORMED AT THE PATIENT CARE LOCATION AND OVERSEEN BY THE [facility] POCT PROGRAM. Your COVID-19 coronavirus test was negative (i.e., the COVID-19 coronavirus was NOT detected in your sample). This means you are unlikely to be infected with the COVID-19 coronavirus unless you have recent exposures to a known COVID-19-positive person or new symptoms since the sample was collected. If your symptoms worsen, please call your provider's office during business hours, or the main [facility] COVID hotline at [phone] (weekdays 8am-5pm) and Employee Health COVID hotline (option 1) (weekdays 8am-5pm and weekends 8am-12pm). If you are experiencing a life-threatening emergency, please call 911.  POC INFLUENZA A AND/OR B RAPID ASSAY - Normal   POC Influenza A RNA Not Detected     POC Influenza B RNA Not Detected     Narrative:     POC TEST(S) ABOVE PERFORMED AT THE PATIENT  CARE LOCATION AND OVERSEEN BY THE [facility] POCT PROGRAM.  CULTURE, BLOOD  CULTURE, BLOOD  CULTURE, URINE, ROUTINE WITH PYURIA SCREEN (REFLEXED FROM URINALYSIS)  AMMONIA  PROTHROMBIN TIME (INR)  CULTURE, URINE, ROUTINE WITH PYURIA SCREEN (REFLEXED FROM URINALYSIS)  POC GLUCOSE    Test completed/ordered:  ED Imaging Orders (From admission, onward)    Start     Status Ordering Provider   12/16/23 1650  CT abdomen pelvis with contrast  1 time imaging       Comments: IV Contrast Only   Acknowledged PATTERSON MADURO [facility]   12/16/23 1227  ECG 12-lead  Once        Final result WHITE, LINDSEY MCKISSICK   12/16/23 1227  X-ray chest PA and lateral  1 time imaging       Comments: Sepsis, evaluate for pulmonary source of infection   Final result WHITE, LINDSEY MCKISSICK   12/16/23 1227  CT brain without contrast  1 time imaging        Final result WHITE, LINDSEY MCKISSICK       LDA: Peripheral IV 12/16/23 Right Basilic;Forearm (0) Peripheral IV 12/16/23 Left Basilic;Forearm (0)  Pain: Pt indicates pain by moaning and grimacing, no pain meds have been given. Pt nonverbal at baseline.   Meds ordered in ED:  ED Medication Administration from 12/16/2023 1215 to 12/16/2023 1829     None       Medications/IV Fluids currently infusing: none    A - Action List:   Important Nursing handoff items:  - lethargic, c/f sepsis, no fevers, G tube, nonverbal at baseline  -xray: pulm edema, infection  -CT: WDL brain, c/f sinusitis  -WBC 11.5  -nephrology consult (hemodialysis)   -blood cult obtained  -afebrile  -nonverbal at baseline, in May pt was independent and verbal and then after hospitalization/requiring intubation, pt has been steadily declining per granddaughter   S- Synthesis by Receiver:  For questions or clarification from the ED Nurse please call [phone] and ask for HALEY SWAZIL[place]  ED Arrival: 12/16/2023 1215

## 2023-12-17 DIAGNOSIS — D649 Anemia, unspecified: Secondary | ICD-10-CM | POA: Diagnosis not present

## 2023-12-17 DIAGNOSIS — N2889 Other specified disorders of kidney and ureter: Secondary | ICD-10-CM | POA: Diagnosis not present

## 2023-12-17 DIAGNOSIS — N13721 Vesicoureteral-reflux with reflux nephropathy without hydroureter, unilateral: Secondary | ICD-10-CM | POA: Diagnosis not present

## 2023-12-17 DIAGNOSIS — F05 Delirium due to known physiological condition: Secondary | ICD-10-CM | POA: Diagnosis not present

## 2023-12-17 DIAGNOSIS — N186 End stage renal disease: Secondary | ICD-10-CM | POA: Diagnosis not present

## 2023-12-17 DIAGNOSIS — R4182 Altered mental status, unspecified: Secondary | ICD-10-CM | POA: Diagnosis not present

## 2023-12-17 DIAGNOSIS — N1330 Unspecified hydronephrosis: Secondary | ICD-10-CM | POA: Diagnosis not present

## 2023-12-17 DIAGNOSIS — R1310 Dysphagia, unspecified: Secondary | ICD-10-CM | POA: Diagnosis not present

## 2023-12-17 DIAGNOSIS — N179 Acute kidney failure, unspecified: Secondary | ICD-10-CM | POA: Diagnosis not present

## 2023-12-17 DIAGNOSIS — N3289 Other specified disorders of bladder: Secondary | ICD-10-CM | POA: Diagnosis not present

## 2023-12-17 DIAGNOSIS — H61303 Acquired stenosis of external ear canal, unspecified, bilateral: Secondary | ICD-10-CM | POA: Diagnosis not present

## 2023-12-17 DIAGNOSIS — I10 Essential (primary) hypertension: Secondary | ICD-10-CM | POA: Diagnosis not present

## 2023-12-17 DIAGNOSIS — G928 Other toxic encephalopathy: Secondary | ICD-10-CM | POA: Diagnosis not present

## 2023-12-17 DIAGNOSIS — N400 Enlarged prostate without lower urinary tract symptoms: Secondary | ICD-10-CM | POA: Diagnosis not present

## 2023-12-17 DIAGNOSIS — E46 Unspecified protein-calorie malnutrition: Secondary | ICD-10-CM | POA: Diagnosis not present

## 2023-12-17 DIAGNOSIS — R310 Gross hematuria: Secondary | ICD-10-CM | POA: Diagnosis not present

## 2023-12-17 NOTE — Consults (Signed)
 RRT Consult start time: 0045 Consult Trigger: Consult ordered Reason for Consult: Lab specimen collection O2 Saturation at start of the RRT Consult: >95% O2 Saturation at end of the RRT Consult: >95% Interventions: BMP, CBC, Venous shock panel, and Type & Screen/ ABO Conf PRT consulted for STAT lab collection. Specimens drawn and sent to appropriate receiving laboratories. Primary RN expresses no further needs or concerns at this time. PRT remains available to assist with any that may arise. Patient outcome: Stayed in room Patient unit location at the end of the RRT Consult: [id number] Consult End time: 0057

## 2023-12-17 NOTE — Consults (Signed)
 UROLOGY CONSULT NOTE  Service Requesting Consultation: Medicine   Service Providing Consultation: Urology Author: MING-YEAH HU, MD 12/17/2023 4:49 AM SUBJECTIVE:  REASON FOR CONSULTATION:  bilateral forniceal rupture  HPI: Robert Vega is a 83 y.o. old male presenting to ED after family signed him out AMA from prolonged stay at OSH. Complicated OSH course s/f initial presentation due to respiratory arrest and intubation 2/2 aspiration, self-removal of G tube c/b incorrect replacement resulting in TF going into peritoneum requiring ex-lap for sepsis, acute renal failure thought to be 2/2 sepsis requiring new start HD since 11/11/23, and recently persistent unexplained leukocytosis (and AMS?) requiring prolonged abx. Ucx 60k enterococcus last zosyn  until 7/15. There was discussion of hospice but family declined. WBC overall downtrending from 20 two weeks ago but persistently above 10 before discharge.  He was altered in ED. CT obtained due to abdominal pain and showed bilateral forniceal rupture for which urology was consulted. Unfortunately, primary unable to contact family for collateral. Please see their admission H&P on 12/16/23 for further details.  My chart review shows notes of mild-mod bilateral hydro down to UVJ since CT on 5/26. Then developed left perinephric hematoma on 12/04/23. Urology was involved during his OSH admission. Noted on 11/12/23 re consult for bilateral hydronephrosis, PCNs deferred as thought to be due to reflux. On 7/15, consulted for new left perinephric hematoma on CT. It seemed they were continuing to monitor white count and repeat imaging before making decisions on any stent/PCN.  Notable events from OSH stay: 5/27: ex lap, washout, open g-tube placement, review of op note shows no concern for ureteral injury  Imaging 10/21/23: CT noncon - hydro first noted Excreted contrast within the kidneys, ureters, and bladder.  Please correlate with any recent contrasted  procedure, as there is  no recent contrasted imaging study. Mild bilateral hydronephrosis  and hydroureter without clear etiology.  10/22/23: CT noncon - similar as above  11/02/23: RBUS - Mild bilateral hydronephrosis.  No shadowing stone. Bladder  Appears normal for degree of distention.   11/02/23: CT noncon - similar  11/10/23: CT noncon - hydro, now fluid in the pararenal  space as well as in the paracolic gutters.   11/26/23: CT noncon - moderate bilateral hydronephrosis and hydroureter to the ureterovesicular  junctions without calculus or other visible obstruction. Interval increase in extensive bilateral simple attenuation perinephric fluid (series 2, image 80). Severe wall thickening of the decompressed  urinary bladder.   12/04/23: CT noncon - new moderate L perinephric hematoma (5.5 x 1.4cm axial), similar or slightly increased bilateral hydronephrosis and hydroureter. There is high attenuating content within the renal collecting systems bilaterally indicative of proteinaceous or hemorrhagic content. No stone. Interval increase in the right perinephric stranding. The urinary bladder is collapsed.   No prior urology hx in chart.  Here, patient was altered since ED. On exam, patient's most recent vitals are: T 36.4 C (97.5 F), HR 73, BP 123/76, RR 26. Robert Vega is currently satting 99 % on 2L [place]. Labs are notable for a WBC 11.6*, Hgb 8.3*, Cr 6.9*.  PMH: No past medical history on file.:  PSH: No past surgical history on file.:  FAMILY HISTORY: Family History  Problem Relation Name Age of Onset  . Cerebral aneurysm Mother    :   SOCIAL HISTORY: Social History   Tobacco Use  . Smoking status: Former    Types: Cigars, Pipe    Quit date: 2015    Years since quitting: 10.5  .  Smokeless tobacco: Never  . Tobacco comments:    Patient confirms previously smoking cigars/ pipes. Quit sometime in 2015. 09/11/2023 -IC  Substance Use Topics  . Alcohol  use: Never  .  Drug use: Never  .    ALLERGIES: Allergies  Allergen Reactions  . Sulfa (Sulfonamide Antibiotics) Other (See Comments)  :  MEDICATIONS: Scheduled Meds  aspirin , 81 mg, Oral, Daily heparin  (porcine), , Intracatheter, As Directed Admin [Held by provider] heparin  (porcine), 5,000 Units, Subcutaneous, Q8H SCH meropenem (MERREM) IV Traditional Therapy, 500 mg, Intravenous, Q24H micafungin (MYCAMINE) IV, 150 mg, Intravenous, Q24H omeprazole  oral suspension 2 mg/mL, 40 mg, Via Tube, Daily vancomycin  (VANCOCIN ) adult IV (peripheral), 1.25 g, Intravenous, Dialysis   Infusions    PRN Meds  acetaminophen , 650 mg, Oral, Q6H PRN dextrose , 50 mL/hr, Intravenous, As Directed dextrose  50% in water , 12.5-25 g, Intravenous, As Directed glucagon, 1 mg, Intramuscular, As Directed lidocaine , 0.5 mL, Subcutaneous, As Directed ondansetron , 4 mg, Oral, Q8H PRN  Or ondansetron , 4 mg, Intravenous, Q8H PRN    OBJECTIVE:   PHYSICAL EXAM:   Current Vital Signs 24h Vital Sign Ranges  T 36.8 C (98.2 F) (12/17/23 0014) Temp  Avg: 36.4 C (97.5 F)  Min: 35.9 C (96.6 F)  Max: 36.8 C (98.2 F)  BP (!) 151/76 (12/17/23 0014) BP  Min: 119/70  Max: 168/87  HR 75 (12/17/23 0014) Pulse  Avg: 72.6  Min: 66  Max: 82  RR 19 (12/17/23 0014) Resp  Avg: 25.5  Min: 18  Max: 34  O2sat 96 % Nasal cannula (12/17/23 0014) SpO2  Avg: 96.8 %  Min: 95 %  Max: 99 %       Current Shift Ins/Outs 24h Total Ins/Outs  Time Ins Outs No intake/output data recorded. 07/21 0701 - 07/22 0700 In: 250  Out: -        Weights   First (!) 105.4 kg (232 lb 5.8 oz) (12/16/23 1625)   Last (!) 105.4 kg (232 lb 5.8 oz) (12/16/23 1625)    GENERAL ASSESSMENT: acutely ill, frail MENTAL STATUS: only opens eyes to pain, not responsive to questions RESPIRATORY: mildlt shallow breaths, 2L [place] ABDOMEN: Abdomen is soft, exhibits some guarding on palpation globally   LABS: Chemistry: Recent Labs  Lab 12/16/23 1333 12/17/23 0059   NA 131*  129* 129*  129*  K 4.4 4.5  CL 93* 91*  CO2 24 25  BUN 101* 109*  CREATININE 6.5* 6.9*  GLUCOSE 115 104  CALCIUM  8.4* 8.2*  MG  --  2.8*  PHOS  --  6.1*    CBC, diff: Recent Labs  Lab 12/16/23 1333 12/17/23 0059  WBC 11.5* 11.6*  HGB 9.1* 8.3*  HCT 29.4* 26.8*  MCV 92 92  PLT 407 402  NEUTCT 9.0* 9.1*  LYMPHCT 0.6 0.7  MONOCT 1.3* 1.4*  EOSCT 0.27 0.25  BASOCT 0.06 0.06    MICRO:  Urinalysis: Lab Results  Component Value Date   COLORU Yellow 12/17/2023   CLARITYU Cloudy (!) 12/17/2023   SPECGRAV 1.027 12/17/2023   LABPH 8.5 (H) 12/17/2023   PROTEINUA 3+ (!) 12/17/2023   GLUCOSEU 1+ (!) 12/17/2023   KETONESU Negative 12/17/2023   BLOODU 1+ (!) 12/17/2023   NITRITE Negative 12/17/2023   LEUKOCYTESUR Negative 12/17/2023   BILIRUBINUR Negative 12/17/2023   UROBILINOGEN 0.2 12/17/2023    IMAGING:  7/22 CT with contrast (with delays)  Findings consistent with moderate bilateral hydroureteronephrosis complicated by bilateral forniceal ruptures with left greater than right retroperitoneal  urinomas.  Findings were discussed by Epic secure chat with Morgan MD by Lang Payment, MD at 12/17/2023 12:07 AM.  Romm, Lang Ade, MD on 12/16/2023 11:52 PM EDT [facility] Virtual, Radiology)  Bilateral peripherally enhancing retroperitoneal fluid collections with surrounding fat stranding. Collection on the left measures approximately 7.0 x 13.0 x 11.3 cm (series 5 image 88, series 7 image 53). And on the right measuring 8.7 x 3.0 x 6.0 cm (series 5 image 70). Both of these fluid collections appear to arise from the and renal pelvises and may represent bilateral forniceal ruptures versus retroperitoneal abscesses. The patient will be brought back to radiology to obtain delayed imaging to evaluate for potential forniceal rupture.  Moderate hydroureteronephrosis. Bladder is decompressed and thick-walled.  Gastrostomy tube in place with balloon in the gastric antrum  approximately 5.8 cm from the ventral body wall, consider tensioning.  Small bilateral pleural effusions and adjacent atelectasis.   ASSESSMENT:  GU Problem List:  #Bilateral forniceal rupture #Bilateral hydronephrosis  83 y.o. old male signed out AMA from complicated two month stay at OSH notable for initial presentation due to respiratory arrest and intubation 2/2 aspiration, self-removal of G tube c/b incorrect replacement resulting in TF going into peritoneum requiring ex-lap for sepsis, acute renal failure thought to be 2/2 sepsis requiring new start HD since 11/11/23, and leukocytosis of unknown origin (Ucx 60k enterococcus). Urology consulted for abdominal pain and bilateral forniceal rupture.  It is difficulty to parse through his OSH records, but he seems to have had bilateral hydro down to UVJ of unknown cause that was followed by urology dating back to 10/21/23. He seems to then have developed perinephric fluid or hematoma in early July. He now has evidence of bilateral forniceal rupture. This is unusual as he has no stone or obvious obstructing cause. His bladder has been decompressed per imaging reports.  He did have ex lap in 09/2023 but low c/f iatrogenic cause based on his overall story.  It is unclear how much of this is contributing to his acute renal failure (this occurred before known forniceal rupture) and persistent leukocytosis. However, he did have a + ucx and there is some peripheral enhancement of the left urinoma c/f developing infection. I would recommend decompression with bilateral PCNs and considering drainage of the urinomas, but this should all be taken into the context of his overall clinical status.  Would benefit from discussion with family on GOC and what his baseline has been to see if he has had an acute change in mental status due to this forniceal rupture.  PLAN:  - NPO - Hold anticoagulation - Primary to help obtain collateral and discuss GOC with  family - Discuss placement of bilateral PCNs and possible drainage of urinoma - Follow up cultures - Continue broad abx  Thank you for this consult, urology will continue to follow  Please page the Urology Consult Pager @ [phone] with further questions or new concerns.   Thank you for allowing us  to participate in this patient's care. The attending of record for this consultation is Dr. June.  Ming-Yeah Hu, MD Urology  PGY3 Urology Consult Pager [phone]     Attestation Statement:   The patient is in need of drainage of the upper tract/kidney.  We spoke of a ureteral stent and a percutaneous nephrostomy tube as options, and compared and contrasted the pros and cons of each. The benefit of a ureteral stent includes drainage of the kidney without the need for an external  collection bag, and the disadvantages include pain/discomfort, stent migration, calcification requiring additional procedures, inability to adequately drain the kidney/becoming obstructed without obvious evidence of such leading to further loss of kidney function, need for a general anesthetic to exchange the stent, among others.  The benefits of a percutaneous nephrostomy tube include the ability to visually inspect that it is draining urine (eg not obstructed), ease of exchange that does not require general anesthesia, and possible disadvantages include its external nature requiring a drainage bag, dislodgement, pain, infection, occasional need for irrigation, among others. Loss of kidney function, often silent and unbeknownst to the patient, is more common with a ureteral stent vs a PCN tube [the patient can see and measure the amount of urine in the collection bag].  Silent loss of renal function is more commonly seen with malignant obstruction or if the patient does not exchange the ureteral stent at the recommended 3 month intervals [if kept too long, the stent will encrust, thereby inhibiting drainage of the kidney].   For patients with malignant ureteral obstruction, a PCN is strongly advised and is the preferred management option.  At this point, the patient has elected a percutaneous nephrostomy tube.  We described how this procedure is performed by an interventional radiologist and have coordinated care and made the referral. Antegrade pyelography under fluoroscopy is also recommended - the risk of radiation exposure was discussed as well.   The risks, benefits, possible complications and alternatives were discussed with the patient.  These include but are not limited to bleeding, infection, positioning injury, reaction to anesthesia, heart attack, stroke, or other adverse medical outcome; injury to the ureteral orifice(s) or ureter(s) which includes stricture, avulsion, perforation; pain - temporary or permanent, stent migration; inability to make a diagnosis, or the need for further treatment; need to access the upper tract (eg nephrostomy tube) vs lower tract (eg stent), or other unforeseen complication.  If a ureteral stent or nephrostomy tube is used, the patient will need to make an appointment to have it removed or periodically exchanged as it is usually placed only as a temporary measure. No guarantees as to the result have been made.  After a thorough discussion of the risks, benefits, complications and alternatives, we felt that the patient had sufficient information to make a decision.  Informed consent was obtained. To diminish the chance of bleeding, we spoke about managing any anticoagulants/blood thinning agents prior to the procedure. The nursing staff can also provide specific instructions.  The patient also understands that it is important to followup after the procedure to discuss any procedural findings and plan for any subsequent treatment.   The patient has a number of significant co-morbidities. The higher risk anesthetic/surgical conditions include but are not limited to use of anticoagulation,  immunosuppression, general medical comorbidities such as HTN, DM, renal and/or liver disease, etc, and frailty.  I told the patient that they are considered a high risk anesthesia and/or surgical candidate and the implications were discussed.  Patients who have compromised cardiac performance, cardiovascular disease, significant pulmonary compromise, renal insufficiency or require anticoagulants, among other medical conditions, are at increased surgical and anesthetic risk.  We reviewed the risks and potential complications of the procedure and the patient expressed understanding that they would be at increased risk compared to the average patient undergoing the procedure.  Therefore, in order to better evaluate if they would qualify as a surgical candidate, I felt that the patient should be evaluated by an Internist or other medical specialist  who can assist in establishing the level of risk and help optimize the patient's medical conditions prior to surgery and anesthesia.  The patient asked questions and expressed understanding.    Would also maintain abx coverage.  Remainder of recs as outlined  I personally saw and evaluated the patient, and participated in the management and treatment plan as documented in the resident/fellow note.  Debby Lynwood Mends, MD

## 2023-12-17 NOTE — Progress Notes (Signed)
                 Pharmacokinetic Monitoring Note               Robert Vega is a 83 y.o. male who is starting vancomycin  therapy for UTI, with a goal pre-HD level 15-20 mcg/mL.   Pertinent Objective Data: Ht: 174 cm   Wt: (!) 105.4 kg (232 lb 5.8 oz) Ideal body weight: 69.6 kg (153 lb 5.3 oz) Adjusted ideal body weight: 83.9 kg (184 lb 15.1 oz)   Recent Labs  Lab 12/16/23 1333 12/17/23 0059  WBC 11.5* 11.6*  BUN 101*  --   CREATININE 6.5*  --   Currently receiving iHD    Assessment/Plan: Based on this information the following dose will be started: Vancomycin  2250 mg x 1, then 1250 mg with HD   Thank you for the consult. A clinical pharmacist will continue to monitor the patient's renal function, cultures/sensitivities, clinical response to therapy, and need for levels during this admission per hospital protocol. For additional questions, please page the covering pharmacist.  NKOLIKA UZOUKWU, PharmD

## 2023-12-17 NOTE — Progress Notes (Signed)
  [facility]  Physical Therapy  ATTEMPTED VISIT NOTE  Patient Name:  Robert Vega Room/Bed: 4321/4321-01    Referral received and attempt made to initiate evaluation.   The patient is unavailable for therapy and is in dialysis. Patient will; also have a procedure after dialysis. Will follow up tomorrow.  Oran Redhead, PT, DPT

## 2023-12-17 NOTE — Progress Notes (Signed)
 Patient arrived to unit after 0000. TF on hold and patient NPO effective upon arriving to unit. D10 IV infusion running since TF are on hold.   Foley cath in place. Minimal output noted (5ml). Entry site of foley later noted to have small amount of blood leaking. Provider notified.   Blood glucose in the 100's overnight. Bedside tele monitor active.   PEG tube site intact. Yellow drainage noted when flushing during the shift.  UA sample sent to central lab overnight.  Patient scheduled for dialysis after 0700.

## 2023-12-17 NOTE — Progress Notes (Addendum)
 Seen at 0030 and note written later after CT done showed bil forniceal ruptured and some RP fluid collection concerning for urinoma  and hydro .  I reviewed CT in careverywhere from July that showed some perinephric hematoma and some hydro and in June that showed hydro,rest of CT result per report  Patient can open eyes briefly,moves arms and move both feet some( when tickled) did not answer any commands and had diffuse TTP and he had RR around 26-30,BP and HR ok I ordered VBG shock/CBC/many other labs per EPIC ALL reviewed ,I placed foley and I started mica/mero/vanco. UA done but not enough urine for cx,BLOOD cx already done during days I consulted urology who did not think emergent procedure overnight but he  will likely need bilateral PCN if within goals of care of family. I also discussed with urology whether he needs JP drains of these fluid collection or aspiration and sampling and sending for cell count and culture,I discussed CT with radiology/urology and these fluid looks like urinoma and he had hyperenhancement on left ? From infection Defer final plan to day MD  I held [place] heparin  I stopped TF I started d10 but will need to follow Na and he is on HD  I ordered strict intake output The above Mental status is same as documented in H&P HE HAD MRI BRAIN at OSH and he had CT brain in ER this admission all reviewed Stepdown VS q2 Accucheck Q2  Changed HOB to 45 degrees from 30 since he has large [facility]  Mero chosen based on prior ABX given per HPI

## 2023-12-17 NOTE — Progress Notes (Signed)
 Inpatient Transport:  Patient must be transported with monitor and/or licensed personnel was ordered.  NOEH SPARACINO was transported to [facility] 4321 from Dialysis by the Clinical Transport Team nurse, ADRIANNA PEACH, RN. Pt was transported on continuous cardiac monitoring.   The trip was tolerated well by the patient.   Patient was re-connected to bedside monitor upon arrival to procedural area. Please see documentation flow sheet in chart for specific trip information.  Hand-off completed with care nurse upon arrival. Care transferred.   Additional Trip Note(s): None

## 2023-12-17 NOTE — Progress Notes (Signed)
 Nutrition Initial Assessment  Reason for visit: MD consult (TF evaluation)  Admission Diagnosis / Current Problem: H&P: Robert Vega is a 83 y.o. male HTN, GERD, dysphagia, new ESRD on dialysis who was brought to [facility] ED after being signed out AMA from OSH  Notable events from OSH stay: 5/27: ex lap, washout, open g-tube placement  Interval History: 7/22: TF paused, D10 started  Nutrition Assessment: Anthropometrics  Admit Height:   174 cm Admit Weight: (!) 105.4 kg (232 lb 5.8 oz) (12/16/23 1625) Admit BMI: 34.8 kg/m  Ideal body weight: 166 lb 14.2 oz (75.7 kg) (BMI 25) Usual body weight: patient granddaughter unsure  Weight History: Wt Readings from Last 20 Encounters:  12/16/23 (!) 105.4 kg (232 lb 5.8 oz)  09/11/23 (!) 108.6 kg (239 lb 6.4 oz)  07/29/23 (!) 107.1 kg (236 lb 3.2 oz)   Weights this admission: 7/22 99.5 kg  % Weight change: 9.1 kg (8.3%) weight loss in ~3 months   Nutrition History:   Food Allergies: NKFA Intake: Receiving G-tube feeds at OSH. Nepro at 50 mL/hr with increasing BUN and ammonia with additional protein modulars. Prior to a renewed aspiration risk at the end of his hospitalization he was on purees and thin liquids            Current Nutrition: Diet Order: tube feeding (NOVASOURCE RENAL) liquid DIET NPO Do NOT hold tube feeding Effective Now DIET NPO Except for: MEDS; Do NOT hold tube feeding Effective Midnight Intake: G-Tube feeds of Novasource renal started, paused for PCN placement in IR                 Nutrition-Focused Findings: Physical Assessment: loss of muscle mass;loss of subcutaneous fat (12/17/23 1400)  Loss of muscle mass: temple - mild  Loss of subcutaneous fat: buccal region - mild   Last BM: 12/17/23       Biochemical Data and Medication (nutritionally relevant): Labs: Iron 21 (L), Folic Acid  2.9 (L), Vit B12 7,333 (H) Medications: reviewed . [Held by provider] tube feeding Stopped (12/17/23 0015)    Estimated Nutritional Needs: Calculation Weight Used: Admission weight Energy: 2000-2100 kcal (MSJ x 1.2) Protein: 90-110 g (1-1.2 g/kg at BMI of 30) Fluid: 2L (per primary team)  Nutrition Evaluation: Patient seen for MD consult for TF initiation. Presents obese per BMI with non-nutritionally significant weight loss in the past 3 months. Patient with existing G-tube 2/2 dysphagia. TF held for bilateral PCN placement in IR, now pushing to another day. Spoke with patient's granddaughter at length to learn of OSH TF regimen and hx with PO diet.   Nutrition Diagnosis: -NI-5.3 Inadequate protein-energy intake related to clinical course as evidenced by NPO, TF held for procedure  Malnutrition Assessment: Diagnosis: mild protein-calorie malnutrition (12/17/23 1500) in the context of chronic illness Criteria: >7.5% unintentional weight loss in 3 months Intervention: Recommend enteral nutrition support (12/17/23 1500)  Nutrition Interventions / Recommendations:  As clinically appropriate, begin NovaSource Renal at 15 mL/hr. Increase by 15 mL/hr q8h to a goal of 45 mL/hr Provides 1080 mL, 2160 kcal, 98 g protein, 774 mL free water  Min 30 mL FWF q4h, 200 mL q4h for hydration per primary team Once preparing for d/c can consider 180 mL q3h daytime bolus feeds from 0600-2100 (6 feeds/day)   SLP consult for diet as able Obtain weekly weight to trend  Assessed at: 1509  Time Spent: 55 Minute(s)   LAURA ELIZABETH VAN ALTHUIS, RD, LDN

## 2023-12-18 DIAGNOSIS — R1310 Dysphagia, unspecified: Secondary | ICD-10-CM | POA: Diagnosis not present

## 2023-12-18 DIAGNOSIS — N2889 Other specified disorders of kidney and ureter: Secondary | ICD-10-CM | POA: Diagnosis not present

## 2023-12-18 DIAGNOSIS — J90 Pleural effusion, not elsewhere classified: Secondary | ICD-10-CM | POA: Diagnosis not present

## 2023-12-18 DIAGNOSIS — G928 Other toxic encephalopathy: Secondary | ICD-10-CM | POA: Diagnosis not present

## 2023-12-18 DIAGNOSIS — R918 Other nonspecific abnormal finding of lung field: Secondary | ICD-10-CM | POA: Diagnosis not present

## 2023-12-18 DIAGNOSIS — R4182 Altered mental status, unspecified: Secondary | ICD-10-CM | POA: Diagnosis not present

## 2023-12-18 DIAGNOSIS — N179 Acute kidney failure, unspecified: Secondary | ICD-10-CM | POA: Diagnosis not present

## 2023-12-18 DIAGNOSIS — F05 Delirium due to known physiological condition: Secondary | ICD-10-CM | POA: Diagnosis not present

## 2023-12-18 DIAGNOSIS — N13721 Vesicoureteral-reflux with reflux nephropathy without hydroureter, unilateral: Secondary | ICD-10-CM | POA: Diagnosis not present

## 2023-12-18 DIAGNOSIS — I10 Essential (primary) hypertension: Secondary | ICD-10-CM | POA: Diagnosis not present

## 2023-12-18 DIAGNOSIS — E46 Unspecified protein-calorie malnutrition: Secondary | ICD-10-CM | POA: Diagnosis not present

## 2023-12-18 DIAGNOSIS — Z4659 Encounter for fitting and adjustment of other gastrointestinal appliance and device: Secondary | ICD-10-CM | POA: Diagnosis not present

## 2023-12-18 DIAGNOSIS — N3289 Other specified disorders of bladder: Secondary | ICD-10-CM | POA: Diagnosis not present

## 2023-12-18 DIAGNOSIS — N400 Enlarged prostate without lower urinary tract symptoms: Secondary | ICD-10-CM | POA: Diagnosis not present

## 2023-12-18 DIAGNOSIS — H61303 Acquired stenosis of external ear canal, unspecified, bilateral: Secondary | ICD-10-CM | POA: Diagnosis not present

## 2023-12-18 DIAGNOSIS — R931 Abnormal findings on diagnostic imaging of heart and coronary circulation: Secondary | ICD-10-CM | POA: Diagnosis not present

## 2023-12-18 DIAGNOSIS — N1330 Unspecified hydronephrosis: Secondary | ICD-10-CM | POA: Diagnosis not present

## 2023-12-18 DIAGNOSIS — R310 Gross hematuria: Secondary | ICD-10-CM | POA: Diagnosis not present

## 2023-12-18 DIAGNOSIS — D649 Anemia, unspecified: Secondary | ICD-10-CM | POA: Diagnosis not present

## 2023-12-18 DIAGNOSIS — N186 End stage renal disease: Secondary | ICD-10-CM | POA: Diagnosis not present

## 2023-12-19 DIAGNOSIS — D649 Anemia, unspecified: Secondary | ICD-10-CM | POA: Diagnosis not present

## 2023-12-19 DIAGNOSIS — N186 End stage renal disease: Secondary | ICD-10-CM | POA: Diagnosis not present

## 2023-12-19 DIAGNOSIS — R931 Abnormal findings on diagnostic imaging of heart and coronary circulation: Secondary | ICD-10-CM | POA: Diagnosis not present

## 2023-12-19 DIAGNOSIS — G928 Other toxic encephalopathy: Secondary | ICD-10-CM | POA: Diagnosis not present

## 2023-12-19 DIAGNOSIS — N2889 Other specified disorders of kidney and ureter: Secondary | ICD-10-CM | POA: Diagnosis not present

## 2023-12-19 DIAGNOSIS — R4182 Altered mental status, unspecified: Secondary | ICD-10-CM | POA: Diagnosis not present

## 2023-12-19 DIAGNOSIS — F05 Delirium due to known physiological condition: Secondary | ICD-10-CM | POA: Diagnosis not present

## 2023-12-19 DIAGNOSIS — K5939 Other megacolon: Secondary | ICD-10-CM | POA: Diagnosis not present

## 2023-12-19 DIAGNOSIS — N3289 Other specified disorders of bladder: Secondary | ICD-10-CM | POA: Diagnosis not present

## 2023-12-19 DIAGNOSIS — N13721 Vesicoureteral-reflux with reflux nephropathy without hydroureter, unilateral: Secondary | ICD-10-CM | POA: Diagnosis not present

## 2023-12-19 DIAGNOSIS — R1310 Dysphagia, unspecified: Secondary | ICD-10-CM | POA: Diagnosis not present

## 2023-12-19 DIAGNOSIS — N1330 Unspecified hydronephrosis: Secondary | ICD-10-CM | POA: Diagnosis not present

## 2023-12-19 DIAGNOSIS — Z4659 Encounter for fitting and adjustment of other gastrointestinal appliance and device: Secondary | ICD-10-CM | POA: Diagnosis not present

## 2023-12-19 DIAGNOSIS — E46 Unspecified protein-calorie malnutrition: Secondary | ICD-10-CM | POA: Diagnosis not present

## 2023-12-19 DIAGNOSIS — N179 Acute kidney failure, unspecified: Secondary | ICD-10-CM | POA: Diagnosis not present

## 2023-12-19 DIAGNOSIS — H61303 Acquired stenosis of external ear canal, unspecified, bilateral: Secondary | ICD-10-CM | POA: Diagnosis not present

## 2023-12-19 DIAGNOSIS — Z931 Gastrostomy status: Secondary | ICD-10-CM | POA: Diagnosis not present

## 2023-12-19 DIAGNOSIS — R918 Other nonspecific abnormal finding of lung field: Secondary | ICD-10-CM | POA: Diagnosis not present

## 2023-12-19 DIAGNOSIS — I10 Essential (primary) hypertension: Secondary | ICD-10-CM | POA: Diagnosis not present

## 2023-12-19 DIAGNOSIS — N400 Enlarged prostate without lower urinary tract symptoms: Secondary | ICD-10-CM | POA: Diagnosis not present

## 2023-12-19 DIAGNOSIS — R310 Gross hematuria: Secondary | ICD-10-CM | POA: Diagnosis not present

## 2023-12-19 DIAGNOSIS — J90 Pleural effusion, not elsewhere classified: Secondary | ICD-10-CM | POA: Diagnosis not present

## 2023-12-20 ENCOUNTER — Encounter: Payer: Self-pay | Admitting: Internal Medicine

## 2023-12-20 DIAGNOSIS — R4182 Altered mental status, unspecified: Secondary | ICD-10-CM | POA: Diagnosis not present

## 2023-12-20 DIAGNOSIS — H61303 Acquired stenosis of external ear canal, unspecified, bilateral: Secondary | ICD-10-CM | POA: Diagnosis not present

## 2023-12-20 DIAGNOSIS — I10 Essential (primary) hypertension: Secondary | ICD-10-CM | POA: Diagnosis not present

## 2023-12-20 DIAGNOSIS — N400 Enlarged prostate without lower urinary tract symptoms: Secondary | ICD-10-CM | POA: Diagnosis not present

## 2023-12-20 DIAGNOSIS — F05 Delirium due to known physiological condition: Secondary | ICD-10-CM | POA: Diagnosis not present

## 2023-12-20 DIAGNOSIS — D649 Anemia, unspecified: Secondary | ICD-10-CM | POA: Diagnosis not present

## 2023-12-20 DIAGNOSIS — N1330 Unspecified hydronephrosis: Secondary | ICD-10-CM | POA: Diagnosis not present

## 2023-12-20 DIAGNOSIS — N13721 Vesicoureteral-reflux with reflux nephropathy without hydroureter, unilateral: Secondary | ICD-10-CM | POA: Diagnosis not present

## 2023-12-20 DIAGNOSIS — N2889 Other specified disorders of kidney and ureter: Secondary | ICD-10-CM | POA: Diagnosis not present

## 2023-12-20 DIAGNOSIS — R1310 Dysphagia, unspecified: Secondary | ICD-10-CM | POA: Diagnosis not present

## 2023-12-20 DIAGNOSIS — N179 Acute kidney failure, unspecified: Secondary | ICD-10-CM | POA: Diagnosis not present

## 2023-12-20 DIAGNOSIS — N186 End stage renal disease: Secondary | ICD-10-CM | POA: Diagnosis not present

## 2023-12-20 DIAGNOSIS — G928 Other toxic encephalopathy: Secondary | ICD-10-CM | POA: Diagnosis not present

## 2023-12-20 DIAGNOSIS — R310 Gross hematuria: Secondary | ICD-10-CM | POA: Diagnosis not present

## 2023-12-20 DIAGNOSIS — N3289 Other specified disorders of bladder: Secondary | ICD-10-CM | POA: Diagnosis not present

## 2023-12-20 DIAGNOSIS — E46 Unspecified protein-calorie malnutrition: Secondary | ICD-10-CM | POA: Diagnosis not present

## 2023-12-21 DIAGNOSIS — D62 Acute posthemorrhagic anemia: Secondary | ICD-10-CM | POA: Diagnosis not present

## 2023-12-21 DIAGNOSIS — R310 Gross hematuria: Secondary | ICD-10-CM | POA: Diagnosis not present

## 2023-12-21 DIAGNOSIS — K220 Achalasia of cardia: Secondary | ICD-10-CM | POA: Diagnosis not present

## 2023-12-21 DIAGNOSIS — R1310 Dysphagia, unspecified: Secondary | ICD-10-CM | POA: Diagnosis not present

## 2023-12-21 DIAGNOSIS — D649 Anemia, unspecified: Secondary | ICD-10-CM | POA: Diagnosis not present

## 2023-12-21 DIAGNOSIS — R319 Hematuria, unspecified: Secondary | ICD-10-CM | POA: Diagnosis not present

## 2023-12-21 DIAGNOSIS — I10 Essential (primary) hypertension: Secondary | ICD-10-CM | POA: Diagnosis not present

## 2023-12-21 DIAGNOSIS — F05 Delirium due to known physiological condition: Secondary | ICD-10-CM | POA: Diagnosis not present

## 2023-12-21 DIAGNOSIS — G928 Other toxic encephalopathy: Secondary | ICD-10-CM | POA: Diagnosis not present

## 2023-12-21 DIAGNOSIS — N179 Acute kidney failure, unspecified: Secondary | ICD-10-CM | POA: Diagnosis not present

## 2023-12-21 DIAGNOSIS — J9602 Acute respiratory failure with hypercapnia: Secondary | ICD-10-CM | POA: Diagnosis not present

## 2023-12-21 DIAGNOSIS — N1330 Unspecified hydronephrosis: Secondary | ICD-10-CM | POA: Diagnosis not present

## 2023-12-21 DIAGNOSIS — R4182 Altered mental status, unspecified: Secondary | ICD-10-CM | POA: Diagnosis not present

## 2023-12-21 DIAGNOSIS — Z992 Dependence on renal dialysis: Secondary | ICD-10-CM | POA: Diagnosis not present

## 2023-12-21 DIAGNOSIS — N2889 Other specified disorders of kidney and ureter: Secondary | ICD-10-CM | POA: Diagnosis not present

## 2023-12-21 NOTE — Discharge Summary (Addendum)
 [facility] Medicine Discharge Summary  Admit Date: 12/16/2023 Discharge Date: 01/06/2024  Admitting Physician: Andriette Earl, MD Discharge Physician: Doyal JAYSON Cola, MD  Primary Care Provider: Jimmy Charlie FERNS, MD, Phone [phone]  Discharge Destination: SNF   Admission Diagnoses:  Altered mental status, unspecified altered mental status type [R41.82]  Discharge Diagnoses:  Muscle invasive urothelial carcinoma  Achalasia PEG dependent  Foley dependent   Primary Diagnosis: Admitted for  encephalopathy, AKI, b/l hydroureteronephrosis, forniceal rupture and b/l urinomas. Diagnosed with bladder cancer: muscle invasive urothealial carcinoma.   HIGH RISK OF UNPLANNED READMISSION <30 DAYS FROM THIS DISCHARGE   GOC extensively discussed with HCPOA. Daily conversations in which she was told he is delirious, dying from bladder cancer for which currently he is not a surgical or chemo candidate due to functional status. It is very unlikely that he will ever recover his functionality to be able to withstand cancer treatment. HCPOA was offered palliative care consult every day this hospital stay and she firmly declined. See GOC at the end of the note.   Changes Made (with rationale):  Foley remains in place  PEG remains in place  COMPLETE NPO - high risk of aspiration ALL MEDS THROUGH G TUBE   See meds below in asessment and plan   Torsemide every other day - he retains fluids from TF and develops edema  Vitamins to support BM recovery  Caffeine, trazodone , melatonin for delirium management   Nutrition Interventions / Recommendations:  Begin Nutren 1.5 at 60 mL q3h. Increase by 60 mL q3h to a goal volume of 240 mL q3h x 6 feeds/day from (9399-7899).  Provides 2160 kcal, 98 g protein, 253 g CHO, 1100 mL free water  Min 30 mL FWF q3h, 60 mL FWF q3h per primary team If renal status acutely worsens can transition to Novasource Renal at 180 mL q3h x 6 feeds/day from  (9399-7899) Provides 1080 mL, 2160 kcal, 98 g protein, 774 mL free water  Min 30 mL FWF q3h Obtain weekly weight to trend   To-Do List (incidental findings, follow-up studies, etc.): Needs caffeine in am, trazodone  at 2 PM and melatonin at 8 PM to be calm and cooperative Needs outpatient followup with PCP, oncology and urology     Anticipatory Guidance for Outpatient Care:  CBC CMP and Mg every 5 days for 3 times then per urology and oncology     Results Pending at Discharge:  None Please see phone numbers at end of this summary for lab contact information.   Follow-up/Care Transition Plan: Sched. appts: Future Appointments  Date Time Provider Department Center  12/21/2023  7:30 AM [facility]  12/24/2023  7:30 AM [facility]    Follow-up info: No follow-up provider specified.     Allergies/Intolerances:  Allergies  Allergen Reactions  . Sulfa (Sulfonamide Antibiotics) Other (See Comments)     New Adverse Drug Events: none  Medications:     Current Discharge Medication List     START taking these medications      Instructions  acetaminophen  325 MG tablet Refills: 0 Replaces: acetaminophen  650 MG ER tablet  Commonly known as: TYLENOL  Take 3 tablets (975 mg total) by mouth 3 (three) times a day Last time this was given: 975 mg on January 06, 2024  5:57 AM   caffeine 200 mg tablet Quantity: 1 tablet Refills: 0  Commonly known as: VIVARIN Please give HALF of a tablet via G tube  daily at 8 AM. Last time this was given: 100 mg on January 06, 2024  9:23 AM   calcium  carbonate-vitamin D3 250 mg-3 mcg (120 unit) tablet Refills: 0  Take 1 tablet by G tube once daily Last time this was given: 1 tablet on January 06, 2024  9:23 AM   cholecalciferol 1000 unit tablet Refills: 0  Commonly known as: VITAMIN D3 Take 1 tablet (1,000 Units total) by mouth once daily Please give via G tube Last time this was  given: 1,000 Units on January 06, 2024  9:23 AM   cyanocobalamin  100 MCG tablet Refills: 0  Commonly known as: VITAMIN B12 Take 1 tablet (100 mcg total) by G tube once daily Last time this was given: 100 mcg on January 06, 2024  9:23 AM   docusate 50 mg/5 mL liquid Refills: 0 Stop taking on: January 16, 2024 Replaces: docusate 50 MG capsule  Commonly known as: COLACE Take 5 mLs (50 mg total) by mouth 2 (two) times daily as needed for Constipation (If he has not had a BM >24 hours) for up to 10 days Last time this was given: 50 mg on January 01, 2024  8:25 PM   enoxaparin  40 mg/0.4 mL injection syringe Refills: 0  Commonly known as: LOVENOX  Inject 0.4 mLs (40 mg total) subcutaneously once daily in abdomen, rotating site with each injection.   ferrous fumarate 324 mg (106 mg iron) tablet Refills: 0  Take 1 tablet (324 mg total) by G tube daily with breakfast Last time this was given: 324 mg on January 06, 2024  9:23 AM   folic acid  1 MG tablet Refills: 0  Commonly known as: FOLVITE  Take 1 tablet (1 mg total) by G tube once daily Last time this was given: 1 mg on January 06, 2024  9:23 AM   lactobacillus rhamnosus GG 5 billion cell Refills: 0  Commonly known as: Culturelle Kids Take 1 packet by mouth once daily Please give via G tube Last time this was given: 1 packet on January 06, 2024  9:24 AM   magnesium oxide 400 mg (241.3 mg magnesium) tablet Refills: 0  Commonly known as: MAG-OX Take 1 tablet (400 mg total) by G tube 2 (two) times daily Last time this was given: 400 mg on January 06, 2024  9:23 AM   melatonin 1 mg/mL oral solution Refills: 0  Take 3 mLs (3 mg total) by G tube at bedtime Last time this was given: 3 mg on January 05, 2024  9:22 PM   omeprazole  2 mg/mL oral suspension Refills: 0 Replaces: lansoprazole  30 MG DR capsule  Take 20 mLs (40 mg total) by tube once daily Last time this was given: 40 mg on January 06, 2024  9:45 AM   ondansetron  4 MG  disintegrating tablet Refills: 0 Stop taking on: January 13, 2024  Commonly known as: ZOFRAN -ODT Take 1 tablet (4 mg total) by mouth every 8 (eight) hours as needed for Nausea or Vomiting for up to 7 days   simethicone  40 mg/0.6 mL oral suspension Quantity: 36 mL Refills: 0 Stop taking on: January 16, 2024  Commonly known as: MYLICON Take 1.2 mLs (80 mg total) by G tube 3 (three) times daily for 10 days Last time this was given: 80 mg on January 06, 2024  5:57 AM   thiamine  100 MG tablet Refills: 0  Commonly known as: Vitamin B-1 Take 1 tablet (100 mg total) by G tube once daily Last  time this was given: 100 mg on January 06, 2024  9:23 AM   TORsemide 10 MG tablet Refills: 0  Commonly known as: DEMADEX Take 1 tablet (10 mg total) by G tube every other day Last time this was given: 10 mg on January 06, 2024  9:44 AM   traZODone  50 MG tablet Refills: 0  Commonly known as: DESYREL  Take 0.5 tablets (25 mg total) by G tube once daily Last time this was given: 25 mg on January 05, 2024  4:13 PM       CONTINUE taking these medications      Instructions  aspirin  81 MG EC tablet Refills: 0  Take 81 mg by mouth once daily Last time this was given: Ask your nurse or doctor   fluticasone propionate 50 mcg/actuation nasal spray Quantity: 16 g Refills: 3  Commonly known as: FLONASE Place 1 spray into both nostrils 2 (two) times daily       STOP taking these medications    acetaminophen  650 MG ER tablet Commonly known as: TYLENOL  Replaced by: acetaminophen  325 MG tablet   amLODIPine  2.5 MG tablet Commonly known as: NORVASC    dilTIAZem  120 MG XR capsule Commonly known as: CARDIZEM  CD   docusate 50 MG capsule Commonly known as: COLACE Replaced by: docusate 50 mg/5 mL liquid   glucosamine sul-chondroitn-msm 500-200-150 mg Tab   guaiFENesin  1,200 mg Ta12   lansoprazole  30 MG DR capsule Commonly known as: PREVACID  Replaced by: omeprazole  2 mg/mL oral suspension    multivitamin with minerals tablet   tadalafiL  20 MG tablet Commonly known as: CIALIS          Brief History of Present Illness:   From admission H and P:  Robert Vega is a 83 y.o. Robert HTN, GERD, dysphagia, new ESRD on dialysis who was brought to [facility] ED after being signed out AMA from [facility].   Patient was undergoing work-up for progressive dysphagia in the outpatient setting. Had EGD 08/2020 with abnormal cricopharyngeus, decreased esophageal motility and spastic LES. Had EGD 03/2022 with food in upper esophagus that was complicated by aspiration event leading to cardiac arrest. Post-resuscitation, EGD showed lack of peristalsis.    Per discharge summary in Care Everywhere, patient presented 10/05/2023 with sepsis 2/2 aspiration PNA leading to respiratory arrest (no CPR) with intubation and ICU admission. He had botox  injection to LES on 5/20. Esophagram 5/21 showed diffusely distended esophagus with distal obstruction and severe dysmotility suggestive of achalasia. He underwent PEG tube placement 5/13. Course complicated by AKI which progressed to ESRD necessitating initiation of iHD via R tunneled catheter. Course also complicated by severe delirium resulting in self-removal of G-tube requiring surgical replacement with ex-lap. Had waxing and waning mental status throughout admission, neurology consulted who felt it was toxic metabolic. Thought possibly uremic given persistently elevated BUN despite HD. Palliative care was consulted to facilitate GOC discussion but granddaughter [name] (reported HCPOA) not interested in hospice at this time. He received the following abx courses during his stay for recurrent PNA and UTI:   Unasyn  6/16-20 Azithromycin  x 1 Cefepime  6/30-7/3 Ceftriaxone  5/10-14 Metronidazole  5/10, 6/30-7/3 Zosyn  6/11, 5/26-31, 7/3-15 Vancomycin  6/30-7/5    Unable to get in touch with family on admission to corroborate story, but per  discharge summary patient was stable and awaiting discharged to LTC when granddaughter arranged for transport to [facility] ED and signed patient out against medical advice.    In the ED here, T 36.4, BP 130-160s/70s, HR 70s,  RR 30s, SpO2 92% on 2L [place]. Labs showed BUN 101, WBC 11.5. CXR showed bilateral lower lobe opacities c/f atelectasis vs. infection and mild pulmonary edema. CT head showed air fluid level in R sphenoid sinus, no acute intracranial abnormalities. Nephrology consulted, planning for HD tomorrow. Admitted to gen med for further management.  _____________________   Hospital Course by Problem:  Kahlel Peake is a 83 y.o. Robert with prolonged outside hospital stay when he presented with sepsis due to aspiration PNA c/b respiratory arrest requiring intubation, PEG placement for esophageal dysmotility, AKI to ESRD needing iHD, delirium c/b self-removal of G tube requiring surgical replacement with ex-lap, who is discharged AMA by family member and brought to [facility] for continuation of care, and noted to have bilateral forniceal rupture on imaging for which urology was consulted who performed cystoscopy with TURBT showing muscle-invasive urothelial carcinoma.    Altered mental status - improving  Suspected encephalopathy 2/2 uremia (so toxic encephalopathy initially) - now off dialysis Hypoactive/hyperactive delirium  Prior to hospitalization in may - reported as ambulatory by [facility], records show extensive esophageal disease and likely a brewing bladder cancer)  CT head on admission 7/21 without acute findings  Now after prolonged hospitalization of more than 2 months and half, PEG dependent due to severe achalasia, bladder cancer w/ Foley, now bed bound, intermittent aspiration Geriatrics evaluated Caffeine 100 mg QAM  Trazodone  25 mg 2PM on schedule  Melatonin at night  Hearing aid    Recurrent leukocytosis  Biopsy proven showing muscle-invasive urothelial carcinoma B/l  hydrourerteronephrosis with forniceal rupture and urinomas History of recent Aspiration/HAP, E faecalis UTI  Recurrent aspiration  Admitted to OSH with AKI , required iHD  CT w/ b/l hydrourerteronephrosis with forniceal rupture, urinomas and enhancing lesion from the bladder base possibly prostate vs urothelial origin.   Cystoscopy 7/29 with transurethral resection of bladder tumor, bilateral retrograde pyelography, and placement of bilateral ureteral stents with clot evacuation and fulguration Now with improving renal function with obstruction relieved - off iHD  Had uptrending WBC and started on zosyn  pending urology procedure, completed 8/7 Due to rigoring and persistent leucocytosis - repeat CT chest abd pelvis 8/7 indicated ongoing aspiration, improving R urinoma, L urinoma is larger  Left urinoma discussed with urology, body IR and ID 8/8: ID recommends against aspiration, urology feels is reasonable to aspirate The patient's Health Care Power of Attorney [facility]) has stipulated that the procedure be performed under general anesthesia only, citing prior information from physicians at [facility] that the patient's esophagus is structurally compromised and may collapse under procedural stress, leading to potential intraoperative code. Given the patient's significant delirium, he is not an appropriate candidate for local anesthesia alone. If this urinoma is indeed infected, there is a substantial risk of rapid clinical decline, but he has remained stable over the past few days, afebrile and with normal WBC count. Decision was made to defer this procedure given overall stability  [ ]  reassess with urology at the time of followup  Per ID - his main risk of infection is from recurrent aspiration    SVC DVT at prior dialysis line  Superficial clot in bilateral cephalic veins  DVT PPX level of lovenox  given immobility per HCPOA preference  CT PE without PE  UE and LE US  without DVT     Urothelial  carcinoma - newly diagnosis Path from OR confirms Will need to regain functional status prior to any surgical or systemic management - medical onc and urology both indicated this  [ ]   fu w/ urology and oncology    Gross Hematuria - much improved  Suspect due to underlying bladder cancer Now s/p fulguration Foley catheter replaced due to failed TOV  Do NOT remove foley without discussing with urology  Urine yellow and clear at discharge    Anemia, multifactorial Acute blood loss anemia  Hgb 7-8 per CareEverywhere at OSH  Initially was transfusion dependent prior to urologic procedure due to hematuria 1 PRBC 8/3 for Hb 6.9  Iron, folic acid , vitamin B12, thiamine     Dysphagia Achalasia s/p PEG Malpositioned G tube, resolved  Was undergoing extended outpatient work-up At OSH, had botox  injection to LES on 5/20 Esophagram 5/21 showed diffusely distended esophagus with distal obstruction and severe dysmotility suggestive of achalasia At OSH had PEG which he self removed and had to undergo surgical G tube placement.  Continue omeprazole  Patient accidentally tugged on G tube on 07/23 and it came out by about 1-2inches. IR repositioned at bedside on 7/24 and tube feeds resumed. Based on his chronic esophageal issues now that a g tube is in place and is receiving adequate nutrition would defer oral intake trials until patient is much improved.  Likely after course of rehab.  Otherwise setting him up for potential complication that could set back his recovery [ ]  Discussed outpatient referral to GI with his granddaughter at time of discharge Nutrition Interventions / Recommendations:  Begin Nutren 1.5 at 60 mL q3h. Increase by 60 mL q3h to a goal volume of 240 mL q3h x 6 feeds/day from (9399-7899).  Provides 2160 kcal, 98 g protein, 253 g CHO, 1100 mL free water  Min 30 mL FWF q3h, 60 mL FWF q3h per primary team If renal status acutely worsens can transition to Novasource Renal at 180 mL q3h x  6 feeds/day from (9399-7899) Provides 1080 mL, 2160 kcal, 98 g protein, 774 mL free water  Min 30 mL FWF q3h Obtain weekly weight to trend   Hypokalemia Replete and monitor   Malnutrition Hypoalbuminemia PEG dependent Diarrhea thought 2/2 tube feeding PEG placed 5/21 then removed by patient and replaced surgically.  nutrition consulted Tube feeding   HTN hold home diltiazem  given soft BP and intermittent hypotension    Goals of care:  The patient was reportedly functioning well at home prior to his admission to [place] (per [facility])  However, his history includes severe esophageal dysfunction with respiratory arrest, prior intubation and likely has had symptoms related to the bladder cancer, the latter contributing to urinoma formation through obstructive effects on renal outflow. Per chart review and discussions with prior providers at [facility], the patient's clinical course has remained tenuous when in their care. Mostly lethargic, confused He was previously hospitalized for two months at [facility] and was discharged against medical advice (AMA) by family. He had a PEG placed for achalasia.  He was subsequently brought to [facility], where he was diagnosed with bladder cancer. He is currently PEG-dependent for nutrition, Foley catheter-dependent for urinary drainage, and remains NPO.  In collaboration with oncology, urology, and PT/OT, I concur that the patient is unlikely to regain sufficient strength to pursue any form of curative oncologic therapy.  At best, I anticipate a brief period of temporary clinical stabilization, likely resulting in discharge to a skilled nursing facility for no more than a few days before probable re-hospitalization due to medical decompensation. It is my concern that pursuing further interventions aimed at short-term stabilization may serve only to prolong the patient's suffering without meaningful clinical benefit.  Nonetheless,  the HCPOA has consistently declined  palliative or hospice measures when offered as part of comprehensive care planning I have had ongoing, detailed discussions with the HCPOA regarding the patient's condition, prognosis, and treatment plan. I have expressed my concern that her grandfather appears to be gradually declining, and I have explained the underlying medical reasons contributing to his deterioration. The HCPOA remains hopeful, particularly noting that the patient is now off dialysis and perceiving some improvement in his mental status. She has shared that she speaks with him regularly, and that when asked if he wishes to continue fighting, he responded affirmatively. She has also indicated that she has not disclosed his cancer diagnosis to him. I have made several attempts to assess the patient's understanding of his condition and to engage him in conversations about his illness. However, during all of my evaluations, he has remained consistently confused and delirious. Based on these encounters, he lacks the capacity to make informed medical decisions at this time.   Surgeries and Procedures Performed:  TURB  _____________________  Discharge Exam:  BP 122/64   Pulse 80   Temp 36.6 C (97.9 F) (Axillary)   Resp 18   Wt (!) 105.4 kg (232 lb 5.8 oz)   SpO2 97%   BMI 34.82 kg/m  O2 Device: BiPAP (12/20/23 2139)   Pertinent Lab Testing: Recent Labs  Lab 12/18/23 0341 12/19/23 0445 12/20/23 0153  NA 131* 130* 133*  K 4.3 4.2 3.9  CL 96* 94* 96*  CO2 20* 22 25  BUN 70* 82* 39*  CREATININE 5.3* 6.7* 4.3*  GLUCOSE 90 94 122  CALCIUM  8.2* 8.1* 8.0*   Recent Labs  Lab 12/16/23 1333 12/17/23 0059 12/17/23 1333  AST 36 27 28  ALT 25 24 22   ALKPHOS 102 81 75  TBILI 0.4 0.5 0.7    Recent Labs  Lab 12/19/23 0445 12/20/23 0153 12/20/23 0608 12/20/23 1717  WBC 12.7* 14.1* 14.2*  --   HGB 7.6* 7.2* 6.7* 7.6*  HCT 24.1* 23.5* 21.9* 24.7*  PLT 396 352 333  --    Recent Labs  Lab 12/16/23 1333  12/16/23 1825 12/17/23 0059  APTT 27.1  --  26.4*  INR 1.0 1.1  --      Other Pertinent Labs:    Micro:  Lab Results  Component Value Date   BLDCULT No growth at 3 days 01/02/2024   BLDCULT No growth at 3 days 01/02/2024   BLDCULT No growth detected. 12/16/2023   BLDCULT No growth detected. 12/16/2023    No results found for: CDIFPCR, CDIFFTOXEIA     Pertinent Imaging:   US  lower extremity venous bilateral Result Date: 01/05/2024 Procedure: US  LOWER EXTREMITY VENOUS BILATERAL Indication:  r/o DVT, C67.9 Malignant neoplasm of bladder, unspecified (CMS/HHS-HCC) Comparison: None Technique: Gray-scale ultrasound and color Doppler images of the deep veins of the bilateral lower extremities from the level of the common femoral vein to the level of the popliteal vein. Spectral Doppler evaluation of bilateral common femoral and popliteal veins. Gray-scale and color Doppler images of bilateral posterior tibial and peroneal veins. Findings: Spectral Doppler evaluation of the bilateral common femoral veins demonstrates symmetric waveforms. RIGHT:     Common femoral vein: Compressible. No thrombus seen.     Proximal portion of the deep femoral vein: No thrombus seen.     Proximal portion of the great saphenous vein: No thrombus seen.     Femoral vein: Compressible. No thrombus seen.     Popliteal vein: Compressible. No  thrombus seen. The distal popliteal vein not well seen.     Visualized portions of the posterior tibial and peroneal veins: Limited visualization of the posterior tibial and peroneal veins. No definite thrombus is identified. LEFT:     Common femoral vein: Compressible. No thrombus seen.     Proximal portion of the deep femoral vein: No thrombus seen.     Proximal portion of the great saphenous vein: No thrombus seen.     Femoral vein: Compressible. No thrombus seen.     Popliteal vein: Compressible. No thrombus seen.     Visualized portions of the posterior tibial and peroneal veins:  Limited visualization of the posterior tibial and peroneal veins. No definite thrombus is identified. Impression: 1. No deep vein thrombosis identified in the bilateral common femoral, femoral, and popliteal veins. 2. Markedly limited evaluation of the bilateral posterior tibial and peroneal veins, with no definite evidence of thrombus in the visualized segments. Electronically Reviewed by:  Jerrell Senior, MD, [facility] Electronically Reviewed on:  01/05/2024 10:24 AM I have reviewed the images and concur with the above findings. Electronically Signed by:  Lauraine Ned, MD, [facility] Electronically Signed on:  01/05/2024 8:36 PM  US  upper extremity venous bilateral Result Date: 01/05/2024 Procedure: US  UPPER EXTREMITY VENOUS BILATERAL Indication: r/o DVT, C67.9 Malignant neoplasm of bladder, unspecified (CMS/HHS-HCC) Comparison: None Technique: Gray-scale, color Doppler, and spectral waveform tracings of bilateral internal jugular veins, brachiocephalic veins, subclavian veins, axillary veins, and brachial veins. In addition, superficial veins including the basilic veins and cephalic veins in the upper arms were imaged. Findings: Spectral Doppler evaluation of the bilateral subclavian veins demonstrates symmetric waveforms. RIGHT:   Internal jugular vein: Compressible. No thrombus visualized.   Brachiocephalic vein: No thrombus visualized.   Subclavian vein: No thrombus visualized.   Axillary vein: Compressible. No thrombus visualized.   Brachial veins: Compressible. No thrombus visualized.   Visualized portions of the basilic and cephalic veins: Right basilic vein is not imaged due to patient positioning. There is occlusive thrombus of the right cephalic vein throughout its entire course. LEFT:   Internal jugular vein: Compressible. No thrombus visualized.    Brachiocephalic vein: No thrombus visualized.   Subclavian vein: No thrombus visualized.   Axillary vein: Compressible. No thrombus visualized.    Brachial vein: Compressible. No thrombus visualized.   Visualized portions of the basilic and cephalic veins: The left cephalic vein is incompletely compressible with nonocclusive thrombus. Impression: 1.  Superficial occlusive thrombosis of the right cephalic vein. 2.  Superficial nonocclusive thrombosis of the left cephalic vein. 3.  No deep venous thrombosis of either upper extremity. Electronically Reviewed by:  Jerrell Senior, MD, [facility] Electronically Reviewed on:  01/05/2024 10:24 AM I have reviewed the images and concur with the above findings. Electronically Signed by:  Lauraine Ned, MD, [facility] Electronically Signed on:  01/05/2024 12:39 PM  CT chest PE protocol incl CT angiogram chest w wo contrast Result Date: 01/05/2024 CT CHEST PE  PROTOCOL INCL CT CHEST ANGIOGRAM W WO CONTRAST INDICATION: Pulmonary embolism (PE) suspected, high prob, R06.02 Shortness of breath COMPARISON:  CT chest abdomen and pelvis 01/02/2024 and CT abdomen/pelvis 12/16/2023 PROTOCOL:  Chest CTA PE Protocol.  Contiguous axial images were obtained from the neck base through the upper abdomen following intravenous administration of iodinated contrast material.  If IV contrast material had not been administered, the likelihood of detecting abnormalities relevant to the patient's condition would have been substantially decreased.  3-D reconstructions were likewise performed and  indicated to increase the sensitivity of detecting clinically relevant pathology. FINDINGS: SCOUT IMAGES: No unexpected findings. PULMONARY ARTERIES: No pulmonary embolism to the level of the segmental pulmonary arteries. Limited evaluation of the distal subsegmental pulmonary arteries due to contrast bolus timing. LOWER NECK: No suspicious-appearing cervical lymph nodes. LUNGS/AIRWAYS/PLEURA: Patent central airways. There is bronchial wall thickening and debris plugging the distal airways of the bilateral lower lobes. Associated mild groundglass  opacities and atelectasis in the bilateral lower lobes are decreased from prior. A 1 cm nodule at the base of the middle lobe abutting the diaphragm is unchanged compared to 12/16/2023 (series 7, image 190). Another small juxtapleural nodules are unchanged from most recent prior. Unchanged small left pleural effusion. MEDIASTINUM/HEART/VESSELS: Dilated patulous esophagus containing a large amount of debris. No pericardial effusion. Severe three-vessel coronary artery calcifications. VISIBLE ABDOMEN: Unremarkable. BONES/SOFT TISSUES: Unremarkable soft tissues. Severe arthrosis of the right shoulder. Exaggerated thoracic kyphosis. No suspicious bony lesions. IMPRESSION: 1.  Sequelae of aspiration/endobronchial infection of the bilateral lower lobes, decreased compared to 01/02/2024. 2.  Dilated patulous esophagus places patient at increased risk for aspiration. 3.  No pulmonary embolism to the level of the segmental pulmonary arteries. The preliminary report (critical or emergent communication) was reviewed prior to this dictation and there are no critical differences between the preliminary results and the impressions in this final report. Electronically Reviewed by:  Toribio Beagle, MD, [facility] Electronically Reviewed on:  01/05/2024 10:59 AM I have reviewed the images and concur with the above findings. Electronically Signed by:  Caron Shove, MD, [facility] Electronically Signed on:  01/05/2024 11:49 AM  X-ray abdomen AP portable Result Date: 01/04/2024 XR abdomen AP Comparison: Abdominal radiograph 12/31/2023 Indication: G tube was pulled a little, R14.0 Abdominal distension (gaseous) Number of views: One view, portable supine abdomen AP Findings: Redemonstrated bilateral nephroureteral stents in stable position. Gastrotomy tube visualized coursing over the mid abdomen, radiopaque balloon not visualized. Nonobstructive bowel gas. No acute osseous abnormalities. Persistently enlarged cardiomediastinal  silhouette and bibasilar atelectasis. Limited evaluation for pneumoperitoneum on this supine exam. Impression: Gastrotomy tube not well visualized, favor left upper quadrant. The preliminary report (critical or emergent communication) was reviewed prior to this dictation and there are no critical differences between the preliminary results and the impressions in this final report. Electronically Reviewed by:  Arthea Glatter, MD, [facility] Electronically Reviewed on:  01/04/2024 11:34 AM I have reviewed the images and concur with the above findings. Electronically Signed by:  Lauraine Ned, MD, [facility] Electronically Signed on:  01/04/2024 9:04 PM  CT chest abdomen pelvis with contrast w MIPS Result Date: 01/03/2024 Procedure: CT Chest with IV Contrast Procedure: CT Abdomen and Pelvis with IV Contrast Comparison: CT GU protocol 12/26/2023 Indication: c/f emerging sepsis, R41.82 Altered mental status, unspecified, R06.02 Shortness of breath Technique: CT imaging was performed of the chest, abdomen, and pelvis following the administration of intravenous contrast. Iodinated contrast was used due to the indications for the examination, to improve disease detection and to further define anatomy. 3-D maximal intensity projection (MIP) reconstructions of the chest were performed to potentially increase study sensitivity. Coronal and sagittal images were also generated and reviewed. Findings: Chest:  - Chest wall and Thoracic Inlet: No masses or lymphadenopathy. Adherent non-occlusive thrombus in the right internal jugular vein and within the SVC, likely related to prior intravenous catheter. - Mediastinum and Hila: No masses or lymphadenopathy. Dilated patulous esophagus which may predispose to aspiration. - Thoracic Vessels: Normal caliber of the thoracic aorta  and main pulmonary artery. Minimal scattered atherosclerotic plaque. Possible non-occlusive filling defect in a right upper lobe segmental branch is  poorly evaluated secondary to motion artifact. - Heart and Pericardium: Normal heart size. No pericardial effusion. Moderate calcified atherosclerotic plaque in the coronary arteries. - Lungs and Airways: Limited evaluation secondary to motion artifact. Bilateral septal thickening suggestive of edema. Patchy groundglass opacities in the right lower lobe. Dependent atelectasis bilaterally, most pronounced in the left lower lobe - Pleura: Small left pleural effusion. Abdomen and pelvis: - Liver: Normal in morphology and enhancement. No suspicious hepatic masses are identified. The portal and hepatic veins are patent. - Biliary and Gallbladder: No intrahepatic or extrahepatic bile duct dilatation. Cholelithiasis. - Spleen: Normal in appearance.  - Pancreas: Homogeneous enhancement. No ductal dilation. Cystic lesion in the body measures 1.6 cm, unchanged from prior. - Adrenal Glands: Normal in appearance. - Kidneys: Bilateral double-J stents terminating within the bladder. Interval decrease in size of a right perinephric fluid collection, now measuring 7.3 x 1.4 cm (3:27), previously measuring 10.2 x 3.4 cm. Increased size of a left perinephric fluid collection which now measures 11.4 x 3.8 cm, previously measuring 10.2 x 3.0 cm (3:142). No hydronephrosis. Persistent ill-defined peripelvic soft tissue stranding bilaterally, similar to prior. - Abdominal and Pelvic Vasculature: No abdominal aortic aneurysm. Mild atherosclerotic plaque. - Gastrointestinal Tract: No abnormal dilation or wall thickening. Unchanged positioning of a percutaneous gastrostomy tube with mild associated fat stranding, likely reactive. Appendix is normal. - Peritoneum/Mesentery/Retroperitoneum: Similar omental stranding in the upper abdomen, nonspecific. No free air. No significant free fluid. Similar bilateral perinephric stranding and presacral edema. - Lymph Nodes: No retroperitoneal or mesenteric lymphadenopathy.  - Bladder: Decompressed with  a Foley catheter in place. Thickened bladder wall with associated fat stranding. - Pelvic Organs: Unremarkable. - Body Wall: Unremarkable. - Musculoskeletal: No aggressive appearing osseous lesions. Mild degenerative changes of the spine. Impression: 1.  Interval increase in size of a left perinephric fluid collection and decreased size of right perinephric fluid collection. Interval resolution of bilateral hydronephrosis. 2.  Thickened decompressed bladder with adjacent fat stranding. Correlate with urinalysis to evaluate for cystitis. 3.  Possible non-occlusive filling defect in a right upper lobe segmental pulmonary artery branch is poorly evaluated secondary to motion artifact. Recommend dedicated chest PE study. 4.  Adherent non-occlusive thrombus in the right internal jugular vein and SVC, likely related to prior intravenous catheter. 5.  Patchy groundglass opacities in the right lower lobe may be secondary to atelectasis or infection/aspiration. Patulous esophagus predisposes the patient to aspiration. 6.  Small left pleural effusion. Findings were transmitted via epic chat with DIANA CRISTINA MEHEDINT by Lawayne Lent, MD, on 01/03/2024 at 2:07 PM. Electronically Reviewed by:  Lawayne Lent, MD, [facility] Electronically Reviewed on:  01/03/2024 2:08 PM I have reviewed the images and concur with the above findings. Electronically Signed by:  Shyrl Admire, MD, [facility] Electronically Signed on:  01/03/2024 2:18 PM  X-ray abdomen AP portable Result Date: 12/31/2023 XR abdomen AP Comparison: CT abdomen pelvis with contrast from December 26, 2023. Indication: reeval for constipation, R41.82 Altered mental status, unspecified Number of views: One view, portable supine abdomen AP Findings: Bilateral nephroureteral stents terminating in the pelvis. No significant stool burden. No acute osseous abnormality. Please see same day chest radiograph for independent interpretation of findings above the diaphragm.  Impression: Nonobstructive bowel gas pattern. No significant stool burden. Electronically Reviewed by:  Mabel Simmonds, MD, [facility] Electronically Reviewed on:  12/31/2023 9:37 AM  I have reviewed the images and concur with the above findings. Electronically Signed by:  Redell Dawn, MD, [facility] Electronically Signed on:  12/31/2023 10:59 AM  X-ray chest single view portable Result Date: 12/31/2023 Procedure: XR CHEST SINGLE VIEW PORTABLE Indication: Lung Aeration, R06.02 Shortness of breath Comparison: 12/19/2023 Findings/Impression: 1.  Right jugular venous catheters been removed. 2.  Increased diffuse interstitial opacities, likely edema. Similar bibasilar alveolar opacities, likely atelectasis. 3.  Unchanged layering left and possible right pleural effusion. No pneumothorax. 4.  Unchanged cardiomediastinal contours. Electronically Signed by:  Ozell Coil, MD, [facility] Electronically Signed on:  12/31/2023 9:59 AM  IR central venous catheter removal Result Date: 12/29/2023 EXAM: Tunneled central venous catheter removal INDICATION: HD, recovery of renal function Acute renal failure, unspecified acute renal failure type () [N17.9 (ICD-10-CM)] SEDATION / ANESTHESIA / MEDICATION: Level of sedation/anesthesia: No sedation Sedation / Anesthesia / Medication Administered:  Totals unavailable because the procedure time range is not set FLUOROSCOPY DOSE: Total fluoroscopy time: 0 minutes Dose area product: 0 mGy-cm2 Air kerma: 0 mGy COMPLICATIONS: None immediate PREPROCEDURE: Informed written consent was obtained after a discussion of the risks, benefits, and alternatives to the procedure.  A Time-Out was performed immediately prior to the procedure. TECHNIQUE AND FINDINGS: The catheter site was prepped and draped in the usual sterile fashion.  Using firm traction, the catheter was removed intact. Hemostasis was obtained with pressure. The access site was cleaned and dressed. The patient tolerated the  procedure well.       Successful removal of a tunneled central venous catheter. Electronically reviewed by: RODDIE GLENDIA BOHR, MD I, Camellia Czech, MD, PhD, attest that I was present for the key elements of the procedure and immediately available.  I reviewed the stored images and concur with the above findings.   CT GU protocol inc CT dual abd and pelvis with and without contrast Result Date: 12/26/2023 CT abdomen and pelvis, without and with IV contrast Comparison:  CT mesenteric ischemia protocol dated 12/19/2023. Indication:  Hydronephrosis, urinoma follow up after urinary obstruction relieved, N13.30 Unspecified hydronephrosis, R31.0 Gross hematuria. Technique:  CT imaging was performed of the abdomen and pelvis without and with IV contrast. A genitourinary protocol CT was performed, with noncontrast imaging from the kidneys through the bladder, nephrographic phase imaging through the kidneys, and excretory phase imaging from the kidneys through the bladder.   Iodinated contrast was used due to the indications for the examination, to improve disease detection and to further define anatomy. This examination was interpreted using coronal and sagittal reformatted images to optimize visualization of the collecting system and ureters. Findings: - Lower Thorax: Similar partially visualized small left and trace right pleural effusions with associated bibasilar opacities. - Liver: Normal in morphology and enhancement.  No suspicious hepatic masses are identified.  The portal and hepatic veins are patent. - Biliary and Gallbladder: No intrahepatic or extrahepatic bile duct dilatation. Cholelithiasis. - Spleen: Normal in appearance.  - Pancreas: Normal in appearance. - Adrenal Glands: Normal in appearance. - Kidneys: Symmetric in size and enhancement. No suspicious renal lesions. Interval placement of bilateral nephroureteral stents with similar moderate bilateral hydroureteronephrosis. Similar size of fluid  collection along the medial aspect of the right perinephric space measuring 10.2 x 3.4 cm with contrast filling on delayed phase imaging. Additional fluid collection along the posterior medial left perinephric space is decreased in size measuring 10.3 x 3.0 cm, previously 12.1 x 6.4 cm. No definite evidence of urine leak into the left collection,  though evaluation limited by contrast visualized within the proximal collecting system on delayed phase. - Abdominal and Pelvic Vasculature: No abdominal aortic aneurysm. - Gastrointestinal Tract: No abnormal dilation or wall thickening. Gastrostomy tube retention balloon is well seated in the gastric lumen. - Peritoneum/Mesentery/Retroperitoneum: No free fluid.  No free intraperitoneal air. - Lymph Nodes: No retroperitoneal, mesenteric, pelvic, or inguinal lymphadenopathy.  - Bladder: TURBT defect. Punctate focus of antidependent gas in the urinary bladder. - Pelvic Organs: Enhancing lesion in the region of the bladder/prostate is better evaluated on prior exam, correlate with cystoscopy/biopsy results. - Body Wall: Unremarkable. - Musculoskeletal:  No aggressive appearing osseous lesions. Impression: 1.  Interval placement of bilateral nephroureteral stents with similar moderate bilateral hydroureteronephrosis. 2.  Slightly decreased size of left and similar size of right perinephric urinomas with evidence of active urine leak into the right perinephric collection. No visualized urine leak into the left perinephric collection, though evaluation is limited by contrast only within the proximal collecting system at the time of imaging on delayed phase. Electronically Reviewed by:  Mabel Satterfield, MD, [facility] Electronically Reviewed on:  12/26/2023 3:57 PM I have reviewed the images and concur with the above findings. Electronically Signed by:  Franky Martin, MD, [facility] Electronically Signed on:  12/26/2023 7:56 PM  X-ray fluoro less than 1 hour Result  Date: 12/24/2023 This order has been auto-finalized. Please see additional clinical documentation for result report.  CT bowel mesenteric ischemia incl CT abd and pel w contrast Result Date: 12/20/2023 CT abdomen and pelvis with IV contrast Comparison: CT abdomen pelvis 12/16/2023. Indication: Abdominal abscess/infection suspected, Portal venous gas on xra abdomen. Evaluate for ischemic colitis, R14.0 Abdominal distension (gaseous). Technique: A mesenteric ischemia protocol CT of the abdomen and pelvis was performed. CT imaging of the abdomen and pelvis was performed in the arterial and venous phases after the administration of IV contrast. Iodinated contrast was used due to the indications for the examination, to improve disease detection and to further define anatomy. Coronal and sagittal images were produced from the axial data set and reviewed as part of the interpretation. Findings: - Lower Thorax: No suspicious pulmonary abnormalities. Left greater than right pleural effusions with subjacent atelectasis. - Liver: Normal in morphology and enhancement.  No suspicious hepatic masses are identified.  The portal and hepatic veins are patent. - Biliary and Gallbladder: No intrahepatic or extrahepatic bile duct dilatation. The gallbladder contains gallstones. - Spleen: Normal in appearance.  - Pancreas: Normal in appearance. - Adrenal Glands: Normal in appearance. - Kidneys: Symmetric in size. Moderate bilateral hydronephrosis. 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 The left urinoma measures 12.1 x 6.4 x 11.9 cm, which is slightly decreased in size. - Abdominal and Pelvic Vasculature: No abdominal aortic aneurysm. Incidentally the left gastric artery arises directly from the aorta. The celiac axis, superior mesenteric artery, bilateral renal arteries, and inferior mesenteric artery are patent. Small penetrating atherosclerotic ulcer  just distal to the takeoff of the IMA. - Gastrointestinal Tract: Gastrostomy tube within the stomach lumen. The retention disc is not flush with the skin. No CT evidence of bowel ischemia. - Peritoneum/Mesentery/Retroperitoneum: No free fluid.  No free intraperitoneal air. - Lymph Nodes: No retroperitoneal, mesenteric, pelvic, or inguinal lymphadenopathy.  - Bladder: Urinary bladder contains high density fluid. Small locules of intraluminal air. - Pelvic Organs: Unremarkable. - Body Wall: Unremarkable. - Musculoskeletal:  No aggressive appearing osseous lesions. Impression: 1.  No  CT evidence of mesenteric ischemia or portal venous gas. 2.  Redemonstrated bilateral hydroureteronephrosis with similar to slightly decreased size of bilateral urinomas. 3.  High density material with intraluminal air within the urinary bladder. In the setting of hematuria and urinary tract injury, this likely represents a combination of clotted blood and excreted contrast. The intraluminal air is likely iatrogenic. 4.  The retention disc of the G tube is not flush with the skin, recommend correlation with physical exam. Otherwise, the G tube is appropriately positioned in the stomach. The preliminary report (critical or emergent communication) was reviewed prior to this dictation and there are no critical differences between the preliminary results and the impressions in this final report. Electronically Reviewed by:  Richarda Pinal, MD, [facility] Electronically Reviewed on:  12/20/2023 10:48 AM I have reviewed the images and concur with the above findings. Electronically Signed by:  Verneita Pitter, MD, [facility] Electronically Signed on:  12/20/2023 11:04 AM  X-ray abdomen AP portable Result Date: 12/19/2023 XR abdomen AP Comparison: X-ray abdomen AP portable from December 18, 2023. 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 Faint enteric contrast opacifies the distal small bowel and right colon. Visualization of indwelling catheter. Previously seen gas overlying the liver no longer visualized. Mild colonic dilation. Nonobstructive bowel gas pattern. No acute osseous abnormality. Please see same day chest radiograph for interpretation of findings above the diaphragm. Impression: Very limited visualization of gastrostomy tube. Mild colonic dilation. Nonobstructive bowel gas pattern. Electronically Reviewed by:  Mabel Simmonds, MD, [facility] Electronically Reviewed on:  12/19/2023 4:00 PM I have reviewed the images and concur with the above findings. 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 If there is acute abdominal pain , CT abdomen and pelvis is recommended for further evaluation. Gastrostomy tubing projects over the left upper quadrant. Precise positioning cannot be determined. Recommend installation of contrast through the tube followed by immediate abdominal radiograph. Paucity of bowel gas limits evaluation for bowel pathology. Please see same day chest radiograph for findings in the thorax. Electronically Reviewed by:  Fairy Cohens, MD, [facility] Electronically Reviewed on:  12/19/2023 9:03 AM I have reviewed the images and concur with the above findings. Electronically Signed by:  Redell Dawn, MD, [facility] Electronically Signed on:  12/19/2023 2:23 PM  X-ray chest single view portable Result Date: 12/19/2023 PROCEDURE:  One-view (anteroposterior projection) chest. INDICATION: Lung  Aeration, Disease Progression, R41.82 Altered mental status, unspecified COMPARISON:  Previous day FINDINGS AND IMPRESSION:  1. Cardiac and mediastinal silhouette is enlarged and unchanged. 2. Bilateral opacities may represent mild edema. 3. Small LEFT effusion. Possible trace RIGHT pleural effusion. No pneumothorax present. 4. Support appliances are unchanged. Electronically Signed by:  Dorise Pali, MDPhD,[facility] Electronically Signed on:  12/19/2023 10:58 AM  X-ray chest single view portable Result Date: 12/18/2023 Procedure: Single frontal view of the chest. Indication: Lung Aeration, Disease Progression, R41.82 Altered mental status, unspecified, R06.02 Shortness of breath Comparison: 12/16/2023. Impression: 1. Enlarged cardiac and mediastinal contours probably stable allowing for differences in patient positioning. 2. Low lung volumes with vascular  crowding and bibasilar opacities, left greater than right, likely representing atelectasis. Possible mild edema. 3. Small left pleural effusion. No definite abnormality seen in the right pleural space. 4. Right internal jugular venous catheter remaining with tip projecting near the superior cavoatrial junction. Electronically Signed by:  Alfonse Sherwood , MD, [facility] Electronically Signed on:  12/18/2023 1:24 PM  CT abdomen pelvis with contrast Result Date: 12/17/2023 CT abdomen and pelvis with IV contrast Comparison:  None. Indication:  Abdominal abscess/infection suspected, R41.82 Altered mental status, unspecified. Technique:  CT imaging was performed of the abdomen and pelvis with intravenous contrast in the portal venous and delayed phases.  Iodinated contrast was used due to the indications for the examination, to improve disease detection and further define anatomy. Coronal and sagittal reformatted images were generated and reviewed. Findings: - Lower Thorax: Small bilateral pleural effusions passive atelectatic changes the  bilateral lung bases. No suspicious pulmonary nodule. Heart is normal in size. Moderate coronary artery atherosclerotic disease. No pericardial effusion. - Liver: Normal in morphology and enhancement.  No suspicious hepatic masses are identified.  The portal and hepatic veins are patent. - Biliary and Gallbladder: No intrahepatic or extrahepatic bile duct dilatation. The gallbladder contains gallstones. - Spleen: Normal in appearance.  - Pancreas: Normal in appearance. - Adrenal Glands: Normal in appearance. - Kidneys: Kidneys are symmetric in size. There is moderate bilateral hydronephrosis. Previously administered IV contrast excreted into the renal collecting systems. There is extravasation of excreted contrast into the bilateral retroperitoneal fat consistent with forniceal rupture and urinoma formation. Left urinoma measures approximately 12.8 x 7.2 x 11.7 cm. Right urinoma measures 9.9 x 2.9 x 8.0 cm. These urinomas appear grossly stable in size when compared to prior examination. - Abdominal and Pelvic Vasculature: No abdominal aortic aneurysm. Mild atherosclerotic plaque. - Gastrointestinal Tract: Gastrostomy tube with balloon inflated within the stomach lumen. Consider retraction of the gastrostomy tube by approximately 5.9 cm. Stomach and small bowel is normal in caliber. Small volumes stool burden within the colon. Normal right lower quadrant appendix. - Peritoneum/Mesentery/Retroperitoneum: No free fluid.  No free intraperitoneal air. - Lymph Nodes: No retroperitoneal, mesenteric, pelvic, or inguinal lymphadenopathy.  - Bladder: Urinary bladder is decompressed. Enhancing lesion of the prostate base, thought to originate from the prostate gland, causing obstruction of the bilateral ureterovesicular junctions (portal venous phase study Series 5 #141) . - Body Wall: Unremarkable. - Musculoskeletal:  No aggressive appearing osseous lesions. Spondylotic changes of the cervical lumbar spine with disc bulge  causing severe spinal canal stenosis at the level of L4-L5. Impression: 1.  Moderate bilateral hydroureteronephrosis complicated by forniceal rupture. Bilateral urinoma formation with peripheral enhancement. Superinfection is not excluded.  2.  Enhancing lesion of the urinary bladder base, possibly of prostate vs urothelial origin. This lesion causes obstruction of the bilateral ureterovesicular junctions. Consider further imaging with prostate MRI versus cystoscopy. 3.  Gastrostomy tube within the stomach lumen. Recommend retraction of the gastrostomy tube by 5.9 cm. Updated findings were discussed with Dr. Axel at 1018 on 12/17/23. Electronically Reviewed by:  Toribio Childs, MD, [facility] Electronically Reviewed on:  12/17/2023 10:21 AM I have reviewed the images and concur with the above findings. Electronically Signed by:  Morene Fishman, MD, [facility] Electronically Signed on:  12/17/2023 10:57 AM  CT brain without contrast Result Date: 12/16/2023 CT BRAIN WITHOUT CONTRAST INDICATION: Mental status change, unknown cause, R41.82 Altered mental status, unspecified COMPARISON: None. TECHNIQUE: Standard noncontrast brain CT. FINDINGS: Brain Parenchyma: There is no hemorrhage, cerebral  edema, acute cortical infarction, mass, mass effect, or midline shift. Scattered periventricular and subcortical white matter hypodensities without mass effect likely reflect sequelae of chronic small vessel ischemic disease. Moderate generalized volume loss. Ventricles and Sulci: Prominent lateral ventricles of asymmetrically enlarged right lateral ventricle, likely secondary to central volume loss. Extra-Axial Spaces: No extra-axial fluid collection. Basal Cisterns: Normal. Paranasal Sinuses: Mild right sphenoid sinus mucosal thickening and air fluid level. Mastoids: Normal mastoid. Soft tissue in the bilateral external auditory canals. Orbits: Bilateral lens replacements. Cranium and Bones: Normal.  Soft Tissues: Normal. IMPRESSION: 1.  No acute intracranial abnormalities. 2.  Right sphenoid sinus air fluid level, can be seen with acute sinusitis. 3.  Prominent soft tissue density in the bilateral external auditory canals, likely cerumen. Correlate with direct visual inspection. Electronically Reviewed by:  Sandrea Helm, MD, [facility] Electronically Reviewed on:  12/16/2023 3:22 PM I have reviewed the images and concur with the above findings. Electronically Signed by:  Gustav Bales, MD, [facility] Electronically Signed on:  12/16/2023 4:38 PM  X-ray chest PA and lateral Result Date: 12/16/2023 XR CHEST PA AND LATERAL INDICATION: Lung Aeration, R41.82 Altered mental status, unspecified COMPARISON: None FINDINGS: 1.  Double-lumen right IJ catheter appears to terminate in the cavoatrial junction. 2.  Cardiomediastinal contours are enlarged and may be exaggerated secondary to low lung volumes and AP film. 3.  There are bilateral lower lobe opacities which could be secondary to atelectasis related to low lung volumes however cannot rule out infective process. 4.  There are also mild intersitial opacities reflecting a component of edema. IMPRESSION: 1.  Bilateral lower lobe opacities which may represent atelectasis versus infection. 2.  Interstitial opacities likely reflecting mild pulmonary edema. Electronically Reviewed by:  Mabel Layer, MD, [facility] Electronically Reviewed on:  12/16/2023 2:49 PM I have reviewed the images and concur with the above findings. Electronically Signed by:  Comer Shoe, MD, [facility] Electronically Signed on:  12/16/2023 3:03 PM  _____________________  Code Status: Full Code Goals of care were addressed daily, extensively with HCPOA as patient cannot make medical decisions   Status on Discharge:  Current activity: Bedfast (12/20/23 1940) Current mobility: Completely immobile (12/20/23 1940)  Activity Recommendation: activity as tolerated  Other  Discharge Instructions: Services setup at discharge: SNF PT/OT and SNF   Tubes/lines at discharge: Urinary catheter (Foley) and G tube   Diet: tube feeding (NOVASOURCE RENAL) liquid DIET NPO Do NOT hold tube feeding Effective Now  Wound Care Order Instructions     None       _____________________  Time spent on discharge process: 35 minutes    Doyal JAYSON Cola, MD  [facility]  01/06/2024   Hospital Contact Information:  Madie Persons [facility]) [facility]) [facility])  Pending tests:  Laboratory: [phone] Microbiology: [phone] Pathology: [phone] Radiology: [phone]  General questions: [phone] Pending tests: Laboratory: [phone] Microbiology: [phone] Pathology: [phone] Radiology: [phone]  General questions:  [phone] Pending tests:  Laboratory: [phone] Microbiology: [phone] Pathology: [phone] Radiology: [phone]  General questions:  [phone]

## 2023-12-22 DIAGNOSIS — D72829 Elevated white blood cell count, unspecified: Secondary | ICD-10-CM | POA: Diagnosis not present

## 2023-12-22 DIAGNOSIS — I10 Essential (primary) hypertension: Secondary | ICD-10-CM | POA: Diagnosis not present

## 2023-12-22 DIAGNOSIS — N3289 Other specified disorders of bladder: Secondary | ICD-10-CM | POA: Diagnosis not present

## 2023-12-22 DIAGNOSIS — Z87440 Personal history of urinary (tract) infections: Secondary | ICD-10-CM | POA: Diagnosis not present

## 2023-12-22 DIAGNOSIS — R319 Hematuria, unspecified: Secondary | ICD-10-CM | POA: Diagnosis not present

## 2023-12-22 DIAGNOSIS — R1310 Dysphagia, unspecified: Secondary | ICD-10-CM | POA: Diagnosis not present

## 2023-12-22 DIAGNOSIS — F05 Delirium due to known physiological condition: Secondary | ICD-10-CM | POA: Diagnosis not present

## 2023-12-22 DIAGNOSIS — G928 Other toxic encephalopathy: Secondary | ICD-10-CM | POA: Diagnosis not present

## 2023-12-22 DIAGNOSIS — N179 Acute kidney failure, unspecified: Secondary | ICD-10-CM | POA: Diagnosis not present

## 2023-12-22 DIAGNOSIS — D62 Acute posthemorrhagic anemia: Secondary | ICD-10-CM | POA: Diagnosis not present

## 2023-12-22 DIAGNOSIS — R4182 Altered mental status, unspecified: Secondary | ICD-10-CM | POA: Diagnosis not present

## 2023-12-22 DIAGNOSIS — K220 Achalasia of cardia: Secondary | ICD-10-CM | POA: Diagnosis not present

## 2023-12-22 DIAGNOSIS — N1330 Unspecified hydronephrosis: Secondary | ICD-10-CM | POA: Diagnosis not present

## 2023-12-22 DIAGNOSIS — G9340 Encephalopathy, unspecified: Secondary | ICD-10-CM | POA: Diagnosis not present

## 2023-12-22 DIAGNOSIS — J9602 Acute respiratory failure with hypercapnia: Secondary | ICD-10-CM | POA: Diagnosis not present

## 2023-12-23 DIAGNOSIS — I10 Essential (primary) hypertension: Secondary | ICD-10-CM | POA: Diagnosis not present

## 2023-12-23 DIAGNOSIS — K220 Achalasia of cardia: Secondary | ICD-10-CM | POA: Diagnosis not present

## 2023-12-23 DIAGNOSIS — R310 Gross hematuria: Secondary | ICD-10-CM | POA: Diagnosis not present

## 2023-12-23 DIAGNOSIS — Z992 Dependence on renal dialysis: Secondary | ICD-10-CM | POA: Diagnosis not present

## 2023-12-23 DIAGNOSIS — D62 Acute posthemorrhagic anemia: Secondary | ICD-10-CM | POA: Diagnosis not present

## 2023-12-23 DIAGNOSIS — N179 Acute kidney failure, unspecified: Secondary | ICD-10-CM | POA: Diagnosis not present

## 2023-12-23 DIAGNOSIS — R319 Hematuria, unspecified: Secondary | ICD-10-CM | POA: Diagnosis not present

## 2023-12-23 DIAGNOSIS — D631 Anemia in chronic kidney disease: Secondary | ICD-10-CM | POA: Diagnosis not present

## 2023-12-23 DIAGNOSIS — G928 Other toxic encephalopathy: Secondary | ICD-10-CM | POA: Diagnosis not present

## 2023-12-23 DIAGNOSIS — F05 Delirium due to known physiological condition: Secondary | ICD-10-CM | POA: Diagnosis not present

## 2023-12-23 DIAGNOSIS — R1310 Dysphagia, unspecified: Secondary | ICD-10-CM | POA: Diagnosis not present

## 2023-12-23 DIAGNOSIS — R4182 Altered mental status, unspecified: Secondary | ICD-10-CM | POA: Diagnosis not present

## 2023-12-23 DIAGNOSIS — C678 Malignant neoplasm of overlapping sites of bladder: Secondary | ICD-10-CM | POA: Diagnosis not present

## 2023-12-23 DIAGNOSIS — N323 Diverticulum of bladder: Secondary | ICD-10-CM | POA: Diagnosis not present

## 2023-12-23 DIAGNOSIS — J9602 Acute respiratory failure with hypercapnia: Secondary | ICD-10-CM | POA: Diagnosis not present

## 2023-12-23 DIAGNOSIS — Z87440 Personal history of urinary (tract) infections: Secondary | ICD-10-CM | POA: Diagnosis not present

## 2023-12-23 DIAGNOSIS — N2889 Other specified disorders of kidney and ureter: Secondary | ICD-10-CM | POA: Diagnosis not present

## 2023-12-23 DIAGNOSIS — N1330 Unspecified hydronephrosis: Secondary | ICD-10-CM | POA: Diagnosis not present

## 2023-12-24 DIAGNOSIS — G9341 Metabolic encephalopathy: Secondary | ICD-10-CM | POA: Diagnosis not present

## 2023-12-24 DIAGNOSIS — N179 Acute kidney failure, unspecified: Secondary | ICD-10-CM | POA: Diagnosis not present

## 2023-12-24 DIAGNOSIS — R310 Gross hematuria: Secondary | ICD-10-CM | POA: Diagnosis not present

## 2023-12-24 DIAGNOSIS — C679 Malignant neoplasm of bladder, unspecified: Secondary | ICD-10-CM | POA: Diagnosis not present

## 2023-12-24 DIAGNOSIS — R319 Hematuria, unspecified: Secondary | ICD-10-CM | POA: Diagnosis not present

## 2023-12-24 DIAGNOSIS — R1310 Dysphagia, unspecified: Secondary | ICD-10-CM | POA: Diagnosis not present

## 2023-12-24 DIAGNOSIS — D631 Anemia in chronic kidney disease: Secondary | ICD-10-CM | POA: Diagnosis not present

## 2023-12-24 DIAGNOSIS — F05 Delirium due to known physiological condition: Secondary | ICD-10-CM | POA: Diagnosis not present

## 2023-12-24 DIAGNOSIS — R489 Unspecified symbolic dysfunctions: Secondary | ICD-10-CM | POA: Diagnosis not present

## 2023-12-24 DIAGNOSIS — N1330 Unspecified hydronephrosis: Secondary | ICD-10-CM | POA: Diagnosis not present

## 2023-12-24 DIAGNOSIS — N323 Diverticulum of bladder: Secondary | ICD-10-CM | POA: Diagnosis not present

## 2023-12-24 DIAGNOSIS — R4182 Altered mental status, unspecified: Secondary | ICD-10-CM | POA: Diagnosis not present

## 2023-12-24 DIAGNOSIS — N186 End stage renal disease: Secondary | ICD-10-CM | POA: Diagnosis not present

## 2023-12-24 DIAGNOSIS — J9692 Respiratory failure, unspecified with hypercapnia: Secondary | ICD-10-CM | POA: Diagnosis not present

## 2023-12-24 DIAGNOSIS — Z992 Dependence on renal dialysis: Secondary | ICD-10-CM | POA: Diagnosis not present

## 2023-12-24 DIAGNOSIS — K220 Achalasia of cardia: Secondary | ICD-10-CM | POA: Diagnosis not present

## 2023-12-24 DIAGNOSIS — C678 Malignant neoplasm of overlapping sites of bladder: Secondary | ICD-10-CM | POA: Diagnosis not present

## 2023-12-24 DIAGNOSIS — D62 Acute posthemorrhagic anemia: Secondary | ICD-10-CM | POA: Diagnosis not present

## 2023-12-24 DIAGNOSIS — N2889 Other specified disorders of kidney and ureter: Secondary | ICD-10-CM | POA: Diagnosis not present

## 2023-12-25 DIAGNOSIS — G9341 Metabolic encephalopathy: Secondary | ICD-10-CM | POA: Diagnosis not present

## 2023-12-25 DIAGNOSIS — N139 Obstructive and reflux uropathy, unspecified: Secondary | ICD-10-CM | POA: Diagnosis not present

## 2023-12-25 DIAGNOSIS — N2889 Other specified disorders of kidney and ureter: Secondary | ICD-10-CM | POA: Diagnosis not present

## 2023-12-25 DIAGNOSIS — D62 Acute posthemorrhagic anemia: Secondary | ICD-10-CM | POA: Diagnosis not present

## 2023-12-25 DIAGNOSIS — J9692 Respiratory failure, unspecified with hypercapnia: Secondary | ICD-10-CM | POA: Diagnosis not present

## 2023-12-25 DIAGNOSIS — R319 Hematuria, unspecified: Secondary | ICD-10-CM | POA: Diagnosis not present

## 2023-12-25 DIAGNOSIS — K220 Achalasia of cardia: Secondary | ICD-10-CM | POA: Diagnosis not present

## 2023-12-25 DIAGNOSIS — N179 Acute kidney failure, unspecified: Secondary | ICD-10-CM | POA: Diagnosis not present

## 2023-12-25 DIAGNOSIS — R1310 Dysphagia, unspecified: Secondary | ICD-10-CM | POA: Diagnosis not present

## 2023-12-25 DIAGNOSIS — Z992 Dependence on renal dialysis: Secondary | ICD-10-CM | POA: Diagnosis not present

## 2023-12-25 DIAGNOSIS — N1330 Unspecified hydronephrosis: Secondary | ICD-10-CM | POA: Diagnosis not present

## 2023-12-25 DIAGNOSIS — N186 End stage renal disease: Secondary | ICD-10-CM | POA: Diagnosis not present

## 2023-12-25 DIAGNOSIS — R4182 Altered mental status, unspecified: Secondary | ICD-10-CM | POA: Diagnosis not present

## 2023-12-25 DIAGNOSIS — R310 Gross hematuria: Secondary | ICD-10-CM | POA: Diagnosis not present

## 2023-12-25 DIAGNOSIS — F05 Delirium due to known physiological condition: Secondary | ICD-10-CM | POA: Diagnosis not present

## 2023-12-26 DIAGNOSIS — D62 Acute posthemorrhagic anemia: Secondary | ICD-10-CM | POA: Diagnosis not present

## 2023-12-26 DIAGNOSIS — J9692 Respiratory failure, unspecified with hypercapnia: Secondary | ICD-10-CM | POA: Diagnosis not present

## 2023-12-26 DIAGNOSIS — R4182 Altered mental status, unspecified: Secondary | ICD-10-CM | POA: Diagnosis not present

## 2023-12-26 DIAGNOSIS — N139 Obstructive and reflux uropathy, unspecified: Secondary | ICD-10-CM | POA: Diagnosis not present

## 2023-12-26 DIAGNOSIS — N2889 Other specified disorders of kidney and ureter: Secondary | ICD-10-CM | POA: Diagnosis not present

## 2023-12-26 DIAGNOSIS — R310 Gross hematuria: Secondary | ICD-10-CM | POA: Diagnosis not present

## 2023-12-26 DIAGNOSIS — D72829 Elevated white blood cell count, unspecified: Secondary | ICD-10-CM | POA: Diagnosis not present

## 2023-12-26 DIAGNOSIS — N179 Acute kidney failure, unspecified: Secondary | ICD-10-CM | POA: Diagnosis not present

## 2023-12-26 DIAGNOSIS — D649 Anemia, unspecified: Secondary | ICD-10-CM | POA: Diagnosis not present

## 2023-12-26 DIAGNOSIS — Z992 Dependence on renal dialysis: Secondary | ICD-10-CM | POA: Diagnosis not present

## 2023-12-26 DIAGNOSIS — G928 Other toxic encephalopathy: Secondary | ICD-10-CM | POA: Diagnosis not present

## 2023-12-26 DIAGNOSIS — N1330 Unspecified hydronephrosis: Secondary | ICD-10-CM | POA: Diagnosis not present

## 2024-01-02 NOTE — Discharge Summary (Addendum)
 Discharge to Outside Facility: Case Manager  Robert Vega, Robert Vega  Birth Date:  [id number] Age:  83 y.o. Sex:  male Gender:  male  Admit Date/Time:  12/16/2023 12:21 PM  Rationale For Transfer: Change in Level of Care  Planning For Transfer    Discharge Date:  01/06/2024    Discharge Disposition:  Skilled nursing facility    Method of Transport: ambulance     Receiving Facility:          [facility]        Person Accepting:          Tammy         Report called to Charge or care RN at:  [phone]                 Accepting Physician:      facility provider         Referring Provider Pager #:       Provider Role Specialty Pager   * Mehedint, Doyal File, MD Attending Internal Medicine * [phone]          Records Sent Demographic information Discharge to Outside Facility form Discharge summary H&P Progress notes (may include op notes, procedure notes, therapy notes, consults)  Signed By: KATHERN SORREL  *Some images could not be shown.

## 2024-01-03 NOTE — Progress Notes (Signed)
 Infectious Diseases Consultation Note  Date of Admission: 12/16/2023 Service Requesting: Internal Medicine Vega Day: 17 with principal problem of Forniceal rupture  Identifying Info:  Robert Vega is 83 y.o. male with a PMHx including HTN, GERD, Dysphagia, Prior Cardiac Arrest (Nov 2023) who presents as an AMA transfer from Robert Vega to Robert Vega after a prolonged Vega course; ID is consulted for guidance on antimicrobial therapy in the setting of Forniceal rupture and urinomas seen on imaging.  Subjective  HPI/Interval Updates:  To very briefly summarize Robert Vega recent course, he presented to the OSH on 5/10 and was admitted with sepsis 2/2 aspiration pneumonia; he ultimately suffered respiratory arrest and required mechanical intervention in the ICU. This hospitalization had numerous complications including botox  injections to his LES and concern for achalasia on esophogram (PEG placement pursued and later complicated by dislodgement 2/2 agitation, requiring exploratory laparotomy for G-tube replacement), multiple infections, an AKI that progressed to renal failure for which dialysis was initiated, and persistent encephalopathy thought to be toxic-metabolic in nature. While admitted at the OSH he received antibiotic therapy including Unasyn , Azithromycin , Ceftriaxone , Cefepime , Metronidazole , Zosyn , and Vancomycin  (time courses very helpfully included in Robert Vega 7/21 H&P). He was initially planned to be discharged to Robert Vega, at which point his HCPOA Robert Vega) pursued AMA transfer to Robert Vega for further evaluation.  On arrival, he was afebrile and hemodynamically stable, though has been intermittently tachypneic. Blood cultures were obtained and he was admitted for further management; CT imaging subsequently showed hydroureteronephrosis c/b forniceal rupture and urinomas, as well as a lesion in the bladder. Urology was consulted (recommending PCN placement pending family's  goals), a foley was placed, and broad spectrum antimicrobials were initiated; ID was consulted for further recommendations. After initial evaluation, antibiotics were stopped but restarted 7/27 given concern for uptrending leukocytosis.  - Cystoscopy 7/29 with transurethral resection of bladder tumor, bilateral retrograde pyelography, and placement of bilateral ureteral stents with clot evacuation and fulguration. - Foley (3-way) removed 7/31  We are called back by the primary team because Robert Vega reportedly had rigors yesterday evening.  He has had no documented fever and no significant change in WBC.  Last dose of pip-tazo was yesterday morning.    Antimicrobials: Fever Curve and Antibiotics (Current Encounter) <redacted file path> Piperacillin -Tazobactam (7/27- current) Vancomycin  (7/22) Meropenem (7/22) Micafungin (7/22)  Unasyn  6/16-20 Azithromycin  x 1 Cefepime  6/30-7/3 Ceftriaxone  5/10-14 Metronidazole  5/10, 6/30-7/3 Zosyn  6/11, 5/26-31, 7/3-15 Vancomycin  6/30-7/5   Objective  Physical Exam: Vitals:   01/03/24 1301  BP: 116/67  Pulse:   Resp: 16  Temp: 36.6 C (97.8 F)   Temp (24hrs), Avg:36.7 C (98 F), Min:36.3 C (97.4 F), Max:37 C (98.6 F)   Peripheral IV 12/21/23 Right Upper arm (13) Peripheral IV 01/02/24 Left;Lower Forearm (1) Urethral Catheter (6)  General- Elderly male, interactive but confused, obeys simple commands. HEENT- Atraumatic, fair dentition. Neck appears supple. CV- RRR, no m/r/g appreciated. Pulmonary- Good air movement bilaterally Abdominal- G-tube in place with dressing c/d/I +BS, mildly distended, soft. No apparent abdominal tenderness or guarding. GU- External urinary drainage system in place, yellow urine Extremities- WWP, scant pedal edema appreciated. Neuro- Alert, interactive, confused  Microbiology <redacted file path>: Blood/Urine/Resp/Tissue Cx with Fungal and AFB:  Lab Results  Component Value Date   BLDCULT No  growth detected. 12/16/2023   BLDCULT No growth detected. 12/16/2023   Lab Results  Component Value Date   URCULT No growth 12/17/2023  No results found for: CULTRESPNo results found for: TISSUEC,No results  found for: AFBCX, No results found for: FUNGUSC   Culture Data w/ Susceptibilities (1 year) <redacted file path>  6/15 OSH urine cultures reviewed; E faecalis  Laboratory Studies <redacted file path>: Lab Results  Component Value Date   WBC 10.5 (H) 01/03/2024   HGB 8.0 (L) 01/03/2024   PLT 290 01/03/2024   Lab Results  Component Value Date   NA 134 (L) 01/03/2024   K 4.8 01/03/2024   CL 96 (L) 01/03/2024   CO2 28 01/03/2024   CO2 27 01/02/2024   BUN 20 01/03/2024   CREATININE 0.8 01/03/2024   CALCIUM  8.0 (L) 01/03/2024   ALKPHOS 89 01/03/2024   ALT 31 01/03/2024   AST 34 01/03/2024   GLUCOSE 85 01/03/2024   Estimated Creatinine Clearance: 65.8 mL/min (based on SCr of 0.8 mg/dL). - Adjusted Ideal Body Weight CrCl Range (Ideal BW, Actual BW): (68.8, 102.5) ml/min     EKG (QTc): Lab Results  Component Value Date   QTC 408 12/16/2023   Radiology <redacted file path>: Relevant data (if any) personally reviewed in [place] and interpreted. CT chest/abd/pelvis 8/7-decreased right perinephric collection (7.3 x 1.4 cm, was 10.2 x 3.4 cm), mild increase in left perinephric collection (11.4 x 3.8 cm, was 10.2 x 3.0 cm), resolution of bilateral hydronephrosis, stable nonocclusive clot in right IJ and SVC, patchy opacities RLL, possibly aspiration  Pathology <redacted file path>: Relevant data (if any) personally reviewed in [place].  Each new and available test documented above been reviewed at time of consult/follow-up. Assessment & Plan   Assessment:  Robert Vega is an 83 y.o. man with a PMHx including HTN, GERD, Dysphagia, Prior Cardiac Arrest (Nov 2023) who presents as an AMA transfer from Robert Vega to Robert Vega after a prolonged Vega course; ID is consulted for  guidance on antimicrobial therapy in the setting of Forniceal rupture and urinomas seen on imaging.  We are called back because of subjective rigors without objective correlate.  Imaging demonstrates resolution of hydronephrosis with the left urinoma a bit bigger and the right a bit smaller.  He is flagrantly delirious and quite frail.  I would favor continuing to monitor off antibiotics and not pursuing any drainage procedure at this time.  If he does spike an objective fever or otherwise decompensates we could certainly reevaluate.  I would note that his esophagus is quite patulous on imaging and his latest CT is certainly consistent with some aspiration pneumonitis; it wouldn't surprise me if he has problems with recurrent aspiration on top of everything else.    #Bilateral Forniceal Rupture with associated Urinomas #History of recent Aspiration/HAP, E faecalis UTI #Encephalopathy, likely 2/2 Toxic-Metabolic Derangements #Bladder Mass w/ associated Hydroureteronephrosis   Recommendations: -Recommend stopping antibiotics and monitoring off; if patient re-fevers after cessation of antibiotics, would reculture and re-assess   ID will sign off. Probable need for outpatient ID f/u: [email or web address] currently indicated Please page ID General Team 2 [phone]) with questions or change in clinical status.  Infectious Diseases is available in-house from 8A-5P. If urgent assistance is needed outside of these hours, please page the Infectious Diseases Team Pager assigned to the patient for assistance. The Infectious Diseases consult pagers are available after business hours for emergencies only.  Any IV antimicrobials listed above require regular monitoring for toxicity. Recommendations as documented above and interpretation of above test results discussed with primary service.   Attestation Statement:   I personally performed the service. (TP)  Selinda Camellia Chester, MD

## 2024-01-03 NOTE — Progress Notes (Signed)
 Urology Brief Progress Note  HPI: 20 M with newly diagnosed muscle-invasive urothelial carcinoma. S/p TURBT with bilateral stent placement on 7/29. Deemed a poor operative and systemic therapy candidate by urology and medical oncology unless makes improvements in physical, mental deficits.   8/8: Urology re-engaged given concern for rigors. WBC improving. Remains on antibiotics per ID and medicine. Foley draining. Cr normal at 0.8. Repeat CTCAP demonstrates slight increase/stable size of left urinoma.     Assessment:  73 M with newly diagnosed muscle-invasive urothelial carcinoma. S/p TURBT with bilateral stent placement on 7/29. Deemed a poor operative and systemic therapy candidate by urology and medical oncology unless makes improvements in physical, mental deficits.   The urinoma is large but he is overall clinically improving. Can consider perc drainage with CT body vs IR if within family and patient's GOC. If this occurs, please send for urine culture and Cr.  Follow-up: Scheduled on 02/13/2024 with Dr. Zora MASON ENSURE HIS FOLEY CATHETER IS EXCHANGE EVERY 4 WEEKS. OKAY TO BE DONE BY NURSING IN HIS DISCHARGING FACILITY. IF DESIRES TO BE REPLACED BY UROLOGY, PLEASE PAGE UROLOGY TO COORDINATE.  Please re-engage Urology @ [phone] with questions or concerns.  Phil Falconer, MD Urology, PGY-4

## 2024-01-06 NOTE — Progress Notes (Addendum)
 Case Manager Discharge Summary / Closing Note  Expected Discharge Date & Time: 01/06/2024 at 11 am to 2 pm  Discharge Plan:       Post-Acute Services Coordinated: Destination - Admitted Since 12/16/2023     Service Provider Services Address Phone Fax Patient Preferred   [facility] [address] [place] [address] [phone] --      Disposition: Discharge Disposition Resource: Skilled nursing facility Information Discussed Information Discussed: Differences in Levels of Care, Placement will be sought in the community or geographic area that meets patient needs, When the patient is medically stable for discharge, placement will be offered at a safe, appropriate and available facility, Family role in the placement process Recommendation for placement was discussed with the following individual(s), and they verbalized agreement with post-discharge plans: [name] Facility Preferences The patient/family/other prefer the following facilities: [facility] Information Provided: This agency has Epic / Medlink access and has agreed to retrieve the order and necessary supporting documentation independently. Skilled Nursing Facility Agency Name: [facility] Contact Name: Tammy Accepting Physician: facility provider Phone/Fax: [phone] Pt/Family Preference: Preference (see progress notes and resource information) Quality Data: Quality Data accepted    Funding for Discharge Medications: Drug assistance not needed    Transportation: Arrangements: hospital coordinated Service Arranged: ambulance Phone/Fax: [phone] Vendor: [facility] Transportation (ground or Sales executive) arranged by Case Manager: Patient/Family informed of potential for financial liability. Instructed to follow up with ambulance company with questions.  Final ADT:  Final ADT Discharge Disposition: Facility/Hospital  Based        Facility/Hospital Based: Skilled Nursing Facility  Skilled Nursing Facility: Medicare/ [facility]        Second IMM Received: Provided to patient - in person (01/06/24 1002)  Final Summary:   Robert Vega

## 2024-01-06 NOTE — Care Plan (Signed)
 Pt discharged to SNF at 1600 via [facility]. Vitals and report given to [facility] worker. Pt belongings secured with patient. Report called and given to facility prior to discharge  Problem: Alteration in Hemodynamic Stability Goal: Ability to maintain absence of bleeding will improve Outcome: Met/ Completed   Problem: Fluid Volume Imbalance Goal: Impaired intake and output balance will improve Outcome: Met/ Completed   Problem: Risk for infection Goal: Patient will remain free from infection Outcome: Met/ Completed   Problem: Electrolyte Imbalance Goal: Electrolyte Imbalance will improve by discharge Outcome: Met/ Completed   Problem: All patients positive for delirium Goal: Patient will remain safe Outcome: Met/ Completed   Problem: All patients at High risk for falls Goal: Patient will remain free of falls. Outcome: Met/ Completed   Problem: Skin Integrity Impairment for Adult populations Goal: Activity Description: All Adult patients at medium/high risk based on Braden risk assessment subscale will not develop a pressure injury. Implement prevention interventions based on risk assessment sub-scale scores: Outcome: Met/ Completed   Problem: Patients that are at an increased risk for delirium. Goal: Sleep, rest, and comfort: The patient will be comfortable and rested. Outcome: Met/ Completed

## 2024-01-08 NOTE — Progress Notes (Signed)
 [facility] Care RN received a call from this patient's HPOA for the following: she had issues with his lab orders.  Chart reviewed and patient at [facility] 12/16/23-01/06/24.  He was discharged to [facility].  The d/c summary orders CBC, CMP, Mg to be done every 5 days x 3.  She stated she discussed doing it 2 x week.  I explained this was the MD recommendations and orders. Any new orders has to come from the Medical Director at the facility.  She should discuss her concerns with them.  She verbalized understanding.   Robert Washington  RN [facility] Care Transitions

## 2024-01-09 ENCOUNTER — Emergency Department

## 2024-01-09 ENCOUNTER — Other Ambulatory Visit: Payer: Self-pay

## 2024-01-09 ENCOUNTER — Inpatient Hospital Stay
Admission: EM | Admit: 2024-01-09 | Discharge: 2024-01-10 | DRG: 871 | Disposition: A | Discharge status: Other Acute Inpatient Hospital | Source: Skilled Nursing Facility | Attending: Pulmonary Disease | Admitting: Pulmonary Disease

## 2024-01-09 DIAGNOSIS — I5033 Acute on chronic diastolic (congestive) heart failure: Secondary | ICD-10-CM | POA: Diagnosis present

## 2024-01-09 DIAGNOSIS — K6819 Other retroperitoneal abscess: Secondary | ICD-10-CM

## 2024-01-09 DIAGNOSIS — C675 Malignant neoplasm of bladder neck: Secondary | ICD-10-CM | POA: Diagnosis present

## 2024-01-09 DIAGNOSIS — J9601 Acute respiratory failure with hypoxia: Secondary | ICD-10-CM | POA: Diagnosis present

## 2024-01-09 DIAGNOSIS — K220 Achalasia of cardia: Secondary | ICD-10-CM | POA: Diagnosis present

## 2024-01-09 DIAGNOSIS — C679 Malignant neoplasm of bladder, unspecified: Secondary | ICD-10-CM | POA: Diagnosis not present

## 2024-01-09 DIAGNOSIS — Z1621 Resistance to vancomycin: Secondary | ICD-10-CM | POA: Diagnosis present

## 2024-01-09 DIAGNOSIS — N1330 Unspecified hydronephrosis: Secondary | ICD-10-CM | POA: Diagnosis present

## 2024-01-09 DIAGNOSIS — N186 End stage renal disease: Secondary | ICD-10-CM | POA: Diagnosis present

## 2024-01-09 DIAGNOSIS — E875 Hyperkalemia: Secondary | ICD-10-CM | POA: Diagnosis present

## 2024-01-09 DIAGNOSIS — I132 Hypertensive heart and chronic kidney disease with heart failure and with stage 5 chronic kidney disease, or end stage renal disease: Secondary | ICD-10-CM | POA: Diagnosis present

## 2024-01-09 DIAGNOSIS — R7881 Bacteremia: Secondary | ICD-10-CM | POA: Diagnosis not present

## 2024-01-09 DIAGNOSIS — R339 Retention of urine, unspecified: Secondary | ICD-10-CM | POA: Diagnosis not present

## 2024-01-09 DIAGNOSIS — N400 Enlarged prostate without lower urinary tract symptoms: Secondary | ICD-10-CM | POA: Diagnosis present

## 2024-01-09 DIAGNOSIS — E8809 Other disorders of plasma-protein metabolism, not elsewhere classified: Secondary | ICD-10-CM | POA: Diagnosis present

## 2024-01-09 DIAGNOSIS — N179 Acute kidney failure, unspecified: Secondary | ICD-10-CM | POA: Diagnosis present

## 2024-01-09 DIAGNOSIS — I700 Atherosclerosis of aorta: Secondary | ICD-10-CM | POA: Diagnosis present

## 2024-01-09 DIAGNOSIS — Z5329 Procedure and treatment not carried out because of patient's decision for other reasons: Secondary | ICD-10-CM | POA: Diagnosis not present

## 2024-01-09 DIAGNOSIS — Z87891 Personal history of nicotine dependence: Secondary | ICD-10-CM

## 2024-01-09 DIAGNOSIS — Z860100 Personal history of colon polyps, unspecified: Secondary | ICD-10-CM

## 2024-01-09 DIAGNOSIS — A4181 Sepsis due to Enterococcus: Secondary | ICD-10-CM | POA: Diagnosis present

## 2024-01-09 DIAGNOSIS — R6521 Severe sepsis with septic shock: Secondary | ICD-10-CM | POA: Diagnosis present

## 2024-01-09 DIAGNOSIS — G9341 Metabolic encephalopathy: Secondary | ICD-10-CM | POA: Diagnosis present

## 2024-01-09 DIAGNOSIS — A419 Sepsis, unspecified organism: Principal | ICD-10-CM | POA: Diagnosis present

## 2024-01-09 DIAGNOSIS — K219 Gastro-esophageal reflux disease without esophagitis: Secondary | ICD-10-CM | POA: Diagnosis present

## 2024-01-09 DIAGNOSIS — Z8551 Personal history of malignant neoplasm of bladder: Secondary | ICD-10-CM

## 2024-01-09 DIAGNOSIS — B952 Enterococcus as the cause of diseases classified elsewhere: Secondary | ICD-10-CM | POA: Diagnosis not present

## 2024-01-09 DIAGNOSIS — K8020 Calculus of gallbladder without cholecystitis without obstruction: Secondary | ICD-10-CM | POA: Diagnosis present

## 2024-01-09 DIAGNOSIS — G4733 Obstructive sleep apnea (adult) (pediatric): Secondary | ICD-10-CM | POA: Diagnosis present

## 2024-01-09 DIAGNOSIS — D638 Anemia in other chronic diseases classified elsewhere: Secondary | ICD-10-CM | POA: Diagnosis not present

## 2024-01-09 DIAGNOSIS — E46 Unspecified protein-calorie malnutrition: Secondary | ICD-10-CM | POA: Diagnosis present

## 2024-01-09 DIAGNOSIS — E669 Obesity, unspecified: Secondary | ICD-10-CM | POA: Diagnosis present

## 2024-01-09 DIAGNOSIS — Z431 Encounter for attention to gastrostomy: Secondary | ICD-10-CM

## 2024-01-09 DIAGNOSIS — K869 Disease of pancreas, unspecified: Secondary | ICD-10-CM | POA: Diagnosis present

## 2024-01-09 DIAGNOSIS — Y738 Miscellaneous gastroenterology and urology devices associated with adverse incidents, not elsewhere classified: Secondary | ICD-10-CM | POA: Diagnosis present

## 2024-01-09 DIAGNOSIS — D72829 Elevated white blood cell count, unspecified: Secondary | ICD-10-CM | POA: Diagnosis not present

## 2024-01-09 DIAGNOSIS — Z6831 Body mass index (BMI) 31.0-31.9, adult: Secondary | ICD-10-CM | POA: Diagnosis not present

## 2024-01-09 DIAGNOSIS — Z7982 Long term (current) use of aspirin: Secondary | ICD-10-CM

## 2024-01-09 DIAGNOSIS — E878 Other disorders of electrolyte and fluid balance, not elsewhere classified: Secondary | ICD-10-CM | POA: Diagnosis present

## 2024-01-09 DIAGNOSIS — Z85828 Personal history of other malignant neoplasm of skin: Secondary | ICD-10-CM

## 2024-01-09 DIAGNOSIS — Z8249 Family history of ischemic heart disease and other diseases of the circulatory system: Secondary | ICD-10-CM

## 2024-01-09 DIAGNOSIS — D631 Anemia in chronic kidney disease: Secondary | ICD-10-CM | POA: Diagnosis present

## 2024-01-09 DIAGNOSIS — Z1152 Encounter for screening for COVID-19: Secondary | ICD-10-CM

## 2024-01-09 DIAGNOSIS — G9340 Encephalopathy, unspecified: Secondary | ICD-10-CM

## 2024-01-09 DIAGNOSIS — Z79899 Other long term (current) drug therapy: Secondary | ICD-10-CM

## 2024-01-09 DIAGNOSIS — T83021A Displacement of indwelling urethral catheter, initial encounter: Secondary | ICD-10-CM | POA: Diagnosis present

## 2024-01-09 LAB — BLOOD CULTURE ID PANEL (REFLEXED) - BCID2
A.calcoaceticus-baumannii: NOT DETECTED
Bacteroides fragilis: NOT DETECTED
Candida albicans: NOT DETECTED
Candida auris: NOT DETECTED
Candida glabrata: NOT DETECTED
Candida krusei: NOT DETECTED
Candida parapsilosis: NOT DETECTED
Candida tropicalis: NOT DETECTED
Cryptococcus neoformans/gattii: NOT DETECTED
Enterobacter cloacae complex: NOT DETECTED
Enterobacterales: NOT DETECTED
Enterococcus Faecium: DETECTED — AB
Enterococcus faecalis: NOT DETECTED
Escherichia coli: NOT DETECTED
Haemophilus influenzae: NOT DETECTED
Klebsiella aerogenes: NOT DETECTED
Klebsiella oxytoca: NOT DETECTED
Klebsiella pneumoniae: NOT DETECTED
Listeria monocytogenes: NOT DETECTED
Neisseria meningitidis: NOT DETECTED
Proteus species: NOT DETECTED
Pseudomonas aeruginosa: NOT DETECTED
Salmonella species: NOT DETECTED
Serratia marcescens: NOT DETECTED
Staphylococcus aureus (BCID): NOT DETECTED
Staphylococcus epidermidis: NOT DETECTED
Staphylococcus lugdunensis: NOT DETECTED
Staphylococcus species: NOT DETECTED
Stenotrophomonas maltophilia: NOT DETECTED
Streptococcus agalactiae: NOT DETECTED
Streptococcus pneumoniae: NOT DETECTED
Streptococcus pyogenes: NOT DETECTED
Streptococcus species: NOT DETECTED
Vancomycin resistance: DETECTED — AB

## 2024-01-09 LAB — CBC WITH DIFFERENTIAL/PLATELET
Abs Immature Granulocytes: 0.32 K/uL — ABNORMAL HIGH (ref 0.00–0.07)
Basophils Absolute: 0.1 K/uL (ref 0.0–0.1)
Basophils Relative: 0 %
Eosinophils Absolute: 0 K/uL (ref 0.0–0.5)
Eosinophils Relative: 0 %
HCT: 30.3 % — ABNORMAL LOW (ref 39.0–52.0)
Hemoglobin: 9.5 g/dL — ABNORMAL LOW (ref 13.0–17.0)
Immature Granulocytes: 1 %
Lymphocytes Relative: 2 %
Lymphs Abs: 0.5 K/uL — ABNORMAL LOW (ref 0.7–4.0)
MCH: 28.3 pg (ref 26.0–34.0)
MCHC: 31.4 g/dL (ref 30.0–36.0)
MCV: 90.2 fL (ref 80.0–100.0)
Monocytes Absolute: 2 K/uL — ABNORMAL HIGH (ref 0.1–1.0)
Monocytes Relative: 8 %
Neutro Abs: 20.7 K/uL — ABNORMAL HIGH (ref 1.7–7.7)
Neutrophils Relative %: 89 %
Platelets: 473 K/uL — ABNORMAL HIGH (ref 150–400)
RBC: 3.36 MIL/uL — ABNORMAL LOW (ref 4.22–5.81)
RDW: 15.2 % (ref 11.5–15.5)
WBC: 23.6 K/uL — ABNORMAL HIGH (ref 4.0–10.5)
nRBC: 0 % (ref 0.0–0.2)

## 2024-01-09 LAB — URINALYSIS, ROUTINE W REFLEX MICROSCOPIC
Bilirubin Urine: NEGATIVE
Glucose, UA: NEGATIVE mg/dL
Ketones, ur: NEGATIVE mg/dL
Nitrite: NEGATIVE
Protein, ur: 100 mg/dL — AB
RBC / HPF: >50 RBC/hpf (ref 0–5)
Specific Gravity, Urine: 1.023 (ref 1.005–1.030)
Squamous Epithelial / HPF: 0 /HPF (ref 0–5)
WBC, UA: >50 WBC/hpf (ref 0–5)
pH: 6 (ref 5.0–8.0)

## 2024-01-09 LAB — BLOOD GAS, VENOUS
Acid-Base Excess: 5.9 mmol/L — ABNORMAL HIGH (ref 0.0–2.0)
Acid-Base Excess: 5.7 mmol/L — ABNORMAL HIGH (ref 0.0–2.0)
Bicarbonate: 32.9 mmol/L — ABNORMAL HIGH (ref 20.0–28.0)
Bicarbonate: 33 mmol/L — ABNORMAL HIGH (ref 20.0–28.0)
O2 Saturation: 40.9 %
O2 Saturation: 89.9 %
Patient temperature: 37
Patient temperature: 37
pCO2, Ven: 61 mmHg — ABNORMAL HIGH (ref 44–60)
pCO2, Ven: 64 mmHg — ABNORMAL HIGH (ref 44–60)
pH, Ven: 7.34 (ref 7.25–7.43)
pH, Ven: 7.32 (ref 7.25–7.43)
pO2, Ven: 32 mmHg (ref 32–45)
pO2, Ven: 55 mmHg — ABNORMAL HIGH (ref 32–45)

## 2024-01-09 LAB — COMPREHENSIVE METABOLIC PANEL WITH GFR
ALT: 28 U/L (ref 0–44)
AST: 31 U/L (ref 15–41)
Albumin: 2.1 g/dL — ABNORMAL LOW (ref 3.5–5.0)
Alkaline Phosphatase: 86 U/L (ref 38–126)
Anion gap: 13 (ref 5–15)
BUN: 41 mg/dL — ABNORMAL HIGH (ref 8–23)
CO2: 28 mmol/L (ref 22–32)
Calcium: 8.4 mg/dL — ABNORMAL LOW (ref 8.9–10.3)
Chloride: 97 mmol/L — ABNORMAL LOW (ref 98–111)
Creatinine, Ser: 1.46 mg/dL — ABNORMAL HIGH (ref 0.61–1.24)
GFR, Estimated: 47 mL/min — ABNORMAL LOW (ref >60)
Glucose, Bld: 111 mg/dL — ABNORMAL HIGH (ref 70–99)
Potassium: 4.4 mmol/L (ref 3.5–5.1)
Sodium: 138 mmol/L (ref 135–145)
Total Bilirubin: 0.3 mg/dL (ref 0.0–1.2)
Total Protein: 8.1 g/dL (ref 6.5–8.1)

## 2024-01-09 LAB — RESP PANEL BY RT-PCR (RSV, FLU A&B, COVID)  RVPGX2
Influenza A by PCR: NEGATIVE
Influenza B by PCR: NEGATIVE
Resp Syncytial Virus by PCR: NEGATIVE
SARS Coronavirus 2 by RT PCR: NEGATIVE

## 2024-01-09 LAB — LACTIC ACID, PLASMA: Lactic Acid, Venous: 1.6 mmol/L (ref 0.5–1.9)

## 2024-01-09 LAB — BRAIN NATRIURETIC PEPTIDE: B Natriuretic Peptide: 223.2 pg/mL — ABNORMAL HIGH (ref 0.0–100.0)

## 2024-01-09 LAB — TROPONIN I (HIGH SENSITIVITY)
Troponin I (High Sensitivity): 17 ng/L (ref <18)
Troponin I (High Sensitivity): 19 ng/L — ABNORMAL HIGH (ref <18)

## 2024-01-09 LAB — PROCALCITONIN: Procalcitonin: 3.8 ng/mL

## 2024-01-09 MED ORDER — PIPERACILLIN-TAZOBACTAM 3.375 G IVPB 30 MIN
3.3750 g | Freq: Once | INTRAVENOUS | Status: AC
  Administered 2024-01-09: 3.375 g via INTRAVENOUS
  Filled 2024-01-09 (×2): qty 50

## 2024-01-09 MED ORDER — NOREPINEPHRINE 4 MG/250ML-% IV SOLN
0.0000 ug/min | INTRAVENOUS | Status: DC
  Administered 2024-01-09: 2 ug/min via INTRAVENOUS
  Administered 2024-01-09: 5 ug/min via INTRAVENOUS
  Filled 2024-01-09 (×2): qty 250

## 2024-01-09 MED ORDER — LACTATED RINGERS IV BOLUS
1000.0000 mL | Freq: Once | INTRAVENOUS | Status: AC
  Administered 2024-01-09: 1000 mL via INTRAVENOUS

## 2024-01-09 MED ORDER — ENOXAPARIN SODIUM 60 MG/0.6ML IJ SOSY
0.5000 mg/kg | PREFILLED_SYRINGE | INTRAMUSCULAR | Status: DC
  Administered 2024-01-10: 47.5 mg via SUBCUTANEOUS
  Filled 2024-01-09: qty 0.6

## 2024-01-09 MED ORDER — SODIUM CHLORIDE 0.9 % IV BOLUS
1000.0000 mL | Freq: Once | INTRAVENOUS | Status: AC
  Administered 2024-01-09: 1000 mL via INTRAVENOUS

## 2024-01-09 MED ORDER — POLYETHYLENE GLYCOL 3350 17 G PO PACK: 17.0000 g | PACK | Freq: Every day | ORAL | Status: DC | PRN

## 2024-01-09 MED ORDER — IOHEXOL 350 MG/ML SOLN
100.0000 mL | Freq: Once | INTRAVENOUS | Status: AC | PRN
  Administered 2024-01-09: 100 mL via INTRAVENOUS

## 2024-01-09 MED ORDER — VANCOMYCIN HCL IN DEXTROSE 1-5 GM/200ML-% IV SOLN
1000.0000 mg | Freq: Once | INTRAVENOUS | Status: AC
  Administered 2024-01-09: 1000 mg via INTRAVENOUS
  Filled 2024-01-09: qty 200

## 2024-01-09 MED ORDER — SODIUM CHLORIDE 0.9 % IV SOLN: 1.0000 g | Freq: Two times a day (BID) | INTRAVENOUS | Status: DC

## 2024-01-09 MED ORDER — ACETAMINOPHEN 325 MG RE SUPP
650.0000 mg | Freq: Once | RECTAL | Status: AC
  Administered 2024-01-09: 650 mg via RECTAL
  Filled 2024-01-09: qty 2

## 2024-01-09 MED ORDER — METRONIDAZOLE 500 MG/100ML IV SOLN
500.0000 mg | Freq: Once | INTRAVENOUS | Status: AC
  Administered 2024-01-09: 500 mg via INTRAVENOUS
  Filled 2024-01-09: qty 100

## 2024-01-09 MED ORDER — ALBUMIN HUMAN 25 % IV SOLN
25.0000 g | Freq: Once | INTRAVENOUS | Status: AC
  Administered 2024-01-10: 25 g via INTRAVENOUS
  Filled 2024-01-09: qty 100

## 2024-01-09 MED ORDER — CHLORHEXIDINE GLUCONATE CLOTH 2 % EX PADS
6.0000 | MEDICATED_PAD | Freq: Every day | CUTANEOUS | Status: DC
  Filled 2024-01-09: qty 6

## 2024-01-09 MED ORDER — DOCUSATE SODIUM 100 MG PO CAPS: 100.0000 mg | ORAL_CAPSULE | Freq: Two times a day (BID) | ORAL | Status: DC | PRN

## 2024-01-09 MED ORDER — SODIUM CHLORIDE 0.9 % IV SOLN
2.0000 g | Freq: Once | INTRAVENOUS | Status: AC
  Administered 2024-01-09: 2 g via INTRAVENOUS
  Filled 2024-01-09: qty 12.5

## 2024-01-09 MED ORDER — LIDOCAINE HCL (CARDIAC) PF 100 MG/5ML IV SOSY
PREFILLED_SYRINGE | INTRAVENOUS | Status: AC
  Filled 2024-01-09: qty 5

## 2024-01-09 MED ORDER — LINEZOLID 600 MG/300ML IV SOLN
600.0000 mg | Freq: Two times a day (BID) | INTRAVENOUS | Status: DC
  Administered 2024-01-10 (×2): 600 mg via INTRAVENOUS
  Filled 2024-01-09 (×2): qty 300

## 2024-01-09 NOTE — Progress Notes (Signed)
 Pharmacy Antibiotic Note  Robert Vega is a 83 y.o. male admitted on 01/09/2024 with sepsis and urinoma.  Pharmacy has been consulted for Meropenem dosing.  Pt was on meropenem, Vancomycin  and Micafungin at [facility].   Plan: Meropenem 1 gm IV Q12H ordered to start on 8/14 @ 2300.   Height: 5' 9 (175.3 cm) Weight: 96.5 kg (212 lb 11.2 oz) IBW/kg (Calculated) : 70.7  Temp (24hrs), Avg:100.8 F (38.2 C), Min:98.3 F (36.8 C), Max:103.2 F (39.6 C)  Recent Labs  Lab 01/09/24 1022 01/09/24 1025  WBC 23.6*  --   CREATININE 1.46*  --   LATICACIDVEN  --  1.6    Estimated Creatinine Clearance: 43.2 mL/min (A) (by C-G formula based on SCr of 1.46 mg/dL (H)).    Allergies  Allergen Reactions   Sulfonamide Derivatives     Antimicrobials this admission:   >>    >>   Dose adjustments this admission:   Microbiology results:  BCx:   UCx:    Sputum:    MRSA PCR:   Thank you for allowing pharmacy to be a part of this patient's care.  Cheyla Duchemin D 01/09/2024 11:06 PM

## 2024-01-09 NOTE — ED Notes (Signed)
 Jon RN with the assistance of this RN and Fredderick RN attempted unsuccessfully to insert a new foley catheter. A 22 fr 3-way catheter was attempted, a 24 fr coude 3-way was attempted and a 14 fr coude was attempted unsuccessfully. EDP Funke made aware who will reach out to urology.

## 2024-01-09 NOTE — ED Notes (Signed)
 Dr Willo came in the room to speak to family members of the patient regarding timing of transport. Response from cousin was about 2 or so hours according to [name] (Pt's son). Dr. Willo recommended Pt to be admitted to ICU in the meantime for the care of the patient given we don't know how long it will be before transported.  Family stated that they didn't think [name] would want him admitted. Dr. Willo asked again to get verification of how long transport would be because leaving the patient in the emergency room is not the best care for the patient.

## 2024-01-09 NOTE — ED Notes (Addendum)
 Spoke with Pt's visitors (1st cousin and wife who live near Pt).  They updated me with the plan regarding getting the Pt to [facility].  The granddaughter Bonney who is a Chartered certified accountant) is now working on Designer, industrial/product to [employer] since [employer] has declined transport due to capacity.  They stated that Pt will be going ER to ER.  They also stated Dr Willo was just in and recommended the PT to be admitted to our ICU and to be intubated.  [name] (over the phone) declined intubation because then private transport would not be able to take him to [facility].   They said they would let me know as they get updates.

## 2024-01-09 NOTE — Progress Notes (Addendum)
 PHARMACY - PHYSICIAN COMMUNICATION CRITICAL VALUE ALERT - BLOOD CULTURE IDENTIFICATION (BCID)  Robert Vega is an 83 y.o. male who presented to [facility] on 01/09/2024 with a chief complaint of sepsis shock, hx of urinoma;  pt was on meropenem , micafungin and vancomycin  recently at [facility].  Pt Urine Cx positive for  E Faecalis which was sensitive to Ampicillin .    Assessment:  Enterococcus Faecium growing in 3 of 4 bottles ,  Van A/B resistance detected.  (include suspected source if known)  Name of physician (or Provider) Contacted: Jenita Ruth Rust-Chester, NP   Current antibiotics: Meropenem 1 gm IV Q12H.   Changes to prescribed antibiotics recommended:  - will d/c meropenem and start Linezolid  600 mg IV Q12H on 8/14 @ 2345. - ID consult ordered   Results for orders placed or performed during the hospital encounter of 01/09/24  Blood Culture ID Panel (Reflexed) (Collected: 01/09/2024 10:59 AM)  Result Value Ref Range   Enterococcus faecalis NOT DETECTED NOT DETECTED   Enterococcus Faecium DETECTED (A) NOT DETECTED   Listeria monocytogenes NOT DETECTED NOT DETECTED   Staphylococcus species NOT DETECTED NOT DETECTED   Staphylococcus aureus (BCID) NOT DETECTED NOT DETECTED   Staphylococcus epidermidis NOT DETECTED NOT DETECTED   Staphylococcus lugdunensis NOT DETECTED NOT DETECTED   Streptococcus species NOT DETECTED NOT DETECTED   Streptococcus agalactiae NOT DETECTED NOT DETECTED   Streptococcus pneumoniae NOT DETECTED NOT DETECTED   Streptococcus pyogenes NOT DETECTED NOT DETECTED   A.calcoaceticus-baumannii NOT DETECTED NOT DETECTED   Bacteroides fragilis NOT DETECTED NOT DETECTED   Enterobacterales NOT DETECTED NOT DETECTED   Enterobacter cloacae complex NOT DETECTED NOT DETECTED   Escherichia coli NOT DETECTED NOT DETECTED   Klebsiella aerogenes NOT DETECTED NOT DETECTED   Klebsiella oxytoca NOT DETECTED NOT DETECTED   Klebsiella pneumoniae NOT DETECTED NOT DETECTED    Proteus species NOT DETECTED NOT DETECTED   Salmonella species NOT DETECTED NOT DETECTED   Serratia marcescens NOT DETECTED NOT DETECTED   Haemophilus influenzae NOT DETECTED NOT DETECTED   Neisseria meningitidis NOT DETECTED NOT DETECTED   Pseudomonas aeruginosa NOT DETECTED NOT DETECTED   Stenotrophomonas maltophilia NOT DETECTED NOT DETECTED   Candida albicans NOT DETECTED NOT DETECTED   Candida auris NOT DETECTED NOT DETECTED   Candida glabrata NOT DETECTED NOT DETECTED   Candida krusei NOT DETECTED NOT DETECTED   Candida parapsilosis NOT DETECTED NOT DETECTED   Candida tropicalis NOT DETECTED NOT DETECTED   Cryptococcus neoformans/gattii NOT DETECTED NOT DETECTED   Vancomycin  resistance DETECTED (A) NOT DETECTED    Clemence Stillings D 01/09/2024  11:24 PM

## 2024-01-09 NOTE — Progress Notes (Signed)
 CODE SEPSIS - PHARMACY COMMUNICATION  **Broad Spectrum Antibiotics should be administered within 1 hour of Sepsis diagnosis**  Time Code Sepsis Called/Page Received: 1028  Antibiotics Ordered: Vancomycin , Cefepime , Flagyl    Time of 1st antibiotic administration: 1032  Additional action taken by pharmacy: N/A  If necessary, Name of Provider/Nurse Contacted: N/A   Estill CHRISTELLA Lutes, PharmD, BCPS Clinical Pharmacist 01/09/2024 10:35 AM

## 2024-01-09 NOTE — ED Triage Notes (Signed)
 Pt arrives via [facility] from [facility] with c/o SOB that started this morning. EMS found Pt on 3L [place] @86 %, pt was put on 6L and came up to 96%, pt was diaphoretic when they arrived on scene. Per EMS, was told from daughter that pt is confused at baseline but is alert and will communicate with you. Pt was not alert on arrival to ED but is able to maintain their airway.

## 2024-01-09 NOTE — Progress Notes (Signed)
 Anticoagulation monitoring(Lovenox ):  83 yo male ordered Lovenox  40 mg Q24h    Filed Weights   01/09/24 1023  Weight: 96.5 kg (212 lb 11.2 oz)   BMI 31.4    Lab Results  Component Value Date   CREATININE 1.46 (H) 01/09/2024   CREATININE 5.82 (H) 12/16/2023   CREATININE 6.36 (H) 12/14/2023   Estimated Creatinine Clearance: 43.2 mL/min (A) (by C-G formula based on SCr of 1.46 mg/dL (H)). Hemoglobin & Hematocrit     Component Value Date/Time   HGB 9.5 (L) 01/09/2024 1022   HCT 30.3 (L) 01/09/2024 1022     Per Protocol for Patient with estCrcl > 30 ml/min and BMI > 30, will transition to Lovenox  47.5 mg Q24h.

## 2024-01-09 NOTE — ED Notes (Signed)
 EDP Funke at bedside, speaking with pt's POA on the phone.

## 2024-01-09 NOTE — Progress Notes (Signed)
 Asked by EDP to evaluate patient for ICU admission.  Patient presented with main complaint of shortness of breath. Upon arrival to ED patient altered & septic.  Family would like patient transferred to [facility] where all his specialists are located. [facility] declined due to capacity. Family has arranged transport to [facility] and plans to leave AMA to take private transport to [facility] ED for care.   While awaiting transport, patient became hypotensive and obtunded. Intubation was recommended to protect his airway but was declined by family to not disrupt transport service capabilities. Asked to evaluate for admission while awaiting transport.  Upon bedside assessment, patient is obtunded, RASS: -4, not protecting his airway on 2 L [place]. Vitals are stable on vasopressor support at 8 mcg. HCPOA confirmed patient is a FULL code. I also recommended intubation and mechanical vent support, HCPOA confirmed patient is a FULL code but family would want to hold off on intubation and mechanical ventilator support unless the patient is unable to oxygenate/ventilate and becomes unstable.  Family also confirmed that transport will be arriving in the next few hours. At this time patient had stable vitals awaiting impending transfer to [facility] ED. Will hold off on admission at this time. If transport is delayed or the patient becomes unstable requiring intubation please re-consult and PCCM will admit. Family updated on decision and will alert ED staff if transport is delayed for any reason     Time spent: 30 minutes  Jenita Jama Meek, AGACNP-BC Acute Care Nurse Practitioner  Pulmonary & Critical Care    [phone] / [phone] Please see Amion for pager details.

## 2024-01-09 NOTE — Sepsis Progress Note (Signed)
 Elink monitoring for the code sepsis protocol.

## 2024-01-09 NOTE — Consult Note (Signed)
 Urology was asked for assistance with difficult Foley catheter placement.  Patient is admitted with sepsis and misplaced Foley catheter, s/p Foley removal.  He has failed multiple Foley attempts by nursing and ED MD.  Simple Catheter Placement  Due to urinary retention patient is present today for a foley cath placement.  Patient was cleaned and prepped in a sterile fashion with betadine and two tubes of 2% lidocaine  jelly was instilled into the urethra. A 20 FR coude foley catheter was inserted, urine return was noted  , urine was amber in color.  The balloon was filled with 10cc of sterile water .  A night bag was attached for drainage. Patient tolerated well, no complications were noted.  Performed by: Mckenzye Cutright, PA-C   Additional notes: Catheter insertion was rather challenging, but ultimately successful.  There was no significant gross hematuria with Foley placement, no indication for CBI at this time.  Please contact urology with any further needs.

## 2024-01-09 NOTE — Sepsis Progress Note (Signed)
 Notified provider and bedside nurse of need to order and administer fluid bolus, pt needas 2895 cc per protocol.

## 2024-01-09 NOTE — ED Provider Notes (Signed)
 [facility] Provider Note    Event Date/Time   First MD Initiated Contact with Patient 01/09/24 1019     (approximate)   History   Shortness of Breath   HPI  Robert Vega is a 83 y.o. 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  For patient's bladder cancer it sounds that patient is not a candidate for chemotherapy, radiation they had recommended discussion of palliative versus hospice but the healthcare power of attorney had declined.  Patient comes in from [facility] due to shortness of breath and altered mental status since this morning.  They think patient's baseline oxygen is 3 L and he was found to be 86% so placed on 6 L.  Patient is acting more confused than his baseline he is typically confused but typically able to communicate.   Physical Exam   Triage Vital Signs: ED Triage Vitals  Encounter Vitals Group     BP      Girls Systolic BP Percentile      Girls Diastolic BP Percentile      Boys Systolic BP Percentile      Boys Diastolic BP Percentile      Pulse      Resp      Temp      Temp src      SpO2      Weight      Height      Head Circumference      [facility]      Pain Score      Pain Loc      Pain Education      Exclude from Growth Chart     Most recent vital signs: Vitals:   01/09/24 1020 01/09/24 1022  BP: (!) 85/51   Pulse: (!) 109   Resp: (!) 30   Temp: (!) 103.2 F (39.6 C)   SpO2: 93% 97%     General: Patient is moaning but is not able to follow commands or participate in conversation. CV:  Good peripheral perfusion.  Tachycardic Resp:  Increased effort Abd:  No distention.  G-tube noted Other:  Patient has Foley with blood noted in the catheter. No  swelling of legs.   ED Results / Procedures / Treatments   Labs (all labs ordered are listed, but only abnormal results are displayed) Labs Reviewed  CBC WITH DIFFERENTIAL/PLATELET - Abnormal; Notable for the following components:      Result Value   WBC 23.6 (*)    RBC 3.36 (*)    Hemoglobin 9.5 (*)    HCT 30.3 (*)    Platelets 473 (*)    Neutro Abs 20.7 (*)    Lymphs Abs 0.5 (*)    Monocytes Absolute 2.0 (*)    Abs Immature Granulocytes 0.32 (*)    All other components within normal limits  COMPREHENSIVE METABOLIC PANEL WITH GFR - Abnormal; Notable for the following components:   Chloride 97 (*)    Glucose, Bld 111 (*)    BUN 41 (*)    Creatinine, Ser 1.46 (*)    Calcium  8.4 (*)    Albumin  2.1 (*)    GFR, Estimated 47 (*)    All other components within normal limits  BLOOD GAS, VENOUS -  Abnormal; Notable for the following components:   pCO2, Ven 61 (*)    Bicarbonate 32.9 (*)    Acid-Base Excess 5.9 (*)    All other components within normal limits  BRAIN NATRIURETIC PEPTIDE - Abnormal; Notable for the following components:   B Natriuretic Peptide 223.2 (*)    All other components within normal limits  TROPONIN I (HIGH SENSITIVITY) - Abnormal; Notable for the following components:   Troponin I (High Sensitivity) 19 (*)    All other components within normal limits  RESP PANEL BY RT-PCR (RSV, FLU A&B, COVID)  RVPGX2  CULTURE, BLOOD (ROUTINE X 2)  CULTURE, BLOOD (ROUTINE X 2)  URINE CULTURE  LACTIC ACID, PLASMA  PROCALCITONIN  URINALYSIS, ROUTINE W REFLEX MICROSCOPIC  TROPONIN I (HIGH SENSITIVITY)     EKG  My interpretation of EKG:  Sinus tachycardia rate of 110 without any ST elevation, T wave version lead III, normal intervals  RADIOLOGY I have reviewed the xray personally and interpreted possible edema    PROCEDURES:  Critical Care performed: Yes, see critical care procedure note(s)  .1-3 Lead EKG Interpretation  Performed by: Ernest Ronal BRAVO,  MD Authorized by: Ernest Ronal BRAVO, MD     Interpretation: abnormal     ECG rate:  120   ECG rate assessment: tachycardic     Rhythm: sinus tachycardia     Ectopy: none     Conduction: normal   .Critical Care  Performed by: Ernest Ronal BRAVO, MD Authorized by: Ernest Ronal BRAVO, MD   Critical care provider statement:    Critical care time (minutes):  75   Critical care was necessary to treat or prevent imminent or life-threatening deterioration of the following conditions:  Sepsis   Critical care was time spent personally by me on the following activities:  Development of treatment plan with patient or surrogate, discussions with consultants, evaluation of patient's response to treatment, examination of patient, ordering and review of laboratory studies, ordering and review of radiographic studies, ordering and performing treatments and interventions, pulse oximetry, re-evaluation of patient's condition and review of old charts    MEDICATIONS ORDERED IN ED: Medications  norepinephrine  (LEVOPHED ) 4mg  in (0.016 mg/mL) premix infusion (8 mcg/min Intravenous Rate/Dose Change 01/09/24 1105)  piperacillin -tazobactam (ZOSYN ) IVPB 3.375 g (has no administration in time range)  lactated ringers  bolus 1,000 mL (has no administration in time range)  lactated ringers  bolus 1,000 mL (has no administration in time range)  lidocaine  (cardiac) 100 mg/20mL (XYLOCAINE ) 100 MG/5ML injection 2% (has no administration in time range)  sodium chloride  0.9 % bolus 1,000 mL (0 mLs Intravenous Stopped 01/09/24 1056)  acetaminophen  (TYLENOL ) suppository 650 mg (650 mg Rectal Given 01/09/24 1044)  ceFEPIme  (MAXIPIME ) 2 g in sodium chloride  0.9 % 100 mL IVPB (0 g Intravenous Stopped 01/09/24 1056)  metroNIDAZOLE  (FLAGYL ) IVPB 500 mg (0 mg Intravenous Stopped 01/09/24 1205)  vancomycin  (VANCOCIN ) IVPB 1000 mg/200 mL premix (0 mg Intravenous Stopped 01/09/24 1347)  iohexol  (OMNIPAQUE ) 350 MG/ML injection 100 mL (100 mLs  Intravenous Contrast Given 01/09/24 1236)     IMPRESSION / MDM / ASSESSMENT AND PLAN / ED COURSE  I reviewed the triage vital signs and the nursing notes.   Patient's presentation is most consistent with acute presentation with potential threat to life or bodily function.   This concerning for sepsis patient unable to give us  any story will get CT imaging to evaluate for intracranial hemorrhage, pneumonia, aspiration, edema, abscess, diverticulitis, appendicitis or other acute pathology.  Given patient meets sepsis criteria patient was given 1 L of fluid, broad-spectrum antibiotics.  Given his poor mentation and VBG ordered evaluate for hypercapnia.  Do not want to jump to intubating patient as I think he would be at high risk for coding secondary to abnormal vital signs.  Will try to optimize patient first and see if that helps improve mental status.  Venous blood gas does not show any evidence of hypercapnia.  Lactate is normal.  CBC does show elevated white count patient meets sepsis criteria so broad-spectrum antibiotics were ordered.  A liter of fluid was ordered but I held off on additional fluid given I did do a bedside ultrasound he had about 200 cc of urine in his catheter.  We attempted to flush the catheter but there was some poor drainage I wonder if it could be clotted off.  He will need to have this catheter replaced with a three-way catheter but they are coming to get patient for CT imaging so I want to try to get the CTs in process prior to switching over the catheter given his creatinine is reassuring and there is not significant retention I think it is reasonable to wait till after CT imaging.  His COVID, flu are negative.  His troponin was slightly elevated at 17 but will continue to monitor.  His chest x-ray shows some vascular congestion.  We are only going to start off with 1 L of fluid to ensure no worsening respiratory decompensation.    1:10 PM reevaluated patient he is now  withdrawing more to pain.  He is spontaneously moving his arms and family reported that he did open his eyes earlier but he is still not been verbal.  Patient's CT does not show any edema and his BNP is negative therefore will order additional 2 L of IV fluid for full 30 cc/kg resuscitation.  After discussion with granddaughter they are full scope of care and she alerted me that when he has been sick previously he has been resistant to cefepime  and and Zosyn  has worked better therefore we will do an additional dose of Zosyn .  They are requesting transfer to [facility] due to him having his prior care there.  We discussed intubation prior to transfer and family wanted to try to hold off.  If they do not get a bed at [facility]. patient has no aspiration risk regardless and would like to see if he improves mentation wise before doing any type of intubation.  They may want to have him go by private ambulance if [facility] does not accept.  Patient's Foley catheter was in the prostate and I talked to them about replacing it.  They do report that he did pull on it yesterday.  I recommend doing a three-way catheter due to the concerns that there is some blood he may require bladder irrigation however they were unable to pass any three-way catheters therefore we will just try to put a Foley in to see if it is clotting off we can call urology and put a three-way and at least to help drain his bladder so we continue fluid resuscitation.  Troponins are negative x 2.  Lactate is normal procalcitonin is elevated BNP was only slightly elevated but tolerating fluid resuscitation.  CMP shows creatinine of 1.46.  They have made multiple attempts to pass Foley but have on the been unable to.  I will discuss the case with urology.  Foley was placed.  2 L of additional fluid  will be given as well as the Zosyn .  We discussed with patient's daughter.  Patient remains only responsive and withdrawing to some pain with nasal stimulation.  We  discussed high risk for aspiration and possible intubation for this.  She wanted to hold off on intubation unless it was for procedure.  She understands there is a risk for aspiration regardless with his history but she did not want to intubate patient especially if she has to take a private vehicle to [facility] they would not want patient to be intubated for that.  She reported only wanting intubation for procedures or in the setting of cardiac arrest.  Patient be handed off to oncoming team pending discussion with [facility] to see if patient can be transferred over there.  If not then they will need to call the family member to see if patient is okay to be admitted here to our ICU or if they are going to leave AGAINST MEDICAL ADVICE.  I had extensive conversation with the granddaughter that I did not recommend doing this as patient is unstable and would do better staying in her ICU and getting more stable prior to transfer.  We discussed how we have IR capabilities to drain this abscess and that we would be able to give him antibiotics, support him through this.  Patient understands that there is a danger of aspiration, death if they decide to leave by their own means and that this is not what is recommended.     The patient is on the cardiac monitor to evaluate for evidence of arrhythmia and/or significant heart rate changes.      FINAL CLINICAL IMPRESSION(S) / ED DIAGNOSES   Final diagnoses:  Sepsis, due to unspecified organism, unspecified whether acute organ dysfunction present [facility])  Retroperitoneal abscess (HCC)     Rx / DC Orders   ED Discharge Orders     None        Note:  This document was prepared using Dragon voice recognition software and may include unintentional dictation errors.   Ernest Ronal BRAVO, MD 01/09/24 [phone]

## 2024-01-09 NOTE — ED Notes (Signed)
 [facility] called for transfer per Dr. Ernest, spoke with [facility]

## 2024-01-09 NOTE — H&P (Addendum)
 NAME:  Robert Vega, MRN:  [id number], DOB:  August 08, 1940, LOS: 0 ADMISSION DATE:  01/09/2024, CONSULTATION DATE: 01/09/2024 REFERRING MD: Dr. Willo, CHIEF COMPLAINT: Shortness of breath  History of Present Illness:  83 year old male presenting to [facility] ED from [facility] via EMS for evaluation of shortness of breath.  History obtained per chart review and family bedside including telephone discussion with HCPOA [name] report patient is unable to participate in interview at this time. Staff at [facility] noted shortness of breath and altered mental status starting the morning of 01/09/2024.  Patient was found hypoxic at 86% on his chronic 3 L nasal cannula and was more confused than his baseline. Family reported that he was at his baseline on 01/08/24, intermittently confused but conversational.  Patient has extensive medical history and is followed primarily at [facility].  He has had prolonged hospital stays since May 2025. From 10/05/23-12/16/23 he was at [facility] for treatment of sepsis s/t aspiration pneumonia in the setting of achalasia s/p PEG tube. Stay complicated by a encephalopathy, intubation, PEG tube dislodgement requiring ex-lap, wash out and PEG replacement, AKI which progressed to ESRD requiring iHD treatments. He was signed out AMA and driven by transport service to [facility] ED for evaluation. There he was admitted from 12/16/23-01/04/24 and treated for bilateral hydroureteronephrosis, forniceal rupture & bilateral urinomas who underwent cystocopy. 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 3 weeks ago he was transitioned off hemodialysis and his catheter was removed. He was discharged to [facility] while awaiting an outpatient drainage procedure for his left urinoma. Per his granddaughter this was cancelled due to noted improvement in his blood work while on Zosyn  & meropenem.    Initially, family was attempting to transfer the patient to [facility] for treatment- see previous progress note.  ED course: Upon arrival the patient was altered, febrile, tachypneic, tachycardic with soft blood pressures.  Sepsis protocol initiated and empiric antibiotics.  Family attempted to transfer patient to [facility] but was declined due to capacity.  They then attempted to transport patient via private service but due to vasopressor requirements were unable to transport him safely.  Labs significant for hypochloremia, AKI, hypoalbuminemia, leukocytosis, elevated PCT & BNP.  Imaging revealed rim-enhancing fluid collection in the left retroperitoneal space corresponding with previous hematoma/urinoma, concerning for superinfection. Medications given: Tylenol , cefepime /Flagyl /vancomycin / Zosyn  3 L IV fluid bolus, IV contrast and Levophed  drip started Initial Vitals: 103.2, 37, 120, 95/50 and 100% on 6 L nasal cannula Significant labs: (Labs/ Imaging personally reviewed) I, Jenita Ruth Rust-Chester, AGACNP-BC, personally viewed and interpreted this ECG. EKG Interpretation: Date: 01/09/2024, EKG Time: 10:17, Rate: 110, Rhythm: ST, QRS Axis: Normal, Intervals: Normal, ST/T Wave abnormalities: None, Narrative Interpretation: Sinus tachycardia Chemistry: Na+: 138, K+: 4.4, Cl: 97, BUN/Cr.:  41/1.46, Serum CO2/ AG: 28/13, albumin : 2.1 Hematology: WBC: 23.6, Hgb: 9.5,  Troponin: 17, BNP: 223.2, Lactic/ PCT: 1.6/3.80, COVID-19 & Influenza A/B: Negative  UA: +LEUKS, > 50 WBC's VBG: 7.34/61/32 >> 7.32/64/55/33  CXR 01/09/24: Cardiomegaly, vascular congestion. CT head wo contrast 01/09/24: no acute intracranial abnormality. Atrophy with chronic small vessel ischemic disease. CT angio chest PE 01/09/24: No definite evidence of pulmonary embolus. Moderately dilated and fluid-filled esophagus is noted concerning for distal esophageal obstruction such as neoplasm. Endoscopy is recommended for further  evaluation. Minimal left pleural effusion with minimal bibasilar subsegmental atelectasis. 6 mm subpleural nodule seen in right middle lobe. Non-contrast chest CT  at 6-12 months is recommended. CT abdomen/ pelvis w contrast 01/09/24: 10.8 x 3.9 x 9.4 cm rim enhancing fluid collection in the left retroperitoneal space, corresponding to the hematoma seen on the 12/04/2023 exam. A projection of this rim enhancing collection extends anterior to the ureter. Imaging features are compatible with an abscess, likely superinfection of the pre-existing hematoma. 2. Mild to moderate bilateral hydroureteronephrosis with bilateral double-J internal ureteral stents in situ, as described. 3. The balloon of the Foley catheter is positioned in the posterior penile urethra. 4. Gas in the bladder lumen may be related to instrumentation or infection. 5. Irregular bladder wall thickening at the base near the urethra. 6. Cholelithiasis. 7. 16 mm well-defined homogeneous low-density lesion in the body of the pancreas approaches water  attenuation is likely a cyst. This is stable over multiple prior studies back to 11/02/2023 but was not well seen on noncontrast CT imaging of 10/21/2023 and 10/22/2023. This is probably a benign lesions such as pseudocyst or side branch IPMN. Follow-up MRI of the abdomen with and without contrast recommended after resolution of acute symptoms when patient is better able to participate with breath holding and positioning. 8. Patulous distal esophagus is fluid-filled, likely related to reflux. 9.  Aortic Atherosclerosis  PCCM consulted for admission due to septic shock s/t bacteremia and infected urinoma on vasopressor support.  Pertinent  Medical History  HTN OSA Obesity GERD BPH Achalasia of the esophagus s/p PEG tube placement HFpEF (echo 08/2023: LVEF > 55%, G1DD) Muscle invasive urothelial carcinoma  Significant Hospital Events: Including procedures, antibiotic start and stop dates in  addition to other pertinent events   01/09/24: Admit to ICU with septic shock secondary to bacteremia and infected urinoma on vasopressor support.  High risk for intubation.   Interim History / Subjective:  Patient currently more responsive than previous assessment (see previous progress note).  RASS -3, with some purposeful movement. Vital signs stable on 5 mcgs Levophed  infusion, down from 8 mcgs. Spoke with HCPOA, granddaughter [name], and other family that was bedside.  Plan of care discussed, all questions and concerns answered at this time.  Objective    Blood pressure (!) 155/67, pulse (!) 106, temperature 98.3 F (36.8 C), temperature source Rectal, resp. rate 19, height 5' 9 (1.753 m), weight 96.5 kg, SpO2 100%.        Intake/Output Summary (Last 24 hours) at 01/09/2024 2255 Last data filed at 01/09/2024 8082 Gross per 24 hour  Intake 449.55 ml  Output --  Net 449.55 ml   Filed Weights   01/09/24 1023  Weight: 96.5 kg    Examination: General: Adult male, critically ill, lying in bed, NAD HEENT: MM pink/moist, anicteric, atraumatic, neck supple Neuro: RASS -3, unable to follow commands/purposeful movement, PERRL +3, MAE CV: s1s2 RRR, NSR on monitor, no r/m/g Pulm: Regular, non labored on 2 L nasal cannula, breath sounds diminished throughout GI: soft, rounded with PEG tube in place, non tender, bs x 4 GU: foley in place with dark amber urine Skin: Limited exam- no rashes/lesions noted Extremities: warm/dry, pulses + 2 R/P, no edema noted  Resolved problem list   Assessment and Plan  Septic shock due to enterococcus faecium bacteremia & suspected infected left urinoma Vancomycin -resistant PMHx: HTN Initial interventions/workup included: 3 L of NS/LR & Cefepime / Vancomycin / Metronidazole  & Zosyn  HCPOA reported patient doing much better at time of recent discharge on Zosyn  and meropenem.  Blood cultures positive for enterococcus faecium, vancomycin  resistant.   Discussed with pharmacy-Will cover with  Zyvox  - Supplemental oxygen as needed, to maintain SpO2 > 90% - f/u cultures, trend lactic/ PCT > stat lactic & VBG - Daily CBC, monitor WBC/ fever curve - IV antibiotics: Zyvox  - IVF hydration as needed, albumin  supplementation - Continue peripheral vasopressors to maintain MAP< 65: norepinephrine  - hold outpatient diltiazem  d/t hypotension - ID consultation, consider IR consultation for drainage of fluid collection  Acute Kidney Injury in the setting of septic shock Most recent Cr 8/11: 0.8, Cr on admission:1.46 - Strict I/O's: alert provider if UOP < 0.5 mL/kg/hr - gentle IVF hydration  - Daily BMP, replace electrolytes PRN - Avoid nephrotoxic agents as able, ensure adequate renal perfusion - Consider Nephrology consultation if renal function doesn't quickly improve, as patient recently required iHD for AKI   Mild Acute on Chronic HFpEF  - Continuous cardiac monitoring  - Daily weights to assess volume status - Diurese with the use of IV lasix  as hemodynamics and renal function allow - Supplemental oxygen as needed, maintain SpO2 > 90%  Acute on Chronic Encephalopathy Patient developed delirium after prolonged hospitalization and still has intermittent confusion, but is conversational at baseline per family. - supportive care - delirium precautions, avoid sedating medications at this time until mentation is closer to baseline  Aspiration risk s/t Achalasia s/p PEG tube placement Hypoalbuminemia Malnutrition Patient, even though more responsive is still a HIGH risk for aspiration and intubation, family understands situation - supplemental O2 PRN to maintain SpO2 > 90% - aspiration precautions - albumin  supplementation - dietary consultation for TF > family reported difficulty managing nutrition due to hyperkalemia, looking up name of feeding supplement they would recommend  Chronic Anemia- multifactorial - Monitor for s/s of bleeding -  Daily CBC - Transfuse for Hgb <7  Best Practice (right click and Reselect all SmartList Selections daily)  Diet/type: NPO w/ meds via tube DVT prophylaxis LMWH Pressure ulcer(s): UTA- limited exam GI prophylaxis: N/A Lines: N/A Foley:  Yes, and it is still needed Code Status:  full code Last date of multidisciplinary goals of care discussion [01/09/24]  Labs   CBC: Recent Labs  Lab 01/09/24 1022  WBC 23.6*  NEUTROABS 20.7*  HGB 9.5*  HCT 30.3*  MCV 90.2  PLT 473*    Basic Metabolic Panel: Recent Labs  Lab 01/09/24 1022  NA 138  K 4.4  CL 97*  CO2 28  GLUCOSE 111*  BUN 41*  CREATININE 1.46*  CALCIUM  8.4*   GFR: Estimated Creatinine Clearance: 43.2 mL/min (A) (by C-G formula based on SCr of 1.46 mg/dL (H)). Recent Labs  Lab 01/09/24 1022 01/09/24 1025  PROCALCITON  --  3.80  WBC 23.6*  --   LATICACIDVEN  --  1.6    Liver Function Tests: Recent Labs  Lab 01/09/24 1022  AST 31  ALT 28  ALKPHOS 86  BILITOT 0.3  PROT 8.1  ALBUMIN  2.1*   No results for input(s): LIPASE, AMYLASE in the last 168 hours. No results for input(s): AMMONIA in the last 168 hours.  ABG    Component Value Date/Time   PHART 7.46 (H) 11/11/2023 1712   PCO2ART 28 (L) 11/11/2023 1712   PO2ART 114 (H) 11/11/2023 1712   HCO3 33.0 (H) 01/09/2024 1736   ACIDBASEDEF 0.6 12/03/2023 1148   O2SAT 89.9 01/09/2024 1736     Coagulation Profile: No results for input(s): INR, PROTIME in the last 168 hours.  Cardiac Enzymes: No results for input(s): CKTOTAL, CKMB, CKMBINDEX, TROPONINI in the last 168 hours.  HbA1C:  Hgb A1c MFr Bld  Date/Time Value Ref Range Status  10/10/2023 03:43 AM 5.5 4.8 - 5.6 % Final    Comment:    (NOTE) Pre diabetes:          5.7%-6.4%  Diabetes:              >6.4%  Glycemic control for   <7.0% adults with diabetes     CBG: No results for input(s): GLUCAP in the last 168 hours.  Review of Systems:   UTA- patient altered and  unable to participate in a conversation at this time  Past Medical History:  He,  has a past medical history of Allergy, Arthritis, BPH (benign prostatic hypertrophy), Colonic polyp, GERD (gastroesophageal reflux disease), and Hypertension.   Surgical History:   Past Surgical History:  Procedure Laterality Date   CATARACT EXTRACTION     2011, other in 2013   COLONOSCOPY  2011   CREATION, GASTROSTOMY, OPEN N/A 10/22/2023   Procedure: CREATION, GASTROSTOMY, OPEN; GASTROSTOMY CLOSURE;  Surgeon: Marinda Jayson KIDD, MD;  Location: [facility] ORS;  Service: General;  Laterality: N/A;   DIALYSIS/PERMA CATHETER INSERTION N/A 11/21/2023   Procedure: DIALYSIS/PERMA CATHETER INSERTION;  Surgeon: Marea Selinda RAMAN, MD;  Location: [facility] INVASIVE CV LAB;  Service: Cardiovascular;  Laterality: N/A;   ESOPHAGOGASTRODUODENOSCOPY N/A 10/15/2023   Procedure: EGD (ESOPHAGOGASTRODUODENOSCOPY);  Surgeon: Jinny Carmine, MD;  Location: [facility] ENDOSCOPY;  Service: Endoscopy;  Laterality: N/A;  WILL NEED BOTOX    ESOPHAGOGASTRODUODENOSCOPY N/A 11/05/2023   Procedure: EGD (ESOPHAGOGASTRODUODENOSCOPY);  Surgeon: Jinny Carmine, MD;  Location: [facility] ENDOSCOPY;  Service: Endoscopy;  Laterality: N/A;   ESOPHAGOGASTRODUODENOSCOPY (EGD) WITH PROPOFOL  N/A 08/30/2020   Procedure: ESOPHAGOGASTRODUODENOSCOPY (EGD) WITH PROPOFOL ;  Surgeon: Therisa Bi, MD;  Location: [facility] ENDOSCOPY;  Service: Gastroenterology;  Laterality: N/A;   ESOPHAGOGASTRODUODENOSCOPY (EGD) WITH PROPOFOL  N/A 04/02/2022   Procedure: ESOPHAGOGASTRODUODENOSCOPY (EGD) WITH PROPOFOL ;  Surgeon: Therisa Bi, MD;  Location: [facility] ENDOSCOPY;  Service: Gastroenterology;  Laterality: N/A;   LAPAROTOMY N/A 10/22/2023   Procedure: LAPAROTOMY, EXPLORATORY;  Surgeon: Marinda Jayson KIDD, MD;  Location: [facility] ORS;  Service: General;  Laterality: N/A;   PEG PLACEMENT N/A 10/08/2023   Procedure: INSERTION, PEG TUBE;  Surgeon: Jinny Carmine, MD;  Location: [facility] ENDOSCOPY;  Service: Endoscopy;  Laterality:  N/A;   skin cancer removal     TEMPORARY DIALYSIS CATHETER N/A 11/11/2023   Procedure: TEMPORARY DIALYSIS CATHETER;  Surgeon: Marea Selinda RAMAN, MD;  Location: [facility] INVASIVE CV LAB;  Service: Cardiovascular;  Laterality: N/A;     Social History:   reports that he quit smoking about 9 years ago. His smoking use included pipe and cigarettes. He started smoking about 59 years ago. He has a 100 pack-year smoking history. He has been exposed to tobacco smoke. He has never used smokeless tobacco. He reports that he does not drink alcohol  and does not use drugs.   Family History:  His family history includes Hypertension in his mother. There is no history of Heart disease, Diabetes, or Cancer.   Allergies Allergies  Allergen Reactions   Sulfonamide Derivatives      Home Medications  Prior to Admission medications   Medication Sig Start Date End Date Taking? Authorizing Provider  acetaminophen  (TYLENOL ) 650 MG CR tablet Take 650 mg by mouth every morning.    [provider]  aluminum hydroxide-magnesium carbonate (GAVISCON) 95-358 MG/15ML SUSP Take by mouth.    [provider]  amLODipine  (NORVASC ) 2.5 MG tablet Take 1 tablet by mouth daily. 09/05/22   [provider]  aspirin  81 MG tablet Take 81 mg by mouth daily.    [provider]  diltiazem  (CARDIZEM  CD) 120 MG 24 hr capsule Take 120 mg by mouth daily. 07/29/23 07/28/24  [provider]  docusate sodium  (COLACE) 50 MG capsule Take 50 mg by mouth daily as needed for moderate constipation or mild constipation.    [provider]  fluticasone (FLONASE) 50 MCG/ACT nasal spray Place 2 sprays into both nostrils daily. 09/11/23   [provider]  GLUCOSAMINE-CHONDROITIN-MSM PO Take by mouth.    [provider]  lansoprazole  (PREVACID ) 30 MG capsule TAKE ONE CAPSULE BY MOUTH EVERY NIGHT AT BEDTIME 02/25/23   Jimmy Charlie FERNS, MD  Multiple Vitamins-Minerals (MULTIVITAMIN WITH MINERALS)  tablet Take 1 tablet by mouth daily.    [provider]  tadalafil  (CIALIS ) 20 MG tablet TAKE 1/2 TO 1 TABLET BY MOUTH EVERY OTHER DAY AS NEEDED FOR ERECTILE DYSFUNCTION 09/20/22   Jimmy Charlie FERNS, MD     Critical care time: 68 minutes     Jenita Jama Meek, AGACNP-BC Acute Care Nurse Practitioner [facility]   [phone] / [phone] Please see Amion for details.

## 2024-01-09 NOTE — ED Provider Notes (Signed)
-----------------------------------------   3:55 PM on 01/09/2024 -----------------------------------------  Blood pressure (!) 124/98, pulse (!) 121, temperature 98.3 F (36.8 C), temperature source Rectal, resp. rate (!) 23, height 5' 9 (1.753 m), weight 96.5 kg, SpO2 100%.  Assuming care from Dr. Ernest.  In short, Robert Vega is a 83 y.o. male with a chief complaint of Shortness of Breath .  Refer to the original H&P for additional details.  The current plan of care is to discuss possible transfer with [facility] at family request.  ----------------------------------------- 10:54 PM on 01/09/2024 ----------------------------------------- Patient was declined for transfer by [facility] due to ICU capacity.  I spoke with patient's granddaughter, [name], over the phone, who is his HCPOA.  She is insistent that patient be transferred to [facility] and is attempting to arrange private transport via ambulance to get him there.  I explained to granddaughter over the phone as well as patient's cousins that are here in the ED, that he is very high risk for aspiration and will likely need to go off of pressors for transport.  They expressed understanding of risk of death and worsening respiratory status, still wished to proceed with private transport.  [facility] transfer center was updated on possibility of patient arriving to their ED via private transport.  Granddaughter was also informed that at this point I would recommend intubation for airway protection as he remains difficult to arouse.  Repeat VBG comparable to previous.  Granddaughter expresses desire to hold off on intubation as she states this would make him unable to be transferred via BLS ambulance that family is in the process of arranging.  She was again advised that this is AGAINST MEDICAL ADVICE and carries risk of death due to patient's high risk for aspiration.  Granddaughter expresses understanding.  I have asked Jenita Ruth from the ICU to evaluate the  patient given it is unclear how long it will take for proper transportation to be arranged.  ----------------------------------------- 11:17 PM on 01/09/2024 ----------------------------------------- Family again asked to speak with me, now stating that they are in agreement with patient being admitted here given he would have to go off of pressors for transport.  ICU team accepted patient for admission.       Willo Dunnings, MD 01/09/24 [phone]

## 2024-01-09 NOTE — ED Notes (Signed)
 [facility] denied transfer acceptance due to critical care capacity  [id number]

## 2024-01-09 NOTE — Sepsis Progress Note (Signed)
 HD patient

## 2024-01-09 NOTE — ED Notes (Signed)
 Called CT to ask where pt is on their list, was told that there are 2 pt's in front of him. Advised CT that EDP was anxious to get him scanned.

## 2024-01-10 DIAGNOSIS — R7881 Bacteremia: Secondary | ICD-10-CM

## 2024-01-10 DIAGNOSIS — B952 Enterococcus as the cause of diseases classified elsewhere: Secondary | ICD-10-CM

## 2024-01-10 DIAGNOSIS — K6819 Other retroperitoneal abscess: Secondary | ICD-10-CM

## 2024-01-10 DIAGNOSIS — C679 Malignant neoplasm of bladder, unspecified: Secondary | ICD-10-CM

## 2024-01-10 DIAGNOSIS — D72829 Elevated white blood cell count, unspecified: Secondary | ICD-10-CM

## 2024-01-10 DIAGNOSIS — Z1621 Resistance to vancomycin: Secondary | ICD-10-CM

## 2024-01-10 LAB — PHOSPHORUS: Phosphorus: 3.5 mg/dL (ref 2.5–4.6)

## 2024-01-10 LAB — BLOOD GAS, VENOUS
Acid-Base Excess: 5.3 mmol/L — ABNORMAL HIGH (ref 0.0–2.0)
Bicarbonate: 33.2 mmol/L — ABNORMAL HIGH (ref 20.0–28.0)
O2 Saturation: 98.5 %
Patient temperature: 37
pCO2, Ven: 63 mmHg — ABNORMAL HIGH (ref 44–60)
pH, Ven: 7.33 (ref 7.25–7.43)
pO2, Ven: 81 mmHg — ABNORMAL HIGH (ref 32–45)

## 2024-01-10 LAB — TYPE AND SCREEN
ABO/RH(D): O POS
Antibody Screen: NEGATIVE

## 2024-01-10 LAB — BASIC METABOLIC PANEL WITH GFR
Anion gap: 13 (ref 5–15)
BUN: 38 mg/dL — ABNORMAL HIGH (ref 8–23)
CO2: 27 mmol/L (ref 22–32)
Calcium: 8.4 mg/dL — ABNORMAL LOW (ref 8.9–10.3)
Chloride: 99 mmol/L (ref 98–111)
Creatinine, Ser: 1.11 mg/dL (ref 0.61–1.24)
GFR, Estimated: >60 mL/min (ref >60)
Glucose, Bld: 172 mg/dL — ABNORMAL HIGH (ref 70–99)
Potassium: 4.4 mmol/L (ref 3.5–5.1)
Sodium: 139 mmol/L (ref 135–145)

## 2024-01-10 LAB — LACTIC ACID, PLASMA
Lactic Acid, Venous: 0.8 mmol/L (ref 0.5–1.9)
Lactic Acid, Venous: 0.8 mmol/L (ref 0.5–1.9)

## 2024-01-10 LAB — HEPATIC FUNCTION PANEL
ALT: 20 U/L (ref 0–44)
AST: 21 U/L (ref 15–41)
Albumin: 2.4 g/dL — ABNORMAL LOW (ref 3.5–5.0)
Alkaline Phosphatase: 75 U/L (ref 38–126)
Bilirubin, Direct: <0.1 mg/dL (ref 0.0–0.2)
Total Bilirubin: 0.2 mg/dL (ref 0.0–1.2)
Total Protein: 7.4 g/dL (ref 6.5–8.1)

## 2024-01-10 LAB — CBC
HCT: 26.5 % — ABNORMAL LOW (ref 39.0–52.0)
Hemoglobin: 7.9 g/dL — ABNORMAL LOW (ref 13.0–17.0)
MCH: 27.9 pg (ref 26.0–34.0)
MCHC: 29.8 g/dL — ABNORMAL LOW (ref 30.0–36.0)
MCV: 93.6 fL (ref 80.0–100.0)
Platelets: 358 K/uL (ref 150–400)
RBC: 2.83 MIL/uL — ABNORMAL LOW (ref 4.22–5.81)
RDW: 15.7 % — ABNORMAL HIGH (ref 11.5–15.5)
WBC: 25.3 K/uL — ABNORMAL HIGH (ref 4.0–10.5)
nRBC: 0 % (ref 0.0–0.2)

## 2024-01-10 LAB — MAGNESIUM: Magnesium: 2.1 mg/dL (ref 1.7–2.4)

## 2024-01-10 LAB — CBG MONITORING, ED
Glucose-Capillary: 168 mg/dL — ABNORMAL HIGH (ref 70–99)
Glucose-Capillary: 109 mg/dL — ABNORMAL HIGH (ref 70–99)
Glucose-Capillary: 93 mg/dL (ref 70–99)

## 2024-01-10 LAB — MRSA NEXT GEN BY PCR, NASAL: MRSA by PCR Next Gen: NOT DETECTED

## 2024-01-10 MED ORDER — OSMOLITE 1.5 CAL PO LIQD: 237.0000 mL | Freq: Every day | ORAL | Status: DC

## 2024-01-10 MED ORDER — MIDODRINE HCL 5 MG PO TABS
2.5000 mg | ORAL_TABLET | Freq: Three times a day (TID) | ORAL | Status: DC
  Filled 2024-01-10: qty 1

## 2024-01-10 MED ORDER — MIDODRINE HCL 5 MG PO TABS
5.0000 mg | ORAL_TABLET | Freq: Three times a day (TID) | ORAL | Status: DC
  Filled 2024-01-10: qty 1

## 2024-01-10 MED ORDER — FREE WATER
100.0000 mL | Freq: Every day | Status: DC
  Filled 2024-01-10 (×3): qty 100

## 2024-01-10 MED ORDER — VITAL HP 1.0 CAL PO LIQD: 1000.0000 mL | ORAL | Status: DC

## 2024-01-10 MED ORDER — LACTATED RINGERS IV SOLN: INTRAVENOUS | Status: DC

## 2024-01-10 MED ORDER — MIDODRINE HCL 5 MG PO TABS
5.0000 mg | ORAL_TABLET | Freq: Three times a day (TID) | ORAL | Status: DC
  Administered 2024-01-10: 5 mg

## 2024-01-10 NOTE — Progress Notes (Signed)
 NAME:  Robert Vega, MRN:  [id number], DOB:  1940-10-08, LOS: 1 ADMISSION DATE:  01/09/2024, CONSULTATION DATE: 01/09/2024 REFERRING MD: Dr. Willo, CHIEF COMPLAINT: Shortness of breath  History of Present Illness:  83 year old male presenting to [facility] from [facility] via EMS for evaluation of shortness of breath.  History obtained per chart review and family bedside including telephone discussion with HCPOA [name] report patient is unable to participate in interview at this time. Staff at [facility] noted shortness of breath and altered mental status starting the morning of 01/09/2024.  Patient was found hypoxic at 86% on his chronic 3 L nasal cannula and was more confused than his baseline. Family reported that he was at his baseline on 01/08/24, intermittently confused but conversational.  Patient has extensive medical history and is followed primarily at [facility].  He has had prolonged hospital stays since May 2025. From 10/05/23-12/16/23 he was at [facility] for treatment of sepsis s/t aspiration pneumonia in the setting of achalasia s/p PEG tube. Stay complicated by a encephalopathy, intubation, PEG tube dislodgement requiring ex-lap, wash out and PEG replacement, AKI which progressed to ESRD requiring iHD treatments. He was signed out AMA and driven by transport service to [facility] ED for evaluation. There he was admitted from 12/16/23-01/04/24 and treated for bilateral hydroureteronephrosis, forniceal rupture & bilateral urinomas who underwent cystocopy. 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 3 weeks ago he was transitioned off hemodialysis and his catheter was removed. He was discharged to [facility] while awaiting an outpatient drainage procedure for his left urinoma. Per his granddaughter this was cancelled due to noted improvement in his blood work while on Zosyn  & meropenem.    Initially, family was attempting to transfer the patient to [facility] for treatment- see previous progress note.  ED course: Upon arrival the patient was altered, febrile, tachypneic, tachycardic with soft blood pressures.  Sepsis protocol initiated and empiric antibiotics.  Family attempted to transfer patient to [facility] but was declined due to capacity.  They then attempted to transport patient via private service but due to vasopressor requirements were unable to transport him safely.  Labs significant for hypochloremia, AKI, hypoalbuminemia, leukocytosis, elevated PCT & BNP.  Imaging revealed rim-enhancing fluid collection in the left retroperitoneal space corresponding with previous hematoma/urinoma, concerning for superinfection. Medications given: Tylenol , cefepime /Flagyl /vancomycin / Zosyn  3 L IV fluid bolus, IV contrast and Levophed  drip started Initial Vitals: 103.2, 37, 120, 95/50 and 100% on 6 L nasal cannula Significant labs: (Labs/ Imaging personally reviewed) I, Jenita Ruth Rust-Chester, AGACNP-BC, personally viewed and interpreted this ECG. EKG Interpretation: Date: 01/09/2024, EKG Time: 10:17, Rate: 110, Rhythm: ST, QRS Axis: Normal, Intervals: Normal, ST/T Wave abnormalities: None, Narrative Interpretation: Sinus tachycardia Chemistry: Na+: 138, K+: 4.4, Cl: 97, BUN/Cr.:  41/1.46, Serum CO2/ AG: 28/13, albumin : 2.1 Hematology: WBC: 23.6, Hgb: 9.5,  Troponin: 17, BNP: 223.2, Lactic/ PCT: 1.6/3.80, COVID-19 & Influenza A/B: Negative  UA: +LEUKS, > 50 WBC's VBG: 7.34/61/32 >> 7.32/64/55/33  CXR 01/09/24: Cardiomegaly, vascular congestion. CT head wo contrast 01/09/24: no acute intracranial abnormality. Atrophy with chronic small vessel ischemic disease. CT angio chest PE 01/09/24: No definite evidence of pulmonary embolus. Moderately dilated and fluid-filled esophagus is noted concerning for distal esophageal obstruction such as neoplasm. Endoscopy is recommended for further  evaluation. Minimal left pleural effusion with minimal bibasilar subsegmental atelectasis. 6 mm subpleural nodule seen in right middle lobe. Non-contrast chest CT  at 6-12 months is recommended. CT abdomen/ pelvis w contrast 01/09/24: 10.8 x 3.9 x 9.4 cm rim enhancing fluid collection in the left retroperitoneal space, corresponding to the hematoma seen on the 12/04/2023 exam. A projection of this rim enhancing collection extends anterior to the ureter. Imaging features are compatible with an abscess, likely superinfection of the pre-existing hematoma. 2. Mild to moderate bilateral hydroureteronephrosis with bilateral double-J internal ureteral stents in situ, as described. 3. The balloon of the Foley catheter is positioned in the posterior penile urethra. 4. Gas in the bladder lumen may be related to instrumentation or infection. 5. Irregular bladder wall thickening at the base near the urethra. 6. Cholelithiasis. 7. 16 mm well-defined homogeneous low-density lesion in the body of the pancreas approaches water  attenuation is likely a cyst. This is stable over multiple prior studies back to 11/02/2023 but was not well seen on noncontrast CT imaging of 10/21/2023 and 10/22/2023. This is probably a benign lesions such as pseudocyst or side branch IPMN. Follow-up MRI of the abdomen with and without contrast recommended after resolution of acute symptoms when patient is better able to participate with breath holding and positioning. 8. Patulous distal esophagus is fluid-filled, likely related to reflux. 9.  Aortic Atherosclerosis  01/10/24- patient is off vasopressor support during my evaluation.  He is able to speak and reports no distress.  Family request against medical advice leave to [facility] ER where they will attempt to have admission there despite being full to max capacity.  The patient/family refuse to stay at [facility]  Pertinent  Medical History  HTN OSA Obesity GERD BPH Achalasia of the esophagus s/p PEG  tube placement HFpEF (echo 08/2023: LVEF > 55%, G1DD) Muscle invasive urothelial carcinoma  Significant Hospital Events: Including procedures, antibiotic start and stop dates in addition to other pertinent events   01/09/24: Admit to ICU with septic shock secondary to bacteremia and infected urinoma on vasopressor support.  High risk for intubation.   Interim History / Subjective:  Patient currently more responsive than previous assessment (see previous progress note).  RASS -3, with some purposeful movement. Vital signs stable on 5 mcgs Levophed  infusion, down from 8 mcgs. Spoke with HCPOA, granddaughter [name], and other family that was bedside.  Plan of care discussed, all questions and concerns answered at this time.  Objective    Blood pressure (!) 104/59, pulse 72, temperature 98.4 F (36.9 C), resp. rate 19, height 5' 9 (1.753 m), weight 96.5 kg, SpO2 100%.        Intake/Output Summary (Last 24 hours) at 01/10/2024 1327 Last data filed at 01/10/2024 1110 Gross per 24 hour  Intake 1030.74 ml  Output 2550 ml  Net -1519.26 ml   Filed Weights   01/09/24 1023  Weight: 96.5 kg    Examination: General: Adult male, critically ill, lying in bed, NAD HEENT: MM pink/moist, anicteric, atraumatic, neck supple Neuro: RASS -3, unable to follow commands/purposeful movement, PERRL +3, MAE CV: s1s2 RRR, NSR on monitor, no r/m/g Pulm: Regular, non labored on 2 L nasal cannula, breath sounds diminished throughout GI: soft, rounded with PEG tube in place, non tender, bs x 4 GU: foley in place with dark amber urine Skin: Limited exam- no rashes/lesions noted Extremities: warm/dry, pulses + 2 R/P, no edema noted  Resolved problem list   Assessment and Plan  Septic shock due to enterococcus faecium bacteremia & suspected infected left urinoma- PRESENT ON ADMISSION Vancomycin -resistant PMHx: HTN Initial interventions/workup included: 3 L of NS/LR & Cefepime /  Vancomycin / Metronidazole  &  Zosyn  HCPOA reported patient doing much better at time of recent discharge on Zosyn  and meropenem.  Blood cultures positive for enterococcus faecium, vancomycin  resistant.  Discussed with pharmacy-Will cover with Zyvox  - Supplemental oxygen as needed, to maintain SpO2 > 90% - f/u cultures, trend lactic/ PCT > stat lactic & VBG - Daily CBC, monitor WBC/ fever curve - IV antibiotics: Zyvox  - IVF hydration as needed, albumin  supplementation - Continue peripheral vasopressors to maintain MAP< 65: norepinephrine  - hold outpatient diltiazem  d/t hypotension - ID consultation, consider IR consultation for drainage of fluid collection  Acute Kidney Injury in the setting of septic shock Most recent Cr 8/11: 0.8, Cr on admission:1.46 - Strict I/O's: alert provider if UOP < 0.5 mL/kg/hr - gentle IVF hydration  - Daily BMP, replace electrolytes PRN - Avoid nephrotoxic agents as able, ensure adequate renal perfusion - Consider Nephrology consultation if renal function doesn't quickly improve, as patient recently required iHD for AKI   Mild Acute on Chronic HFpEF  - Continuous cardiac monitoring  - Daily weights to assess volume status - Diurese with the use of IV lasix  as hemodynamics and renal function allow - Supplemental oxygen as needed, maintain SpO2 > 90%  Acute on Chronic Encephalopathy Patient developed delirium after prolonged hospitalization and still has intermittent confusion, but is conversational at baseline per family. - supportive care - delirium precautions, avoid sedating medications at this time until mentation is closer to baseline  Aspiration risk s/t Achalasia s/p PEG tube placement Hypoalbuminemia Malnutrition Patient, even though more responsive is still a HIGH risk for aspiration and intubation, family understands situation - supplemental O2 PRN to maintain SpO2 > 90% - aspiration precautions - albumin  supplementation - dietary consultation for TF > family reported  difficulty managing nutrition due to hyperkalemia, looking up name of feeding supplement they would recommend  Chronic Anemia- multifactorial - Monitor for s/s of bleeding - Daily CBC - Transfuse for Hgb <7  Best Practice (right click and Reselect all SmartList Selections daily)  Diet/type: NPO w/ meds via tube DVT prophylaxis LMWH Pressure ulcer(s): UTA- limited exam GI prophylaxis: N/A Lines: N/A Foley:  Yes, and it is still needed Code Status:  full code Last date of multidisciplinary goals of care discussion [01/09/24]  Labs   CBC: Recent Labs  Lab 01/09/24 1022 01/10/24 0624  WBC 23.6* 25.3*  NEUTROABS 20.7*  --   HGB 9.5* 7.9*  HCT 30.3* 26.5*  MCV 90.2 93.6  PLT 473* 358    Basic Metabolic Panel: Recent Labs  Lab 01/09/24 1022 01/10/24 0114 01/10/24 0624  NA 138 139  --   K 4.4 4.4  --   CL 97* 99  --   CO2 28 27  --   GLUCOSE 111* 172*  --   BUN 41* 38*  --   CREATININE 1.46* 1.11  --   CALCIUM  8.4* 8.4*  --   MG  --   --  2.1  PHOS  --   --  3.5   GFR: Estimated Creatinine Clearance: 56.8 mL/min (by C-G formula based on SCr of 1.11 mg/dL). Recent Labs  Lab 01/09/24 1022 01/09/24 1025 01/10/24 0114 01/10/24 0624  PROCALCITON  --  3.80  --   --   WBC 23.6*  --   --  25.3*  LATICACIDVEN  --  1.6 0.8 0.8    Liver Function Tests: Recent Labs  Lab 01/09/24 1022 01/10/24 0624  AST 31 21  ALT 28 20  ALKPHOS 86 75  BILITOT 0.3 0.2  PROT 8.1 7.4  ALBUMIN  2.1* 2.4*   No results for input(s): LIPASE, AMYLASE in the last 168 hours. No results for input(s): AMMONIA in the last 168 hours.  ABG    Component Value Date/Time   PHART 7.46 (H) 11/11/2023 1712   PCO2ART 28 (L) 11/11/2023 1712   PO2ART 114 (H) 11/11/2023 1712   HCO3 33.2 (H) 01/10/2024 0114   ACIDBASEDEF 0.6 12/03/2023 1148   O2SAT 98.5 01/10/2024 0114     Coagulation Profile: No results for input(s): INR, PROTIME in the last 168 hours.  Cardiac Enzymes: No  results for input(s): CKTOTAL, CKMB, CKMBINDEX, TROPONINI in the last 168 hours.  HbA1C: Hgb A1c MFr Bld  Date/Time Value Ref Range Status  10/10/2023 03:43 AM 5.5 4.8 - 5.6 % Final    Comment:    (NOTE) Pre diabetes:          5.7%-6.4%  Diabetes:              >6.4%  Glycemic control for   <7.0% adults with diabetes     CBG: Recent Labs  Lab 01/10/24 0129 01/10/24 0631 01/10/24 0959  GLUCAP 168* 109* 93    Review of Systems:   UTA- patient altered and unable to participate in a conversation at this time  Past Medical History:  He,  has a past medical history of Allergy, Arthritis, BPH (benign prostatic hypertrophy), Colonic polyp, GERD (gastroesophageal reflux disease), and Hypertension.   Surgical History:   Past Surgical History:  Procedure Laterality Date   CATARACT EXTRACTION     2011, other in 2013   COLONOSCOPY  2011   CREATION, GASTROSTOMY, OPEN N/A 10/22/2023   Procedure: CREATION, GASTROSTOMY, OPEN; GASTROSTOMY CLOSURE;  Surgeon: Marinda Jayson KIDD, MD;  Location: [facility] ORS;  Service: General;  Laterality: N/A;   DIALYSIS/PERMA CATHETER INSERTION N/A 11/21/2023   Procedure: DIALYSIS/PERMA CATHETER INSERTION;  Surgeon: Marea Selinda RAMAN, MD;  Location: [facility] INVASIVE CV LAB;  Service: Cardiovascular;  Laterality: N/A;   ESOPHAGOGASTRODUODENOSCOPY N/A 10/15/2023   Procedure: EGD (ESOPHAGOGASTRODUODENOSCOPY);  Surgeon: Jinny Carmine, MD;  Location[facility] ENDOSCOPY;  Service: Endoscopy;  Laterality: N/A;  WILL NEED BOTOX    ESOPHAGOGASTRODUODENOSCOPY N/A 11/05/2023   Procedure: EGD (ESOPHAGOGASTRODUODENOSCOPY);  Surgeon: Jinny Carmine, MD;  Location: [facility] ENDOSCOPY;  Service: Endoscopy;  Laterality: N/A;   ESOPHAGOGASTRODUODENOSCOPY (EGD) WITH PROPOFOL  N/A 08/30/2020   Procedure: ESOPHAGOGASTRODUODENOSCOPY (EGD) WITH PROPOFOL ;  Surgeon: Therisa Bi, MD;  Location: [facility] ENDOSCOPY;  Service: Gastroenterology;  Laterality: N/A;   ESOPHAGOGASTRODUODENOSCOPY (EGD) WITH PROPOFOL   N/A 04/02/2022   Procedure: ESOPHAGOGASTRODUODENOSCOPY (EGD) WITH PROPOFOL ;  Surgeon: Therisa Bi, MD;  Location: [facility] ENDOSCOPY;  Service: Gastroenterology;  Laterality: N/A;   LAPAROTOMY N/A 10/22/2023   Procedure: LAPAROTOMY, EXPLORATORY;  Surgeon: Marinda Jayson KIDD, MD;  Location: [facility] ORS;  Service: General;  Laterality: N/A;   PEG PLACEMENT N/A 10/08/2023   Procedure: INSERTION, PEG TUBE;  Surgeon: Jinny Carmine, MD;  Location: [facility] ENDOSCOPY;  Service: Endoscopy;  Laterality: N/A;   skin cancer removal     TEMPORARY DIALYSIS CATHETER N/A 11/11/2023   Procedure: TEMPORARY DIALYSIS CATHETER;  Surgeon: Marea Selinda RAMAN, MD;  Location: [facility] INVASIVE CV LAB;  Service: Cardiovascular;  Laterality: N/A;     Social History:   reports that he quit smoking about 9 years ago. His smoking use included pipe and cigarettes. He started smoking about 59 years ago. He has a 100 pack-year smoking history. He has been exposed to tobacco smoke. He  has never used smokeless tobacco. He reports that he does not drink alcohol  and does not use drugs.   Family History:  His family history includes Hypertension in his mother. There is no history of Heart disease, Diabetes, or Cancer.   Allergies Allergies  Allergen Reactions   Sulfonamide Derivatives      Home Medications  Prior to Admission medications   Medication Sig Start Date End Date Taking? Authorizing Provider  acetaminophen  (TYLENOL ) 650 MG CR tablet Take 650 mg by mouth every morning.    [provider]  aluminum hydroxide-magnesium carbonate (GAVISCON) 95-358 MG/15ML SUSP Take by mouth.    [provider]  amLODipine  (NORVASC ) 2.5 MG tablet Take 1 tablet by mouth daily. 09/05/22   [provider]  aspirin  81 MG tablet Take 81 mg by mouth daily.    [provider]  diltiazem  (CARDIZEM  CD) 120 MG 24 hr capsule Take 120 mg by mouth daily. 07/29/23 07/28/24  [provider]  docusate sodium  (COLACE) 50 MG capsule Take  50 mg by mouth daily as needed for moderate constipation or mild constipation.    [provider]  fluticasone (FLONASE) 50 MCG/ACT nasal spray Place 2 sprays into both nostrils daily. 09/11/23   [provider]  GLUCOSAMINE-CHONDROITIN-MSM PO Take by mouth.    [provider]  lansoprazole  (PREVACID ) 30 MG capsule TAKE ONE CAPSULE BY MOUTH EVERY NIGHT AT BEDTIME 02/25/23   Jimmy Charlie FERNS, MD  Multiple Vitamins-Minerals (MULTIVITAMIN WITH MINERALS) tablet Take 1 tablet by mouth daily.    [provider]  tadalafil  (CIALIS ) 20 MG tablet TAKE 1/2 TO 1 TABLET BY MOUTH EVERY OTHER DAY AS NEEDED FOR ERECTILE DYSFUNCTION 09/20/22   Jimmy Charlie FERNS, MD     Critical care provider statement:   Total critical care time: 33 minutes   Performed by: Parris MD   Critical care time was exclusive of separately billable procedures and treating other patients.   Critical care was necessary to treat or prevent imminent or life-threatening deterioration.   Critical care was time spent personally by me on the following activities: development of treatment plan with patient and/or surrogate as well as nursing, discussions with consultants, evaluation of patient's response to treatment, examination of patient, obtaining history from patient or surrogate, ordering and performing treatments and interventions, ordering and review of laboratory studies, ordering and review of radiographic studies, pulse oximetry and re-evaluation of patient's condition.    Bronco Mcgrory, M.D.  Pulmonary & Critical Care Medicine

## 2024-01-10 NOTE — Consult Note (Addendum)
 NAME: Robert Vega  DOB: December 13, 1940  MRN: [id number]  Date/Time: 01/10/2024 11:23 AM  REQUESTING PROVIDER: Neoma Fret Subjective:  REASON FOR CONSULT: VRE bacteremia No history available from patient.  Chart reviewed.  Know him from previous hospitalization. Robert Vega is a 83 y.o. male with history of hypertension, sleep apnea, obesity, GERD, achalasia, Botox  injections, [facility] with shortness of breath on 01/09/2024.SABRA  When EMS went to pick he they found him to be on 3 L nasal cannula oxygen with 86% pulse ox.  Vitals in the ED BP of 85/51, temperature of 103.2, pulse rate of 109, sats of 93% and respiratory rate of 30.  WBC was 23.6, Hb 9.5, platelet 473 and creatinine of 1.46.  He was in septic shock and blood cultures were sent and received IV fluids and broad-spectrum antibiotic with vancomycin  and Zosyn .  The blood culture came back positive for VRE and antibiotics was changed to linezolid  IV. Patient also underwent a CT abdomen and pelvis and that showed a 10 X4x10cm rim-enhancing fluid collection in the left retroperitoneal space concerning for infected hematoma / abscess.  The patient was admitted to the ICU service. He was also seen by urologist for urinary retention and a coud catheter was placed and 600 cc of amber-colored urine was removed.  The granddaughter wanted the patient to be transferred to [facility] but as the patient was septic and unstable he was resuscitated first.  ICU recommended IR to place a drain for the perinephric collection but granddaughter did not wanted to be done here but at [facility].  I am seeing the patient because the blood culture came back positive for vancomycin -resistant Enterococcus  faecium  Past medical history Patient has been in the healthcare system for the past 4 months.  Complicated medical history Between 10/05/23-12/16/23 he was at [facility].  He was admitted on 10/05/2023 for weakness hypoxia  aspiration  7/21-8/11/25 Admitted with acute hypoxic respiratory failure secondary to Aspiration pneumonia due to the achalasia and was briefly intubated and extubated on 10/09/2023 he had a PEG insertion on 10/08/2023 5/20/202525 PEG dislodged  10/22/2023 underwent exploratory laparotomy for dislodged PEG in the abdominal cavity  11/05/23 he had a hemodialysis catheter and was started on dialysis for acute renal injury 11/21/2023 permanent HD catheter was placed He was persistently encephalopathic during the whole stay I saw the patient for persistent leukocytosis on 12/04/2023.  It was thought to be multifactorial secondary to possibly aspiration pneumonia and B/L  hydronephrosis with perinephric stranding and perinephric hematoma.  He had multiple negative blood cultures.  He had 1 urine culture positive for Enterococcus faecalis. He had been on multiple courses of antibiotics. Antibiotic therapy Ceftriaxone  5/10-5/14 Azithro 5/10-5/11 Metronidazole  5/10 Zosyn  5/26-5/31 Cefepime  6/30-7/3 Zosyn  7/3 >>7/15 vanco 6/30-7/5 He was evaluated by urologist for bilateral hydronephrosis of unclear etiology.  The CT scan also showed bladder wall thickening.  There was a question whether he had bladder outlet obstruction.  There was a question of a left perinephric hematoma as well.  The Zosyn  was discontinued on 12/10/2023 and the white count had dropped to 13.1. On 12/16/2023 the granddaughter signed him out AMA and took him to [place].  At [facility] he was seen by urologist for the bilateral hydro on nephrosis complicated by forniceal rupture and urinoma's.  A Foley was placed.  And broad-spectrum antibiotics were initiated.  ID was consulted.  And antibiotics were initially stopped but restarted on 12/22/2023 for uptrending leukocytosis.  SABRA  A cystoscopy done on 12/24/2023 showed muscle invasive bladder cancer and he underwent transurethral resection of the bladder tumor, placement of bilateral ureteral stents.  The  pathology was malignancy. A after receiving a dose of vancomycin , meropenem and micafungin on 12/17/2023 they were discontinued. Was restarted on piperacillin /tazobactam on 12/22/2023.  On 8/ 7 a repeat CT chest abdomen and pelvis showed decreased right perinephric collection and mild increase in left perinephric collection with resolution of bilateral hydronephrosis and stable nonocclusive clot in the right IJ and SVC and patchy opacities right lower lobe possible aspiration.  ID saw him again on 01/03/2024  and antibiotics were discontinued.     Past Medical History:  Diagnosis Date   Allergy    Arthritis    BPH (benign prostatic hypertrophy)    Colonic polyp    GERD (gastroesophageal reflux disease)    Has LPR   Hypertension     Past Surgical History:  Procedure Laterality Date   CATARACT EXTRACTION     2011, other in 2013   COLONOSCOPY  2011   CREATION, GASTROSTOMY, OPEN N/A 10/22/2023   Procedure: CREATION, GASTROSTOMY, OPEN; GASTROSTOMY CLOSURE;  Surgeon: Marinda Jayson KIDD, MD;  Location: [facility] ORS;  Service: General;  Laterality: N/A;   DIALYSIS/PERMA CATHETER INSERTION N/A 11/21/2023   Procedure: DIALYSIS/PERMA CATHETER INSERTION;  Surgeon: Marea Selinda RAMAN, MD;  Location: [facility] INVASIVE CV LAB;  Service: Cardiovascular;  Laterality: N/A;   ESOPHAGOGASTRODUODENOSCOPY N/A 10/15/2023   Procedure: EGD (ESOPHAGOGASTRODUODENOSCOPY);  Surgeon: Jinny Carmine, MD;  Location: [facility] ENDOSCOPY;  Service: Endoscopy;  Laterality: N/A;  WILL NEED BOTOX    ESOPHAGOGASTRODUODENOSCOPY N/A 11/05/2023   Procedure: EGD (ESOPHAGOGASTRODUODENOSCOPY);  Surgeon: Jinny Carmine, MD;  Location: [facility] ENDOSCOPY;  Service: Endoscopy;  Laterality: N/A;   ESOPHAGOGASTRODUODENOSCOPY (EGD) WITH PROPOFOL  N/A 08/30/2020   Procedure: ESOPHAGOGASTRODUODENOSCOPY (EGD) WITH PROPOFOL ;  Surgeon: Therisa Bi, MD;  Location: [facility] ENDOSCOPY;  Service: Gastroenterology;  Laterality: N/A;   ESOPHAGOGASTRODUODENOSCOPY (EGD) WITH PROPOFOL  N/A  04/02/2022   Procedure: ESOPHAGOGASTRODUODENOSCOPY (EGD) WITH PROPOFOL ;  Surgeon: Therisa Bi, MD;  Location: [facility] ENDOSCOPY;  Service: Gastroenterology;  Laterality: N/A;   LAPAROTOMY N/A 10/22/2023   Procedure: LAPAROTOMY, EXPLORATORY;  Surgeon: Marinda Jayson KIDD, MD;  Location: [facility] ORS;  Service: General;  Laterality: N/A;   PEG PLACEMENT N/A 10/08/2023   Procedure: INSERTION, PEG TUBE;  Surgeon: Jinny Carmine, MD;  Location: [facility] ENDOSCOPY;  Service: Endoscopy;  Laterality: N/A;   skin cancer removal     TEMPORARY DIALYSIS CATHETER N/A 11/11/2023   Procedure: TEMPORARY DIALYSIS CATHETER;  Surgeon: Marea Selinda RAMAN, MD;  Location: [facility] INVASIVE CV LAB;  Service: Cardiovascular;  Laterality: N/A;    Social History   Socioeconomic History   Marital status: Divorced    Spouse name: Not on file   Number of children: 2   Years of education: Not on file   Highest education level: Not on file  Occupational History   Occupation: retired--car dealer/sales  Tobacco Use   Smoking status: Former    Current packs/day: 0.00    Average packs/day: 2.0 packs/day for 50.0 years (100.0 ttl pk-yrs)    Types: Pipe, Cigarettes    Start date: 10/19/1964    Quit date: 10/20/2014    Years since quitting: 9.2    Passive exposure: Past   Smokeless tobacco: Never   Tobacco comments:    pipe   Vaping Use   Vaping status: Never Used  Substance and Sexual Activity   Alcohol  use: No    Alcohol /week: 0.0 standard drinks of  alcohol    Drug use: No   Sexual activity: Not on file  Other Topics Concern   Not on file  Social History Narrative   Has living will   Two sons and granddaughter are health care POAs (GD is first ---is DPT)   Would accept resuscitation and tube feedings if appropriate   Social Drivers of Health   Financial Resource Strain: Low Risk  (12/23/2023)   Received from [facility]   Overall Financial Resource Strain (CARDIA)    Difficulty of Paying Living Expenses: Not very  hard  Food Insecurity: No Food Insecurity (12/23/2023)   [facility]   Hunger Vital Sign    Within the past 12 months, you worried that your food would run out before you got the money to buy more.: Never true    Within the past 12 months, the food you bought just didn't last and you didn't have money to get more.: Never true  Transportation Needs: No Transportation Needs (12/23/2023)   Received from [facility] - Transportation    In the past 12 months, has lack of transportation kept you from medical appointments or from getting medications?: No    Lack of Transportation (Non-Medical): No  Physical Activity: Not on file  Stress: Not on file  Social Connections: Unknown (10/06/2023)   Social Connection and Isolation Panel    Frequency of Communication with Friends and Family: Patient unable to answer    Frequency of Social Gatherings with Friends and Family: Patient unable to answer    Attends Religious Services: Patient unable to answer    Active Member of Clubs or Organizations: Patient unable to answer    Attends Banker Meetings: Patient unable to answer    Marital Status: Not on file  Intimate Partner Violence: Patient Unable To Answer (10/06/2023)   Humiliation, Afraid, Rape, and Kick questionnaire    Fear of Current or Ex-Partner: Patient unable to answer    Emotionally Abused: Patient unable to answer    Physically Abused: Patient unable to answer    Sexually Abused: Patient unable to answer    Family History  Problem Relation Age of Onset   Hypertension Mother    Heart disease Neg Hx    Diabetes Neg Hx    Cancer Neg Hx    Allergies  Allergen Reactions   Sulfonamide Derivatives    I? Current Facility-Administered Medications  Medication Dose Route Frequency Provider Last Rate Last Admin   Chlorhexidine  Gluconate Cloth 2 % PADS 6 each  6 each Topical Daily Rust-Chester, Britton L, NP       docusate  sodium (COLACE) capsule 100 mg  100 mg Oral BID PRN Rust-Chester, Britton L, NP       enoxaparin  (LOVENOX ) injection 47.5 mg  0.5 mg/kg Subcutaneous Q24H Rust-Chester, Britton L, NP   47.5 mg at 01/10/24 0009   linezolid  (ZYVOX ) IVPB 600 mg  600 mg Intravenous Q12H Rust-Chester, Britton L, NP 300 mL/hr at 01/10/24 1010 600 mg at 01/10/24 1010   midodrine  (PROAMATINE ) tablet 5 mg  5 mg Per Tube TID WC Aleskerov, Fuad, MD   5 mg at 01/10/24 1015   norepinephrine  (LEVOPHED ) 4mg  in (0.016 mg/mL) premix infusion  0-10 mcg/min Intravenous Titrated Ernest Ronal BRAVO, MD   Held at 01/10/24 0957   polyethylene glycol (MIRALAX  / GLYCOLAX ) packet 17 g  17 g Oral Daily PRN Rust-Chester, Jenita CROME, NP  Current Outpatient Medications  Medication Sig Dispense Refill   acetaminophen  (TYLENOL ) 325 MG tablet Take 975 mg by mouth.  Take 3 tablets (975 mg total) by mouth 3 (three) times a day     aspirin  81 MG tablet Take 81 mg by mouth daily.     caffeine 200 MG TABS tablet Please give HALF of a tablet via G tube daily at 8 AM.     Calcium  Carb-Cholecalciferol (CALCIUM  + D3) 250-3 MG-MCG TABS 1 tablet by Enteral route.  Take 1 tablet by G tube once daily     Cholecalciferol (VITAMIN D -1000 MAX ST) 25 MCG (1000 UT) tablet Take 1 tablet (1,000 Units total) by mouth once daily Please give via G tube     cyanocobalamin  100 MCG tablet Take 1 tablet (100 mcg total) by G tube once daily     docusate (COLACE) 50 MG/5ML liquid Take 5 mLs (50 mg total) by mouth 2 (two) times daily as needed for Constipation (If he has not had a BM >24 hours) for up to 10 days     enoxaparin  (LOVENOX ) 40 MG/0.4ML injection Inject 0.4 mLs (40 mg total) subcutaneously once daily in abdomen, rotating site with each injection.     Ferrous Fumarate (HEMOCYTE - 106 MG FE) 324 (106 Fe) MG TABS tablet Take 1 tablet (324 mg total) by G tube daily with breakfast     fluticasone (FLONASE) 50 MCG/ACT nasal spray Place 2 sprays into both nostrils  daily.     folic acid  (FOLVITE ) 1 MG tablet Take 1 tablet (1 mg total) by G tube once daily     Lactobacillus Rhamnosus, GG, (CULTURELLE PROBIOTICS KIDS) 5 B CELL PACK Take 1 packet by mouth once daily Please give via G tube     magnesium oxide (MAG-OX) 400 MG tablet Take 1 tablet (400 mg total) by G tube 2 (two) times daily     Melatonin 1 MG/ML LIQD 3 mg by Enteral route.  Take 3 mLs (3 mg total) by G tube at bedtime     Nutritional Supplements (NUTREN 1.5) LIQD 240 mLs by Feeding Tube route daily.     pantoprazole  (PROTONIX ) 2 mg/mL suspension Place 40 mg into feeding tube daily.     simethicone  (GAS RELIEF INFANTS) 40 MG/0.6ML drops Take 1.2 mLs (80 mg total) by G tube 3 (three) times daily for 10 days     thiamine  (VITAMIN B1) 100 MG tablet Take 1 tablet (100 mg total) by G tube once daily     torsemide (DEMADEX) 10 MG tablet Take 1 tablet (10 mg total) by G tube every other day     traZODone  (DESYREL ) 50 MG tablet Take 0.5 tablets (25 mg total) by G tube once daily     aluminum hydroxide-magnesium carbonate (GAVISCON) 95-358 MG/15ML SUSP Take by mouth. (Patient not taking: Reported on 01/10/2024)     amLODipine  (NORVASC ) 2.5 MG tablet Take 1 tablet by mouth daily. (Patient not taking: Reported on 01/10/2024)     diltiazem  (CARDIZEM  CD) 120 MG 24 hr capsule Take 120 mg by mouth daily. (Patient not taking: Reported on 01/10/2024)     GLUCOSAMINE-CHONDROITIN-MSM PO Take by mouth.     lansoprazole  (PREVACID ) 30 MG capsule TAKE ONE CAPSULE BY MOUTH EVERY NIGHT AT BEDTIME (Patient not taking: Reported on 01/10/2024) 90 capsule 2   Multiple Vitamins-Minerals (MULTIVITAMIN WITH MINERALS) tablet Take 1 tablet by mouth daily. (Patient not taking: Reported on 01/10/2024)     omeprazole  (KONVOMEP) 2 mg/mL  SUSP oral suspension Place 20 mg into feeding tube daily. V (Patient not taking: Reported on 01/10/2024)     ondansetron  (ZOFRAN -ODT) 4 MG disintegrating tablet Take 1 tablet (4 mg total) by mouth every 8  (eight) hours as needed for Nausea or Vomiting for up to 7 days (Patient not taking: Reported on 01/10/2024)     tadalafil  (CIALIS ) 20 MG tablet TAKE 1/2 TO 1 TABLET BY MOUTH EVERY OTHER DAY AS NEEDED FOR ERECTILE DYSFUNCTION 5 tablet 11     Abtx:  Anti-infectives (From admission, onward)    Start     Dose/Rate Route Frequency Ordered Stop   01/09/24 2345  linezolid  (ZYVOX ) IVPB 600 mg        600 mg 300 mL/hr over 60 Minutes Intravenous Every 12 hours 01/09/24 2342     01/09/24 2315  meropenem (MERREM) 1 g in sodium chloride  0.9 % 100 mL IVPB  Status:  Discontinued        1 g 200 mL/hr over 30 Minutes Intravenous Every 12 hours 01/09/24 2306 01/09/24 2341   01/09/24 1330  piperacillin -tazobactam (ZOSYN ) IVPB 3.375 g        3.375 g 100 mL/hr over 30 Minutes Intravenous  Once 01/09/24 1329 01/09/24 1723   01/09/24 1030  ceFEPIme  (MAXIPIME ) 2 g in sodium chloride  0.9 % 100 mL IVPB        2 g 200 mL/hr over 30 Minutes Intravenous  Once 01/09/24 1028 01/09/24 1056   01/09/24 1030  metroNIDAZOLE  (FLAGYL ) IVPB 500 mg        500 mg 100 mL/hr over 60 Minutes Intravenous  Once 01/09/24 1028 01/09/24 1205   01/09/24 1030  vancomycin  (VANCOCIN ) IVPB 1000 mg/200 mL premix        1,000 mg 200 mL/hr over 60 Minutes Intravenous  Once 01/09/24 1028 01/09/24 1347       REVIEW OF SYSTEMS:  NA Objective:  VITALS:  BP 107/64   Pulse 81   Temp 98.2 F (36.8 C) (Oral)   Resp 18   Ht 5' 9 (1.753 m)   Wt 96.5 kg   SpO2 100%   BMI 31.41 kg/m  LDA HD catheter Rt subclavian  PHYSICAL EXAM:  General: Awake, confused, some agitation does not respond to questions appropriately, does not follow commands  head: Normocephalic, without obvious abnormality, atraumatic. Eyes: Conjunctivae clear, anicteric sclerae. Pupils are equal Cannot examine Neck: symmetrical,   Back: Did not examine Lungs: Bilateral air entry.  Decreased bases Heart: Irregular  abdomen: Soft, non-tender,not distended. Bowel  sounds normal. No masses Extremities: atraumatic, no cyanosis. No edema. No clubbing Skin: No rashes or lesions. Or bruising Lymph: Cervical, supraclavicular normal. Neurologic: Unable to assess Pertinent Labs Lab Results CBC    Component Value Date/Time   WBC 25.3 (H) 01/10/2024 0624   RBC 2.83 (L) 01/10/2024 0624   HGB 7.9 (L) 01/10/2024 0624   HCT 26.5 (L) 01/10/2024 0624   PLT 358 01/10/2024 0624   MCV 93.6 01/10/2024 0624   MCH 27.9 01/10/2024 0624   MCHC 29.8 (L) 01/10/2024 0624   RDW 15.7 (H) 01/10/2024 0624   LYMPHSABS 0.5 (L) 01/09/2024 1022   MONOABS 2.0 (H) 01/09/2024 1022   EOSABS 0.0 01/09/2024 1022   BASOSABS 0.1 01/09/2024 1022       Latest Ref Rng & Units 01/10/2024    6:24 AM 01/10/2024    1:14 AM 01/09/2024   10:22 AM  CMP  Glucose 70 - 99 mg/dL  827  888  BUN 8 - 23 mg/dL  38  41   Creatinine 9.38 - 1.24 mg/dL  8.88  8.53   Sodium 864 - 145 mmol/L  139  138   Potassium 3.5 - 5.1 mmol/L  4.4  4.4   Chloride 98 - 111 mmol/L  99  97   CO2 22 - 32 mmol/L  27  28   Calcium  8.9 - 10.3 mg/dL  8.4  8.4   Total Protein 6.5 - 8.1 g/dL 7.4   8.1   Total Bilirubin 0.0 - 1.2 mg/dL 0.2   0.3   Alkaline Phos 38 - 126 U/L 75   86   AST 15 - 41 U/L 21   31   ALT 0 - 44 U/L 20   28       Microbiology: Recent Results (from the past 240 hours)  Resp panel by RT-PCR (RSV, Flu A&B, Covid) Anterior Nasal Swab     Status: None   Collection Time: 01/09/24 10:22 AM   Specimen: Anterior Nasal Swab  Result Value Ref Range Status   SARS Coronavirus 2 by RT PCR NEGATIVE NEGATIVE Final    Comment: (NOTE) SARS-CoV-2 target nucleic acids are NOT DETECTED.  The SARS-CoV-2 RNA is generally detectable in upper respiratory specimens during the acute phase of infection. The lowest concentration of SARS-CoV-2 viral copies this assay can detect is 138 copies/mL. A negative result does not preclude SARS-Cov-2 infection and should not be used as the sole basis for treatment  or other patient management decisions. A negative result may occur with  improper specimen collection/handling, submission of specimen other than nasopharyngeal swab, presence of viral mutation(s) within the areas targeted by this assay, and inadequate number of viral copies(<138 copies/mL). A negative result must be combined with clinical observations, patient history, and epidemiological information. The expected result is Negative.  Fact Sheet for Patients:  BloggerCourse.com  Fact Sheet for Healthcare Providers:  SeriousBroker.it  This test is no t yet approved or cleared by the [place]  FDA and  has been authorized for detection and/or diagnosis of SARS-CoV-2 by FDA under an Emergency Use Authorization (EUA). This EUA will remain  in effect (meaning this test can be used) for the duration of the COVID-19 declaration under Section 564(b)(1) of the Act, 21 U.S.C.section 360bbb-3(b)(1), unless the authorization is terminated  or revoked sooner.       Influenza A by PCR NEGATIVE NEGATIVE Final   Influenza B by PCR NEGATIVE NEGATIVE Final    Comment: (NOTE) The Xpert Xpress SARS-CoV-2/FLU/RSV plus assay is intended as an aid in the diagnosis of influenza from Nasopharyngeal swab specimens and should not be used as a sole basis for treatment. Nasal washings and aspirates are unacceptable for Xpert Xpress SARS-CoV-2/FLU/RSV testing.  Fact Sheet for Patients: BloggerCourse.com  Fact Sheet for Healthcare Providers: SeriousBroker.it  This test is not yet approved or cleared by the [place]  FDA and has been authorized for detection and/or diagnosis of SARS-CoV-2 by FDA under an Emergency Use Authorization (EUA). This EUA will remain in effect (meaning this test can be used) for the duration of the COVID-19 declaration under Section 564(b)(1) of the Act, 21 U.S.C. section  360bbb-3(b)(1), unless the authorization is terminated or revoked.     Resp Syncytial Virus by PCR NEGATIVE NEGATIVE Final    Comment: (NOTE) Fact Sheet for Patients: BloggerCourse.com  Fact Sheet for Healthcare Providers: SeriousBroker.it  This test is not yet approved or cleared by the [place]  FDA and has  been authorized for detection and/or diagnosis of SARS-CoV-2 by FDA under an Emergency Use Authorization (EUA). This EUA will remain in effect (meaning this test can be used) for the duration of the COVID-19 declaration under Section 564(b)(1) of the Act, 21 U.S.C. section 360bbb-3(b)(1), unless the authorization is terminated or revoked.  Performed at [facility], [address] [place], [place] [address]   Blood culture (routine x 2)     Status: None (Preliminary result)   Collection Time: 01/09/24 10:25 AM   Specimen: BLOOD  Result Value Ref Range Status   Specimen Description BLOOD WRIST  Final   Special Requests   Final    BOTTLES DRAWN AEROBIC AND ANAEROBIC Blood Culture adequate volume   Culture   Final    NO GROWTH < 24 HOURS Performed at [facility], [address] [place], [place] [address]    Report Status PENDING  Incomplete  Blood culture (routine x 2)     Status: None (Preliminary result)   Collection Time: 01/09/24 10:59 AM   Specimen: BLOOD  Result Value Ref Range Status   Specimen Description BLOOD BLOOD RIGHT FOREARM  Final   Special Requests   Final    BOTTLES DRAWN AEROBIC AND ANAEROBIC Blood Culture adequate volume   Culture  Setup Time   Final    GRAM POSITIVE COCCI IN BOTH AEROBIC AND ANAEROBIC BOTTLES Organism ID to follow CRITICAL RESULT CALLED TO, READ BACK BY AND VERIFIED WITH: JASON ROBBINS, PHARMD @2316  01/09/2024 COP Performed at [facility], [address] [place], [place] [address]    Culture GRAM POSITIVE COCCI  Final   Report Status PENDING   Incomplete  Blood Culture ID Panel (Reflexed)     Status: Abnormal   Collection Time: 01/09/24 10:59 AM  Result Value Ref Range Status   Enterococcus faecalis NOT DETECTED NOT DETECTED Final   Enterococcus Faecium DETECTED (A) NOT DETECTED Final    Comment: CRITICAL RESULT CALLED TO, READ BACK BY AND VERIFIED WITH: JASON ROBBINS, PHARMD @2316  01/09/2024 COP    Listeria monocytogenes NOT DETECTED NOT DETECTED Final   Staphylococcus species NOT DETECTED NOT DETECTED Final   Staphylococcus aureus (BCID) NOT DETECTED NOT DETECTED Final   Staphylococcus epidermidis NOT DETECTED NOT DETECTED Final   Staphylococcus lugdunensis NOT DETECTED NOT DETECTED Final   Streptococcus species NOT DETECTED NOT DETECTED Final   Streptococcus agalactiae NOT DETECTED NOT DETECTED Final   Streptococcus pneumoniae NOT DETECTED NOT DETECTED Final   Streptococcus pyogenes NOT DETECTED NOT DETECTED Final   A.calcoaceticus-baumannii NOT DETECTED NOT DETECTED Final   Bacteroides fragilis NOT DETECTED NOT DETECTED Final   Enterobacterales NOT DETECTED NOT DETECTED Final   Enterobacter cloacae complex NOT DETECTED NOT DETECTED Final   Escherichia coli NOT DETECTED NOT DETECTED Final   Klebsiella aerogenes NOT DETECTED NOT DETECTED Final   Klebsiella oxytoca NOT DETECTED NOT DETECTED Final   Klebsiella pneumoniae NOT DETECTED NOT DETECTED Final   Proteus species NOT DETECTED NOT DETECTED Final   Salmonella species NOT DETECTED NOT DETECTED Final   Serratia marcescens NOT DETECTED NOT DETECTED Final   Haemophilus influenzae NOT DETECTED NOT DETECTED Final   Neisseria meningitidis NOT DETECTED NOT DETECTED Final   Pseudomonas aeruginosa NOT DETECTED NOT DETECTED Final   Stenotrophomonas maltophilia NOT DETECTED NOT DETECTED Final   Candida albicans NOT DETECTED NOT DETECTED Final   Candida auris NOT DETECTED NOT DETECTED Final   Candida glabrata NOT DETECTED NOT DETECTED Final   Candida krusei NOT  DETECTED NOT  DETECTED Final   Candida parapsilosis NOT DETECTED NOT DETECTED Final   Candida tropicalis NOT DETECTED NOT DETECTED Final   Cryptococcus neoformans/gattii NOT DETECTED NOT DETECTED Final   Vancomycin  resistance DETECTED (A) NOT DETECTED Final    Comment: CRITICAL RESULT CALLED TO, READ BACK BY AND VERIFIED WITH: SELINDA SIMPERS, PHARMD @2316  01/09/2024 COP Performed at [facility], [address] [place], [place] [address]   MRSA Next Gen by PCR, Nasal     Status: None   Collection Time: 01/10/24  1:14 AM   Specimen: Nasal Mucosa; Nasal Swab  Result Value Ref Range Status   MRSA by PCR Next Gen NOT DETECTED NOT DETECTED Final    Comment: (NOTE) The GeneXpert MRSA Assay (FDA approved for NASAL specimens only), is one component of a comprehensive MRSA colonization surveillance program. It is not intended to diagnose MRSA infection nor to guide or monitor treatment for MRSA infections. Test performance is not FDA approved in patients less than 33 years old. Performed at [facility], [address] [place], [place] [address]       IMAGING RESULTS: 10.8X3.9cmX9.4 cm rim enhancing fluid collections in the left retroperitoneal space irregular bladder wall thickening at the base near the urethra Aug 14  I have personally reviewed the films ?July 9 ? ? ? Impression/recommendation Septic shock secondary to Enterococcus faecium infection  Enterococcus faecium bacteremia -vancomycin -resistant Enterococcus faecium  Patient currently on IV linezolid .  Vanco and Zosyn  discontinued The source is very likely due to left retroperitoneal space abscess He needs an drain there He needs 2D echo/TEE  Leukocytosis secondary to an.  There could be an ill reactive element from the bladder carcinoma . AKI.  Creatinine of 1.46  Was on dialysis in June /July and it was stopped at [facility].  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    Note:  This document was prepared using Dragon voice recognition software and may include unintentional dictation errors.  Addendum.  Patient left AMA.  Was signed out by his granddaughter and was taken to [facility]

## 2024-01-10 NOTE — ED Notes (Signed)
 New bed alarm applied to bed a this time

## 2024-01-10 NOTE — ED Notes (Signed)
 Pt cleaned up. New brief placed. Fresh blankets given. Mittens placed on patient for safety purposes r/t pt trying to pull at cords and wires.

## 2024-01-10 NOTE — ED Notes (Addendum)
 [facility] arrived to transport patient to different facility. Family arranged [facility] transport and are paying for transport themselves.

## 2024-01-10 NOTE — ED Notes (Signed)
 Pt and family insistent on leaving AMA and going to [place]. Risks reviewed with granddaughter and patient. AMA form signed via telephone with NP.

## 2024-01-10 NOTE — Progress Notes (Signed)
 Initial Nutrition Assessment  DOCUMENTATION CODES:   Obesity unspecified  INTERVENTION:   -TF via g-tube:   237 ml Osmolite 1.5 6 times daily   50 ml free water  flush before and after each feeding administration   Tube feeding regimen provides 2130 kcal (100% of needs), 89 grams of protein, and 1086 ml of H2O. Total free water : 1686 ml daily   NUTRITION DIAGNOSIS:   Inadequate oral intake related to inability to eat, dysphagia as evidenced by NPO status.  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   Reviewed I/O's: -1.6 L x 24 hours   UOP: 2 L x 24 hours  Case discussed MD and during ICU rounds. Pt holding in the ED and family desires transfer to [facility].   Pt is familiar to this RD due to prior prolonged hospital admission earlier this year. Pt is NPO and receives sole source nutrition via PEG tube secondary to achalasia. Suspect long term enteral support required secondary to NPO status and achalasia. Noted pt was diagnosed with bladder cancer during admission at [facility] on 7/21-01/04/24. He has been transitioned off HD and catheter was removed 3 weeks ago PTA.   Per notes, pt is desiring transfer to [facility], however, [facility] declined transfer to due to lack of beds. MD recommendation intubation to protect airways (pt hypotensive and obtunded), however, family declined intubation.   Per MD notes, ID recommending IR consult for evluation of drainage collection; family prefers this to be done at [facility] and hold off for now.   Per [facility] RD notes,  noted PTA TF regimen of 240 ml Nutren 1.5 6 times daily, which provides 2160 kcals,98 grams protein, and 110 ml free water  daily. Nutren 1.5 is not available on hospital formulary, so will provide formulary substitution.   Reviewed wt hx; pt has experienced a 10.6% wt loss over the past 4 months, which is significant for time frame. Highly suspect pt with malnutrition, however, unable identify at this time. Pt with prior history of malnutrition with significant wt loss since prior admission.   Medications reviewed and include lovenox   Labs reviewed: K, Mg, and Phos WDL. CBGS: 93-109 (inpatient orders for glycemic control are none).    Diet Order:   Diet Order             Diet NPO time specified  Diet effective now                   EDUCATION NEEDS:   No education needs have been identified at this time  Skin:  Skin Assessment: Reviewed RN Assessment  Last BM:  Unknown  Height:   Ht Readings from Last 1 Encounters:  01/09/24 5' 9 (1.753 m)    Weight:   Wt Readings from Last 1 Encounters:  01/09/24 96.5 kg    Ideal Body Weight:  72.7 kg  BMI:  Body mass index is 31.41 kg/m.  Estimated Nutritional Needs:   Kcal:  1800-2000  Protein:  90-105 grams  Fluid:  1.8-2.0 L    Margery ORN, RD, LDN, CDCES Registered Dietitian  III Certified Diabetes Care and Education Specialist If unable to reach this RD, please use RD Inpatient group chat on secure chat between hours of 8am-4 pm daily

## 2024-01-10 NOTE — ED Notes (Signed)
 Morning blood work obtained. Sent to lab. Pt resting at this time. Stretcher in lowest position with wheels locked. Side rails up x 2.

## 2024-01-10 NOTE — Discharge Summary (Signed)
 Physician Discharge Summary  Patient ID: SHAWNDELL Vega MRN: [id number] DOB/AGE: [birth year] 83 y.o.  Admit date: 01/09/2024 Discharge date: 01/10/2024   Pt's family/POA [name] Galantine has elected to have patient leave AMA and independently transport patient to [facility] as that is where he receives all of his care.  Attempt was made previously to try and initiate transfer to [facility], unfortunately, they are at capacity and unable to accepts transfers.  Pt is still currently being treated and not cleared/ready for discharge,  thus family has waited until patient is optimized and stable enough (off vasopressors and fluids) to independently transfer.  They understand the possible risks (decompensation and even death) and are willing to proceed.  Brief Pt Description / Synopsis:  83 y.o. 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 Mild Acute on Chronic HFpEF Acute Metabolic Encephalopathy High risk for Aspiration secondary to Achalasia status post PEG tube placement Hypoalbuminemia  Chronic Anemia                                                            Discharge Summary:  83 year old male presenting to [facility] ED from [facility] via EMS for evaluation of shortness of breath.   History obtained per chart review and family bedside including telephone discussion with [facility] [name] report patient is unable to participate in interview at this time. Staff at [facility] noted shortness of breath and altered mental status starting the morning of 01/09/2024.  Patient was found hypoxic at 86% on his chronic 3 L nasal cannula and  was more confused than his baseline. Family reported that he was at his baseline on 01/08/24, intermittently confused but conversational.   Patient has extensive medical history and is followed primarily at [facility].  He has had prolonged hospital stays since May 2025. From 10/05/23-12/16/23 he was at [facility] for treatment of sepsis s/t aspiration pneumonia in the setting of achalasia s/p PEG tube. Stay complicated by a encephalopathy, intubation, PEG tube dislodgement requiring ex-lap, wash out and PEG replacement, AKI which progressed to ESRD requiring iHD treatments. He was signed out AMA and driven by transport service to [facility] ED for evaluation. There he was admitted from 12/16/23-01/04/24 and treated for bilateral hydroureteronephrosis, forniceal rupture & bilateral urinomas who underwent cystocopy. 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 3 weeks ago he was transitioned off hemodialysis and his catheter was removed. He was discharged to [facility] while awaiting an outpatient drainage procedure for his left urinoma. Per his granddaughter this was cancelled due to noted improvement in his blood work while on Zosyn  & meropenem.    Initially, family was attempting to transfer the patient to [facility] for treatment- see previous progress note.   ED course: Upon arrival the patient was altered, febrile, tachypneic, tachycardic with soft blood pressures.  Sepsis protocol initiated and empiric antibiotics.  Family attempted to transfer patient to [facility] but was declined due to capacity.  They  then attempted to transport patient via private service but due to vasopressor requirements were unable to transport him safely.  Labs significant for hypochloremia, AKI, hypoalbuminemia, leukocytosis, elevated PCT & BNP.  Imaging revealed rim-enhancing fluid collection in the left retroperitoneal space  corresponding with previous hematoma/urinoma, concerning for superinfection. Medications given: Tylenol , cefepime /Flagyl /vancomycin / Zosyn  3 L IV fluid bolus, IV contrast and Levophed  drip started Initial Vitals: 103.2, 37, 120, 95/50 and 100% on 6 L nasal cannula Significant labs: (Labs/ Imaging personally reviewed) I, Jenita Ruth Rust-Chester, AGACNP-BC, personally viewed and interpreted this ECG. EKG Interpretation: Date: 01/09/2024, EKG Time: 10:17, Rate: 110, Rhythm: ST, QRS Axis: Normal, Intervals: Normal, ST/T Wave abnormalities: None, Narrative Interpretation: Sinus tachycardia Chemistry: Na+: 138, K+: 4.4, Cl: 97, BUN/Cr.:  41/1.46, Serum CO2/ AG: 28/13, albumin : 2.1 Hematology: WBC: 23.6, Hgb: 9.5,  Troponin: 17, BNP: 223.2, Lactic/ PCT: 1.6/3.80, COVID-19 & Influenza A/B: Negative   UA: +LEUKS, > 50 WBC's VBG: 7.34/61/32 >> 7.32/64/55/33   CXR 01/09/24: Cardiomegaly, vascular congestion. CT head wo contrast 01/09/24: no acute intracranial abnormality. Atrophy with chronic small vessel ischemic disease. CT angio chest PE 01/09/24: No definite evidence of pulmonary embolus. Moderately dilated and fluid-filled esophagus is noted concerning for distal esophageal obstruction such as neoplasm. Endoscopy is recommended for further evaluation. Minimal left pleural effusion with minimal bibasilar subsegmental atelectasis. 6 mm subpleural nodule seen in right middle lobe. Non-contrast chest CT at 6-12 months is recommended. CT abdomen/ pelvis w contrast 01/09/24: 10.8 x 3.9 x 9.4 cm rim enhancing fluid collection in the left retroperitoneal space, corresponding to the hematoma seen on the 12/04/2023 exam. A projection of this rim enhancing collection extends anterior to the ureter. Imaging features are compatible with an abscess, likely superinfection of the pre-existing hematoma. 2. Mild to moderate bilateral hydroureteronephrosis with bilateral double-J internal ureteral stents in situ, as  described. 3. The balloon of the Foley catheter is positioned in the posterior penile urethra. 4. Gas in the bladder lumen may be related to instrumentation or infection. 5. Irregular bladder wall thickening at the base near the urethra. 6. Cholelithiasis. 7. 16 mm well-defined homogeneous low-density lesion in the body of the pancreas approaches water  attenuation is likely a cyst. This is stable over multiple prior studies back to 11/02/2023 but was not well seen on noncontrast CT imaging of 10/21/2023 and 10/22/2023. This is probably a benign lesions such as pseudocyst or side branch IPMN. Follow-up MRI of the abdomen with and without contrast recommended after resolution of acute symptoms when patient is better able to participate with breath holding and positioning. 8. Patulous distal esophagus is fluid-filled, likely related to reflux. 9.  Aortic Atherosclerosis   PCCM consulted for admission due to septic shock s/t bacteremia and infected urinoma on vasopressor support.  Please see Significant Hospital Events section below for full detailed hospital course.  Significant Hospital Events: Including procedures, antibiotic start and stop dates in addition to other pertinent events   01/09/24: Admit to ICU with septic shock secondary to bacteremia and infected urinoma on vasopressor support.  High risk for intubation.  01/10/24: Weaned off Levophed , mental status slowly improving, AKI improving.  Family plans to take pt to [facility] independently against medical advice.    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  Blood cultures positive for enterococcus faecium, vancomycin  resistant.  Discussed with  pharmacy-Will cover with Zyvox  - Supplemental oxygen as needed, to maintain SpO2 > 90% - f/u cultures, trend lactic/ PCT > stat lactic & VBG - Daily CBC, monitor WBC/ fever curve - IV antibiotics: Zyvox  - IVF hydration as needed, albumin  supplementation - Continue peripheral vasopressors to maintain MAP< 65: norepinephrine  - hold outpatient diltiazem  d/t hypotension - ID consultation, consider IR consultation for drainage of fluid collection   #Acute Kidney Injury in the setting of septic shock Most recent Cr 8/11: 0.8, Cr on admission:1.46 - Strict I/O's: alert provider if UOP < 0.5 mL/kg/hr - gentle IVF hydration  - Daily BMP, replace electrolytes PRN - Avoid nephrotoxic agents as able, ensure adequate renal perfusion - Consider Nephrology consultation if renal function doesn't quickly improve, as patient recently required iHD for AKI    #Mild Acute on Chronic HFpEF  - Continuous cardiac monitoring  - Daily weights to assess volume status - Diurese with the use of IV lasix  as hemodynamics and renal function allow - Supplemental oxygen as needed, maintain SpO2 > 90%   #Acute on Chronic Encephalopathy Patient developed delirium after prolonged hospitalization and still has intermittent confusion, but is conversational at baseline per family. - supportive care - delirium precautions, avoid sedating medications at this time until mentation is closer to baseline   #Aspiration risk s/t Achalasia s/p PEG tube placement #Hypoalbuminemia #Malnutrition Patient, even though more responsive is still a HIGH risk for aspiration and intubation, family understands situation - supplemental O2 PRN to maintain SpO2 > 90% - aspiration precautions - albumin  supplementation - dietary consultation for TF > family reported difficulty managing nutrition due to hyperkalemia, looking up name of feeding supplement they would recommend   Chronic Anemia- multifactorial - Monitor for s/s of bleeding -  Daily CBC - Transfuse for Hgb <7   Significant Diagnostic Studies:  CXR 01/09/24: Cardiomegaly, vascular congestion. CT head wo contrast 01/09/24: no acute intracranial abnormality. Atrophy with chronic small vessel ischemic disease. CT angio chest PE 01/09/24: No definite evidence of pulmonary embolus. Moderately dilated and fluid-filled esophagus is noted concerning for distal esophageal obstruction such as neoplasm. Endoscopy is recommended for further evaluation. Minimal left pleural effusion with minimal bibasilar subsegmental atelectasis. 6 mm subpleural nodule seen in right middle lobe. Non-contrast chest CT at 6-12 months is recommended. CT abdomen/ pelvis w contrast 01/09/24: 10.8 x 3.9 x 9.4 cm rim enhancing fluid collection in the left retroperitoneal space, corresponding to the hematoma seen on the 12/04/2023 exam. A projection of this rim enhancing collection extends anterior to the ureter. Imaging features are compatible with an abscess, likely superinfection of the pre-existing hematoma. 2. Mild to moderate bilateral hydroureteronephrosis with bilateral double-J internal ureteral stents in situ, as described. 3. The balloon of the Foley catheter is positioned in the posterior penile urethra. 4. Gas in the bladder lumen may be related to instrumentation or infection. 5. Irregular bladder wall thickening at the base near the urethra. 6. Cholelithiasis. 7. 16 mm well-defined homogeneous low-density lesion in the body of the pancreas approaches water  attenuation is likely a cyst. This is stable over multiple prior studies back to 11/02/2023 but was not well seen on noncontrast CT imaging of 10/21/2023 and 10/22/2023. This is probably a benign lesions such as pseudocyst or side branch IPMN. Follow-up MRI of the abdomen with and without contrast recommended after resolution of acute symptoms when patient is better able to participate with breath holding  and positioning. 8. Patulous distal esophagus  is fluid-filled, likely related to reflux. 9.  Aortic Atherosclerosis            Micro Data:  8/14: COVID/FLU/RSV PCR>> negative 8/14: Blood cultures>>Enterococcus Faecium (Vancomycin  resistant) 8/14: Urine>> 8/14: MRSA PCR>>negative 8/14: Group A Strep throat culture>>  Antimicrobials:   Anti-infectives (From admission, onward)    Start     Dose/Rate Route Frequency Ordered Stop   01/09/24 2345  linezolid  (ZYVOX ) IVPB 600 mg        600 mg 300 mL/hr over 60 Minutes Intravenous Every 12 hours 01/09/24 2342     01/09/24 2315  meropenem (MERREM) 1 g in sodium chloride  0.9 % 100 mL IVPB  Status:  Discontinued        1 g 200 mL/hr over 30 Minutes Intravenous Every 12 hours 01/09/24 2306 01/09/24 2341   01/09/24 1330  piperacillin -tazobactam (ZOSYN ) IVPB 3.375 g        3.375 g 100 mL/hr over 30 Minutes Intravenous  Once 01/09/24 1329 01/09/24 1723   01/09/24 1030  ceFEPIme  (MAXIPIME ) 2 g in sodium chloride  0.9 % 100 mL IVPB        2 g 200 mL/hr over 30 Minutes Intravenous  Once 01/09/24 1028 01/09/24 1056   01/09/24 1030  metroNIDAZOLE  (FLAGYL ) IVPB 500 mg        500 mg 100 mL/hr over 60 Minutes Intravenous  Once 01/09/24 1028 01/09/24 1205   01/09/24 1030  vancomycin  (VANCOCIN ) IVPB 1000 mg/200 mL premix        1,000 mg 200 mL/hr over 60 Minutes Intravenous  Once 01/09/24 1028 01/09/24 1347        Consults:  PCCM  Infectious Disease     Vitals:   01/10/24 1130 01/10/24 1145 01/10/24 1200 01/10/24 1242  BP: (!) 89/72 (!) 107/54 (!) 97/58 (!) 104/59  Pulse: 77 74 67 72  Resp: 17 19 18 19   Temp:      TempSrc:      SpO2: 100% 100% 100% 100%  Weight:      Height:         Discharge Labs:   BMET Recent Labs  Lab 01/09/24 1022 01/10/24 0114 01/10/24 0624  NA 138 139  --   K 4.4 4.4  --   CL 97* 99  --   CO2 28 27  --   GLUCOSE 111* 172*  --   BUN 41* 38*  --   CREATININE 1.46* 1.11  --   CALCIUM  8.4* 8.4*  --   MG  --   --  2.1  PHOS  --   --  3.5     CBC Recent Labs  Lab 01/09/24 1022 01/10/24 0624  HGB 9.5* 7.9*  HCT 30.3* 26.5*  WBC 23.6* 25.3*  PLT 473* 358    Anti-Coagulation No results for input(s): INR in the last 168 hours.              Disposition: LEAVING AMA    Signed: Inge Lecher, AGACNP-BC [facility] Prefer epic messenger for cross cover needs If after hours, please call E-link

## 2024-01-10 NOTE — IPAL (Signed)
  Interdisciplinary Goals of Care Family Meeting   Date carried out: 01/10/2024  Location of the meeting: Phone conference  Member's involved: Nurse Practitioner and Family Member or next of kin  Durable Power of Attorney or acting medical decision maker: [name] [place])  Discussion: We discussed goals of care for Robert Vega .  Called to update [name] regarding improvement in clinical status: pt more awake and interactive, shock is resolving (Levophed  being turned off currently), AKI improving.  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  Have consulted ID, and if transfer is going to be prolonged, really need to consider IR consult for evaluation of drainage of fluid collection.  [name] reports that she really would prefer that the drainage be done at [facility], and would like to hold off on IR consult for now.  Once he is able to maintain off vasopressors/fluids, she will call again to arrange transport herself to [facility].  Will follow up with her once pt is able to maintain off Levophed  for 2-3 hours (have started Midodrine  down the G-tube in the interim).  She is very appreciative of update.   Code status:   Code Status: Full Code   Disposition: Continue current acute care  Time spent for the meeting: 10 minutes   Inge Lecher, AGACNP-BC [facility] Prefer epic messenger for cross cover needs If after hours, please call E-link  Inge JONETTA Lecher, NP  01/10/2024, 10:25 AM

## 2024-01-11 LAB — URINE CULTURE: Culture: >=100000 — AB

## 2024-01-12 LAB — CULTURE, BLOOD (ROUTINE X 2)
Special Requests: ADEQUATE
Special Requests: ADEQUATE

## 2024-01-12 NOTE — Progress Notes (Signed)
 Infectious Diseases Consultation Note  Date of Admission: 01/10/2024 Service Requesting: Internal Medicine Hospital Day: 3 with principal problem of <principal problem not specified>  Identifying Info:  Robert Vega is 83 y.o. male with medical problems significant for recent prolonged adm for aspiration pneumonia complicated by renal failure, transient hemodialysis, complications from PEG, diagnosis of urothelial carcinoma of bladder, and bilateral urinomas treated with ureteral stents who was admitted on 01/10/2024. ID is consulted for sepsis.    Subjective  HPI/Interval Updates:  He has a complicated recent history that is summarized below.  He was adm from 5/10 to 7/21 at [facility] after presenting with resp failure thought to be due to aspiration.  His hospital stay was complicated by transient dialysis for renal failure and a dislodged PEG that required exploratory laparotomy.  He was transferred to [facility] 7/21 and found to have bilateral perirenal collections that were thought to be urinomas due to forniceal rupture (contrast was in the retroperitoneal fat).  These were treated with placement of bilateral ureteral stents and during this procedure he was noted to have a bladder tumor which was biopsied showing invasive urothelial carcinoma.  He was off/on abx with no positive cultures at [facility] (had Enterococcus faecalis in a urine culture in June at [place]) and eventually was discharged to a SNF on 8/11 with plans to address the urinomas as an outpatient.  He was seen in the [facility] ER 8/14 with some behavioral change, fevers, hypotension and shortness of breath.  He was transiently on pressors, was covered with vanco/cefepime /flagyl , and had a repeat CT that showed rim-enhancement of his L retroperitoneal collection concerning for superinfected urinoma.  A blood culture at [place] is growing Enterococcus faecium with PCR showing vanco resistance (only a single blood culture appears to have been  sent), and urine culture is pending.  On adm here he was started on linezolid  and Zosyn .    Today, he has had some diarrhea and remains delirious.  He has been afebrile and his WBC is better.  Repeat blood cultures here have been negative.   On exam he is awake but delirious and disoriented.  Can only follow some simple commands, talking gibberish, and a bit agitated.  Cannot give any history.     Events: 5/10/25Adm [place] with aspiration pneumonia, intubated 5/13 PEG insertion 5/14 Extubated 5/25 Pulled his own PEG 5/27 OR for exlap for dislodged PEG 6/16 Started on hemodialysis 6/26 PermCath inserted 7/21 Adm to [facility], CT with bilateral hydronephrosis and peri-renal collections consistent with urinomas 7/29 OR for TURBT and placement of bilateral ureteral stents, path with invasive urothelial carcinoma 8/11 D/c to SNF 8/14  ER eval for increased confusion and sob, CT with rim-enhancing collection L retroperitoneal space concerning for infection, transiently on levophed , vanco/cefepime  given 8/15 Transferred to [facility]  Antimicrobials: Fever Curve and Antibiotics (Current Encounter) <redacted file path> Linezolid  8/15- Zosyn  8/15- Vanco 8/14 Cefepime  8/14 Metronidazole  8/14  Additional data (medical, surgical, family, social history, and med list) reviewed.  Objective  Physical Exam: Vitals:   01/12/24 1604  BP: 129/70  Pulse: 89  Resp: (!) 39  Temp: 36.3 C (97.4 F)   Temp (24hrs), Avg:36.4 C (97.5 F), Min:35.7 C (96.2 F), Max:36.9 C (98.4 F)   Peripheral IV 01/10/24 Right Basilic;Forearm (2) Peripheral IV 01/10/24 Distal;Left Basilic (2) Peripheral IV 01/10/24 Left Antecubital (2) Urethral Catheter (15) General: alert, mildly agitated, can only follow some simple commands, talking gibberish Psych: as above Eyes: conjunctiva clear Neck: supple and no adenopathy  HENT: oropharynx clear, moist mucous membranes, he doesn't keep his mouth open very long but  appears to have a small spot of thrush on post soft palate, no other obvious lesions noted Chest/Cardiovascular: regular rate and rhythm, without murmurs, rubs or gallops Respiratory: good air exchange, few scattered crackles, no clear dullness GI/Abdomen: soft, nontender, nondistended, normoactive bowel sounds , without hepatosplenomegaly, G-tube site ok with no redness or drainage from around the tube Back: full range of motion, no tenderness, palpable spasm or pain on motion Extremities: no lower extremity edema, no redness or swelling over his joints, no obvious cords, no breakdown over his heels Skin: no rashes or lesions Neurologic: unable to comply with exam but moving all extremities, no gross CN deficits   Microbiology <redacted file path>: Blood/Urine/Resp/Tissue Cx:  Lab Results  Component Value Date   BLDCULT No growth at 48 hours 01/10/2024   BLDCULT No growth at 48 hours 01/10/2024   BLDCULT No growth detected. 01/02/2024   BLDCULT No growth detected. 01/02/2024   BLDCULT No growth detected. 12/16/2023   BLDCULT No growth detected. 12/16/2023  8/14 blood culture X 1 set at [place] with PCR showing vanco-resistant Enterococcus faecium Lab Results  Component Value Date   URCULT  01/10/2024    10,000-50,000 CFU/mL Non-beta hemolytic Gram positive Organism   URCULT No growth 12/17/2023  No results found for: CULTRESPNo results found for: TISSUEC  Culture Data w/ Susceptibilities (1 year) <redacted file path>  Laboratory Studies <redacted file path>: Lab Results  Component Value Date   WBC 11.9 (H) 01/12/2024   HGB 7.7 (L) 01/12/2024   PLT 323 01/12/2024   Lab Results  Component Value Date   NA 136 01/12/2024   K 3.9 01/12/2024   CL 99 01/12/2024   CO2 27 01/12/2024   CO2 27 01/11/2024   BUN 16 01/12/2024   CREATININE 0.8 01/12/2024   CALCIUM  8.1 (L) 01/12/2024   ALKPHOS 84 01/10/2024   ALT 23 01/10/2024   AST 23 01/10/2024   GLUCOSE 90 01/12/2024    Estimated Creatinine Clearance: 61.8 mL/min (based on SCr of 0.8 mg/dL). - Adjusted Ideal Body Weight CrCl Range (Ideal BW, Actual BW): (68.8, 90.1) ml/min     EKG (QTc): Lab Results  Component Value Date   QTC 444 01/10/2024   QTC 408 12/16/2023   Radiology <redacted file path>: Relevant data (if any) personally reviewed in [place]. Independent interpretation:  CT chest/abd/pelvis with contrast 8/15- 1.  Minimal decrease in size of perinephric fluid collections. 2.  Bladder wall thickening, similar. 3.  Patulous, debris filled esophagus places patient at risk of aspiration. Pathology <redacted file path>: Relevant data (if any) personally reviewed in [place].  Each new and available test documented above been reviewed at time of consult/follow-up. Assessment & Plan   Assessment:  He presented to [place] with fevers and increased confusion and was found to have a blood culture growing vanco-resistant Enterococcus faecium.  Repeat blood cultures here are pending.  He is currently on linezolid  and Zosyn .  He is hemodynamically stable and hasn't been febrile since transfer.  Prev blood cultures here have been negative so it appears that this bacteremia is a new event.  We only have a single blood culture that was positive, but in considering his presentation with sepsis it seems reasonable to assume that this represents true infection rather than a contaminant.  The most likely sources for VRE are IV lines or the GI/GU tracts.  He didn't have any indwelling IV catheters,  and no obvious abscesses or RUQ issues on CT, leaving a urinary source as the most likely.  It is possible that this is a complicated UTI, but also possible that he has infection of his pre-existing urinomas.  It may be that he had these seeded sometime recently and the infection has progressed.  To rule out infection of the urinomas will require percutaneous aspiration of the collections for culture.  If they are infected then  we will be able to treat with perc drainage and antibiotics.  If the urinomas don't appear to be infected then will assume that he had a complicated UTI (there is no obvious ongoing leak from the kidneys into the urinomas).  I don't think that his lungs were the source despite his prior history of aspiration pneumonia since his lungs sound reasonably clear and his CT didn't show any large infiltrates.  He doesn't appear to have any metastatic sites of infection on exam, and VRE has a much lower likelihood of causing endocarditis as compared to Enterococcus faecalis.  Prior echocardiogram in April 2025 didn't show any significant valvular abnormalities.  Repeat EKG on adm with no new concerning conduction changes.  I would wait on the repeat blood cultures, and if these remain negative then no need for repeat echo.  OK to continue both Zosyn  and linezolid  for now pending final cultures.    Recommendations: Continue linezolid  and Zosyn  for now pending repeat cultures Would pursue percutaneous aspiration of his urinomas to eval for infection given the bacteremia No need for TTE at this time unless repeat cultures persistently positive  ID will continue to follow Probable need for outpatient ID f/u: Unclear at this time  Please page ID General Team 2 [phone]) with questions or change in clinical status.  Infectious Diseases is available in-house from 8A-5P. If urgent assistance is needed outside of these hours, please page the Infectious Diseases Team Pager assigned to the patient for assistance. The Infectious Diseases consult pagers are available after business hours for emergencies only.  Any IV antimicrobials listed above require regular monitoring for toxicity. Recommendations as documented above and interpretation of above test results discussed with primary service.  Arley CHRISTELLA Free, MD

## 2024-01-12 NOTE — Progress Notes (Signed)
 Progress Note   Hospital Day: 3 with principal problem of <principal problem not specified>  Subjective:   Slightly drowsy this morning but able to wake up. Had received Seroquel  12.5mg  around 0402 this morning for agitation. He remains confused.  Reportedly had a bout of melena last night. I discussed his case with gastroenterology who  reviewed the photo in the media tab and agreed that it does not look like melena. Given the reported h/o arrest during his EGD at [facility] in May 2025, they are not inclined to perform any endoscopies unless there is a clear indication and/or benefit from it. Recommended monitoring for any signs of a true GI bleed.   ROS   Objective:   Vital signs in last 24 hours:  Vitals:   01/12/24 0310 01/12/24 0400 01/12/24 0756 01/12/24 1158  Temp:   (!) 35.7 C (96.2 F) 36.5 C (97.7 F)  TempSrc:   Axillary Axillary  Pulse:  80 88 78  Heart Rate Source:   Monitor Monitor  Pulse:  80 88 78  Resp:  (!) 31 (!) 29 25  BP: (!) 148/79  126/70 114/65  MAP (mmHg): 95  86 79  BP Location: Left upper arm  Left upper arm Left upper arm  BP Method: Automatic  Automatic Automatic  Patient Position: Lying  Lying Lying     Physical Exam: General: Awake and alert. Not in distress.  HEENT:  Atraumatic.  Neck: supple, normal range of motion, no JVD Cardiovascular: Regular rate and rhythm. No murmurs, rubs or gallops  Respiratory Lungs clear to auscultation, no wheezes/rales/rhonchi or crackles Abdomen/GI: Soft, non distended. Non tender. Bowel sounds audible.  Extremities No leg edema.  Neurological: Oriented x 0. Confused  Psych: normal mood, pleasant affect, appropriate tone Genitourinary:Foley in place with yellow urine   Pertinent Recent Labs: Labs in chart were reviewed. Lab Results  Component Value Date   WBC 11.9 (H) 01/12/2024   HGB 7.7 (L) 01/12/2024   HCT 24.3 (L) 01/12/2024   PLT 323 01/12/2024   Lab Results  Component Value Date   NA 136  01/12/2024   K 3.9 01/12/2024   CL 99 01/12/2024   CO2 27 01/12/2024   BUN 16 01/12/2024   CREATININE 0.8 01/12/2024   GLUCOSE 90 01/12/2024   Lab Results  Component Value Date   CALCIUM  8.1 (L) 01/12/2024   MG 1.6 (L) 01/12/2024   PHOS 3.0 01/11/2024   Lab Results  Component Value Date   AST 23 01/10/2024   ALT 23 01/10/2024   ALKPHOS 84 01/10/2024   TBILI 0.5 01/10/2024   CONJBILI 0.1 12/31/2023   ALB 1.8 (L) 01/11/2024   TOTALPROTEIN 7.1 01/10/2024   Lab Results  Component Value Date   APTT 28.5 01/11/2024   INR 1.0 01/11/2024   Lab Results  Component Value Date   COLORU Yellow 01/10/2024   CLARITYU Clear 01/10/2024   SPECGRAV 1.020 01/10/2024   GLUCOSEU Negative 01/10/2024   KETONESU Negative 01/10/2024   BLOODU 3+ (!) 01/10/2024   NITRITE Negative 01/10/2024   LEUKOCYTESUR 3+ (!) 01/10/2024   BILIRUBINUR Negative 01/10/2024   UROBILINOGEN 0.2 01/10/2024   RBCUA >182 (H) 01/10/2024   WBCUA >182 (H) 01/10/2024   SQUAMEPI 0 01/10/2024   No results found for: CK, TROPONINT, TROPONINI, BNP No results found for: HGBA1C No results found for: TSH  Scheduled Meds: . aspirin   81 mg G Tube Daily  . caffeine  100 mg G Tube Daily before breakfast  .  calcium  carbonate-vitamin D3  1 tablet G Tube Daily  . cholecalciferol  1,000 Units G Tube Daily  . cyanocobalamin   100 mcg G Tube Daily  . [Held by provider] enoxaparin   40 mg Subcutaneous Daily  . ferrous fumarate  324 mg G Tube Daily with breakfast  . folic acid   1 mg G Tube Daily  . linezolid  (ZYVOX ) IV  600 mg Intravenous Q12H SCH  . melatonin  3 mg G Tube QHS  . omeprazole  oral suspension 2 mg/mL  40 mg G Tube Daily  . pantoprazole  (PROTONIX ) IV  40 mg Intravenous Q12H SCH  . piperacillin -tazobactam (ZOSYN ) IV Extended Therapy  3.375 g Intravenous Q8H  . TORsemide  10 mg G Tube Every other day  . traZODone   25 mg G Tube QHS  . tube feeding   Via Tube Bolus 6 feeds/day (06, 09, 12, 15, 18, 21)    Continuous Infusions: PRN Meds:.acetaminophen , dextrose , dextrose  50% in water , docusate, glucagon, lidocaine , lidocaine   Assessment/Plan:   Active Problems:   * No active hospital problems. *   83 y.o. male with recently diagnosed muscle invasive urothelial cancer, b/l moderate to severe hydroureteronephrosis s/p J stent placement, encephalopathy/delirium, GERD, achalasia complicate by recurrent aspiration requiring Gtube placement (strict NPO), CKD (previously required iHD for AKI on CKD) from OSH ED with fever and AMS. Recently discharged from [facility] on 01/06/24 after a prolonged hospital stay which was a continuum of a hospitalization at [facility] from 10/05/23-12/16/23. Since 10/05/2023 he was at SNF/out of a hospital setting for 2-2.5 days only.   Sepsis, resolved  Suspected complicated UTIs and/or infected urinomas  VRE bacteremia  Recently discharged from [facility] (on 8/11) to SNF. Re-admitted to [facility] on 8/14 with fever and altered mental status (was altered even on discharge on 8/11 per notes) and hypotension/shock requiring pressor support. Blood cultures had grown VRE in one culture. U/A suggestive of UTI. Foley cathter changed on 8/14.  While he is at high risk for aspiration, his respiratory status is stable without evidence of pneumonia on imaging.  OSH imaging revealed known b/l urinomas with possible infectious component on the left side.  Grandaughter/HCPOA had requested transfer to [facility] but it was declined due to capacity and as there were no [facility] or quarternary hospital level of care needs. However family signed him out AMA and brought him to [facility] on 8/15.  Continue IV Linezolid  and Pip-Tazo per ID.  CT chest/abd/pelvis repeated on 8/15 shows improvement in size of urinomas.  Per discussion with urology, planning for interval imaging in 48-72 hours and if no improvement, plan for CT drainage. Will repeat CT tomorrow   - Blood cultures without  growth in 24 hours   - urine culture (with caveat that this appears to have been collected from his existing foley catheter)- non significant growth of beta hemolytic strep    Acute on chronic encephalopathy  Hypoactive/hyperactive delirium  Prior to hospitalization in may - reported as ambulatory by HCPOA. During prior admission he was delirious throughout with alternating hypo/hyperactive delirium, he was evaluated by geriatrics and ultimately improved with the following regimen Caffeine 100 mg QAM  Trazodone  25 mg 2PM on schedule   Melatonin at night   Hearing aid  Sitter at bedside   # Acute diarrhea  Reportedly had a bout of melena last night. I discussed his case with gastroenterology who  reviewed the photo in the media tab and agreed that it does not look like melena. Given  the reported h/o arrest during his EGD at [facility] in May 2025, they are not inclined to perform any endoscopies unless there is a clear indication and/or benefit from it. Recommended monitoring for any signs of a true GI bleed.  Will rule out c-diff    Muscle-invasive urothelial carcinoma B/l hydrourerteronephrosis with forniceal rupture and urinomas History of recent Aspiration/HAP, E faecalis UTI  Previous CT w/ b/l hydrourerteronephrosis with forniceal rupture, urinomas and enhancing lesion from the bladder base with path demonstrating urothelial carcinoma. Per prior med onc and urology, will need to regain functional status prior to any surgical or systemic therapy CT chest abd pelvis show persistent urinomas now decreased in size.  Urology following.  Recommended interval imaging in 48-72 hours and if no improvement consider CT drainage as above.     SVC DVT at prior dialysis line  Superficial clot in bilateral cephalic veins   - DVT PPX level of lovenox  given immobility per HCPOA preference     Anemia, multifactorial Has previously had significant hematuria requiring transfusions   - Iron, folic acid ,  vitamin B12, thiamine     Dysphagia Achalasia s/p PEG Malnutrition Hypoalbuminemia Was undergoing extended outpatient work-up At OSH, had botox  injection to LES on 5/20 Esophagram 5/21 showed diffusely distended esophagus with distal obstruction and severe dysmotility suggestive of achalasia  - Continue omeprazole   - continue TF as in prior admission: Nutren 1.5, goal volume of 240 mL q3h x 6 feeds/day from ([id number]).    HTN hold home diltiazem  given soft BP and intermittent hypotension   Cholelithiasis Continue monitoring          Comorbid Conditions: Nutritional Disorders:    Hypoalbuminemia:  Hypoalbuminemia present with lowest albumin  of 2.   Hypoalbuminemia is associated with increased risk for patients.  We will attempt to treat the underlying condition(s) contributing to this low albumin  state. Hematologic Disorders:    Anemia:  Anemia present with lowest hemoglobin of 7.9.  Will continue to monitor.         VTE Prophylaxis  + Anticoagulant Ordered  [START ON 01/11/2024] enoxaparin , 40 mg, Subcutaneous, Daily          PREESHINI MARQUIS MADDEN, MD 01/12/2024

## 2024-01-13 LAB — CULTURE, GROUP A STREP (THRC)

## 2024-01-14 NOTE — Progress Notes (Signed)
 Urology Brief Progress Note  HPI: Robert Vega is a 83 y.o. old male with a history of newly diagnosed muscle-invasive urothelial carcinoma and bilateral forneal rupture and bilateral urinomas for whom urology is consulted for evaluation of source of sepsis which was initially treated at OSH. Notably, he was bacteremic with culture with E. Faecium (vanco resistant).   Interval events: - AF and HDS - UOP 2.4L, 4L in the last 24 hours - Having Bms - Cr 0.9 - WBC 9.1 from 9.6 from 11.9 - On linezolid  and zosyn  - Urine culture from 8/15 with 10-50 non-hemolytic GPO - Blood culture from 8/15 with NG at 3 days - CTAP with contrast 8/18 demonstrated roughly similar but slightly decreased size of L RP fluid collection. - ID recommends perc drainage of urinomas  Assessment: Clinically stable and continuing to improve on antibiosis. His urinomas are slightly decreased but roughly similar in size. If ID recommends drainage, can consider aspiration with CT body and send for culture. Alternatively, given he remains stable, can consider to manage medically.  Recommendations:  - Continue foley catheter - Options for management of urinomas include CT guided aspiration vs medical management with antibiosis  Follow-up: 02/13/24  Future Appointments  Date Time Provider Department Center  02/13/2024  2:45 PM Zora Blossom, MD [facility]    Please re-engage Urology @ [phone] with questions or concerns.  Phil Falconer, MD Urology, PGY-4     ------------------------------------------------------------------------------- Attestation signed by Lennie, Darice Reusing, MD at 01/14/2024  2:27 PM I read the note, reviewed the relevant images and laboratory and radiographic results and discussed the management with the responsible resident. I agree with the plan as stated.   Attestation Statement:   This service was rendered under my overall direction and control, and I was immediately  available via phone/pager or present on site.  KAREN REUSING LENNIE, MD  -------------------------------------------------------------------------------

## 2024-01-17 IMAGING — MR MR HEAD W/O CM
11 series · 48 of 48 positions shown · non-contrast
Comparison: None.

CLINICAL DATA: Transient ischemic attack

EXAM:
MRI HEAD WITHOUT CONTRAST
TECHNIQUE: Multiplanar, multiecho pulse sequences of the brain and surrounding
structures were obtained without intravenous contrast.

[Series 5: ax dwi_tracew · axial · 3.0mm · 0.65mm/px · z∈[-91,+71]mm · 4 of 50 slices shown]
[im 1/50]
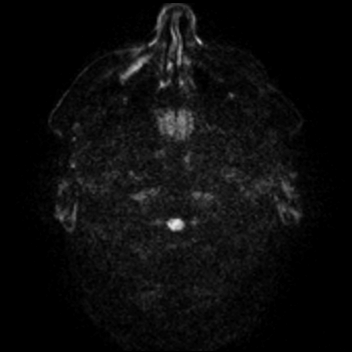
[im 17/50]
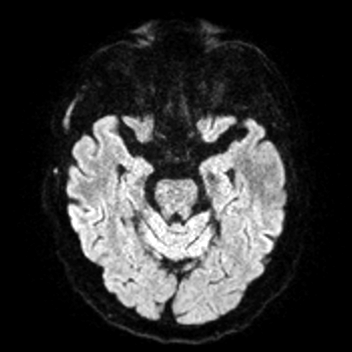
[im 33/50]
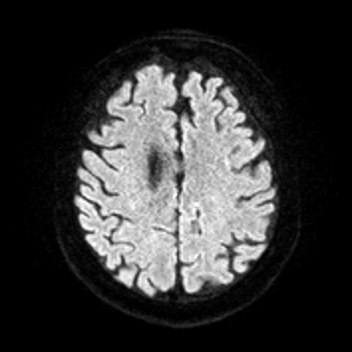
[im 50/50]
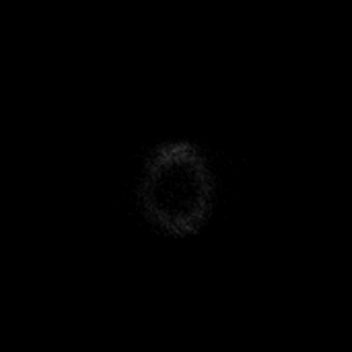

[Series 6: ax dwi_adc · axial · 3.0mm · 0.65mm/px · z∈[-91,+71]mm · 4 of 50 slices shown]
[im 1/50]
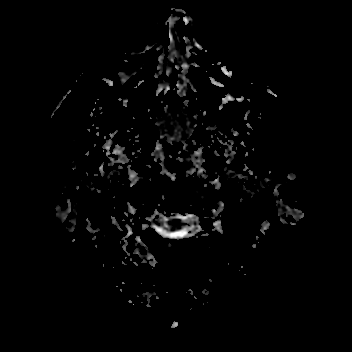
[im 17/50]
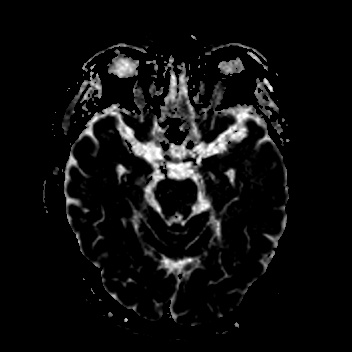
[im 33/50]
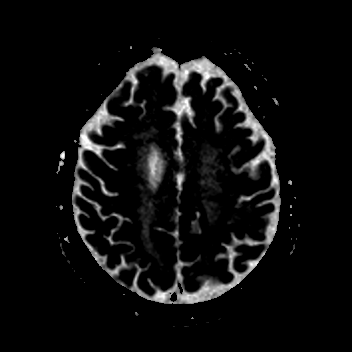
[im 50/50]
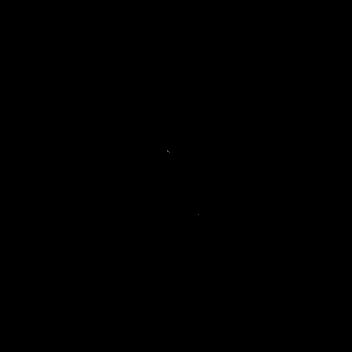

[Series 7: cor dwi_tracew · coronal · 5.0mm · 0.65mm/px · 3 of 40 slices shown]
[im 1/40]
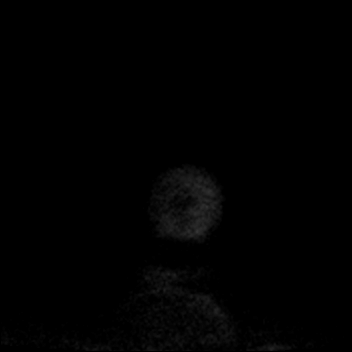
[im 20/40]
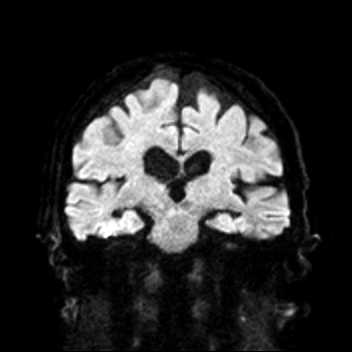
[im 40/40]
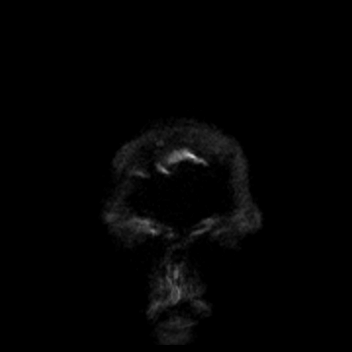

[Series 8: cor dwi_adc · coronal · 5.0mm · 0.65mm/px · 3 of 39 slices shown]
[im 1/39]
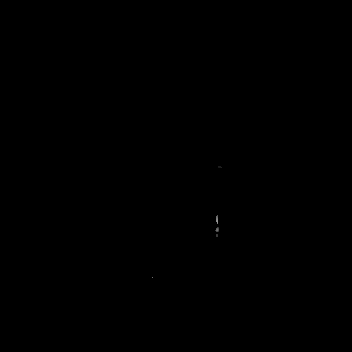
[im 20/39]
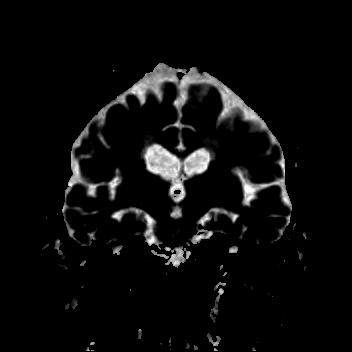
[im 39/39]
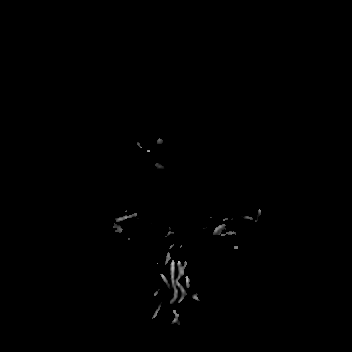

[Series 9: T1 · sagittal · 5.0mm · 0.62mm/px · 2 of 25 slices shown (1 of 2)]
[im 1/25]
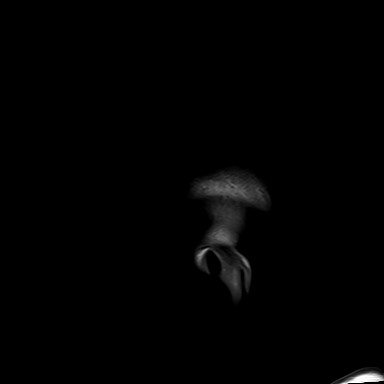
[im 25/25]
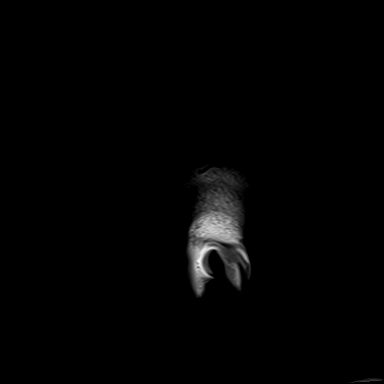

[Series 10: T2 · axial · 5.0mm · 0.53mm/px · z∈[-94,+74]mm · 2 of 29 slices shown (1 of 2)]
[im 1/29]
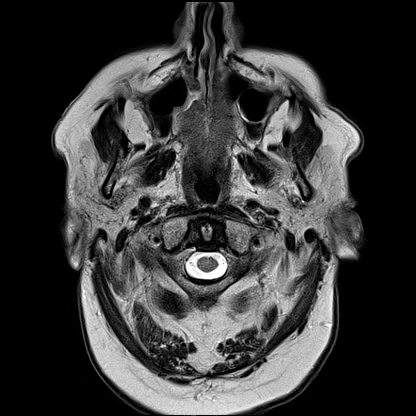
[im 29/29]
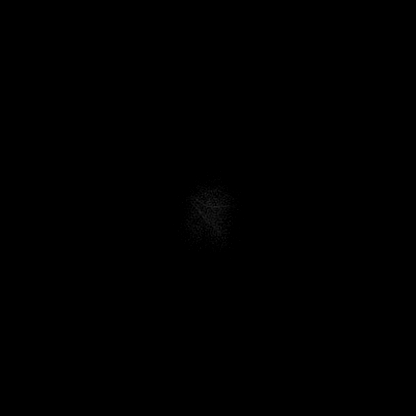

[Series 12: pha_images · axial · 3.0mm · 0.90mm/px · z∈[-98,+73]mm · 5 of 58 slices shown]
[im 1/58]
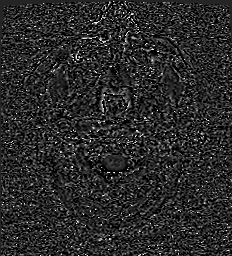
[im 15/58]
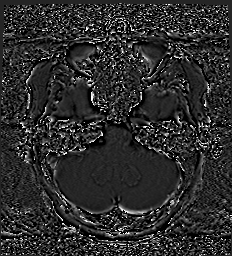
[im 29/58]
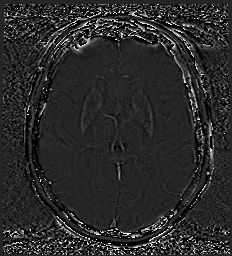
[im 43/58]
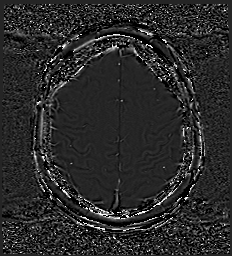
[im 58/58]
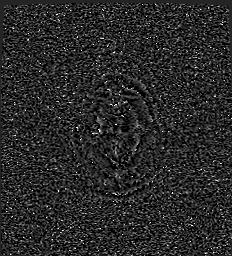

[Series 13: swi_images · axial · 3.0mm · 0.90mm/px · z∈[-98,+79]mm · 5 of 60 slices shown]
[im 1/60]
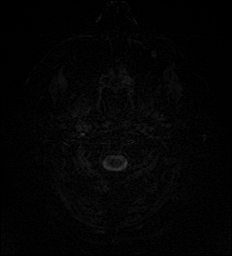
[im 15/60]
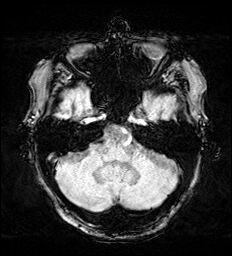
[im 30/60]
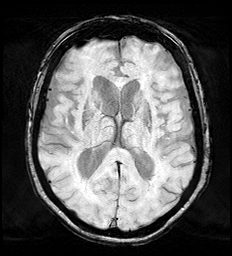
[im 45/60]
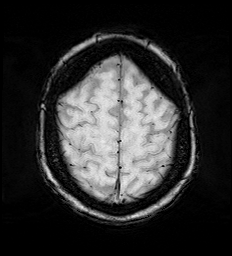
[im 60/60]
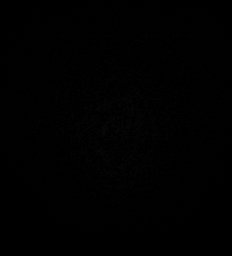

[Series 15: FLAIR · axial · 3.0mm · 0.53mm/px · z∈[-91,+71]mm · 4 of 55 slices shown]
[im 1/55]
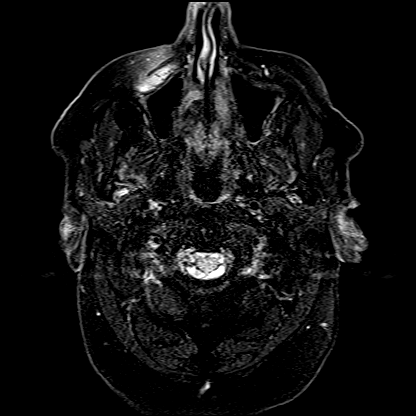
[im 19/55]
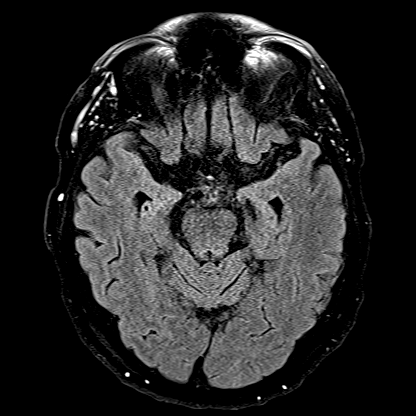
[im 37/55]
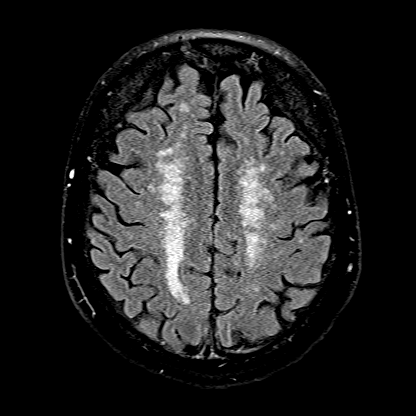
[im 55/55]
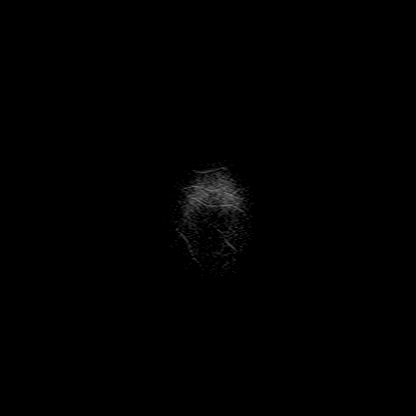

[Series 16: T1 · axial · 1.0mm · 0.98mm/px · z∈[-94,+81]mm · 14 of 175 slices shown (2 of 2)]
[im 1/175]
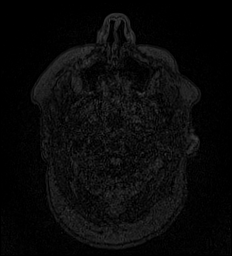
[im 14/175]
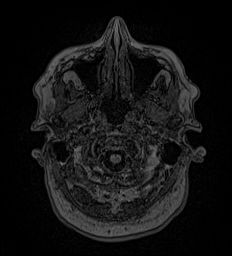
[im 27/175]
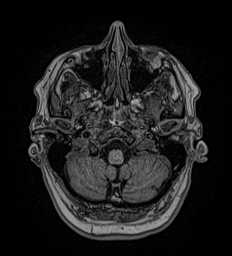
[im 41/175]
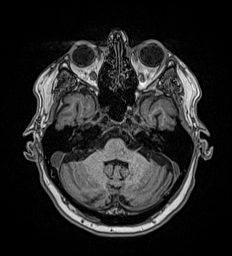
[im 54/175]
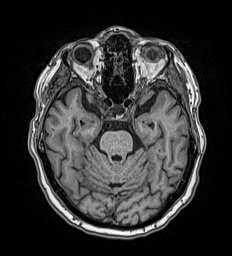
[im 67/175]
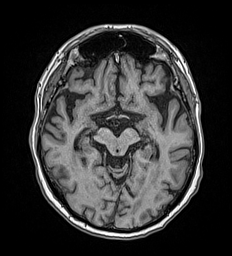
[im 81/175]
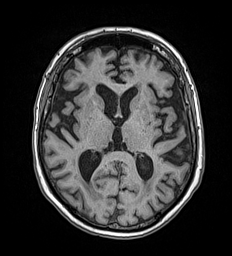
[im 94/175]
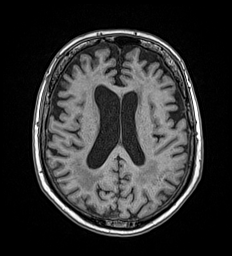
[im 108/175]
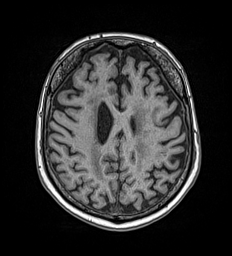
[im 121/175]
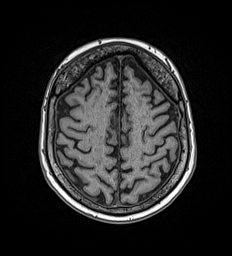
[im 134/175]
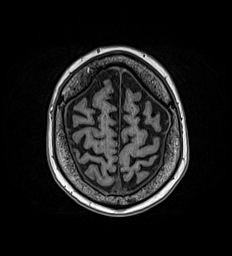
[im 148/175]
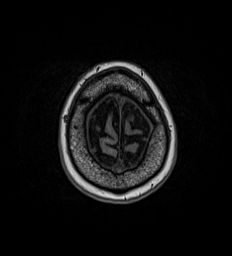
[im 161/175]
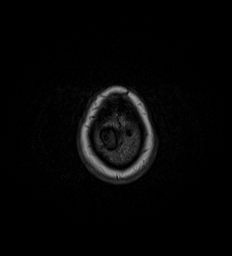
[im 175/175]
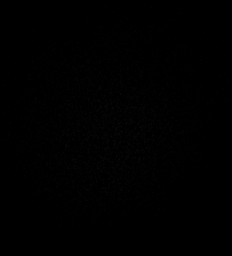

[Series 17: T2 · coronal · 5.0mm · 0.57mm/px · 2 of 31 slices shown (2 of 2)]
[im 1/31]
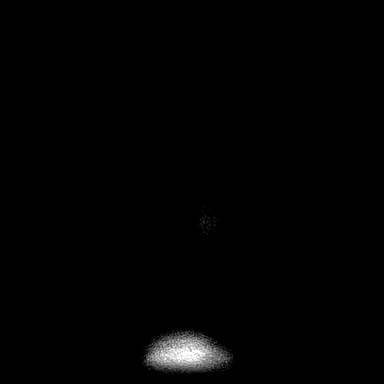
[im 31/31]
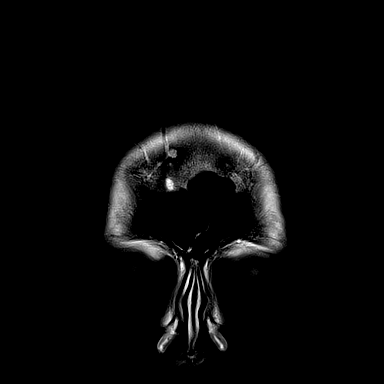

[48 of 48 positions shown; findings below may reference images not displayed]

FINDINGS: Brain: No acute infarct, mass effect or extra-axial collection. No
acute or chronic hemorrhage. There is multifocal hyperintense
T2-weighted signal within the white matter. Generalized volume loss
without a clear lobar predilection. A partially empty sella is
incidentally noted.

Vascular: Major flow voids are preserved.

Skull and upper cervical spine: Normal calvarium and skull base.
Visualized upper cervical spine and soft tissues are normal.

Sinuses/Orbits:No paranasal sinus fluid levels or advanced mucosal
thickening. No mastoid or middle ear effusion. Normal orbits.
IMPRESSION: 1. No acute intracranial abnormality.
2. Chronic small vessel ischemia and generalized volume loss.

## 2024-03-07 ENCOUNTER — Emergency Department
Admission: EM | Admit: 2024-03-07 | Discharge: 2024-03-08 | Attending: Emergency Medicine | Admitting: Emergency Medicine

## 2024-03-07 ENCOUNTER — Other Ambulatory Visit: Payer: Self-pay

## 2024-03-07 ENCOUNTER — Emergency Department

## 2024-03-07 DIAGNOSIS — I10 Essential (primary) hypertension: Secondary | ICD-10-CM | POA: Insufficient documentation

## 2024-03-07 DIAGNOSIS — T83022A Displacement of nephrostomy catheter, initial encounter: Secondary | ICD-10-CM | POA: Diagnosis present

## 2024-03-07 DIAGNOSIS — Y732 Prosthetic and other implants, materials and accessory gastroenterology and urology devices associated with adverse incidents: Secondary | ICD-10-CM | POA: Diagnosis not present

## 2024-03-07 DIAGNOSIS — Z8551 Personal history of malignant neoplasm of bladder: Secondary | ICD-10-CM | POA: Diagnosis not present

## 2024-03-07 DIAGNOSIS — A419 Sepsis, unspecified organism: Secondary | ICD-10-CM | POA: Diagnosis not present

## 2024-03-07 LAB — CBC WITH DIFFERENTIAL/PLATELET
Abs Immature Granulocytes: 0.15 K/uL — ABNORMAL HIGH (ref 0.00–0.07)
Basophils Absolute: 0.1 K/uL (ref 0.0–0.1)
Basophils Relative: 0 %
Eosinophils Absolute: 0 K/uL (ref 0.0–0.5)
Eosinophils Relative: 0 %
HCT: 33.7 % — ABNORMAL LOW (ref 39.0–52.0)
Hemoglobin: 10.8 g/dL — ABNORMAL LOW (ref 13.0–17.0)
Immature Granulocytes: 1 %
Lymphocytes Relative: 7 %
Lymphs Abs: 1.2 K/uL (ref 0.7–4.0)
MCH: 29.5 pg (ref 26.0–34.0)
MCHC: 32 g/dL (ref 30.0–36.0)
MCV: 92.1 fL (ref 80.0–100.0)
Monocytes Absolute: 1.7 K/uL — ABNORMAL HIGH (ref 0.1–1.0)
Monocytes Relative: 10 %
Neutro Abs: 13.3 K/uL — ABNORMAL HIGH (ref 1.7–7.7)
Neutrophils Relative %: 82 %
Platelets: 433 K/uL — ABNORMAL HIGH (ref 150–400)
RBC: 3.66 MIL/uL — ABNORMAL LOW (ref 4.22–5.81)
RDW: 18.8 % — ABNORMAL HIGH (ref 11.5–15.5)
WBC: 16.5 K/uL — ABNORMAL HIGH (ref 4.0–10.5)
nRBC: 0 % (ref 0.0–0.2)

## 2024-03-07 LAB — URINALYSIS, W/ REFLEX TO CULTURE (INFECTION SUSPECTED)
Bilirubin Urine: NEGATIVE
Glucose, UA: NEGATIVE mg/dL
Ketones, ur: NEGATIVE mg/dL
Nitrite: NEGATIVE
Protein, ur: NEGATIVE mg/dL
RBC / HPF: >50 RBC/hpf (ref 0–5)
Specific Gravity, Urine: 1.005 (ref 1.005–1.030)
Squamous Epithelial / HPF: 0 /HPF (ref 0–5)
WBC, UA: >50 WBC/hpf (ref 0–5)
pH: 5 (ref 5.0–8.0)

## 2024-03-07 LAB — BASIC METABOLIC PANEL WITH GFR
Anion gap: 12 (ref 5–15)
BUN: 18 mg/dL (ref 8–23)
CO2: 25 mmol/L (ref 22–32)
Calcium: 8.5 mg/dL — ABNORMAL LOW (ref 8.9–10.3)
Chloride: 97 mmol/L — ABNORMAL LOW (ref 98–111)
Creatinine, Ser: 1.41 mg/dL — ABNORMAL HIGH (ref 0.61–1.24)
GFR, Estimated: 49 mL/min — ABNORMAL LOW (ref >60)
Glucose, Bld: 119 mg/dL — ABNORMAL HIGH (ref 70–99)
Potassium: 4.3 mmol/L (ref 3.5–5.1)
Sodium: 134 mmol/L — ABNORMAL LOW (ref 135–145)

## 2024-03-07 LAB — LACTIC ACID, PLASMA
Lactic Acid, Venous: 2.9 mmol/L (ref 0.5–1.9)
Lactic Acid, Venous: 1.8 mmol/L (ref 0.5–1.9)

## 2024-03-07 MED ORDER — LIDOCAINE HCL URETHRAL/MUCOSAL 2 % EX GEL
1.0000 | Freq: Once | CUTANEOUS | Status: AC
  Administered 2024-03-07: 1 via URETHRAL
  Filled 2024-03-07: qty 10

## 2024-03-07 MED ORDER — SODIUM CHLORIDE 0.9 % IV BOLUS
1000.0000 mL | Freq: Once | INTRAVENOUS | Status: AC
  Administered 2024-03-07: 1000 mL via INTRAVENOUS

## 2024-03-07 MED ORDER — SODIUM CHLORIDE 0.9 % IV SOLN
1.0000 g | Freq: Three times a day (TID) | INTRAVENOUS | Status: DC
  Administered 2024-03-07 – 2024-03-08 (×2): 1 g via INTRAVENOUS
  Filled 2024-03-07 (×2): qty 20

## 2024-03-07 NOTE — ED Notes (Signed)
 Called [facility] and spoke with Jess. Dr. Viviann wants to transfer the patient there due to patient pulled out Nephrostomy Tubes. Transferred call to Dr. Viviann.

## 2024-03-07 NOTE — ED Provider Notes (Addendum)
 [facility] Provider Note    Event Date/Time   First MD Initiated Contact with Patient 03/07/24 1915     (approximate)   History   Chief Complaint: R sided nephrostomy tube pulled out   HPI  Robert Vega is a 83 y.o. male with a history of bladder cancer with bilateral ureteral stents and bilateral nephrostomy tubes, GERD, hypertension who is sent to the ED tonight due to inadvertent removal of his right nephrostomy tube.  Patient reports he was tired of it being there.  Denies pain.  Seems confused, not able to provide detailed history.        Past Medical History:  Diagnosis Date   Allergy    Arthritis    BPH (benign prostatic hypertrophy)    Colonic polyp    GERD (gastroesophageal reflux disease)    Has LPR   Hypertension     Current Outpatient Rx   Order #: [id number] Class: Historical Med   Order #: [id number] Class: Historical Med   Order #: [id number] Class: Historical Med   Order #: [id number] Class: Historical Med   Order #: [id number] Class: Historical Med   Order #: [id number] Class: Historical Med   Order #: [id number] Class: Historical Med   Order #: [id number] Class: Historical Med   Order #: [id number] Class: Historical Med   Order #: [id number] Class: Historical Med   Order #: [id number] Class: Historical Med   Order #: [id number] Class: Historical Med   Order #: [id number] Class: Historical Med   Order #: [id number] Class: Historical Med   Order #: [id number] Class: Historical Med   Order #: [id number] Class: Normal   Order #: [id number] Class: Historical Med   Order #: [id number] Class: Historical Med   Order #: [id number] Class: Historical Med   Order #: [id number] Class: Historical Med   Order #: [id number] Class: Historical Med   Order #: [id number] Class: Historical Med   Order #: [id number] Class: Normal   Order #: [id number] Class: Historical Med   Order #: [id number] Class: Historical Med   Order #: [id number] Class: Historical Med    Past Surgical  History:  Procedure Laterality Date   CATARACT EXTRACTION     2011, other in 2013   COLONOSCOPY  2011   CREATION, GASTROSTOMY, OPEN N/A 10/22/2023   Procedure: CREATION, GASTROSTOMY, OPEN; GASTROSTOMY CLOSURE;  Surgeon: Marinda Jayson KIDD, MD;  Location: [facility] ORS;  Service: General;  Laterality: N/A;   DIALYSIS/PERMA CATHETER INSERTION N/A 11/21/2023   Procedure: DIALYSIS/PERMA CATHETER INSERTION;  Surgeon: Marea Selinda RAMAN, MD;  Location: [facility] INVASIVE CV LAB;  Service: Cardiovascular;  Laterality: N/A;   ESOPHAGOGASTRODUODENOSCOPY N/A 10/15/2023   Procedure: EGD (ESOPHAGOGASTRODUODENOSCOPY);  Surgeon: Jinny Carmine, MD;  Location: [facility] ENDOSCOPY;  Service: Endoscopy;  Laterality: N/A;  WILL NEED BOTOX    ESOPHAGOGASTRODUODENOSCOPY N/A 11/05/2023   Procedure: EGD (ESOPHAGOGASTRODUODENOSCOPY);  Surgeon: Jinny Carmine, MD;  Location: [facility] ENDOSCOPY;  Service: Endoscopy;  Laterality: N/A;   ESOPHAGOGASTRODUODENOSCOPY (EGD) WITH PROPOFOL  N/A 08/30/2020   Procedure: ESOPHAGOGASTRODUODENOSCOPY (EGD) WITH PROPOFOL ;  Surgeon: Therisa Bi, MD;  Location: [facility] ENDOSCOPY;  Service: Gastroenterology;  Laterality: N/A;   ESOPHAGOGASTRODUODENOSCOPY (EGD) WITH PROPOFOL  N/A 04/02/2022   Procedure: ESOPHAGOGASTRODUODENOSCOPY (EGD) WITH PROPOFOL ;  Surgeon: Therisa Bi, MD;  Location: [facility] ENDOSCOPY;  Service: Gastroenterology;  Laterality: N/A;   LAPAROTOMY N/A 10/22/2023   Procedure: LAPAROTOMY, EXPLORATORY;  Surgeon: Marinda Jayson KIDD, MD;  Location: [facility] ORS;  Service: General;  Laterality: N/A;   PEG PLACEMENT N/A 10/08/2023   Procedure: INSERTION, PEG TUBE;  Surgeon: Jinny Carmine, MD;  Location: [facility] ENDOSCOPY;  Service: Endoscopy;  Laterality: N/A;   skin  cancer removal     TEMPORARY DIALYSIS CATHETER N/A 11/11/2023   Procedure: TEMPORARY DIALYSIS CATHETER;  Surgeon: Marea Selinda RAMAN, MD;  Location: [facility] INVASIVE CV LAB;  Service: Cardiovascular;  Laterality: N/A;    Physical Exam   Triage Vital Signs: ED Triage Vitals   Encounter Vitals Group     BP 03/07/24 1922 105/78     Girls Systolic BP Percentile --      Girls Diastolic BP Percentile --      Boys Systolic BP Percentile --      Boys Diastolic BP Percentile --      Pulse Rate 03/07/24 1922 (!) 102     Resp 03/07/24 1922 (!) 24     Temp 03/07/24 1922 99.7 F (37.6 C)     Temp Source 03/07/24 1922 Rectal     SpO2 03/07/24 1922 99 %     Weight 03/07/24 1923 189 lb 9.5 oz (86 kg)     Height 03/07/24 1923 5' 10 (1.778 m)     Head Circumference --      [facility] Flow --      Pain Score 03/07/24 1923 0     Pain Loc --      Pain Education --      Exclude from Growth Chart --     Most recent vital signs: Vitals:   03/07/24 2159 03/07/24 2254  BP: 116/77 133/76  Pulse: 100 87  Resp: 20 (!) 24  Temp: 97.9 F (36.6 C) 98.2 F (36.8 C)  SpO2: 99% 97%    General: Awake, no distress.  CV:  Good peripheral perfusion.  Tachycardia heart rate 105 Resp:  Normal effort.  Tachypnea, respiratory rate of 25.  Clear lungs Abd:  No distention.  Soft nontender Other:  Right nephrostomy tube is completely absent.  Left nephrostomy tube appears to have been withdrawn by about 5 cm.   ED Results / Procedures / Treatments   Labs (all labs ordered are listed, but only abnormal results are displayed) Labs Reviewed  BASIC METABOLIC PANEL WITH GFR - Abnormal; Notable for the following components:      Result Value   Sodium 134 (*)    Chloride 97 (*)    Glucose, Bld 119 (*)    Creatinine, Ser 1.41 (*)    Calcium  8.5 (*)    GFR, Estimated 49 (*)    All other components within normal limits  CBC WITH DIFFERENTIAL/PLATELET - Abnormal; Notable for the following components:   WBC 16.5 (*)    RBC 3.66 (*)    Hemoglobin 10.8 (*)    HCT 33.7 (*)    RDW 18.8 (*)    Platelets 433 (*)    Neutro Abs 13.3 (*)    Monocytes Absolute 1.7 (*)    Abs Immature Granulocytes 0.15 (*)    All other components within normal limits  LACTIC ACID, PLASMA - Abnormal; Notable  for the following components:   Lactic Acid, Venous 2.9 (*)    All other components within normal limits  CULTURE, BLOOD (ROUTINE X 2)  CULTURE, BLOOD (ROUTINE X 2)  LACTIC ACID, PLASMA  URINALYSIS, W/ REFLEX TO CULTURE (INFECTION SUSPECTED)     EKG Interpreted by me Atrial fibrillation, rate of 103.  Normal axis and intervals.  No acute ischemic changes.   RADIOLOGY CT abdomen pelvis interpreted by me, shows displacement of the left nephrostomy tube, right nephrostomy absent.  Bilateral ureteral stents are in place.   PROCEDURES:  .Critical Care  Performed by: Viviann Pastor, MD Authorized by: Viviann Pastor, MD   Critical care provider statement:    Critical care time (minutes):  35   Critical care time was exclusive of:  Separately billable procedures and treating other patients   Critical care was necessary to treat or prevent imminent or life-threatening deterioration of the following conditions:  Sepsis   Critical care was time spent personally by me on the following activities:  Development of treatment plan with patient or surrogate, discussions with consultants, evaluation of patient's response to treatment, examination of patient, obtaining history from patient or surrogate, ordering and performing treatments and interventions, ordering and review of laboratory studies, ordering and review of radiographic studies, pulse oximetry, re-evaluation of patient's condition and review of old charts   Care discussed with: accepting provider at another facility      MEDICATIONS ORDERED IN ED: Medications  meropenem (MERREM) 1 g in sodium chloride  0.9 % 100 mL IVPB (0 g Intravenous Stopped 03/07/24 2241)  sodium chloride  0.9 % bolus 1,000 mL (1,000 mLs Intravenous New Bag/Given 03/07/24 2210)  lidocaine  (XYLOCAINE ) 2 % jelly 1 Application (1 Application Urethral Given 03/07/24 2140)     IMPRESSION / MDM / ASSESSMENT AND PLAN / ED COURSE  I reviewed the triage vital  signs and the nursing notes.  DDx: UTI, sepsis, acute obstructive renal failure, electrolyte derangement  Patient's presentation is most consistent with acute presentation with potential threat to life or bodily function.  Patient presents with displaced nephrostomy tube.  Unfortunately both have been dislodged at this point, risking acute renal insufficiency.  He also is febrile, tachycardic, to tachypneic, worrisome for recurrent UTI and sepsis.  He has had complicated medical history with bladder cancer, treated extensively at [facility].     Clinical Course as of 03/07/24 2326  Sat Mar 07, 2024  2045 Presentation discussed with patient's granddaughter Brittany Gallentine, [place] who also notes patient has needed general anesthesia and intubation for airway protection for procedures in the past.  Has had recurrent episodes of UTI and sepsis.  She consents for transfer to [facility] and specifically requests that he be treated at [facility] if possible.  With abnormal vitals today, I am worried about sepsis, will start broad-spectrum antibiotics, contact [facility] to request transfer. [PS]  2132 Granddaughter also notes that patient was asymptomatically tested for COVID 2 weeks ago at [facility] due to an outbreak in the facility.  Patient tested positive at that time. [PS]    Clinical Course User Index [PS] Viviann Pastor, MD    ----------------------------------------- 11:26 PM on 03/07/2024 ----------------------------------------- Discussed with Dr. Antonina at [facility] who accepts for transfer.   FINAL CLINICAL IMPRESSION(S) / ED DIAGNOSES   Final diagnoses:  Sepsis, due to unspecified organism, unspecified whether acute organ dysfunction present (HCC)  Nephrostomy tube displaced     Rx / DC Orders   ED Discharge Orders     None        Note:  This document was prepared using Dragon voice recognition software and may include unintentional dictation errors.   Viviann Pastor, MD 03/07/24 2133     Viviann Pastor, MD 03/07/24 [phone]

## 2024-03-07 NOTE — ED Provider Notes (Signed)
 11:08 PM  Assumed care at shift change.  Coming from [facility].  H/o Bladder cancer Bilateral ureteral stents Bilateral nephrostomies which he pulled out on his on, R still in back but not in kidney on CT, left completely oput Recurrent utis, sepsis - looks like UTI and sepsis today COVID + 2 weeks ago Possible transfer to [facility] for continuity of care  11:36 PM  Accepted by [facility] urology per Dr. Viviann.   Jaelene Garciagarcia, Josette SAILOR, DO 03/07/24 2337   1:50 AM  Pt resting comfortably.  Has had slight drop in blood pressure.  Will give second liter IV fluid bolus.  Initial lactic elevated 2.9 but is now down trended.  Awaiting transport to [facility].  Bed ready but do not have ambulance for transport yet.   Bhumi Godbey, Josette SAILOR, DO 03/08/24 0151   6:10 AM  Pt hemodynamically stable.  Blood pressure has improved and he has not required any further IV fluid boluses or vasopressors.  Resting comfortably.  Awaiting transport during first shift from [facility].   Adam Sanjuan, Josette SAILOR, DO 03/08/24 [phone]

## 2024-03-07 NOTE — ED Notes (Signed)
 Fall risk bundle is currently in place.

## 2024-03-08 MED ORDER — ONDANSETRON HCL 4 MG/2ML IJ SOLN: 4.0000 mg | Freq: Four times a day (QID) | INTRAMUSCULAR | Status: DC | PRN

## 2024-03-08 MED ORDER — ACETAMINOPHEN 500 MG PO TABS: 1000.0000 mg | ORAL_TABLET | Freq: Four times a day (QID) | ORAL | Status: DC | PRN

## 2024-03-08 MED ORDER — SODIUM CHLORIDE 0.9 % IV BOLUS (SEPSIS)
1000.0000 mL | Freq: Once | INTRAVENOUS | Status: AC
  Administered 2024-03-08: 1000 mL via INTRAVENOUS

## 2024-03-08 MED ORDER — SODIUM CHLORIDE 0.9 % IV SOLN: INTRAVENOUS | Status: DC

## 2024-03-08 NOTE — ED Notes (Signed)
@   35 Called [facility]; no unit call during 1st

## 2024-03-08 NOTE — ED Notes (Signed)
@   9489 Per Ginnie ([facility] rep) pt 1st on list to transport during 1st shift

## 2024-03-08 NOTE — ED Notes (Signed)
 [facility] providing transport; pick up time unknown

## 2024-03-08 NOTE — ED Notes (Signed)
 Per [facility] transfer center, pt is accepted to [facility] main hospital at [address] in [place].  Dr. Lamar Alar accepting, going to room 9120.  Number for report is [phone].  For [facility] to arrange transport call [phone]

## 2024-03-10 LAB — URINE CULTURE: Culture: >=100000 — AB

## 2024-03-12 LAB — CULTURE, BLOOD (ROUTINE X 2)
Culture: NO GROWTH
Culture: NO GROWTH
Special Requests: ADEQUATE

## 2024-03-26 ENCOUNTER — Emergency Department

## 2024-03-26 ENCOUNTER — Other Ambulatory Visit: Payer: Self-pay

## 2024-03-26 ENCOUNTER — Emergency Department
Admission: EM | Admit: 2024-03-26 | Discharge: 2024-03-27 | Disposition: A | Discharge status: Other Acute Inpatient Hospital | Attending: Emergency Medicine | Admitting: Emergency Medicine

## 2024-03-26 DIAGNOSIS — Z8551 Personal history of malignant neoplasm of bladder: Secondary | ICD-10-CM | POA: Insufficient documentation

## 2024-03-26 DIAGNOSIS — N179 Acute kidney failure, unspecified: Secondary | ICD-10-CM | POA: Diagnosis not present

## 2024-03-26 DIAGNOSIS — B9689 Other specified bacterial agents as the cause of diseases classified elsewhere: Secondary | ICD-10-CM | POA: Insufficient documentation

## 2024-03-26 DIAGNOSIS — T83092A Other mechanical complication of nephrostomy catheter, initial encounter: Secondary | ICD-10-CM | POA: Diagnosis not present

## 2024-03-26 DIAGNOSIS — R509 Fever, unspecified: Secondary | ICD-10-CM | POA: Diagnosis present

## 2024-03-26 DIAGNOSIS — Y732 Prosthetic and other implants, materials and accessory gastroenterology and urology devices associated with adverse incidents: Secondary | ICD-10-CM | POA: Diagnosis not present

## 2024-03-26 DIAGNOSIS — U071 COVID-19: Secondary | ICD-10-CM | POA: Diagnosis not present

## 2024-03-26 DIAGNOSIS — I959 Hypotension, unspecified: Secondary | ICD-10-CM | POA: Insufficient documentation

## 2024-03-26 DIAGNOSIS — A419 Sepsis, unspecified organism: Secondary | ICD-10-CM | POA: Diagnosis not present

## 2024-03-26 DIAGNOSIS — N390 Urinary tract infection, site not specified: Secondary | ICD-10-CM | POA: Diagnosis not present

## 2024-03-26 DIAGNOSIS — T83098A Other mechanical complication of other indwelling urethral catheter, initial encounter: Secondary | ICD-10-CM

## 2024-03-26 DIAGNOSIS — I10 Essential (primary) hypertension: Secondary | ICD-10-CM | POA: Diagnosis not present

## 2024-03-26 LAB — CBC WITH DIFFERENTIAL/PLATELET
Abs Immature Granulocytes: 0.07 K/uL (ref 0.00–0.07)
Basophils Absolute: 0 K/uL (ref 0.0–0.1)
Basophils Relative: 0 %
Eosinophils Absolute: 0 K/uL (ref 0.0–0.5)
Eosinophils Relative: 0 %
HCT: 27.8 % — ABNORMAL LOW (ref 39.0–52.0)
Hemoglobin: 9 g/dL — ABNORMAL LOW (ref 13.0–17.0)
Immature Granulocytes: 1 %
Lymphocytes Relative: 4 %
Lymphs Abs: 0.5 K/uL — ABNORMAL LOW (ref 0.7–4.0)
MCH: 30 pg (ref 26.0–34.0)
MCHC: 32.4 g/dL (ref 30.0–36.0)
MCV: 92.7 fL (ref 80.0–100.0)
Monocytes Absolute: 0.5 K/uL (ref 0.1–1.0)
Monocytes Relative: 4 %
Neutro Abs: 11.4 K/uL — ABNORMAL HIGH (ref 1.7–7.7)
Neutrophils Relative %: 91 %
Platelets: 274 K/uL (ref 150–400)
RBC: 3 MIL/uL — ABNORMAL LOW (ref 4.22–5.81)
RDW: 19.8 % — ABNORMAL HIGH (ref 11.5–15.5)
WBC: 12.5 K/uL — ABNORMAL HIGH (ref 4.0–10.5)
nRBC: 0 % (ref 0.0–0.2)

## 2024-03-26 LAB — COMPREHENSIVE METABOLIC PANEL WITH GFR
ALT: 11 U/L (ref 0–44)
AST: 26 U/L (ref 15–41)
Albumin: 2.2 g/dL — ABNORMAL LOW (ref 3.5–5.0)
Alkaline Phosphatase: 104 U/L (ref 38–126)
Anion gap: 17 — ABNORMAL HIGH (ref 5–15)
BUN: 47 mg/dL — ABNORMAL HIGH (ref 8–23)
CO2: 21 mmol/L — ABNORMAL LOW (ref 22–32)
Calcium: 8.1 mg/dL — ABNORMAL LOW (ref 8.9–10.3)
Chloride: 96 mmol/L — ABNORMAL LOW (ref 98–111)
Creatinine, Ser: 4.23 mg/dL — ABNORMAL HIGH (ref 0.61–1.24)
GFR, Estimated: 13 mL/min — ABNORMAL LOW (ref >60)
Glucose, Bld: 135 mg/dL — ABNORMAL HIGH (ref 70–99)
Potassium: 3.9 mmol/L (ref 3.5–5.1)
Sodium: 134 mmol/L — ABNORMAL LOW (ref 135–145)
Total Bilirubin: 0.5 mg/dL (ref 0.0–1.2)
Total Protein: 7.5 g/dL (ref 6.5–8.1)

## 2024-03-26 LAB — RESP PANEL BY RT-PCR (RSV, FLU A&B, COVID)  RVPGX2
Influenza A by PCR: NEGATIVE
Influenza B by PCR: NEGATIVE
Resp Syncytial Virus by PCR: NEGATIVE
SARS Coronavirus 2 by RT PCR: POSITIVE — AB

## 2024-03-26 LAB — PROTIME-INR
INR: 1.1 (ref 0.8–1.2)
Prothrombin Time: 15.1 s (ref 11.4–15.2)

## 2024-03-26 LAB — LACTIC ACID, PLASMA
Lactic Acid, Venous: 4.5 mmol/L (ref 0.5–1.9)
Lactic Acid, Venous: 2.9 mmol/L (ref 0.5–1.9)
Lactic Acid, Venous: 2.4 mmol/L (ref 0.5–1.9)
Lactic Acid, Venous: 1.5 mmol/L (ref 0.5–1.9)

## 2024-03-26 MED ORDER — SODIUM CHLORIDE 0.9 % IV BOLUS
500.0000 mL | Freq: Once | INTRAVENOUS | Status: AC
  Administered 2024-03-26: 500 mL via INTRAVENOUS

## 2024-03-26 MED ORDER — METRONIDAZOLE 500 MG/100ML IV SOLN
500.0000 mg | Freq: Once | INTRAVENOUS | Status: AC
  Administered 2024-03-26: 500 mg via INTRAVENOUS
  Filled 2024-03-26: qty 100

## 2024-03-26 MED ORDER — NOREPINEPHRINE 4 MG/250ML-% IV SOLN
0.0000 ug/min | INTRAVENOUS | Status: DC
  Administered 2024-03-26: 2 ug/min via INTRAVENOUS
  Filled 2024-03-26: qty 250

## 2024-03-26 MED ORDER — LACTATED RINGERS IV BOLUS
1000.0000 mL | Freq: Once | INTRAVENOUS | Status: AC
  Administered 2024-03-26: 1000 mL via INTRAVENOUS

## 2024-03-26 MED ORDER — LINEZOLID 600 MG/300ML IV SOLN: 600.0000 mg | Freq: Two times a day (BID) | INTRAVENOUS | Status: DC

## 2024-03-26 MED ORDER — SODIUM CHLORIDE 0.9 % IV SOLN
2.0000 g | Freq: Once | INTRAVENOUS | Status: AC
  Administered 2024-03-26: 2 g via INTRAVENOUS
  Filled 2024-03-26: qty 12.5

## 2024-03-26 MED ORDER — SODIUM CHLORIDE 0.9 % IV SOLN: 250.0000 mL | INTRAVENOUS | Status: DC

## 2024-03-26 MED ORDER — SODIUM CHLORIDE 0.9 % IV SOLN: 1.0000 g | Freq: Three times a day (TID) | INTRAVENOUS | Status: DC

## 2024-03-26 MED ORDER — VANCOMYCIN HCL IN DEXTROSE 1-5 GM/200ML-% IV SOLN
1000.0000 mg | Freq: Once | INTRAVENOUS | Status: AC
  Administered 2024-03-26: 1000 mg via INTRAVENOUS
  Filled 2024-03-26: qty 200

## 2024-03-26 MED ORDER — HYDROCORTISONE SOD SUC (PF) 100 MG IJ SOLR
50.0000 mg | Freq: Once | INTRAMUSCULAR | Status: AC
  Administered 2024-03-26: 50 mg via INTRAVENOUS
  Filled 2024-03-26: qty 1

## 2024-03-26 MED ORDER — LACTATED RINGERS IV SOLN: INTRAVENOUS | Status: AC

## 2024-03-26 MED ORDER — LINEZOLID 600 MG/300ML IV SOLN
600.0000 mg | Freq: Once | INTRAVENOUS | Status: AC
  Administered 2024-03-26: 600 mg via INTRAVENOUS
  Filled 2024-03-26: qty 300

## 2024-03-26 MED ORDER — QUETIAPINE FUMARATE 25 MG PO TABS
12.5000 mg | ORAL_TABLET | Freq: Once | ORAL | Status: AC
  Administered 2024-03-26: 12.5 mg via ORAL
  Filled 2024-03-26: qty 1

## 2024-03-26 MED ORDER — LACTATED RINGERS IV BOLUS (SEPSIS)
1000.0000 mL | Freq: Once | INTRAVENOUS | Status: AC
  Administered 2024-03-26: 1000 mL via INTRAVENOUS

## 2024-03-26 MED ORDER — SODIUM CHLORIDE 0.9 % IV SOLN
500.0000 mg | Freq: Once | INTRAVENOUS | Status: AC
  Administered 2024-03-26: 500 mg via INTRAVENOUS
  Filled 2024-03-26: qty 10

## 2024-03-26 NOTE — Sepsis Progress Note (Signed)
 Notified bedside nurse of need to administer the last liter of fluid to meet protocol.

## 2024-03-26 NOTE — ED Notes (Signed)
 [facility]- weak and temp was 100.4  80/50 here-- 111/60 here. Does have nephrostomy tubes/ baseline confusion-

## 2024-03-26 NOTE — ED Provider Notes (Signed)
 [facility] Provider Note    Event Date/Time   First MD Initiated Contact with Patient 03/26/24 1408     (approximate)  History   Chief Complaint: Code Sepsis  HPI  Robert Vega is a 83 y.o. male with a past medical history of gastric reflux, BPH, hypertension, bilateral nephrostomy tubes, presents to the emergency department from [facility] nursing facility for fever and weakness.  According to EMS they state they were called out by [facility] nursing facility due to increased weakness found to be febrile to 100.4 at the facility as long as hypotension 80/50.  They brought the patient to the emergency department for further evaluation.  Here the patient blood pressure is 111/60.  Patient is awake alert he denies any pain, does have confusion at baseline per EMS per nursing home staff.  EMS also reported a lower O2 sat in the 80s with no baseline O2 requirement.  Physical Exam   Triage Vital Signs: ED Triage Vitals  Encounter Vitals Group     BP 03/26/24 1407 111/60     Girls Systolic BP Percentile --      Girls Diastolic BP Percentile --      Boys Systolic BP Percentile --      Boys Diastolic BP Percentile --      Pulse Rate 03/26/24 1407 97     Resp 03/26/24 1407 (!) 29     Temp --      Temp src --      SpO2 03/26/24 1407 100 %     Weight 03/26/24 1408 204 lb 12.9 oz (92.9 kg)     Height 03/26/24 1408 6' (1.829 m)     Head Circumference --      [facility] Flow --      Pain Score 03/26/24 1408 0     Pain Loc --      Pain Education --      Exclude from Growth Chart --     Most recent vital signs: Vitals:   03/26/24 1407  BP: 111/60  Pulse: 97  Resp: (!) 29  SpO2: 100%    General: Awake, no distress.  Denies any pain. CV:  Good peripheral perfusion.  Regular rate and rhythm around 100 bpm. Resp:  Normal effort.  Equal breath sounds bilaterally.  No obvious wheeze rales or rhonchi. Abd:  No distention.  Soft, nontender.  No rebound or  guarding.  No reaction to abdominal palpation.  Patient has bilateral nephrostomy tubes.  Well-appearing insertion sites with no obvious signs of infection or abscess.  ED Results / Procedures / Treatments   EKG  EKG viewed and interpreted by myself shows sinus tachycardia at 96 bpm with a narrow QRS, normal axis, largely normal intervals with nonspecific ST changes electrical interference but no ST elevation.  RADIOLOGY  I have reviewed interpret the chest x-ray images.  No obvious consolidation on my evaluation.   MEDICATIONS ORDERED IN ED: Medications  lactated ringers  infusion (has no administration in time range)  lactated ringers  bolus 1,000 mL (has no administration in time range)  ceFEPIme  (MAXIPIME ) 2 g in sodium chloride  0.9 % 100 mL IVPB (2 g Intravenous New Bag/Given 03/26/24 1438)  metroNIDAZOLE  (FLAGYL ) IVPB 500 mg (has no administration in time range)  vancomycin  (VANCOCIN ) IVPB 1000 mg/200 mL premix (has no administration in time range)     IMPRESSION / MDM / ASSESSMENT AND PLAN / ED COURSE  I reviewed the triage vital signs and  the nursing notes.  Patient's presentation is most consistent with acute presentation with potential threat to life or bodily function.  Patient presents to the emergency department with reported fever and generalized weakness from his nursing facility.  Patient meets sepsis criteria per EMS vitals with tachycardia hypoxia and fever.  Will send labs, blood cultures, urinalysis, urine culture obtain a chest x-ray and start broad-spectrum antibiotics and IV fluids while awaiting for the results.   CRITICAL CARE Performed by: Franky Moores   Total critical care time: 30 minutes  Critical care time was exclusive of separately billable procedures and treating other patients.  Critical care was necessary to treat or prevent imminent or life-threatening deterioration.  Critical care was time spent personally by me on the following  activities: development of treatment plan with patient and/or surrogate as well as nursing, discussions with consultants, evaluation of patient's response to treatment, examination of patient, obtaining history from patient or surrogate, ordering and performing treatments and interventions, ordering and review of laboratory studies, ordering and review of radiographic studies, pulse oximetry and re-evaluation of patient's condition.   FINAL CLINICAL IMPRESSION(S) / ED DIAGNOSES   Sepsis   Note:  This document was prepared using Dragon voice recognition software and may include unintentional dictation errors.   Moores Franky, MD 03/26/24 [phone]

## 2024-03-26 NOTE — ED Notes (Signed)
 I was asked to start a transfer to [facility] for this patient by Dr. Ernest. Called and spoke with Izetta in the transfer center power shared the images and faxed over facesheet.

## 2024-03-26 NOTE — Sepsis Progress Note (Signed)
 Sepsis protocol is being followed by [facility].

## 2024-03-26 NOTE — ED Triage Notes (Signed)
 Pt BIB [facility] from [facility] due to staffs concern for possible infection at nephrostomy site and worsening UTI. Currently being treated for UTI with PO antibiotic. Pt is confused at baseline. Denies pain. Has a nephrostomy bag. Does not wear o2 at baseline but was placed on 3L by EMS.[facility] Sepsis criteria.SABRA  80/50 100.4 100 HR 80'S RA 93% ON 3L

## 2024-03-26 NOTE — ED Notes (Signed)
 This RN checked drains for urine. Still no urine output

## 2024-03-26 NOTE — Consult Note (Signed)
 CODE SEPSIS - PHARMACY COMMUNICATION  **Broad Spectrum Antibiotics should be administered within 1 hour of Sepsis diagnosis**  Time Code Sepsis Called/Page Received: 1409  Antibiotics Ordered: cefepime , vancomycin , and Flagyl   Time of 1st antibiotic administration: 1438  Additional action taken by pharmacy: n/a   Kayla JULIANNA Blew ,PharmD Clinical Pharmacist  03/26/2024  2:10 PM

## 2024-03-26 NOTE — ED Notes (Signed)
 Coude catheter placed. Pt tolerated well. Urine return noted. Urine appears cloudy, purulent and dark in color. Urine sample sent.

## 2024-03-26 NOTE — ED Provider Notes (Signed)
 ----------------------------------------- 11:40 PM on 03/26/2024 -----------------------------------------  Assuming care from Dr. Ernest.  In short, Robert Vega is a 83 y.o. male with a chief complaint of possible sepsis, possible nephrostomy complications .  Refer to the original H&P for additional details.  The current plan of care is to reassess patient's hemodynamic stability after discontinuing Levophed .  Patient's family feels very strongly about him being transferred to [facility] where he receives all of his care.  Almarie Nose, PCCM, is involved down in the ED and has spoken with patient's family as well.  If patient remains hypotensive after removing Levophed , patient will need admission to [facility] ICU because no ICU-level beds are available at [facility].  Otherwise he can be transferred to [facility] hospitalist team.   Clinical Course as of 03/27/24 0707  Fri Mar 27, 2024  0032 Patient is maintaining blood pressure of about 120/70 off of pressors.  Repeat lactic acid is 1.5.  Urinalysis is strongly positive for infection.  I reach back out to [place] transfer center who expressed reluctance about transfer to a non-ICU level of care, but I reiterated multiple times that the patient has been resuscitated over 10-1/2 hours in our emergency department and is stable for transfer.  They will reach out to their charge nurse and to the providers and determine whether they are comfortable with transfer. [CF]  0145 Still awaiting callback from [facility] [CF]  0145 Sepsis reassessment complete.  Patient maintaining MAP of approx 71 off pressors. [CF]  0219 Consulted with Dr. Paulette with the [facility] hospitalist team.  We discussed the case in detail and he accepted the patient on behalf of Dr. Bernardino Daring at [facility] where he said they should all of the services needed including urology.  They are now looking for a stepdown bed and the transfer center will call back when a bed has been assigned.  Of note, the  patient is maintaining his blood pressure, most recent BP is 114/55 which is a MAP of 72.  Patient hemodynamically stable for transfer. [CF]  0441 Sepsis reassessment complete.  Patient remains stable, was recently awake and alert and tolerating PO fluids.  Still awaiting bed assignment at [facility]. [CF]  0509 Patient's blood pressure (MAP) downtrending slightly but he is asleep.  Given his continued stay in the emergency department awaiting transfer, I ordered normal saline 150 mL/h infusion to keep him hydrated particularly in the setting of his acute renal failure [CF]  0706 Transferring ED care to Dr. Suzanne while the patient continues to await a bed at [facility]. [CF]    Clinical Course User Index [CF] Gordan Huxley, MD     Medications  lactated ringers  infusion ( Intravenous New Bag/Given 03/27/24 0400)  0.9 %  sodium chloride  infusion (250 mLs Intravenous Not Given 03/26/24 2150)  norepinephrine  (LEVOPHED ) 4mg  in (0.016 mg/mL) premix infusion (0 mcg/min Intravenous Stopped 03/26/24 2316)  lactated ringers  bolus 1,000 mL (0 mLs Intravenous Stopped 03/26/24 1840)  ceFEPIme  (MAXIPIME ) 2 g in sodium chloride  0.9 % 100 mL IVPB (0 g Intravenous Stopped 03/26/24 1547)  metroNIDAZOLE  (FLAGYL ) IVPB 500 mg (0 mg Intravenous Stopped 03/26/24 1723)  vancomycin  (VANCOCIN ) IVPB 1000 mg/200 mL premix (0 mg Intravenous Stopped 03/26/24 1746)  lactated ringers  bolus 1,000 mL (0 mLs Intravenous Stopped 03/26/24 2039)  lactated ringers  bolus 1,000 mL (0 mLs Intravenous Stopped 03/26/24 2039)  meropenem (MERREM) 500 mg in sodium chloride  0.9 % 100 mL IVPB (0 mg Intravenous Stopped 03/26/24 2039)  QUEtiapine  (  SEROQUEL ) tablet 12.5 mg (12.5 mg Oral Given 03/26/24 1722)  linezolid  (ZYVOX ) IVPB 600 mg (0 mg Intravenous Stopped 03/26/24 2056)  hydrocortisone sodium succinate (SOLU-CORTEF) 100 MG injection 50 mg (50 mg Intravenous Given 03/26/24 2349)  sodium chloride  0.9 % bolus 500 mL (0 mLs  Intravenous Stopped 03/27/24 0043)     ED Discharge Orders     None      Final diagnoses:  Sepsis, due to unspecified organism, unspecified whether acute organ dysfunction present [facility])  Acute renal failure, unspecified acute renal failure type  Malfunction of nephrostomy tube  Urinary tract infection without hematuria, site unspecified     Gordan Huxley, MD 03/27/24 [phone]

## 2024-03-26 NOTE — ED Notes (Signed)
 Patient's granddaughter called and would like an update on his grandfather she said she can give an update on, her grandfather quicker. Her name is [name] Galentine and # [phone]

## 2024-03-26 NOTE — ED Notes (Signed)
 This RN attempted to place foley catheter per order from Dr. Ernest. Unable to advance catheter. Provider made aware of pts redness and leaking of urine from nephrostomy site on right side. This RN has placed abd pads and tape over site. Pt c/o pain in the area. Levophed  stopped at this time. New chux pad placed underneath pt and readjusted in bed. Pt continues to be alert and oriented to self. noted in bladder scan. Will attempt to place coude catheter.

## 2024-03-26 NOTE — ED Provider Notes (Addendum)
 5:37 PM Assumed care for off going team.   Blood pressure 118/63, pulse 96, temperature 98.5 F (36.9 C), temperature source Oral, resp. rate (!) 25, height 6' (1.829 m), weight 92.9 kg, SpO2 100%.  See their HPI for full report but in brief pending sepsis workup  Lactate down trending but BP are low with 3L of fluids  Family wanted to go to [facility]- called to [facility] and no beds in ICU.  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  .Critical Care  Performed by: Ernest Ronal BRAVO, MD Authorized by: Ernest Ronal BRAVO, MD   Critical care provider statement:    Critical care time (minutes):  30   Critical care was necessary to treat or prevent imminent or life-threatening deterioration of the following conditions:  Sepsis   Critical care was time spent personally by me on the following activities:  Development of treatment plan with patient or surrogate, discussions with consultants, evaluation of patient's response to treatment, examination of patient, ordering and review of laboratory studies, ordering and review of radiographic studies, ordering and performing treatments and interventions, pulse oximetry, re-evaluation of patient's condition and review of old charts    Discussed with the daughter who wanted to see if patient to be placed on Levophed  for short period time and see if can get off of it so he can go over to [facility].  Explained to her that we do not typically do this typically if someone needs Levophed  they are admitted to the ER ICU.  However I would place patient on 2 mcg of Levophed  and his blood pressures went up to the 130s I discussed the case with Almarie from ICU came down to evaluate patient he did report he actually looked improved when he needed the ICU in the past and 1 to give him another 500 cc of fluids and steroids and see if we can wean off the pressors.  If we can wean off the pressors the  patient could potentially go over to [facility] as they do have hospital beds open and if we are unable to wean off the pressors then patient would be admitted to our ICU here until stabilized and can go to [facility] once a bed opens either in the ICU or in the hospital team if off pressors and beds are still available.  Patient has had very little urine output from his drains and the right drain does seem to be leaking.  He is got some redness and warmth around this area.  We attempted to place a Foley but unable to get 1 to pass.  Will get bladder scan to make sure he does not have any bladder retention.  Patient will be handed off to oncoming team pending repeat blood pressures but if remains hypotensive then we will admit here to our ICU.  ICU team is already been down to evaluate patient is also talk to the daughter.  They do want to give patient a little bit more time in the ER to see if we get him over to [facility].  He does have some redness on the right side and his drain is leaking from that side.  They are having difficulties getting a Foley and so we will do a bladder scan to see if there is any retention but now that I know that there is some leaking of urine on that right side I suspect that this is where the urine  is going.  Unclear why this is happening and patient will need to have urology consulted here if he ends up staying here versus if he goes to [facility] they can evaluate him there.  Patient be handed off to oncoming team pending blood pressure stabilization             Ernest Ronal BRAVO, MD 03/26/24 2303    Ernest Ronal BRAVO, MD 03/26/24 [phone]

## 2024-03-26 NOTE — ED Notes (Signed)
 Pt pulled out ultrasound placed IV in right arm.

## 2024-03-26 NOTE — ED Notes (Signed)
 This RN attempted Iv stick x2 and Medic Cheyanne attempted Iv stick x2 with no success. Need 2 lines per sepsis protocol.

## 2024-03-27 DIAGNOSIS — T83512A Infection and inflammatory reaction due to nephrostomy catheter, initial encounter: Secondary | ICD-10-CM | POA: Diagnosis not present

## 2024-03-27 DIAGNOSIS — R410 Disorientation, unspecified: Secondary | ICD-10-CM | POA: Diagnosis not present

## 2024-03-27 DIAGNOSIS — Z7401 Bed confinement status: Secondary | ICD-10-CM | POA: Diagnosis not present

## 2024-03-27 LAB — URINALYSIS, W/ REFLEX TO CULTURE (INFECTION SUSPECTED)
Bilirubin Urine: NEGATIVE
Glucose, UA: NEGATIVE mg/dL
Ketones, ur: NEGATIVE mg/dL
Nitrite: POSITIVE — AB
Protein, ur: 100 mg/dL — AB
RBC / HPF: >50 RBC/hpf (ref 0–5)
Specific Gravity, Urine: 1.009 (ref 1.005–1.030)
Squamous Epithelial / HPF: 0 /HPF (ref 0–5)
WBC, UA: >50 WBC/hpf (ref 0–5)
pH: 5 (ref 5.0–8.0)

## 2024-03-27 MED ORDER — SODIUM CHLORIDE 0.9 % IV SOLN
500.0000 mg | Freq: Two times a day (BID) | INTRAVENOUS | Status: DC
  Administered 2024-03-27: 500 mg via INTRAVENOUS
  Filled 2024-03-27 (×2): qty 10

## 2024-03-27 MED ORDER — LINEZOLID 600 MG/300ML IV SOLN
600.0000 mg | Freq: Two times a day (BID) | INTRAVENOUS | Status: DC
  Administered 2024-03-27: 600 mg via INTRAVENOUS
  Filled 2024-03-27: qty 300

## 2024-03-27 MED ORDER — SODIUM CHLORIDE 0.9 % IV SOLN: Freq: Once | INTRAVENOUS | Status: DC

## 2024-03-27 NOTE — ED Notes (Signed)
 Re-eval Bps--- to see if we can call back [facility]

## 2024-03-27 NOTE — ED Notes (Signed)
 Ct  scan  [facility]  with [facility]

## 2024-03-27 NOTE — Care Plan (Signed)
  Problem: All patients positive for delirium Goal: Patient will remain safe Outcome: Progressing   Problem: Disorientation Goal: The patient will be oriented to person, time, or place in the environment Outcome: Progressing   Problem: Risk for Injury related to restraint use Goal: Patient will remain free from injury while in restraints Outcome: Progressing   Problem: Fall Risk Prevention for Adult Populations Goal: High Risk Fall Prevention - Adults Description: The patient will remain free from falls Outcome: Progressing Flowsheets (Taken 03/27/2024 1512) High Falls Risk Interventions: . Place yellow falls wrist band & post fall risk signage . Re-orient to falls prevention plan as needed & include personal risk factors . Utilize 3 side rails (lower rail closest to bathroom unless equipment/room/patient factor prohibits) . Reinforce calling for assistance before getting out of bed/chair . Implement toileting plan (strategy includes frequency, mobility, equipment) . Implement mobility plan (may include equipment & injury risk assessment) . Apply yellow non-skid socks . Collaborate on a safety plan with other disciplines (ex: PT/OT, Pharmacist, Provider, BRT/BHRT) . Use exit alarms (bed/stretcher, chair pad/lap belt) . Frequent or close observation (by family or RSO/team member)   Problem: Risk for Injury related to restraint use Goal: Remains free from restraints Outcome: Not Met/ Continue Goal

## 2024-03-27 NOTE — ED Notes (Signed)
 Patient's POA, [name] Galatine, gave verbal consent for via phone for patient transport to [facility]. This was confirmed by this RN and Rosina DASEN, RN.

## 2024-03-27 NOTE — ED Notes (Signed)
 EMTALA reviewed by this RN.

## 2024-03-27 NOTE — ED Notes (Signed)
 This Patient has a bed Asigned at [facility] not the main hospital going to room 41-31 # to call report is [phone]

## 2024-03-27 NOTE — ED Notes (Signed)
 PT  GOING  TO  [facility]  RM  [id number]  [facility]  WILL TRANSPORT  PT

## 2024-03-27 NOTE — ED Notes (Signed)
 This RN at bedside. Pt awake and oriented to self. BP remains stable. Per Dr. Gordan pt okay to have drink. Pt given coca-cola upon request. Pt also given warm blanket. Pt watching TV at this time.

## 2024-03-27 NOTE — ED Notes (Signed)
 xfer to [facility]

## 2024-03-27 NOTE — H&P (Signed)
 Hospital Medicine Admission History & Physical  Time of Service: 03/27/2024, 5:35 PM  PCP: Jimmy Charlie FERNS, MD, Phone [phone], Fax [phone]   Chief Complaint  AMS  History of Present Illness  18M w/ dysphagia/achalasia (c/b aspiration previously requiring G-tube, patient pulled this and IR/gen surg/GI refused to replace given risk for pulling it again), HTN, CKD (recovered from ESRD requiring iHD), hypoactive/hyperactive delirium, & bladder cancer (multi-invasive urothelial carcinoma c/b b/l hydroureteronephrosis, forniceal rupture, & b/l urinomas, s/p stent, foley catheter, and bilateral PCNs [which patient has pulled out once, then replaced 02/2024], too frail for chemotherapy but attempting to get stronger) transfered from [facility] on 03/27/24 for hypotension requiring pressors, draining R PCN tube (placed back on broad spectrum ABx, and c/f continued Covid+ status (reportedly tested positive).   On review of prior notes, it appears: Patient was hospitalized at [facility] from 8/15 to 02/14/2024 for altered mental status.  Received IV antibiotics and norepinephrine  prior to SNF d/c. He was then hospitalized again 02/2024 with [facility] for problem of pulling out his R PCN and almost pulling out his L PCN, along with UTI requiring meropenem. He was taken to the OR for bilateral PCN replacement under anesthesia. Patient removed his foley after this OR. This was replaced.   On review of ED notes, it appears: Patient was sent back to [place] from skilled nursing facility on 03/27/2024 for concern for infection at nephrostomy site and worsening UTI.  Was on oral antibiotic for UTI.  At [facility] patient was hypotensive to 80/50 improved after initiation of Levophed .  Was started on cefepime , linezolid , meropenem, Flagyl , and vancomycin  given 3.5 L of crystalloid.  Patient was then accepted for transfer to [facility].  Reportedly patient was found to be  COVID-positive as well.  In the ED, patient had: Elevated ED record showed that patient had a lactate of 4.5 that downtrended with fluids and antibiotics as well as some pressors.  On arrival to [facility] is blood pressure stable 120/94.  He had a head CT of the head admits which showed no acute intracranial pathology.  He had a CT abdomen pelvis which showed increased size of multiple nodular densities in the lung bases including a right lower lobe nodule measuring 2.3 cm, increased in size from 1.6 cm previously.  Noncontrasted chest CT was recommended.  Mild subcutaneous stranding around the right nephrostomy catheter without fluid or gas was also noted.  Bilateral percutaneous neph tubes and Foley catheter were in place with trace bilateral hydronephrosis.  Medical History  Past Medical History No past medical history on file.  Past Surgical History Past Surgical History:  Procedure Laterality Date  . CYSTOURETHROSCOPY W/CLOT EVACUATION N/A 12/24/2023   Procedure: CYSTOURETHROSCOPY WITH IRRIGATION AND EVACUATION OF MULTIPLE OBSTRUCTING CLOTS;  Surgeon: Zora Blossom, MD;  Location: [facility];  Service: Urology;  Laterality: N/A;  Need flouroscopy table and xray  . CYSTOURETHROSCOPY W/URETERAL CATHIZATION W/WO RETROGRADE PYELOGRAM Bilateral 12/24/2023   Procedure: CYSTOURETHROSCOPY, WITH URETERAL CATHETERIZATION, WITH OR WITHOUT IRRIGATION, INSTILLATION, OR URETEROPYELOGRAPHY, EXCLUSIVE OF RADIOLOGIC SERVICE;  Surgeon: Zora Blossom, MD;  Location: [facility];  Service: Urology;  Laterality: Bilateral;  . CYSTOURETHROSCOPY W/FULGURATION &/OR RESECTION BLADDER TUMOR N/A 12/24/2023   Procedure: CYSTOURETHROSCOPY, WITH FULGURATION (INCLUDING CRYOSURGERY OR LASER SURGERY) AND/OR RESECTION OF; MEDIUM BLADDER TUMOR(S) (2.0 TO 5.0 CM);  Surgeon: Zora Blossom, MD;  Location: [facility] OR;  Service: Urology;  Laterality: N/A;  . CYSTOURETHROSCOPY W/INSERTION/EXCHANGE  URETERAL STENT Bilateral 12/24/2023  Procedure: CYSTOURETHROSCOPY, WITH INSERTION OF INDWELLING URETERAL STENT (EG,GIBBONS OR DOUBLE-J TYPE);  Surgeon: Zora Blossom, MD;  Location: [facility] OR;  Service: Urology;  Laterality: Bilateral;    Family History Family History  Problem Relation Name Age of Onset  . Cerebral aneurysm Mother      Social History Social History   Socioeconomic History  . Marital status: Divorced  Tobacco Use  . Smoking status: Former    Types: Cigars, Pipe    Quit date: 2015    Years since quitting: 10.8  . Smokeless tobacco: Never  . Tobacco comments:    Patient confirms previously smoking cigars/ pipes. Quit sometime in 2015. 09/11/2023 -[facility]  Substance and Sexual Activity  . Alcohol  use: Never  . Drug use: Never   Social Drivers of Corporate Investment Banker Strain: Patient Unable To Answer (03/08/2024)   Overall Financial Resource Strain (CARDIA)   . Difficulty of Paying Living Expenses: Patient unable to answer  Food Insecurity: Patient Unable To Answer (03/08/2024)   Hunger Vital Sign   . Worried About Programme Researcher, Broadcasting/film/video in the Last Year: Patient unable to answer   . Ran Out of Food in the Last Year: Patient unable to answer  Transportation Needs: Unknown (03/08/2024)   PRAPARE - Transportation   . Lack of Transportation (Medical): Patient unable to answer   . Lack of Transportation (Non-Medical): No    Allergies & Medications   Allergies  Allergen Reactions  . Sulfa (Sulfonamide Antibiotics) Other (See Comments)    Medications Prior to Admission Medications  Prescriptions Last Dose Taking?  QUEtiapine  (SEROQUEL ) 25 MG tablet  No  Sig: Take 0.5 tablets (12.5 mg total) by mouth 2 (two) times daily as needed (Agitation)  QUEtiapine  (SEROquel ) oral suspension 10 mg/mL  No  Sig: Take 1.25 mLs (12.5 mg total) by mouth as directed Daily at 6PM  acetaminophen  (TYLENOL ) 325 MG tablet  No  Sig: Take 3 tablets (975 mg total) by mouth  every 8 (eight) hours as needed for Pain or Fever  albuterol  (PROVENTIL ) 2.5 mg/0.5 mL nebulizer solution  No  Sig: Take 0.5 mLs (2.5 mg total) by nebulization 2 (two) times daily as needed for Shortness of Breath or Wheezing  aspirin  81 MG chewable tablet  No  Sig: Take 81 mg by mouth once daily  caffeine (VIVARIN) 200 mg tablet  No  Sig: Please give HALF of a tablet PO daily at 8 AM.  calcium  carbonate-vitamin D3 250 mg-3 mcg (120 unit) tablet  No  Sig: Take 1 tablet by mouth once daily  cholecalciferol (VITAMIN D3) 1000 unit tablet  No  Sig: Take 1 tablet (1,000 Units total) by mouth once daily  cyanocobalamin  (VITAMIN B12) 100 MCG tablet  No  Sig: Take 1 tablet (100 mcg total) by mouth once daily  enoxaparin  (LOVENOX ) 40 mg/0.4 mL injection syringe  No  Sig: Inject 0.4 mLs (40 mg total) subcutaneously once daily in abdomen, rotating site with each injection. For DVT prophylaxis. May discontinue IF he becomes mobile.  ferrous fumarate 324 mg (106 mg iron) tablet  No  Sig: Take 1 tablet (324 mg total) by mouth daily with breakfast  fluticasone propionate (FLONASE) 50 mcg/actuation nasal spray  No  Sig: Place 1 spray into both nostrils 2 (two) times daily  folic acid  (FOLVITE ) 1 MG tablet  No  Sig: Take 1 tablet (1 mg total) by mouth once daily  lactobacillus rhamnosus GG (CULTURELLE KIDS) 5 billion cell  No  Sig: Take 1 packet by mouth once daily  magnesium oxide (MAG-OX) 400 mg (241.3 mg magnesium) tablet  No  Sig: Take 1 tablet (400 mg total) by mouth 2 (two) times daily  melatonin 1 mg/mL oral solution  No  Sig: Take 3 mLs (3 mg total) by mouth at bedtime  omeprazole  2 mg/mL oral suspension  No  Sig: Take 20 mLs (40 mg total) by mouth once daily  ondansetron  (ZOFRAN -ODT) 4 MG disintegrating tablet  No  Sig: Take 1 tablet (4 mg total) by mouth every 8 (eight) hours as needed for Nausea or Vomiting  thiamine  (VITAMIN B-1) 100 MG tablet  No  Sig: Take 1 tablet (100 mg total) by  mouth once daily    Facility-Administered Medications: None     Review of Systems  A complete review of systems was performed and is negative except as reviewed in the HPI.  Physical Exam    Current Vital Signs 24h Vital Sign Ranges  T 37.2 C (99 F) (03/27/24 1514) Temp  Avg: 37.2 C (99 F)  Min: 37.2 C (99 F)  Max: 37.2 C (99 F)  BP (!) 119/94 (03/27/24 1514) BP  Min: 119/94  Max: 119/94  HR 62 (03/27/24 1514) Pulse  Avg: 62  Min: 62  Max: 62  RR 20 (03/27/24 1514) Resp  Avg: 20  Min: 20  Max: 20  O2sat 99 %   SpO2  Avg: 99 %  Min: 99 %  Max: 99 %  Weight     There is no height or weight on file to calculate BMI. General: NAD. Head: Normocephalic and atraumatic.  Eyes: Conjunctivae are normal. No discharge. No scleral icterus. Mouth/Throat: Mucous membranes moist.  Neck: Normal range of motion.  Cardiovascular: Well perfused  Pulmonary/Chest: No respiratory distress.  Abdomen: No rebound, no guarding. Bilateral PCNs and foley in place.  Musculoskeletal: Normal range of motion. No edema and no tenderness.  Skin: Skin is warm and dry. No rash noted. Scars noted on abdomen.  Heme: no bruising or petechiae Psychiatric: Confused.  Neurologic: A&Ox1.   Data   Recent Results (from the past 24 hours)  ECG 12-lead   Collection Time: 03/27/24  4:00 PM  Result Value Ref Range   Vent Rate (bpm) 52    PR Interval (msec) 140    QRS Interval (msec) 86    QT Interval (msec) 488    QTc (msec) 453    Radiology Studies on Admission: X-ray chest single view portable Result Date: 03/27/2024 Procedure: XR CHEST SINGLE VIEW PORTABLE Indication: Lung Aeration, A41.9 Sepsis, unspecified organism (CMS/HHS-HCC), R65.20 Severe sepsis without septic shock (CMS/HHS-HCC) Comparison: Radiograph of the chest 01/31/2024 Findings: AP view of the chest. Normal cardiomediastinal silhouette. Patchy airspace opacities in the lung bases. No pleural effusion or pneumothorax. No acute osseous abnormality.  IMPRESSION: Patchy airspace opacities in the lung bases. Finding may represent infection or aspiration given clinical history versus atelectasis. Electronically Signed by:  Anil Nagavalli, DO, [facility] Electronically Signed on:  03/27/2024 4:36 PM   Assessment & Plan  Robert Vega is a 83 y.o. male admitted for the following problems: Principal Problem:   UTI (urinary tract infection) Active Problems:   Gastroesophageal reflux disease   Achalasia of esophagus   Dysphagia   Bladder cancer (CMS/HHS-HCC)   Chronic kidney disease   54M w/ dysphagia/achalasia (c/b aspiration previously requiring G-tube, patient pulled this and IR/gen surg/GI refused to replace given risk for pulling it again), HTN, CKD (  recovered from ESRD requiring iHD), hypoactive/hyperactive delirium, & bladder cancer (multi-invasive urothelial carcinoma c/b b/l hydroureteronephrosis, forniceal rupture, & b/l urinomas, s/p stent, foley catheter, and bilateral PCNs [which patient has pulled out once, then replaced 02/2024], too frail for chemotherapy but attempting to get stronger) transfered from [facility] on 03/27/24 for hypotension requiring pressors, draining R PCN tube (placed back on broad spectrum ABx, and c/f continued Covid+ status (reportedly tested positive).   # Multi-Invasive Urothelial Carcinoma # Bilateral PCN Tubes # Draining R PCN tube # B/l Hydroureteronephrosis (w/ Forniceal Rupture + Urinomas) # H/o Recent Aspiration / HAP, E. Faecalis UTI # Recent Complicated UTI +/- Infected Urinomas (s/p CT drain placement L Urinoma 12/2023, removed 01/2024) # Recent VRE Bacteremia Discharge summary from 01/2024 describes recent sepsis requiring pressors and IV antibiotics in the setting of VRE bacteremia and possible infected urinoma's which had a CT drain placement and left urinoma 12/2023.  Drain was then discontinued with improvement in size of urinoma.  Ultimately completed a 4-week course of  linezolid .  Previous CT w/ b/l hydrourerteronephrosis with forniceal rupture, urinomas and enhancing lesion from the bladder base with path demonstrating urothelial carcinoma. Per prior med onc and urology, will need to regain functional status prior to any surgical or systemic therapy. Bilateral PCNs placed 9/5 due to stent failure, foley catheter removed.   Patient has had 3 hospitalizations in the past 2 months. His course is c/b hypo/hyperactive delirium and he has pulled his G-tube out, and PCN tubes out on a few occasions. He is only able to tolerate dysphagia diet and it is not clear that he will become strong enough to tolerate chemotherapy.   He was sent to [facility] ED from SNF on 10/30 for c/f continued infection, draining R PCN tube, and hypotension. He required pressors, meropenem, and linzeolid. He was noted to be Covid positive but tested positive 3 weeks ago.   Overall, admitter feels that patient had poor intake at SNF given dysphagia diet and debilitation from underlying cancer and his failure to thrive.  Continued infection is also possible, so we will treat with meropenem and linezolid .  Will involve infectious diseases, urology, and obtain updated imaging.  Extensive goals of care performed with patient's HCPOA on admission, described in ACP note.  - Infectious diseases consult in the AM - Urology consult in the AM - Meropenem - Linezolid  - Fluid plan: --- 10/31: Encouraging oral hydration --- 11/1: Order fluids based on AM labs - Call [place] [facility] for blood culture results. - Repeat blood cultures obtained at [facility] on arrival.  - UCx ordered on admission to [facility] - f/u CTAP non-con - PCN tubes clamped on admission --- F/u w/ Urology regarding when to unclamp - Foley catheter in place on admission --- Patient does not typically have this, placed in [place] while PCN tubes were clamped - tele - bladder scans prn  # EKG  Abnormality Previously in sinus, EKG on admission to [facility] showing possible atrial flutter with variable AV block, but poor quality. Rate controled to 57.  - f/u AM EKG - DVT ppx ordered - tele - make decisions pending AM EKG  # Covid Positive Status Patient tested positive for COVID at home at regional hospital on 03/26/2024. Patient on RA on admission.  - Isolation precautions ordered - Repeat Covid test ordered at [facility] - can discuss with infection prevention when to d/c precautions - duonebs available  # Goals of Care Goals of care  discussions are documented during prior hospital stays.  Goal is to strengthen patient for potential chemotherapy candidacy.  Patient's HCPOA is his granddaughter and realizes that he may not become strong enough for this.   On arrival to [facility] 10/31 patient arrived with a MOST form which was completed on 03/11/2024.  This form indicates that patient would want CPR in the event of near arrest, and would want full scope of treatment including antibiotics and fluids.  Extensive goals of care performed with HCPOA on admission to [facility] documented in separate ACP note. In summary, patient remains full code but family will begin ACP discussions. When patient is lucid he seems to request full measures.   - Full code - Would get palliative care involved 11/3  # Pulmonary Nodules # Small L Pleural Effusion He had a CT abdomen pelvis which showed increased size of multiple nodular densities in the lung bases including a right lower lobe nodule measuring 2.3 cm, increased in size from 1.6 cm previously.  Noncontrasted chest CT was recommended.  Small left pleural effusion was also noted. - f/u noncontrasted chest CT  # Intermittent Confusion # Intermittent Hypercarbic Respiratory Failure # Hypoactive/Hyperactive Delirium Noted during recent hospitalizations.  Geriatrics managed with caffeine 100 mg every morning, trazodone  25 mg nightly, melatonin at night.   Also added Seroquel . - caffeine 100mg  daily - calcium -vitamin D  daily - B12 daily - folic acid  - magnesium - thiamine  - melatonin - seroquel  12.5mg  daily at 6pm - seroquel  prn for agitation - zyprexa  IM second line - RSO in place - PT/OT  # HTN # HLD - home ASA held in case of PCN tube adjustment or removal - not on antihypertensives  # CKD (recovered from ESRD, previously on iHD) Developed ESRD requiring dialysis secondary to obstruction from ureteric cancer, now improved after stent placement.  Previously on diuretics. - no longer on torsemide - trend renal labs - I/Os - bladder scans prn - avoid nephrotoxics as able  # H/o Anemia (previously requiring transfusions) Which previously required transfusions in the setting of renal disease which previously required dialysis. - home iron - trend CBC  # Malnutrition # Dysphagia / Achalasia (s/p G-tube, now removed) Longstanding history with malnutrition, previously required G-tube for feeds, now after G-tube was removed HCPOA requested for placement of the G-tube but IR, GI, and general surgery are refused to do this given high risk for removal. - PPI  Comorbid Conditions    VTE Prophylaxis  + Anticoagulant Ordered  [START ON 03/28/2024] enoxaparin , 40 mg, Subcutaneous, Daily      Code Status: Full Code  Patient Class & Status: Inpatient, Intermediate  Discharge Planning: PT/OT pending    WILLIA FOUNTAIN, MD  [facility] 03/27/2024 5:35 PM  Reviewed records from the following unique sources (external institutions/providers from different services within [facility]): [place] regional ED notes, prior ICU notes, prior case management notes, Reviewed the following unique tests: lactate, covid test, CMP, CBC, Mg, Ordered the following labs/studies/tests: EKG, CT abdomen pelvis, CT chest, all prior hospital, and Assessment required an independent historian and additional information obtained from [facility]  granddaughter, Independently interpreted test EKG which shows from [facility], A flutter with variable AV block, and Discussed management of severe UTI with granddaughter, nursing, The patient is being intensively monitored for drug toxicity from meropenem using labs/EKG, and Treating the patient for severe UTI previously on pressors, now on meropenem/linezolid  that poses a threat to life or bodily functions. This increases the complexity of  data to be reviewed and analyzed and the risk of complications/mortality/morbidity of patient management

## 2024-03-27 NOTE — ED Provider Notes (Addendum)
 Care assumed of patient from outgoing provider.  See their note for initial history, exam and plan.  Clinical Course as of 03/27/24 1148  Fri Mar 27, 2024  0032 Patient is maintaining blood pressure of about 120/70 off of pressors.  Repeat lactic acid is 1.5.  Urinalysis is strongly positive for infection.  I reach back out to [facility] transfer center who expressed reluctance about transfer to a non-ICU level of care, but I reiterated multiple times that the patient has been resuscitated over 10-1/2 hours in our emergency department and is stable for transfer.  They will reach out to their charge nurse and to the providers and determine whether they are comfortable with transfer. [CF]  0145 Still awaiting callback from [facility] [CF]  0145 Sepsis reassessment complete.  Patient maintaining MAP of approx 71 off pressors. [CF]  0219 Consulted with Dr. Paulette with the [facility] hospitalist team.  We discussed the case in detail and he accepted the patient on behalf of Dr. Bernardino Daring at [facility] where he said they should all of the services needed including urology.  They are now looking for a stepdown bed and the transfer center will call back when a bed has been assigned.  Of note, the patient is maintaining his blood pressure, most recent BP is 114/55 which is a MAP of 72.  Patient hemodynamically stable for transfer. [CF]  0441 Sepsis reassessment complete.  Patient remains stable, was recently awake and alert and tolerating PO fluids.  Still awaiting bed assignment at [facility]. [CF]  0509 Patient's blood pressure (MAP) downtrending slightly but he is asleep.  Given his continued stay in the emergency department awaiting transfer, I ordered normal saline 150 mL/h infusion to keep him hydrated particularly in the setting of his acute renal failure [CF]  0706 Transferring ED care to Dr. Suzanne while the patient continues to await a bed at [facility]. [CF]  9287 History of bladder cancer and b/l  neph tubes - all care is at [facility]. Sepsis and covid +. UA + with stranding on CT w/. IV abx. Neph tubes are not draining. AKI with concern for obstruction.  Hypotension and elevated LA.  IVF 3.5L, levophed , given stress dose steroids.  No longer on levophed . LA now 1.5 Plan for [facility] transfer.  [SM]    Clinical Course User Index [CF] Gordan Huxley, MD [SM] Suzanne Kirsch, MD   Patient well-appearing today.  States he is feeling much better.  Discussed with pharmacy who will schedule his antibiotics.  Hemodynamically stable and waiting for transfer to [facility]  Transfer to [facility]   Suzanne Kirsch, MD 03/27/24 1001    Suzanne Kirsch, MD 03/27/24 [phone]

## 2024-03-28 LAB — BLOOD CULTURE ID PANEL (REFLEXED) - BCID2

## 2024-03-28 NOTE — Care Plan (Signed)
  Problem: All patients positive for delirium Goal: Patient will remain safe Outcome: Progressing   Problem: Disorientation Goal: The patient will be oriented to person, time, or place in the environment Outcome: Progressing   Problem: Risk for Injury related to restraint use Goal: Remains free from restraints Outcome: Progressing Goal: Patient will remain free from injury while in restraints Outcome: Progressing   Problem: Fall Risk Prevention for Adult Populations Goal: High Risk Fall Prevention - Adults Description: The patient will remain free from falls Outcome: Progressing

## 2024-03-28 NOTE — ED Provider Notes (Signed)
 Notified by charge nurse that patient's blood cultures were growing gram-negative rods.  Reviewed culture data, GNR are growing in the anaerobic bottle for both sets of blood cultures.  ID positive for Enterobacterales as well as E. coli.  Also had a urine culture sent that is growing 20,000 units of E. coli as well as greater than 100,000 units of Enterococcus faecium.  I did review patient's chart and note that patient was transferred to [facility] for further management.  I did request that the transfer center be contacted to update the patient's care team.  I spoke with Dr. Tod with the hospitalist team at [facility].  He was updated on the patient's culture data, further management per [facility] team.    Levander Slate, MD 03/28/24 1225

## 2024-03-28 NOTE — ED Notes (Signed)
 Blood Culture report given to Dr. Levander to review.

## 2024-03-29 LAB — URINE CULTURE: Culture: 20000 — AB

## 2024-03-29 NOTE — Progress Notes (Signed)
 INFECTIOUS DISEASES CONSULTATION NOTE   Admit Date: 03/27/2024 Date of Initial Consult: 03/28/24 Date of Follow-Up: 03/29/2024   Service Requesting Consult: Internal Medicine Attending Physician requesting Consult: Alfonza Norleen Gambler, MD Reason for consult: UTI Current Hospital admission day: Hospital Day: 3  SUBJECTIVE   HISTORY OF PRESENT ILLNESS: History was obtained from Chart Review and Patient Interview Robert Vega is a 83 y.o. male w/ dysphagia/achalasia c/b aspiration, HTN, CKD4, bladder cancer c/b hydroureteronephrosis, forniceal rupture and b/l urinomas s/p stent, bilateral PCN, not chemotherapy candidate who was transferred from [facility] for hypotension requiring pressors and possible COVID  RELEVANT COURSE: 10/05/23 - 12/16/23: Admitted to [facility] with acute hypoxic respiratory failure c/b cardiac arrest requiring CPR. Underwent PEG placement, which pt pulled out while encephalopathic.   12/16/23 - 01/06/24: Admitted to [facility] with acute encephalopathy. Diagnosed with invasive urothelial carcinoma of the bladder. Felt to not be chemotherapy candidate. Course complicated by forniceal rupture and multiple urinomas. Was told by medical team that he is dying from bladder cancer and is not surgical/chemo candidate. Recommended palliative care, family declined. Discharged off of antibiotics  01/09/24 - 01/10/24: Admitted to [facility] with septic shock 2/2 VRE bacteremia. Left AMA to self-transfer to [facility]  01/10/24 - 02/14/24: Admitted to [facility] with VRE bacteremia due to infected urinoma. Required bilateral PCN placement. G-tube removed by patient twice, GI declined to replace given potential for injury. Treated with linezolid  and Zosyn . Drain placed in left urinoma, which grew VRE but was removed prematurely when patient pulled it out on 09/01. Treated with linezolid  given VRE SDD for daptomycin. CT A/P on 09/05 showed perssitent RP collection (largest  component 2.8 x 6.3 cm). Completed 4 weeks of treatment on 09/12.   03/08/13 - 03/11/23: Admitted to [facility] after he pulled out PCNs. Treated with meropenem and linezolid . PCNs replaced.   03/26/24: Presented to [facility] with septic shock. Initially required pressors but was weaned off after 3.5L IVF.COVID+. Received linezolid , vancomycin , cefepime , meropenem and metronidazole  during ED stay. UA with >50 WBCs, unclear where from. Foley was placed for unclear reasons. Blood culture positive for E. coli, urine culture positive for VRE/E. Coli. Non-contrast CT with mild subcutaneous strranding around right nephrostomy. Trace bilateral hydronephrosis and mild perinephric stranding (stable.  03/27/24 - present: Transferred from [facility] to [facility]. On arrival T35, SBP in 110s. CT C/A/P without contrast with nodular opacities in both lungs c/f septic emboli, atypical infection, multifocal pneumonia or metastases. No hydronephrosis. He was continued on meropenem and linezolid . He has bilateral nephrostomy tubes and a foley. UA with >182 WBCs (unclear where from)  Interval events: - No acute events overnight - Afebrile, clinically stable -  Blood/urine cultures from [facility] - He tells me he is in no pain. Otherwise nonsensical conversation with him  CURRENT ANTIBIOTICS: Fever Curve and Antibiotics (Current Encounter) <redacted file path> Linezolid : 10/30 - present Meropenem: 10/30 - present  REVIEW OF SYSTEMS:  A ROS was performed including pertinent positive and negatives as documented.  All other systems are negative.  MEDICAL HISTORY: Patient Active Problem List  Diagnosis  . Gastrostomy status (CMS/HHS-HCC)  . Gastroesophageal reflux disease  . Achalasia of esophagus  . Encephalopathy  . Dysphagia  . Perinephric hematoma  . Sepsis (CMS/HHS-HCC)  . Bladder cancer (CMS/HHS-HCC)  . Gross hematuria  . Chronic kidney disease  . AKI (acute kidney injury)  . UTI (urinary tract  infection)   No past medical history on file.  Past Surgical History:  Procedure Laterality Date  . CYSTOURETHROSCOPY W/CLOT EVACUATION N/A 12/24/2023   Procedure: CYSTOURETHROSCOPY WITH IRRIGATION AND EVACUATION OF MULTIPLE OBSTRUCTING CLOTS;  Surgeon: Zora Blossom, MD;  Location: [facility] OR;  Service: Urology;  Laterality: N/A;  Need flouroscopy table and xray  . CYSTOURETHROSCOPY W/URETERAL CATHIZATION W/WO RETROGRADE PYELOGRAM Bilateral 12/24/2023   Procedure: CYSTOURETHROSCOPY, WITH URETERAL CATHETERIZATION, WITH OR WITHOUT IRRIGATION, INSTILLATION, OR URETEROPYELOGRAPHY, EXCLUSIVE OF RADIOLOGIC SERVICE;  Surgeon: Zora Blossom, MD;  Location: [facility] OR;  Service: Urology;  Laterality: Bilateral;  . CYSTOURETHROSCOPY W/FULGURATION &/OR RESECTION BLADDER TUMOR N/A 12/24/2023   Procedure: CYSTOURETHROSCOPY, WITH FULGURATION (INCLUDING CRYOSURGERY OR LASER SURGERY) AND/OR RESECTION OF; MEDIUM BLADDER TUMOR(S) (2.0 TO 5.0 CM);  Surgeon: Zora Blossom, MD;  Location: [facility] OR;  Service: Urology;  Laterality: N/A;  . CYSTOURETHROSCOPY W/INSERTION/EXCHANGE URETERAL STENT Bilateral 12/24/2023   Procedure: CYSTOURETHROSCOPY, WITH INSERTION OF INDWELLING URETERAL STENT (EG,GIBBONS OR DOUBLE-J TYPE);  Surgeon: Zora Blossom, MD;  Location: [facility] OR;  Service: Urology;  Laterality: Bilateral;   Allergies  Allergen Reactions  . Sulfa (Sulfonamide Antibiotics) Other (See Comments)   Family History  Problem Relation Age of Onset  . Cerebral aneurysm Mother    Immunization History  Administered Date(s) Administered  . COVID-19 Pfizer Monovalent Vaccine (original formulation) 03/23/2020   SOCIAL HISTORY: Social History   Socioeconomic History  . Marital status: Divorced  Tobacco Use  . Smoking status: Former    Types: Cigars, Pipe    Quit date: 2015    Years since quitting: 10.8  . Smokeless tobacco: Never  . Tobacco comments:    Patient confirms previously  smoking cigars/ pipes. Quit sometime in 2015. 09/11/2023 -[facility]  Substance and Sexual Activity  . Alcohol  use: Never  . Drug use: Never   Social Drivers of Corporate Investment Banker Strain: Patient Unable To Answer (03/08/2024)   Overall Financial Resource Strain (CARDIA)   . Difficulty of Paying Living Expenses: Patient unable to answer  Food Insecurity: Patient Unable To Answer (03/08/2024)   Hunger Vital Sign   . Worried About Programme Researcher, Broadcasting/film/video in the Last Year: Patient unable to answer   . Ran Out of Food in the Last Year: Patient unable to answer  Transportation Needs: No Transportation Needs (03/28/2024)   PRAPARE - Transportation   . Lack of Transportation (Medical): No   . Lack of Transportation (Non-Medical): No  Social Connections: Unknown (10/06/2023)   Received from [facility]   Social Connection and Isolation Panel   . In a typical week, how many times do you talk on the phone with family, friends, or neighbors?: Patient unable to answer   . How often do you get together with friends or relatives?: Patient unable to answer   . How often do you attend church or religious services?: Patient unable to answer   . Do you belong to any clubs or organizations such as church groups, unions, fraternal or athletic groups, or school groups?: Patient unable to answer   . How often do you attend meetings of the clubs or organizations you belong to?: Patient unable to answer  Housing Stability: Patient Unable To Answer (03/08/2024)   Housing Stability Vital Sign   . Unable to Pay for Housing in the Last Year: Patient unable to answer   . Number of Times Moved in the Last Year: 0   . Homeless in the Last Year: Patient unable to answer   CURRENT MEDICATIONS: Medication List & Administration  Record reviewed on rounds Current Facility-Administered Medications  Medication Dose Route Frequency Provider Last Rate Last Admin  . acetaminophen  (TYLENOL ) tablet 650 mg  650 mg Oral Q6H PRN  Sivaraj, Krishan, MD      . caffeine (VIVARIN) tablet 100 mg  100 mg Oral Daily Sivaraj, Krishan, MD   100 mg at 03/29/24 0958  . calcium  carbonate-vitamin D3 (CALTRATE 600+D) 600 mg-10 mcg (400 unit) tablet 1 tablet  1 tablet Oral Daily with breakfast Sivaraj, Krishan, MD   1 tablet at 03/29/24 0930  . cholecalciferol (VITAMIN D3) tablet 1,000 Units  1,000 Units Oral Daily Sivaraj, Krishan, MD   1,000 Units at 03/29/24 0954  . cyanocobalamin  (VITAMIN B12) tablet 100 mcg  100 mcg Oral Daily Sivaraj, Krishan, MD   100 mcg at 03/29/24 0954  . ferrous fumarate tablet 324 mg  324 mg Oral Daily with breakfast Sivaraj, Krishan, MD   324 mg at 03/29/24 0900  . folic acid  (FOLVITE ) tablet 1 mg  1 mg Oral Daily Sivaraj, Krishan, MD   1 mg at 03/29/24 0954  . heparin  (porcine) injection 5,000 Units  5,000 Units Subcutaneous Q8H SCH Alfonza Norleen Gambler, MD   5,000 Units at 03/29/24 [id number]  . ipratropium-albuteroL  (DUO-NEB) nebulizer solution 3 mL  3 mL Nebulization QID PRN Sivaraj, Krishan, MD      . lidocaine  (SALONPAS) 4 % patch 1 patch  1 patch Transdermal Daily PRN Sivaraj, Krishan, MD      . lidocaine  (XYLOCAINE ) 1 % injection 0.5 mL  0.5 mL Subcutaneous As Directed Sivaraj, Krishan, MD      . linezolid  in dextrose  5% (ZYVOX ) 600 mg/300 mL IVPB 600 mg  600 mg Intravenous Q12H [facility] Sivaraj, Krishan, MD 300 mL/hr at 03/29/24 1005 600 mg at 03/29/24 1005  . magnesium oxide (MAG-OX) tablet 400 mg  400 mg Oral BID Sivaraj, Krishan, MD   400 mg at 03/29/24 1028  . magnesium oxide (MAG-OX) tablet 800 mg  800 mg Oral Once Alfonza Norleen Gambler, MD      . melatonin tablet 3 mg  3 mg Oral QHS Sivaraj, Krishan, MD   3 mg at 03/28/24 2109  . meropenem (MERREM) 500 mg in sodium chloride  0.9 % 100 mL IVPB  500 mg Intravenous Q8H Alfonza Norleen Gambler, MD 200 mL/hr at 03/29/24 0423 500 mg at 03/29/24 0423  . OLANZapine  (ZyPREXA ) injection 5 mg  5 mg Intramuscular Daily PRN Sivaraj, Krishan, MD   5 mg at 03/28/24 0218  .  omeprazole  (PRILOSEC) 2 mg/mL oral suspension 40 mg  40 mg Oral Daily Sivaraj, Krishan, MD   40 mg at 03/29/24 9047  . ondansetron  (PF) (ZOFRAN ) injection 4 mg  4 mg Intravenous Daily PRN Sivaraj, Krishan, MD      . ondansetron  (ZOFRAN -ODT) disintegrating tablet 4 mg  4 mg Oral Q8H PRN Sivaraj, Krishan, MD      . polyethylene glycol (MIRALAX ) packet 17 g  17 g Oral Daily PRN Sivaraj, Krishan, MD      . QUEtiapine  (SEROquel ) tablet 12.5 mg  12.5 mg Oral BID PRN Sivaraj, Krishan, MD   12.5 mg at 03/28/24 0830  . QUEtiapine  (SEROquel ) tablet 12.5 mg  12.5 mg Oral Daily Alfonza Norleen Gambler, MD   12.5 mg at 03/28/24 1613  . sennosides-docusate (SENOKOT-S) 8.6-50 mg tablet 2 tablet  2 tablet Oral QHS PRN Sivaraj, Krishan, MD      . simethicone  (MYLICON) chewable tablet 80 mg  80 mg Oral TID PRN Sivaraj, Krishan,  MD      . sodium chloride  0.9% infusion   Intravenous Continuous Alfonza Norleen Gambler, MD      . thiamine  (Vitamin B-1) tablet 100 mg  100 mg Oral Daily Sivaraj, Krishan, MD   100 mg at 03/29/24 0954   PHYSICAL EXAM   PHYSICAL EXAMINATION: Vitals:   03/29/24 0752  BP: 135/80  Pulse: 75  Resp: 18  Temp: 36.2 C (97.2 F)   Temp (24hrs), Avg:36.8 C (98.2 F), Min:36.2 C (97.2 F), Max:37.2 C (98.9 F)  Ht Readings from Last 1 Encounters:  03/08/24 177.8 cm (5' 10)   Wt Readings from Last 1 Encounters:  03/29/24 86.7 kg (191 lb 2.2 oz)   I/O last 3 completed shifts: In: 1351.7 [P.O.:310; I.V.:1041.7] Out: 2800 [Urine:2800]  GEN Awake  EYES PERRL, anicteric sclera, no conjunctival injection or exudate  HENT Normocephalic, no nasal discharge, MMM, oral cavity and pharynx normal without inflammation, swelling or lesions.   CV Unable to auscultate with isolation scope (hearing impaired)  CHEST No increased WOB. Unable to auscultate with isolation scope (hearing impaired)  ABD Soft, non-distended, non-tender. No guarding or rebound. No palpable masses  EXT No significant  deformity, warm and well perfused, no edema  SKIN Skin normal color and turgor  NEURO CN grossly intact  PSYCH Answers questions appropriately  Tubes  R/L PCN without surrounding erythema. Foley draining yellow urine     LINES/DRAINS/TUBES: Patient Lines/Drains/Airways Status     Active PICC Line / CVC Line / PIV Line / Drain / Intraosseous Line / ART Line / Line / NG/OG Tube     Name Placement date Placement time Site Days   Peripheral IV Right;Posterior Forearm --  --  Forearm  --   Peripheral IV Left Antecubital --  --  Antecubital  --   Nephrostomy Right 01/31/24  --  Right  58   Nephrostomy Left 01/31/24  --  Left  58   Nephrostomy Left 6 french 03/08/24  1916  Left  21   Nephrostomy Right 6 french 03/08/24  1912  Right  21   Urethral Catheter --  --  --  --           OBJECTIVE   LABORATORY STUDIES: Lab Results  Component Value Date   WBC 7.2 03/29/2024   HGB 8.7 (L) 03/29/2024   PLT 231 03/29/2024   Lab Results  Component Value Date   NA 137 03/29/2024   K 3.0 (L) 03/29/2024   CL 102 03/29/2024   CO2 23 03/29/2024   CO2 25 03/28/2024   BUN 23 (H) 03/29/2024   CREATININE 1.4 (H) 03/29/2024   CALCIUM  7.5 (L) 03/29/2024   ALKPHOS 85 03/28/2024   ALT 14 (L) 03/28/2024   AST 26 03/28/2024   GLUCOSE 76 03/29/2024   Estimated Creatinine Clearance: 39.8 mL/min (A) (based on SCr of 1.4 mg/dL (H)). - Adjusted Ideal Body Weight CrCl Range (Ideal BW, Actual BW): (39.8, 47.3) ml/min     MICROBIOLOGY: Culture Data w/ Susceptibilities (1 year) <redacted file path> 03/26/24: Urine culture (From [facility])   PATHOLOGY: Relevant data (if any) personally reviewed in [place].  EKG (QTc): Lab Results  Component Value Date   QTC 448 03/28/2024   QTC 461 03/27/2024   QTC 449 01/31/2024    IMAGING/RADIOLOGY: Relevant data (if any) personally reviewed in [place].  TTE/TEE:  Each new and available test documented above been reviewed at time of  consult/follow-up. ASSESSMENT & RECOMMENDATIONS   ASSESSMENT: Robert Vega is a 83 y.o. male w/ dysphagia/achalasia c/b aspiration, HTN, CKD4, bladder cancer c/b hydroureteronephrosis, forniceal rupture and b/l urinomas s/p stent, bilateral PCN, not chemotherapy candidate who was transferred from [facility] for hypotension requiring pressors and possible COVID  # Septic shock, resolved # History of VRE bacteremia # History of infected urinoma # Metastatic urothelial carcinoma of bladder # E. coli bacteremia Pt with complicated urologic history who has spent the majority of the past 6 months in hospital. He was treated in August for VRE bacteremia with source confirmed to be infected urinoma (VRE isolated on percutaneous drainage). He completed 4 weeks of treatment with linezolid  on 09/12. Although resolution of his infected RP urinoma was never confirmed radiographically and he removed his percutaneous drain prematurely. He was transferred from [facility] after presenting with septic shock requiring pressors with blood cultures growing E. coli and urine culture (unclear if from newly placed foley or old PCNs) growing >100k CFU VRE and 20k CFU E. coli. After resuscitation, he is now clinically stable on linezolid  and meropenem.   The source is likely his urinary tract, although not completely clear why he experienced bacteremia. Foley was placed at [facility] for unclear reasons, although urine appeared to be leaking around the PCN and experienced no urine output from PCN per nursing notes.  His infected urinomas were inadequately drained previously due to the patient self-removing his percutaneous drain, which would be a possible source. The most likely option based on the information we have is malpositioned PCN (due to reports of inadequate output at [facility]) causing obstruction and translocation into bloodstream. The PCN insertion sites look fine to me without any surrounding cellulitis,  but there was some soft tissue stranding noted around R PCN site noted on outside non-contrast CT A/P  We should repeat contrasted CT A/P when able to ensure urinoma has resolved.   He has new peripheral nodular opacities in his bilateral lungs. This is the size and distribution commonly seen with septic emboli, although with E. coli in the blood, this would be unlikely. Another possibility is metastases from his bladder malignancy.  RECOMMENDATIONS: - I would switch IV linezolid  to PO given no VRE in blood cultures at 48h - Continue meropenem 500 mg q8h for now, anticipate we can switch tomorrow once AST from [facility] returns (I called today, should result by Monday) - CT A/P with contrast when able - Discuss with radiology to ensure PCNs are appropriately positioned. Consider exchange unless we have another source of his BSI - TTE to evaluate for endocarditis given c/f septic emboli - Follow-up blood culture AST from [facility] - Follow-up blood cultures from [facility] - persistently positive blood cultures would increase suspicion for IE  Estimated Creatinine Clearance: 39.8 mL/min (A) (based on SCr of 1.4 mg/dL (H)).   ID will continue to follow Please page [facility] ID Team 2 [phone]) with questions or change in clinical status. PROBABLE NEED FOR OUTPATIENT ID FOLLOW-UP: Unclear at this time  Infectious Diseases is available in-house from 8A-5P. If urgent assistance is needed outside of these hours, please page the Infectious Diseases Team Pager assigned to the patient for assistance. The Infectious Diseases consult pagers are available after business hours for emergencies only.  Any IV antimicrobials listed above require regular monitoring for toxicity. Recommendations as documented above and interpretation of above test results discussed with primary service.  Fonda Morene Bach, MD

## 2024-03-30 LAB — CULTURE, BLOOD (ROUTINE X 2)
Culture  Setup Time: NO GROWTH
Culture  Setup Time: NO GROWTH
Special Requests: ADEQUATE

## 2024-04-27 DEATH — deceased
